# Patient Record
Sex: Male | Born: 1950
Health system: Southern US, Community
[De-identification: ages and names within clinical notes are randomized; demographics above are authoritative.]

## PROBLEM LIST (undated history)

## (undated) DIAGNOSIS — Z8619 Personal history of other infectious and parasitic diseases: Secondary | ICD-10-CM

## (undated) DIAGNOSIS — M109 Gout, unspecified: Secondary | ICD-10-CM

## (undated) DIAGNOSIS — I509 Heart failure, unspecified: Secondary | ICD-10-CM

## (undated) DIAGNOSIS — I10 Essential (primary) hypertension: Secondary | ICD-10-CM

## (undated) DIAGNOSIS — M5030 Other cervical disc degeneration, unspecified cervical region: Secondary | ICD-10-CM

## (undated) DIAGNOSIS — C61 Malignant neoplasm of prostate: Secondary | ICD-10-CM

## (undated) DIAGNOSIS — K5790 Diverticulosis of intestine, part unspecified, without perforation or abscess without bleeding: Secondary | ICD-10-CM

## (undated) DIAGNOSIS — K589 Irritable bowel syndrome without diarrhea: Secondary | ICD-10-CM

## (undated) DIAGNOSIS — J189 Pneumonia, unspecified organism: Secondary | ICD-10-CM

## (undated) DIAGNOSIS — Q159 Congenital malformation of eye, unspecified: Secondary | ICD-10-CM

## (undated) DIAGNOSIS — G4730 Sleep apnea, unspecified: Secondary | ICD-10-CM

## (undated) DIAGNOSIS — M069 Rheumatoid arthritis, unspecified: Secondary | ICD-10-CM

## (undated) DIAGNOSIS — J449 Chronic obstructive pulmonary disease, unspecified: Secondary | ICD-10-CM

## (undated) DIAGNOSIS — R569 Unspecified convulsions: Secondary | ICD-10-CM

## (undated) DIAGNOSIS — J45909 Unspecified asthma, uncomplicated: Secondary | ICD-10-CM

## (undated) DIAGNOSIS — R600 Localized edema: Secondary | ICD-10-CM

## (undated) DIAGNOSIS — M1990 Unspecified osteoarthritis, unspecified site: Secondary | ICD-10-CM

## (undated) DIAGNOSIS — D689 Coagulation defect, unspecified: Secondary | ICD-10-CM

## (undated) DIAGNOSIS — E7800 Pure hypercholesterolemia, unspecified: Secondary | ICD-10-CM

## (undated) DIAGNOSIS — I82409 Acute embolism and thrombosis of unspecified deep veins of unspecified lower extremity: Secondary | ICD-10-CM

## (undated) DIAGNOSIS — R609 Edema, unspecified: Secondary | ICD-10-CM

## (undated) DIAGNOSIS — D126 Benign neoplasm of colon, unspecified: Secondary | ICD-10-CM

## (undated) DIAGNOSIS — F419 Anxiety disorder, unspecified: Secondary | ICD-10-CM

## (undated) DIAGNOSIS — K219 Gastro-esophageal reflux disease without esophagitis: Secondary | ICD-10-CM

## (undated) DIAGNOSIS — R011 Cardiac murmur, unspecified: Secondary | ICD-10-CM

## (undated) DIAGNOSIS — IMO0001 Reserved for inherently not codable concepts without codable children: Secondary | ICD-10-CM

## (undated) HISTORY — DX: Benign neoplasm of colon, unspecified: D12.6

## (undated) HISTORY — DX: Unspecified asthma, uncomplicated: J45.909

## (undated) HISTORY — DX: Coagulation defect, unspecified: D68.9

## (undated) HISTORY — DX: Cardiac murmur, unspecified: R01.1

## (undated) HISTORY — DX: Pure hypercholesterolemia, unspecified: E78.00

## (undated) HISTORY — PX: CIRCUMCISION: SUR203

## (undated) HISTORY — DX: Anxiety disorder, unspecified: F41.9

## (undated) HISTORY — DX: Gout, unspecified: M10.9

## (undated) HISTORY — DX: Malignant neoplasm of prostate: C61

## (undated) HISTORY — DX: Sleep apnea, unspecified: G47.30

## (undated) HISTORY — DX: Rheumatoid arthritis, unspecified: M06.9

## (undated) HISTORY — PX: POLYPECTOMY: SHX149

## (undated) HISTORY — PX: PROSTATE BIOPSY: SHX241

## (undated) HISTORY — PX: COLONOSCOPY: SHX174

## (undated) HISTORY — PX: CHOLECYSTECTOMY: SHX55

---

## 1999-01-07 ENCOUNTER — Emergency Department (HOSPITAL_COMMUNITY): Admission: EM | Admit: 1999-01-07 | Discharge: 1999-01-07 | Payer: Self-pay | Admitting: Emergency Medicine

## 1999-01-18 ENCOUNTER — Encounter: Payer: Self-pay | Admitting: Surgery

## 1999-01-22 ENCOUNTER — Ambulatory Visit (HOSPITAL_COMMUNITY): Admission: RE | Admit: 1999-01-22 | Discharge: 1999-01-23 | Payer: Self-pay | Admitting: Surgery

## 1999-01-22 ENCOUNTER — Encounter: Payer: Self-pay | Admitting: Surgery

## 2000-01-14 ENCOUNTER — Emergency Department (HOSPITAL_COMMUNITY): Admission: EM | Admit: 2000-01-14 | Discharge: 2000-01-14 | Payer: Self-pay | Admitting: Emergency Medicine

## 2000-01-14 ENCOUNTER — Encounter: Payer: Self-pay | Admitting: Emergency Medicine

## 2000-01-31 ENCOUNTER — Other Ambulatory Visit: Admission: RE | Admit: 2000-01-31 | Discharge: 2000-01-31 | Payer: Self-pay | Admitting: Gastroenterology

## 2000-01-31 ENCOUNTER — Encounter (INDEPENDENT_AMBULATORY_CARE_PROVIDER_SITE_OTHER): Payer: Self-pay | Admitting: Specialist

## 2002-01-11 ENCOUNTER — Encounter: Payer: Self-pay | Admitting: Urology

## 2002-01-11 ENCOUNTER — Encounter: Admission: RE | Admit: 2002-01-11 | Discharge: 2002-01-11 | Payer: Self-pay | Admitting: Urology

## 2002-01-13 ENCOUNTER — Ambulatory Visit (HOSPITAL_BASED_OUTPATIENT_CLINIC_OR_DEPARTMENT_OTHER): Admission: RE | Admit: 2002-01-13 | Discharge: 2002-01-13 | Payer: Self-pay | Admitting: Urology

## 2004-11-08 ENCOUNTER — Emergency Department (HOSPITAL_COMMUNITY): Admission: EM | Admit: 2004-11-08 | Discharge: 2004-11-08 | Payer: Self-pay | Admitting: Family Medicine

## 2005-03-04 ENCOUNTER — Ambulatory Visit: Payer: Self-pay | Admitting: Cardiology

## 2005-03-13 ENCOUNTER — Ambulatory Visit (HOSPITAL_COMMUNITY): Admission: RE | Admit: 2005-03-13 | Discharge: 2005-03-13 | Payer: Self-pay | Admitting: Family Medicine

## 2005-03-17 ENCOUNTER — Emergency Department (HOSPITAL_COMMUNITY): Admission: EM | Admit: 2005-03-17 | Discharge: 2005-03-17 | Payer: Self-pay | Admitting: Emergency Medicine

## 2005-04-29 ENCOUNTER — Inpatient Hospital Stay (HOSPITAL_COMMUNITY): Admission: RE | Admit: 2005-04-29 | Discharge: 2005-05-03 | Payer: Self-pay | Admitting: Family Medicine

## 2006-03-20 ENCOUNTER — Emergency Department (HOSPITAL_COMMUNITY): Admission: EM | Admit: 2006-03-20 | Discharge: 2006-03-20 | Payer: Self-pay | Admitting: Emergency Medicine

## 2006-03-24 ENCOUNTER — Ambulatory Visit (HOSPITAL_COMMUNITY): Admission: RE | Admit: 2006-03-24 | Discharge: 2006-03-24 | Payer: Self-pay | Admitting: Family Medicine

## 2007-02-10 ENCOUNTER — Ambulatory Visit (HOSPITAL_COMMUNITY): Admission: RE | Admit: 2007-02-10 | Discharge: 2007-02-10 | Payer: Self-pay | Admitting: *Deleted

## 2007-09-03 ENCOUNTER — Ambulatory Visit (HOSPITAL_COMMUNITY): Admission: RE | Admit: 2007-09-03 | Discharge: 2007-09-03 | Payer: Self-pay | Admitting: Family Medicine

## 2008-03-24 ENCOUNTER — Ambulatory Visit (HOSPITAL_COMMUNITY): Admission: RE | Admit: 2008-03-24 | Discharge: 2008-03-24 | Payer: Self-pay | Admitting: Family Medicine

## 2008-04-29 DIAGNOSIS — D126 Benign neoplasm of colon, unspecified: Secondary | ICD-10-CM

## 2008-04-29 HISTORY — DX: Benign neoplasm of colon, unspecified: D12.6

## 2008-05-03 ENCOUNTER — Ambulatory Visit: Payer: Self-pay | Admitting: Gastroenterology

## 2008-05-09 ENCOUNTER — Encounter: Payer: Self-pay | Admitting: Gastroenterology

## 2008-05-09 ENCOUNTER — Ambulatory Visit: Payer: Self-pay | Admitting: Gastroenterology

## 2008-05-10 ENCOUNTER — Encounter: Payer: Self-pay | Admitting: Gastroenterology

## 2008-11-22 ENCOUNTER — Ambulatory Visit (HOSPITAL_COMMUNITY): Admission: RE | Admit: 2008-11-22 | Discharge: 2008-11-22 | Payer: Self-pay | Admitting: Family Medicine

## 2008-11-28 ENCOUNTER — Ambulatory Visit (HOSPITAL_COMMUNITY): Admission: RE | Admit: 2008-11-28 | Discharge: 2008-11-28 | Payer: Self-pay | Admitting: Internal Medicine

## 2009-04-26 ENCOUNTER — Emergency Department (HOSPITAL_COMMUNITY): Admission: EM | Admit: 2009-04-26 | Discharge: 2009-04-27 | Payer: Self-pay | Admitting: Emergency Medicine

## 2009-05-13 ENCOUNTER — Emergency Department (HOSPITAL_BASED_OUTPATIENT_CLINIC_OR_DEPARTMENT_OTHER): Admission: EM | Admit: 2009-05-13 | Discharge: 2009-05-14 | Payer: Self-pay | Admitting: Emergency Medicine

## 2010-01-09 ENCOUNTER — Ambulatory Visit (HOSPITAL_COMMUNITY): Admission: RE | Admit: 2010-01-09 | Discharge: 2010-01-09 | Payer: Self-pay | Admitting: Family Medicine

## 2010-01-18 ENCOUNTER — Ambulatory Visit (HOSPITAL_COMMUNITY): Admission: RE | Admit: 2010-01-18 | Discharge: 2010-01-18 | Payer: Self-pay | Admitting: Family Medicine

## 2010-04-23 ENCOUNTER — Ambulatory Visit (HOSPITAL_COMMUNITY): Admission: RE | Admit: 2010-04-23 | Discharge: 2010-04-23 | Payer: Self-pay | Admitting: Family Medicine

## 2010-04-23 ENCOUNTER — Encounter: Payer: Self-pay | Admitting: Nurse Practitioner

## 2010-05-03 ENCOUNTER — Encounter: Payer: Self-pay | Admitting: Nurse Practitioner

## 2010-05-04 ENCOUNTER — Encounter: Payer: Self-pay | Admitting: Nurse Practitioner

## 2010-05-05 ENCOUNTER — Emergency Department (HOSPITAL_COMMUNITY): Admission: EM | Admit: 2010-05-05 | Discharge: 2010-05-05 | Payer: Self-pay | Admitting: Emergency Medicine

## 2010-05-08 ENCOUNTER — Ambulatory Visit: Payer: Self-pay | Admitting: Gastroenterology

## 2010-05-08 DIAGNOSIS — R143 Flatulence: Secondary | ICD-10-CM

## 2010-05-08 DIAGNOSIS — R141 Gas pain: Secondary | ICD-10-CM | POA: Insufficient documentation

## 2010-05-08 DIAGNOSIS — R110 Nausea: Secondary | ICD-10-CM | POA: Insufficient documentation

## 2010-05-08 DIAGNOSIS — K219 Gastro-esophageal reflux disease without esophagitis: Secondary | ICD-10-CM | POA: Insufficient documentation

## 2010-05-08 DIAGNOSIS — R142 Eructation: Secondary | ICD-10-CM

## 2010-05-11 ENCOUNTER — Telehealth: Payer: Self-pay | Admitting: Nurse Practitioner

## 2010-05-11 ENCOUNTER — Encounter (INDEPENDENT_AMBULATORY_CARE_PROVIDER_SITE_OTHER): Payer: Self-pay | Admitting: *Deleted

## 2010-05-14 ENCOUNTER — Ambulatory Visit: Payer: Self-pay | Admitting: Gastroenterology

## 2010-05-22 ENCOUNTER — Ambulatory Visit: Payer: Self-pay | Admitting: Gastroenterology

## 2010-07-01 ENCOUNTER — Emergency Department (HOSPITAL_COMMUNITY)
Admission: EM | Admit: 2010-07-01 | Discharge: 2010-07-01 | Payer: Self-pay | Source: Home / Self Care | Admitting: Emergency Medicine

## 2011-01-17 ENCOUNTER — Ambulatory Visit (HOSPITAL_COMMUNITY)
Admission: RE | Admit: 2011-01-17 | Discharge: 2011-01-17 | Payer: Self-pay | Source: Home / Self Care | Attending: Rheumatology | Admitting: Rheumatology

## 2011-01-19 ENCOUNTER — Emergency Department (HOSPITAL_COMMUNITY)
Admission: EM | Admit: 2011-01-19 | Discharge: 2011-01-19 | Payer: Self-pay | Source: Home / Self Care | Admitting: Emergency Medicine

## 2011-01-20 ENCOUNTER — Encounter: Payer: Self-pay | Admitting: Family Medicine

## 2011-01-22 LAB — DIFFERENTIAL
Basophils Absolute: 0.1 10*3/uL (ref 0.0–0.1)
Basophils Relative: 1 % (ref 0–1)
Eosinophils Absolute: 0.1 10*3/uL (ref 0.0–0.7)
Eosinophils Relative: 1 % (ref 0–5)
Lymphocytes Relative: 17 % (ref 12–46)
Lymphs Abs: 2.1 10*3/uL (ref 0.7–4.0)
Monocytes Absolute: 1.1 10*3/uL — ABNORMAL HIGH (ref 0.1–1.0)
Monocytes Relative: 9 % (ref 3–12)
Neutro Abs: 8.9 10*3/uL — ABNORMAL HIGH (ref 1.7–7.7)
Neutrophils Relative %: 72 % (ref 43–77)

## 2011-01-22 LAB — CBC
HCT: 45.4 % (ref 39.0–52.0)
Hemoglobin: 16.1 g/dL (ref 13.0–17.0)
MCH: 31.7 pg (ref 26.0–34.0)
MCHC: 35.5 g/dL (ref 30.0–36.0)
MCV: 89.4 fL (ref 78.0–100.0)
Platelets: 265 10*3/uL (ref 150–400)
RBC: 5.08 MIL/uL (ref 4.22–5.81)
RDW: 13.1 % (ref 11.5–15.5)
WBC: 12.3 10*3/uL — ABNORMAL HIGH (ref 4.0–10.5)

## 2011-01-22 LAB — BASIC METABOLIC PANEL
BUN: 10 mg/dL (ref 6–23)
CO2: 27 mEq/L (ref 19–32)
Calcium: 9.5 mg/dL (ref 8.4–10.5)
Chloride: 102 mEq/L (ref 96–112)
Creatinine, Ser: 0.99 mg/dL (ref 0.4–1.5)
GFR calc Af Amer: >60 mL/min (ref >60)
GFR calc non Af Amer: >60 mL/min (ref >60)
Glucose, Bld: 108 mg/dL — ABNORMAL HIGH (ref 70–99)
Potassium: 4.5 mEq/L (ref 3.5–5.1)
Sodium: 138 mEq/L (ref 135–145)

## 2011-01-22 LAB — PHENYTOIN LEVEL, TOTAL: Phenytoin Lvl: 13.3 ug/mL (ref 10.0–20.0)

## 2011-01-29 NOTE — Letter (Signed)
Summary: Patient Notice- Polyp Results  [facility]  [address] [place], [place] [address]   Phone: [phone]  Fax: [phone]        May 10, 2008 MRN: [id number]    [name] [address] [place], [place]  [address]    Dear [name],  I am pleased to inform you that the colon polyp(s) removed during your recent colonoscopy was (were) found to be benign (no cancer detected) upon pathologic examination.  I recommend you have a repeat colonoscopy examination in 5 years to look for recurrent polyps, as having colon polyps increases your risk for having recurrent polyps or even colon cancer in the future.  Should you develop new or worsening symptoms of abdominal pain, bowel habit changes or bleeding from the rectum or bowels, please schedule an evaluation with either your primary care physician or with me.  Continue treatment plan as outlined the day of your exam.  Please call us if you are having persistent problems or have questions about your condition that have not been fully answered at this time.  Sincerely,  Claudette Head, MD [facility] [place] [facility]  This letter has been electronically signed by your physician.

## 2011-01-29 NOTE — Letter (Signed)
Summary: Kirk Ruths MD  Kirk Ruths MD   Imported By: Lester [place] 05/14/2010 07:50:42  _____________________________________________________________________  External Attachment:    Type:   Image     Comment:   External Document

## 2011-01-29 NOTE — Procedures (Signed)
Summary: Upper Endoscopy  Patient: [name] Note: All result statuses are Final unless otherwise noted.  Tests: (1) Upper Endoscopy (EGD)   EGD Upper Endoscopy       DONE (C)     [facility]     [address]     [place], [place]  [address]           ENDOSCOPY PROCEDURE REPORT           PATIENT:  Mark, [name]  MR#:  [id number]     BIRTHDATE:  1951/11/06, 58 yrs. old  GENDER:  male     ENDOSCOPIST:  Judie Petit T. Russella Dar, MD, [facility]           PROCEDURE DATE:  05/22/2010     PROCEDURE:  EGD, diagnostic     ASA CLASS:  Class II     INDICATIONS:  nausea, GERD     MEDICATIONS:  Fentanyl 75 mcg IV, Versed 10 mg IV     TOPICAL ANESTHETIC:  Exactacain Spray     DESCRIPTION OF PROCEDURE:   After the risks benefits and     alternatives of the procedure were thoroughly explained, informed     consent was obtained.  The LB GIF-H180 [id number] endoscope was     introduced through the mouth and advanced to the second portion of     the duodenum, without limitations.  The instrument was slowly     withdrawn as the mucosa was fully examined.     <<PROCEDUREIMAGES>>     The esophagus and gastroesophageal junction were completely normal     in appearance.  The stomach was entered and closely examined. The     pylorus, antrum, angularis, and lesser curvature were well     visualized, including a retroflexed view of the cardia and fundus.     The stomach wall was normally distensable. The scope passed easily     through the pylorus into the duodenum. The duodenal bulb was     normal in appearance, as was the postbulbar duodenum.     Retroflexed views revealed no abnormalities.  The scope was then     withdrawn from the patient and the procedure completed.     COMPLICATIONS:  None           ENDOSCOPIC IMPRESSION:     1) Normal EGD           RECOMMENDATIONS:     1) Anti-reflux regimen     2) continue PPI           CC: Elfredia Nevins, MD           Venita Lick. Russella Dar, MD, [facility]             n.     REVISED:  05/22/2010 04:26 PM     eSIGNED:   Venita Lick. Stark at 05/22/2010 04:26 PM           Crista Curb, [id number]  Note: An exclamation mark (!) indicates a result that was not dispersed into the flowsheet. Document Creation Date: 05/22/2010 4:27 PM _______________________________________________________________________  (1) Order result status: Final Collection or observation date-time: 05/22/2010 15:59 Requested date-time:  Receipt date-time:  Reported date-time:  Referring Physician:   Ordering Physician: Claudette Head [phone]) Specimen Source:  Source: Launa Grill Order Number: [id number] Lab site:

## 2011-01-29 NOTE — Letter (Signed)
Summary: EGD Instructions  [facility]  [address] [place], [place] [address]   Phone: [phone]  Fax: [phone]       [name]    [birth year]    MRN: [id number]       Procedure Day [name]:  [name]  05/22/10     Arrival Time:  2:30pm     Procedure Time:  3:30pm    Location of Procedure:                    _ X _ [facility] (4th Floor)    PREPARATION FOR ENDOSCOPY   On TUESDAY 05/24,  THE DAY OF THE PROCEDURE:  1.   No solid foods, milk or milk products are allowed after midnight the night before your procedure.  2.   Do not drink anything colored red or purple.  Avoid juices with pulp.  No orange juice.  3.  You may drink clear liquids until  1:30pm which is 2 hours before your procedure.                                                                                                CLEAR LIQUIDS INCLUDE: Water Jello Ice Popsicles Tea (sugar ok, no milk/cream) Powdered fruit flavored drinks Coffee (sugar ok, no milk/cream) Gatorade Juice: apple, white grape, white cranberry  Lemonade Clear bullion, consomm, broth Carbonated beverages (any kind) Strained chicken noodle soup Hard Candy   MEDICATION INSTRUCTIONS  Unless otherwise instructed, you should take regular prescription medications with a small sip of water as early as possible the morning of your procedure.             OTHER INSTRUCTIONS  You will need a responsible adult at least 61 years of age to accompany you and drive you home.   This person must remain in the waiting room during your procedure.  Wear loose fitting clothing that is easily removed.  Leave jewelry and other valuables at home.  However, you may wish to bring a book to read or an iPod/MP3 player to listen to music as you wait for your procedure to start.  Remove all body piercing jewelry and leave at home.  Total time from sign-in until discharge is approximately 2-3 hours.  You should  go home directly after your procedure and rest.  You can resume normal activities the day after your procedure.  The day of your procedure you should not:   Drive   Make legal decisions   Operate machinery   Drink alcohol   Return to work  You will receive specific instructions about eating, activities and medications before you leave.    The above instructions have been reviewed and explained to me by   Wyona Almas RN  May 14, 2010 8:34 AM     I fully understand and can verbalize these instructions _____________________________ Date _________

## 2011-01-29 NOTE — Procedures (Signed)
Summary: Colonoscopy   Colonoscopy  Procedure date:  05/09/2008  Findings:      Location:  [facility].    Procedures Next Due Date:    Colonoscopy: 04/2013  Patient Name: [name], [name] MRN:  Procedure Procedures: Colonoscopy CPT: 64403.    with polypectomy. CPT: A3573898.  Personnel: Endoscopist: Venita Lick. Russella Dar, MD, Clementeen Graham.  Referred By: Patrica Duel, MD.  Exam Location: Exam performed in Outpatient Clinic. Outpatient  Patient Consent: Procedure, Alternatives, Risks and Benefits discussed, consent obtained, from patient. Consent was obtained by the RN.  Indications Symptoms: Constipation Hematochezia. Abdominal pain / bloating.  History  Current Medications: Patient is not currently taking Coumadin.  Pre-Exam Physical: Performed May 09, 2008. Cardio-pulmonary exam, Rectal exam, HEENT exam , Abdominal exam, Mental status exam WNL.  Comments: Pt. history reviewed/updated, physical exam performed prior to initiation of sedation?Yes Exam Exam: Extent of exam reached: Cecum, extent intended: Cecum.  The cecum was identified by appendiceal orifice and IC valve. Time to Cecum: 00:01: 50. Time for Withdrawl: 00:10:02. Colon retroflexion performed. ASA Classification: II. Tolerance: excellent.  Monitoring: Pulse and BP monitoring, Oximetry used. Supplemental O2 given.  Colon Prep Used MoviPrep for colon prep. Prep results: excellent.  Sedation Meds: Patient assessed and found to be appropriate for moderate (conscious) sedation. Fentanyl 75 mcg. given IV. Versed 6 mg. given IV.  Findings - DIVERTICULOSIS: Descending Colon to Sigmoid Colon. Not bleeding. ICD9: Diverticulosis: 562.10.  NORMAL EXAM: Cecum to Splenic Flexure.  POLYP: Sigmoid Colon, Maximum size: 5 mm. sessile polyp. Procedure:  snare without cautery, removed, retrieved, Polyp sent to pathology. ICD9: Colon Polyps: 211.3.  HEMORRHOIDS: Internal. Size: Small. Not bleeding. Not  thrombosed. ICD9: Hemorrhoids, Internal: 455.0.   Assessment  Diagnoses: 562.10: Diverticulosis.  211.3: Colon Polyps.  455.0: Hemorrhoids, Internal.   Events  Unplanned Interventions: No intervention was required.  Unplanned Events: There were no complications. Plans  Post Exam Instructions: Post sedation instructions given.  Medication Plan: Await pathology. Antispasmodics: Dicyclomine QID prn,  Hemorrhoidal Medications: Anusol Suppositories  prn,   Patient Education: Patient given standard instructions for: Polyps. Diverticulosis. Hemorrhoids.  IBS.  Disposition: After procedure patient sent to recovery. After recovery patient sent home.  Scheduling/Referral: Colonoscopy, to [facility] T. Russella Dar, MD, [facility], if polyp adenomatous, around May 09, 2013.  Colonoscopy, to [facility] T. Russella Dar, MD, [facility], if polyp not neoplastic, around May 09, 2018.  May 10, 2008 MRN: [id number]  REPORT OF SURGICAL PATHOLOGY   Case #: [id number] Patient Name: [name]. Office Chart Number:  [id number]   MRN: [id number] Pathologist: Beulah Gandy. Luisa Hart, MD DOB/Age  [birth year] (Age: 60)    Gender: M Date Taken:  05/09/2008 Date Received: 05/09/2008   FINAL DIAGNOSIS   ***MICROSCOPIC EXAMINATION AND DIAGNOSIS***   COLON, SIGMOID POLYP:  TUBULAR ADENOMA.  NO HIGH GRADE DYSPLASIA OR MALIGNANCY IDENTIFIED.   jy Date Reported:  05/10/2008     Beulah Gandy. Luisa Hart, MD *** Electronically Signed Out By JDP ***     May 10, 2008 MRN: [id number]    [name] [address] [place], [place]  [address]    Dear [name],  I am pleased to inform you that the colon polyp(s) removed during your recent colonoscopy was (were) found to be benign (no cancer detected) upon pathologic examination.  I recommend you have a repeat colonoscopy examination in 5 years to look for recurrent polyps, as having colon polyps increases your risk for having recurrent polyps or even colon cancer in the future.  Should  you develop new or worsening symptoms of abdominal pain, bowel habit changes or bleeding from the rectum or bowels, please schedule an evaluation with either your primary care physician or with me.  Continue treatment plan as outlined the day of your exam.  Please call us if you are having persistent problems or have questions about your condition that have not been fully answered at this time.  Sincerely,  Claudette Head, MD [facility]  This letter has been electronically signed by your physician.  This report was created from the original endoscopy report, which was reviewed and signed the above listed endoscopist.

## 2011-01-29 NOTE — Miscellaneous (Signed)
Summary: Rx for Movi prep  Clinical Lists Changes  Medications: Added new medication of MOVIPREP 100 GM  SOLR (PEG-KCL-NACL-NASULF-NA ASC-C) As per prep instructions. - Signed Rx of MOVIPREP 100 GM  SOLR (PEG-KCL-NACL-NASULF-NA ASC-C) As per prep instructions.;  #1 x 0;  Signed;  Entered by: Christie Nottingham CMA;  Authorized by: Meryl Dare MD;  Method used: Electronic Allergies: Added new allergy or adverse reaction of KEFLEX Added new allergy or adverse reaction of TEGRETOL Observations: Added new observation of NKA: F (05/03/2008 9:52)    Prescriptions: MOVIPREP 100 GM  SOLR (PEG-KCL-NACL-NASULF-NA ASC-C) As per prep instructions.  #1 x 0   Entered by:   Christie Nottingham CMA   Authorized by:   Meryl Dare MD   Signed by:   Christie Nottingham CMA on 05/03/2008   Method used:   Electron[facility] sent to ...       [facility]  [address] [id number]       [address]       [place], [place]  [address]       Ph: [phone] or [phone]       Fax: [phone]   RxID:   [id number]

## 2011-01-29 NOTE — Miscellaneous (Signed)
Summary: [facility] Previsit/prep  Clinical Lists Changes  Allergies: Added new allergy or adverse reaction of LEVAQUIN Added new allergy or adverse reaction of CELEBREX Changed allergy or adverse reaction from TEGRETOL to TEGRETOL Changed allergy or adverse reaction from [facility] to [facility]

## 2011-01-29 NOTE — Assessment & Plan Note (Signed)
Summary: nausea/bloating/diarrhea/sheri   History of Present Illness Visit Type: Follow-up Visit Primary GI MD: Mark Goody MD [facility] Primary Provider: Elfredia Nevins, MD Requesting Provider: Elfredia Nevins, MD Chief Complaint: nausea, abdominal bloating, and diarrhea after taking Levaquin for pneumonia History of Present Illness:   Patient is a 60 year old male followed by [name] for history GERD, diverticulosis and IBS-C associated with left lower quadrant pain and bloating. Patient here today with brother and sister. Gives eight day history of bloating and nausea. No diarrhea but BMs have increased from once every 3-4 days to daily over last eight days. On Easter patient given a week of Levaquin for PNA. GI symptoms started after that. Saw PCP on 5/5 ( reviewed records). Stool studies obtained, I don't have results. Given Flaygl for presumed C-Difficile. Labs revealed CRP of 14.8 but Rheumatoid factor also elevated. Meloxicam and ASA stopped. No improvement after two days of Flagyl, went to [facility] ER. CBC, CMET, Lipase, U/A normal. CT Scan of abd/ pelvis (with IV) was negative for any acute processes.    GI Review of Systems    Reports bloating, loss of appetite, and  nausea.      Denies abdominal pain, acid reflux, belching, chest pain, dysphagia with liquids, dysphagia with solids, heartburn, vomiting, vomiting blood, weight loss, and  weight gain.        Denies anal fissure, black tarry stools, change in bowel habit, constipation, diarrhea, diverticulosis, fecal incontinence, heme positive stool, hemorrhoids, irritable bowel syndrome, jaundice, light color stool, liver problems, rectal bleeding, and  rectal pain. Preventive Screening-Counseling & Management  Alcohol-Tobacco     Smoking Status: quit      Drug Use:  no.      Current Medications (verified): 1)  Nexium 40 Mg Cpdr (Esomeprazole Magnesium) .[name].. 1 Capsule By Mouth Once Daily 2)  Dicyclomine Hcl 10 Mg Caps  (Dicyclomine Hcl) .[name].. 1 By Mouth Two Times A Day 3)  Dilantin 100 Mg Caps (Phenytoin Sodium Extended) .[name].. 1 By Mouth Once and 2 Tablets By Mouth At Bedtime 4)  Ondansetron 4 Mg Tbdp (Ondansetron) .[name].. 1 By Mouth As Needed Nausea 5)  Metronidazole 500 Mg Tabs (Metronidazole) .[name].. 1 By Mouth Three Times A Day  Allergies (verified): 1)  ! Keflex 2)  ! Tegretol  Past History:  Past Medical History: RA Diverticulosis Seizure Disorder Sleep Apnea Irritable Bowel Syndrome Hx of DVT lt. leg GERD  Past Surgical History: Cholecystectomy  Family History: Reviewed history and no changes required. No FH of Colon Cancer: Family History of Stomach Cancer:father Bladder Ca.-sister Throat Ca-brother Skin Ca.-Father  Social History: Reviewed history and no changes required. Occupation: Curator Patient is a former smoker.  Alcohol Use - no Daily Caffeine Use Illicit Drug Use - no Smoking Status:  quit Drug Use:  no  Review of Systems       The patient complains of arthritis/joint pain, back pain, sore throat, and swelling of feet/legs.  The patient denies allergy/sinus, anemia, anxiety-new, blood in urine, breast changes/lumps, change in vision, confusion, cough, coughing up blood, depression-new, fainting, fatigue, fever, headaches-new, hearing problems, heart murmur, heart rhythm changes, itching, muscle pains/cramps, night sweats, nosebleeds, shortness of breath, skin rash, sleeping problems, swollen lymph glands, thirst - excessive, urination - excessive, urination changes/pain, urine leakage, vision changes, and voice change.    Vital Signs:  Patient profile:   60 year old male Height:      69.5 inches Weight:      200.25  pounds BMI:     29.25 Pulse rate:   80 / minute Pulse rhythm:   regular BP sitting:   130 / 88  (left arm)  Vitals Entered By: [name] NCMA (May 08, 2010 9:19 AM)  Physical Exam  General:  Well developed, well nourished, no acute  distress. Head:  Normocephalic and atraumatic. Eyes:  Conjunctiva pink, no icterus.  Mouth:  No oral lesions. Tongue moist.  Neck:  no obvious masses  Lungs:  Clear throughout to auscultation. Heart:  Regular rate and rhythm; no murmurs, rubs,  or bruits. Abdomen:  Abdomen soft, nontender, nondistended. No obvious masses or hepatomegaly.Normal bowel sounds.  Extremities:  2+ LLE edema.  Neurologic:  Alert and  oriented x4;  grossly normal neurologically. Skin:  Intact without significant lesions or rashes. Cervical Nodes:  No significant cervical adenopathy. Psych:  Alert and cooperative. Normal mood and affect.   Impression & Recommendations:  Problem # 1:  NAUSEA (ICD-787.02) Assessment Deteriorated Eight day history of nausea and bloating after taking a week of Levaquin for PNA. No diarrhea but stool frequency increased from once every 3-4 days to once daily. On 5th day of Flagyl ( given by PCP). Will obtain results of stool studies. Recent labs and CTscan unremarkable. Never had diarrhea so will stop Flagyl as it may be causing ongoing nausea. Cannot exclude erosive gastritis / PUD from recent Meloxicam and ASA. For now, increase PPI to twice daily. [employer]. Continue to hold ASA and Meloxicam. Patient feels he is starting to improve. He will call us later in week and if not better will likely need EGD.    Problem # 2:  GERD (ICD-530.81) Assessment: Comment Only Controlled on PPI  Patient Instructions: 1)  Take Align samples 1 capsule once daily. 2)  Stop the Flagyl. 3)  We have given you a low fat diet brochure. 4)  Increase the Nexium to twice daily. 5)  Call us on Friday with a progress report. 6)  Copy sent to : Mark Nevins, MD 7)  The medication list was reviewed and reconciled.  All changed / newly prescribed medications were explained.  A complete medication list was provided to the patient / caregiver.

## 2011-01-29 NOTE — Progress Notes (Signed)
Summary: Triage  Phone Note Call from Patient Call back at Home Phone [phone]   Caller: Patient Call For: Dr. Russella Dar Reason for Call: Talk to Nurse Summary of Call: Condition update: pt said he is still nauseated espicially first thing in the morning Initial call taken by: Karna Christmas,  May 11, 2010 10:54 AM  Follow-up for Phone Call        Willette Cluster RNP please advise what you want to do from here. Follow-up by: Darcey Nora RN, CGRN,  May 11, 2010 11:19 AM  Additional Follow-up for Phone Call Additional follow up Details #1::        spoke to paula and she adivsed if the patient was doing all instructions advised to him at the office visit and he was still having nausea then he needed to have a EGD. Patient is doing all of the instuctions advised at the office visit except taking his zofran. I advised him to take zofran as needed he will need a refill. I have sent him a refill but advised him to come for his procedure.  Additional Follow-up by: Harlow Mares CMA Duncan Dull),  May 11, 2010 4:44 PM    New/Updated Medications: NEXIUM 40 MG CPDR (ESOMEPRAZOLE MAGNESIUM) 1 capsule by mouth two times a day DICYCLOMINE HCL 10 MG CAPS (DICYCLOMINE HCL) 1 by mouth two three times a day Prescriptions: ONDANSETRON 4 MG TBDP (ONDANSETRON) 1 by mouth as needed nausea  #15 x 0   Entered by:   Harlow Mares CMA (AAMA)   Authorized by:   Willette Cluster NP   Signed by:   Harlow Mares CMA (AAMA) on 05/11/2010   Method used:   [facility]  [address] [id number]       [address]       [place], [place]  [address]       Ph: [phone] or [phone]       Fax: [phone]   RxID:   [id number]

## 2011-02-18 ENCOUNTER — Inpatient Hospital Stay (HOSPITAL_COMMUNITY)
Admission: EM | Admit: 2011-02-18 | Discharge: 2011-02-19 | DRG: 131 | Disposition: A | Discharge status: Home / Self Care | Payer: BC Managed Care – PPO | Attending: Internal Medicine | Admitting: Internal Medicine

## 2011-02-18 ENCOUNTER — Ambulatory Visit (HOSPITAL_COMMUNITY)
Admission: AD | Admit: 2011-02-18 | Discharge: 2011-02-18 | Disposition: A | Discharge status: Home / Self Care | Payer: BC Managed Care – PPO | Source: Ambulatory Visit | Attending: Family Medicine | Admitting: Family Medicine

## 2011-02-18 ENCOUNTER — Emergency Department (HOSPITAL_COMMUNITY): Payer: BC Managed Care – PPO

## 2011-02-18 ENCOUNTER — Other Ambulatory Visit (HOSPITAL_COMMUNITY): Payer: Self-pay | Admitting: Family Medicine

## 2011-02-18 DIAGNOSIS — G40909 Epilepsy, unspecified, not intractable, without status epilepticus: Secondary | ICD-10-CM | POA: Diagnosis present

## 2011-02-18 DIAGNOSIS — M069 Rheumatoid arthritis, unspecified: Secondary | ICD-10-CM | POA: Diagnosis present

## 2011-02-18 DIAGNOSIS — I824Y9 Acute embolism and thrombosis of unspecified deep veins of unspecified proximal lower extremity: Principal | ICD-10-CM | POA: Diagnosis present

## 2011-02-18 DIAGNOSIS — Z86718 Personal history of other venous thrombosis and embolism: Secondary | ICD-10-CM

## 2011-02-18 DIAGNOSIS — K589 Irritable bowel syndrome without diarrhea: Secondary | ICD-10-CM | POA: Diagnosis present

## 2011-02-18 DIAGNOSIS — R609 Edema, unspecified: Secondary | ICD-10-CM

## 2011-02-18 LAB — URINALYSIS, ROUTINE W REFLEX MICROSCOPIC
Bilirubin Urine: NEGATIVE
Hgb urine dipstick: NEGATIVE
Ketones, ur: NEGATIVE mg/dL
Nitrite: NEGATIVE
Protein, ur: NEGATIVE mg/dL
Specific Gravity, Urine: 1.025 (ref 1.005–1.030)
Urine Glucose, Fasting: NEGATIVE mg/dL
Urobilinogen, UA: 0.2 mg/dL (ref 0.0–1.0)
pH: 6 (ref 5.0–8.0)

## 2011-02-18 LAB — DIFFERENTIAL
Basophils Absolute: 0.1 10*3/uL (ref 0.0–0.1)
Basophils Absolute: 0.1 10*3/uL (ref 0.0–0.1)
Basophils Relative: 1 % (ref 0–1)
Basophils Relative: 1 % (ref 0–1)
Eosinophils Absolute: 0.4 10*3/uL (ref 0.0–0.7)
Eosinophils Absolute: 0.3 10*3/uL (ref 0.0–0.7)
Eosinophils Relative: 3 % (ref 0–5)
Eosinophils Relative: 2 % (ref 0–5)
Lymphocytes Relative: 23 % (ref 12–46)
Lymphocytes Relative: 19 % (ref 12–46)
Lymphs Abs: 2.4 10*3/uL (ref 0.7–4.0)
Lymphs Abs: 2.4 10*3/uL (ref 0.7–4.0)
Monocytes Absolute: 0.8 10*3/uL (ref 0.1–1.0)
Monocytes Absolute: 0.9 10*3/uL (ref 0.1–1.0)
Monocytes Relative: 7 % (ref 3–12)
Monocytes Relative: 7 % (ref 3–12)
Neutro Abs: 6.7 10*3/uL (ref 1.7–7.7)
Neutro Abs: 8.9 10*3/uL — ABNORMAL HIGH (ref 1.7–7.7)
Neutrophils Relative %: 65 % (ref 43–77)
Neutrophils Relative %: 71 % (ref 43–77)

## 2011-02-18 LAB — CBC
HCT: 46.3 % (ref 39.0–52.0)
HCT: 46.5 % (ref 39.0–52.0)
Hemoglobin: 15 g/dL (ref 13.0–17.0)
Hemoglobin: 15.4 g/dL (ref 13.0–17.0)
MCH: 30.4 pg (ref 26.0–34.0)
MCH: 30.9 pg (ref 26.0–34.0)
MCHC: 32.4 g/dL (ref 30.0–36.0)
MCHC: 33.1 g/dL (ref 30.0–36.0)
MCV: 93.7 fL (ref 78.0–100.0)
MCV: 93.2 fL (ref 78.0–100.0)
Platelets: 299 10*3/uL (ref 150–400)
Platelets: 281 10*3/uL (ref 150–400)
RBC: 4.94 MIL/uL (ref 4.22–5.81)
RBC: 4.99 MIL/uL (ref 4.22–5.81)
RDW: 13.8 % (ref 11.5–15.5)
RDW: 13.6 % (ref 11.5–15.5)
WBC: 10.2 10*3/uL (ref 4.0–10.5)
WBC: 12.5 10*3/uL — ABNORMAL HIGH (ref 4.0–10.5)

## 2011-02-18 LAB — PROTIME-INR
INR: 0.92 (ref 0.00–1.49)
Prothrombin Time: 12.6 seconds (ref 11.6–15.2)

## 2011-02-18 LAB — APTT: aPTT: 29 seconds (ref 24–37)

## 2011-02-19 LAB — DIFFERENTIAL
Basophils Absolute: 0.1 10*3/uL (ref 0.0–0.1)
Basophils Relative: 1 % (ref 0–1)
Eosinophils Absolute: 0.5 10*3/uL (ref 0.0–0.7)
Eosinophils Relative: 5 % (ref 0–5)
Lymphocytes Relative: 31 % (ref 12–46)
Lymphs Abs: 3 10*3/uL (ref 0.7–4.0)
Monocytes Absolute: 0.8 10*3/uL (ref 0.1–1.0)
Monocytes Relative: 8 % (ref 3–12)
Neutro Abs: 5.4 10*3/uL (ref 1.7–7.7)
Neutrophils Relative %: 55 % (ref 43–77)

## 2011-02-19 LAB — CBC
HCT: 45.6 % (ref 39.0–52.0)
Hemoglobin: 14.8 g/dL (ref 13.0–17.0)
MCH: 30.1 pg (ref 26.0–34.0)
MCHC: 32.5 g/dL (ref 30.0–36.0)
MCV: 92.7 fL (ref 78.0–100.0)
Platelets: 288 10*3/uL (ref 150–400)
RBC: 4.92 MIL/uL (ref 4.22–5.81)
RDW: 13.5 % (ref 11.5–15.5)
WBC: 9.7 10*3/uL (ref 4.0–10.5)

## 2011-02-19 LAB — PROTIME-INR
INR: 1.19 (ref 0.00–1.49)
Prothrombin Time: 15.3 seconds — ABNORMAL HIGH (ref 11.6–15.2)

## 2011-02-24 NOTE — H&P (Addendum)
NAMECOURTEZ, TWADDLE              ACCOUNT NO.:  [id number]  MEDICAL RECORD NO.:  [id number]           PATIENT TYPE:  O  LOCATION:  XRAY                          FACILITY:  [facility]  PHYSICIAN:  Lameisha Schuenemann L. Lendell Caprice, MDDATE OF BIRTH:  01-14-1951  DATE OF ADMISSION:  02/18/2011 DATE OF DISCHARGE:  LH                             HISTORY & PHYSICAL   CHIEF COMPLAINT:  Right leg swelling and pain.  HISTORY OF PRESENT ILLNESS:  Mr. Bihm is a pleasant 60 year old white male with a history of previous DVT who presents with a 3-week history of right leg swelling, pain, and redness.  He has a history of rheumatoid arthritis and thought it might be a flare of his arthritis. He went to see Dr. Corliss Skains who changed his methotrexate to Enbrel. Subsequently, he went to see his primary care physician, Dr. Renette Butters.  An outpatient Doppler was ordered which showed a DVT.  He was sent to the emergency room and is being admitted to the hospitalist service.  He has no shortness of breath, no chest pain, no palpitations.  He was on Coumadin for 6 months in 2006 for a left leg DVT.  He has no history of bleeding ulcers.  PAST MEDICAL HISTORY: 1. Seizure disorder which has been stable for many years. 2. Irritable bowel syndrome. 3. Previous DVT. 4. Rheumatoid arthritis, diagnosed about 6 months ago.  MEDICATIONS: 1. Dilantin 100 mg p.o. q.a.m., 200 mg p.o. nightly. 2. Nexium 20 mg nightly. 3. Dicyclomine 10 mg p.o. b.i.d. 4. Enbrel weekly.  He is due for his injection today. 5. Tylenol as needed.  He is allergic to TEGRETOL, CEPHALEXIN, and CLINDAMYCIN.  SOCIAL HISTORY:  He does not drink, smoke, or use drugs.  Mother had Alzheimer disease.  Father had stomach and skin cancer.  REVIEW OF SYSTEMS:  Systems reviewed and as above, otherwise negative.  PHYSICAL EXAMINATION:  VITAL SIGNS:  Temperature is 98.3, blood pressure 140/94, pulse 79, respiratory rate 20, oxygen saturation 95% on  room air. GENERAL:  The patient is well nourished, well developed, in no acute distress. HEENT:  Pupils equal, round, reactive to light.  Sclerae nonicteric. Moist mucous membranes. NECK:  Supple.  No lymphadenopathy.  No JVD. LUNGS:  Clear to auscultation bilaterally without wheezes, rhonchi, or rales. CARDIOVASCULAR:  Regular rate and rhythm without murmurs, gallops, or rubs. ABDOMEN:  Normal bowel sounds, soft, nontender, nondistended. GENITOURINARY AND RECTAL:  Deferred. EXTREMITIES:  He has right leg swelling with erythema, warmth, and tenderness.  NEUROLOGIC:  Alert and oriented.  Cranial nerves and sensorimotor exam are intact. PSYCHIATRIC:  Normal affect. SKIN:  As above.  No other significant findings.  His INR is 0.92, PTT is 29.  White blood cell count is 10,000 and the rest of his CBC is unremarkable.  Ultrasound of the right leg shows acute thrombus extending from the right common femoral vein into the right profunda femoral, superficial femoral, popliteal, and proximal tibial veins. EKG shows normal sinus rhythm.  ASSESSMENT AND PLAN: 1. Acute deep venous thrombosis.  This is the patient's second event,     and he will need indefinite Coumadin  therapy.  I believe he is due     for his next screening colonoscopy, and we will need to rule out     occult malignancy which would need to be done as an outpatient.     The patient wishes to go home after home Lovenox and Coumadin is     set up.  His chest x-ray done in the ER shows no mass.  It would be     reasonable to do a PSA, but he may have that in the office.  I will     defer to his primary care physician for any further workup. 2. Seizure disorder. 3. Irritable bowel syndrome. 4. Rheumatoid arthritis.  We will proceed with Enbrel injection today.     Darcie Mellone L. Lendell Caprice, MD     CLS/MEDQ  D:  02/19/2011  T:  02/20/2011  Job:  [id number]  cc:   Corrie Mckusick, M.D. Fax: [phone]  Electronically Signed by  Crista Curb MD on 02/24/2011 09:28:44 PM

## 2011-02-24 NOTE — Discharge Summary (Signed)
  NAMEHUGH, [name]              ACCOUNT NO.:  [id number]  MEDICAL RECORD NO.:  [id number]           PATIENT TYPE:  I  LOCATION:  A328                          FACILITY:  [facility]  PHYSICIAN:  [name], MDDATE OF BIRTH:  [birth year]  DATE OF ADMISSION:  02/18/2011 DATE OF DISCHARGE:  02/21/2012LH                              DISCHARGE SUMMARY   DISCHARGE DIAGNOSES: 1. Acute right leg deep vein thrombosis. 2. Rheumatoid arthritis. 3. Previous history of left leg deep vein thrombosis in 2006. 4. History of seizures. 5. Irritable bowel syndrome.  DISCHARGE MEDICATIONS: 1. Lovenox 150 mg subcutaneously daily for 3 more days or until INR is     greater than 2.0. 2. Warfarin 5 mg nightly or as directed by Dr. Phillips [name] to keep INR     between 2 and 3. 3. Dicyclomine 10 mg 2 tablets twice a day. 4. Dilantin 100 mg in the morning, 200 mg in the evening. 5. Enbrel 50 mg subcutaneously weekly. 6. Nexium 40 mg a day. 7. Tylenol 500 mg p.o. q.4 hours p.r.n. pain.  CONDITION:  Stable.  ACTIVITY:  Ad lib.  Keep leg elevated when resting or sleeping.  Return to work on Monday.  DIET:  Should be Coumadin compatible.  Follow up in Dr. Lamar [name] office this Friday to get an INR check before 11 a.m.  Follow up with Dr. Phillips [name] next week.  CONSULTATIONS:  None.  PROCEDURES:  None.  LABS:  CBC unremarkable on admission.  PTT normal.  INR 0.92.  At discharge, INR is 1.19.  Urinalysis negative.  DIAGNOSTICS:  Ultrasound of the right leg shows a DVT from the right common femoral vein inferiorly through the right profunda femoral, superficial femoral, popliteal and proximal tibial veins.  Chest x-ray two views showed nothing acute.  EKG showed normal sinus rhythm.  HISTORY AND HOSPITAL COURSE:  Please see H and P for details.  [name] is a pleasant 60 year old white male with previous history of DVT who had an outpatient Doppler which showed an acute DVT.  He was sent to the  emergency room and subsequently admitted to the Hospitalist Service. He had a colonoscopy 10 years ago and is due for another one.  I will recommend that he remain on Coumadin for 3 months before even considering coming off Coumadin for colonoscopy.  Also, consider prostate screening, defer to his primary care physician.  He got Lovenox teaching.  His leg was indurated, erythematous, tender.  I have spoken with Dr. Lamar [name] office to coordinate hand off and Coumadin management.  The patient is requesting discharge and so he will have a 5- day total Coumadin and Lovenox bridge or until his Coumadin is therapeutic.     Mark Maxcy L. Lendell Caprice, MD     CLS/MEDQ  D:  02/19/2011  T:  02/20/2011  Job:  [id number]  cc:   [name], M.D. Fax: [phone]  Electronically Signed by Mark Curb MD on 02/24/2011 09:28:34 PM

## 2011-03-04 ENCOUNTER — Other Ambulatory Visit (HOSPITAL_COMMUNITY): Payer: Self-pay | Admitting: Cardiovascular Disease

## 2011-03-04 DIAGNOSIS — Z09 Encounter for follow-up examination after completed treatment for conditions other than malignant neoplasm: Secondary | ICD-10-CM

## 2011-03-04 DIAGNOSIS — R609 Edema, unspecified: Secondary | ICD-10-CM

## 2011-03-07 ENCOUNTER — Ambulatory Visit (HOSPITAL_COMMUNITY)
Admission: RE | Admit: 2011-03-07 | Discharge: 2011-03-07 | Disposition: A | Discharge status: Home / Self Care | Payer: BC Managed Care – PPO | Source: Ambulatory Visit | Attending: Cardiovascular Disease | Admitting: Cardiovascular Disease

## 2011-03-07 DIAGNOSIS — Z09 Encounter for follow-up examination after completed treatment for conditions other than malignant neoplasm: Secondary | ICD-10-CM

## 2011-03-07 DIAGNOSIS — M7989 Other specified soft tissue disorders: Secondary | ICD-10-CM | POA: Insufficient documentation

## 2011-03-07 DIAGNOSIS — I82819 Embolism and thrombosis of superficial veins of unspecified lower extremities: Secondary | ICD-10-CM | POA: Insufficient documentation

## 2011-03-07 DIAGNOSIS — R609 Edema, unspecified: Secondary | ICD-10-CM

## 2011-03-07 HISTORY — PX: OTHER SURGICAL HISTORY: SHX169

## 2011-03-15 ENCOUNTER — Emergency Department (HOSPITAL_COMMUNITY): Payer: BC Managed Care – PPO

## 2011-03-15 ENCOUNTER — Inpatient Hospital Stay (HOSPITAL_COMMUNITY)
Admission: EM | Admit: 2011-03-15 | Discharge: 2011-03-18 | DRG: 321 | Disposition: A | Discharge status: Home / Self Care | Payer: BC Managed Care – PPO | Attending: Internal Medicine | Admitting: Internal Medicine

## 2011-03-15 DIAGNOSIS — Z86718 Personal history of other venous thrombosis and embolism: Secondary | ICD-10-CM

## 2011-03-15 DIAGNOSIS — T45515A Adverse effect of anticoagulants, initial encounter: Secondary | ICD-10-CM | POA: Diagnosis present

## 2011-03-15 DIAGNOSIS — R310 Gross hematuria: Secondary | ICD-10-CM | POA: Diagnosis present

## 2011-03-15 DIAGNOSIS — K589 Irritable bowel syndrome without diarrhea: Secondary | ICD-10-CM | POA: Diagnosis present

## 2011-03-15 DIAGNOSIS — G40909 Epilepsy, unspecified, not intractable, without status epilepticus: Secondary | ICD-10-CM | POA: Diagnosis present

## 2011-03-15 DIAGNOSIS — N390 Urinary tract infection, site not specified: Principal | ICD-10-CM | POA: Diagnosis present

## 2011-03-15 DIAGNOSIS — M069 Rheumatoid arthritis, unspecified: Secondary | ICD-10-CM | POA: Diagnosis present

## 2011-03-15 DIAGNOSIS — Z7901 Long term (current) use of anticoagulants: Secondary | ICD-10-CM

## 2011-03-15 LAB — DIFFERENTIAL
Basophils Absolute: 0 10*3/uL (ref 0.0–0.1)
Basophils Absolute: 0 10*3/uL (ref 0.0–0.1)
Basophils Relative: 0 % (ref 0–1)
Basophils Relative: 0 % (ref 0–1)
Eosinophils Absolute: 0.2 10*3/uL (ref 0.0–0.7)
Eosinophils Absolute: 0.1 10*3/uL (ref 0.0–0.7)
Eosinophils Relative: 2 % (ref 0–5)
Eosinophils Relative: 1 % (ref 0–5)
Lymphocytes Relative: 15 % (ref 12–46)
Lymphocytes Relative: 19 % (ref 12–46)
Lymphs Abs: 1.7 10*3/uL (ref 0.7–4.0)
Lymphs Abs: 1.7 10*3/uL (ref 0.7–4.0)
Monocytes Absolute: 1.3 10*3/uL — ABNORMAL HIGH (ref 0.1–1.0)
Monocytes Absolute: 1.5 10*3/uL — ABNORMAL HIGH (ref 0.1–1.0)
Monocytes Relative: 12 % (ref 3–12)
Monocytes Relative: 16 % — ABNORMAL HIGH (ref 3–12)
Neutro Abs: 8.2 10*3/uL — ABNORMAL HIGH (ref 1.7–7.7)
Neutro Abs: 6.1 10*3/uL (ref 1.7–7.7)
Neutrophils Relative %: 72 % (ref 43–77)
Neutrophils Relative %: 64 % (ref 43–77)

## 2011-03-15 LAB — CBC
HCT: 47 % (ref 39.0–52.0)
HCT: 45 % (ref 39.0–52.0)
Hemoglobin: 16.3 g/dL (ref 13.0–17.0)
Hemoglobin: 15.1 g/dL (ref 13.0–17.0)
MCH: 30 pg (ref 26.0–34.0)
MCH: 30.3 pg (ref 26.0–34.0)
MCHC: 34.7 g/dL (ref 30.0–36.0)
MCHC: 33.6 g/dL (ref 30.0–36.0)
MCV: 86.4 fL (ref 78.0–100.0)
MCV: 90.4 fL (ref 78.0–100.0)
Platelets: 242 10*3/uL (ref 150–400)
Platelets: 208 10*3/uL (ref 150–400)
RBC: 5.44 MIL/uL (ref 4.22–5.81)
RBC: 4.98 MIL/uL (ref 4.22–5.81)
RDW: 13.3 % (ref 11.5–15.5)
RDW: 13 % (ref 11.5–15.5)
WBC: 11.4 10*3/uL — ABNORMAL HIGH (ref 4.0–10.5)
WBC: 9.4 10*3/uL (ref 4.0–10.5)

## 2011-03-15 LAB — URINALYSIS, ROUTINE W REFLEX MICROSCOPIC
Glucose, UA: NEGATIVE mg/dL
Ketones, ur: NEGATIVE mg/dL
Nitrite: POSITIVE — AB
Protein, ur: 100 mg/dL — AB
Specific Gravity, Urine: >1.03 — ABNORMAL HIGH (ref 1.005–1.030)
Urobilinogen, UA: 0.2 mg/dL (ref 0.0–1.0)
pH: 6.5 (ref 5.0–8.0)

## 2011-03-15 LAB — BASIC METABOLIC PANEL
BUN: 13 mg/dL (ref 6–23)
CO2: 28 mEq/L (ref 19–32)
Calcium: 9.7 mg/dL (ref 8.4–10.5)
Chloride: 98 mEq/L (ref 96–112)
Creatinine, Ser: 1.12 mg/dL (ref 0.4–1.5)
GFR calc Af Amer: >60 mL/min (ref >60)
GFR calc non Af Amer: >60 mL/min (ref >60)
Glucose, Bld: 129 mg/dL — ABNORMAL HIGH (ref 70–99)
Potassium: 4 mEq/L (ref 3.5–5.1)
Sodium: 137 mEq/L (ref 135–145)

## 2011-03-15 LAB — URINE MICROSCOPIC-ADD ON

## 2011-03-15 LAB — APTT: aPTT: 82 seconds — ABNORMAL HIGH (ref 24–37)

## 2011-03-15 LAB — PROTIME-INR
INR: 9.53 (ref 0.00–1.49)
Prothrombin Time: 75.9 seconds — ABNORMAL HIGH (ref 11.6–15.2)

## 2011-03-15 MED ORDER — IOHEXOL 300 MG/ML  SOLN
100.0000 mL | Freq: Once | INTRAMUSCULAR | Status: AC | PRN
  Administered 2011-03-15: 100 mL via INTRAVENOUS

## 2011-03-16 LAB — DIFFERENTIAL
Basophils Absolute: 0.1 10*3/uL (ref 0.0–0.1)
Basophils Absolute: 0 10*3/uL (ref 0.0–0.1)
Basophils Absolute: 0 10*3/uL (ref 0.0–0.1)
Basophils Relative: 1 % (ref 0–1)
Basophils Relative: 0 % (ref 0–1)
Basophils Relative: 0 % (ref 0–1)
Eosinophils Absolute: 0.2 10*3/uL (ref 0.0–0.7)
Eosinophils Absolute: 0.2 10*3/uL (ref 0.0–0.7)
Eosinophils Absolute: 0.4 10*3/uL (ref 0.0–0.7)
Eosinophils Relative: 2 % (ref 0–5)
Eosinophils Relative: 2 % (ref 0–5)
Eosinophils Relative: 4 % (ref 0–5)
Lymphocytes Relative: 21 % (ref 12–46)
Lymphocytes Relative: 18 % (ref 12–46)
Lymphocytes Relative: 23 % (ref 12–46)
Lymphs Abs: 2.2 10*3/uL (ref 0.7–4.0)
Lymphs Abs: 1.8 10*3/uL (ref 0.7–4.0)
Lymphs Abs: 2.2 10*3/uL (ref 0.7–4.0)
Monocytes Absolute: 1.3 10*3/uL — ABNORMAL HIGH (ref 0.1–1.0)
Monocytes Absolute: 1 10*3/uL (ref 0.1–1.0)
Monocytes Absolute: 1 10*3/uL (ref 0.1–1.0)
Monocytes Relative: 13 % — ABNORMAL HIGH (ref 3–12)
Monocytes Relative: 11 % (ref 3–12)
Monocytes Relative: 11 % (ref 3–12)
Neutro Abs: 6.5 10*3/uL (ref 1.7–7.7)
Neutro Abs: 6.7 10*3/uL (ref 1.7–7.7)
Neutro Abs: 6 10*3/uL (ref 1.7–7.7)
Neutrophils Relative %: 63 % (ref 43–77)
Neutrophils Relative %: 69 % (ref 43–77)
Neutrophils Relative %: 63 % (ref 43–77)

## 2011-03-16 LAB — BASIC METABOLIC PANEL
BUN: 13 mg/dL (ref 6–23)
CO2: 29 mEq/L (ref 19–32)
Calcium: 9 mg/dL (ref 8.4–10.5)
Chloride: 101 mEq/L (ref 96–112)
Creatinine, Ser: 0.96 mg/dL (ref 0.4–1.5)
GFR calc Af Amer: >60 mL/min (ref >60)
GFR calc non Af Amer: >60 mL/min (ref >60)
Glucose, Bld: 120 mg/dL — ABNORMAL HIGH (ref 70–99)
Potassium: 3.6 mEq/L (ref 3.5–5.1)
Sodium: 137 mEq/L (ref 135–145)

## 2011-03-16 LAB — CBC
HCT: 42.9 % (ref 39.0–52.0)
HCT: 43.4 % (ref 39.0–52.0)
HCT: 42.1 % (ref 39.0–52.0)
Hemoglobin: 14.3 g/dL (ref 13.0–17.0)
Hemoglobin: 14.7 g/dL (ref 13.0–17.0)
Hemoglobin: 14.5 g/dL (ref 13.0–17.0)
MCH: 30.2 pg (ref 26.0–34.0)
MCH: 30.8 pg (ref 26.0–34.0)
MCH: 30.9 pg (ref 26.0–34.0)
MCHC: 33.3 g/dL (ref 30.0–36.0)
MCHC: 33.9 g/dL (ref 30.0–36.0)
MCHC: 34.4 g/dL (ref 30.0–36.0)
MCV: 90.7 fL (ref 78.0–100.0)
MCV: 91 fL (ref 78.0–100.0)
MCV: 89.8 fL (ref 78.0–100.0)
Platelets: 207 10*3/uL (ref 150–400)
Platelets: 207 10*3/uL (ref 150–400)
Platelets: 198 10*3/uL (ref 150–400)
RBC: 4.73 MIL/uL (ref 4.22–5.81)
RBC: 4.77 MIL/uL (ref 4.22–5.81)
RBC: 4.69 MIL/uL (ref 4.22–5.81)
RDW: 13 % (ref 11.5–15.5)
RDW: 12.9 % (ref 11.5–15.5)
RDW: 12.8 % (ref 11.5–15.5)
WBC: 10.4 10*3/uL (ref 4.0–10.5)
WBC: 9.7 10*3/uL (ref 4.0–10.5)
WBC: 9.7 10*3/uL (ref 4.0–10.5)

## 2011-03-16 LAB — PROTIME-INR
INR: 1.49 (ref 0.00–1.49)
Prothrombin Time: 18.2 seconds — ABNORMAL HIGH (ref 11.6–15.2)

## 2011-03-16 LAB — APTT: aPTT: 34 seconds (ref 24–37)

## 2011-03-17 LAB — CBC
HCT: 40.7 % (ref 39.0–52.0)
HCT: 50.3 % (ref 39.0–52.0)
Hemoglobin: 13.5 g/dL (ref 13.0–17.0)
Hemoglobin: 16.9 g/dL (ref 13.0–17.0)
MCH: 30.1 pg (ref 26.0–34.0)
MCH: 31.2 pg (ref 26.0–34.0)
MCHC: 33.2 g/dL (ref 30.0–36.0)
MCHC: 33.6 g/dL (ref 30.0–36.0)
MCV: 90.8 fL (ref 78.0–100.0)
MCV: 92.7 fL (ref 78.0–100.0)
Platelets: 188 10*3/uL (ref 150–400)
Platelets: 225 10*3/uL (ref 150–400)
RBC: 4.48 MIL/uL (ref 4.22–5.81)
RBC: 5.44 MIL/uL (ref 4.22–5.81)
RDW: 12.9 % (ref 11.5–15.5)
RDW: 14.1 % (ref 11.5–15.5)
WBC: 7.6 10*3/uL (ref 4.0–10.5)
WBC: 10.8 10*3/uL — ABNORMAL HIGH (ref 4.0–10.5)

## 2011-03-17 LAB — DIFFERENTIAL
Basophils Absolute: 0.1 10*3/uL (ref 0.0–0.1)
Basophils Absolute: 0 10*3/uL (ref 0.0–0.1)
Basophils Relative: 1 % (ref 0–1)
Basophils Relative: 0 % (ref 0–1)
Eosinophils Absolute: 0.4 10*3/uL (ref 0.0–0.7)
Eosinophils Absolute: 0.3 10*3/uL (ref 0.0–0.7)
Eosinophils Relative: 5 % (ref 0–5)
Eosinophils Relative: 3 % (ref 0–5)
Lymphocytes Relative: 23 % (ref 12–46)
Lymphocytes Relative: 13 % (ref 12–46)
Lymphs Abs: 1.7 10*3/uL (ref 0.7–4.0)
Lymphs Abs: 1.4 10*3/uL (ref 0.7–4.0)
Monocytes Absolute: 1.1 10*3/uL — ABNORMAL HIGH (ref 0.1–1.0)
Monocytes Absolute: 1 10*3/uL (ref 0.1–1.0)
Monocytes Relative: 14 % — ABNORMAL HIGH (ref 3–12)
Monocytes Relative: 9 % (ref 3–12)
Neutro Abs: 4.4 10*3/uL (ref 1.7–7.7)
Neutro Abs: 8.1 10*3/uL — ABNORMAL HIGH (ref 1.7–7.7)
Neutrophils Relative %: 58 % (ref 43–77)
Neutrophils Relative %: 75 % (ref 43–77)

## 2011-03-17 LAB — POCT I-STAT, CHEM 8
BUN: 10 mg/dL (ref 6–23)
Calcium, Ion: 1.1 mmol/L — ABNORMAL LOW (ref 1.12–1.32)
Chloride: 107 mEq/L (ref 96–112)
Creatinine, Ser: 1.2 mg/dL (ref 0.4–1.5)
Glucose, Bld: 119 mg/dL — ABNORMAL HIGH (ref 70–99)
HCT: 53 % — ABNORMAL HIGH (ref 39.0–52.0)
Hemoglobin: 18 g/dL — ABNORMAL HIGH (ref 13.0–17.0)
Potassium: 4.3 mEq/L (ref 3.5–5.1)
Sodium: 139 mEq/L (ref 135–145)
TCO2: 27 mmol/L (ref 0–100)

## 2011-03-17 LAB — URINALYSIS, ROUTINE W REFLEX MICROSCOPIC
Glucose, UA: NEGATIVE mg/dL
Hgb urine dipstick: NEGATIVE
Ketones, ur: NEGATIVE mg/dL
Nitrite: NEGATIVE
Protein, ur: NEGATIVE mg/dL
Specific Gravity, Urine: 1.024 (ref 1.005–1.030)
Urobilinogen, UA: 0.2 mg/dL (ref 0.0–1.0)
pH: 5.5 (ref 5.0–8.0)

## 2011-03-17 LAB — URINE CULTURE
Colony Count: NO GROWTH
Culture  Setup Time: 201203170247
Culture: NO GROWTH

## 2011-03-17 LAB — BASIC METABOLIC PANEL
BUN: 7 mg/dL (ref 6–23)
CO2: 29 mEq/L (ref 19–32)
Calcium: 8.4 mg/dL (ref 8.4–10.5)
Chloride: 102 mEq/L (ref 96–112)
Creatinine, Ser: 0.95 mg/dL (ref 0.4–1.5)
GFR calc Af Amer: >60 mL/min (ref >60)
GFR calc non Af Amer: >60 mL/min (ref >60)
Glucose, Bld: 143 mg/dL — ABNORMAL HIGH (ref 70–99)
Potassium: 4 mEq/L (ref 3.5–5.1)
Sodium: 137 mEq/L (ref 135–145)

## 2011-03-17 LAB — POCT CARDIAC MARKERS
CKMB, poc: <1 ng/mL — ABNORMAL LOW (ref 1.0–8.0)
Myoglobin, poc: 61.6 ng/mL (ref 12–200)
Troponin i, poc: <0.05 ng/mL (ref 0.00–0.09)

## 2011-03-17 LAB — MAGNESIUM: Magnesium: 1.9 mg/dL (ref 1.5–2.5)

## 2011-03-17 LAB — PROTIME-INR
INR: 1.27 (ref 0.00–1.49)
Prothrombin Time: 16.1 seconds — ABNORMAL HIGH (ref 11.6–15.2)

## 2011-03-18 LAB — DIFFERENTIAL
Basophils Absolute: 0.1 10*3/uL (ref 0.0–0.1)
Basophils Relative: 1 % (ref 0–1)
Eosinophils Absolute: 0.5 10*3/uL (ref 0.0–0.7)
Eosinophils Relative: 6 % — ABNORMAL HIGH (ref 0–5)
Lymphocytes Relative: 23 % (ref 12–46)
Lymphs Abs: 1.8 10*3/uL (ref 0.7–4.0)
Monocytes Absolute: 1 10*3/uL (ref 0.1–1.0)
Monocytes Relative: 13 % — ABNORMAL HIGH (ref 3–12)
Neutro Abs: 4.6 10*3/uL (ref 1.7–7.7)
Neutrophils Relative %: 58 % (ref 43–77)

## 2011-03-18 LAB — PROTIME-INR
INR: 1.61 — ABNORMAL HIGH (ref 0.00–1.49)
Prothrombin Time: 19.3 seconds — ABNORMAL HIGH (ref 11.6–15.2)

## 2011-03-18 LAB — CBC
HCT: 42.1 % (ref 39.0–52.0)
Hemoglobin: 14 g/dL (ref 13.0–17.0)
MCH: 30.2 pg (ref 26.0–34.0)
MCHC: 33.3 g/dL (ref 30.0–36.0)
MCV: 90.7 fL (ref 78.0–100.0)
Platelets: 203 10*3/uL (ref 150–400)
RBC: 4.64 MIL/uL (ref 4.22–5.81)
RDW: 12.8 % (ref 11.5–15.5)
WBC: 7.9 10*3/uL (ref 4.0–10.5)

## 2011-03-19 LAB — DIFFERENTIAL
Basophils Absolute: 0.1 10*3/uL (ref 0.0–0.1)
Basophils Relative: 1 % (ref 0–1)
Eosinophils Absolute: 0.1 10*3/uL (ref 0.0–0.7)
Eosinophils Relative: 2 % (ref 0–5)
Lymphocytes Relative: 19 % (ref 12–46)
Lymphs Abs: 1.8 10*3/uL (ref 0.7–4.0)
Monocytes Absolute: 0.7 10*3/uL (ref 0.1–1.0)
Monocytes Relative: 7 % (ref 3–12)
Neutro Abs: 7 10*3/uL (ref 1.7–7.7)
Neutrophils Relative %: 72 % (ref 43–77)

## 2011-03-19 LAB — COMPREHENSIVE METABOLIC PANEL
ALT: 29 U/L (ref 0–53)
AST: 31 U/L (ref 0–37)
Albumin: 3.7 g/dL (ref 3.5–5.2)
Alkaline Phosphatase: 87 U/L (ref 39–117)
BUN: 9 mg/dL (ref 6–23)
CO2: 27 mEq/L (ref 19–32)
Calcium: 9.1 mg/dL (ref 8.4–10.5)
Chloride: 106 mEq/L (ref 96–112)
Creatinine, Ser: 0.98 mg/dL (ref 0.4–1.5)
GFR calc Af Amer: >60 mL/min (ref >60)
GFR calc non Af Amer: >60 mL/min (ref >60)
Glucose, Bld: 115 mg/dL — ABNORMAL HIGH (ref 70–99)
Potassium: 3.8 mEq/L (ref 3.5–5.1)
Sodium: 139 mEq/L (ref 135–145)
Total Bilirubin: 0.6 mg/dL (ref 0.3–1.2)
Total Protein: 6.9 g/dL (ref 6.0–8.3)

## 2011-03-19 LAB — CBC
HCT: 41.5 % (ref 39.0–52.0)
Hemoglobin: 14.5 g/dL (ref 13.0–17.0)
MCHC: 34.9 g/dL (ref 30.0–36.0)
MCV: 88.6 fL (ref 78.0–100.0)
Platelets: 272 10*3/uL (ref 150–400)
RBC: 4.68 MIL/uL (ref 4.22–5.81)
RDW: 13.2 % (ref 11.5–15.5)
WBC: 9.7 10*3/uL (ref 4.0–10.5)

## 2011-03-19 LAB — POCT CARDIAC MARKERS
CKMB, poc: 6.7 ng/mL (ref 1.0–8.0)
Myoglobin, poc: 192 ng/mL (ref 12–200)
Troponin i, poc: <0.05 ng/mL (ref 0.00–0.09)

## 2011-03-19 LAB — URINALYSIS, ROUTINE W REFLEX MICROSCOPIC
Bilirubin Urine: NEGATIVE
Glucose, UA: NEGATIVE mg/dL
Hgb urine dipstick: NEGATIVE
Ketones, ur: NEGATIVE mg/dL
Nitrite: NEGATIVE
Protein, ur: NEGATIVE mg/dL
Specific Gravity, Urine: 1.025 (ref 1.005–1.030)
Urobilinogen, UA: 0.2 mg/dL (ref 0.0–1.0)
pH: 6 (ref 5.0–8.0)

## 2011-03-19 LAB — LIPASE, BLOOD: Lipase: 33 U/L (ref 11–59)

## 2011-04-11 NOTE — H&P (Signed)
NAMELUISMARIO, [name]              ACCOUNT NO.:  [id number]  MEDICAL RECORD NO.:  [id number]           PATIENT TYPE:  I  LOCATION:  A339                          FACILITY:  [facility]  PHYSICIAN:  Osvaldo Shipper, MD     DATE OF BIRTH:  January 17, 1951  DATE OF ADMISSION:  03/15/2011 DATE OF DISCHARGE:  LH                             HISTORY & PHYSICAL   PRIMARY CARE PHYSICIAN:  Corrie Mckusick, MD  The patient was recently admitted back in February for DVT and was discharged on Coumadin.  ADMISSION DIAGNOSES: 1. Hematuria. 2. Supratherapeutic INR. 3. History of rheumatoid arthritis. 4. History of acute deep venous thrombosis recently. 5. History of seizure disorder. 6. History of irritable bowel syndrome.  CHIEF COMPLAINT:  Blood in the urine.  HISTORY OF PRESENT ILLNESS:  The patient is a 60 year old Caucasian male who was diagnosed with an acute DVT in the right lower extremity in February and was discharged on Lovenox and warfarin.  The patient tells me that he was in his usual state of health until this past Sunday when he was handling some heavy weight and then he felt like it may have twisted his back.  He started experiencing lower back pain, which seemed to radiate anteriorly to his abdomen.  He was having difficulty with movement especially with turning over in the bed, etc..  Then, this morning he started noticing some blood in the urine and this progressively got worse, so he went to see Dr. Phillips Odor, who is his primary care physician.  Dr. Phillips Odor did an UA and found that he had significant amount of blood in the urine.  This prompted Dr. Phillips Odor to send the patient to the emergency department for further workup and evaluation.  The patient mentioned some lower abdominal discomfort.  He denies any history of kidney stones.  Denies any history of hematuria in the past. Denies nausea, vomiting or diarrhea.  No fever, chills.  No sick contacts.  No weight loss.  He says he  has been taking the dose of Coumadin as recommended by the discharging physician.  He tells me interestingly that when he went to see Dr. Phillips Odor today and had his INR checked it was 3.3.  Denies any syncopal episodes.  MEDICATIONS AT HOME:  He is on 1. Warfarin 5 mg daily. 2. Dicyclomine 10 mg 2 tablets b.i.d. 3. Dilantin 100 mg in the morning, 200 mg in the evening. 4. Enbrel 50 mg subcutaneously every week. 5. Nexium 40 mg daily.  ALLERGIES:  Include TEGRETOL, CLINDAMYCIN, SULFA, CELEBREX and the reported allergy to [facility].  PAST MEDICAL HISTORY: 1. Positive for DVT recently and then DVT about 5 years ago. 2. History of seizure disorder, which is stable. 3. Irritable bowel syndrome. 4. Rheumatoid arthritis.  SOCIAL HISTORY:  Lives in [place] with his family.  He denies smoking, alcohol, or illicit drug use.  Independent with his daily activities. He is a Curator.  FAMILY HISTORY:  Positive for stomach cancer and skin cancer in his father.  REVIEW OF SYSTEMS:  GENERAL:  Positive for weakness, malaise.  HEENT: Unremarkable.  CARDIOVASCULAR:  Unremarkable.  RESPIRATORY: Unremarkable.  GI:  Unremarkable.  GU:  As in HPI.  NEUROLOGICAL: Unremarkable.  PSYCHIATRIC:  Unremarkable.  Other systems reviewed and found to be negative.  PHYSICAL EXAMINATION:  VITAL SIGNS:  Temperature 98.9, blood pressure 142/95, heart rate 85, respiratory rate 18, saturation 96% on room air. GENERAL:  This is a thin white male in no distress. HEENT:  Head is normocephalic, atraumatic.  Pupils are equal reacting. No pallor, no icterus.  Oral, mucous membranes moist.  No oral lesions are noted. NECK:  Soft and supple.  No thyromegaly is appreciated.  No cervical, supraclavicular, or inguinal lymphadenopathy is present. LUNGS:  Clear to auscultation bilaterally with no wheezing, rales or rhonchi. CARDIOVASCULAR SYSTEM:  S1, S2 is normal, regular.  No S3-S4.  No rubs, murmurs or bruits. ABDOMEN:  Soft,  vague tenderness in the lower abdomen without any rebound, rigidity or guarding.  Bowel sounds are present.  No masses or organomegaly is appreciated. GU:  No obvious deformity externally. BACK:  Examination the back revealed some minimal vague tenderness in the lower back. EXTREMITIES:  Straight leg raising test was negative. NEUROLOGIC:  Otherwise, he is alert, oriented x3.  No focal neurological deficits are present.  LABORATORY DATA:  His white cell count is 11.4, hemoglobin is 16.3, platelet count is 242,000.  His INR is 9.53.  Sodium is 137, potassium 4.0.  Renal function is normal.  Glucose 129.  UA was brown, hazy colored, greater than 1.030 specific gravity, small bilirubin, large blood, some protein, positive nitrite, trace leukocytes, few squamous epithelial cells, 11-20 wbc's, numerous rbc's, many bacteria.  Urine culture is pending.  IMAGING STUDIES:  He had a CT of the abdomen and pelvis, which did not show any acute abnormality.  Mild lumbar and lower thoracic spine degenerative changes were noted.  ASSESSMENT:  This is a 60 year old Caucasian male with recent history of deep venous thrombosis, who presents to the hospital with hematuria.  He is found to have supratherapeutic INR as well.  PLAN: 1. Hematuria is probably from supratherapeutic INR.  He has been given     vitamin K.  We will monitor his urine output closely.  If his     hematuria does not improve he may need an urological workup as an     inpatient, otherwise, this can be done as an outpatient, so we will     hold off on urological evaluation for now. 2. Supratherapeutic INR.  He has been given vitamin K.  We will check     his INR tomorrow morning and we will ask Pharmacy to doses     warfarin. 3. History of rheumatoid arthritis.  He is on Enbrel.  I checked     interaction between Enbrel and Coumadin and actually the     combination can decrease the effect of the warfarin. 4. History of seizure  disorder.  Continue with Dilantin. 5. We will check CBCs q.6 h x4.  We will put him on ceftriaxone for     his abnormal UA.  Even though, he has an reported allergy to     Keflex, he did tolerate ceftriaxone in the ED without any     difficulties.  Urine cultures will be followed up on.  DVT prophylaxis, he is already on warfarin.  Further management decisions will depend on results of further testing and patient's response to treatment.  Osvaldo Shipper, MD     GK/MEDQ  D:  03/15/2011  T:  03/15/2011  Job:  [id number]  cc:   Corrie Mckusick, M.D. Fax: [phone]  Electronically Signed by Osvaldo Shipper MD on 04/11/2011 11:00:08 PM

## 2011-04-17 NOTE — Discharge Summary (Signed)
[name], [name]              ACCOUNT NO.:  [id number]  MEDICAL RECORD NO.:  [id number]           PATIENT TYPE:  I  LOCATION:  A339                          FACILITY:  [facility]  PHYSICIAN:  [name] L. Lendell Caprice, MDDATE OF BIRTH:  July 31, 1951  DATE OF ADMISSION:  03/15/2011 DATE OF DISCHARGE:  03/19/2012LH                              DISCHARGE SUMMARY   DISCHARGE DIAGNOSES: 1. Hematuria secondary to supratherapeutic Coumadin and urinary tract     infection, resolved, needs outpatient followup after resolution of     his infection. 2. Recent right lower extremity deep vein thrombosis. 3. Irritable bowel syndrome. 4. History of seizures. 5. Rheumatoid arthritis.  DISCHARGE MEDICATIONS: 1. Alternate warfarin 5 mg with 2-1/2 mg a day or per Dr. Phillips Odor to     keep INR between 2 and 3. 2. Nitrofurantoin 1 p.o. b.i.d. to complete a total of 5 days of     antibiotic. 3. Dilantin b.i.d. as previous. 4. Nexium 40 mg a day. 5. Enbrel 50 mg subcutaneously weekly. 6. Dicyclomine 10 mg 2 p.o. b.i.d. 7. Tylenol 500 mg 1 p.o. q.4 hours p.r.n. pain.  CONDITION:  Stable.  ACTIVITY:  Ad lib. but when in bed or in a chair, keep his right leg elevated and wear his compression stocking on the right.  CONSULTATIONS:  None.  PROCEDURES:  None.  FOLLOWUP:  He will have an INR check later this week at Dr. Lamar Blinks office.  Please adjust Coumadin to keep INR between 2 and 3.  Also, I have asked the secretary to schedule a followup with Dr. Phillips Odor in 2-4 weeks.  Please check a repeat urinalysis.  If hematuria remains despite resolution of infection, consider referral to Urology.  DIET:  Should be heart-healthy, Coumadin friendly.  LABS ON ADMISSION:  White blood cell count was 11,400.  At discharge, his white count is normal.  CBC is otherwise unremarkable.  INR on admission was 9.5, PTT 82.  At discharge, his INR is 1.61 and he has received 1.5 mg/kg of Lovenox today in addition to 5 mg  of Coumadin. Basic metabolic panel unremarkable.  Urinalysis was brown and hazy, specific gravity greater than 1.030, small bilirubin, negative ketones, large blood, 100 protein, positive nitrite, trace leukocyte esterase, few squamous epithelial cells, 11-20 white cells, too numerous to count red cells, many bacteria.  Urine culture was no growth.  CT of the abdomen and pelvis with contrast showed linear atelectasis or scarring again demonstrated at the right lung base, descending and sigmoid colon diverticula, mild lumbar and lower thoracic spine degenerative changes, nothing acute.  HISTORY AND HOSPITAL COURSE:  Please see H and P for complete admission details.  [name] is a pleasant 60 year old white male who recently was hospitalized and treated for right leg DVT.  He presented to his primary care physician with hematuria and back pain and abdominal pain. His INR rose reportedly elevated in the office and in the emergency room was 9.  He did have nitrites positive in his urine and was treated with IV antibiotics initially, subsequently changed to Macrobid.  I avoided fluoroquinolone to reduce interaction with Coumadin.  He has had no fevers.  He has had no dysuria but his symptoms of pain may have been related to urine infection.  CT scan was negative for anything acute. His gross hematuria cleared and may be simply a result of his coagulopathy and infection but if he still continues to have hematuria, either gross or microscopic, consider referral to Urology especially in light of his recent DVT.  He was given vitamin K in the emergency room which brought his INR down very quickly.  He was started back on Coumadin and bridged with Lovenox.  He still has some mild swelling of the right leg which would not be uncommon with a recent DVT.  He had no sign of infection in that leg.  His other medical problems remained stable during the hospitalization.  Total time on the day of  discharge is greater than 30 minutes.     Ronny Ruddell L. Lendell Caprice, MD     CLS/MEDQ  D:  03/18/2011  T:  03/18/2011  Job:  [id number]  cc:   Dr. Phillips Odor  Electronically Signed by Crista Curb MD on 04/17/2011 09:27:15 PM

## 2011-05-14 NOTE — Assessment & Plan Note (Signed)
[facility]                         GASTROENTEROLOGY OFFICE NOTE   TEAL, BONTRAGER                     MRN:          [id number]  DATE:05/03/2008                            DOB:          April 07, 1951    REFERRING PHYSICIAN:  Patrica Duel, M.D.   REASON FOR CONSULTATION:  Lower abdominal pain and hematochezia.   HISTORY OF PRESENT ILLNESS:  [name] is a 60 year old white male who  I previously evaluated for diffuse abdominal pain, reflux symptoms and  diarrhea.  Colonoscopy in March of 2001 showed sigmoid colon  diverticulosis, internal hemorrhoids and a polypoid area of colonic  mucosa that was not adenomatous.  Upper endoscopy in November of 2003  was normal.  He was presumed to have GERD and irritable bowel syndrome.  He has been taking dicyclomine twice daily with improvement in symptoms.  He has stable chronic constipation with a bowel movement about every  three days.  He has noted rare episodes of small volume hematochezia.  He notes lower abdominal pain, left lower quadrant greater than right  lower quadrant, that has worsened recently.  He was evaluated by Dr.  Nobie Putnam.  A CBC and CMET were unremarkable except for a minimally  elevated alkaline phosphatase at 118 and a minimally elevated glucose at  101.  A CT scan of the abdomen and pelvis dated March 24, 2008 showed  minimal subsegmental atelectasis in the left lower lobe and sigmoid  colon diverticulosis, and no other abnormalities.  His appetite is good  and his weight is stable.  He has intermittent acid reflux symptoms, but  the symptoms are generally well controlled on daily Nexium.  He notes  frequent bloating.  He notes no change in stool caliber.   FAMILY HISTORY:  Negative for colon cancer, colon polyps and  inflammatory bowel disease.   PAST MEDICAL HISTORY:  1. GERD.  2. Seizures.  3. Sleep apnea.  4. Diverticulosis.  5. Irritable bowel syndrome.  6. Left lower  extremity deep venous thrombosis in 2004.   PAST SURGICAL HISTORY:  Status post cholecystectomy 1998.   CURRENT MEDICATIONS:  Listed on the chart, updated and reviewed.   MEDICATION ALLERGIES:  KEFLEX AND TEGRETOL.   SOCIAL HISTORY/REVIEW OF SYSTEMS:  Per the handwritten form.   PHYSICAL EXAMINATION:  Well developed, overweight white male in no acute  distress.  Height 5 feet 9 inches.  Weight 203.6 pounds.  Blood pressure  is 120/80, pulse 72 and regular.  HEENT:  Anicteric sclerae.  Oral pharynx clear.  CHEST:  Clear to auscultation bilaterally.  CARDIAC:  Regular rate and rhythm without murmurs.  ABDOMEN:  Soft, nontender, nondistended.  Normoactive bowel sounds.  No  palpable organomegaly, masses or hernias.  RECTAL:  Exam deferred to colonoscopy.  EXTREMITIES:  2+ ankle and pretibial edema bilaterally, slightly worse  on the he left.  NEUROLOGIC:  Alert and oriented x3.  Grossly nonfocal.   ASSESSMENT/PLAN:  Lower abdominal pain, left lower quadrant greater than  right lower quadrant, associated with bloating, chronic constipation and  a history of irritable bowel syndrome.  I suspect the majority of  symptoms are related to irritable bowel syndrome and consiptation.  He  is to be on a high fiber diet with increased water intake.  Begin a low  gas diet.  Gas-Ex q.i.d. p.r.n.  Increase dicyclomine to q.i.d.  Given  his worsening pain and small volume hematochezia, we proceed with  colonoscopy to complete the evaluation.  The risks, benefits and  alternatives to colonoscopy of possible biopsy and possible polypectomy  were discussed with the patient and he consents to proceed and this will  be scheduled electively.     Venita Lick. Russella Dar, MD, [facility]  Electronically Signed    MTS/MedQ  DD: 05/03/2008  DT: 05/03/2008  Job #: [id number]   cc:   Patrica Duel, M.D.

## 2011-05-17 NOTE — H&P (Signed)
NAMEZACHERIAH, STUMPE NO.:  [id number]   MEDICAL RECORD NO.:  [id number]          PATIENT TYPE:  INP   LOCATION:  A305                          FACILITY:  [facility]   PHYSICIAN:  Patrica Duel, M.D.    DATE OF BIRTH:  [birth year]   DATE OF ADMISSION:  04/29/2005  DATE OF DISCHARGE:  LH                                HISTORY & PHYSICAL   CHIEF COMPLAINT:  Pain and swelling in leg.   HISTORY OF PRESENT ILLNESS:  This is a 60 year old male with a history of a  seizure disorder, irritable bowel syndrome, gastroesophageal reflux disease.  He is status post cholecystectomy in 2000 and vasectomy in 1980.  He is  allergic to TEGRETOL and KEFLEX.  He also has occasional episodes of  asthmatic bronchitis and is a former smoker but has quit some years ago.   The patient presented to the office approximately a week ago with apparent  folliculitis of the left lower extremity with mild edema.  He was treated  empirically with Cleocin and betamethasone.  Apparently the patient has had  an allergic reaction with mild urticaria and presented back to the office  today with diffuse pruritus as well as continued edema of the left lower  extremity.  He had no other systemic signs of allergic reaction or other  complaints.   He was found to have 1+ edema with a negative Homans sign.  He did have mild  erythema and a 1 sq. cm area of induration of the pretibial region.  He also  had mild urticaria.   Given the fact that the patient had persistent unilateral edema, a stat  venous Doppler of the left lower extremity was obtained, which revealed deep  venous thrombosis to be present (somewhat surprisingly).   There has been no history of fever, chills, nausea or vomiting, hemoptysis,  cough, shortness of breath, syncope, palpitation, melena, hematochezia,  genitourinary symptoms.   The patient is admitted with acute deep venous thrombosis of the left lower  extremity without evidence of  pulmonary embolism.  He also has an active  allergic reaction, which is somewhat mild.   CURRENT MEDICATIONS:  1.  Dilantin 100 mg two q.h.s., one q.a.m.  2.  Bentyl 20 mg t.i.d. p.r.n.  3.  Nexium 40 mg daily.   ALLERGIES:  As noted, also add CLINDAMYCIN.   PAST HISTORY:  As noted.   REVIEW OF SYSTEMS:  Negative except as mentioned.   FAMILY HISTORY:  Significant for father who is 41 years old and has stomach  and skin cancer.  Mother died of Alzheimer's disease.  Eleven siblings,  unknown health.   PHYSICAL EXAMINATION:  GENERAL:  A very pleasant male who is alert and  oriented, in no acute distress.  VITAL SIGNS:  Temperature 97.8, heart rate is 78 and regular, blood pressure  120/72, respirations approximately 16-18, unlabored.  HEENT:  Normocephalic, atraumatic.  Pupils equal.  Ears, nose and throat are  benign.  NECK:  Supple without bruits, thyromegaly or lymphadenopathy or masses.  CHEST:  Lungs are clear.  CARDIAC:  Heart sounds  are normal.  ABDOMEN:  Nontender, nondistended.  There are no masses or organomegaly.  EXTREMITIES:  No clubbing or cyanosis.  1+ edema is present with very mild  erythema as well as mild pretibial tenderness.  Denna Haggard is negative.  There  is no inguinal adenopathy noted.  NEUROLOGIC:  Nonfocal.  SKIN:  Mild urticarial rash, particularly of the lower back.   ASSESSMENT:  Deep venous thrombosis, probably subacute, possibly related to  cellulitis.  Unassociated hypersensitivity reaction to clindamycin likely.   PLAN:  Admit for Lovenox and Coumadin and further evaluation and therapy as  indicated.  He has already received an injection of Depo-Medrol and will be  treated with antihistamines for his allergic reaction.  Levaquin 500 mg for  ongoing cellulitis.  Will follow and treat expectantly.      MC/MEDQ  D:  04/29/2005  T:  04/29/2005  Job:  [id number]

## 2011-05-17 NOTE — Op Note (Signed)
[facility]  Patient:    QUANTEZ, SCHNYDER Visit Number: [id number] MRN: [id number]          Service Type: NES Location: NESC Attending Physician:  Katherine Roan Dictated by:   Rozanna Boer., M.D. Proc. Date: 01/13/02 Admit Date:  01/13/2002                             Operative Report  PREOPERATIVE DIAGNOSIS:  Phimosis, hematuria.  POSTOPERATIVE DIAGNOSIS:  Phimosis, hematuria.  OPERATION:  Flexible cystoscopy and circumcision.  ANESTHESIA:  General.  SURGEON:  Rozanna Boer., M.D.  BRIEF HISTORY:  This is a 60 year old smoker admitted with two to three month history of foreskin that is now nonretractable, and causing irrigation and some hematuria.  He had had previous vasectomy and gallbladder.  He is on Dilantin as the only medication.  Enters now for flexible cystoscopy while he is asleep and a circumcision for his phimosis.  DESCRIPTION OF PROCEDURE:  The patient was placed on the operating room in the supine position.  After satisfactory induction of general anesthesia, was prepped and draped with Betadine.  The flexible panendoscope was then used to inspect the anterior urethra.  No anterior strictures seen.  The posterior urethra was nonobstructing.  The prostate was about 2.5 cm in length.  Bilobar in configuration.  The bladder was entered and had 1+ trabeculation, but no bladder mucosal lesions seen.  Very minimal projection of the prostate into the lumen.  The trigonal orifices looked normal with clear efflux bilaterally. The scope was removed and the patient then reprepped and draped.  A circumferential incision was made around the redundant foreskin, and a dorsal slit was then accomplished, and the prep was then completed.  Another circumferential incision was made around the coronal sulcus, and the redundant skin was then removed with the Bovie with the fine-needle electrode.  This effected  good hemostasis.  The edges of the skin were then reapproximated with 4-0 chromic catgut suture after half the sutures were placed, about 8 cc of 0.25% plain Marcaine was injected at the base of the penis for postoperative pain relief, and the rest of the sutures then placed.  A dressing of collodion, Vaseline gauze, sterile dry bandage, and collodion was then applied, and the patient was allowed to react from his anesthetic and returned to the recovery room in good condition, and will later be discharged as an outpatient with detailed written instructions. Dictated by:   Rozanna Boer., M.D. Attending Physician:  Katherine Roan DD:  01/13/02 TD:  01/13/02 Job: [id number]

## 2011-05-17 NOTE — Discharge Summary (Signed)
NAMECYNTHIA, STAINBACK NO.:  [id number]   MEDICAL RECORD NO.:  [id number]          PATIENT TYPE:  INP   LOCATION:  A305                          FACILITY:  [facility]   PHYSICIAN:  Patrica Duel, M.D.    DATE OF BIRTH:  February 19, 1951   DATE OF ADMISSION:  04/29/2005  DATE OF DISCHARGE:  05/05/2006LH                                 DISCHARGE SUMMARY   DISCHARGE DIAGNOSES:  1.  Acute deep venous thrombosis of the left lower extremity.  No      complications.  Adequate Coumadinization after four days Lovenox      therapy.  2.  History of irritable bowel syndrome.  3.  History of gastroesophageal reflux disease, controlled.  4.  Seizure disorder, controlled.   ALLERGIES:  TEGRETOL.  KEFLEX.  CLINDAMYCIN   PAST SURGICAL HISTORY:  Status post cholecystectomy and vasectomy.   HISTORY OF PRESENT ILLNESS:  For details regarding the admission please  refer to the admitting note.  Briefly, this 60 year old male with a history  as noted above presented to the office one week prior to this admission with  apparent folliculitis of the left lower extremity with mild edema.  He was  treated empirically with Cleocin and betamethasone.  He had mild urticaria  probably secondary to the Clindamycin and presented back to the office with  diffuse pruritus as well as  continued edema of the left lower extremities.  He had no other systemic signs of allergic reaction or shortness of breath,  cough, hemoptysis, etc.   There was 1+ edema of the left lower extremity, mild erythema and one square  centimeter region of induration of the pretibial region.  He also had mild  urticaria.  The Homan sign was negative.   The patient underwent STAT venous Doppler evaluation due to unilateral edema  and was found to have deep venous thrombosis involving the left popliteal  and posterior tibial veins.  He was admitted for anticoagulation and further  evaluation and therapy as indicated.   FAMILY  HISTORY:  Negative for DVT.   HOSPITAL COURSE:  The patient did very well in the hospital.  He was treated  empirically with Levaquin for mild cellulitis.  His edema, erythema as well  as allergic symptoms have resolved.  He is currently stable without any  complaints whatsoever.  Due to the possibility of underlying malignancy a CT  of the chest, abdomen and pelvis was obtained as well as a CEA and PSA.  All  were benign except for a 0.7 cm right upper lobe nodule which was  nonspecific.  A three month follow up CT was recommended.  CT pelvis benign  except for atherosclerosis.  Currently INR is 2.1.   DISCHARGE MEDICATIONS:  1.  Coumadin 5 mg daily.  2.  Dilantin two q.h.s. and one q. a.m.  3.  Bentyl 20 t.i.d. p.r.n.  4.  Nexium 40 daily.   DISPOSITION:  He will be followed in three days for repeat pro time and  treated expectantly as an outpatient.      MC/MEDQ  D:  05/03/2005  T:  05/03/2005  Job:  [id number]

## 2011-06-19 ENCOUNTER — Other Ambulatory Visit (HOSPITAL_COMMUNITY): Payer: Self-pay | Admitting: Internal Medicine

## 2011-06-19 ENCOUNTER — Ambulatory Visit (HOSPITAL_COMMUNITY)
Admission: RE | Admit: 2011-06-19 | Discharge: 2011-06-19 | Disposition: A | Discharge status: Home / Self Care | Payer: BC Managed Care – PPO | Source: Ambulatory Visit | Attending: Internal Medicine | Admitting: Internal Medicine

## 2011-06-19 DIAGNOSIS — R079 Chest pain, unspecified: Secondary | ICD-10-CM | POA: Insufficient documentation

## 2011-10-02 LAB — GLUCOSE, CAPILLARY: Glucose-Capillary: 101 — ABNORMAL HIGH

## 2011-12-16 ENCOUNTER — Other Ambulatory Visit (HOSPITAL_COMMUNITY): Payer: Self-pay | Admitting: Family Medicine

## 2011-12-16 ENCOUNTER — Ambulatory Visit (HOSPITAL_COMMUNITY)
Admission: RE | Admit: 2011-12-16 | Discharge: 2011-12-16 | Disposition: A | Discharge status: Home / Self Care | Payer: BC Managed Care – PPO | Source: Ambulatory Visit | Attending: Family Medicine | Admitting: Family Medicine

## 2011-12-16 DIAGNOSIS — R0601 Orthopnea: Secondary | ICD-10-CM

## 2011-12-16 DIAGNOSIS — R0602 Shortness of breath: Secondary | ICD-10-CM | POA: Insufficient documentation

## 2011-12-16 DIAGNOSIS — Z87891 Personal history of nicotine dependence: Secondary | ICD-10-CM | POA: Insufficient documentation

## 2012-01-21 HISTORY — PX: DOPPLER ECHOCARDIOGRAPHY: SHX263

## 2012-01-21 HISTORY — PX: OTHER SURGICAL HISTORY: SHX169

## 2012-02-26 ENCOUNTER — Other Ambulatory Visit: Payer: Self-pay

## 2012-02-26 DIAGNOSIS — R0602 Shortness of breath: Secondary | ICD-10-CM

## 2012-03-04 ENCOUNTER — Encounter (HOSPITAL_COMMUNITY): Payer: BC Managed Care – PPO

## 2012-03-05 ENCOUNTER — Ambulatory Visit (HOSPITAL_COMMUNITY): Payer: BC Managed Care – PPO | Attending: Internal Medicine

## 2012-03-21 ENCOUNTER — Other Ambulatory Visit (HOSPITAL_COMMUNITY): Payer: Self-pay | Admitting: Family Medicine

## 2012-03-21 ENCOUNTER — Ambulatory Visit (HOSPITAL_COMMUNITY)
Admission: RE | Admit: 2012-03-21 | Discharge: 2012-03-21 | Disposition: A | Discharge status: Home / Self Care | Payer: BC Managed Care – PPO | Source: Ambulatory Visit | Attending: Family Medicine | Admitting: Family Medicine

## 2012-03-21 DIAGNOSIS — R059 Cough, unspecified: Secondary | ICD-10-CM | POA: Insufficient documentation

## 2012-03-21 DIAGNOSIS — R05 Cough: Secondary | ICD-10-CM

## 2012-03-21 DIAGNOSIS — J986 Disorders of diaphragm: Secondary | ICD-10-CM | POA: Insufficient documentation

## 2012-03-21 DIAGNOSIS — J9819 Other pulmonary collapse: Secondary | ICD-10-CM | POA: Insufficient documentation

## 2012-03-21 DIAGNOSIS — R0989 Other specified symptoms and signs involving the circulatory and respiratory systems: Secondary | ICD-10-CM | POA: Insufficient documentation

## 2012-09-24 ENCOUNTER — Encounter (HOSPITAL_COMMUNITY): Payer: Self-pay | Admitting: *Deleted

## 2012-09-24 ENCOUNTER — Emergency Department (HOSPITAL_COMMUNITY): Payer: BC Managed Care – PPO

## 2012-09-24 ENCOUNTER — Emergency Department (HOSPITAL_COMMUNITY)
Admission: EM | Admit: 2012-09-24 | Discharge: 2012-09-24 | Disposition: A | Discharge status: Home / Self Care | Payer: BC Managed Care – PPO | Attending: Emergency Medicine | Admitting: Emergency Medicine

## 2012-09-24 DIAGNOSIS — R51 Headache: Secondary | ICD-10-CM | POA: Insufficient documentation

## 2012-09-24 DIAGNOSIS — J40 Bronchitis, not specified as acute or chronic: Secondary | ICD-10-CM | POA: Insufficient documentation

## 2012-09-24 DIAGNOSIS — Z86718 Personal history of other venous thrombosis and embolism: Secondary | ICD-10-CM | POA: Insufficient documentation

## 2012-09-24 DIAGNOSIS — R079 Chest pain, unspecified: Secondary | ICD-10-CM | POA: Insufficient documentation

## 2012-09-24 DIAGNOSIS — R0602 Shortness of breath: Secondary | ICD-10-CM | POA: Insufficient documentation

## 2012-09-24 DIAGNOSIS — Z9089 Acquired absence of other organs: Secondary | ICD-10-CM | POA: Insufficient documentation

## 2012-09-24 HISTORY — DX: Unspecified osteoarthritis, unspecified site: M19.90

## 2012-09-24 HISTORY — DX: Unspecified convulsions: R56.9

## 2012-09-24 HISTORY — DX: Localized edema: R60.0

## 2012-09-24 HISTORY — DX: Diverticulosis of intestine, part unspecified, without perforation or abscess without bleeding: K57.90

## 2012-09-24 HISTORY — DX: Gastro-esophageal reflux disease without esophagitis: K21.9

## 2012-09-24 HISTORY — DX: Other cervical disc degeneration, unspecified cervical region: M50.30

## 2012-09-24 HISTORY — DX: Acute embolism and thrombosis of unspecified deep veins of unspecified lower extremity: I82.409

## 2012-09-24 HISTORY — DX: Irritable bowel syndrome, unspecified: K58.9

## 2012-09-24 HISTORY — DX: Edema, unspecified: R60.9

## 2012-09-24 LAB — URINALYSIS, ROUTINE W REFLEX MICROSCOPIC
Bilirubin Urine: NEGATIVE
Glucose, UA: NEGATIVE mg/dL
Hgb urine dipstick: NEGATIVE
Ketones, ur: NEGATIVE mg/dL
Leukocytes, UA: NEGATIVE
Nitrite: NEGATIVE
Protein, ur: NEGATIVE mg/dL
Specific Gravity, Urine: >1.03 — ABNORMAL HIGH (ref 1.005–1.030)
Urobilinogen, UA: 0.2 mg/dL (ref 0.0–1.0)
pH: 5.5 (ref 5.0–8.0)

## 2012-09-24 LAB — PROTIME-INR
INR: 2.51 — ABNORMAL HIGH (ref 0.00–1.49)
Prothrombin Time: 25.9 seconds — ABNORMAL HIGH (ref 11.6–15.2)

## 2012-09-24 LAB — TROPONIN I: Troponin I: <0.3 ng/mL (ref <0.30)

## 2012-09-24 LAB — BASIC METABOLIC PANEL
BUN: 11 mg/dL (ref 6–23)
CO2: 29 mEq/L (ref 19–32)
Calcium: 9.4 mg/dL (ref 8.4–10.5)
Chloride: 98 mEq/L (ref 96–112)
Creatinine, Ser: 1.01 mg/dL (ref 0.50–1.35)
GFR calc Af Amer: >90 mL/min (ref >90)
GFR calc non Af Amer: 78 mL/min — ABNORMAL LOW (ref >90)
Glucose, Bld: 101 mg/dL — ABNORMAL HIGH (ref 70–99)
Potassium: 3.9 mEq/L (ref 3.5–5.1)
Sodium: 136 mEq/L (ref 135–145)

## 2012-09-24 LAB — CBC WITH DIFFERENTIAL/PLATELET
Basophils Absolute: 0 10*3/uL (ref 0.0–0.1)
Basophils Relative: 0 % (ref 0–1)
Eosinophils Absolute: 0.1 10*3/uL (ref 0.0–0.7)
Eosinophils Relative: 1 % (ref 0–5)
HCT: 45.9 % (ref 39.0–52.0)
Hemoglobin: 15.8 g/dL (ref 13.0–17.0)
Lymphocytes Relative: 20 % (ref 12–46)
Lymphs Abs: 1.6 10*3/uL (ref 0.7–4.0)
MCH: 30 pg (ref 26.0–34.0)
MCHC: 34.4 g/dL (ref 30.0–36.0)
MCV: 87.3 fL (ref 78.0–100.0)
Monocytes Absolute: 0.7 10*3/uL (ref 0.1–1.0)
Monocytes Relative: 9 % (ref 3–12)
Neutro Abs: 5.8 10*3/uL (ref 1.7–7.7)
Neutrophils Relative %: 71 % (ref 43–77)
Platelets: 204 10*3/uL (ref 150–400)
RBC: 5.26 MIL/uL (ref 4.22–5.81)
RDW: 13.8 % (ref 11.5–15.5)
WBC: 8.2 10*3/uL (ref 4.0–10.5)

## 2012-09-24 LAB — PRO B NATRIURETIC PEPTIDE: Pro B Natriuretic peptide (BNP): 30.2 pg/mL (ref 0–125)

## 2012-09-24 LAB — PHENYTOIN LEVEL, TOTAL: Phenytoin Lvl: 9.3 ug/mL — ABNORMAL LOW (ref 10.0–20.0)

## 2012-09-24 MED ORDER — PREDNISONE 20 MG PO TABS: 40.0000 mg | ORAL_TABLET | Freq: Every day | ORAL | Status: DC

## 2012-09-24 MED ORDER — IPRATROPIUM BROMIDE 0.02 % IN SOLN
0.5000 mg | Freq: Once | RESPIRATORY_TRACT | Status: AC
Start: 2012-09-24 — End: 2012-09-24
  Administered 2012-09-24: 0.5 mg via RESPIRATORY_TRACT
  Filled 2012-09-24: qty 2.5

## 2012-09-24 MED ORDER — ALBUTEROL SULFATE (5 MG/ML) 0.5% IN NEBU
5.0000 mg | INHALATION_SOLUTION | Freq: Once | RESPIRATORY_TRACT | Status: AC
Start: 2012-09-24 — End: 2012-09-24
  Administered 2012-09-24: 5 mg via RESPIRATORY_TRACT
  Filled 2012-09-24: qty 1

## 2012-09-24 MED ORDER — IOHEXOL 350 MG/ML SOLN
100.0000 mL | Freq: Once | INTRAVENOUS | Status: AC | PRN
  Administered 2012-09-24: 100 mL via INTRAVENOUS

## 2012-09-24 MED ORDER — SODIUM CHLORIDE 0.9 % IV SOLN
INTRAVENOUS | Status: DC
  Administered 2012-09-24: 15:00:00 via INTRAVENOUS

## 2012-09-24 MED ORDER — METHYLPREDNISOLONE SODIUM SUCC 125 MG IJ SOLR
125.0000 mg | Freq: Once | INTRAMUSCULAR | Status: AC
  Administered 2012-09-24: 125 mg via INTRAVENOUS
  Filled 2012-09-24: qty 2

## 2012-09-24 NOTE — ED Notes (Signed)
Discharge instructions reviewed with pt, questions answered. Pt verbalized understanding.  

## 2012-09-24 NOTE — ED Provider Notes (Signed)
History     CSN: [id number]  Arrival date & time 09/24/12  1343   First MD Initiated Contact with Patient 09/24/12 1415      Chief Complaint  Patient presents with  . Chest Pain  . Headache  . Shortness of Breath  . Wheezing     HPI Pt was seen at 1435.  Per pt, c/o gradual onset and persistence of constant chest "pain," SOB and "wheezing" for the past 3 to 4 days.  Pt describes his symptoms as constant and began when he came home from "going to the [facility]" over the weekend (4 hour car ride x2). States the SOB worsens when lays down and walks.  Has been associated with "lightheadedness," and a vague dull "tight" headache that "wraps around my head."  Pt was eval by his PMD yesterday for these symptoms, rx inhaler and antibiotic with partial relief.  Denies palpitations, no cough, no abd pain, no N/V/D, no visual changes, no focal motor weakness, no tingling/numbness in extremities, no injury.  Denies headache was sudden or maximal at onset or at any time.     Cards: [facility] PMD: [facility]bie [facility] Past Medical History  Diagnosis Date  . Seizures   . GERD (gastroesophageal reflux disease)   . IBS (irritable bowel syndrome)   . DVT, lower extremity     right  . Arthritis   . DDD (degenerative disc disease), cervical     with UE's paresthesias  . Diverticulosis   . Peripheral edema     Past Surgical History  Procedure Date  . Cholecystectomy     History  Substance Use Topics  . Smoking status: Former Games developer  . Smokeless tobacco: Not on file  . Alcohol Use: No      Review of Systems ROS: Statement: All systems negative except as marked or noted in the HPI; Constitutional: Negative for fever and chills. ; ; Eyes: Negative for eye pain, redness and discharge. ; ; ENMT: Negative for ear pain, hoarseness, nasal congestion, sinus pressure and sore throat. ; ; Cardiovascular: +SOB, CP, orthopnea, chronic RLE edema. Negative for  palpitations, diaphoresis. ; ; Respiratory: +wheezing.  Negative for cough and stridor. ; ; Gastrointestinal: Negative for nausea, vomiting, diarrhea, abdominal pain, blood in stool, hematemesis, jaundice and rectal bleeding. . ; ; Genitourinary: Negative for dysuria, flank pain and hematuria. ; ; Musculoskeletal: Negative for back pain and neck pain. Negative for swelling and trauma.; ; Skin: Negative for pruritus, rash, abrasions, blisters, bruising and skin lesion.; ; Neuro: +headache, lightheadedness. Negative for neck stiffness. Negative for weakness, altered level of consciousness , altered mental status, extremity weakness, paresthesias, involuntary movement, seizure and syncope.       Allergies  Carbamazepine; Celecoxib; Cephalexin; and Levofloxacin  Home Medications  No current outpatient prescriptions on file.  BP 135/84  Pulse 76  Temp 98.3 F (36.8 C)  Resp 18  Ht 5\' 9"  (1.753 m)  Wt 215 lb (97.523 kg)  BMI 31.75 kg/m2  SpO2 95%  Physical Exam 1440: Physical examination:  Nursing notes reviewed; Vital signs and O2 SAT reviewed;  Constitutional: Well developed, Well nourished, Well hydrated, In no acute distress; Head:  Normocephalic, atraumatic; Eyes: EOMI, PERRL, No scleral icterus; ENMT: Mouth and pharynx normal, Mucous membranes moist; Neck: Supple, Full range of motion, No lymphadenopathy; Cardiovascular: Regular rate and rhythm, No gallop; Respiratory: Breath sounds coarse & diminished bilaterally, No wheezes.  Speaking full sentences with ease, Normal respiratory effort/excursion; Chest: Nontender, Movement normal; Abdomen: Soft, Nontender,  Nondistended, Normal bowel sounds;; Extremities: Pulses normal, No tenderness, 1+ pedal edema R>L with calf asymmetry.; Neuro: AA&Ox3, Major CN grossly intact.  No facial droop. Speech clear. No drift x4 ext. No gross focal motor or sensory deficits in extremities.; Skin: Color normal, Warm, Dry, no rash.   ED Course  Procedures    MDM  MDM Reviewed: previous chart, nursing note and  vitals Reviewed previous: labs and ECG Interpretation: labs, ECG, x-ray and CT scan    Date: 09/24/2012  Rate: 76  Rhythm: normal sinus rhythm  QRS Axis: normal  Intervals: normal  ST/T Wave abnormalities: normal  Conduction Disutrbances:none  Narrative Interpretation: Tall R-wave in V2  Old EKG Reviewed: unchanged; no significant changes from EKG dated 07/01/2010 and 05/05/2010.  Results for orders placed during the hospital encounter of 09/24/12  BASIC METABOLIC PANEL      Component Value Range   Sodium 136  135 - 145 mEq/L   Potassium 3.9  3.5 - 5.1 mEq/L   Chloride 98  96 - 112 mEq/L   CO2 29  19 - 32 mEq/L   Glucose, Bld 101 (*) 70 - 99 mg/dL   BUN 11  6 - 23 mg/dL   Creatinine, Ser 7.84  0.50 - 1.35 mg/dL   Calcium 9.4  8.4 - 69.6 mg/dL   GFR calc non Af Amer 78 (*) >90 mL/min   GFR calc Af Amer >90  >90 mL/min  CBC WITH DIFFERENTIAL      Component Value Range   WBC 8.2  4.0 - 10.5 K/uL   RBC 5.26  4.22 - 5.81 MIL/uL   Hemoglobin 15.8  13.0 - 17.0 g/dL   HCT 29.5  28.4 - 13.2 %   MCV 87.3  78.0 - 100.0 fL   MCH 30.0  26.0 - 34.0 pg   MCHC 34.4  30.0 - 36.0 g/dL   RDW 44.0  10.2 - 72.5 %   Platelets 204  150 - 400 K/uL   Neutrophils Relative 71  43 - 77 %   Neutro Abs 5.8  1.7 - 7.7 K/uL   Lymphocytes Relative 20  12 - 46 %   Lymphs Abs 1.6  0.7 - 4.0 K/uL   Monocytes Relative 9  3 - 12 %   Monocytes Absolute 0.7  0.1 - 1.0 K/uL   Eosinophils Relative 1  0 - 5 %   Eosinophils Absolute 0.1  0.0 - 0.7 K/uL   Basophils Relative 0  0 - 1 %   Basophils Absolute 0.0  0.0 - 0.1 K/uL  PRO B NATRIURETIC PEPTIDE      Component Value Range   Pro B Natriuretic peptide (BNP) 30.2  0 - 125 pg/mL  TROPONIN I      Component Value Range   Troponin I <0.30  <0.30 ng/mL  URINALYSIS, ROUTINE W REFLEX MICROSCOPIC      Component Value Range   Color, Urine YELLOW  YELLOW   APPearance CLEAR  CLEAR   Specific Gravity, Urine >1.030 (*) 1.005 - 1.030   pH 5.5  5.0 - 8.0   Glucose,  UA NEGATIVE  NEGATIVE mg/dL   Hgb urine dipstick NEGATIVE  NEGATIVE   Bilirubin Urine NEGATIVE  NEGATIVE   Ketones, ur NEGATIVE  NEGATIVE mg/dL   Protein, ur NEGATIVE  NEGATIVE mg/dL   Urobilinogen, UA 0.2  0.0 - 1.0 mg/dL   Nitrite NEGATIVE  NEGATIVE   Leukocytes, UA NEGATIVE  NEGATIVE  PROTIME-INR  Component Value Range   Prothrombin Time 25.9 (*) 11.6 - 15.2 seconds   INR 2.51 (*) 0.00 - 1.49  PHENYTOIN LEVEL, TOTAL      Component Value Range   Phenytoin Lvl 9.3 (*) 10.0 - 20.0 ug/mL   Dg Chest 2 View 09/24/2012  *RADIOLOGY REPORT*  Clinical Data: Cough.  Evaluate for infiltrate or CHF.  Shortness of breath.  CHEST - 2 VIEW  Comparison: Chest x-ray 03/21/2012.  Findings: Chronic elevation of the left hemidiaphragm is similar to prior.  No definite acute consolidative airspace disease.  Trace left pleural effusion.  Pulmonary vasculature is normal.  Heart size appears borderline enlarged.  Mediastinal contours are unremarkable.  Atherosclerosis in the thoracic aorta.  IMPRESSION: 1.  New trace left pleural effusion. 2.  No evidence to suggest congestive heart failure. 3.  Atherosclerosis. 4.  Chronic elevation of the left hemidiaphragm is unchanged.   Original Report Authenticated By: Florencia Reasons, M.D.    Ct Head Wo Contrast 09/24/2012  *RADIOLOGY REPORT*  Clinical Data: Headache.  CT HEAD WITHOUT CONTRAST  Technique:  Contiguous axial images were obtained from the base of the skull through the vertex without contrast.  Comparison: The none.  Findings: No acute intracranial abnormality.  Specifically, no hemorrhage, hydrocephalus, mass lesion, acute infarction, or significant intracranial injury.  No acute calvarial abnormality. Visualized paranasal sinuses and mastoids clear.  Orbital soft tissues unremarkable.  IMPRESSION: No acute intracranial abnormality.   Original Report Authenticated By: Cyndie Chime, M.D.    Ct Angio Chest Pe W/cm &/or Wo Cm 09/24/2012  *RADIOLOGY REPORT*   Clinical Data: Shortness of breath, chest pain.  CT ANGIOGRAPHY CHEST  Technique:  Multidetector CT imaging of the chest using the standard protocol during bolus administration of intravenous contrast. Multiplanar reconstructed images including MIPs were obtained and reviewed to evaluate the vascular anatomy.  Contrast: OMNIPAQUE IOHEXOL 350 MG/ML SOLN  Comparison: 11/22/2008.  Findings: No filling defects in the pulmonary arteries to suggest pulmonary emboli. Heart is normal size. Aorta is normal caliber. No mediastinal, hilar, or axillary adenopathy.  Dependent and bibasilar atelectasis within the lungs.  No pleural or pericardial effusion.  Visualized thyroid and chest wall soft tissues unremarkable. Imaging into the upper abdomen shows no acute findings.  IMPRESSION: No evidence of pulmonary embolus.  Areas of atelectasis bilaterally, most pronounced bibasilar.   Original Report Authenticated By: Cyndie Chime, M.D.      1800:  Feels improved after neb and steroid and wants to go home now.  Due for his dilantin dose, will take after he goes home.  Pt has ambulated in the ED with steady gait, easy resps, Sats remaining 94-97% R/A.  CT-A chest negative for PE.  Doubt ACS with 3 to 4 days of constant chest pain, normal troponin and unchanged EKG x2 previous.  Pt has hx of chronic RLE edema from previous hx DVT; denies increasing RLE edema from his baseline or new LLE edema.  INR is therapeutic.  Pt already has MDI and abx, will add steroid.  Dx testing d/w pt and family.  Questions answered.  Verb understanding, agreeable to d/c home with outpt f/u.          Laray Anger, DO 09/26/12 1302

## 2012-09-24 NOTE — ED Notes (Signed)
Ambulated pt around nurses station; O2 level stayed between 94 and 97 %.FG,NT

## 2012-09-24 NOTE — ED Notes (Signed)
Pt c/o mid sternal chest pain that started this past Monday, pain is non radiating, pain is associate with sob, diaphoresis during the night,  denies any n/v, pt states that the pain comes and goes, was on vacation when the pain started, states that he came home early was seen by pcp yesterday, given antibiotics and inhaler. Pt states that the inhaler may help "some" but pain does not go away, pt states that he became dizzy yesterday and today and that is a new symptom, pt also has been having headaches that pt describes as a tightening around his head. Sob is worse with exertion.

## 2012-09-25 LAB — URINE CULTURE
Colony Count: NO GROWTH
Culture: NO GROWTH

## 2013-05-03 ENCOUNTER — Encounter: Payer: Self-pay | Admitting: Gastroenterology

## 2013-05-18 ENCOUNTER — Ambulatory Visit (INDEPENDENT_AMBULATORY_CARE_PROVIDER_SITE_OTHER): Payer: BC Managed Care – PPO | Admitting: Physician Assistant

## 2013-05-18 ENCOUNTER — Encounter: Payer: Self-pay | Admitting: Physician Assistant

## 2013-05-18 VITALS — BP 100/60 | HR 72 | Ht 68.5 in | Wt 208.4 lb

## 2013-05-18 DIAGNOSIS — M069 Rheumatoid arthritis, unspecified: Secondary | ICD-10-CM | POA: Insufficient documentation

## 2013-05-18 DIAGNOSIS — Z7901 Long term (current) use of anticoagulants: Secondary | ICD-10-CM | POA: Insufficient documentation

## 2013-05-18 DIAGNOSIS — Z86010 Personal history of colonic polyps: Secondary | ICD-10-CM | POA: Insufficient documentation

## 2013-05-18 DIAGNOSIS — G40909 Epilepsy, unspecified, not intractable, without status epilepticus: Secondary | ICD-10-CM | POA: Insufficient documentation

## 2013-05-18 DIAGNOSIS — K5730 Diverticulosis of large intestine without perforation or abscess without bleeding: Secondary | ICD-10-CM | POA: Insufficient documentation

## 2013-05-18 DIAGNOSIS — Z86718 Personal history of other venous thrombosis and embolism: Secondary | ICD-10-CM

## 2013-05-18 DIAGNOSIS — R110 Nausea: Secondary | ICD-10-CM

## 2013-05-18 MED ORDER — ONDANSETRON HCL 4 MG PO TABS: ORAL_TABLET | ORAL | Status: DC

## 2013-05-18 MED ORDER — MOVIPREP 100 G PO SOLR: 1.0000 | Freq: Once | ORAL | Status: DC

## 2013-05-18 NOTE — Patient Instructions (Addendum)
We have given you samples of the Nexium. Take 1 cap twice daily for 14 days then go to once daily. We have given you printed prescriptions for the colonoscopy prep ( Moviprep)  and the Zofran ( Ondansetron )  for nausea. You have been scheduled for a colonoscopy with propofol. Please follow written instructions given to you at your visit today.  We will call you once we hear from Dr. Phillips Odor regarding the coumadin medication.   Please pick up your prep kit at the pharmacy within the next 1-3 days. If you use inhalers (even only as needed), please bring them with you on the day of your procedure. Your physician has requested that you go to www.[email or web address].com and enter the access code given to you at your visit today. This web site gives a general overview about your procedure. However, you should still follow specific instructions given to you by our office regarding your preparation for the procedure.

## 2013-05-18 NOTE — Progress Notes (Signed)
Subjective:    Patient ID: [name], male    DOB: September 25, 1951, 62 y.o.   MRN: [id number]  HPI  [name] is a cousin 87 year old white male known to Dr. Russella Dar from previous procedures. He had EGD in 2011 which was normal and colonoscopy in May of 2009 showing left colon diverticulosis and a 5 mm sigmoid colon polyp which was removed. Path from the polyp was consistent with a tubular adenoma and he is scheduled for 5 year interval followup. He comes in today to discuss followup colonoscopy. He is now on chronic Coumadin after having 2 separate incidence of lower extremity DVT. He says he is not feeling very well currently and has been sick over the past 2-3 weeks with an upper respiratory infection. He says he feels fatigued and though respiratory symptoms are improved he is now quite nauseated and has no appetite. He had been given an injection of steroid and then took a Z-Pak followed by a course of Augmentin. He says once he started the Augmentin he started feeling nauseated and had an upset stomach with upper of abdominal  discomfort and was not able to complete a course. He initially had some diarrhea which has since resolved and his bowels are back to normal. He says he still has no appetite and feels queasy all the time. His energy level has been poor and he has been  forcing himself to eat. He is on chronic Nexium 40 mg by mouth daily for GERD symptoms. He has not been on any recent NSAIDs.    Review of Systems  Constitutional: Positive for appetite change and fatigue.  HENT: Negative.   Eyes: Negative.   Respiratory: Positive for cough.   Cardiovascular: Negative.   Gastrointestinal: Positive for nausea.  Endocrine: Negative.   Genitourinary: Negative.   Musculoskeletal: Negative.   Skin: Negative.   Allergic/Immunologic: Negative.   Neurological: Negative.   Hematological: Negative.   Psychiatric/Behavioral: Negative.    Outpatient Prescriptions Prior to Visit  Medication Sig  Dispense Refill  . Abatacept (ORENCIA IV) Inject into the vein once a week.      [name] albuterol (PROAIR HFA) 108 (90 BASE) MCG/ACT inhaler Inhale 2 puffs into the lungs every 6 (six) hours as needed. For shortness of breath      . doxycycline (VIBRA-TABS) 100 MG tablet Take 100 mg by mouth 2 (two) times daily.      . furosemide (LASIX) 40 MG tablet Take 80 mg by mouth at bedtime.      . phenytoin (DILANTIN) 100 MG ER capsule Take 100-200 mg by mouth 2 (two) times daily. Take one capsule in the morning and 2 capsules at bedtime      . warfarin (COUMADIN) 3 MG tablet Take 3-7.5 mg by mouth every evening. Take one tablet every evening on Mondays through Fridays. Take two and one-half tablets (7.5mg ) on Saturdays and Sundays      . amoxicillin-clavulanate (AUGMENTIN) 875-125 MG per tablet Take 1 tablet by mouth 2 (two) times daily. For 7days      . dicyclomine (BENTYL) 10 MG capsule Take 20 mg by mouth 2 (two) times daily.      . predniSONE (DELTASONE) 20 MG tablet Take 2 tablets (40 mg total) by mouth daily. Start 09/25/2012  10 tablet  0  . Probiotic Product (FLORAJEN3 PO) Take 1 capsule by mouth daily.       No facility-administered medications prior to visit.   Allergies  Allergen Reactions  . Levofloxacin Other (  See Comments)    REACTION: GI Intolerance BRAND NAME IS LEVAQUIN  . Carbamazepine Rash    REACTION: makes drowsy BRAND NAME IS TEGRETOL  . Celecoxib Itching and Rash    BRAND NAME IS CELEBREX  . Cephalexin Rash    BRAND NAME IS KEFLEX  . Enbrel (Etanercept) Rash  . Humira (Adalimumab) Rash  . Sulfa Antibiotics Rash   Patient Active Problem List   Diagnosis Date Noted  . Personal history of DVT (deep vein thrombosis) 05/18/2013  . Chronic anticoagulation 05/18/2013  . Diverticulosis of colon without hemorrhage 05/18/2013  . Rheumatoid arthritis 05/18/2013  . Seizure disorder 05/18/2013  . Hx of adenomatous colonic polyps 05/18/2013  . GERD 05/08/2010  . NAUSEA 05/08/2010   . FLATULENCE-GAS-BLOATING 05/08/2010   History  Substance Use Topics  . Smoking status: Former Games developer  . Smokeless tobacco: Never Used  . Alcohol Use: No   family history includes Alzheimer's disease in his mother; Breast cancer in his sister; Esophageal cancer in his brother; Skin cancer in his father; Stomach cancer in his father; and Throat cancer in his brother.     Objective:   Physical Exam Well-developed older white male in no acute distress, pleasant blood pressure 100/60 pulse 72 height 5 foot 8 weight 208. HEENT ;nontraumatic normocephalic EOMI PERRLA sclera anicteric, Neck;Supple no JVD, Cardiovascular; regular rate and rhythm with S1-S2 no murmur or gallop, Pulmonary; clear bilaterally, Abdomen; soft mildly tender in the epigastrium there is no guarding or rebound no palpable mass or hepatosplenomegaly bowel sounds are active, Rectal; exam not done, Extremities; no clubbing cyanosis or edema skin warm and dry, Psych; mood and affect normal and appropriate       Assessment & Plan:  #75 62 year old male due for followup colonoscopy with history of adenomatous colon polyp on prior colon May 2009. #2 patient now chronically anticoagulated with Coumadin #3 history of recurrent DVTs #4 rheumatoid arthritis #5 chronic GERD #6 history of seizure disorder #7 recent upper respiratory infection with residual fatigue, Anorexia, and nausea-which is likely secondary to antibiotics.  Plan; increase Nexium to 40 mg by mouth twice daily over the next 2 weeks Zofran 4 mg every 6 hours as needed for nausea Patient's is advised that if his symptoms are not improved in the next one week that he should call for further advice and may need to be reassessed Schedule for colonoscopy with Dr. Russella Dar in a few weeks to allow him time to recover from his upper respiratory infection. Procedure discussed in detail with the patient and he is agreeable to proceed. We will obtain consent from his internist  Dr. Renette Butters Sidney Ace for patient to stop Coumadin 5 days prior to his procedure-relative risk and benefit of holding anticoagulation also discussed with patient.

## 2013-05-18 NOTE — Progress Notes (Signed)
Reviewed and agree with management plan.  Jeremiyah Cullens T. Nysir Fergusson, MD FACG 

## 2013-05-21 ENCOUNTER — Telehealth: Payer: Self-pay | Admitting: *Deleted

## 2013-05-21 NOTE — Telephone Encounter (Signed)
I called the patient and advised him that Dr. Phillips Odor said he could be off the Coumadin 3 days prior to the procedure.  I told him to stop the Coumadin on 06-19-2013 and he can resume it on 06-23-2013.  He said he understood the instructions.

## 2013-05-25 ENCOUNTER — Telehealth: Payer: Self-pay | Admitting: Physician Assistant

## 2013-05-25 NOTE — Telephone Encounter (Signed)
Patient asking for something for his nerves.  "My stomach is jittery".  I have advised him this is something he should discuss with his primary care, but he says they are not in the office today.  Mike Gip PA please advise

## 2013-05-26 MED ORDER — GLYCOPYRROLATE 2 MG PO TABS: ORAL_TABLET | ORAL | Status: DC

## 2013-05-26 NOTE — Telephone Encounter (Signed)
Patient advised.

## 2013-05-26 NOTE — Telephone Encounter (Signed)
Would let him try robinul forte 2 mg  Once or twice daily  Short term- #30/0 refills

## 2013-05-27 ENCOUNTER — Ambulatory Visit (HOSPITAL_COMMUNITY)
Admission: RE | Admit: 2013-05-27 | Discharge: 2013-05-27 | Disposition: A | Discharge status: Home / Self Care | Payer: BC Managed Care – PPO | Source: Ambulatory Visit | Attending: Family Medicine | Admitting: Family Medicine

## 2013-05-27 ENCOUNTER — Other Ambulatory Visit (HOSPITAL_COMMUNITY): Payer: Self-pay | Admitting: Family Medicine

## 2013-05-27 DIAGNOSIS — R109 Unspecified abdominal pain: Secondary | ICD-10-CM

## 2013-05-27 DIAGNOSIS — K5909 Other constipation: Secondary | ICD-10-CM | POA: Insufficient documentation

## 2013-05-27 DIAGNOSIS — R110 Nausea: Secondary | ICD-10-CM | POA: Insufficient documentation

## 2013-06-21 ENCOUNTER — Telehealth: Payer: Self-pay | Admitting: Gastroenterology

## 2013-06-21 NOTE — Telephone Encounter (Signed)
Pt. States he stopped his Coumadin on Saturday in preparation for Colonoscopy.  He is schduled for tomorrow.  He won't ble to do his prep while at work tonight.  He thought his colonoscopy was Scheduled for Thursday,6/26.    He has planned to be off work Wednesday and Thursday to prep for colonoscopy and have procedure.  Will this be OK with you.  Please advise.

## 2013-06-21 NOTE — Telephone Encounter (Signed)
What is the question?  Please charge is he is cancelling for tomorrow.

## 2013-06-21 NOTE — Telephone Encounter (Signed)
[name] has already returned the call to the patietn

## 2013-06-21 NOTE — Telephone Encounter (Signed)
1555 Spoke with [name].  States that it is OK to cancel procedure for 6/24 and reschedule for 6/26.  Dr. Russella [name] advised pt. To be charged for cancellation tomorrow.  Pt. Was contacted with  Instructions regarding reschedule for 6/26.  He was advised to have clear liquids day before procedure and start prep at 1700 day before.  He was advised to continue second part of prep Day of procedure.  He was also advised to be here at 12:30 for a 1:30 app and to stop drinking at 10:30AM.  He verbalized understanding and indicated he was taking notes.  Encouraged To call if he has additional questions.  Pt. Advised that he would be charged a fee for cancelling procedure tomorrow.

## 2013-06-22 ENCOUNTER — Encounter: Payer: BC Managed Care – PPO | Admitting: Gastroenterology

## 2013-06-24 ENCOUNTER — Ambulatory Visit (AMBULATORY_SURGERY_CENTER): Payer: BC Managed Care – PPO | Admitting: Gastroenterology

## 2013-06-24 ENCOUNTER — Encounter: Payer: Self-pay | Admitting: Gastroenterology

## 2013-06-24 VITALS — BP 136/78 | HR 71 | Temp 97.9°F | Resp 18 | Ht 68.5 in | Wt 208.0 lb

## 2013-06-24 DIAGNOSIS — D126 Benign neoplasm of colon, unspecified: Secondary | ICD-10-CM

## 2013-06-24 DIAGNOSIS — Z860100 Personal history of colon polyps, unspecified: Secondary | ICD-10-CM

## 2013-06-24 MED ORDER — SODIUM CHLORIDE 0.9 % IV SOLN: 500.0000 mL | INTRAVENOUS | Status: DC

## 2013-06-24 NOTE — Progress Notes (Signed)
Lidocaine-40mg IV prior to Propofol InductionPropofol given over incremental dosages 

## 2013-06-24 NOTE — Progress Notes (Signed)
FMLA papers given to Marchelle Folks, Dr Anselm Jungling CMA and she is to get papers to appropriate person for completion. ewm

## 2013-06-24 NOTE — Op Note (Signed)
[facility] [address] [place], [address]   COLONOSCOPY PROCEDURE REPORT PATIENT: [name], [name]  MR#: [id number] BIRTHDATE: September 21, 1951 , 61  yrs. old GENDER: Male ENDOSCOPIST: Meryl Dare, MD, [facility] PROCEDURE DATE:  06/24/2013 PROCEDURE:   Colonoscopy with snare polypectomy ASA CLASS:   Class III INDICATIONS:Patient's personal history of adenomatous colon polyps and Last colonoscopy performed 5 years ago. MEDICATIONS: MAC sedation, administered by CRNA and propofol (Diprivan) 200mg  IV DESCRIPTION OF PROCEDURE:   After the risks benefits and alternatives of the procedure were thoroughly explained, informed consent was obtained.  A digital rectal exam revealed no abnormalities of the rectum.   The LB [id number]  endoscope was introduced through the anus and advanced to the cecum, which was identified by both the appendix and ileocecal valve. No adverse events experienced.   The quality of the prep was adequate, using MoviPrep  The instrument was then slowly withdrawn as the colon was fully examined.  COLON FINDINGS: A semi-pedunculated polyp measuring 7 mm in size was found at the cecum.  A polypectomy was performed with a cold snare. The resection was complete and the polyp tissue was completely retrieved.   Two sessile polyps measuring 5 mm in size were found in the ascending colon.  A polypectomy was performed with a cold snare.  The resection was complete and the polyp tissue was completely retrieved.   A sessile polyp measuring 5 mm in size was found in the descending colon.  A polypectomy was performed with a cold snare.  The resection was complete and the polyp tissue was completely retrieved.   Mild diverticulosis was noted in the sigmoid colon.   The colon was otherwise normal.  There was no diverticulosis, inflammation, polyps or cancers unless previously stated.  Retroflexed views revealed small internal hemorrhoids. The time to  cecum=2 minutes 04 seconds.  Withdrawal time=10 minutes 11 seconds.  The scope was withdrawn and the procedure completed. COMPLICATIONS: There were no complications. ENDOSCOPIC IMPRESSION: 1.   Semi-pedunculated polyp measuring 7 mm at the cecum; polypectomy performed with a cold snare 2.   Two sessile polyps measuring 5 mm in the ascending colon; polypectomy performed with a cold snare 3.   Sessile polyp measuring 5 mm in the descending colon; polypectomy performed with a cold snare 4.   Mild diverticulosis was noted in the sigmoid colon 5.   Small internal hemorrhoids RECOMMENDATIONS: 1.  Hold aspirin, aspirin products, and anti-inflammatory medication for 2 weeks. 2.  High fiber diet with liberal fluid intake. 3.  Repeat Colonoscopy in 3 years. 4.  Await pathology results 5.  Resume Coumadin tomorrow  eSigned:  Meryl Dare, MD, [facility] 06/24/2013 1:35 PM  cc: Assunta Found, MD

## 2013-06-24 NOTE — Patient Instructions (Addendum)
HOLD ASPIRIN, ASPIRIN PRODUCTS AND ANTIINFLAMATORIES X 2 WEEKS, July 10,2014.  RESUME COUMADIN TOMORROW, 6/27,2014.   YOU HAD AN ENDOSCOPIC PROCEDURE TODAY AT THE [facility]: Refer to the procedure report that was given to you for any specific questions about what was found during the examination.  If the procedure report does not answer your questions, please call your gastroenterologist to clarify.  If you requested that your care partner not be given the details of your procedure findings, then the procedure report has been included in a sealed envelope for you to review at your convenience later.  YOU SHOULD EXPECT: Some feelings of bloating in the abdomen. Passage of more gas than usual.  Walking can help get rid of the air that was put into your GI tract during the procedure and reduce the bloating. If you had a lower endoscopy (such as a colonoscopy or flexible sigmoidoscopy) you may notice spotting of blood in your stool or on the toilet paper. If you underwent a bowel prep for your procedure, then you may not have a normal bowel movement for a few days.  DIET: Your first meal following the procedure should be a light meal and then it is ok to progress to your normal diet.  A half-sandwich or bowl of soup is an example of a good first meal.  Heavy or fried foods are harder to digest and may make you feel nauseous or bloated.  Likewise meals heavy in dairy and vegetables can cause extra gas to form and this can also increase the bloating.  Drink plenty of fluids but you should avoid alcoholic beverages for 24 hours.  ACTIVITY: Your care partner should take you home directly after the procedure.  You should plan to take it easy, moving slowly for the rest of the day.  You can resume normal activity the day after the procedure however you should NOT DRIVE or use heavy machinery for 24 hours (because of the sedation medicines used during the test).    SYMPTOMS TO REPORT IMMEDIATELY: A  gastroenterologist can be reached at any hour.  During normal business hours, 8:30 AM to 5:00 PM Monday through Friday, call [phone].  After hours and on weekends, please call the GI answering service at [phone] who will take a message and have the physician on call contact you.   Following lower endoscopy (colonoscopy or flexible sigmoidoscopy):  Excessive amounts of blood in the stool  Significant tenderness or worsening of abdominal pains  Swelling of the abdomen that is new, acute  Fever of 100F or higher  FOLLOW UP: If any biopsies were taken you will be contacted by phone or by letter within the next 1-3 weeks.  Call your gastroenterologist if you have not heard about the biopsies in 3 weeks.  Our staff will call the home number listed on your records the next business day following your procedure to check on you and address any questions or concerns that you may have at that time regarding the information given to you following your procedure. This is a courtesy call and so if there is no answer at the home number and we have not heard from you through the emergency physician on call, we will assume that you have returned to your regular daily activities without incident.  SIGNATURES/CONFIDENTIALITY: You and/or your care partner have signed paperwork which will be entered into your electronic medical record.  These signatures attest to the fact that that the information above on  your After Visit Summary has been reviewed and is understood.  Full responsibility of the confidentiality of this discharge information lies with you and/or your care-partner. 

## 2013-06-24 NOTE — Progress Notes (Signed)
Called to room to assist during endoscopic procedure.  Patient ID and intended procedure confirmed with present staff. Received instructions for my participation in the procedure from the performing physician. ewm 

## 2013-06-24 NOTE — Progress Notes (Signed)
Patient did not experience any of the following events: a burn prior to discharge; a fall within the facility; wrong site/side/patient/procedure/implant event; or a hospital transfer or hospital admission upon discharge from the facility. (G8907) Patient did not have preoperative order for IV antibiotic SSI prophylaxis. (G8918)  

## 2013-06-25 ENCOUNTER — Telehealth: Payer: Self-pay

## 2013-06-25 NOTE — Telephone Encounter (Signed)
  Follow up Call-  Call back number 06/24/2013  Post procedure Call Back phone  # [phone]    [phone]  Permission to leave phone message Yes     Patient questions:  Do you have a fever, pain , or abdominal swelling? no Pain Score  0 *  Have you tolerated food without any problems? yes  Have you been able to return to your normal activities? yes  Do you have any questions about your discharge instructions: Diet   no Medications  no Follow up visit  no  Do you have questions or concerns about your Care? no  Actions: * If pain score is 4 or above: No action needed, pain <4.

## 2013-07-05 ENCOUNTER — Encounter: Payer: Self-pay | Admitting: Gastroenterology

## 2013-10-25 ENCOUNTER — Other Ambulatory Visit: Payer: Self-pay | Admitting: Cardiovascular Disease

## 2013-10-25 NOTE — Telephone Encounter (Signed)
Rx was sent to pharmacy electronically. 

## 2013-10-27 ENCOUNTER — Other Ambulatory Visit: Payer: Self-pay | Admitting: Cardiovascular Disease

## 2013-11-16 ENCOUNTER — Encounter: Payer: Self-pay | Admitting: Cardiovascular Disease

## 2013-11-16 ENCOUNTER — Ambulatory Visit (INDEPENDENT_AMBULATORY_CARE_PROVIDER_SITE_OTHER): Payer: BC Managed Care – PPO | Admitting: Cardiovascular Disease

## 2013-11-16 VITALS — BP 120/80 | HR 75 | Ht 69.0 in | Wt 221.0 lb

## 2013-11-16 DIAGNOSIS — I87099 Postthrombotic syndrome with other complications of unspecified lower extremity: Secondary | ICD-10-CM | POA: Insufficient documentation

## 2013-11-16 DIAGNOSIS — I87091 Postthrombotic syndrome with other complications of right lower extremity: Secondary | ICD-10-CM

## 2013-11-16 DIAGNOSIS — R9431 Abnormal electrocardiogram [ECG] [EKG]: Secondary | ICD-10-CM

## 2013-11-16 NOTE — Patient Instructions (Signed)
You should wear compression stockings whenever you are standing. When you are sitting down please always keep her legs elevated as much as possible. If you develop any sores in the skin that will not heal, report as soon as possible to your physician.  Your physician recommends that you schedule a follow-up appointment in: 1 year

## 2013-11-19 ENCOUNTER — Other Ambulatory Visit: Payer: Self-pay | Admitting: Cardiovascular Disease

## 2013-11-19 NOTE — Telephone Encounter (Signed)
Rx was sent to pharmacy electronically. 

## 2013-11-21 NOTE — Assessment & Plan Note (Signed)
[name] has severe postphlebitic syndrome of the right lower extremity. The superficial saphenous vein is patent and drains into a patent common femoral vein but otherwise both the superficial and profunda femoris are completely occluded. He has some changes of chronic venous insufficiency the skin. We discussed risk of venous stasis ulcers and the need to very promptly take care of any wounds. He should keep his legs elevated whenever possible and I have still encouraged him to wear support stockings. He should remain on chronic anticoagulation barring any bleeding complications.

## 2013-11-21 NOTE — Progress Notes (Signed)
Patient ID: [name], male   DOB: 06-22-1951, 62 y.o.   MRN: [id number]      Reason for office visit Chronic venous insufficiency, bilateral postphlebitic syndrome of the lower extremities  [name] returns for routine followup and has not had any serious problems since his last appointment. He continues to have intermittent swelling in his extremities (always a little worse in the right leg). He has bilateral varicose veins and chronic deep venous thrombosis especially extensive in the right side where it occupies the superficial femoral, profunda femoris and popliteal vein He has trouble with discomfort when he wears support stockings, and doesn't like to wear them. The edema never completely resolves. He has not had any new clotting problems and denies any bleeding issues while on treatment with warfarin. This is followed in [place].   He had benefit from initiation of bronchodilators which continued to help his breathing. He has chronic shortness of breath which appears to be secondary to combination of reactive airway disease/COPD, chronic left hemidiaphragm elevation of uncertain cause and questionable diastolic heart failure. Diuretic therapy did not help either his edema or shortness of breath. He has not had any chest pain, cough, hemoptysis. He is now on Orencia for rheumatoid arthritis.   Allergies  Allergen Reactions  . Levofloxacin Other (See Comments)    REACTION: GI Intolerance BRAND NAME IS LEVAQUIN  . Carbamazepine Rash    REACTION: makes drowsy BRAND NAME IS TEGRETOL  . Celecoxib Itching and Rash    BRAND NAME IS CELEBREX  . Cephalexin Rash    BRAND NAME IS KEFLEX  . Enbrel [Etanercept] Rash  . Humira [Adalimumab] Rash  . Sulfa Antibiotics Rash    Current Outpatient Prescriptions  Medication Sig Dispense Refill  . Abatacept (ORENCIA IV) Inject into the vein once a week.      [name] albuterol (PROAIR HFA) 108 (90 BASE) MCG/ACT inhaler Inhale 2 puffs into the  lungs every 6 (six) hours as needed. For shortness of breath      . allopurinol (ZYLOPRIM) 100 MG tablet Take 100 mg by mouth 2 (two) times daily.      . colchicine 0.6 MG tablet Take 0.6 mg by mouth daily.      [name] dicyclomine (BENTYL) 20 MG tablet Take 40 mg by mouth 2 (two) times daily.      [name] docusate sodium (COLACE) 100 MG capsule Take 100 mg by mouth 1 day or 1 dose.      . esomeprazole (NEXIUM) 40 MG capsule Take 40 mg by mouth daily before supper.      . phenytoin (DILANTIN) 100 MG ER capsule Take 100-200 mg by mouth 2 (two) times daily. Take one capsule in the morning and 2 capsules at bedtime      . warfarin (COUMADIN) 3 MG tablet Take 3-7.5 mg by mouth every evening. Take one tablet every evening on Mondays through Fridays. Take two and one-half tablets (7.5mg ) on Saturdays and Sundays      . furosemide (LASIX) 40 MG tablet TAKE 1 TABLET TWICE A DAY  60 tablet  5   No current facility-administered medications for this visit.    Past Medical History  Diagnosis Date  . Seizures   . GERD (gastroesophageal reflux disease)   . IBS (irritable bowel syndrome)   . DVT, lower extremity     right  . Arthritis   . DDD (degenerative disc disease), cervical     with UE's paresthesias  . Diverticulosis   .  Peripheral edema   . Sleep apnea     not on cpap    Past Surgical History  Procedure Laterality Date  . Cholecystectomy      Family History  Problem Relation Age of Onset  . Skin cancer Father   . Stomach cancer Father   . Alzheimer's disease Mother   . Throat cancer Brother   . Esophageal cancer Brother   . Breast cancer Sister     History   Social History  . Marital Status: Married    Spouse Name: N/A    Number of Children: 3  . Years of Education: N/A   Occupational History  . Armed forces logistics/support/administrative officer    Social History Main Topics  . Smoking status: Former Games developer  . Smokeless tobacco: Never Used  . Alcohol Use: No  . Drug Use: No  . Sexual Activity: Not on file    Other Topics Concern  . Not on file   Social History Narrative  . No narrative on file    Review of systems: The patient specifically denies any chest pain at rest or with exertion, orthopnea, paroxysmal nocturnal dyspnea, syncope, palpitations, focal neurological deficits, intermittent claudication, unexplained weight gain, cough, hemoptysis.  The patient also denies abdominal pain, nausea, vomiting, dysphagia, diarrhea, constipation, polyuria, polydipsia, dysuria, hematuria, frequency, urgency, abnormal bleeding or bruising, fever, chills, unexpected weight changes, mood swings, change in skin or hair texture, change in voice quality, auditory or visual problems, allergic reactions or rashes, new musculoskeletal complaints other than usual "aches and pains".   PHYSICAL EXAM BP 120/80  Pulse 75  Ht 5\' 9"  (1.753 m)  Wt 221 lb (100.245 kg)  BMI 32.62 kg/m2  General: Alert, oriented x3, no distress Head: no evidence of trauma, PERRL, EOMI, no exophtalmos or lid lag, no myxedema, no xanthelasma; normal ears, nose and oropharynx Neck: normal jugular venous pulsations and no hepatojugular reflux; brisk carotid pulses without delay and no carotid bruits Chest: clear to auscultation, no signs of consolidation by percussion or palpation, normal fremitus, symmetrical and full respiratory excursions Cardiovascular: normal position and quality of the apical impulse, regular rhythm, normal first and second heart sounds, no murmurs, rubs or gallops Abdomen: no tenderness or distention, no masses by palpation, no abnormal pulsatility or arterial bruits, normal bowel sounds, no hepatosplenomegaly Extremities: Bilateral edema of the counts, 2-3+ on the right, 1+ on the left; bilateral medium size varicose veins, complete loss of hair over his calves mild changes of stasis dermatitis a couple of scars of possible old ulcers are seen.; 2+ radial, ulnar and brachial pulses bilaterally; 2+ right femoral,  posterior tibial and dorsalis pedis pulses; 2+ left femoral, posterior tibial and dorsalis pedis pulses; no subclavian or femoral bruits Neurological: grossly nonfocal   EKG: NSR, tall R waves in leads V1 and V2, unchanged from previous tracings  Lipid Panel  No results found for this basename: chol, trig, hdl, cholhdl, vldl, ldlcalc    BMET    Component Value Date/Time   NA 136 09/24/2012 1511   K 3.9 09/24/2012 1511   CL 98 09/24/2012 1511   CO2 29 09/24/2012 1511   GLUCOSE 101* 09/24/2012 1511   BUN 11 09/24/2012 1511   CREATININE 1.01 09/24/2012 1511   CALCIUM 9.4 09/24/2012 1511   GFRNONAA 78* 09/24/2012 1511   GFRAA >90 09/24/2012 1511     ASSESSMENT AND PLAN No problem-specific assessment & plan notes found for this encounter.  Orders Placed This Encounter  Procedures  . EKG  12-Lead   Meds ordered this encounter  Medications  . dicyclomine (BENTYL) 20 MG tablet    Sig: Take 40 mg by mouth 2 (two) times daily.  [name] DISCONTD: furosemide (LASIX) 40 MG tablet    Sig: take 2 tablets once a day    Ibraheem Voris  Thurmon Fair, MD, [facility] [phone] office [phone] pager

## 2013-11-23 ENCOUNTER — Encounter: Payer: Self-pay | Admitting: Cardiovascular Disease

## 2014-02-12 ENCOUNTER — Ambulatory Visit (HOSPITAL_COMMUNITY)
Admission: RE | Admit: 2014-02-12 | Discharge: 2014-02-12 | Disposition: A | Discharge status: Home / Self Care | Payer: BC Managed Care – PPO | Source: Ambulatory Visit | Attending: Family Medicine | Admitting: Family Medicine

## 2014-02-12 ENCOUNTER — Other Ambulatory Visit (HOSPITAL_COMMUNITY): Payer: Self-pay | Admitting: Family Medicine

## 2014-02-12 DIAGNOSIS — R0602 Shortness of breath: Secondary | ICD-10-CM | POA: Insufficient documentation

## 2014-04-13 ENCOUNTER — Emergency Department (HOSPITAL_COMMUNITY)
Admission: EM | Admit: 2014-04-13 | Discharge: 2014-04-13 | Disposition: A | Discharge status: Home / Self Care | Payer: BC Managed Care – PPO | Attending: Emergency Medicine | Admitting: Emergency Medicine

## 2014-04-13 ENCOUNTER — Encounter (HOSPITAL_COMMUNITY): Payer: Self-pay | Admitting: Emergency Medicine

## 2014-04-13 ENCOUNTER — Emergency Department (HOSPITAL_COMMUNITY): Payer: BC Managed Care – PPO

## 2014-04-13 DIAGNOSIS — G40909 Epilepsy, unspecified, not intractable, without status epilepticus: Secondary | ICD-10-CM | POA: Insufficient documentation

## 2014-04-13 DIAGNOSIS — J209 Acute bronchitis, unspecified: Secondary | ICD-10-CM | POA: Insufficient documentation

## 2014-04-13 DIAGNOSIS — M129 Arthropathy, unspecified: Secondary | ICD-10-CM | POA: Insufficient documentation

## 2014-04-13 DIAGNOSIS — K219 Gastro-esophageal reflux disease without esophagitis: Secondary | ICD-10-CM | POA: Insufficient documentation

## 2014-04-13 DIAGNOSIS — K589 Irritable bowel syndrome without diarrhea: Secondary | ICD-10-CM | POA: Insufficient documentation

## 2014-04-13 DIAGNOSIS — Z79899 Other long term (current) drug therapy: Secondary | ICD-10-CM | POA: Insufficient documentation

## 2014-04-13 DIAGNOSIS — Z87891 Personal history of nicotine dependence: Secondary | ICD-10-CM | POA: Insufficient documentation

## 2014-04-13 DIAGNOSIS — R231 Pallor: Secondary | ICD-10-CM | POA: Insufficient documentation

## 2014-04-13 DIAGNOSIS — Z7901 Long term (current) use of anticoagulants: Secondary | ICD-10-CM | POA: Insufficient documentation

## 2014-04-13 DIAGNOSIS — Z86718 Personal history of other venous thrombosis and embolism: Secondary | ICD-10-CM | POA: Insufficient documentation

## 2014-04-13 DIAGNOSIS — J40 Bronchitis, not specified as acute or chronic: Secondary | ICD-10-CM

## 2014-04-13 LAB — CBC WITH DIFFERENTIAL/PLATELET
Basophils Absolute: 0 10*3/uL (ref 0.0–0.1)
Basophils Relative: 0 % (ref 0–1)
Eosinophils Absolute: 0 10*3/uL (ref 0.0–0.7)
Eosinophils Relative: 0 % (ref 0–5)
HCT: 45.9 % (ref 39.0–52.0)
Hemoglobin: 15.7 g/dL (ref 13.0–17.0)
Lymphocytes Relative: 9 % — ABNORMAL LOW (ref 12–46)
Lymphs Abs: 1.2 10*3/uL (ref 0.7–4.0)
MCH: 30.3 pg (ref 26.0–34.0)
MCHC: 34.2 g/dL (ref 30.0–36.0)
MCV: 88.6 fL (ref 78.0–100.0)
Monocytes Absolute: 1 10*3/uL (ref 0.1–1.0)
Monocytes Relative: 8 % (ref 3–12)
Neutro Abs: 10.5 10*3/uL — ABNORMAL HIGH (ref 1.7–7.7)
Neutrophils Relative %: 82 % — ABNORMAL HIGH (ref 43–77)
Platelets: 232 10*3/uL (ref 150–400)
RBC: 5.18 MIL/uL (ref 4.22–5.81)
RDW: 13.4 % (ref 11.5–15.5)
WBC: 12.7 10*3/uL — ABNORMAL HIGH (ref 4.0–10.5)

## 2014-04-13 LAB — BASIC METABOLIC PANEL
BUN: 10 mg/dL (ref 6–23)
CO2: 26 mEq/L (ref 19–32)
Calcium: 9.9 mg/dL (ref 8.4–10.5)
Chloride: 98 mEq/L (ref 96–112)
Creatinine, Ser: 1.09 mg/dL (ref 0.50–1.35)
GFR calc Af Amer: 82 mL/min — ABNORMAL LOW (ref >90)
GFR calc non Af Amer: 70 mL/min — ABNORMAL LOW (ref >90)
Glucose, Bld: 111 mg/dL — ABNORMAL HIGH (ref 70–99)
Potassium: 3.9 mEq/L (ref 3.7–5.3)
Sodium: 140 mEq/L (ref 137–147)

## 2014-04-13 LAB — TROPONIN I: Troponin I: <0.3 ng/mL (ref <0.30)

## 2014-04-13 MED ORDER — HYDROCOD POLST-CHLORPHEN POLST 10-8 MG/5ML PO LQCR
5.0000 mL | Freq: Once | ORAL | Status: AC
  Administered 2014-04-13: 5 mL via ORAL
  Filled 2014-04-13: qty 5

## 2014-04-13 MED ORDER — IPRATROPIUM-ALBUTEROL 0.5-2.5 (3) MG/3ML IN SOLN
3.0000 mL | Freq: Once | RESPIRATORY_TRACT | Status: AC
  Administered 2014-04-13: 3 mL via RESPIRATORY_TRACT
  Filled 2014-04-13: qty 3

## 2014-04-13 MED ORDER — HYDROCOD POLST-CHLORPHEN POLST 10-8 MG/5ML PO LQCR: 5.0000 mL | Freq: Two times a day (BID) | ORAL | Status: DC | PRN

## 2014-04-13 MED ORDER — ALBUTEROL SULFATE (2.5 MG/3ML) 0.083% IN NEBU
2.5000 mg | INHALATION_SOLUTION | Freq: Once | RESPIRATORY_TRACT | Status: AC
  Administered 2014-04-13: 2.5 mg via RESPIRATORY_TRACT
  Filled 2014-04-13: qty 3

## 2014-04-13 MED ORDER — SODIUM CHLORIDE 0.9 % IV BOLUS (SEPSIS)
1000.0000 mL | Freq: Once | INTRAVENOUS | Status: AC
  Administered 2014-04-13: 1000 mL via INTRAVENOUS

## 2014-04-13 MED ORDER — DOXYCYCLINE HYCLATE 100 MG PO CAPS: 100.0000 mg | ORAL_CAPSULE | Freq: Two times a day (BID) | ORAL | Status: DC

## 2014-04-13 MED ORDER — AZITHROMYCIN 250 MG PO TABS: ORAL_TABLET | ORAL | Status: DC

## 2014-04-13 NOTE — ED Notes (Signed)
Advised awaiting edp for re eval. Nad. Pt states he feels a little better.

## 2014-04-13 NOTE — ED Notes (Signed)
RT aware of breathing tx.

## 2014-04-13 NOTE — ED Notes (Signed)
Pt reported to PCP yesterday with complaints of flu-like symptoms; cough, diaphoreses at night, and shortness of breath. Pt denies hx of fever/pain. Pt continues to experience labored breathing and shortness of breath. Pt A&O and in NAD.

## 2014-04-13 NOTE — ED Notes (Signed)
Pt seen at PCP yesterday, DX with pneumonia and maybe flu, pt states he has been weak and running fevers for past few days and feels increasingly SOB.

## 2014-04-13 NOTE — ED Notes (Signed)
Dr Cook at bedside

## 2014-04-13 NOTE — Discharge Instructions (Signed)
Chest x-ray shows no obvious pneumonia. Increase fluids. Tylenol for fever. New prescription for antibiotic. Also prescription for cough syrup. Followup your Dr.

## 2014-04-13 NOTE — ED Provider Notes (Signed)
CSN: [id number]     Arrival date & time 04/13/14  1008 History  This chart was scribed for Nat Christen, MD by Ludger Nutting, ED Scribe. This patient was seen in room APA05/APA05 and the patient's care was started 10:52 AM.    Chief Complaint  Patient presents with  . Shortness of Breath      The history is provided by the patient. No language interpreter was used.    HPI Comments: [name] is a 63 y.o. male with past medical history of DVT who presents to the Emergency Department complaining of 6 days of gradual onset cough, wheezing, fever with associated generalized weakness. He states the SOB is worse with exertion. He was seen by PCP yesterday who was concerned he may have pneumonia. He was started on levofloxacin and prednisone at that time. He reports taking Lasix at home.   PCP [facility]   Past Medical History  Diagnosis Date  . Seizures   . GERD (gastroesophageal reflux disease)   . IBS (irritable bowel syndrome)   . DVT, lower extremity     right  . Arthritis   . DDD (degenerative disc disease), cervical     with UE's paresthesias  . Diverticulosis   . Peripheral edema   . Sleep apnea     not on cpap   Past Surgical History  Procedure Laterality Date  . Cholecystectomy     Family History  Problem Relation Age of Onset  . Skin cancer Father   . Stomach cancer Father   . Alzheimer's disease Mother   . Throat cancer Brother   . Esophageal cancer Brother   . Breast cancer Sister    History  Substance Use Topics  . Smoking status: Former Research scientist (life sciences)  . Smokeless tobacco: Never Used  . Alcohol Use: No    Review of Systems  A complete 10 system review of systems was obtained and all systems are negative except as noted in the HPI and PMH.    Allergies  Levofloxacin; Carbamazepine; Celecoxib; Cephalexin; Enbrel; Humira; and Sulfa antibiotics  Home Medications   Prior to Admission medications   Medication Sig Start Date End Date Taking? Authorizing  Provider  Abatacept (ORENCIA IV) Inject into the vein once a week.    Historical Provider, MD  albuterol (PROAIR HFA) 108 (90 BASE) MCG/ACT inhaler Inhale 2 puffs into the lungs every 6 (six) hours as needed. For shortness of breath    Historical Provider, MD  allopurinol (ZYLOPRIM) 100 MG tablet Take 100 mg by mouth 2 (two) times daily.    Historical Provider, MD  colchicine 0.6 MG tablet Take 0.6 mg by mouth daily.    Historical Provider, MD  dicyclomine (BENTYL) 20 MG tablet Take 40 mg by mouth 2 (two) times daily.    Historical Provider, MD  docusate sodium (COLACE) 100 MG capsule Take 100 mg by mouth 1 day or 1 dose. 06/02/13   Historical Provider, MD  esomeprazole (NEXIUM) 40 MG capsule Take 40 mg by mouth daily before supper.    Historical Provider, MD  furosemide (LASIX) 40 MG tablet TAKE 1 TABLET TWICE A DAY 11/19/13   Mihai Croitoru, MD  phenytoin (DILANTIN) 100 MG ER capsule Take 100-200 mg by mouth 2 (two) times daily. Take one capsule in the morning and 2 capsules at bedtime    Historical Provider, MD  warfarin (COUMADIN) 3 MG tablet Take 3-7.5 mg by mouth every evening. Take one tablet every evening on Mondays through Fridays.  Take two and one-half tablets (7.5mg ) on Saturdays and Sundays    Historical Provider, MD   BP 125/76  Pulse 82  Temp(Src) 99 F (37.2 C) (Oral)  Resp 18  Ht 5\' 9"  (1.753 m)  Wt 212 lb (96.163 kg)  BMI 31.29 kg/m2  SpO2 97% Physical Exam  Nursing note and vitals reviewed. Constitutional: He is oriented to person, place, and time. He appears well-developed and well-nourished.  HENT:  Head: Normocephalic and atraumatic.  Eyes: Conjunctivae and EOM are normal. Pupils are equal, round, and reactive to light.  Neck: Normal range of motion. Neck supple.  Cardiovascular: Normal rate, regular rhythm and normal heart sounds.   Pulmonary/Chest: Effort normal and breath sounds normal.  Abdominal: Soft. Bowel sounds are normal.  Musculoskeletal: Normal range of  motion.  Neurological: He is alert and oriented to person, place, and time.  Skin: Skin is warm and dry. There is pallor.  Psychiatric: He has a normal mood and affect. His behavior is normal.    ED Course  Procedures (including critical care time)  DIAGNOSTIC STUDIES: Oxygen Saturation is 98% on RA, normal by my interpretation.    COORDINATION OF CARE: 10:55 AM Will order CXR and lab work. Discussed treatment plan with pt at bedside and pt agreed to plan.   Labs Review Labs Reviewed  BASIC METABOLIC PANEL - Abnormal; Notable for the following:    Glucose, Bld 111 (*)    GFR calc non Af Amer 70 (*)    GFR calc Af Amer 82 (*)    All other components within normal limits  CBC WITH DIFFERENTIAL - Abnormal; Notable for the following:    WBC 12.7 (*)    Neutrophils Relative % 82 (*)    Neutro Abs 10.5 (*)    Lymphocytes Relative 9 (*)    All other components within normal limits  TROPONIN I    Imaging Review Dg Chest 2 View  04/13/2014   CLINICAL DATA:  SHORTNESS OF BREATH SHORTNESS OF BREATH  EXAM: CHEST  2 VIEW  COMPARISON:  DG CHEST 2 VIEW dated 02/12/2014  FINDINGS: Low lung volumes. Chronic elevation of the left hemidiaphragm. Linear increased density is appreciated in the left lung base. Lungs otherwise clear. Osseous structures unremarkable.  IMPRESSION: Atelectasis left lung base. Otherwise no acute cardiopulmonary disease.   Electronically Signed   By: Margaree Mackintosh M.D.   On: 04/13/2014 12:16     EKG Interpretation   Date/Time:  Wednesday April 13 2014 11:37:59 EDT Ventricular Rate:  81 PR Interval:  132 QRS Duration: 84 QT Interval:  378 QTC Calculation: 439 R Axis:   76 Text Interpretation:  Normal sinus rhythm Normal ECG When compared with  ECG of 24-Sep-2012 14:01, No significant change was found Confirmed by  Lacinda Axon  MD, Talesha Ellithorpe ([id number]) on 04/13/2014 12:29:04 PM      MDM   Final diagnoses:  Bronchitis    Patient feels better after albuterol Atrovent  breathing treatment. He claims to be allergic to Levaquin. Will change antibiotics as if it makes. Add Tussionex.   He is already on prednisone.  I personally performed the services described in this documentation, which was scribed in my presence. The recorded information has been reviewed and is accurate.   Nat Christen, MD 04/13/14 [phone]

## 2014-05-16 ENCOUNTER — Other Ambulatory Visit: Payer: Self-pay

## 2014-05-16 MED ORDER — FUROSEMIDE 40 MG PO TABS: 80.0000 mg | ORAL_TABLET | Freq: Every day | ORAL | Status: DC

## 2014-05-16 NOTE — Telephone Encounter (Signed)
Rx was sent to pharmacy electronically. 

## 2014-06-04 ENCOUNTER — Ambulatory Visit (HOSPITAL_COMMUNITY)
Admission: RE | Admit: 2014-06-04 | Discharge: 2014-06-04 | Disposition: A | Discharge status: Home / Self Care | Payer: BC Managed Care – PPO | Source: Ambulatory Visit | Attending: Family Medicine | Admitting: Family Medicine

## 2014-06-04 ENCOUNTER — Other Ambulatory Visit (HOSPITAL_COMMUNITY): Payer: Self-pay | Admitting: Family Medicine

## 2014-06-04 DIAGNOSIS — K219 Gastro-esophageal reflux disease without esophagitis: Secondary | ICD-10-CM | POA: Insufficient documentation

## 2014-06-04 DIAGNOSIS — J209 Acute bronchitis, unspecified: Secondary | ICD-10-CM

## 2014-06-04 DIAGNOSIS — R05 Cough: Secondary | ICD-10-CM | POA: Insufficient documentation

## 2014-06-04 DIAGNOSIS — J069 Acute upper respiratory infection, unspecified: Secondary | ICD-10-CM

## 2014-06-04 DIAGNOSIS — Z6831 Body mass index (BMI) 31.0-31.9, adult: Secondary | ICD-10-CM

## 2014-06-04 DIAGNOSIS — R059 Cough, unspecified: Secondary | ICD-10-CM | POA: Insufficient documentation

## 2014-07-05 ENCOUNTER — Ambulatory Visit (INDEPENDENT_AMBULATORY_CARE_PROVIDER_SITE_OTHER): Payer: BC Managed Care – PPO | Admitting: Cardiology

## 2014-07-05 ENCOUNTER — Ambulatory Visit: Payer: BC Managed Care – PPO | Admitting: Cardiology

## 2014-07-05 ENCOUNTER — Encounter: Payer: Self-pay | Admitting: *Deleted

## 2014-07-05 ENCOUNTER — Encounter (HOSPITAL_COMMUNITY): Payer: Self-pay | Admitting: *Deleted

## 2014-07-05 ENCOUNTER — Encounter: Payer: Self-pay | Admitting: Cardiology

## 2014-07-05 VITALS — BP 110/82 | HR 77 | Ht 69.0 in | Wt 206.4 lb

## 2014-07-05 DIAGNOSIS — R5381 Other malaise: Secondary | ICD-10-CM

## 2014-07-05 DIAGNOSIS — Z86718 Personal history of other venous thrombosis and embolism: Secondary | ICD-10-CM

## 2014-07-05 DIAGNOSIS — Z7901 Long term (current) use of anticoagulants: Secondary | ICD-10-CM

## 2014-07-05 DIAGNOSIS — R5383 Other fatigue: Secondary | ICD-10-CM

## 2014-07-05 DIAGNOSIS — R531 Weakness: Secondary | ICD-10-CM | POA: Insufficient documentation

## 2014-07-05 DIAGNOSIS — R079 Chest pain, unspecified: Secondary | ICD-10-CM

## 2014-07-05 DIAGNOSIS — R002 Palpitations: Secondary | ICD-10-CM

## 2014-07-05 NOTE — Patient Instructions (Signed)
Your physician has requested that you have en exercise stress myoview. For further information please visit [email or web address]. Please follow instruction sheet, as given.  Your physician has recommended that you wear an event monitor for 30 days. Event monitors are medical devices that record the heart's electrical activity. Doctors most often [place] these monitors to diagnose arrhythmias. Arrhythmias are problems with the speed or rhythm of the heartbeat. The monitor is a small, portable device. You can wear one while you do your normal daily activities. This is usually used to diagnose what is causing palpitations/syncope (passing out).  Your physician recommends that you schedule a follow-up appointment in September with Dr Sallyanne Kuster.

## 2014-07-05 NOTE — Assessment & Plan Note (Signed)
Will do treadmill Myoview, rule out ischemic disease for chest pain

## 2014-07-05 NOTE — Progress Notes (Signed)
07/05/2014   PCP: Purvis Kilts, MD   Chief Complaint  Patient presents with  . Palpitations    Patient states he could have anxiety or panic attacks. Patient reports chest tightness, weakness, and nervousness. C/o edema.    Primary Cardiologist:Dr. Bertrum Sol   HPI:  63 year old gentleman with a history of DVT bilaterally with low chronic venous insufficiency and lower ext edema is here today secondary to chest discomfort and episodes of an arrhythmia versus panic attack.    These episodes have been occurring for one and a half months. It did start when his brother was dying with cancer. Patient has since died.  He develops right chest tightness is just an uncomfortable feeling it can be there for long periods of time this seemed to increase with exertion.  I do not find any recent treadmill test but he does have strong family history of cardiac disease sees that they have died from cancer his father had an MI, Sister had an MI and 2 brothers with MIs.   He has no associated symptoms with the chest tightness. His other complaint was feeling. Strange and having to pull over when he is driving not aware of any significant heart arrhythmias. He saw his PCP who was concerned it could be an arrhythmia versus panic attack. Patient was placed on Lexapro though he is not taking on regular basis. He did have an similar episode last week.  PCP follows his Coumadin.   Allergies  Allergen Reactions  . Levofloxacin Other (See Comments)    REACTION: GI Intolerance BRAND NAME IS LEVAQUIN  . Carbamazepine Rash    REACTION: makes drowsy BRAND NAME IS TEGRETOL  . Celecoxib Itching and Rash    BRAND NAME IS CELEBREX  . Cephalexin Rash    BRAND NAME IS KEFLEX  . Enbrel [Etanercept] Rash  . Humira [Adalimumab] Rash  . Sulfa Antibiotics Rash    Current Outpatient Prescriptions  Medication Sig Dispense Refill  . Abatacept (ORENCIA IV) Inject into the vein once a week.      [name]  acetaminophen (TYLENOL) 325 MG tablet Take 650 mg by mouth once.      [name] albuterol (PROAIR HFA) 108 (90 BASE) MCG/ACT inhaler Inhale 2 puffs into the lungs every 6 (six) hours as needed. For shortness of breath      . allopurinol (ZYLOPRIM) 100 MG tablet Take 150 mg by mouth daily.       . colchicine 0.6 MG tablet Take 0.6 mg by mouth daily.      [name] dicyclomine (BENTYL) 10 MG capsule Take 40 mg by mouth 2 (two) times daily.      [name] docusate sodium (COLACE) 100 MG capsule Take 100 mg by mouth 1 day or 1 dose.      . escitalopram (LEXAPRO) 10 MG tablet Take 10 mg by mouth daily as needed.      [name] esomeprazole (NEXIUM) 40 MG capsule Take 40 mg by mouth daily before supper.      . furosemide (LASIX) 40 MG tablet Take 2 tablets (80 mg total) by mouth daily.  60 tablet  6  . phenytoin (DILANTIN) 100 MG ER capsule Take 100-200 mg by mouth 2 (two) times daily. Take one capsule in the morning and 2 capsules at bedtime      . warfarin (COUMADIN) 3 MG tablet Take 1.5-3 mg by mouth every evening.        No current facility-administered  medications for this visit.    Past Medical History  Diagnosis Date  . Seizures   . GERD (gastroesophageal reflux disease)   . IBS (irritable bowel syndrome)   . DVT, lower extremity     right  . Arthritis   . DDD (degenerative disc disease), cervical     with UE's paresthesias  . Diverticulosis   . Peripheral edema   . Sleep apnea     not on cpap    Past Surgical History  Procedure Laterality Date  . Cholecystectomy    . Doppler echocardiography N/A 01-21-2012    TECHNICALLY DIFFICULT. MILD CONCENTRIC LV HYPERTROPHY. LV CAVITY IS SMALL.. EF=> 55%. TRANSMITRAL SPECTRAL FLOW PATTREN IS SUGGESTIVE OF IMPAIRED LV RELAXATION. RV SYSTOLIC PRESSURE IS 22LNLG. LEFT ATRIAL SIZE IS NORMAL. AV APPEARS MILDLY SCLEROTIC. NO SIGN VALVE DISEASE NOTED.  . Nuclear stress test N/A 01-21-2012    NORMAL PATTERN OF PERFUSION IN ALL REGIONS. POST STRESS LV SIZE IS NORMAL. NO EVIDENCE OF  INDUCIBLE ISCHEMIA. EF 54%.  . US venous lower ext Right 03/07/11    PERSISTENT DVT IN RIGHT LOWER EXT.WITH PERSISTANT VISUALIZATION OF HYPOECHOIC THROMBUS WITHIN THE FEMORAL, PROFUNDA FEMORAL AND POPLITEAL VEINS. WHEN COMPARED TO PREVIOUS, CLOT IS NO LONGER IDENTIFIED WITH IN THE RIGHT CFV.    XQJ:JHERDEY:CX colds or fevers, + weight loss of 14 lbs, has cut back on food intake Skin:no rashes or ulcers HEENT:no blurred vision, no congestion CV:see HPI PUL:see HPI GI:no diarrhea constipation or melena, no indigestion GU:no hematuria, no dysuria MS:no joint pain, no claudication Neuro:no syncope, no lightheadedness, no seizures Endo:no diabetes, no thyroid disease  Wt Readings from Last 3 Encounters:  07/05/14 206 lb 6.4 oz (93.622 kg)  04/13/14 212 lb (96.163 kg)  11/16/13 221 lb (100.245 kg)    PHYSICAL EXAM BP 110/82  Pulse 77  Ht 5\' 9"  (1.753 m)  Wt 206 lb 6.4 oz (93.622 kg)  BMI 30.47 kg/m2 General:Pleasant affect, NAD Skin:Warm and dry, brisk capillary refill HEENT:normocephalic, sclera clear, mucus membranes moist Neck:supple, no JVD, no bruits  Heart:S1S2 RRR with premature beats without murmur, gallup, rub or click Lungs:clear without rales, rhonchi, or wheezes KGY:JEHU, non tender, + BS, do not palpate liver spleen or masses Ext:no lower ext edema, 2+ pedal pulses, 2+ radial pulses Neuro:alert and oriented, MAE, follows commands, + facial symmetry  EKG:SR early transition, no acute changes from 10/2013  ASSESSMENT AND PLAN Chest pain at rest Will do treadmill Myoview, rule out ischemic disease for chest pain  Weakness Episodes of feeling weak and has to pull over if driving. Rule out arrhythmic cause versus this could be panic attacks. Patient has never been diagnosed with panic attacks.   30 day event monitor to rule out arrhythmias. On exam he is having premature beats several times a minute.  Personal history of DVT (deep vein thrombosis) Stable  Chronic  anticoagulation  Followed by PCP.

## 2014-07-05 NOTE — Assessment & Plan Note (Addendum)
Episodes of feeling weak and has to pull over if driving. Rule out arrhythmic cause versus this could be panic attacks. Patient has never been diagnosed with panic attacks.   30 day event monitor to rule out arrhythmias. On exam he is having premature beats several times a minute.

## 2014-07-05 NOTE — Assessment & Plan Note (Signed)
Followed by PCP

## 2014-07-05 NOTE — Assessment & Plan Note (Signed)
Stable

## 2014-07-15 ENCOUNTER — Telehealth (HOSPITAL_COMMUNITY): Payer: Self-pay

## 2014-07-15 NOTE — Telephone Encounter (Signed)
Encounter complete. 

## 2014-07-19 ENCOUNTER — Telehealth (HOSPITAL_COMMUNITY): Payer: Self-pay

## 2014-07-19 NOTE — Telephone Encounter (Signed)
Encounter complete. 

## 2014-07-20 ENCOUNTER — Ambulatory Visit (HOSPITAL_COMMUNITY)
Admission: RE | Admit: 2014-07-20 | Discharge: 2014-07-20 | Disposition: A | Discharge status: Home / Self Care | Payer: BC Managed Care – PPO | Source: Ambulatory Visit | Attending: Internal Medicine | Admitting: Internal Medicine

## 2014-07-20 DIAGNOSIS — R079 Chest pain, unspecified: Secondary | ICD-10-CM

## 2014-07-20 DIAGNOSIS — R002 Palpitations: Secondary | ICD-10-CM | POA: Insufficient documentation

## 2014-07-20 MED ORDER — TECHNETIUM TC 99M SESTAMIBI GENERIC - CARDIOLITE
10.7000 | Freq: Once | INTRAVENOUS | Status: AC | PRN
  Administered 2014-07-20: 11 via INTRAVENOUS

## 2014-07-20 MED ORDER — TECHNETIUM TC 99M SESTAMIBI GENERIC - CARDIOLITE
30.5000 | Freq: Once | INTRAVENOUS | Status: AC | PRN
  Administered 2014-07-20: 31 via INTRAVENOUS

## 2014-07-20 NOTE — Procedures (Addendum)
[facility] [address] [place] [address] [phone]  Cardiology Nuclear Med Study  [name] is a 63 y.o. male     MRN : [id number]     DOB: March 20, 1951  Procedure Date: 07/20/2014  Nuclear Med Background Indication for Stress Test:  Evaluation for Ischemia History:  COPD and No prior cardiac history reported;seizures;Last NUC MPI on 01/21/2012-EF=54% Cardiac Risk Factors: Family History - CAD and Overweight  Symptoms:  Chest Pain, Dizziness, DOE, Fatigue, Light-Headedness, Nausea, Palpitations and SOB   Nuclear Pre-Procedure Caffeine/Decaff Intake:  9:00pm NPO After: 7:00am   IV Site: R Forearm  IV 0.9% NS with Angio Cath:  22g  Chest Size (in):  44"  IV Started by: Rolene Course, RN  Height: 5\' 9"  (1.753 m)  Cup Size: n/a  BMI:  Body mass index is 30.41 kg/(m^2). Weight:  206 lb (93.441 kg)   Tech Comments:  n/a    Nuclear Med Study 1 or 2 day study: 1 day  Stress Test Type:  Stress  Order Authorizing Provider:  [name], MD   Resting Radionuclide: Technetium 4m Sestamibi  Resting Radionuclide Dose: 10.7 mCi   Stress Radionuclide:  Technetium 60m Sestamibi  Stress Radionuclide Dose: 30.5 mCi           Stress Protocol Rest HR: 71 Stress HR: 142  Rest BP: 130/98 Stress BP: 201/93  Exercise Time (min): 8:16 METS: 8.70   Predicted Max HR: 157 bpm % Max HR: 90.45 bpm Rate Pressure Product: 17915  Dose of Adenosine (mg):  n/a Dose of Lexiscan: n/a mg  Dose of Atropine (mg): n/a Dose of Dobutamine: n/a mcg/kg/min (at max HR)  Stress Test Technologist: Mellody Memos, CCT Nuclear Technologist: Imagene Riches, CNMT   Rest Procedure:  Myocardial perfusion imaging was performed at rest 45 minutes following the intravenous administration of Technetium 49m Sestamibi. Stress Procedure:  The patient performed treadmill exercise using a Bruce  Protocol for 8 minutes 16 seconds. The patient stopped due to  extreme shortness of breath and fatigue. Patient denied any chest pain.  There were no significant ST-T wave changes.  Technetium 50m Sestamibi was injected at peak exercise and myocardial perfusion imaging was performed after a brief delay.  Transient Ischemic Dilatation (Normal <1.22):  0.82  QGS EDV:  90 ml QGS ESV:  38 ml LV Ejection Fraction: 58%  Rest ECG: NSR - Normal EKG  Stress ECG: No significant ST segment change suggestive of ischemia.  QPS Raw Data Images:  Normal; no motion artifact; normal heart/lung ratio. Mild increased uptake of tracer in the left chest. Stress Images:  Small apical perfusion defect Rest Images:  Apical and lateral defect with overlying bowel Subtraction (SDS):  No evidence of ischemia.  Impression Exercise Capacity:  Good exercise capacity. BP Response:  Hypertensive blood pressure response. Clinical Symptoms:  No significant symptoms noted. ECG Impression:  No significant ST segment change suggestive of ischemia. Comparison with Prior Nuclear Study: No significant change from previous study  Overall Impression:  Low risk stress nuclear study with bowel attenuation artifact. No reversible ischemia. Extracardiac tracer uptake noted in the left chest - patient noted to have elevated L hemidiaphragm. CXR in 05/2014 does not show mass in this area. Clinical correlation advised.  LV Wall Motion:  NL LV Function; NL Wall Motion; EF 58%.  Pixie Casino, MD, [facility] Board Certified in Nuclear Cardiology Attending Cardiologist Dewey [place], MD  07/20/2014 1:06  PM       

## 2014-08-23 ENCOUNTER — Encounter: Payer: Self-pay | Admitting: Gastroenterology

## 2014-09-12 ENCOUNTER — Encounter: Payer: Self-pay | Admitting: Cardiovascular Disease

## 2014-09-12 ENCOUNTER — Ambulatory Visit (INDEPENDENT_AMBULATORY_CARE_PROVIDER_SITE_OTHER): Payer: BC Managed Care – PPO | Admitting: Cardiovascular Disease

## 2014-09-12 VITALS — BP 133/88 | HR 75 | Resp 16 | Ht 69.0 in | Wt 219.8 lb

## 2014-09-12 DIAGNOSIS — Z86718 Personal history of other venous thrombosis and embolism: Secondary | ICD-10-CM

## 2014-09-12 DIAGNOSIS — I87093 Postthrombotic syndrome with other complications of bilateral lower extremity: Secondary | ICD-10-CM

## 2014-09-12 DIAGNOSIS — R079 Chest pain, unspecified: Secondary | ICD-10-CM

## 2014-09-12 DIAGNOSIS — I87009 Postthrombotic syndrome without complications of unspecified extremity: Secondary | ICD-10-CM

## 2014-09-12 DIAGNOSIS — Z7901 Long term (current) use of anticoagulants: Secondary | ICD-10-CM

## 2014-09-12 NOTE — Patient Instructions (Addendum)
Dr. Sallyanne Kuster recommends that you schedule a follow-up appointment in: One year.  Compression Stockings Compression stockings are elastic stockings that "compress" your legs. This helps to increase blood flow, decrease swelling, and reduces the chance of getting blood clots in your lower legs. Compression stockings are used:  After surgery.  If you have a history of poor circulation.  If you are prone to blood clots.  If you have varicose veins.  If you sit or are bedridden for long periods of time. WEARING COMPRESSION STOCKINGS  Your compression stockings should be worn as instructed by your caregiver.  Wearing the correct stocking size is important. Your caregiver can help measure and fit you to the correct size.  When wearing your stockings, do not allow the stockings to bunch up. This is especially important around your toes or behind your knees. Keep the stockings as smooth as possible.  Do not roll the stockings downward and leave them rolled down. This can form a restrictive band around your legs and can decrease blood flow.  The stockings should be removed once a day for 1 hour or as instructed by your caregiver. When the stockings are taken off, inspect your legs and feet. Look for:  Open sores.  Red spots.  Puffy areas (swelling).  Anything that does not seem normal. IMPORTANT INFORMATION ABOUT COMPRESSION STOCKINGS  The compression stockings should be clean, dry, and in good condition before you put them on.  Do not put lotion on your legs or feet. This makes it harder to put the stockings on.  Change your stockings immediately if they become wet or soiled.  Do not wear stockings that are ripped or torn.  You may hand-wash or put your stockings in the washing machine. Use cold or warm water with mild detergent. Do not bleach your stockings. They may be air-dried or dried in the dryer on low heat.  If you have pain or have a feeling of "pins and needles" in your  feet or legs, you may be wearing stockings that are too tight. Call your caregiver right away. SEEK IMMEDIATE MEDICAL CARE IF:   You have numbness or tingling in your lower legs that does not get better quickly after the stockings are removed.  Your toes or feet become cold and blue.  You develop open sores or have red spots on your legs that do not go away. MAKE SURE YOU:   Understand these instructions.  Will watch your condition.  Will get help right away if you are not doing well or get worse. Document Released: 10/13/2009 Document Revised: 03/09/2012 Document Reviewed: 10/13/2009 [facility] Patient Information 2015 [place], [place]. This information is not intended to replace advice given to you by your health care provider. Make sure you discuss any questions you have with your health care provider.

## 2014-09-12 NOTE — Progress Notes (Signed)
Patient ID: [name], male   DOB: August 25, 1951, 63 y.o.   MRN: [id number]      Reason for office visit Postphlebitic syndrome, stress test and event monitor follow up  [name] is a 63 year old man with severe bilateral lower extremity edema related to postphlebitic syndrome (chronic organized DVT, right worse than left), COPD, left hemidiaphragm paralysis of uncertain etiology, questionable diastolic heart failure, rheumatoid arthritis who returns for followup after seeing [name] on July 7. At that time he described atypical chest discomfort and dizziness/presyncope. He underwent a nuclear perfusion study that shows normal myocardial perfusion. He wore 30 day event monitor but had no symptoms while wearing it. The only strip submitted for review showed a single isolated PVC. There was no evidence of meaningful bradycardia or tachycardia.  His only complaint today is lower extremity edema and indeed both legs are more swollen than in the past. The skin is cyanotic. The swelling is 3+ on the right and 2+ on the left. On the right side he has a small scar from what seems to be a healed venous stasis ulcer. He believes the swelling is worse since he has been working longer hours. After overnight recumbent position the swelling resolves. Even during the day, if he can keep his legs elevated, the edema is not so bad.   Allergies  Allergen Reactions  . Levofloxacin Other (See Comments)    REACTION: GI Intolerance BRAND NAME IS LEVAQUIN  . Carbamazepine Rash    REACTION: makes drowsy BRAND NAME IS TEGRETOL  . Celecoxib Itching and Rash    BRAND NAME IS CELEBREX  . Cephalexin Rash    BRAND NAME IS KEFLEX  . Enbrel [Etanercept] Rash  . Humira [Adalimumab] Rash  . Sulfa Antibiotics Rash    Current Outpatient Prescriptions  Medication Sig Dispense Refill  . Abatacept (ORENCIA IV) Inject into the vein once a week.      [name] acetaminophen (TYLENOL) 325 MG tablet Take 650 mg by mouth  once.      [name] albuterol (PROAIR HFA) 108 (90 BASE) MCG/ACT inhaler Inhale 2 puffs into the lungs every 6 (six) hours as needed. For shortness of breath      . allopurinol (ZYLOPRIM) 100 MG tablet Take 150 mg by mouth daily.       . colchicine 0.6 MG tablet Take 0.6 mg by mouth daily.      [name] dicyclomine (BENTYL) 10 MG capsule Take 40 mg by mouth 2 (two) times daily.      [name] docusate sodium (COLACE) 100 MG capsule Take 100 mg by mouth 1 day or 1 dose.      . escitalopram (LEXAPRO) 10 MG tablet Take 10 mg by mouth daily as needed.      [name] esomeprazole (NEXIUM) 40 MG capsule Take 40 mg by mouth daily before supper.      . furosemide (LASIX) 40 MG tablet Take 2 tablets (80 mg total) by mouth daily.  60 tablet  6  . phenytoin (DILANTIN) 100 MG ER capsule Take 100-200 mg by mouth 2 (two) times daily. Take one capsule in the morning and 2 capsules at bedtime      . warfarin (COUMADIN) 3 MG tablet Take 1.5-3 mg by mouth every evening.        No current facility-administered medications for this visit.    Past Medical History  Diagnosis Date  . Seizures   . GERD (gastroesophageal reflux disease)   . IBS (irritable bowel syndrome)   .  DVT, lower extremity     right  . Arthritis   . DDD (degenerative disc disease), cervical     with UE's paresthesias  . Diverticulosis   . Peripheral edema   . Sleep apnea     not on cpap    Past Surgical History  Procedure Laterality Date  . Cholecystectomy    . Doppler echocardiography N/A 01-21-2012    TECHNICALLY DIFFICULT. MILD CONCENTRIC LV HYPERTROPHY. LV CAVITY IS SMALL.. EF=> 55%. TRANSMITRAL SPECTRAL FLOW PATTREN IS SUGGESTIVE OF IMPAIRED LV RELAXATION. RV SYSTOLIC PRESSURE IS 01VCBS. LEFT ATRIAL SIZE IS NORMAL. AV APPEARS MILDLY SCLEROTIC. NO SIGN VALVE DISEASE NOTED.  . Nuclear stress test N/A 01-21-2012    NORMAL PATTERN OF PERFUSION IN ALL REGIONS. POST STRESS LV SIZE IS NORMAL. NO EVIDENCE OF INDUCIBLE ISCHEMIA. EF 54%.  . US venous lower ext Right  03/07/11    PERSISTENT DVT IN RIGHT LOWER EXT.WITH PERSISTANT VISUALIZATION OF HYPOECHOIC THROMBUS WITHIN THE FEMORAL, PROFUNDA FEMORAL AND POPLITEAL VEINS. WHEN COMPARED TO PREVIOUS, CLOT IS NO LONGER IDENTIFIED WITH IN THE RIGHT CFV.    Family History  Problem Relation Age of Onset  . Skin cancer Father   . Stomach cancer Father   . Heart attack Father   . Alzheimer's disease Mother   . Throat cancer Brother   . Heart attack Brother   . Esophageal cancer Brother   . Heart attack Brother   . Breast cancer Sister   . Heart attack Sister   . Throat cancer Brother     History   Social History  . Marital Status: Married    Spouse Name: N/A    Number of Children: 3  . Years of Education: N/A   Occupational History  . Biomedical engineer    Social History Main Topics  . Smoking status: Former Research scientist (life sciences)  . Smokeless tobacco: Never Used  . Alcohol Use: No  . Drug Use: No  . Sexual Activity: Not on file   Other Topics Concern  . Not on file   Social History Narrative  . No narrative on file    Review of systems: The patient specifically denies any chest pain at rest or with exertion, dyspnea at rest or with exertion, orthopnea, paroxysmal nocturnal dyspnea, syncope, palpitations, focal neurological deficits, intermittent claudication, unexplained weight gain, cough, hemoptysis or wheezing.  The patient also denies abdominal pain, nausea, vomiting, dysphagia, diarrhea, constipation, polyuria, polydipsia, dysuria, hematuria, frequency, urgency, abnormal bleeding or bruising, fever, chills, unexpected weight changes, mood swings, change in skin or hair texture, change in voice quality, auditory or visual problems, allergic reactions or rashes, new musculoskeletal complaints other than usual "aches and pains".   PHYSICAL EXAM BP 133/88  Pulse 75  Resp 16  Ht 5\' 9"  (1.753 m)  Wt 219 lb 12.8 oz (99.701 kg)  BMI 32.44 kg/m2 General: Alert, oriented x3, no distress  Head: no  evidence of trauma, PERRL, EOMI, no exophtalmos or lid lag, no myxedema, no xanthelasma; normal ears, nose and oropharynx  Neck: normal jugular venous pulsations and no hepatojugular reflux; brisk carotid pulses without delay and no carotid bruits  Chest: clear to auscultation, no signs of consolidation by percussion or palpation, normal fremitus, symmetrical and full respiratory excursions  Cardiovascular: normal position and quality of the apical impulse, regular rhythm, normal first and second heart sounds, no murmurs, rubs or gallops  Abdomen: no tenderness or distention, no masses by palpation, no abnormal pulsatility or arterial bruits, normal bowel sounds, no hepatosplenomegaly  Extremities: Bilateral edema of the counts, 2-3+ on the right, 1+ on the left; bilateral medium size varicose veins, complete loss of hair over his calves mild changes of stasis dermatitis a couple of scars of possible old ulcers are seen.; 2+ radial, ulnar and brachial pulses bilaterally; 2+ right femoral, posterior tibial and dorsalis pedis pulses; 2+ left femoral, posterior tibial and dorsalis pedis pulses; no subclavian or femoral bruits  Neurological: grossly nonfocal   BMET    Component Value Date/Time   NA 140 04/13/2014 1117   K 3.9 04/13/2014 1117   CL 98 04/13/2014 1117   CO2 26 04/13/2014 1117   GLUCOSE 111* 04/13/2014 1117   BUN 10 04/13/2014 1117   CREATININE 1.09 04/13/2014 1117   CALCIUM 9.9 04/13/2014 1117   GFRNONAA 70* 04/13/2014 1117   GFRAA 82* 04/13/2014 1117     ASSESSMENT AND PLAN  His problems with chest discomfort and dizziness seemed to resolve and he had a normal nuclear stress test and an unremarkable 30 day event monitor.  His biggest problem remains bilateral postphlebitic syndrome worse on the right than the left. I think he needs to wear compression stockings consistently, especially when he is at work.  He does not have other findings to suggest right heart failure today. I suspect  that most of the edema is due to local drainage problems. .patin Holli Humbles, MD, [place] [phone] office [phone] pager

## 2014-12-24 ENCOUNTER — Other Ambulatory Visit: Payer: Self-pay | Admitting: Cardiovascular Disease

## 2014-12-26 NOTE — Telephone Encounter (Signed)
Rx(s) sent to pharmacy electronically.  

## 2015-03-08 ENCOUNTER — Ambulatory Visit (HOSPITAL_COMMUNITY)
Admission: RE | Admit: 2015-03-08 | Discharge: 2015-03-08 | Disposition: A | Discharge status: Home / Self Care | Payer: BLUE CROSS/BLUE SHIELD | Source: Ambulatory Visit | Attending: Physician Assistant | Admitting: Physician Assistant

## 2015-03-08 ENCOUNTER — Other Ambulatory Visit (HOSPITAL_COMMUNITY): Payer: Self-pay | Admitting: Physician Assistant

## 2015-03-08 DIAGNOSIS — R079 Chest pain, unspecified: Secondary | ICD-10-CM | POA: Diagnosis not present

## 2015-03-08 DIAGNOSIS — M549 Dorsalgia, unspecified: Secondary | ICD-10-CM

## 2015-03-08 DIAGNOSIS — M7989 Other specified soft tissue disorders: Secondary | ICD-10-CM | POA: Insufficient documentation

## 2015-03-08 DIAGNOSIS — R600 Localized edema: Secondary | ICD-10-CM

## 2015-03-08 DIAGNOSIS — R0602 Shortness of breath: Secondary | ICD-10-CM | POA: Insufficient documentation

## 2015-03-08 DIAGNOSIS — R05 Cough: Secondary | ICD-10-CM | POA: Insufficient documentation

## 2015-07-04 ENCOUNTER — Encounter: Payer: Self-pay | Admitting: Radiation Oncology

## 2015-07-04 DIAGNOSIS — C61 Malignant neoplasm of prostate: Secondary | ICD-10-CM | POA: Insufficient documentation

## 2015-07-04 NOTE — Progress Notes (Signed)
GU Location of Tumor / Histology: prostatic adenocarcinoma  If Prostate Cancer, Gleason Score is (3 + 4) and PSA is (6)  [name] presented 05/18/2015 to Dr. Hilma Favors with a PSA of 6.0 and no former hx of PSA draws or biopsy.  Biopsies of prostate (if applicable) revealed:   Past/Anticipated interventions by urology, if any: prostate biopsy, prostate volume 24 cc, and referral to Dr. Tammi Klippel   Past/Anticipated interventions by medical oncology, if any: no  Weight changes, if any: no  Bowel/Bladder complaints, if any: nocturia predominantly due to diuretics. No issues with erections. Sister had bladder ca.   Nausea/Vomiting, if any: no  Pain issues, if any:  no  SAFETY ISSUES:  Prior radiation? no  Pacemaker/ICD? no  Possible current pregnancy? no  Is the patient on methotrexate? no  Current Complaints / other details:  64 year old male. Married with 1 son and 2 daughter. Works as a Dealer.

## 2015-07-04 NOTE — Progress Notes (Signed)
Radiation Oncology         [phone] ________________________________  Initial Outpatient Consultation  Name: [name] MRN: [id number]  Date: 07/05/2015  DOB: 1951/05/04  YQ:MVHQION, Mark Coder, MD  McKenzie, Candee Furbish, MD   REFERRING PHYSICIAN: [name], MD  DIAGNOSIS: 64 y.o. gentleman with stage T1c adenocarcinoma of the prostate with a Gleason's score of 3+4 and a PSA of 6.0    ICD-9-CM ICD-10-CM   1. Malignant neoplasm of prostate [name] is a 64 y.o. gentleman.  He was noted to have an elevated PSA of 6.0 by his primary care physician, Dr. Hilma Favors.  Accordingly, he was referred for evaluation in urology by Dr. Alyson Ingles on 05/18/15,  digital rectal examination was performed at that time revealing a 20 gm prostate with no nodule.  The patient proceeded to transrectal ultrasound with 12 biopsies of the prostate on 06/15/15.  The prostate volume measured 24 cc.  Out of 12 core biopsies,6 were positive.  The maximum Gleason score was 3+4, and this was seen in the left gland as displayed in the image below.  The patient reviewed the biopsy results with his urologist and he has kindly been referred today for discussion of potential radiation treatment options.  PREVIOUS RADIATION THERAPY: No  PAST MEDICAL HISTORY:  has a past medical history of Seizures; GERD (gastroesophageal reflux disease); IBS (irritable bowel syndrome); DVT, lower extremity; Arthritis; DDD (degenerative disc disease), cervical; Diverticulosis; Peripheral edema; Sleep apnea; Prostate cancer; Anxiety; Asthma; Gout; Hypercholesterolemia; and IBS (irritable bowel syndrome).    PAST SURGICAL HISTORY: Past Surgical History  Procedure Laterality Date  . Cholecystectomy    . Doppler echocardiography N/A 01-21-2012    TECHNICALLY DIFFICULT. MILD CONCENTRIC LV HYPERTROPHY. LV CAVITY IS SMALL.. EF=> 55%. TRANSMITRAL SPECTRAL FLOW PATTREN IS SUGGESTIVE OF  IMPAIRED LV RELAXATION. RV SYSTOLIC PRESSURE IS 62XBMW. LEFT ATRIAL SIZE IS NORMAL. AV APPEARS MILDLY SCLEROTIC. NO SIGN VALVE DISEASE NOTED.  . Nuclear stress test N/A 01-21-2012    NORMAL PATTERN OF PERFUSION IN ALL REGIONS. POST STRESS LV SIZE IS NORMAL. NO EVIDENCE OF INDUCIBLE ISCHEMIA. EF 54%.  . US venous lower ext Right 03/07/11    PERSISTENT DVT IN RIGHT LOWER EXT.WITH PERSISTANT VISUALIZATION OF HYPOECHOIC THROMBUS WITHIN THE FEMORAL, PROFUNDA FEMORAL AND POPLITEAL VEINS. WHEN COMPARED TO PREVIOUS, CLOT IS NO LONGER IDENTIFIED WITH IN THE RIGHT CFV.  . Prostate biopsy      FAMILY HISTORY: family history includes Alzheimer's disease in his mother; Breast cancer in his sister; Esophageal cancer in his brother; Heart attack in his brother, brother, father, and sister; Skin cancer in his father; Stomach cancer in his father; Throat cancer in his brother and brother.  SOCIAL HISTORY:  reports that he quit smoking about 19 years ago. His smoking use included Cigarettes. He has a 75 pack-year smoking history. He has never used smokeless tobacco. He reports that he does not drink alcohol or use illicit drugs.  ALLERGIES: Levofloxacin; Carbamazepine; Celecoxib; Cephalexin; Enbrel; Humira; and Sulfa antibiotics  MEDICATIONS:  Current Outpatient Prescriptions  Medication Sig Dispense Refill  . Abatacept (ORENCIA IV) Inject into the vein once a week.    [name] albuterol (PROAIR HFA) 108 (90 BASE) MCG/ACT inhaler Inhale 2 puffs into the lungs every 6 (six) hours as needed. For shortness of breath    . allopurinol (ZYLOPRIM) 100 MG tablet Take 150 mg by mouth daily.     . colchicine 0.6 MG  tablet Take 0.6 mg by mouth daily.    [name] dicyclomine (BENTYL) 10 MG capsule Take 40 mg by mouth 2 (two) times daily.    [name] docusate sodium (COLACE) 100 MG capsule Take 100 mg by mouth 1 day or 1 dose.    . furosemide (LASIX) 40 MG tablet TAKE 2 TABLETS (80 MG TOTAL) BY MOUTH DAILY. 60 tablet 8  . omeprazole (PRILOSEC) 10  MG capsule Take 10 mg by mouth daily.    . phenytoin (DILANTIN) 100 MG ER capsule Take 100-200 mg by mouth 2 (two) times daily. Take one capsule in the morning and 2 capsules at bedtime    . warfarin (COUMADIN) 3 MG tablet Take 1.5-3 mg by mouth every evening.      No current facility-administered medications for this encounter.   REVIEW OF SYSTEMS:  A 15 point review of systems is documented in the electronic medical record. This was obtained by the nursing staff. However, I reviewed this with the patient to discuss relevant findings and make appropriate changes.  A comprehensive review of systems was negative..  The patient completed an IPSS and IIEF questionnaire.  His IPSS score was 5 indicating mild urinary outflow obstructive symptoms.  He indicated that his erectile function is unable to complete sexual activity.   PHYSICAL EXAM: This patient is in no acute distress.  He is alert and oriented.   height is 5\' 9"  (1.753 m) and weight is 219 lb 14.4 oz (99.746 kg). His blood pressure is 146/84 and his pulse is 72. His respiration is 16 and oxygen saturation is 100%.  He exhibits no respiratory distress or labored breathing.  He appears neurologically intact.  His mood is pleasant.  His affect is appropriate.  Please note the digital rectal exam findings described above.  KPS = 100   LABORATORY DATA:  Lab Results  Component Value Date   WBC 12.7* 04/13/2014   HGB 15.7 04/13/2014   HCT 45.9 04/13/2014   MCV 88.6 04/13/2014   PLT 232 04/13/2014   Lab Results  Component Value Date   NA 140 04/13/2014   K 3.9 04/13/2014   CL 98 04/13/2014   CO2 26 04/13/2014   Lab Results  Component Value Date   ALT 29 05/05/2010   AST 31 05/05/2010   ALKPHOS 87 05/05/2010   BILITOT 0.6 05/05/2010    RADIOGRAPHY: No results found.    IMPRESSION: This gentleman is a 64 y.o. gentleman with stage T1c adenocarcinoma of the prostate with a Gleason's score of 3+4 and a PSA of 6.0.  His T-Stage,  Gleason's Score, and PSA put him into the intermediate risk group.  Accordingly he is eligible for a variety of potential treatment options including prostatectomy or external radiotherapy with or without seed boost.  PLAN:Today I reviewed the findings and workup thus far.  We discussed the natural history of prostate cancer.  We reviewed the the implications of T-stage, Gleason's Score, and PSA on decision-making and outcomes related to prostate cancer.  We discussed radiation treatment in the management of prostate cancer with regard to the logistics and delivery of external beam radiation treatment as well as the logistics and delivery of prostate brachytherapy.  We compared and contrasted each of these approaches and also compared these against prostatectomy.    The patient had questions and interest in robotic-assisted laparoscopic radical prostatectomy.  We discussed some of the potential advantages of surgery including surgical staging, the availability of salvage radiotherapy to the prostatic fossa,  and the confidence associated with immediate biochemical response.  We discussed some of the potential proven indications for postoperative radiotherapy including positive margins, extracapsular extension, and seminal vesicle involvement. We also talked about some of the other potential findings leading to a recommendation for radiotherapy including a non-zero postoperative PSA and positive lymph nodes.   The patient is leaning toward prostatectomy and plans to follow-up with Dr. Alyson Ingles. I enjoyed meeting with him today, and will look forward to participating in the care of this very nice gentleman.  I will look forward to following his progress   I spent 60 minutes minutes face to face with the patient and more than 50% of that time was spent in counseling and/or coordination of care.     ------------------------------------------------  Sheral Apley. Tammi Klippel, M.D.

## 2015-07-05 ENCOUNTER — Encounter: Payer: Self-pay | Admitting: Radiation Oncology

## 2015-07-05 ENCOUNTER — Ambulatory Visit
Admission: RE | Admit: 2015-07-05 | Discharge: 2015-07-05 | Disposition: A | Discharge status: Home / Self Care | Payer: BLUE CROSS/BLUE SHIELD | Source: Ambulatory Visit | Attending: Radiation Oncology | Admitting: Radiation Oncology

## 2015-07-05 VITALS — BP 146/84 | HR 72 | Resp 16 | Ht 69.0 in | Wt 219.9 lb

## 2015-07-05 DIAGNOSIS — Z510 Encounter for antineoplastic radiation therapy: Secondary | ICD-10-CM | POA: Diagnosis present

## 2015-07-05 DIAGNOSIS — C61 Malignant neoplasm of prostate: Secondary | ICD-10-CM | POA: Diagnosis present

## 2015-07-05 NOTE — Progress Notes (Signed)
See progress note under physician encounter. 

## 2015-07-05 NOTE — Progress Notes (Signed)
Vitals stable. Reports joint pain related to RA. Reports he hasn't had a seizure in 20 years but, when he did they were grand mal and while sleeping. Reports nocturia x 4 but, relates this frequency to his diuretic. Reports occasional he has to strain to void. Denies dysuria or hematuria. Reports loose stools but, no diarrhea. Reports on average he battle constipation. Denies pain with bowel movement or blood in stool. Denies night sweats. Denies unintentional weight loss. Reports dizziness rarely.

## 2015-07-14 ENCOUNTER — Other Ambulatory Visit: Payer: Self-pay | Admitting: Urology

## 2015-08-18 ENCOUNTER — Encounter (HOSPITAL_COMMUNITY): Payer: Self-pay

## 2015-08-18 ENCOUNTER — Encounter (HOSPITAL_COMMUNITY)
Admission: RE | Admit: 2015-08-18 | Discharge: 2015-08-18 | Disposition: A | Discharge status: Home / Self Care | Payer: BLUE CROSS/BLUE SHIELD | Source: Ambulatory Visit | Attending: Urology | Admitting: Urology

## 2015-08-18 DIAGNOSIS — R0600 Dyspnea, unspecified: Secondary | ICD-10-CM | POA: Diagnosis not present

## 2015-08-18 DIAGNOSIS — Z0183 Encounter for blood typing: Secondary | ICD-10-CM | POA: Diagnosis not present

## 2015-08-18 DIAGNOSIS — C61 Malignant neoplasm of prostate: Secondary | ICD-10-CM | POA: Diagnosis not present

## 2015-08-18 DIAGNOSIS — Z01812 Encounter for preprocedural laboratory examination: Secondary | ICD-10-CM | POA: Insufficient documentation

## 2015-08-18 HISTORY — DX: Reserved for inherently not codable concepts without codable children: IMO0001

## 2015-08-18 HISTORY — DX: Congenital malformation of eye, unspecified: Q15.9

## 2015-08-18 HISTORY — DX: Personal history of other infectious and parasitic diseases: Z86.19

## 2015-08-18 HISTORY — DX: Chronic obstructive pulmonary disease, unspecified: J44.9

## 2015-08-18 HISTORY — DX: Pneumonia, unspecified organism: J18.9

## 2015-08-18 LAB — COMPREHENSIVE METABOLIC PANEL
ALT: 20 U/L (ref 17–63)
AST: 26 U/L (ref 15–41)
Albumin: 3.8 g/dL (ref 3.5–5.0)
Alkaline Phosphatase: 102 U/L (ref 38–126)
Anion gap: 8 (ref 5–15)
BUN: 14 mg/dL (ref 6–20)
CO2: 28 mmol/L (ref 22–32)
Calcium: 9.1 mg/dL (ref 8.9–10.3)
Chloride: 104 mmol/L (ref 101–111)
Creatinine, Ser: 0.87 mg/dL (ref 0.61–1.24)
GFR calc Af Amer: >60 mL/min (ref >60)
GFR calc non Af Amer: >60 mL/min (ref >60)
Glucose, Bld: 104 mg/dL — ABNORMAL HIGH (ref 65–99)
Potassium: 4 mmol/L (ref 3.5–5.1)
Sodium: 140 mmol/L (ref 135–145)
Total Bilirubin: 0.5 mg/dL (ref 0.3–1.2)
Total Protein: 7.2 g/dL (ref 6.5–8.1)

## 2015-08-18 LAB — PROTIME-INR
INR: 2.58 — ABNORMAL HIGH (ref 0.00–1.49)
Prothrombin Time: 27.3 seconds — ABNORMAL HIGH (ref 11.6–15.2)

## 2015-08-18 LAB — CBC
HCT: 46.4 % (ref 39.0–52.0)
Hemoglobin: 15.7 g/dL (ref 13.0–17.0)
MCH: 30.8 pg (ref 26.0–34.0)
MCHC: 33.8 g/dL (ref 30.0–36.0)
MCV: 91 fL (ref 78.0–100.0)
Platelets: 214 10*3/uL (ref 150–400)
RBC: 5.1 MIL/uL (ref 4.22–5.81)
RDW: 13.4 % (ref 11.5–15.5)
WBC: 8.3 10*3/uL (ref 4.0–10.5)

## 2015-08-18 LAB — ABO/RH: ABO/RH(D): B POS

## 2015-08-18 LAB — TYPE AND SCREEN
ABO/RH(D): B POS
Antibody Screen: NEGATIVE

## 2015-08-18 LAB — APTT: aPTT: 44 seconds — ABNORMAL HIGH (ref 24–37)

## 2015-08-18 NOTE — Patient Instructions (Addendum)
[name]  08/18/2015   Your procedure is scheduled on: Wednesday August 30, 2015   Report to [facility]  Entrance take [place]  elevators to 3rd floor to  [place] at 10:15 AM.  Call this number if you have problems the morning of surgery [phone]   Remember: ONLY 1 PERSON MAY GO WITH YOU TO SHORT STAY TO GET  READY MORNING OF [place].  Do not eat food After Midnight but may take clear liquids till 6:15 am day of surgery then nothing by mouth.      Take these medicines the morning of surgery with A SIP OF WATER:  Phenytoin (Dilantin); Can use Qvar Inhaler (Bring with you day of surgery)                               You may not have any metal on your body including hair pins and              piercings  Do not wear jewelry, lotions, powders or colognes, deodorant                           Men may shave face and neck.   Do not bring valuables to the hospital. [place].  Contacts, dentures or bridgework may not be worn into surgery.  Leave suitcase in the car. After surgery it may be brought to your room.    _____________________________________________________________________             [facility] - Preparing for Surgery Before surgery, you can play an important role.  Because skin is not sterile, your skin needs to be as free of germs as possible.  You can reduce the number of germs on your skin by washing with CHG (chlorahexidine gluconate) soap before surgery.  CHG is an antiseptic cleaner which kills germs and bonds with the skin to continue killing germs even after washing. Please DO NOT use if you have an allergy to CHG or antibacterial soaps.  If your skin becomes reddened/irritated stop using the CHG and inform your nurse when you arrive at Short Stay. Do not shave (including legs and underarms) for at least 48 hours prior to the first CHG shower.  You may shave your  face/neck. Please follow these instructions carefully:  1.  Shower with CHG Soap the night before surgery and the  morning of Surgery.  2.  If you choose to wash your hair, wash your hair first as usual with your  normal  shampoo.  3.  After you shampoo, rinse your hair and body thoroughly to remove the  shampoo.                           4.  Use CHG as you would any other liquid soap.  You can apply chg directly  to the skin and wash                       Gently with a scrungie or clean washcloth.  5.  Apply the CHG Soap to your body ONLY FROM THE NECK DOWN.   Do not use  on face/ open                           Wound or open sores. Avoid contact with eyes, ears mouth and genitals (private parts).                       Wash face,  Genitals (private parts) with your normal soap.             6.  Wash thoroughly, paying special attention to the area where your surgery  will be performed.  7.  Thoroughly rinse your body with warm water from the neck down.  8.  DO NOT shower/wash with your normal soap after using and rinsing off  the CHG Soap.                9.  Pat yourself dry with a clean towel.            10.  Wear clean pajamas.            11.  Place clean sheets on your bed the night of your first shower and do not  sleep with pets. Day of Surgery : Do not apply any lotions/deodorants the morning of surgery.  Please wear clean clothes to the hospital/surgery center.  FAILURE TO FOLLOW THESE INSTRUCTIONS MAY RESULT IN THE CANCELLATION OF YOUR SURGERY PATIENT SIGNATURE_________________________________  NURSE SIGNATURE__________________________________  ________________________________________________________________________    CLEAR LIQUID DIET   Foods Allowed                                                                     Foods Excluded  Coffee and tea, regular and decaf                             liquids that you cannot  Plain Jell-O in any flavor                                              see through such as: Fruit ices (not with fruit pulp)                                     milk, soups, orange juice  Iced Popsicles                                    All solid food Carbonated beverages, regular and diet                                    Cranberry, grape and apple juices Sports drinks like Gatorade Lightly seasoned clear broth or consume(fat free) Sugar, honey syrup  Sample Menu Breakfast  Lunch                                     Supper Cranberry juice                    Beef broth                            Chicken broth Jell-O                                     Grape juice                           Apple juice Coffee or tea                        Jell-O                                      Popsicle                                                Coffee or tea                        Coffee or tea  _____________________________________________________________________

## 2015-08-18 NOTE — Progress Notes (Signed)
CXR / epic 03/08/2015 Stress Test / epic 07/20/2014 ECHO/epic 01/21/12 LOV note per Dr Croitoru/cardiology / epic 09/12/2014

## 2015-08-30 ENCOUNTER — Inpatient Hospital Stay (HOSPITAL_COMMUNITY): Payer: BLUE CROSS/BLUE SHIELD | Admitting: Certified Registered Nurse Anesthetist

## 2015-08-30 ENCOUNTER — Encounter (HOSPITAL_COMMUNITY): Payer: Self-pay | Admitting: Certified Registered Nurse Anesthetist

## 2015-08-30 ENCOUNTER — Inpatient Hospital Stay (HOSPITAL_COMMUNITY)
Admission: RE | Admit: 2015-08-30 | Discharge: 2015-09-01 | DRG: 708 | Disposition: A | Discharge status: Home / Self Care | Payer: BLUE CROSS/BLUE SHIELD | Source: Ambulatory Visit | Attending: Urology | Admitting: Urology

## 2015-08-30 ENCOUNTER — Encounter (HOSPITAL_COMMUNITY)
Admission: RE | Disposition: A | Discharge status: Home / Self Care | Payer: Self-pay | Source: Ambulatory Visit | Attending: Urology

## 2015-08-30 DIAGNOSIS — I739 Peripheral vascular disease, unspecified: Secondary | ICD-10-CM | POA: Diagnosis present

## 2015-08-30 DIAGNOSIS — K219 Gastro-esophageal reflux disease without esophagitis: Secondary | ICD-10-CM | POA: Diagnosis present

## 2015-08-30 DIAGNOSIS — F419 Anxiety disorder, unspecified: Secondary | ICD-10-CM | POA: Diagnosis present

## 2015-08-30 DIAGNOSIS — Z87891 Personal history of nicotine dependence: Secondary | ICD-10-CM | POA: Diagnosis not present

## 2015-08-30 DIAGNOSIS — K589 Irritable bowel syndrome without diarrhea: Secondary | ICD-10-CM | POA: Diagnosis present

## 2015-08-30 DIAGNOSIS — G4730 Sleep apnea, unspecified: Secondary | ICD-10-CM | POA: Diagnosis present

## 2015-08-30 DIAGNOSIS — Z803 Family history of malignant neoplasm of breast: Secondary | ICD-10-CM

## 2015-08-30 DIAGNOSIS — E780 Pure hypercholesterolemia: Secondary | ICD-10-CM | POA: Diagnosis present

## 2015-08-30 DIAGNOSIS — M1990 Unspecified osteoarthritis, unspecified site: Secondary | ICD-10-CM | POA: Diagnosis present

## 2015-08-30 DIAGNOSIS — J45909 Unspecified asthma, uncomplicated: Secondary | ICD-10-CM | POA: Diagnosis present

## 2015-08-30 DIAGNOSIS — C61 Malignant neoplasm of prostate: Secondary | ICD-10-CM | POA: Diagnosis present

## 2015-08-30 DIAGNOSIS — M109 Gout, unspecified: Secondary | ICD-10-CM | POA: Diagnosis present

## 2015-08-30 DIAGNOSIS — M5030 Other cervical disc degeneration, unspecified cervical region: Secondary | ICD-10-CM | POA: Diagnosis present

## 2015-08-30 DIAGNOSIS — Z79899 Other long term (current) drug therapy: Secondary | ICD-10-CM | POA: Diagnosis not present

## 2015-08-30 DIAGNOSIS — Z800 Family history of malignant neoplasm of digestive organs: Secondary | ICD-10-CM

## 2015-08-30 DIAGNOSIS — J449 Chronic obstructive pulmonary disease, unspecified: Secondary | ICD-10-CM | POA: Diagnosis present

## 2015-08-30 DIAGNOSIS — Z8619 Personal history of other infectious and parasitic diseases: Secondary | ICD-10-CM | POA: Diagnosis not present

## 2015-08-30 HISTORY — PX: ROBOT ASSISTED LAPAROSCOPIC RADICAL PROSTATECTOMY: SHX5141

## 2015-08-30 HISTORY — PX: LYMPHADENECTOMY: SHX5960

## 2015-08-30 LAB — CBC
HCT: 42.4 % (ref 39.0–52.0)
Hemoglobin: 14.2 g/dL (ref 13.0–17.0)
MCH: 30.9 pg (ref 26.0–34.0)
MCHC: 33.5 g/dL (ref 30.0–36.0)
MCV: 92.2 fL (ref 78.0–100.0)
Platelets: 170 10*3/uL (ref 150–400)
RBC: 4.6 MIL/uL (ref 4.22–5.81)
RDW: 13.2 % (ref 11.5–15.5)
WBC: 14 10*3/uL — ABNORMAL HIGH (ref 4.0–10.5)

## 2015-08-30 LAB — BASIC METABOLIC PANEL
Anion gap: 8 (ref 5–15)
BUN: 15 mg/dL (ref 6–20)
CO2: 29 mmol/L (ref 22–32)
Calcium: 8.3 mg/dL — ABNORMAL LOW (ref 8.9–10.3)
Chloride: 103 mmol/L (ref 101–111)
Creatinine, Ser: 1.11 mg/dL (ref 0.61–1.24)
GFR calc Af Amer: >60 mL/min (ref >60)
GFR calc non Af Amer: >60 mL/min (ref >60)
Glucose, Bld: 124 mg/dL — ABNORMAL HIGH (ref 65–99)
Potassium: 3.6 mmol/L (ref 3.5–5.1)
Sodium: 140 mmol/L (ref 135–145)

## 2015-08-30 LAB — PROTIME-INR
INR: 1.27 (ref 0.00–1.49)
Prothrombin Time: 16 seconds — ABNORMAL HIGH (ref 11.6–15.2)

## 2015-08-30 SURGERY — ROBOTIC ASSISTED LAPAROSCOPIC RADICAL PROSTATECTOMY
Anesthesia: General

## 2015-08-30 MED ORDER — STERILE WATER FOR IRRIGATION IR SOLN
Status: DC | PRN
  Administered 2015-08-30: 1000 mL

## 2015-08-30 MED ORDER — LACTATED RINGERS IV SOLN
INTRAVENOUS | Status: DC
  Administered 2015-08-30: 1000 mL via INTRAVENOUS

## 2015-08-30 MED ORDER — MIDAZOLAM HCL 5 MG/5ML IJ SOLN
INTRAMUSCULAR | Status: DC | PRN
  Administered 2015-08-30 (×2): 1 mg via INTRAVENOUS

## 2015-08-30 MED ORDER — DICYCLOMINE HCL 10 MG PO CAPS
40.0000 mg | ORAL_CAPSULE | Freq: Two times a day (BID) | ORAL | Status: DC
  Administered 2015-08-30 – 2015-09-01 (×4): 40 mg via ORAL
  Filled 2015-08-30 (×4): qty 4

## 2015-08-30 MED ORDER — PROMETHAZINE HCL 25 MG/ML IJ SOLN: 6.2500 mg | INTRAMUSCULAR | Status: DC | PRN

## 2015-08-30 MED ORDER — MIDAZOLAM HCL 2 MG/2ML IJ SOLN
INTRAMUSCULAR | Status: AC
  Filled 2015-08-30: qty 4

## 2015-08-30 MED ORDER — PROPOFOL 10 MG/ML IV BOLUS
INTRAVENOUS | Status: DC | PRN
  Administered 2015-08-30: 175 mg via INTRAVENOUS

## 2015-08-30 MED ORDER — DIPHENHYDRAMINE HCL 12.5 MG/5ML PO ELIX: 12.5000 mg | ORAL_SOLUTION | Freq: Four times a day (QID) | ORAL | Status: DC | PRN

## 2015-08-30 MED ORDER — LACTATED RINGERS IV SOLN
INTRAVENOUS | Status: DC | PRN
  Administered 2015-08-30: 11:00:00 via INTRAVENOUS

## 2015-08-30 MED ORDER — GENTAMICIN SULFATE 40 MG/ML IJ SOLN
400.0000 mg | INTRAMUSCULAR | Status: AC
  Administered 2015-08-30: 400 mg via INTRAVENOUS
  Filled 2015-08-30 (×2): qty 10

## 2015-08-30 MED ORDER — ONDANSETRON HCL 4 MG/2ML IJ SOLN
4.0000 mg | INTRAMUSCULAR | Status: DC | PRN
  Administered 2015-08-31: 4 mg via INTRAVENOUS
  Filled 2015-08-30: qty 2

## 2015-08-30 MED ORDER — FENTANYL CITRATE (PF) 100 MCG/2ML IJ SOLN
INTRAMUSCULAR | Status: DC | PRN
  Administered 2015-08-30: 100 ug via INTRAVENOUS
  Administered 2015-08-30 (×5): 50 ug via INTRAVENOUS

## 2015-08-30 MED ORDER — BUDESONIDE 0.25 MG/2ML IN SUSP
0.2500 mg | Freq: Two times a day (BID) | RESPIRATORY_TRACT | Status: DC
  Administered 2015-08-30 – 2015-09-01 (×4): 0.25 mg via RESPIRATORY_TRACT
  Filled 2015-08-30 (×4): qty 2

## 2015-08-30 MED ORDER — PROPOFOL 10 MG/ML IV BOLUS
INTRAVENOUS | Status: AC
  Filled 2015-08-30: qty 20

## 2015-08-30 MED ORDER — BUPIVACAINE HCL (PF) 0.25 % IJ SOLN
INTRAMUSCULAR | Status: AC
  Filled 2015-08-30: qty 30

## 2015-08-30 MED ORDER — FENTANYL CITRATE (PF) 250 MCG/5ML IJ SOLN
INTRAMUSCULAR | Status: AC
  Filled 2015-08-30: qty 25

## 2015-08-30 MED ORDER — CEFAZOLIN SODIUM-DEXTROSE 2-3 GM-% IV SOLR
INTRAVENOUS | Status: AC
  Filled 2015-08-30: qty 50

## 2015-08-30 MED ORDER — ONDANSETRON HCL 4 MG/2ML IJ SOLN
INTRAMUSCULAR | Status: AC
  Filled 2015-08-30: qty 2

## 2015-08-30 MED ORDER — FUROSEMIDE 40 MG PO TABS
80.0000 mg | ORAL_TABLET | Freq: Every day | ORAL | Status: DC
  Administered 2015-08-30 – 2015-09-01 (×3): 80 mg via ORAL
  Filled 2015-08-30 (×3): qty 2

## 2015-08-30 MED ORDER — PANTOPRAZOLE SODIUM 40 MG PO TBEC
40.0000 mg | DELAYED_RELEASE_TABLET | Freq: Every day | ORAL | Status: DC
  Administered 2015-08-30 – 2015-09-01 (×3): 40 mg via ORAL
  Filled 2015-08-30 (×3): qty 1

## 2015-08-30 MED ORDER — MAGNESIUM CITRATE PO SOLN: 1.0000 | Freq: Once | ORAL | Status: DC | PRN

## 2015-08-30 MED ORDER — LIDOCAINE HCL (CARDIAC) 20 MG/ML IV SOLN
INTRAVENOUS | Status: AC
  Filled 2015-08-30: qty 5

## 2015-08-30 MED ORDER — SODIUM CHLORIDE 0.9 % IR SOLN
Status: DC | PRN
  Administered 2015-08-30: 1000 mL via INTRAVESICAL

## 2015-08-30 MED ORDER — ENOXAPARIN SODIUM 30 MG/0.3ML ~~LOC~~ SOLN
30.0000 mg | Freq: Two times a day (BID) | SUBCUTANEOUS | Status: DC
  Administered 2015-08-31 – 2015-09-01 (×3): 30 mg via SUBCUTANEOUS
  Filled 2015-08-30 (×4): qty 0.3

## 2015-08-30 MED ORDER — BELLADONNA ALKALOIDS-OPIUM 16.2-60 MG RE SUPP: 1.0000 | Freq: Four times a day (QID) | RECTAL | Status: DC | PRN

## 2015-08-30 MED ORDER — SODIUM CHLORIDE 0.9 % IV BOLUS (SEPSIS)
1000.0000 mL | Freq: Once | INTRAVENOUS | Status: AC
  Administered 2015-08-30: 1000 mL via INTRAVENOUS

## 2015-08-30 MED ORDER — NEOSTIGMINE METHYLSULFATE 10 MG/10ML IV SOLN
INTRAVENOUS | Status: DC | PRN
Start: 2015-08-30 — End: 2015-08-30
  Administered 2015-08-30: 4 mg via INTRAVENOUS

## 2015-08-30 MED ORDER — HYDROCODONE-ACETAMINOPHEN 5-325 MG PO TABS: 1.0000 | ORAL_TABLET | Freq: Four times a day (QID) | ORAL | Status: DC | PRN

## 2015-08-30 MED ORDER — KETOROLAC TROMETHAMINE 15 MG/ML IJ SOLN
15.0000 mg | Freq: Four times a day (QID) | INTRAMUSCULAR | Status: AC
  Administered 2015-08-30 – 2015-08-31 (×6): 15 mg via INTRAVENOUS
  Filled 2015-08-30 (×6): qty 1

## 2015-08-30 MED ORDER — FENTANYL CITRATE (PF) 100 MCG/2ML IJ SOLN
INTRAMUSCULAR | Status: AC
  Filled 2015-08-30: qty 4

## 2015-08-30 MED ORDER — ROCURONIUM BROMIDE 100 MG/10ML IV SOLN
INTRAVENOUS | Status: DC | PRN
  Administered 2015-08-30 (×2): 10 mg via INTRAVENOUS
  Administered 2015-08-30: 20 mg via INTRAVENOUS
  Administered 2015-08-30: 10 mg via INTRAVENOUS
  Administered 2015-08-30: 20 mg via INTRAVENOUS
  Administered 2015-08-30: 10 mg via INTRAVENOUS
  Administered 2015-08-30: 50 mg via INTRAVENOUS
  Administered 2015-08-30: 10 mg via INTRAVENOUS
  Administered 2015-08-30: 20 mg via INTRAVENOUS

## 2015-08-30 MED ORDER — ROCURONIUM BROMIDE 100 MG/10ML IV SOLN
INTRAVENOUS | Status: AC
  Filled 2015-08-30: qty 1

## 2015-08-30 MED ORDER — GLYCOPYRROLATE 0.2 MG/ML IJ SOLN
INTRAMUSCULAR | Status: DC | PRN
  Administered 2015-08-30: 0.6 mg via INTRAVENOUS

## 2015-08-30 MED ORDER — PHENYTOIN SODIUM EXTENDED 100 MG PO CAPS
100.0000 mg | ORAL_CAPSULE | Freq: Every morning | ORAL | Status: DC
  Administered 2015-08-31 – 2015-09-01 (×2): 100 mg via ORAL
  Filled 2015-08-30 (×2): qty 1

## 2015-08-30 MED ORDER — INFLUENZA VAC SPLIT QUAD 0.5 ML IM SUSY
0.5000 mL | PREFILLED_SYRINGE | INTRAMUSCULAR | Status: AC
  Administered 2015-08-31: 0.5 mL via INTRAMUSCULAR
  Filled 2015-08-30 (×2): qty 0.5

## 2015-08-30 MED ORDER — CEFAZOLIN SODIUM 1-5 GM-% IV SOLN: 1.0000 g | INTRAVENOUS | Status: DC

## 2015-08-30 MED ORDER — ALLOPURINOL 100 MG PO TABS
200.0000 mg | ORAL_TABLET | Freq: Every day | ORAL | Status: DC
  Administered 2015-08-30 – 2015-08-31 (×2): 200 mg via ORAL
  Filled 2015-08-30 (×2): qty 2

## 2015-08-30 MED ORDER — GLYCOPYRROLATE 0.2 MG/ML IJ SOLN
INTRAMUSCULAR | Status: AC
  Filled 2015-08-30: qty 3

## 2015-08-30 MED ORDER — ACETAMINOPHEN 325 MG PO TABS: 650.0000 mg | ORAL_TABLET | ORAL | Status: DC | PRN

## 2015-08-30 MED ORDER — COLCHICINE 0.6 MG PO TABS
0.6000 mg | ORAL_TABLET | Freq: Every day | ORAL | Status: DC
  Administered 2015-08-30 – 2015-08-31 (×2): 0.6 mg via ORAL
  Filled 2015-08-30 (×2): qty 1

## 2015-08-30 MED ORDER — SUCCINYLCHOLINE CHLORIDE 20 MG/ML IJ SOLN
INTRAMUSCULAR | Status: DC | PRN
  Administered 2015-08-30: 100 mg via INTRAVENOUS

## 2015-08-30 MED ORDER — LIDOCAINE HCL (CARDIAC) 20 MG/ML IV SOLN
INTRAVENOUS | Status: DC | PRN
  Administered 2015-08-30: 75 mg via INTRAVENOUS

## 2015-08-30 MED ORDER — ONDANSETRON HCL 4 MG/2ML IJ SOLN
INTRAMUSCULAR | Status: DC | PRN
  Administered 2015-08-30: 4 mg via INTRAVENOUS

## 2015-08-30 MED ORDER — ZOLPIDEM TARTRATE 5 MG PO TABS: 5.0000 mg | ORAL_TABLET | Freq: Every evening | ORAL | Status: DC | PRN

## 2015-08-30 MED ORDER — HYDROMORPHONE HCL 1 MG/ML IJ SOLN
0.2500 mg | INTRAMUSCULAR | Status: DC | PRN
  Administered 2015-08-30 (×2): 0.25 mg via INTRAVENOUS
  Administered 2015-08-30: 0.5 mg via INTRAVENOUS
  Administered 2015-08-30: 0.25 mg via INTRAVENOUS
  Administered 2015-08-30: 0.5 mg via INTRAVENOUS
  Administered 2015-08-30: 0.25 mg via INTRAVENOUS

## 2015-08-30 MED ORDER — BUPIVACAINE HCL (PF) 0.25 % IJ SOLN
INTRAMUSCULAR | Status: DC | PRN
  Administered 2015-08-30: 15 mL

## 2015-08-30 MED ORDER — SODIUM CHLORIDE 0.9 % IV SOLN
INTRAVENOUS | Status: DC
Start: 2015-08-30 — End: 2015-09-01
  Administered 2015-08-30 – 2015-09-01 (×5): via INTRAVENOUS

## 2015-08-30 MED ORDER — PHENYTOIN SODIUM EXTENDED 100 MG PO CAPS
200.0000 mg | ORAL_CAPSULE | Freq: Every day | ORAL | Status: DC
  Administered 2015-08-30 – 2015-08-31 (×2): 200 mg via ORAL
  Filled 2015-08-30 (×4): qty 2

## 2015-08-30 MED ORDER — LACTATED RINGERS IV SOLN
INTRAVENOUS | Status: DC | PRN
  Administered 2015-08-30 (×2): via INTRAVENOUS

## 2015-08-30 MED ORDER — BISACODYL 10 MG RE SUPP
10.0000 mg | Freq: Every day | RECTAL | Status: DC | PRN
  Administered 2015-08-31: 10 mg via RECTAL
  Filled 2015-08-30: qty 1

## 2015-08-30 MED ORDER — OXYCODONE-ACETAMINOPHEN 5-325 MG PO TABS: 1.0000 | ORAL_TABLET | ORAL | Status: DC | PRN

## 2015-08-30 MED ORDER — POLYETHYLENE GLYCOL 3350 17 G PO PACK: 17.0000 g | PACK | Freq: Every day | ORAL | Status: DC | PRN

## 2015-08-30 MED ORDER — SENNOSIDES-DOCUSATE SODIUM 8.6-50 MG PO TABS
2.0000 | ORAL_TABLET | Freq: Every day | ORAL | Status: DC
  Administered 2015-08-30 – 2015-08-31 (×2): 2 via ORAL
  Filled 2015-08-30 (×2): qty 2

## 2015-08-30 MED ORDER — DOCUSATE SODIUM 100 MG PO CAPS
100.0000 mg | ORAL_CAPSULE | Freq: Every day | ORAL | Status: DC
  Administered 2015-08-30 – 2015-08-31 (×2): 100 mg via ORAL
  Filled 2015-08-30 (×2): qty 1

## 2015-08-30 MED ORDER — LACTATED RINGERS IR SOLN
Status: DC | PRN
  Administered 2015-08-30: 1000 mL

## 2015-08-30 MED ORDER — HYDROMORPHONE HCL 1 MG/ML IJ SOLN
INTRAMUSCULAR | Status: AC
  Filled 2015-08-30: qty 1

## 2015-08-30 MED ORDER — NEOSTIGMINE METHYLSULFATE 10 MG/10ML IV SOLN
INTRAVENOUS | Status: AC
  Filled 2015-08-30: qty 1

## 2015-08-30 MED ORDER — CIPROFLOXACIN HCL 500 MG PO TABS: 500.0000 mg | ORAL_TABLET | Freq: Two times a day (BID) | ORAL | Status: DC

## 2015-08-30 MED ORDER — DIPHENHYDRAMINE HCL 50 MG/ML IJ SOLN: 12.5000 mg | Freq: Four times a day (QID) | INTRAMUSCULAR | Status: DC | PRN

## 2015-08-30 MED ORDER — HYDROMORPHONE HCL 1 MG/ML IJ SOLN: 0.5000 mg | INTRAMUSCULAR | Status: DC | PRN

## 2015-08-30 MED ORDER — PHENYLEPHRINE HCL 10 MG/ML IJ SOLN
INTRAMUSCULAR | Status: DC | PRN
  Administered 2015-08-30: 40 ug via INTRAVENOUS

## 2015-08-30 SURGICAL SUPPLY — 59 items
CABLE HIGH FREQUENCY MONO STRZ (ELECTRODE) ×3 IMPLANT
CATH FOLEY 2WAY SLVR 18FR 30CC (CATHETERS) ×3 IMPLANT
CATH TIEMANN FOLEY 18FR 5CC (CATHETERS) ×3 IMPLANT
CHLORAPREP W/TINT 26ML (MISCELLANEOUS) ×3 IMPLANT
CLIP LIGATING HEM O LOK PURPLE (MISCELLANEOUS) ×8 IMPLANT
CLOTH BEACON ORANGE TIMEOUT ST (SAFETY) ×3 IMPLANT
CONT SPEC 4OZ CLIKSEAL STRL BL (MISCELLANEOUS) ×3 IMPLANT
CORDS BIPOLAR (ELECTRODE) ×1 IMPLANT
COVER SURGICAL LIGHT HANDLE (MISCELLANEOUS) ×3 IMPLANT
COVER TIP SHEARS 8 DVNC (MISCELLANEOUS) ×2 IMPLANT
COVER TIP SHEARS 8MM DA VINCI (MISCELLANEOUS) ×1
CUTTER ECHEON FLEX ENDO 45 340 (ENDOMECHANICALS) ×3 IMPLANT
DECANTER SPIKE VIAL GLASS SM (MISCELLANEOUS) ×2 IMPLANT
DRAPE SHEET LG 3/4 BI-LAMINATE (DRAPES) ×1 IMPLANT
DRSG TEGADERM 4X4.75 (GAUZE/BANDAGES/DRESSINGS) ×5 IMPLANT
DRSG TEGADERM 6X8 (GAUZE/BANDAGES/DRESSINGS) ×5 IMPLANT
ELECT REM PT RETURN 9FT ADLT (ELECTROSURGICAL) ×3
ELECTRODE REM PT RTRN 9FT ADLT (ELECTROSURGICAL) ×2 IMPLANT
GAUZE SPONGE 2X2 8PLY STRL LF (GAUZE/BANDAGES/DRESSINGS) ×2 IMPLANT
GLOVE BIO SURGEON STRL SZ 6.5 (GLOVE) ×3 IMPLANT
GLOVE BIO SURGEON STRL SZ7 (GLOVE) ×1 IMPLANT
GLOVE BIOGEL M STRL SZ7.5 (GLOVE) ×7 IMPLANT
GLOVE BIOGEL PI IND STRL 7.5 (GLOVE) ×2 IMPLANT
GLOVE BIOGEL PI INDICATOR 7.5 (GLOVE) ×1
GOWN STRL REUS W/TWL LRG LVL3 (GOWN DISPOSABLE) ×6 IMPLANT
GOWN STRL REUS W/TWL LRG LVL4 (GOWN DISPOSABLE) ×9 IMPLANT
HOLDER FOLEY CATH W/STRAP (MISCELLANEOUS) ×3 IMPLANT
IV LACTATED RINGERS 1000ML (IV SOLUTION) ×3 IMPLANT
KIT ACCESSORY DA VINCI DISP (KITS) ×1
KIT ACCESSORY DVNC DISP (KITS) ×2 IMPLANT
KIT PROCEDURE DA VINCI SI (MISCELLANEOUS) ×1
KIT PROCEDURE DVNC SI (MISCELLANEOUS) ×2 IMPLANT
LIQUID BAND (GAUZE/BANDAGES/DRESSINGS) ×3 IMPLANT
NDL INSUFFLATION 14GA 120MM (NEEDLE) ×2 IMPLANT
NDL SPNL 22GX7 QUINCKE BK (NEEDLE) ×2 IMPLANT
NEEDLE INSUFFLATION 14GA 120MM (NEEDLE) ×3 IMPLANT
NEEDLE SPNL 22GX7 QUINCKE BK (NEEDLE) ×3 IMPLANT
PACK ROBOT UROLOGY CUSTOM (CUSTOM PROCEDURE TRAY) ×3 IMPLANT
PAD POSITIONING PINK XL (MISCELLANEOUS) IMPLANT
PEN SKIN MARKING BROAD (MISCELLANEOUS) ×1 IMPLANT
RELOAD GREEN ECHELON 45 (STAPLE) ×4 IMPLANT
SET TUBE IRRIG SUCTION NO TIP (IRRIGATION / IRRIGATOR) ×3 IMPLANT
SHEET LAVH (DRAPES) IMPLANT
SOLUTION ELECTROLUBE (MISCELLANEOUS) ×3 IMPLANT
SPONGE GAUZE 2X2 STER 10/PKG (GAUZE/BANDAGES/DRESSINGS) ×1
SPONGE LAP 4X18 X RAY DECT (DISPOSABLE) ×4 IMPLANT
SUT ETHILON 3 0 PS 1 (SUTURE) ×3 IMPLANT
SUT MNCRL AB 4-0 PS2 18 (SUTURE) ×7 IMPLANT
SUT PDS AB 1 CT1 27 (SUTURE) ×7 IMPLANT
SUT VIC AB 2-0 SH 27 (SUTURE) ×3
SUT VIC AB 2-0 SH 27X BRD (SUTURE) ×2 IMPLANT
SUT VICRYL 0 UR6 27IN ABS (SUTURE) ×3 IMPLANT
SUT VLOC BARB 180 ABS3/0GR12 (SUTURE) ×9
SUTURE VLOC BRB 180 ABS3/0GR12 (SUTURE) ×6 IMPLANT
SYR 27GX1/2 1ML LL SAFETY (SYRINGE) ×3 IMPLANT
TOWEL OR 17X26 10 PK STRL BLUE (TOWEL DISPOSABLE) ×3 IMPLANT
TOWEL OR NON WOVEN STRL DISP B (DISPOSABLE) ×3 IMPLANT
TROCAR 12M 150ML BLUNT (TROCAR) ×4 IMPLANT
WATER STERILE IRR 1500ML POUR (IV SOLUTION) ×4 IMPLANT

## 2015-08-30 NOTE — Progress Notes (Signed)
Pharmacy Note- Gentamicin for surgical prophylaxis  Patient is s/p laparoscopic radical prostatectomy.  Received gentamicin 400mg  IV pre-op. No indication for prolonged antibiotic coverage post-op noted.  Pre-op gentamicin dose will provide 24hr coverage post-op. No further antibiotics at this time.   Pharmacy to sign off.  Please re-consult if antibiotic dosing required for anticipated or documented infection.   Netta Cedars, PharmD, BCPS Pager: [phone] 08/30/2015@6 :57 PM

## 2015-08-30 NOTE — Anesthesia Procedure Notes (Signed)
Procedure Name: Intubation Date/Time: 08/30/2015 11:54 AM Performed by: Ofilia Neas Pre-anesthesia Checklist: Patient identified, Emergency Drugs available, Suction available, Patient being monitored and Timeout performed Patient Re-evaluated:Patient Re-evaluated prior to inductionOxygen Delivery Method: Circle system utilized Preoxygenation: Pre-oxygenation with 100% oxygen Intubation Type: IV induction and Cricoid Pressure applied Ventilation: Two handed mask ventilation required Laryngoscope Size: Mac and 4 Grade View: Grade II Tube type: Oral Tube size: 8.0 mm Number of attempts: 1 Airway Equipment and Method: Stylet Placement Confirmation: ETT inserted through vocal cords under direct vision,  positive ETCO2 and breath sounds checked- equal and bilateral Secured at: 22 cm Tube secured with: Tape Dental Injury: Teeth and Oropharynx as per pre-operative assessment

## 2015-08-30 NOTE — Anesthesia Preprocedure Evaluation (Addendum)
Anesthesia Evaluation  Patient identified by MRN, date of birth, ID band Patient awake    Airway Mallampati: II  TM Distance: >3 FB Neck ROM: Full    Dental no notable dental hx. (+) Dental Advisory Given, Teeth Intact   Pulmonary shortness of breath, asthma , sleep apnea , pneumonia -, COPDformer smoker,  breath sounds clear to auscultation  Pulmonary exam normal       Cardiovascular + Peripheral Vascular Disease Normal cardiovascular examRate:Normal     Neuro/Psych    GI/Hepatic Neg liver ROS, GERD-  ,  Endo/Other  negative endocrine ROS  Renal/GU negative Renal ROS     Musculoskeletal  (+) Arthritis -,   Abdominal   Peds  Hematology   Anesthesia Other Findings   Reproductive/Obstetrics                          Anesthesia Physical Anesthesia Plan  ASA: III  Anesthesia Plan: General   Post-op Pain Management:    Induction: Intravenous  Airway Management Planned: Oral ETT  Additional Equipment:   Intra-op Plan:   Post-operative Plan:   Informed Consent: I have reviewed the patients History and Physical, chart, labs and discussed the procedure including the risks, benefits and alternatives for the proposed anesthesia with the patient or authorized representative who has indicated his/her understanding and acceptance.   Dental advisory given  Plan Discussed with: CRNA and Anesthesiologist  Anesthesia Plan Comments:         Anesthesia Quick Evaluation

## 2015-08-30 NOTE — H&P (Signed)
Urology Admission H&P  Chief Complaint: prostate cancer  History of Present Illness: [name] is a 64yo with prostate cancer here for robotic radical prostatectomy. He was found to have high volume Gleason 3+4=7 prostate cancer on workup for elevated PSA.  He denies any fevers/chills/sweats. He denies any LUTS.   Past Medical History  Diagnosis Date  . Seizures   . GERD (gastroesophageal reflux disease)   . IBS (irritable bowel syndrome)   . Arthritis   . DDD (degenerative disc disease), cervical     with UE's paresthesias  . Diverticulosis   . Peripheral edema   . Sleep apnea     not on cpap  . Prostate cancer   . Asthma   . Gout   . Hypercholesterolemia   . IBS (irritable bowel syndrome)   . COPD (chronic obstructive pulmonary disease)   . DVT, lower extremity     bilat  . Shortness of breath dyspnea     exertion   . Pneumonia     hx of   . Anxiety     hx of   . History of shingles   . History of measles   . 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 Cholecystectomy    . Doppler echocardiography N/A 01-21-2012    TECHNICALLY DIFFICULT. MILD CONCENTRIC LV HYPERTROPHY. LV CAVITY IS SMALL.. EF=> 55%. TRANSMITRAL SPECTRAL FLOW PATTREN IS SUGGESTIVE OF IMPAIRED LV RELAXATION. RV SYSTOLIC PRESSURE IS 63ZCHY. LEFT ATRIAL SIZE IS NORMAL. AV APPEARS MILDLY SCLEROTIC. NO SIGN VALVE DISEASE NOTED.  . Nuclear stress test N/A 01-21-2012    NORMAL PATTERN OF PERFUSION IN ALL REGIONS. POST STRESS LV SIZE IS NORMAL. NO EVIDENCE OF INDUCIBLE ISCHEMIA. EF 54%.  . US venous lower ext Right 03/07/11    PERSISTENT DVT IN RIGHT LOWER EXT.WITH PERSISTANT VISUALIZATION OF HYPOECHOIC THROMBUS WITHIN THE FEMORAL, PROFUNDA FEMORAL AND POPLITEAL VEINS. WHEN COMPARED TO PREVIOUS, CLOT IS NO LONGER IDENTIFIED WITH IN THE RIGHT CFV.  . Prostate biopsy      Home Medications:  Prescriptions  prior to admission  Medication Sig Dispense Refill Last Dose  . allopurinol (ZYLOPRIM) 100 MG tablet Take 200 mg by mouth at bedtime.    08/29/2015 at 2430  . beclomethasone (QVAR) 80 MCG/ACT inhaler Inhale 2 puffs into the lungs 2 (two) times daily. As needed   08/30/2015 at 0730  . colchicine 0.6 MG tablet Take 0.6 mg by mouth at bedtime.    08/29/2015 at 2430  . dicyclomine (BENTYL) 10 MG capsule Take 40 mg by mouth 2 (two) times daily.   08/29/2015 at 2430  . docusate sodium (COLACE) 100 MG capsule Take 100 mg by mouth at bedtime.    08/29/2015 at 2430  . enoxaparin (LOVENOX) 30 MG/0.3ML injection Inject 30 mg into the skin every 12 (twelve) hours. To begin 08/25/2015   08/29/2015 at 2430  . furosemide (LASIX) 40 MG tablet TAKE 2 TABLETS (80 MG TOTAL) BY MOUTH DAILY. (Patient taking differently: TAKE 2 TABLETS (80 MG TOTAL) BY MOUTH NIGHTLY.) 60 tablet 8 08/29/2015 at 2430  . omeprazole (PRILOSEC) 10 MG capsule Take 10 mg by mouth at bedtime.    08/29/2015 at 2430  . phenytoin (DILANTIN) 100 MG ER capsule Take 100-200 mg by mouth 2 (two) times daily. Take one capsule in the morning and 2 capsules at bedtime   08/30/2015  at 0730  . warfarin (COUMADIN) 3 MG tablet Take 3 mg by mouth at bedtime.    08/24/2015 at hs   Allergies:  Allergies  Allergen Reactions  . Levofloxacin Other (See Comments)    REACTION: GI Intolerance BRAND NAME IS LEVAQUIN  . Carbamazepine Rash    REACTION: makes drowsy BRAND NAME IS TEGRETOL  . Celecoxib Itching and Rash    BRAND NAME IS CELEBREX  . Cephalexin Rash    "Severe Rash".  BRAND NAME IS KEFLEX.   . Enbrel [Etanercept] Rash  . Humira [Adalimumab] Rash  . Sulfa Antibiotics Rash    Family History  Problem Relation Age of Onset  . Skin cancer Father   . Stomach cancer Father   . Heart attack Father   . Alzheimer's disease Mother   . Throat cancer Brother   . Heart attack Brother   . Esophageal cancer Brother   . Heart attack Brother   . Breast cancer  Sister   . Heart attack Sister   . Throat cancer Brother    Social History:  reports that he quit smoking about 25 years ago. His smoking use included Cigarettes. He has a 60 pack-year smoking history. He has never used smokeless tobacco. He reports that he does not drink alcohol or use illicit drugs.  ROS  Physical Exam:  Vital signs in last 24 hours: Temp:  [97.6 F (36.4 C)-98.5 F (36.9 C)] 98.5 F (36.9 C) (08/31 1821) Pulse Rate:  [84-91] 88 (08/31 1821) Resp:  [14-28] 20 (08/31 1821) BP: (135-167)/(72-107) 138/77 mmHg (08/31 1821) SpO2:  [93 %-100 %] 95 % (08/31 1821) Weight:  [99.973 kg (220 lb 6.4 oz)-101.152 kg (223 lb)] 99.973 kg (220 lb 6.4 oz) (08/31 1821) Physical Exam  Laboratory Data:  Results for orders placed or performed during the hospital encounter of 08/30/15 (from the past 24 hour(s))  Protime-INR     Status: Abnormal   Collection Time: 08/30/15 10:25 AM  Result Value Ref Range   Prothrombin Time 16.0 (H) 11.6 - 15.2 seconds   INR 1.27 0.00 - 1.49  CBC     Status: Abnormal   Collection Time: 08/30/15  5:55 PM  Result Value Ref Range   WBC 14.0 (H) 4.0 - 10.5 K/uL   RBC 4.60 4.22 - 5.81 MIL/uL   Hemoglobin 14.2 13.0 - 17.0 g/dL   HCT 42.4 39.0 - 52.0 %   MCV 92.2 78.0 - 100.0 fL   MCH 30.9 26.0 - 34.0 pg   MCHC 33.5 30.0 - 36.0 g/dL   RDW 13.2 11.5 - 15.5 %   Platelets 170 150 - 400 K/uL   No results found for this or any previous visit (from the past 240 hour(s)). Creatinine: No results for input(s): CREATININE in the last 168 hours.   Impression/Assessment:  Prostate cancer  Plan:  The risks/benefits/alternatives to robot assisted laparoscopic radical prostatectomy was explained to the patient and he understands and wishes to proceed with surgery  [name] L 08/30/2015, 6:42 PM

## 2015-08-30 NOTE — Brief Op Note (Signed)
08/30/2015  5:04 PM  PATIENT:  Corlis Hove  64 y.o. male  PRE-OPERATIVE DIAGNOSIS:  PROSTATE CANCER  POST-OPERATIVE DIAGNOSIS:  PROSTATE CANCER  PROCEDURE:  Procedure(s): ROBOTIC ASSISTED LAPAROSCOPIC RADICAL PROSTATECTOMY (N/A) PELVIC LYMPHADENECTOMY (Bilateral)  SURGEON:  Surgeon(s) and Role:    * [name], MD - Primary  PHYSICIAN ASSISTANT:   ASSISTANTS: none   ANESTHESIA:   general  EBL:  Total I/O In: 2000 [I.V.:2000] Out: 200 [Blood:200]  BLOOD ADMINISTERED:none  DRAINS: (1) Jackson-Pratt drain(s) with closed bulb suction in the LLQ and Urinary Catheter (Foley)   LOCAL MEDICATIONS USED:  MARCAINE     SPECIMEN:  Source of Specimen:  Prostate, seminal vesicles, pelvic lymph nodes  DISPOSITION OF SPECIMEN:  PATHOLOGY  COUNTS:  YES  TOURNIQUET:  * No tourniquets in log *  DICTATION: .Note written in EPIC  PLAN OF CARE: Admit to inpatient   PATIENT DISPOSITION:  PACU - hemodynamically stable.   Delay start of Pharmacological VTE agent (>24hrs) due to surgical blood loss or risk of bleeding: yes

## 2015-08-30 NOTE — Transfer of Care (Signed)
Immediate Anesthesia Transfer of Care Note  Patient: [name]  Procedure(s) Performed: Procedure(s): ROBOTIC ASSISTED LAPAROSCOPIC RADICAL PROSTATECTOMY (N/A) PELVIC LYMPHADENECTOMY (Bilateral)  Patient Location: PACU  Anesthesia Type:General  Level of Consciousness: awake, alert , oriented and patient cooperative  Airway & Oxygen Therapy: Patient Spontanous Breathing and Patient connected to face mask oxygen  Post-op Assessment: Report given to RN and Post -op Vital signs reviewed and stable  Post vital signs: Reviewed and stable  Last Vitals:  Filed Vitals:   08/30/15 0958  BP: 143/84  Pulse: 84  Temp: 36.4 C  Resp: 18    Complications: No apparent anesthesia complications

## 2015-08-30 NOTE — Anesthesia Postprocedure Evaluation (Signed)
  Anesthesia Post-op Note  Patient: [name]  Procedure(s) Performed: Procedure(s) (LRB): ROBOTIC ASSISTED LAPAROSCOPIC RADICAL PROSTATECTOMY (N/A) PELVIC LYMPHADENECTOMY (Bilateral)  Patient Location: PACU  Anesthesia Type: General  Level of Consciousness: awake and alert   Airway and Oxygen Therapy: Patient Spontanous Breathing  Post-op Pain: mild  Post-op Assessment: Post-op Vital signs reviewed, Patient's Cardiovascular Status Stable, Respiratory Function Stable, Patent Airway and No signs of Nausea or vomiting  Last Vitals:  Filed Vitals:   08/30/15 1730  BP: 145/80  Pulse: 89  Temp:   Resp: 20    Post-op Vital Signs: stable   Complications: No apparent anesthesia complications

## 2015-08-31 ENCOUNTER — Encounter (HOSPITAL_COMMUNITY): Payer: Self-pay | Admitting: Urology

## 2015-08-31 LAB — BASIC METABOLIC PANEL
Anion gap: 5 (ref 5–15)
BUN: 17 mg/dL (ref 6–20)
CO2: 31 mmol/L (ref 22–32)
Calcium: 8.2 mg/dL — ABNORMAL LOW (ref 8.9–10.3)
Chloride: 104 mmol/L (ref 101–111)
Creatinine, Ser: 1.17 mg/dL (ref 0.61–1.24)
GFR calc Af Amer: >60 mL/min (ref >60)
GFR calc non Af Amer: >60 mL/min (ref >60)
Glucose, Bld: 96 mg/dL (ref 65–99)
Potassium: 4 mmol/L (ref 3.5–5.1)
Sodium: 140 mmol/L (ref 135–145)

## 2015-08-31 LAB — CBC
HCT: 38.3 % — ABNORMAL LOW (ref 39.0–52.0)
Hemoglobin: 12.6 g/dL — ABNORMAL LOW (ref 13.0–17.0)
MCH: 30.8 pg (ref 26.0–34.0)
MCHC: 32.9 g/dL (ref 30.0–36.0)
MCV: 93.6 fL (ref 78.0–100.0)
Platelets: 162 10*3/uL (ref 150–400)
RBC: 4.09 MIL/uL — ABNORMAL LOW (ref 4.22–5.81)
RDW: 13.6 % (ref 11.5–15.5)
WBC: 12 10*3/uL — ABNORMAL HIGH (ref 4.0–10.5)

## 2015-08-31 MED ORDER — SODIUM CHLORIDE 0.9 % IV BOLUS (SEPSIS)
500.0000 mL | Freq: Once | INTRAVENOUS | Status: AC
  Administered 2015-08-31: 500 mL via INTRAVENOUS

## 2015-08-31 NOTE — Progress Notes (Signed)
Pt's urine output has been 319mL since 19:00 8/31. On call NP, D. Rosalyn Gess, made aware. Will continue to monitor pt closely. Carnella Gu[place] I

## 2015-08-31 NOTE — Progress Notes (Signed)
1 Day Post-Op Subjective: The patient is doing well.  No nausea or vomiting. Pain is adequately controlled. JP output slightly elevated.  Objective: Vital signs in last 24 hours: Temp:  [97.8 F (36.6 C)-98.7 F (37.1 C)] 98.2 F (36.8 C) (09/01 1329) Pulse Rate:  [80-91] 81 (09/01 1329) Resp:  [14-28] 16 (09/01 1329) BP: (88-167)/(44-107) 107/60 mmHg (09/01 1329) SpO2:  [92 %-100 %] 95 % (09/01 1329) Weight:  [99.973 kg (220 lb 6.4 oz)] 99.973 kg (220 lb 6.4 oz) (08/31 1821)  Intake/Output from previous day: 08/31 0701 - 09/01 0700 In: 5415 [P.O.:1000; I.V.:3415; IV Piggyback:1000] Out: 710 [Urine:350; Drains:160; Blood:200] Intake/Output this shift: Total I/O In: 1400 [P.O.:600; I.V.:800] Out: 845 [Urine:750; Drains:95]  Physical Exam:  General: Alert and oriented. CV: RRR Lungs: Clear bilaterally. GI: Soft, Nondistended. Incisions: Clean, dry, and intact, JP with serous drainage  Urine: Clear Extremities: Nontender, no erythema, no edema.  Lab Results:  Recent Labs  08/30/15 1755 08/31/15 0512  HGB 14.2 12.6*  HCT 42.4 38.3*      Assessment/Plan: POD# 1 s/p robotic prostatectomy.  1) SL IVF 2) Ambulate, Incentive spirometry 3) Transition to oral pain medication 4) Dulcolax suppository 5) Follow pelvic drain 6) Plan for discharge on POD#2 if doing well    LOS: 1 day   [name] 08/31/2015, 4:21 PM

## 2015-08-31 NOTE — Discharge Instructions (Signed)
1. Activity:  You are encouraged to ambulate frequently (about every hour during waking hours) to help prevent blood clots from forming in your legs or lungs.  However, you should not engage in any heavy lifting (> 10-15 lbs), strenuous activity, or straining. 2. Diet: You should continue a clear liquid diet until passing gas from below.  Once this occurs, you may advance your diet to a soft diet that would be easy to digest (i.e soups, scrambled eggs, mashed potatoes, etc.) for 24 hours just as you would if getting over a bad stomach flu.  If tolerating this diet well for 24 hours, you may then begin eating regular food.  It will be normal to have some amount of bloating, nausea, and abdominal discomfort intermittently. 3. Prescriptions:  You will be provided a prescription for pain medication to take as needed.  If your pain is not severe enough to require the prescription pain medication, you may take Tylenol instead.  You should also take an over the counter stool softener (Colace 100 mg twice daily) to avoid straining with bowel movements as the pain medication may constipate you. Finally, you will also be provided a prescription for an antibiotic to begin the day prior to your return visit in the office for catheter removal. 4. Catheter care: You will be taught how to take care of the catheter by the nursing staff prior to discharge from the hospital.  You may use both a leg bag and the larger bedside bag but it is recommended to at least use the bigger bedside bag at nighttime as the leg bag is small and will fill up overnight and also does not drain as well when lying flat. You may periodically feel a strong urge to void with the catheter in place.  This is a bladder spasm and most often can occur when having a bowel movement or when you are moving around. It is typically self-limited and usually will stop after a few minutes.  You may use some Vaseline or Neosporin around the tip of the catheter to  reduce friction at the tip of the penis. 5. Incisions: You may remove your dressing bandages the 2nd day after surgery.  You most likely will have a few small staples in each of the incisions and once the bandages are removed, the incisions may stay open to air.  You may start showering (not soaking or bathing in water) 48 hours after surgery and the incisions simply need to be patted dry after the shower.  No additional care is needed. 6. What to call us about: You should call the office [phone]) if you develop fever > 101, persistent vomiting, or the catheter stops draining. Also, feel free to call with any other questions you may have and remember the handout that was provided to you as a reference preoperatively which answers many of the common questions that arise after surgery. 7. You may resume aspirin, advil, aleve, vitamins, and supplements 7 days after surgery.  You may resume Coumadin on the day after discharge home.

## 2015-09-01 LAB — BASIC METABOLIC PANEL
Anion gap: 7 (ref 5–15)
BUN: 12 mg/dL (ref 6–20)
CO2: 28 mmol/L (ref 22–32)
Calcium: 8.1 mg/dL — ABNORMAL LOW (ref 8.9–10.3)
Chloride: 105 mmol/L (ref 101–111)
Creatinine, Ser: 1.11 mg/dL (ref 0.61–1.24)
GFR calc Af Amer: >60 mL/min (ref >60)
GFR calc non Af Amer: >60 mL/min (ref >60)
Glucose, Bld: 104 mg/dL — ABNORMAL HIGH (ref 65–99)
Potassium: 3.5 mmol/L (ref 3.5–5.1)
Sodium: 140 mmol/L (ref 135–145)

## 2015-09-01 LAB — CREATININE, FLUID (PLEURAL, PERITONEAL, JP DRAINAGE): Creat, Fluid: 1.2 mg/dL

## 2015-09-01 LAB — CBC
HCT: 36 % — ABNORMAL LOW (ref 39.0–52.0)
Hemoglobin: 11.9 g/dL — ABNORMAL LOW (ref 13.0–17.0)
MCH: 30.8 pg (ref 26.0–34.0)
MCHC: 33.1 g/dL (ref 30.0–36.0)
MCV: 93.3 fL (ref 78.0–100.0)
Platelets: 140 10*3/uL — ABNORMAL LOW (ref 150–400)
RBC: 3.86 MIL/uL — ABNORMAL LOW (ref 4.22–5.81)
RDW: 13.6 % (ref 11.5–15.5)
WBC: 11.5 10*3/uL — ABNORMAL HIGH (ref 4.0–10.5)

## 2015-09-01 MED ORDER — OXYCODONE-ACETAMINOPHEN 5-325 MG PO TABS: 1.0000 | ORAL_TABLET | ORAL | Status: DC | PRN

## 2015-09-01 NOTE — Progress Notes (Signed)
2 Days Post-Op Subjective: The patient is doing well.  No nausea or vomiting. Pain is adequately controlled. JP output elevated.  Objective: Vital signs in last 24 hours: Temp:  [98.1 F (36.7 C)-98.6 F (37 C)] 98.3 F (36.8 C) (09/02 1450) Pulse Rate:  [81-90] 81 (09/02 1450) Resp:  [16-18] 18 (09/02 1450) BP: (106-119)/(52-70) 116/70 mmHg (09/02 1450) SpO2:  [92 %-97 %] 95 % (09/02 1450)  Intake/Output from previous day: 09/01 0701 - 09/02 0700 In: 3960 [P.O.:1560; I.V.:2400] Out: 2275 [Urine:1800; Drains:475] Intake/Output this shift: Total I/O In: 1320 [P.O.:520; I.V.:800] Out: 1050 [Urine:800; Drains:250]  Physical Exam:  General: Alert and oriented. CV: RRR Lungs: Clear bilaterally. GI: Soft, Nondistended. Incisions: Clean, dry, and intact, JP with serous drainage  Urine: Clear Extremities: Nontender, no erythema, no edema.  Lab Results:  Recent Labs  08/30/15 1755 08/31/15 0512 09/01/15 0715  HGB 14.2 12.6* 11.9*  HCT 42.4 38.3* 36.0*      Assessment/Plan: s/p robotic prostatectomy on 08/30/15  1) SL IVF 2) Ambulate, Incentive spirometry 3) Transition to oral pain medication 4) Dulcolax suppository 5) Follow pelvic drain 6) light traction on Foley    LOS: 2 days   Christell Faith 09/01/2015, 3:54 PM

## 2015-09-01 NOTE — Progress Notes (Signed)
Completed D/C teaching with patient. Answered all questions. Demonstrated foley bag and JP drain care. Pt will be D/C home with family in stable condition.  Cathie Hoops, RN

## 2015-09-05 NOTE — Op Note (Signed)
PREOPERATIVE DIAGNOSIS: Prostate Cancer  POSTOPERATIVE DIAGNOSIS: Prostate Cancer  PROCEDURES: 1. Robotic-assisted laparoscopic radical prostatectomy. 2. Bilateral pelvic lymphadenectomy.  ANESTHESIA: General  ASSISTANT: Clemetine Marker, PA  ESTIMATED BLOOD LOSS: 200 mL.  COMPLICATIONS: None.  SPECIMEN: 1.Radical prostatectomy for permanent. 2. Bilateral external iliac, common iliac and obturator lymph nodes  ANTIBIOTICS: ancef  FINDINGS: 1. Small prostate with narrow pelvis. Water tight anastomosis. Multiple enlarged bilateral pelvic lymph nodes  DRAINS: 1. Jackson-Pratt drain to bulb suction. 2. Foley catheter to straight drain.  INDICATION: [name] is a very pleasant 64 year old gentleman, who was found on workup with elevated PSA to have intermediate risk prostate cancer. Options were discussed with the patient in detail for primary manage including continued surveillance protocols versus surgical extirpation with and without minimally invasive assistance versus primary radiotherapy and he wished to proceed with robotic prostatectomy. Informed consent was obtained and placed in the medical record.  PROCEDURE IN DETAIL: The patient was brought to the operating room and a breif timeout was down to ensure correct patient, correct procedure, and correct site. Intravenous antibiotics were administered. General endotracheal anesthesia was introduced. The patient was placed into a low lithotomy position after tucking his arms with foam padding, placing on a pink and non-slide foam pad. A test of steep Trendelenburg positioning was performed and he was found to be suitably positioned. Sterile field was created by prepping and draping the patient's penis, perineum and proximal thighs using iodine and his infra-xiphoid abdomen using chlorhexidine gluconate. Next, a high-flow, low-pressure pneumoperitoneum was obtained using Veress technique in the infraumbilical  midline having passed the aspiration and drop test. Next, a 12-mm robotic camera port was placed in the same location. Laparoscopic examination of the peritoneal cavity revealed no significant adhesions and no visceral injury. Additional ports were placed as follows: Right paramedian 8-mm robotic port, right far lateral 12-mm assistant port, left paramedian 8-mm robotic port, left far lateral 8-mm robotic port, and right paramedian 5-mm suction port. Robot was docked and passed through the electronic checks. Next, attention was directed for the development of space of Retzius. Incision was made lateral to the left medial umbilical ligament from the midline towards the area of the internal ring and coursing along the iliac vessels towards the area of the ureter, which was positively identified. During this dissection, the vas deferens was purposely visualized, ligated and uses above the handle for medial traction. The left bladder was dissected away from the pelvic sidewall towards the area of the endopelvic fascia. A mirror-imaged dissection was performed on the right side positively identifying the right ureter and purposely severing the right vas deferens. Additional anterior attachments were taken down using cautery scissors. This exposed the anterior base of the prostate, which was defatted with fibrofatty tissue set aside, labeled periprostatic fat to expose the area of the bladder neck and prostate junction more clearly. Next, the endopelvic fascia was identified and swept away from the lateral aspect of the prostate in a base-to-apex orientation to expose the dorsal venous complex, which was carefully controlled using vascular load stapler taking great care to avoid membranous urethral injury, which did not occur.  Next, the bladder neck was identified moving the Foley catheter back and forth and dissection was performed in the anterior-posterior direction separating the  bladder neck away from the base of the prostate taking great care to avoid excessive caliber bladder neck, which did not occur. Posterior dissection was performed by incising approximately 7 mm inferior- posterior to posterior lip of the  prostate entering the plane of Denonvilliers.. Plane of Denonvilliers was carefully developed. Bilateral vas deferenses were encountered, dissected for distance of 4 cm, ligated, and placed on gentle superior traction. Bilateral seminal vesicles were dissected towards their tips and also placed on gentle superior traction and the posterior plane was further developed towards the apex of the prostate. This exposed the vascular pedicles bilaterally. First, on the left side, the left vascular pedicle was controlled using a sequential clipping technique performing minimal nerve-sparing given the patient's high-risk disease. Similarly, the right pedicle was controlled taking a very purposely wide dissection again performing purposely non-nerve-sparing on the right as well. Apical dissection was performed in the anterior plane by identifying the membranous urethra placed into prostate on gentle superior traction and transecting coldly, this completely freed up the prostatectomy specimen, which was placed into an EndoCatch bag for later retrieval. We then turned our attention to the lymph node dissection. As such, standard template lymphandenectomy was performed on the right side first at the right external iliac group to the confines being right external iliac artery, vein, pelvic side wall, iliac bifurcation. Lymphostasis was achieved with cold clips. This was set aside and labeled the right external iliac lymph nodes. The right obturator group was also removed with confines being right obturator nerve, pelvic side wall, external iliac vein. Lymphostasis was again achieved with hem-o-lock clips.The obturator nerve was inspected following these  maneuvers and found to be uninjured. A mirror-imaged lymphadenectomy was performed on the left side as left external iliac and left obturator groups respectively in the more proximal aspect of the obturator. As such, the left obturator nerve was inspected and found to be intact following these maneuvers.   We then turned our attention to the bladder urethra anastomosis. Posterior reconstruction was performed using a single 3-0 V-Loc seizure reapproximating the posterior urethral plate to the posterior bladder neck bringing these structures into tension-free apposition. Next, mucosa-to-mucosa anastomosis was performed using double-armed V-Loc suture from the 6 o'clock to 12 o'clock position with anchoring of this stitch to the puboprostatic ligaments at the 12 o'clock position, thus performing anterior reconstruction. A Foley catheter was then placed per urethra easily, which irrigated quantitatively.  Hemostasis appeared excellent. All sponge and needle counts were correct. A closed suction drain was brought to the previous left lateral robotic port site into the area of the peritoneal cavity. The previous right 12-mm assistant port was closed at the level of the fascia using a Carter-Thomason suture passer under laparoscopic vision. Robot was undocked. Specimen was retrieved by extending the previous camera port site inferiorly for distance approximately 3 cm and removing the prostatectomy specimens and setting aside for permanent pathology. The site was closed at the level of fascia using figure-of-eight PDS x3 followed by reapproximation of the Scarpa's using running Vicryl. All incision sites were infiltrated with dilute Lyophilized Marcaine and closed at the level of the skin using subcuticular Monocryl followed by Dermabond. Procedure was then terminated. The patient tolerated the procedure well. There were no immediate periprocedural complications and the patient was  taken to the postanesthesia care unit in stable Condition.  COMPLICATIONS: None  CONDITION: Stable, extubated, transferred to PACU  PLAN: The patient will be admitted for 1-2 for hydration, post operative monitoring and pain control. He will be discharged home with foley in place and foley will be removed in 7 days

## 2015-09-05 NOTE — Discharge Summary (Signed)
Physician Discharge Summary  Patient ID: [name] MRN: [id number] DOB/AGE: 1951/06/10 64 y.o.  Admit date: 08/30/2015 Discharge date: 09/01/2015  Admission Diagnoses: prostate cancer  Discharge Diagnoses:  Active Problems:   Prostate cancer   Discharged Condition: good  Hospital Course: The patient tolerated the procedure well and was transferred to the floor on IV pain meds, IV fluid. On POD#1 pt was started on clear liquid diet and they ambulated in the halls. On POD#2 the patient was transitioned to a regular diet, IVFs were discontinued, and the patient passed flatus. The JP drain continued to have high output with 450cc on POD#1. The JP was sent for creatinine and the value was equivalent to serum.Prior to discharge the pt was tolerating a regular diet, pain was controlled on PO pain meds, they were ambulating without difficulty, and they had normal bowel function. He was discharged home with JP drain and foley catheter   Consults: None  Significant Diagnostic Studies: JP creatinine: 1.2  Treatments: surgery: robotic assisted laparoscopic radical prostatectomy and pelvic lymph node dissection  Discharge Exam: Blood pressure 116/70, pulse 81, temperature 98.3 F (36.8 C), temperature source Oral, resp. rate 18, height 5\' 9"  (1.753 m), weight 99.973 kg (220 lb 6.4 oz), SpO2 95 %. General appearance: alert, cooperative and appears stated age Head: Normocephalic, without obvious abnormality, atraumatic Eyes: conjunctivae/corneas clear. PERRL, EOM's intact. Fundi benign. Nose: Nares normal. Septum midline. Mucosa normal. No drainage or sinus tenderness. Neck: no adenopathy, no carotid bruit, no JVD, supple, symmetrical, trachea midline and thyroid not enlarged, symmetric, no tenderness/mass/nodules Resp: clear to auscultation bilaterally Cardio: regular rate and rhythm, S1, S2 normal, no murmur, click, rub or gallop GI: soft, non-tender; bowel sounds normal; no masses,  no  organomegaly Male genitalia: normal, penis: no lesions or discharge. testes: no masses or tenderness. no hernias Extremities: extremities normal, atraumatic, no cyanosis or edema Pulses: 2+ and symmetric Neurologic: Alert and oriented X 3, normal strength and tone. Normal symmetric reflexes. Normal coordination and gait Incision/Wound: clean/dry/intact  Disposition: 01-Home or Self Care     Medication List    TAKE these medications        allopurinol 100 MG tablet  Commonly known as:  ZYLOPRIM  Take 200 mg by mouth at bedtime.     beclomethasone 80 MCG/ACT inhaler  Commonly known as:  QVAR  Inhale 2 puffs into the lungs 2 (two) times daily. As needed     ciprofloxacin 500 MG tablet  Commonly known as:  CIPRO  Take 1 tablet (500 mg total) by mouth 2 (two) times daily. Start day prior to office visit for foley removal     colchicine 0.6 MG tablet  Take 0.6 mg by mouth at bedtime.     dicyclomine 10 MG capsule  Commonly known as:  BENTYL  Take 40 mg by mouth 2 (two) times daily.     DILANTIN 100 MG ER capsule  Generic drug:  phenytoin  Take 100-200 mg by mouth 2 (two) times daily. Take one capsule in the morning and 2 capsules at bedtime     docusate sodium 100 MG capsule  Commonly known as:  COLACE  Take 100 mg by mouth at bedtime.     enoxaparin 30 MG/0.3ML injection  Commonly known as:  LOVENOX  Inject 30 mg into the skin every 12 (twelve) hours. To begin 08/25/2015     furosemide 40 MG tablet  Commonly known as:  LASIX  TAKE 2 TABLETS (80 MG TOTAL) BY  MOUTH DAILY.     HYDROcodone-acetaminophen 5-325 MG per tablet  Commonly known as:  NORCO  Take 1-2 tablets by mouth every 6 (six) hours as needed.     omeprazole 10 MG capsule  Commonly known as:  PRILOSEC  Take 10 mg by mouth at bedtime.     oxyCODONE-acetaminophen 5-325 MG per tablet  Commonly known as:  PERCOCET/ROXICET  Take 1-2 tablets by mouth every 4 (four) hours as needed for moderate pain.      warfarin 3 MG tablet  Commonly known as:  COUMADIN  Take 3 mg by mouth at bedtime.           Follow-up Information    Follow up with [name], MD On 09/07/2015.   Specialty:  Urology   Why:  at 9:00   Contact information:   [place] [address] [phone]       Follow up with [name], MD. Call on 09/07/2015.   Specialty:  Urology   Why:  voiding trial and JP drain removal   Contact information:   [place] [address] [phone]       Signed: [name] 09/05/2015, 10:37 PM

## 2015-09-12 ENCOUNTER — Other Ambulatory Visit: Payer: Self-pay | Admitting: Cardiovascular Disease

## 2015-09-20 ENCOUNTER — Ambulatory Visit (INDEPENDENT_AMBULATORY_CARE_PROVIDER_SITE_OTHER): Payer: Self-pay | Admitting: Urology

## 2015-09-20 DIAGNOSIS — L03311 Cellulitis of abdominal wall: Secondary | ICD-10-CM

## 2015-09-20 DIAGNOSIS — C61 Malignant neoplasm of prostate: Secondary | ICD-10-CM

## 2015-09-27 ENCOUNTER — Ambulatory Visit (INDEPENDENT_AMBULATORY_CARE_PROVIDER_SITE_OTHER): Payer: Self-pay | Admitting: Urology

## 2015-09-27 DIAGNOSIS — L03311 Cellulitis of abdominal wall: Secondary | ICD-10-CM

## 2015-10-04 ENCOUNTER — Ambulatory Visit (INDEPENDENT_AMBULATORY_CARE_PROVIDER_SITE_OTHER): Payer: Self-pay | Admitting: Urology

## 2015-10-04 DIAGNOSIS — L03311 Cellulitis of abdominal wall: Secondary | ICD-10-CM

## 2015-10-11 ENCOUNTER — Ambulatory Visit (INDEPENDENT_AMBULATORY_CARE_PROVIDER_SITE_OTHER): Payer: Self-pay | Admitting: Urology

## 2015-10-11 DIAGNOSIS — C61 Malignant neoplasm of prostate: Secondary | ICD-10-CM

## 2015-10-11 DIAGNOSIS — R972 Elevated prostate specific antigen [PSA]: Secondary | ICD-10-CM

## 2015-10-14 ENCOUNTER — Other Ambulatory Visit: Payer: Self-pay | Admitting: Cardiovascular Disease

## 2015-10-19 ENCOUNTER — Ambulatory Visit: Payer: BLUE CROSS/BLUE SHIELD | Admitting: Cardiovascular Disease

## 2015-11-07 ENCOUNTER — Encounter: Payer: Self-pay | Admitting: Cardiovascular Disease

## 2015-11-08 ENCOUNTER — Ambulatory Visit (INDEPENDENT_AMBULATORY_CARE_PROVIDER_SITE_OTHER): Payer: Self-pay | Admitting: Urology

## 2015-11-08 DIAGNOSIS — R972 Elevated prostate specific antigen [PSA]: Secondary | ICD-10-CM

## 2015-11-08 DIAGNOSIS — N393 Stress incontinence (female) (male): Secondary | ICD-10-CM

## 2015-11-15 ENCOUNTER — Ambulatory Visit: Payer: BLUE CROSS/BLUE SHIELD | Admitting: Cardiovascular Disease

## 2015-12-15 ENCOUNTER — Other Ambulatory Visit: Payer: Self-pay | Admitting: Cardiovascular Disease

## 2015-12-15 NOTE — Telephone Encounter (Signed)
Rx(s) sent to pharmacy electronically.  

## 2015-12-18 ENCOUNTER — Ambulatory Visit (INDEPENDENT_AMBULATORY_CARE_PROVIDER_SITE_OTHER): Payer: BLUE CROSS/BLUE SHIELD | Admitting: Cardiovascular Disease

## 2015-12-18 ENCOUNTER — Encounter: Payer: Self-pay | Admitting: Cardiovascular Disease

## 2015-12-18 VITALS — BP 128/80 | HR 68 | Resp 16 | Ht 69.0 in | Wt 223.0 lb

## 2015-12-18 DIAGNOSIS — Z7901 Long term (current) use of anticoagulants: Secondary | ICD-10-CM

## 2015-12-18 DIAGNOSIS — I87093 Postthrombotic syndrome with other complications of bilateral lower extremity: Secondary | ICD-10-CM | POA: Diagnosis not present

## 2015-12-18 NOTE — Progress Notes (Signed)
Patient ID: [name], male   DOB: [birth year], 64 y.o.   MRN: [id number]    Cardiology Office Note    Date:  12/18/2015   ID:  DARRIAN WESTLAND, DOB Aug 20, 1951, MRN [id number]  PCP:  Purvis Kilts, MD  Cardiologist:   [name], MD   Chief Complaint  Patient presents with  . Annual Exam    no chest pain, no shortness of breath, has edema, no pain or cramping in legs, no lightheaded or dizziness    History of Present Illness:  [name] is a 64 y.o. male  History of recurrent right lower extremity deep venous thrombosis and postphlebitic syndrome. Since his last appointment he had a small posttraumatic ulcer in his right pretibial area that thankfully healed with conservative management. He has also been diagnosed with prostate cancer and underwent prostatectomy about 4 months ago. He has mild residual problems with urine leakage, but overall is doing well. After the diagnosis of cancer his long-term rheumatoid arthritis biological agent Orencia was discontinued and his joint symptoms are substantially worse. His knuckles are quite swollen and stiff. He is unable to pull on compression stockings. He asked me today for a prescription for prednisone , but I recommended that he consult with his rheumatologist first.  Past Medical History  Diagnosis Date  . Seizures ([place])   . GERD (gastroesophageal reflux disease)   . IBS (irritable bowel syndrome)   . Arthritis   . DDD (degenerative disc disease), cervical     with UE's paresthesias  . Diverticulosis   . Peripheral edema   . Sleep apnea     not on cpap  . Prostate cancer ([place])   . Asthma   . Gout   . Hypercholesterolemia   . IBS (irritable bowel syndrome)   . COPD (chronic obstructive pulmonary disease) ([place])   . DVT, lower extremity ([place])     bilat  . Shortness of breath dyspnea     exertion   . Pneumonia     hx of   . Anxiety     hx of   . History of shingles   . History of measles   . 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 Cholecystectomy    . Doppler echocardiography N/A 01-21-2012    TECHNICALLY DIFFICULT. MILD CONCENTRIC LV HYPERTROPHY. LV CAVITY IS SMALL.. EF=> 55%. TRANSMITRAL SPECTRAL FLOW PATTREN IS SUGGESTIVE OF IMPAIRED LV RELAXATION. RV SYSTOLIC PRESSURE IS [id number]. LEFT ATRIAL SIZE IS NORMAL. AV APPEARS MILDLY SCLEROTIC. NO SIGN VALVE DISEASE NOTED.  . Nuclear stress test N/A 01-21-2012    NORMAL PATTERN OF PERFUSION IN ALL REGIONS. POST STRESS LV SIZE IS NORMAL. NO EVIDENCE OF INDUCIBLE ISCHEMIA. EF 54%.  . US venous lower ext Right 03/07/11    PERSISTENT DVT IN RIGHT LOWER EXT.WITH PERSISTANT VISUALIZATION OF HYPOECHOIC THROMBUS WITHIN THE FEMORAL, PROFUNDA FEMORAL AND POPLITEAL VEINS. WHEN COMPARED TO PREVIOUS, CLOT IS NO LONGER IDENTIFIED WITH IN THE RIGHT CFV.  . Prostate biopsy    . Robot assisted laparoscopic radical prostatectomy N/A 08/30/2015    Procedure: ROBOTIC ASSISTED LAPAROSCOPIC RADICAL PROSTATECTOMY;  Surgeon: [name], MD;  Location: [facility];  Service: Urology;  Laterality: N/A;  . Lymphadenectomy Bilateral 08/30/2015    Procedure: PELVIC LYMPHADENECTOMY;  Surgeon: [name], MD;  Location: [facility];  Service: Urology;  Laterality: Bilateral;    Current Outpatient Prescriptions  Medication Sig Dispense Refill  . allopurinol (ZYLOPRIM) 100 MG tablet Take 200 mg by mouth at bedtime.     . colchicine 0.6 MG tablet Take 0.6 mg by mouth at bedtime.     . dicyclomine (BENTYL) 10 MG capsule Take 40 mg by mouth 2 (two) times daily.    . furosemide (LASIX) 40 MG tablet Take 1 tablet (40 mg total) by mouth 2 (two) times daily. 60 tablet 2  . HYDROcodone-acetaminophen (NORCO) 5-325 MG per tablet Take 1-2 tablets by mouth every 6 (six) hours as needed. 30 tablet 0  . omeprazole (PRILOSEC) 10 MG capsule Take 10 mg by mouth at bedtime.      . phenytoin (DILANTIN) 100 MG ER capsule Take 100-200 mg by mouth 2 (two) times daily. Take one capsule in the morning and 2 capsules at bedtime    . warfarin (COUMADIN) 3 MG tablet Take 3 mg by mouth at bedtime.      No current facility-administered medications for this visit.    Allergies:   Levofloxacin; Orencia; Carbamazepine; Celecoxib; Cephalexin; Enbrel; Humira; and Sulfa antibiotics   Social History   Social History  . Marital Status: Married    Spouse Name: N/A  . Number of Children: 3  . Years of Education: N/A   Occupational History  . Biomedical engineer    Social History Main Topics  . Smoking status: Former Smoker -- 3.00 packs/day for 20 years    Types: Cigarettes    Quit date: 12/30/1989  . Smokeless tobacco: Never Used  . Alcohol Use: No  . Drug Use: No  . Sexual Activity: Yes   Other Topics Concern  . None   Social History Narrative     Family History:  The patient's family history includes Alzheimer's disease in his mother; Breast cancer in his sister; Esophageal cancer in his brother; Heart attack in his brother, brother, father, and sister; Skin cancer in his father; Stomach cancer in his father; Throat cancer in his brother and brother.   ROS:   Please see the history of present illness.    Review of Systems  Musculoskeletal: Positive for arthritis, joint swelling and stiffness.   All other systems reviewed and are negative.   PHYSICAL EXAM:   VS:  BP 128/80 mmHg  Pulse 68  Resp 16  Ht 5\' 9"  (1.753 m)  Wt 223 lb (101.152 kg)  BMI 32.92 kg/m2   GEN: Well nourished, well developed, in no acute distress HEENT: normal Neck: no JVD, carotid bruits, or masses Cardiac: RRR; no murmurs, rubs, or gallops.  3+ pitting edema of the right lower extremity almost to the knee, 1+ edema of the left ankle  Respiratory:  clear to auscultation bilaterally, normal work of breathing GI: soft, nontender, nondistended, + BS MS: no deformity or atrophy Skin:  warm and dry, no rash Neuro:  Alert and Oriented x 3, Strength and sensation are intact Psych: euthymic mood, full affect  Wt Readings from Last 3 Encounters:  12/18/15 223 lb (101.152 kg)  08/30/15 220 lb 6.4 oz (99.973 kg)  08/18/15 223 lb (101.152 kg)      Studies/Labs Reviewed:   EKG:  EKG is not ordered today.   Recent Labs: 08/18/2015: ALT 20 09/01/2015: BUN 12; Creatinine, Ser 1.11; Hemoglobin 11.9*; Platelets 140*; Potassium 3.5; Sodium 140    ASSESSMENT:    1. Complicated postphlebitic syndrome, bilateral   2. Chronic anticoagulation  PLAN:  In order of problems listed above:  1.  he really needs compression stockings. He works standing up all day long and is not planning to retire for another year. We discussed the risks of chronic skin swelling including the risk of venous stasis ulcers. Unfortunately, he cannot use conventional compression stockings due to his rheumatoid arthritis. Hopefully he'll be able to get some zip up compression stockings whenever he is not standing he should have his legs elevated. 2.  No bleeding complications on chronic warfarin anticoagulation. 3.  rheumatoid arthritis ; there are no great pharmacological options that I can offer. He should avoid nonsteroidal anti-inflammatory drugs due to warfarin anticoagulation. Prednisone is a short-term solution with potentially serious long-term consequences. I'm not sure whether methotrexate or a different biological agent is the best option at this time. Asked him to call his rheumatologist, Dr. Estanislado Pandy.   Medication Adjustments/Labs and Tests Ordered: Current medicines are reviewed at length with the patient today.  Concerns regarding medicines are outlined above.  Medication changes, Labs and Tests ordered today are listed below. Patient Instructions  Dr. Sallyanne Kuster recommends that you schedule a follow-up appointment in: one year        Signed, [name], MD  12/18/2015 3:01 PM      [facility] [address], [place], [place]  [address] Phone: [phone]; Fax: [phone]

## 2015-12-18 NOTE — Patient Instructions (Addendum)
Dr. Croitoru recommends that you schedule a follow-up appointment in: one year   

## 2016-02-14 ENCOUNTER — Ambulatory Visit (INDEPENDENT_AMBULATORY_CARE_PROVIDER_SITE_OTHER): Payer: BLUE CROSS/BLUE SHIELD | Admitting: Urology

## 2016-02-14 DIAGNOSIS — C61 Malignant neoplasm of prostate: Secondary | ICD-10-CM

## 2016-02-14 DIAGNOSIS — N393 Stress incontinence (female) (male): Secondary | ICD-10-CM

## 2016-02-14 DIAGNOSIS — N5201 Erectile dysfunction due to arterial insufficiency: Secondary | ICD-10-CM

## 2016-03-15 ENCOUNTER — Ambulatory Visit (HOSPITAL_COMMUNITY)
Admission: RE | Admit: 2016-03-15 | Discharge: 2016-03-15 | Disposition: A | Discharge status: Home / Self Care | Payer: BLUE CROSS/BLUE SHIELD | Source: Ambulatory Visit | Attending: Physician Assistant | Admitting: Physician Assistant

## 2016-03-15 ENCOUNTER — Other Ambulatory Visit (HOSPITAL_COMMUNITY): Payer: Self-pay | Admitting: Physician Assistant

## 2016-03-15 DIAGNOSIS — R609 Edema, unspecified: Secondary | ICD-10-CM

## 2016-03-15 DIAGNOSIS — I82409 Acute embolism and thrombosis of unspecified deep veins of unspecified lower extremity: Secondary | ICD-10-CM | POA: Insufficient documentation

## 2016-03-15 DIAGNOSIS — T801XXD Vascular complications following infusion, transfusion and therapeutic injection, subsequent encounter: Secondary | ICD-10-CM | POA: Insufficient documentation

## 2016-03-15 DIAGNOSIS — M7989 Other specified soft tissue disorders: Secondary | ICD-10-CM | POA: Insufficient documentation

## 2016-03-15 DIAGNOSIS — R600 Localized edema: Secondary | ICD-10-CM | POA: Diagnosis not present

## 2016-03-15 DIAGNOSIS — Z1389 Encounter for screening for other disorder: Secondary | ICD-10-CM | POA: Diagnosis not present

## 2016-04-09 ENCOUNTER — Encounter: Payer: Self-pay | Admitting: Gastroenterology

## 2016-04-24 DIAGNOSIS — Z7901 Long term (current) use of anticoagulants: Secondary | ICD-10-CM | POA: Diagnosis not present

## 2016-05-08 DIAGNOSIS — C61 Malignant neoplasm of prostate: Secondary | ICD-10-CM | POA: Diagnosis not present

## 2016-05-15 ENCOUNTER — Ambulatory Visit (INDEPENDENT_AMBULATORY_CARE_PROVIDER_SITE_OTHER): Payer: Medicare Other | Admitting: Urology

## 2016-05-15 DIAGNOSIS — N5201 Erectile dysfunction due to arterial insufficiency: Secondary | ICD-10-CM

## 2016-05-15 DIAGNOSIS — N393 Stress incontinence (female) (male): Secondary | ICD-10-CM | POA: Diagnosis not present

## 2016-05-15 DIAGNOSIS — C61 Malignant neoplasm of prostate: Secondary | ICD-10-CM | POA: Diagnosis not present

## 2016-05-29 ENCOUNTER — Other Ambulatory Visit: Payer: Self-pay | Admitting: *Deleted

## 2016-05-29 MED ORDER — FUROSEMIDE 40 MG PO TABS: 40.0000 mg | ORAL_TABLET | Freq: Two times a day (BID) | ORAL | Status: DC

## 2016-06-04 DIAGNOSIS — J343 Hypertrophy of nasal turbinates: Secondary | ICD-10-CM | POA: Diagnosis not present

## 2016-06-04 DIAGNOSIS — Z6833 Body mass index (BMI) 33.0-33.9, adult: Secondary | ICD-10-CM | POA: Diagnosis not present

## 2016-06-04 DIAGNOSIS — R0609 Other forms of dyspnea: Secondary | ICD-10-CM | POA: Diagnosis not present

## 2016-06-04 DIAGNOSIS — Z1389 Encounter for screening for other disorder: Secondary | ICD-10-CM | POA: Diagnosis not present

## 2016-06-04 DIAGNOSIS — J069 Acute upper respiratory infection, unspecified: Secondary | ICD-10-CM | POA: Diagnosis not present

## 2016-06-04 DIAGNOSIS — R070 Pain in throat: Secondary | ICD-10-CM | POA: Diagnosis not present

## 2016-06-04 DIAGNOSIS — R062 Wheezing: Secondary | ICD-10-CM | POA: Diagnosis not present

## 2016-06-05 ENCOUNTER — Telehealth: Payer: Self-pay

## 2016-06-05 ENCOUNTER — Encounter: Payer: Self-pay | Admitting: Gastroenterology

## 2016-06-05 ENCOUNTER — Ambulatory Visit (INDEPENDENT_AMBULATORY_CARE_PROVIDER_SITE_OTHER): Payer: Medicare Other | Admitting: Gastroenterology

## 2016-06-05 VITALS — BP 108/60 | HR 88 | Ht 69.0 in | Wt 226.0 lb

## 2016-06-05 DIAGNOSIS — Z7901 Long term (current) use of anticoagulants: Secondary | ICD-10-CM

## 2016-06-05 DIAGNOSIS — K5901 Slow transit constipation: Secondary | ICD-10-CM | POA: Diagnosis not present

## 2016-06-05 DIAGNOSIS — Z86010 Personal history of colonic polyps: Secondary | ICD-10-CM | POA: Diagnosis not present

## 2016-06-05 MED ORDER — NA SULFATE-K SULFATE-MG SULF 17.5-3.13-1.6 GM/177ML PO SOLN: 1.0000 | Freq: Once | ORAL | Status: DC

## 2016-06-05 MED ORDER — DICYCLOMINE HCL 10 MG PO CAPS: 40.0000 mg | ORAL_CAPSULE | Freq: Three times a day (TID) | ORAL | Status: DC

## 2016-06-05 NOTE — Telephone Encounter (Signed)
06/05/2016   RE: DEDERICK LIPSON DOB: 10/19/1951 MRN: [id number]   Dear Dr. Hilma Favors,    We have scheduled the above patient for an endoscopic procedure. Our records show that he is on anticoagulation therapy.   Please advise as to how long the patient may come off his therapy of coumadin prior to the procedure, which is scheduled for 07/15/16.  Please fax back/ or route the completed form to Marlon Pel, [place] at [phone].   Sincerely,    Marlon Pel

## 2016-06-05 NOTE — Patient Instructions (Addendum)
We have sent the following medications to your pharmacy for you to pick up at your convenience:Bentyl.  Increase your colace to four times a day and if you do not see improvement in your symptoms then start Miralax mixinf 17 grams in 8 oz of water/juice daily.  You have been scheduled for a colonoscopy. Please follow written instructions given to you at your visit today.  Please pick up your prep supplies at the pharmacy within the next 1-3 days. If you use inhalers (even only as needed), please bring them with you on the day of your procedure. Your physician has requested that you go to www.startemmi.com and enter the access code given to you at your visit today. This web site gives a general overview about your procedure. However, you should still follow specific instructions given to you by our office regarding your preparation for the procedure.  Normal BMI (Body Mass Index- based on height and weight) is between 19 and 25. Your BMI today is Body mass index is 33.36 kg/(m^2). [name] Please consider follow up  regarding your BMI with your Primary Care Provider.   Thank you for choosing me and [facility].  Pricilla Riffle. Dagoberto Ligas., MD., Marval Regal

## 2016-06-05 NOTE — Progress Notes (Signed)
    History of Present Illness: This is a 65 year old male returning for follow-up of adenomatous colon polyps and constipation. He is accompanied by his wife. He states he has constipation once or twice per week. She takes 2 Colace per day which improves his symptoms but does not resolve. He uses dicyclomine frequently for crampy abdominal pain and bloating this has been very effective in treating those symptoms. Last colonoscopy was performed in June 2014 and 4 adenomatous colon polyps were found. He was recommended to have a 3 year interval colonoscopy. He is obtained on Coumadin anticoagulation for history of recurrent DVTs. Denies weight loss, abdominal pain, diarrhea, change in stool caliber, melena, hematochezia, nausea, vomiting, dysphagia, reflux symptoms, chest pain.   Current Medications, Allergies, Past Medical History, Past Surgical History, Family History and Social History were reviewed in [employer] record.  Physical Exam: General: Well developed, well nourished, no acute distress Head: Normocephalic and atraumatic Eyes:  sclerae anicteric, EOMI Ears: Normal auditory acuity Mouth: No deformity or lesions Lungs: Clear throughout to auscultation Heart: Regular rate and rhythm; no murmurs, rubs or bruits Abdomen: Soft, non tender and non distended. No masses, hepatosplenomegaly or hernias noted. Normal Bowel sounds Rectal: deferred to colonoscopy Musculoskeletal: Symmetrical with no gross deformities  Pulses:  Normal pulses noted Extremities: No clubbing, cyanosis, edema or deformities noted Neurological: Alert oriented x 4, grossly nonfocal Psychological:  Alert and cooperative. Normal mood and affect  Assessment and Recommendations:  1. Constipation. Probable IBS-C. Increase Colace to 3-4 per day. Constipation remains problem begin MiraLAX once or twice daily titrated for adequate bowel movements. Follow-up high fiber diet with adequate daily water  intake. Continue dicyclomine 10 mg 4 times a day as needed.  2. Personal history of adenomatous colon polyps. Due for 3 year interval surveillance colonoscopy. The risks (including bleeding, perforation, infection, missed lesions, medication reactions and possible hospitalization or surgery if complications occur), benefits, and alternatives to colonoscopy with possible biopsy and possible polypectomy were discussed with the patient and they consent to proceed.   3. Chronic anticoagulation with Coumadin for recurrent DVTs. Hold Coumadin 5 days before procedure - will instruct when and how to resume after procedure. Low but real risk of cardiovascular event such as heart attack, stroke, embolism, thrombosis or ischemia/infarct of other organs off Coumadin explained and need to seek urgent help if this occurs. The patient consents to proceed. Will communicate by phone or EMR with patient's prescribing provider to confirm that holding Coumadin is reasonable in this case.

## 2016-06-12 ENCOUNTER — Other Ambulatory Visit (HOSPITAL_COMMUNITY): Payer: Self-pay | Admitting: Registered Nurse

## 2016-06-12 ENCOUNTER — Ambulatory Visit (HOSPITAL_COMMUNITY)
Admission: RE | Admit: 2016-06-12 | Discharge: 2016-06-12 | Disposition: A | Discharge status: Home / Self Care | Payer: Medicare Other | Source: Ambulatory Visit | Attending: Registered Nurse | Admitting: Registered Nurse

## 2016-06-12 DIAGNOSIS — R05 Cough: Secondary | ICD-10-CM | POA: Diagnosis not present

## 2016-06-12 DIAGNOSIS — J209 Acute bronchitis, unspecified: Secondary | ICD-10-CM | POA: Diagnosis not present

## 2016-06-12 DIAGNOSIS — J069 Acute upper respiratory infection, unspecified: Secondary | ICD-10-CM | POA: Diagnosis not present

## 2016-06-12 DIAGNOSIS — Z1389 Encounter for screening for other disorder: Secondary | ICD-10-CM | POA: Diagnosis not present

## 2016-06-12 DIAGNOSIS — R059 Cough, unspecified: Secondary | ICD-10-CM

## 2016-06-12 DIAGNOSIS — Z6832 Body mass index (BMI) 32.0-32.9, adult: Secondary | ICD-10-CM | POA: Diagnosis not present

## 2016-06-12 DIAGNOSIS — R079 Chest pain, unspecified: Secondary | ICD-10-CM | POA: Diagnosis not present

## 2016-06-12 DIAGNOSIS — R070 Pain in throat: Secondary | ICD-10-CM | POA: Diagnosis not present

## 2016-06-12 DIAGNOSIS — R0602 Shortness of breath: Secondary | ICD-10-CM | POA: Diagnosis not present

## 2016-06-24 NOTE — Telephone Encounter (Signed)
Per Dr. Delanna Ahmadi recommendations patient can hold coumadin 5 days prior to his procedure. Patient notified and he verbalized understanding. Letter to be scanned into Epic.

## 2016-07-15 ENCOUNTER — Encounter: Payer: Medicare Other | Admitting: Gastroenterology

## 2016-07-17 ENCOUNTER — Encounter: Payer: Self-pay | Admitting: Gastroenterology

## 2016-07-17 ENCOUNTER — Ambulatory Visit (AMBULATORY_SURGERY_CENTER): Payer: Medicare Other | Admitting: Gastroenterology

## 2016-07-17 VITALS — BP 114/71 | HR 71 | Temp 99.1°F | Resp 16 | Ht 69.0 in | Wt 226.0 lb

## 2016-07-17 DIAGNOSIS — G40909 Epilepsy, unspecified, not intractable, without status epilepticus: Secondary | ICD-10-CM | POA: Diagnosis not present

## 2016-07-17 DIAGNOSIS — D123 Benign neoplasm of transverse colon: Secondary | ICD-10-CM | POA: Diagnosis not present

## 2016-07-17 DIAGNOSIS — F419 Anxiety disorder, unspecified: Secondary | ICD-10-CM | POA: Diagnosis not present

## 2016-07-17 DIAGNOSIS — J449 Chronic obstructive pulmonary disease, unspecified: Secondary | ICD-10-CM | POA: Diagnosis not present

## 2016-07-17 DIAGNOSIS — Z86010 Personal history of colonic polyps: Secondary | ICD-10-CM

## 2016-07-17 DIAGNOSIS — J45909 Unspecified asthma, uncomplicated: Secondary | ICD-10-CM | POA: Diagnosis not present

## 2016-07-17 DIAGNOSIS — G4733 Obstructive sleep apnea (adult) (pediatric): Secondary | ICD-10-CM | POA: Diagnosis not present

## 2016-07-17 DIAGNOSIS — R0602 Shortness of breath: Secondary | ICD-10-CM | POA: Diagnosis not present

## 2016-07-17 DIAGNOSIS — D125 Benign neoplasm of sigmoid colon: Secondary | ICD-10-CM | POA: Diagnosis not present

## 2016-07-17 MED ORDER — SODIUM CHLORIDE 0.9 % IV SOLN: 500.0000 mL | INTRAVENOUS | Status: DC

## 2016-07-17 NOTE — Progress Notes (Signed)
To PACU  Awake and alert.  Report to RN 

## 2016-07-17 NOTE — Patient Instructions (Signed)
YOU HAD AN ENDOSCOPIC PROCEDURE TODAY AT [facility]:   Refer to the procedure report that was given to you for any specific questions about what was found during the examination.  If the procedure report does not answer your questions, please call your gastroenterologist to clarify.  If you requested that your care partner not be given the details of your procedure findings, then the procedure report has been included in a sealed envelope for you to review at your convenience later.  YOU SHOULD EXPECT: Some feelings of bloating in the abdomen. Passage of more gas than usual.  Walking can help get rid of the air that was put into your GI tract during the procedure and reduce the bloating. If you had a lower endoscopy (such as a colonoscopy or flexible sigmoidoscopy) you may notice spotting of blood in your stool or on the toilet paper. If you underwent a bowel prep for your procedure, you may not have a normal bowel movement for a few days.  Please Note:  You might notice some irritation and congestion in your nose or some drainage.  This is from the oxygen used during your procedure.  There is no need for concern and it should clear up in a day or so.  SYMPTOMS TO REPORT IMMEDIATELY:   Following lower endoscopy (colonoscopy or flexible sigmoidoscopy):  Excessive amounts of blood in the stool  Significant tenderness or worsening of abdominal pains  Swelling of the abdomen that is new, acute  Fever of 100F or higher    For urgent or emergent issues, a gastroenterologist can be reached at any hour by calling [phone].   DIET: Your first meal following the procedure should be a small meal and then it is ok to progress to your normal diet. Heavy or fried foods are harder to digest and may make you feel nauseous or bloated.  Likewise, meals heavy in dairy and vegetables can increase bloating.  Drink plenty of fluids but you should avoid alcoholic beverages for 24  hours.  ACTIVITY:  You should plan to take it easy for the rest of today and you should NOT DRIVE or use heavy machinery until tomorrow (because of the sedation medicines used during the test).    FOLLOW UP: Our staff will call the number listed on your records the next business day following your procedure to check on you and address any questions or concerns that you may have regarding the information given to you following your procedure. If we do not reach you, we will leave a message.  However, if you are feeling well and you are not experiencing any problems, there is no need to return our call.  We will assume that you have returned to your regular daily activities without incident.  If any biopsies were taken you will be contacted by phone or by letter within the next 1-3 weeks.  Please call us at [phone] if you have not heard about the biopsies in 3 weeks.    SIGNATURES/CONFIDENTIALITY: You and/or your care partner have signed paperwork which will be entered into your electronic medical record.  These signatures attest to the fact that that the information above on your After Visit Summary has been reviewed and is understood.  Full responsibility of the confidentiality of this discharge information lies with you and/or your care-partner.  Resume Coumadin tomorrow,resume remainder of medications. Information givn on polyps,diverticulosis and high fiber diet.

## 2016-07-17 NOTE — Op Note (Signed)
[facility] Patient Name: [name] Procedure Date: 07/17/2016 1:59 PM MRN: [id number] Endoscopist: Ladene Artist , MD Age: 65 Referring MD:  Date of Birth: 05/15/1951 Gender: Male Account #: [id number] Procedure:                Colonoscopy Indications:              Surveillance: Personal history of adenomatous                            polyps on last colonoscopy > 5 years ago Medicines:                Monitored Anesthesia Care Procedure:                Pre-Anesthesia Assessment:                           - Prior to the procedure, a History and Physical                            was performed, and patient medications and                            allergies were reviewed. The patient's tolerance of                            previous anesthesia was also reviewed. The risks                            and benefits of the procedure and the sedation                            options and risks were discussed with the patient.                            All questions were answered, and informed consent                            was obtained. Prior Anticoagulants: The patient has                            taken Coumadin (warfarin), last dose was 5 days                            prior to procedure. ASA Grade Assessment: III - A                            patient with severe systemic disease. After                            reviewing the risks and benefits, the patient was                            deemed in satisfactory condition to undergo the  procedure.                           After obtaining informed consent, the colonoscope                            was passed under direct vision. Throughout the                            procedure, the patient's blood pressure, pulse, and                            oxygen saturations were monitored continuously. The                            Model [id number]-[id number] scope was introduced                        through the anus and advanced to the the cecum,                            identified by appendiceal orifice and ileocecal                            valve. The ileocecal valve, appendiceal orifice,                            and rectum were photographed. The quality of the                            bowel preparation was good. The colonoscopy was                            performed without difficulty. The patient tolerated                            the procedure well. Scope In: 2:13:24 PM Scope Out: 2:27:26 PM Scope Withdrawal Time: 0 hours 12 minutes 6 seconds  Total Procedure Duration: 0 hours 14 minutes 2 seconds  Findings:                 A 7 mm polyp was found in the sigmoid colon. The                            polyp was sessile. The polyp was removed with a                            cold snare. Resection and retrieval were complete.                           Internal hemorrhoids were found during                            retroflexion. The hemorrhoids were small and Grade  I (internal hemorrhoids that do not prolapse).                           A 4 mm polyp was found in the transverse colon. The                            polyp was sessile. The polyp was removed with a                            cold biopsy forceps. Resection and retrieval were                            complete.                           A few small-mouthed diverticula were found in the                            sigmoid colon and descending colon. There was no                            evidence of diverticular bleeding.                           The exam was otherwise normal throughout the                            examined colon. Complications:            No immediate complications. Estimated blood loss:                            None. Estimated Blood Loss:     Estimated blood loss: none. Impression:               - One 7 mm polyp in the sigmoid colon,  removed with                            a cold snare. Resected and retrieved.                           - One 4 mm polyp in the transverse colon, removed                            with a cold biopsy forceps. Resected and retrieved.                           - Mild diverticulosis in the sigmoid colon and in                            the descending colon. There was no evidence of                            diverticular bleeding.                           -  Small, grade I internal hemorrhoids Recommendation:           - Repeat colonoscopy in 5 years for surveillance.                           - Resume Coumadin (warfarin) tomorrow at prior                            dose. Refer to managing physician for further                            adjustment of therapy.                           - Patient has a contact number available for                            emergencies. The signs and symptoms of potential                            delayed complications were discussed with the                            patient. Return to normal activities tomorrow.                            Written discharge instructions were provided to the                            patient.                           - High fiber diet.                           - Continue present medications.                           - Await pathology results. Ladene Artist, MD 07/17/2016 2:32:43 PM This report has been signed electronically.

## 2016-07-17 NOTE — Progress Notes (Signed)
Called to room to assist during endoscopic procedure.  Patient ID and intended procedure confirmed with present staff. Received instructions for my participation in the procedure from the performing physician.  

## 2016-07-17 NOTE — Progress Notes (Addendum)
No egg or soy allergy known to patient  No issues with past sedation with any surgeries  or procedures, no intubation problems  No diet pills per patient No home 02 use per patient   blood thinners per patient  Pt takes coumadin- last dose 7-13 Thursday  Pt denies issues with constipation   Last dose plavix was 7-13 Thursday- held since Friday 7-14 as instructed

## 2016-07-18 ENCOUNTER — Telehealth: Payer: Self-pay | Admitting: *Deleted

## 2016-07-18 NOTE — Telephone Encounter (Signed)
  Follow up Call-  Call back number 07/17/2016  Post procedure Call Back phone  # [phone]  Permission to leave phone message Yes     Patient questions:  Do you have a fever, pain , or abdominal swelling? No. Pain Score  0 *  Have you tolerated food without any problems? Yes.    Have you been able to return to your normal activities? Yes.    Do you have any questions about your discharge instructions: Diet   No. Medications  No. Follow up visit  No.  Do you have questions or concerns about your Care? No.  Actions: * If pain score is 4 or above: No action needed, pain <4.

## 2016-07-24 ENCOUNTER — Encounter: Payer: Self-pay | Admitting: Gastroenterology

## 2016-07-25 ENCOUNTER — Encounter: Payer: Self-pay | Admitting: Gastroenterology

## 2016-07-30 DIAGNOSIS — J45909 Unspecified asthma, uncomplicated: Secondary | ICD-10-CM | POA: Diagnosis not present

## 2016-07-30 DIAGNOSIS — Z7901 Long term (current) use of anticoagulants: Secondary | ICD-10-CM | POA: Diagnosis not present

## 2016-07-30 DIAGNOSIS — J069 Acute upper respiratory infection, unspecified: Secondary | ICD-10-CM | POA: Diagnosis not present

## 2016-07-30 DIAGNOSIS — Z1389 Encounter for screening for other disorder: Secondary | ICD-10-CM | POA: Diagnosis not present

## 2016-07-30 DIAGNOSIS — Z23 Encounter for immunization: Secondary | ICD-10-CM | POA: Diagnosis not present

## 2016-07-30 DIAGNOSIS — Z6830 Body mass index (BMI) 30.0-30.9, adult: Secondary | ICD-10-CM | POA: Diagnosis not present

## 2016-08-02 DIAGNOSIS — Z7901 Long term (current) use of anticoagulants: Secondary | ICD-10-CM | POA: Diagnosis not present

## 2016-08-08 DIAGNOSIS — Z7901 Long term (current) use of anticoagulants: Secondary | ICD-10-CM | POA: Diagnosis not present

## 2016-08-20 DIAGNOSIS — Z7901 Long term (current) use of anticoagulants: Secondary | ICD-10-CM | POA: Diagnosis not present

## 2016-08-22 DIAGNOSIS — Z7901 Long term (current) use of anticoagulants: Secondary | ICD-10-CM | POA: Diagnosis not present

## 2016-08-29 DIAGNOSIS — Z7901 Long term (current) use of anticoagulants: Secondary | ICD-10-CM | POA: Diagnosis not present

## 2016-09-10 DIAGNOSIS — Z6833 Body mass index (BMI) 33.0-33.9, adult: Secondary | ICD-10-CM | POA: Diagnosis not present

## 2016-09-10 DIAGNOSIS — Z7901 Long term (current) use of anticoagulants: Secondary | ICD-10-CM | POA: Diagnosis not present

## 2016-09-10 DIAGNOSIS — Z23 Encounter for immunization: Secondary | ICD-10-CM | POA: Diagnosis not present

## 2016-09-10 DIAGNOSIS — S50312A Abrasion of left elbow, initial encounter: Secondary | ICD-10-CM | POA: Diagnosis not present

## 2016-09-10 DIAGNOSIS — S80212A Abrasion, left knee, initial encounter: Secondary | ICD-10-CM | POA: Diagnosis not present

## 2016-09-10 DIAGNOSIS — S2341XA Sprain of ribs, initial encounter: Secondary | ICD-10-CM | POA: Diagnosis not present

## 2016-09-11 DIAGNOSIS — E559 Vitamin D deficiency, unspecified: Secondary | ICD-10-CM | POA: Diagnosis not present

## 2016-09-16 ENCOUNTER — Ambulatory Visit (HOSPITAL_COMMUNITY)
Admission: RE | Admit: 2016-09-16 | Discharge: 2016-09-16 | Disposition: A | Discharge status: Home / Self Care | Payer: Medicare Other | Source: Ambulatory Visit | Attending: Rheumatology | Admitting: Rheumatology

## 2016-09-16 ENCOUNTER — Other Ambulatory Visit (HOSPITAL_COMMUNITY): Payer: Self-pay | Admitting: Rheumatology

## 2016-09-16 DIAGNOSIS — Z9181 History of falling: Secondary | ICD-10-CM | POA: Insufficient documentation

## 2016-09-16 DIAGNOSIS — M79672 Pain in left foot: Secondary | ICD-10-CM | POA: Diagnosis not present

## 2016-09-16 DIAGNOSIS — M79671 Pain in right foot: Secondary | ICD-10-CM | POA: Diagnosis not present

## 2016-09-16 DIAGNOSIS — M79641 Pain in right hand: Secondary | ICD-10-CM | POA: Diagnosis not present

## 2016-09-16 DIAGNOSIS — R0781 Pleurodynia: Secondary | ICD-10-CM | POA: Insufficient documentation

## 2016-09-16 DIAGNOSIS — M79642 Pain in left hand: Secondary | ICD-10-CM | POA: Diagnosis not present

## 2016-09-16 DIAGNOSIS — Z79899 Other long term (current) drug therapy: Secondary | ICD-10-CM | POA: Diagnosis not present

## 2016-09-16 DIAGNOSIS — R52 Pain, unspecified: Secondary | ICD-10-CM

## 2016-09-16 DIAGNOSIS — R079 Chest pain, unspecified: Secondary | ICD-10-CM | POA: Diagnosis not present

## 2016-09-25 DIAGNOSIS — Z7901 Long term (current) use of anticoagulants: Secondary | ICD-10-CM | POA: Diagnosis not present

## 2016-09-25 DIAGNOSIS — C61 Malignant neoplasm of prostate: Secondary | ICD-10-CM | POA: Diagnosis not present

## 2016-09-26 DIAGNOSIS — Z7901 Long term (current) use of anticoagulants: Secondary | ICD-10-CM | POA: Diagnosis not present

## 2016-09-27 DIAGNOSIS — Z7901 Long term (current) use of anticoagulants: Secondary | ICD-10-CM | POA: Diagnosis not present

## 2016-09-30 DIAGNOSIS — Z7901 Long term (current) use of anticoagulants: Secondary | ICD-10-CM | POA: Diagnosis not present

## 2016-10-02 ENCOUNTER — Ambulatory Visit (INDEPENDENT_AMBULATORY_CARE_PROVIDER_SITE_OTHER): Payer: Medicare Other | Admitting: Urology

## 2016-10-02 DIAGNOSIS — R9721 Rising PSA following treatment for malignant neoplasm of prostate: Secondary | ICD-10-CM | POA: Diagnosis not present

## 2016-10-02 DIAGNOSIS — C61 Malignant neoplasm of prostate: Secondary | ICD-10-CM

## 2016-10-05 ENCOUNTER — Other Ambulatory Visit: Payer: Self-pay | Admitting: Radiology

## 2016-10-05 DIAGNOSIS — Z79899 Other long term (current) drug therapy: Secondary | ICD-10-CM

## 2016-10-09 ENCOUNTER — Ambulatory Visit
Admission: RE | Admit: 2016-10-09 | Discharge: 2016-10-09 | Disposition: A | Discharge status: Home / Self Care | Payer: Medicare Other | Source: Ambulatory Visit | Attending: Radiation Oncology | Admitting: Radiation Oncology

## 2016-10-09 ENCOUNTER — Encounter: Payer: Self-pay | Admitting: Radiation Oncology

## 2016-10-09 VITALS — BP 142/73 | HR 77 | Resp 18 | Ht 69.0 in | Wt 231.0 lb

## 2016-10-09 DIAGNOSIS — R972 Elevated prostate specific antigen [PSA]: Secondary | ICD-10-CM | POA: Diagnosis not present

## 2016-10-09 DIAGNOSIS — C61 Malignant neoplasm of prostate: Secondary | ICD-10-CM | POA: Insufficient documentation

## 2016-10-09 DIAGNOSIS — Z888 Allergy status to other drugs, medicaments and biological substances status: Secondary | ICD-10-CM | POA: Diagnosis not present

## 2016-10-09 DIAGNOSIS — Z881 Allergy status to other antibiotic agents status: Secondary | ICD-10-CM | POA: Diagnosis not present

## 2016-10-09 DIAGNOSIS — Z882 Allergy status to sulfonamides status: Secondary | ICD-10-CM | POA: Diagnosis not present

## 2016-10-09 DIAGNOSIS — Z510 Encounter for antineoplastic radiation therapy: Secondary | ICD-10-CM | POA: Diagnosis not present

## 2016-10-09 DIAGNOSIS — Z7902 Long term (current) use of antithrombotics/antiplatelets: Secondary | ICD-10-CM | POA: Diagnosis not present

## 2016-10-09 DIAGNOSIS — Z9889 Other specified postprocedural states: Secondary | ICD-10-CM | POA: Diagnosis not present

## 2016-10-09 DIAGNOSIS — Z9079 Acquired absence of other genital organ(s): Secondary | ICD-10-CM | POA: Diagnosis not present

## 2016-10-09 NOTE — Progress Notes (Addendum)
GU Location of Tumor / Histology: biochemical reoccurrence of prostatic adenocarcinoma  If Prostate Cancer, Gleason Score is (3 + 4) and PSA at time of diagnosis was 6.0, s/p surgery PSA was 0.03 then, 1 year later PSA was 0.1.  [name] was diagnosed with prostate cancer 08/31/2015    Past/Anticipated interventions by urology, if any: prostate biopsy, RRP, referral to radiation oncology for salvage xrt  Past/Anticipated interventions by medical oncology, if any: no  Weight changes, if any: no  Bowel/Bladder complaints, if any: IPSS 3 with nocturia. ED confirmed with SHIM questionnaire. Denies dysuria, hematuria, leakage or incontinence. Reports a long history of constipation.    Nausea/Vomiting, if any: no  Pain issues, if any:  no  SAFETY ISSUES:  Prior radiation? no   Pacemaker/ICD? no  Possible current pregnancy? no  Is the patient on methotrexate? 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 Originally consulted by Dr. Tammi Klippel on 07/05/15. Reports a persistent dry cough and chest heaviness when laying flat. Reports persistent nausea.

## 2016-10-09 NOTE — Progress Notes (Signed)
Radiation Oncology         (336) [phone] ________________________________  Name: [name] MRN: [id number]  Date: 10/09/2016  DOB: 08/27/1951  Follow-Up Visit Note  CC: Purvis Kilts, MD  [name], MD  Diagnosis:   65 yo man with detectable rising PSA of 0.1 s/p radical prostatectomy with no adverse pathology features.    ICD-9-CM ICD-10-CM   1. Prostate cancer ([place]) 185 C61   2. Malignant neoplasm of prostate ([facility]) 185 C61     Interval Since Last Radiation:  N/A  Narrative:  The patient returns today for re-evaluation.  He had prostatectomy 08/31/2015 with no nodal involvement or other adverse pathology                                 Post-op PSA has been detectable recently rising to 0.1.  s/p surgery PSA it was 0.03 then, 1 year later PSA was 0.1.    ALLERGIES:  is allergic to levofloxacin; orencia [abatacept]; carbamazepine; celecoxib; cephalexin; enbrel [etanercept]; humira [adalimumab]; and sulfa antibiotics.  Meds: Current Outpatient Prescriptions  Medication Sig Dispense Refill  . albuterol (PROVENTIL) 2 MG tablet TAKE 1 TABLET BY MOUTH TWO TIMES DAILY AS NEEDED  1  . allopurinol (ZYLOPRIM) 100 MG tablet Take 200 mg by mouth at bedtime.     . dicyclomine (BENTYL) 10 MG capsule Take 4 capsules (40 mg total) by mouth 4 (four) times daily -  before meals and at bedtime. 120 capsule 5  . docusate sodium (COLACE) 100 MG capsule Take 100 mg by mouth 2 (two) times daily.    . folic acid (FOLVITE) 1 MG tablet Take 2 mg by mouth every morning.  4  . furosemide (LASIX) 40 MG tablet Take 1 tablet (40 mg total) by mouth 2 (two) times daily. 60 tablet 11  . Methotrexate Sodium (METHOTREXATE, PF,) 50 MG/2ML injection INJECT 0.6 ML WEEKLY FOR TWO WEEKS THEN 0.8 ML THEREAFTER  0  . mupirocin ointment (BACTROBAN) 2 % APPLY AS DIRECTED TO AFFECTED AREA(S) THREE TIMES A DAY  0  . omeprazole (PRILOSEC) 40 MG capsule Take 40 mg by mouth daily.  2  . phenytoin  (DILANTIN) 100 MG ER capsule Take 100-200 mg by mouth 2 (two) times daily. Take one capsule in the morning and 2 capsules at bedtime    . warfarin (COUMADIN) 3 MG tablet Take 3 mg by mouth at bedtime.     . ALPRAZolam (XANAX) 0.25 MG tablet Take 0.25 mg by mouth 3 (three) times daily as needed.  0   No current facility-administered medications for this encounter.     Physical Findings: The patient is in no acute distress. Patient is alert and oriented.  height is 5\' 9"  (1.753 m) and weight is 231 lb (104.8 kg). His blood pressure is 142/73 (abnormal) and his pulse is 77. His respiration is 18 and oxygen saturation is 98%. .  No significant changes.  Lab Findings: Lab Results  Component Value Date   WBC 11.5 (H) 09/01/2015   HGB 11.9 (L) 09/01/2015   HCT 36.0 (L) 09/01/2015   PLT 140 (L) 09/01/2015    Lab Results  Component Value Date   NA 140 09/01/2015   K 3.5 09/01/2015   CO2 28 09/01/2015   GLUCOSE 104 (H) 09/01/2015   BUN 12 09/01/2015   CREATININE 1.11 09/01/2015   BILITOT 0.5 08/18/2015   ALKPHOS 102 08/18/2015  AST 26 08/18/2015   ALT 20 08/18/2015   PROT 7.2 08/18/2015   ALBUMIN 3.8 08/18/2015   CALCIUM 8.1 (L) 09/01/2015   ANIONGAP 7 09/01/2015    Radiographic Findings: Dg Chest 2 View  Result Date: 09/16/2016 CLINICAL DATA:  Left chest pain.  History of fall. EXAM: CHEST  2 VIEW COMPARISON:  PA and lateral chest 08/12/2016 and 03/08/2015. FINDINGS: Chronic elevation of the left hemidiaphragm is unchanged. There is mild left basilar atelectasis. Lungs are otherwise clear. No pneumothorax or pleural effusion. Heart size is normal. Aortic atherosclerosis noted. IMPRESSION: No acute disease. Electronically Signed   By: Inge Rise M.D.   On: 09/16/2016 13:01    Impression:  The patient has evidence of biochemical recurrence and may benefit from prostatic fossa radiotherapy.  Plan:  Today, I talked to the patient and family about the findings and work-up thus  far.  We discussed the natural history of biochemically recurrent and general treatment, highlighting the role of radiotherapy in the management.  We discussed the available radiation techniques, and focused on the details of logistics and delivery.  We reviewed the anticipated acute and late sequelae associated with radiation in this setting.  The patient was encouraged to ask questions that I answered to the best of my ability.   The patient would like to proceed with radiation and will be scheduled for CT simulation later this week on 10/13 at 11 am.  I spent time face to face with the patient and more than 50% of that time was spent in counseling and/or coordination of care.   _____________________________________  Sheral Apley. Tammi Klippel, M.D.

## 2016-10-09 NOTE — Progress Notes (Signed)
See progress note under physician encounter. 

## 2016-10-11 ENCOUNTER — Ambulatory Visit
Admission: RE | Admit: 2016-10-11 | Discharge: 2016-10-11 | Disposition: A | Discharge status: Home / Self Care | Payer: Medicare Other | Source: Ambulatory Visit | Attending: Radiation Oncology | Admitting: Radiation Oncology

## 2016-10-11 DIAGNOSIS — C61 Malignant neoplasm of prostate: Secondary | ICD-10-CM | POA: Diagnosis not present

## 2016-10-11 DIAGNOSIS — Z888 Allergy status to other drugs, medicaments and biological substances status: Secondary | ICD-10-CM | POA: Diagnosis not present

## 2016-10-11 DIAGNOSIS — Z882 Allergy status to sulfonamides status: Secondary | ICD-10-CM | POA: Diagnosis not present

## 2016-10-11 DIAGNOSIS — Z510 Encounter for antineoplastic radiation therapy: Secondary | ICD-10-CM | POA: Diagnosis not present

## 2016-10-11 DIAGNOSIS — Z7902 Long term (current) use of antithrombotics/antiplatelets: Secondary | ICD-10-CM | POA: Diagnosis not present

## 2016-10-11 DIAGNOSIS — Z881 Allergy status to other antibiotic agents status: Secondary | ICD-10-CM | POA: Diagnosis not present

## 2016-10-11 NOTE — Progress Notes (Signed)
  Radiation Oncology         (336) [phone] ________________________________  Name: BOLTON HOUSEHOLDER MRN: [id number]  Date: 10/11/2016  DOB: March 04, 1951  SIMULATION AND TREATMENT PLANNING NOTE    ICD-9-CM ICD-10-CM   1. Malignant neoplasm of prostate ([place]) 185 C61     DIAGNOSIS:  65 yo man with detectable rising PSA of 0.1 s/p radical prostatectomy with no adverse pathology features  NARRATIVE:  The patient was brought to the [facility].  Identity was confirmed.  All relevant records and images related to the planned course of therapy were reviewed.  The patient freely provided informed written consent to proceed with treatment after reviewing the details related to the planned course of therapy. The consent form was witnessed and verified by the simulation staff.  Then, the patient was set-up in a stable reproducible supine position for radiation therapy.  A vacuum lock pillow device was custom fabricated to position his legs in a reproducible immobilized position.  Then, I performed a urethrogram under sterile conditions to identify the prostatic apex.  CT images were obtained.  Surface markings were placed.  The CT images were loaded into the planning software.  Then the prostate target and avoidance structures including the rectum, bladder, bowel and hips were contoured.  Treatment planning then occurred.  The radiation prescription was entered and confirmed.  A total of one complex treatment devices were fabricated. I have requested : Intensity Modulated Radiotherapy (IMRT) is medically necessary for this case for the following reason:  Rectal sparing.[name]  PLAN:  The patient will receive 68.4 Gy in 38 fractions.  ________________________________  Sheral Apley Tammi Klippel, M.D.

## 2016-10-17 DIAGNOSIS — C61 Malignant neoplasm of prostate: Secondary | ICD-10-CM | POA: Diagnosis not present

## 2016-10-17 DIAGNOSIS — Z7902 Long term (current) use of antithrombotics/antiplatelets: Secondary | ICD-10-CM | POA: Diagnosis not present

## 2016-10-17 DIAGNOSIS — Z888 Allergy status to other drugs, medicaments and biological substances status: Secondary | ICD-10-CM | POA: Diagnosis not present

## 2016-10-17 DIAGNOSIS — Z882 Allergy status to sulfonamides status: Secondary | ICD-10-CM | POA: Diagnosis not present

## 2016-10-17 DIAGNOSIS — Z510 Encounter for antineoplastic radiation therapy: Secondary | ICD-10-CM | POA: Diagnosis not present

## 2016-10-17 DIAGNOSIS — Z881 Allergy status to other antibiotic agents status: Secondary | ICD-10-CM | POA: Diagnosis not present

## 2016-10-21 DIAGNOSIS — Z510 Encounter for antineoplastic radiation therapy: Secondary | ICD-10-CM | POA: Diagnosis not present

## 2016-10-21 DIAGNOSIS — Z882 Allergy status to sulfonamides status: Secondary | ICD-10-CM | POA: Diagnosis not present

## 2016-10-21 DIAGNOSIS — Z7902 Long term (current) use of antithrombotics/antiplatelets: Secondary | ICD-10-CM | POA: Diagnosis not present

## 2016-10-21 DIAGNOSIS — Z888 Allergy status to other drugs, medicaments and biological substances status: Secondary | ICD-10-CM | POA: Diagnosis not present

## 2016-10-21 DIAGNOSIS — C61 Malignant neoplasm of prostate: Secondary | ICD-10-CM | POA: Diagnosis not present

## 2016-10-21 DIAGNOSIS — Z881 Allergy status to other antibiotic agents status: Secondary | ICD-10-CM | POA: Diagnosis not present

## 2016-10-22 ENCOUNTER — Ambulatory Visit
Admission: RE | Admit: 2016-10-22 | Discharge: 2016-10-22 | Disposition: A | Discharge status: Home / Self Care | Payer: Medicare Other | Source: Ambulatory Visit | Attending: Radiation Oncology | Admitting: Radiation Oncology

## 2016-10-22 DIAGNOSIS — Z881 Allergy status to other antibiotic agents status: Secondary | ICD-10-CM | POA: Diagnosis not present

## 2016-10-22 DIAGNOSIS — Z882 Allergy status to sulfonamides status: Secondary | ICD-10-CM | POA: Diagnosis not present

## 2016-10-22 DIAGNOSIS — Z510 Encounter for antineoplastic radiation therapy: Secondary | ICD-10-CM | POA: Diagnosis not present

## 2016-10-22 DIAGNOSIS — Z888 Allergy status to other drugs, medicaments and biological substances status: Secondary | ICD-10-CM | POA: Diagnosis not present

## 2016-10-22 DIAGNOSIS — C61 Malignant neoplasm of prostate: Secondary | ICD-10-CM | POA: Diagnosis not present

## 2016-10-22 DIAGNOSIS — Z7902 Long term (current) use of antithrombotics/antiplatelets: Secondary | ICD-10-CM | POA: Diagnosis not present

## 2016-10-23 ENCOUNTER — Ambulatory Visit
Admission: RE | Admit: 2016-10-23 | Discharge: 2016-10-23 | Disposition: A | Discharge status: Home / Self Care | Payer: Medicare Other | Source: Ambulatory Visit | Attending: Radiation Oncology | Admitting: Radiation Oncology

## 2016-10-23 ENCOUNTER — Telehealth: Payer: Self-pay | Admitting: Rheumatology

## 2016-10-23 DIAGNOSIS — C61 Malignant neoplasm of prostate: Secondary | ICD-10-CM | POA: Diagnosis not present

## 2016-10-23 DIAGNOSIS — Z510 Encounter for antineoplastic radiation therapy: Secondary | ICD-10-CM | POA: Diagnosis not present

## 2016-10-23 DIAGNOSIS — Z7902 Long term (current) use of antithrombotics/antiplatelets: Secondary | ICD-10-CM | POA: Diagnosis not present

## 2016-10-23 DIAGNOSIS — Z882 Allergy status to sulfonamides status: Secondary | ICD-10-CM | POA: Diagnosis not present

## 2016-10-23 DIAGNOSIS — Z881 Allergy status to other antibiotic agents status: Secondary | ICD-10-CM | POA: Diagnosis not present

## 2016-10-23 DIAGNOSIS — Z888 Allergy status to other drugs, medicaments and biological substances status: Secondary | ICD-10-CM | POA: Diagnosis not present

## 2016-10-23 NOTE — Telephone Encounter (Signed)
I have called patient to advise, and have encouraged him to discuss with the Urologist. He will discuss, he will also try taking prednisone one in am and one in the pm and see if this is more helpful

## 2016-10-23 NOTE — Telephone Encounter (Signed)
Pt should discuss with urologist if he can take any DMARD's or increase the dose of prednisone.

## 2016-10-23 NOTE — Telephone Encounter (Signed)
Patient on prednisone 10mg  daily. His urologist does not want him to use Orencia, MTX, or Arava, he is having pain with his RA, what should I advise him ?

## 2016-10-23 NOTE — Telephone Encounter (Signed)
Patient states the Prednisone is not controlling the pain/swelling, cb# [phone]

## 2016-10-24 ENCOUNTER — Ambulatory Visit
Admission: RE | Admit: 2016-10-24 | Discharge: 2016-10-24 | Disposition: A | Discharge status: Home / Self Care | Payer: Medicare Other | Source: Ambulatory Visit | Attending: Radiation Oncology | Admitting: Radiation Oncology

## 2016-10-24 DIAGNOSIS — Z882 Allergy status to sulfonamides status: Secondary | ICD-10-CM | POA: Diagnosis not present

## 2016-10-24 DIAGNOSIS — Z888 Allergy status to other drugs, medicaments and biological substances status: Secondary | ICD-10-CM | POA: Diagnosis not present

## 2016-10-24 DIAGNOSIS — Z7902 Long term (current) use of antithrombotics/antiplatelets: Secondary | ICD-10-CM | POA: Diagnosis not present

## 2016-10-24 DIAGNOSIS — Z881 Allergy status to other antibiotic agents status: Secondary | ICD-10-CM | POA: Diagnosis not present

## 2016-10-24 DIAGNOSIS — Z510 Encounter for antineoplastic radiation therapy: Secondary | ICD-10-CM | POA: Diagnosis not present

## 2016-10-24 DIAGNOSIS — C61 Malignant neoplasm of prostate: Secondary | ICD-10-CM | POA: Diagnosis not present

## 2016-10-25 ENCOUNTER — Ambulatory Visit
Admission: RE | Admit: 2016-10-25 | Discharge: 2016-10-25 | Disposition: A | Discharge status: Home / Self Care | Payer: Medicare Other | Source: Ambulatory Visit | Attending: Radiation Oncology | Admitting: Radiation Oncology

## 2016-10-25 ENCOUNTER — Encounter: Payer: Self-pay | Admitting: Radiation Oncology

## 2016-10-25 VITALS — BP 135/100 | HR 79 | Temp 98.3°F | Resp 20 | Ht 69.0 in | Wt 226.6 lb

## 2016-10-25 DIAGNOSIS — Z881 Allergy status to other antibiotic agents status: Secondary | ICD-10-CM | POA: Diagnosis not present

## 2016-10-25 DIAGNOSIS — C61 Malignant neoplasm of prostate: Secondary | ICD-10-CM | POA: Diagnosis not present

## 2016-10-25 DIAGNOSIS — Z888 Allergy status to other drugs, medicaments and biological substances status: Secondary | ICD-10-CM | POA: Diagnosis not present

## 2016-10-25 DIAGNOSIS — Z510 Encounter for antineoplastic radiation therapy: Secondary | ICD-10-CM | POA: Diagnosis not present

## 2016-10-25 DIAGNOSIS — Z7902 Long term (current) use of antithrombotics/antiplatelets: Secondary | ICD-10-CM | POA: Diagnosis not present

## 2016-10-25 DIAGNOSIS — Z882 Allergy status to sulfonamides status: Secondary | ICD-10-CM | POA: Diagnosis not present

## 2016-10-25 NOTE — Progress Notes (Signed)
Weight and vital signs stable.  Pt here for patient teaching.  Reviewed areas of pertinence such as diarrhea, fatigue, nausea and vomiting, skin changes and urinary and bladder changes . skin, use unscented/gentle soap, use baby wipes, have Imodium on hand, drink plenty of water and sitz bath.  Complains of nausea,had loose stool "stated he had increased his stool softner"  asked not to take stool softner per instructions.  Noctuira x 4 takes Lasix at night.  No dysuria or hematuria. Feels that he is emptying his bladder and has a strong urinary stream.  See education area for documentation. Wt Readings from Last 3 Encounters:  10/25/16 226 lb 9.6 oz (102.8 kg)  10/09/16 231 lb (104.8 kg)  07/17/16 226 lb (102.5 kg)  BP (!) 135/100   Pulse 79   Temp 98.3 F (36.8 C) (Oral)   Resp 20   Ht 5\' 9"  (1.753 m)   Wt 226 lb 9.6 oz (102.8 kg)   SpO2 98%   BMI 33.46 kg/m

## 2016-10-25 NOTE — Progress Notes (Signed)
  Radiation Oncology         [phone]   Name: [name] MRN: [id number]   Date: 10/25/2016  DOB: September 29, 1951   Weekly Radiation Therapy Management    ICD-9-CM ICD-10-CM   1. Malignant neoplasm of prostate (HCC) 185 C61     Current Dose: 7.2 Gy  Planned Dose:  68.46 Gy  Narrative The patient presents for routine under treatment assessment.  Weight and vitals stable. Patient has completed 4/38 treatments. The patient complains of nausea and a recent loose stool, reporting he had "increased his stool softener." He reports nocturia x 4, and takes Lasix at night. He denies dysuria and hematuria. The patient reports that he feels he is emptying his bladder and has a strong urine stream. Patient complains of pain from arthritis in "feet, both knees, shoulders and hands," and questions what his options are for managing discomfort.  The patient is without complaint. Set-up films were reviewed. The chart was checked.  Physical Findings  height is 5\' 9"  (1.753 m) and weight is 226 lb 9.6 oz (102.8 kg). His oral temperature is 98.3 F (36.8 C). His blood pressure is 135/100 (abnormal) and his pulse is 79. His respiration is 20 and oxygen saturation is 98%.  Weight essentially stable.  No significant changes.  Impression The patient is tolerating radiation.  Plan Continue treatment as planned. I recommend OTC Tylenol or Advil for arthritic pain; it is also okay to take his Xanax prescription if needed.        Sheral Apley Tammi Klippel, M.D. This document serves as a record of services personally performed by Tyler Pita, MD. It was created on his behalf by Maryla Morrow, a trained medical scribe. The creation of this record is based on the scribe's personal observations and the provider's statements to them. This document has been checked and approved by the attending provider.

## 2016-10-28 ENCOUNTER — Ambulatory Visit
Admission: RE | Admit: 2016-10-28 | Discharge: 2016-10-28 | Disposition: A | Discharge status: Home / Self Care | Payer: Medicare Other | Source: Ambulatory Visit | Attending: Radiation Oncology | Admitting: Radiation Oncology

## 2016-10-28 ENCOUNTER — Telehealth: Payer: Self-pay | Admitting: Radiology

## 2016-10-28 ENCOUNTER — Telehealth: Payer: Self-pay | Admitting: Rheumatology

## 2016-10-28 DIAGNOSIS — Z7902 Long term (current) use of antithrombotics/antiplatelets: Secondary | ICD-10-CM | POA: Diagnosis not present

## 2016-10-28 DIAGNOSIS — C61 Malignant neoplasm of prostate: Secondary | ICD-10-CM | POA: Diagnosis not present

## 2016-10-28 DIAGNOSIS — Z882 Allergy status to sulfonamides status: Secondary | ICD-10-CM | POA: Diagnosis not present

## 2016-10-28 DIAGNOSIS — Z881 Allergy status to other antibiotic agents status: Secondary | ICD-10-CM | POA: Diagnosis not present

## 2016-10-28 DIAGNOSIS — Z7901 Long term (current) use of anticoagulants: Secondary | ICD-10-CM | POA: Diagnosis not present

## 2016-10-28 DIAGNOSIS — Z888 Allergy status to other drugs, medicaments and biological substances status: Secondary | ICD-10-CM | POA: Diagnosis not present

## 2016-10-28 DIAGNOSIS — Z510 Encounter for antineoplastic radiation therapy: Secondary | ICD-10-CM | POA: Diagnosis not present

## 2016-10-28 NOTE — Telephone Encounter (Signed)
Called patient to advise / see previous message

## 2016-10-28 NOTE — Telephone Encounter (Signed)
Patient called stating he was returning Amy's phone call about his arthritis.

## 2016-10-28 NOTE — Telephone Encounter (Signed)
Patient called, he is having Jaw pain, states he would like an injection, I have told him likely his Dentist would need to do this, he does not have a dentist. Do you have any suggestions ? He is on prednisone 10mg  currently

## 2016-10-28 NOTE — Telephone Encounter (Signed)
I would recommend dentist visit. I do not have a specific dentist to recommend to.

## 2016-10-28 NOTE — Telephone Encounter (Signed)
Left message for him to advise

## 2016-10-29 ENCOUNTER — Encounter: Payer: Self-pay | Admitting: Radiation Oncology

## 2016-10-29 ENCOUNTER — Telehealth: Payer: Self-pay | Admitting: Rheumatology

## 2016-10-29 ENCOUNTER — Ambulatory Visit
Admission: RE | Admit: 2016-10-29 | Discharge: 2016-10-29 | Disposition: A | Discharge status: Home / Self Care | Payer: Medicare Other | Source: Ambulatory Visit | Attending: Radiation Oncology | Admitting: Radiation Oncology

## 2016-10-29 VITALS — BP 140/98 | HR 80 | Temp 98.3°F | Resp 18 | Ht 69.0 in | Wt 229.2 lb

## 2016-10-29 DIAGNOSIS — C61 Malignant neoplasm of prostate: Secondary | ICD-10-CM | POA: Diagnosis not present

## 2016-10-29 DIAGNOSIS — Z510 Encounter for antineoplastic radiation therapy: Secondary | ICD-10-CM | POA: Diagnosis not present

## 2016-10-29 DIAGNOSIS — Z7902 Long term (current) use of antithrombotics/antiplatelets: Secondary | ICD-10-CM | POA: Diagnosis not present

## 2016-10-29 DIAGNOSIS — Z881 Allergy status to other antibiotic agents status: Secondary | ICD-10-CM | POA: Diagnosis not present

## 2016-10-29 DIAGNOSIS — Z888 Allergy status to other drugs, medicaments and biological substances status: Secondary | ICD-10-CM | POA: Diagnosis not present

## 2016-10-29 DIAGNOSIS — R110 Nausea: Secondary | ICD-10-CM

## 2016-10-29 DIAGNOSIS — Z882 Allergy status to sulfonamides status: Secondary | ICD-10-CM | POA: Diagnosis not present

## 2016-10-29 MED ORDER — HYDROCODONE-ACETAMINOPHEN 5-325 MG PO TABS: 1.0000 | ORAL_TABLET | Freq: Four times a day (QID) | ORAL | 0 refills | Status: DC | PRN

## 2016-10-29 MED ORDER — ONDANSETRON HCL 4 MG PO TABS: 4.0000 mg | ORAL_TABLET | Freq: Three times a day (TID) | ORAL | 0 refills | Status: DC | PRN

## 2016-10-29 NOTE — Progress Notes (Addendum)
Weight and vital signs stable.  Denies diarrhea c/o rectum feeling sensitive from sitting, fatigue, skin changes and urinary and bladder changes  or dysuria or hematuria.  Feels that he is emptying his bladder and has a strong urinary stream.   Nausea since last week.  Takes Last at night up to bathroom 3-4 times.  Pain in low back and right jaw 5/10. Does not have any pain medication.  Appetite is fair. Wt Readings from Last 3 Encounters:  10/29/16 229 lb 3.2 oz (104 kg)  10/25/16 226 lb 9.6 oz (102.8 kg)  10/09/16 231 lb (104.8 kg)  BP (!) 140/98   Pulse 80   Temp 98.3 F (36.8 C) (Oral)   Resp 18   Ht 5\' 9"  (1.753 m)   Wt 229 lb 3.2 oz (104 kg)   SpO2 98%   BMI 33.85 kg/m

## 2016-10-29 NOTE — Progress Notes (Signed)
  Radiation Oncology         [phone]   Name: [name] MRN: [id number]   Date: 10/29/2016  DOB: 1951/08/05   Weekly Radiation Therapy Management    ICD-9-CM ICD-10-CM   1. Malignant neoplasm of prostate (HCC) 185 C61     Current Dose: 10.8 Gy  Planned Dose:  68.4 Gy  Narrative The patient presents for routine under treatment assessment. Weight and vital signs stable.  Denies diarrhea c/o rectum feeling sensitive from sitting, fatigue, skin changes and urinary and bladder changes  or dysuria or hematuria.  Feels that he is emptying his bladder and has a strong urinary stream.   Nausea since last week.  Takes Last at night up to bathroom 3-4 times.  Pain in low back and right jaw 5/10. Does not have any pain medication.  Appetite is fair.  The patient is without complaint. Set-up films were reviewed. The chart was checked.  Physical Findings  height is 5\' 9"  (1.753 m) and weight is 229 lb 3.2 oz (104 kg). His oral temperature is 98.3 F (36.8 C). His blood pressure is 140/98 (abnormal) and his pulse is 80. His respiration is 18 and oxygen saturation is 98%. . Weight essentially stable.  No significant changes.  Impression The patient is tolerating radiation.  Plan Continue treatment as planned.         Sheral Apley Tammi Klippel, M.D.

## 2016-10-30 ENCOUNTER — Ambulatory Visit
Admission: RE | Admit: 2016-10-30 | Discharge: 2016-10-30 | Disposition: A | Discharge status: Home / Self Care | Payer: Medicare Other | Source: Ambulatory Visit | Attending: Radiation Oncology | Admitting: Radiation Oncology

## 2016-10-30 ENCOUNTER — Telehealth: Payer: Self-pay | Admitting: Rheumatology

## 2016-10-30 DIAGNOSIS — C61 Malignant neoplasm of prostate: Secondary | ICD-10-CM | POA: Diagnosis not present

## 2016-10-30 DIAGNOSIS — G40309 Generalized idiopathic epilepsy and epileptic syndromes, not intractable, without status epilepticus: Secondary | ICD-10-CM | POA: Diagnosis not present

## 2016-10-30 DIAGNOSIS — M069 Rheumatoid arthritis, unspecified: Secondary | ICD-10-CM | POA: Diagnosis not present

## 2016-10-30 DIAGNOSIS — Z510 Encounter for antineoplastic radiation therapy: Secondary | ICD-10-CM | POA: Diagnosis not present

## 2016-10-30 DIAGNOSIS — Z6833 Body mass index (BMI) 33.0-33.9, adult: Secondary | ICD-10-CM | POA: Diagnosis not present

## 2016-10-30 DIAGNOSIS — Z1389 Encounter for screening for other disorder: Secondary | ICD-10-CM | POA: Diagnosis not present

## 2016-10-31 ENCOUNTER — Ambulatory Visit (INDEPENDENT_AMBULATORY_CARE_PROVIDER_SITE_OTHER): Payer: Medicare Other | Admitting: Rheumatology

## 2016-10-31 ENCOUNTER — Telehealth: Payer: Self-pay | Admitting: Radiation Oncology

## 2016-10-31 ENCOUNTER — Telehealth: Payer: Self-pay | Admitting: Radiology

## 2016-10-31 ENCOUNTER — Encounter: Payer: Self-pay | Admitting: Radiation Oncology

## 2016-10-31 ENCOUNTER — Ambulatory Visit
Admission: RE | Admit: 2016-10-31 | Discharge: 2016-10-31 | Disposition: A | Discharge status: Home / Self Care | Payer: Medicare Other | Source: Ambulatory Visit | Attending: Radiation Oncology | Admitting: Radiation Oncology

## 2016-10-31 ENCOUNTER — Encounter: Payer: Self-pay | Admitting: Rheumatology

## 2016-10-31 VITALS — BP 124/67 | HR 78 | Resp 16 | Ht 69.0 in | Wt 227.0 lb

## 2016-10-31 DIAGNOSIS — M0579 Rheumatoid arthritis with rheumatoid factor of multiple sites without organ or systems involvement: Secondary | ICD-10-CM | POA: Diagnosis not present

## 2016-10-31 DIAGNOSIS — J449 Chronic obstructive pulmonary disease, unspecified: Secondary | ICD-10-CM | POA: Diagnosis not present

## 2016-10-31 DIAGNOSIS — L409 Psoriasis, unspecified: Secondary | ICD-10-CM | POA: Diagnosis not present

## 2016-10-31 DIAGNOSIS — M19042 Primary osteoarthritis, left hand: Secondary | ICD-10-CM | POA: Diagnosis not present

## 2016-10-31 DIAGNOSIS — I509 Heart failure, unspecified: Secondary | ICD-10-CM | POA: Diagnosis not present

## 2016-10-31 DIAGNOSIS — M19041 Primary osteoarthritis, right hand: Secondary | ICD-10-CM

## 2016-10-31 DIAGNOSIS — M170 Bilateral primary osteoarthritis of knee: Secondary | ICD-10-CM | POA: Diagnosis not present

## 2016-10-31 DIAGNOSIS — M1A9XX0 Chronic gout, unspecified, without tophus (tophi): Secondary | ICD-10-CM

## 2016-10-31 DIAGNOSIS — Z79899 Other long term (current) drug therapy: Secondary | ICD-10-CM

## 2016-10-31 DIAGNOSIS — I82409 Acute embolism and thrombosis of unspecified deep veins of unspecified lower extremity: Secondary | ICD-10-CM | POA: Insufficient documentation

## 2016-10-31 DIAGNOSIS — M19071 Primary osteoarthritis, right ankle and foot: Secondary | ICD-10-CM

## 2016-10-31 DIAGNOSIS — I824Y9 Acute embolism and thrombosis of unspecified deep veins of unspecified proximal lower extremity: Secondary | ICD-10-CM | POA: Diagnosis not present

## 2016-10-31 DIAGNOSIS — J441 Chronic obstructive pulmonary disease with (acute) exacerbation: Secondary | ICD-10-CM | POA: Insufficient documentation

## 2016-10-31 DIAGNOSIS — M19072 Primary osteoarthritis, left ankle and foot: Secondary | ICD-10-CM

## 2016-10-31 DIAGNOSIS — C61 Malignant neoplasm of prostate: Secondary | ICD-10-CM | POA: Diagnosis not present

## 2016-10-31 DIAGNOSIS — Z510 Encounter for antineoplastic radiation therapy: Secondary | ICD-10-CM | POA: Diagnosis not present

## 2016-10-31 MED ORDER — LEFLUNOMIDE 20 MG PO TABS: 20.0000 mg | ORAL_TABLET | Freq: Every day | ORAL | 2 refills | Status: DC

## 2016-10-31 MED ORDER — PREDNISONE 10 MG PO TABS: ORAL_TABLET | ORAL | 0 refills | Status: DC

## 2016-10-31 NOTE — Progress Notes (Addendum)
*IMAGE* Office Visit Note  Patient: [name]             Date of Birth: 05/05/1951           MRN: [id number]             PCP: Purvis Kilts, MD Referring: [name], MD Visit Date: 10/31/2016 Occupation: Retired Dealer    Subjective: Pain and swelling in multiple joints   Present illness : ISSAIH FLIPPEN is a 65 y.o. male with history of sero positive rheumatoid arthritis. He's been off methotrexate for 1-1/2 months now. He started radiation therapy for prostate cancer. He is on prednisone 10 mg a day which is not controlling his symptoms. he is having pain and swelling in multiple joints.  Activities of Daily Living:  Patient reports morning stiffness for all day hours.   Patient Reports nocturnal pain.  Difficulty dressing/grooming: Reports Difficulty climbing stairs: Reports Difficulty getting out of chair: Reports Difficulty using hands for taps, buttons, cutlery, and/or writing: Reports   Review of Systems  Constitutional: Positive for fatigue and weakness.  Musculoskeletal: Positive for arthralgias, gait problem, joint pain, joint swelling, morning stiffness and muscle tenderness.  Psychiatric/Behavioral: Positive for depressed mood and sleep disturbance. The patient is nervous/anxious.   All other systems reviewed and are negative.   PMFS History:  Patient Active Problem List   Diagnosis Date Noted  . DVT (deep venous thrombosis) ([place]) 10/31/2016  . COPD (chronic obstructive pulmonary disease) ([place]) 10/31/2016  . CHF (congestive heart failure) ([place]) 10/31/2016  . Prostate cancer ([place]) 08/30/2015  . Malignant neoplasm of prostate ([place]) 07/04/2015  . Chest pain at rest 07/05/2014  . Weakness 07/05/2014  . Complicated postphlebitic syndrome 11/16/2013  . Personal history of DVT (deep vein thrombosis) 05/18/2013  . Chronic anticoagulation 05/18/2013  . Diverticulosis of colon without hemorrhage 05/18/2013  . Rheumatoid arthritis(714.0) 05/18/2013   . Seizure disorder ([place]) 05/18/2013  . Hx of adenomatous colonic polyps 05/18/2013  . GERD 05/08/2010  . NAUSEA 05/08/2010  . FLATULENCE-GAS-BLOATING 05/08/2010    Past Medical History:  Diagnosis Date  . Anxiety    hx of   . Arthritis   . Asthma   . Clotting disorder (HCC)    DVT both legs   . COPD (chronic obstructive pulmonary disease) ([place])   . DDD (degenerative disc disease), cervical    with UE's paresthesias  . Diverticulosis   . DVT, lower extremity ([place])    bilat  . 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 GERD (gastroesophageal reflux disease)   . Gout   . Heart murmur   . History of measles   . History of shingles   . Hypercholesterolemia   . IBS (irritable bowel syndrome)   . IBS (irritable bowel syndrome)   . Peripheral edema   . Pneumonia    hx of   . Prostate cancer ([place])   . Seizures ([place])    last seizure 20 years ago   . Shortness of breath dyspnea    exertion   . Sleep apnea    not on cpap  . Tubular adenoma of colon 04/2008    Family History  Problem Relation Age of Onset  . Skin cancer Father   . Stomach cancer Father   . Heart attack Father   . Alzheimer's disease Mother   . Throat cancer Brother   . Heart attack Brother   .  Esophageal cancer Brother   . Heart attack Brother   . Throat cancer Brother   . Breast cancer Sister   . Heart attack Sister   . Colon cancer Neg Hx   . Colon polyps Neg Hx    Past Surgical History:  Procedure Laterality Date  . CHOLECYSTECTOMY    . COLONOSCOPY    . DOPPLER ECHOCARDIOGRAPHY N/A 01-21-2012   TECHNICALLY DIFFICULT. MILD CONCENTRIC LV HYPERTROPHY. LV CAVITY IS SMALL.. EF=> 55%. TRANSMITRAL SPECTRAL FLOW PATTREN IS SUGGESTIVE OF IMPAIRED LV RELAXATION. RV SYSTOLIC PRESSURE IS [id number]. LEFT ATRIAL SIZE IS NORMAL. AV APPEARS MILDLY SCLEROTIC. NO SIGN VALVE DISEASE NOTED.  [name] LYMPHADENECTOMY Bilateral 08/30/2015   Procedure: PELVIC  LYMPHADENECTOMY;  Surgeon: [name], MD;  Location: [facility];  Service: Urology;  Laterality: Bilateral;  . NUCLEAR STRESS TEST N/A 01-21-2012   NORMAL PATTERN OF PERFUSION IN ALL REGIONS. POST STRESS LV SIZE IS NORMAL. NO EVIDENCE OF INDUCIBLE ISCHEMIA. EF 54%.  [name] POLYPECTOMY    . PROSTATE BIOPSY    . ROBOT ASSISTED LAPAROSCOPIC RADICAL PROSTATECTOMY N/A 08/30/2015   Procedure: ROBOTIC ASSISTED LAPAROSCOPIC RADICAL PROSTATECTOMY;  Surgeon: [name], MD;  Location: [facility];  Service: Urology;  Laterality: N/A;  . US VENOUS LOWER EXT Right 03/07/11   PERSISTENT DVT IN RIGHT LOWER EXT.WITH PERSISTANT VISUALIZATION OF HYPOECHOIC THROMBUS WITHIN THE FEMORAL, PROFUNDA FEMORAL AND POPLITEAL VEINS. WHEN COMPARED TO PREVIOUS, CLOT IS NO LONGER IDENTIFIED WITH IN THE RIGHT CFV.   Social History   Social History Narrative  . No narrative on file     Objective: Vital Signs: BP 124/67 (BP Location: Left Arm, Patient Position: Sitting, Cuff Size: Large)   Pulse 78   Resp 16   Ht 5\' 9"  (1.753 m)   Wt 227 lb (103 kg)   BMI 33.52 kg/m    Physical Exam  Constitutional: He is oriented to person, place, and time. He appears well-developed and well-nourished.  HENT:  Head: Normocephalic and atraumatic.  Eyes: Conjunctivae and EOM are normal. Pupils are equal, round, and reactive to light.  Neck: Normal range of motion. Neck supple.  Cardiovascular: Normal rate, regular rhythm and normal heart sounds.   Pulmonary/Chest: Effort normal and breath sounds normal.  Abdominal: Soft. Bowel sounds are normal.  Neurological: He is alert and oriented to person, place, and time.  Skin: Skin is warm and dry.  Psychiatric: He has a normal mood and affect. His behavior is normal.  Nursing note and vitals reviewed.    Musculoskeletal Exam:    CDAI Exam: CDAI Homunculus Exam:   Tenderness:  RUE: glenohumeral, ulnohumeral and radiohumeral and wrist LUE: glenohumeral, ulnohumeral and  radiohumeral and wrist Right hand: 1st MCP, 2nd MCP, 3rd MCP, 4th MCP, 5th MCP, 2nd PIP, 3rd PIP, 4th PIP and 5th PIP Left hand: 1st MCP, 2nd MCP, 3rd MCP, 4th MCP, 5th MCP, 2nd PIP, 3rd PIP, 4th PIP and 5th PIP RLE: acetabulofemoral, tibiofemoral and tibiotalar LLE: acetabulofemoral, tibiofemoral and tibiotalar Right foot: 1st MTP, 2nd MTP, 3rd MTP, 4th MTP and 5th MTP Left foot: 1st MTP, 2nd MTP, 3rd MTP, 4th MTP and 5th MTP  Swelling:  RUE: wrist LUE: wrist Right hand: 1st MCP, 2nd MCP, 3rd MCP, 4th MCP, 5th MCP, 2nd PIP, 3rd PIP, 4th PIP and 5th PIP Left hand: 1st MCP, 2nd MCP, 3rd MCP, 4th MCP, 5th MCP, 2nd PIP, 3rd PIP, 4th PIP and 5th PIP RLE: tibiofemoral and tibiotalar LLE: tibiofemoral and tibiotalar Right foot: 1st MTP and  2nd MTP Left foot: 3rd MTP and 4th MTP  Joint Counts:  CDAI Tender Joint count: 26 CDAI Swollen Joint count: 22  Global Assessments:  Patient Global Assessment: 10 Provider Global Assessment: 10  CDAI Calculated Score: 68    Investigation: Findings:  September 2017 CBC, CMP were normal,TB 7/16    Imaging: No results found.  Speciality Comments: No specialty comments available.    Procedures:  No procedures performed Allergies: Levofloxacin; Orencia [abatacept]; Carbamazepine; Celecoxib; Cephalexin; Enbrel [etanercept]; Humira [adalimumab]; and Sulfa antibiotics   Assessment / Plan: Visit Diagnoses:  Rheumatoid arthritis with rheumatoid factor of multiple sites without organ or systems involvement ([place]) - +RF+CCP He is having severe flare of his rheumatoid arthritis says he's been off methotrexate for 1-1/2 month. Despite taking prednisone 10 mg a day he is having swelling in multiple joints. We had to take him off all the biologic DMARD's due to history of prostate cancer. He is receiving radiation therapy currently. I would like to start him on [place] if approved by his oncologist. We had detailed discussion regarding the medication.  Indications side effects contraindications were discussed at length. He'll be starting on 20 mg per day and nasal check his labs in 2 weeks, 4 weeks and then every 2 months. His blood pressure is normal today I will give him a prednisone taper to control his symptoms as he is having a severe flare.  High risk medication use: This methotrexate is on hold due to radiation therapy  Prostate cancer [facility]) - 7/16 he is under care of Dr. Tammi Klippel.  Chronic gout without tophus, unspecified cause, unspecified site no recent flare.  Primary osteoarthritis of both knees: Chronic pain  Primary osteoarthritis of both hands: Chronic pain  Primary osteoarthritis of both feet: Chronic pain  Psoriasis - Plantar: Few scattered lesions He has following chronic medical problems:  Deep vein thrombosis (DVT) of proximal lower extremity, unspecified chronicity, unspecified laterality (HCC)  Chronic obstructive pulmonary disease, unspecified COPD type (HCC)  Congestive heart failure, unspecified congestive heart failure chronicity, unspecified congestive heart failure type ([place])    Orders: No orders of the defined types were placed in this encounter.  Meds ordered this encounter  Medications  . predniSONE (DELTASONE) 5 MG tablet    Sig: Take 10 mg by mouth daily.    Refill:  2  . SYMBICORT 160-4.5 MCG/ACT inhaler    Sig: Inhale 2 puffs into the lungs 2 (two) times daily.    Refill:  2  . Turmeric 500 MG CAPS    Sig: Take 1 capsule by mouth daily.    Face-to-face time spent with patient was 30 minutes. 50% of time was spent in counseling and coordination of care.  Follow-Up Instructions: No Follow-up on file.   Bo Merino, MD

## 2016-10-31 NOTE — Patient Instructions (Addendum)
Prednisone Taper using 10 mg tablets:  Take 3 tablets (30 mg) daily for 4 days, then 2 and 1/2 tablets (25 mg) daily for 4 days, then 2 tablets (20 mg) daily for 4 days, then 1 and 1/2 tablets (15 mg) daily for 4 days, then continue 10 mg daily.    We have contacted your Oncologist to discuss starting [place].  We will let you know when we hear from them.  If we start [place], you will need standing labs 2 weeks after starting the medication.   Standing Labs We placed an order today for your standing lab work.    Please come back and get your standing labs two weeks after starting [place], four weeks after starting the medication, then every 2 months.    We have open lab Monday through Friday from 8:30-11:30 AM and 1-4 PM at the office of Dr. Tresa Moore, PA.   The office is located at [address] [place], [place], [place] [address] No appointment is necessary.   Labs are drawn by [facility].  You may receive a bill from [facility] for your lab work.     Leflunomide tablets What is this medicine? LEFLUNOMIDE (le FLOO na mide) is for rheumatoid arthritis. This medicine may be used for other purposes; ask your health care provider or pharmacist if you have questions. What should I tell my health care provider before I take this medicine? They need to know if you have any of these conditions: -alcoholism -bone marrow problems -fever or infection -immune system problems -kidney disease -liver disease -an unusual or allergic reaction to leflunomide, teriflunomide, other medicines, lactose, foods, dyes, or preservatives -pregnant or trying to get pregnant -breast-feeding How should I use this medicine? Take this medicine by mouth with a full glass of water. Follow the directions on the prescription label. Take your medicine at regular intervals. Do not take your medicine more often than directed. Do not stop taking except on your doctor's advice. Talk to your pediatrician  regarding the use of this medicine in children. Special care may be needed. Overdosage: If you think you have taken too much of this medicine contact a poison control center or emergency room at once. NOTE: This medicine is only for you. Do not share this medicine with others. What if I miss a dose? If you miss a dose, take it as soon as you can. If it is almost time for your next dose, take only that dose. Do not take double or extra doses. What may interact with this medicine? Do not take this medicine with any of the following medications: -teriflunomide This medicine may also interact with the following medications: -charcoal -cholestyramine -methotrexate -NSAIDs, medicines for pain and inflammation, like ibuprofen or naproxen -phenytoin -rifampin -tolbutamide -vaccines -warfarin This list may not describe all possible interactions. Give your health care provider a list of all the medicines, herbs, non-prescription drugs, or dietary supplements you use. Also tell them if you smoke, drink alcohol, or use illegal drugs. Some items may interact with your medicine. What should I watch for while using this medicine? Visit your doctor or health care professional for regular checks on your progress. You will need frequent blood checks while you are receiving the medicine. If you get a cold or other infection while receiving this medicine, call your doctor or health care professional. Do not treat yourself. The medicine may increase your risk of getting an infection. If you are a woman who has the potential to  become pregnant, discuss birth control options with your doctor or health care professional. Dennis Bast must not be pregnant, and you must be using a reliable form of birth control. The medicine may harm an unborn baby. Immediately call your doctor if you think you might be pregnant. Alcoholic drinks may increase possible damage to your liver. Do not drink alcohol while taking this medicine. What  side effects may I notice from receiving this medicine? Side effects that you should report to your doctor or health care professional as soon as possible: -allergic reactions like skin rash, itching or hives, swelling of the face, lips, or tongue -cough -difficulty breathing or shortness of breath -fever, chills or any other sign of infection -redness, blistering, peeling or loosening of the skin, including inside the mouth -unusual bleeding or bruising -unusually weak or tired -vomiting -yellowing of eyes or skin Side effects that usually do not require medical attention (report to your doctor or health care professional if they continue or are bothersome): -diarrhea -hair loss -headache -nausea This list may not describe all possible side effects. Call your doctor for medical advice about side effects. You may report side effects to FDA at 1-800-FDA-1088. Where should I keep my medicine? Keep out of the reach of children. Store at room temperature between 15 and 30 degrees C (59 and 86 degrees F). Protect from moisture and light. Throw away any unused medicine after the expiration date. NOTE: This sheet is a summary. It may not cover all possible information. If you have questions about this medicine, talk to your doctor, pharmacist, or health care provider.    2016, Elsevier/Gold Standard. (2013-12-14 10:53:11)

## 2016-10-31 NOTE — Telephone Encounter (Signed)
I have left message for Dr Tammi Klippel to call back on Dr Estanislado Pandy cell, he has tried calling our office today and has gotten voice mail. I have not been at my desk, have been in clinic. I left message on the triage nurse line

## 2016-10-31 NOTE — Telephone Encounter (Signed)
Phoned patient at home. Explained that per Shona Simpson, PA-C he should NOT take [place] for his arthritis while receiving radiation therapy. Explained that this medication is an immunomodulating medication and should be avoided while receiving xrt. Also, encouraged patient to follow up with PCP about management of sore throat and since drainage. Patient verbalized understanding. Faxed contraindication form for Arava back to Dr. Arlean Hopping office at [phone]. Confirmation fax of delivery obtained.   Side note: patient reports nocturia x 3. Denies dysuria or hematuria. Patient understands he will be seen for PUT tomorrow by Dr. Lisbeth Renshaw.

## 2016-10-31 NOTE — Progress Notes (Signed)
I spoke with Dr. Estanislado Pandy of rheumatology about this patient's increasing pain and swelling from arthritis.  We discussed treatment options, which include Arava.  After a discussion of the pros and cons of the following options : Arava,  Methotrexate, high dose steroids, or halting radiation therapy, I expressed support to proceed with [place].  There is concern surrounding infection risk in patients who have chemo with Arava.  However, I do not see a documented risk of increased toxicity in patients receiving Arava and radiation therapy.

## 2016-10-31 NOTE — Progress Notes (Signed)
Pharmacy Note  Subjective: Patient presents today to the [facility] to see Dr. Estanislado Pandy.  Patient seen by the pharmacist for counseling on leflunomide Jolee Ewing).    Objective: CMP (09/16/16) SCr: 0.88 mg/dL AST: 18 U/L ALT: 16 U/L Albumin: 3.9 g/dL  CBC (09/16/16) WBC: 10.7 K/uL Hgb: 15.4 g/dL Hct: 45.1 % PLT: 303 K/uL  TB Gold: negative (01/10/15)  Vitals:   10/31/16 0822  BP: 124/67  Pulse: 78  Resp: 16    Assessment/Plan: Patient was counseled on the purpose, proper use, and adverse effects of leflunomide including risk of infection, nausea/diarrhea/weight loss, increase in blood pressure, rash, hair loss, tingling in the hands and feet, and signs and symptoms of interstitial lung disease.  Discussed the importance of frequent monitoring of liver function and blood counts, and patient was provided with instructions for standing labs if leflunomide is initiated.  Provided patient with educational materials on leflunomide and answered all questions.  Patient consented to [place] use, and consent will be uploaded into the media tab.  Patient's oncologist has been contacted regarding initiation of leflunomide.  Patient is aware that leflunomide will not be initiated until he is cleared by his oncologist.    Elisabeth Most, Pharm.D., BCPS Clinical Pharmacist Pager: [phone] Phone: [phone] 10/31/2016 9:36 AM

## 2016-10-31 NOTE — Telephone Encounter (Signed)
Dr Tammi Klippel has returned call to Dr Estanislado Pandy. They have discussed patient, since patient is having such a difficult time with his RA Dr Tammi Klippel has agreed to let him start [place]. Normally he would recommend against a DMARD, but will make exception since patient is in such pain with his joints.   I have called patient to advise.

## 2016-10-31 NOTE — Telephone Encounter (Signed)
Phoned patient back and explained that Dr. Tammi Klippel DOES support him taking Arava after speaking with Dr. Estanislado Pandy. Patient verbalized understanding.

## 2016-11-01 ENCOUNTER — Telehealth: Payer: Self-pay | Admitting: Radiology

## 2016-11-01 ENCOUNTER — Ambulatory Visit
Admission: RE | Admit: 2016-11-01 | Discharge: 2016-11-01 | Disposition: A | Discharge status: Home / Self Care | Payer: Medicare Other | Source: Ambulatory Visit | Attending: Radiation Oncology | Admitting: Radiation Oncology

## 2016-11-01 DIAGNOSIS — Z510 Encounter for antineoplastic radiation therapy: Secondary | ICD-10-CM | POA: Diagnosis not present

## 2016-11-01 DIAGNOSIS — C61 Malignant neoplasm of prostate: Secondary | ICD-10-CM | POA: Diagnosis not present

## 2016-11-01 NOTE — Telephone Encounter (Signed)
Patient called back this morning states he appreciates the time you spent with him yesterday and he states he feels better after taking the [place] yesterday, he still has pain, but already feels better. He is very Patent attorney.

## 2016-11-01 NOTE — Telephone Encounter (Signed)
[name] has called [place] back today to let us know he appreciates the time we spent with him yesterday. Dr Estanislado Pandy wants to let you know she appreciates your call and your help caring for this patient.

## 2016-11-04 ENCOUNTER — Ambulatory Visit
Admission: RE | Admit: 2016-11-04 | Discharge: 2016-11-04 | Disposition: A | Discharge status: Home / Self Care | Payer: Medicare Other | Source: Ambulatory Visit | Attending: Radiation Oncology | Admitting: Radiation Oncology

## 2016-11-04 DIAGNOSIS — Z510 Encounter for antineoplastic radiation therapy: Secondary | ICD-10-CM | POA: Diagnosis not present

## 2016-11-04 DIAGNOSIS — C61 Malignant neoplasm of prostate: Secondary | ICD-10-CM | POA: Diagnosis not present

## 2016-11-05 ENCOUNTER — Telehealth: Payer: Self-pay | Admitting: Radiology

## 2016-11-05 ENCOUNTER — Ambulatory Visit
Admission: RE | Admit: 2016-11-05 | Discharge: 2016-11-05 | Disposition: A | Discharge status: Home / Self Care | Payer: Medicare Other | Source: Ambulatory Visit | Attending: Radiation Oncology | Admitting: Radiation Oncology

## 2016-11-05 DIAGNOSIS — Z510 Encounter for antineoplastic radiation therapy: Secondary | ICD-10-CM | POA: Diagnosis not present

## 2016-11-05 DIAGNOSIS — C61 Malignant neoplasm of prostate: Secondary | ICD-10-CM | POA: Diagnosis not present

## 2016-11-05 NOTE — Telephone Encounter (Signed)
Pt. should contact his INR closely monitored by PCP

## 2016-11-05 NOTE — Telephone Encounter (Signed)
I have gotten notice from pharmacy regarding Arava/Coumadin can we discuss ?

## 2016-11-05 NOTE — Telephone Encounter (Signed)
Have discussed with Dr Estanislado Pandy / called patient to see when he is due for INR check.

## 2016-11-05 NOTE — Telephone Encounter (Signed)
Left message for him to call me back to discus s

## 2016-11-06 ENCOUNTER — Ambulatory Visit
Admission: RE | Admit: 2016-11-06 | Discharge: 2016-11-06 | Disposition: A | Discharge status: Home / Self Care | Payer: Medicare Other | Source: Ambulatory Visit | Attending: Radiation Oncology | Admitting: Radiation Oncology

## 2016-11-06 DIAGNOSIS — Z510 Encounter for antineoplastic radiation therapy: Secondary | ICD-10-CM | POA: Diagnosis not present

## 2016-11-06 DIAGNOSIS — C61 Malignant neoplasm of prostate: Secondary | ICD-10-CM | POA: Diagnosis not present

## 2016-11-07 ENCOUNTER — Encounter: Payer: Self-pay | Admitting: Medical Oncology

## 2016-11-07 ENCOUNTER — Ambulatory Visit
Admission: RE | Admit: 2016-11-07 | Discharge: 2016-11-07 | Disposition: A | Discharge status: Home / Self Care | Payer: Medicare Other | Source: Ambulatory Visit | Attending: Radiation Oncology | Admitting: Radiation Oncology

## 2016-11-07 VITALS — BP 148/78 | HR 75 | Resp 18 | Wt 229.4 lb

## 2016-11-07 DIAGNOSIS — C61 Malignant neoplasm of prostate: Secondary | ICD-10-CM | POA: Diagnosis not present

## 2016-11-07 DIAGNOSIS — Z510 Encounter for antineoplastic radiation therapy: Secondary | ICD-10-CM | POA: Diagnosis not present

## 2016-11-07 DIAGNOSIS — Z7901 Long term (current) use of anticoagulants: Secondary | ICD-10-CM | POA: Diagnosis not present

## 2016-11-07 NOTE — Progress Notes (Signed)
Weight and vital signs stable. Reports jaw and low back pain have resolved. Reports mild pain in his left knee and fingers related to effects of arthritis. [place] seems to be helping ease pain associated with arthritis Denies dysuria or hematuria. Reports nocturia x 3-4. Reports his appetite is poor. Frequency of nausea is less.Denies diarrhea.Reports regularity with bowels due to increased fluid intake. Reports bowels are formed. Reports he takes three Colace tablets per day. Reports ulcer in the roof of his mouth are healing after he stopped taking folic acid. Reports an occasional dry cough and sore throat. Reports mild fatigue.   BP (!) 148/78 (BP Location: Left Arm, Patient Position: Sitting, Cuff Size: Normal)   Pulse 75   Resp 18   Wt 229 lb 6.4 oz (104.1 kg)   SpO2 100%   BMI 33.88 kg/m  Wt Readings from Last 3 Encounters:  11/07/16 229 lb 6.4 oz (104.1 kg)  10/31/16 227 lb (103 kg)  10/29/16 229 lb 3.2 oz (104 kg)

## 2016-11-07 NOTE — Telephone Encounter (Signed)
I have discussed with him and Dr Estanislado Pandy, he states he can have his INR checked any time and will do this soon. He is improving. He does complain of sweats at night, advised him likely prednisone and to try using in the am, instead of pm.

## 2016-11-07 NOTE — Progress Notes (Signed)
  Radiation Oncology         [phone]   Name: [name] MRN: [id number]   Date: 11/07/2016  DOB: [birth year]     Weekly Radiation Therapy Management    ICD-9-CM ICD-10-CM   1. Malignant neoplasm of prostate (HCC) 185 C61     Current Dose: 23.4 Gy  Planned Dose:  68.4 Gy  Narrative The patient presents for routine under treatment assessment.  Weight and vital signs stable. Reports jaw and low back pain have resolved. Reports mild pain in his left knee and fingers related to effects of arthritis. [name] seems to be helping ease pain associated with arthritis Denies dysuria or hematuria. Reports nocturia x 3-4. Reports his appetite is poor. Frequency of nausea is less.Denies diarrhea. Reports regularity with bowels due to increased fluid intake. Reports bowels are formed. Reports he takes three Colace tablets per day. Reports ulcer in the roof of his mouth are healing after he stopped taking folic acid. Reports an occasional dry cough and sore throat. Reports mild fatigue. He has been having sinus drainage and acid reflux. He will speak with Dr. Estanislado Pandy.   Set-up films were reviewed. The chart was checked.  Physical Findings  weight is 229 lb 6.4 oz (104.1 kg). His blood pressure is 148/78 (abnormal) and his pulse is 75. His respiration is 18 and oxygen saturation is 100%. . Weight essentially stable.  Ampltous ulcer on roof of mouth about 4 mm in size, midline at the hard palate. Left neck hyperpigmentation consistent with immunomodulating therapy.  Impression The patient is tolerating radiation.  Plan Continue treatment as planned.         [name] Tammi Klippel, M.D.  This document serves as a record of services personally performed by Tyler Pita, MD and Shona Simpson, PA-C. It was created on his behalf by Arlyce Harman, a trained medical scribe. The creation of this record is based on the scribe's personal observations and the provider's statements to them.  This document has been checked and approved by the attending provider.

## 2016-11-08 ENCOUNTER — Telehealth: Payer: Self-pay | Admitting: Rheumatology

## 2016-11-08 ENCOUNTER — Ambulatory Visit
Admission: RE | Admit: 2016-11-08 | Discharge: 2016-11-08 | Disposition: A | Discharge status: Home / Self Care | Payer: Medicare Other | Source: Ambulatory Visit | Attending: Radiation Oncology | Admitting: Radiation Oncology

## 2016-11-08 DIAGNOSIS — Z510 Encounter for antineoplastic radiation therapy: Secondary | ICD-10-CM | POA: Diagnosis not present

## 2016-11-08 DIAGNOSIS — C61 Malignant neoplasm of prostate: Secondary | ICD-10-CM | POA: Diagnosis not present

## 2016-11-08 NOTE — Telephone Encounter (Signed)
Patient has questions about his arthritis. Please call patient.

## 2016-11-08 NOTE — Progress Notes (Addendum)
Patient came to nursing  C/o arthritic pain and sore throat, stated feels like something in his throat, cold keeps coming back, wearing  a mask , takes mucinex and  prilosec, we are treating his prostate, was seen by our MD yesterday, patient called his Primary MD and couldn't be seen tillnext Friday, vital wnl no fever, no coughing up phelgm, tongue is pink and moist,  Has rx for hydrocodne from Dr. Tammi Klippel 10/29/16, hasn't taken any, he stated he didn't want to get addicted ", sugessted he take 1 hydrocodone when he gets home today with food to see if that helps his arthritic pain, to drink plenty water, nedi pot suggested for sinus congestion, patient also told if fevers come up go to Urgent care, patient thanked this RN for listening 10:37 AM

## 2016-11-09 ENCOUNTER — Encounter: Payer: Self-pay | Admitting: Rheumatology

## 2016-11-09 NOTE — Progress Notes (Signed)
Pt called oncall service at approx 8:20am saturday morning; November 09, 2016  [phone] C/O arthritis pain; Having flare  Cc: Pain waking pt up at night.   Hpi: ===> Taking prednisone taper rx'd by dr d at recent visit Started arava 20mg  approx early nov 2017 Has prostate cancer and currently getting radiation tx Stopped MTX about few months ago.   Currently, tapered down prednisone from 30mg  to 20mg .   At 20mg , pts arthritis not well controlled and is flaring.   I advised pt to go back up to  Prednisone 30mg  q AM x 4 days.  Call me at office on tuesday so i can discuss new taper schedule  For current pain....ok  To use tylenol prn 1gram tid for next 3-4 days.   Also...has hydrocodone. .. can use 1 tablet at bedtime prn due to night time pain causing pt to wake up   Continue arava....will take time to get in system and start having affect on arthritis.   Call me if any problems.   Note: pt using prednisone at 3 or 4 am.  I advised pt the proper way to use prednisone.   Pt agreeable.   ==========  I had access to pts chart after the call and reviewed patient's chart after i returned his phone call.

## 2016-11-11 ENCOUNTER — Ambulatory Visit
Admission: RE | Admit: 2016-11-11 | Discharge: 2016-11-11 | Disposition: A | Discharge status: Home / Self Care | Payer: Medicare Other | Source: Ambulatory Visit | Attending: Radiation Oncology | Admitting: Radiation Oncology

## 2016-11-11 DIAGNOSIS — Z510 Encounter for antineoplastic radiation therapy: Secondary | ICD-10-CM | POA: Diagnosis not present

## 2016-11-11 DIAGNOSIS — C61 Malignant neoplasm of prostate: Secondary | ICD-10-CM | POA: Diagnosis not present

## 2016-11-11 NOTE — Telephone Encounter (Signed)
I have left message for him to call me back.  Have looked at [name] message from Saturday [name] increased his Prednisone to 30mg  on Saturday, he was having a difficult time with the taper Jolee Ewing has not had time to be fully effective yet.   Will you discuss taper of prednisone with me ? And [name] if needed

## 2016-11-11 NOTE — Telephone Encounter (Signed)
I have called him, his mailbox is full

## 2016-11-12 ENCOUNTER — Ambulatory Visit
Admission: RE | Admit: 2016-11-12 | Discharge: 2016-11-12 | Disposition: A | Discharge status: Home / Self Care | Payer: Medicare Other | Source: Ambulatory Visit | Attending: Radiation Oncology | Admitting: Radiation Oncology

## 2016-11-12 ENCOUNTER — Other Ambulatory Visit: Payer: Self-pay | Admitting: Radiology

## 2016-11-12 DIAGNOSIS — Z510 Encounter for antineoplastic radiation therapy: Secondary | ICD-10-CM | POA: Diagnosis not present

## 2016-11-12 DIAGNOSIS — C61 Malignant neoplasm of prostate: Secondary | ICD-10-CM | POA: Diagnosis not present

## 2016-11-12 MED ORDER — PREDNISONE 10 MG PO TABS: ORAL_TABLET | ORAL | 0 refills | Status: DC

## 2016-11-12 NOTE — Telephone Encounter (Signed)
Sent, but can we discuss ? I have a question

## 2016-11-12 NOTE — Telephone Encounter (Signed)
New prednisone taper for patient which will need to be called and is as follows ===> Prednisone 10 mg 3 pills for 7 days  === 21 pills 2  1/2 pills for 7 days  === 18 pills 2 pills for 7 days  == 14 pills 1 1/2 pills for 7 days == 10 pills 1 pill for 7 days == 7 pills 1/2 pill for 14 days. == 7 pills Disp 77 pills   Advise pt of above taper and to take till all gone. Please call in this Rx to his drug store.

## 2016-11-12 NOTE — Telephone Encounter (Signed)
Prednisone taper as discussed

## 2016-11-12 NOTE — Telephone Encounter (Signed)
New prednisone taper for patient which will need to be called and is as follows ===> Prednisone 10 mg 3 pills for 7 days  === 21 pills 2  1/2 pills for 7 days  === 18 pills 2 pills for 7 days  == 14 pills 1 1/2 pills for 7 days == 10 pills 1 pill for 7 days == 7 pills 1/2 pill for 14 days. == 7 pills Disp 77 pills

## 2016-11-12 NOTE — Telephone Encounter (Signed)
ok 

## 2016-11-13 ENCOUNTER — Ambulatory Visit
Admission: RE | Admit: 2016-11-13 | Discharge: 2016-11-13 | Disposition: A | Discharge status: Home / Self Care | Payer: Medicare Other | Source: Ambulatory Visit | Attending: Radiation Oncology | Admitting: Radiation Oncology

## 2016-11-13 ENCOUNTER — Other Ambulatory Visit: Payer: Self-pay | Admitting: Rheumatology

## 2016-11-13 DIAGNOSIS — Z79899 Other long term (current) drug therapy: Secondary | ICD-10-CM | POA: Diagnosis not present

## 2016-11-13 DIAGNOSIS — Z7901 Long term (current) use of anticoagulants: Secondary | ICD-10-CM | POA: Diagnosis not present

## 2016-11-13 DIAGNOSIS — C61 Malignant neoplasm of prostate: Secondary | ICD-10-CM | POA: Diagnosis not present

## 2016-11-13 DIAGNOSIS — Z510 Encounter for antineoplastic radiation therapy: Secondary | ICD-10-CM | POA: Diagnosis not present

## 2016-11-14 ENCOUNTER — Ambulatory Visit
Admission: RE | Admit: 2016-11-14 | Discharge: 2016-11-14 | Disposition: A | Discharge status: Home / Self Care | Payer: Medicare Other | Source: Ambulatory Visit | Attending: Radiation Oncology | Admitting: Radiation Oncology

## 2016-11-14 VITALS — BP 139/80 | HR 79 | Resp 18 | Wt 225.4 lb

## 2016-11-14 DIAGNOSIS — Z510 Encounter for antineoplastic radiation therapy: Secondary | ICD-10-CM | POA: Diagnosis not present

## 2016-11-14 DIAGNOSIS — C61 Malignant neoplasm of prostate: Secondary | ICD-10-CM | POA: Diagnosis not present

## 2016-11-14 LAB — CBC WITH DIFFERENTIAL/PLATELET
Basophils Absolute: 0 10*3/uL (ref 0.0–0.2)
Basos: 0 %
EOS (ABSOLUTE): 0 10*3/uL (ref 0.0–0.4)
Eos: 0 %
Hematocrit: 41.3 % (ref 37.5–51.0)
Hemoglobin: 14 g/dL (ref 12.6–17.7)
Immature Grans (Abs): 0 10*3/uL (ref 0.0–0.1)
Immature Granulocytes: 0 %
Lymphocytes Absolute: 0.4 10*3/uL — ABNORMAL LOW (ref 0.7–3.1)
Lymphs: 4 %
MCH: 29.5 pg (ref 26.6–33.0)
MCHC: 33.9 g/dL (ref 31.5–35.7)
MCV: 87 fL (ref 79–97)
Monocytes Absolute: 0.4 10*3/uL (ref 0.1–0.9)
Monocytes: 4 %
Neutrophils Absolute: 9.7 10*3/uL — ABNORMAL HIGH (ref 1.4–7.0)
Neutrophils: 92 %
Platelets: 234 10*3/uL (ref 150–379)
RBC: 4.75 x10E6/uL (ref 4.14–5.80)
RDW: 15.8 % — ABNORMAL HIGH (ref 12.3–15.4)
WBC: 10.5 10*3/uL (ref 3.4–10.8)

## 2016-11-14 LAB — COMPREHENSIVE METABOLIC PANEL
ALT: 48 IU/L — ABNORMAL HIGH (ref 0–44)
AST: 34 IU/L (ref 0–40)
Albumin/Globulin Ratio: 1.1 — ABNORMAL LOW (ref 1.2–2.2)
Albumin: 3.6 g/dL (ref 3.6–4.8)
Alkaline Phosphatase: 99 IU/L (ref 39–117)
BUN/Creatinine Ratio: 15 (ref 10–24)
BUN: 14 mg/dL (ref 8–27)
Bilirubin Total: 0.4 mg/dL (ref 0.0–1.2)
CO2: 28 mmol/L (ref 18–29)
Calcium: 9 mg/dL (ref 8.6–10.2)
Chloride: 95 mmol/L — ABNORMAL LOW (ref 96–106)
Creatinine, Ser: 0.93 mg/dL (ref 0.76–1.27)
GFR calc Af Amer: 99 mL/min/{1.73_m2} (ref >59)
GFR calc non Af Amer: 85 mL/min/{1.73_m2} (ref >59)
Globulin, Total: 3.2 g/dL (ref 1.5–4.5)
Glucose: 184 mg/dL — ABNORMAL HIGH (ref 65–99)
Potassium: 4.6 mmol/L (ref 3.5–5.2)
Sodium: 138 mmol/L (ref 134–144)
Total Protein: 6.8 g/dL (ref 6.0–8.5)

## 2016-11-14 NOTE — Progress Notes (Signed)
Vitals stable. 4 lb weight loss noted. Reports arthritis is well managed with Arava. Denies dysuria or hematuria. Reports nocturia x 3-4. Denies diarrhea. Reports occasional nausea continues. Reports sore throat and nasal congestion have resolved. Reports he had a piece of food lodged in his throat that dislodged recently thus, he is swallowing better.   BP 139/80 (BP Location: Left Arm, Patient Position: Sitting, Cuff Size: Normal)   Pulse 79   Resp 18   Wt 225 lb 6.4 oz (102.2 kg)   SpO2 98%   BMI 33.29 kg/m ' Wt Readings from Last 3 Encounters:  11/14/16 225 lb 6.4 oz (102.2 kg)  11/07/16 229 lb 6.4 oz (104.1 kg)  10/31/16 227 lb (103 kg)

## 2016-11-14 NOTE — Progress Notes (Signed)
He should see PCP for elevated glucose. His LFTs are elevated we will have to monitor them closely. After restarting Arava he should get his labs in 2 weeks, 4 weeks then every 2 months. Please fax labs to PCP as well.

## 2016-11-14 NOTE — Progress Notes (Signed)
  Radiation Oncology         [phone]   Name: [name] MRN: [id number]   Date: 11/14/2016  DOB: Oct 03, 1951     Weekly Radiation Therapy Management    ICD-9-CM ICD-10-CM   1. Malignant neoplasm of prostate (HCC) 185 C61     Current Dose: 32.4 Gy  Planned Dose:  68.4 Gy  Narrative The patient presents for routine under treatment assessment.  Reports arthritis is well managed with Arava and back pain. Denies dysuria, hematuria, or diarrhea. Reports nocturia x 3-4. Denies diarrhea. Reports occasional nausea continues, his sore throat and nasal congestion has resolved, and also reports he had a piece of food lodged in his throat that was recently dislodged therefore he is swallowing better. The patient presents to the clinic wearing a mask and gloves.  Set-up films were reviewed. The chart was checked.  Physical Findings  weight is 225 lb 6.4 oz (102.2 kg). His blood pressure is 139/80 and his pulse is 79. His respiration is 18 and oxygen saturation is 98%.   4 lb weight loss noted since 11/07/16.  Impression The patient is tolerating radiation.  Plan Continue treatment as planned. I advised the patient to take Tylenol or Aleve before getting his treatments since his back pain could be caused by the hard treatment table.         Sheral Apley Tammi Klippel, M.D.  This document serves as a record of services personally performed by Tyler Pita, MD. It was created on his behalf by Darcus Austin, a trained medical scribe. The creation of this record is based on the scribe's personal observations and the provider's statements to them. This document has been checked and approved by the attending provider.

## 2016-11-15 ENCOUNTER — Ambulatory Visit
Admission: RE | Admit: 2016-11-15 | Discharge: 2016-11-15 | Disposition: A | Discharge status: Home / Self Care | Payer: Medicare Other | Source: Ambulatory Visit | Attending: Radiation Oncology | Admitting: Radiation Oncology

## 2016-11-15 DIAGNOSIS — G40309 Generalized idiopathic epilepsy and epileptic syndromes, not intractable, without status epilepticus: Secondary | ICD-10-CM | POA: Diagnosis not present

## 2016-11-15 DIAGNOSIS — Z510 Encounter for antineoplastic radiation therapy: Secondary | ICD-10-CM | POA: Diagnosis not present

## 2016-11-15 DIAGNOSIS — C61 Malignant neoplasm of prostate: Secondary | ICD-10-CM | POA: Diagnosis not present

## 2016-11-15 DIAGNOSIS — Z6833 Body mass index (BMI) 33.0-33.9, adult: Secondary | ICD-10-CM | POA: Diagnosis not present

## 2016-11-15 DIAGNOSIS — I82409 Acute embolism and thrombosis of unspecified deep veins of unspecified lower extremity: Secondary | ICD-10-CM | POA: Diagnosis not present

## 2016-11-17 ENCOUNTER — Ambulatory Visit
Admission: RE | Admit: 2016-11-17 | Discharge: 2016-11-17 | Disposition: A | Discharge status: Home / Self Care | Payer: Medicare Other | Source: Ambulatory Visit | Attending: Radiation Oncology | Admitting: Radiation Oncology

## 2016-11-17 DIAGNOSIS — Z510 Encounter for antineoplastic radiation therapy: Secondary | ICD-10-CM | POA: Diagnosis not present

## 2016-11-17 DIAGNOSIS — C61 Malignant neoplasm of prostate: Secondary | ICD-10-CM | POA: Diagnosis not present

## 2016-11-18 ENCOUNTER — Telehealth: Payer: Self-pay | Admitting: Radiology

## 2016-11-18 ENCOUNTER — Ambulatory Visit
Admission: RE | Admit: 2016-11-18 | Discharge: 2016-11-18 | Disposition: A | Discharge status: Home / Self Care | Payer: Medicare Other | Source: Ambulatory Visit | Attending: Radiation Oncology | Admitting: Radiation Oncology

## 2016-11-18 DIAGNOSIS — C61 Malignant neoplasm of prostate: Secondary | ICD-10-CM | POA: Diagnosis not present

## 2016-11-18 DIAGNOSIS — Z510 Encounter for antineoplastic radiation therapy: Secondary | ICD-10-CM | POA: Diagnosis not present

## 2016-11-18 NOTE — Telephone Encounter (Signed)
Patient called he is on 30mg  of prednisone currently, c/o pain wrists/all of joint, please advise if there is anything we can recommend for him, he is using Arava 20mg  as well, but c/o severe joint pains.

## 2016-11-19 ENCOUNTER — Ambulatory Visit
Admission: RE | Admit: 2016-11-19 | Discharge: 2016-11-19 | Disposition: A | Discharge status: Home / Self Care | Payer: Medicare Other | Source: Ambulatory Visit | Attending: Radiation Oncology | Admitting: Radiation Oncology

## 2016-11-19 ENCOUNTER — Telehealth: Payer: Self-pay | Admitting: Radiology

## 2016-11-19 DIAGNOSIS — C61 Malignant neoplasm of prostate: Secondary | ICD-10-CM | POA: Diagnosis not present

## 2016-11-19 DIAGNOSIS — Z510 Encounter for antineoplastic radiation therapy: Secondary | ICD-10-CM | POA: Diagnosis not present

## 2016-11-19 NOTE — Telephone Encounter (Signed)
I think Orencia should be OK.

## 2016-11-19 NOTE — Addendum Note (Signed)
Addended byCandice Camp on: 11/19/2016 02:22 PM   Modules accepted: Orders

## 2016-11-19 NOTE — Telephone Encounter (Signed)
Mr. Giangrasso continues to have a lot of pain and swelling. Despite taking 30 mg of prednisone he's not controlled. He is on Arava 20 mg a day now. I understand that she would not be able to start methotrexate until he finishes his radiation therapy. Methotrexate and Arava combination could be a choice in future. As he is allergic to sulfa he cannot use sulfasalazine for Plaquenil. He was doing very well on Orencia in the past. I wanted to know you opinion if he can start him on SQOrencia again.

## 2016-11-19 NOTE — Telephone Encounter (Addendum)
Called patient to advise his glucose was elevated with his last set of labs/ and we are trying to figure out the next step for his arthritis pain with Dr Tammi Klippel. [name] states he only has a couple more weeks of radiation left ,he thinks he will be fine with current treatment until then, but appreciates [place] reaching out to Dr Tammi Klippel to discuss  When he goes for his next set of labs, he will fast so we can get an accurate glucose, if elevated he will discuss with PCP

## 2016-11-19 NOTE — Telephone Encounter (Signed)
Thank you. He'll be very pleased to know.

## 2016-11-20 ENCOUNTER — Ambulatory Visit
Admission: RE | Admit: 2016-11-20 | Discharge: 2016-11-20 | Disposition: A | Discharge status: Home / Self Care | Payer: Medicare Other | Source: Ambulatory Visit | Attending: Radiation Oncology | Admitting: Radiation Oncology

## 2016-11-20 ENCOUNTER — Encounter: Payer: Self-pay | Admitting: Radiation Oncology

## 2016-11-20 VITALS — BP 140/89 | HR 79 | Resp 18 | Wt 220.8 lb

## 2016-11-20 DIAGNOSIS — Z510 Encounter for antineoplastic radiation therapy: Secondary | ICD-10-CM | POA: Diagnosis not present

## 2016-11-20 DIAGNOSIS — C61 Malignant neoplasm of prostate: Secondary | ICD-10-CM | POA: Diagnosis not present

## 2016-11-20 NOTE — Progress Notes (Signed)
Vitals stable. 5 lb weight loss noted. Reports arthritis is well managed with Arava. Denies dysuria or hematuria. Reports nocturia x 4. Reports occasional nausea continues. Reports urine stream is steady without difficulty emptying his bladder. Reports occasional leakage. Denies urgency. Denies diarrhea. Reports mild fatigue.  BP 140/89 (BP Location: Left Arm, Patient Position: Sitting, Cuff Size: Normal)   Pulse 79   Resp 18   Wt 220 lb 12.8 oz (100.2 kg)   SpO2 100%   BMI 32.61 kg/m  Wt Readings from Last 3 Encounters:  11/20/16 220 lb 12.8 oz (100.2 kg)  11/14/16 225 lb 6.4 oz (102.2 kg)  11/07/16 229 lb 6.4 oz (104.1 kg)

## 2016-11-20 NOTE — Progress Notes (Signed)
  Radiation Oncology         [phone]   Name: [name] MRN: [id number]   Date: 11/20/2016  DOB: [birth year]     Weekly Radiation Therapy Management    ICD-9-CM ICD-10-CM   1. Malignant neoplasm of prostate (HCC) 185 C61     Current Dose: 41.4 Gy  Planned Dose:  68.4 Gy  Narrative The patient presents for routine under treatment assessment.  Vitals stable. 5 lb weight loss noted. Reports arthritis is well managed with Arava. He takes a pain pill in the evenings if his hands are painful. Denies dysuria or hematuria. Reports nocturia x 4. Reports occasional nausea continues. Reports urine stream is steady without difficulty emptying his bladder. Reports occasional leakage. Denies urgency. Denies diarrhea. Reports mild fatigue.  Set-up films were reviewed. The chart was checked.  Physical Findings  weight is 220 lb 12.8 oz (100.2 kg). His blood pressure is 140/89 and his pulse is 79. His respiration is 18 and oxygen saturation is 100%.   Alert and in no acute distress. Wearing face mask and nitrile gloves. 5 lb weight loss noted since 11/14/16.  Impression The patient is tolerating radiation.  Plan Continue treatment as planned.          Sheral Apley Tammi Klippel, M.D.  This document serves as a record of services personally performed by Tyler Pita, MD. It was created on his behalf by Arlyce Harman, a trained medical scribe. The creation of this record is based on the scribe's personal observations and the provider's statements to them. This document has been checked and approved by the attending provider.

## 2016-11-22 ENCOUNTER — Ambulatory Visit: Payer: Medicare Other

## 2016-11-25 ENCOUNTER — Ambulatory Visit
Admission: RE | Admit: 2016-11-25 | Discharge: 2016-11-25 | Disposition: A | Discharge status: Home / Self Care | Payer: Medicare Other | Source: Ambulatory Visit | Attending: Radiation Oncology | Admitting: Radiation Oncology

## 2016-11-25 ENCOUNTER — Other Ambulatory Visit: Payer: Self-pay | Admitting: Rheumatology

## 2016-11-25 DIAGNOSIS — Z7901 Long term (current) use of anticoagulants: Secondary | ICD-10-CM | POA: Diagnosis not present

## 2016-11-25 DIAGNOSIS — Z79899 Other long term (current) drug therapy: Secondary | ICD-10-CM | POA: Diagnosis not present

## 2016-11-25 DIAGNOSIS — C61 Malignant neoplasm of prostate: Secondary | ICD-10-CM | POA: Diagnosis not present

## 2016-11-25 DIAGNOSIS — Z510 Encounter for antineoplastic radiation therapy: Secondary | ICD-10-CM | POA: Diagnosis not present

## 2016-11-26 ENCOUNTER — Ambulatory Visit
Admission: RE | Admit: 2016-11-26 | Discharge: 2016-11-26 | Disposition: A | Discharge status: Home / Self Care | Payer: Medicare Other | Source: Ambulatory Visit | Attending: Radiation Oncology | Admitting: Radiation Oncology

## 2016-11-26 DIAGNOSIS — Z510 Encounter for antineoplastic radiation therapy: Secondary | ICD-10-CM | POA: Diagnosis not present

## 2016-11-26 DIAGNOSIS — C61 Malignant neoplasm of prostate: Secondary | ICD-10-CM | POA: Diagnosis not present

## 2016-11-26 LAB — CBC WITH DIFFERENTIAL/PLATELET
Basophils Absolute: 0.1 10*3/uL (ref 0.0–0.2)
Basos: 1 %
EOS (ABSOLUTE): 0.4 10*3/uL (ref 0.0–0.4)
Eos: 4 %
Hematocrit: 43.4 % (ref 37.5–51.0)
Hemoglobin: 14.7 g/dL (ref 12.6–17.7)
Immature Grans (Abs): 0 10*3/uL (ref 0.0–0.1)
Immature Granulocytes: 1 %
Lymphocytes Absolute: 0.9 10*3/uL (ref 0.7–3.1)
Lymphs: 11 %
MCH: 29.5 pg (ref 26.6–33.0)
MCHC: 33.9 g/dL (ref 31.5–35.7)
MCV: 87 fL (ref 79–97)
Monocytes Absolute: 1.1 10*3/uL — ABNORMAL HIGH (ref 0.1–0.9)
Monocytes: 12 %
Neutrophils Absolute: 6.1 10*3/uL (ref 1.4–7.0)
Neutrophils: 71 %
Platelets: 213 10*3/uL (ref 150–379)
RBC: 4.98 x10E6/uL (ref 4.14–5.80)
RDW: 16 % — ABNORMAL HIGH (ref 12.3–15.4)
WBC: 8.5 10*3/uL (ref 3.4–10.8)

## 2016-11-26 LAB — COMPREHENSIVE METABOLIC PANEL
ALT: 32 IU/L (ref 0–44)
AST: 22 IU/L (ref 0–40)
Albumin/Globulin Ratio: 1.4 (ref 1.2–2.2)
Albumin: 3.8 g/dL (ref 3.6–4.8)
Alkaline Phosphatase: 95 IU/L (ref 39–117)
BUN/Creatinine Ratio: 14 (ref 10–24)
BUN: 13 mg/dL (ref 8–27)
Bilirubin Total: 0.4 mg/dL (ref 0.0–1.2)
CO2: 31 mmol/L — ABNORMAL HIGH (ref 18–29)
Calcium: 9.3 mg/dL (ref 8.6–10.2)
Chloride: 97 mmol/L (ref 96–106)
Creatinine, Ser: 0.91 mg/dL (ref 0.76–1.27)
GFR calc Af Amer: 101 mL/min/{1.73_m2} (ref >59)
GFR calc non Af Amer: 88 mL/min/{1.73_m2} (ref >59)
Globulin, Total: 2.8 g/dL (ref 1.5–4.5)
Glucose: 103 mg/dL — ABNORMAL HIGH (ref 65–99)
Potassium: 4 mmol/L (ref 3.5–5.2)
Sodium: 144 mmol/L (ref 134–144)
Total Protein: 6.6 g/dL (ref 6.0–8.5)

## 2016-11-27 ENCOUNTER — Ambulatory Visit
Admission: RE | Admit: 2016-11-27 | Discharge: 2016-11-27 | Disposition: A | Discharge status: Home / Self Care | Payer: Medicare Other | Source: Ambulatory Visit | Attending: Radiation Oncology | Admitting: Radiation Oncology

## 2016-11-27 DIAGNOSIS — C61 Malignant neoplasm of prostate: Secondary | ICD-10-CM | POA: Diagnosis not present

## 2016-11-27 DIAGNOSIS — Z510 Encounter for antineoplastic radiation therapy: Secondary | ICD-10-CM | POA: Diagnosis not present

## 2016-11-28 ENCOUNTER — Telehealth: Payer: Self-pay | Admitting: Radiology

## 2016-11-28 ENCOUNTER — Ambulatory Visit
Admission: RE | Admit: 2016-11-28 | Discharge: 2016-11-28 | Disposition: A | Discharge status: Home / Self Care | Payer: Medicare Other | Source: Ambulatory Visit | Attending: Radiation Oncology | Admitting: Radiation Oncology

## 2016-11-28 DIAGNOSIS — Z510 Encounter for antineoplastic radiation therapy: Secondary | ICD-10-CM | POA: Diagnosis not present

## 2016-11-28 DIAGNOSIS — C61 Malignant neoplasm of prostate: Secondary | ICD-10-CM | POA: Diagnosis not present

## 2016-11-28 NOTE — Telephone Encounter (Signed)
I called to check on him and see how he is feeling, previously when we spoke he indicated he could tolerate things okay and did not seem like he wanted to restart the [place]. If he is still having pain and would like, we can restart. Left message for him to let me know.

## 2016-11-28 NOTE — Progress Notes (Signed)
Labs normal.

## 2016-11-29 ENCOUNTER — Ambulatory Visit
Admission: RE | Admit: 2016-11-29 | Discharge: 2016-11-29 | Disposition: A | Discharge status: Home / Self Care | Payer: Medicare Other | Source: Ambulatory Visit | Attending: Radiation Oncology | Admitting: Radiation Oncology

## 2016-11-29 VITALS — BP 135/85 | HR 82 | Resp 16 | Wt 224.2 lb

## 2016-11-29 DIAGNOSIS — Z510 Encounter for antineoplastic radiation therapy: Secondary | ICD-10-CM | POA: Diagnosis not present

## 2016-11-29 DIAGNOSIS — C61 Malignant neoplasm of prostate: Secondary | ICD-10-CM

## 2016-11-29 NOTE — Telephone Encounter (Signed)
I have reviewed patient's insurance information.  It appears patient has medicare and a [place].  In order to complete a benefits verification I will need to have patient sign a form.  I have called patient to inform him of this.  There was no answer and voicemail was full.

## 2016-11-29 NOTE — Telephone Encounter (Signed)
I spoke to patient and he states he has 2 more weeks of radiation. He indicates he has previously used Sub q [name] but would be interested in the infusions if they are better covered.   Dr [name], can we discuss if it is okay for him to infuse with Orencia instead of sub q Orencia?   [name] is performing a benefits investigation to see which is better covered.

## 2016-11-29 NOTE — Progress Notes (Signed)
  Radiation Oncology         [phone]   Name: [name] MRN: [id number]   Date: 11/29/2016  DOB: 1951-08-23     Weekly Radiation Therapy Management    ICD-9-CM ICD-10-CM   1. Malignant neoplasm of prostate (HCC) 185 C61     Current Dose: 50.4 Gy  Planned Dose:  68.4 Gy  Narrative The patient presents for routine under treatment assessment.  Weight and vitals stable. Reports increased pain associated with arthritis, mild dysuria, nocturia x5-6, and urinary urgency. Continues to take Lasix. Describes a strong steady urine stream without difficulty emptying. Denies hesitancy, hematuria, diarrhea, or fatigue.   Set-up films were reviewed. The chart was checked.  Physical Findings  weight is 224 lb 3.2 oz (101.7 kg). His blood pressure is 135/85 and his pulse is 82. His respiration is 16 and oxygen saturation is 100%.   Alert and in no acute distress. The patient is wearing a mask and gloves.  Impression The patient is tolerating radiation.  Plan Continue treatment as planned.          Sheral Apley Tammi Klippel, M.D.  This document serves as a record of services personally performed by Tyler Pita, MD. It was created on his behalf by Darcus Austin, a trained medical scribe. The creation of this record is based on the scribe's personal observations and the provider's statements to them. This document has been checked and approved by the attending provider.

## 2016-11-29 NOTE — Progress Notes (Signed)
Weight and vitals stable. Reports increased pain associated with arthritis. Reports mild dysuria. Denies hematuria. Reports nocturia x 5-6. Continues to take Lasix. Describes a strong steady urine stream without difficulty emptying. Reports urinary urgency. Denies hesitancy. Denies diarrhea. Denies fatigues.   BP 135/85 (BP Location: Left Arm, Patient Position: Sitting, Cuff Size: Normal)   Pulse 82   Resp 16   Wt 224 lb 3.2 oz (101.7 kg)   SpO2 100%   BMI 33.11 kg/m  Wt Readings from Last 3 Encounters:  11/29/16 224 lb 3.2 oz (101.7 kg)  11/20/16 220 lb 12.8 oz (100.2 kg)  11/14/16 225 lb 6.4 oz (102.2 kg)

## 2016-11-29 NOTE — Telephone Encounter (Signed)
Ok to proceed with IV Orencia if approved.

## 2016-12-02 ENCOUNTER — Ambulatory Visit
Admission: RE | Admit: 2016-12-02 | Discharge: 2016-12-02 | Disposition: A | Discharge status: Home / Self Care | Payer: Medicare Other | Source: Ambulatory Visit | Attending: Radiation Oncology | Admitting: Radiation Oncology

## 2016-12-02 ENCOUNTER — Encounter: Payer: Self-pay | Admitting: Medical Oncology

## 2016-12-02 DIAGNOSIS — C61 Malignant neoplasm of prostate: Secondary | ICD-10-CM | POA: Diagnosis not present

## 2016-12-02 DIAGNOSIS — Z510 Encounter for antineoplastic radiation therapy: Secondary | ICD-10-CM | POA: Diagnosis not present

## 2016-12-02 NOTE — Telephone Encounter (Signed)
Patient stopped by the office and signed form.  I will run benefits verification for IV Orencia and advised patient I will contact him once I know the results.  Patient voiced understanding.

## 2016-12-02 NOTE — Telephone Encounter (Signed)
I called patient.  Advised him that I will need him to sign a form in order to fun an insurance verification.  Asked patient if he prefers to have form mailed to him or if he wants to come by the office to sign.  He reports he is in [place] today and will come by our office to sign the form.  Once I have the form I will run an insurance benefits verification.

## 2016-12-03 ENCOUNTER — Ambulatory Visit
Admission: RE | Admit: 2016-12-03 | Discharge: 2016-12-03 | Disposition: A | Discharge status: Home / Self Care | Payer: Medicare Other | Source: Ambulatory Visit | Attending: Radiation Oncology | Admitting: Radiation Oncology

## 2016-12-03 DIAGNOSIS — C61 Malignant neoplasm of prostate: Secondary | ICD-10-CM | POA: Diagnosis not present

## 2016-12-03 DIAGNOSIS — Z510 Encounter for antineoplastic radiation therapy: Secondary | ICD-10-CM | POA: Diagnosis not present

## 2016-12-03 NOTE — Telephone Encounter (Signed)
OPENED IN ERROR

## 2016-12-04 ENCOUNTER — Ambulatory Visit
Admission: RE | Admit: 2016-12-04 | Discharge: 2016-12-04 | Disposition: A | Discharge status: Home / Self Care | Payer: Medicare Other | Source: Ambulatory Visit | Attending: Radiation Oncology | Admitting: Radiation Oncology

## 2016-12-04 DIAGNOSIS — C61 Malignant neoplasm of prostate: Secondary | ICD-10-CM | POA: Diagnosis not present

## 2016-12-04 DIAGNOSIS — Z510 Encounter for antineoplastic radiation therapy: Secondary | ICD-10-CM | POA: Diagnosis not present

## 2016-12-05 ENCOUNTER — Ambulatory Visit
Admission: RE | Admit: 2016-12-05 | Discharge: 2016-12-05 | Disposition: A | Discharge status: Home / Self Care | Payer: Medicare Other | Source: Ambulatory Visit | Attending: Radiation Oncology | Admitting: Radiation Oncology

## 2016-12-05 VITALS — BP 134/85 | HR 81 | Resp 18 | Wt 230.6 lb

## 2016-12-05 DIAGNOSIS — Z510 Encounter for antineoplastic radiation therapy: Secondary | ICD-10-CM | POA: Diagnosis not present

## 2016-12-05 DIAGNOSIS — C61 Malignant neoplasm of prostate: Secondary | ICD-10-CM

## 2016-12-05 NOTE — Progress Notes (Signed)
  Radiation Oncology         [phone]   Name: ARYEN KISE MRN: [id number]   Date: 12/05/2016  DOB: [birth year]     Weekly Radiation Therapy Management    ICD-9-CM ICD-10-CM   1. Malignant neoplasm of prostate (HCC) 185 C61     Current Dose: 57.6 Gy  Planned Dose:  68.4 Gy  Narrative The patient presents for routine under treatment assessment.  Weight and vitals stable. Reports pain 7 on a scale of 0-10 associated with arthritis. He takes steroids and the arthritis pain improves through the day. Reports mild dysuria. Denies hematuria. Reports nocturia x 5-6. Continues to take Lasix. Describes a strong steady urine stream without difficulty emptying. Reports urinary urgency. Denies hesitancy. Denies diarrhea. Denies fatigue.   Set-up films were reviewed. The chart was checked.  Physical Findings  weight is 230 lb 9.6 oz (104.6 kg). His blood pressure is 134/85 and his pulse is 81. His respiration is 18 and oxygen saturation is 100%.   Alert and in no acute distress. The patient is wearing a mask and gloves.   Impression The patient is tolerating radiation.  Plan Continue treatment as planned.        Sheral Apley Tammi Klippel, M.D.  This document serves as a record of services personally performed by Tyler Pita, MD and Shona Simpson, PA-C. It was created on his behalf by Arlyce Harman, a trained medical scribe. The creation of this record is based on the scribe's personal observations and the provider's statements to them. This document has been checked and approved by the attending provider.

## 2016-12-05 NOTE — Progress Notes (Signed)
Weight and vitals stable. Reports pain 7 on a scale of 0-10 associated with arthritis. Reports mild dysuria. Denies hematuria. Reports nocturia x 5-6. Continues to take Lasix. Describes a strong steady urine stream without difficulty emptying. Reports urinary urgency. Denies hesitancy. Denies diarrhea. Denies fatigues.    BP 134/85 (BP Location: Left Arm, Patient Position: Sitting, Cuff Size: Normal)   Pulse 81   Resp 18   Wt 230 lb 9.6 oz (104.6 kg)   SpO2 100%   BMI 34.05 kg/m  Wt Readings from Last 3 Encounters:  12/05/16 230 lb 9.6 oz (104.6 kg)  11/29/16 224 lb 3.2 oz (101.7 kg)  11/20/16 220 lb 12.8 oz (100.2 kg)

## 2016-12-06 ENCOUNTER — Ambulatory Visit
Admission: RE | Admit: 2016-12-06 | Discharge: 2016-12-06 | Disposition: A | Discharge status: Home / Self Care | Payer: Medicare Other | Source: Ambulatory Visit | Attending: Radiation Oncology | Admitting: Radiation Oncology

## 2016-12-06 DIAGNOSIS — Z510 Encounter for antineoplastic radiation therapy: Secondary | ICD-10-CM | POA: Diagnosis not present

## 2016-12-06 DIAGNOSIS — C61 Malignant neoplasm of prostate: Secondary | ICD-10-CM | POA: Diagnosis not present

## 2016-12-09 ENCOUNTER — Telehealth: Payer: Self-pay | Admitting: Radiology

## 2016-12-09 ENCOUNTER — Encounter: Payer: Self-pay | Admitting: Medical Oncology

## 2016-12-09 ENCOUNTER — Other Ambulatory Visit: Payer: Self-pay | Admitting: Radiology

## 2016-12-09 ENCOUNTER — Ambulatory Visit
Admission: RE | Admit: 2016-12-09 | Discharge: 2016-12-09 | Disposition: A | Discharge status: Home / Self Care | Payer: Medicare Other | Source: Ambulatory Visit | Attending: Radiation Oncology | Admitting: Radiation Oncology

## 2016-12-09 DIAGNOSIS — C61 Malignant neoplasm of prostate: Secondary | ICD-10-CM | POA: Diagnosis not present

## 2016-12-09 DIAGNOSIS — Z510 Encounter for antineoplastic radiation therapy: Secondary | ICD-10-CM | POA: Diagnosis not present

## 2016-12-09 MED ORDER — PREDNISONE 10 MG PO TABS: ORAL_TABLET | ORAL | 0 refills | Status: DC

## 2016-12-09 NOTE — Telephone Encounter (Signed)
Called [employer] to verify patients application for Orencia IV. Representative Lattie Haw) informed me that the documents had been received but had not been verified. She will send the information to be verified by the committee.  [name], [place]

## 2016-12-10 ENCOUNTER — Ambulatory Visit
Admission: RE | Admit: 2016-12-10 | Discharge: 2016-12-10 | Disposition: A | Discharge status: Home / Self Care | Payer: Medicare Other | Source: Ambulatory Visit | Attending: Radiation Oncology | Admitting: Radiation Oncology

## 2016-12-10 DIAGNOSIS — C61 Malignant neoplasm of prostate: Secondary | ICD-10-CM | POA: Diagnosis not present

## 2016-12-10 DIAGNOSIS — Z510 Encounter for antineoplastic radiation therapy: Secondary | ICD-10-CM | POA: Diagnosis not present

## 2016-12-11 ENCOUNTER — Telehealth: Payer: Self-pay | Admitting: Pharmacist

## 2016-12-11 ENCOUNTER — Encounter: Payer: Self-pay | Admitting: Radiation Oncology

## 2016-12-11 ENCOUNTER — Ambulatory Visit
Admission: RE | Admit: 2016-12-11 | Discharge: 2016-12-11 | Disposition: A | Discharge status: Home / Self Care | Payer: Medicare Other | Source: Ambulatory Visit | Attending: Radiation Oncology | Admitting: Radiation Oncology

## 2016-12-11 ENCOUNTER — Other Ambulatory Visit: Payer: Self-pay | Admitting: Radiology

## 2016-12-11 VITALS — BP 156/87 | HR 88 | Temp 98.0°F | Resp 16 | Wt 230.4 lb

## 2016-12-11 DIAGNOSIS — C61 Malignant neoplasm of prostate: Secondary | ICD-10-CM | POA: Diagnosis not present

## 2016-12-11 DIAGNOSIS — M0579 Rheumatoid arthritis with rheumatoid factor of multiple sites without organ or systems involvement: Secondary | ICD-10-CM

## 2016-12-11 DIAGNOSIS — Z510 Encounter for antineoplastic radiation therapy: Secondary | ICD-10-CM | POA: Diagnosis not present

## 2016-12-11 DIAGNOSIS — Z9225 Personal history of immunosupression therapy: Secondary | ICD-10-CM

## 2016-12-11 MED ORDER — ACETAMINOPHEN 325 MG PO TABS: 650.0000 mg | ORAL_TABLET | Freq: Once | ORAL | Status: DC

## 2016-12-11 MED ORDER — DIPHENHYDRAMINE HCL 25 MG PO CAPS: 25.0000 mg | ORAL_CAPSULE | Freq: Once | ORAL | Status: DC

## 2016-12-11 MED ORDER — SODIUM CHLORIDE 0.9 % IV SOLN: 1000.0000 mg | INTRAVENOUS | Status: DC

## 2016-12-11 NOTE — Telephone Encounter (Signed)
Called patient to advise infusion orders sent, but he needs TB gold first, he has a sore throat will see PCP first before starting the infusions. He knows not to schedule infusion, until this is resolved. He states his joints are painful and the 15 mg prednisone is not helping with this, he wants to know if you will increase it.

## 2016-12-11 NOTE — Progress Notes (Signed)
Weight and vitals stable. Reports pain 7 on a scale of 0-10 associated with arthritis. Reports mild dysuria. Denies hematuria. Reports nocturia x 5-6. Continues to take Lasix. Describes a strong steady urine stream without difficulty emptying. Reports urinary urgency. Denies hesitancy. Denies diarrhea. Denies fatigues. Reports new onset of left side sore throat x 2 day. Requesting antibiotic to treat sore throat. One month follow up appointment card given.  BP (!) 156/87 (BP Location: Left Arm, Patient Position: Sitting, Cuff Size: Large)   Pulse 88   Resp 16   Wt 230 lb 6.4 oz (104.5 kg)   SpO2 100%   BMI 34.02 kg/m  Wt Readings from Last 3 Encounters:  12/11/16 230 lb 6.4 oz (104.5 kg)  12/05/16 230 lb 9.6 oz (104.6 kg)  11/29/16 224 lb 3.2 oz (101.7 kg)

## 2016-12-11 NOTE — Progress Notes (Signed)
  Radiation Oncology         [phone]   Name: [name] MRN: [id number]   Date: 12/11/2016  DOB: Oct 18, 1951     Weekly Radiation Therapy Management    ICD-9-CM ICD-10-CM   1. Malignant neoplasm of prostate (HCC) 185 C61     Current Dose: 64.8 Gy  Planned Dose:  68.4 Gy  Narrative The patient presents for routine under treatment assessment.  Weight and vitals stable. The patient denies fatigue. The patient reports pain 7/10 in severity associated with arthritis. He reports mild dysuria. Denies hematuria. He reports nocturia x 5-6. Continues to take Lasix. He describes a strong, steady urine stream without difficulty emptying. He reports urinary urgency. Denies hesitancy. Denies diarrhea. The patient reports new onset of left sided sore throat x 2 day. He is requesting an antibiotic to treat his sore throat.  Set-up films were reviewed. The chart was checked.  Physical Findings  weight is 230 lb 6.4 oz (104.5 kg). His oral temperature is 98 F (36.7 C). His blood pressure is 156/87 (abnormal) and his pulse is 88. His respiration is 16 and oxygen saturation is 100%.   Alert and in no acute distress.  Impression The patient is tolerating radiation. The patient is wearing mask and gloves today.  Plan Continue treatment as planned. The patient will follow up in one month after completing radiation therapy later this week.       Sheral Apley Tammi Klippel, M.D.  This document serves as a record of services personally performed by Tyler Pita, MD. It was created on his behalf by Maryla Morrow, a trained medical scribe. The creation of this record is based on the scribe's personal observations and the provider's statements to them. This document has been checked and approved by the attending provider.

## 2016-12-11 NOTE — Telephone Encounter (Signed)
He can increase to 20 mg by mouth daily. His blood sugar should be monitored either with PCP or at our office

## 2016-12-11 NOTE — Telephone Encounter (Signed)
Received Benefits Review for IV Orencia.  Patient has medicare and [place].  With his medicare, benefits subject to a $183 deductible which has been met for 2017 and 20% co-insurance for the administration and cost of Orencia.  The benefits review states that for [place] deductible, co-insurance and 100% of the excess charges.  Will upload the benefits review into patient's chart.

## 2016-12-12 ENCOUNTER — Ambulatory Visit
Admission: RE | Admit: 2016-12-12 | Discharge: 2016-12-12 | Disposition: A | Discharge status: Home / Self Care | Payer: Medicare Other | Source: Ambulatory Visit | Attending: Radiation Oncology | Admitting: Radiation Oncology

## 2016-12-12 ENCOUNTER — Other Ambulatory Visit: Payer: Self-pay | Admitting: *Deleted

## 2016-12-12 DIAGNOSIS — Z510 Encounter for antineoplastic radiation therapy: Secondary | ICD-10-CM | POA: Diagnosis not present

## 2016-12-12 DIAGNOSIS — Z9225 Personal history of immunosupression therapy: Secondary | ICD-10-CM | POA: Diagnosis not present

## 2016-12-12 DIAGNOSIS — C61 Malignant neoplasm of prostate: Secondary | ICD-10-CM | POA: Diagnosis not present

## 2016-12-12 MED ORDER — PREDNISONE 10 MG PO TABS
ORAL_TABLET | ORAL | 0 refills | Status: DC
Start: 2016-12-12 — End: 2016-12-31

## 2016-12-12 NOTE — Telephone Encounter (Signed)
Advised patient of increased dose of prednisone, sent in to pharmacy told him we will ck his glucose when he is here on 12/31/16

## 2016-12-13 ENCOUNTER — Encounter: Payer: Self-pay | Admitting: Radiation Oncology

## 2016-12-13 ENCOUNTER — Ambulatory Visit
Admission: RE | Admit: 2016-12-13 | Discharge: 2016-12-13 | Disposition: A | Discharge status: Home / Self Care | Payer: Medicare Other | Source: Ambulatory Visit | Attending: Radiation Oncology | Admitting: Radiation Oncology

## 2016-12-13 ENCOUNTER — Encounter: Payer: Self-pay | Admitting: Medical Oncology

## 2016-12-13 DIAGNOSIS — Z510 Encounter for antineoplastic radiation therapy: Secondary | ICD-10-CM | POA: Diagnosis not present

## 2016-12-13 DIAGNOSIS — C61 Malignant neoplasm of prostate: Secondary | ICD-10-CM | POA: Diagnosis not present

## 2016-12-13 NOTE — Progress Notes (Signed)
[name] celebrated by ringing the bell after completion of radiation. He has follow up appointment 2/06/018.

## 2016-12-15 NOTE — Progress Notes (Signed)
  Radiation Oncology         [phone] ________________________________  Name: [name] MRN: [id number]  Date: 12/13/2016  DOB: [birth year]  End of Treatment Note  Diagnosis:   65 yo man with detectable rising PSA of 0.1 s/p radical prostatectomy with no adverse pathology features     Indication for treatment:  Curative, Salvage Prostatic Fossa Radiotherapy       Radiation treatment dates:   10/22/16-12/13/16  Site/dose:   The prostatic fossa was treated to 68.4 Gy in 38 fractions of 1.8 Gy  Beams/energy:   The prostatic fossa was treated using helical intensity modulated radiotherapy delivering 6 megavolt photons. Image guidance was performed with megavoltage CT studies prior to each fraction. He was immobilized with a body fix lower extremity mold.  Narrative: The patient tolerated radiation treatment relatively well.   Reports pain 7 on a scale of 0-10 associated with arthritis. Reports mild dysuria. Denies hematuria. Reports nocturia x 5-6. Continues to take Lasix. Describes a strong steady urine stream without difficulty emptying. Reports urinary urgency. Denies hesitancy. Denies diarrhea.  Plan: The patient has completed radiation treatment. He will return to radiation oncology clinic for routine followup in one month. I advised him to call or return sooner if he has any questions or concerns related to his recovery or treatment. ________________________________  Sheral Apley. Tammi Klippel, M.D.

## 2016-12-16 LAB — QUANTIFERON TB GOLD ASSAY (BLOOD)
Mitogen-Nil: 0.21 IU/mL
Quantiferon Nil Value: 0.91 IU/mL
Quantiferon Tb Ag Minus Nil Value: <0 IU/mL

## 2016-12-17 NOTE — Progress Notes (Signed)
Repeat TB gold

## 2016-12-19 ENCOUNTER — Other Ambulatory Visit: Payer: Self-pay | Admitting: *Deleted

## 2016-12-19 ENCOUNTER — Telehealth: Payer: Self-pay | Admitting: Rheumatology

## 2016-12-19 DIAGNOSIS — Z9225 Personal history of immunosupression therapy: Secondary | ICD-10-CM

## 2016-12-19 NOTE — Telephone Encounter (Signed)
Attempted to contact the patient and left message for patient to call the office.  

## 2016-12-19 NOTE — Telephone Encounter (Signed)
Patient came to office for TB Gold. Patient had no additional questions when in office.

## 2016-12-19 NOTE — Telephone Encounter (Signed)
Patient left voicemail requesting that Amy please call him back.

## 2016-12-20 ENCOUNTER — Telehealth: Payer: Self-pay | Admitting: Rheumatology

## 2016-12-20 NOTE — Telephone Encounter (Signed)
I called patient but his answering machine was full. If he calls back about increase his prednisone to 5 mg by mouth daily to give some relief. Please schedule a follow-up appointment for him.

## 2016-12-20 NOTE — Telephone Encounter (Signed)
Patient is requesting [name] or [name] to please call him.

## 2016-12-20 NOTE — Telephone Encounter (Signed)
Patient state he is having pain in his hands, wrist and neck. Patient states the left hand is hurting more than the right. Patient has noticed some swelling.

## 2016-12-24 LAB — QUANTIFERON TB GOLD ASSAY (BLOOD)
Mitogen-Nil: 0.11 IU/mL
Quantiferon Nil Value: 0.63 IU/mL
Quantiferon Tb Ag Minus Nil Value: 0.03 IU/mL

## 2016-12-25 DIAGNOSIS — Z7901 Long term (current) use of anticoagulants: Secondary | ICD-10-CM | POA: Diagnosis not present

## 2016-12-25 NOTE — Progress Notes (Signed)
Office Visit Note  Patient: [name]             Date of Birth: 1951-11-01           MRN: [id number]             PCP: Purvis Kilts, MD Referring: [name], MD Visit Date: 12/31/2016 Occupation: @GUAROCC @    Subjective:  Pain and swelling in hands   History of Present Illness: [name] is a 65 y.o. male with history of rheumatoid arthritis. He continues to have multiple flares of rheumatoid arthritis with pain and swelling in his bilateral hands, bilateral knees, bilateral ankles and bilateral feet. He has lot of the stiffness in his neck. He had been on prednisone anywhere from 20-30 mg. His most recent dose was 25 mg after Christmas and then he has cut down it to 20 mg a day. He is still continues to have a lot of pain and swelling. His last radiation therapy was on December 15. He is awaiting PPD test as his true TB test were indeterminate. We will restart his Orencia IV infusions right after his PPD results. He has done well on IV Orencia in the past.  Activities of Daily Living:  Patient reports morning stiffness forall day  hours.   Patient Reports nocturnal pain.  Difficulty dressing/grooming: Reports Difficulty climbing stairs: Denies Difficulty getting out of chair: Denies Difficulty using hands for taps, buttons, cutlery, and/or writing: Reports   Review of Systems  Constitutional: Positive for fatigue and weight gain. Negative for night sweats and weakness ( ).  HENT: Negative for mouth sores, mouth dryness and nose dryness.   Eyes: Negative for redness and dryness.  Respiratory: Negative for shortness of breath and difficulty breathing.   Cardiovascular: Negative for chest pain, palpitations, hypertension, irregular heartbeat and swelling in legs/feet.  Gastrointestinal: Negative for constipation and diarrhea.  Endocrine: Negative for increased urination.  Musculoskeletal: Positive for arthralgias, joint pain, joint swelling and morning  stiffness. Negative for myalgias, muscle weakness, muscle tenderness and myalgias.  Skin: Negative for color change, rash, hair loss, nodules/bumps, skin tightness, ulcers and sensitivity to sunlight.  Allergic/Immunologic: Negative for susceptible to infections.  Neurological: Negative for dizziness, fainting, memory loss and night sweats.  Hematological: Negative for swollen glands.  Psychiatric/Behavioral: Positive for sleep disturbance. Negative for depressed mood. The patient is not nervous/anxious.     PMFS History:  Patient Active Problem List   Diagnosis Date Noted  . High risk medication use 12/29/2016  . History of prostate cancer 12/29/2016  . Primary osteoarthritis of both knees 12/29/2016  . Chronic idiopathic gout involving toe without tophus 12/29/2016  . History of CHF (congestive heart failure) 12/29/2016  . History of COPD 12/29/2016  . Plantar pustular psoriasis 12/29/2016  . DVT (deep venous thrombosis) ([place]) 10/31/2016  . COPD (chronic obstructive pulmonary disease) ([place]) 10/31/2016  . CHF (congestive heart failure) ([place]) 10/31/2016  . Prostate cancer ([place]) 08/30/2015  . Malignant neoplasm of prostate ([place]) 07/04/2015  . Chest pain at rest 07/05/2014  . Weakness 07/05/2014  . Complicated postphlebitic syndrome 11/16/2013  . Personal history of DVT (deep vein thrombosis) 05/18/2013  . Chronic anticoagulation 05/18/2013  . Diverticulosis of colon without hemorrhage 05/18/2013  . Rheumatoid arthritis ([place]) 05/18/2013  . Seizure disorder ([place]) 05/18/2013  . Hx of adenomatous colonic polyps 05/18/2013  . GERD 05/08/2010  . NAUSEA 05/08/2010  . FLATULENCE-GAS-BLOATING 05/08/2010    Past Medical History:  Diagnosis Date  .  Anxiety    hx of   . Arthritis   . Asthma   . Clotting disorder (HCC)    DVT both legs   . COPD (chronic obstructive pulmonary disease) ([place])   . DDD (degenerative disc disease), cervical    with UE's paresthesias  . Diverticulosis   .  DVT, lower extremity ([place])    bilat  . 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 GERD (gastroesophageal reflux disease)   . Gout   . Heart murmur   . History of measles   . History of shingles   . Hypercholesterolemia   . IBS (irritable bowel syndrome)   . IBS (irritable bowel syndrome)   . Peripheral edema   . Pneumonia    hx of   . Prostate cancer ([place])   . Seizures ([place])    last seizure 20 years ago   . Shortness of breath dyspnea    exertion   . Sleep apnea    not on cpap  . Tubular adenoma of colon 04/2008    Family History  Problem Relation Age of Onset  . Skin cancer Father   . Stomach cancer Father   . Heart attack Father   . Alzheimer's disease Mother   . Throat cancer Brother   . Stomach cancer Brother   . Lung cancer Brother   . Heart attack Brother   . Heart attack Brother   . Breast cancer Sister   . Heart attack Sister   . Stroke Sister   . Bladder Cancer Sister   . Heart murmur Sister   . Colon cancer Neg Hx   . Colon polyps Neg Hx    Past Surgical History:  Procedure Laterality Date  . CHOLECYSTECTOMY    . COLONOSCOPY    . DOPPLER ECHOCARDIOGRAPHY N/A 01-21-2012   TECHNICALLY DIFFICULT. MILD CONCENTRIC LV HYPERTROPHY. LV CAVITY IS SMALL.. EF=> 55%. TRANSMITRAL SPECTRAL FLOW PATTREN IS SUGGESTIVE OF IMPAIRED LV RELAXATION. RV SYSTOLIC PRESSURE IS [id number]. LEFT ATRIAL SIZE IS NORMAL. AV APPEARS MILDLY SCLEROTIC. NO SIGN VALVE DISEASE NOTED.  [name] LYMPHADENECTOMY Bilateral 08/30/2015   Procedure: PELVIC LYMPHADENECTOMY;  Surgeon: [name], MD;  Location: [facility];  Service: Urology;  Laterality: Bilateral;  . NUCLEAR STRESS TEST N/A 01-21-2012   NORMAL PATTERN OF PERFUSION IN ALL REGIONS. POST STRESS LV SIZE IS NORMAL. NO EVIDENCE OF INDUCIBLE ISCHEMIA. EF 54%.  [name] POLYPECTOMY    . PROSTATE BIOPSY    . ROBOT ASSISTED LAPAROSCOPIC RADICAL PROSTATECTOMY N/A 08/30/2015   Procedure: ROBOTIC  ASSISTED LAPAROSCOPIC RADICAL PROSTATECTOMY;  Surgeon: [name], MD;  Location: [facility];  Service: Urology;  Laterality: N/A;  . US VENOUS LOWER EXT Right 03/07/11   PERSISTENT DVT IN RIGHT LOWER EXT.WITH PERSISTANT VISUALIZATION OF HYPOECHOIC THROMBUS WITHIN THE FEMORAL, PROFUNDA FEMORAL AND POPLITEAL VEINS. WHEN COMPARED TO PREVIOUS, CLOT IS NO LONGER IDENTIFIED WITH IN THE RIGHT CFV.   Social History   Social History Narrative  . No narrative on file     Objective: Vital Signs: BP 139/76 (BP Location: Right Arm, Patient Position: Sitting, Cuff Size: Normal)   Pulse 82   Resp 16   Ht 5\' 9"  (1.753 m)   Wt 231 lb (104.8 kg)   BMI 34.11 kg/m    Physical Exam  Constitutional: He is oriented to person, place, and time. He appears well-developed and well-nourished.  HENT:  Head: Normocephalic and atraumatic.  Eyes: Conjunctivae and EOM are normal. Pupils are equal, round, and reactive to light.  Neck: Normal range of motion. Neck supple.  Cardiovascular: Normal rate, regular rhythm and normal heart sounds.   Pulmonary/Chest: Effort normal and breath sounds normal.  Abdominal: Soft. Bowel sounds are normal.  Neurological: He is alert and oriented to person, place, and time.  Skin: Skin is warm and dry. Capillary refill takes less than 2 seconds.  Psychiatric: He has a normal mood and affect. His behavior is normal.  Nursing note and vitals reviewed.    Musculoskeletal Exam: Limited painful range of motion of his C-spine, thoracic and lumbar spine good range of motion. Shoulder joints elbow joints painful range of motion. He has synovitis on examination of bilateral wrists joint MCPs and PIP joints with tenderness. He had painful range of motion of his left hip joints bilateral knee joints and ankles. He had some warmth on palpation over bilateral knee joints and ankle joints and tenderness across his MTP joints.  CDAI Exam: CDAI Homunculus Exam:   Tenderness:  RUE:  glenohumeral and wrist LUE: glenohumeral and wrist Right hand: 2nd MCP, 4th MCP, 5th MCP, 3rd PIP, 4th PIP and 5th PIP Left hand: 2nd MCP, 3rd MCP, 2nd PIP, 3rd PIP and 4th PIP RLE: tibiofemoral and tibiotalar LLE: acetabulofemoral, tibiofemoral and tibiotalar Right foot: 1st MTP, 2nd MTP, 3rd MTP, 4th MTP and 5th MTP Left foot: 1st MTP, 2nd MTP, 3rd MTP, 4th MTP and 5th MTP  Swelling:  Right hand: 4th MCP, 5th MCP, 3rd PIP and 4th PIP Left hand: 2nd MCP, 3rd MCP, 2nd PIP and 4th PIP RLE: tibiofemoral and tibiotalar LLE: tibiofemoral and tibiotalar  Joint Counts:  CDAI Tender Joint count: 17 CDAI Swollen Joint count: 10  Global Assessments:  Patient Global Assessment: 7 Provider Global Assessment: 7  CDAI Calculated Score: 41    Investigation: Findings:  January 2015: TB negative, June 2011 hepatitis panel negative, chest x-ray normal  Orders Only on 12/19/2016  Component Date Value Ref Range Status  . Interferon Gamma Release Assay 12/24/2016 INDETERM* NEGATIVE Final   Comment: Results are indeterminate for response to ESAT-6,TB7.7 and/or CFP-10 test antigens.   . Quantiferon Nil Value 12/24/2016 0.63  IU/mL Final  . Mitogen-Nil 12/24/2016 0.11  IU/mL Final  . Quantiferon Tb Ag Minus Nil Value 12/24/2016 0.03  IU/mL Final   Comment:   The Nil tube value is used to determine if the patient has a preexisting immune response which could cause a false-positive reading on the test. In order for a test to be valid, the Nil tube must have a value of less than or equal to 8.0 IU/mL.   The mitogen control tube is used to assure the patient has a healthy immune status and also serves as a control for correct blood handling and incubation. It is used to detect false-negative readings. The mitogen tube must have a gamma interferon value of greater than or equal to 0.5 IU/mL higher than the value of the Nil tube.   The TB antigen tube is coated with the M. tuberculosis  specific antigens. For a test to be considered positive, the TB antigen tube value minus the Nil tube value must be greater than or equal to 0.35 IU/mL.   For additional information, please refer to http://education.questdiagn[facility].com/faq/QFT (This link is being provided for informational/educational purposes only.)   Orders Only on 12/12/2016  Component Date Value Ref Range Status  . Interferon Gamma Release Assay 12/16/2016 INDETERM* NEGATIVE Final  Comment: Results are indeterminate for response to ESAT-6,TB7.7 and/or CFP-10 test antigens. Result repeated and verified.   . Quantiferon Nil Value 12/16/2016 0.91  IU/mL Final  . Mitogen-Nil 12/16/2016 0.21  IU/mL Final  . Quantiferon Tb Ag Minus Nil Value 12/16/2016 <0.00  IU/mL Final   Comment:   The Nil tube value is used to determine if the patient has a preexisting immune response which could cause a false-positive reading on the test. In order for a test to be valid, the Nil tube must have a value of less than or equal to 8.0 IU/mL.   The mitogen control tube is used to assure the patient has a healthy immune status and also serves as a control for correct blood handling and incubation. It is used to detect false-negative readings. The mitogen tube must have a gamma interferon value of greater than or equal to 0.5 IU/mL higher than the value of the Nil tube.   The TB antigen tube is coated with the M. tuberculosis specific antigens. For a test to be considered positive, the TB antigen tube value minus the Nil tube value must be greater than or equal to 0.35 IU/mL.   For additional information, please refer to http://education.questdiagn[facility].com/faq/QFT (This link is being provided for informational/educational purposes only.)   Orders Only on 11/25/2016  Component Date Value Ref Range Status  . WBC 11/26/2016 8.5  3.4 - 10.8 x10E3/uL Final  . RBC 11/26/2016 4.98  4.14 - 5.80 x10E6/uL Final  . Hemoglobin  11/26/2016 14.7  12.6 - 17.7 g/dL Final   Comment: **Effective December 02, 2016 the reference interval**   for Hemoglobin MALES only will be changing to:                         Males 13-15 years: 12.6 - 17.7                         Males   >15 years: 13.0 - 17.7   . Hematocrit 11/26/2016 43.4  37.5 - 51.0 % Final  . MCV 11/26/2016 87  79 - 97 fL Final  . MCH 11/26/2016 29.5  26.6 - 33.0 pg Final  . MCHC 11/26/2016 33.9  31.5 - 35.7 g/dL Final  . RDW 11/26/2016 16.0* 12.3 - 15.4 % Final  . Platelets 11/26/2016 213  150 - 379 x10E3/uL Final  . Neutrophils 11/26/2016 71  Not Estab. % Final  . Lymphs 11/26/2016 11  Not Estab. % Final  . Monocytes 11/26/2016 12  Not Estab. % Final  . Eos 11/26/2016 4  Not Estab. % Final  . Basos 11/26/2016 1  Not Estab. % Final  . Neutrophils Absolute 11/26/2016 6.1  1.4 - 7.0 x10E3/uL Final  . Lymphocytes Absolute 11/26/2016 0.9  0.7 - 3.1 x10E3/uL Final  . Monocytes Absolute 11/26/2016 1.1* 0.1 - 0.9 x10E3/uL Final  . EOS (ABSOLUTE) 11/26/2016 0.4  0.0 - 0.4 x10E3/uL Final  . Basophils Absolute 11/26/2016 0.1  0.0 - 0.2 x10E3/uL Final  . Immature Granulocytes 11/26/2016 1  Not Estab. % Final  . Immature Grans (Abs) 11/26/2016 0.0  0.0 - 0.1 x10E3/uL Final  . Glucose 11/26/2016 103* 65 - 99 mg/dL Final  . BUN 11/26/2016 13  8 - 27 mg/dL Final  . Creatinine, Ser 11/26/2016 0.91  0.76 - 1.27 mg/dL Final  . GFR calc non Af Amer 11/26/2016 88  >59 mL/min/1.73 Final  . GFR calc Af Wyvonnia Lora  11/26/2016 101  >59 mL/min/1.73 Final  . BUN/Creatinine Ratio 11/26/2016 14  10 - 24 Final  . Sodium 11/26/2016 144  134 - 144 mmol/L Final  . Potassium 11/26/2016 4.0  3.5 - 5.2 mmol/L Final  . Chloride 11/26/2016 97  96 - 106 mmol/L Final  . CO2 11/26/2016 31* 18 - 29 mmol/L Final  . Calcium 11/26/2016 9.3  8.6 - 10.2 mg/dL Final  . Total Protein 11/26/2016 6.6  6.0 - 8.5 g/dL Final  . Albumin 11/26/2016 3.8  3.6 - 4.8 g/dL Final  . Globulin, Total 11/26/2016 2.8  1.5 -  4.5 g/dL Final  . Albumin/Globulin Ratio 11/26/2016 1.4  1.2 - 2.2 Final  . Bilirubin Total 11/26/2016 0.4  0.0 - 1.2 mg/dL Final  . Alkaline Phosphatase 11/26/2016 95  39 - 117 IU/L Final  . AST 11/26/2016 22  0 - 40 IU/L Final  . ALT 11/26/2016 32  0 - 44 IU/L Final  Orders Only on 11/13/2016  Component Date Value Ref Range Status  . WBC 11/14/2016 10.5  3.4 - 10.8 x10E3/uL Final  . RBC 11/14/2016 4.75  4.14 - 5.80 x10E6/uL Final  . Hemoglobin 11/14/2016 14.0  12.6 - 17.7 g/dL Final  . Hematocrit 11/14/2016 41.3  37.5 - 51.0 % Final  . MCV 11/14/2016 87  79 - 97 fL Final  . MCH 11/14/2016 29.5  26.6 - 33.0 pg Final  . MCHC 11/14/2016 33.9  31.5 - 35.7 g/dL Final  . RDW 11/14/2016 15.8* 12.3 - 15.4 % Final  . Platelets 11/14/2016 234  150 - 379 x10E3/uL Final  . Neutrophils 11/14/2016 92  Not Estab. % Final  . Lymphs 11/14/2016 4  Not Estab. % Final  . Monocytes 11/14/2016 4  Not Estab. % Final  . Eos 11/14/2016 0  Not Estab. % Final  . Basos 11/14/2016 0  Not Estab. % Final  . Neutrophils Absolute 11/14/2016 9.7* 1.4 - 7.0 x10E3/uL Final  . Lymphocytes Absolute 11/14/2016 0.4* 0.7 - 3.1 x10E3/uL Final  . Monocytes Absolute 11/14/2016 0.4  0.1 - 0.9 x10E3/uL Final  . EOS (ABSOLUTE) 11/14/2016 0.0  0.0 - 0.4 x10E3/uL Final  . Basophils Absolute 11/14/2016 0.0  0.0 - 0.2 x10E3/uL Final  . Immature Granulocytes 11/14/2016 0  Not Estab. % Final  . Immature Grans (Abs) 11/14/2016 0.0  0.0 - 0.1 x10E3/uL Final  . Glucose 11/14/2016 184* 65 - 99 mg/dL Final  . BUN 11/14/2016 14  8 - 27 mg/dL Final  . Creatinine, Ser 11/14/2016 0.93  0.76 - 1.27 mg/dL Final  . GFR calc non Af Amer 11/14/2016 85  >59 mL/min/1.73 Final  . GFR calc Af Amer 11/14/2016 99  >59 mL/min/1.73 Final  . BUN/Creatinine Ratio 11/14/2016 15  10 - 24 Final  . Sodium 11/14/2016 138  134 - 144 mmol/L Final  . Potassium 11/14/2016 4.6  3.5 - 5.2 mmol/L Final  . Chloride 11/14/2016 95* 96 - 106 mmol/L Final  . CO2  11/14/2016 28  18 - 29 mmol/L Final  . Calcium 11/14/2016 9.0  8.6 - 10.2 mg/dL Final  . Total Protein 11/14/2016 6.8  6.0 - 8.5 g/dL Final  . Albumin 11/14/2016 3.6  3.6 - 4.8 g/dL Final  . Globulin, Total 11/14/2016 3.2  1.5 - 4.5 g/dL Final  . Albumin/Globulin Ratio 11/14/2016 1.1* 1.2 - 2.2 Final  . Bilirubin Total 11/14/2016 0.4  0.0 - 1.2 mg/dL Final  . Alkaline Phosphatase 11/14/2016 99  39 - 117 IU/L Final  .  AST 11/14/2016 34  0 - 40 IU/L Final  . ALT 11/14/2016 48* 0 - 44 IU/L Final     Imaging: No results found.  Speciality Comments: No specialty comments available.    Procedures:  No procedures performed Allergies: Levofloxacin; Orencia [abatacept]; Carbamazepine; Celecoxib; Cephalexin; Enbrel [etanercept]; Humira [adalimumab]; and Sulfa antibiotics   Assessment / Plan:     Visit Diagnoses: Rheumatoid arthritis involving multiple sites with positive rheumatoid factor (HCC) - Positive RF, positive anti-CCP, severe disease. He is still having synovitis in multiple joints despite being on prednisone 20 mg a day. He states he does better on 25 mg by mouth daily.  High risk medication use - Arava 20 mg poqd, Prednisone 20 mg qd , plan Orencia infusions after PPD results. He had to indeterminate TB gold test. His labs will be checked with the Orencia infusions which will be in a month then every 3 months.  Long-term use of prednisone. I would like to taper his prednisone as soon as possible after he starts Orencia infusions. We also discussed getting bone density to evaluate bone mass due to prednisone use.  History of prostate cancer - Status post prostatectomy and lymph node resection, radiation therapy. His last radiation therapy was 3 weeks ago per patient.  Primary osteoarthritis of both knees: Causes chronic pain  Gout - Allopurinol 200 mg po qd. He has not had any flares.  Personal history of DVT (deep vein thrombosis) - on warfarin  Diverticulosis of colon without  hemorrhage  History of CHF (congestive heart failure): Stable per patient  History of COPD mild, he has had no exacerbations on Orencia in the past.  Plantar pustular psoriasis  Seizure disorder ([place])  Gastroesophageal reflux disease without esophagitis    Orders: No orders of the defined types were placed in this encounter.  No orders of the defined types were placed in this encounter.   Face-to-face time spent with patient was 35 minutes. 50% of time was spent in counseling and coordination of care.  Follow-Up Instructions: Return in about 3 months (around 03/31/2017) for Rheumatoid arthritis.   Bo Merino, MD

## 2016-12-27 NOTE — Telephone Encounter (Signed)
Patient has been advised of recommendations. Patient states he is already on Prednisone 20 mg. He has a follow up appointment 12/31/16.

## 2016-12-29 DIAGNOSIS — Z8546 Personal history of malignant neoplasm of prostate: Secondary | ICD-10-CM | POA: Insufficient documentation

## 2016-12-29 DIAGNOSIS — L403 Pustulosis palmaris et plantaris: Secondary | ICD-10-CM | POA: Insufficient documentation

## 2016-12-29 DIAGNOSIS — Z8679 Personal history of other diseases of the circulatory system: Secondary | ICD-10-CM | POA: Insufficient documentation

## 2016-12-29 DIAGNOSIS — Z8709 Personal history of other diseases of the respiratory system: Secondary | ICD-10-CM | POA: Insufficient documentation

## 2016-12-29 DIAGNOSIS — M170 Bilateral primary osteoarthritis of knee: Secondary | ICD-10-CM | POA: Insufficient documentation

## 2016-12-29 DIAGNOSIS — Z79899 Other long term (current) drug therapy: Secondary | ICD-10-CM | POA: Insufficient documentation

## 2016-12-29 DIAGNOSIS — M1A0790 Idiopathic chronic gout, unspecified ankle and foot, without tophus (tophi): Secondary | ICD-10-CM | POA: Insufficient documentation

## 2016-12-29 NOTE — Progress Notes (Signed)
He he will need PPD as he has two indeterminate TB: Results

## 2016-12-31 ENCOUNTER — Ambulatory Visit (INDEPENDENT_AMBULATORY_CARE_PROVIDER_SITE_OTHER): Payer: Medicare Other | Admitting: Rheumatology

## 2016-12-31 ENCOUNTER — Encounter: Payer: Self-pay | Admitting: Rheumatology

## 2016-12-31 VITALS — BP 139/76 | HR 82 | Resp 16 | Ht 69.0 in | Wt 231.0 lb

## 2016-12-31 DIAGNOSIS — Z79899 Other long term (current) drug therapy: Secondary | ICD-10-CM

## 2016-12-31 DIAGNOSIS — M170 Bilateral primary osteoarthritis of knee: Secondary | ICD-10-CM

## 2016-12-31 DIAGNOSIS — L408 Other psoriasis: Secondary | ICD-10-CM | POA: Diagnosis not present

## 2016-12-31 DIAGNOSIS — K5730 Diverticulosis of large intestine without perforation or abscess without bleeding: Secondary | ICD-10-CM

## 2016-12-31 DIAGNOSIS — Z8709 Personal history of other diseases of the respiratory system: Secondary | ICD-10-CM

## 2016-12-31 DIAGNOSIS — Z86718 Personal history of other venous thrombosis and embolism: Secondary | ICD-10-CM | POA: Diagnosis not present

## 2016-12-31 DIAGNOSIS — K219 Gastro-esophageal reflux disease without esophagitis: Secondary | ICD-10-CM

## 2016-12-31 DIAGNOSIS — G40909 Epilepsy, unspecified, not intractable, without status epilepticus: Secondary | ICD-10-CM | POA: Diagnosis not present

## 2016-12-31 DIAGNOSIS — M0579 Rheumatoid arthritis with rheumatoid factor of multiple sites without organ or systems involvement: Secondary | ICD-10-CM

## 2016-12-31 DIAGNOSIS — Z8546 Personal history of malignant neoplasm of prostate: Secondary | ICD-10-CM

## 2016-12-31 DIAGNOSIS — Z8679 Personal history of other diseases of the circulatory system: Secondary | ICD-10-CM

## 2016-12-31 DIAGNOSIS — L403 Pustulosis palmaris et plantaris: Secondary | ICD-10-CM

## 2016-12-31 DIAGNOSIS — Z111 Encounter for screening for respiratory tuberculosis: Secondary | ICD-10-CM | POA: Diagnosis not present

## 2016-12-31 DIAGNOSIS — M1A0790 Idiopathic chronic gout, unspecified ankle and foot, without tophus (tophi): Secondary | ICD-10-CM

## 2016-12-31 MED ORDER — PREDNISONE 10 MG PO TABS: ORAL_TABLET | ORAL | 0 refills | Status: DC

## 2016-12-31 NOTE — Progress Notes (Signed)
Pharmacy Note  Subjective: Patient presents today to the [facility] to see Dr. Estanislado Pandy.  Patient seen by the pharmacist for counseling on intravenous Orencia.    Objective: CBC    Component Value Date/Time   WBC 8.5 11/25/2016 0824   WBC 11.5 (H) 09/01/2015 0715   RBC 4.98 11/25/2016 0824   RBC 3.86 (L) 09/01/2015 0715   HGB 11.9 (L) 09/01/2015 0715   HCT 43.4 11/25/2016 0824   PLT 213 11/25/2016 0824   MCV 87 11/25/2016 0824   MCH 29.5 11/25/2016 0824   MCH 30.8 09/01/2015 0715   MCHC 33.9 11/25/2016 0824   MCHC 33.1 09/01/2015 0715   RDW 16.0 (H) 11/25/2016 0824   LYMPHSABS 0.9 11/25/2016 0824   MONOABS 1.0 04/13/2014 1117   EOSABS 0.4 11/25/2016 0824   BASOSABS 0.1 11/25/2016 0824    CMP     Component Value Date/Time   NA 144 11/25/2016 0824   K 4.0 11/25/2016 0824   CL 97 11/25/2016 0824   CO2 31 (H) 11/25/2016 0824   GLUCOSE 103 (H) 11/25/2016 0824   GLUCOSE 104 (H) 09/01/2015 0715   BUN 13 11/25/2016 0824   CREATININE 0.91 11/25/2016 0824   CALCIUM 9.3 11/25/2016 0824   PROT 6.6 11/25/2016 0824   ALBUMIN 3.8 11/25/2016 0824   AST 22 11/25/2016 0824   ALT 32 11/25/2016 0824   ALKPHOS 95 11/25/2016 0824   BILITOT 0.4 11/25/2016 0824   GFRNONAA 88 11/25/2016 0824   GFRAA 101 11/25/2016 0824    TB Gold: indeterminate twice, gave patient order for TB Skin test today Hepatitis panel: negative (06/07/2010)   Assessment/Plan:  Counseled patient that Maureen Chatters is a selective T-cell costimulation blocker indicated for rheumatoid arthritis.  Counseled patient on purpose, proper use, and adverse effects of Orencia. The most common adverse effects are increased risk of infections, headache, and infusion reactions.  There is the possibility of an increased risk of malignancy but it is not well understood if this increased risk is due to the medication or the disease state.  Provided patient with medication education material and answered all questions.   Patient signed Orencia consent on 12/16/16.  Patient is aware that he needs to have TB Skin test performed and read prior to Orencia infusions.  Patient voiced understanding.     Elisabeth Most, Pharm.D., BCPS Clinical Pharmacist Pager: [phone] Phone: [phone] 12/31/2016 12:33 PM

## 2017-01-02 ENCOUNTER — Other Ambulatory Visit: Payer: Self-pay | Admitting: *Deleted

## 2017-01-02 ENCOUNTER — Telehealth: Payer: Self-pay | Admitting: Rheumatology

## 2017-01-02 DIAGNOSIS — M0579 Rheumatoid arthritis with rheumatoid factor of multiple sites without organ or systems involvement: Secondary | ICD-10-CM

## 2017-01-02 LAB — TB SKIN TEST: TB Skin Test: NEGATIVE

## 2017-01-02 NOTE — Telephone Encounter (Signed)
Patient called to see if the results from his TB test were received and if he is to schedule infusions? Patient is requesting a call back today.

## 2017-01-02 NOTE — Telephone Encounter (Signed)
Received PPD and results are negative. Patient would like to know if he can schedule his infusion?

## 2017-01-02 NOTE — Telephone Encounter (Signed)
Yes he may schedule infusion

## 2017-01-02 NOTE — Telephone Encounter (Signed)
Patient advised we  Have not received his test results for the TB skin test as of yet. Patient provided with the new fax number and he states he will have the doctor's office refax the results.

## 2017-01-03 NOTE — Telephone Encounter (Signed)
Patient advised he may schedule his infusion and orders placed.

## 2017-01-04 ENCOUNTER — Other Ambulatory Visit: Payer: Self-pay | Admitting: Rheumatology

## 2017-01-06 ENCOUNTER — Encounter: Payer: Self-pay | Admitting: Cardiovascular Disease

## 2017-01-06 ENCOUNTER — Ambulatory Visit (INDEPENDENT_AMBULATORY_CARE_PROVIDER_SITE_OTHER): Payer: Medicare Other | Admitting: Cardiovascular Disease

## 2017-01-06 VITALS — BP 138/70 | HR 73 | Ht 69.0 in | Wt 228.6 lb

## 2017-01-06 DIAGNOSIS — M0579 Rheumatoid arthritis with rheumatoid factor of multiple sites without organ or systems involvement: Secondary | ICD-10-CM

## 2017-01-06 DIAGNOSIS — J449 Chronic obstructive pulmonary disease, unspecified: Secondary | ICD-10-CM | POA: Diagnosis not present

## 2017-01-06 DIAGNOSIS — Z7901 Long term (current) use of anticoagulants: Secondary | ICD-10-CM

## 2017-01-06 DIAGNOSIS — I87099 Postthrombotic syndrome with other complications of unspecified lower extremity: Secondary | ICD-10-CM | POA: Diagnosis not present

## 2017-01-06 NOTE — Progress Notes (Signed)
Patient ID: [name], male   DOB: 15-Nov-1951, 66 y.o.   MRN: [id number]    Cardiology Office Note    Date:  01/07/2017   ID:  [name], DOB 29-Sep-1951, MRN [id number]  PCP:  Purvis Kilts, MD  Cardiologist:   [name], MD   Chief Complaint  Patient presents with  . Follow-up    pt co doe    History of Present Illness:  [name] is a 66 y.o. male  History of recurrent right lower extremity deep venous thrombosis and postphlebitic syndrome. Since his last appointment he had a small posttraumatic ulcer in his right pretibial area that thankfully healed with conservative management. He has also been diagnosed with prostate cancer and underwent prostatectomy about 16 months ago.  Almost one year after surgery his PSA began to increase and he had to undergo radiation therapy for 3 months, which he has just completed. After the diagnosis of cancer his long-term rheumatoid arthritis biological agent Orencia was discontinued and his joint symptoms became markedly worse so he has been taking a much higher dose of prednisone for the last 3-1/2 months.   He has been less active and has gained some weight. He has mild exertional dyspnea. His leg edema is generally well controlled, although he is still unable to put on compression stockings. He has not had any new skin ulcers.   Past Medical History:  Diagnosis Date  . Anxiety    hx of   . Arthritis   . Asthma   . Clotting disorder (HCC)    DVT both legs   . COPD (chronic obstructive pulmonary disease) ([place])   . DDD (degenerative disc disease), cervical    with UE's paresthesias  . Diverticulosis   . DVT, lower extremity ([place])    bilat  . 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 GERD (gastroesophageal reflux disease)   . Gout   . Heart murmur   . History of measles   . History of shingles   . Hypercholesterolemia   . IBS (irritable bowel syndrome)    . IBS (irritable bowel syndrome)   . Peripheral edema   . Pneumonia    hx of   . Prostate cancer ([place])   . Seizures ([place])    last seizure 20 years ago   . Shortness of breath dyspnea    exertion   . Sleep apnea    not on cpap  . Tubular adenoma of colon 04/2008    Past Surgical History:  Procedure Laterality Date  . CHOLECYSTECTOMY    . COLONOSCOPY    . DOPPLER ECHOCARDIOGRAPHY N/A 01-21-2012   TECHNICALLY DIFFICULT. MILD CONCENTRIC LV HYPERTROPHY. LV CAVITY IS SMALL.. EF=> 55%. TRANSMITRAL SPECTRAL FLOW PATTREN IS SUGGESTIVE OF IMPAIRED LV RELAXATION. RV SYSTOLIC PRESSURE IS [id number]. LEFT ATRIAL SIZE IS NORMAL. AV APPEARS MILDLY SCLEROTIC. NO SIGN VALVE DISEASE NOTED.  [name] LYMPHADENECTOMY Bilateral 08/30/2015   Procedure: PELVIC LYMPHADENECTOMY;  Surgeon: [name], MD;  Location: [facility];  Service: Urology;  Laterality: Bilateral;  . NUCLEAR STRESS TEST N/A 01-21-2012   NORMAL PATTERN OF PERFUSION IN ALL REGIONS. POST STRESS LV SIZE IS NORMAL. NO EVIDENCE OF INDUCIBLE ISCHEMIA. EF 54%.  [name] POLYPECTOMY    . PROSTATE BIOPSY    . ROBOT ASSISTED LAPAROSCOPIC RADICAL PROSTATECTOMY N/A 08/30/2015   Procedure: ROBOTIC ASSISTED LAPAROSCOPIC RADICAL PROSTATECTOMY;  Surgeon: [name], MD;  Location: [facility];  Service: Urology;  Laterality: N/A;  . US VENOUS LOWER EXT Right 03/07/11   PERSISTENT DVT IN RIGHT LOWER EXT.WITH PERSISTANT VISUALIZATION OF HYPOECHOIC THROMBUS WITHIN THE FEMORAL, PROFUNDA FEMORAL AND POPLITEAL VEINS. WHEN COMPARED TO PREVIOUS, CLOT IS NO LONGER IDENTIFIED WITH IN THE RIGHT CFV.    Current Outpatient Prescriptions  Medication Sig Dispense Refill  . albuterol (PROVENTIL) 2 MG tablet TAKE 1 TABLET BY MOUTH TWO TIMES DAILY AS NEEDED  1  . dicyclomine (BENTYL) 10 MG capsule Take 4 capsules (40 mg total) by mouth 4 (four) times daily -  before meals and at bedtime. 120 capsule 5  . docusate sodium (COLACE) 100 MG capsule Take 100 mg by mouth 2 (two) times  daily.    . folic acid (FOLVITE) 1 MG tablet Take 2 mg by mouth every morning.  4  . furosemide (LASIX) 40 MG tablet Take 1 tablet (40 mg total) by mouth 2 (two) times daily. 60 tablet 11  . leflunomide (ARAVA) 20 MG tablet Take 1 tablet (20 mg total) by mouth daily. 30 tablet 2  . mupirocin ointment (BACTROBAN) 2 % APPLY AS DIRECTED TO AFFECTED AREA(S) THREE TIMES A DAY  0  . omeprazole (PRILOSEC) 40 MG capsule Take 40 mg by mouth daily.  2  . phenytoin (DILANTIN) 100 MG ER capsule Take 100-200 mg by mouth 2 (two) times daily. Take one capsule in the morning and 2 capsules at bedtime    . predniSONE (DELTASONE) 10 MG tablet Take 25 mg daily (2 and one-half tablets) for two weeks then reduce dose to 20 mg daily in the am 187 tablet 0  . SYMBICORT 160-4.5 MCG/ACT inhaler Inhale 2 puffs into the lungs 2 (two) times daily.  2  . warfarin (COUMADIN) 3 MG tablet Take 3 mg by mouth at bedtime.     [name] allopurinol (ZYLOPRIM) 100 MG tablet TAKE 1 TABLET BY MOUTH TWICE A DAY 180 tablet 1   No current facility-administered medications for this visit.     Allergies:   Carbamazepine; Celecoxib; Cephalexin; Enbrel [etanercept]; Humira [adalimumab]; Levofloxacin; and Sulfa antibiotics   Social History   Social History  . Marital status: Married    Spouse name: N/A  . Number of children: 3  . Years of education: N/A   Occupational History  . retired Banker   Social History Main Topics  . Smoking status: Former Smoker    Packs/day: 3.00    Years: 20.00    Types: Cigarettes    Quit date: 12/30/1989  . Smokeless tobacco: Never Used  . Alcohol use No  . Drug use: No  . Sexual activity: Yes   Other Topics Concern  . None   Social History Narrative  . None     Family History:  The patient's family history includes Alzheimer's disease in his mother; Bladder Cancer in his sister; Breast cancer in his sister; Heart attack in his brother, brother, father, and sister; Heart murmur in his sister;  Lung cancer in his brother; Skin cancer in his father; Stomach cancer in his brother and father; Stroke in his sister; Throat cancer in his brother.   ROS:   Please see the history of present illness.    Review of Systems  Musculoskeletal: Positive for arthritis, joint swelling and stiffness.   All other systems reviewed and are negative.   PHYSICAL EXAM:   VS:  BP 138/70   Pulse 73   Ht 5\' 9"  (1.753 m)  Wt 228 lb 9.6 oz (103.7 kg)   SpO2 93%   BMI 33.76 kg/m    GEN: Well nourished, well developed, in no acute distress HEENT: normal Neck: no JVD, carotid bruits, or masses Cardiac: RRR; no murmurs, rubs, or gallops.  3+ pitting edema of the right lower extremity almost to the knee, 1+ edema of the left ankle  Respiratory:  clear to auscultation bilaterally, normal work of breathing GI: soft, nontender, nondistended, + BS MS: no deformity or atrophy Skin: warm and dry, no rash Neuro:  Alert and Oriented x 3, Strength and sensation are intact Psych: euthymic mood, full affect  Wt Readings from Last 3 Encounters:  01/06/17 228 lb 9.6 oz (103.7 kg)  12/31/16 231 lb (104.8 kg)  12/11/16 230 lb 6.4 oz (104.5 kg)      Studies/Labs Reviewed:   EKG:  EKG is ordered today. Normal sinus rhythm with early RS transition in V1-V2, otherwise normal. QTC 431 ms no change from previous tracings   Recent Labs: 11/25/2016: ALT 32; BUN 13; Creatinine, Ser 0.91; Platelets 213; Potassium 4.0; Sodium 144    ASSESSMENT:    1. Complicated postphlebitic syndrome   2. Chronic anticoagulation   3. Chronic obstructive pulmonary disease, unspecified COPD type ([place])   4. Rheumatoid arthritis involving multiple sites with positive rheumatoid factor (HCC)      PLAN:  In order of problems listed above:  1. Venous insufficiency: has been a little easier to manage after he retired. We discussed the risks of chronic skin swelling including the risk of venous stasis ulcers.  whenever he is standing  he needs to walk, whenever he is  setting , he should have his legs elevated. 2. Warfarin: No bleeding complications on chronic warfarin anticoagulation. 3. COPD: Since taking systemic steroids he has not had much in the way of wheezing problems. He takes the Symbicort "as needed". Make sure he understands that this is not a rescue inhaler should never be take more than twice a day.  4. RA:  Discussed the potential for iatrogenic adrenal insufficiency and the need for stress doses of steroids if he has a major illness in the next several months   Medication Adjustments/Labs and Tests Ordered: Current medicines are reviewed at length with the patient today.  Concerns regarding medicines are outlined above.  Medication changes, Labs and Tests ordered today are listed below. Patient Instructions  Dr Sallyanne Kuster recommends that you schedule a follow-up appointment in 12 months. You will receive a reminder letter in the mail two months in advance. If you don't receive a letter, please call our office to schedule the follow-up appointment.  If you need a refill on your cardiac medications before your next appointment, please call your pharmacy.     Signed, [name], MD  01/07/2017 11:40 AM    [facility] [place], [place], [place]  [address] Phone: [phone]; Fax: [phone]

## 2017-01-06 NOTE — Telephone Encounter (Signed)
Last Visit: 01/01/16 Next Visit: 03/31/17 Labs: 11/25/16 WNL  Okay to refill Allopurinol?

## 2017-01-06 NOTE — Patient Instructions (Signed)
Dr Croitoru recommends that you schedule a follow-up appointment in 12 months. You will receive a reminder letter in the mail two months in advance. If you don't receive a letter, please call our office to schedule the follow-up appointment.  If you need a refill on your cardiac medications before your next appointment, please call your pharmacy. 

## 2017-01-06 NOTE — Telephone Encounter (Signed)
ok 

## 2017-01-08 DIAGNOSIS — R9721 Rising PSA following treatment for malignant neoplasm of prostate: Secondary | ICD-10-CM | POA: Diagnosis not present

## 2017-01-09 ENCOUNTER — Telehealth: Payer: Self-pay | Admitting: Rheumatology

## 2017-01-09 NOTE — Telephone Encounter (Signed)
Patient states he is supposed to start infusions on Monday. Patient wants to know if he should stop the [place]. Patient advised that according to the last office note he should continue it along with the Prednisone and the infusion.

## 2017-01-09 NOTE — Telephone Encounter (Signed)
Patient left a voicemail requesting a call back about his arthritis. Please call patient, [phone]

## 2017-01-10 ENCOUNTER — Other Ambulatory Visit (HOSPITAL_COMMUNITY): Payer: Self-pay | Admitting: *Deleted

## 2017-01-11 DIAGNOSIS — J020 Streptococcal pharyngitis: Secondary | ICD-10-CM | POA: Diagnosis not present

## 2017-01-11 DIAGNOSIS — R05 Cough: Secondary | ICD-10-CM | POA: Diagnosis not present

## 2017-01-13 ENCOUNTER — Ambulatory Visit (HOSPITAL_COMMUNITY): Admission: RE | Admit: 2017-01-13 | Payer: Medicare Other | Source: Ambulatory Visit

## 2017-01-15 ENCOUNTER — Ambulatory Visit: Payer: Medicare Other | Admitting: Urology

## 2017-01-18 ENCOUNTER — Emergency Department (HOSPITAL_COMMUNITY)
Admission: EM | Admit: 2017-01-18 | Discharge: 2017-01-18 | Disposition: A | Discharge status: Home / Self Care | Payer: Medicare Other | Attending: Emergency Medicine | Admitting: Emergency Medicine

## 2017-01-18 ENCOUNTER — Emergency Department (HOSPITAL_COMMUNITY): Payer: Medicare Other

## 2017-01-18 ENCOUNTER — Encounter (HOSPITAL_COMMUNITY): Payer: Self-pay | Admitting: Emergency Medicine

## 2017-01-18 DIAGNOSIS — E876 Hypokalemia: Secondary | ICD-10-CM | POA: Insufficient documentation

## 2017-01-18 DIAGNOSIS — Z79899 Other long term (current) drug therapy: Secondary | ICD-10-CM | POA: Diagnosis not present

## 2017-01-18 DIAGNOSIS — I509 Heart failure, unspecified: Secondary | ICD-10-CM | POA: Insufficient documentation

## 2017-01-18 DIAGNOSIS — Z87891 Personal history of nicotine dependence: Secondary | ICD-10-CM | POA: Diagnosis not present

## 2017-01-18 DIAGNOSIS — R197 Diarrhea, unspecified: Secondary | ICD-10-CM

## 2017-01-18 DIAGNOSIS — Z7901 Long term (current) use of anticoagulants: Secondary | ICD-10-CM | POA: Diagnosis not present

## 2017-01-18 DIAGNOSIS — R5383 Other fatigue: Secondary | ICD-10-CM | POA: Diagnosis not present

## 2017-01-18 DIAGNOSIS — J45909 Unspecified asthma, uncomplicated: Secondary | ICD-10-CM | POA: Insufficient documentation

## 2017-01-18 DIAGNOSIS — R531 Weakness: Secondary | ICD-10-CM | POA: Diagnosis present

## 2017-01-18 DIAGNOSIS — R05 Cough: Secondary | ICD-10-CM | POA: Diagnosis not present

## 2017-01-18 LAB — CBC
HCT: 48.7 % (ref 39.0–52.0)
Hemoglobin: 16.2 g/dL (ref 13.0–17.0)
MCH: 29 pg (ref 26.0–34.0)
MCHC: 33.3 g/dL (ref 30.0–36.0)
MCV: 87.3 fL (ref 78.0–100.0)
Platelets: 184 10*3/uL (ref 150–400)
RBC: 5.58 MIL/uL (ref 4.22–5.81)
RDW: 15 % (ref 11.5–15.5)
WBC: 6.8 10*3/uL (ref 4.0–10.5)

## 2017-01-18 LAB — BASIC METABOLIC PANEL
Anion gap: 13 (ref 5–15)
BUN: 13 mg/dL (ref 6–20)
CO2: 30 mmol/L (ref 22–32)
Calcium: 8.7 mg/dL — ABNORMAL LOW (ref 8.9–10.3)
Chloride: 94 mmol/L — ABNORMAL LOW (ref 101–111)
Creatinine, Ser: 1.14 mg/dL (ref 0.61–1.24)
GFR calc Af Amer: >60 mL/min (ref >60)
GFR calc non Af Amer: >60 mL/min (ref >60)
Glucose, Bld: 151 mg/dL — ABNORMAL HIGH (ref 65–99)
Potassium: 2.6 mmol/L — CL (ref 3.5–5.1)
Sodium: 137 mmol/L (ref 135–145)

## 2017-01-18 LAB — URINALYSIS, ROUTINE W REFLEX MICROSCOPIC
Bilirubin Urine: NEGATIVE
Glucose, UA: NEGATIVE mg/dL
Hgb urine dipstick: NEGATIVE
Ketones, ur: 5 mg/dL — AB
Nitrite: NEGATIVE
Protein, ur: 30 mg/dL — AB
Specific Gravity, Urine: 1.025 (ref 1.005–1.030)
pH: 5 (ref 5.0–8.0)

## 2017-01-18 LAB — BRAIN NATRIURETIC PEPTIDE: B Natriuretic Peptide: 25 pg/mL (ref 0.0–100.0)

## 2017-01-18 LAB — PROTIME-INR
INR: 2.21
Prothrombin Time: 24.9 seconds — ABNORMAL HIGH (ref 11.4–15.2)

## 2017-01-18 MED ORDER — POTASSIUM CHLORIDE CRYS ER 20 MEQ PO TBCR
40.0000 meq | EXTENDED_RELEASE_TABLET | Freq: Once | ORAL | Status: AC
  Administered 2017-01-18: 40 meq via ORAL
  Filled 2017-01-18: qty 2

## 2017-01-18 MED ORDER — POTASSIUM CHLORIDE CRYS ER 20 MEQ PO TBCR: 40.0000 meq | EXTENDED_RELEASE_TABLET | Freq: Every day | ORAL | 0 refills | Status: DC

## 2017-01-18 MED ORDER — POTASSIUM CHLORIDE 10 MEQ/100ML IV SOLN
10.0000 meq | Freq: Once | INTRAVENOUS | Status: AC
  Administered 2017-01-18: 10 meq via INTRAVENOUS
  Filled 2017-01-18: qty 100

## 2017-01-18 MED ORDER — SODIUM CHLORIDE 0.9 % IV SOLN
Freq: Once | INTRAVENOUS | Status: AC
  Administered 2017-01-18: 15:00:00 via INTRAVENOUS

## 2017-01-18 MED ORDER — IPRATROPIUM-ALBUTEROL 0.5-2.5 (3) MG/3ML IN SOLN
3.0000 mL | RESPIRATORY_TRACT | Status: AC
  Administered 2017-01-18 (×3): 3 mL via RESPIRATORY_TRACT
  Filled 2017-01-18: qty 9

## 2017-01-18 MED ORDER — METHYLPREDNISOLONE SODIUM SUCC 125 MG IJ SOLR
125.0000 mg | Freq: Once | INTRAMUSCULAR | Status: AC
  Administered 2017-01-18: 125 mg via INTRAVENOUS
  Filled 2017-01-18: qty 2

## 2017-01-18 NOTE — ED Triage Notes (Signed)
PT states currently on antibotics for strep throat since 01/11/17 and since then has developed diarrhea, cough, generalized weakness.

## 2017-01-18 NOTE — ED Notes (Signed)
Call from lab critical K 2.6

## 2017-01-18 NOTE — ED Provider Notes (Signed)
[facility] DEPT Provider Note   CSN: [id number] Arrival date & time: 01/18/17  1151     History   Chief Complaint Chief Complaint  Patient presents with  . Weakness    HPI [name] is a 66 y.o. male.  The history is provided by the patient.   CC: fatigue  Onset/Duration: 1 week Timing: constant, worsening Location: generalized Severity: moderate Modifying Factors:  Improved by: nothing  Worsened by: activity Associated Signs/Symptoms:  Pertinent (+): cough, SOB (worse that baseline), rhinorrhea, nasal congestion, diarrhea (since taking Augmentin).   Pertinent (-): fevers, chills, myalgia, nausea, vomiting,  Context: was diagnosed with strep throat last week and placed on Augmentin. Since he has had worsening fatigue and diarrhea.  H/o prostate cancer s/p resection and radiation. Also h/o RA on prednisone and immunomodulator's.  H/o DVT on coumadin. BLE edema at baseline.   Past Medical History:  Diagnosis Date  . Anxiety    hx of   . Arthritis   . Asthma   . Clotting disorder (HCC)    DVT both legs   . COPD (chronic obstructive pulmonary disease) ([place])   . DDD (degenerative disc disease), cervical    with UE's paresthesias  . Diverticulosis   . DVT, lower extremity ([place])    bilat  . 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 GERD (gastroesophageal reflux disease)   . Gout   . Heart murmur   . History of measles   . History of shingles   . Hypercholesterolemia   . IBS (irritable bowel syndrome)   . IBS (irritable bowel syndrome)   . Peripheral edema   . Pneumonia    hx of   . Prostate cancer ([place])   . Seizures ([place])    last seizure 20 years ago   . Shortness of breath dyspnea    exertion   . Sleep apnea    not on cpap  . Tubular adenoma of colon 04/2008    Patient Active Problem List   Diagnosis Date Noted  . High risk medication use 12/29/2016  . History of prostate cancer  12/29/2016  . Primary osteoarthritis of both knees 12/29/2016  . Chronic idiopathic gout involving toe without tophus 12/29/2016  . History of CHF (congestive heart failure) 12/29/2016  . History of COPD 12/29/2016  . Plantar pustular psoriasis 12/29/2016  . DVT (deep venous thrombosis) ([place]) 10/31/2016  . COPD (chronic obstructive pulmonary disease) ([place]) 10/31/2016  . CHF (congestive heart failure) ([place]) 10/31/2016  . Prostate cancer ([place]) 08/30/2015  . Malignant neoplasm of prostate ([place]) 07/04/2015  . Chest pain at rest 07/05/2014  . Weakness 07/05/2014  . Complicated postphlebitic syndrome 11/16/2013  . Personal history of DVT (deep vein thrombosis) 05/18/2013  . Chronic anticoagulation 05/18/2013  . Diverticulosis of colon without hemorrhage 05/18/2013  . Rheumatoid arthritis ([place]) 05/18/2013  . Seizure disorder ([place]) 05/18/2013  . Hx of adenomatous colonic polyps 05/18/2013  . GERD 05/08/2010  . NAUSEA 05/08/2010  . FLATULENCE-GAS-BLOATING 05/08/2010    Past Surgical History:  Procedure Laterality Date  . CHOLECYSTECTOMY    . COLONOSCOPY    . DOPPLER ECHOCARDIOGRAPHY N/A 01-21-2012   TECHNICALLY DIFFICULT. MILD CONCENTRIC LV HYPERTROPHY. LV CAVITY IS SMALL.. EF=> 55%. TRANSMITRAL SPECTRAL FLOW PATTREN IS SUGGESTIVE OF IMPAIRED LV RELAXATION. RV SYSTOLIC PRESSURE IS [id number]. LEFT ATRIAL SIZE IS NORMAL. AV APPEARS MILDLY SCLEROTIC. NO SIGN VALVE DISEASE NOTED.  [name] LYMPHADENECTOMY Bilateral  08/30/2015   Procedure: PELVIC LYMPHADENECTOMY;  Surgeon: [name], MD;  Location: [facility];  Service: Urology;  Laterality: Bilateral;  . NUCLEAR STRESS TEST N/A 01-21-2012   NORMAL PATTERN OF PERFUSION IN ALL REGIONS. POST STRESS LV SIZE IS NORMAL. NO EVIDENCE OF INDUCIBLE ISCHEMIA. EF 54%.  [name] POLYPECTOMY    . PROSTATE BIOPSY    . ROBOT ASSISTED LAPAROSCOPIC RADICAL PROSTATECTOMY N/A 08/30/2015   Procedure: ROBOTIC ASSISTED LAPAROSCOPIC RADICAL PROSTATECTOMY;  Surgeon: [name], MD;  Location: [facility];  Service: Urology;  Laterality: N/A;  . US VENOUS LOWER EXT Right 03/07/11   PERSISTENT DVT IN RIGHT LOWER EXT.WITH PERSISTANT VISUALIZATION OF HYPOECHOIC THROMBUS WITHIN THE FEMORAL, PROFUNDA FEMORAL AND POPLITEAL VEINS. WHEN COMPARED TO PREVIOUS, CLOT IS NO LONGER IDENTIFIED WITH IN THE RIGHT CFV.       Home Medications    Prior to Admission medications   Medication Sig Start Date End Date Taking? Authorizing Provider  albuterol (PROVENTIL) 2 MG tablet TAKE 1 TABLET BY MOUTH TWO TIMES DAILY AS NEEDED 06/07/16  Yes Historical Provider, MD  allopurinol (ZYLOPRIM) 100 MG tablet TAKE 1 TABLET BY MOUTH TWICE A DAY 01/06/17  Yes Bo Merino, MD  amoxicillin-clavulanate (AUGMENTIN) 875-125 MG tablet Take 1 tablet by mouth 2 (two) times daily.  01/11/17  Yes Historical Provider, MD  dicyclomine (BENTYL) 10 MG capsule Take 4 capsules (40 mg total) by mouth 4 (four) times daily -  before meals and at bedtime. 06/05/16  Yes Ladene Artist, MD  docusate sodium (COLACE) 100 MG capsule Take 100 mg by mouth 2 (two) times daily.   Yes Historical Provider, MD  folic acid (FOLVITE) 1 MG tablet Take 2 mg by mouth every morning. 09/16/16  Yes Historical Provider, MD  furosemide (LASIX) 40 MG tablet Take 1 tablet (40 mg total) by mouth 2 (two) times daily. 05/29/16  Yes Mihai Croitoru, MD  leflunomide (ARAVA) 20 MG tablet Take 1 tablet (20 mg total) by mouth daily. 10/31/16  Yes Bo Merino, MD  mupirocin ointment (BACTROBAN) 2 % APPLY AS DIRECTED TO AFFECTED AREA(S) THREE TIMES A DAY 09/10/16  Yes Historical Provider, MD  omeprazole (PRILOSEC) 40 MG capsule Take 40 mg by mouth daily. 09/23/16  Yes Historical Provider, MD  phenytoin (DILANTIN) 100 MG ER capsule Take 100-200 mg by mouth 2 (two) times daily. Take one capsule in the morning and 2 capsules at bedtime   Yes Historical Provider, MD  predniSONE (DELTASONE) 10 MG tablet Take 25 mg daily (2 and one-half tablets) for two  weeks then reduce dose to 20 mg daily in the am 12/31/16  Yes Bo Merino, MD  promethazine-dextromethorphan (PROMETHAZINE-DM) 6.25-15 MG/5ML syrup TAKE 5 MILLILITERS BY MOUTH EVERY 6 HOURS AS NEEDED FOR COUGH 01/11/17  Yes Historical Provider, MD  SYMBICORT 160-4.5 MCG/ACT inhaler Inhale 2 puffs into the lungs 2 (two) times daily. 10/07/16  Yes Historical Provider, MD  warfarin (COUMADIN) 3 MG tablet Take 3 mg by mouth at bedtime.    Yes Historical Provider, MD  potassium chloride SA (K-DUR,KLOR-CON) 20 MEQ tablet Take 2 tablets (40 mEq total) by mouth daily. 01/18/17 01/25/17  Fatima Blank, MD    Family History Family History  Problem Relation Age of Onset  . Skin cancer Father   . Stomach cancer Father   . Heart attack Father   . Alzheimer's disease Mother   . Throat cancer Brother   . Stomach cancer Brother   . Lung cancer Brother   .  Heart attack Brother   . Heart attack Brother   . Breast cancer Sister   . Heart attack Sister   . Stroke Sister   . Bladder Cancer Sister   . Heart murmur Sister   . Colon cancer Neg Hx   . Colon polyps Neg Hx     Social History Social History  Substance Use Topics  . Smoking status: Former Smoker    Packs/day: 3.00    Years: 20.00    Types: Cigarettes    Quit date: 12/30/1989  . Smokeless tobacco: Never Used  . Alcohol use No     Allergies   Carbamazepine; Celecoxib; Cephalexin; Enbrel [etanercept]; Humira [adalimumab]; Levofloxacin; and Sulfa antibiotics   Review of Systems Review of Systems Ten systems are reviewed and are negative for acute change except as noted in the HPI   Physical Exam Updated Vital Signs BP 164/94 (BP Location: Left Arm)   Pulse 78   Temp 97.4 F (36.3 C) (Oral)   Resp 18   Ht 5\' 9"  (1.753 m)   Wt 224 lb (101.6 kg)   SpO2 93%   BMI 33.08 kg/m   Physical Exam  Constitutional: He is oriented to person, place, and time. He appears well-developed and well-nourished. No distress.  HENT:    Head: Normocephalic and atraumatic.  Nose: Nose normal.  Eyes: Conjunctivae and EOM are normal. Pupils are equal, round, and reactive to light. Right eye exhibits no discharge. Left eye exhibits no discharge. No scleral icterus.  Neck: Normal range of motion. Neck supple.  Cardiovascular: Normal rate and regular rhythm.  Exam reveals no gallop and no friction rub.   No murmur heard. Pulmonary/Chest: Effort normal. No stridor. Tachypnea noted. No respiratory distress. He has no decreased breath sounds. He has wheezes.  Abdominal: Soft. He exhibits no distension. There is no tenderness.  Musculoskeletal: He exhibits no edema or tenderness.  BLE edema R>L (baseline per pt)  Neurological: He is alert and oriented to person, place, and time.  Skin: Skin is warm and dry. No rash noted. He is not diaphoretic. No erythema.  Psychiatric: He has a normal mood and affect.  Vitals reviewed.    ED Treatments / Results  Labs (all labs ordered are listed, but only abnormal results are displayed) Labs Reviewed  BASIC METABOLIC PANEL - Abnormal; Notable for the following:       Result Value   Potassium 2.6 (*)    Chloride 94 (*)    Glucose, Bld 151 (*)    Calcium 8.7 (*)    All other components within normal limits  URINALYSIS, ROUTINE W REFLEX MICROSCOPIC - Abnormal; Notable for the following:    Color, Urine AMBER (*)    APPearance CLOUDY (*)    Ketones, ur 5 (*)    Protein, ur 30 (*)    Leukocytes, UA SMALL (*)    Bacteria, UA FEW (*)    All other components within normal limits  PROTIME-INR - Abnormal; Notable for the following:    Prothrombin Time 24.9 (*)    All other components within normal limits  CBC  BRAIN NATRIURETIC PEPTIDE    EKG  EKG Interpretation  Date/Time:  Saturday January 18 2017 14:14:07 EST Ventricular Rate:  74 PR Interval:    QRS Duration: 87 QT Interval:  400 QTC Calculation: 444 R Axis:   68 Text Interpretation:  Sinus rhythm Consider left atrial  enlargement Posterior infarct, old No significant change since last tracing Confirmed by [facility] MD,  Taji Barretto [phone]) on 01/18/2017 4:52:53 PM       Radiology Dg Chest 2 View  Result Date: 01/18/2017 CLINICAL DATA:  Cough EXAM: CHEST  2 VIEW COMPARISON:  09/16/2016 FINDINGS: Chronic elevation of the left hemidiaphragm. Normal heart size. Lungs clear. No pneumothorax or pleural effusion. IMPRESSION: No active cardiopulmonary disease. Electronically Signed   By: Marybelle Killings M.D.   On: 01/18/2017 14:47    Procedures Procedures (including critical care time)  Medications Ordered in ED Medications  ipratropium-albuterol (DUONEB) 0.5-2.5 (3) MG/3ML nebulizer solution 3 mL (3 mLs Nebulization Given 01/18/17 1501)  methylPREDNISolone sodium succinate (SOLU-MEDROL) 125 mg/2 mL injection 125 mg (125 mg Intravenous Given 01/18/17 1415)  potassium chloride SA (K-DUR,KLOR-CON) CR tablet 40 mEq (40 mEq Oral Given 01/18/17 1524)  potassium chloride 10 mEq in 100 mL IVPB (0 mEq Intravenous Stopped 01/18/17 1624)  0.9 %  sodium chloride infusion ( Intravenous Stopped 01/18/17 1726)     Initial Impression / Assessment and Plan / ED Course  I have reviewed the triage vital signs and the nursing notes.  Pertinent labs & imaging results that were available during my care of the patient were reviewed by me and considered in my medical decision making (see chart for details).     Presentation is most consistent with likely dehydration from diarrhea which is likely secondary to side effect from Augmentin. Patient without leukocytosis, thus doubt C. difficile infection. Patient provided with IV fluids with significant improvement in his generalized fatigue.  Patient does have lower extremity edema; given the patient's increased shortness of breath. Obtain a BNP and chest x-ray were that were within normal limits and not suggestive of heart failure.  Workup was notable for hypokalemia which is likely secondary to  diuretic use in conjunction with diarrhea. Repleted orally and to IV. Instructed to follow-up with PCP for recheck of this upcoming week.  Patient also noted to have COPD exacerbation which is significantly improved after breathing treatments and Solu-Medrol. Chest x-ray without evidence of pneumonia.  The patient is safe for discharge with strict return precautions.   Final Clinical Impressions(s) / ED Diagnoses   Final diagnoses:  Other fatigue  Hypokalemia  Diarrhea, unspecified type   Disposition: Discharge  Condition: Good  I have discussed the results, Dx and Tx plan with the patient who expressed understanding and agree(s) with the plan. Discharge instructions discussed at great length. The patient was given strict return precautions who verbalized understanding of the instructions. No further questions at time of discharge.    Discharge Medication List as of 01/18/2017  4:56 PM    START taking these medications   Details  potassium chloride SA (K-DUR,KLOR-CON) 20 MEQ tablet Take 2 tablets (40 mEq total) by mouth daily., Starting Sat 01/18/2017, Until Sat 01/25/2017, Print        Follow Up: [name], MD [address] [place] [id number] [phone]  Schedule an appointment as soon as possible for a visit  in 3-5 days, If symptoms do not improve or  worsen and to follow up potassium level      Fatima Blank, MD 01/18/17 1729

## 2017-01-19 ENCOUNTER — Other Ambulatory Visit: Payer: Self-pay | Admitting: Rheumatology

## 2017-01-19 ENCOUNTER — Other Ambulatory Visit: Payer: Self-pay | Admitting: Gastroenterology

## 2017-01-22 DIAGNOSIS — E6609 Other obesity due to excess calories: Secondary | ICD-10-CM | POA: Diagnosis not present

## 2017-01-22 DIAGNOSIS — J209 Acute bronchitis, unspecified: Secondary | ICD-10-CM | POA: Diagnosis not present

## 2017-01-22 DIAGNOSIS — E876 Hypokalemia: Secondary | ICD-10-CM | POA: Diagnosis not present

## 2017-01-22 DIAGNOSIS — Z6833 Body mass index (BMI) 33.0-33.9, adult: Secondary | ICD-10-CM | POA: Diagnosis not present

## 2017-01-22 DIAGNOSIS — Z1389 Encounter for screening for other disorder: Secondary | ICD-10-CM | POA: Diagnosis not present

## 2017-01-30 NOTE — Progress Notes (Signed)
Mr. Manik Westgate. Haberl 66 y.o. Man with prostate cancer completed radiation 12-13-16 one month FU.  Pain: He is currently having pain right lower rib 5/10 feels it is from coughing.    URINARY: He denies reports urinary frequency,urinary urgency,having incontinence and dribbling wearing a pad.  Reports he is emptying  his bladder. Pt states he gets up to urinate x4  times per night. BOWEL: He reports a  bowel movement everyday more often two to three times a day.  Having soft stools not diarrhea. Fatigue:Having fatigue feels he is getting stronger. Appetite:Fair eating two meals a day. Weight: Wt Readings from Last 3 Encounters:  02/04/17 223 lb 12.8 oz (101.5 kg)  01/18/17 224 lb (101.6 kg)  01/06/17 228 lb 9.6 oz (103.7 kg)  Had a recent staph throat afebrile 98.2 stated his throat is still sore at times but better overall. Urologist:Dr. Alyson Ingles will see next week. Lupron injection: None                                                            [id number] than .01 ng/ml Lupron injection every  months last dose N/A BP (!) 135/95   Pulse 82   Temp 98.2 F (36.8 C) (Oral)   Resp 20   Ht 5\' 9"  (1.753 m)   Wt 223 lb 12.8 oz (101.5 kg)   SpO2 97%   BMI 33.05 kg/m

## 2017-02-04 ENCOUNTER — Ambulatory Visit
Admission: RE | Admit: 2017-02-04 | Discharge: 2017-02-04 | Disposition: A | Discharge status: Home / Self Care | Payer: Medicare Other | Source: Ambulatory Visit | Attending: Radiation Oncology | Admitting: Radiation Oncology

## 2017-02-04 ENCOUNTER — Encounter: Payer: Self-pay | Admitting: Radiation Oncology

## 2017-02-04 VITALS — BP 135/95 | HR 82 | Temp 98.2°F | Resp 20 | Ht 69.0 in | Wt 223.8 lb

## 2017-02-04 DIAGNOSIS — C61 Malignant neoplasm of prostate: Secondary | ICD-10-CM

## 2017-02-04 DIAGNOSIS — Z882 Allergy status to sulfonamides status: Secondary | ICD-10-CM | POA: Diagnosis not present

## 2017-02-04 DIAGNOSIS — Z881 Allergy status to other antibiotic agents status: Secondary | ICD-10-CM | POA: Diagnosis not present

## 2017-02-04 DIAGNOSIS — Z888 Allergy status to other drugs, medicaments and biological substances status: Secondary | ICD-10-CM | POA: Insufficient documentation

## 2017-02-04 DIAGNOSIS — J040 Acute laryngitis: Secondary | ICD-10-CM

## 2017-02-04 DIAGNOSIS — Z7901 Long term (current) use of anticoagulants: Secondary | ICD-10-CM | POA: Insufficient documentation

## 2017-02-04 DIAGNOSIS — Z923 Personal history of irradiation: Secondary | ICD-10-CM | POA: Diagnosis not present

## 2017-02-04 NOTE — Progress Notes (Signed)
Radiation Oncology         [phone] ________________________________  Name: [name] MRN: [id number]  Date: 02/04/2017  DOB: 1951-02-06  Post Treatment Note  CC: Purvis Kilts, MD  [name], MD  Diagnosis:    66 yo man with detectable rising PSA of 0.1 s/p radical prostatectomy with no adverse pathology features     Interval Since Last Radiation:  7 weeks   10/22/16-12/13/16: The prostatic fossa was treated to 68.4 Gy in 38 fractions of 1.8 Gy  Narrative:  The patient returns today for routine follow-up. In the interim he developed strep throat and has been on two courses of antibiotics. He developed what sounds to have been an allergic reaction to his antibiotics initially and was seen in the ED, receiving a new antibiotic and steroid injection. He has been hoarse for the last 3 weeks, and completed all antibiotic therapy about 1 week ago. He's currently afebrile, and reports his voice is mildly improving. He denies shortness of breath currently. He does have chest pain in the right chest wall due to coughing. No other complaints are verbalized.                             ALLERGIES:  is allergic to carbamazepine; celecoxib; cephalexin; enbrel [etanercept]; humira [adalimumab]; levofloxacin; and sulfa antibiotics.  Meds: Current Outpatient Prescriptions  Medication Sig Dispense Refill  . albuterol (PROVENTIL) 2 MG tablet TAKE 1 TABLET BY MOUTH TWO TIMES DAILY AS NEEDED  1  . allopurinol (ZYLOPRIM) 100 MG tablet TAKE 1 TABLET BY MOUTH TWICE A DAY 180 tablet 1  . dicyclomine (BENTYL) 10 MG capsule TAKE 4 CAPSULES (40 MG TOTAL) BY MOUTH 4 (FOUR) TIMES DAILY - BEFORE MEALS AND AT BEDTIME. [id number] capsule 3  . folic acid (FOLVITE) 1 MG tablet Take 2 mg by mouth every morning.  4  . furosemide (LASIX) 40 MG tablet Take 1 tablet (40 mg total) by mouth 2 (two) times daily. 60 tablet 11  . mupirocin ointment (BACTROBAN) 2 % APPLY AS DIRECTED TO AFFECTED AREA(S) THREE  TIMES A DAY  0  . omeprazole (PRILOSEC) 40 MG capsule Take 40 mg by mouth daily.  2  . phenytoin (DILANTIN) 100 MG ER capsule Take 100-200 mg by mouth 2 (two) times daily. Take one capsule in the morning and 2 capsules at bedtime    . SYMBICORT 160-4.5 MCG/ACT inhaler Inhale 2 puffs into the lungs 2 (two) times daily.  2  . warfarin (COUMADIN) 3 MG tablet Take 3 mg by mouth at bedtime.     . docusate sodium (COLACE) 100 MG capsule Take 100 mg by mouth 2 (two) times daily.    [name] leflunomide (ARAVA) 20 MG tablet TAKE 1 TABLET (20 MG TOTAL) BY MOUTH DAILY. (Patient not taking: Reported on 02/04/2017) 30 tablet 2  . potassium chloride SA (K-DUR,KLOR-CON) 20 MEQ tablet Take 2 tablets (40 mEq total) by mouth daily. 14 tablet 0  . predniSONE (DELTASONE) 10 MG tablet Take 25 mg daily (2 and one-half tablets) for two weeks then reduce dose to 20 mg daily in the am (Patient not taking: Reported on 02/04/2017) 187 tablet 0  . promethazine-dextromethorphan (PROMETHAZINE-DM) 6.25-15 MG/5ML syrup TAKE 5 MILLILITERS BY MOUTH EVERY 6 HOURS AS NEEDED FOR COUGH  0   No current facility-administered medications for this encounter.     Physical Findings:  height is 5\' 9"  (1.753 m) and weight  is 223 lb 12.8 oz (101.5 kg). His oral temperature is 98.2 F (36.8 C). His blood pressure is 135/95 (abnormal) and his pulse is 82. His respiration is 20 and oxygen saturation is 97%.  Pain Assessment Pain Score: 5  (Right lower rib)/10  In general this is a well appearing caucasian in no acute distress. He's alert and oriented x4 and appropriate throughout the examination. Cardiopulmonary assessment is negative for acute distress and he exhibits normal effort.   Lab Findings: Lab Results  Component Value Date   WBC 6.8 01/18/2017   HGB 16.2 01/18/2017   HCT 48.7 01/18/2017   MCV 87.3 01/18/2017   PLT 184 01/18/2017     Radiographic Findings: Dg Chest 2 View  Result Date: 01/18/2017 CLINICAL DATA:  Cough EXAM: CHEST   2 VIEW COMPARISON:  09/16/2016 FINDINGS: Chronic elevation of the left hemidiaphragm. Normal heart size. Lungs clear. No pneumothorax or pleural effusion. IMPRESSION: No active cardiopulmonary disease. Electronically Signed   By: Marybelle Killings M.D.   On: 01/18/2017 14:47    Impression/Plan: 56.  66 yo man with detectable rising PSA of 0.1 s/p radical prostatectomy with no adverse pathology features.  The patient will follow up with Dr. Alyson Ingles next week. His most recent PSA last week was <0.01. He will follow up with [place] as needed moving forward. 2. Laryngitis. His voice is quite hoarse and has multiple episodes of his voice cracking during our encounter. We will refer him to ENT for formal evaluation to make sure he doesn't have any long term concerns about regaining his voice.      Carola Rhine, PAC

## 2017-02-04 NOTE — Addendum Note (Signed)
Encounter addended by: Malena Edman, RN on: 02/04/2017  2:11 PM<BR>    Actions taken: Charge Capture section accepted

## 2017-02-06 ENCOUNTER — Telehealth: Payer: Self-pay | Admitting: *Deleted

## 2017-02-06 NOTE — Telephone Encounter (Signed)
CALLED PATIENT TO INFORM OF APPT. WITH DR. Orpah Greek BATES ON 02-19-17- ARRIVAL TIME - 10:10 AM , ADDRESS [address] [place]. NO. [phone], SPOKE WITH PATIENT AND HE IS AWARE OF THIS APPT.

## 2017-02-12 ENCOUNTER — Ambulatory Visit (INDEPENDENT_AMBULATORY_CARE_PROVIDER_SITE_OTHER): Payer: Medicare Other | Admitting: Urology

## 2017-02-12 DIAGNOSIS — C61 Malignant neoplasm of prostate: Secondary | ICD-10-CM | POA: Diagnosis not present

## 2017-02-12 DIAGNOSIS — N5231 Erectile dysfunction following radical prostatectomy: Secondary | ICD-10-CM

## 2017-02-19 DIAGNOSIS — J343 Hypertrophy of nasal turbinates: Secondary | ICD-10-CM | POA: Diagnosis not present

## 2017-02-19 DIAGNOSIS — R490 Dysphonia: Secondary | ICD-10-CM | POA: Diagnosis not present

## 2017-02-19 DIAGNOSIS — Z87891 Personal history of nicotine dependence: Secondary | ICD-10-CM | POA: Diagnosis not present

## 2017-02-19 DIAGNOSIS — K219 Gastro-esophageal reflux disease without esophagitis: Secondary | ICD-10-CM | POA: Diagnosis not present

## 2017-02-24 ENCOUNTER — Emergency Department (HOSPITAL_COMMUNITY)
Admission: EM | Admit: 2017-02-24 | Discharge: 2017-02-24 | Disposition: A | Discharge status: Home / Self Care | Payer: Medicare Other | Attending: Emergency Medicine | Admitting: Emergency Medicine

## 2017-02-24 ENCOUNTER — Ambulatory Visit (HOSPITAL_COMMUNITY)
Admission: RE | Admit: 2017-02-24 | Discharge: 2017-02-24 | Disposition: A | Discharge status: Home / Self Care | Payer: Medicare Other | Source: Ambulatory Visit | Attending: Rheumatology | Admitting: Rheumatology

## 2017-02-24 ENCOUNTER — Telehealth: Payer: Self-pay | Admitting: Pharmacist

## 2017-02-24 ENCOUNTER — Encounter (HOSPITAL_COMMUNITY): Payer: Self-pay | Admitting: Emergency Medicine

## 2017-02-24 DIAGNOSIS — Z7901 Long term (current) use of anticoagulants: Secondary | ICD-10-CM | POA: Insufficient documentation

## 2017-02-24 DIAGNOSIS — Z87891 Personal history of nicotine dependence: Secondary | ICD-10-CM | POA: Diagnosis not present

## 2017-02-24 DIAGNOSIS — M0579 Rheumatoid arthritis with rheumatoid factor of multiple sites without organ or systems involvement: Secondary | ICD-10-CM | POA: Insufficient documentation

## 2017-02-24 DIAGNOSIS — Z8546 Personal history of malignant neoplasm of prostate: Secondary | ICD-10-CM | POA: Diagnosis not present

## 2017-02-24 DIAGNOSIS — J449 Chronic obstructive pulmonary disease, unspecified: Secondary | ICD-10-CM | POA: Diagnosis not present

## 2017-02-24 DIAGNOSIS — I509 Heart failure, unspecified: Secondary | ICD-10-CM | POA: Diagnosis not present

## 2017-02-24 DIAGNOSIS — T7840XA Allergy, unspecified, initial encounter: Secondary | ICD-10-CM | POA: Diagnosis not present

## 2017-02-24 LAB — COMPREHENSIVE METABOLIC PANEL
ALT: 37 U/L (ref 17–63)
AST: 33 U/L (ref 15–41)
Albumin: 3.3 g/dL — ABNORMAL LOW (ref 3.5–5.0)
Alkaline Phosphatase: 94 U/L (ref 38–126)
Anion gap: 11 (ref 5–15)
BUN: 15 mg/dL (ref 6–20)
CO2: 28 mmol/L (ref 22–32)
Calcium: 8.8 mg/dL — ABNORMAL LOW (ref 8.9–10.3)
Chloride: 99 mmol/L — ABNORMAL LOW (ref 101–111)
Creatinine, Ser: 0.92 mg/dL (ref 0.61–1.24)
GFR calc Af Amer: >60 mL/min (ref >60)
GFR calc non Af Amer: >60 mL/min (ref >60)
Glucose, Bld: 105 mg/dL — ABNORMAL HIGH (ref 65–99)
Potassium: 3.1 mmol/L — ABNORMAL LOW (ref 3.5–5.1)
Sodium: 138 mmol/L (ref 135–145)
Total Bilirubin: 0.7 mg/dL (ref 0.3–1.2)
Total Protein: 6.6 g/dL (ref 6.5–8.1)

## 2017-02-24 LAB — CBC
HCT: 45.8 % (ref 39.0–52.0)
Hemoglobin: 15.3 g/dL (ref 13.0–17.0)
MCH: 29.4 pg (ref 26.0–34.0)
MCHC: 33.4 g/dL (ref 30.0–36.0)
MCV: 87.9 fL (ref 78.0–100.0)
Platelets: 238 10*3/uL (ref 150–400)
RBC: 5.21 MIL/uL (ref 4.22–5.81)
RDW: 16 % — ABNORMAL HIGH (ref 11.5–15.5)
WBC: 12.8 10*3/uL — ABNORMAL HIGH (ref 4.0–10.5)

## 2017-02-24 MED ORDER — DIPHENHYDRAMINE HCL 25 MG PO CAPS
ORAL_CAPSULE | ORAL | Status: AC
  Administered 2017-02-24: 25 mg
  Filled 2017-02-24: qty 1

## 2017-02-24 MED ORDER — SODIUM CHLORIDE 0.9 % IV SOLN
250.0000 mg | Freq: Once | INTRAVENOUS | Status: AC
  Administered 2017-02-24: 250 mg via INTRAVENOUS
  Filled 2017-02-24: qty 2

## 2017-02-24 MED ORDER — FAMOTIDINE IN NACL 20-0.9 MG/50ML-% IV SOLN
20.0000 mg | Freq: Once | INTRAVENOUS | Status: AC
  Administered 2017-02-24: 20 mg via INTRAVENOUS
  Filled 2017-02-24: qty 50

## 2017-02-24 MED ORDER — ACETAMINOPHEN 325 MG PO TABS
ORAL_TABLET | ORAL | Status: AC
  Administered 2017-02-24: 650 mg
  Filled 2017-02-24: qty 2

## 2017-02-24 MED ORDER — DIPHENHYDRAMINE HCL 50 MG/ML IJ SOLN
25.0000 mg | Freq: Once | INTRAMUSCULAR | Status: AC
  Administered 2017-02-24: 25 mg via INTRAVENOUS

## 2017-02-24 MED ORDER — ACETAMINOPHEN 325 MG PO TABS: 650.0000 mg | ORAL_TABLET | ORAL | Status: DC

## 2017-02-24 MED ORDER — PREDNISONE 20 MG PO TABS: 30.0000 mg | ORAL_TABLET | Freq: Every day | ORAL | Status: DC

## 2017-02-24 MED ORDER — DIPHENHYDRAMINE HCL 25 MG PO CAPS: 25.0000 mg | ORAL_CAPSULE | ORAL | Status: DC

## 2017-02-24 MED ORDER — SODIUM CHLORIDE 0.9 % IV SOLN
1000.0000 mg | INTRAVENOUS | Status: DC
  Administered 2017-02-24: 1000 mg via INTRAVENOUS
  Filled 2017-02-24: qty 40

## 2017-02-24 NOTE — Progress Notes (Signed)
After 105 ml of Orencia, pt c/o itching on arms and left side of abdomen. Scratching. Arms and abdomen slightly red. Orencia stopped. Dr. Estanislado Pandy notified. Orders received. Patient developed whelps on arms and abdomen, began coughing. Whelps spread across chest, abdomen, arms, patient began to wheeze. 2 L of O2 applied. Dr. Estanislado Pandy notified. ED called. Patient taken to ED via w/c, on 2 L O2 via [place] by Anette Guarneri, RN, accompanied by Jackquline Denmark, CNA and patient's wife.

## 2017-02-24 NOTE — Discharge Instructions (Signed)
Pepcid 20 mg every 12 hours.  Benadryl 25 mg every 4 hours.

## 2017-02-24 NOTE — Telephone Encounter (Signed)
I called Mr. Renicker to follow up after his infusion reaction earlier today.  Patient reports his symptoms have resolved.  Patient was advised to schedule a follow up appointment in approximately two weeks to discuss treatment options.  Patient reports he is currently taking prednisone 25 mg daily.  Patient's prednisone was discussed with Dr. Estanislado Pandy while patient was on the phone.  Patient was advised to try to cut prednisone dose to 20 mg daily if he is able.  Discussed purpose, proper use, and adverse effects of prednisone.  Advised patient to ensure his primary care provider is closely monitoring his blood pressure and blood sugar while on prednisone.  Patient voiced understanding.    Patient will call tomorrow to schedule follow up visit in two weeks.     Elisabeth Most, Pharm.D., BCPS, CPP Clinical Pharmacist Pager: [phone] Phone: [phone] 02/24/2017 5:40 PM

## 2017-02-24 NOTE — ED Triage Notes (Signed)
Pt in from [facility] with allergic rxn after IV dose of medication. Pt states he has taken medication before, but never in IV form until today. Pt has generalized red rash with small hives, worse on R arm. Pt denies throat swelling or sob, sats 99% on RA. Pt was given 25mg  Benadryl, 125mg  Solumedrol IV PTA. Alert, NAD

## 2017-02-24 NOTE — ED Provider Notes (Signed)
[facility] DEPT Provider Note   CSN: [id number] Arrival date & time: 02/24/17  [id number]     History   Chief Complaint Chief Complaint  Patient presents with  . Allergic Reaction    HPI [name] is a 66 y.o. male.  The history is provided by the patient. No language interpreter was used.  Allergic Reaction  Presenting symptoms: itching and rash   Severity:  Moderate Duration:  1 hour Prior allergic episodes:  No prior episodes Context: medications   Relieved by:  Nothing Worsened by:  Nothing Ineffective treatments:  None tried Pt reports he received arixtra today.  Pt reports he developed a rash after.  Pt was given benadryl and sent here  Past Medical History:  Diagnosis Date  . Anxiety    hx of   . Arthritis   . Asthma   . Clotting disorder (HCC)    DVT both legs   . COPD (chronic obstructive pulmonary disease) ([place])   . DDD (degenerative disc disease), cervical    with UE's paresthesias  . Diverticulosis   . DVT, lower extremity ([place])    bilat  . 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 GERD (gastroesophageal reflux disease)   . Gout   . Heart murmur   . History of measles   . History of shingles   . Hypercholesterolemia   . IBS (irritable bowel syndrome)   . IBS (irritable bowel syndrome)   . Peripheral edema   . Pneumonia    hx of   . Prostate cancer ([place])   . Seizures ([place])    last seizure 20 years ago   . Shortness of breath dyspnea    exertion   . Sleep apnea    not on cpap  . Tubular adenoma of colon 04/2008    Patient Active Problem List   Diagnosis Date Noted  . High risk medication use 12/29/2016  . History of prostate cancer 12/29/2016  . Primary osteoarthritis of both knees 12/29/2016  . Chronic idiopathic gout involving toe without tophus 12/29/2016  . History of CHF (congestive heart failure) 12/29/2016  . History of COPD 12/29/2016  . Plantar pustular psoriasis  12/29/2016  . DVT (deep venous thrombosis) ([place]) 10/31/2016  . COPD (chronic obstructive pulmonary disease) ([place]) 10/31/2016  . CHF (congestive heart failure) ([place]) 10/31/2016  . Prostate cancer ([place]) 08/30/2015  . Malignant neoplasm of prostate ([place]) 07/04/2015  . Chest pain at rest 07/05/2014  . Weakness 07/05/2014  . Complicated postphlebitic syndrome 11/16/2013  . Personal history of DVT (deep vein thrombosis) 05/18/2013  . Chronic anticoagulation 05/18/2013  . Diverticulosis of colon without hemorrhage 05/18/2013  . Rheumatoid arthritis ([place]) 05/18/2013  . Seizure disorder ([place]) 05/18/2013  . Hx of adenomatous colonic polyps 05/18/2013  . GERD 05/08/2010  . NAUSEA 05/08/2010  . FLATULENCE-GAS-BLOATING 05/08/2010    Past Surgical History:  Procedure Laterality Date  . CHOLECYSTECTOMY    . COLONOSCOPY    . DOPPLER ECHOCARDIOGRAPHY N/A 01-21-2012   TECHNICALLY DIFFICULT. MILD CONCENTRIC LV HYPERTROPHY. LV CAVITY IS SMALL.. EF=> 55%. TRANSMITRAL SPECTRAL FLOW PATTREN IS SUGGESTIVE OF IMPAIRED LV RELAXATION. RV SYSTOLIC PRESSURE IS [id number]. LEFT ATRIAL SIZE IS NORMAL. AV APPEARS MILDLY SCLEROTIC. NO SIGN VALVE DISEASE NOTED.  [name] LYMPHADENECTOMY Bilateral 08/30/2015   Procedure: PELVIC LYMPHADENECTOMY;  Surgeon: [name], MD;  Location: [facility];  Service: Urology;  Laterality: Bilateral;  . NUCLEAR STRESS TEST  N/A 01-21-2012   NORMAL PATTERN OF PERFUSION IN ALL REGIONS. POST STRESS LV SIZE IS NORMAL. NO EVIDENCE OF INDUCIBLE ISCHEMIA. EF 54%.  [name] POLYPECTOMY    . PROSTATE BIOPSY    . ROBOT ASSISTED LAPAROSCOPIC RADICAL PROSTATECTOMY N/A 08/30/2015   Procedure: ROBOTIC ASSISTED LAPAROSCOPIC RADICAL PROSTATECTOMY;  Surgeon: [name], MD;  Location: [facility];  Service: Urology;  Laterality: N/A;  . US VENOUS LOWER EXT Right 03/07/11   PERSISTENT DVT IN RIGHT LOWER EXT.WITH PERSISTANT VISUALIZATION OF HYPOECHOIC THROMBUS WITHIN THE FEMORAL, PROFUNDA FEMORAL AND POPLITEAL VEINS.  WHEN COMPARED TO PREVIOUS, CLOT IS NO LONGER IDENTIFIED WITH IN THE RIGHT CFV.       Home Medications    Prior to Admission medications   Medication Sig Start Date End Date Taking? Authorizing Provider  albuterol (PROVENTIL) 2 MG tablet TAKE 1 TABLET BY MOUTH TWO TIMES DAILY AS NEEDED shortness of breath 06/07/16  Yes Historical Provider, MD  allopurinol (ZYLOPRIM) 100 MG tablet TAKE 1 TABLET BY MOUTH TWICE A DAY 01/06/17  Yes Bo Merino, MD  dicyclomine (BENTYL) 10 MG capsule TAKE 4 CAPSULES (40 MG TOTAL) BY MOUTH 4 (FOUR) TIMES DAILY - BEFORE MEALS AND AT BEDTIME. 01/20/17  Yes Ladene Artist, MD  docusate sodium (COLACE) 100 MG capsule Take 100 mg by mouth 2 (two) times daily as needed for mild constipation.    Yes Historical Provider, MD  folic acid (FOLVITE) 1 MG tablet Take 2 mg by mouth every morning. 09/16/16  Yes Historical Provider, MD  furosemide (LASIX) 40 MG tablet Take 1 tablet (40 mg total) by mouth 2 (two) times daily. 05/29/16  Yes Mihai Croitoru, MD  omeprazole (PRILOSEC) 40 MG capsule Take 40 mg by mouth 2 (two) times daily.  09/23/16  Yes Historical Provider, MD  Oxymetazoline HCl (NASAL SPRAY NA) Place 2 sprays into the nose daily as needed (allergies).   Yes Historical Provider, MD  phenytoin (DILANTIN) 100 MG ER capsule Take 100-200 mg by mouth 2 (two) times daily. Take one capsule in the morning and 2 capsules at bedtime   Yes Historical Provider, MD  SYMBICORT 160-4.5 MCG/ACT inhaler Inhale 2 puffs into the lungs 2 (two) times daily. 10/07/16  Yes Historical Provider, MD  warfarin (COUMADIN) 3 MG tablet Take 3 mg by mouth at bedtime.    Yes Historical Provider, MD  leflunomide (ARAVA) 20 MG tablet TAKE 1 TABLET (20 MG TOTAL) BY MOUTH DAILY. Patient not taking: Reported on 02/04/2017 01/20/17   Bo Merino, MD  potassium chloride SA (K-DUR,KLOR-CON) 20 MEQ tablet Take 2 tablets (40 mEq total) by mouth daily. 01/18/17 01/25/17  Fatima Blank, MD    Family  History Family History  Problem Relation Age of Onset  . Skin cancer Father   . Stomach cancer Father   . Heart attack Father   . Alzheimer's disease Mother   . Throat cancer Brother   . Stomach cancer Brother   . Lung cancer Brother   . Heart attack Brother   . Heart attack Brother   . Breast cancer Sister   . Heart attack Sister   . Stroke Sister   . Bladder Cancer Sister   . Heart murmur Sister   . Colon cancer Neg Hx   . Colon polyps Neg Hx     Social History Social History  Substance Use Topics  . Smoking status: Former Smoker    Packs/day: 3.00    Years: 20.00    Types: Cigarettes  Quit date: 12/30/1989  . Smokeless tobacco: Never Used  . Alcohol use No     Allergies   Orencia [abatacept]; Carbamazepine; Celecoxib; Cephalexin; Enbrel [etanercept]; Humira [adalimumab]; Levofloxacin; and Sulfa antibiotics   Review of Systems Review of Systems  Skin: Positive for itching and rash.  All other systems reviewed and are negative.    Physical Exam Updated Vital Signs BP 137/83 (BP Location: Right Arm)   Pulse 83   Resp 16   Ht 5\' 9"  (1.753 m)   Wt 101.6 kg   SpO2 93%   BMI 33.08 kg/m   Physical Exam  Constitutional: He appears well-developed and well-nourished.  HENT:  Head: Normocephalic and atraumatic.  Eyes: Conjunctivae are normal.  Neck: Neck supple.  Cardiovascular: Normal rate and regular rhythm.   No murmur heard. Pulmonary/Chest: Effort normal and breath sounds normal. No respiratory distress.  Abdominal: Soft. There is no tenderness.  Musculoskeletal: He exhibits no edema.  Neurological: He is alert.  Skin: Skin is warm and dry.  Hives full body,  Itching    Psychiatric: He has a normal mood and affect.  Nursing note and vitals reviewed.    ED Treatments / Results  Labs (all labs ordered are listed, but only abnormal results are displayed) Labs Reviewed - No data to display  EKG  EKG Interpretation None       Radiology No  results found.  Procedures Procedures (including critical care time)  Medications Ordered in ED Medications  predniSONE (DELTASONE) tablet 30 mg (not administered)  famotidine (PEPCID) IVPB 20 mg premix (0 mg Intravenous Stopped 02/24/17 1136)     Initial Impression / Assessment and Plan / ED Course  I have reviewed the triage vital signs and the nursing notes.  Pertinent labs & imaging results that were available during my care of the patient were reviewed by me and considered in my medical decision making (see chart for details).     Pt given pepcid IV,  Pt observed x2.5 hours.  Rash resolved.  Pt feels much better and wants to go home.  Pt advised to continue benadryl and pepcid.    Final Clinical Impressions(s) / ED Diagnoses   Final diagnoses:  Allergic reaction, initial encounter    New Prescriptions Discharge Medication List as of 02/24/2017 12:26 PM    An After Visit Summary was printed and given to the patient.    Hollace Kinnier [place], PA-C 02/24/17 [place], MD 02/24/17 [phone]

## 2017-02-25 ENCOUNTER — Telehealth: Payer: Self-pay | Admitting: Rheumatology

## 2017-02-25 NOTE — Telephone Encounter (Signed)
Offer him Monday morning 03/03/17 @ 8:30 am

## 2017-02-25 NOTE — Telephone Encounter (Signed)
Done. Patient is scheduled

## 2017-02-25 NOTE — Telephone Encounter (Signed)
Patient called stating that Dr. Estanislado Pandy wanted him to be seen in the next two weeks due to his allergic reaction to the infusion yesterday.  He would need to be a work in since her schedule is pretty full.  [id number].  Thank you.

## 2017-02-25 NOTE — Progress Notes (Signed)
Please notify PCP and forward the lab results

## 2017-02-27 DIAGNOSIS — M19072 Primary osteoarthritis, left ankle and foot: Secondary | ICD-10-CM | POA: Insufficient documentation

## 2017-02-27 DIAGNOSIS — Z8669 Personal history of other diseases of the nervous system and sense organs: Secondary | ICD-10-CM | POA: Insufficient documentation

## 2017-02-27 DIAGNOSIS — M19071 Primary osteoarthritis, right ankle and foot: Secondary | ICD-10-CM | POA: Insufficient documentation

## 2017-02-27 DIAGNOSIS — M19042 Primary osteoarthritis, left hand: Secondary | ICD-10-CM

## 2017-02-27 DIAGNOSIS — M1009 Idiopathic gout, multiple sites: Secondary | ICD-10-CM | POA: Insufficient documentation

## 2017-02-27 DIAGNOSIS — M19041 Primary osteoarthritis, right hand: Secondary | ICD-10-CM | POA: Insufficient documentation

## 2017-02-27 NOTE — Progress Notes (Signed)
Office Visit Note  Patient: [name]             Date of Birth: 1951/11/19           MRN: [id number]             PCP: Purvis Kilts, MD Referring: [name], MD Visit Date: 03/03/2017 Occupation: '@GUAROCC' @    Subjective:  Joint pain and swelling   History of Present Illness: [name] is a 66 y.o. male with history of rheumatoid arthritis. He states that some he is having increased pain and swelling in almost all of his joints. He is on prednisone 25 mg a day. He was on [place] combination. [name]  was was discontinued in January as he developed sore throat and he did not resume. He was on IV Orencia but developed a infusion reaction and anaphylaxis. Orencia was discontinued. Despite of him prednisone 25 mg he continues to have pain and stiffness in multiple joints. He also reports pedal edema and intermittent sore on his right lower extremity.  Activities of Daily Living:  Patient reports morning stiffness for 2 hours.   Patient Denies nocturnal pain.  Difficulty dressing/grooming: Denies Difficulty climbing stairs: Denies Difficulty getting out of chair: Denies Difficulty using hands for taps, buttons, cutlery, and/or writing: Reports   Review of Systems  Constitutional: Positive for fatigue and weight gain. Negative for night sweats and weakness ( ).  HENT: Negative for mouth sores, mouth dryness and nose dryness.   Eyes: Negative for redness and dryness.  Respiratory: Positive for shortness of breath. Negative for difficulty breathing.        History of COPD  Cardiovascular: Negative for chest pain, palpitations, hypertension, irregular heartbeat and swelling in legs/feet.  Gastrointestinal: Negative for constipation and diarrhea.  Endocrine: Negative for increased urination.  Musculoskeletal: Positive for arthralgias, joint pain, joint swelling and morning stiffness. Negative for myalgias, muscle weakness, muscle tenderness and myalgias.    Skin: Negative for color change, rash, hair loss, nodules/bumps, skin tightness, ulcers and sensitivity to sunlight.  Allergic/Immunologic: Negative for susceptible to infections.  Neurological: Negative for dizziness, fainting, memory loss and night sweats.  Hematological: Negative for swollen glands.  Psychiatric/Behavioral: Positive for sleep disturbance. Negative for depressed mood. The patient is not nervous/anxious.     PMFS History:  Patient Active Problem List   Diagnosis Date Noted  . History of seizure disorder 02/27/2017  . Primary osteoarthritis of both hands 02/27/2017  . Primary osteoarthritis of both feet 02/27/2017  . Idiopathic chronic gout of multiple sites without tophus 02/27/2017  . High risk medication use 12/29/2016  . History of prostate cancer 12/29/2016  . Primary osteoarthritis of both knees 12/29/2016  . Chronic idiopathic gout involving toe without tophus 12/29/2016  . History of CHF (congestive heart failure) 12/29/2016  . History of COPD 12/29/2016  . Plantar pustular psoriasis 12/29/2016  . DVT (deep venous thrombosis) ([place]) 10/31/2016  . COPD (chronic obstructive pulmonary disease) ([place]) 10/31/2016  . CHF (congestive heart failure) ([place]) 10/31/2016  . Prostate cancer ([place]) 08/30/2015  . Malignant neoplasm of prostate ([place]) 07/04/2015  . Chest pain at rest 07/05/2014  . Weakness 07/05/2014  . Complicated postphlebitic syndrome 11/16/2013  . Personal history of DVT (deep vein thrombosis) 05/18/2013  . Chronic anticoagulation 05/18/2013  . Diverticulosis of colon without hemorrhage 05/18/2013  . Rheumatoid arthritis ([place]) 05/18/2013  . Seizure disorder ([place]) 05/18/2013  . Hx of adenomatous colonic polyps 05/18/2013  . GERD 05/08/2010  .  NAUSEA 05/08/2010  . FLATULENCE-GAS-BLOATING 05/08/2010    Past Medical History:  Diagnosis Date  . Anxiety    hx of   . Arthritis   . Asthma   . Clotting disorder (HCC)    DVT both legs   . COPD (chronic  obstructive pulmonary disease) ([place])   . DDD (degenerative disc disease), cervical    with UE's paresthesias  . Diverticulosis   . DVT, lower extremity ([place])    bilat  . 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 GERD (gastroesophageal reflux disease)   . Gout   . Heart murmur   . History of measles   . History of shingles   . Hypercholesterolemia   . IBS (irritable bowel syndrome)   . IBS (irritable bowel syndrome)   . Peripheral edema   . Pneumonia    hx of   . Prostate cancer ([place])   . Seizures ([place])    last seizure 20 years ago   . Shortness of breath dyspnea    exertion   . Sleep apnea    not on cpap  . Tubular adenoma of colon 04/2008    Family History  Problem Relation Age of Onset  . Skin cancer Father   . Stomach cancer Father   . Heart attack Father   . Alzheimer's disease Mother   . Throat cancer Brother   . Stomach cancer Brother   . Lung cancer Brother   . Heart attack Brother   . Heart attack Brother   . Breast cancer Sister   . Heart attack Sister   . Stroke Sister   . Bladder Cancer Sister   . Heart murmur Sister   . Colon cancer Neg Hx   . Colon polyps Neg Hx    Past Surgical History:  Procedure Laterality Date  . CHOLECYSTECTOMY    . COLONOSCOPY    . DOPPLER ECHOCARDIOGRAPHY N/A 01-21-2012   TECHNICALLY DIFFICULT. MILD CONCENTRIC LV HYPERTROPHY. LV CAVITY IS SMALL.. EF=> 55%. TRANSMITRAL SPECTRAL FLOW PATTREN IS SUGGESTIVE OF IMPAIRED LV RELAXATION. RV SYSTOLIC PRESSURE IS 48GQBV. LEFT ATRIAL SIZE IS NORMAL. AV APPEARS MILDLY SCLEROTIC. NO SIGN VALVE DISEASE NOTED.  [name] LYMPHADENECTOMY Bilateral 08/30/2015   Procedure: PELVIC LYMPHADENECTOMY;  Surgeon: [name], MD;  Location: [facility];  Service: Urology;  Laterality: Bilateral;  . NUCLEAR STRESS TEST N/A 01-21-2012   NORMAL PATTERN OF PERFUSION IN ALL REGIONS. POST STRESS LV SIZE IS NORMAL. NO EVIDENCE OF INDUCIBLE ISCHEMIA. EF 54%.    [name] POLYPECTOMY    . PROSTATE BIOPSY    . ROBOT ASSISTED LAPAROSCOPIC RADICAL PROSTATECTOMY N/A 08/30/2015   Procedure: ROBOTIC ASSISTED LAPAROSCOPIC RADICAL PROSTATECTOMY;  Surgeon: [name], MD;  Location: [facility];  Service: Urology;  Laterality: N/A;  . US VENOUS LOWER EXT Right 03/07/11   PERSISTENT DVT IN RIGHT LOWER EXT.WITH PERSISTANT VISUALIZATION OF HYPOECHOIC THROMBUS WITHIN THE FEMORAL, PROFUNDA FEMORAL AND POPLITEAL VEINS. WHEN COMPARED TO PREVIOUS, CLOT IS NO LONGER IDENTIFIED WITH IN THE RIGHT CFV.   Social History   Social History Narrative  . No narrative on file     Objective: Vital Signs: BP (!) 142/72   Pulse 80   Resp 18   Wt 234 lb (106.1 kg)   BMI 34.56 kg/m    Physical Exam  Constitutional: He is oriented to person, place, and time. He appears well-developed and well-nourished.  HENT:  Head: Normocephalic and  atraumatic.  Eyes: Conjunctivae and EOM are normal. Pupils are equal, round, and reactive to light.  Neck: Normal range of motion. Neck supple.  Cardiovascular: Normal rate, regular rhythm and normal heart sounds.   Pulmonary/Chest: Effort normal and breath sounds normal.  Abdominal: Soft. Bowel sounds are normal.  Neurological: He is alert and oriented to person, place, and time.  Skin: Skin is warm and dry. Capillary refill takes less than 2 seconds. There is erythema.  Pedal edema bilateral lower extremities with small area on right lower extremity which appears to be ulcerated.  Psychiatric: He has a normal mood and affect. His behavior is normal.  Nursing note and vitals reviewed.    Musculoskeletal Exam: C-spine and thoracic lumbar spine good range of motion. Shoulder joints elbow joints good range of motion. Wrists joints Limited range of motion but without synovitis. There was positive synovitis over MCPs and PIP joints as described below. He has discomfort with range of motion of knee joints and ankle joints but no synovitis was  noted.  CDAI Exam: CDAI Homunculus Exam:   Tenderness:  Right hand: 1st MCP, 2nd PIP, 3rd PIP, 4th PIP and 5th PIP Left hand: 1st MCP, 2nd MCP, 2nd PIP, 3rd PIP, 4th PIP and 5th PIP  Swelling:  Right hand: 1st MCP, 2nd PIP, 3rd PIP, 4th PIP and 5th PIP Left hand: 1st MCP, 2nd MCP, 2nd PIP, 3rd PIP, 4th PIP and 5th PIP  Joint Counts:  CDAI Tender Joint count: 11 CDAI Swollen Joint count: 11  Global Assessments:  Patient Global Assessment: 7 Provider Global Assessment: 7  CDAI Calculated Score: 36    Investigation: No additional findings.  Hospital Outpatient Visit on 02/24/2017  Component Date Value Ref Range Status  . WBC 02/24/2017 12.8* 4.0 - 10.5 K/uL Final  . RBC 02/24/2017 5.21  4.22 - 5.81 MIL/uL Final  . Hemoglobin 02/24/2017 15.3  13.0 - 17.0 g/dL Final  . HCT 02/24/2017 45.8  39.0 - 52.0 % Final  . MCV 02/24/2017 87.9  78.0 - 100.0 fL Final  . MCH 02/24/2017 29.4  26.0 - 34.0 pg Final  . MCHC 02/24/2017 33.4  30.0 - 36.0 g/dL Final  . RDW 02/24/2017 16.0* 11.5 - 15.5 % Final  . Platelets 02/24/2017 238  150 - 400 K/uL Final  . Sodium 02/24/2017 138  135 - 145 mmol/L Final  . Potassium 02/24/2017 3.1* 3.5 - 5.1 mmol/L Final  . Chloride 02/24/2017 99* 101 - 111 mmol/L Final  . CO2 02/24/2017 28  22 - 32 mmol/L Final  . Glucose, Bld 02/24/2017 105* 65 - 99 mg/dL Final  . BUN 02/24/2017 15  6 - 20 mg/dL Final  . Creatinine, Ser 02/24/2017 0.92  0.61 - 1.24 mg/dL Final  . Calcium 02/24/2017 8.8* 8.9 - 10.3 mg/dL Final  . Total Protein 02/24/2017 6.6  6.5 - 8.1 g/dL Final  . Albumin 02/24/2017 3.3* 3.5 - 5.0 g/dL Final  . AST 02/24/2017 33  15 - 41 U/L Final  . ALT 02/24/2017 37  17 - 63 U/L Final  . Alkaline Phosphatase 02/24/2017 94  38 - 126 U/L Final  . Total Bilirubin 02/24/2017 0.7  0.3 - 1.2 mg/dL Final  . GFR calc non Af Amer 02/24/2017 >60  >60 mL/min Final  . GFR calc Af Amer 02/24/2017 >60  >60 mL/min Final   Comment: (NOTE) The eGFR has been  calculated using the CKD EPI equation. This calculation has not been validated in all clinical situations. eGFR's  persistently <60 mL/min signify possible Chronic Kidney Disease.   . Anion gap 02/24/2017 11  5 - 15 Final  Admission on 01/18/2017, Discharged on 01/18/2017  Component Date Value Ref Range Status  . Sodium 01/18/2017 137  135 - 145 mmol/L Final  . Potassium 01/18/2017 2.6* 3.5 - 5.1 mmol/L Final   Comment: CRITICAL RESULT CALLED TO, READ BACK BY AND VERIFIED WITH: TUTTLE AT 1332 ON 01/18/17 BY MOSLEY,J   . Chloride 01/18/2017 94* 101 - 111 mmol/L Final  . CO2 01/18/2017 30  22 - 32 mmol/L Final  . Glucose, Bld 01/18/2017 151* 65 - 99 mg/dL Final  . BUN 01/18/2017 13  6 - 20 mg/dL Final  . Creatinine, Ser 01/18/2017 1.14  0.61 - 1.24 mg/dL Final  . Calcium 01/18/2017 8.7* 8.9 - 10.3 mg/dL Final  . GFR calc non Af Amer 01/18/2017 >60  >60 mL/min Final  . GFR calc Af Amer 01/18/2017 >60  >60 mL/min Final   Comment: (NOTE) The eGFR has been calculated using the CKD EPI equation. This calculation has not been validated in all clinical situations. eGFR's persistently <60 mL/min signify possible Chronic Kidney Disease.   . Anion gap 01/18/2017 13  5 - 15 Final  . WBC 01/18/2017 6.8  4.0 - 10.5 K/uL Final  . RBC 01/18/2017 5.58  4.22 - 5.81 MIL/uL Final  . Hemoglobin 01/18/2017 16.2  13.0 - 17.0 g/dL Final  . HCT 01/18/2017 48.7  39.0 - 52.0 % Final  . MCV 01/18/2017 87.3  78.0 - 100.0 fL Final  . MCH 01/18/2017 29.0  26.0 - 34.0 pg Final  . MCHC 01/18/2017 33.3  30.0 - 36.0 g/dL Final  . RDW 01/18/2017 15.0  11.5 - 15.5 % Final  . Platelets 01/18/2017 184  150 - 400 K/uL Final  . Color, Urine 01/18/2017 AMBER* YELLOW Final  . APPearance 01/18/2017 CLOUDY* CLEAR Final  . Specific Gravity, Urine 01/18/2017 1.025  1.005 - 1.030 Final  . pH 01/18/2017 5.0  5.0 - 8.0 Final  . Glucose, UA 01/18/2017 NEGATIVE  NEGATIVE mg/dL Final  . Hgb urine dipstick 01/18/2017 NEGATIVE   NEGATIVE Final  . Bilirubin Urine 01/18/2017 NEGATIVE  NEGATIVE Final  . Ketones, ur 01/18/2017 5* NEGATIVE mg/dL Final  . Protein, ur 01/18/2017 30* NEGATIVE mg/dL Final  . Nitrite 01/18/2017 NEGATIVE  NEGATIVE Final  . Leukocytes, UA 01/18/2017 SMALL* NEGATIVE Final  . RBC / HPF 01/18/2017 6-30  0 - 5 RBC/hpf Final  . WBC, UA 01/18/2017 6-30  0 - 5 WBC/hpf Final  . Bacteria, UA 01/18/2017 FEW* NONE SEEN Final  . Mucous 01/18/2017 PRESENT   Final  . Budding Yeast 01/18/2017 PRESENT   Final  . Hyaline Casts, UA 01/18/2017 PRESENT   Final  . Ca Oxalate Crys, UA 01/18/2017 PRESENT   Final  . B Natriuretic Peptide 01/18/2017 25.0  0.0 - 100.0 pg/mL Final  . Prothrombin Time 01/18/2017 24.9* 11.4 - 15.2 seconds Final  . INR 01/18/2017 2.21   Final    Imaging: No results found.  Speciality Comments: No specialty comments available.    Procedures:  No procedures performed Allergies: Orencia [abatacept]; Carbamazepine; Celecoxib; Cephalexin; Doxycycline; Enbrel [etanercept]; Humira [adalimumab]; Levofloxacin; Sulfa antibiotics; and Sulfasalazine   Assessment / Plan:     Visit Diagnoses: Rheumatoid arthritis involving multiple sites with positive rheumatoid factor ([place]): He is having a flare off medications. He is on prednisone 25 mg by mouth daily. We had detailed discussion regarding side effects of long-term use  of prednisone monitoring diet and also blood glucose levels was discussed. He will have to closely follow up with the PCP for monitoring the blood sugar. He had to come off Orencia due to anaphylactic action. The plan is to resume Arava 20 mg by mouth daily and add methotrexate about a month later. In the past when he took methotrexate he developed a cough I'm uncertain if it was related to the methotrexate reaction or an occasional bronchitis. I plan on adding subcutaneous methotrexate a month after receiving Arava. He is also aware of tapering prednisone by 2.5 mg every week if his  symptoms improve. With his history of prostate cancer the choices are very limited. I would also like to refer him to a tertiary care center for second opinion regarding his future therapy.   Pedal edema and a sore on his right lower extremity. It does not look infected at this time and his wife is applying dressing and keeping it clean currently. She will continue to monitor it and will let me know if there are any changes.  History of COPD: Stable. He does have some shortness of breath.  History of prostate cancer: Followed up by oncology.  History of CHF (congestive heart failure): That remits the choice of therapy as well  High risk medication use: He is on prednisone 25 mg by mouth daily. We will resume Arava 20 mg by mouth daily and check labs in a month with the labs are stable and his lower extremity ulcer is healing well then I will add methotrexate 0.4 mL subcutaneous every week along with folic acid.  Personal history of DVT (deep vein thrombosis)  Primary osteoarthritis of both knees  History of seizure disorder    Orders: Orders Placed This Encounter  Procedures  . CBC with Differential/Platelet  . COMPLETE METABOLIC PANEL WITH GFR   No orders of the defined types were placed in this encounter.   Face-to-face time spent with patient was 35 minutes. 50% of time was spent in counseling and coordination of care.  Follow-Up Instructions: Return in about 4 weeks (around 03/31/2017) for Rheumatoid arthritis.   Bo Merino, MD  Note - This record has been created using Editor, commissioning.  Chart creation errors have been sought, but may not always  have been located. Such creation errors do not reflect on  the standard of medical care.

## 2017-02-28 ENCOUNTER — Encounter: Payer: Self-pay | Admitting: Rheumatology

## 2017-03-03 ENCOUNTER — Encounter: Payer: Self-pay | Admitting: Rheumatology

## 2017-03-03 ENCOUNTER — Ambulatory Visit (INDEPENDENT_AMBULATORY_CARE_PROVIDER_SITE_OTHER): Payer: Medicare Other | Admitting: Rheumatology

## 2017-03-03 VITALS — BP 142/72 | HR 80 | Resp 18 | Wt 234.0 lb

## 2017-03-03 DIAGNOSIS — Z8546 Personal history of malignant neoplasm of prostate: Secondary | ICD-10-CM

## 2017-03-03 DIAGNOSIS — M1A09X0 Idiopathic chronic gout, multiple sites, without tophus (tophi): Secondary | ICD-10-CM

## 2017-03-03 DIAGNOSIS — Z8669 Personal history of other diseases of the nervous system and sense organs: Secondary | ICD-10-CM

## 2017-03-03 DIAGNOSIS — Z79899 Other long term (current) drug therapy: Secondary | ICD-10-CM | POA: Diagnosis not present

## 2017-03-03 DIAGNOSIS — L408 Other psoriasis: Secondary | ICD-10-CM

## 2017-03-03 DIAGNOSIS — M0579 Rheumatoid arthritis with rheumatoid factor of multiple sites without organ or systems involvement: Secondary | ICD-10-CM

## 2017-03-03 DIAGNOSIS — Z8679 Personal history of other diseases of the circulatory system: Secondary | ICD-10-CM

## 2017-03-03 DIAGNOSIS — Z8709 Personal history of other diseases of the respiratory system: Secondary | ICD-10-CM

## 2017-03-03 DIAGNOSIS — Z86718 Personal history of other venous thrombosis and embolism: Secondary | ICD-10-CM

## 2017-03-03 DIAGNOSIS — L403 Pustulosis palmaris et plantaris: Secondary | ICD-10-CM

## 2017-03-03 DIAGNOSIS — M19041 Primary osteoarthritis, right hand: Secondary | ICD-10-CM

## 2017-03-03 DIAGNOSIS — M170 Bilateral primary osteoarthritis of knee: Secondary | ICD-10-CM | POA: Diagnosis not present

## 2017-03-03 DIAGNOSIS — M19042 Primary osteoarthritis, left hand: Secondary | ICD-10-CM

## 2017-03-03 DIAGNOSIS — M19071 Primary osteoarthritis, right ankle and foot: Secondary | ICD-10-CM | POA: Diagnosis not present

## 2017-03-03 DIAGNOSIS — M19072 Primary osteoarthritis, left ankle and foot: Secondary | ICD-10-CM

## 2017-03-03 NOTE — Patient Instructions (Signed)
Restart leflunomide 20 mg daily.  Get repeat lab work in 1 month.     Methotrexate subcutaneous injection What is this medicine? METHOTREXATE (METH oh TREX ate) is a cytotoxic drug that also suppresses the immune system. It is used to treat psoriasis and rheumatoid arthritis. This medicine may be used for other purposes; ask your health care provider or pharmacist if you have questions. COMMON BRAND NAME(S): Otrexup, Rasuvo What should I tell my health care provider before I take this medicine? They need to know if you have any of these conditions: -fluid in the stomach area or lungs -if you often drink alcohol -infection or immune system problems -kidney disease -liver disease -low blood counts, like low white cell, platelet, or red cell counts -lung disease -radiation therapy -stomach ulcers -ulcerative colitis -an unusual or allergic reaction to methotrexate, other medicines, foods, dyes, or preservatives -pregnant or trying to get pregnant -breast-feeding How should I use this medicine? This medicine is for injection under the skin. You will be taught how to prepare and give this medicine. Refer to the Instructions for Use that come with your medication packaging. Use exactly as directed. Take your medicine at regular intervals. Do not take your medicine more often than directed. This medicine should be taken weekly, NOT daily. It is important that you put your used needles and syringes in a special sharps container. Do not put them in a trash can. If you do not have a sharps container, call your pharmacist of healthcare provider to get one. Talk to your pediatrician regarding the use of this medicine in children. While this drug may be prescribed for children as young as 2 years for selected conditions, precautions do apply. Overdosage: If you think you have taken too much of this medicine contact a poison control center or emergency room at once. NOTE: This medicine is only for you.  Do not share this medicine with others. What if I miss a dose? If you are not sure if this medicine was injected or if you have a hard time giving the injection, do not inject another dose. Talk with your doctor or health care professional. What may interact with this medicine? This medicine may interact with the following medications: -acitretin -aspirin or aspirin-like medicines including salicylates -azathioprine -certain antibiotics like chloramphenicol, penicillin, tetracycline -cyclosporine -gold -hydroxychloroquine -live virus vaccines -mercaptopurine -NSAIDs, medicines for pain and inflammation, like ibuprofen or naproxen -other cytotoxic agents -penicillamine -phenylbutazone -phenytoin -probenacid -retinoids such as isotretinoin and tretinoin -steroid medicines like prednisone or cortisone -sulfonamides like sulfasalazine and trimethoprim/sulfamethoxazole -theophylline This list may not describe all possible interactions. Give your health care provider a list of all the medicines, herbs, non-prescription drugs, or dietary supplements you use. Also tell them if you smoke, drink alcohol, or use illegal drugs. Some items may interact with your medicine. What should I watch for while using this medicine? Avoid alcoholic drinks. This medicine can make you more sensitive to the sun. Keep out of the sun. If you cannot avoid being in the sun, wear protective clothing and use sunscreen. Do not use sun lamps or tanning beds/booths. You may get drowsy or dizzy. Do not drive, use machinery, or do anything that needs mental alertness until you know how this medicine affects you. Do not stand or sit up quickly, especially if you are an older patient. This reduces the risk of dizzy or fainting spells. You may need blood work done while you are taking this medicine. Call your doctor or health  care professional for advice if you get a fever, chills or sore throat, or other symptoms of a cold or  flu. Do not treat yourself. This drug decreases your body's ability to fight infections. Try to avoid being around people who are sick. This medicine may increase your risk to bruise or bleed. Call your doctor or health care professional if you notice any unusual bleeding. Check with your doctor or health care professional if you get an attack of severe diarrhea, nausea and vomiting, or if you sweat a lot. The loss of too much body fluid can make it dangerous for you to take this medicine. Talk to your doctor about your risk of cancer. You may be more at risk for certain types of cancers if you take this medicine. Both men and women must use effective birth control with this medicine. Do not become pregnant while taking this medicine or until at least 1 normal menstrual cycle has occurred after stopping it. Women should inform their doctor if they wish to become pregnant or think they might be pregnant. Men should not father a child while taking this medicine and for 3 months after stopping it. There is a potential for serious side effects to an unborn child. Talk to your health care professional or pharmacist for more information. Do not breast-feed an infant while taking this medicine. What side effects may I notice from receiving this medicine? Side effects that you should report to your doctor or health care professional as soon as possible: -allergic reactions like skin rash, itching or hives, swelling of the face, lips, or tongue -breathing problems or shortness of breath -diarrhea -dry, nonproductive cough -low blood counts - this medicine may decrease the number of white blood cells, red blood cells and platelets. You may be at increased risk of infections and bleeding -mouth sores -redness, blistering, peeling or loosening of the skin, including inside the mouth -signs of infection - fever or chills, cough, sore throat, pain or difficulty passing urine -signs and symptoms of bleeding such as  bloody or black, tarry stools; red or dark-brown urine; spitting up blood or brown material that looks like coffee grounds; red spots on the skin; unusual bruising or bleeding from the eye, gums, or nose -signs and symptoms of kidney injury like trouble passing urine or change in the amount of urine -signs and symptoms of liver injury like dark yellow or brown urine; general ill feeling or flu-like symptoms; light-colored stools; loss of appetite; nausea; right upper belly pain; unusually weak or tired; yellowing of the eyes or skin Side effects that usually do not require medical attention (report to your doctor or health care professional if they continue or are bothersome): -dizziness -hair loss -headache -stomach pain -upset stomach -vomiting This list may not describe all possible side effects. Call your doctor for medical advice about side effects. You may report side effects to FDA at 1-800-FDA-1088. Where should I keep my medicine? Keep out of the reach of children. You will be instructed on how to store this medicine. Throw away any unused medicine after the expiration date on the label. NOTE: This sheet is a summary. It may not cover all possible information. If you have questions about this medicine, talk to your doctor, pharmacist, or health care provider.  2018 [employer]vier/Gold Standard (2016-01-18 11:50:46)

## 2017-03-03 NOTE — Progress Notes (Signed)
Pharmacy Note  Subjective: Patient presents today to the [facility] to see Dr. Estanislado Pandy.  Patient is currently taking prednisone 25 mg daily.  He was taking leflunomide 20 mg daily, but he stopped it back in January due to strep throat infection.  Plan per Dr. Estanislado Pandy is for patient to resume leflunomide 20 mg daily and add methotrexate about a month later.  Patient seen by the pharmacist for counseling on methotrexate.  Noted patient was previously on methotrexate 0.8 mL weekly which was discontinued due to upper respiratory tract infection/cough.    Objective: CBC    Component Value Date/Time   WBC 12.8 (H) 02/24/2017 0904   RBC 5.21 02/24/2017 0904   HGB 15.3 02/24/2017 0904   HCT 45.8 02/24/2017 0904   HCT 43.4 11/25/2016 0824   PLT 238 02/24/2017 0904   PLT 213 11/25/2016 0824   MCV 87.9 02/24/2017 0904   MCV 87 11/25/2016 0824   MCH 29.4 02/24/2017 0904   MCHC 33.4 02/24/2017 0904   RDW 16.0 (H) 02/24/2017 0904   RDW 16.0 (H) 11/25/2016 0824   LYMPHSABS 0.9 11/25/2016 0824   MONOABS 1.0 04/13/2014 1117   EOSABS 0.4 11/25/2016 0824   BASOSABS 0.1 11/25/2016 0824   CMP     Component Value Date/Time   NA 138 02/24/2017 0904   NA 144 11/25/2016 0824   K 3.1 (L) 02/24/2017 0904   CL 99 (L) 02/24/2017 0904   CO2 28 02/24/2017 0904   GLUCOSE 105 (H) 02/24/2017 0904   BUN 15 02/24/2017 0904   BUN 13 11/25/2016 0824   CREATININE 0.92 02/24/2017 0904   CALCIUM 8.8 (L) 02/24/2017 0904   PROT 6.6 02/24/2017 0904   PROT 6.6 11/25/2016 0824   ALBUMIN 3.3 (L) 02/24/2017 0904   ALBUMIN 3.8 11/25/2016 0824   AST 33 02/24/2017 0904   ALT 37 02/24/2017 0904   ALKPHOS 94 02/24/2017 0904   BILITOT 0.7 02/24/2017 0904   BILITOT 0.4 11/25/2016 0824   GFRNONAA >60 02/24/2017 0904   GFRAA >60 02/24/2017 0904    TB: 01/02/17 negative TB skin test Hepatitis panel: negative (06/07/2010)  Chest-xray:  01/18/2017 "IMPRESSION: No active cardiopulmonary disease."    Contraception: Patient's wife is post-menopausal   Assessment/Plan:  Patient was counseled on the purpose, proper use, and adverse effects of methotrexate including nausea, infection, and signs and symptoms of pneumonitis.  Reviewed instructions with patient to take methotrexate weekly along with folic acid daily.  Discussed the importance of frequent monitoring of kidney and liver function and blood counts, especially with combination of leflunomide and methotrexate.  Counseled patient to avoid NSAIDs and alcohol while on methotrexate.  Provided patient with educational materials on methotrexate and answered all questions.  Patient voiced understanding and denies any questions.  Patient consented to methotrexate use.  Will upload into chart.  Plan is initiation of methotrexate one month after restarting leflunomide.   Elisabeth Most, Pharm.D., BCPS Clinical Pharmacist Pager: [phone] Phone: [phone] 03/03/2017 9:02 AM

## 2017-03-04 ENCOUNTER — Telehealth: Payer: Self-pay | Admitting: Rheumatology

## 2017-03-04 NOTE — Telephone Encounter (Signed)
-----   Message from Tyler Pita, MD sent at 03/03/2017  3:26 PM EST ----- Since he completed radiation on 12/13/16, I think it is OK for him to start Methotrexate.    ----- Message ----- From: Bo Merino, MD Sent: 02/28/2017   3:35 PM To: Tyler Pita, MD

## 2017-03-06 DIAGNOSIS — R569 Unspecified convulsions: Secondary | ICD-10-CM | POA: Diagnosis not present

## 2017-03-06 DIAGNOSIS — I8311 Varicose veins of right lower extremity with inflammation: Secondary | ICD-10-CM | POA: Diagnosis not present

## 2017-03-06 DIAGNOSIS — R7309 Other abnormal glucose: Secondary | ICD-10-CM | POA: Diagnosis not present

## 2017-03-06 DIAGNOSIS — I82409 Acute embolism and thrombosis of unspecified deep veins of unspecified lower extremity: Secondary | ICD-10-CM | POA: Diagnosis not present

## 2017-03-06 DIAGNOSIS — G40309 Generalized idiopathic epilepsy and epileptic syndromes, not intractable, without status epilepticus: Secondary | ICD-10-CM | POA: Diagnosis not present

## 2017-03-06 DIAGNOSIS — M069 Rheumatoid arthritis, unspecified: Secondary | ICD-10-CM | POA: Diagnosis not present

## 2017-03-06 DIAGNOSIS — E6609 Other obesity due to excess calories: Secondary | ICD-10-CM | POA: Diagnosis not present

## 2017-03-06 DIAGNOSIS — Z6834 Body mass index (BMI) 34.0-34.9, adult: Secondary | ICD-10-CM | POA: Diagnosis not present

## 2017-03-06 DIAGNOSIS — J449 Chronic obstructive pulmonary disease, unspecified: Secondary | ICD-10-CM | POA: Diagnosis not present

## 2017-03-17 DIAGNOSIS — Z7901 Long term (current) use of anticoagulants: Secondary | ICD-10-CM | POA: Diagnosis not present

## 2017-03-24 ENCOUNTER — Encounter (HOSPITAL_COMMUNITY): Payer: Self-pay | Admitting: Emergency Medicine

## 2017-03-24 ENCOUNTER — Emergency Department (HOSPITAL_COMMUNITY): Payer: Medicare Other

## 2017-03-24 ENCOUNTER — Emergency Department (HOSPITAL_COMMUNITY)
Admission: EM | Admit: 2017-03-24 | Discharge: 2017-03-25 | Disposition: A | Discharge status: Home / Self Care | Payer: Medicare Other | Attending: Emergency Medicine | Admitting: Emergency Medicine

## 2017-03-24 DIAGNOSIS — Z8546 Personal history of malignant neoplasm of prostate: Secondary | ICD-10-CM | POA: Insufficient documentation

## 2017-03-24 DIAGNOSIS — Z87891 Personal history of nicotine dependence: Secondary | ICD-10-CM | POA: Diagnosis not present

## 2017-03-24 DIAGNOSIS — I509 Heart failure, unspecified: Secondary | ICD-10-CM | POA: Diagnosis not present

## 2017-03-24 DIAGNOSIS — J45909 Unspecified asthma, uncomplicated: Secondary | ICD-10-CM | POA: Diagnosis not present

## 2017-03-24 DIAGNOSIS — R197 Diarrhea, unspecified: Secondary | ICD-10-CM | POA: Diagnosis present

## 2017-03-24 DIAGNOSIS — Z7901 Long term (current) use of anticoagulants: Secondary | ICD-10-CM | POA: Insufficient documentation

## 2017-03-24 DIAGNOSIS — Z79899 Other long term (current) drug therapy: Secondary | ICD-10-CM | POA: Insufficient documentation

## 2017-03-24 DIAGNOSIS — J449 Chronic obstructive pulmonary disease, unspecified: Secondary | ICD-10-CM | POA: Diagnosis not present

## 2017-03-24 DIAGNOSIS — K529 Noninfective gastroenteritis and colitis, unspecified: Secondary | ICD-10-CM | POA: Insufficient documentation

## 2017-03-24 DIAGNOSIS — R05 Cough: Secondary | ICD-10-CM | POA: Insufficient documentation

## 2017-03-24 LAB — COMPREHENSIVE METABOLIC PANEL
ALT: 33 U/L (ref 17–63)
AST: 34 U/L (ref 15–41)
Albumin: 3.4 g/dL — ABNORMAL LOW (ref 3.5–5.0)
Alkaline Phosphatase: 109 U/L (ref 38–126)
Anion gap: 9 (ref 5–15)
BUN: 17 mg/dL (ref 6–20)
CO2: 31 mmol/L (ref 22–32)
Calcium: 8.8 mg/dL — ABNORMAL LOW (ref 8.9–10.3)
Chloride: 98 mmol/L — ABNORMAL LOW (ref 101–111)
Creatinine, Ser: 1.14 mg/dL (ref 0.61–1.24)
GFR calc Af Amer: >60 mL/min (ref >60)
GFR calc non Af Amer: >60 mL/min (ref >60)
Glucose, Bld: 149 mg/dL — ABNORMAL HIGH (ref 65–99)
Potassium: 4.3 mmol/L (ref 3.5–5.1)
Sodium: 138 mmol/L (ref 135–145)
Total Bilirubin: 0.9 mg/dL (ref 0.3–1.2)
Total Protein: 6.7 g/dL (ref 6.5–8.1)

## 2017-03-24 LAB — CBC WITH DIFFERENTIAL/PLATELET
Basophils Absolute: 0 10*3/uL (ref 0.0–0.1)
Basophils Relative: 0 %
Eosinophils Absolute: 0 10*3/uL (ref 0.0–0.7)
Eosinophils Relative: 0 %
HCT: 47.1 % (ref 39.0–52.0)
Hemoglobin: 15.8 g/dL (ref 13.0–17.0)
Lymphocytes Relative: 3 %
Lymphs Abs: 0.5 10*3/uL — ABNORMAL LOW (ref 0.7–4.0)
MCH: 30.8 pg (ref 26.0–34.0)
MCHC: 33.5 g/dL (ref 30.0–36.0)
MCV: 91.8 fL (ref 78.0–100.0)
Monocytes Absolute: 0.5 10*3/uL (ref 0.1–1.0)
Monocytes Relative: 4 %
Neutro Abs: 13.6 10*3/uL — ABNORMAL HIGH (ref 1.7–7.7)
Neutrophils Relative %: 93 %
Platelets: 195 10*3/uL (ref 150–400)
RBC: 5.13 MIL/uL (ref 4.22–5.81)
RDW: 15.3 % (ref 11.5–15.5)
WBC: 14.7 10*3/uL — ABNORMAL HIGH (ref 4.0–10.5)

## 2017-03-24 LAB — URINALYSIS, ROUTINE W REFLEX MICROSCOPIC
Bilirubin Urine: NEGATIVE
Glucose, UA: NEGATIVE mg/dL
Hgb urine dipstick: NEGATIVE
Ketones, ur: NEGATIVE mg/dL
Leukocytes, UA: NEGATIVE
Nitrite: NEGATIVE
Protein, ur: NEGATIVE mg/dL
Specific Gravity, Urine: 1.02 (ref 1.005–1.030)
pH: 5.5 (ref 5.0–8.0)

## 2017-03-24 LAB — LIPASE, BLOOD: Lipase: 26 U/L (ref 11–51)

## 2017-03-24 MED ORDER — ONDANSETRON HCL 4 MG/2ML IJ SOLN
4.0000 mg | Freq: Once | INTRAMUSCULAR | Status: AC
  Administered 2017-03-24: 4 mg via INTRAVENOUS
  Filled 2017-03-24: qty 2

## 2017-03-24 MED ORDER — ONDANSETRON HCL 4 MG PO TABS: 4.0000 mg | ORAL_TABLET | Freq: Four times a day (QID) | ORAL | 0 refills | Status: DC

## 2017-03-24 MED ORDER — SODIUM CHLORIDE 0.9 % IV BOLUS (SEPSIS)
1000.0000 mL | Freq: Once | INTRAVENOUS | Status: AC
  Administered 2017-03-25: 1000 mL via INTRAVENOUS

## 2017-03-24 MED ORDER — SODIUM CHLORIDE 0.9 % IV BOLUS (SEPSIS)
1000.0000 mL | Freq: Once | INTRAVENOUS | Status: AC
  Administered 2017-03-24: 1000 mL via INTRAVENOUS

## 2017-03-24 NOTE — ED Triage Notes (Signed)
PT c/o body aches, non-productvie cough, fever, nasal congestion, vomiting x1 and diarrhea x3 that started this am. PT denies any tx for fever at home.

## 2017-03-24 NOTE — ED Provider Notes (Signed)
[facility] DEPT Provider Note   CSN: [id number] Arrival date & time: 03/24/17  1638   By signing my name below, I, [name], attest that this documentation has been prepared under the direction and in the presence of Nat Christen, MD. Electronically signed, [name], ED Scribe. 03/24/17. 10:58 PM.   History   Chief Complaint Chief Complaint  Patient presents with  . Fever   The history is provided by the patient and medical records. No language interpreter was used.    HPI Comments: [name] is a 66 y.o. male who presents to the Emergency Department complaining of nausea since ~3 PM today. He notes associated chills onset soon after nausea, watery diarrhea x 4, vomiting x 1 with some dry heaves, weakness and fatigue; triage notes body aches, dry cough, subjective fever and congestion. Pt's wife states he takes multiple daily medications at home though he and his wife report he has not medications taken any medications at home for his symptoms today. Pt is retired and he notes no sick contacts at home.  Past Medical History:  Diagnosis Date  . Anxiety    hx of   . Arthritis   . Asthma   . Clotting disorder (HCC)    DVT both legs   . COPD (chronic obstructive pulmonary disease) ([place])   . DDD (degenerative disc disease), cervical    with UE's paresthesias  . Diverticulosis   . DVT, lower extremity ([place])    bilat  . 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 GERD (gastroesophageal reflux disease)   . Gout   . Heart murmur   . History of measles   . History of shingles   . Hypercholesterolemia   . IBS (irritable bowel syndrome)   . IBS (irritable bowel syndrome)   . Peripheral edema   . Pneumonia    hx of   . Prostate cancer ([place])   . Seizures ([place])    last seizure 20 years ago   . Shortness of breath dyspnea    exertion   . Sleep apnea    not on cpap  . Tubular adenoma of colon 04/2008     Patient Active Problem List   Diagnosis Date Noted  . History of seizure disorder 02/27/2017  . Primary osteoarthritis of both hands 02/27/2017  . Primary osteoarthritis of both feet 02/27/2017  . Idiopathic chronic gout of multiple sites without tophus 02/27/2017  . High risk medication use 12/29/2016  . History of prostate cancer 12/29/2016  . Primary osteoarthritis of both knees 12/29/2016  . Chronic idiopathic gout involving toe without tophus 12/29/2016  . History of CHF (congestive heart failure) 12/29/2016  . History of COPD 12/29/2016  . Plantar pustular psoriasis 12/29/2016  . DVT (deep venous thrombosis) ([place]) 10/31/2016  . COPD (chronic obstructive pulmonary disease) ([place]) 10/31/2016  . CHF (congestive heart failure) ([place]) 10/31/2016  . Prostate cancer ([place]) 08/30/2015  . Malignant neoplasm of prostate ([place]) 07/04/2015  . Chest pain at rest 07/05/2014  . Weakness 07/05/2014  . Complicated postphlebitic syndrome 11/16/2013  . Personal history of DVT (deep vein thrombosis) 05/18/2013  . Chronic anticoagulation 05/18/2013  . Diverticulosis of colon without hemorrhage 05/18/2013  . Rheumatoid arthritis ([place]) 05/18/2013  . Seizure disorder ([place]) 05/18/2013  . Hx of adenomatous colonic polyps 05/18/2013  . GERD 05/08/2010  . NAUSEA 05/08/2010  . FLATULENCE-GAS-BLOATING 05/08/2010    Past Surgical History:  Procedure Laterality Date  . CHOLECYSTECTOMY    . COLONOSCOPY    . DOPPLER ECHOCARDIOGRAPHY N/A 01-21-2012   TECHNICALLY DIFFICULT. MILD CONCENTRIC LV HYPERTROPHY. LV CAVITY IS SMALL.. EF=> 55%. TRANSMITRAL SPECTRAL FLOW PATTREN IS SUGGESTIVE OF IMPAIRED LV RELAXATION. RV SYSTOLIC PRESSURE IS 07PXTG. LEFT ATRIAL SIZE IS NORMAL. AV APPEARS MILDLY SCLEROTIC. NO SIGN VALVE DISEASE NOTED.  [name] LYMPHADENECTOMY Bilateral 08/30/2015   Procedure: PELVIC LYMPHADENECTOMY;  Surgeon: [name], MD;  Location: [facility] OR[facility];  Service: Urology;  Laterality: Bilateral;  . NUCLEAR  STRESS TEST N/A 01-21-2012   NORMAL PATTERN OF PERFUSION IN ALL REGIONS. POST STRESS LV SIZE IS NORMAL. NO EVIDENCE OF INDUCIBLE ISCHEMIA. EF 54%.  [name] POLYPECTOMY    . PROSTATE BIOPSY    . ROBOT ASSISTED LAPAROSCOPIC RADICAL PROSTATECTOMY N/A 08/30/2015   Procedure: ROBOTIC ASSISTED LAPAROSCOPIC RADICAL PROSTATECTOMY;  Surgeon: [name], MD;  Location: [facility];  Service: Urology;  Laterality: N/A;  . US VENOUS LOWER EXT Right 03/07/11   PERSISTENT DVT IN RIGHT LOWER EXT.WITH PERSISTANT VISUALIZATION OF HYPOECHOIC THROMBUS WITHIN THE FEMORAL, PROFUNDA FEMORAL AND POPLITEAL VEINS. WHEN COMPARED TO PREVIOUS, CLOT IS NO LONGER IDENTIFIED WITH IN THE RIGHT CFV.       Home Medications    Prior to Admission medications   Medication Sig Start Date End Date Taking? Authorizing Provider  albuterol (PROVENTIL) 2 MG tablet Take 2 mg by mouth 2 (two) times daily.   Yes Historical Provider, MD  allopurinol (ZYLOPRIM) 100 MG tablet TAKE 1 TABLET BY MOUTH TWICE A DAY 01/06/17  Yes Bo Merino, MD  dicyclomine (BENTYL) 10 MG capsule TAKE 4 CAPSULES (40 MG TOTAL) BY MOUTH 4 (FOUR) TIMES DAILY - BEFORE MEALS AND AT BEDTIME. Patient taking differently: Take 20 mg by mouth 2 (two) times daily.  01/20/17  Yes Ladene Artist, MD  folic acid (FOLVITE) 1 MG tablet Take 2 mg by mouth every morning. 09/16/16  Yes Historical Provider, MD  furosemide (LASIX) 40 MG tablet Take 1 tablet (40 mg total) by mouth 2 (two) times daily. Patient taking differently: Take 80 mg by mouth at bedtime.  05/29/16  Yes Mihai Croitoru, MD  leflunomide (ARAVA) 20 MG tablet TAKE 1 TABLET (20 MG TOTAL) BY MOUTH DAILY. 01/20/17  Yes Bo Merino, MD  mirabegron ER (MYRBETRIQ) 25 MG TB24 tablet Take 25 mg by mouth daily.   Yes Historical Provider, MD  omeprazole (PRILOSEC) 40 MG capsule Take 40 mg by mouth every evening.  09/23/16  Yes Historical Provider, MD  Oxymetazoline HCl (NASAL SPRAY NA) Place 2 sprays into the nose daily as  needed (allergies).   Yes Historical Provider, MD  phenytoin (DILANTIN) 100 MG ER capsule Take 100-200 mg by mouth 2 (two) times daily. Take one capsule in the morning and 2 capsules at bedtime   Yes Historical Provider, MD  predniSONE (DELTASONE) 10 MG tablet Take 5-20 mg by mouth 2 (two) times daily. 20mg  during the day and 5mg  at bedtime   Yes Historical Provider, MD  PROAIR HFA 108 (90 Base) MCG/ACT inhaler Inhale 1-2 puffs into the lungs every 6 (six) hours as needed.  03/17/17  Yes Historical Provider, MD  SYMBICORT 160-4.5 MCG/ACT inhaler Inhale 2 puffs into the lungs daily as needed.  10/07/16  Yes Historical Provider, MD  warfarin (COUMADIN) 3 MG tablet Take 3 mg by mouth at bedtime.    Yes Historical Provider, MD  ondansetron (ZOFRAN) 4 MG tablet Take 1 tablet (4 mg total) by mouth every  6 (six) hours. 03/24/17   Nat Christen, MD  potassium chloride SA (K-DUR,KLOR-CON) 20 MEQ tablet Take 2 tablets (40 mEq total) by mouth daily. Patient not taking: Reported on 03/24/2017 01/18/17 01/25/17  Fatima Blank, MD    Family History Family History  Problem Relation Age of Onset  . Skin cancer Father   . Stomach cancer Father   . Heart attack Father   . Alzheimer's disease Mother   . Throat cancer Brother   . Stomach cancer Brother   . Lung cancer Brother   . Heart attack Brother   . Heart attack Brother   . Breast cancer Sister   . Heart attack Sister   . Stroke Sister   . Bladder Cancer Sister   . Heart murmur Sister   . Colon cancer Neg Hx   . Colon polyps Neg Hx     Social History Social History  Substance Use Topics  . Smoking status: Former Smoker    Packs/day: 3.00    Years: 20.00    Types: Cigarettes    Quit date: 12/30/1989  . Smokeless tobacco: Never Used  . Alcohol use No     Allergies   Orencia [abatacept]; Carbamazepine; Celecoxib; Cephalexin; Doxycycline; Enbrel [etanercept]; Humira [adalimumab]; Levofloxacin; Sulfa antibiotics; and Sulfasalazine   Review  of Systems Review of Systems  Constitutional: Positive for chills, fatigue and fever.  HENT: Positive for congestion.   Respiratory: Positive for cough.   Gastrointestinal: Positive for diarrhea, nausea and vomiting.  Musculoskeletal: Positive for arthralgias and myalgias.  Neurological: Positive for weakness.  All other systems reviewed and are negative.    Physical Exam Updated Vital Signs BP 118/64   Pulse (!) 101   Temp (!) 100.4 F (38 C) (Oral)   Resp 20   Ht 5\' 9"  (1.753 m)   Wt 234 lb (106.1 kg)   SpO2 96%   BMI 34.56 kg/m   Physical Exam  Constitutional: He is oriented to person, place, and time. He appears well-developed and well-nourished.  Pale but alert; NAD  HENT:  Head: Normocephalic and atraumatic.  Eyes: Conjunctivae are normal.  Neck: Neck supple.  Cardiovascular: Normal rate and regular rhythm.   Pulmonary/Chest: Effort normal and breath sounds normal.  Abdominal: Soft. Bowel sounds are normal.  Musculoskeletal: Normal range of motion.  Neurological: He is alert and oriented to person, place, and time.  Skin: Skin is warm and dry.  Psychiatric: He has a normal mood and affect. His behavior is normal.  Nursing note and vitals reviewed.    ED Treatments / Results  DIAGNOSTIC STUDIES: Oxygen Saturation is 96% on RA, adequate by my interpretation.    COORDINATION OF CARE: 10:33 PM Discussed treatment plan with pt at bedside and pt agreed to plan. Will order fluids and antiemetic medications.  Labs (all labs ordered are listed, but only abnormal results are displayed) Labs Reviewed  CBC WITH DIFFERENTIAL/PLATELET - Abnormal; Notable for the following:       Result Value   WBC 14.7 (*)    Neutro Abs 13.6 (*)    Lymphs Abs 0.5 (*)    All other components within normal limits  COMPREHENSIVE METABOLIC PANEL - Abnormal; Notable for the following:    Chloride 98 (*)    Glucose, Bld 149 (*)    Calcium 8.8 (*)    Albumin 3.4 (*)    All other  components within normal limits  URINALYSIS, ROUTINE W REFLEX MICROSCOPIC  LIPASE, BLOOD    EKG  EKG Interpretation None       Radiology Dg Chest 2 View  Result Date: 03/24/2017 CLINICAL DATA:  Fever, cough, vomiting, diarrhea EXAM: CHEST  2 VIEW COMPARISON:  01/18/2017 FINDINGS: There is elevation of the left diaphragm. There is no focal parenchymal opacity. There is no pleural effusion or pneumothorax. The heart and mediastinal contours are unremarkable. The osseous structures are unremarkable. IMPRESSION: No active cardiopulmonary disease. Electronically Signed   By: Kathreen Devoid   On: 03/24/2017 17:32    Procedures Procedures (including critical care time)  Medications Ordered in ED Medications  sodium chloride 0.9 % bolus 1,000 mL (not administered)  ondansetron (ZOFRAN) injection 4 mg (4 mg Intravenous Given 03/24/17 2315)  sodium chloride 0.9 % bolus 1,000 mL (1,000 mLs Intravenous New Bag/Given 03/24/17 2305)     Initial Impression / Assessment and Plan / ED Course  I have reviewed the triage vital signs and the nursing notes.  Pertinent labs & imaging results that were available during my care of the patient were reviewed by me and considered in my medical decision making (see chart for details).     Patient is feeling much better after 2 L of IV fluids and IV Zofran. Vital signs are stable. Discharge medications Zofran 4 mg  Final Clinical Impressions(s) / ED Diagnoses   Final diagnoses:  Gastroenteritis    New Prescriptions New Prescriptions   ONDANSETRON (ZOFRAN) 4 MG TABLET    Take 1 tablet (4 mg total) by mouth every 6 (six) hours.   I personally performed the services described in this documentation, which was scribed in my presence. The recorded information has been reviewed and is accurate.     Nat Christen, MD 03/24/17 [phone]

## 2017-03-24 NOTE — Discharge Instructions (Signed)
Rest. Increase fluids. Medication for nausea.

## 2017-03-25 DIAGNOSIS — K529 Noninfective gastroenteritis and colitis, unspecified: Secondary | ICD-10-CM | POA: Diagnosis not present

## 2017-03-25 NOTE — ED Notes (Signed)
Pt ambulated to waiting room. Pt verbalized understanding of discharge instructions.

## 2017-03-26 DIAGNOSIS — C61 Malignant neoplasm of prostate: Secondary | ICD-10-CM | POA: Diagnosis not present

## 2017-03-26 NOTE — Progress Notes (Signed)
Office Visit Note  Patient: [name]             Date of Birth: July 07, 1951           MRN: [id number]             PCP: Purvis Kilts, MD Referring: [name], MD Visit Date: 04/02/2017 Occupation: _0 @    Subjective:  Pain hands   History of Present Illness: [name] is a 66 y.o. male with history of rheumatoid arthritis. He states he is doing better on Arava 20 mg a day. He has decreased his prednisone from 25 to 20 mg about 3 weeks ago. He is noticing some stiffness in his hands in the morning. He had a lot of pedal edema for which she was seen by Dr. Hilma Favors and was given some topical agent. According to patient the lesions have improved. He has noticed a rash on his face. None of the other joints are painful.  Activities of Daily Living:  Patient reports morning stiffness for 0 minute.   Patient Denies nocturnal pain.  Difficulty dressing/grooming: Denies Difficulty climbing stairs: Reports due to shortness of breath Difficulty getting out of chair: Denies Difficulty using hands for taps, buttons, cutlery, and/or writing: Denies   Review of Systems  Constitutional: Positive for fatigue. Negative for night sweats and weakness ( ).  HENT: Positive for mouth dryness. Negative for mouth sores and nose dryness.   Eyes: Negative for redness and dryness.  Respiratory: Negative for shortness of breath and difficulty breathing.   Cardiovascular: Negative for chest pain, palpitations, hypertension, irregular heartbeat and swelling in legs/feet.  Gastrointestinal: Negative for constipation and diarrhea.  Endocrine: Negative for increased urination.  Musculoskeletal: Positive for arthralgias and joint pain. Negative for joint swelling, myalgias, muscle weakness, morning stiffness, muscle tenderness and myalgias.  Skin: Positive for rash. Negative for color change, hair loss, nodules/bumps, skin tightness, ulcers and sensitivity to sunlight.    Allergic/Immunologic: Negative for susceptible to infections.  Neurological: Negative for dizziness, fainting, memory loss and night sweats.  Hematological: Negative for swollen glands.  Psychiatric/Behavioral: Positive for sleep disturbance. Negative for depressed mood. The patient is not nervous/anxious.     PMFS History:  Patient Active Problem List   Diagnosis Date Noted  . History of seizure disorder 02/27/2017  . Primary osteoarthritis of both hands 02/27/2017  . Primary osteoarthritis of both feet 02/27/2017  . Idiopathic chronic gout of multiple sites without tophus 02/27/2017  . High risk medication use 12/29/2016  . History of prostate cancer 12/29/2016  . Primary osteoarthritis of both knees 12/29/2016  . Chronic idiopathic gout involving toe without tophus 12/29/2016  . History of CHF (congestive heart failure) 12/29/2016  . History of COPD 12/29/2016  . Plantar pustular psoriasis 12/29/2016  . DVT (deep venous thrombosis) ([place]) 10/31/2016  . COPD (chronic obstructive pulmonary disease) ([place]) 10/31/2016  . CHF (congestive heart failure) ([place]) 10/31/2016  . Prostate cancer ([place]) 08/30/2015  . Malignant neoplasm of prostate ([place]) 07/04/2015  . Chest pain at rest 07/05/2014  . Weakness 07/05/2014  . Complicated postphlebitic syndrome 11/16/2013  . Personal history of DVT (deep vein thrombosis) 05/18/2013  . Chronic anticoagulation 05/18/2013  . Diverticulosis of colon without hemorrhage 05/18/2013  . Rheumatoid arthritis ([place]) 05/18/2013  . Seizure disorder ([place]) 05/18/2013  . Hx of adenomatous colonic polyps 05/18/2013  . GERD 05/08/2010  . NAUSEA 05/08/2010  . FLATULENCE-GAS-BLOATING 05/08/2010    Past Medical History:  Diagnosis Date  .  Anxiety    hx of   . Arthritis   . Asthma   . Clotting disorder (HCC)    DVT both legs   . COPD (chronic obstructive pulmonary disease) ([place])   . DDD (degenerative disc disease), cervical    with UE's paresthesias  .  Diverticulosis   . DVT, lower extremity ([place])    bilat  . 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 GERD (gastroesophageal reflux disease)   . Gout   . Heart murmur   . History of measles   . History of shingles   . Hypercholesterolemia   . IBS (irritable bowel syndrome)   . IBS (irritable bowel syndrome)   . Peripheral edema   . Pneumonia    hx of   . Prostate cancer ([place])   . Seizures ([place])    last seizure 20 years ago   . Shortness of breath dyspnea    exertion   . Sleep apnea    not on cpap  . Tubular adenoma of colon 04/2008    Family History  Problem Relation Age of Onset  . Skin cancer Father   . Stomach cancer Father   . Heart attack Father   . Alzheimer's disease Mother   . Throat cancer Brother   . Stomach cancer Brother   . Lung cancer Brother   . Heart attack Brother   . Heart attack Brother   . Breast cancer Sister   . Heart attack Sister   . Stroke Sister   . Bladder Cancer Sister   . Heart murmur Sister   . Colon cancer Neg Hx   . Colon polyps Neg Hx    Past Surgical History:  Procedure Laterality Date  . CHOLECYSTECTOMY    . COLONOSCOPY    . DOPPLER ECHOCARDIOGRAPHY N/A 01-21-2012   TECHNICALLY DIFFICULT. MILD CONCENTRIC LV HYPERTROPHY. LV CAVITY IS SMALL.. EF=> 55%. TRANSMITRAL SPECTRAL FLOW PATTREN IS SUGGESTIVE OF IMPAIRED LV RELAXATION. RV SYSTOLIC PRESSURE IS 84XLKG. LEFT ATRIAL SIZE IS NORMAL. AV APPEARS MILDLY SCLEROTIC. NO SIGN VALVE DISEASE NOTED.  [name] LYMPHADENECTOMY Bilateral 08/30/2015   Procedure: PELVIC LYMPHADENECTOMY;  Surgeon: [name], MD;  Location: [facility];  Service: Urology;  Laterality: Bilateral;  . NUCLEAR STRESS TEST N/A 01-21-2012   NORMAL PATTERN OF PERFUSION IN ALL REGIONS. POST STRESS LV SIZE IS NORMAL. NO EVIDENCE OF INDUCIBLE ISCHEMIA. EF 54%.  [name] POLYPECTOMY    . PROSTATE BIOPSY    . ROBOT ASSISTED LAPAROSCOPIC RADICAL PROSTATECTOMY N/A 08/30/2015    Procedure: ROBOTIC ASSISTED LAPAROSCOPIC RADICAL PROSTATECTOMY;  Surgeon: [name], MD;  Location: [facility];  Service: Urology;  Laterality: N/A;  . US VENOUS LOWER EXT Right 03/07/11   PERSISTENT DVT IN RIGHT LOWER EXT.WITH PERSISTANT VISUALIZATION OF HYPOECHOIC THROMBUS WITHIN THE FEMORAL, PROFUNDA FEMORAL AND POPLITEAL VEINS. WHEN COMPARED TO PREVIOUS, CLOT IS NO LONGER IDENTIFIED WITH IN THE RIGHT CFV.   Social History   Social History Narrative  . No narrative on file     Objective: Vital Signs: BP (!) 144/76   Pulse 86   Resp 18   Wt 238 lb (108 kg)   BMI 35.15 kg/m    Physical Exam  Constitutional: He is oriented to person, place, and time. He appears well-developed and well-nourished.  HENT:  Head: Normocephalic and atraumatic.  Eyes: Conjunctivae and EOM are normal. Pupils are equal, round, and reactive to light.  Neck:  Normal range of motion. Neck supple.  Cardiovascular: Normal rate, regular rhythm and normal heart sounds.   Pitting pedal edema noted on bilateral lower extremity up to his knee joints.  Pulmonary/Chest: Effort normal and breath sounds normal.  Abdominal: Soft. Bowel sounds are normal.  Neurological: He is alert and oriented to person, place, and time.  Skin: Skin is warm and dry. Capillary refill takes less than 2 seconds.  Psychiatric: He has a normal mood and affect. His behavior is normal.  Nursing note and vitals reviewed.    Musculoskeletal Exam: C-spine and thoracic lumbar spine good range of motion. Shoulder joints elbow joints wrist joints are good range of motion. He had some synovitis in MCP PIP joints it's described below. Hip joints knee joints ankles were good range of motion.  CDAI Exam: CDAI Homunculus Exam:   Tenderness:  Right hand: 1st MCP, 2nd PIP and 3rd PIP  Swelling:  Right hand: 1st MCP, 2nd PIP and 3rd PIP  Joint Counts:  CDAI Tender Joint count: 3 CDAI Swollen Joint count: 3  Global Assessments:  Patient  Global Assessment: 1 Provider Global Assessment: 4  CDAI Calculated Score: 11    Investigation: No additional findings.   Admission on 03/24/2017, Discharged on 03/25/2017  Component Date Value Ref Range Status  . WBC 03/24/2017 14.7* 4.0 - 10.5 K/uL Final  . RBC 03/24/2017 5.13  4.22 - 5.81 MIL/uL Final  . Hemoglobin 03/24/2017 15.8  13.0 - 17.0 g/dL Final  . HCT 03/24/2017 47.1  39.0 - 52.0 % Final  . MCV 03/24/2017 91.8  78.0 - 100.0 fL Final  . MCH 03/24/2017 30.8  26.0 - 34.0 pg Final  . MCHC 03/24/2017 33.5  30.0 - 36.0 g/dL Final  . RDW 03/24/2017 15.3  11.5 - 15.5 % Final  . Platelets 03/24/2017 195  150 - 400 K/uL Final  . Neutrophils Relative % 03/24/2017 93  % Final  . Neutro Abs 03/24/2017 13.6* 1.7 - 7.7 K/uL Final  . Lymphocytes Relative 03/24/2017 3  % Final  . Lymphs Abs 03/24/2017 0.5* 0.7 - 4.0 K/uL Final  . Monocytes Relative 03/24/2017 4  % Final  . Monocytes Absolute 03/24/2017 0.5  0.1 - 1.0 K/uL Final  . Eosinophils Relative 03/24/2017 0  % Final  . Eosinophils Absolute 03/24/2017 0.0  0.0 - 0.7 K/uL Final  . Basophils Relative 03/24/2017 0  % Final  . Basophils Absolute 03/24/2017 0.0  0.0 - 0.1 K/uL Final  . Sodium 03/24/2017 138  135 - 145 mmol/L Final  . Potassium 03/24/2017 4.3  3.5 - 5.1 mmol/L Final  . Chloride 03/24/2017 98* 101 - 111 mmol/L Final  . CO2 03/24/2017 31  22 - 32 mmol/L Final  . Glucose, Bld 03/24/2017 149* 65 - 99 mg/dL Final  . BUN 03/24/2017 17  6 - 20 mg/dL Final  . Creatinine, Ser 03/24/2017 1.14  0.61 - 1.24 mg/dL Final  . Calcium 03/24/2017 8.8* 8.9 - 10.3 mg/dL Final  . Total Protein 03/24/2017 6.7  6.5 - 8.1 g/dL Final  . Albumin 03/24/2017 3.4* 3.5 - 5.0 g/dL Final  . AST 03/24/2017 34  15 - 41 U/L Final  . ALT 03/24/2017 33  17 - 63 U/L Final  . Alkaline Phosphatase 03/24/2017 109  38 - 126 U/L Final  . Total Bilirubin 03/24/2017 0.9  0.3 - 1.2 mg/dL Final  . GFR calc non Af Amer 03/24/2017 >60  >60 mL/min Final  .  GFR calc Af Amer 03/24/2017 >  60  >60 mL/min Final   Comment: (NOTE) The eGFR has been calculated using the CKD EPI equation. This calculation has not been validated in all clinical situations. eGFR's persistently <60 mL/min signify possible Chronic Kidney Disease.   . Anion gap 03/24/2017 9  5 - 15 Final  . Color, Urine 03/24/2017 YELLOW  YELLOW Final  . APPearance 03/24/2017 CLEAR  CLEAR Final  . Specific Gravity, Urine 03/24/2017 1.020  1.005 - 1.030 Final  . pH 03/24/2017 5.5  5.0 - 8.0 Final  . Glucose, UA 03/24/2017 NEGATIVE  NEGATIVE mg/dL Final  . Hgb urine dipstick 03/24/2017 NEGATIVE  NEGATIVE Final  . Bilirubin Urine 03/24/2017 NEGATIVE  NEGATIVE Final  . Ketones, ur 03/24/2017 NEGATIVE  NEGATIVE mg/dL Final  . Protein, ur 03/24/2017 NEGATIVE  NEGATIVE mg/dL Final  . Nitrite 03/24/2017 NEGATIVE  NEGATIVE Final  . Leukocytes, UA 03/24/2017 NEGATIVE  NEGATIVE Final  . Lipase 03/24/2017 26  11 - 51 U/L Final    Imaging: Dg Chest 2 View  Result Date: 03/24/2017 CLINICAL DATA:  Fever, cough, vomiting, diarrhea EXAM: CHEST  2 VIEW COMPARISON:  01/18/2017 FINDINGS: There is elevation of the left diaphragm. There is no focal parenchymal opacity. There is no pleural effusion or pneumothorax. The heart and mediastinal contours are unremarkable. The osseous structures are unremarkable. IMPRESSION: No active cardiopulmonary disease. Electronically Signed   By: Kathreen Devoid   On: 03/24/2017 17:32    Speciality Comments: No specialty comments available.    Procedures:  No procedures performed Allergies: Orencia [abatacept]; Carbamazepine; Celecoxib; Cephalexin; Doxycycline; Enbrel [etanercept]; Humira [adalimumab]; Levofloxacin; Sulfa antibiotics; and Sulfasalazine   Assessment / Plan:     Visit Diagnoses: Rheumatoid arthritis involving multiple sites with positive rheumatoid factor ([place]): He had minimal synovitis on examination today. He isn't quite well on [place] and is tolerating  it well. He is able to taper his prednisone down. We had detailed discussion regarding prednisone taper by 2.5 mg every two week if tolerated. We also discussed adding methotrexate to his regimen. Indications side effects contraindications were discussed. The plan is to start on 0.4 mg up to every week. If tolerated we'll increase that in future.  High risk medication use - Arava 20 mg by mouth daily, prednisone20 mg qd. After starting methotrexate I will check labs in 2 weeks and then every 2 months to monitor for drug toxicity.  Primary osteoarthritis of both knees: Chronic pain weight loss diet and exercise was discussed.  Pedal edema: He continues to have significant pedal edema. He does not appear to have any secondary infection today.  History of CHF (congestive heart failure)  History of COPD  History of prostate cancer  History of seizure disorder  Hx of adenomatous colonic polyps  Personal history of DVT (deep vein thrombosis) - On Coumadin   Patient Active Problem List   Diagnosis Date Noted  . History of seizure disorder 02/27/2017  . Primary osteoarthritis of both hands 02/27/2017  . Primary osteoarthritis of both feet 02/27/2017  . Idiopathic chronic gout of multiple sites without tophus 02/27/2017  . High risk medication use 12/29/2016  . History of prostate cancer 12/29/2016  . Primary osteoarthritis of both knees 12/29/2016  . Chronic idiopathic gout involving toe without tophus 12/29/2016  . History of CHF (congestive heart failure) 12/29/2016  . History of COPD 12/29/2016  . Plantar pustular psoriasis 12/29/2016  . DVT (deep venous thrombosis) ([place]) 10/31/2016  . COPD (chronic obstructive pulmonary disease) ([place]) 10/31/2016  . CHF (congestive  heart failure) ([place]) 10/31/2016  . Prostate cancer ([place]) 08/30/2015  . Malignant neoplasm of prostate ([place]) 07/04/2015  . Chest pain at rest 07/05/2014  . Weakness 07/05/2014  . Complicated postphlebitic syndrome  11/16/2013  . Personal history of DVT (deep vein thrombosis) 05/18/2013  . Chronic anticoagulation 05/18/2013  . Diverticulosis of colon without hemorrhage 05/18/2013  . Rheumatoid arthritis ([place]) 05/18/2013  . Seizure disorder ([place]) 05/18/2013  . Hx of adenomatous colonic polyps 05/18/2013  . GERD 05/08/2010  . NAUSEA 05/08/2010  . FLATULENCE-GAS-BLOATING 05/08/2010   Orders: No orders of the defined types were placed in this encounter.  No orders of the defined types were placed in this encounter.   Face-to-face time spent with patient was 30 minutes. 50% of time was spent in counseling and coordination of care.  Follow-Up Instructions: Return in about 3 months (around 07/02/2017) for Rheumatoid arthritis.   Bo Merino, MD  Note - This record has been created using Editor, commissioning.  Chart creation errors have been sought, but may not always  have been located. Such creation errors do not reflect on  the standard of medical care.

## 2017-03-31 ENCOUNTER — Ambulatory Visit: Payer: Medicare Other | Admitting: Rheumatology

## 2017-04-02 ENCOUNTER — Encounter: Payer: Self-pay | Admitting: Rheumatology

## 2017-04-02 ENCOUNTER — Ambulatory Visit (INDEPENDENT_AMBULATORY_CARE_PROVIDER_SITE_OTHER): Payer: Medicare Other | Admitting: Rheumatology

## 2017-04-02 VITALS — BP 144/76 | HR 86 | Resp 18 | Wt 238.0 lb

## 2017-04-02 DIAGNOSIS — M0579 Rheumatoid arthritis with rheumatoid factor of multiple sites without organ or systems involvement: Secondary | ICD-10-CM | POA: Diagnosis not present

## 2017-04-02 DIAGNOSIS — Z8709 Personal history of other diseases of the respiratory system: Secondary | ICD-10-CM | POA: Diagnosis not present

## 2017-04-02 DIAGNOSIS — Z86718 Personal history of other venous thrombosis and embolism: Secondary | ICD-10-CM | POA: Diagnosis not present

## 2017-04-02 DIAGNOSIS — Z86010 Personal history of colonic polyps: Secondary | ICD-10-CM | POA: Diagnosis not present

## 2017-04-02 DIAGNOSIS — M170 Bilateral primary osteoarthritis of knee: Secondary | ICD-10-CM | POA: Diagnosis not present

## 2017-04-02 DIAGNOSIS — Z8546 Personal history of malignant neoplasm of prostate: Secondary | ICD-10-CM | POA: Diagnosis not present

## 2017-04-02 DIAGNOSIS — Z8669 Personal history of other diseases of the nervous system and sense organs: Secondary | ICD-10-CM | POA: Diagnosis not present

## 2017-04-02 DIAGNOSIS — Z79899 Other long term (current) drug therapy: Secondary | ICD-10-CM | POA: Diagnosis not present

## 2017-04-02 DIAGNOSIS — Z8679 Personal history of other diseases of the circulatory system: Secondary | ICD-10-CM | POA: Diagnosis not present

## 2017-04-02 DIAGNOSIS — R600 Localized edema: Secondary | ICD-10-CM | POA: Diagnosis not present

## 2017-04-02 MED ORDER — PREDNISONE 5 MG PO TABS: 17.5000 mg | ORAL_TABLET | Freq: Every day | ORAL | 0 refills | Status: DC

## 2017-04-02 MED ORDER — "TUBERCULIN-ALLERGY SYRINGES 27G X 1/2"" 1 ML KIT": 1.0000 | PACK | 3 refills | Status: DC

## 2017-04-02 MED ORDER — METHOTREXATE SODIUM CHEMO INJECTION 50 MG/2ML: INTRAMUSCULAR | 0 refills | Status: DC

## 2017-04-02 NOTE — Patient Instructions (Addendum)
Reduce prednisone dose to 17.5 mg (3 and 1/2 tablets) daily for two weeks, then continue to reduce methotrexate dose by 2.5 mg every 2 weeks if tolerated  Standing Labs We placed an order today for your standing lab work.    Please come back and get your standing labs in 2 weeks then every 2 months  We have open lab Monday through Friday from 8:30-11:30 AM and 1:30-4 PM at the office of Dr. Tresa Moore, PA.   The office is located at [address] [place], [place], [place] [address] No appointment is necessary.   Labs are drawn by [facility].  You may receive a bill from [facility] for your lab work.    Methotrexate subcutaneous injection What is this medicine? METHOTREXATE (METH oh TREX ate) is a cytotoxic drug that also suppresses the immune system. It is used to treat psoriasis and rheumatoid arthritis. This medicine may be used for other purposes; ask your health care provider or pharmacist if you have questions. COMMON BRAND NAME(S): Otrexup, Rasuvo What should I tell my health care provider before I take this medicine? They need to know if you have any of these conditions: -fluid in the stomach area or lungs -if you often drink alcohol -infection or immune system problems -kidney disease -liver disease -low blood counts, like low white cell, platelet, or red cell counts -lung disease -radiation therapy -stomach ulcers -ulcerative colitis -an unusual or allergic reaction to methotrexate, other medicines, foods, dyes, or preservatives -pregnant or trying to get pregnant -breast-feeding How should I use this medicine? This medicine is for injection under the skin. You will be taught how to prepare and give this medicine. Refer to the Instructions for Use that come with your medication packaging. Use exactly as directed. Take your medicine at regular intervals. Do not take your medicine more often than directed. This medicine should be taken weekly, NOT daily. It  is important that you put your used needles and syringes in a special sharps container. Do not put them in a trash can. If you do not have a sharps container, call your pharmacist of healthcare provider to get one. Talk to your pediatrician regarding the use of this medicine in children. While this drug may be prescribed for children as young as 2 years for selected conditions, precautions do apply. Overdosage: If you think you have taken too much of this medicine contact a poison control center or emergency room at once. NOTE: This medicine is only for you. Do not share this medicine with others. What if I miss a dose? If you are not sure if this medicine was injected or if you have a hard time giving the injection, do not inject another dose. Talk with your doctor or health care professional. What may interact with this medicine? This medicine may interact with the following medications: -acitretin -aspirin or aspirin-like medicines including salicylates -azathioprine -certain antibiotics like chloramphenicol, penicillin, tetracycline -cyclosporine -gold -hydroxychloroquine -live virus vaccines -mercaptopurine -NSAIDs, medicines for pain and inflammation, like ibuprofen or naproxen -other cytotoxic agents -penicillamine -phenylbutazone -phenytoin -probenacid -retinoids such as isotretinoin and tretinoin -steroid medicines like prednisone or cortisone -sulfonamides like sulfasalazine and trimethoprim/sulfamethoxazole -theophylline This list may not describe all possible interactions. Give your health care provider a list of all the medicines, herbs, non-prescription drugs, or dietary supplements you use. Also tell them if you smoke, drink alcohol, or use illegal drugs. Some items may interact with your medicine. What should I watch for while using this medicine?  Avoid alcoholic drinks. This medicine can make you more sensitive to the sun. Keep out of the sun. If you cannot avoid being  in the sun, wear protective clothing and use sunscreen. Do not use sun lamps or tanning beds/booths. You may get drowsy or dizzy. Do not drive, use machinery, or do anything that needs mental alertness until you know how this medicine affects you. Do not stand or sit up quickly, especially if you are an older patient. This reduces the risk of dizzy or fainting spells. You may need blood work done while you are taking this medicine. Call your doctor or health care professional for advice if you get a fever, chills or sore throat, or other symptoms of a cold or flu. Do not treat yourself. This drug decreases your body's ability to fight infections. Try to avoid being around people who are sick. This medicine may increase your risk to bruise or bleed. Call your doctor or health care professional if you notice any unusual bleeding. Check with your doctor or health care professional if you get an attack of severe diarrhea, nausea and vomiting, or if you sweat a lot. The loss of too much body fluid can make it dangerous for you to take this medicine. Talk to your doctor about your risk of cancer. You may be more at risk for certain types of cancers if you take this medicine. Both men and women must use effective birth control with this medicine. Do not become pregnant while taking this medicine or until at least 1 normal menstrual cycle has occurred after stopping it. Women should inform their doctor if they wish to become pregnant or think they might be pregnant. Men should not father a child while taking this medicine and for 3 months after stopping it. There is a potential for serious side effects to an unborn child. Talk to your health care professional or pharmacist for more information. Do not breast-feed an infant while taking this medicine. What side effects may I notice from receiving this medicine? Side effects that you should report to your doctor or health care professional as soon as  possible: -allergic reactions like skin rash, itching or hives, swelling of the face, lips, or tongue -breathing problems or shortness of breath -diarrhea -dry, nonproductive cough -low blood counts - this medicine may decrease the number of white blood cells, red blood cells and platelets. You may be at increased risk of infections and bleeding -mouth sores -redness, blistering, peeling or loosening of the skin, including inside the mouth -signs of infection - fever or chills, cough, sore throat, pain or difficulty passing urine -signs and symptoms of bleeding such as bloody or black, tarry stools; red or dark-brown urine; spitting up blood or brown material that looks like coffee grounds; red spots on the skin; unusual bruising or bleeding from the eye, gums, or nose -signs and symptoms of kidney injury like trouble passing urine or change in the amount of urine -signs and symptoms of liver injury like dark yellow or brown urine; general ill feeling or flu-like symptoms; light-colored stools; loss of appetite; nausea; right upper belly pain; unusually weak or tired; yellowing of the eyes or skin Side effects that usually do not require medical attention (report to your doctor or health care professional if they continue or are bothersome): -dizziness -hair loss -headache -stomach pain -upset stomach -vomiting This list may not describe all possible side effects. Call your doctor for medical advice about side effects. You may report  side effects to FDA at 1-800-FDA-1088. Where should I keep my medicine? Keep out of the reach of children. You will be instructed on how to store this medicine. Throw away any unused medicine after the expiration date on the label. NOTE: This sheet is a summary. It may not cover all possible information. If you have questions about this medicine, talk to your doctor, pharmacist, or health care provider.  2018 Elsevier/Gold Standard (2016-01-18 11:50:46)

## 2017-04-02 NOTE — Progress Notes (Signed)
Pharmacy Note  Subjective: Patient presents today to the [facility] to see Dr. Estanislado Pandy who presents for follow up of his rheumatoid arthritis.    He is currently taking leflunomide 20 mg daily and prednisone 20 mg daily.  He reduced prednisone dose to 20 mg approximately 3 weeks ago.    Does the patient feel that his/her medications are working for him/her?  Yes - he reports some improvement with leflunomide.  He was able to reduce prednisone dose from 25 mg daily to 20 mg daily.   Has the patient been experiencing any side effects to the medications prescribed?  No Does the patient have any problems obtaining medications?  No  Patient seen by the pharmacist for counseling on injectable methotrexate.    Objective: CBC    Component Value Date/Time   WBC 14.7 (H) 03/24/2017 2046   RBC 5.13 03/24/2017 2046   HGB 15.8 03/24/2017 2046   HCT 47.1 03/24/2017 2046   HCT 43.4 11/25/2016 0824   PLT 195 03/24/2017 2046   PLT 213 11/25/2016 0824   MCV 91.8 03/24/2017 2046   MCV 87 11/25/2016 0824   MCH 30.8 03/24/2017 2046   MCHC 33.5 03/24/2017 2046   RDW 15.3 03/24/2017 2046   RDW 16.0 (H) 11/25/2016 0824   LYMPHSABS 0.5 (L) 03/24/2017 2046   LYMPHSABS 0.9 11/25/2016 0824   MONOABS 0.5 03/24/2017 2046   EOSABS 0.0 03/24/2017 2046   EOSABS 0.4 11/25/2016 0824   BASOSABS 0.0 03/24/2017 2046   BASOSABS 0.1 11/25/2016 0824   CMP     Component Value Date/Time   NA 138 03/24/2017 2046   NA 144 11/25/2016 0824   K 4.3 03/24/2017 2046   CL 98 (L) 03/24/2017 2046   CO2 31 03/24/2017 2046   GLUCOSE 149 (H) 03/24/2017 2046   BUN 17 03/24/2017 2046   BUN 13 11/25/2016 0824   CREATININE 1.14 03/24/2017 2046   CALCIUM 8.8 (L) 03/24/2017 2046   PROT 6.7 03/24/2017 2046   PROT 6.6 11/25/2016 0824   ALBUMIN 3.4 (L) 03/24/2017 2046   ALBUMIN 3.8 11/25/2016 0824   AST 34 03/24/2017 2046   ALT 33 03/24/2017 2046   ALKPHOS 109 03/24/2017 2046   BILITOT 0.9 03/24/2017 2046    BILITOT 0.4 11/25/2016 0824   GFRNONAA >60 03/24/2017 2046   GFRAA >60 03/24/2017 2046   TB: 01/02/17 negative TB skin test Hepatitis panel: negative (06/07/2010)  Chest-xray:  01/18/2017 "IMPRESSION: No active cardiopulmonary disease."   Contraception: Patient's wife is post-menopausal   Assessment/Plan:  Patient was initiated on methotrexate 0.4 mL weekly.  Patient was counseled extensively on the purpose, proper use, and adverse effects of methotrexate on 03/03/17.  Again reviewed the risk adverse effects including pneumonitis.  Counseled patient on signs and symptoms of pneumonitis.  Reviewed instructions with patient to take methotrexate weekly and continues to take folic acid daily (patient is currently taking folic acid 2 mg daily).  Discussed the importance of frequent monitoring of kidney and liver function and blood counts while on methotrexate and leflunomide,  and provided patient with standing lab instructions.  Counseled patient to avoid NSAIDs and alcohol while on methotrexate.  Provided patient with educational materials on methotrexate and answered all questions.  Patient consented to methotrexate use on 03/03/17.  I advised patient that methotrexate may increase the effect of warfarin.  Patient confirms Dr. Hilma Favors monitors his INR.  I advised patient to inform Dr. Delanna Ahmadi office that he is starting methotrexate so they can  monitor INR closely.  Patient voiced understanding.    Educated patient on how to use a vial and syringe and reviewed injection technique with patient.  Patient was able to demonstrate proper technique for injections using vial and syringe.  Provided patient on educational material regarding injection technique and storage of methotrexate.    Patient was given instructions for prednisone taper.  Patient was advised to continue to monitor blood sugar and blood pressure closely while on prednisone.     Elisabeth Most, Pharm.D., BCPS Clinical Pharmacist Pager:  [phone] Phone: [phone] 04/02/2017 10:39 AM

## 2017-04-02 NOTE — Addendum Note (Signed)
Addended by: Cyndia Skeeters on: 04/02/2017 12:02 PM   Modules accepted: Orders

## 2017-05-02 ENCOUNTER — Other Ambulatory Visit: Payer: Self-pay | Admitting: Rheumatology

## 2017-05-02 DIAGNOSIS — E6609 Other obesity due to excess calories: Secondary | ICD-10-CM | POA: Diagnosis not present

## 2017-05-02 DIAGNOSIS — J329 Chronic sinusitis, unspecified: Secondary | ICD-10-CM | POA: Diagnosis not present

## 2017-05-02 DIAGNOSIS — M0689 Other specified rheumatoid arthritis, multiple sites: Secondary | ICD-10-CM | POA: Diagnosis not present

## 2017-05-02 DIAGNOSIS — Z6834 Body mass index (BMI) 34.0-34.9, adult: Secondary | ICD-10-CM | POA: Diagnosis not present

## 2017-05-02 DIAGNOSIS — E669 Obesity, unspecified: Secondary | ICD-10-CM | POA: Diagnosis not present

## 2017-05-02 DIAGNOSIS — M1991 Primary osteoarthritis, unspecified site: Secondary | ICD-10-CM | POA: Diagnosis not present

## 2017-05-05 DIAGNOSIS — H6192 Disorder of left external ear, unspecified: Secondary | ICD-10-CM | POA: Diagnosis not present

## 2017-05-05 DIAGNOSIS — K219 Gastro-esophageal reflux disease without esophagitis: Secondary | ICD-10-CM | POA: Diagnosis not present

## 2017-05-05 DIAGNOSIS — R490 Dysphonia: Secondary | ICD-10-CM | POA: Diagnosis not present

## 2017-05-06 ENCOUNTER — Other Ambulatory Visit (HOSPITAL_COMMUNITY): Payer: Self-pay | Admitting: Internal Medicine

## 2017-05-06 ENCOUNTER — Ambulatory Visit (HOSPITAL_COMMUNITY)
Admission: RE | Admit: 2017-05-06 | Discharge: 2017-05-06 | Disposition: A | Discharge status: Home / Self Care | Payer: Medicare Other | Source: Ambulatory Visit | Attending: Internal Medicine | Admitting: Internal Medicine

## 2017-05-06 DIAGNOSIS — Q791 Other congenital malformations of diaphragm: Secondary | ICD-10-CM | POA: Insufficient documentation

## 2017-05-06 DIAGNOSIS — R059 Cough, unspecified: Secondary | ICD-10-CM

## 2017-05-06 DIAGNOSIS — R05 Cough: Secondary | ICD-10-CM | POA: Insufficient documentation

## 2017-05-06 DIAGNOSIS — J329 Chronic sinusitis, unspecified: Secondary | ICD-10-CM | POA: Diagnosis not present

## 2017-05-06 DIAGNOSIS — J9801 Acute bronchospasm: Secondary | ICD-10-CM | POA: Diagnosis not present

## 2017-05-06 DIAGNOSIS — E669 Obesity, unspecified: Secondary | ICD-10-CM | POA: Diagnosis not present

## 2017-05-06 DIAGNOSIS — Z681 Body mass index (BMI) 19 or less, adult: Secondary | ICD-10-CM | POA: Diagnosis not present

## 2017-05-06 DIAGNOSIS — I700 Atherosclerosis of aorta: Secondary | ICD-10-CM | POA: Insufficient documentation

## 2017-05-06 DIAGNOSIS — Z7901 Long term (current) use of anticoagulants: Secondary | ICD-10-CM | POA: Diagnosis not present

## 2017-05-06 DIAGNOSIS — M1991 Primary osteoarthritis, unspecified site: Secondary | ICD-10-CM | POA: Diagnosis not present

## 2017-05-06 DIAGNOSIS — J449 Chronic obstructive pulmonary disease, unspecified: Secondary | ICD-10-CM | POA: Diagnosis not present

## 2017-05-06 NOTE — Telephone Encounter (Signed)
He should not stop prednisone suddenly. He should resume prednisone. Okay to refill his medication

## 2017-05-06 NOTE — Telephone Encounter (Signed)
Ok to refill Pred

## 2017-05-06 NOTE — Telephone Encounter (Signed)
Spoke with patient and he has tapered down to 15 mg of Prednisone. Patient is not currently taking Prednisone due to a cold. Patient states he also got a call stating that his PSA level was at a 0.1 and he is schedule to follow up with Dr. Alyson Ingles at the end of May. Patient states referral appointment to [facility] has been rescheduled to 06/30/17.  Last Visit: 04/02/17 Next Visit: 06/30/17  Okay to refill Prednisone?

## 2017-05-06 NOTE — Telephone Encounter (Signed)
Patient has not stopped his prednisone. He has stopped his MTX due to his cold. Prednisone was typed in error.

## 2017-05-11 ENCOUNTER — Other Ambulatory Visit: Payer: Self-pay | Admitting: Gastroenterology

## 2017-05-12 ENCOUNTER — Telehealth: Payer: Self-pay | Admitting: Gastroenterology

## 2017-05-12 MED ORDER — DICYCLOMINE HCL 10 MG PO CAPS: 10.0000 mg | ORAL_CAPSULE | Freq: Three times a day (TID) | ORAL | 0 refills | Status: DC

## 2017-05-12 NOTE — Telephone Encounter (Signed)
Called pharmacy and informed them the automatic rx request came across with 40mg  of Bentyl and not the usual dose as 10 mg and to please correct the dose to 10mg . Corine corrected dose for patient.

## 2017-05-21 DIAGNOSIS — C61 Malignant neoplasm of prostate: Secondary | ICD-10-CM | POA: Diagnosis not present

## 2017-05-28 ENCOUNTER — Ambulatory Visit (INDEPENDENT_AMBULATORY_CARE_PROVIDER_SITE_OTHER): Payer: Medicare Other | Admitting: Urology

## 2017-05-28 DIAGNOSIS — N5231 Erectile dysfunction following radical prostatectomy: Secondary | ICD-10-CM

## 2017-05-28 DIAGNOSIS — C61 Malignant neoplasm of prostate: Secondary | ICD-10-CM

## 2017-06-04 ENCOUNTER — Other Ambulatory Visit: Payer: Self-pay | Admitting: Rheumatology

## 2017-06-05 DIAGNOSIS — Z7901 Long term (current) use of anticoagulants: Secondary | ICD-10-CM | POA: Diagnosis not present

## 2017-06-05 NOTE — Telephone Encounter (Signed)
Last Visit: 04/02/17 Next Visit: 06/30/17  Okay to refill Prednisone?

## 2017-06-05 NOTE — Telephone Encounter (Signed)
On April 2018 visit with Dr. Estanislado Pandy, patient was advised to do the following:We had detailed discussion regarding prednisone taper by 2.5 mg every two week if tolerated. Please tell pt.  Also, what dose is he on now?

## 2017-06-07 ENCOUNTER — Other Ambulatory Visit: Payer: Self-pay | Admitting: Gastroenterology

## 2017-06-08 ENCOUNTER — Other Ambulatory Visit: Payer: Self-pay | Admitting: Cardiovascular Disease

## 2017-06-12 ENCOUNTER — Other Ambulatory Visit: Payer: Self-pay | Admitting: Rheumatology

## 2017-06-12 ENCOUNTER — Other Ambulatory Visit: Payer: Self-pay | Admitting: Cardiovascular Disease

## 2017-06-12 ENCOUNTER — Other Ambulatory Visit: Payer: Self-pay | Admitting: Gastroenterology

## 2017-06-12 NOTE — Telephone Encounter (Signed)
Last Visit: 04/02/17 Next Visit: 06/30/17 Labs: 03/24/17 WBC 14.6 previously 12.8 elevated glucose  Okay to refill Arava?

## 2017-06-16 ENCOUNTER — Telehealth: Payer: Self-pay | Admitting: Radiology

## 2017-06-16 NOTE — Telephone Encounter (Signed)
Shoulder neck and  hands are painful / he states he did miss a couple doses of his arthritis meds, and he did recently help his sister paint her carport / with increased pain.   He has been on 15 mg of prednisone, increased to 30 with some relief, please advise   July 2nd is [facility] appt

## 2017-06-16 NOTE — Telephone Encounter (Signed)
I spoke with patient and discussed that high-dose prednisone will increase his risk of having elevated blood glucose, elevated blood pressure, weight gain and osteoporosis. If he responds to the prednisone he should taper it by 5 mg every 2 days and go back to his baseline dose. I also discussed that he should be able to use a biologic medication with the history of prostate cancer. He had a reaction to [place]. His other options could be interleukin-6 inhibitor. He has appointment coming up at [facility] and he will discuss treatment plan with those physicians there. He states he was doing quite well on the current combination therapy except that he missed some doses and had to do extra exertion.

## 2017-06-17 DIAGNOSIS — H2513 Age-related nuclear cataract, bilateral: Secondary | ICD-10-CM | POA: Diagnosis not present

## 2017-06-17 DIAGNOSIS — H5203 Hypermetropia, bilateral: Secondary | ICD-10-CM | POA: Diagnosis not present

## 2017-06-17 DIAGNOSIS — H53021 Refractive amblyopia, right eye: Secondary | ICD-10-CM | POA: Diagnosis not present

## 2017-06-17 DIAGNOSIS — H52223 Regular astigmatism, bilateral: Secondary | ICD-10-CM | POA: Diagnosis not present

## 2017-06-20 DIAGNOSIS — S43422A Sprain of left rotator cuff capsule, initial encounter: Secondary | ICD-10-CM | POA: Diagnosis not present

## 2017-06-20 DIAGNOSIS — M25512 Pain in left shoulder: Secondary | ICD-10-CM | POA: Diagnosis not present

## 2017-06-20 DIAGNOSIS — Z6834 Body mass index (BMI) 34.0-34.9, adult: Secondary | ICD-10-CM | POA: Diagnosis not present

## 2017-06-24 NOTE — Progress Notes (Signed)
Office Visit Note  Patient: [name]             Date of Birth: [birth year]           MRN: [id number]             PCP: [name], MD Referring: [name], MD Visit Date: 06/25/2017 Occupation: @GUAROCC @    Subjective:  Medication Management   History of Present Illness: [name] is a 66 y.o. male with history of rheumatoid arthritis. He developed an  upper respiratory tract infection about a month ago and had to stop his methotrexate and Arava. He had a severe flare and started taking prednisone 30 mg a day. He's been tapering his prednisone and has come down to 25 mg a day. He also increased his methotrexate to 0.8 ML subcutaneously last week. He's been taking Arava 20 mg a day. He continues to have some discomfort in his wrist joints in his hands but not much joint swelling. He has some neck stiffness. The plantar psoriasis has improved. He has not had any gout flare . He states his bumped his right lower extremity against some object and scraped it. He was seen by his PCP who wrapped his lower extremity and started him on doxycycline. Is a still taking the medication. Prior to starting prednisone he had severe flare with increased pain and swelling in all of his joints. He was having difficulty getting dressed and even making a  fist. He forgot to take prednisone and Arava 3 weeks ago for 2 days  and had a flare.    Activities of Daily Living:  Patient reports morning stiffness for 0 minute.   Patient Denies nocturnal pain.  Difficulty dressing/grooming: Denies Difficulty climbing stairs: Reports Difficulty getting out of chair: Reports Difficulty using hands for taps, buttons, cutlery, and/or writing: Reports   Review of Systems  Constitutional: Positive for fatigue. Negative for night sweats and weakness ( ).  HENT: Negative for mouth sores, mouth dryness and nose dryness.   Eyes: Negative for redness and dryness.  Respiratory: Positive for shortness of  breath. Negative for difficulty breathing.        History of COPD  Cardiovascular: Negative for chest pain, palpitations, hypertension, irregular heartbeat and swelling in legs/feet.  Gastrointestinal: Negative for constipation and diarrhea.  Endocrine: Negative for increased urination.  Musculoskeletal: Positive for arthralgias, joint pain, joint swelling and morning stiffness. Negative for myalgias, muscle weakness, muscle tenderness and myalgias.  Skin: Negative for color change, rash, hair loss, nodules/bumps, skin tightness, ulcers and sensitivity to sunlight.  Allergic/Immunologic: Negative for susceptible to infections.  Neurological: Negative for dizziness, fainting, memory loss and night sweats.  Hematological: Negative for swollen glands.  Psychiatric/Behavioral: Negative for depressed mood and sleep disturbance. The patient is not nervous/anxious.     PMFS History:  Patient Active Problem List   Diagnosis Date Noted  . History of seizure disorder 02/27/2017  . Primary osteoarthritis of both hands 02/27/2017  . Primary osteoarthritis of both feet 02/27/2017  . Idiopathic chronic gout of multiple sites without tophus 02/27/2017  . High risk medication use 12/29/2016  . History of prostate cancer 12/29/2016  . Primary osteoarthritis of both knees 12/29/2016  . Chronic idiopathic gout involving toe without tophus 12/29/2016  . History of CHF (congestive heart failure) 12/29/2016  . History of COPD 12/29/2016  . Plantar pustular psoriasis 12/29/2016  . DVT (deep venous thrombosis) ([place]) 10/31/2016  . COPD (chronic obstructive pulmonary disease) ([place])  10/31/2016  . CHF (congestive heart failure) ([place]) 10/31/2016  . Prostate cancer ([place]) 08/30/2015  . Malignant neoplasm of prostate ([place]) 07/04/2015  . Chest pain at rest 07/05/2014  . Weakness 07/05/2014  . Complicated postphlebitic syndrome 11/16/2013  . Personal history of DVT (deep vein thrombosis) 05/18/2013  . Chronic  anticoagulation 05/18/2013  . Diverticulosis of colon without hemorrhage 05/18/2013  . Rheumatoid arthritis ([place]) 05/18/2013  . Seizure disorder ([place]) 05/18/2013  . Hx of adenomatous colonic polyps 05/18/2013  . GERD 05/08/2010  . NAUSEA 05/08/2010  . FLATULENCE-GAS-BLOATING 05/08/2010    Past Medical History:  Diagnosis Date  . Anxiety    hx of   . Arthritis   . Asthma   . Clotting disorder (HCC)    DVT both legs   . COPD (chronic obstructive pulmonary disease) ([place])   . DDD (degenerative disc disease), cervical    with UE's paresthesias  . Diverticulosis   . DVT, lower extremity ([place])    bilat  . 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 GERD (gastroesophageal reflux disease)   . Gout   . Heart murmur   . History of measles   . History of shingles   . Hypercholesterolemia   . IBS (irritable bowel syndrome)   . IBS (irritable bowel syndrome)   . Peripheral edema   . Pneumonia    hx of   . Prostate cancer ([place])   . Seizures ([place])    last seizure 20 years ago   . Shortness of breath dyspnea    exertion   . Sleep apnea    not on cpap  . Tubular adenoma of colon 04/2008    Family History  Problem Relation Age of Onset  . Skin cancer Father   . Stomach cancer Father   . Heart attack Father   . Alzheimer's disease Mother   . Throat cancer Brother   . Stomach cancer Brother   . Lung cancer Brother   . Heart attack Brother   . Heart attack Brother   . Breast cancer Sister   . Heart attack Sister   . Stroke Sister   . Bladder Cancer Sister   . Heart murmur Sister   . Colon cancer Neg Hx   . Colon polyps Neg Hx    Past Surgical History:  Procedure Laterality Date  . CHOLECYSTECTOMY    . COLONOSCOPY    . DOPPLER ECHOCARDIOGRAPHY N/A 01-21-2012   TECHNICALLY DIFFICULT. MILD CONCENTRIC LV HYPERTROPHY. LV CAVITY IS SMALL.. EF=> 55%. TRANSMITRAL SPECTRAL FLOW PATTREN IS SUGGESTIVE OF IMPAIRED LV RELAXATION.  RV SYSTOLIC PRESSURE IS 32GMWN. LEFT ATRIAL SIZE IS NORMAL. AV APPEARS MILDLY SCLEROTIC. NO SIGN VALVE DISEASE NOTED.  [name] LYMPHADENECTOMY Bilateral 08/30/2015   Procedure: PELVIC LYMPHADENECTOMY;  Surgeon: [name], MD;  Location: [facility];  Service: Urology;  Laterality: Bilateral;  . NUCLEAR STRESS TEST N/A 01-21-2012   NORMAL PATTERN OF PERFUSION IN ALL REGIONS. POST STRESS LV SIZE IS NORMAL. NO EVIDENCE OF INDUCIBLE ISCHEMIA. EF 54%.  [name] POLYPECTOMY    . PROSTATE BIOPSY    . ROBOT ASSISTED LAPAROSCOPIC RADICAL PROSTATECTOMY N/A 08/30/2015   Procedure: ROBOTIC ASSISTED LAPAROSCOPIC RADICAL PROSTATECTOMY;  Surgeon: [name], MD;  Location: [facility];  Service: Urology;  Laterality: N/A;  . US VENOUS LOWER EXT Right 03/07/11   PERSISTENT DVT IN RIGHT LOWER EXT.WITH PERSISTANT VISUALIZATION OF HYPOECHOIC THROMBUS WITHIN THE FEMORAL, PROFUNDA FEMORAL  AND POPLITEAL VEINS. WHEN COMPARED TO PREVIOUS, CLOT IS NO LONGER IDENTIFIED WITH IN THE RIGHT CFV.   Social History   Social History Narrative  . No narrative on file     Objective: Vital Signs: BP 138/80   Pulse 82   Resp 16   Ht 5\' 9"  (1.753 m)   Wt 232 lb (105.2 kg)   BMI 34.26 kg/m    Physical Exam  Constitutional: He is oriented to person, place, and time. He appears well-developed and well-nourished.  HENT:  Head: Normocephalic and atraumatic.  Eyes: Conjunctivae and EOM are normal. Pupils are equal, round, and reactive to light.  Neck: Normal range of motion. Neck supple.  Cardiovascular: Normal rate, regular rhythm and normal heart sounds.   Pulmonary/Chest: Effort normal and breath sounds normal.  Abdominal: Soft. Bowel sounds are normal.  Neurological: He is alert and oriented to person, place, and time.  Skin: Skin is warm and dry. Capillary refill takes less than 2 seconds.  Abrasion on the right lower extremity  Psychiatric: He has a normal mood and affect. His behavior is normal.  Nursing note and vitals  reviewed.    Musculoskeletal Exam: Discomfort range of motion of his C-spine. Shoulder joints elbow joints wrist joints MCPs PIPs with good range of motion. He has some tenderness across wrist joint and MCP joints but no synovitis was noted. Hip joints knee joints ankles MTPs PIPs with good range of motion.  CDAI Exam: CDAI Homunculus Exam:   Tenderness:  LUE: wrist Right hand: 2nd MCP, 3rd MCP and 4th MCP Left hand: 2nd MCP, 3rd MCP and 4th MCP  Joint Counts:  CDAI Tender Joint count: 7 CDAI Swollen Joint count: 0  Global Assessments:  Patient Global Assessment: 5 Provider Global Assessment: 5  CDAI Calculated Score: 17    Investigation: No additional findings. CBC Latest Ref Rng & Units 03/24/2017 02/24/2017 01/18/2017  WBC 4.0 - 10.5 K/uL 14.7(H) 12.8(H) 6.8  Hemoglobin 13.0 - 17.0 g/dL 15.8 15.3 16.2  Hematocrit 39.0 - 52.0 % 47.1 45.8 48.7  Platelets 150 - 400 K/uL 195 238 184    CMP Latest Ref Rng & Units 03/24/2017 02/24/2017 01/18/2017  Glucose 65 - 99 mg/dL 149(H) 105(H) 151(H)  BUN 6 - 20 mg/dL 17 15 13   Creatinine 0.61 - 1.24 mg/dL 1.14 0.92 1.14  Sodium 135 - 145 mmol/L 138 138 137  Potassium 3.5 - 5.1 mmol/L 4.3 3.1(L) 2.6(LL)  Chloride 101 - 111 mmol/L 98(L) 99(L) 94(L)  CO2 22 - 32 mmol/L 31 28 30   Calcium 8.9 - 10.3 mg/dL 8.8(L) 8.8(L) 8.7(L)  Total Protein 6.5 - 8.1 g/dL 6.7 6.6 -  Total Bilirubin 0.3 - 1.2 mg/dL 0.9 0.7 -  Alkaline Phos 38 - 126 U/L 109 94 -  AST 15 - 41 U/L 34 33 -  ALT 17 - 63 U/L 33 37 -    Imaging: No results found.  Speciality Comments: No specialty comments available.    Procedures:  No procedures performed Allergies: Orencia [abatacept]; Carbamazepine; Celecoxib; Cephalexin; Doxycycline; Enbrel [etanercept]; Humira [adalimumab]; Levofloxacin; Sulfa antibiotics; and Sulfasalazine   Assessment / Plan:     Visit Diagnoses: Rheumatoid arthritis involving multiple sites with positive rheumatoid factor ([place]) - patient had  recent flare with severe pain and discomfort to the point he could not even get dressed. He states prior to that he had upper respiratory tract infection and he had to missed 2 doses of his DMARD's. He is to increase his prednisone  to 35 mg himself and now has tapered down to 25 mg himself. He also increased his methotrexate from 0.4 mL to 0 .6 and now on 0.8 ML subcutaneous every week. I would like to taper his prednisone and having a difficult time with that. We had detailed discussion regarding monitoring his medications and following the directions. Prednisone taper was discussed. I believe he will do better on a biologic agent. Indications side effects contraindications were discussed. We will try to apply for Actemra IV as discovered by his insurance plan better. We will apply for Actemra IV. Within the start him on IV if MRI will discontinue his methotrexate. Patient also has an appointment at [facility] in September and would like their opinion.  High risk medication use - Methotrexate 0.8 mL subcutaneous every week, folic acid 2 mg by mouth daily, Arava 20 mg by mouth daily, prednisone25mg qd(Orencia IV-anaphylaxis) - Plan: CBC with Differential/Platelet, COMPLETE METABOLIC PANEL WITH GFR. We will check labs today. Increased methotrexate himself and is currently taking doxycycline as well. I'm concerned about his LFTs.  Plantar pustular psoriasis: Doing better  Right lower extremity abrasion. He has some his lower extremity wrapped and is taking doxycycline currently.  Idiopathic chronic gout of multiple sites without tophus - Allopurinol 200 mg po qd. He has not had a gout flare.  Primary osteoarthritis of both hands: He is some stiffness in his hands  Primary osteoarthritis of both knees: Chronic pain  Primary osteoarthritis of both feet: Some discomfort  Personal history of DVT (deep vein thrombosis)  History of seizure disorder  History of prostate cancer  History of  COPD  History of CHF (congestive heart failure)  History of diverticulosis    Orders: Orders Placed This Encounter  Procedures  . CBC with Differential/Platelet  . COMPLETE METABOLIC PANEL WITH GFR   No orders of the defined types were placed in this encounter.   Face-to-face time spent with patient was 30 minutes. 50% of time was spent in counseling and coordination of care.  Follow-Up Instructions: Return in about 3 months (around 09/25/2017) for Rheumatoid arthritis.   Bo Merino, MD  Note - This record has been created using Editor, commissioning.  Chart creation errors have been sought, but may not always  have been located. Such creation errors do not reflect on  the standard of medical care.

## 2017-06-25 ENCOUNTER — Encounter (INDEPENDENT_AMBULATORY_CARE_PROVIDER_SITE_OTHER): Payer: Self-pay

## 2017-06-25 ENCOUNTER — Telehealth: Payer: Self-pay

## 2017-06-25 ENCOUNTER — Ambulatory Visit (INDEPENDENT_AMBULATORY_CARE_PROVIDER_SITE_OTHER): Payer: Medicare Other | Admitting: Rheumatology

## 2017-06-25 ENCOUNTER — Encounter: Payer: Self-pay | Admitting: Rheumatology

## 2017-06-25 VITALS — BP 138/80 | HR 82 | Resp 16 | Ht 69.0 in | Wt 232.0 lb

## 2017-06-25 DIAGNOSIS — Z8709 Personal history of other diseases of the respiratory system: Secondary | ICD-10-CM

## 2017-06-25 DIAGNOSIS — M0579 Rheumatoid arthritis with rheumatoid factor of multiple sites without organ or systems involvement: Secondary | ICD-10-CM

## 2017-06-25 DIAGNOSIS — Z8669 Personal history of other diseases of the nervous system and sense organs: Secondary | ICD-10-CM | POA: Diagnosis not present

## 2017-06-25 DIAGNOSIS — Z8546 Personal history of malignant neoplasm of prostate: Secondary | ICD-10-CM

## 2017-06-25 DIAGNOSIS — M19072 Primary osteoarthritis, left ankle and foot: Secondary | ICD-10-CM

## 2017-06-25 DIAGNOSIS — Z8719 Personal history of other diseases of the digestive system: Secondary | ICD-10-CM

## 2017-06-25 DIAGNOSIS — M19042 Primary osteoarthritis, left hand: Secondary | ICD-10-CM

## 2017-06-25 DIAGNOSIS — L408 Other psoriasis: Secondary | ICD-10-CM

## 2017-06-25 DIAGNOSIS — M19071 Primary osteoarthritis, right ankle and foot: Secondary | ICD-10-CM

## 2017-06-25 DIAGNOSIS — Z86718 Personal history of other venous thrombosis and embolism: Secondary | ICD-10-CM | POA: Diagnosis not present

## 2017-06-25 DIAGNOSIS — Z79899 Other long term (current) drug therapy: Secondary | ICD-10-CM

## 2017-06-25 DIAGNOSIS — L403 Pustulosis palmaris et plantaris: Secondary | ICD-10-CM

## 2017-06-25 DIAGNOSIS — M170 Bilateral primary osteoarthritis of knee: Secondary | ICD-10-CM

## 2017-06-25 DIAGNOSIS — M19041 Primary osteoarthritis, right hand: Secondary | ICD-10-CM

## 2017-06-25 DIAGNOSIS — M1A09X0 Idiopathic chronic gout, multiple sites, without tophus (tophi): Secondary | ICD-10-CM | POA: Diagnosis not present

## 2017-06-25 DIAGNOSIS — Z8679 Personal history of other diseases of the circulatory system: Secondary | ICD-10-CM | POA: Diagnosis not present

## 2017-06-25 LAB — CBC WITH DIFFERENTIAL/PLATELET
Basophils Absolute: 124 cells/uL (ref 0–200)
Basophils Relative: 1 %
Eosinophils Absolute: 372 cells/uL (ref 15–500)
Eosinophils Relative: 3 %
HCT: 45.6 % (ref 38.5–50.0)
Hemoglobin: 15.5 g/dL (ref 13.2–17.1)
Lymphocytes Relative: 15 %
Lymphs Abs: 1860 cells/uL (ref 850–3900)
MCH: 30.1 pg (ref 27.0–33.0)
MCHC: 34 g/dL (ref 32.0–36.0)
MCV: 88.5 fL (ref 80.0–100.0)
MPV: 9.3 fL (ref 7.5–12.5)
Monocytes Absolute: 372 cells/uL (ref 200–950)
Monocytes Relative: 3 %
Neutro Abs: 9672 cells/uL — ABNORMAL HIGH (ref 1500–7800)
Neutrophils Relative %: 78 %
Platelets: 236 10*3/uL (ref 140–400)
RBC: 5.15 MIL/uL (ref 4.20–5.80)
RDW: 15 % (ref 11.0–15.0)
WBC: 12.4 10*3/uL — ABNORMAL HIGH (ref 3.8–10.8)

## 2017-06-25 NOTE — Telephone Encounter (Signed)
Submitted a benefits investigation for Actemra through [employer]. Will update once we receive a response.   LYHTM:[phone] Fax: [phone]  Demetrios Loll, CPhT 3:36 PM

## 2017-06-25 NOTE — Patient Instructions (Addendum)
Please come get a lipid panel in the morning when you are able.    Tocilizumab injection What is this medicine? TOCILIZUMAB (TOE si LIZ ue mab) is a monoclonal antibody. It is used to treat rheumatoid arthritis, juvenile idiopathic arthritis, giant cell arteritis, and cytokine release syndrome due to chimeric antigen receptor (CAR) T-cell treatment. This medicine may be used for other purposes; ask your health care provider or pharmacist if you have questions. COMMON BRAND NAME(S): Actemra What should I tell my health care provider before I take this medicine? They need to know if you have any of these conditions: -cancer -diabetes -diverticulitis -heart disease -hepatitis B or history of hepatitis B infection -high blood pressure -high cholesterol -history of pancreatitis -immune system problems -infection (especially a virus infection such as chickenpox, cold sores, or herpes) -liver disease -low blood counts, like low white cell, platelet, or red cell counts -multiple sclerosis -recently received or scheduled to receive a vaccine -scheduled to have surgery -stomach ulcer -stroke -tuberculosis, a positive skin test for tuberculosis or have recently been in close contact with someone who has tuberculosis -an unusual or allergic reaction to tocilizumab, other medicines, foods, dyes, or preservatives -pregnant or trying to get pregnant -breast-feeding How should I use this medicine? This medicine is for infusion into a vein or for injection under the skin. It is usually given by a health care professional in a hospital or clinic setting. If you get this medicine at home, you will be taught how to prepare and give this medicine. Use exactly as directed. Take your medicine at regular intervals. Do not take your medicine more often than directed. It is important that you put your used needles and syringes in a special sharps container. Do not put them in a trash can. If you do not have a  sharps container, call your pharmacist or healthcare provider to get one. A special MedGuide will be given to you by the pharmacist with each prescription and refill. Be sure to read this information carefully each time. Talk to your pediatrician regarding the use of this medicine in children. While the drug may be prescribed for children as young as 2 years for selected conditions, precautions do apply. Overdosage: If you think you have taken too much of this medicine contact a poison control center or emergency room at once. NOTE: This medicine is only for you. Do not share this medicine with others. What if I miss a dose? It is important not to miss your dose. Call your doctor or health care professional if you are unable to keep an appointment. If you give yourself the medicine and you miss a dose, take it as soon as you can. If it is almost time for your next dose, take only that dose. Do not take double or extra doses. What may interact with this medicine? Do not take this medicine with any of the following medications: -live virus vaccines This medicine may also interact with the following medications: -abatacept -adalimumab -anakinra -atorvastatin -certolizumab -cyclosporine -dextromethorphan -etanercept -golimumab -infliximab -lovastatin -medicines that lower the immune system -ofatumumab -omeprazole -oral contraceptives -rituximab -simvastatin -steroid medicines like prednisone or cortisone -theophylline -tositumomab -vaccines -warfarin This list may not describe all possible interactions. Give your health care provider a list of all the medicines, herbs, non-prescription drugs, or dietary supplements you use. Also tell them if you smoke, drink alcohol, or use illegal drugs. Some items may interact with your medicine. What should I watch for while using this medicine?  Tell your doctor or healthcare professional if your symptoms do not start to get better or if they get  worse. Your condition will be monitored carefully while you are receiving this medicine. You will be tested for tuberculosis (TB) before you start this medicine. If your doctor prescribes any medicine for TB, you should start taking the TB medicine before starting this medicine. Make sure to finish the full course of TB medicine. Talk to your doctor about your risk of cancer. You may be more at risk for certain types of cancers if you take this medicine. Call your doctor or health care professional for advice if you get a fever, chills or sore throat, or other symptoms of a cold or flu. Do not treat yourself. This drug decreases your body's ability to fight infections. Try to avoid being around people who are sick. You may need blood work done while you are taking this medicine. What side effects may I notice from receiving this medicine? Side effects that you should report to your doctor or health care professional as soon as possible: -allergic reactions like skin rash, itching or hives, swelling of the face, lips, or tongue -breathing problems -feeling faint or lightheaded, falls -fever or chills, sore throat -stomach pain -unusual bleeding or bruising -yellowing of the eyes or skin Side effects that usually do not require medical attention (report to your doctor or health care professional if they continue or are bothersome): -dizziness -headache -pain, redness, or irritation at site where injected This list may not describe all possible side effects. Call your doctor for medical advice about side effects. You may report side effects to FDA at 1-800-FDA-1088. Where should I keep my medicine? Keep out of the reach of children. If you are using this medicine at home, you will be instructed on how to store this medicine. Throw away any unused medicine after the expiration date on the label. NOTE: This sheet is a summary. It may not cover all possible information. If you have questions about  this medicine, talk to your doctor, pharmacist, or health care provider.  2018 Elsevier/Gold Standard (2016-09-03 12:55:30)

## 2017-06-25 NOTE — Progress Notes (Signed)
WBC high due to Prednisone use.

## 2017-06-25 NOTE — Progress Notes (Signed)
Pharmacy Note Subjective: Patient presents today to the [facility] to see Dr. Estanislado Pandy.  Patient is currently taking leflunomide 20 mg by mouth daily.  Patient reports he has been on the methotrexate for three weeks.  Prescription was written for methotrexate 0.4 mL weekly.  Patient reports he started methotrexate dose of 0.6 mL and took that dose for two weeks.  Last week, he increased dose to 0.8 mL.  Patient confirms he is taking folic acid 2 mg by mouth daily.    Decision was made to start patient on IL-6 inhibitor.  Patient's medicare insurance has better coverage of infusion medications.  Will apply for Actemra infusions.  Patient seen by the pharmacist for counseling on Actemra.    Objective: TB Test: 01/02/17 PPD negative Hepatitis panel: negative (06/07/2010) Lipid panel: ordered today  CBC    Component Value Date/Time   WBC 14.7 (H) 03/24/2017 2046   RBC 5.13 03/24/2017 2046   HGB 15.8 03/24/2017 2046   HGB 14.7 11/25/2016 0824   HCT 47.1 03/24/2017 2046   HCT 43.4 11/25/2016 0824   PLT 195 03/24/2017 2046   PLT 213 11/25/2016 0824   MCV 91.8 03/24/2017 2046   MCV 87 11/25/2016 0824   MCH 30.8 03/24/2017 2046   MCHC 33.5 03/24/2017 2046   RDW 15.3 03/24/2017 2046   RDW 16.0 (H) 11/25/2016 0824   LYMPHSABS 0.5 (L) 03/24/2017 2046   LYMPHSABS 0.9 11/25/2016 0824   MONOABS 0.5 03/24/2017 2046   EOSABS 0.0 03/24/2017 2046   EOSABS 0.4 11/25/2016 0824   BASOSABS 0.0 03/24/2017 2046   BASOSABS 0.1 11/25/2016 0824    Assessment/Plan:  Counseled patient that Actemra is an IL-6 blocking agent.  Reviewed Actemra dose of 4 mg/kg every 4 weeks.  Counseled patient on purpose, proper use, and adverse effects of Actemra.  Reviewed the most common adverse effects including infections, infusion reaction, bowel injury, and rarely cancer and conditions of the nervous system.  Reviewed that the medication should be held during infections.  Discussed that there is the  possibility of an increased risk of malignancy but it is not well understood if this increased risk is due to the medication or the disease state.  Reviewed the importance of regular labs while on Actemra therapy including the need for routine lipid panel.  Counseled patient that Actemra should be held prior to scheduled surgery.  Counseled patient to avoid live vaccines while on Actemra.  Advised patient to get annual influenza vaccine and the pneumococcal vaccine.  Provided patient with medication education material and answered all questions.  Patient voiced understanding.  Patient consented to Actemra.  Will upload consent into the media tab.  Will submit benefits investigation for Actemra through patient's insurance.    Patient has not had labs since starting methotrexate.  Labs were drawn today.  Counseled patient on importance of labs while on leflunomide and methotrexate.  I reviewed syringes with patient.  He thinks he was provided with 3 mL syringes for his methotrexate injections.  I reviewed that he should be using 1 mL 27G 1/2 inch tuberculin syringes for methotrexate administration.  Provided patient with four tuberculin syringes.  Elisabeth Most, Pharm.D., BCPS, CPP Clinical Pharmacist Pager: [phone] Phone: [phone] 06/25/2017 10:17 AM

## 2017-06-26 LAB — COMPLETE METABOLIC PANEL WITH GFR
ALT: 38 U/L (ref 9–46)
AST: 33 U/L (ref 10–35)
Albumin: 3.9 g/dL (ref 3.6–5.1)
Alkaline Phosphatase: 114 U/L (ref 40–115)
BUN: 16 mg/dL (ref 7–25)
CO2: 29 mmol/L (ref 20–31)
Calcium: 9.3 mg/dL (ref 8.6–10.3)
Chloride: 97 mmol/L — ABNORMAL LOW (ref 98–110)
Creat: 1 mg/dL (ref 0.70–1.25)
GFR, Est African American: >89 mL/min (ref >=60)
GFR, Est Non African American: 78 mL/min (ref >=60)
Glucose, Bld: 96 mg/dL (ref 65–99)
Potassium: 3.7 mmol/L (ref 3.5–5.3)
Sodium: 141 mmol/L (ref 135–146)
Total Bilirubin: 0.6 mg/dL (ref 0.2–1.2)
Total Protein: 6.8 g/dL (ref 6.1–8.1)

## 2017-06-26 NOTE — Progress Notes (Signed)
Labs are normal . Repeat in 2 weks then every 2 months

## 2017-06-30 ENCOUNTER — Ambulatory Visit: Payer: Medicare Other | Admitting: Rheumatology

## 2017-07-04 NOTE — Telephone Encounter (Signed)
Received a fax from [employer] stating the benefits for Actemra with patients insurance.   Patients primary insurance Medicare Part B- no prior authorization required  Secondary insurance [employer]: [employer] Plan F -This plan follows medicare guidelines. This plan covers both the part b deductible and 20% co-insurance leftover from Medicare.  Will send documents to scan center  Demetrios Loll, CPhT 3:44 PM

## 2017-07-05 ENCOUNTER — Emergency Department (HOSPITAL_COMMUNITY)
Admission: EM | Admit: 2017-07-05 | Discharge: 2017-07-05 | Disposition: A | Discharge status: Home / Self Care | Payer: Medicare Other | Attending: Emergency Medicine | Admitting: Emergency Medicine

## 2017-07-05 ENCOUNTER — Encounter (HOSPITAL_COMMUNITY): Payer: Self-pay | Admitting: *Deleted

## 2017-07-05 ENCOUNTER — Emergency Department (HOSPITAL_COMMUNITY): Payer: Medicare Other

## 2017-07-05 DIAGNOSIS — J441 Chronic obstructive pulmonary disease with (acute) exacerbation: Secondary | ICD-10-CM | POA: Insufficient documentation

## 2017-07-05 DIAGNOSIS — Z87891 Personal history of nicotine dependence: Secondary | ICD-10-CM | POA: Insufficient documentation

## 2017-07-05 DIAGNOSIS — Z86718 Personal history of other venous thrombosis and embolism: Secondary | ICD-10-CM | POA: Insufficient documentation

## 2017-07-05 DIAGNOSIS — Z8546 Personal history of malignant neoplasm of prostate: Secondary | ICD-10-CM | POA: Insufficient documentation

## 2017-07-05 DIAGNOSIS — Z7901 Long term (current) use of anticoagulants: Secondary | ICD-10-CM | POA: Insufficient documentation

## 2017-07-05 DIAGNOSIS — Z79899 Other long term (current) drug therapy: Secondary | ICD-10-CM | POA: Insufficient documentation

## 2017-07-05 DIAGNOSIS — I509 Heart failure, unspecified: Secondary | ICD-10-CM | POA: Diagnosis not present

## 2017-07-05 DIAGNOSIS — J45909 Unspecified asthma, uncomplicated: Secondary | ICD-10-CM | POA: Diagnosis not present

## 2017-07-05 DIAGNOSIS — R011 Cardiac murmur, unspecified: Secondary | ICD-10-CM | POA: Insufficient documentation

## 2017-07-05 DIAGNOSIS — R0602 Shortness of breath: Secondary | ICD-10-CM | POA: Diagnosis not present

## 2017-07-05 LAB — COMPREHENSIVE METABOLIC PANEL
ALT: 34 U/L (ref 17–63)
AST: 32 U/L (ref 15–41)
Albumin: 3.3 g/dL — ABNORMAL LOW (ref 3.5–5.0)
Alkaline Phosphatase: 108 U/L (ref 38–126)
Anion gap: 11 (ref 5–15)
BUN: 16 mg/dL (ref 6–20)
CO2: 32 mmol/L (ref 22–32)
Calcium: 8.8 mg/dL — ABNORMAL LOW (ref 8.9–10.3)
Chloride: 100 mmol/L — ABNORMAL LOW (ref 101–111)
Creatinine, Ser: 1.27 mg/dL — ABNORMAL HIGH (ref 0.61–1.24)
GFR calc Af Amer: >60 mL/min (ref >60)
GFR calc non Af Amer: 57 mL/min — ABNORMAL LOW (ref >60)
Glucose, Bld: 120 mg/dL — ABNORMAL HIGH (ref 65–99)
Potassium: 3.4 mmol/L — ABNORMAL LOW (ref 3.5–5.1)
Sodium: 143 mmol/L (ref 135–145)
Total Bilirubin: 0.6 mg/dL (ref 0.3–1.2)
Total Protein: 6.3 g/dL — ABNORMAL LOW (ref 6.5–8.1)

## 2017-07-05 LAB — CBC WITH DIFFERENTIAL/PLATELET
Basophils Absolute: 0 10*3/uL (ref 0.0–0.1)
Basophils Relative: 0 %
Eosinophils Absolute: 0.1 10*3/uL (ref 0.0–0.7)
Eosinophils Relative: 1 %
HCT: 43.4 % (ref 39.0–52.0)
Hemoglobin: 14.6 g/dL (ref 13.0–17.0)
Lymphocytes Relative: 8 %
Lymphs Abs: 0.8 10*3/uL (ref 0.7–4.0)
MCH: 30.1 pg (ref 26.0–34.0)
MCHC: 33.6 g/dL (ref 30.0–36.0)
MCV: 89.5 fL (ref 78.0–100.0)
Monocytes Absolute: 0.9 10*3/uL (ref 0.1–1.0)
Monocytes Relative: 9 %
Neutro Abs: 8.6 10*3/uL — ABNORMAL HIGH (ref 1.7–7.7)
Neutrophils Relative %: 82 %
Platelets: 197 10*3/uL (ref 150–400)
RBC: 4.85 MIL/uL (ref 4.22–5.81)
RDW: 15.2 % (ref 11.5–15.5)
WBC: 10.5 10*3/uL (ref 4.0–10.5)

## 2017-07-05 LAB — TROPONIN I: Troponin I: <0.03 ng/mL (ref <0.03)

## 2017-07-05 LAB — PROTIME-INR
INR: 3.91
Prothrombin Time: 39.3 seconds — ABNORMAL HIGH (ref 11.4–15.2)

## 2017-07-05 LAB — BRAIN NATRIURETIC PEPTIDE: B Natriuretic Peptide: 32 pg/mL (ref 0.0–100.0)

## 2017-07-05 MED ORDER — ALBUTEROL SULFATE (2.5 MG/3ML) 0.083% IN NEBU: 2.5000 mg | INHALATION_SOLUTION | Freq: Four times a day (QID) | RESPIRATORY_TRACT | 12 refills | Status: DC | PRN

## 2017-07-05 MED ORDER — ALBUTEROL SULFATE (2.5 MG/3ML) 0.083% IN NEBU
5.0000 mg | INHALATION_SOLUTION | Freq: Once | RESPIRATORY_TRACT | Status: AC
  Administered 2017-07-05: 5 mg via RESPIRATORY_TRACT
  Filled 2017-07-05: qty 6

## 2017-07-05 MED ORDER — PREDNISONE 20 MG PO TABS: 40.0000 mg | ORAL_TABLET | Freq: Every day | ORAL | 0 refills | Status: DC

## 2017-07-05 NOTE — ED Notes (Signed)
Pt ambulated well, steady and face paced. O2 between 94-96.

## 2017-07-05 NOTE — ED Provider Notes (Signed)
[facility] DEPT Provider Note   CSN: [id number] Arrival date & time: 07/05/17  1616     History   Chief Complaint Chief Complaint  Patient presents with  . Shortness of Breath    HPI [name] is a 66 y.o. male.  HPI  The patient is a 66 year old male with a known history of COPD, congestive heart failure according to his wife, history of bilateral DVTs and is currently on Coumadin and a history of prostate cancer and rheumatoid arthritis. Currently the patient is on prednisone which his wife states he has been on for 6 months. He is also taking oral albuterol and was recently increased from 2 mg to 4 mg approximately last week when he had an upper respiratory infection preceding this increasing shortness of breath. He reports that the shortness of breath is more constant, it is worse with any ambulation, it is worse with laying down, he is not having any significant increased swelling and states that though he does cough a little bit it is not productive of phlegm. He also has no fevers. His legs are persistently severely swollen and this has not changed. He takes 80 mg of Lasix at night and 3 mg of Coumadin during the day. He did take 2 albuterol metered-dose inhaler treatments today which she feels did not really do much for him. He does report having a tightness across his chest.  Past Medical History:  Diagnosis Date  . Anxiety    hx of   . Arthritis   . Asthma   . Clotting disorder (HCC)    DVT both legs   . COPD (chronic obstructive pulmonary disease) ([place])   . DDD (degenerative disc disease), cervical    with UE's paresthesias  . Diverticulosis   . DVT, lower extremity ([place])    bilat  . 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 GERD (gastroesophageal reflux disease)   . Gout   . Heart murmur   . History of measles   . History of shingles   . Hypercholesterolemia   . IBS (irritable bowel syndrome)     . IBS (irritable bowel syndrome)   . Peripheral edema   . Pneumonia    hx of   . Prostate cancer ([place])   . Seizures ([place])    last seizure 20 years ago   . Shortness of breath dyspnea    exertion   . Sleep apnea    not on cpap  . Tubular adenoma of colon 04/2008    Patient Active Problem List   Diagnosis Date Noted  . History of seizure disorder 02/27/2017  . Primary osteoarthritis of both hands 02/27/2017  . Primary osteoarthritis of both feet 02/27/2017  . Idiopathic chronic gout of multiple sites without tophus 02/27/2017  . High risk medication use 12/29/2016  . History of prostate cancer 12/29/2016  . Primary osteoarthritis of both knees 12/29/2016  . Chronic idiopathic gout involving toe without tophus 12/29/2016  . History of CHF (congestive heart failure) 12/29/2016  . History of COPD 12/29/2016  . Plantar pustular psoriasis 12/29/2016  . DVT (deep venous thrombosis) ([place]) 10/31/2016  . COPD (chronic obstructive pulmonary disease) ([place]) 10/31/2016  . CHF (congestive heart failure) ([place]) 10/31/2016  . Prostate cancer ([place]) 08/30/2015  . Malignant neoplasm of prostate ([place]) 07/04/2015  . Chest pain at rest 07/05/2014  . Weakness 07/05/2014  . Complicated postphlebitic syndrome 11/16/2013  .  Personal history of DVT (deep vein thrombosis) 05/18/2013  . Chronic anticoagulation 05/18/2013  . Diverticulosis of colon without hemorrhage 05/18/2013  . Rheumatoid arthritis ([place]) 05/18/2013  . Seizure disorder ([place]) 05/18/2013  . Hx of adenomatous colonic polyps 05/18/2013  . GERD 05/08/2010  . NAUSEA 05/08/2010  . FLATULENCE-GAS-BLOATING 05/08/2010    Past Surgical History:  Procedure Laterality Date  . CHOLECYSTECTOMY    . COLONOSCOPY    . DOPPLER ECHOCARDIOGRAPHY N/A 01-21-2012   TECHNICALLY DIFFICULT. MILD CONCENTRIC LV HYPERTROPHY. LV CAVITY IS SMALL.. EF=> 55%. TRANSMITRAL SPECTRAL FLOW PATTREN IS SUGGESTIVE OF IMPAIRED LV RELAXATION. RV SYSTOLIC PRESSURE IS  86WYBR. LEFT ATRIAL SIZE IS NORMAL. AV APPEARS MILDLY SCLEROTIC. NO SIGN VALVE DISEASE NOTED.  [name] LYMPHADENECTOMY Bilateral 08/30/2015   Procedure: PELVIC LYMPHADENECTOMY;  Surgeon: [name], MD;  Location: [facility];  Service: Urology;  Laterality: Bilateral;  . NUCLEAR STRESS TEST N/A 01-21-2012   NORMAL PATTERN OF PERFUSION IN ALL REGIONS. POST STRESS LV SIZE IS NORMAL. NO EVIDENCE OF INDUCIBLE ISCHEMIA. EF 54%.  [name] POLYPECTOMY    . PROSTATE BIOPSY    . ROBOT ASSISTED LAPAROSCOPIC RADICAL PROSTATECTOMY N/A 08/30/2015   Procedure: ROBOTIC ASSISTED LAPAROSCOPIC RADICAL PROSTATECTOMY;  Surgeon: [name], MD;  Location: [facility];  Service: Urology;  Laterality: N/A;  . US VENOUS LOWER EXT Right 03/07/11   PERSISTENT DVT IN RIGHT LOWER EXT.WITH PERSISTANT VISUALIZATION OF HYPOECHOIC THROMBUS WITHIN THE FEMORAL, PROFUNDA FEMORAL AND POPLITEAL VEINS. WHEN COMPARED TO PREVIOUS, CLOT IS NO LONGER IDENTIFIED WITH IN THE RIGHT CFV.       Home Medications    Prior to Admission medications   Medication Sig Start Date End Date Taking? Authorizing Provider  albuterol (PROVENTIL) (2.5 MG/3ML) 0.083% nebulizer solution Take 3 mLs (2.5 mg total) by nebulization every 6 (six) hours as needed for wheezing or shortness of breath. 07/05/17   Noemi Chapel, MD  albuterol (PROVENTIL) 2 MG tablet Take 2 mg by mouth 2 (two) times daily.    [provider]  allopurinol (ZYLOPRIM) 100 MG tablet TAKE 1 TABLET BY MOUTH TWICE A DAY 01/06/17   Bo Merino, MD  dicyclomine (BENTYL) 10 MG capsule TAKE 1 CAPSULE BY MOUTH 4 TIMES DAILY BEFORE MEALS AND AT BEDTIME 06/09/17   Ladene Artist, MD  folic acid (FOLVITE) 1 MG tablet Take 2 mg by mouth every morning. 09/16/16   [provider]  furosemide (LASIX) 40 MG tablet TAKE 1 TABLET BY MOUTH TWO TIMES DAILY 06/09/17   Croitoru, Mihai, MD  leflunomide (ARAVA) 20 MG tablet TAKE 1 TABLET (20 MG TOTAL) BY MOUTH DAILY. 06/12/17   Panwala, Naitik, PA-C   methotrexate 50 MG/2ML injection Inject 0.4 mL subcutaneous weekly 04/02/17   Bo Merino, MD  mirabegron ER (MYRBETRIQ) 25 MG TB24 tablet Take 25 mg by mouth daily.    [provider]  omeprazole (PRILOSEC) 40 MG capsule Take 40 mg by mouth every evening.  09/23/16   [provider]  ondansetron (ZOFRAN) 4 MG tablet Take 1 tablet (4 mg total) by mouth every 6 (six) hours. 03/24/17   Nat Christen, MD  Oxymetazoline HCl (NASAL SPRAY NA) Place 2 sprays into the nose daily as needed (allergies).    [provider]  phenytoin (DILANTIN) 100 MG ER capsule Take 100-200 mg by mouth 2 (two) times daily. Take one capsule in the morning and 2 capsules at bedtime    [provider]  potassium chloride SA (K-DUR,KLOR-CON) 20 MEQ tablet Take 2 tablets (  40 mEq total) by mouth daily. Patient not taking: Reported on 03/24/2017 01/18/17 01/25/17  Fatima Blank, MD  predniSONE (DELTASONE) 20 MG tablet Take 2 tablets (40 mg total) by mouth daily. 07/05/17   Noemi Chapel, MD  predniSONE (DELTASONE) 5 MG tablet TAKE 15 MG DAILY AND TAPER AS DIRECTED. Patient taking differently: taking 25 mg 06/05/17   Panwala, Naitik, PA-C  PROAIR HFA 108 (90 Base) MCG/ACT inhaler Inhale 1-2 puffs into the lungs every 6 (six) hours as needed.  03/17/17   [provider]  SYMBICORT 160-4.5 MCG/ACT inhaler Inhale 2 puffs into the lungs daily as needed.  10/07/16   [provider]  Tuberculin-Allergy Syringes 27G X 1/2" 1 ML KIT Inject 1 Syringe into the skin once a week. To be used with weekly methotrexate injections. 04/02/17   Bo Merino, MD  warfarin (COUMADIN) 3 MG tablet Take 3 mg by mouth at bedtime.     [provider]    Family History Family History  Problem Relation Age of Onset  . Skin cancer Father   . Stomach cancer Father   . Heart attack Father   . Alzheimer's disease Mother   . Throat cancer Brother   . Stomach cancer Brother   . Lung cancer  Brother   . Heart attack Brother   . Heart attack Brother   . Breast cancer Sister   . Heart attack Sister   . Stroke Sister   . Bladder Cancer Sister   . Heart murmur Sister   . Colon cancer Neg Hx   . Colon polyps Neg Hx     Social History Social History  Substance Use Topics  . Smoking status: Former Smoker    Packs/day: 3.00    Years: 20.00    Types: Cigarettes    Quit date: 12/30/1989  . Smokeless tobacco: Never Used  . Alcohol use No     Allergies   Orencia [abatacept]; Carbamazepine; Celecoxib; Cephalexin; Doxycycline; Enbrel [etanercept]; Humira [adalimumab]; Levofloxacin; Sulfa antibiotics; and Sulfasalazine   Review of Systems Review of Systems  All other systems reviewed and are negative.    Physical Exam Updated Vital Signs BP 128/83   Pulse 91   Temp 97.7 F (36.5 C) (Oral)   Resp 18   Ht '5\' 9"'  (1.753 m)   Wt 105.2 kg (232 lb)   SpO2 92%   BMI 34.26 kg/m   Physical Exam  Constitutional: He appears well-developed and well-nourished. No distress.  HENT:  Head: Normocephalic and atraumatic.  Mouth/Throat: Oropharynx is clear and moist. No oropharyngeal exudate.  Eyes: Conjunctivae and EOM are normal. Pupils are equal, round, and reactive to light. Right eye exhibits no discharge. Left eye exhibits no discharge. No scleral icterus.  Neck: Normal range of motion. Neck supple. No JVD present. No thyromegaly present.  Cardiovascular: Normal rate, regular rhythm, normal heart sounds and intact distal pulses.  Exam reveals no gallop and no friction rub.   No murmur heard. Pulmonary/Chest: Effort normal. No respiratory distress. He has no wheezes. He has no rales.  The patient has a normal work of breathing, he has no wheezing rhonchi or rales, he speaks in full sentences. He has no JVD.  Abdominal: Soft. Bowel sounds are normal. He exhibits no distension and no mass. There is no tenderness.  Musculoskeletal: Normal range of motion. He exhibits edema ( 2+  bilateral symmetrical pitting edema. Legs are tight). He exhibits no tenderness.  Lymphadenopathy:    He has no cervical  adenopathy.  Neurological: He is alert. Coordination normal.  Skin: Skin is warm and dry. No rash noted. No erythema.  Psychiatric: He has a normal mood and affect. His behavior is normal.  Nursing note and vitals reviewed.    ED Treatments / Results  Labs (all labs ordered are listed, but only abnormal results are displayed) Labs Reviewed  CBC WITH DIFFERENTIAL/PLATELET - Abnormal; Notable for the following:       Result Value   Neutro Abs 8.6 (*)    All other components within normal limits  COMPREHENSIVE METABOLIC PANEL - Abnormal; Notable for the following:    Potassium 3.4 (*)    Chloride 100 (*)    Glucose, Bld 120 (*)    Creatinine, Ser 1.27 (*)    Calcium 8.8 (*)    Total Protein 6.3 (*)    Albumin 3.3 (*)    GFR calc non Af Amer 57 (*)    All other components within normal limits  PROTIME-INR - Abnormal; Notable for the following:    Prothrombin Time 39.3 (*)    All other components within normal limits  TROPONIN I  BRAIN NATRIURETIC PEPTIDE    EKG  EKG Interpretation  Date/Time:  Saturday July 05 2017 16:32:02 EDT Ventricular Rate:  89 PR Interval:    QRS Duration: 89 QT Interval:  358 QTC Calculation: 436 R Axis:   54 Text Interpretation:  Sinus rhythm Abnormal R-wave progression, early transition Baseline wander in lead(s) V2 since last tracing no significant change Confirmed by Noemi Chapel [phone]) on 07/05/2017 5:43:00 PM       Radiology Dg Chest 2 View  Result Date: 07/05/2017 CLINICAL DATA:  Shortness of Breath EXAM: CHEST  2 VIEW COMPARISON:  May 06, 2017 and September 16, 2016 FINDINGS: There is chronic elevation of the left hemidiaphragm. There is slight atelectasis in the left base. Lungs elsewhere are clear. Heart size and pulmonary vascularity are normal. No adenopathy. There is aortic atherosclerosis. No evident bone lesions.  IMPRESSION: Chronic elevation left hemidiaphragm with slight left base atelectasis. No edema or consolidation. Stable cardiac silhouette. There is aortic atherosclerosis. Aortic Atherosclerosis (ICD10-I70.0). Electronically Signed   By: Lowella Grip III M.D.   On: 07/05/2017 18:10    Procedures Procedures (including critical care time)  Medications Ordered in ED Medications  albuterol (PROVENTIL) (2.5 MG/3ML) 0.083% nebulizer solution 5 mg (5 mg Nebulization Given 07/05/17 1736)     Initial Impression / Assessment and Plan / ED Course  I have reviewed the triage vital signs and the nursing notes.  Pertinent labs & imaging results that were available during my care of the patient were reviewed by me and considered in my medical decision making (see chart for details).  Clinical Course as of Jul 06 2003  Sat Jul 05, 2017  1827 Inform the patient of all of his results including his elevated INR, he states that he started having more general weakness and shortness of breath since starting methotrexate. At this time with a normal x-ray, normal lab work there is no clear etiology as to the patient's symptoms unless it is just worsening COPD. He reports that he did improve with an albuterol nebulizer. He will need to ambulate to see how he does with oxygenation. Overall the patient appears stable, normal troponin, normal BNP, unremarkable x-ray with chronic stable elevation of the left hemidiaphragm. The patient was aware of that finding as well.  [BM]    Clinical Course User Index [BM] Noemi Chapel, MD  The patient has what appears to be increasing dyspnea, he has difficulty laying flat and when we sit him up he breathes better but his lungs are overall clear. I will obtain an EKG, chest x-ray and labs to rule out other sources such as infection, acute coronary syndrome or congestive heart failure. In the meantime he will get albuterol treatments.  On repeat exam the patient did extremely  well, he ambulated around the department a couple of times and had an oxygen of between 9496%. States that he feels better after the albuterol was given. He will be given a prescription for a nebulizer machine, nebulized treatments and an increased dose of prednisone for the next 5 days. He expressed his understanding and agreement.  Final Clinical Impressions(s) / ED Diagnoses   Final diagnoses:  COPD exacerbation (HCC)    New Prescriptions New Prescriptions   ALBUTEROL (PROVENTIL) (2.5 MG/3ML) 0.083% NEBULIZER SOLUTION    Take 3 mLs (2.5 mg total) by nebulization every 6 (six) hours as needed for wheezing or shortness of breath.   PREDNISONE (DELTASONE) 20 MG TABLET    Take 2 tablets (40 mg total) by mouth daily.     Noemi Chapel, MD 07/05/17 2005

## 2017-07-05 NOTE — Discharge Instructions (Signed)
Your testing today has not shown any specific abnormal findings to give [place] a more specific diagnosis. It appears that you have had a worsening of your breathing from your lung disease. You have improved with the breathing treatments.  Please take one breathing treatment of albuterol every 4 hours for the next 24 hours, then every 4 hours as needed.  Please take prednisone 40 mg by mouth once a day for the next 5 days, then return to your normal  daily dose of 15 mg.  Please follow-up with her family doctor within the next several days for further evaluation.  Return to the emergency Department if you should develop severe or worsening symptoms.

## 2017-07-05 NOTE — ED Triage Notes (Signed)
Pt comes in for worsening sob over the last 4-5 days. Pt has hx of COPD and states it feels like that but worse. He feels weak and has had an unsteady gait over the last week. Upon talking to this patient, I noticed that pt had a dilated left eye pupil. No obvious  facial droop of unilateral weakness noted.

## 2017-07-07 ENCOUNTER — Other Ambulatory Visit: Payer: Self-pay | Admitting: Radiology

## 2017-07-07 DIAGNOSIS — M0579 Rheumatoid arthritis with rheumatoid factor of multiple sites without organ or systems involvement: Secondary | ICD-10-CM

## 2017-07-07 DIAGNOSIS — Z79899 Other long term (current) drug therapy: Secondary | ICD-10-CM

## 2017-07-07 DIAGNOSIS — C61 Malignant neoplasm of prostate: Secondary | ICD-10-CM

## 2017-07-07 NOTE — Telephone Encounter (Signed)
Ok to start Acterma IV

## 2017-07-07 NOTE — Telephone Encounter (Signed)
TB Test: 01/02/17 PPD negative Infusion orders placed today including CBC CMP Tylenol and Benadryl appointments are up to date and follow up appointment is scheduled TB gold is due on Jan 2019

## 2017-07-07 NOTE — Telephone Encounter (Signed)
Actemra is covered by patient's insurance.  Okay to start patient on Actemra infusions?  He is currently taking leflunomide and methotrexate injections.

## 2017-07-07 NOTE — Telephone Encounter (Signed)
Informed Mr. Mendibles that Actemra infusions are covered by his insurance.  Patient wants to proceed with Actemra.  Reviewed plan to stop methotrexate.  Patient reports he was scheduled for a dose today but will stop the medication.    Amy, can you send in Actemra infusion orders?

## 2017-07-08 NOTE — Telephone Encounter (Signed)
Called pt to advise orders sent he may call for appt  Left message to give him the phone number to call *he is familiar with process, as he previously has used Orencia infusions

## 2017-07-09 DIAGNOSIS — K219 Gastro-esophageal reflux disease without esophagitis: Secondary | ICD-10-CM | POA: Diagnosis not present

## 2017-07-09 DIAGNOSIS — H6192 Disorder of left external ear, unspecified: Secondary | ICD-10-CM | POA: Diagnosis not present

## 2017-07-12 ENCOUNTER — Other Ambulatory Visit: Payer: Self-pay | Admitting: Gastroenterology

## 2017-07-16 ENCOUNTER — Other Ambulatory Visit: Payer: Self-pay

## 2017-07-16 ENCOUNTER — Other Ambulatory Visit: Payer: Self-pay | Admitting: *Deleted

## 2017-07-16 DIAGNOSIS — Z79899 Other long term (current) drug therapy: Secondary | ICD-10-CM

## 2017-07-16 LAB — CBC WITH DIFFERENTIAL/PLATELET
Basophils Absolute: 79 cells/uL (ref 0–200)
Basophils Relative: 1 %
Eosinophils Absolute: 316 cells/uL (ref 15–500)
Eosinophils Relative: 4 %
HCT: 41.7 % (ref 38.5–50.0)
Hemoglobin: 14.2 g/dL (ref 13.2–17.1)
Lymphocytes Relative: 22 %
Lymphs Abs: 1738 cells/uL (ref 850–3900)
MCH: 30.1 pg (ref 27.0–33.0)
MCHC: 34.1 g/dL (ref 32.0–36.0)
MCV: 88.3 fL (ref 80.0–100.0)
MPV: 9.7 fL (ref 7.5–12.5)
Monocytes Absolute: 474 cells/uL (ref 200–950)
Monocytes Relative: 6 %
Neutro Abs: 5293 cells/uL (ref 1500–7800)
Neutrophils Relative %: 67 %
Platelets: 219 10*3/uL (ref 140–400)
RBC: 4.72 MIL/uL (ref 4.20–5.80)
RDW: 15.1 % — ABNORMAL HIGH (ref 11.0–15.0)
WBC: 7.9 10*3/uL (ref 3.8–10.8)

## 2017-07-17 LAB — COMPLETE METABOLIC PANEL WITH GFR
ALT: 20 U/L (ref 9–46)
AST: 20 U/L (ref 10–35)
Albumin: 3.5 g/dL — ABNORMAL LOW (ref 3.6–5.1)
Alkaline Phosphatase: 106 U/L (ref 40–115)
BUN: 12 mg/dL (ref 7–25)
CO2: 30 mmol/L (ref 20–31)
Calcium: 8.8 mg/dL (ref 8.6–10.3)
Chloride: 101 mmol/L (ref 98–110)
Creat: 0.99 mg/dL (ref 0.70–1.25)
GFR, Est African American: >89 mL/min (ref >=60)
GFR, Est Non African American: 79 mL/min (ref >=60)
Glucose, Bld: 83 mg/dL (ref 65–99)
Potassium: 3.9 mmol/L (ref 3.5–5.3)
Sodium: 145 mmol/L (ref 135–146)
Total Bilirubin: 0.4 mg/dL (ref 0.2–1.2)
Total Protein: 5.9 g/dL — ABNORMAL LOW (ref 6.1–8.1)

## 2017-07-17 NOTE — Progress Notes (Signed)
stable °

## 2017-07-21 ENCOUNTER — Ambulatory Visit (HOSPITAL_COMMUNITY)
Admission: RE | Admit: 2017-07-21 | Discharge: 2017-07-21 | Disposition: A | Discharge status: Home / Self Care | Payer: Medicare Other | Source: Ambulatory Visit | Attending: Rheumatology | Admitting: Rheumatology

## 2017-07-21 DIAGNOSIS — Z79899 Other long term (current) drug therapy: Secondary | ICD-10-CM | POA: Diagnosis not present

## 2017-07-21 DIAGNOSIS — M0579 Rheumatoid arthritis with rheumatoid factor of multiple sites without organ or systems involvement: Secondary | ICD-10-CM

## 2017-07-21 LAB — CBC
HCT: 42.6 % (ref 39.0–52.0)
Hemoglobin: 14.1 g/dL (ref 13.0–17.0)
MCH: 29.6 pg (ref 26.0–34.0)
MCHC: 33.1 g/dL (ref 30.0–36.0)
MCV: 89.5 fL (ref 78.0–100.0)
Platelets: 224 10*3/uL (ref 150–400)
RBC: 4.76 MIL/uL (ref 4.22–5.81)
RDW: 15.5 % (ref 11.5–15.5)
WBC: 9.1 10*3/uL (ref 4.0–10.5)

## 2017-07-21 LAB — COMPREHENSIVE METABOLIC PANEL
ALT: 26 U/L (ref 17–63)
AST: 42 U/L — ABNORMAL HIGH (ref 15–41)
Albumin: 3.3 g/dL — ABNORMAL LOW (ref 3.5–5.0)
Alkaline Phosphatase: 99 U/L (ref 38–126)
Anion gap: 12 (ref 5–15)
BUN: 13 mg/dL (ref 6–20)
CO2: 29 mmol/L (ref 22–32)
Calcium: 8.5 mg/dL — ABNORMAL LOW (ref 8.9–10.3)
Chloride: 100 mmol/L — ABNORMAL LOW (ref 101–111)
Creatinine, Ser: 1.01 mg/dL (ref 0.61–1.24)
GFR calc Af Amer: >60 mL/min (ref >60)
GFR calc non Af Amer: >60 mL/min (ref >60)
Glucose, Bld: 106 mg/dL — ABNORMAL HIGH (ref 65–99)
Potassium: 4.4 mmol/L (ref 3.5–5.1)
Sodium: 141 mmol/L (ref 135–145)
Total Bilirubin: 1.7 mg/dL — ABNORMAL HIGH (ref 0.3–1.2)
Total Protein: 5.9 g/dL — ABNORMAL LOW (ref 6.5–8.1)

## 2017-07-21 MED ORDER — TOCILIZUMAB 400 MG/20ML IV SOLN
4.0000 mg/kg | INTRAVENOUS | Status: DC
  Administered 2017-07-21: 420 mg via INTRAVENOUS
  Filled 2017-07-21: qty 21

## 2017-07-21 MED ORDER — DIPHENHYDRAMINE HCL 25 MG PO CAPS
25.0000 mg | ORAL_CAPSULE | ORAL | Status: DC
  Administered 2017-07-21: 25 mg via ORAL

## 2017-07-21 MED ORDER — ACETAMINOPHEN 325 MG PO TABS
ORAL_TABLET | ORAL | Status: AC
  Filled 2017-07-21: qty 2

## 2017-07-21 MED ORDER — ACETAMINOPHEN 325 MG PO TABS
650.0000 mg | ORAL_TABLET | ORAL | Status: DC
Start: 2017-07-21 — End: 2017-07-22
  Administered 2017-07-21: 650 mg via ORAL

## 2017-07-21 MED ORDER — DIPHENHYDRAMINE HCL 25 MG PO CAPS
ORAL_CAPSULE | ORAL | Status: AC
  Filled 2017-07-21: qty 1

## 2017-07-21 NOTE — Progress Notes (Signed)
Mild elevation of LFTs . Will monitor.

## 2017-07-21 NOTE — Discharge Instructions (Signed)
Tocilizumab injection What is this medicine? TOCILIZUMAB (TOE si LIZ ue mab) is a monoclonal antibody. It is used to treat rheumatoid arthritis, juvenile idiopathic arthritis, giant cell arteritis, and cytokine release syndrome due to chimeric antigen receptor (CAR) T-cell treatment. This medicine may be used for other purposes; ask your health care provider or pharmacist if you have questions. COMMON BRAND NAME(S): Actemra What should I tell my health care provider before I take this medicine? They need to know if you have any of these conditions: -cancer -diabetes -diverticulitis -heart disease -hepatitis B or history of hepatitis B infection -high blood pressure -high cholesterol -history of pancreatitis -immune system problems -infection (especially a virus infection such as chickenpox, cold sores, or herpes) -liver disease -low blood counts, like low white cell, platelet, or red cell counts -multiple sclerosis -recently received or scheduled to receive a vaccine -scheduled to have surgery -stomach ulcer -stroke -tuberculosis, a positive skin test for tuberculosis or have recently been in close contact with someone who has tuberculosis -an unusual or allergic reaction to tocilizumab, other medicines, foods, dyes, or preservatives -pregnant or trying to get pregnant -breast-feeding How should I use this medicine? This medicine is for infusion into a vein or for injection under the skin. It is usually given by a health care professional in a hospital or clinic setting. If you get this medicine at home, you will be taught how to prepare and give this medicine. Use exactly as directed. Take your medicine at regular intervals. Do not take your medicine more often than directed. It is important that you put your used needles and syringes in a special sharps container. Do not put them in a trash can. If you do not have a sharps container, call your pharmacist or healthcare provider to get  one. A special MedGuide will be given to you by the pharmacist with each prescription and refill. Be sure to read this information carefully each time. Talk to your pediatrician regarding the use of this medicine in children. While the drug may be prescribed for children as young as 2 years for selected conditions, precautions do apply. Overdosage: If you think you have taken too much of this medicine contact a poison control center or emergency room at once. NOTE: This medicine is only for you. Do not share this medicine with others. What if I miss a dose? It is important not to miss your dose. Call your doctor or health care professional if you are unable to keep an appointment. If you give yourself the medicine and you miss a dose, take it as soon as you can. If it is almost time for your next dose, take only that dose. Do not take double or extra doses. What may interact with this medicine? Do not take this medicine with any of the following medications: -live virus vaccines This medicine may also interact with the following medications: -abatacept -adalimumab -anakinra -atorvastatin -certolizumab -cyclosporine -dextromethorphan -etanercept -golimumab -infliximab -lovastatin -medicines that lower the immune system -ofatumumab -omeprazole -oral contraceptives -rituximab -simvastatin -steroid medicines like prednisone or cortisone -theophylline -tositumomab -vaccines -warfarin This list may not describe all possible interactions. Give your health care provider a list of all the medicines, herbs, non-prescription drugs, or dietary supplements you use. Also tell them if you smoke, drink alcohol, or use illegal drugs. Some items may interact with your medicine. What should I watch for while using this medicine? Tell your doctor or healthcare professional if your symptoms do not start to get better or   if they get worse. Your condition will be monitored carefully while you are  receiving this medicine. You will be tested for tuberculosis (TB) before you start this medicine. If your doctor prescribes any medicine for TB, you should start taking the TB medicine before starting this medicine. Make sure to finish the full course of TB medicine. Talk to your doctor about your risk of cancer. You may be more at risk for certain types of cancers if you take this medicine. Call your doctor or health care professional for advice if you get a fever, chills or sore throat, or other symptoms of a cold or flu. Do not treat yourself. This drug decreases your body's ability to fight infections. Try to avoid being around people who are sick. You may need blood work done while you are taking this medicine. What side effects may I notice from receiving this medicine? Side effects that you should report to your doctor or health care professional as soon as possible: -allergic reactions like skin rash, itching or hives, swelling of the face, lips, or tongue -breathing problems -feeling faint or lightheaded, falls -fever or chills, sore throat -stomach pain -unusual bleeding or bruising -yellowing of the eyes or skin Side effects that usually do not require medical attention (report to your doctor or health care professional if they continue or are bothersome): -dizziness -headache -pain, redness, or irritation at site where injected This list may not describe all possible side effects. Call your doctor for medical advice about side effects. You may report side effects to FDA at 1-800-FDA-1088. Where should I keep my medicine? Keep out of the reach of children. If you are using this medicine at home, you will be instructed on how to store this medicine. Throw away any unused medicine after the expiration date on the label. NOTE: This sheet is a summary. It may not cover all possible information. If you have questions about this medicine, talk to your doctor, pharmacist, or health care  provider.  2018 Elsevier/Gold Standard (2016-09-03 12:55:30)  

## 2017-08-05 ENCOUNTER — Other Ambulatory Visit: Payer: Self-pay | Admitting: Radiology

## 2017-08-10 ENCOUNTER — Other Ambulatory Visit: Payer: Self-pay | Admitting: Gastroenterology

## 2017-08-11 DIAGNOSIS — Z1159 Encounter for screening for other viral diseases: Secondary | ICD-10-CM | POA: Diagnosis not present

## 2017-08-11 DIAGNOSIS — Z79899 Other long term (current) drug therapy: Secondary | ICD-10-CM | POA: Diagnosis not present

## 2017-08-11 DIAGNOSIS — Z1389 Encounter for screening for other disorder: Secondary | ICD-10-CM | POA: Diagnosis not present

## 2017-08-11 DIAGNOSIS — M0579 Rheumatoid arthritis with rheumatoid factor of multiple sites without organ or systems involvement: Secondary | ICD-10-CM | POA: Diagnosis not present

## 2017-08-11 DIAGNOSIS — M150 Primary generalized (osteo)arthritis: Secondary | ICD-10-CM | POA: Diagnosis not present

## 2017-08-11 DIAGNOSIS — M05749 Rheumatoid arthritis with rheumatoid factor of unspecified hand without organ or systems involvement: Secondary | ICD-10-CM | POA: Diagnosis not present

## 2017-08-11 DIAGNOSIS — M19042 Primary osteoarthritis, left hand: Secondary | ICD-10-CM | POA: Diagnosis not present

## 2017-08-11 DIAGNOSIS — M19041 Primary osteoarthritis, right hand: Secondary | ICD-10-CM | POA: Diagnosis not present

## 2017-08-13 DIAGNOSIS — M2550 Pain in unspecified joint: Secondary | ICD-10-CM | POA: Diagnosis not present

## 2017-08-13 DIAGNOSIS — M0579 Rheumatoid arthritis with rheumatoid factor of multiple sites without organ or systems involvement: Secondary | ICD-10-CM | POA: Diagnosis not present

## 2017-08-13 DIAGNOSIS — M1A0790 Idiopathic chronic gout, unspecified ankle and foot, without tophus (tophi): Secondary | ICD-10-CM | POA: Diagnosis not present

## 2017-08-15 ENCOUNTER — Other Ambulatory Visit (HOSPITAL_COMMUNITY): Payer: Self-pay | Admitting: *Deleted

## 2017-08-18 ENCOUNTER — Encounter (HOSPITAL_COMMUNITY)
Admission: RE | Admit: 2017-08-18 | Discharge: 2017-08-18 | Disposition: A | Discharge status: Home / Self Care | Payer: Medicare Other | Source: Ambulatory Visit | Attending: Rheumatology | Admitting: Rheumatology

## 2017-08-18 DIAGNOSIS — M0579 Rheumatoid arthritis with rheumatoid factor of multiple sites without organ or systems involvement: Secondary | ICD-10-CM

## 2017-08-18 DIAGNOSIS — Z79899 Other long term (current) drug therapy: Secondary | ICD-10-CM

## 2017-08-18 MED ORDER — ACETAMINOPHEN 325 MG PO TABS: 650.0000 mg | ORAL_TABLET | ORAL | Status: DC

## 2017-08-18 MED ORDER — TOCILIZUMAB 400 MG/20ML IV SOLN
4.0000 mg/kg | INTRAVENOUS | Status: DC
  Administered 2017-08-18: 418 mg via INTRAVENOUS
  Filled 2017-08-18: qty 20.9

## 2017-08-18 MED ORDER — DIPHENHYDRAMINE HCL 25 MG PO CAPS: 25.0000 mg | ORAL_CAPSULE | ORAL | Status: DC

## 2017-08-27 DIAGNOSIS — Z7901 Long term (current) use of anticoagulants: Secondary | ICD-10-CM | POA: Diagnosis not present

## 2017-08-27 DIAGNOSIS — C61 Malignant neoplasm of prostate: Secondary | ICD-10-CM | POA: Diagnosis not present

## 2017-08-29 ENCOUNTER — Other Ambulatory Visit: Payer: Self-pay | Admitting: Rheumatology

## 2017-08-29 NOTE — Telephone Encounter (Signed)
Last Visit: 06/25/17 Next Visit: 09/29/17 Labs: 07/21/17 Mild elevation of LFTs . Will monitor.  Okay to refill per Dr. Estanislado Pandy

## 2017-09-03 ENCOUNTER — Ambulatory Visit (INDEPENDENT_AMBULATORY_CARE_PROVIDER_SITE_OTHER): Payer: Medicare Other | Admitting: Urology

## 2017-09-03 DIAGNOSIS — R3915 Urgency of urination: Secondary | ICD-10-CM | POA: Diagnosis not present

## 2017-09-03 DIAGNOSIS — Z7901 Long term (current) use of anticoagulants: Secondary | ICD-10-CM | POA: Diagnosis not present

## 2017-09-03 DIAGNOSIS — C61 Malignant neoplasm of prostate: Secondary | ICD-10-CM

## 2017-09-03 DIAGNOSIS — N5231 Erectile dysfunction following radical prostatectomy: Secondary | ICD-10-CM | POA: Diagnosis not present

## 2017-09-08 DIAGNOSIS — Z0000 Encounter for general adult medical examination without abnormal findings: Secondary | ICD-10-CM | POA: Diagnosis not present

## 2017-09-08 DIAGNOSIS — Z6835 Body mass index (BMI) 35.0-35.9, adult: Secondary | ICD-10-CM | POA: Diagnosis not present

## 2017-09-08 DIAGNOSIS — Z23 Encounter for immunization: Secondary | ICD-10-CM | POA: Diagnosis not present

## 2017-09-08 DIAGNOSIS — E6609 Other obesity due to excess calories: Secondary | ICD-10-CM | POA: Diagnosis not present

## 2017-09-09 ENCOUNTER — Other Ambulatory Visit: Payer: Self-pay | Admitting: Rheumatology

## 2017-09-09 NOTE — Telephone Encounter (Signed)
Last Visit: 06/25/17 Next Visit: 09/29/17 Labs: 07/21/17 Mild elevation of LFTs . Will monitor.  Okay to refill per Dr. Estanislado Pandy

## 2017-09-10 ENCOUNTER — Other Ambulatory Visit: Payer: Self-pay | Admitting: Gastroenterology

## 2017-09-10 ENCOUNTER — Telehealth: Payer: Self-pay | Admitting: Rheumatology

## 2017-09-10 NOTE — Telephone Encounter (Signed)
His back pain would not be from the RA, this would be a separate problem, he can come in for xray, will find a spot to work him in.  Left message for him to call me back.   Can we work him in on Friday ? Can we discuss?

## 2017-09-10 NOTE — Telephone Encounter (Signed)
Patient has been experiencing pain in upper back that comes around right side now for about 1 month. Patient has had 2 injections at this point. Patient is not sure when injections are suppose to help. He feels like he has gotten some relief. Please call to advise.

## 2017-09-10 NOTE — Telephone Encounter (Signed)
Disregard message, I worked him in for tomorrow, will get xrays if appropriate, advised him this is not from RA we will evaluate

## 2017-09-11 ENCOUNTER — Ambulatory Visit (INDEPENDENT_AMBULATORY_CARE_PROVIDER_SITE_OTHER): Payer: Medicare Other | Admitting: Rheumatology

## 2017-09-11 ENCOUNTER — Ambulatory Visit (INDEPENDENT_AMBULATORY_CARE_PROVIDER_SITE_OTHER): Payer: Medicare Other

## 2017-09-11 ENCOUNTER — Encounter: Payer: Self-pay | Admitting: Rheumatology

## 2017-09-11 VITALS — BP 158/82 | HR 86 | Resp 16 | Ht 69.0 in | Wt 234.0 lb

## 2017-09-11 DIAGNOSIS — Z8709 Personal history of other diseases of the respiratory system: Secondary | ICD-10-CM

## 2017-09-11 DIAGNOSIS — Z8669 Personal history of other diseases of the nervous system and sense organs: Secondary | ICD-10-CM | POA: Diagnosis not present

## 2017-09-11 DIAGNOSIS — L408 Other psoriasis: Secondary | ICD-10-CM | POA: Diagnosis not present

## 2017-09-11 DIAGNOSIS — M1A09X0 Idiopathic chronic gout, multiple sites, without tophus (tophi): Secondary | ICD-10-CM | POA: Diagnosis not present

## 2017-09-11 DIAGNOSIS — M0579 Rheumatoid arthritis with rheumatoid factor of multiple sites without organ or systems involvement: Secondary | ICD-10-CM | POA: Diagnosis not present

## 2017-09-11 DIAGNOSIS — Z86010 Personal history of colonic polyps: Secondary | ICD-10-CM

## 2017-09-11 DIAGNOSIS — Z8546 Personal history of malignant neoplasm of prostate: Secondary | ICD-10-CM | POA: Diagnosis not present

## 2017-09-11 DIAGNOSIS — Z8719 Personal history of other diseases of the digestive system: Secondary | ICD-10-CM | POA: Diagnosis not present

## 2017-09-11 DIAGNOSIS — Z8679 Personal history of other diseases of the circulatory system: Secondary | ICD-10-CM | POA: Diagnosis not present

## 2017-09-11 DIAGNOSIS — S22000A Wedge compression fracture of unspecified thoracic vertebra, initial encounter for closed fracture: Secondary | ICD-10-CM

## 2017-09-11 DIAGNOSIS — Z86718 Personal history of other venous thrombosis and embolism: Secondary | ICD-10-CM

## 2017-09-11 DIAGNOSIS — M546 Pain in thoracic spine: Secondary | ICD-10-CM

## 2017-09-11 DIAGNOSIS — Z79899 Other long term (current) drug therapy: Secondary | ICD-10-CM | POA: Diagnosis not present

## 2017-09-11 DIAGNOSIS — L403 Pustulosis palmaris et plantaris: Secondary | ICD-10-CM

## 2017-09-11 DIAGNOSIS — Z7952 Long term (current) use of systemic steroids: Secondary | ICD-10-CM

## 2017-09-11 DIAGNOSIS — Z7901 Long term (current) use of anticoagulants: Secondary | ICD-10-CM | POA: Diagnosis not present

## 2017-09-11 MED ORDER — PREDNISONE 10 MG PO TABS: ORAL_TABLET | ORAL | 0 refills | Status: DC

## 2017-09-11 MED ORDER — PREDNISONE 5 MG PO TABS: ORAL_TABLET | ORAL | 1 refills | Status: DC

## 2017-09-11 NOTE — Progress Notes (Signed)
Office Visit Note  Patient: [name]             Date of Birth: 03/27/1951           MRN: [id number]             PCP: [name], MD Referring: [name], MD Visit Date: 09/11/2017 Occupation: @GUAROCC @    Subjective:  Pain and swelling in hands.   History of Present Illness: ZYMIERE TROSTLE is a 66 y.o. male with history of sero positive rheumatoid arthritis. He started on Actemra infusions in June 2018. His been also tapering schedule of prednisone. He was on 30 mg of prednisone about 2 months ago. And now he is on 20 mg a day. He still complains of some pain and stiffness in his hands. He continues to have some neck pain. He also complains of thoracic pain. Especially well he lifts his arms.   Activities of Daily Living: 15 Patient reports morning stiffness for  minutes.   Patient Denies nocturnal pain.  Difficulty dressing/grooming: Denies Difficulty climbing stairs: Denies Difficulty getting out of chair: Denies Difficulty using hands for taps, buttons, cutlery, and/or writing: Denies   Review of Systems  Constitutional: Positive for fatigue. Negative for night sweats and weakness ( ).  HENT: Positive for mouth dryness. Negative for mouth sores and nose dryness.   Eyes: Negative for redness and dryness.  Respiratory: Negative for shortness of breath and difficulty breathing.   Cardiovascular: Negative for chest pain, palpitations, hypertension, irregular heartbeat and swelling in legs/feet.  Gastrointestinal: Negative for constipation and diarrhea.  Endocrine: Negative for increased urination.  Musculoskeletal: Positive for arthralgias, joint pain and morning stiffness. Negative for joint swelling, myalgias, muscle weakness, muscle tenderness and myalgias.  Skin: Negative for color change, rash, hair loss, nodules/bumps, skin tightness, ulcers and sensitivity to sunlight.  Allergic/Immunologic: Negative for susceptible to infections.  Neurological:  Negative for dizziness, fainting, memory loss and night sweats.  Hematological: Negative for swollen glands.  Psychiatric/Behavioral: Positive for depressed mood and sleep disturbance. The patient is nervous/anxious.     PMFS History:  Patient Active Problem List   Diagnosis Date Noted  . History of seizure disorder 02/27/2017  . Primary osteoarthritis of both hands 02/27/2017  . Primary osteoarthritis of both feet 02/27/2017  . Idiopathic gout of multiple sites 02/27/2017  . High risk medication use 12/29/2016  . History of prostate cancer 12/29/2016  . Primary osteoarthritis of both knees 12/29/2016  . Chronic idiopathic gout involving toe without tophus 12/29/2016  . History of CHF (congestive heart failure) 12/29/2016  . History of COPD 12/29/2016  . Plantar pustular psoriasis 12/29/2016  . DVT (deep venous thrombosis) ([place]) 10/31/2016  . COPD (chronic obstructive pulmonary disease) ([place]) 10/31/2016  . CHF (congestive heart failure) ([place]) 10/31/2016  . Prostate cancer ([place]) 08/30/2015  . Malignant neoplasm of prostate ([place]) 07/04/2015  . Chest pain at rest 07/05/2014  . Weakness 07/05/2014  . Complicated postphlebitic syndrome 11/16/2013  . Personal history of DVT (deep vein thrombosis) 05/18/2013  . Chronic anticoagulation 05/18/2013  . Diverticulosis of colon without hemorrhage 05/18/2013  . Rheumatoid arthritis ([place]) 05/18/2013  . Seizure disorder ([place]) 05/18/2013  . Hx of adenomatous colonic polyps 05/18/2013  . GERD 05/08/2010  . NAUSEA 05/08/2010  . FLATULENCE-GAS-BLOATING 05/08/2010    Past Medical History:  Diagnosis Date  . Anxiety    hx of   . Arthritis   . Asthma   . Clotting disorder ([place])  DVT both legs   . COPD (chronic obstructive pulmonary disease) ([place])   . DDD (degenerative disc disease), cervical    with UE's paresthesias  . Diverticulosis   . DVT, lower extremity ([place])    bilat  . 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 GERD (gastroesophageal reflux disease)   . Gout   . Heart murmur   . History of measles   . History of shingles   . Hypercholesterolemia   . IBS (irritable bowel syndrome)   . IBS (irritable bowel syndrome)   . Peripheral edema   . Pneumonia    hx of   . Prostate cancer ([place])   . Seizures ([place])    last seizure 20 years ago   . Shortness of breath dyspnea    exertion   . Sleep apnea    not on cpap  . Tubular adenoma of colon 04/2008    Family History  Problem Relation Age of Onset  . Skin cancer Father   . Stomach cancer Father   . Heart attack Father   . Alzheimer's disease Mother   . Throat cancer Brother   . Stomach cancer Brother   . Lung cancer Brother   . Heart attack Brother   . Heart attack Brother   . Breast cancer Sister   . Heart attack Sister   . Stroke Sister   . Bladder Cancer Sister   . Heart murmur Sister   . Colon cancer Neg Hx   . Colon polyps Neg Hx    Past Surgical History:  Procedure Laterality Date  . CHOLECYSTECTOMY    . COLONOSCOPY    . DOPPLER ECHOCARDIOGRAPHY N/A 01-21-2012   TECHNICALLY DIFFICULT. MILD CONCENTRIC LV HYPERTROPHY. LV CAVITY IS SMALL.. EF=> 55%. TRANSMITRAL SPECTRAL FLOW PATTREN IS SUGGESTIVE OF IMPAIRED LV RELAXATION. RV SYSTOLIC PRESSURE IS 16XWRU. LEFT ATRIAL SIZE IS NORMAL. AV APPEARS MILDLY SCLEROTIC. NO SIGN VALVE DISEASE NOTED.  [name] LYMPHADENECTOMY Bilateral 08/30/2015   Procedure: PELVIC LYMPHADENECTOMY;  Surgeon: [name], MD;  Location: [facility];  Service: Urology;  Laterality: Bilateral;  . NUCLEAR STRESS TEST N/A 01-21-2012   NORMAL PATTERN OF PERFUSION IN ALL REGIONS. POST STRESS LV SIZE IS NORMAL. NO EVIDENCE OF INDUCIBLE ISCHEMIA. EF 54%.  [name] POLYPECTOMY    . PROSTATE BIOPSY    . ROBOT ASSISTED LAPAROSCOPIC RADICAL PROSTATECTOMY N/A 08/30/2015   Procedure: ROBOTIC ASSISTED LAPAROSCOPIC RADICAL PROSTATECTOMY;  Surgeon: [name], MD;  Location: [facility];  Service:  Urology;  Laterality: N/A;  . US VENOUS LOWER EXT Right 03/07/11   PERSISTENT DVT IN RIGHT LOWER EXT.WITH PERSISTANT VISUALIZATION OF HYPOECHOIC THROMBUS WITHIN THE FEMORAL, PROFUNDA FEMORAL AND POPLITEAL VEINS. WHEN COMPARED TO PREVIOUS, CLOT IS NO LONGER IDENTIFIED WITH IN THE RIGHT CFV.   Social History   Social History Narrative  . No narrative on file     Objective: Vital Signs: BP (!) 158/82   Pulse 86   Resp 16   Ht 5\' 9"  (1.753 m)   Wt 234 lb (106.1 kg)   BMI 34.56 kg/m    Physical Exam  Constitutional: He is oriented to person, place, and time. He appears well-developed and well-nourished.  HENT:  Head: Normocephalic and atraumatic.  Eyes: Pupils are equal, round, and reactive to light. Conjunctivae and EOM are normal.  Neck: Normal range of motion. Neck supple.  Cardiovascular: Normal rate, regular rhythm and normal heart sounds.  Bilateral pedal edema  Pulmonary/Chest: Effort normal and breath sounds normal.  Abdominal: Soft. Bowel sounds are normal.  Neurological: He is alert and oriented to person, place, and time.  Skin: Skin is warm and dry. Capillary refill takes less than 2 seconds.  Psychiatric: He has a normal mood and affect. His behavior is normal.  Nursing note and vitals reviewed.    Musculoskeletal Exam: C-spine limited range of motion. He has discomfort with range of motion of his thoracic spine with tenderness across thoracic spine. No point tenderness was noted. Shoulder joints elbow joints were good range of motion. He is some synovial thickening over her wrist joints and MCP joints but no synovitis was noted. He also has thickening of PIP/DIP joints in his bilateral hands. Hip joints knee joints ankles MTPs PIPs DIPs are good range of motion. He had pedal edema over bilateral lower extremities.  CDAI Exam: CDAI Homunculus Exam:   Joint Counts:  CDAI Tender Joint count: 0 CDAI Swollen Joint count: 0  Global Assessments:  Patient Global  Assessment: 2 Provider Global Assessment: 2  CDAI Calculated Score: 4    Investigation: Findings:  01/02/17 negative PPD   CBC Latest Ref Rng & Units 07/21/2017 07/16/2017 07/05/2017  WBC 4.0 - 10.5 K/uL 9.1 7.9 10.5  Hemoglobin 13.0 - 17.0 g/dL 14.1 14.2 14.6  Hematocrit 39.0 - 52.0 % 42.6 41.7 43.4  Platelets 150 - 400 K/uL 224 219 197    CMP Latest Ref Rng & Units 07/21/2017 07/16/2017 07/05/2017  Glucose 65 - 99 mg/dL 106(H) 83 120(H)  BUN 6 - 20 mg/dL 13 12 16   Creatinine 0.61 - 1.24 mg/dL 1.01 0.99 1.27(H)  Sodium 135 - 145 mmol/L 141 145 143  Potassium 3.5 - 5.1 mmol/L 4.4 3.9 3.4(L)  Chloride 101 - 111 mmol/L 100(L) 101 100(L)  CO2 22 - 32 mmol/L 29 30 32  Calcium 8.9 - 10.3 mg/dL 8.5(L) 8.8 8.8(L)  Total Protein 6.5 - 8.1 g/dL 5.9(L) 5.9(L) 6.3(L)  Total Bilirubin 0.3 - 1.2 mg/dL 1.7(H) 0.4 0.6  Alkaline Phos 38 - 126 U/L 99 106 108  AST 15 - 41 U/L 42(H) 20 32  ALT 17 - 63 U/L 26 20 34    Imaging: Xr Thoracic Spine 2 View  Result Date: 09/11/2017 Multilevel spondylosis and mild kyphosis was noted. He also has some oral compression fractures from T5, T6, T7 ,T8, and T9 vertebrae. Impression: Mild is spondylosis with multiple stretch compression fractures.   Speciality Comments: No specialty comments available.    Procedures:  No procedures performed Allergies: Orencia [abatacept]; Carbamazepine; Celecoxib; Cephalexin; Doxycycline; Enbrel [etanercept]; Humira [adalimumab]; Levofloxacin; Sulfa antibiotics; and Sulfasalazine   Assessment / Plan:     Visit Diagnoses: Rheumatoid arthritis involving multiple sites with positive rheumatoid factor ([place]): He is doing better after starting Actemra infusion. Although he has not tapered prednisone much. He still on prednisone 20 mg a day. We had detailed discussion regarding the side effects of long-term use of prednisone. The prednisone taper is a schedule was discussed. The plan is to decrease prednisone by 2.5 mg every 2 weeks.  If she develops a flare is supposed to notify us. He will continue on combination of Arava and Actemra infusions.  High risk medication use - Actemra infusion (05/2017) , Arava 20 mg po qd and prednisone 20 mg by mouth daily. His LFTs are slightly elevated we will continue to monitor them.   Idiopathic chronic gout of multiple sites without tophus - Allopurinol 200 mg  by mouth daily. He has not had any gout flare.   On prednisone therapy - Plan: DG DXA FRACTURE ASSESSMENT  Pain in thoracic spine - Plan: XR Thoracic Spine 2 View. The x-ray of thoracic is spine today revealed this disease and old compression fractures.   Plantar pustular psoriasis  Closed wedge fracture of thoracic vertebra, unspecified thoracic vertebral level, initial encounter ([place]) - Plan: DG DXA FRACTURE ASSESSMENT  His other medical problems are listed as follows:   History of prostate cancer  History of CHF (congestive heart failure) : He does have some pedal edema.  Personal history of DVT (deep vein thrombosis)  Chronic anticoagulation - On warfarin  History of COPD  History of seizure disorder  Hx of adenomatous colonic polyps  History of diverticulosis  History of gastroesophageal reflux (GERD)    Orders: Orders Placed This Encounter  Procedures  . XR Thoracic Spine 2 View  . DG DXA FRACTURE ASSESSMENT   Meds ordered this encounter  Medications  . predniSONE (DELTASONE) 10 MG tablet    Sig: Taper as instructed Decrease prednisone as follows 17.5mg  , 15 mg ,12.5 mg 10 mg , 5 mg , then 2.5mg     Dispense:  90 tablet    Refill:  0  . predniSONE (DELTASONE) 5 MG tablet    Sig: Taper as instructed Decrease prednisone as follows 17.5mg  , 15 mg ,12.5 mg 10 mg , 5 mg , then 2.5mg  (every 2 weeks)    Dispense:  100 tablet    Refill:  1    Face-to-face time spent with patient was 30 minutes. Greater than 50% of time was spent in counseling and coordination of care.  Follow-Up Instructions: Return  in about 3 months (around 12/11/2017) for Rheumatoid arthritis, Gout.   Bo Merino, MD  Note - This record has been created using Editor, commissioning.  Chart creation errors have been sought, but may not always  have been located. Such creation errors do not reflect on  the standard of medical care.

## 2017-09-11 NOTE — Patient Instructions (Addendum)
Decrease prednisone as follows 17.5 mg for 2 weeks (one 10 mg one and one half 5mg  tablets) 15 mg for 2 weeks (one 10 mg and one 5 mg tablet) 12.5mg  for 2 weeks (one 10mg  and one half 5 mg tablet) 10 mg for 2 weeks  7.5mg  for 2 weeks (one and one half 5mg  tablet) 5 mg for 2 weeks  2.5 mg for 2 weeks (one half of a 5 mg tablet)  Let us know if you have problems decreasing dose or if you have a flare   Use Vitamin D 1000-2000 units per day and make sure you are getting calcium 1200 mg per day, either in your diet or as a supplement

## 2017-09-12 ENCOUNTER — Other Ambulatory Visit (HOSPITAL_COMMUNITY): Payer: Self-pay | Admitting: *Deleted

## 2017-09-15 ENCOUNTER — Encounter (HOSPITAL_COMMUNITY)
Admission: RE | Admit: 2017-09-15 | Discharge: 2017-09-15 | Disposition: A | Discharge status: Home / Self Care | Payer: Medicare Other | Source: Ambulatory Visit | Attending: Rheumatology | Admitting: Rheumatology

## 2017-09-15 DIAGNOSIS — Z79899 Other long term (current) drug therapy: Secondary | ICD-10-CM

## 2017-09-15 DIAGNOSIS — M0579 Rheumatoid arthritis with rheumatoid factor of multiple sites without organ or systems involvement: Secondary | ICD-10-CM | POA: Diagnosis not present

## 2017-09-15 LAB — COMPREHENSIVE METABOLIC PANEL
ALT: 32 U/L (ref 17–63)
AST: 30 U/L (ref 15–41)
Albumin: 3.5 g/dL (ref 3.5–5.0)
Alkaline Phosphatase: 71 U/L (ref 38–126)
Anion gap: 8 (ref 5–15)
BUN: 13 mg/dL (ref 6–20)
CO2: 31 mmol/L (ref 22–32)
Calcium: 8.8 mg/dL — ABNORMAL LOW (ref 8.9–10.3)
Chloride: 102 mmol/L (ref 101–111)
Creatinine, Ser: 1.17 mg/dL (ref 0.61–1.24)
GFR calc Af Amer: >60 mL/min (ref >60)
GFR calc non Af Amer: >60 mL/min (ref >60)
Glucose, Bld: 111 mg/dL — ABNORMAL HIGH (ref 65–99)
Potassium: 3.2 mmol/L — ABNORMAL LOW (ref 3.5–5.1)
Sodium: 141 mmol/L (ref 135–145)
Total Bilirubin: 0.7 mg/dL (ref 0.3–1.2)
Total Protein: 5.9 g/dL — ABNORMAL LOW (ref 6.5–8.1)

## 2017-09-15 LAB — CBC
HCT: 45.7 % (ref 39.0–52.0)
Hemoglobin: 14.9 g/dL (ref 13.0–17.0)
MCH: 29.6 pg (ref 26.0–34.0)
MCHC: 32.6 g/dL (ref 30.0–36.0)
MCV: 90.9 fL (ref 78.0–100.0)
Platelets: 167 10*3/uL (ref 150–400)
RBC: 5.03 MIL/uL (ref 4.22–5.81)
RDW: 14.5 % (ref 11.5–15.5)
WBC: 10.7 10*3/uL — ABNORMAL HIGH (ref 4.0–10.5)

## 2017-09-15 MED ORDER — DIPHENHYDRAMINE HCL 25 MG PO CAPS
25.0000 mg | ORAL_CAPSULE | ORAL | Status: DC
  Administered 2017-09-15: 25 mg via ORAL

## 2017-09-15 MED ORDER — ACETAMINOPHEN 325 MG PO TABS
ORAL_TABLET | ORAL | Status: AC
  Filled 2017-09-15: qty 2

## 2017-09-15 MED ORDER — TOCILIZUMAB 400 MG/20ML IV SOLN
4.0000 mg/kg | INTRAVENOUS | Status: DC
  Administered 2017-09-15: 424 mg via INTRAVENOUS
  Filled 2017-09-15: qty 21.2

## 2017-09-15 MED ORDER — DIPHENHYDRAMINE HCL 25 MG PO CAPS
ORAL_CAPSULE | ORAL | Status: AC
  Filled 2017-09-15: qty 1

## 2017-09-15 MED ORDER — ACETAMINOPHEN 325 MG PO TABS
650.0000 mg | ORAL_TABLET | ORAL | Status: DC
  Administered 2017-09-15: 650 mg via ORAL

## 2017-09-15 NOTE — Progress Notes (Signed)
Notify Pt and PCP of low K

## 2017-09-16 NOTE — Progress Notes (Signed)
Office Visit Note  Patient: [name]             Date of Birth: 12-Jul-1951           MRN: [id number]             PCP: [name], MD Referring: [name], MD Visit Date: 09/29/2017 Occupation: @GUAROCC @    Subjective:  Pain in hands , elbows, feet, neck and lower back.   History of Present Illness: DAINE GUNTHER is a 66 y.o. male with sero positive rheumatoid arthritis. He states he has not noticed much improvement from Actemra so far. He has had 3 infusions of Actemra. He is also taking Arava 20 mg a day with prednisone 10 mg a day. He reports pain and discomfort in his bilateral hands, bilateral elbows, bilateral feet, neck and lower back. He denies any joint swelling. He was seen in the emergency room lately which shortness of breath. He states at the ER he was diagnosed with possible COPD exacerbation. He was given breathing treatment, 40 mg of prednisone 1. Patient believes that he had a panic attack. He continues to have some shortness of breath.  Activities of Daily Living:  Patient reports morning stiffness for all day .   Patient Reports nocturnal pain.  Difficulty dressing/grooming: Denies Difficulty climbing stairs: Reports Difficulty getting out of chair: Reports Difficulty using hands for taps, buttons, cutlery, and/or writing: Denies   Review of Systems  Constitutional: Positive for fatigue. Negative for night sweats and weakness ( ).  HENT: Negative for mouth sores, mouth dryness, nose dryness and sore tongue.   Eyes: Negative.  Negative for redness and dryness.  Respiratory: Positive for shortness of breath and difficulty breathing. Negative for cough.   Cardiovascular: Negative for chest pain, palpitations, hypertension, irregular heartbeat and swelling in legs/feet.       Has felt chest tightness went to the Emergency Room  Gastrointestinal: Negative for abdominal pain, constipation and diarrhea.  Endocrine: Negative.  Negative for increased  urination.  Genitourinary: Negative.   Musculoskeletal: Positive for arthralgias, joint pain, joint swelling and morning stiffness. Negative for gait problem, myalgias, muscle weakness, muscle tenderness and myalgias.  Skin: Negative for color change, rash, hair loss, nodules/bumps, skin tightness, ulcers and sensitivity to sunlight.       Face and scalp  Allergic/Immunologic: Negative for susceptible to infections.  Neurological: Negative for dizziness, fainting, memory loss and night sweats.       Negative  Hematological: Negative.  Negative for swollen glands.  Psychiatric/Behavioral: Positive for depressed mood. Negative for sleep disturbance. The patient is nervous/anxious.     PMFS History:  Patient Active Problem List   Diagnosis Date Noted  . History of seizure disorder 02/27/2017  . Primary osteoarthritis of both hands 02/27/2017  . Primary osteoarthritis of both feet 02/27/2017  . Idiopathic gout of multiple sites 02/27/2017  . High risk medication use 12/29/2016  . History of prostate cancer 12/29/2016  . Primary osteoarthritis of both knees 12/29/2016  . Chronic idiopathic gout involving toe without tophus 12/29/2016  . History of CHF (congestive heart failure) 12/29/2016  . History of COPD 12/29/2016  . Plantar pustular psoriasis 12/29/2016  . DVT (deep venous thrombosis) ([place]) 10/31/2016  . COPD (chronic obstructive pulmonary disease) ([place]) 10/31/2016  . CHF (congestive heart failure) ([place]) 10/31/2016  . Prostate cancer ([place]) 08/30/2015  . Malignant neoplasm of prostate ([place]) 07/04/2015  . Chest pain at rest 07/05/2014  . Weakness 07/05/2014  .  Complicated postphlebitic syndrome 11/16/2013  . Personal history of DVT (deep vein thrombosis) 05/18/2013  . Chronic anticoagulation 05/18/2013  . Diverticulosis of colon without hemorrhage 05/18/2013  . Rheumatoid arthritis ([place]) 05/18/2013  . Seizure disorder ([place]) 05/18/2013  . Hx of adenomatous colonic polyps  05/18/2013  . GERD 05/08/2010  . NAUSEA 05/08/2010  . FLATULENCE-GAS-BLOATING 05/08/2010    Past Medical History:  Diagnosis Date  . Anxiety    hx of   . Arthritis   . Asthma   . Clotting disorder (H[facility])    DVT both legs   . COPD (chronic obstructive pulmonary disease) ([place])   . DDD (degenerative disc disease), cervical    with UE's paresthesias  . Diverticulosis   . DVT, lower extremity ([place])    bilat  . 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 GERD (gastroesophageal reflux disease)   . Gout   . Heart murmur   . History of measles   . History of shingles   . Hypercholesterolemia   . IBS (irritable bowel syndrome)   . IBS (irritable bowel syndrome)   . Peripheral edema   . Pneumonia    hx of   . Prostate cancer ([place])   . Seizures ([place])    last seizure 20 years ago   . Shortness of breath dyspnea    exertion   . Sleep apnea    not on cpap  . Tubular adenoma of colon 04/2008    Family History  Problem Relation Age of Onset  . Skin cancer Father   . Stomach cancer Father   . Heart attack Father   . Alzheimer's disease Mother   . Throat cancer Brother   . Stomach cancer Brother   . Lung cancer Brother   . Heart attack Brother   . Heart attack Brother   . Breast cancer Sister   . Heart attack Sister   . Stroke Sister   . Bladder Cancer Sister   . Heart murmur Sister   . Colon cancer Neg Hx   . Colon polyps Neg Hx    Past Surgical History:  Procedure Laterality Date  . CHOLECYSTECTOMY    . COLONOSCOPY    . DOPPLER ECHOCARDIOGRAPHY N/A 01-21-2012   TECHNICALLY DIFFICULT. MILD CONCENTRIC LV HYPERTROPHY. LV CAVITY IS SMALL.. EF=> 55%. TRANSMITRAL SPECTRAL FLOW PATTREN IS SUGGESTIVE OF IMPAIRED LV RELAXATION. RV SYSTOLIC PRESSURE IS 09FGHW. LEFT ATRIAL SIZE IS NORMAL. AV APPEARS MILDLY SCLEROTIC. NO SIGN VALVE DISEASE NOTED.  [name] LYMPHADENECTOMY Bilateral 08/30/2015   Procedure: PELVIC LYMPHADENECTOMY;   Surgeon: [name], MD;  Location: [facility];  Service: Urology;  Laterality: Bilateral;  . NUCLEAR STRESS TEST N/A 01-21-2012   NORMAL PATTERN OF PERFUSION IN ALL REGIONS. POST STRESS LV SIZE IS NORMAL. NO EVIDENCE OF INDUCIBLE ISCHEMIA. EF 54%.  [name] POLYPECTOMY    . PROSTATE BIOPSY    . ROBOT ASSISTED LAPAROSCOPIC RADICAL PROSTATECTOMY N/A 08/30/2015   Procedure: ROBOTIC ASSISTED LAPAROSCOPIC RADICAL PROSTATECTOMY;  Surgeon: [name], MD;  Location: [facility];  Service: Urology;  Laterality: N/A;  . US VENOUS LOWER EXT Right 03/07/11   PERSISTENT DVT IN RIGHT LOWER EXT.WITH PERSISTANT VISUALIZATION OF HYPOECHOIC THROMBUS WITHIN THE FEMORAL, PROFUNDA FEMORAL AND POPLITEAL VEINS. WHEN COMPARED TO PREVIOUS, CLOT IS NO LONGER IDENTIFIED WITH IN THE RIGHT CFV.   Social History   Social History Narrative  . No narrative on file  Objective: Vital Signs: BP 138/70   Pulse 78   Resp 16   Ht 5\' 9"  (1.753 m)   Wt 235 lb (106.6 kg)   BMI 34.70 kg/m    Physical Exam  Constitutional: He is oriented to person, place, and time. He appears well-developed and well-nourished.  HENT:  Head: Normocephalic and atraumatic.  Nasal congestion  Eyes: Pupils are equal, round, and reactive to light. Conjunctivae and EOM are normal.  Neck: Normal range of motion. Neck supple.  Cardiovascular: Normal rate, regular rhythm and normal heart sounds.   Pulmonary/Chest: Effort normal.  Decreased breath sounds in the left lung base.  Abdominal: Soft. Bowel sounds are normal.  Neurological: He is alert and oriented to person, place, and time.  Skin: Skin is warm and dry. Capillary refill takes less than 2 seconds.  Psychiatric: He has a normal mood and affect. His behavior is normal.  Nursing note and vitals reviewed.    Musculoskeletal Exam: C-spine good range of motion. He has thoracic kyphosis with tenderness over the lower thoracic region. Lumbar spine limited range of motion. His discomfort  range of motion of his left shoulder joint. Elbow joints wrist joints MCPs PIPs DIPs with good range of motion with no synovitis. He has PIP and DIP thickening. Hip joints, knee joints with good range of motion. He has bilateral lower extremity wrapped due to pedal edema.  CDAI Exam: CDAI Homunculus Exam:   Tenderness:  LUE: glenohumeral  Joint Counts:  CDAI Tender Joint count: 1 CDAI Swollen Joint count: 0  Global Assessments:  Patient Global Assessment: 8 Provider Global Assessment: 5  CDAI Calculated Score: 14    Investigation: Findings:  DEXA report shows TScore -2.6 Right Femoral neck BMD 0.571  Osteoporosis  CBC Latest Ref Rng & Units 09/25/2017 09/15/2017 07/21/2017  WBC 4.0 - 10.5 K/uL 6.4 10.7(H) 9.1  Hemoglobin 13.0 - 17.0 g/dL 14.5 14.9 14.1  Hematocrit 39.0 - 52.0 % 43.1 45.7 42.6  Platelets 150 - 400 K/uL 186 167 224   CMP Latest Ref Rng & Units 09/25/2017 09/15/2017 07/21/2017  Glucose 65 - 99 mg/dL 120(H) 111(H) 106(H)  BUN 6 - 20 mg/dL 16 13 13   Creatinine 0.61 - 1.24 mg/dL 1.32(H) 1.17 1.01  Sodium 135 - 145 mmol/L 140 141 141  Potassium 3.5 - 5.1 mmol/L 3.3(L) 3.2(L) 4.4  Chloride 101 - 111 mmol/L 98(L) 102 100(L)  CO2 22 - 32 mmol/L 32 31 29  Calcium 8.9 - 10.3 mg/dL 9.1 8.8(L) 8.5(L)  Total Protein 6.5 - 8.1 g/dL - 5.9(L) 5.9(L)  Total Bilirubin 0.3 - 1.2 mg/dL - 0.7 1.7(H)  Alkaline Phos 38 - 126 U/L - 71 99  AST 15 - 41 U/L - 30 42(H)  ALT 17 - 63 U/L - 32 26    Imaging: Ct Angio Chest Pe W And/or Wo Contrast  Result Date: 09/25/2017 CLINICAL DATA:  Chest tightness and shortness of breath. PE suspected, high pretest prob EXAM: CT ANGIOGRAPHY CHEST WITH CONTRAST TECHNIQUE: Multidetector CT imaging of the chest was performed using the standard protocol during bolus administration of intravenous contrast. Multiplanar CT image reconstructions and MIPs were obtained to evaluate the vascular anatomy. CONTRAST:  100 cc Isovue 370 IV COMPARISON:  Radiographs  07/05/2017, no interval exams. Chest CT 09/24/2012 FINDINGS: Cardiovascular: There are no filling defects within the pulmonary arteries to suggest pulmonary embolus. Subsegmental branches are suboptimally assessed due to contrast bolus timing. Tortuous thoracic aorta without dissection or aneurysm. Mild aortic atherosclerosis. There are  coronary artery calcifications. The heart is normal in size. Mediastinum/Nodes: No enlarged mediastinal, hilar, or axillary nodes. The esophagus is decompressed. Visualized thyroid gland is normal. Lungs/Pleura: Chronic elevation of left hemidiaphragm. Adjacent atelectasis in the lingula and left lower lobe. Mild dependent atelectasis in the right lung. No confluent consolidation. No pleural fluid. No evidence of pulmonary edema. The trachea and mainstem bronchi are patent. Upper Abdomen: Diverticulosis incidentally noted at the splenic flexure of the colon. No acute abnormality. Musculoskeletal: There are no acute or suspicious osseous abnormalities. Degenerative change in the spine. Review of the MIP images confirms the above findings. IMPRESSION: 1. No pulmonary embolus or acute intrathoracic abnormality. 2. Coronary artery calcifications. Mild aortic atherosclerosis without dissection. Aortic Atherosclerosis (ICD10-I70.0). 3. Incidental note of colonic diverticulosis in the included upper abdomen. Electronically Signed   By: Jeb Levering M.D.   On: 09/25/2017 21:42   Xr Thoracic Spine 2 View  Result Date: 09/11/2017 Multilevel spondylosis and mild kyphosis was noted. He also has some oral compression fractures from T5, T6, T7 ,T8, and T9 vertebrae. Impression: Mild is spondylosis with multiple stretch compression fractures.   Speciality Comments: No specialty comments available.    Procedures:  No procedures performed Allergies: Orencia [abatacept]; Carbamazepine; Celecoxib; Cephalexin; Doxycycline; Enbrel [etanercept]; Humira [adalimumab]; Levofloxacin; Sulfa  antibiotics; and Sulfasalazine   Assessment / Plan:     Visit Diagnoses: Rheumatoid arthritis involving multiple sites with positive rheumatoid factor [facility]): Patient complains of multiple arthralgias but he has no synovitis on examination today. He is a still on prednisone 10 mg a day. He is also on Actemra infusions and Arava. I would like to taper his prednisone by 1 mg a month. We had detailed discussion regarding that.  High risk medication use - Arava 20 mg po qd and Actemra IV (05/2017) Allergy to SULFASALAZINE, METHOTREXATE, and ORENCIA   inadequate response to ENBREL and HUMIRA, his labs have been stable. We will continue to monitor his labs every 3 months with infusions.  On prednisone therapy - Prednisone 10 mg po qd. I'm really concerned about his long-term prednisone. She's also developed vertebral fractures and osteoporosis. We had detailed discussion regarding that. I'll taper his prednisone by 1 mg every month. I'll give him 5 mg and 1 mg tablets to do that.  Primary osteoarthritis of both hands: Chronic discomfort  Primary osteoarthritis of both knees: Chronic pain  Primary osteoarthritis of both feet: Chronic pain  Idiopathic chronic gout of multiple sites without tophus - Allopurinol 200 mg by mouth daily. He has not had any gout flare  History of vertebral fracture: We discussed multiple thoracic fractures with him. I believe this due to chronic prednisone usage. I'll schedule MRI of his thoracic spine to look for any acute fracture as he is having significant thoracic pain.  Other osteoporosis with current pathological fracture with routine healing, subsequent encounter - 09/24/2017 DXA T score -2.60, BMD 0.571 right femoral neck. Different treatment options and their side effects were discussed at length. I will call him Fosamax 70 mg by mouth every week. Use of calcium and vitamin D was discussed as well.   Obesity: Weight loss diet and exercise was discussed at  length.   Personal history of DVT (deep vein thrombosis)  Hx of adenomatous colonic polyps  History of seizure disorder  History of prostate cancer  History of COPD: He continues to have shortness of breath and frequent ER visits. He believes his shortness of breath could be related to nasal congestion. He  has decreased breath sounds in his left lung base. His x-rays revealed that he had chronic elevation of left hemidiaphragm. I demonstrated chest PT to his wife. I will also refer him to pulmonary.  History of CHF (congestive heart failure)    Orders: Orders Placed This Encounter  Procedures  . MR THORACIC SPINE WO CONTRAST  . Ambulatory referral to Pulmonology   Meds ordered this encounter  Medications  . alendronate (FOSAMAX) 70 MG tablet    Sig: Take 1 tablet (70 mg total) by mouth once a week. Take with a full glass of water on an empty stomach.    Dispense:  12 tablet    Refill:  1  . diazepam (VALIUM) 5 MG tablet    Sig: take one / 1 hr prior to MRI repeat if needed    Dispense:  2 tablet    Refill:  0  . predniSONE (DELTASONE) 5 MG tablet    Sig: Take 1 tablet (5 mg total) by mouth daily with breakfast.    Dispense:  90 tablet    Refill:  0  . predniSONE (DELTASONE) 1 MG tablet    Sig: Take 4 tablets (4 mg total) by mouth daily with breakfast. Take with 5 mg tablet / 9 mg in Nov, 8mg  in Dec, 7mg  in Jan,taper by 1 mg per month    Dispense:  270 tablet    Refill:  1    Face-to-face time spent with patient was 35 minutes. 50% of time was spent in counseling and coordination of care.  Follow-Up Instructions: Return in about 3 months (around 12/30/2017) for Rheumatoid arthritis.   Bo Merino, MD  Note - This record has been created using Editor, commissioning.  Chart creation errors have been sought, but may not always  have been located. Such creation errors do not reflect on  the standard of medical care.

## 2017-09-24 ENCOUNTER — Encounter: Payer: Self-pay | Admitting: Rheumatology

## 2017-09-24 DIAGNOSIS — Z13820 Encounter for screening for osteoporosis: Secondary | ICD-10-CM | POA: Diagnosis not present

## 2017-09-25 ENCOUNTER — Emergency Department (HOSPITAL_COMMUNITY)
Admission: EM | Admit: 2017-09-25 | Discharge: 2017-09-26 | Disposition: A | Discharge status: Home / Self Care | Payer: Medicare Other | Attending: Emergency Medicine | Admitting: Emergency Medicine

## 2017-09-25 ENCOUNTER — Encounter (HOSPITAL_COMMUNITY): Payer: Self-pay | Admitting: *Deleted

## 2017-09-25 ENCOUNTER — Emergency Department (HOSPITAL_COMMUNITY): Payer: Medicare Other

## 2017-09-25 DIAGNOSIS — E876 Hypokalemia: Secondary | ICD-10-CM | POA: Diagnosis not present

## 2017-09-25 DIAGNOSIS — N2889 Other specified disorders of kidney and ureter: Secondary | ICD-10-CM | POA: Diagnosis not present

## 2017-09-25 DIAGNOSIS — Z87891 Personal history of nicotine dependence: Secondary | ICD-10-CM | POA: Diagnosis not present

## 2017-09-25 DIAGNOSIS — R0789 Other chest pain: Secondary | ICD-10-CM | POA: Diagnosis not present

## 2017-09-25 DIAGNOSIS — J441 Chronic obstructive pulmonary disease with (acute) exacerbation: Secondary | ICD-10-CM

## 2017-09-25 DIAGNOSIS — I509 Heart failure, unspecified: Secondary | ICD-10-CM | POA: Diagnosis not present

## 2017-09-25 DIAGNOSIS — Z7901 Long term (current) use of anticoagulants: Secondary | ICD-10-CM | POA: Diagnosis not present

## 2017-09-25 DIAGNOSIS — Z79899 Other long term (current) drug therapy: Secondary | ICD-10-CM | POA: Insufficient documentation

## 2017-09-25 DIAGNOSIS — J45909 Unspecified asthma, uncomplicated: Secondary | ICD-10-CM | POA: Insufficient documentation

## 2017-09-25 DIAGNOSIS — R079 Chest pain, unspecified: Secondary | ICD-10-CM | POA: Diagnosis present

## 2017-09-25 DIAGNOSIS — R0602 Shortness of breath: Secondary | ICD-10-CM | POA: Diagnosis not present

## 2017-09-25 DIAGNOSIS — Z8546 Personal history of malignant neoplasm of prostate: Secondary | ICD-10-CM | POA: Insufficient documentation

## 2017-09-25 LAB — CBC
HCT: 43.1 % (ref 39.0–52.0)
Hemoglobin: 14.5 g/dL (ref 13.0–17.0)
MCH: 30.3 pg (ref 26.0–34.0)
MCHC: 33.6 g/dL (ref 30.0–36.0)
MCV: 90.2 fL (ref 78.0–100.0)
Platelets: 186 10*3/uL (ref 150–400)
RBC: 4.78 MIL/uL (ref 4.22–5.81)
RDW: 14.2 % (ref 11.5–15.5)
WBC: 6.4 10*3/uL (ref 4.0–10.5)

## 2017-09-25 LAB — PROTIME-INR
INR: 2.91
Prothrombin Time: 30.2 seconds — ABNORMAL HIGH (ref 11.4–15.2)

## 2017-09-25 LAB — BASIC METABOLIC PANEL
Anion gap: 10 (ref 5–15)
BUN: 16 mg/dL (ref 6–20)
CO2: 32 mmol/L (ref 22–32)
Calcium: 9.1 mg/dL (ref 8.9–10.3)
Chloride: 98 mmol/L — ABNORMAL LOW (ref 101–111)
Creatinine, Ser: 1.32 mg/dL — ABNORMAL HIGH (ref 0.61–1.24)
GFR calc Af Amer: >60 mL/min (ref >60)
GFR calc non Af Amer: 55 mL/min — ABNORMAL LOW (ref >60)
Glucose, Bld: 120 mg/dL — ABNORMAL HIGH (ref 65–99)
Potassium: 3.3 mmol/L — ABNORMAL LOW (ref 3.5–5.1)
Sodium: 140 mmol/L (ref 135–145)

## 2017-09-25 LAB — TROPONIN I
Troponin I: <0.03 ng/mL (ref <0.03)
Troponin I: <0.03 ng/mL (ref <0.03)

## 2017-09-25 MED ORDER — LORAZEPAM 0.5 MG PO TABS
0.5000 mg | ORAL_TABLET | Freq: Once | ORAL | Status: AC
  Administered 2017-09-25: 0.5 mg via ORAL
  Filled 2017-09-25: qty 1

## 2017-09-25 MED ORDER — IOPAMIDOL (ISOVUE-370) INJECTION 76%
100.0000 mL | Freq: Once | INTRAVENOUS | Status: AC | PRN
  Administered 2017-09-25: 100 mL via INTRAVENOUS

## 2017-09-25 MED ORDER — POTASSIUM CHLORIDE CRYS ER 20 MEQ PO TBCR
40.0000 meq | EXTENDED_RELEASE_TABLET | Freq: Once | ORAL | Status: AC
  Administered 2017-09-25: 40 meq via ORAL
  Filled 2017-09-25: qty 2

## 2017-09-25 MED ORDER — ALBUTEROL SULFATE (2.5 MG/3ML) 0.083% IN NEBU
5.0000 mg | INHALATION_SOLUTION | Freq: Once | RESPIRATORY_TRACT | Status: AC
  Administered 2017-09-25: 5 mg via RESPIRATORY_TRACT
  Filled 2017-09-25: qty 6

## 2017-09-25 MED ORDER — PREDNISONE 20 MG PO TABS
40.0000 mg | ORAL_TABLET | Freq: Once | ORAL | Status: AC
  Administered 2017-09-25: 40 mg via ORAL
  Filled 2017-09-25: qty 2

## 2017-09-25 NOTE — ED Triage Notes (Addendum)
Pt reports sob and chest tightness x 2-3 days. Pt reports using his albuterol today with minimal relief.

## 2017-09-25 NOTE — ED Notes (Signed)
Patient transported to CT 

## 2017-09-25 NOTE — ED Notes (Signed)
ED Provider at bedside. 

## 2017-09-25 NOTE — ED Provider Notes (Signed)
[facility] DEPT Provider Note   CSN: [id number] Arrival date & time: 09/25/17  67     History   Chief Complaint Chief Complaint  Patient presents with  . Shortness of Breath  . Chest Pain    HPI [name] is a 66 y.o. male.complaint of shortness of breath upon awakening 8 30 a.m. Today. Dyspnea is worse with walking improved with remaining still. He also complained of anterior chest tightness this morning lasting approximately an hour. Chest tightness returned one or 2 minutes before I walked into the room to examine him. Nothing makes chest tightness better or worse. He denies cough denies fever no other associated symptoms.treated himself with albuterol nebulizer this morning with partial relief.he denies noncompliance with warfarin. Stating his last INR was 3.4 approximately 3 weeks ago. No other associated symptoms  HPI  Past Medical History:  Diagnosis Date  . Anxiety    hx of   . Arthritis   . Asthma   . Clotting disorder (HCC)    DVT both legs   . COPD (chronic obstructive pulmonary disease) ([place])   . DDD (degenerative disc disease), cervical    with UE's paresthesias  . Diverticulosis   . DVT, lower extremity ([place])    bilat  . 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 GERD (gastroesophageal reflux disease)   . Gout   . Heart murmur   . History of measles   . History of shingles   . Hypercholesterolemia   . IBS (irritable bowel syndrome)   . IBS (irritable bowel syndrome)   . Peripheral edema   . Pneumonia    hx of   . Prostate cancer ([place])   . Seizures ([place])    last seizure 20 years ago   . Shortness of breath dyspnea    exertion   . Sleep apnea    not on cpap  . Tubular adenoma of colon 04/2008    Patient Active Problem List   Diagnosis Date Noted  . History of seizure disorder 02/27/2017  . Primary osteoarthritis of both hands 02/27/2017  . Primary osteoarthritis of both feet  02/27/2017  . Idiopathic gout of multiple sites 02/27/2017  . High risk medication use 12/29/2016  . History of prostate cancer 12/29/2016  . Primary osteoarthritis of both knees 12/29/2016  . Chronic idiopathic gout involving toe without tophus 12/29/2016  . History of CHF (congestive heart failure) 12/29/2016  . History of COPD 12/29/2016  . Plantar pustular psoriasis 12/29/2016  . DVT (deep venous thrombosis) ([place]) 10/31/2016  . COPD (chronic obstructive pulmonary disease) ([place]) 10/31/2016  . CHF (congestive heart failure) ([place]) 10/31/2016  . Prostate cancer ([place]) 08/30/2015  . Malignant neoplasm of prostate ([place]) 07/04/2015  . Chest pain at rest 07/05/2014  . Weakness 07/05/2014  . Complicated postphlebitic syndrome 11/16/2013  . Personal history of DVT (deep vein thrombosis) 05/18/2013  . Chronic anticoagulation 05/18/2013  . Diverticulosis of colon without hemorrhage 05/18/2013  . Rheumatoid arthritis ([place]) 05/18/2013  . Seizure disorder ([place]) 05/18/2013  . Hx of adenomatous colonic polyps 05/18/2013  . GERD 05/08/2010  . NAUSEA 05/08/2010  . FLATULENCE-GAS-BLOATING 05/08/2010    Past Surgical History:  Procedure Laterality Date  . CHOLECYSTECTOMY    . COLONOSCOPY    . DOPPLER ECHOCARDIOGRAPHY N/A 01-21-2012   TECHNICALLY DIFFICULT. MILD CONCENTRIC LV HYPERTROPHY. LV CAVITY IS SMALL.. EF=> 55%. TRANSMITRAL SPECTRAL FLOW PATTREN IS SUGGESTIVE OF IMPAIRED  LV RELAXATION. RV SYSTOLIC PRESSURE IS 70YOVZ. LEFT ATRIAL SIZE IS NORMAL. AV APPEARS MILDLY SCLEROTIC. NO SIGN VALVE DISEASE NOTED.  [name] LYMPHADENECTOMY Bilateral 08/30/2015   Procedure: PELVIC LYMPHADENECTOMY;  Surgeon: [name], MD;  Location: [facility];  Service: Urology;  Laterality: Bilateral;  . NUCLEAR STRESS TEST N/A 01-21-2012   NORMAL PATTERN OF PERFUSION IN ALL REGIONS. POST STRESS LV SIZE IS NORMAL. NO EVIDENCE OF INDUCIBLE ISCHEMIA. EF 54%.  [name] POLYPECTOMY    . PROSTATE BIOPSY    . ROBOT ASSISTED  LAPAROSCOPIC RADICAL PROSTATECTOMY N/A 08/30/2015   Procedure: ROBOTIC ASSISTED LAPAROSCOPIC RADICAL PROSTATECTOMY;  Surgeon: [name], MD;  Location: [facility];  Service: Urology;  Laterality: N/A;  . US VENOUS LOWER EXT Right 03/07/11   PERSISTENT DVT IN RIGHT LOWER EXT.WITH PERSISTANT VISUALIZATION OF HYPOECHOIC THROMBUS WITHIN THE FEMORAL, PROFUNDA FEMORAL AND POPLITEAL VEINS. WHEN COMPARED TO PREVIOUS, CLOT IS NO LONGER IDENTIFIED WITH IN THE RIGHT CFV.       Home Medications    Prior to Admission medications   Medication Sig Start Date End Date Taking? Authorizing Provider  albuterol (PROVENTIL) (2.5 MG/3ML) 0.083% nebulizer solution Take 3 mLs (2.5 mg total) by nebulization every 6 (six) hours as needed for wheezing or shortness of breath. 07/05/17   Noemi Chapel, MD  albuterol (PROVENTIL) 2 MG tablet Take 2 mg by mouth 2 (two) times daily.    [provider]  allopurinol (ZYLOPRIM) 100 MG tablet TAKE 1 TABLET BY MOUTH TWICE A DAY 08/29/17   Deveshwar, Abel Presto, MD  dicyclomine (BENTYL) 10 MG capsule TAKE 1 CAPSULE BY MOUTH 4 TIMES DAILY BEFORE MEALS AND AT BEDTIME 08/11/17   Ladene Artist, MD  folic acid (FOLVITE) 1 MG tablet Take 2 mg by mouth every morning. 09/16/16   [provider]  furosemide (LASIX) 40 MG tablet TAKE 1 TABLET BY MOUTH TWO TIMES DAILY 06/09/17   Croitoru, Mihai, MD  leflunomide (ARAVA) 20 MG tablet TAKE 1 TABLET BY MOUTH EVERY DAY 09/09/17   Bo Merino, MD  mirabegron ER (MYRBETRIQ) 25 MG TB24 tablet Take 25 mg by mouth daily.    [provider]  omeprazole (PRILOSEC) 40 MG capsule Take 40 mg by mouth every evening.  09/23/16   [provider]  ondansetron (ZOFRAN) 4 MG tablet Take 1 tablet (4 mg total) by mouth every 6 (six) hours. Patient not taking: Reported on 09/11/2017 03/24/17   Nat Christen, MD  Oxymetazoline HCl (NASAL SPRAY NA) Place 2 sprays into the nose daily as needed (allergies).    [provider]    phenytoin (DILANTIN) 100 MG ER capsule Take 100-200 mg by mouth 2 (two) times daily. Take one capsule in the morning and 2 capsules at bedtime    [provider]  potassium chloride SA (K-DUR,KLOR-CON) 20 MEQ tablet Take 2 tablets (40 mEq total) by mouth daily. Patient not taking: Reported on 03/24/2017 01/18/17 01/25/17  Fatima Blank, MD  predniSONE (DELTASONE) 10 MG tablet Taper as instructed Decrease prednisone as follows 17.5mg  , 15 mg ,12.5 mg 10 mg , 5 mg , then 2.5mg  09/11/17   Bo Merino, MD  predniSONE (DELTASONE) 5 MG tablet Taper as instructed Decrease prednisone as follows 17.5mg  , 15 mg ,12.5 mg 10 mg , 5 mg , then 2.5mg  (every 2 weeks) 09/11/17   Bo Merino, MD  PROAIR HFA 108 (90 Base) MCG/ACT inhaler Inhale 1-2 puffs into the lungs every 6 (six) hours as needed.  03/17/17  [provider]  SYMBICORT 160-4.5 MCG/ACT inhaler Inhale 2 puffs into the lungs daily as needed.  10/07/16   [provider]  warfarin (COUMADIN) 3 MG tablet Take 3 mg by mouth at bedtime.     [provider]    Family History Family History  Problem Relation Age of Onset  . Skin cancer Father   . Stomach cancer Father   . Heart attack Father   . Alzheimer's disease Mother   . Throat cancer Brother   . Stomach cancer Brother   . Lung cancer Brother   . Heart attack Brother   . Heart attack Brother   . Breast cancer Sister   . Heart attack Sister   . Stroke Sister   . Bladder Cancer Sister   . Heart murmur Sister   . Colon cancer Neg Hx   . Colon polyps Neg Hx     Social History Social History  Substance Use Topics  . Smoking status: Former Smoker    Packs/day: 3.00    Years: 20.00    Types: Cigarettes    Quit date: 12/30/1989  . Smokeless tobacco: Never Used  . Alcohol use No     Allergies   Orencia [abatacept]; Carbamazepine; Celecoxib; Cephalexin; Doxycycline; Enbrel [etanercept]; Humira [adalimumab]; Levofloxacin; Sulfa  antibiotics; and Sulfasalazine   Review of Systems Review of Systems  Respiratory: Positive for chest tightness and shortness of breath.   Cardiovascular: Positive for leg swelling.       Chronic leg edema for the past several years  All other systems reviewed and are negative.    Physical Exam Updated Vital Signs BP (!) 179/92 (BP Location: Right Arm)   Pulse 87   Temp 97.8 F (36.6 C) (Oral)   Resp (!) 26   Ht 5\' 9"  (1.753 m)   Wt 106.1 kg (234 lb)   SpO2 95%   BMI 34.56 kg/m   Physical Exam  Constitutional:  Chronically ill-appearing  HENT:  Head: Normocephalic and atraumatic.  Eyes: Pupils are equal, round, and reactive to light. Conjunctivae are normal.  Neck: Neck supple. No tracheal deviation present. No thyromegaly present.  Cardiovascular: Normal rate and regular rhythm.   No murmur heard. Pulmonary/Chest:  Expiratory wheezes  Abdominal: Soft. Bowel sounds are normal. He exhibits no distension. There is no tenderness.  obese  Musculoskeletal: Normal range of motion. He exhibits edema. He exhibits no tenderness.  2+ pretibial pitting edema bilaterally  Neurological: He is alert. Coordination normal.  Skin: Skin is warm and dry. No rash noted.  Psychiatric: He has a normal mood and affect.  Nursing note and vitals reviewed.    ED Treatments / Results  Labs (all labs ordered are listed, but only abnormal results are displayed) Labs Reviewed  BASIC METABOLIC PANEL  CBC    EKG  EKG Interpretation  Date/Time:  Thursday September 25 2017 20:27:17 EDT Ventricular Rate:  87 PR Interval:    QRS Duration: 93 QT Interval:  369 QTC Calculation: 444 R Axis:   68 Text Interpretation:  Sinus rhythm Abnormal R-wave progression, early transition No significant change since last tracing Confirmed by Orlie Dakin [phone]) on 09/25/2017 9:35:18 PM       Radiology No results found.  Procedures Procedures (including critical care time)  Medications  Ordered in ED Medications  albuterol (PROVENTIL) (2.5 MG/3ML) 0.083% nebulizer solution 5 mg (not administered)    Results for orders placed or performed during the hospital encounter of 17/40/81  Basic metabolic panel  Result Value Ref Range   Sodium 140 135 - 145 mmol/L   Potassium 3.3 (L) 3.5 - 5.1 mmol/L   Chloride 98 (L) 101 - 111 mmol/L   CO2 32 22 - 32 mmol/L   Glucose, Bld 120 (H) 65 - 99 mg/dL   BUN 16 6 - 20 mg/dL   Creatinine, Ser 1.32 (H) 0.61 - 1.24 mg/dL   Calcium 9.1 8.9 - 10.3 mg/dL   GFR calc non Af Amer 55 (L) >60 mL/min   GFR calc Af Amer >60 >60 mL/min   Anion gap 10 5 - 15  CBC  Result Value Ref Range   WBC 6.4 4.0 - 10.5 K/uL   RBC 4.78 4.22 - 5.81 MIL/uL   Hemoglobin 14.5 13.0 - 17.0 g/dL   HCT 43.1 39.0 - 52.0 %   MCV 90.2 78.0 - 100.0 fL   MCH 30.3 26.0 - 34.0 pg   MCHC 33.6 30.0 - 36.0 g/dL   RDW 14.2 11.5 - 15.5 %   Platelets 186 150 - 400 K/uL  Protime-INR  Result Value Ref Range   Prothrombin Time 30.2 (H) 11.4 - 15.2 seconds   INR 2.91    Ct Angio Chest Pe W And/or Wo Contrast  Result Date: 09/25/2017 CLINICAL DATA:  Chest tightness and shortness of breath. PE suspected, high pretest prob EXAM: CT ANGIOGRAPHY CHEST WITH CONTRAST TECHNIQUE: Multidetector CT imaging of the chest was performed using the standard protocol during bolus administration of intravenous contrast. Multiplanar CT image reconstructions and MIPs were obtained to evaluate the vascular anatomy. CONTRAST:  100 cc Isovue 370 IV COMPARISON:  Radiographs 07/05/2017, no interval exams. Chest CT 09/24/2012 FINDINGS: Cardiovascular: There are no filling defects within the pulmonary arteries to suggest pulmonary embolus. Subsegmental branches are suboptimally assessed due to contrast bolus timing. Tortuous thoracic aorta without dissection or aneurysm. Mild aortic atherosclerosis. There are coronary artery calcifications. The heart is normal in size. Mediastinum/Nodes: No enlarged  mediastinal, hilar, or axillary nodes. The esophagus is decompressed. Visualized thyroid gland is normal. Lungs/Pleura: Chronic elevation of left hemidiaphragm. Adjacent atelectasis in the lingula and left lower lobe. Mild dependent atelectasis in the right lung. No confluent consolidation. No pleural fluid. No evidence of pulmonary edema. The trachea and mainstem bronchi are patent. Upper Abdomen: Diverticulosis incidentally noted at the splenic flexure of the colon. No acute abnormality. Musculoskeletal: There are no acute or suspicious osseous abnormalities. Degenerative change in the spine. Review of the MIP images confirms the above findings. IMPRESSION: 1. No pulmonary embolus or acute intrathoracic abnormality. 2. Coronary artery calcifications. Mild aortic atherosclerosis without dissection. Aortic Atherosclerosis (ICD10-I70.0). 3. Incidental note of colonic diverticulosis in the included upper abdomen. Electronically Signed   By: Jeb Levering M.D.   On: 09/25/2017 21:42   Xr Thoracic Spine 2 View  Result Date: 09/11/2017 Multilevel spondylosis and mild kyphosis was noted. He also has some oral compression fractures from T5, T6, T7 ,T8, and T9 vertebrae. Impression: Mild is spondylosis with multiple stretch compression fractures.  Initial Impression / Assessment and Plan / ED Course  I have reviewed the triage vital signs and the nursing notes.  Pertinent labs & imaging results that were available during my care of the patient were reviewed by me and considered in my medical decision making (see chart for details).     10:25 PM patient is breathing normally after treatment with albuterol nebulized treatment and oral prednisone INR is therapeutic.I ordered potassium chloride 40 mEq orally. Patient is signed out  toDr. Kohut10:30 PM Chest pain felt to be atypical.heart score equals 3 pulmonary embolism ruled out by CT angiogram Final Clinical Impressions(s) / ED Diagnoses  Diagnosis #1  exacerbation of COPD #2 atypical chest pain #3 hypokalemia #16mild renal insufficiency Final diagnoses:  None    New Prescriptions New Prescriptions   No medications on file     Orlie Dakin, MD 09/25/17 [id number]

## 2017-09-29 ENCOUNTER — Encounter: Payer: Self-pay | Admitting: Rheumatology

## 2017-09-29 ENCOUNTER — Ambulatory Visit (INDEPENDENT_AMBULATORY_CARE_PROVIDER_SITE_OTHER): Payer: Medicare Other | Admitting: Rheumatology

## 2017-09-29 VITALS — BP 138/70 | HR 78 | Resp 16 | Ht 69.0 in | Wt 235.0 lb

## 2017-09-29 DIAGNOSIS — Z86718 Personal history of other venous thrombosis and embolism: Secondary | ICD-10-CM | POA: Diagnosis not present

## 2017-09-29 DIAGNOSIS — M0579 Rheumatoid arthritis with rheumatoid factor of multiple sites without organ or systems involvement: Secondary | ICD-10-CM

## 2017-09-29 DIAGNOSIS — M19042 Primary osteoarthritis, left hand: Secondary | ICD-10-CM

## 2017-09-29 DIAGNOSIS — M19071 Primary osteoarthritis, right ankle and foot: Secondary | ICD-10-CM

## 2017-09-29 DIAGNOSIS — Z86010 Personal history of colonic polyps: Secondary | ICD-10-CM

## 2017-09-29 DIAGNOSIS — Z8781 Personal history of (healed) traumatic fracture: Secondary | ICD-10-CM

## 2017-09-29 DIAGNOSIS — M19072 Primary osteoarthritis, left ankle and foot: Secondary | ICD-10-CM

## 2017-09-29 DIAGNOSIS — M8080XD Other osteoporosis with current pathological fracture, unspecified site, subsequent encounter for fracture with routine healing: Secondary | ICD-10-CM | POA: Diagnosis not present

## 2017-09-29 DIAGNOSIS — M19041 Primary osteoarthritis, right hand: Secondary | ICD-10-CM | POA: Diagnosis not present

## 2017-09-29 DIAGNOSIS — Z8679 Personal history of other diseases of the circulatory system: Secondary | ICD-10-CM

## 2017-09-29 DIAGNOSIS — Z79899 Other long term (current) drug therapy: Secondary | ICD-10-CM

## 2017-09-29 DIAGNOSIS — Z8546 Personal history of malignant neoplasm of prostate: Secondary | ICD-10-CM

## 2017-09-29 DIAGNOSIS — Z8669 Personal history of other diseases of the nervous system and sense organs: Secondary | ICD-10-CM

## 2017-09-29 DIAGNOSIS — M170 Bilateral primary osteoarthritis of knee: Secondary | ICD-10-CM

## 2017-09-29 DIAGNOSIS — Z7952 Long term (current) use of systemic steroids: Secondary | ICD-10-CM

## 2017-09-29 DIAGNOSIS — M1A09X0 Idiopathic chronic gout, multiple sites, without tophus (tophi): Secondary | ICD-10-CM | POA: Diagnosis not present

## 2017-09-29 DIAGNOSIS — Z8709 Personal history of other diseases of the respiratory system: Secondary | ICD-10-CM

## 2017-09-29 MED ORDER — PREDNISONE 1 MG PO TABS: 4.0000 mg | ORAL_TABLET | Freq: Every day | ORAL | 1 refills | Status: DC

## 2017-09-29 MED ORDER — PREDNISONE 5 MG PO TABS: 5.0000 mg | ORAL_TABLET | Freq: Every day | ORAL | 0 refills | Status: DC

## 2017-09-29 MED ORDER — ALENDRONATE SODIUM 70 MG PO TABS: 70.0000 mg | ORAL_TABLET | ORAL | 1 refills | Status: DC

## 2017-09-29 MED ORDER — DIAZEPAM 5 MG PO TABS: ORAL_TABLET | ORAL | 0 refills | Status: DC

## 2017-09-29 NOTE — Patient Instructions (Signed)
Take 10 mg prednisone for October, reduce to 9mg  in November, 8 mg in December, continue to reduce by 1 mg per month   Alendronate tablets What is this medicine? ALENDRONATE (a LEN droe nate) slows calcium loss from bones. It helps to make normal healthy bone and to slow bone loss in people with Paget's disease and osteoporosis. It may be used in others at risk for bone loss. This medicine may be used for other purposes; ask your health care provider or pharmacist if you have questions. COMMON BRAND NAME(S): Fosamax What should I tell my health care provider before I take this medicine? They need to know if you have any of these conditions: -dental disease -esophagus, stomach, or intestine problems, like acid reflux or GERD -kidney disease -low blood calcium -low vitamin D -problems sitting or standing 30 minutes -trouble swallowing -an unusual or allergic reaction to alendronate, other medicines, foods, dyes, or preservatives -pregnant or trying to get pregnant -breast-feeding How should I use this medicine? You must take this medicine exactly as directed or you will lower the amount of the medicine you absorb into your body or you may cause yourself harm. Take this medicine by mouth first thing in the morning, after you are up for the day. Do not eat or drink anything before you take your medicine. Swallow the tablet with a full glass (6 to 8 fluid ounces) of plain water. Do not take this medicine with any other drink. Do not chew or crush the tablet. After taking this medicine, do not eat breakfast, drink, or take any medicines or vitamins for at least 30 minutes. Sit or stand up for at least 30 minutes after you take this medicine; do not lie down. Do not take your medicine more often than directed. Talk to your pediatrician regarding the use of this medicine in children. Special care may be needed. Overdosage: If you think you have taken too much of this medicine contact a poison control  center or emergency room at once. NOTE: This medicine is only for you. Do not share this medicine with others. What if I miss a dose? If you miss a dose, do not take it later in the day. Continue your normal schedule starting the next morning. Do not take double or extra doses. What may interact with this medicine? -aluminum hydroxide -antacids -aspirin -calcium supplements -drugs for inflammation like ibuprofen, naproxen, and others -iron supplements -magnesium supplements -vitamins with minerals This list may not describe all possible interactions. Give your health care provider a list of all the medicines, herbs, non-prescription drugs, or dietary supplements you use. Also tell them if you smoke, drink alcohol, or use illegal drugs. Some items may interact with your medicine. What should I watch for while using this medicine? Visit your doctor or health care professional for regular checks ups. It may be some time before you see benefit from this medicine. Do not stop taking your medicine except on your doctor's advice. Your doctor or health care professional may order blood tests and other tests to see how you are doing. You should make sure you get enough calcium and vitamin D while you are taking this medicine, unless your doctor tells you not to. Discuss the foods you eat and the vitamins you take with your health care professional. Some people who take this medicine have severe bone, joint, and/or muscle pain. This medicine may also increase your risk for a broken thigh bone. Tell your doctor right away if you  have pain in your upper leg or groin. Tell your doctor if you have any pain that does not go away or that gets worse. This medicine can make you more sensitive to the sun. If you get a rash while taking this medicine, sunlight may cause the rash to get worse. Keep out of the sun. If you cannot avoid being in the sun, wear protective clothing and use sunscreen. Do not use sun lamps or  tanning beds/booths. What side effects may I notice from receiving this medicine? Side effects that you should report to your doctor or health care professional as soon as possible: -allergic reactions like skin rash, itching or hives, swelling of the face, lips, or tongue -black or tarry stools -bone, muscle or joint pain -changes in vision -chest pain -heartburn or stomach pain -jaw pain, especially after dental work -pain or trouble when swallowing -redness, blistering, peeling or loosening of the skin, including inside the mouth Side effects that usually do not require medical attention (report to your doctor or health care professional if they continue or are bothersome): -changes in taste -diarrhea or constipation -eye pain or itching -headache -nausea or vomiting -stomach gas or fullness This list may not describe all possible side effects. Call your doctor for medical advice about side effects. You may report side effects to FDA at 1-800-FDA-1088. Where should I keep my medicine? Keep out of the reach of children. Store at room temperature of 15 and 30 degrees C (59 and 86 degrees F). Throw away any unused medicine after the expiration date. NOTE: This sheet is a summary. It may not cover all possible information. If you have questions about this medicine, talk to your doctor, pharmacist, or health care provider.  2018 Elsevier/Gold Standard (2011-06-14 08:56:09)

## 2017-10-03 ENCOUNTER — Ambulatory Visit (HOSPITAL_COMMUNITY)
Admission: RE | Admit: 2017-10-03 | Discharge: 2017-10-03 | Disposition: A | Discharge status: Home / Self Care | Payer: Medicare Other | Source: Ambulatory Visit | Attending: Rheumatology | Admitting: Rheumatology

## 2017-10-03 DIAGNOSIS — Z8781 Personal history of (healed) traumatic fracture: Secondary | ICD-10-CM | POA: Diagnosis not present

## 2017-10-03 DIAGNOSIS — Z7952 Long term (current) use of systemic steroids: Secondary | ICD-10-CM | POA: Diagnosis not present

## 2017-10-03 DIAGNOSIS — M549 Dorsalgia, unspecified: Secondary | ICD-10-CM | POA: Diagnosis not present

## 2017-10-04 NOTE — Progress Notes (Signed)
MRI thoracic spine - unremarkable. We can refer to PT if he is still symptomatic.

## 2017-10-07 ENCOUNTER — Other Ambulatory Visit: Payer: Self-pay | Admitting: *Deleted

## 2017-10-07 ENCOUNTER — Telehealth: Payer: Self-pay | Admitting: Rheumatology

## 2017-10-07 DIAGNOSIS — M546 Pain in thoracic spine: Secondary | ICD-10-CM

## 2017-10-07 NOTE — Telephone Encounter (Signed)
Patient in experiencing pain in his lower back, shoulder and hands and would like to know what he can do for relief.  He states he had an MRI and would like the results of that and he also would like to know about getting a back brace.

## 2017-10-07 NOTE — Telephone Encounter (Signed)
Patient advised we received results and the MRI is unremarkable. Patient advised the recommendation is PT. Patient verbalized understanding and the referral has been placed.

## 2017-10-07 NOTE — Progress Notes (Unsigned)
Infusion orders are current for patient CBC CMP Tylenol Benadryl appointments are up to date and follow up appointment  is scheduled TB gold not due yet, due on January, 2019.

## 2017-10-08 ENCOUNTER — Telehealth: Payer: Self-pay | Admitting: Rheumatology

## 2017-10-08 NOTE — Telephone Encounter (Signed)
Per patient [facility] in [place] does not have an [place] office. Patient wants to be referred to a facility in [place] if possible. Please cal lpatient to advise. Patient also wondering what is actually causing pain if not a fx. Please call to advise.

## 2017-10-09 ENCOUNTER — Telehealth: Payer: Self-pay | Admitting: Radiology

## 2017-10-09 NOTE — Telephone Encounter (Signed)
Left message for patient to call me back he has appt with Dr Chase Caller on October 24th at 1:30pm.

## 2017-10-10 ENCOUNTER — Telehealth: Payer: Self-pay

## 2017-10-10 NOTE — Telephone Encounter (Signed)
Patient was returning Amy L.call.  CB# is [phone].  Please advise.  Thank You

## 2017-10-10 NOTE — Telephone Encounter (Signed)
Patient advised of appointment date and time with Dr. Chase Caller. Patient also given address. Patient verbalized understanding.

## 2017-10-13 ENCOUNTER — Ambulatory Visit (HOSPITAL_COMMUNITY)
Admission: RE | Admit: 2017-10-13 | Discharge: 2017-10-13 | Disposition: A | Discharge status: Home / Self Care | Payer: Medicare Other | Source: Ambulatory Visit | Attending: Rheumatology | Admitting: Rheumatology

## 2017-10-13 DIAGNOSIS — Z79899 Other long term (current) drug therapy: Secondary | ICD-10-CM | POA: Insufficient documentation

## 2017-10-13 DIAGNOSIS — M0579 Rheumatoid arthritis with rheumatoid factor of multiple sites without organ or systems involvement: Secondary | ICD-10-CM

## 2017-10-13 MED ORDER — ACETAMINOPHEN 325 MG PO TABS: 650.0000 mg | ORAL_TABLET | ORAL | Status: DC

## 2017-10-13 MED ORDER — SODIUM CHLORIDE 0.9 % IV SOLN
4.0000 mg/kg | INTRAVENOUS | Status: DC
  Administered 2017-10-13: 418 mg via INTRAVENOUS
  Filled 2017-10-13: qty 20.9

## 2017-10-13 MED ORDER — DIPHENHYDRAMINE HCL 25 MG PO CAPS: 25.0000 mg | ORAL_CAPSULE | ORAL | Status: DC

## 2017-10-16 DIAGNOSIS — M549 Dorsalgia, unspecified: Secondary | ICD-10-CM | POA: Diagnosis not present

## 2017-10-16 DIAGNOSIS — M6281 Muscle weakness (generalized): Secondary | ICD-10-CM | POA: Diagnosis not present

## 2017-10-16 DIAGNOSIS — M799 Soft tissue disorder, unspecified: Secondary | ICD-10-CM | POA: Diagnosis not present

## 2017-10-16 DIAGNOSIS — M546 Pain in thoracic spine: Secondary | ICD-10-CM | POA: Diagnosis not present

## 2017-10-17 DIAGNOSIS — M6281 Muscle weakness (generalized): Secondary | ICD-10-CM | POA: Diagnosis not present

## 2017-10-17 DIAGNOSIS — M546 Pain in thoracic spine: Secondary | ICD-10-CM | POA: Diagnosis not present

## 2017-10-17 DIAGNOSIS — M549 Dorsalgia, unspecified: Secondary | ICD-10-CM | POA: Diagnosis not present

## 2017-10-17 DIAGNOSIS — M799 Soft tissue disorder, unspecified: Secondary | ICD-10-CM | POA: Diagnosis not present

## 2017-10-20 ENCOUNTER — Telehealth: Payer: Self-pay | Admitting: Rheumatology

## 2017-10-20 DIAGNOSIS — M6281 Muscle weakness (generalized): Secondary | ICD-10-CM | POA: Diagnosis not present

## 2017-10-20 DIAGNOSIS — M799 Soft tissue disorder, unspecified: Secondary | ICD-10-CM | POA: Diagnosis not present

## 2017-10-20 DIAGNOSIS — M069 Rheumatoid arthritis, unspecified: Secondary | ICD-10-CM | POA: Diagnosis not present

## 2017-10-20 DIAGNOSIS — Z6835 Body mass index (BMI) 35.0-35.9, adult: Secondary | ICD-10-CM | POA: Diagnosis not present

## 2017-10-20 DIAGNOSIS — Z7901 Long term (current) use of anticoagulants: Secondary | ICD-10-CM | POA: Diagnosis not present

## 2017-10-20 DIAGNOSIS — R7309 Other abnormal glucose: Secondary | ICD-10-CM | POA: Diagnosis not present

## 2017-10-20 DIAGNOSIS — J069 Acute upper respiratory infection, unspecified: Secondary | ICD-10-CM | POA: Diagnosis not present

## 2017-10-20 DIAGNOSIS — M546 Pain in thoracic spine: Secondary | ICD-10-CM | POA: Diagnosis not present

## 2017-10-20 DIAGNOSIS — J209 Acute bronchitis, unspecified: Secondary | ICD-10-CM | POA: Diagnosis not present

## 2017-10-20 DIAGNOSIS — J449 Chronic obstructive pulmonary disease, unspecified: Secondary | ICD-10-CM | POA: Diagnosis not present

## 2017-10-20 DIAGNOSIS — M549 Dorsalgia, unspecified: Secondary | ICD-10-CM | POA: Diagnosis not present

## 2017-10-20 NOTE — Telephone Encounter (Signed)
Physical therapy calling stating patient is having increased interior rib pain on same side he has been referred for. Intially got better, but now worsening. PT request a follow up if needed. Please call to advise patient.

## 2017-10-21 ENCOUNTER — Other Ambulatory Visit: Payer: Self-pay | Admitting: Gastroenterology

## 2017-10-21 NOTE — Telephone Encounter (Signed)
Left message to advise patient okay to wait to resume PT under after he is feeling better.

## 2017-10-21 NOTE — Telephone Encounter (Signed)
Ok to wait

## 2017-10-21 NOTE — Telephone Encounter (Signed)
Contacted patient and he states that he went to see the physical therapist and when she did her examination she push on his back and then there was "pop pop" and then the pain began. Patient states that his back is feeling better but the muscles between his ribs are what are causing the discomfort. Patient states it is getting better. Patient would like to know if he should continue therapy or give this time to heal before going back therapy. Please advise.

## 2017-10-22 ENCOUNTER — Ambulatory Visit (INDEPENDENT_AMBULATORY_CARE_PROVIDER_SITE_OTHER): Payer: Medicare Other | Admitting: Internal Medicine

## 2017-10-22 ENCOUNTER — Encounter: Payer: Self-pay | Admitting: Internal Medicine

## 2017-10-22 VITALS — BP 152/82 | HR 82 | Ht 69.0 in | Wt 231.6 lb

## 2017-10-22 DIAGNOSIS — Z8709 Personal history of other diseases of the respiratory system: Secondary | ICD-10-CM | POA: Diagnosis not present

## 2017-10-22 DIAGNOSIS — R0609 Other forms of dyspnea: Secondary | ICD-10-CM | POA: Insufficient documentation

## 2017-10-22 DIAGNOSIS — R0600 Dyspnea, unspecified: Secondary | ICD-10-CM | POA: Insufficient documentation

## 2017-10-22 MED ORDER — TIOTROPIUM BROMIDE MONOHYDRATE 18 MCG IN CAPS: 18.0000 ug | ORAL_CAPSULE | Freq: Every day | RESPIRATORY_TRACT | 0 refills | Status: DC

## 2017-10-22 NOTE — Addendum Note (Signed)
Addended by: Georjean Mode on: 10/22/2017 03:24 PM   Modules accepted: Orders

## 2017-10-22 NOTE — Progress Notes (Signed)
Subjective:    Patient ID: [name], male    DOB: [birth year], 66 y.o.   MRN: [id number]  PCP Mark Sites, MD   HPI  IOV 10/22/2017  Chief Complaint  Patient presents with  . Advice Only    Referred by Dr. Estanislado [name] with a history of COPD. Pt had a PFT done 02/26/2012 and states that that was when he was dx with moderate COPD. C/o SOB mainly on exertion but states that he has also been SOB at other times when not exerting, occ. dry cough. Denies any CP.   66 year old male. Prestns with wife.  Referred for dyspnea  With ? Of copd. REports quitting smoking many many years ago. In 2013, pparently had a PFT and was diagnosed with COPD. He and his wife [name] that he has a maintenance inhaler that he does not use.He uses an albuterol rescue inhaler occasionally. He is bothered by shortness of breath and mild amount of cough as documented below. Also 2013 he had rheumatoid arthritis and he's been on some immunosuppressive therapy. His workup does show impaired left diaphragm function and diastolic dysfunction on echo. He ruled out for pulmonary embolism recently. I personally visualized theCT chest. Walking desaturation test today he did not desaturate. He does admit to some wheezing as well.    CAT COPD Symptom & Quality of Life Score ([name] trademark) 0 is no burden. 5 is highest burden 10/22/2017   Never Cough -> Cough all the time 1  No phlegm in chest -> Chest is full of phlegm 0  No chest tightness -> Chest feels very tight 0  No dyspnea for 1 flight stairs/hill -> Very dyspneic for 1 flight of stairs 5  No limitations for ADL at home -> Very limited with ADL at home 3  Confident leaving home -> Not at all confident leaving home 5  Sleep soundly -> Do not sleep soundly because of lung condition 3  Lots of Energy -> No energy at all 3  TOTAL Score (max 40)  20    ECHO JAn 2013 LVH with impaired relaxation. EF 55% Nuclear medicine cardiac stress test July 2015: Normal CT  angiogram chest 09/25/2017: Normal lung fields no pulmonary embolism. Chronic elevation of the left hemidiaphragm personally visualized the CT chest Troponin 09/25/2017: Normal less than 0.03 Creatinine September 21 2017 1.3 mg percent Hemoglobin 14.5 g percent 09/25/2018 Walking desaturation test 185 feet 3 laps on room air: ppulse ox 81 HR/min -> 100/min. Pulse ox 99% -> 96%    has a past medical history of Anxiety; Arthritis; Asthma; Clotting disorder ([place]); COPD (chronic obstructive pulmonary disease) ([name]); DDD (degenerative disc disease), cervical; Diverticulosis; DVT, lower extremity ([name]); Eye abnormality; GERD (gastroesophageal reflux disease); Gout; Heart murmur; History of measles; History of shingles; Hypercholesterolemia; IBS (irritable bowel syndrome); IBS (irritable bowel syndrome); Peripheral edema; Pneumonia; Prostate cancer ([name]); Seizures ([name]); Shortness of breath dyspnea; Sleep apnea; and Tubular adenoma of colon (04/2008).   reports that he quit smoking about 27 years ago. His smoking use included Cigarettes. He has a 60.00 pack-year smoking history. He has never used smokeless tobacco.  Past Surgical History:  Procedure Laterality Date  . CHOLECYSTECTOMY    . COLONOSCOPY    . DOPPLER ECHOCARDIOGRAPHY N/A 01-21-2012   TECHNICALLY DIFFICULT. MILD CONCENTRIC LV HYPERTROPHY. LV CAVITY IS SMALL.. EF=> 55%. TRANSMITRAL SPECTRAL FLOW PATTREN IS SUGGESTIVE OF IMPAIRED LV RELAXATION. RV SYSTOLIC PRESSURE IS 09BDZH. LEFT ATRIAL SIZE IS NORMAL. AV APPEARS MILDLY SCLEROTIC.  NO SIGN VALVE DISEASE NOTED.  [name] LYMPHADENECTOMY Bilateral 08/30/2015   Procedure: PELVIC LYMPHADENECTOMY;  Surgeon: Mark Gustin, MD;  Location: [name];  Service: Urology;  Laterality: Bilateral;  . NUCLEAR STRESS TEST N/A 01-21-2012   NORMAL PATTERN OF PERFUSION IN ALL REGIONS. POST STRESS LV SIZE IS NORMAL. NO EVIDENCE OF INDUCIBLE ISCHEMIA. EF 54%.  [name] POLYPECTOMY    . PROSTATE BIOPSY    . ROBOT ASSISTED  LAPAROSCOPIC RADICAL PROSTATECTOMY N/A 08/30/2015   Procedure: ROBOTIC ASSISTED LAPAROSCOPIC RADICAL PROSTATECTOMY;  Surgeon: Mark Gustin, MD;  Location: [name];  Service: Urology;  Laterality: N/A;  . US VENOUS LOWER EXT Right 03/07/11   PERSISTENT DVT IN RIGHT LOWER EXT.WITH PERSISTANT VISUALIZATION OF HYPOECHOIC THROMBUS WITHIN THE FEMORAL, PROFUNDA FEMORAL AND POPLITEAL VEINS. WHEN COMPARED TO PREVIOUS, CLOT IS NO LONGER IDENTIFIED WITH IN THE RIGHT CFV.    Allergies  Allergen Reactions  . Orencia [Abatacept] Anaphylaxis    Had prostate cancer  . Carbamazepine Rash    REACTION: makes drowsy BRAND NAME IS TEGRETOL  . Celecoxib Itching and Rash    BRAND NAME IS CELEBREX  . Cephalexin Rash    "Severe Rash".  BRAND NAME IS KEFLEX.   [name] Doxycycline Other (See Comments)    GI upset  . Enbrel [Etanercept] Rash  . Humira [Adalimumab] Rash  . Levofloxacin Other (See Comments)    REACTION: GI Intolerance BRAND NAME IS LEVAQUIN  . Sulfa Antibiotics Rash  . Sulfasalazine Rash    Immunization History  Administered Date(s) Administered  . Influenza,inj,Quad PF,6+ Mos 08/31/2015  . Influenza-Unspecified 08/22/2017    Family History  Problem Relation Age of Onset  . Skin cancer Father   . Stomach cancer Father   . Heart attack Father   . Alzheimer's disease Mother   . Throat cancer Brother   . Stomach cancer Brother   . Lung cancer Brother   . Heart attack Brother   . Heart attack Brother   . Breast cancer Sister   . Heart attack Sister   . Stroke Sister   . Bladder Cancer Sister   . Heart murmur Sister   . Colon cancer Neg Hx   . Colon polyps Neg Hx      Current Outpatient Prescriptions:  .  albuterol (PROVENTIL) (2.5 MG/3ML) 0.083% nebulizer solution, Take 3 mLs (2.5 mg total) by nebulization every 6 (six) hours as needed for wheezing or shortness of breath., Disp: 75 mL, Rfl: 12 .  albuterol (PROVENTIL) 2 MG tablet, Take 2 mg by mouth 2 (two) times daily., Disp: ,  Rfl:  .  alendronate (FOSAMAX) 70 MG tablet, Take 1 tablet (70 mg total) by mouth once a week. Take with a full glass of water on an empty stomach., Disp: 12 tablet, Rfl: 1 .  allopurinol (ZYLOPRIM) 100 MG tablet, TAKE 1 TABLET BY MOUTH TWICE A DAY, Disp: 180 tablet, Rfl: 1 .  azithromycin (ZITHROMAX) 250 MG tablet, , Disp: , Rfl:  .  Calcium Carbonate (CALCIUM 600 PO), Take 2,000 mg by mouth daily., Disp: , Rfl:  .  Cholecalciferol 1000 units tablet, Take 1,000 Units by mouth daily., Disp: , Rfl:  .  dicyclomine (BENTYL) 10 MG capsule, TAKE 1 CAPSULE BY MOUTH 4 TIMES DAILY BEFORE MEALS AND AT BEDTIME (Patient taking differently: TAKE 2 CAPSULE BY MOUTH TWO TIMES DAILY), Disp: 120 capsule, Rfl: 0 .  folic acid (FOLVITE) 1 MG tablet, Take 2 mg by mouth every morning., Disp: , Rfl: 4 .  furosemide (LASIX) 40 MG tablet, TAKE 1 TABLET BY MOUTH TWO TIMES DAILY (Patient taking differently: TAKE 2 TABLETS BY MOUTH  DAILY), Disp: 60 tablet, Rfl: 8 .  leflunomide (ARAVA) 20 MG tablet, TAKE 1 TABLET BY MOUTH EVERY DAY, Disp: 30 tablet, Rfl: 2 .  omeprazole (PRILOSEC) 40 MG capsule, Take 40 mg by mouth 2 (two) times daily. , Disp: , Rfl: 2 .  Oxymetazoline HCl (NASAL SPRAY NA), Place 2 sprays into the nose daily as needed (allergies)., Disp: , Rfl:  .  phenytoin (DILANTIN) 100 MG ER capsule, Take 100-200 mg by mouth 2 (two) times daily. Take one capsule in the morning and 2 capsules at bedtime, Disp: , Rfl:  .  predniSONE (DELTASONE) 1 MG tablet, Take 4 tablets (4 mg total) by mouth daily with breakfast. Take with 5 mg tablet / 9 mg in Nov, 8mg  in Dec, 7mg  in [place] by 1 mg per month, Disp: 270 tablet, Rfl: 1 .  predniSONE (DELTASONE) 5 MG tablet, Take 1 tablet (5 mg total) by mouth daily with breakfast., Disp: 90 tablet, Rfl: 0 .  PROAIR HFA 108 (90 Base) MCG/ACT inhaler, Inhale 1-2 puffs into the lungs every 6 (six) hours as needed. , Disp: , Rfl:  .  warfarin (COUMADIN) 3 MG tablet, Take 3 mg by mouth at  bedtime. , Disp: , Rfl:  .  potassium chloride SA (K-DUR,KLOR-CON) 20 MEQ tablet, Take 2 tablets (40 mEq total) by mouth daily. (Patient not taking: Reported on 03/24/2017), Disp: 14 tablet, Rfl: 0    Review of Systems  Constitutional: Negative for fever and unexpected weight change.  HENT: Negative for congestion, dental problem, ear pain, nosebleeds, postnasal drip, rhinorrhea, sinus pressure, sneezing, sore throat and trouble swallowing.   Eyes: Negative for redness and itching.  Respiratory: Positive for cough and shortness of breath. Negative for chest tightness and wheezing.   Cardiovascular: Positive for leg swelling. Negative for palpitations.  Gastrointestinal: Negative for nausea and vomiting.  Genitourinary: Negative for dysuria.  Musculoskeletal: Positive for joint swelling.  Skin: Positive for rash.  Allergic/Immunologic: Negative.  Negative for environmental allergies, food allergies and immunocompromised state.  Neurological: Negative for headaches.  Hematological: Bruises/bleeds easily.  Psychiatric/Behavioral: Negative for dysphoric mood. The patient is not nervous/anxious.        Objective:   Physical Exam  Constitutional: He is oriented to person, place, and time. He appears well-developed and well-nourished. No distress.  HENT:  Head: Normocephalic and atraumatic.  Right Ear: External ear normal.  Left Ear: External ear normal.  Mouth/Throat: Oropharynx is clear and moist. No oropharyngeal exudate.  Eyes: Pupils are equal, round, and reactive to light. Conjunctivae and EOM are normal. Right eye exhibits no discharge. Left eye exhibits no discharge. No scleral icterus.  Neck: Normal range of motion. Neck supple. No JVD present. No tracheal deviation present. No thyromegaly present.  Cardiovascular: Normal rate, regular rhythm and intact distal pulses.  Exam reveals no gallop and no friction rub.   No murmur heard. Pulmonary/Chest: Effort normal and breath sounds  normal. No respiratory distress. He has no wheezes. He has no rales. He exhibits no tenderness.  Abdominal: Soft. Bowel sounds are normal. He exhibits no distension and no mass. There is no tenderness. There is no rebound and no guarding.  Visceral obesity +  Musculoskeletal: Normal range of motion. He exhibits no edema or tenderness.  swiollen hand joints and warm  Lymphadenopathy:    He has no cervical adenopathy.  Neurological: He is  alert and oriented to person, place, and time. He has normal reflexes. No cranial nerve deficit. Coordination normal.  Skin: Skin is warm and dry. No rash noted. He is not diaphoretic. No erythema. No pallor.  Psychiatric: He has a normal mood and affect. His behavior is normal. Judgment and thought content normal.  Nursing note and vitals reviewed.   Vitals:   10/22/17 1336  BP: (!) 152/82  Pulse: 82  SpO2: 95%  Weight: 231 lb 9.6 oz (105.1 kg)  Height: 5\' 9"  (1.753 m)    Body mass index is 34.2 kg/m.       Assessment & Plan:     ICD-10-CM   1. Dyspnea on exertion R06.09   2. History of COPD Z87.09      Shortness of breath is because of weight, frozen left diaphragm, diastolic dysfunction and history of COPD. Out of these can hopefully make frequent back by giving U an inhaler for COPD  PLAN - Please start spiriva respimat 1 puff daily - take samples , script  - do full pulmonary function test  Follow-up -= Return to see myself or my nurse practitioner in a few weeks review the above   Dr. Brand Males, M.D., [facility].C.P Pulmonary and Critical Care Medicine Staff Physician [facility] Pager: [phone], If no answer or between  15:00h - 7:00h: call [phone]  10/22/2017 2:07 PM

## 2017-10-22 NOTE — Patient Instructions (Signed)
ICD-10-CM   1. Dyspnea on exertion R06.09   2. History of COPD Z87.09    Shortness of breath is because of weight, frozen left diaphragm, diastolic dysfunction and history of COPD. Out of these can hopefully make frequent back by giving U an inhaler for COPD  PLAN - Please start spiriva respimat 1 puff daily - take samples , script  - do full pulmonary function test  Follow-up -= Return to see myself or my nurse practitioner in a few weeks review the above

## 2017-10-24 DIAGNOSIS — M799 Soft tissue disorder, unspecified: Secondary | ICD-10-CM | POA: Diagnosis not present

## 2017-10-24 DIAGNOSIS — M6281 Muscle weakness (generalized): Secondary | ICD-10-CM | POA: Diagnosis not present

## 2017-10-24 DIAGNOSIS — M549 Dorsalgia, unspecified: Secondary | ICD-10-CM | POA: Diagnosis not present

## 2017-10-24 DIAGNOSIS — M546 Pain in thoracic spine: Secondary | ICD-10-CM | POA: Diagnosis not present

## 2017-10-27 DIAGNOSIS — M546 Pain in thoracic spine: Secondary | ICD-10-CM | POA: Diagnosis not present

## 2017-10-27 DIAGNOSIS — M549 Dorsalgia, unspecified: Secondary | ICD-10-CM | POA: Diagnosis not present

## 2017-10-27 DIAGNOSIS — M6281 Muscle weakness (generalized): Secondary | ICD-10-CM | POA: Diagnosis not present

## 2017-10-27 DIAGNOSIS — M799 Soft tissue disorder, unspecified: Secondary | ICD-10-CM | POA: Diagnosis not present

## 2017-11-04 ENCOUNTER — Telehealth: Payer: Self-pay | Admitting: Rheumatology

## 2017-11-04 NOTE — Telephone Encounter (Signed)
Patient calling stating he is hurting all over, and it has been going on for a month. Increased pain today. Patient had infusion 3 1/2 weeks ago, and due for the next Monday. Please call to discuss.

## 2017-11-04 NOTE — Telephone Encounter (Signed)
Patient states he is having pain in both hands and wrists. Patient states he is also having pain in his hips which hurts when he lays on it. Patient states he is having pain in his knees, neck and mid back. Patient states he has taper to Prednisone 7 mg daily. Patient is currently on Arava and Actemra Infusion. Patient is due for his next infusion on Monday. Patient is also using heating pads to help with little relief. Patient states he has been hurting for a couple of weeks but it has gotten unbearable. Patient would like to know what he should do.

## 2017-11-04 NOTE — Telephone Encounter (Signed)
It seems that patient is having symptoms from trochanteric bursitis and osteoarthritis. These are not related to rheumatoid arthritis. If chronic pain comes and issue then we may have to refer him to pain clinic for prescription of tramadol. Currently Tylenol as an option.

## 2017-11-07 ENCOUNTER — Other Ambulatory Visit: Payer: Self-pay | Admitting: *Deleted

## 2017-11-07 NOTE — Progress Notes (Signed)
Infusion orders are current for patient CBC CMP Tylenol Benadryl appointments are up to date and follow up appointment  is scheduled TB skin test not due yet.

## 2017-11-10 ENCOUNTER — Ambulatory Visit (HOSPITAL_COMMUNITY)
Admission: RE | Admit: 2017-11-10 | Discharge: 2017-11-10 | Disposition: A | Discharge status: Home / Self Care | Payer: Medicare Other | Source: Ambulatory Visit | Attending: Rheumatology | Admitting: Rheumatology

## 2017-11-10 ENCOUNTER — Telehealth: Payer: Self-pay | Admitting: Rheumatology

## 2017-11-10 DIAGNOSIS — M0579 Rheumatoid arthritis with rheumatoid factor of multiple sites without organ or systems involvement: Secondary | ICD-10-CM

## 2017-11-10 DIAGNOSIS — Z79899 Other long term (current) drug therapy: Secondary | ICD-10-CM

## 2017-11-10 MED ORDER — TOCILIZUMAB 400 MG/20ML IV SOLN: 4.0000 mg/kg | INTRAVENOUS | Status: DC

## 2017-11-10 MED ORDER — DIPHENHYDRAMINE HCL 25 MG PO CAPS: 25.0000 mg | ORAL_CAPSULE | ORAL | Status: DC

## 2017-11-10 MED ORDER — ACETAMINOPHEN 325 MG PO TABS: 650.0000 mg | ORAL_TABLET | ORAL | Status: DC

## 2017-11-10 NOTE — Progress Notes (Signed)
Spoke with Seth Bake from Dr Arlean Hopping office and orders given to hold actemra today and reschedule when he is finished with antibiotic and well.  Pt verbalized understanding.

## 2017-11-10 NOTE — Telephone Encounter (Signed)
[facility] called this morning wanting to make sure it is okay to go ahead with his Actemra.  Patient is currently on antibiotics.  CB#[phone].  Thank you.

## 2017-11-10 NOTE — Telephone Encounter (Signed)
Lindaann Pascal that we should hold Acetmra until patient finishes antibiotics and is well.

## 2017-11-10 NOTE — Progress Notes (Signed)
Asked pt MCA questions and he denied antibiotic use. Then prior to starting IV pt stated he is on a Z pack that he started yesterday, 11/09/2017.  Pt denies fever and says he has a "nonproductive cough but has COPD anyway so  always has a cough".  Waiting on MD office to call me back.

## 2017-11-14 ENCOUNTER — Telehealth: Payer: Self-pay | Admitting: Rheumatology

## 2017-11-14 NOTE — Telephone Encounter (Signed)
Patient advised Dr. Estanislado Pandy is not in the office and will not return until 11-24-17. Patient advised to make an appointment with PCP or go to urgent care. Patient verbalized understanding.

## 2017-11-14 NOTE — Telephone Encounter (Signed)
Pain with hands, wrists, shoulders, knees, and lower back has increased over the last few days. Please call patient to advise.

## 2017-11-19 ENCOUNTER — Other Ambulatory Visit: Payer: Self-pay | Admitting: Gastroenterology

## 2017-11-26 ENCOUNTER — Ambulatory Visit (INDEPENDENT_AMBULATORY_CARE_PROVIDER_SITE_OTHER): Payer: Medicare Other | Admitting: Internal Medicine

## 2017-11-26 ENCOUNTER — Encounter: Payer: Self-pay | Admitting: Internal Medicine

## 2017-11-26 VITALS — BP 140/82 | HR 80 | Ht 69.0 in | Wt 232.0 lb

## 2017-11-26 DIAGNOSIS — J986 Disorders of diaphragm: Secondary | ICD-10-CM

## 2017-11-26 DIAGNOSIS — Z8709 Personal history of other diseases of the respiratory system: Secondary | ICD-10-CM

## 2017-11-26 DIAGNOSIS — E669 Obesity, unspecified: Secondary | ICD-10-CM

## 2017-11-26 DIAGNOSIS — R0609 Other forms of dyspnea: Secondary | ICD-10-CM

## 2017-11-26 DIAGNOSIS — R0600 Dyspnea, unspecified: Secondary | ICD-10-CM

## 2017-11-26 DIAGNOSIS — J439 Emphysema, unspecified: Secondary | ICD-10-CM | POA: Diagnosis not present

## 2017-11-26 DIAGNOSIS — I5189 Other ill-defined heart diseases: Secondary | ICD-10-CM

## 2017-11-26 DIAGNOSIS — I519 Heart disease, unspecified: Secondary | ICD-10-CM | POA: Diagnosis not present

## 2017-11-26 LAB — PULMONARY FUNCTION TEST
DL/VA % pred: 119 %
DL/VA: 5.42 ml/min/mmHg/L
DLCO cor % pred: 63 %
DLCO cor: 19.64 ml/min/mmHg
DLCO unc % pred: 63 %
DLCO unc: 19.58 ml/min/mmHg
FEF 25-75 Post: 1.87 L/sec
FEF 25-75 Pre: 1.99 L/sec
FEF2575-%Change-Post: -6 %
FEF2575-%Pred-Post: 73 %
FEF2575-%Pred-Pre: 78 %
FEV1-%Change-Post: 0 %
FEV1-%Pred-Post: 54 %
FEV1-%Pred-Pre: 54 %
FEV1-Post: 1.77 L
FEV1-Pre: 1.77 L
FEV1FVC-%Change-Post: 2 %
FEV1FVC-%Pred-Pre: 110 %
FEV6-%Change-Post: -1 %
FEV6-%Pred-Post: 50 %
FEV6-%Pred-Pre: 51 %
FEV6-Post: 2.11 L
FEV6-Pre: 2.13 L
FEV6FVC-%Pred-Post: 105 %
FEV6FVC-%Pred-Pre: 105 %
FVC-%Change-Post: -2 %
FVC-%Pred-Post: 48 %
FVC-%Pred-Pre: 49 %
FVC-Post: 2.11 L
FVC-Pre: 2.16 L
Post FEV1/FVC ratio: 84 %
Post FEV6/FVC ratio: 100 %
Pre FEV1/FVC ratio: 82 %
Pre FEV6/FVC Ratio: 100 %

## 2017-11-26 MED ORDER — FLUTICASONE FUROATE-VILANTEROL 100-25 MCG/INH IN AEPB
1.0000 | INHALATION_SPRAY | Freq: Every day | RESPIRATORY_TRACT | 0 refills | Status: DC
Start: 2017-11-26 — End: 2017-12-04

## 2017-11-26 NOTE — Progress Notes (Signed)
Patient seen in the office today and instructed on use of breo 100.  Patient expressed understanding and demonstrated technique.  

## 2017-11-26 NOTE — Patient Instructions (Addendum)
ICD-10-CM   1. Dyspnea on exertion R06.09   2. Pulmonary emphysema, unspecified emphysema type ([place]) J43.9   3. Obesity (BMI 30.0-34.9) E66.9   4. Diastolic dysfunction B55.9   5. Diaphragm paralysis J98.6    Shortness of breath is because of reasons #2, #3 and #4 and #5  Plan -Continue Spiriva daily -Add Brio and take the scheduled daily low-dose take samples -if this is helping you please call and we will send a prescription -start albuterol as needed -Referral to pulmonary rehabilitation at [place] weight -ideal body weight for for you is  170 pounds but even if you get to 185 pounds is pretty good  Follow-up 4-5 months or sooner if needed

## 2017-11-26 NOTE — Progress Notes (Signed)
Subjective:     Patient ID: [name], male   DOB: April 22, 1951, 66 y.o.   MRN: [id number]  HPI   PCP [name], MD   HPI  IOV 10/22/2017  Chief Complaint  Patient presents with  . Advice Only    Referred by Dr. Estanislado Pandy with a history of COPD. Pt had a PFT done 02/26/2012 and states that that was when he was dx with moderate COPD. C/o SOB mainly on exertion but states that he has also been SOB at other times when not exerting, occ. dry cough. Denies any CP.   66 year old male. Prestns with wife.  Referred for dyspnea  With ? Of copd. REports quitting smoking many many years ago. In 2013, pparently had a PFT and was diagnosed with COPD. He and his wife say that he has a maintenance inhaler that he does not use.He uses an albuterol rescue inhaler occasionally. He is bothered by shortness of breath and mild amount of cough as documented below. Also 2013 he had rheumatoid arthritis and he's been on some immunosuppressive therapy. His workup does show impaired left diaphragm function and diastolic dysfunction on echo. He ruled out for pulmonary embolism recently. I personally visualized theCT chest. Walking desaturation test today he did not desaturate. He does admit to some wheezing as well.       ECHO JAn 2013 LVH with impaired relaxation. EF 55% Nuclear medicine cardiac stress test July 2015: Normal CT angiogram chest 09/25/2017: Normal lung fields no pulmonary embolism. Chronic elevation of the left hemidiaphragm personally visualized the CT chest Troponin 09/25/2017: Normal less than 0.03 Creatinine September 21 2017 1.3 mg percent Hemoglobin 14.5 g percent 09/25/2018 Walking desaturation test 185 feet 3 laps on room air: ppulse ox 81 HR/min -> 100/min. Pulse ox 99% -> 96%   OV 11/26/2017  Chief Complaint  Patient presents with  . Follow-up    follow up from PFT, wheezing at times, sob    Followup : Shortness of breath is because of weight, frozen left diaphragm,  diastolic dysfunction and history of COPD.   Follow-up multifactorial dyspnea.  Dyspnea is due to the above reasons.  Last visit because of clinical history of COPD we will put him on Spiriva he says this is helped but only somewhat he still has residual dyspnea and some orthopnea particularly when he lies down which he thinks is because of his visceral obesity and diaphragmatic paralysis.  It is associated with wheezing.  Overall his pulmonary function test is a mixed obstructive restrictive pattern.  Although more towards restrictive.  He had pulmonary function test done today November 18, 2017: His FEV1 is 1.77 L / 54%, FVC 2.16 L or 49%, ratio of 82/110%.  Suggestive of restriction.  DLCO is 19.58/63% and reduced consistent with alveolar perfusion defect.  We do not have lung volumes.  There is no bronchodilator response.  He is asking about different options to address his dyspnea.      CAT COPD Symptom & Quality of Life Score (GSK trademark) 0 is no burden. 5 is highest burden 10/22/2017   Never Cough -> Cough all the time 1  No phlegm in chest -> Chest is full of phlegm 0  No chest tightness -> Chest feels very tight 0  No dyspnea for 1 flight stairs/hill -> Very dyspneic for 1 flight of stairs 5  No limitations for ADL at home -> Very limited with ADL at home 3  Confident leaving home -> Not  at all confident leaving home 5  Sleep soundly -> Do not sleep soundly because of lung condition 3  Lots of Energy -> No energy at all 3  TOTAL Score (max 40)  20     has a past medical history of Anxiety, Arthritis, Asthma, Clotting disorder ([place]), COPD (chronic obstructive pulmonary disease) ([place]), DDD (degenerative disc disease), cervical, Diverticulosis, DVT, lower extremity ([place]), Eye abnormality, GERD (gastroesophageal reflux disease), Gout, Heart murmur, History of measles, History of shingles, Hypercholesterolemia, IBS (irritable bowel syndrome), IBS (irritable bowel syndrome), Peripheral  edema, Pneumonia, Prostate cancer ([place]), Seizures ([place]), Shortness of breath dyspnea, Sleep apnea, and Tubular adenoma of colon (04/2008).   reports that he quit smoking about 27 years ago. His smoking use included cigarettes. He has a 60.00 pack-year smoking history. he has never used smokeless tobacco.  Past Surgical History:  Procedure Laterality Date  . CHOLECYSTECTOMY    . COLONOSCOPY    . DOPPLER ECHOCARDIOGRAPHY N/A 01-21-2012   TECHNICALLY DIFFICULT. MILD CONCENTRIC LV HYPERTROPHY. LV CAVITY IS SMALL.. EF=> 55%. TRANSMITRAL SPECTRAL FLOW PATTREN IS SUGGESTIVE OF IMPAIRED LV RELAXATION. RV SYSTOLIC PRESSURE IS 02VOZD. LEFT ATRIAL SIZE IS NORMAL. AV APPEARS MILDLY SCLEROTIC. NO SIGN VALVE DISEASE NOTED.  [name] LYMPHADENECTOMY Bilateral 08/30/2015   Procedure: PELVIC LYMPHADENECTOMY;  Surgeon: [name], MD;  Location: [facility];  Service: Urology;  Laterality: Bilateral;  . NUCLEAR STRESS TEST N/A 01-21-2012   NORMAL PATTERN OF PERFUSION IN ALL REGIONS. POST STRESS LV SIZE IS NORMAL. NO EVIDENCE OF INDUCIBLE ISCHEMIA. EF 54%.  [name] POLYPECTOMY    . PROSTATE BIOPSY    . ROBOT ASSISTED LAPAROSCOPIC RADICAL PROSTATECTOMY N/A 08/30/2015   Procedure: ROBOTIC ASSISTED LAPAROSCOPIC RADICAL PROSTATECTOMY;  Surgeon: [name], MD;  Location: [facility];  Service: Urology;  Laterality: N/A;  . US VENOUS LOWER EXT Right 03/07/11   PERSISTENT DVT IN RIGHT LOWER EXT.WITH PERSISTANT VISUALIZATION OF HYPOECHOIC THROMBUS WITHIN THE FEMORAL, PROFUNDA FEMORAL AND POPLITEAL VEINS. WHEN COMPARED TO PREVIOUS, CLOT IS NO LONGER IDENTIFIED WITH IN THE RIGHT CFV.    Allergies  Allergen Reactions  . Orencia [Abatacept] Anaphylaxis    Had prostate cancer  . Carbamazepine Rash    REACTION: makes drowsy BRAND NAME IS TEGRETOL  . Celecoxib Itching and Rash    BRAND NAME IS CELEBREX  . Cephalexin Rash    "Severe Rash".  BRAND NAME IS KEFLEX.   [name] Doxycycline Other (See Comments)    GI upset  . Enbrel  [Etanercept] Rash  . Humira [Adalimumab] Rash  . Levofloxacin Other (See Comments)    REACTION: GI Intolerance BRAND NAME IS LEVAQUIN  . Sulfa Antibiotics Rash  . Sulfasalazine Rash    Immunization History  Administered Date(s) Administered  . Influenza,inj,Quad PF,6+ Mos 08/31/2015  . Influenza-Unspecified 08/22/2017    Family History  Problem Relation Age of Onset  . Skin cancer Father   . Stomach cancer Father   . Heart attack Father   . Alzheimer's disease Mother   . Throat cancer Brother   . Stomach cancer Brother   . Lung cancer Brother   . Heart attack Brother   . Heart attack Brother   . Breast cancer Sister   . Heart attack Sister   . Stroke Sister   . Bladder Cancer Sister   . Heart murmur Sister   . Colon cancer Neg Hx   . Colon polyps Neg Hx      Current Outpatient Medications:  .  albuterol (PROVENTIL) (2.5 MG/3ML) 0.083%  nebulizer solution, Take 3 mLs (2.5 mg total) by nebulization every 6 (six) hours as needed for wheezing or shortness of breath., Disp: 75 mL, Rfl: 12 .  albuterol (PROVENTIL) 2 MG tablet, Take 2 mg by mouth 2 (two) times daily., Disp: , Rfl:  .  alendronate (FOSAMAX) 70 MG tablet, Take 1 tablet (70 mg total) by mouth once a week. Take with a full glass of water on an empty stomach., Disp: 12 tablet, Rfl: 1 .  allopurinol (ZYLOPRIM) 100 MG tablet, TAKE 1 TABLET BY MOUTH TWICE A DAY, Disp: 180 tablet, Rfl: 1 .  azithromycin (ZITHROMAX) 250 MG tablet, , Disp: , Rfl:  .  Calcium Carbonate (CALCIUM 600 PO), Take 2,000 mg by mouth daily., Disp: , Rfl:  .  Cholecalciferol 1000 units tablet, Take 1,000 Units by mouth daily., Disp: , Rfl:  .  dicyclomine (BENTYL) 10 MG capsule, TAKE 1 CAPSULE BY MOUTH 4 TIMES DAILY BEFORE MEALS AND AT BEDTIME (Patient taking differently: TAKE 2 CAPSULE BY MOUTH TWO TIMES DAILY), Disp: 120 capsule, Rfl: 0 .  folic acid (FOLVITE) 1 MG tablet, Take 2 mg by mouth every morning., Disp: , Rfl: 4 .  furosemide (LASIX) 40  MG tablet, TAKE 1 TABLET BY MOUTH TWO TIMES DAILY (Patient taking differently: TAKE 2 TABLETS BY MOUTH  DAILY), Disp: 60 tablet, Rfl: 8 .  leflunomide (ARAVA) 20 MG tablet, TAKE 1 TABLET BY MOUTH EVERY DAY, Disp: 30 tablet, Rfl: 2 .  omeprazole (PRILOSEC) 40 MG capsule, Take 40 mg by mouth 2 (two) times daily. , Disp: , Rfl: 2 .  Oxymetazoline HCl (NASAL SPRAY NA), Place 2 sprays into the nose daily as needed (allergies)., Disp: , Rfl:  .  phenytoin (DILANTIN) 100 MG ER capsule, Take 100-200 mg by mouth 2 (two) times daily. Take one capsule in the morning and 2 capsules at bedtime, Disp: , Rfl:  .  predniSONE (DELTASONE) 5 MG tablet, Take 1 tablet (5 mg total) by mouth daily with breakfast., Disp: 90 tablet, Rfl: 0 .  PROAIR HFA 108 (90 Base) MCG/ACT inhaler, Inhale 1-2 puffs into the lungs every 6 (six) hours as needed. , Disp: , Rfl:  .  tiotropium (SPIRIVA) 18 MCG inhalation capsule, Place 1 capsule (18 mcg total) into inhaler and inhale daily., Disp: 30 capsule, Rfl: 0 .  warfarin (COUMADIN) 3 MG tablet, Take 3 mg by mouth at bedtime. , Disp: , Rfl:  .  predniSONE (DELTASONE) 1 MG tablet, Take 4 tablets (4 mg total) by mouth daily with breakfast. Take with 5 mg tablet / 9 mg in Nov, 8mg  in Dec, 7mg  in [place] by 1 mg per month (Patient not taking: Reported on 11/26/2017), Disp: 270 tablet, Rfl: 1   Review of Systems     Objective:   Physical Exam  Constitutional: He is oriented to person, place, and time. He appears well-developed and well-nourished. No distress.  HENT:  Head: Normocephalic and atraumatic.  Right Ear: External ear normal.  Left Ear: External ear normal.  Mouth/Throat: Oropharynx is clear and moist. No oropharyngeal exudate.  Eyes: Conjunctivae and EOM are normal. Pupils are equal, round, and reactive to light. Right eye exhibits no discharge. Left eye exhibits no discharge. No scleral icterus.  Neck: Normal range of motion. Neck supple. No JVD present. No tracheal  deviation present. No thyromegaly present.  Cardiovascular: Normal rate, regular rhythm and intact distal pulses. Exam reveals no gallop and no friction rub.  No murmur heard. Pulmonary/Chest: Effort normal and  breath sounds normal. No respiratory distress. He has no wheezes. He has no rales. He exhibits no tenderness.  Abdominal: Soft. Bowel sounds are normal. He exhibits no distension and no mass. There is no tenderness. There is no rebound and no guarding.  Visible obesity present  Musculoskeletal: Normal range of motion. He exhibits no edema or tenderness.  Lymphadenopathy:    He has no cervical adenopathy.  Neurological: He is alert and oriented to person, place, and time. He has normal reflexes. No cranial nerve deficit. Coordination normal.  Skin: Skin is warm and dry. No rash noted. He is not diaphoretic. No erythema. No pallor.  Psychiatric: He has a normal mood and affect. His behavior is normal. Judgment and thought content normal.  Nursing note and vitals reviewed.   Vitals:   11/26/17 0953  BP: 140/82  Pulse: 80  SpO2: 92%  Weight: 232 lb (105.2 kg)  Height: 5\' 9"  (1.753 m)    Estimated body mass index is 34.26 kg/m as calculated from the following:   Height as of this encounter: 5\' 9"  (1.753 m).   Weight as of this encounter: 232 lb (105.2 kg).     Assessment:       ICD-10-CM   1. Dyspnea on exertion R06.09   2. Pulmonary emphysema, unspecified emphysema type ([place]) J43.9   3. Obesity (BMI 30.0-34.9) E66.9   4. Diastolic dysfunction W38.8   5. 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 Brand Males, M.D., [facility]C.P Pulmonary and Critical Care Medicine Staff Physician, [place] Director - Interstitial Lung Disease  Program  Pulmonary [facility] at [place], [place], [address]  Pager: [phone], If no answer or between  15:00h - 7:00h: call [phone] Telephone: [phone]

## 2017-11-26 NOTE — Progress Notes (Signed)
PFT done today. 

## 2017-11-27 ENCOUNTER — Telehealth: Payer: Self-pay | Admitting: Rheumatology

## 2017-11-27 DIAGNOSIS — C61 Malignant neoplasm of prostate: Secondary | ICD-10-CM | POA: Diagnosis not present

## 2017-11-27 NOTE — Telephone Encounter (Signed)
Patient was instructed to take Calcium, and Vit d. He is unsure of the doses. Please call to inform.

## 2017-12-01 ENCOUNTER — Telehealth: Payer: Self-pay | Admitting: Rheumatology

## 2017-12-01 ENCOUNTER — Other Ambulatory Visit: Payer: Self-pay | Admitting: *Deleted

## 2017-12-01 ENCOUNTER — Ambulatory Visit (HOSPITAL_COMMUNITY)
Admission: RE | Admit: 2017-12-01 | Discharge: 2017-12-01 | Disposition: A | Discharge status: Home / Self Care | Payer: Medicare Other | Source: Ambulatory Visit | Attending: Rheumatology | Admitting: Rheumatology

## 2017-12-01 DIAGNOSIS — M0579 Rheumatoid arthritis with rheumatoid factor of multiple sites without organ or systems involvement: Secondary | ICD-10-CM | POA: Diagnosis not present

## 2017-12-01 LAB — COMPREHENSIVE METABOLIC PANEL
ALT: 26 U/L (ref 17–63)
AST: 32 U/L (ref 15–41)
Albumin: 3.3 g/dL — ABNORMAL LOW (ref 3.5–5.0)
Alkaline Phosphatase: 94 U/L (ref 38–126)
Anion gap: 10 (ref 5–15)
BUN: 16 mg/dL (ref 6–20)
CO2: 31 mmol/L (ref 22–32)
Calcium: 8.7 mg/dL — ABNORMAL LOW (ref 8.9–10.3)
Chloride: 101 mmol/L (ref 101–111)
Creatinine, Ser: 1.04 mg/dL (ref 0.61–1.24)
GFR calc Af Amer: >60 mL/min (ref >60)
GFR calc non Af Amer: >60 mL/min (ref >60)
Glucose, Bld: 113 mg/dL — ABNORMAL HIGH (ref 65–99)
Potassium: 3.4 mmol/L — ABNORMAL LOW (ref 3.5–5.1)
Sodium: 142 mmol/L (ref 135–145)
Total Bilirubin: 0.6 mg/dL (ref 0.3–1.2)
Total Protein: 6.2 g/dL — ABNORMAL LOW (ref 6.5–8.1)

## 2017-12-01 LAB — CBC
HCT: 43.7 % (ref 39.0–52.0)
Hemoglobin: 14.3 g/dL (ref 13.0–17.0)
MCH: 29.7 pg (ref 26.0–34.0)
MCHC: 32.7 g/dL (ref 30.0–36.0)
MCV: 90.9 fL (ref 78.0–100.0)
Platelets: 259 10*3/uL (ref 150–400)
RBC: 4.81 MIL/uL (ref 4.22–5.81)
RDW: 14.1 % (ref 11.5–15.5)
WBC: 10.3 10*3/uL (ref 4.0–10.5)

## 2017-12-01 MED ORDER — ACETAMINOPHEN 325 MG PO TABS: 650.0000 mg | ORAL_TABLET | ORAL | Status: DC

## 2017-12-01 MED ORDER — TOCILIZUMAB 400 MG/20ML IV SOLN
4.0000 mg/kg | INTRAVENOUS | Status: DC
  Administered 2017-12-01: 420 mg via INTRAVENOUS
  Filled 2017-12-01: qty 21

## 2017-12-01 MED ORDER — DIPHENHYDRAMINE HCL 25 MG PO CAPS: 25.0000 mg | ORAL_CAPSULE | ORAL | Status: DC

## 2017-12-01 NOTE — Telephone Encounter (Signed)
Erasmo Downer from [facility] called this morning stating that the patient is coming in at 10a.m. But there are no orders in Epic.  He is coming in the for Actmera.  CB#[phone].  Thank you.

## 2017-12-01 NOTE — Progress Notes (Signed)
Please advise for him to take Citracal D

## 2017-12-01 NOTE — Telephone Encounter (Signed)
Patient advised of lab results and recommendations. Patient verbalized understanding. Copy faxed to PCP. Patient will start with Citracal D.

## 2017-12-01 NOTE — Telephone Encounter (Signed)
Infusion orders have been placed.  

## 2017-12-01 NOTE — Progress Notes (Signed)
Potassium is low. Please notify patient and fax result to his PCP. Rest the labs are stable.

## 2017-12-03 ENCOUNTER — Ambulatory Visit (INDEPENDENT_AMBULATORY_CARE_PROVIDER_SITE_OTHER): Payer: Medicare Other | Admitting: Urology

## 2017-12-03 ENCOUNTER — Other Ambulatory Visit: Payer: Self-pay | Admitting: Rheumatology

## 2017-12-03 DIAGNOSIS — C61 Malignant neoplasm of prostate: Secondary | ICD-10-CM

## 2017-12-03 DIAGNOSIS — R3915 Urgency of urination: Secondary | ICD-10-CM | POA: Diagnosis not present

## 2017-12-03 DIAGNOSIS — N5231 Erectile dysfunction following radical prostatectomy: Secondary | ICD-10-CM | POA: Diagnosis not present

## 2017-12-03 NOTE — Telephone Encounter (Signed)
Last Visit: 09/29/17 Next Visit: 02/25/18 Labs: 12/01/17 Potassium is low. Rest the labs are stable  Okay to refill per Dr. Estanislado Pandy.

## 2017-12-04 ENCOUNTER — Telehealth: Payer: Self-pay | Admitting: Internal Medicine

## 2017-12-04 MED ORDER — FLUTICASONE FUROATE-VILANTEROL 100-25 MCG/INH IN AEPB: 1.0000 | INHALATION_SPRAY | Freq: Every day | RESPIRATORY_TRACT | 4 refills | Status: DC

## 2017-12-04 MED ORDER — TIOTROPIUM BROMIDE MONOHYDRATE 18 MCG IN CAPS: 18.0000 ug | ORAL_CAPSULE | Freq: Every day | RESPIRATORY_TRACT | 4 refills | Status: DC

## 2017-12-04 NOTE — Telephone Encounter (Signed)
Rx for Spiriva and Memory Dance has been sent to preferred pharmacy. Lm to make pt aware.  Nothing further needed.

## 2017-12-05 ENCOUNTER — Telehealth: Payer: Self-pay

## 2017-12-05 ENCOUNTER — Other Ambulatory Visit: Payer: Self-pay | Admitting: Rheumatology

## 2017-12-05 NOTE — Telephone Encounter (Signed)
Spoke with patient. He states that he has been using Actemra infusions for about 6 months. He can tell a difference but he is still having flares in his hands and wrists. He is currently using 20mg  of prednisone. He is wondering how long it will take for the Actemra to work in his system and what he can do in the mean time for his hands and wrist. Please Advise. Thanks!  Seneca Gadbois, [place], CPhT 9:51 AM

## 2017-12-05 NOTE — Telephone Encounter (Signed)
Please reschedule a follow-up appointment

## 2017-12-05 NOTE — Telephone Encounter (Signed)
Last Visit: 09/29/17 Next Visit: 02/25/18  Okay to refill per Dr. Estanislado Pandy

## 2017-12-05 NOTE — Telephone Encounter (Signed)
Called the patient to verify benefits for 2019. Pt currently receives infusions that may require a pre-certification. Patient states that his insurance will remain the same for 2019. (medicare and [employer]).  Veryl Winemiller, [place], CPhT 9:55 AM

## 2017-12-05 NOTE — Telephone Encounter (Signed)
Attempted to contact the patient and left message for patient to call the office.  

## 2017-12-10 ENCOUNTER — Telehealth: Payer: Self-pay | Admitting: Internal Medicine

## 2017-12-10 NOTE — Telephone Encounter (Signed)
Attempted to call pt. Received busy signal x2. Will try back.

## 2017-12-11 MED ORDER — FLUTICASONE FUROATE-VILANTEROL 100-25 MCG/INH IN AEPB: 1.0000 | INHALATION_SPRAY | Freq: Every day | RESPIRATORY_TRACT | 4 refills | Status: DC

## 2017-12-11 MED ORDER — TIOTROPIUM BROMIDE MONOHYDRATE 18 MCG IN CAPS
18.0000 ug | ORAL_CAPSULE | Freq: Every day | RESPIRATORY_TRACT | 4 refills | Status: DC
Start: 2017-12-11 — End: 2017-12-26

## 2017-12-11 NOTE — Telephone Encounter (Signed)
Left detailed message for patient stating that I would call in medications for him.

## 2017-12-11 NOTE — Telephone Encounter (Signed)
Called pt, no answer and no voicemail. Will try again later.

## 2017-12-11 NOTE — Telephone Encounter (Signed)
Spoke with patient and he is willing to schedule a sooner appointment to discuss medications. Patient is only available on Mondays and Wednesdays.

## 2017-12-11 NOTE — Telephone Encounter (Signed)
Patient has been scheduled for 12/15/17 at 11:45 am.

## 2017-12-11 NOTE — Telephone Encounter (Signed)
Pt calling back from yesterday about his medication not being called in.

## 2017-12-11 NOTE — Progress Notes (Signed)
Office Visit Note  Patient: [name]             Date of Birth: [birth year]           MRN: [id number]             PCP: [name], MD Referring: [name], MD Visit Date: 12/15/2017 Occupation: @GUAROCC @    Subjective:  Multiple joints painful and swollen    History of Present Illness: [name] is a 66 y.o. male with history of rheumatoid arthritis, osteoporosis, osteoarthritis and gout.  Patient states he had to delay his Actemra IV infusion about 2 weeks due to recent URI that required antibiotics.  He states his last infusion was 12/01/17.  He reports increased joint pain, swelling, and stiffness.  He states he still has good grip strength.  He states he has pain in bilateral wrists, hands, hips, knees, and feet. He is still taking Arava 20 mg, Actemra IV, and Prednisone 15 mg po qd.  He is down to Prednisone 15 mg.  He would like to discuss other medication options.    He states he has had tingling in bilateral feet similar to when he has a gout flare.  He states he still takes Allopurinol 200 mg daily.  He feels his gout symptoms have actually worsened since making dietary restrictions.       Patient reports he is followed by pulmonology for his shortness of breath.  He is going to start pulmonary rehab January 01, 2018.     Activities of Daily Living:  Patient reports morning stiffness for  all day.   Patient Reports nocturnal pain.  Difficulty dressing/grooming: Denies Difficulty climbing stairs: Denies Difficulty getting out of chair: Denies Difficulty using hands for taps, buttons, cutlery, and/or writing: Reports   Review of Systems  Constitutional: Positive for fatigue. Negative for night sweats and weakness.  HENT: Negative.  Negative for mouth sores, mouth dryness and nose dryness.   Eyes: Positive for redness and dryness.  Respiratory: Negative.  Positive for difficulty breathing (Followed by pulm). Negative for cough, hemoptysis and shortness of  breath.   Cardiovascular: Negative.  Negative for chest pain, palpitations, hypertension, irregular heartbeat and swelling in legs/feet.  Gastrointestinal: Negative.  Negative for blood in stool, constipation and diarrhea.  Endocrine: Positive for increased urination.  Genitourinary: Negative for painful urination.  Musculoskeletal: Positive for arthralgias, joint pain, joint swelling and morning stiffness. Negative for myalgias, muscle weakness, muscle tenderness and myalgias.  Skin: Positive for nodules/bumps. Negative for color change, rash, hair loss, skin tightness, ulcers and sensitivity to sunlight.  Allergic/Immunologic: Negative for susceptible to infections.  Neurological: Negative for dizziness, fainting, memory loss and night sweats.  Hematological: Negative for swollen glands.  Psychiatric/Behavioral: Negative for depressed mood and sleep disturbance. The patient is not nervous/anxious.     PMFS History:  Patient Active Problem List   Diagnosis Date Noted  . Dyspnea on exertion 10/22/2017  . History of seizure disorder 02/27/2017  . Primary osteoarthritis of both hands 02/27/2017  . Primary osteoarthritis of both feet 02/27/2017  . Idiopathic gout of multiple sites 02/27/2017  . High risk medication use 12/29/2016  . History of prostate cancer 12/29/2016  . Primary osteoarthritis of both knees 12/29/2016  . Chronic idiopathic gout involving toe without tophus 12/29/2016  . History of CHF (congestive heart failure) 12/29/2016  . History of COPD 12/29/2016  . Plantar pustular psoriasis 12/29/2016  . DVT (deep venous thrombosis) ([place]) 10/31/2016  .  COPD (chronic obstructive pulmonary disease) ([place]) 10/31/2016  . CHF (congestive heart failure) ([place]) 10/31/2016  . Prostate cancer ([place]) 08/30/2015  . Malignant neoplasm of prostate ([place]) 07/04/2015  . Chest pain at rest 07/05/2014  . Weakness 07/05/2014  . Complicated postphlebitic syndrome 11/16/2013  . Personal history of  DVT (deep vein thrombosis) 05/18/2013  . Chronic anticoagulation 05/18/2013  . Diverticulosis of colon without hemorrhage 05/18/2013  . Rheumatoid arthritis ([place]) 05/18/2013  . Seizure disorder ([place]) 05/18/2013  . Hx of adenomatous colonic polyps 05/18/2013  . GERD 05/08/2010  . NAUSEA 05/08/2010  . FLATULENCE-GAS-BLOATING 05/08/2010    Past Medical History:  Diagnosis Date  . Anxiety    hx of   . Arthritis   . Asthma   . Clotting disorder (H[facility])    DVT both legs   . COPD (chronic obstructive pulmonary disease) ([place])   . DDD (degenerative disc disease), cervical    with UE's paresthesias  . Diverticulosis   . DVT, lower extremity ([place])    bilat  . 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 GERD (gastroesophageal reflux disease)   . Gout   . Heart murmur   . History of measles   . History of shingles   . Hypercholesterolemia   . IBS (irritable bowel syndrome)   . IBS (irritable bowel syndrome)   . Peripheral edema   . Pneumonia    hx of   . Prostate cancer ([place])   . Seizures ([place])    last seizure 20 years ago   . Shortness of breath dyspnea    exertion   . Sleep apnea    not on cpap  . Tubular adenoma of colon 04/2008    Family History  Problem Relation Age of Onset  . Skin cancer Father   . Stomach cancer Father   . Heart attack Father   . Alzheimer's disease Mother   . Throat cancer Brother   . Stomach cancer Brother   . Lung cancer Brother   . Heart attack Brother   . Heart attack Brother   . Breast cancer Sister   . Heart attack Sister   . Stroke Sister   . Bladder Cancer Sister   . Heart murmur Sister   . Colon cancer Neg Hx   . Colon polyps Neg Hx    Past Surgical History:  Procedure Laterality Date  . CHOLECYSTECTOMY    . COLONOSCOPY    . DOPPLER ECHOCARDIOGRAPHY N/A 01-21-2012   TECHNICALLY DIFFICULT. MILD CONCENTRIC LV HYPERTROPHY. LV CAVITY IS SMALL.. EF=> 55%. TRANSMITRAL SPECTRAL FLOW  PATTREN IS SUGGESTIVE OF IMPAIRED LV RELAXATION. RV SYSTOLIC PRESSURE IS 16XWRU. LEFT ATRIAL SIZE IS NORMAL. AV APPEARS MILDLY SCLEROTIC. NO SIGN VALVE DISEASE NOTED.  [name] LYMPHADENECTOMY Bilateral 08/30/2015   Procedure: PELVIC LYMPHADENECTOMY;  Surgeon: [name], MD;  Location: [facility];  Service: Urology;  Laterality: Bilateral;  . NUCLEAR STRESS TEST N/A 01-21-2012   NORMAL PATTERN OF PERFUSION IN ALL REGIONS. POST STRESS LV SIZE IS NORMAL. NO EVIDENCE OF INDUCIBLE ISCHEMIA. EF 54%.  [name] POLYPECTOMY    . PROSTATE BIOPSY    . ROBOT ASSISTED LAPAROSCOPIC RADICAL PROSTATECTOMY N/A 08/30/2015   Procedure: ROBOTIC ASSISTED LAPAROSCOPIC RADICAL PROSTATECTOMY;  Surgeon: [name], MD;  Location: [facility];  Service: Urology;  Laterality: N/A;  . US VENOUS LOWER EXT Right 03/07/11   PERSISTENT DVT IN RIGHT LOWER EXT.WITH PERSISTANT VISUALIZATION OF HYPOECHOIC  THROMBUS WITHIN THE FEMORAL, PROFUNDA FEMORAL AND POPLITEAL VEINS. WHEN COMPARED TO PREVIOUS, CLOT IS NO LONGER IDENTIFIED WITH IN THE RIGHT CFV.   Social History   Social History Narrative  . Not on file     Objective: Vital Signs: BP 135/79 (BP Location: Left Arm, Patient Position: Sitting, Cuff Size: Normal)   Pulse 79   Resp (!) 21   Ht 5\' 9"  (1.753 m)   Wt 235 lb (106.6 kg)   BMI 34.70 kg/m    Physical Exam  Constitutional: He is oriented to person, place, and time. He appears well-developed and well-nourished.  HENT:  Head: Normocephalic and atraumatic.  Eyes: Conjunctivae and EOM are normal. Pupils are equal, round, and reactive to light.  Neck: Normal range of motion. Neck supple.  Cardiovascular: Normal rate, regular rhythm and normal heart sounds.  Pulmonary/Chest: Effort normal and breath sounds normal.  Abdominal: Soft. Bowel sounds are normal.  Musculoskeletal: He exhibits edema.  Neurological: He is alert and oriented to person, place, and time.  Skin: Skin is warm and dry. Capillary refill takes less than 2  seconds.  Psychiatric: He has a normal mood and affect. His behavior is normal.  Nursing note and vitals reviewed.    Musculoskeletal Exam: C-spine limited ROM with discomfort.  Thoracic and lumbar spine good ROM.  Shoulder joints full ROM with some discomfort.  Elbow joints good ROM with no synovitis.  Wrist joints slightly limited ROM.  MCPs, PIPs, and DIPs  Hip joints, knee joints, and ankle joints good ROM.  Pedal edema.  MTPs, PIPs, and DIPs. Patient had no synovitis on examination.  CDAI Exam: CDAI Homunculus Exam:   Joint Counts:  CDAI Tender Joint count: 0 CDAI Swollen Joint count: 0  Global Assessments:  Patient Global Assessment: 8 Provider Global Assessment: 3  CDAI Calculated Score: 11    Investigation: No additional findings.PPD: 01/02/2017 Negative  CBC Latest Ref Rng & Units 12/01/2017 09/25/2017 09/15/2017  WBC 4.0 - 10.5 K/uL 10.3 6.4 10.7(H)  Hemoglobin 13.0 - 17.0 g/dL 14.3 14.5 14.9  Hematocrit 39.0 - 52.0 % 43.7 43.1 45.7  Platelets 150 - 400 K/uL 259 186 167   CMP Latest Ref Rng & Units 12/01/2017 09/25/2017 09/15/2017  Glucose 65 - 99 mg/dL 113(H) 120(H) 111(H)  BUN 6 - 20 mg/dL 16 16 13   Creatinine 0.61 - 1.24 mg/dL 1.04 1.32(H) 1.17  Sodium 135 - 145 mmol/L 142 140 141  Potassium 3.5 - 5.1 mmol/L 3.4(L) 3.3(L) 3.2(L)  Chloride 101 - 111 mmol/L 101 98(L) 102  CO2 22 - 32 mmol/L 31 32 31  Calcium 8.9 - 10.3 mg/dL 8.7(L) 9.1 8.8(L)  Total Protein 6.5 - 8.1 g/dL 6.2(L) - 5.9(L)  Total Bilirubin 0.3 - 1.2 mg/dL 0.6 - 0.7  Alkaline Phos 38 - 126 U/L 94 - 71  AST 15 - 41 U/L 32 - 30  ALT 17 - 63 U/L 26 - 32    Imaging: No results found.  Speciality Comments: ACTEMRA 4mg /kg x 4 weeks PPD negative 01/02/17    Procedures:  No procedures performed Allergies: Orencia [abatacept]; Carbamazepine; Celecoxib; Cephalexin; Doxycycline; Enbrel [etanercept]; Humira [adalimumab]; Levofloxacin; Sulfa antibiotics; and Sulfasalazine   Assessment / Plan:     Visit  Diagnoses: Rheumatoid arthritis involving multiple sites with positive rheumatoid factor [facility]): Patient complains of more joint pain and swelling. He had no synovitis on examination. He has been increasing his prednisone by himself from 5-15 mg a day. He states none of the medications have  been working for him. I had detailed discussion with patient and his wife that he had no synovitis and I would urge for him to decrease his prednisone gradually to 5 mg by mouth daily. He will need serum cortisol level to taper his prednisone further. As he's been taking long-term high-dose prednisone he probably has adrenal insufficiency by now. I believe most of his discomfort is coming from underlying osteoarthritis. I've also advised him to come in for evaluation when he develops joint swelling so we can visualize and examined him. I offered a referral to pain Or a prescription for tramadol. Patient declined narcotic medications. He cannot take NSAIDs as he's on Coumadin.  High risk medication use - Arava 20 mg po qd and Actemra IV (05/2017) Allergy to SULFASALAZINE, METHOTREXATE, and ORENCIA. Inadequate response to Enbrel and Humira. His labs are stable which are drawn with this IV Actemra infusions.  On prednisone therapy - developed vertebral fractures and osteoporosis. We had detailed discussion regarding that. I'll taper his prednisone by 1 mg every month. I'll give him 5 mg and  Primary osteoarthritis of both hands: He has osteoarthritis in his hands which causes chronic pain and discomfort.  Primary osteoarthritis of both knees: Chronic pain without any synovitis  Primary osteoarthritis of both feet: Chronic pain without synovitis  Idiopathic chronic gout of multiple sites without tophus - Allopurinol 200 mg by mouth daily. His gout appears to be well controlled.  Age-related osteoporosis without current pathological fracture - 09/24/2017 DXA T score -2.60, BMD 0.571 right femoral neck. He is on Fosamax 70  mg by mouth every week.  History of vertebral fracture  Other medical problems are listed as follows:  History of CHF (congestive heart failure)  History of obesity: Weight loss diet and exercise was discussed at length.  History of COPD  History of DVT (deep vein thrombosis)  History of prostate cancer  History of seizure disorder  History of adenomatous polyp of colon    Orders: No orders of the defined types were placed in this encounter.  No orders of the defined types were placed in this encounter.   Face-to-face time spent with patient was 30 minutes. Greater than 50% of time was spent in counseling and coordination of care.  Follow-Up Instructions: Return in about 3 months (around 03/15/2018) for Rheumatoid arthritis, Gout, Osteoporosis.   Note - This record has been created using [employer].  Chart creation errors have been sought, but may not always  have been located. Such creation errors do not reflect on  the standard of medical care.

## 2017-12-15 ENCOUNTER — Encounter: Payer: Self-pay | Admitting: Rheumatology

## 2017-12-15 ENCOUNTER — Ambulatory Visit (INDEPENDENT_AMBULATORY_CARE_PROVIDER_SITE_OTHER): Payer: Medicare Other | Admitting: Rheumatology

## 2017-12-15 VITALS — BP 135/79 | HR 79 | Resp 21 | Ht 69.0 in | Wt 235.0 lb

## 2017-12-15 DIAGNOSIS — Z8679 Personal history of other diseases of the circulatory system: Secondary | ICD-10-CM

## 2017-12-15 DIAGNOSIS — Z8709 Personal history of other diseases of the respiratory system: Secondary | ICD-10-CM | POA: Diagnosis not present

## 2017-12-15 DIAGNOSIS — M1A09X0 Idiopathic chronic gout, multiple sites, without tophus (tophi): Secondary | ICD-10-CM

## 2017-12-15 DIAGNOSIS — Z86010 Personal history of colonic polyps: Secondary | ICD-10-CM

## 2017-12-15 DIAGNOSIS — Z8639 Personal history of other endocrine, nutritional and metabolic disease: Secondary | ICD-10-CM

## 2017-12-15 DIAGNOSIS — M810 Age-related osteoporosis without current pathological fracture: Secondary | ICD-10-CM

## 2017-12-15 DIAGNOSIS — M0579 Rheumatoid arthritis with rheumatoid factor of multiple sites without organ or systems involvement: Secondary | ICD-10-CM | POA: Diagnosis not present

## 2017-12-15 DIAGNOSIS — M19071 Primary osteoarthritis, right ankle and foot: Secondary | ICD-10-CM

## 2017-12-15 DIAGNOSIS — M19041 Primary osteoarthritis, right hand: Secondary | ICD-10-CM

## 2017-12-15 DIAGNOSIS — Z8669 Personal history of other diseases of the nervous system and sense organs: Secondary | ICD-10-CM

## 2017-12-15 DIAGNOSIS — Z79899 Other long term (current) drug therapy: Secondary | ICD-10-CM

## 2017-12-15 DIAGNOSIS — Z8781 Personal history of (healed) traumatic fracture: Secondary | ICD-10-CM

## 2017-12-15 DIAGNOSIS — Z7952 Long term (current) use of systemic steroids: Secondary | ICD-10-CM | POA: Diagnosis not present

## 2017-12-15 DIAGNOSIS — Z86718 Personal history of other venous thrombosis and embolism: Secondary | ICD-10-CM

## 2017-12-15 DIAGNOSIS — M19042 Primary osteoarthritis, left hand: Secondary | ICD-10-CM

## 2017-12-15 DIAGNOSIS — M19072 Primary osteoarthritis, left ankle and foot: Secondary | ICD-10-CM

## 2017-12-15 DIAGNOSIS — Z860101 Personal history of adenomatous and serrated colon polyps: Secondary | ICD-10-CM

## 2017-12-15 DIAGNOSIS — Z8546 Personal history of malignant neoplasm of prostate: Secondary | ICD-10-CM

## 2017-12-15 DIAGNOSIS — M170 Bilateral primary osteoarthritis of knee: Secondary | ICD-10-CM | POA: Diagnosis not present

## 2017-12-17 ENCOUNTER — Telehealth (INDEPENDENT_AMBULATORY_CARE_PROVIDER_SITE_OTHER): Payer: Self-pay | Admitting: Radiology

## 2017-12-17 ENCOUNTER — Encounter (HOSPITAL_COMMUNITY): Payer: Medicare Other

## 2017-12-17 NOTE — Telephone Encounter (Signed)
Patient called, said he does not have enough prednisone 5mg  to take it the way Dr Estanislado Pandy wants, so he needs more.  Uses [facility] as his pharmacy. Please call him once Rx sent in.

## 2017-12-18 ENCOUNTER — Other Ambulatory Visit: Payer: Self-pay | Admitting: Rheumatology

## 2017-12-18 NOTE — Telephone Encounter (Signed)
Patient advised prescription has been sent to the pharmacy.  

## 2017-12-18 NOTE — Telephone Encounter (Signed)
Last Visit: 12/15/17 Next Visit: 03/18/18  Okay to refill 30 day supply per Dr. Estanislado Pandy. Patient to taper 1 mg a month.

## 2017-12-24 ENCOUNTER — Telehealth: Payer: Self-pay | Admitting: Internal Medicine

## 2017-12-24 NOTE — Telephone Encounter (Signed)
Spoke with pt, states his pharmacist told him that his spiriva is no longer covered by insurance.  I advised pt to continue taking his Breo as discussed.   Called pharmacy, states that spiriva is not covered, and that incruse is preferred.  PA request is being faxed to our office.  MR please advise if you'd like a PA on spiriva or if you'd like to switch to incruse.  Thanks.

## 2017-12-26 DIAGNOSIS — Z7901 Long term (current) use of anticoagulants: Secondary | ICD-10-CM | POA: Diagnosis not present

## 2017-12-26 MED ORDER — UMECLIDINIUM BROMIDE 62.5 MCG/INH IN AEPB: 1.0000 | INHALATION_SPRAY | Freq: Every day | RESPIRATORY_TRACT | 1 refills | Status: DC

## 2017-12-26 NOTE — Telephone Encounter (Signed)
Patient states he is out of Spiriva and has been waiting to hear what Dr. Chase Caller would like him to do.  Advised Dr. Chase Caller is on vacation this week and would have nurse call him.  CB is [phone].

## 2017-12-26 NOTE — Telephone Encounter (Signed)
Called spoke with patient and discussed MR's recs to change Spiriva to Incruse d/t insurance coverage Pt okay with this and voiced his understanding Pt is aware the Incruse device is identical to Breo's device Pt is aware MR recommends to take the Incruse in the morning and Breo in the evening/night  Rx sent to verified pharmacy - pt requests 3 month supply Spiriva removed from med list  Nothing further needed; will sign off

## 2017-12-26 NOTE — Telephone Encounter (Signed)
MR please advise if you would like to proceed with the PA for the spiriva or change to the incruse?  Thanks

## 2017-12-26 NOTE — Telephone Encounter (Signed)
Change to incruse daily; taking incruse in morning and breo in evening/night

## 2017-12-29 ENCOUNTER — Ambulatory Visit (HOSPITAL_COMMUNITY)
Admission: RE | Admit: 2017-12-29 | Discharge: 2017-12-29 | Disposition: A | Discharge status: Home / Self Care | Payer: Medicare Other | Source: Ambulatory Visit | Attending: Rheumatology | Admitting: Rheumatology

## 2017-12-29 DIAGNOSIS — M0579 Rheumatoid arthritis with rheumatoid factor of multiple sites without organ or systems involvement: Secondary | ICD-10-CM | POA: Diagnosis not present

## 2017-12-29 MED ORDER — TOCILIZUMAB 400 MG/20ML IV SOLN
4.0000 mg/kg | INTRAVENOUS | Status: DC
  Administered 2017-12-29: 420 mg via INTRAVENOUS
  Filled 2017-12-29: qty 21

## 2017-12-29 MED ORDER — ACETAMINOPHEN 325 MG PO TABS: 650.0000 mg | ORAL_TABLET | ORAL | Status: DC

## 2017-12-29 MED ORDER — DIPHENHYDRAMINE HCL 25 MG PO CAPS: 25.0000 mg | ORAL_CAPSULE | ORAL | Status: DC

## 2017-12-31 DIAGNOSIS — M1991 Primary osteoarthritis, unspecified site: Secondary | ICD-10-CM | POA: Diagnosis not present

## 2017-12-31 DIAGNOSIS — R569 Unspecified convulsions: Secondary | ICD-10-CM | POA: Diagnosis not present

## 2017-12-31 DIAGNOSIS — Z1389 Encounter for screening for other disorder: Secondary | ICD-10-CM | POA: Diagnosis not present

## 2017-12-31 DIAGNOSIS — R7309 Other abnormal glucose: Secondary | ICD-10-CM | POA: Diagnosis not present

## 2017-12-31 DIAGNOSIS — C61 Malignant neoplasm of prostate: Secondary | ICD-10-CM | POA: Diagnosis not present

## 2017-12-31 DIAGNOSIS — I82409 Acute embolism and thrombosis of unspecified deep veins of unspecified lower extremity: Secondary | ICD-10-CM | POA: Diagnosis not present

## 2017-12-31 DIAGNOSIS — K589 Irritable bowel syndrome without diarrhea: Secondary | ICD-10-CM | POA: Diagnosis not present

## 2017-12-31 DIAGNOSIS — J329 Chronic sinusitis, unspecified: Secondary | ICD-10-CM | POA: Diagnosis not present

## 2017-12-31 DIAGNOSIS — M1A00X0 Idiopathic chronic gout, unspecified site, without tophus (tophi): Secondary | ICD-10-CM | POA: Diagnosis not present

## 2017-12-31 DIAGNOSIS — M069 Rheumatoid arthritis, unspecified: Secondary | ICD-10-CM | POA: Diagnosis not present

## 2017-12-31 DIAGNOSIS — Z6834 Body mass index (BMI) 34.0-34.9, adult: Secondary | ICD-10-CM | POA: Diagnosis not present

## 2018-01-01 ENCOUNTER — Encounter (HOSPITAL_COMMUNITY)
Admission: RE | Admit: 2018-01-01 | Discharge: 2018-01-01 | Disposition: A | Discharge status: Home / Self Care | Payer: Medicare Other | Source: Ambulatory Visit | Attending: Internal Medicine | Admitting: Internal Medicine

## 2018-01-01 VITALS — BP 136/68 | HR 84 | Ht 69.0 in | Wt 227.6 lb

## 2018-01-01 DIAGNOSIS — R0609 Other forms of dyspnea: Secondary | ICD-10-CM

## 2018-01-01 DIAGNOSIS — R0600 Dyspnea, unspecified: Secondary | ICD-10-CM | POA: Insufficient documentation

## 2018-01-01 NOTE — Progress Notes (Signed)
Pulmonary Individual Treatment Plan  Patient Details  Name: [name] MRN: [id number] Date of Birth: 06/01/1951 Referring Provider:     PULMONARY REHAB OTHER RESP ORIENTATION from 01/01/2018 in [place]  Referring Provider  Dr. Chase Caller      Initial Encounter Date:    [place] from 01/01/2018 in [place]  Date  01/01/18  Referring Provider  Dr. Chase Caller      Visit Diagnosis: Dyspnea on exertion  Patient's Home Medications on Admission:   Current Outpatient Medications:  .  acetaminophen (TYLENOL) 500 MG tablet, Take 1,000 mg by mouth every 6 (six) hours as needed (arthritis pain)., Disp: , Rfl:  .  albuterol (PROVENTIL) (2.5 MG/3ML) 0.083% nebulizer solution, Take 3 mLs (2.5 mg total) by nebulization every 6 (six) hours as needed for wheezing or shortness of breath., Disp: 75 mL, Rfl: 12 .  albuterol (PROVENTIL) 2 MG tablet, Take 2 mg by mouth 2 (two) times daily., Disp: , Rfl:  .  alendronate (FOSAMAX) 70 MG tablet, Take 1 tablet (70 mg total) by mouth once a week. Take with a full glass of water on an empty stomach., Disp: 12 tablet, Rfl: 1 .  allopurinol (ZYLOPRIM) 100 MG tablet, TAKE 1 TABLET BY MOUTH TWICE A DAY, Disp: 180 tablet, Rfl: 1 .  Calcium-Phosphorus-Vitamin D (CITRACAL +D3 PO), Take 2 tablets by mouth 2 (two) times daily. , Disp: , Rfl:  .  dicyclomine (BENTYL) 10 MG capsule, TAKE 1 CAPSULE BY MOUTH 4 TIMES DAILY BEFORE MEALS AND AT BEDTIME (Patient taking differently: TAKE 2 CAPSULE BY MOUTH TWO TIMES DAILY), Disp: 120 capsule, Rfl: 0 .  doxycycline (VIBRA-TABS) 100 MG tablet, Take 100 mg by mouth 2 (two) times daily. For ten days.  Started 12/31/2017., Disp: , Rfl:  .  fluticasone furoate-vilanterol (BREO ELLIPTA) 100-25 MCG/INH AEPB, Inhale 1 puff into the lungs daily., Disp: 1 each, Rfl: 4 .  folic acid (FOLVITE) 1 MG tablet, TAKE 2 TABLETS BY MOUTH EVERY MORNING, Disp: 180 tablet,  Rfl: 4 .  furosemide (LASIX) 40 MG tablet, TAKE 1 TABLET BY MOUTH TWO TIMES DAILY (Patient taking differently: TAKE 2 TABLETS BY MOUTH (Evenings)), Disp: 60 tablet, Rfl: 8 .  leflunomide (ARAVA) 20 MG tablet, TAKE 1 TABLET BY MOUTH EVERY DAY, Disp: 30 tablet, Rfl: 2 .  omeprazole (PRILOSEC) 40 MG capsule, Take 40 mg by mouth 2 (two) times daily. , Disp: , Rfl: 2 .  Oxymetazoline HCl (NASAL SPRAY NA), Place 2 sprays into the nose daily as needed (allergies)., Disp: , Rfl:  .  phenytoin (DILANTIN) 100 MG ER capsule, Take 100-200 mg by mouth 2 (two) times daily. Take one capsule in the morning and 2 capsules at bedtime, Disp: , Rfl:  .  predniSONE (DELTASONE) 1 MG tablet, Take 4 tablets (4 mg total) by mouth daily with breakfast. Take with 5 mg tablet / 9 mg in Nov, 8mg  in Dec, 7mg  in [place] by 1 mg per month, Disp: 270 tablet, Rfl: 1 .  PROAIR HFA 108 (90 Base) MCG/ACT inhaler, Inhale 1-2 puffs into the lungs every 6 (six) hours as needed. , Disp: , Rfl:  .  Tocilizumab (ACTEMRA IV), Inject into the vein every 28 (twenty-eight) days. For Rheumatoid Arthritis, Disp: , Rfl:  .  umeclidinium bromide (INCRUSE ELLIPTA) 62.5 MCG/INH AEPB, Inhale 1 puff into the lungs daily., Disp: 3 each, Rfl: 1 .  warfarin (COUMADIN) 3 MG tablet, Take 3 mg by mouth at  bedtime. , Disp: , Rfl:   Past Medical History: Past Medical History:  Diagnosis Date  . Anxiety    hx of   . Arthritis   . Asthma   . Clotting disorder (HCC)    DVT both legs   . COPD (chronic obstructive pulmonary disease) ([place])   . DDD (degenerative disc disease), cervical    with UE's paresthesias  . Diverticulosis   . DVT, lower extremity ([place])    bilat  . 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 GERD (gastroesophageal reflux disease)   . Gout   . Heart murmur   . History of measles   . History of shingles   . Hypercholesterolemia   . IBS (irritable bowel syndrome)   . IBS  (irritable bowel syndrome)   . Peripheral edema   . Pneumonia    hx of   . Prostate cancer ([place])   . Seizures ([place])    last seizure 20 years ago   . Shortness of breath dyspnea    exertion   . Sleep apnea    not on cpap  . Tubular adenoma of colon 04/2008    Tobacco Use: Social History   Tobacco Use  Smoking Status Former Smoker  . Packs/day: 3.00  . Years: 20.00  . Pack years: 60.00  . Types: Cigarettes  . Last attempt to quit: 12/30/1989  . Years since quitting: 28.0  Smokeless Tobacco Never Used    Labs: Recent Review Scientist, physiological    Labs for ITP Cardiac and Pulmonary Rehab Latest Ref Rng & Units 07/01/2010   TCO2 0 - 100 mmol/L 27      Capillary Blood Glucose: Lab Results  Component Value Date   GLUCAP 101 (H) 11/28/2008     Pulmonary Assessment Scores: Pulmonary Assessment Scores    Row Name 01/01/18 1017         ADL UCSD   ADL Phase  Entry     SOB Score total  18     Rest  0     Walk  2     Stairs  2     Bath  1     Dress  1     Shop  1       CAT Score   CAT Score  10       mMRC Score   mMRC Score  2        Pulmonary Function Assessment: Pulmonary Function Assessment - 01/01/18 1013      Pulmonary Function Tests   FVC%  49 %    FEV1%  54 %    FEV1/FVC Ratio  110    DLCO%  63 %      Initial Spirometry Results   FVC%  49 %    FEV1%  54 %    FEV1/FVC Ratio  110      Post Bronchodilator Spirometry Results   FVC%  48 %    FEV1%  54 %    FEV1/FVC Ratio  113      Breath   Bilateral Breath Sounds  Clear    Shortness of Breath  No       Exercise Target Goals: Date: 01/01/18  Exercise Program Goal: Individual exercise prescription set with THRR, safety & activity barriers. Participant demonstrates ability to understand and report RPE using BORG scale, to self-measure pulse accurately, and to acknowledge the importance of  the exercise prescription.  Exercise Prescription Goal: Starting with aerobic activity 30 plus minutes a  day, 3 days per week for initial exercise prescription. Provide home exercise prescription and guidelines that participant acknowledges understanding prior to discharge.  Activity Barriers & Risk Stratification: Activity Barriers & Cardiac Risk Stratification - 01/01/18 0936      Activity Barriers & Cardiac Risk Stratification   Activity Barriers  Back Problems;Deconditioning    Cardiac Risk Stratification  Low       6 Minute Walk: 6 Minute Walk    Row Name 01/01/18 0932         6 Minute Walk   Phase  Initial     Distance  1200 feet     Distance % Change  0 %     Distance Feet Change  0 ft     Walk Time  6 minutes     # of Rest Breaks  0     MPH  2.27     METS  2.74     RPE  9     Perceived Dyspnea   9     VO2 Peak  9.54     Symptoms  No     Resting HR  84 bpm     Resting BP  136/67     Resting Oxygen Saturation   94 %     Exercise Oxygen Saturation  during 6 min walk  91 %     Max Ex. HR  105 bpm     Max Ex. BP  146/74     2 Minute Post BP  136/66        Oxygen Initial Assessment: Oxygen Initial Assessment - 01/01/18 1023      Home Oxygen   Home Oxygen Device  None    Sleep Oxygen Prescription  None    Home Exercise Oxygen Prescription  None    Home at Rest Exercise Oxygen Prescription  None      Initial 6 min Walk   Oxygen Used  None      Program Oxygen Prescription   Program Oxygen Prescription  None      Intervention   Short Term Goals  To learn and understand importance of monitoring SPO2 with pulse oximeter and demonstrate accurate use of the pulse oximeter.;To learn and understand importance of maintaining oxygen saturations>88%;To learn and demonstrate proper pursed lip breathing techniques or other breathing techniques.    Long  Term Goals  Verbalizes importance of monitoring SPO2 with pulse oximeter and return demonstration;Maintenance of O2 saturations>88%;Exhibits proper breathing techniques, such as pursed lip breathing or other method taught  during program session       Oxygen Re-Evaluation:   Oxygen Discharge (Final Oxygen Re-Evaluation):   Initial Exercise Prescription: Initial Exercise Prescription - 01/01/18 0900      Date of Initial Exercise RX and Referring Provider   Date  01/01/18    Referring Provider  Dr. Chase Caller      Treadmill   MPH  2    Grade  0    Minutes  15    METs  2.5      NuStep   Level  2    SPM  97    Minutes  20    METs  1.9      Prescription Details   Frequency (times per week)  2    Duration  Progress to 30 minutes of continuous aerobic without signs/symptoms of physical distress  Intensity   THRR 40-80% of Max Heartrate  [phone]    Ratings of Perceived Exertion  11-13    Perceived Dyspnea  0-4      Progression   Progression  Continue progressive overload as per policy without signs/symptoms or physical distress.      Resistance Training   Training Prescription  Yes    Weight  1    Reps  10-15       Perform Capillary Blood Glucose checks as needed.  Exercise Prescription Changes:   Exercise Comments:   Exercise Goals and Review: Exercise Goals    Row Name 01/01/18 0939             Exercise Goals   Increase Physical Activity  Yes       Intervention  Provide advice, education, support and counseling about physical activity/exercise needs.;Develop an individualized exercise prescription for aerobic and resistive training based on initial evaluation findings, risk stratification, comorbidities and participant's personal goals.       Expected Outcomes  Achievement of increased cardiorespiratory fitness and enhanced flexibility, muscular endurance and strength shown through measurements of functional capacity and personal statement of participant.       Increase Strength and Stamina  Yes       Intervention  Provide advice, education, support and counseling about physical activity/exercise needs.;Develop an individualized exercise prescription for aerobic and  resistive training based on initial evaluation findings, risk stratification, comorbidities and participant's personal goals.       Expected Outcomes  Achievement of increased cardiorespiratory fitness and enhanced flexibility, muscular endurance and strength shown through measurements of functional capacity and personal statement of participant.       Able to understand and use rate of perceived exertion (RPE) scale  Yes       Intervention  Provide education and explanation on how to use RPE scale       Expected Outcomes  Short Term: Able to use RPE daily in rehab to express subjective intensity level;Long Term:  Able to use RPE to guide intensity level when exercising independently       Able to understand and use Dyspnea scale  Yes       Intervention  Provide education and explanation on how to use Dyspnea scale       Expected Outcomes  Short Term: Able to use Dyspnea scale daily in rehab to express subjective sense of shortness of breath during exertion;Long Term: Able to use Dyspnea scale to guide intensity level when exercising independently       Knowledge and understanding of Target Heart Rate Range (THRR)  Yes       Intervention  Provide education and explanation of THRR including how the numbers were predicted and where they are located for reference       Expected Outcomes  Short Term: Able to state/look up THRR;Long Term: Able to use THRR to govern intensity when exercising independently;Short Term: Able to use daily as guideline for intensity in rehab       Able to check pulse independently  Yes       Intervention  Provide education and demonstration on how to check pulse in carotid and radial arteries.;Review the importance of being able to check your own pulse for safety during independent exercise       Expected Outcomes  Short Term: Able to explain why pulse checking is important during independent exercise;Long Term: Able to check pulse independently and accurately  Understanding  of Exercise Prescription  Yes       Intervention  Provide education, explanation, and written materials on patient's individual exercise prescription       Expected Outcomes  Short Term: Able to explain program exercise prescription;Long Term: Able to explain home exercise prescription to exercise independently          Exercise Goals Re-Evaluation :   Discharge Exercise Prescription (Final Exercise Prescription Changes):   Nutrition:  Target Goals: Understanding of nutrition guidelines, daily intake of sodium 1500mg , cholesterol 200mg , calories 30% from fat and 7% or less from saturated fats, daily to have 5 or more servings of fruits and vegetables.  Biometrics: Pre Biometrics - 01/01/18 0939      Pre Biometrics   Height  5\' 9"  (1.753 m)    Weight  227 lb 9.6 oz (103.2 kg)    Waist Circumference  43.5 inches    Hip Circumference  42 inches    Waist to Hip Ratio  1.04 %    BMI (Calculated)  33.6    Triceps Skinfold  16 mm    % Body Fat  31.5 %    Grip Strength  63.67 kg    Flexibility  0 in    Single Leg Stand  7 seconds        Nutrition Therapy Plan and Nutrition Goals: Nutrition Therapy & Goals - 01/01/18 1044      Personal Nutrition Goals   Personal Goal #2  Eats a heart healthy diet       Nutrition Discharge: Rate Your Plate Scores: Nutrition Assessments - 01/01/18 1044      MEDFICTS Scores   Pre Score  53       Nutrition Goals Re-Evaluation:   Nutrition Goals Discharge (Final Nutrition Goals Re-Evaluation):   Psychosocial: Target Goals: Acknowledge presence or absence of significant depression and/or stress, maximize coping skills, provide positive support system. Participant is able to verbalize types and ability to use techniques and skills needed for reducing stress and depression.  Initial Review & Psychosocial Screening: Initial Psych Review & Screening - 01/01/18 1053      Initial Review   Current issues with  None Identified      Family  Dynamics   Good Support System?  Yes      Barriers   Psychosocial barriers to participate in program  There are no identifiable barriers or psychosocial needs.      Screening Interventions   Interventions  Encouraged to exercise       Quality of Life Scores: Quality of Life - 01/01/18 0940      Quality of Life Scores   Health/Function Pre  20.67 %    Socioeconomic Pre  28.8 %    Psych/Spiritual Pre  26.57 %    Family Pre  28.8 %    GLOBAL Pre  24.5 %       PHQ-9: Recent Review Flowsheet Data    Depression screen [facility] 2/9 01/01/2018 02/04/2017 10/09/2016   Decreased Interest 0 0 0   Down, Depressed, Hopeless 0 0 0   PHQ - 2 Score 0 0 0   Altered sleeping 1 - -   Tired, decreased energy 1 - -   Change in appetite 0 - -   Feeling bad or failure about yourself  0 - -   Trouble concentrating 0 - -   Moving slowly or fidgety/restless 0 - -   Suicidal thoughts 0 - -   PHQ-9 Score  2 - -   Difficult doing work/chores Somewhat difficult - -     Interpretation of Total Score  Total Score Depression Severity:  1-4 = Minimal depression, 5-9 = Mild depression, 10-14 = Moderate depression, 15-19 = Moderately severe depression, 20-27 = Severe depression   Psychosocial Evaluation and Intervention: Psychosocial Evaluation - 01/01/18 1054      Psychosocial Evaluation & Interventions   Interventions  Encouraged to exercise with the program and follow exercise prescription    Continue Psychosocial Services   No Follow up required       Psychosocial Re-Evaluation:   Psychosocial Discharge (Final Psychosocial Re-Evaluation):    Education: Education Goals: Education classes will be provided on a weekly basis, covering required topics. Participant will state understanding/return demonstration of topics presented.  Learning Barriers/Preferences: Learning Barriers/Preferences - 01/01/18 1000      Learning Barriers/Preferences   Learning Barriers  Hearing    Learning Preferences   Group Instruction;Skilled Demonstration       Education Topics: How Lungs Work and Diseases: - Discuss the anatomy of the lungs and diseases that can affect the lungs, such as COPD.   Exercise: -Discuss the importance of exercise, FITT principles of exercise, normal and abnormal responses to exercise, and how to exercise safely.   Environmental Irritants: -Discuss types of environmental irritants and how to limit exposure to environmental irritants.   Meds/Inhalers and oxygen: - Discuss respiratory medications, definition of an inhaler and oxygen, and the proper way to use an inhaler and oxygen.   Energy Saving Techniques: - Discuss methods to conserve energy and decrease shortness of breath when performing activities of daily living.    Bronchial Hygiene / Breathing Techniques: - Discuss breathing mechanics, pursed-lip breathing technique,  proper posture, effective ways to clear airways, and other functional breathing techniques   Cleaning Equipment: - Provides group verbal and written instruction about the health risks of elevated stress, cause of high stress, and healthy ways to reduce stress.   Nutrition I: Fats: - Discuss the types of cholesterol, what cholesterol does to the body, and how cholesterol levels can be controlled.   Nutrition II: Labels: -Discuss the different components of food labels and how to read food labels.   Respiratory Infections: - Discuss the signs and symptoms of respiratory infections, ways to prevent respiratory infections, and the importance of seeking medical treatment when having a respiratory infection.   Stress I: Signs and Symptoms: - Discuss the causes of stress, how stress may lead to anxiety and depression, and ways to limit stress.   Stress II: Relaxation: -Discuss relaxation techniques to limit stress.   Oxygen for Home/Travel: - Discuss how to prepare for travel when on oxygen and proper ways to transport and store  oxygen to ensure safety.   Knowledge Questionnaire Score: Knowledge Questionnaire Score - 01/01/18 1012      Knowledge Questionnaire Score   Pre Score  14/18       Core Components/Risk Factors/Patient Goals at Admission: Personal Goals and Risk Factors at Admission - 01/01/18 1048      Core Components/Risk Factors/Patient Goals on Admission    Weight Management  Yes    Intervention  Weight Management/Obesity: Establish reasonable short term and long term weight goals.    Admit Weight  227 lb 9.6 oz (103.2 kg)    Goal Weight: Short Term  217 lb 9.6 oz (98.7 kg)    Goal Weight: Long Term  207 lb 9.6 oz (94.2 kg)    Expected Outcomes  Short Term: Continue to assess and modify interventions until short term weight is achieved;Long Term: Adherence to nutrition and physical activity/exercise program aimed toward attainment of established weight goal    Personal Goal Other  Yes    Personal Goal  Breathe better, lose 45lbs, get back in physical shape    Intervention  Attend program 2 x week and supplement with home exercise 3 x week.     Expected Outcomes  Attain personal goals.        Core Components/Risk Factors/Patient Goals Review:    Core Components/Risk Factors/Patient Goals at Discharge (Final Review):    ITP Comments:   Comments: Patient arrived for 1st visit/orientation/education at 0800. Patient was referred to PR by Dr. Chase Caller due to Dyspnea on exertion (R06.09). During orientation advised patient on arrival and appointment times what to wear, what to do before, during and after exercise. Reviewed attendance and class policy. Talked about inclement weather and class consultation policy. Pt is scheduled to return Pulmonary Rehab on 01/06/2018 at 10:45. Pt was advised to come to class 15 minutes before class starts. Patient was also given instructions on meeting with the dietician and attending the Family Structure classes. Discussed RPE/Dpysnea scales. Discussed initial THR  and how to find their radial and/or carotid pulse. Discussed the initial exercise prescription and how this effects their progress. Pt is eager to get started. Patient participated in warm up stretches followed by light weights and resistance bands. Patient was able to complete 6 minute walk test. Patient did not c/o pain. Patient was measured for the equipment. Discussed equipment safety with patient. Took patient pre-anthropometric measurements. Patient finished visit at 10:00.

## 2018-01-01 NOTE — Progress Notes (Signed)
Cardiac/Pulmonary Rehab Medication Review by a Pharmacist  Does the patient  feel that his/her medications are working for him/her?  Yes, still have issues with RA treatment  Has the patient been experiencing any side effects to the medications prescribed?  no  Does the patient measure his/her own blood pressure or blood glucose at home?  no   Does the patient have any problems obtaining medications due to transportation or finances?   no  Understanding of regimen: good Understanding of indications: good Potential of compliance: good, occasionally misses doses if in a hurry and forgets to take his medications.  Pricilla Larsson 01/01/2018 9:49 AM

## 2018-01-01 NOTE — Progress Notes (Signed)
Daily Session Note  Patient Details  Name: MACARTHUR LORUSSO MRN: [id number] Date of Birth: 01-01-1951 Referring Provider:    Encounter Date: 01/01/2018  Check In: Session Check In - 01/01/18 0800      Check-In   Location  [facility]    Staff Present  Darnetta Kesselman Angelina Pih, MS, EP, [facility], Exercise Physiologist;Gregory Luther Parody, BS, EP, Exercise Physiologist;Debra Wynetta Emery, RN, BSN    Supervising physician immediately available to respond to emergencies  See telemetry face sheet for immediately available MD    Medication changes reported      No    Fall or balance concerns reported     No    Tobacco Cessation  No Change    Warm-up and Cool-down  Performed as group-led instruction    Resistance Training Performed  Yes    VAD Patient?  No      Pain Assessment   Currently in Pain?  No/denies    Multiple Pain Sites  No       Capillary Blood Glucose: No results found for this or any previous visit (from the past 24 hour(s)).    Social History   Tobacco Use  Smoking Status Former Smoker  . Packs/day: 3.00  . Years: 20.00  . Pack years: 60.00  . Types: Cigarettes  . Last attempt to quit: 12/30/1989  . Years since quitting: 28.0  Smokeless Tobacco Never Used    Goals Met:  Proper associated with RPD/PD & O2 Sat Improved SOB with ADL's Exercise tolerated well No report of cardiac concerns or symptoms Strength training completed today  Goals Unmet:  Not Applicable  Comments: Check out: 1030   Dr. Sinda Du is Medical Director for [facility].

## 2018-01-06 ENCOUNTER — Encounter (HOSPITAL_COMMUNITY)
Admission: RE | Admit: 2018-01-06 | Discharge: 2018-01-06 | Disposition: A | Discharge status: Home / Self Care | Payer: Medicare Other | Source: Ambulatory Visit | Attending: Internal Medicine | Admitting: Internal Medicine

## 2018-01-06 DIAGNOSIS — R0609 Other forms of dyspnea: Principal | ICD-10-CM

## 2018-01-06 DIAGNOSIS — R0600 Dyspnea, unspecified: Secondary | ICD-10-CM | POA: Diagnosis not present

## 2018-01-06 NOTE — Progress Notes (Signed)
Daily Session Note  Patient Details  Name: [name] MRN: [id number] Date of Birth: [birth year] Referring Provider:     PULMONARY REHAB OTHER RESP ORIENTATION from 01/01/2018 in [place]  Referring Provider  Dr. Chase Caller      Encounter Date: 01/06/2018  Check In: Session Check In - 01/06/18 1339      Check-In   Location  [facility]    Supervising physician immediately available to respond to emergencies  See telemetry face sheet for immediately available MD    Medication changes reported      No    Fall or balance concerns reported     No    Warm-up and Cool-down  Performed as group-led instruction    Resistance Training Performed  Yes    VAD Patient?  No      Pain Assessment   Currently in Pain?  No/denies    Pain Score  0-No pain    Multiple Pain Sites  No       Capillary Blood Glucose: No results found for this or any previous visit (from the past 24 hour(s)).    Social History   Tobacco Use  Smoking Status Former Smoker  . Packs/day: 3.00  . Years: 20.00  . Pack years: 60.00  . Types: Cigarettes  . Last attempt to quit: 12/30/1989  . Years since quitting: 28.0  Smokeless Tobacco Never Used    Goals Met:  Independence with exercise equipment Improved SOB with ADL's Using PLB without cueing & demonstrates good technique Exercise tolerated well No report of cardiac concerns or symptoms Strength training completed today  Goals Unmet:  Not Applicable  Comments: Check out 230   Dr. Sinda Du is Medical Director for [facility] Pulmonary Rehab.

## 2018-01-08 ENCOUNTER — Encounter (HOSPITAL_COMMUNITY)
Admission: RE | Admit: 2018-01-08 | Discharge: 2018-01-08 | Disposition: A | Discharge status: Home / Self Care | Payer: Medicare Other | Source: Ambulatory Visit | Attending: Internal Medicine | Admitting: Internal Medicine

## 2018-01-08 DIAGNOSIS — R0609 Other forms of dyspnea: Principal | ICD-10-CM

## 2018-01-08 DIAGNOSIS — R0600 Dyspnea, unspecified: Secondary | ICD-10-CM | POA: Diagnosis not present

## 2018-01-08 NOTE — Progress Notes (Signed)
Patient received the take home exercise plan today. THR was addressed as were safety guidelines for being active around the house. Patient addressed an understanding and was encouraged to ask any future questions as they arise.

## 2018-01-08 NOTE — Progress Notes (Signed)
Daily Session Note  Patient Details  Name: TERIK HAUGHEY MRN: [id number] Date of Birth: 01/30/1951 Referring Provider:     PULMONARY REHAB OTHER RESP ORIENTATION from 01/01/2018 in [place]  Referring Provider  Dr. Chase Caller      Encounter Date: 01/08/2018  Check In: Session Check In - 01/08/18 1047      Check-In   Location  [facility]    Staff Present  Suzanne Boron, BS, EP, Exercise Physiologist;Debra Wynetta Emery, RN, BSN    Supervising physician immediately available to respond to emergencies  See telemetry face sheet for immediately available MD    Medication changes reported      No    Fall or balance concerns reported     No    Warm-up and Cool-down  Performed as group-led instruction    Resistance Training Performed  Yes    VAD Patient?  No      Pain Assessment   Currently in Pain?  No/denies    Pain Score  0-No pain    Multiple Pain Sites  No       Capillary Blood Glucose: No results found for this or any previous visit (from the past 24 hour(s)).    Social History   Tobacco Use  Smoking Status Former Smoker  . Packs/day: 3.00  . Years: 20.00  . Pack years: 60.00  . Types: Cigarettes  . Last attempt to quit: 12/30/1989  . Years since quitting: 28.0  Smokeless Tobacco Never Used    Goals Met:  Independence with exercise equipment Improved SOB with ADL's Using PLB without cueing & demonstrates good technique Exercise tolerated well No report of cardiac concerns or symptoms Strength training completed today  Goals Unmet:  Not Applicable  Comments: Check out 1145   Dr. Sinda Du is Medical Director for [facility].

## 2018-01-12 NOTE — Progress Notes (Signed)
Pulmonary Individual Treatment Plan  Patient Details  Name: [name] MRN: [id number] Date of Birth: 01-21-1951 Referring Provider:     PULMONARY REHAB OTHER RESP ORIENTATION from 01/01/2018 in [place]  Referring Provider  Dr. Chase Caller      Initial Encounter Date:    [place] from 01/01/2018 in [place]  Date  01/01/18  Referring Provider  Dr. Chase Caller      Visit Diagnosis: Dyspnea on exertion  Patient's Home Medications on Admission:   Current Outpatient Medications:  .  acetaminophen (TYLENOL) 500 MG tablet, Take 1,000 mg by mouth every 6 (six) hours as needed (arthritis pain)., Disp: , Rfl:  .  albuterol (PROVENTIL) (2.5 MG/3ML) 0.083% nebulizer solution, Take 3 mLs (2.5 mg total) by nebulization every 6 (six) hours as needed for wheezing or shortness of breath., Disp: 75 mL, Rfl: 12 .  albuterol (PROVENTIL) 2 MG tablet, Take 2 mg by mouth 2 (two) times daily., Disp: , Rfl:  .  alendronate (FOSAMAX) 70 MG tablet, Take 1 tablet (70 mg total) by mouth once a week. Take with a full glass of water on an empty stomach., Disp: 12 tablet, Rfl: 1 .  allopurinol (ZYLOPRIM) 100 MG tablet, TAKE 1 TABLET BY MOUTH TWICE A DAY, Disp: 180 tablet, Rfl: 1 .  Calcium-Phosphorus-Vitamin D (CITRACAL +D3 PO), Take 2 tablets by mouth 2 (two) times daily. , Disp: , Rfl:  .  dicyclomine (BENTYL) 10 MG capsule, TAKE 1 CAPSULE BY MOUTH 4 TIMES DAILY BEFORE MEALS AND AT BEDTIME (Patient taking differently: TAKE 2 CAPSULE BY MOUTH TWO TIMES DAILY), Disp: 120 capsule, Rfl: 0 .  doxycycline (VIBRA-TABS) 100 MG tablet, Take 100 mg by mouth 2 (two) times daily. For ten days.  Started 12/31/2017., Disp: , Rfl:  .  fluticasone furoate-vilanterol (BREO ELLIPTA) 100-25 MCG/INH AEPB, Inhale 1 puff into the lungs daily., Disp: 1 each, Rfl: 4 .  folic acid (FOLVITE) 1 MG tablet, TAKE 2 TABLETS BY MOUTH EVERY MORNING, Disp: 180 tablet,  Rfl: 4 .  furosemide (LASIX) 40 MG tablet, TAKE 1 TABLET BY MOUTH TWO TIMES DAILY (Patient taking differently: TAKE 2 TABLETS BY MOUTH (Evenings)), Disp: 60 tablet, Rfl: 8 .  leflunomide (ARAVA) 20 MG tablet, TAKE 1 TABLET BY MOUTH EVERY DAY, Disp: 30 tablet, Rfl: 2 .  omeprazole (PRILOSEC) 40 MG capsule, Take 40 mg by mouth 2 (two) times daily. , Disp: , Rfl: 2 .  Oxymetazoline HCl (NASAL SPRAY NA), Place 2 sprays into the nose daily as needed (allergies)., Disp: , Rfl:  .  phenytoin (DILANTIN) 100 MG ER capsule, Take 100-200 mg by mouth 2 (two) times daily. Take one capsule in the morning and 2 capsules at bedtime, Disp: , Rfl:  .  predniSONE (DELTASONE) 1 MG tablet, Take 4 tablets (4 mg total) by mouth daily with breakfast. Take with 5 mg tablet / 9 mg in Nov, 8mg  in Dec, 7mg  in [place] by 1 mg per month, Disp: 270 tablet, Rfl: 1 .  PROAIR HFA 108 (90 Base) MCG/ACT inhaler, Inhale 1-2 puffs into the lungs every 6 (six) hours as needed. , Disp: , Rfl:  .  Tocilizumab (ACTEMRA IV), Inject into the vein every 28 (twenty-eight) days. For Rheumatoid Arthritis, Disp: , Rfl:  .  umeclidinium bromide (INCRUSE ELLIPTA) 62.5 MCG/INH AEPB, Inhale 1 puff into the lungs daily., Disp: 3 each, Rfl: 1 .  warfarin (COUMADIN) 3 MG tablet, Take 3 mg by mouth at  bedtime. , Disp: , Rfl:   Past Medical History: Past Medical History:  Diagnosis Date  . Anxiety    hx of   . Arthritis   . Asthma   . Clotting disorder (HCC)    DVT both legs   . COPD (chronic obstructive pulmonary disease) ([place])   . DDD (degenerative disc disease), cervical    with UE's paresthesias  . Diverticulosis   . DVT, lower extremity ([place])    bilat  . 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 GERD (gastroesophageal reflux disease)   . Gout   . Heart murmur   . History of measles   . History of shingles   . Hypercholesterolemia   . IBS (irritable bowel syndrome)   . IBS  (irritable bowel syndrome)   . Peripheral edema   . Pneumonia    hx of   . Prostate cancer ([place])   . Seizures ([place])    last seizure 20 years ago   . Shortness of breath dyspnea    exertion   . Sleep apnea    not on cpap  . Tubular adenoma of colon 04/2008    Tobacco Use: Social History   Tobacco Use  Smoking Status Former Smoker  . Packs/day: 3.00  . Years: 20.00  . Pack years: 60.00  . Types: Cigarettes  . Last attempt to quit: 12/30/1989  . Years since quitting: 28.0  Smokeless Tobacco Never Used    Labs: Recent Review Scientist, physiological    Labs for ITP Cardiac and Pulmonary Rehab Latest Ref Rng & Units 07/01/2010   TCO2 0 - 100 mmol/L 27      Capillary Blood Glucose: Lab Results  Component Value Date   GLUCAP 101 (H) 11/28/2008     Pulmonary Assessment Scores: Pulmonary Assessment Scores    Row Name 01/01/18 1017         ADL UCSD   ADL Phase  Entry     SOB Score total  18     Rest  0     Walk  2     Stairs  2     Bath  1     Dress  1     Shop  1       CAT Score   CAT Score  10       mMRC Score   mMRC Score  2        Pulmonary Function Assessment: Pulmonary Function Assessment - 01/01/18 1013      Pulmonary Function Tests   FVC%  49 %    FEV1%  54 %    FEV1/FVC Ratio  110    DLCO%  63 %      Initial Spirometry Results   FVC%  49 %    FEV1%  54 %    FEV1/FVC Ratio  110      Post Bronchodilator Spirometry Results   FVC%  48 %    FEV1%  54 %    FEV1/FVC Ratio  113      Breath   Bilateral Breath Sounds  Clear    Shortness of Breath  No       Exercise Target Goals:    Exercise Program Goal: Individual exercise prescription set with THRR, safety & activity barriers. Participant demonstrates ability to understand and report RPE using BORG scale, to self-measure pulse accurately, and to acknowledge the importance of  the exercise prescription.  Exercise Prescription Goal: Starting with aerobic activity 30 plus minutes a day, 3 days per  week for initial exercise prescription. Provide home exercise prescription and guidelines that participant acknowledges understanding prior to discharge.  Activity Barriers & Risk Stratification: Activity Barriers & Cardiac Risk Stratification - 01/01/18 0936      Activity Barriers & Cardiac Risk Stratification   Activity Barriers  Back Problems;Deconditioning    Cardiac Risk Stratification  Low       6 Minute Walk: 6 Minute Walk    Row Name 01/01/18 0932         6 Minute Walk   Phase  Initial     Distance  1200 feet     Distance % Change  0 %     Distance Feet Change  0 ft     Walk Time  6 minutes     # of Rest Breaks  0     MPH  2.27     METS  2.74     RPE  9     Perceived Dyspnea   9     VO2 Peak  9.54     Symptoms  No     Resting HR  84 bpm     Resting BP  136/67     Resting Oxygen Saturation   94 %     Exercise Oxygen Saturation  during 6 min walk  91 %     Max Ex. HR  105 bpm     Max Ex. BP  146/74     2 Minute Post BP  136/66        Oxygen Initial Assessment: Oxygen Initial Assessment - 01/01/18 1023      Home Oxygen   Home Oxygen Device  None    Sleep Oxygen Prescription  None    Home Exercise Oxygen Prescription  None    Home at Rest Exercise Oxygen Prescription  None      Initial 6 min Walk   Oxygen Used  None      Program Oxygen Prescription   Program Oxygen Prescription  None      Intervention   Short Term Goals  To learn and understand importance of monitoring SPO2 with pulse oximeter and demonstrate accurate use of the pulse oximeter.;To learn and understand importance of maintaining oxygen saturations>88%;To learn and demonstrate proper pursed lip breathing techniques or other breathing techniques.    Long  Term Goals  Verbalizes importance of monitoring SPO2 with pulse oximeter and return demonstration;Maintenance of O2 saturations>88%;Exhibits proper breathing techniques, such as pursed lip breathing or other method taught during program  session       Oxygen Re-Evaluation:   Oxygen Discharge (Final Oxygen Re-Evaluation):   Initial Exercise Prescription: Initial Exercise Prescription - 01/01/18 0900      Date of Initial Exercise RX and Referring Provider   Date  01/01/18    Referring Provider  Dr. Chase Caller      Treadmill   MPH  2    Grade  0    Minutes  15    METs  2.5      NuStep   Level  2    SPM  97    Minutes  20    METs  1.9      Prescription Details   Frequency (times per week)  2    Duration  Progress to 30 minutes of continuous aerobic without signs/symptoms of physical distress  Intensity   THRR 40-80% of Max Heartrate  [phone]    Ratings of Perceived Exertion  11-13    Perceived Dyspnea  0-4      Progression   Progression  Continue progressive overload as per policy without signs/symptoms or physical distress.      Resistance Training   Training Prescription  Yes    Weight  1    Reps  10-15       Perform Capillary Blood Glucose checks as needed.  Exercise Prescription Changes:  Exercise Prescription Changes    Row Name 01/08/18 1200 01/09/18 1400           Response to Exercise   Blood Pressure (Admit)  -  144/76      Blood Pressure (Exercise)  -  140/80      Blood Pressure (Exit)  -  138/70      Heart Rate (Admit)  -  80 bpm      Heart Rate (Exercise)  -  98 bpm      Heart Rate (Exit)  -  105 bpm      Oxygen Saturation (Admit)  -  93 %      Oxygen Saturation (Exercise)  -  92 %      Oxygen Saturation (Exit)  -  92 %      Rating of Perceived Exertion (Exercise)  -  11      Perceived Dyspnea (Exercise)  -  11      Duration  -  Progress to 30 minutes of  aerobic without signs/symptoms of physical distress      Intensity  -  THRR New [phone]        Progression   Progression  -  Continue to progress workloads to maintain intensity without signs/symptoms of physical distress.        Resistance Training   Training Prescription  Yes  Yes      Weight  1  1       Reps  10-15  10-15        Treadmill   MPH  2  2      Grade  0  0      Minutes  15  15      METs  2.5  2.5        NuStep   Level  2  2      SPM  97  111      Minutes  20  20      METs  1.9  2.7        Home Exercise Plan   Plans to continue exercise at  Home (comment)  Home (comment)      Frequency  Add 2 additional days to program exercise sessions.  Add 2 additional days to program exercise sessions.      Initial Home Exercises Provided  01/08/17  01/08/17         Exercise Comments:  Exercise Comments    Row Name 01/08/18 1257 01/09/18 1431         Exercise Comments  Patient received the take home exercise plan today. THR was addressed as were safety guidelines for being active around the house. Patient addressed an understanding and was encouraged to ask any future questions as they arise.   Patient is doing well in PR and has increased his SPMs on the Nustep. He has jsut started the program but will be progressed more in time. We will help  him reach his goals of breathing better and getting back into better shape.          Exercise Goals and Review:  Exercise Goals    Row Name 01/01/18 0939             Exercise Goals   Increase Physical Activity  Yes       Intervention  Provide advice, education, support and counseling about physical activity/exercise needs.;Develop an individualized exercise prescription for aerobic and resistive training based on initial evaluation findings, risk stratification, comorbidities and participant's personal goals.       Expected Outcomes  Achievement of increased cardiorespiratory fitness and enhanced flexibility, muscular endurance and strength shown through measurements of functional capacity and personal statement of participant.       Increase Strength and Stamina  Yes       Intervention  Provide advice, education, support and counseling about physical activity/exercise needs.;Develop an individualized exercise prescription for  aerobic and resistive training based on initial evaluation findings, risk stratification, comorbidities and participant's personal goals.       Expected Outcomes  Achievement of increased cardiorespiratory fitness and enhanced flexibility, muscular endurance and strength shown through measurements of functional capacity and personal statement of participant.       Able to understand and use rate of perceived exertion (RPE) scale  Yes       Intervention  Provide education and explanation on how to use RPE scale       Expected Outcomes  Short Term: Able to use RPE daily in rehab to express subjective intensity level;Long Term:  Able to use RPE to guide intensity level when exercising independently       Able to understand and use Dyspnea scale  Yes       Intervention  Provide education and explanation on how to use Dyspnea scale       Expected Outcomes  Short Term: Able to use Dyspnea scale daily in rehab to express subjective sense of shortness of breath during exertion;Long Term: Able to use Dyspnea scale to guide intensity level when exercising independently       Knowledge and understanding of Target Heart Rate Range (THRR)  Yes       Intervention  Provide education and explanation of THRR including how the numbers were predicted and where they are located for reference       Expected Outcomes  Short Term: Able to state/look up THRR;Long Term: Able to use THRR to govern intensity when exercising independently;Short Term: Able to use daily as guideline for intensity in rehab       Able to check pulse independently  Yes       Intervention  Provide education and demonstration on how to check pulse in carotid and radial arteries.;Review the importance of being able to check your own pulse for safety during independent exercise       Expected Outcomes  Short Term: Able to explain why pulse checking is important during independent exercise;Long Term: Able to check pulse independently and accurately        Understanding of Exercise Prescription  Yes       Intervention  Provide education, explanation, and written materials on patient's individual exercise prescription       Expected Outcomes  Short Term: Able to explain program exercise prescription;Long Term: Able to explain home exercise prescription to exercise independently          Exercise Goals Re-Evaluation : Exercise Goals Re-Evaluation  [place] Name 01/09/18 1429             Exercise Goal Re-Evaluation   Exercise Goals Review  Increase Physical Activity;Increase Strength and Stamina;Knowledge and understanding of Target Heart Rate Range (THRR)       Comments  Patient is doing well in PR and has increased his SPMs on the Nustep. He has jsut started the program but will be progressed more in time. We will help him reach his goals of breathing better and getting back into better shape.        Expected Outcomes  Patient wishes to breathe better and to get back into better shape          Discharge Exercise Prescription (Final Exercise Prescription Changes): Exercise Prescription Changes - 01/09/18 1400      Response to Exercise   Blood Pressure (Admit)  144/76    Blood Pressure (Exercise)  140/80    Blood Pressure (Exit)  138/70    Heart Rate (Admit)  80 bpm    Heart Rate (Exercise)  98 bpm    Heart Rate (Exit)  105 bpm    Oxygen Saturation (Admit)  93 %    Oxygen Saturation (Exercise)  92 %    Oxygen Saturation (Exit)  92 %    Rating of Perceived Exertion (Exercise)  11    Perceived Dyspnea (Exercise)  11    Duration  Progress to 30 minutes of  aerobic without signs/symptoms of physical distress    Intensity  THRR New [phone]      Progression   Progression  Continue to progress workloads to maintain intensity without signs/symptoms of physical distress.      Resistance Training   Training Prescription  Yes    Weight  1    Reps  10-15      Treadmill   MPH  2    Grade  0    Minutes  15    METs  2.5      NuStep    Level  2    SPM  111    Minutes  20    METs  2.7      Home Exercise Plan   Plans to continue exercise at  Home (comment)    Frequency  Add 2 additional days to program exercise sessions.    Initial Home Exercises Provided  01/08/17       Nutrition:  Target Goals: Understanding of nutrition guidelines, daily intake of sodium 1500mg , cholesterol 200mg , calories 30% from fat and 7% or less from saturated fats, daily to have 5 or more servings of fruits and vegetables.  Biometrics: Pre Biometrics - 01/01/18 0939      Pre Biometrics   Height  5\' 9"  (1.753 m)    Weight  227 lb 9.6 oz (103.2 kg)    Waist Circumference  43.5 inches    Hip Circumference  42 inches    Waist to Hip Ratio  1.04 %    BMI (Calculated)  33.6    Triceps Skinfold  16 mm    % Body Fat  31.5 %    Grip Strength  63.67 kg    Flexibility  0 in    Single Leg Stand  7 seconds        Nutrition Therapy Plan and Nutrition Goals: Nutrition Therapy & Goals - 01/01/18 1044      Personal Nutrition Goals   Personal Goal #2  Eats a heart healthy diet  Nutrition Discharge: Rate Your Plate Scores: Nutrition Assessments - 01/01/18 1044      MEDFICTS Scores   Pre Score  53       Nutrition Goals Re-Evaluation:   Nutrition Goals Discharge (Final Nutrition Goals Re-Evaluation):   Psychosocial: Target Goals: Acknowledge presence or absence of significant depression and/or stress, maximize coping skills, provide positive support system. Participant is able to verbalize types and ability to use techniques and skills needed for reducing stress and depression.  Initial Review & Psychosocial Screening: Initial Psych Review & Screening - 01/01/18 1053      Initial Review   Current issues with  None Identified      Family Dynamics   Good Support System?  Yes      Barriers   Psychosocial barriers to participate in program  There are no identifiable barriers or psychosocial needs.      Screening  Interventions   Interventions  Encouraged to exercise       Quality of Life Scores: Quality of Life - 01/01/18 0940      Quality of Life Scores   Health/Function Pre  20.67 %    Socioeconomic Pre  28.8 %    Psych/Spiritual Pre  26.57 %    Family Pre  28.8 %    GLOBAL Pre  24.5 %       PHQ-9: Recent Review Flowsheet Data    Depression screen [facility] 2/9 01/01/2018 02/04/2017 10/09/2016   Decreased Interest 0 0 0   Down, Depressed, Hopeless 0 0 0   PHQ - 2 Score 0 0 0   Altered sleeping 1 - -   Tired, decreased energy 1 - -   Change in appetite 0 - -   Feeling bad or failure about yourself  0 - -   Trouble concentrating 0 - -   Moving slowly or fidgety/restless 0 - -   Suicidal thoughts 0 - -   PHQ-9 Score 2 - -   Difficult doing work/chores Somewhat difficult - -     Interpretation of Total Score  Total Score Depression Severity:  1-4 = Minimal depression, 5-9 = Mild depression, 10-14 = Moderate depression, 15-19 = Moderately severe depression, 20-27 = Severe depression   Psychosocial Evaluation and Intervention: Psychosocial Evaluation - 01/01/18 1054      Psychosocial Evaluation & Interventions   Interventions  Encouraged to exercise with the program and follow exercise prescription    Continue Psychosocial Services   No Follow up required       Psychosocial Re-Evaluation:   Psychosocial Discharge (Final Psychosocial Re-Evaluation):    Education: Education Goals: Education classes will be provided on a weekly basis, covering required topics. Participant will state understanding/return demonstration of topics presented.  Learning Barriers/Preferences: Learning Barriers/Preferences - 01/01/18 1000      Learning Barriers/Preferences   Learning Barriers  Hearing    Learning Preferences  Group Instruction;Skilled Demonstration       Education Topics: How Lungs Work and Diseases: - Discuss the anatomy of the lungs and diseases that can affect the lungs, such as  COPD.   Exercise: -Discuss the importance of exercise, FITT principles of exercise, normal and abnormal responses to exercise, and how to exercise safely.   Environmental Irritants: -Discuss types of environmental irritants and how to limit exposure to environmental irritants.   Meds/Inhalers and oxygen: - Discuss respiratory medications, definition of an inhaler and oxygen, and the proper way to use an inhaler and oxygen.   Energy Saving Techniques: -  Discuss methods to conserve energy and decrease shortness of breath when performing activities of daily living.    Bronchial Hygiene / Breathing Techniques: - Discuss breathing mechanics, pursed-lip breathing technique,  proper posture, effective ways to clear airways, and other functional breathing techniques   Cleaning Equipment: - Provides group verbal and written instruction about the health risks of elevated stress, cause of high stress, and healthy ways to reduce stress.   Nutrition I: Fats: - Discuss the types of cholesterol, what cholesterol does to the body, and how cholesterol levels can be controlled.   Nutrition II: Labels: -Discuss the different components of food labels and how to read food labels.   Respiratory Infections: - Discuss the signs and symptoms of respiratory infections, ways to prevent respiratory infections, and the importance of seeking medical treatment when having a respiratory infection.   Stress I: Signs and Symptoms: - Discuss the causes of stress, how stress may lead to anxiety and depression, and ways to limit stress.   PULMONARY REHAB OTHER RESPIRATORY from 01/08/2018 in [place]  Date  01/08/18  Educator  Marya Amsler C.  Instruction Review Code  2- Demonstrated Understanding      Stress II: Relaxation: -Discuss relaxation techniques to limit stress.   Oxygen for Home/Travel: - Discuss how to prepare for travel when on oxygen and proper ways to transport and  store oxygen to ensure safety.   Knowledge Questionnaire Score: Knowledge Questionnaire Score - 01/01/18 1012      Knowledge Questionnaire Score   Pre Score  14/18       Core Components/Risk Factors/Patient Goals at Admission: Personal Goals and Risk Factors at Admission - 01/01/18 1048      Core Components/Risk Factors/Patient Goals on Admission    Weight Management  Yes    Intervention  Weight Management/Obesity: Establish reasonable short term and long term weight goals.    Admit Weight  227 lb 9.6 oz (103.2 kg)    Goal Weight: Short Term  217 lb 9.6 oz (98.7 kg)    Goal Weight: Long Term  207 lb 9.6 oz (94.2 kg)    Expected Outcomes  Short Term: Continue to assess and modify interventions until short term weight is achieved;Long Term: Adherence to nutrition and physical activity/exercise program aimed toward attainment of established weight goal    Personal Goal Other  Yes    Personal Goal  Breathe better, lose 45lbs, get back in physical shape    Intervention  Attend program 2 x week and supplement with home exercise 3 x week.     Expected Outcomes  Attain personal goals.        Core Components/Risk Factors/Patient Goals Review:    Core Components/Risk Factors/Patient Goals at Discharge (Final Review):    ITP Comments: ITP Comments    Row Name 01/12/18 1254           ITP Comments  Patient new to program completing 3 sessions. Will continue to monitor.           Comments: ITP 30 Day REVIEW Patient new to program completing 3 sessions. Will continue to monitor.

## 2018-01-13 ENCOUNTER — Encounter (HOSPITAL_COMMUNITY)
Admission: RE | Admit: 2018-01-13 | Discharge: 2018-01-13 | Disposition: A | Discharge status: Home / Self Care | Payer: Medicare Other | Source: Ambulatory Visit | Attending: Internal Medicine | Admitting: Internal Medicine

## 2018-01-13 DIAGNOSIS — R0600 Dyspnea, unspecified: Secondary | ICD-10-CM | POA: Diagnosis not present

## 2018-01-13 DIAGNOSIS — R0609 Other forms of dyspnea: Principal | ICD-10-CM

## 2018-01-13 NOTE — Telephone Encounter (Signed)
Received a fax from [facility] with a BIV update for pt Actemra Infusions. A prior auth/ pre-cert will not be required for pts to receive Actemra infusions at [facility] with his primary (Medicare or Medigap Supplement)  Will send document to scan center.  Joanthan Hlavacek, [place], CPhT 4:08 PM

## 2018-01-13 NOTE — Progress Notes (Signed)
Daily Session Note  Patient Details  Name: [name] MRN: [id number] Date of Birth: 08/15/1951 Referring Provider:     PULMONARY REHAB OTHER RESP ORIENTATION from 01/01/2018 in [place]  Referring Provider  Dr. Chase [name]      Encounter Date: 01/13/2018  Check In: Session Check In - 01/13/18 1103      Check-In   Location  [facility]    Staff Present  Mark Angelina Pih, MS, EP, [name], Exercise Physiologist;[name], BS, EP, Exercise Physiologist    Supervising physician immediately available to respond to emergencies  See telemetry face sheet for immediately available MD    Medication changes reported      No    Fall or balance concerns reported     No    Warm-up and Cool-down  Performed as group-led instruction    Resistance Training Performed  Yes    VAD Patient?  No      Pain Assessment   Currently in Pain?  No/denies    Pain Score  0-No pain    Multiple Pain Sites  No       Capillary Blood Glucose: No results found for this or any previous visit (from the past 24 hour(s)).    Social History   Tobacco Use  Smoking Status Former Smoker  . Packs/day: 3.00  . Years: 20.00  . Pack years: 60.00  . Types: Cigarettes  . Last attempt to quit: 12/30/1989  . Years since quitting: 28.0  Smokeless Tobacco Never Used    Goals Met:  Independence with exercise equipment Improved SOB with ADL's Using PLB without cueing & demonstrates good technique Exercise tolerated well No report of cardiac concerns or symptoms Strength training completed today  Goals Unmet:  Not Applicable  Comments: Check out 1145   Dr. Sinda [name] is Medical Director for [facility].

## 2018-01-15 ENCOUNTER — Encounter (HOSPITAL_COMMUNITY)
Admission: RE | Admit: 2018-01-15 | Discharge: 2018-01-15 | Disposition: A | Discharge status: Home / Self Care | Payer: Medicare Other | Source: Ambulatory Visit | Attending: Internal Medicine | Admitting: Internal Medicine

## 2018-01-15 DIAGNOSIS — R0600 Dyspnea, unspecified: Secondary | ICD-10-CM | POA: Diagnosis not present

## 2018-01-15 DIAGNOSIS — R0609 Other forms of dyspnea: Principal | ICD-10-CM

## 2018-01-15 NOTE — Progress Notes (Signed)
Daily Session Note  Patient Details  Name: [name] MRN: [id number] Date of Birth: 02/11/1951 Referring Provider:     PULMONARY REHAB OTHER RESP ORIENTATION from 01/01/2018 in [place] [facility]  Referring Provider  Dr. Ramaswamy      Encounter Date: 01/15/2018  Check In: Session Check In - 01/15/18 1045      Check-In   Location  [facility]    Staff Present  Diane Coad, MS, EP, [facility], Exercise Physiologist;Gregory Cowan, BS, EP, Exercise Physiologist    Supervising physician immediately available to respond to emergencies  See telemetry face sheet for immediately available MD    Medication changes reported      No    Fall or balance concerns reported     No    Warm-up and Cool-down  Performed as group-led instruction    Resistance Training Performed  Yes    VAD Patient?  No      Pain Assessment   Currently in Pain?  No/denies    Pain Score  0-No pain    Multiple Pain Sites  No       Capillary Blood Glucose: No results found for this or any previous visit (from the past 24 hour(s)).    Social History   Tobacco Use  Smoking Status Former Smoker  . Packs/day: 3.00  . Years: 20.00  . Pack years: 60.00  . Types: Cigarettes  . Last attempt to quit: 12/30/1989  . Years since quitting: 28.0  Smokeless Tobacco Never Used    Goals Met:  Proper associated with RPD/PD & O2 Sat Independence with exercise equipment Improved SOB with ADL's Using PLB without cueing & demonstrates good technique Exercise tolerated well No report of cardiac concerns or symptoms Strength training completed today  Goals Unmet:  Not Applicable  Comments: Check out 1145.   Dr. Edward Hawkins is Medical Director for [place] [facility]. 

## 2018-01-16 ENCOUNTER — Other Ambulatory Visit: Payer: Self-pay | Admitting: Rheumatology

## 2018-01-16 NOTE — Telephone Encounter (Signed)
Last Visit: 12/18/17 Next visit: 03/18/18  Okay to refill per Dr. Estanislado Pandy

## 2018-01-20 ENCOUNTER — Encounter (HOSPITAL_COMMUNITY)
Admission: RE | Admit: 2018-01-20 | Discharge: 2018-01-20 | Disposition: A | Discharge status: Home / Self Care | Payer: Medicare Other | Source: Ambulatory Visit | Attending: Internal Medicine | Admitting: Internal Medicine

## 2018-01-20 DIAGNOSIS — R0600 Dyspnea, unspecified: Secondary | ICD-10-CM | POA: Diagnosis not present

## 2018-01-20 NOTE — Progress Notes (Signed)
Daily Session Note  Patient Details  Name: [name] MRN: [id number] Date of Birth: August 26, 1951 Referring Provider:     PULMONARY REHAB OTHER RESP ORIENTATION from 01/01/2018 in [place]  Referring Provider  Dr. Chase Caller      Encounter Date: 01/20/2018  Check In: Session Check In - 01/20/18 1045      Check-In   Location  [facility]    Staff Present  Luisangel Wainright Angelina Pih, MS, EP, [facility], Exercise Physiologist;Gregory Luther Parody, BS, EP, Exercise Physiologist    Supervising physician immediately available to respond to emergencies  See telemetry face sheet for immediately available MD    Medication changes reported      No    Fall or balance concerns reported     No    Tobacco Cessation  No Change    Warm-up and Cool-down  Performed as group-led instruction    Resistance Training Performed  Yes    VAD Patient?  No      Pain Assessment   Currently in Pain?  No/denies    Pain Score  0-No pain    Multiple Pain Sites  No       Capillary Blood Glucose: No results found for this or any previous visit (from the past 24 hour(s)).    Social History   Tobacco Use  Smoking Status Former Smoker  . Packs/day: 3.00  . Years: 20.00  . Pack years: 60.00  . Types: Cigarettes  . Last attempt to quit: 12/30/1989  . Years since quitting: 28.0  Smokeless Tobacco Never Used    Goals Met:  Proper associated with RPD/PD & O2 Sat Independence with exercise equipment Improved SOB with ADL's Exercise tolerated well No report of cardiac concerns or symptoms Strength training completed today  Goals Unmet:  Not Applicable  Comments: Check out: 1145   Dr. Sinda Du is Medical Director for [facility].

## 2018-01-22 ENCOUNTER — Encounter (HOSPITAL_COMMUNITY)
Admission: RE | Admit: 2018-01-22 | Discharge: 2018-01-22 | Disposition: A | Discharge status: Home / Self Care | Payer: Medicare Other | Source: Ambulatory Visit | Attending: Internal Medicine | Admitting: Internal Medicine

## 2018-01-22 ENCOUNTER — Other Ambulatory Visit: Payer: Self-pay | Admitting: Rheumatology

## 2018-01-22 DIAGNOSIS — R0609 Other forms of dyspnea: Principal | ICD-10-CM

## 2018-01-22 DIAGNOSIS — R0600 Dyspnea, unspecified: Secondary | ICD-10-CM | POA: Diagnosis not present

## 2018-01-22 NOTE — Progress Notes (Signed)
Daily Session Note  Patient Details  Name: [name] MRN: [id number] Date of Birth: [birth year] Referring Provider:     PULMONARY REHAB OTHER RESP ORIENTATION from 01/01/2018 in [place]  Referring Provider  Dr. Chase Caller      Encounter Date: 01/22/2018  Check In: Session Check In - 01/22/18 1045      Check-In   Location  [facility]    Staff Present  [name], MS, EP, [facility], Exercise Physiologist;Gregory Luther Parody, BS, EP, Exercise Physiologist    Supervising physician immediately available to respond to emergencies  See telemetry face sheet for immediately available MD    Medication changes reported      No    Fall or balance concerns reported     No    Warm-up and Cool-down  Performed as group-led instruction    Resistance Training Performed  Yes    VAD Patient?  No      Pain Assessment   Currently in Pain?  No/denies    Pain Score  0-No pain    Multiple Pain Sites  No       Capillary Blood Glucose: No results found for this or any previous visit (from the past 24 hour(s)).    Social History   Tobacco Use  Smoking Status Former Smoker  . Packs/day: 3.00  . Years: 20.00  . Pack years: 60.00  . Types: Cigarettes  . Last attempt to quit: 12/30/1989  . Years since quitting: 28.0  Smokeless Tobacco Never Used    Goals Met:  Proper associated with RPD/PD & O2 Sat Independence with exercise equipment Improved SOB with ADL's Using PLB without cueing & demonstrates good technique Exercise tolerated well No report of cardiac concerns or symptoms Strength training completed today  Goals Unmet:  Not Applicable  Comments: Check out 1145.   Dr. Sinda Du is Medical Director for [facility].

## 2018-01-23 ENCOUNTER — Other Ambulatory Visit: Payer: Self-pay | Admitting: *Deleted

## 2018-01-23 ENCOUNTER — Other Ambulatory Visit (HOSPITAL_COMMUNITY): Payer: Self-pay | Admitting: *Deleted

## 2018-01-23 NOTE — Progress Notes (Signed)
Infusion orders are current for patient CBC CMP Tylenol Benadryl appointments are up to date and follow up appointment  Is scheduled. Patient due for TB Gold.

## 2018-01-26 ENCOUNTER — Ambulatory Visit (HOSPITAL_COMMUNITY)
Admission: RE | Admit: 2018-01-26 | Discharge: 2018-01-26 | Disposition: A | Discharge status: Home / Self Care | Payer: Medicare Other | Source: Ambulatory Visit | Attending: Rheumatology | Admitting: Rheumatology

## 2018-01-26 DIAGNOSIS — M0579 Rheumatoid arthritis with rheumatoid factor of multiple sites without organ or systems involvement: Secondary | ICD-10-CM | POA: Diagnosis not present

## 2018-01-26 MED ORDER — ACETAMINOPHEN 325 MG PO TABS: 650.0000 mg | ORAL_TABLET | ORAL | Status: DC

## 2018-01-26 MED ORDER — DIPHENHYDRAMINE HCL 25 MG PO CAPS: 25.0000 mg | ORAL_CAPSULE | ORAL | Status: DC

## 2018-01-26 MED ORDER — TOCILIZUMAB 400 MG/20ML IV SOLN
400.0000 mg | INTRAVENOUS | Status: DC
  Administered 2018-01-26: 400 mg via INTRAVENOUS
  Filled 2018-01-26: qty 20

## 2018-01-26 MED ORDER — SODIUM CHLORIDE 0.9 % IV SOLN
4.0000 mg/kg | INTRAVENOUS | Status: DC
  Filled 2018-01-26: qty 20.3

## 2018-01-27 ENCOUNTER — Encounter (HOSPITAL_COMMUNITY)
Admission: RE | Admit: 2018-01-27 | Discharge: 2018-01-27 | Disposition: A | Discharge status: Home / Self Care | Payer: Medicare Other | Source: Ambulatory Visit | Attending: Internal Medicine | Admitting: Internal Medicine

## 2018-01-27 DIAGNOSIS — R0600 Dyspnea, unspecified: Secondary | ICD-10-CM | POA: Diagnosis not present

## 2018-01-27 DIAGNOSIS — R0609 Other forms of dyspnea: Principal | ICD-10-CM

## 2018-01-27 NOTE — Progress Notes (Signed)
Daily Session Note  Patient Details  Name: [name] MRN: [id number] Date of Birth: 04/24/1951 Referring Provider:     PULMONARY REHAB OTHER RESP ORIENTATION from 01/01/2018 in [place] [facility]  Referring Provider  Dr. Ramaswamy      Encounter Date: 01/27/2018  Check In: Session Check In - 01/27/18 1036      Check-In   Location  [facility]    Staff Present  Diane Coad, MS, EP, [facility], Exercise Physiologist;Nichlos Kunzler, BS, EP, Exercise Physiologist    Supervising physician immediately available to respond to emergencies  See telemetry face sheet for immediately available MD    Medication changes reported      No    Fall or balance concerns reported     No    Warm-up and Cool-down  Performed as group-led instruction    Resistance Training Performed  Yes    VAD Patient?  No      Pain Assessment   Currently in Pain?  No/denies    Pain Score  0-No pain    Multiple Pain Sites  No       Capillary Blood Glucose: No results found for this or any previous visit (from the past 24 hour(s)).    Social History   Tobacco Use  Smoking Status Former Smoker  . Packs/day: 3.00  . Years: 20.00  . Pack years: 60.00  . Types: Cigarettes  . Last attempt to quit: 12/30/1989  . Years since quitting: 28.0  Smokeless Tobacco Never Used    Goals Met:  Independence with exercise equipment Improved SOB with ADL's Using PLB without cueing & demonstrates good technique Exercise tolerated well No report of cardiac concerns or symptoms Strength training completed today  Goals Unmet:  Not Applicable  Comments: Check out 1145   Dr. Edward Hawkins is Medical Director for [facility] [place]. 

## 2018-01-28 LAB — QUANTIFERON-TB GOLD PLUS (RQFGPL)
QuantiFERON Mitogen Value: 0.47 IU/mL
QuantiFERON Nil Value: 0.4 IU/mL
QuantiFERON TB1 Ag Value: 0.33 IU/mL
QuantiFERON TB2 Ag Value: 0.32 IU/mL

## 2018-01-28 LAB — QUANTIFERON-TB GOLD PLUS: QuantiFERON-TB Gold Plus: UNDETERMINED

## 2018-01-29 ENCOUNTER — Encounter (HOSPITAL_COMMUNITY)
Admission: RE | Admit: 2018-01-29 | Discharge: 2018-01-29 | Disposition: A | Discharge status: Home / Self Care | Payer: Medicare Other | Source: Ambulatory Visit | Attending: Internal Medicine | Admitting: Internal Medicine

## 2018-01-29 DIAGNOSIS — R0609 Other forms of dyspnea: Principal | ICD-10-CM

## 2018-01-29 DIAGNOSIS — R0600 Dyspnea, unspecified: Secondary | ICD-10-CM | POA: Diagnosis not present

## 2018-01-29 NOTE — Progress Notes (Signed)
Daily Session Note  Patient Details  Name: [name] MRN: [id number] Date of Birth: [birth year] Referring Provider:     PULMONARY REHAB OTHER RESP ORIENTATION from 01/01/2018 in [place]  Referring Provider  Dr. Chase Caller      Encounter Date: 01/29/2018  Check In: Session Check In - 01/29/18 1040      Check-In   Location  [facility]    Staff Present  Aundra Dubin, RN, BSN;Suzetta Timko Luther Parody, BS, EP, Exercise Physiologist    Supervising physician immediately available to respond to emergencies  See telemetry face sheet for immediately available MD    Medication changes reported      No    Fall or balance concerns reported     No    Warm-up and Cool-down  Performed as group-led instruction    Resistance Training Performed  Yes    VAD Patient?  No      Pain Assessment   Currently in Pain?  No/denies    Pain Score  0-No pain    Multiple Pain Sites  No       Capillary Blood Glucose: No results found for this or any previous visit (from the past 24 hour(s)).    Social History   Tobacco Use  Smoking Status Former Smoker  . Packs/day: 3.00  . Years: 20.00  . Pack years: 60.00  . Types: Cigarettes  . Last attempt to quit: 12/30/1989  . Years since quitting: 28.1  Smokeless Tobacco Never Used    Goals Met:  Independence with exercise equipment Improved SOB with ADL's Using PLB without cueing & demonstrates good technique Exercise tolerated well No report of cardiac concerns or symptoms Strength training completed today  Goals Unmet:  Not Applicable  Comments: Check out 1145   Dr. Sinda Du is Medical Director for [facility].

## 2018-02-03 ENCOUNTER — Encounter (HOSPITAL_COMMUNITY)
Admission: RE | Admit: 2018-02-03 | Discharge: 2018-02-03 | Disposition: A | Discharge status: Home / Self Care | Payer: Medicare Other | Source: Ambulatory Visit | Attending: Internal Medicine | Admitting: Internal Medicine

## 2018-02-03 DIAGNOSIS — R0600 Dyspnea, unspecified: Secondary | ICD-10-CM | POA: Diagnosis not present

## 2018-02-03 NOTE — Progress Notes (Signed)
Daily Session Note  Patient Details  Name: [name] MRN: [id number] Date of Birth: 08/19/1951 Referring Provider:     PULMONARY REHAB OTHER RESP ORIENTATION from 01/01/2018 in [place]  Referring Provider  Dr. Chase Caller      Encounter Date: 02/03/2018  Check In: Session Check In - 02/03/18 1100      Check-In   Location  [facility]    Staff Present  Laurana Magistro Angelina Pih, MS, EP, [facility], Exercise Physiologist;Gregory Luther Parody, BS, EP, Exercise Physiologist    Supervising physician immediately available to respond to emergencies  See telemetry face sheet for immediately available MD    Medication changes reported      No    Fall or balance concerns reported     No    Tobacco Cessation  No Change    Warm-up and Cool-down  Performed as group-led instruction    Resistance Training Performed  Yes    VAD Patient?  No      Pain Assessment   Currently in Pain?  No/denies    Pain Score  0-No pain    Multiple Pain Sites  No       Capillary Blood Glucose: No results found for this or any previous visit (from the past 24 hour(s)).    Social History   Tobacco Use  Smoking Status Former Smoker  . Packs/day: 3.00  . Years: 20.00  . Pack years: 60.00  . Types: Cigarettes  . Last attempt to quit: 12/30/1989  . Years since quitting: 28.1  Smokeless Tobacco Never Used    Goals Met:  Proper associated with RPD/PD & O2 Sat Improved SOB with ADL's Using PLB without cueing & demonstrates good technique Exercise tolerated well No report of cardiac concerns or symptoms Strength training completed today  Goals Unmet:  Not Applicable  Comments: Check out 1200   Dr. Sinda Du is Medical Director for [facility].

## 2018-02-05 ENCOUNTER — Encounter (HOSPITAL_COMMUNITY)
Admission: RE | Admit: 2018-02-05 | Discharge: 2018-02-05 | Disposition: A | Discharge status: Home / Self Care | Payer: Medicare Other | Source: Ambulatory Visit | Attending: Internal Medicine | Admitting: Internal Medicine

## 2018-02-05 DIAGNOSIS — R0600 Dyspnea, unspecified: Secondary | ICD-10-CM | POA: Diagnosis not present

## 2018-02-05 DIAGNOSIS — R0609 Other forms of dyspnea: Principal | ICD-10-CM

## 2018-02-05 NOTE — Progress Notes (Signed)
Daily Session Note  Patient Details  Name: DARA CAMARGO MRN: [id number] Date of Birth: 1951-02-27 Referring Provider:     PULMONARY REHAB OTHER RESP ORIENTATION from 01/01/2018 in [place]  Referring Provider  Dr. Chase Caller      Encounter Date: 02/05/2018  Check In: Session Check In - 02/05/18 1045      Check-In   [facility]    Staff Present  [name], MS, EP, [facility], Exercise Physiologist;Gregory Luther Parody, BS, EP, Exercise Physiologist    Supervising physician immediately available to respond to emergencies  See telemetry face sheet for immediately available MD    Medication changes reported      No    Fall or balance concerns reported     No    Warm-up and Cool-down  Performed as group-led instruction    Resistance Training Performed  Yes    VAD Patient?  No      Pain Assessment   Currently in Pain?  No/denies    Pain Score  0-No pain    Multiple Pain Sites  No       Capillary Blood Glucose: No results found for this or any previous visit (from the past 24 hour(s)).    Social History   Tobacco Use  Smoking Status Former Smoker  . Packs/day: 3.00  . Years: 20.00  . Pack years: 60.00  . Types: Cigarettes  . Last attempt to quit: 12/30/1989  . Years since quitting: 28.1  Smokeless Tobacco Never Used    Goals Met:  Proper associated with RPD/PD & O2 Sat Independence with exercise equipment Improved SOB with ADL's Using PLB without cueing & demonstrates good technique Exercise tolerated well No report of cardiac concerns or symptoms Strength training completed today  Goals Unmet:  Not Applicable  Comments: Check out 1145.   Dr. Sinda Du is Medical Director for [facility].

## 2018-02-06 ENCOUNTER — Other Ambulatory Visit: Payer: Self-pay | Admitting: *Deleted

## 2018-02-06 NOTE — Progress Notes (Signed)
Infusion orders are current for patient CBC CMP Tylenol Benadryl appointments are up to date and follow up appointment  is scheduled TB gold not due yet.  

## 2018-02-10 ENCOUNTER — Encounter (HOSPITAL_COMMUNITY): Payer: Medicare Other

## 2018-02-11 NOTE — Progress Notes (Signed)
Pulmonary Individual Treatment Plan  Patient Details  Name: [name] MRN: [id number] Date of Birth: 1951-04-13 Referring Provider:     PULMONARY REHAB OTHER RESP ORIENTATION from 01/01/2018 in [place]  Referring Provider  Dr. Chase Caller      Initial Encounter Date:    [place] from 01/01/2018 in [place]  Date  01/01/18  Referring Provider  Dr. Chase Caller      Visit Diagnosis: Dyspnea on exertion  Patient's Home Medications on Admission:   Current Outpatient Medications:  .  acetaminophen (TYLENOL) 500 MG tablet, Take 1,000 mg by mouth every 6 (six) hours as needed (arthritis pain)., Disp: , Rfl:  .  albuterol (PROVENTIL) (2.5 MG/3ML) 0.083% nebulizer solution, Take 3 mLs (2.5 mg total) by nebulization every 6 (six) hours as needed for wheezing or shortness of breath., Disp: 75 mL, Rfl: 12 .  albuterol (PROVENTIL) 2 MG tablet, Take 2 mg by mouth 2 (two) times daily., Disp: , Rfl:  .  alendronate (FOSAMAX) 70 MG tablet, Take 1 tablet (70 mg total) by mouth once a week. Take with a full glass of water on an empty stomach., Disp: 12 tablet, Rfl: 1 .  allopurinol (ZYLOPRIM) 100 MG tablet, TAKE 1 TABLET BY MOUTH TWICE A DAY, Disp: 180 tablet, Rfl: 1 .  Calcium-Phosphorus-Vitamin D (CITRACAL +D3 PO), Take 2 tablets by mouth 2 (two) times daily. , Disp: , Rfl:  .  dicyclomine (BENTYL) 10 MG capsule, TAKE 1 CAPSULE BY MOUTH 4 TIMES DAILY BEFORE MEALS AND AT BEDTIME (Patient taking differently: TAKE 2 CAPSULE BY MOUTH TWO TIMES DAILY), Disp: 120 capsule, Rfl: 0 .  doxycycline (VIBRA-TABS) 100 MG tablet, Take 100 mg by mouth 2 (two) times daily. For ten days.  Started 12/31/2017., Disp: , Rfl:  .  fluticasone furoate-vilanterol (BREO ELLIPTA) 100-25 MCG/INH AEPB, Inhale 1 puff into the lungs daily., Disp: 1 each, Rfl: 4 .  folic acid (FOLVITE) 1 MG tablet, TAKE 2 TABLETS BY MOUTH EVERY MORNING, Disp: 180 tablet,  Rfl: 4 .  furosemide (LASIX) 40 MG tablet, TAKE 1 TABLET BY MOUTH TWO TIMES DAILY (Patient taking differently: TAKE 2 TABLETS BY MOUTH (Evenings)), Disp: 60 tablet, Rfl: 8 .  leflunomide (ARAVA) 20 MG tablet, TAKE 1 TABLET BY MOUTH EVERY DAY, Disp: 30 tablet, Rfl: 2 .  omeprazole (PRILOSEC) 40 MG capsule, Take 40 mg by mouth 2 (two) times daily. , Disp: , Rfl: 2 .  Oxymetazoline HCl (NASAL SPRAY NA), Place 2 sprays into the nose daily as needed (allergies)., Disp: , Rfl:  .  phenytoin (DILANTIN) 100 MG ER capsule, Take 100-200 mg by mouth 2 (two) times daily. Take one capsule in the morning and 2 capsules at bedtime, Disp: , Rfl:  .  predniSONE (DELTASONE) 1 MG tablet, Take 4 tablets (4 mg total) by mouth daily with breakfast. Take with 5 mg tablet / 9 mg in Nov, 8mg  in Dec, 7mg  in [place] by 1 mg per month, Disp: 270 tablet, Rfl: 1 .  predniSONE (DELTASONE) 5 MG tablet, TAKE 1 TABLET BY MOUTH EVERY DAY WITH BREAKFAST, Disp: 30 tablet, Rfl: 0 .  PROAIR HFA 108 (90 Base) MCG/ACT inhaler, Inhale 1-2 puffs into the lungs every 6 (six) hours as needed. , Disp: , Rfl:  .  Tocilizumab (ACTEMRA IV), Inject into the vein every 28 (twenty-eight) days. For Rheumatoid Arthritis, Disp: , Rfl:  .  umeclidinium bromide (INCRUSE ELLIPTA) 62.5 MCG/INH AEPB, Inhale 1 puff into  the lungs daily., Disp: 3 each, Rfl: 1 .  warfarin (COUMADIN) 3 MG tablet, Take 3 mg by mouth at bedtime. , Disp: , Rfl:   Past Medical History: Past Medical History:  Diagnosis Date  . Anxiety    hx of   . Arthritis   . Asthma   . Clotting disorder (HCC)    DVT both legs   . COPD (chronic obstructive pulmonary disease) ([place])   . DDD (degenerative disc disease), cervical    with UE's paresthesias  . Diverticulosis   . DVT, lower extremity ([place])    bilat  . 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 GERD (gastroesophageal reflux disease)   . Gout   . Heart murmur   .  History of measles   . History of shingles   . Hypercholesterolemia   . IBS (irritable bowel syndrome)   . IBS (irritable bowel syndrome)   . Peripheral edema   . Pneumonia    hx of   . Prostate cancer ([place])   . Seizures ([place])    last seizure 20 years ago   . Shortness of breath dyspnea    exertion   . Sleep apnea    not on cpap  . Tubular adenoma of colon 04/2008    Tobacco Use: Social History   Tobacco Use  Smoking Status Former Smoker  . Packs/day: 3.00  . Years: 20.00  . Pack years: 60.00  . Types: Cigarettes  . Last attempt to quit: 12/30/1989  . Years since quitting: 28.1  Smokeless Tobacco Never Used    Labs: Recent Review Scientist, physiological    Labs for ITP Cardiac and Pulmonary Rehab Latest Ref Rng & Units 07/01/2010   TCO2 0 - 100 mmol/L 27      Capillary Blood Glucose: Lab Results  Component Value Date   GLUCAP 101 (H) 11/28/2008     Pulmonary Assessment Scores: Pulmonary Assessment Scores    Row Name 01/01/18 1017         ADL UCSD   ADL Phase  Entry     SOB Score total  18     Rest  0     Walk  2     Stairs  2     Bath  1     Dress  1     Shop  1       CAT Score   CAT Score  10       mMRC Score   mMRC Score  2        Pulmonary Function Assessment: Pulmonary Function Assessment - 01/01/18 1013      Pulmonary Function Tests   FVC%  49 %    FEV1%  54 %    FEV1/FVC Ratio  110    DLCO%  63 %      Initial Spirometry Results   FVC%  49 %    FEV1%  54 %    FEV1/FVC Ratio  110      Post Bronchodilator Spirometry Results   FVC%  48 %    FEV1%  54 %    FEV1/FVC Ratio  113      Breath   Bilateral Breath Sounds  Clear    Shortness of Breath  No       Exercise Target Goals:    Exercise Program Goal: Individual exercise prescription set using results from initial 6 min  walk test and THRR while considering  patient's activity barriers and safety.   Exercise Prescription Goal: Initial exercise prescription builds to 30-45 minutes  a day of aerobic activity, 2-3 days per week.  Home exercise guidelines will be given to patient during program as part of exercise prescription that the participant will acknowledge.  Activity Barriers & Risk Stratification: Activity Barriers & Cardiac Risk Stratification - 01/01/18 0936      Activity Barriers & Cardiac Risk Stratification   Activity Barriers  Back Problems;Deconditioning    Cardiac Risk Stratification  Low       6 Minute Walk: 6 Minute Walk    Row Name 01/01/18 0932         6 Minute Walk   Phase  Initial     Distance  1200 feet     Distance % Change  0 %     Distance Feet Change  0 ft     Walk Time  6 minutes     # of Rest Breaks  0     MPH  2.27     METS  2.74     RPE  9     Perceived Dyspnea   9     VO2 Peak  9.54     Symptoms  No     Resting HR  84 bpm     Resting BP  136/67     Resting Oxygen Saturation   94 %     Exercise Oxygen Saturation  during 6 min walk  91 %     Max Ex. HR  105 bpm     Max Ex. BP  146/74     2 Minute Post BP  136/66        Oxygen Initial Assessment: Oxygen Initial Assessment - 01/01/18 1023      Home Oxygen   Home Oxygen Device  None    Sleep Oxygen Prescription  None    Home Exercise Oxygen Prescription  None    Home at Rest Exercise Oxygen Prescription  None      Initial 6 min Walk   Oxygen Used  None      Program Oxygen Prescription   Program Oxygen Prescription  None      Intervention   Short Term Goals  To learn and understand importance of monitoring SPO2 with pulse oximeter and demonstrate accurate use of the pulse oximeter.;To learn and understand importance of maintaining oxygen saturations>88%;To learn and demonstrate proper pursed lip breathing techniques or other breathing techniques.    Long  Term Goals  Verbalizes importance of monitoring SPO2 with pulse oximeter and return demonstration;Maintenance of O2 saturations>88%;Exhibits proper breathing techniques, such as pursed lip breathing or other  method taught during program session       Oxygen Re-Evaluation: Oxygen Re-Evaluation    Row Name 02/11/18 1342             Program Oxygen Prescription   Program Oxygen Prescription  None         Home Oxygen   Home Oxygen Device  None       Sleep Oxygen Prescription  None       Home Exercise Oxygen Prescription  None       Home at Rest Exercise Oxygen Prescription  None         Goals/Expected Outcomes   Short Term Goals  To learn and understand importance of monitoring SPO2 with pulse oximeter and demonstrate accurate use of the pulse oximeter.;To  learn and understand importance of maintaining oxygen saturations>88%;To learn and demonstrate proper pursed lip breathing techniques or other breathing techniques.       Long  Term Goals  Verbalizes importance of monitoring SPO2 with pulse oximeter and return demonstration;Maintenance of O2 saturations>88%;Exhibits proper breathing techniques, such as pursed lip breathing or other method taught during program session       Comments  Patient is meeting both short and long term goals.        Goals/Expected Outcomes  Patient will continue to meet both his short and long term goals.           Oxygen Discharge (Final Oxygen Re-Evaluation): Oxygen Re-Evaluation - 02/11/18 1342      Program Oxygen Prescription   Program Oxygen Prescription  None      Home Oxygen   Home Oxygen Device  None    Sleep Oxygen Prescription  None    Home Exercise Oxygen Prescription  None    Home at Rest Exercise Oxygen Prescription  None      Goals/Expected Outcomes   Short Term Goals  To learn and understand importance of monitoring SPO2 with pulse oximeter and demonstrate accurate use of the pulse oximeter.;To learn and understand importance of maintaining oxygen saturations>88%;To learn and demonstrate proper pursed lip breathing techniques or other breathing techniques.    Long  Term Goals  Verbalizes importance of monitoring SPO2 with pulse oximeter  and return demonstration;Maintenance of O2 saturations>88%;Exhibits proper breathing techniques, such as pursed lip breathing or other method taught during program session    Comments  Patient is meeting both short and long term goals.     Goals/Expected Outcomes  Patient will continue to meet both his short and long term goals.        Initial Exercise Prescription: Initial Exercise Prescription - 01/01/18 0900      Date of Initial Exercise RX and Referring Provider   Date  01/01/18    Referring Provider  Dr. Chase Caller      Treadmill   MPH  2    Grade  0    Minutes  15    METs  2.5      NuStep   Level  2    SPM  97    Minutes  20    METs  1.9      Prescription Details   Frequency (times per week)  2    Duration  Progress to 30 minutes of continuous aerobic without signs/symptoms of physical distress      Intensity   THRR 40-80% of Max Heartrate  [phone]    Ratings of Perceived Exertion  11-13    Perceived Dyspnea  0-4      Progression   Progression  Continue progressive overload as per policy without signs/symptoms or physical distress.      Resistance Training   Training Prescription  Yes    Weight  1    Reps  10-15       Perform Capillary Blood Glucose checks as needed.  Exercise Prescription Changes:  Exercise Prescription Changes    Row Name 01/08/18 1200 01/09/18 1400 01/27/18 1500 02/10/18 0700       Response to Exercise   Blood Pressure (Admit)  -  144/76  132/64  130/68    Blood Pressure (Exercise)  -  140/80  150/82  144/78    Blood Pressure (Exit)  -  138/70  132/76  130/70    Heart Rate (Admit)  -  80 bpm  77 bpm  68 bpm    Heart Rate (Exercise)  -  98 bpm  102 bpm  93 bpm    Heart Rate (Exit)  -  105 bpm  94 bpm  85 bpm    Oxygen Saturation (Admit)  -  93 %  93 %  93 %    Oxygen Saturation (Exercise)  -  92 %  91 %  92 %    Oxygen Saturation (Exit)  -  92 %  92 %  94 %    Rating of Perceived Exertion (Exercise)  -  11  11  11     Perceived  Dyspnea (Exercise)  -  11  11  11     Duration  -  Progress to 30 minutes of  aerobic without signs/symptoms of physical distress  Progress to 30 minutes of  aerobic without signs/symptoms of physical distress  Progress to 30 minutes of  aerobic without signs/symptoms of physical distress    Intensity  -  THRR New [phone]  THRR New [phone]  THRR New [phone]      Progression   Progression  -  Continue to progress workloads to maintain intensity without signs/symptoms of physical distress.  Continue to progress workloads to maintain intensity without signs/symptoms of physical distress.  Continue to progress workloads to maintain intensity without signs/symptoms of physical distress.      Resistance Training   Training Prescription  Yes  Yes  Yes  Yes    Weight  1  1  1  2     Reps  10-15  10-15  10-15  10-15      Treadmill   MPH  2  2  2.4  2.4    Grade  0  0  0  0    Minutes  15  15  15  15     METs  2.5  2.5  2.8  2.8      NuStep   Level  2  2  2  3     SPM  97  111  112  116    Minutes  20  20  20  20     METs  1.9  2.7  3.1  3.3      Home Exercise Plan   Plans to continue exercise at  Home (comment)  Home (comment)  Home (comment)  Home (comment)    Frequency  Add 2 additional days to program exercise sessions.  Add 2 additional days to program exercise sessions.  Add 2 additional days to program exercise sessions.  Add 2 additional days to program exercise sessions.    Initial Home Exercises Provided  01/08/17  01/08/17  01/08/17  01/08/17       Exercise Comments:  Exercise Comments    Row Name 01/08/18 1257 01/09/18 1431 01/27/18 1519 02/10/18 0747     Exercise Comments  Patient received the take home exercise plan today. THR was addressed as were safety guidelines for being active around the house. Patient addressed an understanding and was encouraged to ask any future questions as they arise.   Patient is doing well in PR and has increased his SPMs on the Nustep. He  has jsut started the program but will be progressed more in time. We will help him reach his goals of breathing better and getting back into better shape.   Patient is doing well in CR. He has progressed on the treadmill with a higher speed. He  has also maintained his high SPMs on the Nustep with his level   Patient continues to do well in PR. He has increased his level on the Nustep to level three and has maintained his speed on the treadmill as well. Patient states that he feels stronger throughout the program and has stated he can do more when not in the program.        Exercise Goals and Review:  Exercise Goals    Row Name 01/01/18 0939             Exercise Goals   Increase Physical Activity  Yes       Intervention  Provide advice, education, support and counseling about physical activity/exercise needs.;Develop an individualized exercise prescription for aerobic and resistive training based on initial evaluation findings, risk stratification, comorbidities and participant's personal goals.       Expected Outcomes  Achievement of increased cardiorespiratory fitness and enhanced flexibility, muscular endurance and strength shown through measurements of functional capacity and personal statement of participant.       Increase Strength and Stamina  Yes       Intervention  Provide advice, education, support and counseling about physical activity/exercise needs.;Develop an individualized exercise prescription for aerobic and resistive training based on initial evaluation findings, risk stratification, comorbidities and participant's personal goals.       Expected Outcomes  Achievement of increased cardiorespiratory fitness and enhanced flexibility, muscular endurance and strength shown through measurements of functional capacity and personal statement of participant.       Able to understand and use rate of perceived exertion (RPE) scale  Yes       Intervention  Provide education and explanation  on how to use RPE scale       Expected Outcomes  Short Term: Able to use RPE daily in rehab to express subjective intensity level;Long Term:  Able to use RPE to guide intensity level when exercising independently       Able to understand and use Dyspnea scale  Yes       Intervention  Provide education and explanation on how to use Dyspnea scale       Expected Outcomes  Short Term: Able to use Dyspnea scale daily in rehab to express subjective sense of shortness of breath during exertion;Long Term: Able to use Dyspnea scale to guide intensity level when exercising independently       Knowledge and understanding of Target Heart Rate Range (THRR)  Yes       Intervention  Provide education and explanation of THRR including how the numbers were predicted and where they are located for reference       Expected Outcomes  Short Term: Able to state/look up THRR;Long Term: Able to use THRR to govern intensity when exercising independently;Short Term: Able to use daily as guideline for intensity in rehab       Able to check pulse independently  Yes       Intervention  Provide education and demonstration on how to check pulse in carotid and radial arteries.;Review the importance of being able to check your own pulse for safety during independent exercise       Expected Outcomes  Short Term: Able to explain why pulse checking is important during independent exercise;Long Term: Able to check pulse independently and accurately       Understanding of Exercise Prescription  Yes       Intervention  Provide education, explanation, and written materials on patient's individual exercise  prescription       Expected Outcomes  Short Term: Able to explain program exercise prescription;Long Term: Able to explain home exercise prescription to exercise independently          Exercise Goals Re-Evaluation : Exercise Goals Re-Evaluation    Row Name 01/09/18 1429 02/10/18 0745           Exercise Goal Re-Evaluation    Exercise Goals Review  Increase Physical Activity;Increase Strength and Stamina;Knowledge and understanding of Target Heart Rate Range (THRR)  Increase Physical Activity;Increase Strength and Stamina;Knowledge and understanding of Target Heart Rate Range (THRR)      Comments  Patient is doing well in PR and has increased his SPMs on the Nustep. He has jsut started the program but will be progressed more in time. We will help him reach his goals of breathing better and getting back into better shape.   Patient continues to do well in PR. He has increased his level on the Nustep to level three and has maintained his speed on the treadmill as well. Patient states that he feels stronger throughout the program and has stated he can do more when not in the program.       Expected Outcomes  Patient wishes to breathe better and to get back into better shape  Patient wishes to be able to breathe better and to get back into better shape.          Discharge Exercise Prescription (Final Exercise Prescription Changes): Exercise Prescription Changes - 02/10/18 0700      Response to Exercise   Blood Pressure (Admit)  130/68    Blood Pressure (Exercise)  144/78    Blood Pressure (Exit)  130/70    Heart Rate (Admit)  68 bpm    Heart Rate (Exercise)  93 bpm    Heart Rate (Exit)  85 bpm    Oxygen Saturation (Admit)  93 %    Oxygen Saturation (Exercise)  92 %    Oxygen Saturation (Exit)  94 %    Rating of Perceived Exertion (Exercise)  11    Perceived Dyspnea (Exercise)  11    Duration  Progress to 30 minutes of  aerobic without signs/symptoms of physical distress    Intensity  THRR New [phone]      Progression   Progression  Continue to progress workloads to maintain intensity without signs/symptoms of physical distress.      Resistance Training   Training Prescription  Yes    Weight  2    Reps  10-15      Treadmill   MPH  2.4    Grade  0    Minutes  15    METs  2.8      NuStep   Level  3     SPM  116    Minutes  20    METs  3.3      Home Exercise Plan   Plans to continue exercise at  Home (comment)    Frequency  Add 2 additional days to program exercise sessions.    Initial Home Exercises Provided  01/08/17       Nutrition:  Target Goals: Understanding of nutrition guidelines, daily intake of sodium 1500mg , cholesterol 200mg , calories 30% from fat and 7% or less from saturated fats, daily to have 5 or more servings of fruits and vegetables.  Biometrics: Pre Biometrics - 01/01/18 0939      Pre Biometrics   Height  5\' 9"  (1.753  m)    Weight  227 lb 9.6 oz (103.2 kg)    Waist Circumference  43.5 inches    Hip Circumference  42 inches    Waist to Hip Ratio  1.04 %    BMI (Calculated)  33.6    Triceps Skinfold  16 mm    % Body Fat  31.5 %    Grip Strength  63.67 kg    Flexibility  0 in    Single Leg Stand  7 seconds        Nutrition Therapy Plan and Nutrition Goals: Nutrition Therapy & Goals - 02/11/18 1342      Nutrition Therapy   RD appointment deferred  Yes      Personal Nutrition Goals   Personal Goal #2  Eats a heart healthy diet       Nutrition Assessments: Nutrition Assessments - 01/01/18 1044      MEDFICTS Scores   Pre Score  53       Nutrition Goals Re-Evaluation:   Nutrition Goals Discharge (Final Nutrition Goals Re-Evaluation):   Psychosocial: Target Goals: Acknowledge presence or absence of significant depression and/or stress, maximize coping skills, provide positive support system. Participant is able to verbalize types and ability to use techniques and skills needed for reducing stress and depression.  Initial Review & Psychosocial Screening: Initial Psych Review & Screening - 01/01/18 1053      Initial Review   Current issues with  None Identified      Family Dynamics   Good Support System?  Yes      Barriers   Psychosocial barriers to participate in program  There are no identifiable barriers or psychosocial needs.       Screening Interventions   Interventions  Encouraged to exercise       Quality of Life Scores: Quality of Life - 01/01/18 0940      Quality of Life Scores   Health/Function Pre  20.67 %    Socioeconomic Pre  28.8 %    Psych/Spiritual Pre  26.57 %    Family Pre  28.8 %    GLOBAL Pre  24.5 %      Scores of 19 and below usually indicate a poorer quality of life in these areas.  A difference of  2-3 points is a clinically meaningful difference.  A difference of 2-3 points in the total score of the Quality of Life Index has been associated with significant improvement in overall quality of life, self-image, physical symptoms, and general health in studies assessing change in quality of life.   PHQ-9: Recent Review Flowsheet Data    Depression screen [facility] 2/9 01/01/2018 02/04/2017 10/09/2016   Decreased Interest 0 0 0   Down, Depressed, Hopeless 0 0 0   PHQ - 2 Score 0 0 0   Altered sleeping 1 - -   Tired, decreased energy 1 - -   Change in appetite 0 - -   Feeling bad or failure about yourself  0 - -   Trouble concentrating 0 - -   Moving slowly or fidgety/restless 0 - -   Suicidal thoughts 0 - -   PHQ-9 Score 2 - -   Difficult doing work/chores Somewhat difficult - -     Interpretation of Total Score  Total Score Depression Severity:  1-4 = Minimal depression, 5-9 = Mild depression, 10-14 = Moderate depression, 15-19 = Moderately severe depression, 20-27 = Severe depression   Psychosocial Evaluation and Intervention: Psychosocial Evaluation -  01/01/18 1054      Psychosocial Evaluation & Interventions   Interventions  Encouraged to exercise with the program and follow exercise prescription    Continue Psychosocial Services   No Follow up required       Psychosocial Re-Evaluation: Psychosocial Re-Evaluation    [place] Name 02/11/18 1348             Psychosocial Re-Evaluation   Current issues with  None Identified       Comments  Patient's initial QOL score was 24.50 and  his PHQ-9 score was 2.        Expected Outcomes  Patient will have no psychosocial issues identified at discharge.        Interventions  Stress management education;Encouraged to attend Pulmonary Rehabilitation for the exercise;Relaxation education       Continue Psychosocial Services   No Follow up required          Psychosocial Discharge (Final Psychosocial Re-Evaluation): Psychosocial Re-Evaluation - 02/11/18 1348      Psychosocial Re-Evaluation   Current issues with  None Identified    Comments  Patient's initial QOL score was 24.50 and his PHQ-9 score was 2.     Expected Outcomes  Patient will have no psychosocial issues identified at discharge.     Interventions  Stress management education;Encouraged to attend Pulmonary Rehabilitation for the exercise;Relaxation education    Continue Psychosocial Services   No Follow up required        Education: Education Goals: Education classes will be provided on a weekly basis, covering required topics. Participant will state understanding/return demonstration of topics presented.  Learning Barriers/Preferences: Learning Barriers/Preferences - 01/01/18 1000      Learning Barriers/Preferences   Learning Barriers  Hearing    Learning Preferences  Group Instruction;Skilled Demonstration       Education Topics: How Lungs Work and Diseases: - Discuss the anatomy of the lungs and diseases that can affect the lungs, such as COPD.   PULMONARY REHAB OTHER RESPIRATORY from 02/05/2018 in [place]  Date  02/05/18  Educator  DJ  Instruction Review Code  2- Demonstrated Understanding      Exercise: -Discuss the importance of exercise, FITT principles of exercise, normal and abnormal responses to exercise, and how to exercise safely.   PULMONARY REHAB OTHER RESPIRATORY from 02/05/2018 in [place]  Date  01/29/18  Educator  [place]  Instruction Review Code  2- Demonstrated Understanding       Environmental Irritants: -Discuss types of environmental irritants and how to limit exposure to environmental irritants.   Meds/Inhalers and oxygen: - Discuss respiratory medications, definition of an inhaler and oxygen, and the proper way to use an inhaler and oxygen.   Energy Saving Techniques: - Discuss methods to conserve energy and decrease shortness of breath when performing activities of daily living.    Bronchial Hygiene / Breathing Techniques: - Discuss breathing mechanics, pursed-lip breathing technique,  proper posture, effective ways to clear airways, and other functional breathing techniques   Cleaning Equipment: - Provides group verbal and written instruction about the health risks of elevated stress, cause of high stress, and healthy ways to reduce stress.   Nutrition I: Fats: - Discuss the types of cholesterol, what cholesterol does to the body, and how cholesterol levels can be controlled.   Nutrition II: Labels: -Discuss the different components of food labels and how to read food labels.   Respiratory Infections: - Discuss the signs and symptoms of respiratory infections,  ways to prevent respiratory infections, and the importance of seeking medical treatment when having a respiratory infection.   Stress I: Signs and Symptoms: - Discuss the causes of stress, how stress may lead to anxiety and depression, and ways to limit stress.   PULMONARY REHAB OTHER RESPIRATORY from 02/05/2018 in [place]  Date  01/08/18  Educator  Marya Amsler C.  Instruction Review Code  2- Demonstrated Understanding      Stress II: Relaxation: -Discuss relaxation techniques to limit stress.   Oxygen for Home/Travel: - Discuss how to prepare for travel when on oxygen and proper ways to transport and store oxygen to ensure safety.   PULMONARY REHAB OTHER RESPIRATORY from 02/05/2018 in [place]  Date  01/22/18  Educator  DJ   Instruction Review Code  2- Demonstrated Understanding      Knowledge Questionnaire Score: Knowledge Questionnaire Score - 01/01/18 1012      Knowledge Questionnaire Score   Pre Score  14/18       Core Components/Risk Factors/Patient Goals at Admission: Personal Goals and Risk Factors at Admission - 01/01/18 1048      Core Components/Risk Factors/Patient Goals on Admission    Weight Management  Yes    Intervention  Weight Management/Obesity: Establish reasonable short term and long term weight goals.    Admit Weight  227 lb 9.6 oz (103.2 kg)    Goal Weight: Short Term  217 lb 9.6 oz (98.7 kg)    Goal Weight: Long Term  207 lb 9.6 oz (94.2 kg)    Expected Outcomes  Short Term: Continue to assess and modify interventions until short term weight is achieved;Long Term: Adherence to nutrition and physical activity/exercise program aimed toward attainment of established weight goal    Personal Goal Other  Yes    Personal Goal  Breathe better, lose 45lbs, get back in physical shape    Intervention  Attend program 2 x week and supplement with home exercise 3 x week.     Expected Outcomes  Attain personal goals.        Core Components/Risk Factors/Patient Goals Review:  Goals and Risk Factor Review    Row Name 02/11/18 1344             Core Components/Risk Factors/Patient Goals Review   Personal Goals Review  Weight Management/Obesity;Improve shortness of breath with ADL's Breathe better; be in better shape; lose 45 lbs long term; play golf and visit again.        Review  Patient has completed 11 sessions losing 4 lbs. He is doing well in the program with progression. He says he is getting less SOB with activities and says he had noted less hoarsness when he talks. He feels better overall and is stronger. He played golf last week without difficulty and is able to visit some again. Will continue to monitor.        Expected Outcomes  Patient will continue to attend sessions and  complete the program meeting his personal goals.           Core Components/Risk Factors/Patient Goals at Discharge (Final Review):  Goals and Risk Factor Review - 02/11/18 1344      Core Components/Risk Factors/Patient Goals Review   Personal Goals Review  Weight Management/Obesity;Improve shortness of breath with ADL's Breathe better; be in better shape; lose 45 lbs long term; play golf and visit again.     Review  Patient has completed 11 sessions losing 4 lbs. He  is doing well in the program with progression. He says he is getting less SOB with activities and says he had noted less hoarsness when he talks. He feels better overall and is stronger. He played golf last week without difficulty and is able to visit some again. Will continue to monitor.     Expected Outcomes  Patient will continue to attend sessions and complete the program meeting his personal goals.        ITP Comments: ITP Comments    Row Name 01/12/18 1254           ITP Comments  Patient new to program completing 3 sessions. Will continue to monitor.           Comments: ITP 30 Day REVIEW Pt is making expected progress toward pulmonary rehab goals after completing 11 sessions. Recommend continued exercise, life style modification, education, and utilization of breathing techniques to increase stamina and strength and decrease shortness of breath with exertion.

## 2018-02-12 ENCOUNTER — Encounter (HOSPITAL_COMMUNITY)
Admission: RE | Admit: 2018-02-12 | Discharge: 2018-02-12 | Disposition: A | Discharge status: Home / Self Care | Payer: Medicare Other | Source: Ambulatory Visit | Attending: Internal Medicine | Admitting: Internal Medicine

## 2018-02-12 DIAGNOSIS — R0600 Dyspnea, unspecified: Secondary | ICD-10-CM

## 2018-02-12 DIAGNOSIS — R0609 Other forms of dyspnea: Principal | ICD-10-CM

## 2018-02-12 NOTE — Progress Notes (Signed)
Daily Session Note  Patient Details  Name: [name] MRN: [id number] Date of Birth: 10/04/1951 Referring Provider:     PULMONARY REHAB OTHER RESP ORIENTATION from 01/01/2018 in [place]  Referring Provider  Dr. Chase Caller      Encounter Date: 02/12/2018  Check In: Session Check In - 02/12/18 1030      Check-In   Location  [facility]    Staff Present  [name], MS, EP, [facility], Exercise Physiologist;Odie Edmonds Luther Parody, BS, EP, Exercise Physiologist    Supervising physician immediately available to respond to emergencies  See telemetry face sheet for immediately available MD    Medication changes reported      No    Fall or balance concerns reported     No    Tobacco Cessation  No Change    Warm-up and Cool-down  Performed as group-led instruction    Resistance Training Performed  Yes    VAD Patient?  No      Pain Assessment   Currently in Pain?  No/denies    Pain Score  0-No pain    Multiple Pain Sites  No       Capillary Blood Glucose: No results found for this or any previous visit (from the past 24 hour(s)).    Social History   Tobacco Use  Smoking Status Former Smoker  . Packs/day: 3.00  . Years: 20.00  . Pack years: 60.00  . Types: Cigarettes  . Last attempt to quit: 12/30/1989  . Years since quitting: 28.1  Smokeless Tobacco Never Used    Goals Met:  Independence with exercise equipment Improved SOB with ADL's Using PLB without cueing & demonstrates good technique Exercise tolerated well No report of cardiac concerns or symptoms Strength training completed today  Goals Unmet:  Not Applicable  Comments: Check out 1145   Dr. Sinda Du is Medical Director for [facility] Pulmonary Re[facility].

## 2018-02-17 ENCOUNTER — Encounter (HOSPITAL_COMMUNITY)
Admission: RE | Admit: 2018-02-17 | Discharge: 2018-02-17 | Disposition: A | Discharge status: Home / Self Care | Payer: Medicare Other | Source: Ambulatory Visit | Attending: Internal Medicine | Admitting: Internal Medicine

## 2018-02-17 ENCOUNTER — Other Ambulatory Visit: Payer: Self-pay | Admitting: Rheumatology

## 2018-02-17 DIAGNOSIS — R0609 Other forms of dyspnea: Principal | ICD-10-CM

## 2018-02-17 DIAGNOSIS — R0600 Dyspnea, unspecified: Secondary | ICD-10-CM | POA: Diagnosis not present

## 2018-02-17 NOTE — Progress Notes (Signed)
Daily Session Note  Patient Details  Name: [name] MRN: [id number] Date of Birth: May 30, 1951 Referring Provider:     PULMONARY REHAB OTHER RESP ORIENTATION from 01/01/2018 in [place]  Referring Provider  Dr. Chase Caller      Encounter Date: 02/17/2018  Check In: Session Check In - 02/17/18 1047      Check-In   Location  [facility]    Staff Present  [name], MS, EP, [facility], Exercise Physiologist;Sheilia Reznick Luther Parody, BS, EP, Exercise Physiologist    Supervising physician immediately available to respond to emergencies  See telemetry face sheet for immediately available MD    Medication changes reported      No    Fall or balance concerns reported     No    Tobacco Cessation  No Change    Warm-up and Cool-down  Performed as group-led instruction    Resistance Training Performed  Yes    VAD Patient?  No      Pain Assessment   Currently in Pain?  No/denies    Pain Score  0-No pain    Multiple Pain Sites  No       Capillary Blood Glucose: No results found for this or any previous visit (from the past 24 hour(s)).    Social History   Tobacco Use  Smoking Status Former Smoker  . Packs/day: 3.00  . Years: 20.00  . Pack years: 60.00  . Types: Cigarettes  . Last attempt to quit: 12/30/1989  . Years since quitting: 28.1  Smokeless Tobacco Never Used    Goals Met:  Independence with exercise equipment Improved SOB with ADL's Using PLB without cueing & demonstrates good technique Exercise tolerated well No report of cardiac concerns or symptoms Strength training completed today  Goals Unmet:  Not Applicable  Comments: Check out 1145   Dr. Sinda Du is Medical Director for [facility] Pulmonary Rehab.

## 2018-02-17 NOTE — Telephone Encounter (Signed)
Last Visit: 12/15/17 Next Visit: 03/18/18  Okay to refill 30 day supply per Dr. Estanislado Pandy. Patient to taper 1 mg a month.

## 2018-02-19 ENCOUNTER — Encounter (HOSPITAL_COMMUNITY)
Admission: RE | Admit: 2018-02-19 | Discharge: 2018-02-19 | Disposition: A | Discharge status: Home / Self Care | Payer: Medicare Other | Source: Ambulatory Visit | Attending: Internal Medicine | Admitting: Internal Medicine

## 2018-02-19 DIAGNOSIS — R0600 Dyspnea, unspecified: Secondary | ICD-10-CM

## 2018-02-19 DIAGNOSIS — R0609 Other forms of dyspnea: Principal | ICD-10-CM

## 2018-02-19 NOTE — Progress Notes (Signed)
Daily Session Note  Patient Details  Name: [name] MRN: [id number] Date of Birth: 03/08/1951 Referring Provider:     PULMONARY REHAB OTHER RESP ORIENTATION from 01/01/2018 in [place] [facility]  Referring Provider  Dr. Ramaswamy      Encounter Date: 02/19/2018  Check In: Session Check In - 02/19/18 1037      Check-In   Location  [facility]    Staff Present  Diane Coad, MS, EP, [facility], Exercise Physiologist;Truxton Lax, BS, EP, Exercise Physiologist;Debra Johnson, RN, BSN    Supervising physician immediately available to respond to emergencies  See telemetry face sheet for immediately available MD    Medication changes reported      No    Fall or balance concerns reported     No    Warm-up and Cool-down  Performed as group-led instruction    Resistance Training Performed  Yes    VAD Patient?  No      Pain Assessment   Currently in Pain?  No/denies    Pain Score  0-No pain    Multiple Pain Sites  No       Capillary Blood Glucose: No results found for this or any previous visit (from the past 24 hour(s)).    Social History   Tobacco Use  Smoking Status Former Smoker  . Packs/day: 3.00  . Years: 20.00  . Pack years: 60.00  . Types: Cigarettes  . Last attempt to quit: 12/30/1989  . Years since quitting: 28.1  Smokeless Tobacco Never Used    Goals Met:  Independence with exercise equipment Improved SOB with ADL's Using PLB without cueing & demonstrates good technique Exercise tolerated well No report of cardiac concerns or symptoms Strength training completed today  Goals Unmet:  Not Applicable  Comments: Check out 1145   Dr. Edward Hawkins is Medical Director for [facility]. 

## 2018-02-21 ENCOUNTER — Other Ambulatory Visit: Payer: Self-pay | Admitting: Internal Medicine

## 2018-02-23 ENCOUNTER — Ambulatory Visit (HOSPITAL_COMMUNITY)
Admission: RE | Admit: 2018-02-23 | Discharge: 2018-02-23 | Disposition: A | Discharge status: Home / Self Care | Payer: Medicare Other | Source: Ambulatory Visit | Attending: Rheumatology | Admitting: Rheumatology

## 2018-02-23 DIAGNOSIS — M0579 Rheumatoid arthritis with rheumatoid factor of multiple sites without organ or systems involvement: Secondary | ICD-10-CM

## 2018-02-23 LAB — COMPREHENSIVE METABOLIC PANEL
ALT: 25 U/L (ref 17–63)
AST: 55 U/L — ABNORMAL HIGH (ref 15–41)
Albumin: 3.8 g/dL (ref 3.5–5.0)
Alkaline Phosphatase: 59 U/L (ref 38–126)
Anion gap: 12 (ref 5–15)
BUN: 8 mg/dL (ref 6–20)
CO2: 29 mmol/L (ref 22–32)
Calcium: 8.7 mg/dL — ABNORMAL LOW (ref 8.9–10.3)
Chloride: 99 mmol/L — ABNORMAL LOW (ref 101–111)
Creatinine, Ser: 1.17 mg/dL (ref 0.61–1.24)
GFR calc Af Amer: >60 mL/min (ref >60)
GFR calc non Af Amer: >60 mL/min (ref >60)
Glucose, Bld: 134 mg/dL — ABNORMAL HIGH (ref 65–99)
Potassium: 3.3 mmol/L — ABNORMAL LOW (ref 3.5–5.1)
Sodium: 140 mmol/L (ref 135–145)
Total Bilirubin: 0.7 mg/dL (ref 0.3–1.2)
Total Protein: 6.4 g/dL — ABNORMAL LOW (ref 6.5–8.1)

## 2018-02-23 LAB — CBC
HCT: 44.3 % (ref 39.0–52.0)
Hemoglobin: 14.8 g/dL (ref 13.0–17.0)
MCH: 30 pg (ref 26.0–34.0)
MCHC: 33.4 g/dL (ref 30.0–36.0)
MCV: 89.9 fL (ref 78.0–100.0)
Platelets: 179 10*3/uL (ref 150–400)
RBC: 4.93 MIL/uL (ref 4.22–5.81)
RDW: 14.6 % (ref 11.5–15.5)
WBC: 7.6 10*3/uL (ref 4.0–10.5)

## 2018-02-23 MED ORDER — ACETAMINOPHEN 325 MG PO TABS: 650.0000 mg | ORAL_TABLET | ORAL | Status: DC

## 2018-02-23 MED ORDER — ACETAMINOPHEN 325 MG PO TABS
ORAL_TABLET | ORAL | Status: AC
  Filled 2018-02-23: qty 2

## 2018-02-23 MED ORDER — DIPHENHYDRAMINE HCL 25 MG PO CAPS
ORAL_CAPSULE | ORAL | Status: AC
  Filled 2018-02-23: qty 1

## 2018-02-23 MED ORDER — TOCILIZUMAB 400 MG/20ML IV SOLN
4.0000 mg/kg | INTRAVENOUS | Status: DC
  Administered 2018-02-23: 400 mg via INTRAVENOUS
  Filled 2018-02-23: qty 20

## 2018-02-23 MED ORDER — DIPHENHYDRAMINE HCL 25 MG PO CAPS: 25.0000 mg | ORAL_CAPSULE | ORAL | Status: DC

## 2018-02-23 NOTE — Progress Notes (Signed)
Potassium is low.  Please notify patient and fax results to his PCP.

## 2018-02-24 ENCOUNTER — Encounter (HOSPITAL_COMMUNITY)
Admission: RE | Admit: 2018-02-24 | Discharge: 2018-02-24 | Disposition: A | Discharge status: Home / Self Care | Payer: Medicare Other | Source: Ambulatory Visit | Attending: Internal Medicine | Admitting: Internal Medicine

## 2018-02-24 DIAGNOSIS — R0600 Dyspnea, unspecified: Secondary | ICD-10-CM

## 2018-02-24 DIAGNOSIS — R0609 Other forms of dyspnea: Principal | ICD-10-CM

## 2018-02-24 NOTE — Progress Notes (Signed)
Daily Session Note  Patient Details  Name: [name] MRN: [id number] Date of Birth: February 06, 1951 Referring Provider:     PULMONARY REHAB OTHER RESP ORIENTATION from 01/01/2018 in [place]  Referring Provider  Dr. Chase Caller      Encounter Date: 02/24/2018  Check In: Session Check In - 02/24/18 1037      Check-In   Location  [facility]    Staff Present  [name], MS, EP, [facility], Exercise Physiologist;Greg Cratty Luther Parody, BS, EP, Exercise Physiologist;Debra Wynetta Emery, RN, BSN    Supervising physician immediately available to respond to emergencies  See telemetry face sheet for immediately available MD    Medication changes reported      No    Fall or balance concerns reported     No    Tobacco Cessation  No Change    Warm-up and Cool-down  Performed as group-led instruction    Resistance Training Performed  Yes    VAD Patient?  No      Pain Assessment   Currently in Pain?  No/denies    Pain Score  0-No pain    Multiple Pain Sites  No       Capillary Blood Glucose: No results found for this or any previous visit (from the past 24 hour(s)).    Social History   Tobacco Use  Smoking Status Former Smoker  . Packs/day: 3.00  . Years: 20.00  . Pack years: 60.00  . Types: Cigarettes  . Last attempt to quit: 12/30/1989  . Years since quitting: 28.1  Smokeless Tobacco Never Used    Goals Met:  Independence with exercise equipment Improved SOB with ADL's Using PLB without cueing & demonstrates good technique Exercise tolerated well No report of cardiac concerns or symptoms Strength training completed today  Goals Unmet:  Not Applicable  Comments: Check out 1145   Dr. Sinda Du is Medical Director for [facility] Pulmonary Rehab.

## 2018-02-25 ENCOUNTER — Ambulatory Visit: Payer: Medicare Other | Admitting: Rheumatology

## 2018-02-25 DIAGNOSIS — M069 Rheumatoid arthritis, unspecified: Secondary | ICD-10-CM | POA: Diagnosis not present

## 2018-02-25 DIAGNOSIS — Z6833 Body mass index (BMI) 33.0-33.9, adult: Secondary | ICD-10-CM | POA: Diagnosis not present

## 2018-02-25 DIAGNOSIS — E6609 Other obesity due to excess calories: Secondary | ICD-10-CM | POA: Diagnosis not present

## 2018-02-25 DIAGNOSIS — Z7901 Long term (current) use of anticoagulants: Secondary | ICD-10-CM | POA: Diagnosis not present

## 2018-02-26 ENCOUNTER — Encounter (HOSPITAL_COMMUNITY)
Admission: RE | Admit: 2018-02-26 | Discharge: 2018-02-26 | Disposition: A | Discharge status: Home / Self Care | Payer: Medicare Other | Source: Ambulatory Visit | Attending: Internal Medicine | Admitting: Internal Medicine

## 2018-02-26 ENCOUNTER — Other Ambulatory Visit: Payer: Self-pay | Admitting: Rheumatology

## 2018-02-26 ENCOUNTER — Telehealth: Payer: Self-pay | Admitting: Rheumatology

## 2018-02-26 DIAGNOSIS — R0600 Dyspnea, unspecified: Secondary | ICD-10-CM | POA: Diagnosis not present

## 2018-02-26 DIAGNOSIS — R0609 Other forms of dyspnea: Principal | ICD-10-CM

## 2018-02-26 NOTE — Telephone Encounter (Signed)
Last visit: 12/15/2017 Next visit: 03/18/2018 Labs: 02/23/2018 potassium is low   Okay to refill per Dr. Estanislado Pandy.

## 2018-02-26 NOTE — Progress Notes (Signed)
Daily Session Note  Patient Details  Name: [name] MRN: [id number] Date of Birth: [birth year] Referring Provider:     PULMONARY REHAB OTHER RESP ORIENTATION from 01/01/2018 in [place]  Referring Provider  Dr. Chase Caller      Encounter Date: 02/26/2018  Check In: Session Check In - 02/26/18 1051      Check-In   Location  [facility]    Staff Present  [name], MS, EP, [facility], Exercise Physiologist;Taryll Reichenberger Luther Parody, BS, EP, Exercise Physiologist    Supervising physician immediately available to respond to emergencies  See telemetry face sheet for immediately available MD    Medication changes reported      No    Fall or balance concerns reported     No    Tobacco Cessation  No Change    Warm-up and Cool-down  Performed as group-led instruction    Resistance Training Performed  Yes    VAD Patient?  No      Pain Assessment   Currently in Pain?  No/denies    Pain Score  0-No pain    Multiple Pain Sites  No       Capillary Blood Glucose: No results found for this or any previous visit (from the past 24 hour(s)).    Social History   Tobacco Use  Smoking Status Former Smoker  . Packs/day: 3.00  . Years: 20.00  . Pack years: 60.00  . Types: Cigarettes  . Last attempt to quit: 12/30/1989  . Years since quitting: 28.1  Smokeless Tobacco Never Used    Goals Met:  Independence with exercise equipment Improved SOB with ADL's Using PLB without cueing & demonstrates good technique Exercise tolerated well No report of cardiac concerns or symptoms Strength training completed today  Goals Unmet:  Not Applicable  Comments: Check out 1145   Dr. Sinda Du is Medical Director for [facility] Pulmonary Rehab.

## 2018-02-26 NOTE — Telephone Encounter (Signed)
Patient was returning Andrea's call.  Possible in reference to lab results.Please call patient to advise.

## 2018-02-27 NOTE — Telephone Encounter (Signed)
Patient advised of lab results and copy sent to PCP. Patient states his hands are still hurting but still declines the pain management referral and the RX for Tramadol. Will call back if the pain increases.

## 2018-03-03 ENCOUNTER — Encounter (HOSPITAL_COMMUNITY)
Admission: RE | Admit: 2018-03-03 | Discharge: 2018-03-03 | Disposition: A | Discharge status: Home / Self Care | Payer: Medicare Other | Source: Ambulatory Visit | Attending: Internal Medicine | Admitting: Internal Medicine

## 2018-03-03 ENCOUNTER — Other Ambulatory Visit: Payer: Self-pay | Admitting: Rheumatology

## 2018-03-03 DIAGNOSIS — R0609 Other forms of dyspnea: Secondary | ICD-10-CM

## 2018-03-03 DIAGNOSIS — R0600 Dyspnea, unspecified: Secondary | ICD-10-CM

## 2018-03-03 NOTE — Progress Notes (Signed)
Daily Session Note  Patient Details  Name: [name] MRN: [id number] Date of Birth: 1951/05/03 Referring Provider:     PULMONARY REHAB OTHER RESP ORIENTATION from 01/01/2018 in [place]  Referring Provider  Dr. Chase Caller      Encounter Date: 03/03/2018  Check In: Session Check In - 03/03/18 1052      Check-In   Location  [facility]    Staff Present  [name], MS, EP, [facility], Exercise Physiologist;Brylee Mcgreal Luther Parody, BS, EP, Exercise Physiologist    Supervising physician immediately available to respond to emergencies  See telemetry face sheet for immediately available MD    Medication changes reported      No    Fall or balance concerns reported     No    Tobacco Cessation  No Change    Warm-up and Cool-down  Performed as group-led instruction    Resistance Training Performed  Yes    VAD Patient?  No      Pain Assessment   Currently in Pain?  No/denies    Pain Score  0-No pain    Multiple Pain Sites  No       Capillary Blood Glucose: No results found for this or any previous visit (from the past 24 hour(s)).  Exercise Prescription Changes - 03/03/18 0700      Response to Exercise   Blood Pressure (Admit)  128/68    Blood Pressure (Exercise)  168/72    Blood Pressure (Exit)  134/60    Heart Rate (Admit)  70 bpm    Heart Rate (Exercise)  96 bpm    Heart Rate (Exit)  95 bpm    Oxygen Saturation (Admit)  92 %    Oxygen Saturation (Exercise)  93 %    Oxygen Saturation (Exit)  94 %    Rating of Perceived Exertion (Exercise)  11    Perceived Dyspnea (Exercise)  11    Duration  Progress to 30 minutes of  aerobic without signs/symptoms of physical distress    Intensity  THRR New [id number]      Progression   Progression  Continue to progress workloads to maintain intensity without signs/symptoms of physical distress.      Resistance Training   Training Prescription  Yes    Weight  2    Reps  10-15      Treadmill   MPH  2.6     Grade  0    Minutes  15    METs  2.9      NuStep   Level  3    SPM  117    Minutes  20    METs  3.7      Home Exercise Plan   Plans to continue exercise at  Home (comment)    Frequency  Add 2 additional days to program exercise sessions.    Initial Home Exercises Provided  01/08/17       Social History   Tobacco Use  Smoking Status Former Smoker  . Packs/day: 3.00  . Years: 20.00  . Pack years: 60.00  . Types: Cigarettes  . Last attempt to quit: 12/30/1989  . Years since quitting: 28.1  Smokeless Tobacco Never Used    Goals Met:  Independence with exercise equipment Improved SOB with ADL's Using PLB without cueing & demonstrates good technique Exercise tolerated well No report of cardiac concerns or symptoms Strength training completed today  Goals Unmet:  Not Applicable  Comments: Check  out 1145   Dr. Sinda Du is Medical Director for [facility] Pulmonary Rehab.

## 2018-03-03 NOTE — Telephone Encounter (Signed)
Last Visit: 12/15/17  Next visit: 03/18/18 Labs: 02/23/18 Potassium is low  Okay to refill per Dr. Estanislado Pandy

## 2018-03-04 ENCOUNTER — Other Ambulatory Visit: Payer: Self-pay | Admitting: *Deleted

## 2018-03-04 DIAGNOSIS — C61 Malignant neoplasm of prostate: Secondary | ICD-10-CM | POA: Diagnosis not present

## 2018-03-04 NOTE — Progress Notes (Signed)
Office Visit Note  Patient: [name]             Date of Birth: Feb 20, 1951           MRN: [id number]             PCP: [name], MD Referring: [name], MD Visit Date: 03/18/2018 Occupation: @GUAROCC @    Subjective:  Thoracic pain, hand stiffness.   History of Present Illness: Mark THERIEN is a 67 y.o. male with history of rheumatoid arthritis, osteoarthritis and osteoporosis.  He states he has been having increased hand pain for the last few weeks.  This week his hands are better and they are not hurting.  He does have some stiffness in his hands.  He also describes some thoracic pain which is in the lower thoracic region.  He denies any gout flare.  He has been off prednisone for 3 weeks now.  He has been taking Arava and Actemra IV on a regular basis.  Activities of Daily Living:  Patient reports morning stiffness for 0 minute.   Patient Reports nocturnal pain.  Difficulty dressing/grooming: Denies Difficulty climbing stairs: Denies Difficulty getting out of chair: Denies Difficulty using hands for taps, buttons, cutlery, and/or writing: Reports   Review of Systems  Constitutional: Positive for fatigue. Negative for night sweats and weakness ( ).  HENT: Negative for mouth sores, mouth dryness and nose dryness.   Eyes: Negative for redness and dryness.  Respiratory: Positive for shortness of breath. Negative for difficulty breathing.        History of COPD  Cardiovascular: Positive for hypertension. Negative for chest pain, palpitations, irregular heartbeat and swelling in legs/feet.  Gastrointestinal: Negative for constipation and diarrhea.  Endocrine: Negative for increased urination.  Musculoskeletal: Positive for arthralgias, joint pain and morning stiffness. Negative for joint swelling, myalgias, muscle weakness, muscle tenderness and myalgias.  Skin: Negative for color change, rash, hair loss, nodules/bumps, skin tightness, ulcers and sensitivity to  sunlight.  Allergic/Immunologic: Negative for susceptible to infections.  Neurological: Negative for dizziness, fainting, memory loss and night sweats.  Hematological: Negative for swollen glands.  Psychiatric/Behavioral: Positive for depressed mood and sleep disturbance. The patient is nervous/anxious.     PMFS History:  Patient Active Problem List   Diagnosis Date Noted  . Dyspnea on exertion 10/22/2017  . History of seizure disorder 02/27/2017  . Primary osteoarthritis of both hands 02/27/2017  . Primary osteoarthritis of both feet 02/27/2017  . Idiopathic gout of multiple sites 02/27/2017  . High risk medication use 12/29/2016  . History of prostate cancer 12/29/2016  . Primary osteoarthritis of both knees 12/29/2016  . Chronic idiopathic gout involving toe without tophus 12/29/2016  . History of CHF (congestive heart failure) 12/29/2016  . History of COPD 12/29/2016  . Plantar pustular psoriasis 12/29/2016  . DVT (deep venous thrombosis) ([place]) 10/31/2016  . COPD (chronic obstructive pulmonary disease) ([place]) 10/31/2016  . CHF (congestive heart failure) ([place]) 10/31/2016  . Prostate cancer ([place]) 08/30/2015  . Malignant neoplasm of prostate ([place]) 07/04/2015  . Chest pain at rest 07/05/2014  . Weakness 07/05/2014  . Complicated postphlebitic syndrome 11/16/2013  . Personal history of DVT (deep vein thrombosis) 05/18/2013  . Chronic anticoagulation 05/18/2013  . Diverticulosis of colon without hemorrhage 05/18/2013  . Rheumatoid arthritis ([place]) 05/18/2013  . Seizure disorder ([place]) 05/18/2013  . Hx of adenomatous colonic polyps 05/18/2013  . GERD 05/08/2010  . NAUSEA 05/08/2010  . FLATULENCE-GAS-BLOATING 05/08/2010    Past Medical  History:  Diagnosis Date  . Anxiety    hx of   . Arthritis   . Asthma   . Clotting disorder (HCC)    DVT both legs   . COPD (chronic obstructive pulmonary disease) ([place])   . DDD (degenerative disc disease), cervical    with UE's paresthesias    . Diverticulosis   . DVT, lower extremity ([place])    bilat  . 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 GERD (gastroesophageal reflux disease)   . Gout   . Heart murmur   . History of measles   . History of shingles   . Hypercholesterolemia   . IBS (irritable bowel syndrome)   . IBS (irritable bowel syndrome)   . Peripheral edema   . Pneumonia    hx of   . Prostate cancer ([place])   . Seizures ([place])    last seizure 20 years ago   . Shortness of breath dyspnea    exertion   . Sleep apnea    not on cpap  . Tubular adenoma of colon 04/2008    Family History  Problem Relation Age of Onset  . Skin cancer Father   . Stomach cancer Father   . Heart attack Father   . Alzheimer's disease Mother   . Throat cancer Brother   . Stomach cancer Brother   . Lung cancer Brother   . Heart attack Brother   . Heart attack Brother   . Breast cancer Sister   . Heart attack Sister   . Stroke Sister   . Bladder Cancer Sister   . Heart murmur Sister   . Colon cancer Neg Hx   . Colon polyps Neg Hx    Past Surgical History:  Procedure Laterality Date  . CHOLECYSTECTOMY    . COLONOSCOPY    . DOPPLER ECHOCARDIOGRAPHY N/A 01-21-2012   TECHNICALLY DIFFICULT. MILD CONCENTRIC LV HYPERTROPHY. LV CAVITY IS SMALL.. EF=> 55%. TRANSMITRAL SPECTRAL FLOW PATTREN IS SUGGESTIVE OF IMPAIRED LV RELAXATION. RV SYSTOLIC PRESSURE IS 63OVFI. LEFT ATRIAL SIZE IS NORMAL. AV APPEARS MILDLY SCLEROTIC. NO SIGN VALVE DISEASE NOTED.  [name] LYMPHADENECTOMY Bilateral 08/30/2015   Procedure: PELVIC LYMPHADENECTOMY;  Surgeon: [name], MD;  Location: [facility];  Service: Urology;  Laterality: Bilateral;  . NUCLEAR STRESS TEST N/A 01-21-2012   NORMAL PATTERN OF PERFUSION IN ALL REGIONS. POST STRESS LV SIZE IS NORMAL. NO EVIDENCE OF INDUCIBLE ISCHEMIA. EF 54%.  [name] POLYPECTOMY    . PROSTATE BIOPSY    . ROBOT ASSISTED LAPAROSCOPIC RADICAL PROSTATECTOMY N/A 08/30/2015    Procedure: ROBOTIC ASSISTED LAPAROSCOPIC RADICAL PROSTATECTOMY;  Surgeon: [name], MD;  Location: [facility];  Service: Urology;  Laterality: N/A;  . US VENOUS LOWER EXT Right 03/07/11   PERSISTENT DVT IN RIGHT LOWER EXT.WITH PERSISTANT VISUALIZATION OF HYPOECHOIC THROMBUS WITHIN THE FEMORAL, PROFUNDA FEMORAL AND POPLITEAL VEINS. WHEN COMPARED TO PREVIOUS, CLOT IS NO LONGER IDENTIFIED WITH IN THE RIGHT CFV.   Social History   Social History Narrative  . Not on file     Objective: Vital Signs: BP (!) 141/85 (BP Location: Left Arm, Patient Position: Sitting, Cuff Size: Normal)   Pulse 73   Ht 5\' 9"  (1.753 m)   Wt 217 lb (98.4 kg)   BMI 32.05 kg/m    Physical Exam  Constitutional: He is oriented to person, place, and time. He appears well-developed and well-nourished.  HENT:  Head: Normocephalic  and atraumatic.  Eyes: Conjunctivae and EOM are normal. Pupils are equal, round, and reactive to light.  Neck: Normal range of motion. Neck supple.  Cardiovascular: Normal rate, regular rhythm and normal heart sounds.  Pulmonary/Chest: Effort normal and breath sounds normal.  Abdominal: Soft. Bowel sounds are normal.  Neurological: He is alert and oriented to person, place, and time.  Skin: Skin is warm and dry. Capillary refill takes less than 2 seconds.  Psychiatric: He has a normal mood and affect. His behavior is normal.  Nursing note and vitals reviewed.    Musculoskeletal Exam: He has limited range of motion of his C-spine.  He has discomfort in the lower thoracic region.  Lumbar spine with good range of motion.  Shoulder joints, elbow joints, wrist joints were good range of motion.  He has some synovitis over his right fifth MCP joint.  Rest of the MCPs had synovial thickening but no synovitis.  He also has DIP PIP thickening.  Hip joints and knee joints were in good range of motion.  No synovitis was noted.  CDAI Exam: CDAI Homunculus Exam:   Tenderness:  Right hand: 5th  MCP  Swelling:  Right hand: 5th MCP  Joint Counts:  CDAI Tender Joint count: 1 CDAI Swollen Joint count: 1  Global Assessments:  Patient Global Assessment: 2 Provider Global Assessment: 2  CDAI Calculated Score: 6    Investigation: No additional findings.TB Gold: 01/26/2018 Negative  CBC Latest Ref Rng & Units 02/23/2018 12/01/2017 09/25/2017  WBC 4.0 - 10.5 K/uL 7.6 10.3 6.4  Hemoglobin 13.0 - 17.0 g/dL 14.8 14.3 14.5  Hematocrit 39.0 - 52.0 % 44.3 43.7 43.1  Platelets 150 - 400 K/uL 179 259 186   CMP Latest Ref Rng & Units 02/23/2018 12/01/2017 09/25/2017  Glucose 65 - 99 mg/dL 134(H) 113(H) 120(H)  BUN 6 - 20 mg/dL 8 16 16   Creatinine 0.61 - 1.24 mg/dL 1.17 1.04 1.32(H)  Sodium 135 - 145 mmol/L 140 142 140  Potassium 3.5 - 5.1 mmol/L 3.3(L) 3.4(L) 3.3(L)  Chloride 101 - 111 mmol/L 99(L) 101 98(L)  CO2 22 - 32 mmol/L 29 31 32  Calcium 8.9 - 10.3 mg/dL 8.7(L) 8.7(L) 9.1  Total Protein 6.5 - 8.1 g/dL 6.4(L) 6.2(L) -  Total Bilirubin 0.3 - 1.2 mg/dL 0.7 0.6 -  Alkaline Phos 38 - 126 U/L 59 94 -  AST 15 - 41 U/L 55(H) 32 -  ALT 17 - 63 U/L 25 26 -    Imaging: Xr Cervical Spine 2 Or 3 Views  Result Date: 03/18/2018 Multilevel spondylosis was noted.  C5-6 and 6 7 disc space narrowing with anterior osteophytes was noted.  Mild facet joint arthropathy was noted.  Xr Hand 2 View Left  Result Date: 03/18/2018 PIP and DIP narrowing was noted.  No significant MCP joint narrowing was noted except for the first MCP joint.  No erosive changes were noted.  No radiocarpal or intercarpal joint space narrowing was noted.  Juxta-articular osteopenia was noted. Impression: These findings were consistent with osteoarthritis and rheumatoid arthritis overlap.  Xr Hand 2 View Right  Result Date: 03/18/2018 PIP and DIP narrowing was noted.  No significant MCP joint narrowing was noted except for the first MCP joint.  No erosive changes were noted.  No radiocarpal or intercarpal joint space  narrowing was noted.  Juxta-articular osteopenia was noted. Impression: These findings were consistent with osteoarthritis and rheumatoid arthritis overlap.   Speciality Comments: ACTEMRA 4mg /kg x 4 weeks PPD negative 01/02/17  Procedures:  No procedures performed Allergies: Orencia [abatacept]; Carbamazepine; Celecoxib; Cephalexin; Enbrel [etanercept]; Humira [adalimumab]; Levofloxacin; Sulfa antibiotics; and Sulfasalazine   Assessment / Plan:     Visit Diagnoses: Rheumatoid arthritis involving multiple sites with positive rheumatoid factor ([place]): Patient is clinically doing much better on combination of leflunomide and Actemra IV.  He had mild synovitis in right fifth MCP joint only.  Is been also off prednisone for the last 3 weeks.  He gives history of intermittent swelling and flares.  High risk medication use - Arava 20 mg po qd and Actemra IV (05/2017) Allergy to SULFASALAZINE, METHOTREXATE, and ORENCIA. Inadequate response to Enbrel and Humira.  He had low potassium in February.  I will check BMP, uric acid and vitamin D level today.  Neck pain - Plan: XR Cervical Spine 2 or 3 views  Chronic midline thoracic back pain: He had MRI of his thoracic spine which was unremarkable.  Pain in both hands - Plan: XR Hand 2 View Right, XR Hand 2 View Left  Primary osteoarthritis of both hands -he continues to have a lot of discomfort due to osteoarthritis and disc disease.  His wife and him are interested in a referral to pain clinic.  We will make that referral for him.  Plan: Ambulatory referral to Pain Clinic  Primary osteoarthritis of both knees - Plan: Ambulatory referral to Pain Clinic  Primary osteoarthritis of both feet - Plan: Ambulatory referral to Pain Clinic  Idiopathic chronic gout of multiple sites without tophus - Allopurinol 200 mg by mouth daily.  He denies any gout flare.  I will check his uric acid level today.  Age-related osteoporosis without current pathological  fracture - 09/24/2017 DXA T score -2.60, BMD 0.571 right femoral neck. He is on Fosamax 70 mg by mouth every week.  He is also on calcium and vitamin D  Other medical problems are listed as follows:  History of adenomatous polyp of colon  History of CHF (congestive heart failure)  History of seizure disorder  History of COPD  History of prostate cancer  History of DVT (deep vein thrombosis)  History of obesity    Orders: Orders Placed This Encounter  Procedures  . XR Hand 2 View Right  . XR Hand 2 View Left  . XR Cervical Spine 2 or 3 views  . BASIC METABOLIC PANEL WITH GFR  . Uric acid  . VITAMIN D 25 Hydroxy (Vit-D Deficiency, Fractures)  . Ambulatory referral to Pain Clinic   Meds ordered this encounter  Medications  . alendronate (FOSAMAX) 70 MG tablet    Sig: TAKE 1 TABLET BY MOUTH ONCE A WEEK. TAKE WITH FULL GLASS OF WATER ON EMPTY STOMACH    Dispense:  12 tablet    Refill:  0    Face-to-face time spent with patient was 30 minutes.  Greater than 50% of time was spent in counseling and coordination of care.  Follow-Up Instructions: Return in about 3 months (around 06/18/2018) for Rheumatoid arthritis, Osteoarthritis, Osteoporosis.   Bo Merino, MD  Note - This record has been created using Editor, commissioning.  Chart creation errors have been sought, but may not always  have been located. Such creation errors do not reflect on  the standard of medical care.

## 2018-03-04 NOTE — Progress Notes (Signed)
Infusion orders are current for patient CBC CMP Tylenol Benadryl appointments are up to date and follow up appointment  is scheduled TB gold not due yet.  

## 2018-03-05 ENCOUNTER — Encounter (HOSPITAL_COMMUNITY)
Admission: RE | Admit: 2018-03-05 | Discharge: 2018-03-05 | Disposition: A | Discharge status: Home / Self Care | Payer: Medicare Other | Source: Ambulatory Visit | Attending: Internal Medicine | Admitting: Internal Medicine

## 2018-03-05 DIAGNOSIS — R0609 Other forms of dyspnea: Principal | ICD-10-CM

## 2018-03-05 DIAGNOSIS — R0600 Dyspnea, unspecified: Secondary | ICD-10-CM

## 2018-03-05 NOTE — Progress Notes (Signed)
Daily Session Note  Patient Details  Name: [name] MRN: [id number] Date of Birth: 12/27/1951 Referring Provider:     PULMONARY REHAB OTHER RESP ORIENTATION from 01/01/2018 in [place]  Referring Provider  Dr. Chase Caller      Encounter Date: 03/05/2018  Check In: Session Check In - 03/05/18 1040      Check-In   Location  [facility]    Staff Present  [name], MS, EP, [facility], Exercise Physiologist;Rolinda Impson Luther Parody, BS, EP, Exercise Physiologist;Debra Wynetta Emery, RN, BSN    Supervising physician immediately available to respond to emergencies  See telemetry face sheet for immediately available MD    Medication changes reported      No    Fall or balance concerns reported     No    Tobacco Cessation  No Change    Warm-up and Cool-down  Performed as group-led instruction    Resistance Training Performed  Yes    VAD Patient?  No      Pain Assessment   Currently in Pain?  No/denies    Pain Score  0-No pain    Multiple Pain Sites  No       Capillary Blood Glucose: No results found for this or any previous visit (from the past 24 hour(s)).    Social History   Tobacco Use  Smoking Status Former Smoker  . Packs/day: 3.00  . Years: 20.00  . Pack years: 60.00  . Types: Cigarettes  . Last attempt to quit: 12/30/1989  . Years since quitting: 28.1  Smokeless Tobacco Never Used    Goals Met:  Independence with exercise equipment Improved SOB with ADL's Using PLB without cueing & demonstrates good technique Exercise tolerated well No report of cardiac concerns or symptoms Strength training completed today  Goals Unmet:  Not Applicable  Comments: Check out 1145   Dr. Sinda Du is Medical Director for [facility].

## 2018-03-05 NOTE — Progress Notes (Signed)
Pulmonary Individual Treatment Plan  Patient Details  Name: [name] MRN: [id number] Date of Birth: December 11, 1951 Referring Provider:     PULMONARY REHAB OTHER RESP ORIENTATION from 01/01/2018 in [place]  Referring Provider  Dr. Chase Caller      Initial Encounter Date:    [facility] from 01/01/2018 in [place]  Date  01/01/18  Referring Provider  Dr. Chase Caller      Visit Diagnosis: Dyspnea on exertion  Patient's Home Medications on Admission:   Current Outpatient Medications:  .  acetaminophen (TYLENOL) 500 MG tablet, Take 1,000 mg by mouth every 6 (six) hours as needed (arthritis pain)., Disp: , Rfl:  .  albuterol (PROVENTIL) (2.5 MG/3ML) 0.083% nebulizer solution, Take 3 mLs (2.5 mg total) by nebulization every 6 (six) hours as needed for wheezing or shortness of breath., Disp: 75 mL, Rfl: 12 .  albuterol (PROVENTIL) 2 MG tablet, Take 2 mg by mouth 2 (two) times daily., Disp: , Rfl:  .  alendronate (FOSAMAX) 70 MG tablet, Take 1 tablet (70 mg total) by mouth once a week. Take with a full glass of water on an empty stomach., Disp: 12 tablet, Rfl: 1 .  allopurinol (ZYLOPRIM) 100 MG tablet, TAKE 1 TABLET BY MOUTH TWICE A DAY, Disp: 180 tablet, Rfl: 0 .  Calcium-Phosphorus-Vitamin D (CITRACAL +D3 PO), Take 2 tablets by mouth 2 (two) times daily. , Disp: , Rfl:  .  dicyclomine (BENTYL) 10 MG capsule, TAKE 1 CAPSULE BY MOUTH 4 TIMES DAILY BEFORE MEALS AND AT BEDTIME (Patient taking differently: TAKE 2 CAPSULE BY MOUTH TWO TIMES DAILY), Disp: 120 capsule, Rfl: 0 .  doxycycline (VIBRA-TABS) 100 MG tablet, Take 100 mg by mouth 2 (two) times daily. For ten days.  Started 12/31/2017., Disp: , Rfl:  .  fluticasone furoate-vilanterol (BREO ELLIPTA) 100-25 MCG/INH AEPB, Inhale 1 puff into the lungs daily., Disp: 1 each, Rfl: 4 .  folic acid (FOLVITE) 1 MG tablet, TAKE 2 TABLETS BY MOUTH EVERY MORNING, Disp: 180 tablet,  Rfl: 4 .  furosemide (LASIX) 40 MG tablet, TAKE 1 TABLET BY MOUTH TWO TIMES DAILY (Patient taking differently: TAKE 2 TABLETS BY MOUTH (Evenings)), Disp: 60 tablet, Rfl: 8 .  INCRUSE ELLIPTA 62.5 MCG/INH AEPB, INHALE 1 PUFF INTO LUNGS DAILY, Disp: 30 each, Rfl: 3 .  leflunomide (ARAVA) 20 MG tablet, TAKE 1 TABLET BY MOUTH EVERY DAY, Disp: 30 tablet, Rfl: 2 .  omeprazole (PRILOSEC) 40 MG capsule, Take 40 mg by mouth 2 (two) times daily. , Disp: , Rfl: 2 .  Oxymetazoline HCl (NASAL SPRAY NA), Place 2 sprays into the nose daily as needed (allergies)., Disp: , Rfl:  .  phenytoin (DILANTIN) 100 MG ER capsule, Take 100-200 mg by mouth 2 (two) times daily. Take one capsule in the morning and 2 capsules at bedtime, Disp: , Rfl:  .  predniSONE (DELTASONE) 1 MG tablet, Take 4 tablets (4 mg total) by mouth daily with breakfast. Take with 5 mg tablet / 9 mg in Nov, 8mg  in Dec, 7mg  in [place] by 1 mg per month, Disp: 270 tablet, Rfl: 1 .  predniSONE (DELTASONE) 5 MG tablet, TAKE 1 TABLET BY MOUTH EVERY DAY WITH BREAKFAST, Disp: 30 tablet, Rfl: 0 .  PROAIR HFA 108 (90 Base) MCG/ACT inhaler, Inhale 1-2 puffs into the lungs every 6 (six) hours as needed. , Disp: , Rfl:  .  Tocilizumab (ACTEMRA IV), Inject into the vein every 28 (twenty-eight) days. For Rheumatoid  Arthritis, Disp: , Rfl:  .  warfarin (COUMADIN) 3 MG tablet, Take 3 mg by mouth at bedtime. , Disp: , Rfl:   Past Medical History: Past Medical History:  Diagnosis Date  . Anxiety    hx of   . Arthritis   . Asthma   . Clotting disorder (HCC)    DVT both legs   . COPD (chronic obstructive pulmonary disease) ([place])   . DDD (degenerative disc disease), cervical    with UE's paresthesias  . Diverticulosis   . DVT, lower extremity ([place])    bilat  . 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 GERD (gastroesophageal reflux disease)   . Gout   . Heart murmur   . History of measles   . History  of shingles   . Hypercholesterolemia   . IBS (irritable bowel syndrome)   . IBS (irritable bowel syndrome)   . Peripheral edema   . Pneumonia    hx of   . Prostate cancer ([place])   . Seizures ([place])    last seizure 20 years ago   . Shortness of breath dyspnea    exertion   . Sleep apnea    not on cpap  . Tubular adenoma of colon 04/2008    Tobacco Use: Social History   Tobacco Use  Smoking Status Former Smoker  . Packs/day: 3.00  . Years: 20.00  . Pack years: 60.00  . Types: Cigarettes  . Last attempt to quit: 12/30/1989  . Years since quitting: 28.1  Smokeless Tobacco Never Used    Labs: Recent Review Scientist, physiological    Labs for ITP Cardiac and Pulmonary Rehab Latest Ref Rng & Units 07/01/2010   TCO2 0 - 100 mmol/L 27      Capillary Blood Glucose: Lab Results  Component Value Date   GLUCAP 101 (H) 11/28/2008     Pulmonary Assessment Scores: Pulmonary Assessment Scores    Row Name 01/01/18 1017         ADL UCSD   ADL Phase  Entry     SOB Score total  18     Rest  0     Walk  2     Stairs  2     Bath  1     Dress  1     Shop  1       CAT Score   CAT Score  10       mMRC Score   mMRC Score  2        Pulmonary Function Assessment: Pulmonary Function Assessment - 01/01/18 1013      Pulmonary Function Tests   FVC%  49 %    FEV1%  54 %    FEV1/FVC Ratio  110    DLCO%  63 %      Initial Spirometry Results   FVC%  49 %    FEV1%  54 %    FEV1/FVC Ratio  110      Post Bronchodilator Spirometry Results   FVC%  48 %    FEV1%  54 %    FEV1/FVC Ratio  113      Breath   Bilateral Breath Sounds  Clear    Shortness of Breath  No       Exercise Target Goals:    Exercise Program Goal: Individual exercise prescription set using results from initial 6 min walk test and  THRR while considering  patient's activity barriers and safety.   Exercise Prescription Goal: Initial exercise prescription builds to 30-45 minutes a day of aerobic activity, 2-3  days per week.  Home exercise guidelines will be given to patient during program as part of exercise prescription that the participant will acknowledge.  Activity Barriers & Risk Stratification: Activity Barriers & Cardiac Risk Stratification - 01/01/18 0936      Activity Barriers & Cardiac Risk Stratification   Activity Barriers  Back Problems;Deconditioning    Cardiac Risk Stratification  Low       6 Minute Walk: 6 Minute Walk    Row Name 01/01/18 0932         6 Minute Walk   Phase  Initial     Distance  1200 feet     Distance % Change  0 %     Distance Feet Change  0 ft     Walk Time  6 minutes     # of Rest Breaks  0     MPH  2.27     METS  2.74     RPE  9     Perceived Dyspnea   9     VO2 Peak  9.54     Symptoms  No     Resting HR  84 bpm     Resting BP  136/67     Resting Oxygen Saturation   94 %     Exercise Oxygen Saturation  during 6 min walk  91 %     Max Ex. HR  105 bpm     Max Ex. BP  146/74     2 Minute Post BP  136/66        Oxygen Initial Assessment: Oxygen Initial Assessment - 01/01/18 1023      Home Oxygen   Home Oxygen Device  None    Sleep Oxygen Prescription  None    Home Exercise Oxygen Prescription  None    Home at Rest Exercise Oxygen Prescription  None      Initial 6 min Walk   Oxygen Used  None      Program Oxygen Prescription   Program Oxygen Prescription  None      Intervention   Short Term Goals  To learn and understand importance of monitoring SPO2 with pulse oximeter and demonstrate accurate use of the pulse oximeter.;To learn and understand importance of maintaining oxygen saturations>88%;To learn and demonstrate proper pursed lip breathing techniques or other breathing techniques.    Long  Term Goals  Verbalizes importance of monitoring SPO2 with pulse oximeter and return demonstration;Maintenance of O2 saturations>88%;Exhibits proper breathing techniques, such as pursed lip breathing or other method taught during program  session       Oxygen Re-Evaluation: Oxygen Re-Evaluation    Row Name 02/11/18 1342 03/05/18 1252           Program Oxygen Prescription   Program Oxygen Prescription  None  None        Home Oxygen   Home Oxygen Device  None  None      Sleep Oxygen Prescription  None  None      Home Exercise Oxygen Prescription  None  None      Home at Rest Exercise Oxygen Prescription  None  None      Compliance with Home Oxygen Use  -  Yes        Goals/Expected Outcomes   Short Term Goals  To learn and  understand importance of monitoring SPO2 with pulse oximeter and demonstrate accurate use of the pulse oximeter.;To learn and understand importance of maintaining oxygen saturations>88%;To learn and demonstrate proper pursed lip breathing techniques or other breathing techniques.  To learn and understand importance of monitoring SPO2 with pulse oximeter and demonstrate accurate use of the pulse oximeter.;To learn and understand importance of maintaining oxygen saturations>88%;To learn and demonstrate proper pursed lip breathing techniques or other breathing techniques.      Long  Term Goals  Verbalizes importance of monitoring SPO2 with pulse oximeter and return demonstration;Maintenance of O2 saturations>88%;Exhibits proper breathing techniques, such as pursed lip breathing or other method taught during program session  Verbalizes importance of monitoring SPO2 with pulse oximeter and return demonstration;Maintenance of O2 saturations>88%;Exhibits proper breathing techniques, such as pursed lip breathing or other method taught during program session      Comments  Patient is meeting both short and long term goals.   Patient is meeting both short and long term goals.       Goals/Expected Outcomes  Patient will continue to meet both his short and long term goals.   Patient will continue to meet both his short and long term goals.          Oxygen Discharge (Final Oxygen Re-Evaluation): Oxygen Re-Evaluation  - 03/05/18 1252      Program Oxygen Prescription   Program Oxygen Prescription  None      Home Oxygen   Home Oxygen Device  None    Sleep Oxygen Prescription  None    Home Exercise Oxygen Prescription  None    Home at Rest Exercise Oxygen Prescription  None    Compliance with Home Oxygen Use  Yes      Goals/Expected Outcomes   Short Term Goals  To learn and understand importance of monitoring SPO2 with pulse oximeter and demonstrate accurate use of the pulse oximeter.;To learn and understand importance of maintaining oxygen saturations>88%;To learn and demonstrate proper pursed lip breathing techniques or other breathing techniques.    Long  Term Goals  Verbalizes importance of monitoring SPO2 with pulse oximeter and return demonstration;Maintenance of O2 saturations>88%;Exhibits proper breathing techniques, such as pursed lip breathing or other method taught during program session    Comments  Patient is meeting both short and long term goals.     Goals/Expected Outcomes  Patient will continue to meet both his short and long term goals.        Initial Exercise Prescription: Initial Exercise Prescription - 01/01/18 0900      Date of Initial Exercise RX and Referring Provider   Date  01/01/18    Referring Provider  Dr. Chase Caller      Treadmill   MPH  2    Grade  0    Minutes  15    METs  2.5      NuStep   Level  2    SPM  97    Minutes  20    METs  1.9      Prescription Details   Frequency (times per week)  2    Duration  Progress to 30 minutes of continuous aerobic without signs/symptoms of physical distress      Intensity   THRR 40-80% of Max Heartrate  [phone]    Ratings of Perceived Exertion  11-13    Perceived Dyspnea  0-4      Progression   Progression  Continue progressive overload as per policy without signs/symptoms or physical distress.  Resistance Training   Training Prescription  Yes    Weight  1    Reps  10-15       Perform Capillary  Blood Glucose checks as needed.  Exercise Prescription Changes:  Exercise Prescription Changes    Row Name 01/08/18 1200 01/09/18 1400 01/27/18 1500 02/10/18 0700 03/03/18 0700     Response to Exercise   Blood Pressure (Admit)  -  144/76  132/64  130/68  128/68   Blood Pressure (Exercise)  -  140/80  150/82  144/78  168/72   Blood Pressure (Exit)  -  138/70  132/76  130/70  134/60   Heart Rate (Admit)  -  80 bpm  77 bpm  68 bpm  70 bpm   Heart Rate (Exercise)  -  98 bpm  102 bpm  93 bpm  96 bpm   Heart Rate (Exit)  -  105 bpm  94 bpm  85 bpm  95 bpm   Oxygen Saturation (Admit)  -  93 %  93 %  93 %  92 %   Oxygen Saturation (Exercise)  -  92 %  91 %  92 %  93 %   Oxygen Saturation (Exit)  -  92 %  92 %  94 %  94 %   Rating of Perceived Exertion (Exercise)  -  11  11  11  11    Perceived Dyspnea (Exercise)  -  11  11  11  11    Duration  -  Progress to 30 minutes of  aerobic without signs/symptoms of physical distress  Progress to 30 minutes of  aerobic without signs/symptoms of physical distress  Progress to 30 minutes of  aerobic without signs/symptoms of physical distress  Progress to 30 minutes of  aerobic without signs/symptoms of physical distress   Intensity  -  THRR New [id number]  THRR New 107-123-138  THRR New 102-119-136  THRR New 103-102-136     Progression   Progression  -  Continue to progress workloads to maintain intensity without signs/symptoms of physical distress.  Continue to progress workloads to maintain intensity without signs/symptoms of physical distress.  Continue to progress workloads to maintain intensity without signs/symptoms of physical distress.  Continue to progress workloads to maintain intensity without signs/symptoms of physical distress.     Resistance Training   Training Prescription  Yes  Yes  Yes  Yes  Yes   Weight  1  1  1  2  2    Reps  10-15  10-15  10-15  10-15  10-15     Treadmill   MPH  2  2  2.4  2.4  2.6   Grade  0  0  0  0  0   Minutes  15   15  15  15  15    METs  2.5  2.5  2.8  2.8  2.9     NuStep   Level  2  2  2  3  3    SPM  97  111  112  116  117   Minutes  20  20  20  20  20    METs  1.9  2.7  3.1  3.3  3.7     Home Exercise Plan   Plans to continue exercise at  Home (comment)  Home (comment)  Home (comment)  Home (comment)  Home (comment)   Frequency  Add 2 additional days to program exercise sessions.  Add 2  additional days to program exercise sessions.  Add 2 additional days to program exercise sessions.  Add 2 additional days to program exercise sessions.  Add 2 additional days to program exercise sessions.   Initial Home Exercises Provided  01/08/17  01/08/17  01/08/17  01/08/17  01/08/17      Exercise Comments:  Exercise Comments    Row Name 01/08/18 1257 01/09/18 1431 01/27/18 1519 02/10/18 0747 03/03/18 0759   Exercise Comments  Patient received the take home exercise plan today. THR was addressed as were safety guidelines for being active around the house. Patient addressed an understanding and was encouraged to ask any future questions as they arise.   Patient is doing well in PR and has increased his SPMs on the Nustep. He has jsut started the program but will be progressed more in time. We will help him reach his goals of breathing better and getting back into better shape.   Patient is doing well in CR. He has progressed on the treadmill with a higher speed. He has also maintained his high SPMs on the Nustep with his level   Patient continues to do well in PR. He has increased his level on the Nustep to level three and has maintained his speed on the treadmill as well. Patient states that he feels stronger throughout the program and has stated he can do more when not in the program.   Patient is doing well in PR and has increased his speed on the treadmill to 2.6. Patient has maintained his level on the nustep machine and has kept his watts and SPMs the same levels. Patient states that he feels stronger from the  program and more stamina as well so that he is able to do more yard work around the house.       Exercise Goals and Review:  Exercise Goals    Row Name 01/01/18 0939             Exercise Goals   Increase Physical Activity  Yes       Intervention  Provide advice, education, support and counseling about physical activity/exercise needs.;Develop an individualized exercise prescription for aerobic and resistive training based on initial evaluation findings, risk stratification, comorbidities and participant's personal goals.       Expected Outcomes  Achievement of increased cardiorespiratory fitness and enhanced flexibility, muscular endurance and strength shown through measurements of functional capacity and personal statement of participant.       Increase Strength and Stamina  Yes       Intervention  Provide advice, education, support and counseling about physical activity/exercise needs.;Develop an individualized exercise prescription for aerobic and resistive training based on initial evaluation findings, risk stratification, comorbidities and participant's personal goals.       Expected Outcomes  Achievement of increased cardiorespiratory fitness and enhanced flexibility, muscular endurance and strength shown through measurements of functional capacity and personal statement of participant.       Able to understand and use rate of perceived exertion (RPE) scale  Yes       Intervention  Provide education and explanation on how to use RPE scale       Expected Outcomes  Short Term: Able to use RPE daily in rehab to express subjective intensity level;Long Term:  Able to use RPE to guide intensity level when exercising independently       Able to understand and use Dyspnea scale  Yes       Intervention  Provide education  and explanation on how to use Dyspnea scale       Expected Outcomes  Short Term: Able to use Dyspnea scale daily in rehab to express subjective sense of shortness of breath  during exertion;Long Term: Able to use Dyspnea scale to guide intensity level when exercising independently       Knowledge and understanding of Target Heart Rate Range (THRR)  Yes       Intervention  Provide education and explanation of THRR including how the numbers were predicted and where they are located for reference       Expected Outcomes  Short Term: Able to state/look up THRR;Long Term: Able to use THRR to govern intensity when exercising independently;Short Term: Able to use daily as guideline for intensity in rehab       Able to check pulse independently  Yes       Intervention  Provide education and demonstration on how to check pulse in carotid and radial arteries.;Review the importance of being able to check your own pulse for safety during independent exercise       Expected Outcomes  Short Term: Able to explain why pulse checking is important during independent exercise;Long Term: Able to check pulse independently and accurately       Understanding of Exercise Prescription  Yes       Intervention  Provide education, explanation, and written materials on patient's individual exercise prescription       Expected Outcomes  Short Term: Able to explain program exercise prescription;Long Term: Able to explain home exercise prescription to exercise independently          Exercise Goals Re-Evaluation : Exercise Goals Re-Evaluation    Row Name 01/09/18 1429 02/10/18 0745 03/03/18 0757         Exercise Goal Re-Evaluation   Exercise Goals Review  Increase Physical Activity;Increase Strength and Stamina;Knowledge and understanding of Target Heart Rate Range (THRR)  Increase Physical Activity;Increase Strength and Stamina;Knowledge and understanding of Target Heart Rate Range (THRR)  Increase Physical Activity;Increase Strength and Stamina;Knowledge and understanding of Target Heart Rate Range (THRR)     Comments  Patient is doing well in PR and has increased his SPMs on the Nustep. He has  jsut started the program but will be progressed more in time. We will help him reach his goals of breathing better and getting back into better shape.   Patient continues to do well in PR. He has increased his level on the Nustep to level three and has maintained his speed on the treadmill as well. Patient states that he feels stronger throughout the program and has stated he can do more when not in the program.   Patient is doing well in PR and has increased his speed on the treadmill to 2.6. Patient has maintained his level on the nustep machine and has kept his watts and SPMs the same levels. Patient states that he feels stronger from the program and more stamina as well so that he is able to do more yard work around the house.      Expected Outcomes  Patient wishes to breathe better and to get back into better shape  Patient wishes to be able to breathe better and to get back into better shape.   Patient wishes to be able to breathe better and to get back into better shape.         Discharge Exercise Prescription (Final Exercise Prescription Changes): Exercise Prescription Changes - 03/03/18 0700  Response to Exercise   Blood Pressure (Admit)  128/68    Blood Pressure (Exercise)  168/72    Blood Pressure (Exit)  134/60    Heart Rate (Admit)  70 bpm    Heart Rate (Exercise)  96 bpm    Heart Rate (Exit)  95 bpm    Oxygen Saturation (Admit)  92 %    Oxygen Saturation (Exercise)  93 %    Oxygen Saturation (Exit)  94 %    Rating of Perceived Exertion (Exercise)  11    Perceived Dyspnea (Exercise)  11    Duration  Progress to 30 minutes of  aerobic without signs/symptoms of physical distress    Intensity  THRR New 103-102-136      Progression   Progression  Continue to progress workloads to maintain intensity without signs/symptoms of physical distress.      Resistance Training   Training Prescription  Yes    Weight  2    Reps  10-15      Treadmill   MPH  2.6    Grade  0     Minutes  15    METs  2.9      NuStep   Level  3    SPM  117    Minutes  20    METs  3.7      Home Exercise Plan   Plans to continue exercise at  Home (comment)    Frequency  Add 2 additional days to program exercise sessions.    Initial Home Exercises Provided  01/08/17       Nutrition:  Target Goals: Understanding of nutrition guidelines, daily intake of sodium 1500mg , cholesterol 200mg , calories 30% from fat and 7% or less from saturated fats, daily to have 5 or more servings of fruits and vegetables.  Biometrics: Pre Biometrics - 01/01/18 0939      Pre Biometrics   Height  5\' 9"  (1.753 m)    Weight  227 lb 9.6 oz (103.2 kg)    Waist Circumference  43.5 inches    Hip Circumference  42 inches    Waist to Hip Ratio  1.04 %    BMI (Calculated)  33.6    Triceps Skinfold  16 mm    % Body Fat  31.5 %    Grip Strength  63.67 kg    Flexibility  0 in    Single Leg Stand  7 seconds        Nutrition Therapy Plan and Nutrition Goals: Nutrition Therapy & Goals - 03/05/18 1251      Nutrition Therapy   RD appointment deferred  Yes      Personal Nutrition Goals   Personal Goal #2  Eats a heart healthy diet       Nutrition Assessments: Nutrition Assessments - 01/01/18 1044      MEDFICTS Scores   Pre Score  53       Nutrition Goals Re-Evaluation:   Nutrition Goals Discharge (Final Nutrition Goals Re-Evaluation):   Psychosocial: Target Goals: Acknowledge presence or absence of significant depression and/or stress, maximize coping skills, provide positive support system. Participant is able to verbalize types and ability to use techniques and skills needed for reducing stress and depression.  Initial Review & Psychosocial Screening: Initial Psych Review & Screening - 01/01/18 1053      Initial Review   Current issues with  None Identified      Family Dynamics   Good Support System?  Yes  Barriers   Psychosocial barriers to participate in program  There  are no identifiable barriers or psychosocial needs.      Screening Interventions   Interventions  Encouraged to exercise       Quality of Life Scores: Quality of Life - 01/01/18 0940      Quality of Life Scores   Health/Function Pre  20.67 %    Socioeconomic Pre  28.8 %    Psych/Spiritual Pre  26.57 %    Family Pre  28.8 %    GLOBAL Pre  24.5 %      Scores of 19 and below usually indicate a poorer quality of life in these areas.  A difference of  2-3 points is a clinically meaningful difference.  A difference of 2-3 points in the total score of the Quality of Life Index has been associated with significant improvement in overall quality of life, self-image, physical symptoms, and general health in studies assessing change in quality of life.   PHQ-9: Recent Review Flowsheet Data    Depression screen [facility] 2/9 01/01/2018 02/04/2017 10/09/2016   Decreased Interest 0 0 0   Down, Depressed, Hopeless 0 0 0   PHQ - 2 Score 0 0 0   Altered sleeping 1 - -   Tired, decreased energy 1 - -   Change in appetite 0 - -   Feeling bad or failure about yourself  0 - -   Trouble concentrating 0 - -   Moving slowly or fidgety/restless 0 - -   Suicidal thoughts 0 - -   PHQ-9 Score 2 - -   Difficult doing work/chores Somewhat difficult - -     Interpretation of Total Score  Total Score Depression Severity:  1-4 = Minimal depression, 5-9 = Mild depression, 10-14 = Moderate depression, 15-19 = Moderately severe depression, 20-27 = Severe depression   Psychosocial Evaluation and Intervention: Psychosocial Evaluation - 01/01/18 1054      Psychosocial Evaluation & Interventions   Interventions  Encouraged to exercise with the program and follow exercise prescription    Continue Psychosocial Services   No Follow up required       Psychosocial Re-Evaluation: Psychosocial Re-Evaluation    Row Name 02/11/18 1348 03/05/18 1256           Psychosocial Re-Evaluation   Current issues with  None  Identified  None Identified      Comments  Patient's initial QOL score was 24.50 and his PHQ-9 score was 2.   Patient's initial QOL score was 24.50 and his PHQ-9 score was 2.       Expected Outcomes  Patient will have no psychosocial issues identified at discharge.   Patient will have no psychosocial issues identified at discharge.       Interventions  Stress management education;Encouraged to attend Pulmonary Rehabilitation for the exercise;Relaxation education  Stress management education;Encouraged to attend Pulmonary Rehabilitation for the exercise;Relaxation education      Continue Psychosocial Services   No Follow up required  No Follow up required         Psychosocial Discharge (Final Psychosocial Re-Evaluation): Psychosocial Re-Evaluation - 03/05/18 1256      Psychosocial Re-Evaluation   Current issues with  None Identified    Comments  Patient's initial QOL score was 24.50 and his PHQ-9 score was 2.     Expected Outcomes  Patient will have no psychosocial issues identified at discharge.     Interventions  Stress management education;Encouraged to attend Pulmonary Rehabilitation  for the exercise;Relaxation education    Continue Psychosocial Services   No Follow up required        Education: Education Goals: Education classes will be provided on a weekly basis, covering required topics. Participant will state understanding/return demonstration of topics presented.  Learning Barriers/Preferences: Learning Barriers/Preferences - 01/01/18 1000      Learning Barriers/Preferences   Learning Barriers  Hearing    Learning Preferences  Group Instruction;Skilled Demonstration       Education Topics: How Lungs Work and Diseases: - Discuss the anatomy of the lungs and diseases that can affect the lungs, such as COPD.   PULMONARY REHAB OTHER RESPIRATORY from 03/05/2018 in [place]  Date  02/05/18  Educator  DJ  Instruction Review Code  2- Demonstrated  Understanding      Exercise: -Discuss the importance of exercise, FITT principles of exercise, normal and abnormal responses to exercise, and how to exercise safely.   PULMONARY REHAB OTHER RESPIRATORY from 03/05/2018 in [place]  Date  01/29/18  Educator  [place]  Instruction Review Code  2- Demonstrated Understanding      Environmental Irritants: -Discuss types of environmental irritants and how to limit exposure to environmental irritants.   PULMONARY REHAB OTHER RESPIRATORY from 03/05/2018 in [place]  Date  02/12/18  Educator  DC  Instruction Review Code  2- Demonstrated Understanding      Meds/Inhalers and oxygen: - Discuss respiratory medications, definition of an inhaler and oxygen, and the proper way to use an inhaler and oxygen.   PULMONARY REHAB OTHER RESPIRATORY from 03/05/2018 in [place]  Date  02/19/18  Educator  DC      Energy Saving Techniques: - Discuss methods to conserve energy and decrease shortness of breath when performing activities of daily living.    PULMONARY REHAB OTHER RESPIRATORY from 03/05/2018 in [place]  Date  02/26/18  Educator  DC  Instruction Review Code  2- Demonstrated Understanding      Bronchial Hygiene / Breathing Techniques: - Discuss breathing mechanics, pursed-lip breathing technique,  proper posture, effective ways to clear airways, and other functional breathing techniques   PULMONARY REHAB OTHER RESPIRATORY from 03/05/2018 in [place]  Date  03/05/18  Educator  DJ  Instruction Review Code  2- Demonstrated Understanding      Cleaning Equipment: - Provides group verbal and written instruction about the health risks of elevated stress, cause of high stress, and healthy ways to reduce stress.   Nutrition I: Fats: - Discuss the types of cholesterol, what cholesterol does to the body, and how cholesterol levels can  be controlled.   Nutrition II: Labels: -Discuss the different components of food labels and how to read food labels.   Respiratory Infections: - Discuss the signs and symptoms of respiratory infections, ways to prevent respiratory infections, and the importance of seeking medical treatment when having a respiratory infection.   Stress I: Signs and Symptoms: - Discuss the causes of stress, how stress may lead to anxiety and depression, and ways to limit stress.   PULMONARY REHAB OTHER RESPIRATORY from 03/05/2018 in [place]  Date  01/08/18  Educator  Marya Amsler C.  Instruction Review Code  2- Demonstrated Understanding      Stress II: Relaxation: -Discuss relaxation techniques to limit stress.   Oxygen for Home/Travel: - Discuss how to prepare for travel when on oxygen and proper ways to transport and store oxygen  to ensure safety.   PULMONARY REHAB OTHER RESPIRATORY from 03/05/2018 in [place]  Date  01/22/18  Educator  DJ  Instruction Review Code  2- Demonstrated Understanding      Knowledge Questionnaire Score: Knowledge Questionnaire Score - 01/01/18 1012      Knowledge Questionnaire Score   Pre Score  14/18       Core Components/Risk Factors/Patient Goals at Admission: Personal Goals and Risk Factors at Admission - 01/01/18 1048      Core Components/Risk Factors/Patient Goals on Admission    Weight Management  Yes    Intervention  Weight Management/Obesity: Establish reasonable short term and long term weight goals.    Admit Weight  227 lb 9.6 oz (103.2 kg)    Goal Weight: Short Term  217 lb 9.6 oz (98.7 kg)    Goal Weight: Long Term  207 lb 9.6 oz (94.2 kg)    Expected Outcomes  Short Term: Continue to assess and modify interventions until short term weight is achieved;Long Term: Adherence to nutrition and physical activity/exercise program aimed toward attainment of established weight goal    Personal Goal Other  Yes     Personal Goal  Breathe better, lose 45lbs, get back in physical shape    Intervention  Attend program 2 x week and supplement with home exercise 3 x week.     Expected Outcomes  Attain personal goals.        Core Components/Risk Factors/Patient Goals Review:  Goals and Risk Factor Review    Row Name 02/11/18 1344 03/05/18 1252           Core Components/Risk Factors/Patient Goals Review   Personal Goals Review  Weight Management/Obesity;Improve shortness of breath with ADL's Breathe better; be in better shape; lose 45 lbs long term; play golf and visit again.   Weight Management/Obesity;Improve shortness of breath with ADL's Breathe better; be in better shape; lose 45 lbs long term; Play golf and visit again.      Review  Patient has completed 11 sessions losing 4 lbs. He is doing well in the program with progression. He says he is getting less SOB with activities and says he had noted less hoarsness when he talks. He feels better overall and is stronger. He played golf last week without difficulty and is able to visit some again. Will continue to monitor.   Patient has completed 18 sessions losing 4 lbs. He continues to do well in the program with continued progression. He continues to say he is getting better with less SOB. He is still hoarse but is breathing better. He has not played golf recently due to osteoarthritis but does feel strong enough to play. Will continue to monitor.      Expected Outcomes  Patient will continue to attend sessions and complete the program meeting his personal goals.   Patient will continue to attend sessions and complete the program meeting his personal goals.          Core Components/Risk Factors/Patient Goals at Discharge (Final Review):  Goals and Risk Factor Review - 03/05/18 1252      Core Components/Risk Factors/Patient Goals Review   Personal Goals Review  Weight Management/Obesity;Improve shortness of breath with ADL's Breathe better; be in better  shape; lose 45 lbs long term; Play golf and visit again.    Review  Patient has completed 18 sessions losing 4 lbs. He continues to do well in the program with continued progression. He continues to say he  is getting better with less SOB. He is still hoarse but is breathing better. He has not played golf recently due to osteoarthritis but does feel strong enough to play. Will continue to monitor.    Expected Outcomes  Patient will continue to attend sessions and complete the program meeting his personal goals.        ITP Comments: ITP Comments    Row Name 01/12/18 1254           ITP Comments  Patient new to program completing 3 sessions. Will continue to monitor.           Comments: ITP 30 Day REVIEW Pt is making expected progress toward pulmonary rehab goals after completing 18 sessions. Recommend continued exercise, life style modification, education, and utilization of breathing techniques to increase stamina and strength and decrease shortness of breath with exertion.

## 2018-03-10 ENCOUNTER — Encounter (HOSPITAL_COMMUNITY)
Admission: RE | Admit: 2018-03-10 | Discharge: 2018-03-10 | Disposition: A | Discharge status: Home / Self Care | Payer: Medicare Other | Source: Ambulatory Visit | Attending: Internal Medicine | Admitting: Internal Medicine

## 2018-03-10 ENCOUNTER — Other Ambulatory Visit: Payer: Self-pay | Admitting: Rheumatology

## 2018-03-10 DIAGNOSIS — R0609 Other forms of dyspnea: Principal | ICD-10-CM

## 2018-03-10 DIAGNOSIS — R0600 Dyspnea, unspecified: Secondary | ICD-10-CM

## 2018-03-10 NOTE — Telephone Encounter (Signed)
Last Visit: 12/15/17  Next visit: 03/18/18 Labs: 02/23/18 Potassium is low  Okay to refill per Dr. Estanislado Pandy

## 2018-03-10 NOTE — Progress Notes (Signed)
Daily Session Note  Patient Details  Name: [name] MRN: [id number] Date of Birth: 1951/05/16 Referring Provider:     PULMONARY REHAB OTHER RESP ORIENTATION from 01/01/2018 in [place]  Referring Provider  Dr. Chase Caller      Encounter Date: 03/10/2018  Check In: Session Check In - 03/10/18 1050      Check-In   Location  [facility]    Staff Present  [name], MS, EP, [facility], Exercise Physiologist;Derwood Becraft Luther Parody, BS, EP, Exercise Physiologist;Debra Wynetta Emery, RN, BSN    Supervising physician immediately available to respond to emergencies  See telemetry face sheet for immediately available MD    Medication changes reported      No    Fall or balance concerns reported     No    Tobacco Cessation  No Change    Warm-up and Cool-down  Performed as group-led instruction    Resistance Training Performed  Yes    VAD Patient?  No      Pain Assessment   Currently in Pain?  No/denies    Pain Score  0-No pain    Multiple Pain Sites  No       Capillary Blood Glucose: No results found for this or any previous visit (from the past 24 hour(s)).    Social History   Tobacco Use  Smoking Status Former Smoker  . Packs/day: 3.00  . Years: 20.00  . Pack years: 60.00  . Types: Cigarettes  . Last attempt to quit: 12/30/1989  . Years since quitting: 28.2  Smokeless Tobacco Never Used    Goals Met:  Independence with exercise equipment Improved SOB with ADL's Using PLB without cueing & demonstrates good technique Exercise tolerated well No report of cardiac concerns or symptoms Strength training completed today  Goals Unmet:  Not Applicable  Comments: Check out 1145   Dr. Sinda Du is Medical Director for [facility] Pulmonary Rehab.

## 2018-03-11 ENCOUNTER — Ambulatory Visit (INDEPENDENT_AMBULATORY_CARE_PROVIDER_SITE_OTHER): Payer: Medicare Other | Admitting: Urology

## 2018-03-11 DIAGNOSIS — C61 Malignant neoplasm of prostate: Secondary | ICD-10-CM

## 2018-03-11 DIAGNOSIS — R3915 Urgency of urination: Secondary | ICD-10-CM

## 2018-03-12 ENCOUNTER — Encounter (HOSPITAL_COMMUNITY)
Admission: RE | Admit: 2018-03-12 | Discharge: 2018-03-12 | Disposition: A | Discharge status: Home / Self Care | Payer: Medicare Other | Source: Ambulatory Visit | Attending: Internal Medicine | Admitting: Internal Medicine

## 2018-03-12 DIAGNOSIS — R0609 Other forms of dyspnea: Principal | ICD-10-CM

## 2018-03-12 DIAGNOSIS — R0600 Dyspnea, unspecified: Secondary | ICD-10-CM

## 2018-03-12 NOTE — Progress Notes (Signed)
Daily Session Note  Patient Details  Name: Kyllian D Lenart MRN: [id number] Date of Birth: 11/19/1951 Referring Provider:     PULMONARY REHAB OTHER RESP ORIENTATION from 01/01/2018 in [place] C[facility]  Referring Provider  Dr. Ramaswamy      Encounter Date: 03/12/2018  Check In: Session Check In - 03/12/18 1043      Check-In   Location  [facility]    Staff Present  Diane Coad, MS, EP, [facility], Exercise Physiologist;Jj Enyeart, BS, EP, Exercise Physiologist;Debra Johnson, RN, BSN    Supervising physician immediately available to respond to emergencies  See telemetry face sheet for immediately available MD    Medication changes reported      No    Fall or balance concerns reported     No    Tobacco Cessation  No Change    Warm-up and Cool-down  Performed as group-led instruction    Resistance Training Performed  Yes    VAD Patient?  No      Pain Assessment   Currently in Pain?  No/denies    Pain Score  0-No pain    Multiple Pain Sites  No       Capillary Blood Glucose: No results found for this or any previous visit (from the past 24 hour(s)).    Social History   Tobacco Use  Smoking Status Former Smoker  . Packs/day: 3.00  . Years: 20.00  . Pack years: 60.00  . Types: Cigarettes  . Last attempt to quit: 12/30/1989  . Years since quitting: 28.2  Smokeless Tobacco Never Used    Goals Met:  Independence with exercise equipment Improved SOB with ADL's Using PLB without cueing & demonstrates good technique Exercise tolerated well No report of cardiac concerns or symptoms Strength training completed today  Goals Unmet:  Not Applicable  Comments: Check out 1145   Dr. Edward Hawkins is Medical Director for [place] [facility]. 

## 2018-03-17 ENCOUNTER — Encounter (HOSPITAL_COMMUNITY)
Admission: RE | Admit: 2018-03-17 | Discharge: 2018-03-17 | Disposition: A | Discharge status: Home / Self Care | Payer: Medicare Other | Source: Ambulatory Visit | Attending: Internal Medicine | Admitting: Internal Medicine

## 2018-03-17 DIAGNOSIS — R0609 Other forms of dyspnea: Principal | ICD-10-CM

## 2018-03-17 DIAGNOSIS — R0600 Dyspnea, unspecified: Secondary | ICD-10-CM | POA: Diagnosis not present

## 2018-03-17 NOTE — Progress Notes (Signed)
Daily Session Note  Patient Details  Name: [name] MRN: [id number] Date of Birth: 15-Jul-1951 Referring Provider:     PULMONARY REHAB OTHER RESP ORIENTATION from 01/01/2018 in [place]  Referring Provider  Dr. Chase Caller      Encounter Date: 03/17/2018  Check In: Session Check In - 03/17/18 1054      Check-In   Location  [facility]    Staff Present  [name], MS, EP, [facility], Exercise Physiologist;Janya Eveland Luther Parody, BS, EP, Exercise Physiologist    Supervising physician immediately available to respond to emergencies  See telemetry face sheet for immediately available MD    Medication changes reported      No    Fall or balance concerns reported     No    Tobacco Cessation  No Change    Warm-up and Cool-down  Performed as group-led instruction    Resistance Training Performed  Yes    VAD Patient?  No      Pain Assessment   Currently in Pain?  No/denies    Pain Score  0-No pain    Multiple Pain Sites  No       Capillary Blood Glucose: No results found for this or any previous visit (from the past 24 hour(s)).    Social History   Tobacco Use  Smoking Status Former Smoker  . Packs/day: 3.00  . Years: 20.00  . Pack years: 60.00  . Types: Cigarettes  . Last attempt to quit: 12/30/1989  . Years since quitting: 28.2  Smokeless Tobacco Never Used    Goals Met:  Independence with exercise equipment Improved SOB with ADL's Using PLB without cueing & demonstrates good technique Exercise tolerated well No report of cardiac concerns or symptoms Strength training completed today  Goals Unmet:  Not Applicable  Comments: Check out 1145   Dr. Sinda Du is Medical Director for [facility] Pulmonary Rehab.

## 2018-03-18 ENCOUNTER — Ambulatory Visit (INDEPENDENT_AMBULATORY_CARE_PROVIDER_SITE_OTHER): Payer: Medicare Other | Admitting: Rheumatology

## 2018-03-18 ENCOUNTER — Ambulatory Visit (INDEPENDENT_AMBULATORY_CARE_PROVIDER_SITE_OTHER): Payer: Self-pay

## 2018-03-18 ENCOUNTER — Ambulatory Visit (INDEPENDENT_AMBULATORY_CARE_PROVIDER_SITE_OTHER): Payer: Medicare Other

## 2018-03-18 ENCOUNTER — Encounter: Payer: Self-pay | Admitting: Rheumatology

## 2018-03-18 VITALS — BP 141/85 | HR 73 | Ht 69.0 in | Wt 217.0 lb

## 2018-03-18 DIAGNOSIS — G8929 Other chronic pain: Secondary | ICD-10-CM

## 2018-03-18 DIAGNOSIS — Z86010 Personal history of colonic polyps: Secondary | ICD-10-CM

## 2018-03-18 DIAGNOSIS — M19072 Primary osteoarthritis, left ankle and foot: Secondary | ICD-10-CM

## 2018-03-18 DIAGNOSIS — M1A09X0 Idiopathic chronic gout, multiple sites, without tophus (tophi): Secondary | ICD-10-CM | POA: Diagnosis not present

## 2018-03-18 DIAGNOSIS — M0579 Rheumatoid arthritis with rheumatoid factor of multiple sites without organ or systems involvement: Secondary | ICD-10-CM | POA: Diagnosis not present

## 2018-03-18 DIAGNOSIS — M542 Cervicalgia: Secondary | ICD-10-CM | POA: Diagnosis not present

## 2018-03-18 DIAGNOSIS — M79641 Pain in right hand: Secondary | ICD-10-CM

## 2018-03-18 DIAGNOSIS — M170 Bilateral primary osteoarthritis of knee: Secondary | ICD-10-CM

## 2018-03-18 DIAGNOSIS — M19071 Primary osteoarthritis, right ankle and foot: Secondary | ICD-10-CM

## 2018-03-18 DIAGNOSIS — Z79899 Other long term (current) drug therapy: Secondary | ICD-10-CM

## 2018-03-18 DIAGNOSIS — M19041 Primary osteoarthritis, right hand: Secondary | ICD-10-CM | POA: Diagnosis not present

## 2018-03-18 DIAGNOSIS — Z860101 Personal history of adenomatous and serrated colon polyps: Secondary | ICD-10-CM

## 2018-03-18 DIAGNOSIS — Z8669 Personal history of other diseases of the nervous system and sense organs: Secondary | ICD-10-CM

## 2018-03-18 DIAGNOSIS — M546 Pain in thoracic spine: Secondary | ICD-10-CM

## 2018-03-18 DIAGNOSIS — Z8546 Personal history of malignant neoplasm of prostate: Secondary | ICD-10-CM

## 2018-03-18 DIAGNOSIS — Z8679 Personal history of other diseases of the circulatory system: Secondary | ICD-10-CM | POA: Diagnosis not present

## 2018-03-18 DIAGNOSIS — M79642 Pain in left hand: Secondary | ICD-10-CM

## 2018-03-18 DIAGNOSIS — Z8709 Personal history of other diseases of the respiratory system: Secondary | ICD-10-CM

## 2018-03-18 DIAGNOSIS — M810 Age-related osteoporosis without current pathological fracture: Secondary | ICD-10-CM

## 2018-03-18 DIAGNOSIS — M19042 Primary osteoarthritis, left hand: Secondary | ICD-10-CM

## 2018-03-18 DIAGNOSIS — Z8639 Personal history of other endocrine, nutritional and metabolic disease: Secondary | ICD-10-CM

## 2018-03-18 DIAGNOSIS — Z86718 Personal history of other venous thrombosis and embolism: Secondary | ICD-10-CM

## 2018-03-18 MED ORDER — ALENDRONATE SODIUM 70 MG PO TABS: ORAL_TABLET | ORAL | 0 refills | Status: DC

## 2018-03-19 ENCOUNTER — Other Ambulatory Visit: Payer: Self-pay | Admitting: Rheumatology

## 2018-03-19 ENCOUNTER — Encounter (HOSPITAL_COMMUNITY)
Admission: RE | Admit: 2018-03-19 | Discharge: 2018-03-19 | Disposition: A | Discharge status: Home / Self Care | Payer: Medicare Other | Source: Ambulatory Visit | Attending: Internal Medicine | Admitting: Internal Medicine

## 2018-03-19 DIAGNOSIS — R0600 Dyspnea, unspecified: Secondary | ICD-10-CM | POA: Diagnosis not present

## 2018-03-19 DIAGNOSIS — R0609 Other forms of dyspnea: Principal | ICD-10-CM

## 2018-03-19 LAB — BASIC METABOLIC PANEL WITH GFR
BUN: 11 mg/dL (ref 7–25)
CO2: 36 mmol/L — ABNORMAL HIGH (ref 20–32)
Calcium: 9.1 mg/dL (ref 8.6–10.3)
Chloride: 96 mmol/L — ABNORMAL LOW (ref 98–110)
Creat: 1.24 mg/dL (ref 0.70–1.25)
GFR, Est African American: 69 mL/min/{1.73_m2} (ref >=60)
GFR, Est Non African American: 60 mL/min/{1.73_m2} (ref >=60)
Glucose, Bld: 120 mg/dL — ABNORMAL HIGH (ref 65–99)
Potassium: 3.2 mmol/L — ABNORMAL LOW (ref 3.5–5.3)
Sodium: 142 mmol/L (ref 135–146)

## 2018-03-19 LAB — URIC ACID: Uric Acid, Serum: 7.3 mg/dL (ref 4.0–8.0)

## 2018-03-19 LAB — VITAMIN D 25 HYDROXY (VIT D DEFICIENCY, FRACTURES): Vit D, 25-Hydroxy: 54 ng/mL (ref 30–100)

## 2018-03-19 NOTE — Progress Notes (Signed)
K is low. Please have him contact his PCP and forward labs to his PCP.

## 2018-03-19 NOTE — Progress Notes (Signed)
Daily Session Note  Patient Details  Name: [name] MRN: [id number] Date of Birth: 11-19-1951 Referring Provider:     PULMONARY REHAB OTHER RESP ORIENTATION from 01/01/2018 in [place]  Referring Provider  Dr. Chase Caller      Encounter Date: 03/19/2018  Check In: Session Check In - 03/19/18 1038      Check-In   Location  [facility]    Staff Present  [name], MS, EP, [facility], Exercise Physiologist;Romolo Sieling Luther Parody, BS, EP, Exercise Physiologist;Debra Wynetta Emery, RN, BSN    Supervising physician immediately available to respond to emergencies  See telemetry face sheet for immediately available MD    Medication changes reported      No    Fall or balance concerns reported     No    Tobacco Cessation  No Change    Warm-up and Cool-down  Performed as group-led instruction    Resistance Training Performed  Yes    VAD Patient?  No      Pain Assessment   Currently in Pain?  No/denies    Pain Score  0-No pain    Multiple Pain Sites  No       Capillary Blood Glucose: Results for orders placed or performed in visit on 03/18/18 (from the past 24 hour(s))  BASIC METABOLIC PANEL WITH GFR     Status: Abnormal   Collection Time: 03/18/18 11:07 AM  Result Value Ref Range   Glucose, Bld 120 (H) 65 - 99 mg/dL   BUN 11 7 - 25 mg/dL   Creat 1.24 0.70 - 1.25 mg/dL   GFR, Est Non African American 60 > OR = 60 mL/min/1.68m   GFR, Est African American 69 > OR = 60 mL/min/1.735m  BUN/Creatinine Ratio NOT APPLICABLE 6 - 22 (calc)   Sodium 142 135 - 146 mmol/L   Potassium 3.2 (L) 3.5 - 5.3 mmol/L   Chloride 96 (L) 98 - 110 mmol/L   CO2 36 (H) 20 - 32 mmol/L   Calcium 9.1 8.6 - 10.3 mg/dL  Uric acid     Status: None   Collection Time: 03/18/18 11:07 AM  Result Value Ref Range   Uric Acid, Serum 7.3 4.0 - 8.0 mg/dL  VITAMIN D 25 Hydroxy (Vit-D Deficiency, Fractures)     Status: None   Collection Time: 03/18/18 11:07 AM  Result Value Ref Range   Vit  D, 25-Hydroxy 54 30 - 100 ng/mL    Exercise Prescription Changes - 03/19/18 0700      Response to Exercise   Blood Pressure (Admit)  140/80    Blood Pressure (Exercise)  140/66    Blood Pressure (Exit)  136/70    Heart Rate (Admit)  66 bpm    Heart Rate (Exercise)  97 bpm    Heart Rate (Exit)  88 bpm    Oxygen Saturation (Admit)  96 %    Oxygen Saturation (Exercise)  90 %    Oxygen Saturation (Exit)  94 %    Rating of Perceived Exertion (Exercise)  11    Perceived Dyspnea (Exercise)  11    Duration  Progress to 30 minutes of  aerobic without signs/symptoms of physical distress    Intensity  THRR [place] [phone]    Progression   Progression  Continue to progress workloads to maintain intensity without signs/symptoms of physical distress.      Resistance Training   Training Prescription  Yes    Weight  2  Reps  10-15      Treadmill   MPH  2.6    Grade  0    Minutes  15    METs  2.9      NuStep   Level  3    SPM  106    Minutes  20    METs  3.3      Home Exercise Plan   Plans to continue exercise at  Home (comment)    Frequency  Add 2 additional days to program exercise sessions.    Initial Home Exercises Provided  01/08/17       Social History   Tobacco Use  Smoking Status Former Smoker  . Packs/day: 3.00  . Years: 20.00  . Pack years: 60.00  . Types: Cigarettes  . Last attempt to quit: 12/30/1989  . Years since quitting: 28.2  Smokeless Tobacco Never Used    Goals Met:  Independence with exercise equipment Improved SOB with ADL's Using PLB without cueing & demonstrates good technique Exercise tolerated well No report of cardiac concerns or symptoms Strength training completed today  Goals Unmet:  Not Applicable  Comments: Check out 1145   Dr. Sinda Du is Medical Director for [facility] Pulmonary Rehab.

## 2018-03-23 ENCOUNTER — Ambulatory Visit (HOSPITAL_COMMUNITY)
Admission: RE | Admit: 2018-03-23 | Discharge: 2018-03-23 | Disposition: A | Discharge status: Home / Self Care | Payer: Medicare Other | Source: Ambulatory Visit | Attending: Rheumatology | Admitting: Rheumatology

## 2018-03-23 DIAGNOSIS — M0579 Rheumatoid arthritis with rheumatoid factor of multiple sites without organ or systems involvement: Secondary | ICD-10-CM | POA: Insufficient documentation

## 2018-03-23 MED ORDER — ACETAMINOPHEN 325 MG PO TABS: 650.0000 mg | ORAL_TABLET | ORAL | Status: DC

## 2018-03-23 MED ORDER — SODIUM CHLORIDE 0.9 % IV SOLN
4.0000 mg/kg | INTRAVENOUS | Status: DC
  Administered 2018-03-23: 400 mg via INTRAVENOUS
  Filled 2018-03-23: qty 20

## 2018-03-23 MED ORDER — DIPHENHYDRAMINE HCL 25 MG PO CAPS: 25.0000 mg | ORAL_CAPSULE | ORAL | Status: DC

## 2018-03-24 ENCOUNTER — Encounter (HOSPITAL_COMMUNITY): Payer: Medicare Other

## 2018-03-26 ENCOUNTER — Encounter (HOSPITAL_COMMUNITY)
Admission: RE | Admit: 2018-03-26 | Discharge: 2018-03-26 | Disposition: A | Discharge status: Home / Self Care | Payer: Medicare Other | Source: Ambulatory Visit | Attending: Internal Medicine | Admitting: Internal Medicine

## 2018-03-26 DIAGNOSIS — R0609 Other forms of dyspnea: Principal | ICD-10-CM

## 2018-03-26 DIAGNOSIS — R0600 Dyspnea, unspecified: Secondary | ICD-10-CM | POA: Diagnosis not present

## 2018-03-26 NOTE — Progress Notes (Signed)
Daily Session Note  Patient Details  Name: [name] MRN: [id number] Date of Birth: 09/19/1951 Referring Provider:     PULMONARY REHAB OTHER RESP ORIENTATION from 01/01/2018 in [place] CARDIAC REHABILITATION  Referring Provider  Dr. Ramaswamy      Encounter Date: 03/26/2018  Check In: Session Check In - 03/26/18 1045      Check-In   Location  [facility]    Staff Present  Diane Coad, MS, EP, [facility], Exercise Physiologist;Gregory Cowan, BS, EP, Exercise Physiologist; , RN, BSN    Supervising physician immediately available to respond to emergencies  See telemetry face sheet for immediately available MD    Medication changes reported      No    Fall or balance concerns reported     No    Warm-up and Cool-down  Performed as group-led instruction    Resistance Training Performed  Yes    VAD Patient?  No      Pain Assessment   Currently in Pain?  No/denies    Pain Score  0-No pain    Multiple Pain Sites  No       Capillary Blood Glucose: No results found for this or any previous visit (from the past 24 hour(s)).    Social History   Tobacco Use  Smoking Status Former Smoker  . Packs/day: 3.00  . Years: 20.00  . Pack years: 60.00  . Types: Cigarettes  . Last attempt to quit: 12/30/1989  . Years since quitting: 28.2  Smokeless Tobacco Never Used    Goals Met:  Proper associated with RPD/PD & O2 Sat Independence with exercise equipment Improved SOB with ADL's Using PLB without cueing & demonstrates good technique Exercise tolerated well No report of cardiac concerns or symptoms Strength training completed today  Goals Unmet:  Not Applicable  Comments: Check out 1145.   Dr. Edward Hawkins is Medical Director for [place] [facility] Re[facility]. 

## 2018-03-31 ENCOUNTER — Encounter (HOSPITAL_COMMUNITY)
Admission: RE | Admit: 2018-03-31 | Discharge: 2018-03-31 | Disposition: A | Discharge status: Home / Self Care | Payer: Medicare Other | Source: Ambulatory Visit | Attending: Internal Medicine | Admitting: Internal Medicine

## 2018-03-31 ENCOUNTER — Telehealth: Payer: Self-pay | Admitting: Rheumatology

## 2018-03-31 DIAGNOSIS — R0600 Dyspnea, unspecified: Secondary | ICD-10-CM | POA: Insufficient documentation

## 2018-03-31 DIAGNOSIS — E876 Hypokalemia: Secondary | ICD-10-CM | POA: Diagnosis not present

## 2018-03-31 DIAGNOSIS — Z6832 Body mass index (BMI) 32.0-32.9, adult: Secondary | ICD-10-CM | POA: Diagnosis not present

## 2018-03-31 NOTE — Telephone Encounter (Signed)
Patient at Dr. Cornelia Copa office now, and needs lab results, specifically Potassium level, faxed to them. Fax# [phone]

## 2018-03-31 NOTE — Progress Notes (Signed)
Daily Session Note  Patient Details  Name: [name] MRN: [id number] Date of Birth: 04-24-1951 Referring Provider:     PULMONARY REHAB OTHER RESP ORIENTATION from 01/01/2018 in [place]  Referring Provider  Dr. Chase Caller      Encounter Date: 03/31/2018  Check In: Session Check In - 03/31/18 1045      Check-In   Location  [facility]    Staff Present  Cortlynn Hollinsworth Angelina Pih, MS, EP, [facility], Exercise Physiologist;Gregory Luther Parody, BS, EP, Exercise Physiologist    Supervising physician immediately available to respond to emergencies  See telemetry face sheet for immediately available MD    Medication changes reported      No    Fall or balance concerns reported     No    Tobacco Cessation  No Change    Warm-up and Cool-down  Performed as group-led instruction    Resistance Training Performed  Yes    VAD Patient?  No      Pain Assessment   Currently in Pain?  No/denies    Pain Score  0-No pain    Multiple Pain Sites  No       Capillary Blood Glucose: No results found for this or any previous visit (from the past 24 hour(s)).    Social History   Tobacco Use  Smoking Status Former Smoker  . Packs/day: 3.00  . Years: 20.00  . Pack years: 60.00  . Types: Cigarettes  . Last attempt to quit: 12/30/1989  . Years since quitting: 28.2  Smokeless Tobacco Never Used    Goals Met:  Proper associated with RPD/PD & O2 Sat Independence with exercise equipment Exercise tolerated well No report of cardiac concerns or symptoms Strength training completed today  Goals Unmet:  Not Applicable  Comments: Check out: 11:45   Dr. Sinda Du is Medical Director for [facility] Pulmonary Rehab.

## 2018-03-31 NOTE — Telephone Encounter (Signed)
Copy of lab re-faxed to PCP.

## 2018-04-02 ENCOUNTER — Encounter (HOSPITAL_COMMUNITY)
Admission: RE | Admit: 2018-04-02 | Discharge: 2018-04-02 | Disposition: A | Discharge status: Home / Self Care | Payer: Medicare Other | Source: Ambulatory Visit | Attending: Internal Medicine | Admitting: Internal Medicine

## 2018-04-02 DIAGNOSIS — R0600 Dyspnea, unspecified: Secondary | ICD-10-CM | POA: Diagnosis not present

## 2018-04-02 DIAGNOSIS — R0609 Other forms of dyspnea: Principal | ICD-10-CM

## 2018-04-02 NOTE — Progress Notes (Signed)
Pulmonary Individual Treatment Plan  Patient Details  Name: [name] MRN: [id number] Date of Birth: [birth year] Referring Provider:     PULMONARY REHAB OTHER RESP ORIENTATION from 01/01/2018 in [facility]  Referring Provider  Dr. Chase Caller      Initial Encounter Date:    [place] from 01/01/2018 in [facility]  Date  01/01/18  Referring Provider  Dr. Chase Caller      Visit Diagnosis: Dyspnea on exertion  Patient's Home Medications on Admission:   Current Outpatient Medications:  .  acetaminophen (TYLENOL) 500 MG tablet, Take 1,000 mg by mouth every 6 (six) hours as needed (arthritis pain)., Disp: , Rfl:  .  albuterol (PROVENTIL) (2.5 MG/3ML) 0.083% nebulizer solution, Take 3 mLs (2.5 mg total) by nebulization every 6 (six) hours as needed for wheezing or shortness of breath., Disp: 75 mL, Rfl: 12 .  albuterol (PROVENTIL) 2 MG tablet, Take 2 mg by mouth 2 (two) times daily., Disp: , Rfl:  .  alendronate (FOSAMAX) 70 MG tablet, TAKE 1 TABLET BY MOUTH ONCE A WEEK. TAKE WITH FULL GLASS OF WATER ON EMPTY STOMACH, Disp: 12 tablet, Rfl: 0 .  allopurinol (ZYLOPRIM) 100 MG tablet, TAKE 1 TABLET BY MOUTH TWICE A DAY, Disp: 180 tablet, Rfl: 0 .  Calcium-Phosphorus-Vitamin D (CITRACAL +D3 PO), Take 2 tablets by mouth 2 (two) times daily. , Disp: , Rfl:  .  dicyclomine (BENTYL) 10 MG capsule, TAKE 1 CAPSULE BY MOUTH 4 TIMES DAILY BEFORE MEALS AND AT BEDTIME (Patient taking differently: TAKE 2 CAPSULE BY MOUTH TWO TIMES DAILY), Disp: 120 capsule, Rfl: 0 .  fluticasone furoate-vilanterol (BREO ELLIPTA) 100-25 MCG/INH AEPB, Inhale 1 puff into the lungs daily. (Patient not taking: Reported on 03/18/2018), Disp: 1 each, Rfl: 4 .  folic acid (FOLVITE) 1 MG tablet, TAKE 2 TABLETS BY MOUTH EVERY MORNING, Disp: 180 tablet, Rfl: 4 .  furosemide (LASIX) 40 MG tablet, TAKE 1 TABLET BY MOUTH TWO TIMES DAILY (Patient taking differently:  TAKE 2 TABLETS BY MOUTH (Evenings)), Disp: 60 tablet, Rfl: 8 .  INCRUSE ELLIPTA 62.5 MCG/INH AEPB, INHALE 1 PUFF INTO LUNGS DAILY, Disp: 30 each, Rfl: 3 .  leflunomide (ARAVA) 20 MG tablet, TAKE 1 TABLET BY MOUTH EVERY DAY, Disp: 30 tablet, Rfl: 2 .  omeprazole (PRILOSEC) 40 MG capsule, Take 40 mg by mouth 2 (two) times daily. , Disp: , Rfl: 2 .  Oxymetazoline HCl (NASAL SPRAY NA), Place 2 sprays into the nose daily as needed (allergies)., Disp: , Rfl:  .  phenytoin (DILANTIN) 100 MG ER capsule, Take 100-200 mg by mouth 2 (two) times daily. Take one capsule in the morning and 2 capsules at bedtime, Disp: , Rfl:  .  PROAIR HFA 108 (90 Base) MCG/ACT inhaler, Inhale 1-2 puffs into the lungs every 6 (six) hours as needed. , Disp: , Rfl:  .  Tocilizumab (ACTEMRA IV), Inject into the vein every 28 (twenty-eight) days. For Rheumatoid Arthritis, Disp: , Rfl:  .  warfarin (COUMADIN) 3 MG tablet, Take 3 mg by mouth at bedtime. , Disp: , Rfl:   Past Medical History: Past Medical History:  Diagnosis Date  . Anxiety    hx of   . Arthritis   . Asthma   . Clotting disorder (HCC)    DVT both legs   . COPD (chronic obstructive pulmonary disease) ([place])   . DDD (degenerative disc disease), cervical    with UE's paresthesias  . Diverticulosis   .  DVT, lower extremity ([place])    bilat  . 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 GERD (gastroesophageal reflux disease)   . Gout   . Heart murmur   . History of measles   . History of shingles   . Hypercholesterolemia   . IBS (irritable bowel syndrome)   . IBS (irritable bowel syndrome)   . Peripheral edema   . Pneumonia    hx of   . Prostate cancer ([place])   . Seizures ([place])    last seizure 20 years ago   . Shortness of breath dyspnea    exertion   . Sleep apnea    not on cpap  . Tubular adenoma of colon 04/2008    Tobacco Use: Social History   Tobacco Use  Smoking Status Former Smoker   . Packs/day: 3.00  . Years: 20.00  . Pack years: 60.00  . Types: Cigarettes  . Last attempt to quit: 12/30/1989  . Years since quitting: 28.2  Smokeless Tobacco Never Used    Labs: Recent Review Scientist, physiological    Labs for ITP Cardiac and Pulmonary Rehab Latest Ref Rng & Units 07/01/2010   TCO2 0 - 100 mmol/L 27      Capillary Blood Glucose: Lab Results  Component Value Date   GLUCAP 101 (H) 11/28/2008     Pulmonary Assessment Scores: Pulmonary Assessment Scores    Row Name 01/01/18 1017         ADL UCSD   ADL Phase  Entry     SOB Score total  18     Rest  0     Walk  2     Stairs  2     Bath  1     Dress  1     Shop  1       CAT Score   CAT Score  10       mMRC Score   mMRC Score  2        Pulmonary Function Assessment: Pulmonary Function Assessment - 01/01/18 1013      Pulmonary Function Tests   FVC%  49 %    FEV1%  54 %    FEV1/FVC Ratio  110    DLCO%  63 %      Initial Spirometry Results   FVC%  49 %    FEV1%  54 %    FEV1/FVC Ratio  110      Post Bronchodilator Spirometry Results   FVC%  48 %    FEV1%  54 %    FEV1/FVC Ratio  113      Breath   Bilateral Breath Sounds  Clear    Shortness of Breath  No       Exercise Target Goals:    Exercise Program Goal: Individual exercise prescription set using results from initial 6 min walk test and THRR while considering  patient's activity barriers and safety.   Exercise Prescription Goal: Initial exercise prescription builds to 30-45 minutes a day of aerobic activity, 2-3 days per week.  Home exercise guidelines will be given to patient during program as part of exercise prescription that the participant will acknowledge.  Activity Barriers & Risk Stratification: Activity Barriers & Cardiac Risk Stratification - 01/01/18 0936      Activity Barriers & Cardiac Risk Stratification   Activity Barriers  Back Problems;Deconditioning    Cardiac Risk Stratification  Low       6 Minute Walk: 6  Minute Walk    Row Name 01/01/18 0932         6 Minute Walk   Phase  Initial     Distance  1200 feet     Distance % Change  0 %     Distance Feet Change  0 ft     Walk Time  6 minutes     # of Rest Breaks  0     MPH  2.27     METS  2.74     RPE  9     Perceived Dyspnea   9     VO2 Peak  9.54     Symptoms  No     Resting HR  84 bpm     Resting BP  136/67     Resting Oxygen Saturation   94 %     Exercise Oxygen Saturation  during 6 min walk  91 %     Max Ex. HR  105 bpm     Max Ex. BP  146/74     2 Minute Post BP  136/66        Oxygen Initial Assessment: Oxygen Initial Assessment - 01/01/18 1023      Home Oxygen   Home Oxygen Device  None    Sleep Oxygen Prescription  None    Home Exercise Oxygen Prescription  None    Home at Rest Exercise Oxygen Prescription  None      Initial 6 min Walk   Oxygen Used  None      Program Oxygen Prescription   Program Oxygen Prescription  None      Intervention   Short Term Goals  To learn and understand importance of monitoring SPO2 with pulse oximeter and demonstrate accurate use of the pulse oximeter.;To learn and understand importance of maintaining oxygen saturations>88%;To learn and demonstrate proper pursed lip breathing techniques or other breathing techniques.    Long  Term Goals  Verbalizes importance of monitoring SPO2 with pulse oximeter and return demonstration;Maintenance of O2 saturations>88%;Exhibits proper breathing techniques, such as pursed lip breathing or other method taught during program session       Oxygen Re-Evaluation: Oxygen Re-Evaluation    Row Name 02/11/18 1342 03/05/18 1252 04/02/18 1021         Program Oxygen Prescription   Program Oxygen Prescription  None  None  None       Home Oxygen   Home Oxygen Device  None  None  None     Sleep Oxygen Prescription  None  None  None     Home Exercise Oxygen Prescription  None  None  None     Home at Rest Exercise Oxygen Prescription  None  None  None      Compliance with Home Oxygen Use  -  Yes  -       Goals/Expected Outcomes   Short Term Goals  To learn and understand importance of monitoring SPO2 with pulse oximeter and demonstrate accurate use of the pulse oximeter.;To learn and understand importance of maintaining oxygen saturations>88%;To learn and demonstrate proper pursed lip breathing techniques or other breathing techniques.  To learn and understand importance of monitoring SPO2 with pulse oximeter and demonstrate accurate use of the pulse oximeter.;To learn and understand importance of maintaining oxygen saturations>88%;To learn and demonstrate proper pursed lip breathing techniques or other breathing techniques.  To learn and understand importance of monitoring SPO2  with pulse oximeter and demonstrate accurate use of the pulse oximeter.;To learn and understand importance of maintaining oxygen saturations>88%;To learn and demonstrate proper pursed lip breathing techniques or other breathing techniques.     Long  Term Goals  Verbalizes importance of monitoring SPO2 with pulse oximeter and return demonstration;Maintenance of O2 saturations>88%;Exhibits proper breathing techniques, such as pursed lip breathing or other method taught during program session  Verbalizes importance of monitoring SPO2 with pulse oximeter and return demonstration;Maintenance of O2 saturations>88%;Exhibits proper breathing techniques, such as pursed lip breathing or other method taught during program session  Verbalizes importance of monitoring SPO2 with pulse oximeter and return demonstration;Maintenance of O2 saturations>88%;Exhibits proper breathing techniques, such as pursed lip breathing or other method taught during program session     Comments  Patient is meeting both short and long term goals.   Patient is meeting both short and long term goals.   Patient is meeting both short and long term goals.      Goals/Expected Outcomes  Patient will continue to meet both  his short and long term goals.   Patient will continue to meet both his short and long term goals.   Patient will continue to meet both his short and long term goals.         Oxygen Discharge (Final Oxygen Re-Evaluation): Oxygen Re-Evaluation - 04/02/18 1021      Program Oxygen Prescription   Program Oxygen Prescription  None      Home Oxygen   Home Oxygen Device  None    Sleep Oxygen Prescription  None    Home Exercise Oxygen Prescription  None    Home at Rest Exercise Oxygen Prescription  None      Goals/Expected Outcomes   Short Term Goals  To learn and understand importance of monitoring SPO2 with pulse oximeter and demonstrate accurate use of the pulse oximeter.;To learn and understand importance of maintaining oxygen saturations>88%;To learn and demonstrate proper pursed lip breathing techniques or other breathing techniques.    Long  Term Goals  Verbalizes importance of monitoring SPO2 with pulse oximeter and return demonstration;Maintenance of O2 saturations>88%;Exhibits proper breathing techniques, such as pursed lip breathing or other method taught during program session    Comments  Patient is meeting both short and long term goals.     Goals/Expected Outcomes  Patient will continue to meet both his short and long term goals.        Initial Exercise Prescription: Initial Exercise Prescription - 01/01/18 0900      Date of Initial Exercise RX and Referring Provider   Date  01/01/18    Referring Provider  Dr. Chase Caller      Treadmill   MPH  2    Grade  0    Minutes  15    METs  2.5      NuStep   Level  2    SPM  97    Minutes  20    METs  1.9      Prescription Details   Frequency (times per week)  2    Duration  Progress to 30 minutes of continuous aerobic without signs/symptoms of physical distress      Intensity   THRR 40-80% of Max Heartrate  [phone]    Ratings of Perceived Exertion  11-13    Perceived Dyspnea  0-4      Progression   Progression   Continue progressive overload as per policy without signs/symptoms or physical distress.  Resistance Training   Training Prescription  Yes    Weight  1    Reps  10-15       Perform Capillary Blood Glucose checks as needed.  Exercise Prescription Changes:  Exercise Prescription Changes    Row Name 01/08/18 1200 01/09/18 1400 01/27/18 1500 02/10/18 0700 03/03/18 0700     Response to Exercise   Blood Pressure (Admit)  -  144/76  132/64  130/68  128/68   Blood Pressure (Exercise)  -  140/80  150/82  144/78  168/72   Blood Pressure (Exit)  -  138/70  132/76  130/70  134/60   Heart Rate (Admit)  -  80 bpm  77 bpm  68 bpm  70 bpm   Heart Rate (Exercise)  -  98 bpm  102 bpm  93 bpm  96 bpm   Heart Rate (Exit)  -  105 bpm  94 bpm  85 bpm  95 bpm   Oxygen Saturation (Admit)  -  93 %  93 %  93 %  92 %   Oxygen Saturation (Exercise)  -  92 %  91 %  92 %  93 %   Oxygen Saturation (Exit)  -  92 %  92 %  94 %  94 %   Rating of Perceived Exertion (Exercise)  -  11  11  11  11    Perceived Dyspnea (Exercise)  -  11  11  11  11    Duration  -  Progress to 30 minutes of  aerobic without signs/symptoms of physical distress  Progress to 30 minutes of  aerobic without signs/symptoms of physical distress  Progress to 30 minutes of  aerobic without signs/symptoms of physical distress  Progress to 30 minutes of  aerobic without signs/symptoms of physical distress   Intensity  -  THRR New [id number]  THRR New [id number]  THRR New [id number]  THRR New 103-102-136     Progression   Progression  -  Continue to progress workloads to maintain intensity without signs/symptoms of physical distress.  Continue to progress workloads to maintain intensity without signs/symptoms of physical distress.  Continue to progress workloads to maintain intensity without signs/symptoms of physical distress.  Continue to progress workloads to maintain intensity without signs/symptoms of physical distress.     Resistance  Training   Training Prescription  Yes  Yes  Yes  Yes  Yes   Weight  1  1  1  2  2    Reps  10-15  10-15  10-15  10-15  10-15     Treadmill   MPH  2  2  2.4  2.4  2.6   Grade  0  0  0  0  0   Minutes  15  15  15  15  15    METs  2.5  2.5  2.8  2.8  2.9     NuStep   Level  2  2  2  3  3    SPM  97  111  112  116  117   Minutes  20  20  20  20  20    METs  1.9  2.7  3.1  3.3  3.7     Home Exercise Plan   Plans to continue exercise at  Home (comment)  Home (comment)  Home (comment)  Home (comment)  Home (comment)   Frequency  Add 2 additional days to program exercise sessions.  Add 2 additional  days to program exercise sessions.  Add 2 additional days to program exercise sessions.  Add 2 additional days to program exercise sessions.  Add 2 additional days to program exercise sessions.   Initial Home Exercises Provided  01/08/17  01/08/17  01/08/17  01/08/17  01/08/17   Row Name 03/19/18 0700 03/27/18 1400           Response to Exercise   Blood Pressure (Admit)  140/80  124/60      Blood Pressure (Exercise)  140/66  140/66      Blood Pressure (Exit)  136/70  120/62      Heart Rate (Admit)  66 bpm  77 bpm      Heart Rate (Exercise)  97 bpm  103 bpm      Heart Rate (Exit)  88 bpm  94 bpm      Oxygen Saturation (Admit)  96 %  92 %      Oxygen Saturation (Exercise)  90 %  93 %      Oxygen Saturation (Exit)  94 %  93 %      Rating of Perceived Exertion (Exercise)  11  9      Perceived Dyspnea (Exercise)  11  9      Duration  Progress to 30 minutes of  aerobic without signs/symptoms of physical distress  Progress to 30 minutes of  aerobic without signs/symptoms of physical distress      Intensity  THRR New [id number]  THRR New [id number]        Progression   Progression  Continue to progress workloads to maintain intensity without signs/symptoms of physical distress.  Continue to progress workloads to maintain intensity without signs/symptoms of physical distress.        Resistance  Training   Training Prescription  Yes  Yes      Weight  2  2      Reps  10-15  10-15        Treadmill   MPH  2.6  2.9      Grade  0  0      Minutes  15  15      METs  2.9  2.3        NuStep   Level  3  3      SPM  106  111      Minutes  20  20      METs  3.3  3.5        Home Exercise Plan   Plans to continue exercise at  Home (comment)  Home (comment)      Frequency  Add 2 additional days to program exercise sessions.  Add 2 additional days to program exercise sessions.      Initial Home Exercises Provided  01/08/17  01/08/17         Exercise Comments:  Exercise Comments    Row Name 01/08/18 1257 01/09/18 1431 01/27/18 1519 02/10/18 0747 03/03/18 0759   Exercise Comments  Patient received the take home exercise plan today. THR was addressed as were safety guidelines for being active around the house. Patient addressed an understanding and was encouraged to ask any future questions as they arise.   Patient is doing well in PR and has increased his SPMs on the Nustep. He has jsut started the program but will be progressed more in time. We will help him reach his goals of breathing better and getting back into better shape.   Patient  is doing well in CR. He has progressed on the treadmill with a higher speed. He has also maintained his high SPMs on the Nustep with his level   Patient continues to do well in PR. He has increased his level on the Nustep to level three and has maintained his speed on the treadmill as well. Patient states that he feels stronger throughout the program and has stated he can do more when not in the program.   Patient is doing well in PR and has increased his speed on the treadmill to 2.6. Patient has maintained his level on the nustep machine and has kept his watts and SPMs the same levels. Patient states that he feels stronger from the program and more stamina as well so that he is able to do more yard work around the house.    [place] Name 03/19/18 7824 03/27/18  1408         Exercise Comments  Patient is doing well in PR and has maintained his levels on both of his machines and has stated that he feels stronger and has gained more endurance from the program and it helps him with his work around the house.   Patient continues to do do well in PR and has increased his speed on the treadmill to 2.9. 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          Exercise Goals and Review:  Exercise Goals    Row Name 01/01/18 0939             Exercise Goals   Increase Physical Activity  Yes       Intervention  Provide advice, education, support and counseling about physical activity/exercise needs.;Develop an individualized exercise prescription for aerobic and resistive training based on initial evaluation findings, risk stratification, comorbidities and participant's personal goals.       Expected Outcomes  Achievement of increased cardiorespiratory fitness and enhanced flexibility, muscular endurance and strength shown through measurements of functional capacity and personal statement of participant.       Increase Strength and Stamina  Yes       Intervention  Provide advice, education, support and counseling about physical activity/exercise needs.;Develop an individualized exercise prescription for aerobic and resistive training based on initial evaluation findings, risk stratification, comorbidities and participant's personal goals.       Expected Outcomes  Achievement of increased cardiorespiratory fitness and enhanced flexibility, muscular endurance and strength shown through measurements of functional capacity and personal statement of participant.       Able to understand and use rate of perceived exertion (RPE) scale  Yes       Intervention  Provide education and explanation on how to use RPE scale       Expected Outcomes  Short Term: Able to  use RPE daily in rehab to express subjective intensity level;Long Term:  Able to use RPE to guide intensity level when exercising independently       Able to understand and use Dyspnea scale  Yes       Intervention  Provide education and explanation on how to use Dyspnea scale       Expected Outcomes  Short Term: Able to use Dyspnea scale daily in rehab to express subjective sense of shortness of breath during exertion;Long Term: Able to use Dyspnea scale to guide intensity level when exercising independently  Knowledge and understanding of Target Heart Rate Range (THRR)  Yes       Intervention  Provide education and explanation of THRR including how the numbers were predicted and where they are located for reference       Expected Outcomes  Short Term: Able to state/look up THRR;Long Term: Able to use THRR to govern intensity when exercising independently;Short Term: Able to use daily as guideline for intensity in rehab       Able to check pulse independently  Yes       Intervention  Provide education and demonstration on how to check pulse in carotid and radial arteries.;Review the importance of being able to check your own pulse for safety during independent exercise       Expected Outcomes  Short Term: Able to explain why pulse checking is important during independent exercise;Long Term: Able to check pulse independently and accurately       Understanding of Exercise Prescription  Yes       Intervention  Provide education, explanation, and written materials on patient's individual exercise prescription       Expected Outcomes  Short Term: Able to explain program exercise prescription;Long Term: Able to explain home exercise prescription to exercise independently          Exercise Goals Re-Evaluation : Exercise Goals Re-Evaluation    Row Name 01/09/18 1429 02/10/18 0745 03/03/18 0757 03/27/18 1407       Exercise Goal Re-Evaluation   Exercise Goals Review  Increase Physical  Activity;Increase Strength and Stamina;Knowledge and understanding of Target Heart Rate Range (THRR)  Increase Physical Activity;Increase Strength and Stamina;Knowledge and understanding of Target Heart Rate Range (THRR)  Increase Physical Activity;Increase Strength and Stamina;Knowledge and understanding of Target Heart Rate Range (THRR)  Increase Physical Activity;Increase Strength and Stamina;Knowledge and understanding of Target Heart Rate Range (THRR)    Comments  Patient is doing well in PR and has increased his SPMs on the Nustep. He has jsut started the program but will be progressed more in time. We will help him reach his goals of breathing better and getting back into better shape.   Patient continues to do well in PR. He has increased his level on the Nustep to level three and has maintained his speed on the treadmill as well. Patient states that he feels stronger throughout the program and has stated he can do more when not in the program.   Patient is doing well in PR and has increased his speed on the treadmill to 2.6. Patient has maintained his level on the nustep machine and has kept his watts and SPMs the same levels. Patient states that he feels stronger from the program and more stamina as well so that he is able to do more yard work around the house.   Patient continues to do do well in PR and has increased his speed on the treadmill to 2.9. 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     Expected Outcomes  Patient wishes to breathe better and to get back into better shape  Patient wishes to be able to breathe better and to get back into better shape.   Patient wishes to be able to breathe better and to get back into better shape.   Patient wishes to be able to breathe better and to get back into better shape.  Discharge Exercise Prescription (Final Exercise Prescription  Changes): Exercise Prescription Changes - 03/27/18 1400      Response to Exercise   Blood Pressure (Admit)  124/60    Blood Pressure (Exercise)  140/66    Blood Pressure (Exit)  120/62    Heart Rate (Admit)  77 bpm    Heart Rate (Exercise)  103 bpm    Heart Rate (Exit)  94 bpm    Oxygen Saturation (Admit)  92 %    Oxygen Saturation (Exercise)  93 %    Oxygen Saturation (Exit)  93 %    Rating of Perceived Exertion (Exercise)  9    Perceived Dyspnea (Exercise)  9    Duration  Progress to 30 minutes of  aerobic without signs/symptoms of physical distress    Intensity  THRR New [phone]      Progression   Progression  Continue to progress workloads to maintain intensity without signs/symptoms of physical distress.      Resistance Training   Training Prescription  Yes    Weight  2    Reps  10-15      Treadmill   MPH  2.9    Grade  0    Minutes  15    METs  2.3      NuStep   Level  3    SPM  111    Minutes  20    METs  3.5      Home Exercise Plan   Plans to continue exercise at  Home (comment)    Frequency  Add 2 additional days to program exercise sessions.    Initial Home Exercises Provided  01/08/17       Nutrition:  Target Goals: Understanding of nutrition guidelines, daily intake of sodium 1500mg , cholesterol 200mg , calories 30% from fat and 7% or less from saturated fats, daily to have 5 or more servings of fruits and vegetables.  Biometrics: Pre Biometrics - 01/01/18 0939      Pre Biometrics   Height  5\' 9"  (1.753 m)    Weight  227 lb 9.6 oz (103.2 kg)    Waist Circumference  43.5 inches    Hip Circumference  42 inches    Waist to Hip Ratio  1.04 %    BMI (Calculated)  33.6    Triceps Skinfold  16 mm    % Body Fat  31.5 %    Grip Strength  63.67 kg    Flexibility  0 in    Single Leg Stand  7 seconds        Nutrition Therapy Plan and Nutrition Goals: Nutrition Therapy & Goals - 04/02/18 1022      Personal Nutrition Goals   Nutrition Goal   For heart healthy choices add >50% of whole grains, make half their plate fruits and vegetables. Discuss the difference between starchy vegetables and leafy greens, and how leafy vegetables provide fiber, helps maintain healthy weight, helps control blood glucose, and lowers cholesterol.  Discuss purchasing fresh or frozen vegetable to reduce sodium and not to add grease, fat or sugar. Consume <18oz of red meat per week. Consume lean cuts of meats and very little of meats high in sodium and nitrates such as pork and lunch meats. Discussed portion control for all food groups.      Personal Goal #2  Eats a heart healthy diet      Intervention Plan   Intervention  Nutrition handout(s) given to patient.    Expected  Outcomes  Short Term Goal: Understand basic principles of dietary content, such as calories, fat, sodium, cholesterol and nutrients.       Nutrition Assessments: Nutrition Assessments - 01/01/18 1044      MEDFICTS Scores   Pre Score  53       Nutrition Goals Re-Evaluation:   Nutrition Goals Discharge (Final Nutrition Goals Re-Evaluation):   Psychosocial: Target Goals: Acknowledge presence or absence of significant depression and/or stress, maximize coping skills, provide positive support system. Participant is able to verbalize types and ability to use techniques and skills needed for reducing stress and depression.  Initial Review & Psychosocial Screening: Initial Psych Review & Screening - 01/01/18 1053      Initial Review   Current issues with  None Identified      Family Dynamics   Good Support System?  Yes      Barriers   Psychosocial barriers to participate in program  There are no identifiable barriers or psychosocial needs.      Screening Interventions   Interventions  Encouraged to exercise       Quality of Life Scores: Quality of Life - 01/01/18 0940      Quality of Life Scores   Health/Function Pre  20.67 %    Socioeconomic Pre  28.8 %     Psych/Spiritual Pre  26.57 %    Family Pre  28.8 %    GLOBAL Pre  24.5 %      Scores of 19 and below usually indicate a poorer quality of life in these areas.  A difference of  2-3 points is a clinically meaningful difference.  A difference of 2-3 points in the total score of the Quality of Life Index has been associated with significant improvement in overall quality of life, self-image, physical symptoms, and general health in studies assessing change in quality of life.   PHQ-9: Recent Review Flowsheet Data    Depression screen [facility] 2/9 01/01/2018 02/04/2017 10/09/2016   Decreased Interest 0 0 0   Down, Depressed, Hopeless 0 0 0   PHQ - 2 Score 0 0 0   Altered sleeping 1 - -   Tired, decreased energy 1 - -   Change in appetite 0 - -   Feeling bad or failure about yourself  0 - -   Trouble concentrating 0 - -   Moving slowly or fidgety/restless 0 - -   Suicidal thoughts 0 - -   PHQ-9 Score 2 - -   Difficult doing work/chores Somewhat difficult - -     Interpretation of Total Score  Total Score Depression Severity:  1-4 = Minimal depression, 5-9 = Mild depression, 10-14 = Moderate depression, 15-19 = Moderately severe depression, 20-27 = Severe depression   Psychosocial Evaluation and Intervention: Psychosocial Evaluation - 01/01/18 1054      Psychosocial Evaluation & Interventions   Interventions  Encouraged to exercise with the program and follow exercise prescription    Continue Psychosocial Services   No Follow up required       Psychosocial Re-Evaluation: Psychosocial Re-Evaluation    Row Name 02/11/18 1348 03/05/18 1256 04/02/18 1025         Psychosocial Re-Evaluation   Current issues with  None Identified  None Identified  None Identified     Comments  Patient's initial QOL score was 24.50 and his PHQ-9 score was 2.   Patient's initial QOL score was 24.50 and his PHQ-9 score was 2.   Patient's initial QOL score  was 24.50 and his PHQ-9 score was 2.      Expected  Outcomes  Patient will have no psychosocial issues identified at discharge.   Patient will have no psychosocial issues identified at discharge.   Patient will have no psychosocial issues identified at discharge.      Interventions  Stress management education;Encouraged to attend Pulmonary Rehabilitation for the exercise;Relaxation education  Stress management education;Encouraged to attend Pulmonary Rehabilitation for the exercise;Relaxation education  Stress management education;Encouraged to attend Pulmonary Rehabilitation for the exercise;Relaxation education     Continue Psychosocial Services   No Follow up required  No Follow up required  No Follow up required        Psychosocial Discharge (Final Psychosocial Re-Evaluation): Psychosocial Re-Evaluation - 04/02/18 1025      Psychosocial Re-Evaluation   Current issues with  None Identified    Comments  Patient's initial QOL score was 24.50 and his PHQ-9 score was 2.     Expected Outcomes  Patient will have no psychosocial issues identified at discharge.     Interventions  Stress management education;Encouraged to attend Pulmonary Rehabilitation for the exercise;Relaxation education    Continue Psychosocial Services   No Follow up required        Education: Education Goals: Education classes will be provided on a weekly basis, covering required topics. Participant will state understanding/return demonstration of topics presented.  Learning Barriers/Preferences: Learning Barriers/Preferences - 01/01/18 1000      Learning Barriers/Preferences   Learning Barriers  Hearing    Learning Preferences  Group Instruction;Skilled Demonstration       Education Topics: How Lungs Work and Diseases: - Discuss the anatomy of the lungs and diseases that can affect the lungs, such as COPD.   PULMONARY REHAB OTHER RESPIRATORY from 03/26/2018 in [place]  Date  02/05/18  Educator  DJ  Instruction Review Code  2-  Demonstrated Understanding      Exercise: -Discuss the importance of exercise, FITT principles of exercise, normal and abnormal responses to exercise, and how to exercise safely.   PULMONARY REHAB OTHER RESPIRATORY from 03/26/2018 in [place]  Date  01/29/18  Educator  [place]  Instruction Review Code  2- Demonstrated Understanding      Environmental Irritants: -Discuss types of environmental irritants and how to limit exposure to environmental irritants.   PULMONARY REHAB OTHER RESPIRATORY from 03/26/2018 in [place]  Date  02/12/18  Educator  DC  Instruction Review Code  2- Demonstrated Understanding      Meds/Inhalers and oxygen: - Discuss respiratory medications, definition of an inhaler and oxygen, and the proper way to use an inhaler and oxygen.   PULMONARY REHAB OTHER RESPIRATORY from 03/26/2018 in [place]  Date  02/19/18  Educator  DC      Energy Saving Techniques: - Discuss methods to conserve energy and decrease shortness of breath when performing activities of daily living.    PULMONARY REHAB OTHER RESPIRATORY from 03/26/2018 in [place]  Date  02/26/18  Educator  DC  Instruction Review Code  2- Demonstrated Understanding      Bronchial Hygiene / Breathing Techniques: - Discuss breathing mechanics, pursed-lip breathing technique,  proper posture, effective ways to clear airways, and other functional breathing techniques   PULMONARY REHAB OTHER RESPIRATORY from 03/26/2018 in [place]  Date  03/05/18  Educator  DJ  Instruction Review Code  2- Demonstrated Understanding      Cleaning  Equipment: - Provides group verbal and written instruction about the health risks of elevated stress, cause of high stress, and healthy ways to reduce stress.   PULMONARY REHAB OTHER RESPIRATORY from 03/26/2018 in [place]  Date  03/12/18   Educator  [place]  Instruction Review Code  2- Demonstrated Understanding      Nutrition I: Fats: - Discuss the types of cholesterol, what cholesterol does to the body, and how cholesterol levels can be controlled.   PULMONARY REHAB OTHER RESPIRATORY from 03/26/2018 in [place]  Date  03/19/18  Educator  DJ  Instruction Review Code  2- Demonstrated Understanding      Nutrition II: Labels: -Discuss the different components of food labels and how to read food labels.   PULMONARY REHAB OTHER RESPIRATORY from 03/26/2018 in [place]  Date  03/26/18  Educator  DJ  Instruction Review Code  2- Demonstrated Understanding      Respiratory Infections: - Discuss the signs and symptoms of respiratory infections, ways to prevent respiratory infections, and the importance of seeking medical treatment when having a respiratory infection.   Stress I: Signs and Symptoms: - Discuss the causes of stress, how stress may lead to anxiety and depression, and ways to limit stress.   PULMONARY REHAB OTHER RESPIRATORY from 03/26/2018 in [place]  Date  01/08/18  Educator  Marya Amsler C.  Instruction Review Code  2- Demonstrated Understanding      Stress II: Relaxation: -Discuss relaxation techniques to limit stress.   Oxygen for Home/Travel: - Discuss how to prepare for travel when on oxygen and proper ways to transport and store oxygen to ensure safety.   PULMONARY REHAB OTHER RESPIRATORY from 03/26/2018 in [place]  Date  01/22/18  Educator  DJ  Instruction Review Code  2- Demonstrated Understanding      Knowledge Questionnaire Score: Knowledge Questionnaire Score - 01/01/18 1012      Knowledge Questionnaire Score   Pre Score  14/18       Core Components/Risk Factors/Patient Goals at Admission: Personal Goals and Risk Factors at Admission - 01/01/18 1048      Core Components/Risk Factors/Patient  Goals on Admission    Weight Management  Yes    Intervention  Weight Management/Obesity: Establish reasonable short term and long term weight goals.    Admit Weight  227 lb 9.6 oz (103.2 kg)    Goal Weight: Short Term  217 lb 9.6 oz (98.7 kg)    Goal Weight: Long Term  207 lb 9.6 oz (94.2 kg)    Expected Outcomes  Short Term: Continue to assess and modify interventions until short term weight is achieved;Long Term: Adherence to nutrition and physical activity/exercise program aimed toward attainment of established weight goal    Personal Goal Other  Yes    Personal Goal  Breathe better, lose 45lbs, get back in physical shape    Intervention  Attend program 2 x week and supplement with home exercise 3 x week.     Expected Outcomes  Attain personal goals.        Core Components/Risk Factors/Patient Goals Review:  Goals and Risk Factor Review    Row Name 02/11/18 1344 03/05/18 1252 04/02/18 1023         Core Components/Risk Factors/Patient Goals Review   Personal Goals Review  Weight Management/Obesity;Improve shortness of breath with ADL's Breathe better; be in better shape; lose 45 lbs long term; play golf and visit  again.   Weight Management/Obesity;Improve shortness of breath with ADL's Breathe better; be in better shape; lose 45 lbs long term; Play golf and visit again.  Weight Management/Obesity;Improve shortness of breath with ADL's Breathe better; be in better shape; lose 45 lbs long term; play golf and visit again.      Review  Patient has completed 11 sessions losing 4 lbs. He is doing well in the program with progression. He says he is getting less SOB with activities and says he had noted less hoarsness when he talks. He feels better overall and is stronger. He played golf last week without difficulty and is able to visit some again. Will continue to monitor.   Patient has completed 18 sessions losing 4 lbs. He continues to do well in the program with continued progression. He  continues to say he is getting better with less SOB. He is still hoarse but is breathing better. He has not played golf recently due to osteoarthritis but does feel strong enough to play. Will continue to monitor.  Patient has completed 24 sessions losing 12.1 lbs overall but gaining 15 lbs since his last 30 day review. Patient says he is eating healthy. He is doing well in the program with progression. He continues to have less SOB and feels like he is getting in shape. Will continue to monitor for progress.      Expected Outcomes  Patient will continue to attend sessions and complete the program meeting his personal goals.   Patient will continue to attend sessions and complete the program meeting his personal goals.   Patient will continue to attend sessions and complete the program meeting his personal goals.         Core Components/Risk Factors/Patient Goals at Discharge (Final Review):  Goals and Risk Factor Review - 04/02/18 1023      Core Components/Risk Factors/Patient Goals Review   Personal Goals Review  Weight Management/Obesity;Improve shortness of breath with ADL's Breathe better; be in better shape; lose 45 lbs long term; play golf and visit again.     Review  Patient has completed 24 sessions losing 12.1 lbs overall but gaining 15 lbs since his last 30 day review. Patient says he is eating healthy. He is doing well in the program with progression. He continues to have less SOB and feels like he is getting in shape. Will continue to monitor for progress.     Expected Outcomes  Patient will continue to attend sessions and complete the program meeting his personal goals.        ITP Comments: ITP Comments    Row Name 01/12/18 1254           ITP Comments  Patient new to program completing 3 sessions. Will continue to monitor.           Comments: ITP 30 Day REVIEW Pt is making expected progress toward pulmonary rehab goals after completing 24 sessions. Recommend continued  exercise, life style modification, education, and utilization of breathing techniques to increase stamina and strength and decrease shortness of breath with exertion.

## 2018-04-02 NOTE — Progress Notes (Signed)
Daily Session Note  Patient Details  Name: [name] MRN: [id number] Date of Birth: Jan 04, 1951 Referring Provider:     PULMONARY REHAB OTHER RESP ORIENTATION from 01/01/2018 in [place]  Referring Provider  Dr. Chase Caller      Encounter Date: 04/02/2018  Check In: Session Check In - 04/02/18 1045      Check-In   Location  [facility]    Staff Present  [name], MS, EP, [facility], Exercise Physiologist;Gregory Luther Parody, BS, EP, Exercise Physiologist;Tyrisha Benninger Wynetta Emery, RN, BSN    Supervising physician immediately available to respond to emergencies  See telemetry face sheet for immediately available MD    Medication changes reported      No    Fall or balance concerns reported     No    Warm-up and Cool-down  Performed as group-led instruction    Resistance Training Performed  Yes    VAD Patient?  No      Pain Assessment   Currently in Pain?  No/denies    Pain Score  0-No pain    Multiple Pain Sites  No       Capillary Blood Glucose: No results found for this or any previous visit (from the past 24 hour(s)).    Social History   Tobacco Use  Smoking Status Former Smoker  . Packs/day: 3.00  . Years: 20.00  . Pack years: 60.00  . Types: Cigarettes  . Last attempt to quit: 12/30/1989  . Years since quitting: 28.2  Smokeless Tobacco Never Used    Goals Met:  Proper associated with RPD/PD & O2 Sat Independence with exercise equipment Improved SOB with ADL's Using PLB without cueing & demonstrates good technique Exercise tolerated well No report of cardiac concerns or symptoms Strength training completed today  Goals Unmet:  Not Applicable  Comments: Check out 1145.   Dr. Sinda Du is Medical Director for [facility].

## 2018-04-03 ENCOUNTER — Other Ambulatory Visit: Payer: Self-pay | Admitting: Rheumatology

## 2018-04-07 ENCOUNTER — Encounter (HOSPITAL_COMMUNITY)
Admission: RE | Admit: 2018-04-07 | Discharge: 2018-04-07 | Disposition: A | Discharge status: Home / Self Care | Payer: Medicare Other | Source: Ambulatory Visit | Attending: Internal Medicine | Admitting: Internal Medicine

## 2018-04-07 DIAGNOSIS — Z7901 Long term (current) use of anticoagulants: Secondary | ICD-10-CM | POA: Diagnosis not present

## 2018-04-07 DIAGNOSIS — E876 Hypokalemia: Secondary | ICD-10-CM | POA: Diagnosis not present

## 2018-04-07 DIAGNOSIS — R0600 Dyspnea, unspecified: Secondary | ICD-10-CM | POA: Diagnosis not present

## 2018-04-07 DIAGNOSIS — R0609 Other forms of dyspnea: Principal | ICD-10-CM

## 2018-04-07 NOTE — Progress Notes (Signed)
Daily Session Note  Patient Details  Name: [name] MRN: [id number] Date of Birth: April 13, 1951 Referring Provider:     PULMONARY REHAB OTHER RESP ORIENTATION from 01/01/2018 in [place]  Referring Provider  Dr. Chase Caller      Encounter Date: 04/07/2018  Check In: Session Check In - 04/07/18 1103      Check-In   Location  [facility]    Staff Present  [name], MS, EP, [facility], Exercise Physiologist;Jemya Depierro Luther Parody, BS, EP, Exercise Physiologist    Supervising physician immediately available to respond to emergencies  See telemetry face sheet for immediately available MD    Medication changes reported      No    Fall or balance concerns reported     No    Tobacco Cessation  No Change    Warm-up and Cool-down  Performed as group-led instruction    Resistance Training Performed  Yes    VAD Patient?  No      Pain Assessment   Currently in Pain?  No/denies    Pain Score  0-No pain    Multiple Pain Sites  No       Capillary Blood Glucose: No results found for this or any previous visit (from the past 24 hour(s)).    Social History   Tobacco Use  Smoking Status Former Smoker  . Packs/day: 3.00  . Years: 20.00  . Pack years: 60.00  . Types: Cigarettes  . Last attempt to quit: 12/30/1989  . Years since quitting: 28.2  Smokeless Tobacco Never Used    Goals Met:  Independence with exercise equipment Improved SOB with ADL's Using PLB without cueing & demonstrates good technique Exercise tolerated well No report of cardiac concerns or symptoms Strength training completed today  Goals Unmet:  Not Applicable  Comments: Check out 1145   Dr Sinda Du

## 2018-04-08 DIAGNOSIS — B351 Tinea unguium: Secondary | ICD-10-CM | POA: Diagnosis not present

## 2018-04-08 DIAGNOSIS — M79672 Pain in left foot: Secondary | ICD-10-CM | POA: Diagnosis not present

## 2018-04-08 DIAGNOSIS — M79674 Pain in right toe(s): Secondary | ICD-10-CM | POA: Diagnosis not present

## 2018-04-08 DIAGNOSIS — M79671 Pain in right foot: Secondary | ICD-10-CM | POA: Diagnosis not present

## 2018-04-09 ENCOUNTER — Encounter (HOSPITAL_COMMUNITY)
Admission: RE | Admit: 2018-04-09 | Discharge: 2018-04-09 | Disposition: A | Discharge status: Home / Self Care | Payer: Medicare Other | Source: Ambulatory Visit | Attending: Internal Medicine | Admitting: Internal Medicine

## 2018-04-09 ENCOUNTER — Other Ambulatory Visit: Payer: Self-pay | Admitting: Rheumatology

## 2018-04-09 DIAGNOSIS — R0600 Dyspnea, unspecified: Secondary | ICD-10-CM

## 2018-04-09 DIAGNOSIS — R0609 Other forms of dyspnea: Principal | ICD-10-CM

## 2018-04-09 IMAGING — DX DG CHEST 2V
2 series · 2 of 2 positions shown · non-contrast
Comparison: 01/18/2017

CLINICAL DATA: Fever, cough, vomiting, diarrhea

EXAM:
CHEST  2 VIEW

[chest pa]
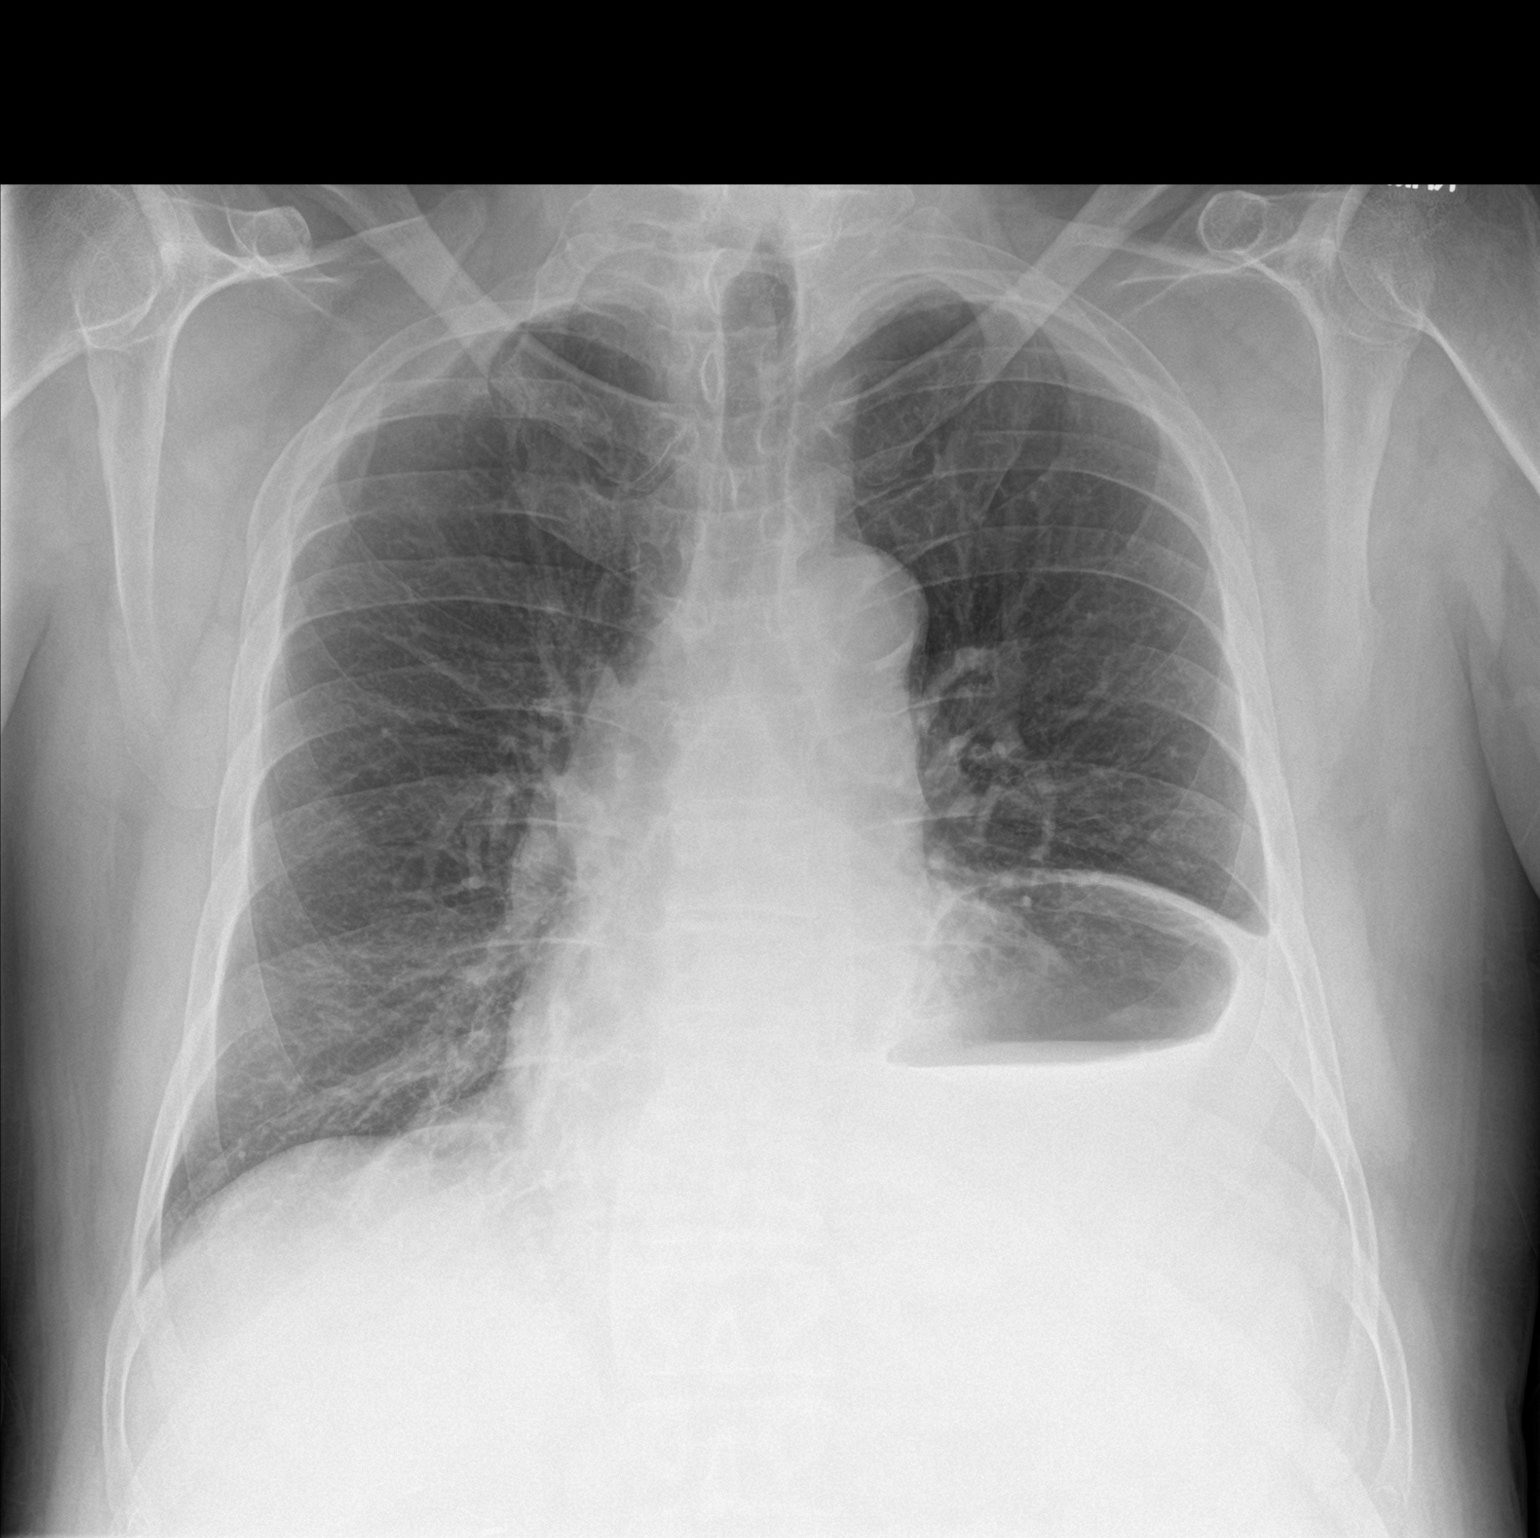

[chest lat]
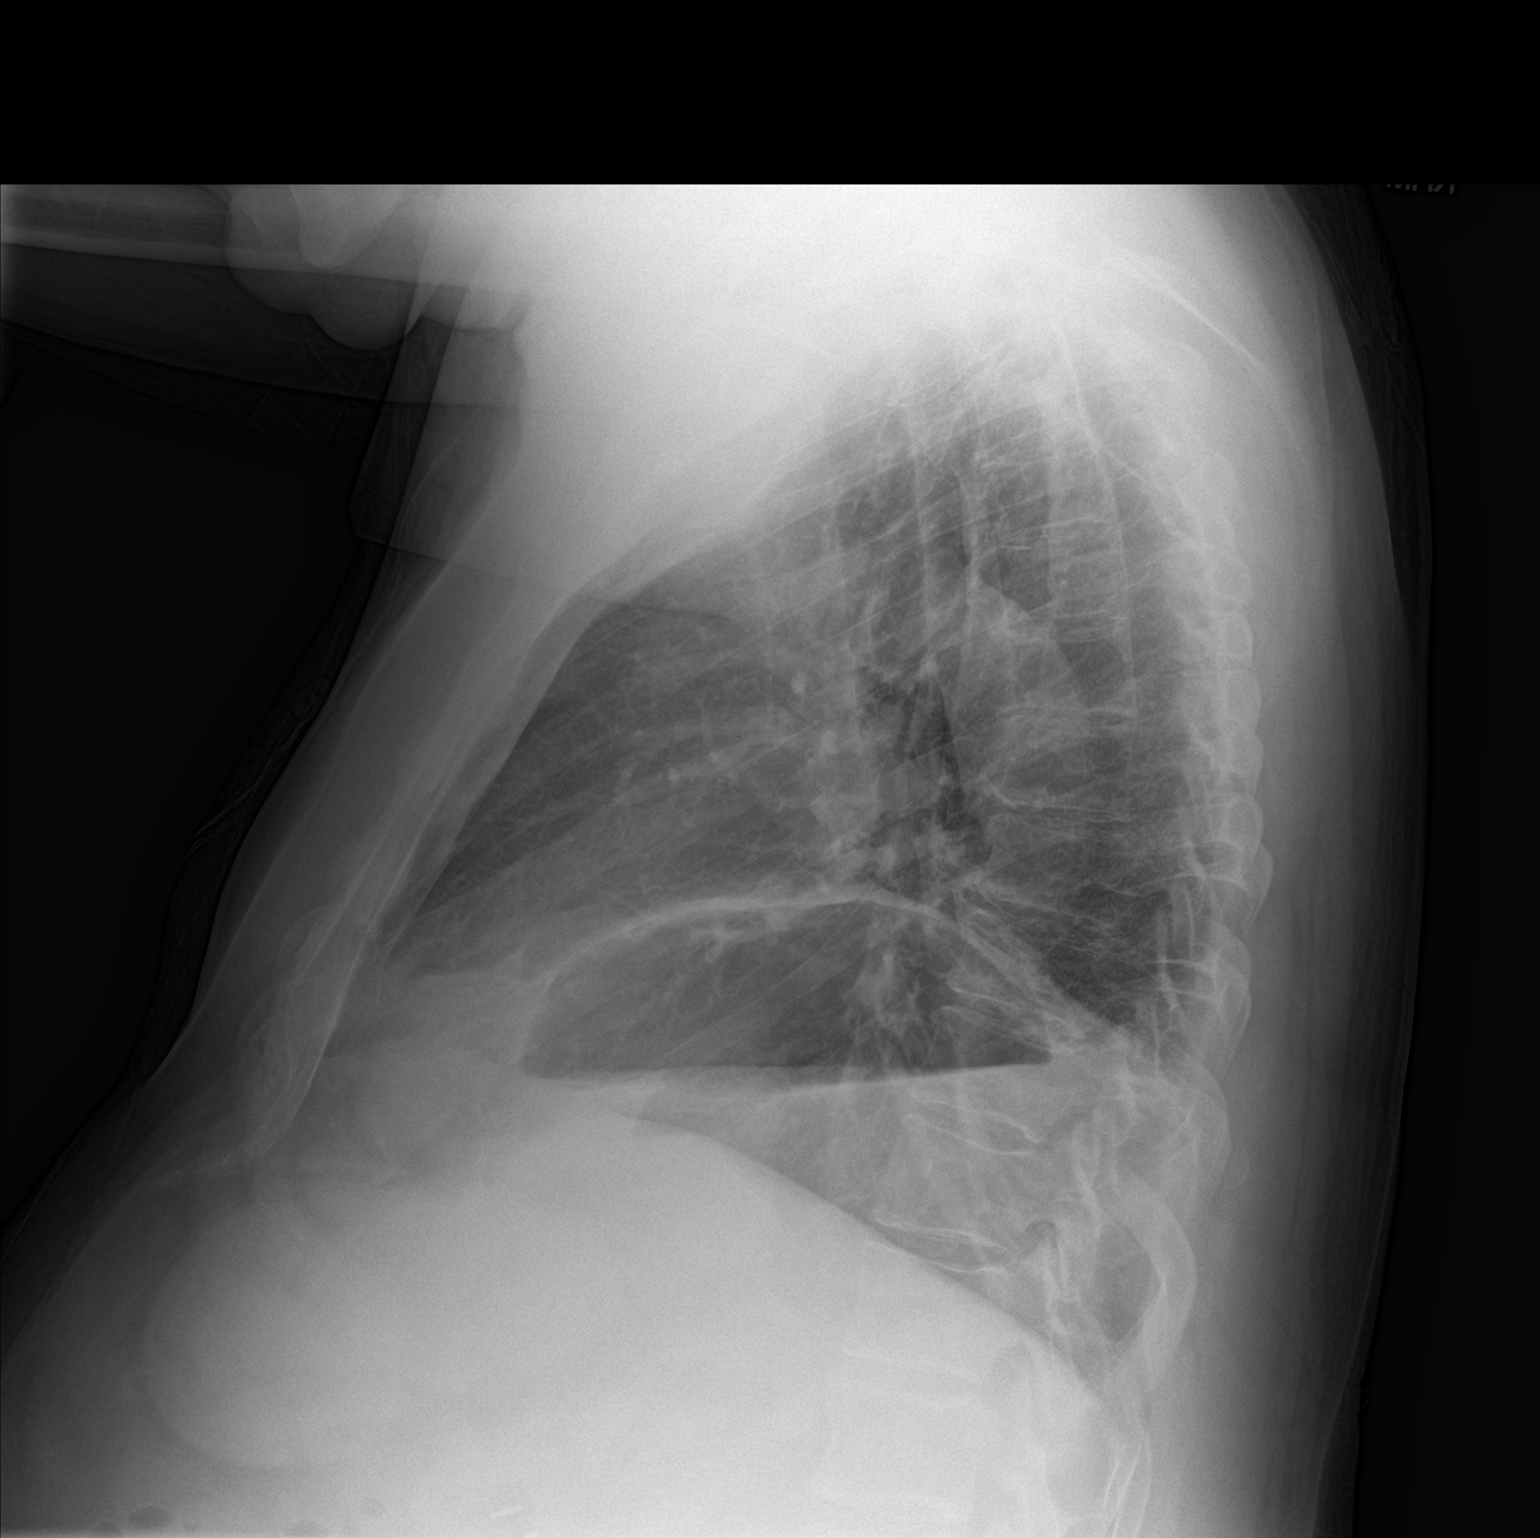

[2 of 2 positions shown; findings below may reference images not displayed]

FINDINGS: There is elevation of the left diaphragm. There is no focal
parenchymal opacity. There is no pleural effusion or pneumothorax.
The heart and mediastinal contours are unremarkable.

The osseous structures are unremarkable.
IMPRESSION: No active cardiopulmonary disease.

## 2018-04-09 NOTE — Progress Notes (Signed)
Daily Session Note  Patient Details  Name: [name] MRN: [id number] Date of Birth: [birth year] Referring Provider:     PULMONARY REHAB OTHER RESP ORIENTATION from 01/01/2018 in [place]  Referring Provider  Dr. Chase Caller      Encounter Date: 04/09/2018  Check In: Session Check In - 04/09/18 1056      Check-In   Location  [facility]    Staff Present  Suzanne Boron, BS, EP, Exercise Physiologist;Debra Wynetta Emery, RN, BSN    Supervising physician immediately available to respond to emergencies  See telemetry face sheet for immediately available MD    Medication changes reported      No    Fall or balance concerns reported     No    Tobacco Cessation  No Change    Warm-up and Cool-down  Performed as group-led instruction    Resistance Training Performed  Yes    VAD Patient?  No      Pain Assessment   Currently in Pain?  No/denies    Pain Score  0-No pain    Multiple Pain Sites  No       Capillary Blood Glucose: No results found for this or any previous visit (from the past 24 hour(s)).    Social History   Tobacco Use  Smoking Status Former Smoker  . Packs/day: 3.00  . Years: 20.00  . Pack years: 60.00  . Types: Cigarettes  . Last attempt to quit: 12/30/1989  . Years since quitting: 28.2  Smokeless Tobacco Never Used    Goals Met:  Independence with exercise equipment Improved SOB with ADL's Using PLB without cueing & demonstrates good technique Exercise tolerated well No report of cardiac concerns or symptoms Strength training completed today  Goals Unmet:  Not Applicable  Comments: Check out 1145   Dr. Sinda Du is Medical Director for [facility].

## 2018-04-14 ENCOUNTER — Encounter (HOSPITAL_COMMUNITY)
Admission: RE | Admit: 2018-04-14 | Discharge: 2018-04-14 | Disposition: A | Discharge status: Home / Self Care | Payer: Medicare Other | Source: Ambulatory Visit | Attending: Internal Medicine | Admitting: Internal Medicine

## 2018-04-14 DIAGNOSIS — R0600 Dyspnea, unspecified: Secondary | ICD-10-CM

## 2018-04-14 DIAGNOSIS — R0609 Other forms of dyspnea: Principal | ICD-10-CM

## 2018-04-14 NOTE — Progress Notes (Signed)
Daily Session Note  Patient Details  Name: [name] MRN: [id number] Date of Birth: 03-19-1951 Referring Provider:     PULMONARY REHAB OTHER RESP ORIENTATION from 01/01/2018 in [place]  Referring Provider  Dr. Chase Caller      Encounter Date: 04/14/2018  Check In: Session Check In - 04/14/18 1059      Check-In   Location  [facility]    Staff Present  Suzanne Boron, BS, EP, Exercise Physiologist;Diane Coad, MS, EP, [facility], Exercise Physiologist    Supervising physician immediately available to respond to emergencies  See telemetry face sheet for immediately available MD    Medication changes reported      No    Fall or balance concerns reported     No    Tobacco Cessation  No Change    Warm-up and Cool-down  Performed as group-led instruction    Resistance Training Performed  Yes    VAD Patient?  No      Pain Assessment   Currently in Pain?  No/denies    Pain Score  0-No pain    Multiple Pain Sites  No       Capillary Blood Glucose: No results found for this or any previous visit (from the past 24 hour(s)).    Social History   Tobacco Use  Smoking Status Former Smoker  . Packs/day: 3.00  . Years: 20.00  . Pack years: 60.00  . Types: Cigarettes  . Last attempt to quit: 12/30/1989  . Years since quitting: 28.3  Smokeless Tobacco Never Used    Goals Met:  Independence with exercise equipment Improved SOB with ADL's Using PLB without cueing & demonstrates good technique Exercise tolerated well No report of cardiac concerns or symptoms Strength training completed today  Goals Unmet:  Not Applicable  Comments: Check out 1145   Dr. Sinda Du is Medical Director for [facility].

## 2018-04-15 ENCOUNTER — Other Ambulatory Visit: Payer: Self-pay | Admitting: *Deleted

## 2018-04-15 NOTE — Progress Notes (Signed)
Infusion orders are current for patient CBC CMP Tylenol Benadryl appointments are up to date and follow up appointment  is scheduled TB gold not due yet.  

## 2018-04-16 ENCOUNTER — Encounter (HOSPITAL_COMMUNITY)
Admission: RE | Admit: 2018-04-16 | Discharge: 2018-04-16 | Disposition: A | Discharge status: Home / Self Care | Payer: Medicare Other | Source: Ambulatory Visit | Attending: Internal Medicine | Admitting: Internal Medicine

## 2018-04-16 DIAGNOSIS — R0600 Dyspnea, unspecified: Secondary | ICD-10-CM

## 2018-04-16 DIAGNOSIS — R0609 Other forms of dyspnea: Principal | ICD-10-CM

## 2018-04-16 NOTE — Progress Notes (Signed)
Daily Session Note  Patient Details  Name: ANDRA MATSUO MRN: [id number] Date of Birth: 1951-04-07 Referring Provider:     PULMONARY REHAB OTHER RESP ORIENTATION from 01/01/2018 in [place]  Referring Provider  Dr. Chase Caller      Encounter Date: 04/16/2018  Check In: Session Check In - 04/16/18 1045      Check-In   Location  [facility]    Staff Present  Aundra Dubin, RN, BSN;Gregory Luther Parody, BS, EP, Exercise Physiologist    Supervising physician immediately available to respond to emergencies  See telemetry face sheet for immediately available MD    Medication changes reported      No    Fall or balance concerns reported     No    Warm-up and Cool-down  Performed as group-led instruction    Resistance Training Performed  Yes    VAD Patient?  No      Pain Assessment   Currently in Pain?  No/denies    Pain Score  0-No pain    Multiple Pain Sites  No       Capillary Blood Glucose: No results found for this or any previous visit (from the past 24 hour(s)).    Social History   Tobacco Use  Smoking Status Former Smoker  . Packs/day: 3.00  . Years: 20.00  . Pack years: 60.00  . Types: Cigarettes  . Last attempt to quit: 12/30/1989  . Years since quitting: 28.3  Smokeless Tobacco Never Used    Goals Met:  Proper associated with RPD/PD & O2 Sat Independence with exercise equipment Improved SOB with ADL's Using PLB without cueing & demonstrates good technique Exercise tolerated well No report of cardiac concerns or symptoms Strength training completed today  Goals Unmet:  Not Applicable  Comments: Check out 1145.   Dr. Sinda Du is Medical Director for [facility].

## 2018-04-20 ENCOUNTER — Ambulatory Visit (HOSPITAL_COMMUNITY)
Admission: RE | Admit: 2018-04-20 | Discharge: 2018-04-20 | Disposition: A | Discharge status: Home / Self Care | Payer: Medicare Other | Source: Ambulatory Visit | Attending: Rheumatology | Admitting: Rheumatology

## 2018-04-20 DIAGNOSIS — M0579 Rheumatoid arthritis with rheumatoid factor of multiple sites without organ or systems involvement: Secondary | ICD-10-CM

## 2018-04-20 DIAGNOSIS — M069 Rheumatoid arthritis, unspecified: Secondary | ICD-10-CM | POA: Diagnosis not present

## 2018-04-20 LAB — COMPREHENSIVE METABOLIC PANEL
ALT: 20 U/L (ref 17–63)
AST: 39 U/L (ref 15–41)
Albumin: 3.7 g/dL (ref 3.5–5.0)
Alkaline Phosphatase: 57 U/L (ref 38–126)
Anion gap: 8 (ref 5–15)
BUN: 9 mg/dL (ref 6–20)
CO2: 29 mmol/L (ref 22–32)
Calcium: 9.2 mg/dL (ref 8.9–10.3)
Chloride: 104 mmol/L (ref 101–111)
Creatinine, Ser: 1.17 mg/dL (ref 0.61–1.24)
GFR calc Af Amer: >60 mL/min (ref >60)
GFR calc non Af Amer: >60 mL/min (ref >60)
Glucose, Bld: 135 mg/dL — ABNORMAL HIGH (ref 65–99)
Potassium: 4.5 mmol/L (ref 3.5–5.1)
Sodium: 141 mmol/L (ref 135–145)
Total Bilirubin: 1.2 mg/dL (ref 0.3–1.2)
Total Protein: 6 g/dL — ABNORMAL LOW (ref 6.5–8.1)

## 2018-04-20 LAB — CBC
HCT: 41.6 % (ref 39.0–52.0)
Hemoglobin: 14 g/dL (ref 13.0–17.0)
MCH: 30.7 pg (ref 26.0–34.0)
MCHC: 33.7 g/dL (ref 30.0–36.0)
MCV: 91.2 fL (ref 78.0–100.0)
Platelets: 174 10*3/uL (ref 150–400)
RBC: 4.56 MIL/uL (ref 4.22–5.81)
RDW: 14.9 % (ref 11.5–15.5)
WBC: 6.5 10*3/uL (ref 4.0–10.5)

## 2018-04-20 MED ORDER — TOCILIZUMAB 400 MG/20ML IV SOLN
400.0000 mg | INTRAVENOUS | Status: DC
  Administered 2018-04-20: 400 mg via INTRAVENOUS
  Filled 2018-04-20: qty 20

## 2018-04-20 MED ORDER — DIPHENHYDRAMINE HCL 25 MG PO CAPS: 25.0000 mg | ORAL_CAPSULE | ORAL | Status: DC

## 2018-04-20 MED ORDER — ACETAMINOPHEN 325 MG PO TABS: 650.0000 mg | ORAL_TABLET | ORAL | Status: DC

## 2018-04-21 ENCOUNTER — Encounter (HOSPITAL_COMMUNITY)
Admission: RE | Admit: 2018-04-21 | Discharge: 2018-04-21 | Disposition: A | Discharge status: Home / Self Care | Payer: Medicare Other | Source: Ambulatory Visit | Attending: Internal Medicine | Admitting: Internal Medicine

## 2018-04-21 DIAGNOSIS — R0600 Dyspnea, unspecified: Secondary | ICD-10-CM | POA: Diagnosis not present

## 2018-04-21 NOTE — Progress Notes (Signed)
Daily Session Note  Patient Details  Name: [name] MRN: [id number] Date of Birth: July 15, 1951 Referring Provider:     PULMONARY REHAB OTHER RESP ORIENTATION from 01/01/2018 in [place]  Referring Provider  Dr. Chase Caller      Encounter Date: 04/21/2018  Check In: Session Check In - 04/21/18 1056      Check-In   Location  [facility]    Staff Present  Russella Dar, MS, EP, [facility], Exercise Physiologist;Gregory Luther Parody, BS, EP, Exercise Physiologist    Supervising physician immediately available to respond to emergencies  See telemetry face sheet for immediately available MD    Medication changes reported      No    Fall or balance concerns reported     No    Tobacco Cessation  -- Quit 1999    Warm-up and Cool-down  Performed as group-led instruction    Resistance Training Performed  Yes    VAD Patient?  No      Pain Assessment   Currently in Pain?  No/denies    Pain Score  0-No pain    Multiple Pain Sites  No       Capillary Blood Glucose: No results found for this or any previous visit (from the past 24 hour(s)).    Social History   Tobacco Use  Smoking Status Former Smoker  . Packs/day: 3.00  . Years: 20.00  . Pack years: 60.00  . Types: Cigarettes  . Last attempt to quit: 12/30/1989  . Years since quitting: 28.3  Smokeless Tobacco Never Used    Goals Met:  Proper associated with RPD/PD & O2 Sat Improved SOB with ADL's Using PLB without cueing & demonstrates good technique Exercise tolerated well No report of cardiac concerns or symptoms Strength training completed today  Goals Unmet:  Not Applicable  Comments: Check out: 1145   Dr. Sinda Du is Medical Director for [facility] Pulmonary [facility].

## 2018-04-23 ENCOUNTER — Encounter (HOSPITAL_COMMUNITY)
Admission: RE | Admit: 2018-04-23 | Discharge: 2018-04-23 | Disposition: A | Discharge status: Home / Self Care | Payer: Medicare Other | Source: Ambulatory Visit | Attending: Internal Medicine | Admitting: Internal Medicine

## 2018-04-23 DIAGNOSIS — R0600 Dyspnea, unspecified: Secondary | ICD-10-CM | POA: Diagnosis not present

## 2018-04-23 DIAGNOSIS — R0609 Other forms of dyspnea: Principal | ICD-10-CM

## 2018-04-23 NOTE — Progress Notes (Signed)
Daily Session Note  Patient Details  Name: [name] MRN: [id number] Date of Birth: 1951-08-14 Referring Provider:     PULMONARY REHAB OTHER RESP ORIENTATION from 01/01/2018 in [place]  Referring Provider  Dr. Chase Caller      Encounter Date: 04/23/2018  Check In: Session Check In - 04/23/18 1045      Check-In   Location  [facility]    Staff Present  [name], MS, EP, [facility], Exercise Physiologist;Gregory Luther Parody, BS, EP, Exercise Physiologist;Zahlia Deshazer Wynetta Emery, RN, BSN    Supervising physician immediately available to respond to emergencies  See telemetry face sheet for immediately available MD    Medication changes reported      No    Fall or balance concerns reported     No    Warm-up and Cool-down  Performed as group-led instruction    Resistance Training Performed  Yes    VAD Patient?  No      Pain Assessment   Currently in Pain?  No/denies    Pain Score  0-No pain    Multiple Pain Sites  No       Capillary Blood Glucose: No results found for this or any previous visit (from the past 24 hour(s)).  Exercise Prescription Changes - 04/22/18 1400      Response to Exercise   Blood Pressure (Admit)  136/72    Blood Pressure (Exercise)  158/70    Blood Pressure (Exit)  136/72    Heart Rate (Admit)  64 bpm    Heart Rate (Exercise)  96 bpm    Heart Rate (Exit)  92 bpm    Oxygen Saturation (Admit)  95 %    Oxygen Saturation (Exercise)  94 %    Oxygen Saturation (Exit)  93 %    Rating of Perceived Exertion (Exercise)  11    Perceived Dyspnea (Exercise)  11    Duration  Progress to 30 minutes of  aerobic without signs/symptoms of physical distress    Intensity  THRR New [id number]      Progression   Progression  Continue to progress workloads to maintain intensity without signs/symptoms of physical distress.      Resistance Training   Training Prescription  Yes    Weight  5    Reps  10-15      Treadmill   MPH  2.9    Grade  0    Minutes  15    METs  2.3      NuStep   Level  3    SPM  124    Minutes  20    METs  5      Home Exercise Plan   Plans to continue exercise at  Home (comment)    Frequency  Add 2 additional days to program exercise sessions.    Initial Home Exercises Provided  01/08/17       Social History   Tobacco Use  Smoking Status Former Smoker  . Packs/day: 3.00  . Years: 20.00  . Pack years: 60.00  . Types: Cigarettes  . Last attempt to quit: 12/30/1989  . Years since quitting: 28.3  Smokeless Tobacco Never Used    Goals Met:  Proper associated with RPD/PD & O2 Sat Independence with exercise equipment Improved SOB with ADL's Using PLB without cueing & demonstrates good technique Exercise tolerated well No report of cardiac concerns or symptoms Strength training completed today  Goals Unmet:  Not Applicable  Comments:  Check out 1145.   Dr. Sinda Du is Medical Director for [facility] Pul[facility] Re[facility].

## 2018-04-27 ENCOUNTER — Telehealth: Payer: Self-pay | Admitting: *Deleted

## 2018-04-27 NOTE — Telephone Encounter (Signed)
Future patient left a message stating that he missed our call. Asking for a call back.  Reviewed chart and this appears to be a referral that is ready for initial scheduling

## 2018-04-28 ENCOUNTER — Encounter (HOSPITAL_COMMUNITY)
Admission: RE | Admit: 2018-04-28 | Discharge: 2018-04-28 | Disposition: A | Discharge status: Home / Self Care | Payer: Medicare Other | Source: Ambulatory Visit | Attending: Internal Medicine | Admitting: Internal Medicine

## 2018-04-28 DIAGNOSIS — R0609 Other forms of dyspnea: Principal | ICD-10-CM

## 2018-04-28 DIAGNOSIS — R0600 Dyspnea, unspecified: Secondary | ICD-10-CM | POA: Diagnosis not present

## 2018-04-28 NOTE — Progress Notes (Signed)
Daily Session Note  Patient Details  Name: [name] MRN: [id number] Date of Birth: 1951/09/04 Referring Provider:     PULMONARY REHAB OTHER RESP ORIENTATION from 01/01/2018 in [place]  Referring Provider  Dr. Chase Caller      Encounter Date: 04/28/2018  Check In: Session Check In - 04/28/18 1052      Check-In   Location  [facility]    Staff Present  [name], MS, EP, [facility], Exercise Physiologist;Kaylin Schellenberg Luther Parody, BS, EP, Exercise Physiologist;Debra Wynetta Emery, RN, BSN    Supervising physician immediately available to respond to emergencies  See telemetry face sheet for immediately available MD    Medication changes reported      No    Fall or balance concerns reported     No    Warm-up and Cool-down  Performed as group-led instruction    Resistance Training Performed  Yes    VAD Patient?  No      Pain Assessment   Currently in Pain?  No/denies    Pain Score  0-No pain    Multiple Pain Sites  No       Capillary Blood Glucose: No results found for this or any previous visit (from the past 24 hour(s)).    Social History   Tobacco Use  Smoking Status Former Smoker  . Packs/day: 3.00  . Years: 20.00  . Pack years: 60.00  . Types: Cigarettes  . Last attempt to quit: 12/30/1989  . Years since quitting: 28.3  Smokeless Tobacco Never Used    Goals Met:  Independence with exercise equipment Improved SOB with ADL's Using PLB without cueing & demonstrates good technique Exercise tolerated well Queuing for purse lip breathing No report of cardiac concerns or symptoms Strength training completed today  Goals Unmet:  Not Applicable  Comments: Check out 1145   Dr. Sinda Du is Medical Director for [facility] Pulmonary Rehab.

## 2018-04-29 NOTE — Progress Notes (Signed)
Pulmonary Individual Treatment Plan  Patient Details  Name: [name] MRN: [id number] Date of Birth: 08/06/1951 Referring Provider:     PULMONARY REHAB OTHER RESP ORIENTATION from 01/01/2018 in [place]  Referring Provider  Dr. Chase Caller      Initial Encounter Date:    [place] from 01/01/2018 in [place]  Date  01/01/18  Referring Provider  Dr. Chase Caller      Visit Diagnosis: Dyspnea on exertion  Patient's Home Medications on Admission:   Current Outpatient Medications:  .  acetaminophen (TYLENOL) 500 MG tablet, Take 1,000 mg by mouth every 6 (six) hours as needed (arthritis pain)., Disp: , Rfl:  .  albuterol (PROVENTIL) (2.5 MG/3ML) 0.083% nebulizer solution, Take 3 mLs (2.5 mg total) by nebulization every 6 (six) hours as needed for wheezing or shortness of breath., Disp: 75 mL, Rfl: 12 .  albuterol (PROVENTIL) 2 MG tablet, Take 2 mg by mouth 2 (two) times daily., Disp: , Rfl:  .  alendronate (FOSAMAX) 70 MG tablet, TAKE 1 TABLET BY MOUTH ONCE A WEEK. TAKE WITH FULL GLASS OF WATER ON EMPTY STOMACH, Disp: 12 tablet, Rfl: 0 .  allopurinol (ZYLOPRIM) 100 MG tablet, TAKE 1 TABLET BY MOUTH TWICE A DAY, Disp: 180 tablet, Rfl: 0 .  Calcium-Phosphorus-Vitamin D (CITRACAL +D3 PO), Take 2 tablets by mouth 2 (two) times daily. , Disp: , Rfl:  .  dicyclomine (BENTYL) 10 MG capsule, TAKE 1 CAPSULE BY MOUTH 4 TIMES DAILY BEFORE MEALS AND AT BEDTIME (Patient taking differently: TAKE 2 CAPSULE BY MOUTH TWO TIMES DAILY), Disp: 120 capsule, Rfl: 0 .  fluticasone furoate-vilanterol (BREO ELLIPTA) 100-25 MCG/INH AEPB, Inhale 1 puff into the lungs daily. (Patient not taking: Reported on 03/18/2018), Disp: 1 each, Rfl: 4 .  folic acid (FOLVITE) 1 MG tablet, TAKE 2 TABLETS BY MOUTH EVERY MORNING, Disp: 180 tablet, Rfl: 4 .  furosemide (LASIX) 40 MG tablet, TAKE 1 TABLET BY MOUTH TWO TIMES DAILY (Patient taking differently:  TAKE 2 TABLETS BY MOUTH (Evenings)), Disp: 60 tablet, Rfl: 8 .  INCRUSE ELLIPTA 62.5 MCG/INH AEPB, INHALE 1 PUFF INTO LUNGS DAILY, Disp: 30 each, Rfl: 3 .  leflunomide (ARAVA) 20 MG tablet, TAKE 1 TABLET BY MOUTH EVERY DAY, Disp: 30 tablet, Rfl: 2 .  omeprazole (PRILOSEC) 40 MG capsule, Take 40 mg by mouth 2 (two) times daily. , Disp: , Rfl: 2 .  Oxymetazoline HCl (NASAL SPRAY NA), Place 2 sprays into the nose daily as needed (allergies)., Disp: , Rfl:  .  phenytoin (DILANTIN) 100 MG ER capsule, Take 100-200 mg by mouth 2 (two) times daily. Take one capsule in the morning and 2 capsules at bedtime, Disp: , Rfl:  .  PROAIR HFA 108 (90 Base) MCG/ACT inhaler, Inhale 1-2 puffs into the lungs every 6 (six) hours as needed. , Disp: , Rfl:  .  Tocilizumab (ACTEMRA IV), Inject into the vein every 28 (twenty-eight) days. For Rheumatoid Arthritis, Disp: , Rfl:  .  warfarin (COUMADIN) 3 MG tablet, Take 3 mg by mouth at bedtime. , Disp: , Rfl:   Past Medical History: Past Medical History:  Diagnosis Date  . Anxiety    hx of   . Arthritis   . Asthma   . Clotting disorder (HCC)    DVT both legs   . COPD (chronic obstructive pulmonary disease) ([place])   . DDD (degenerative disc disease), cervical    with UE's paresthesias  . Diverticulosis   .  DVT, lower extremity ([place])    bilat  . 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 GERD (gastroesophageal reflux disease)   . Gout   . Heart murmur   . History of measles   . History of shingles   . Hypercholesterolemia   . IBS (irritable bowel syndrome)   . IBS (irritable bowel syndrome)   . Peripheral edema   . Pneumonia    hx of   . Prostate cancer ([place])   . Seizures ([place])    last seizure 20 years ago   . Shortness of breath dyspnea    exertion   . Sleep apnea    not on cpap  . Tubular adenoma of colon 04/2008    Tobacco Use: Social History   Tobacco Use  Smoking Status Former Smoker   . Packs/day: 3.00  . Years: 20.00  . Pack years: 60.00  . Types: Cigarettes  . Last attempt to quit: 12/30/1989  . Years since quitting: 28.3  Smokeless Tobacco Never Used    Labs: Recent Review Scientist, physiological    Labs for ITP Cardiac and Pulmonary Rehab Latest Ref Rng & Units 07/01/2010   TCO2 0 - 100 mmol/L 27      Capillary Blood Glucose: Lab Results  Component Value Date   GLUCAP 101 (H) 11/28/2008     Pulmonary Assessment Scores: Pulmonary Assessment Scores    Row Name 01/01/18 1017         ADL UCSD   ADL Phase  Entry     SOB Score total  18     Rest  0     Walk  2     Stairs  2     Bath  1     Dress  1     Shop  1       CAT Score   CAT Score  10       mMRC Score   mMRC Score  2        Pulmonary Function Assessment: Pulmonary Function Assessment - 01/01/18 1013      Pulmonary Function Tests   FVC%  49 %    FEV1%  54 %    FEV1/FVC Ratio  110    DLCO%  63 %      Initial Spirometry Results   FVC%  49 %    FEV1%  54 %    FEV1/FVC Ratio  110      Post Bronchodilator Spirometry Results   FVC%  48 %    FEV1%  54 %    FEV1/FVC Ratio  113      Breath   Bilateral Breath Sounds  Clear    Shortness of Breath  No       Exercise Target Goals:    Exercise Program Goal: Individual exercise prescription set using results from initial 6 min walk test and THRR while considering  patient's activity barriers and safety.    Exercise Prescription Goal: Initial exercise prescription builds to 30-45 minutes a day of aerobic activity, 2-3 days per week.  Home exercise guidelines will be given to patient during program as part of exercise prescription that the participant will acknowledge.  Activity Barriers & Risk Stratification: Activity Barriers & Cardiac Risk Stratification - 01/01/18 0936      Activity Barriers & Cardiac Risk Stratification   Activity Barriers  Back Problems;Deconditioning    Cardiac Risk Stratification  Low       6 Minute  Walk: 6 Minute Walk    Row Name 01/01/18 0932         6 Minute Walk   Phase  Initial     Distance  1200 feet     Distance % Change  0 %     Distance Feet Change  0 ft     Walk Time  6 minutes     # of Rest Breaks  0     MPH  2.27     METS  2.74     RPE  9     Perceived Dyspnea   9     VO2 Peak  9.54     Symptoms  No     Resting HR  84 bpm     Resting BP  136/67     Resting Oxygen Saturation   94 %     Exercise Oxygen Saturation  during 6 min walk  91 %     Max Ex. HR  105 bpm     Max Ex. BP  146/74     2 Minute Post BP  136/66        Oxygen Initial Assessment: Oxygen Initial Assessment - 01/01/18 1023      Home Oxygen   Home Oxygen Device  None    Sleep Oxygen Prescription  None    Home Exercise Oxygen Prescription  None    Home at Rest Exercise Oxygen Prescription  None      Initial 6 min Walk   Oxygen Used  None      Program Oxygen Prescription   Program Oxygen Prescription  None      Intervention   Short Term Goals  To learn and understand importance of monitoring SPO2 with pulse oximeter and demonstrate accurate use of the pulse oximeter.;To learn and understand importance of maintaining oxygen saturations>88%;To learn and demonstrate proper pursed lip breathing techniques or other breathing techniques.    Long  Term Goals  Verbalizes importance of monitoring SPO2 with pulse oximeter and return demonstration;Maintenance of O2 saturations>88%;Exhibits proper breathing techniques, such as pursed lip breathing or other method taught during program session       Oxygen Re-Evaluation: Oxygen Re-Evaluation    Row Name 02/11/18 1342 03/05/18 1252 04/02/18 1021 04/29/18 1438       Program Oxygen Prescription   Program Oxygen Prescription  None  None  None  None      Home Oxygen   Home Oxygen Device  None  None  None  None    Sleep Oxygen Prescription  None  None  None  None    Home Exercise Oxygen Prescription  None  None  None  None    Home at Rest Exercise  Oxygen Prescription  None  None  None  None    Compliance with Home Oxygen Use  -  Yes  -  Yes      Goals/Expected Outcomes   Short Term Goals  To learn and understand importance of monitoring SPO2 with pulse oximeter and demonstrate accurate use of the pulse oximeter.;To learn and understand importance of maintaining oxygen saturations>88%;To learn and demonstrate proper pursed lip breathing techniques or other breathing techniques.  To learn and understand importance of monitoring SPO2 with pulse oximeter and demonstrate accurate use of the pulse oximeter.;To learn and understand importance of maintaining oxygen saturations>88%;To learn and demonstrate proper pursed lip breathing techniques or other breathing techniques.  To learn  and understand importance of monitoring SPO2 with pulse oximeter and demonstrate accurate use of the pulse oximeter.;To learn and understand importance of maintaining oxygen saturations>88%;To learn and demonstrate proper pursed lip breathing techniques or other breathing techniques.  To learn and understand importance of monitoring SPO2 with pulse oximeter and demonstrate accurate use of the pulse oximeter.;To learn and understand importance of maintaining oxygen saturations>88%;To learn and demonstrate proper pursed lip breathing techniques or other breathing techniques.    Long  Term Goals  Verbalizes importance of monitoring SPO2 with pulse oximeter and return demonstration;Maintenance of O2 saturations>88%;Exhibits proper breathing techniques, such as pursed lip breathing or other method taught during program session  Verbalizes importance of monitoring SPO2 with pulse oximeter and return demonstration;Maintenance of O2 saturations>88%;Exhibits proper breathing techniques, such as pursed lip breathing or other method taught during program session  Verbalizes importance of monitoring SPO2 with pulse oximeter and return demonstration;Maintenance of O2 saturations>88%;Exhibits  proper breathing techniques, such as pursed lip breathing or other method taught during program session  Verbalizes importance of monitoring SPO2 with pulse oximeter and return demonstration;Maintenance of O2 saturations>88%;Exhibits proper breathing techniques, such as pursed lip breathing or other method taught during program session    Comments  Patient is meeting both short and long term goals.   Patient is meeting both short and long term goals.   Patient is meeting both short and long term goals.   Patient is meeting both short and long term goals.     Goals/Expected Outcomes  Patient will continue to meet both his short and long term goals.   Patient will continue to meet both his short and long term goals.   Patient will continue to meet both his short and long term goals.   Patient will continue to meet both his short and long term goals.        Oxygen Discharge (Final Oxygen Re-Evaluation): Oxygen Re-Evaluation - 04/29/18 1438      Program Oxygen Prescription   Program Oxygen Prescription  None      Home Oxygen   Home Oxygen Device  None    Sleep Oxygen Prescription  None    Home Exercise Oxygen Prescription  None    Home at Rest Exercise Oxygen Prescription  None    Compliance with Home Oxygen Use  Yes      Goals/Expected Outcomes   Short Term Goals  To learn and understand importance of monitoring SPO2 with pulse oximeter and demonstrate accurate use of the pulse oximeter.;To learn and understand importance of maintaining oxygen saturations>88%;To learn and demonstrate proper pursed lip breathing techniques or other breathing techniques.    Long  Term Goals  Verbalizes importance of monitoring SPO2 with pulse oximeter and return demonstration;Maintenance of O2 saturations>88%;Exhibits proper breathing techniques, such as pursed lip breathing or other method taught during program session    Comments  Patient is meeting both short and long term goals.     Goals/Expected Outcomes   Patient will continue to meet both his short and long term goals.        Initial Exercise Prescription: Initial Exercise Prescription - 01/01/18 0900      Date of Initial Exercise RX and Referring Provider   Date  01/01/18    Referring Provider  Dr. Chase Caller      Treadmill   MPH  2    Grade  0    Minutes  15    METs  2.5      NuStep   Level  2    SPM  97    Minutes  20    METs  1.9      Prescription Details   Frequency (times per week)  2    Duration  Progress to 30 minutes of continuous aerobic without signs/symptoms of physical distress      Intensity   THRR 40-80% of Max Heartrate  [id number]    Ratings of Perceived Exertion  11-13    Perceived Dyspnea  0-4      Progression   Progression  Continue progressive overload as per policy without signs/symptoms or physical distress.      Resistance Training   Training Prescription  Yes    Weight  1    Reps  10-15       Perform Capillary Blood Glucose checks as needed.  Exercise Prescription Changes:  Exercise Prescription Changes    Row Name 01/08/18 1200 01/09/18 1400 01/27/18 1500 02/10/18 0700 03/03/18 0700     Response to Exercise   Blood Pressure (Admit)  -  144/76  132/64  130/68  128/68   Blood Pressure (Exercise)  -  140/80  150/82  144/78  168/72   Blood Pressure (Exit)  -  138/70  132/76  130/70  134/60   Heart Rate (Admit)  -  80 bpm  77 bpm  68 bpm  70 bpm   Heart Rate (Exercise)  -  98 bpm  102 bpm  93 bpm  96 bpm   Heart Rate (Exit)  -  105 bpm  94 bpm  85 bpm  95 bpm   Oxygen Saturation (Admit)  -  93 %  93 %  93 %  92 %   Oxygen Saturation (Exercise)  -  92 %  91 %  92 %  93 %   Oxygen Saturation (Exit)  -  92 %  92 %  94 %  94 %   Rating of Perceived Exertion (Exercise)  -  11  11  11  11    Perceived Dyspnea (Exercise)  -  11  11  11  11    Duration  -  Progress to 30 minutes of  aerobic without signs/symptoms of physical distress  Progress to 30 minutes of  aerobic without signs/symptoms of  physical distress  Progress to 30 minutes of  aerobic without signs/symptoms of physical distress  Progress to 30 minutes of  aerobic without signs/symptoms of physical distress   Intensity  -  THRR New [id number]  THRR New 107-123-138  THRR New 102-119-136  THRR New 103-102-136     Progression   Progression  -  Continue to progress workloads to maintain intensity without signs/symptoms of physical distress.  Continue to progress workloads to maintain intensity without signs/symptoms of physical distress.  Continue to progress workloads to maintain intensity without signs/symptoms of physical distress.  Continue to progress workloads to maintain intensity without signs/symptoms of physical distress.     Resistance Training   Training Prescription  Yes  Yes  Yes  Yes  Yes   Weight  1  1  1  2  2    Reps  10-15  10-15  10-15  10-15  10-15     Treadmill   MPH  2  2  2.4  2.4  2.6   Grade  0  0  0  0  0   Minutes  15  15  15  15  15    METs  2.5  2.5  2.8  2.8  2.9     NuStep   Level  2  2  2  3  3    SPM  97  111  112  116  117   Minutes  20  20  20  20  20    METs  1.9  2.7  3.1  3.3  3.7     Home Exercise Plan   Plans to continue exercise at  Home (comment)  Home (comment)  Home (comment)  Home (comment)  Home (comment)   Frequency  Add 2 additional days to program exercise sessions.  Add 2 additional days to program exercise sessions.  Add 2 additional days to program exercise sessions.  Add 2 additional days to program exercise sessions.  Add 2 additional days to program exercise sessions.   Initial Home Exercises Provided  01/08/17  01/08/17  01/08/17  01/08/17  01/08/17   Row Name 03/19/18 0700 03/27/18 1400 04/22/18 1400         Response to Exercise   Blood Pressure (Admit)  140/80  124/60  136/72     Blood Pressure (Exercise)  140/66  140/66  158/70     Blood Pressure (Exit)  136/70  120/62  136/72     Heart Rate (Admit)  66 bpm  77 bpm  64 bpm     Heart Rate (Exercise)  97 bpm   103 bpm  96 bpm     Heart Rate (Exit)  88 bpm  94 bpm  92 bpm     Oxygen Saturation (Admit)  96 %  92 %  95 %     Oxygen Saturation (Exercise)  90 %  93 %  94 %     Oxygen Saturation (Exit)  94 %  93 %  93 %     Rating of Perceived Exertion (Exercise)  11  9  11      Perceived Dyspnea (Exercise)  11  9  11      Duration  Progress to 30 minutes of  aerobic without signs/symptoms of physical distress  Progress to 30 minutes of  aerobic without signs/symptoms of physical distress  Progress to 30 minutes of  aerobic without signs/symptoms of physical distress     Intensity  THRR New [id number]  THRR New 107-123-138  THRR New 100-117-135       Progression   Progression  Continue to progress workloads to maintain intensity without signs/symptoms of physical distress.  Continue to progress workloads to maintain intensity without signs/symptoms of physical distress.  Continue to progress workloads to maintain intensity without signs/symptoms of physical distress.       Resistance Training   Training Prescription  Yes  Yes  Yes     Weight  2  2  5      Reps  10-15  10-15  10-15       Treadmill   MPH  2.6  2.9  2.9     Grade  0  0  0     Minutes  15  15  15      METs  2.9  2.3  2.3       NuStep   Level  3  3  3      SPM  106  111  124     Minutes  20  20  20      METs  3.3  3.5  5       Home Exercise Plan   Plans to continue  exercise at  Home (comment)  Home (comment)  Home (comment)     Frequency  Add 2 additional days to program exercise sessions.  Add 2 additional days to program exercise sessions.  Add 2 additional days to program exercise sessions.     Initial Home Exercises Provided  01/08/17  01/08/17  01/08/17        Exercise Comments:  Exercise Comments    Row Name 01/08/18 1257 01/09/18 1431 01/27/18 1519 02/10/18 0747 03/03/18 0759   Exercise Comments  Patient received the take home exercise plan today. THR was addressed as were safety guidelines for being active around the  house. Patient addressed an understanding and was encouraged to ask any future questions as they arise.   Patient is doing well in PR and has increased his SPMs on the Nustep. He has jsut started the program but will be progressed more in time. We will help him reach his goals of breathing better and getting back into better shape.   Patient is doing well in CR. He has progressed on the treadmill with a higher speed. He has also maintained his high SPMs on the Nustep with his level   Patient continues to do well in PR. He has increased his level on the Nustep to level three and has maintained his speed on the treadmill as well. Patient states that he feels stronger throughout the program and has stated he can do more when not in the program.   Patient is doing well in PR and has increased his speed on the treadmill to 2.6. Patient has maintained his level on the nustep machine and has kept his watts and SPMs the same levels. Patient states that he feels stronger from the program and more stamina as well so that he is able to do more yard work around the house.    [place] Name 03/19/18 5956 03/27/18 1408 04/22/18 1416       Exercise Comments  Patient is doing well in PR and has maintained his levels on both of his machines and has stated that he feels stronger and has gained more endurance from the program and it helps him with his work around the house.   Patient continues to do do well in PR and has increased his speed on the treadmill to 2.9. 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   Patient is doing very well in PR and has maintained his level on the treadmill as well as his level on the nustep machine. Patient has increased his SPMs on the nustep machine and has stated to me that he is staying active while not in PR at least 2 other days. Patient works out at a [employer] with family members.          Exercise Goals and Review:  Exercise Goals    Row Name 01/01/18 0939             Exercise Goals   Increase Physical Activity  Yes       Intervention  Provide advice, education, support and counseling about physical activity/exercise needs.;Develop an individualized exercise prescription for aerobic and resistive training based on initial evaluation findings, risk stratification, comorbidities and participant's personal goals.       Expected Outcomes  Achievement of increased cardiorespiratory fitness and enhanced flexibility, muscular endurance and strength shown through measurements of functional capacity  and personal statement of participant.       Increase Strength and Stamina  Yes       Intervention  Provide advice, education, support and counseling about physical activity/exercise needs.;Develop an individualized exercise prescription for aerobic and resistive training based on initial evaluation findings, risk stratification, comorbidities and participant's personal goals.       Expected Outcomes  Achievement of increased cardiorespiratory fitness and enhanced flexibility, muscular endurance and strength shown through measurements of functional capacity and personal statement of participant.       Able to understand and use rate of perceived exertion (RPE) scale  Yes       Intervention  Provide education and explanation on how to use RPE scale       Expected Outcomes  Short Term: Able to use RPE daily in rehab to express subjective intensity level;Long Term:  Able to use RPE to guide intensity level when exercising independently       Able to understand and use Dyspnea scale  Yes       Intervention  Provide education and explanation on how to use Dyspnea scale       Expected Outcomes  Short Term: Able to use Dyspnea scale daily in rehab to express subjective sense of shortness of breath during exertion;Long Term: Able to use Dyspnea scale to guide intensity level when exercising  independently       Knowledge and understanding of Target Heart Rate Range (THRR)  Yes       Intervention  Provide education and explanation of THRR including how the numbers were predicted and where they are located for reference       Expected Outcomes  Short Term: Able to state/look up THRR;Long Term: Able to use THRR to govern intensity when exercising independently;Short Term: Able to use daily as guideline for intensity in rehab       Able to check pulse independently  Yes       Intervention  Provide education and demonstration on how to check pulse in carotid and radial arteries.;Review the importance of being able to check your own pulse for safety during independent exercise       Expected Outcomes  Short Term: Able to explain why pulse checking is important during independent exercise;Long Term: Able to check pulse independently and accurately       Understanding of Exercise Prescription  Yes       Intervention  Provide education, explanation, and written materials on patient's individual exercise prescription       Expected Outcomes  Short Term: Able to explain program exercise prescription;Long Term: Able to explain home exercise prescription to exercise independently          Exercise Goals Re-Evaluation : Exercise Goals Re-Evaluation    Row Name 01/09/18 1429 02/10/18 0745 03/03/18 0757 03/27/18 1407 04/27/18 1512     Exercise Goal Re-Evaluation   Exercise Goals Review  Increase Physical Activity;Increase Strength and Stamina;Knowledge and understanding of Target Heart Rate Range (THRR)  Increase Physical Activity;Increase Strength and Stamina;Knowledge and understanding of Target Heart Rate Range (THRR)  Increase Physical Activity;Increase Strength and Stamina;Knowledge and understanding of Target Heart Rate Range (THRR)  Increase Physical Activity;Increase Strength and Stamina;Knowledge and understanding of Target Heart Rate Range (THRR)  Increase Physical Activity;Increase Strength  and Stamina;Knowledge and understanding of Target Heart Rate Range (THRR)   Comments  Patient is doing well in PR and has increased his SPMs on the Nustep. He has jsut started the program  but will be progressed more in time. We will help him reach his goals of breathing better and getting back into better shape.   Patient continues to do well in PR. He has increased his level on the Nustep to level three and has maintained his speed on the treadmill as well. Patient states that he feels stronger throughout the program and has stated he can do more when not in the program.   Patient is doing well in PR and has increased his speed on the treadmill to 2.6. Patient has maintained his level on the nustep machine and has kept his watts and SPMs the same levels. Patient states that he feels stronger from the program and more stamina as well so that he is able to do more yard work around the house.   Patient continues to do do well in PR and has increased his speed on the treadmill to 2.9. 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   Patient continues to do do well in PR and has increased his speed on the treadmill to 2.9. 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 Patinet has also increased his SPMs on the nustep machine as well. 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     Expected Outcomes  Patient wishes to breathe better and to get back into better shape  Patient wishes to be able to breathe better and to get back into better shape.   Patient wishes to be able to breathe better and to get back into better shape.   Patient wishes to be able to breathe better and to get back into better shape.   Patient wishes to be able to breathe better and to get back into better shape.        Discharge Exercise Prescription (Final Exercise Prescription Changes): Exercise Prescription Changes - 04/22/18 1400      Response to Exercise   Blood Pressure (Admit)  136/72    Blood Pressure (Exercise)  158/70    Blood Pressure (Exit)  136/72    Heart Rate (Admit)  64 bpm    Heart Rate (Exercise)  96 bpm    Heart Rate (Exit)  92 bpm    Oxygen Saturation (Admit)  95 %    Oxygen Saturation (Exercise)  94 %    Oxygen Saturation (Exit)  93 %    Rating of Perceived Exertion (Exercise)  11    Perceived Dyspnea (Exercise)  11    Duration  Progress to 30 minutes of  aerobic without signs/symptoms of physical distress    Intensity  THRR New [id number]      Progression   Progression  Continue to progress workloads to maintain intensity without signs/symptoms of physical distress.      Resistance Training   Training Prescription  Yes    Weight  5    Reps  10-15      Treadmill   MPH  2.9    Grade  0    Minutes  15    METs  2.3      NuStep   Level  3    SPM  124    Minutes  20    METs  5  Home Exercise Plan   Plans to continue exercise at  Home (comment)    Frequency  Add 2 additional days to program exercise sessions.    Initial Home Exercises Provided  01/08/17       Nutrition:  Target Goals: Understanding of nutrition guidelines, daily intake of sodium 1500mg , cholesterol 200mg , calories 30% from fat and 7% or less from saturated fats, daily to have 5 or more servings of fruits and vegetables.  Biometrics: Pre Biometrics - 01/01/18 0939      Pre Biometrics   Height  5\' 9"  (1.753 m)    Weight  227 lb 9.6 oz (103.2 kg)    Waist Circumference  43.5 inches    Hip Circumference  42 inches    Waist to Hip Ratio  1.04 %    BMI (Calculated)  33.6    Triceps Skinfold  16 mm    % Body Fat  31.5 %    Grip Strength  63.67 kg    Flexibility  0 in    Single Leg Stand  7 seconds        Nutrition Therapy Plan and Nutrition Goals: Nutrition Therapy & Goals  - 04/02/18 1022      Personal Nutrition Goals   Nutrition Goal  For heart healthy choices add >50% of whole grains, make half their plate fruits and vegetables. Discuss the difference between starchy vegetables and leafy greens, and how leafy vegetables provide fiber, helps maintain healthy weight, helps control blood glucose, and lowers cholesterol.  Discuss purchasing fresh or frozen vegetable to reduce sodium and not to add grease, fat or sugar. Consume <18oz of red meat per week. Consume lean cuts of meats and very little of meats high in sodium and nitrates such as pork and lunch meats. Discussed portion control for all food groups.      Personal Goal #2  Eats a heart healthy diet      Intervention Plan   Intervention  Nutrition handout(s) given to patient.    Expected Outcomes  Short Term Goal: Understand basic principles of dietary content, such as calories, fat, sodium, cholesterol and nutrients.       Nutrition Assessments: Nutrition Assessments - 01/01/18 1044      MEDFICTS Scores   Pre Score  53       Nutrition Goals Re-Evaluation: Nutrition Goals Re-Evaluation    [place] Name 04/29/18 1439             Goals   Current Weight  215 lb (97.5 kg)       Nutrition Goal  For heart healthy choices add >50% of whole grains, make half their plate fruits and vegetables. Discuss the difference between starchy vegetables and leafy greens, and how leafy vegetables provide fiber, helps maintain healthy weight, helps control blood glucose, and lowers cholesterol.  Discuss purchasing fresh or frozen vegetable to reduce sodium and not to add grease, fat or sugar. Consume <18oz of red meat per week. Consume lean cuts of meats and very little of meats high in sodium and nitrates such as pork and lunch meats. Discussed portion control for all food groups.         Comment  Patient has lost 12.6 lbs since he started the program. He continues to say he is eating a healthy diet. Will continue to  monitor.        Expected Outcome  Patient will continue to meet his nutritionaly goals.  Personal Goal #2 Re-Evaluation   Personal Goal #2  Eats a heart healthy diet          Nutrition Goals Discharge (Final Nutrition Goals Re-Evaluation): Nutrition Goals Re-Evaluation - 04/29/18 1439      Goals   Current Weight  215 lb (97.5 kg)    Nutrition Goal  For heart healthy choices add >50% of whole grains, make half their plate fruits and vegetables. Discuss the difference between starchy vegetables and leafy greens, and how leafy vegetables provide fiber, helps maintain healthy weight, helps control blood glucose, and lowers cholesterol.  Discuss purchasing fresh or frozen vegetable to reduce sodium and not to add grease, fat or sugar. Consume <18oz of red meat per week. Consume lean cuts of meats and very little of meats high in sodium and nitrates such as pork and lunch meats. Discussed portion control for all food groups.      Comment  Patient has lost 12.6 lbs since he started the program. He continues to say he is eating a healthy diet. Will continue to monitor.     Expected Outcome  Patient will continue to meet his nutritionaly goals.       Personal Goal #2 Re-Evaluation   Personal Goal #2  Eats a heart healthy diet       Psychosocial: Target Goals: Acknowledge presence or absence of significant depression and/or stress, maximize coping skills, provide positive support system. Participant is able to verbalize types and ability to use techniques and skills needed for reducing stress and depression.  Initial Review & Psychosocial Screening: Initial Psych Review & Screening - 01/01/18 1053      Initial Review   Current issues with  None Identified      Family Dynamics   Good Support System?  Yes      Barriers   Psychosocial barriers to participate in program  There are no identifiable barriers or psychosocial needs.      Screening Interventions   Interventions  Encouraged  to exercise       Quality of Life Scores: Quality of Life - 01/01/18 0940      Quality of Life Scores   Health/Function Pre  20.67 %    Socioeconomic Pre  28.8 %    Psych/Spiritual Pre  26.57 %    Family Pre  28.8 %    GLOBAL Pre  24.5 %      Scores of 19 and below usually indicate a poorer quality of life in these areas.  A difference of  2-3 points is a clinically meaningful difference.  A difference of 2-3 points in the total score of the Quality of Life Index has been associated with significant improvement in overall quality of life, self-image, physical symptoms, and general health in studies assessing change in quality of life.   PHQ-9: Recent Review Flowsheet Data    Depression screen [facility] 2/9 01/01/2018 02/04/2017 10/09/2016   Decreased Interest 0 0 0   Down, Depressed, Hopeless 0 0 0   PHQ - 2 Score 0 0 0   Altered sleeping 1 - -   Tired, decreased energy 1 - -   Change in appetite 0 - -   Feeling bad or failure about yourself  0 - -   Trouble concentrating 0 - -   Moving slowly or fidgety/restless 0 - -   Suicidal thoughts 0 - -   PHQ-9 Score 2 - -   Difficult doing work/chores Somewhat difficult - -  Interpretation of Total Score  Total Score Depression Severity:  1-4 = Minimal depression, 5-9 = Mild depression, 10-14 = Moderate depression, 15-19 = Moderately severe depression, 20-27 = Severe depression   Psychosocial Evaluation and Intervention: Psychosocial Evaluation - 01/01/18 1054      Psychosocial Evaluation & Interventions   Interventions  Encouraged to exercise with the program and follow exercise prescription    Continue Psychosocial Services   No Follow up required       Psychosocial Re-Evaluation: Psychosocial Re-Evaluation    [place] Name 02/11/18 1348 03/05/18 1256 04/02/18 1025 04/29/18 1442       Psychosocial Re-Evaluation   Current issues with  None Identified  None Identified  None Identified  None Identified    Comments  Patient's initial  QOL score was 24.50 and his PHQ-9 score was 2.   Patient's initial QOL score was 24.50 and his PHQ-9 score was 2.   Patient's initial QOL score was 24.50 and his PHQ-9 score was 2.   Patient's initial QOL score was 24.50 and his PHQ-9 score was 2.     Expected Outcomes  Patient will have no psychosocial issues identified at discharge.   Patient will have no psychosocial issues identified at discharge.   Patient will have no psychosocial issues identified at discharge.   Patient will have no psychosocial issues identified at discharge.     Interventions  Stress management education;Encouraged to attend Pulmonary Rehabilitation for the exercise;Relaxation education  Stress management education;Encouraged to attend Pulmonary Rehabilitation for the exercise;Relaxation education  Stress management education;Encouraged to attend Pulmonary Rehabilitation for the exercise;Relaxation education  Stress management education;Encouraged to attend Pulmonary Rehabilitation for the exercise;Relaxation education    Continue Psychosocial Services   No Follow up required  No Follow up required  No Follow up required  No Follow up required       Psychosocial Discharge (Final Psychosocial Re-Evaluation): Psychosocial Re-Evaluation - 04/29/18 1442      Psychosocial Re-Evaluation   Current issues with  None Identified    Comments  Patient's initial QOL score was 24.50 and his PHQ-9 score was 2.     Expected Outcomes  Patient will have no psychosocial issues identified at discharge.     Interventions  Stress management education;Encouraged to attend Pulmonary Rehabilitation for the exercise;Relaxation education    Continue Psychosocial Services   No Follow up required       Education: Education Goals: Education classes will be provided on a weekly basis, covering required topics. Participant will state understanding/return demonstration of topics presented.  Learning Barriers/Preferences: Learning  Barriers/Preferences - 01/01/18 1000      Learning Barriers/Preferences   Learning Barriers  Hearing    Learning Preferences  Group Instruction;Skilled Demonstration       Education Topics: Risk Factor Reduction:  -Group instruction that is supported by a PowerPoint presentation. Instructor discusses the definition of a risk factor, different risk factors for pulmonary disease, and how the heart and lungs work together.     Nutrition for Pulmonary Patient:  -Group instruction provided by PowerPoint slides, verbal discussion, and written materials to support subject matter. The instructor gives an explanation and review of healthy diet recommendations, which includes a discussion on weight management, recommendations for fruit and vegetable consumption, as well as protein, fluid, caffeine, fiber, sodium, sugar, and alcohol. Tips for eating when patients are short of breath are discussed.   Pursed Lip Breathing:  -Group instruction that is supported by demonstration and informational handouts. Instructor discusses the benefits  of pursed lip and diaphragmatic breathing and detailed demonstration on how to preform both.     Oxygen Safety:  -Group instruction provided by PowerPoint, verbal discussion, and written material to support subject matter. There is an overview of "What is Oxygen" and "Why do we need it".  Instructor also reviews how to create a safe environment for oxygen use, the importance of using oxygen as prescribed, and the risks of noncompliance. There is a brief discussion on traveling with oxygen and resources the patient may utilize.   Oxygen Equipment:  -Group instruction provided by [facility] Staff utilizing handouts, written materials, and equipment demonstrations.   Signs and Symptoms:  -Group instruction provided by written material and verbal discussion to support subject matter. Warning signs and symptoms of infection, stroke, and heart attack are reviewed and  when to call the physician/911 reinforced. Tips for preventing the spread of infection discussed.   Advanced Directives:  -Group instruction provided by verbal instruction and written material to support subject matter. Instructor reviews Advanced Directive laws and proper instruction for filling out document.   Pulmonary Video:  -Group video education that reviews the importance of medication and oxygen compliance, exercise, good nutrition, pulmonary hygiene, and pursed lip and diaphragmatic breathing for the pulmonary patient.   Exercise for the Pulmonary Patient:  -Group instruction that is supported by a PowerPoint presentation. Instructor discusses benefits of exercise, core components of exercise, frequency, duration, and intensity of an exercise routine, importance of utilizing pulse oximetry during exercise, safety while exercising, and options of places to exercise outside of rehab.     Pulmonary Medications:  -Verbally interactive group education provided by instructor with focus on inhaled medications and proper administration.   Anatomy and Physiology of the Respiratory System and Intimacy:  -Group instruction provided by PowerPoint, verbal discussion, and written material to support subject matter. Instructor reviews respiratory cycle and anatomical components of the respiratory system and their functions. Instructor also reviews differences in obstructive and restrictive respiratory diseases with examples of each. Intimacy, Sex, and Sexuality differences are reviewed with a discussion on how relationships can change when diagnosed with pulmonary disease. Common sexual concerns are reviewed.   MD DAY -A group question and answer session with a medical doctor that allows participants to ask questions that relate to their pulmonary disease state.   OTHER EDUCATION -Group or individual verbal, written, or video instructions that support the educational goals of the pulmonary  rehab program.   Holiday Eating Survival Tips:  -Group instruction provided by PowerPoint slides, verbal discussion, and written materials to support subject matter. The instructor gives patients tips, tricks, and techniques to help them not only survive but enjoy the holidays despite the onslaught of food that accompanies the holidays.   Knowledge Questionnaire Score: Knowledge Questionnaire Score - 01/01/18 1012      Knowledge Questionnaire Score   Pre Score  14/18       Core Components/Risk Factors/Patient Goals at Admission: Personal Goals and Risk Factors at Admission - 01/01/18 1048      Core Components/Risk Factors/Patient Goals on Admission    Weight Management  Yes    Intervention  Weight Management/Obesity: Establish reasonable short term and long term weight goals.    Admit Weight  227 lb 9.6 oz (103.2 kg)    Goal Weight: Short Term  217 lb 9.6 oz (98.7 kg)    Goal Weight: Long Term  207 lb 9.6 oz (94.2 kg)    Expected Outcomes  Short Term: Continue  to assess and modify interventions until short term weight is achieved;Long Term: Adherence to nutrition and physical activity/exercise program aimed toward attainment of established weight goal    Personal Goal Other  Yes    Personal Goal  Breathe better, lose 45lbs, get back in physical shape    Intervention  Attend program 2 x week and supplement with home exercise 3 x week.     Expected Outcomes  Attain personal goals.        Core Components/Risk Factors/Patient Goals Review:  Goals and Risk Factor Review    Row Name 02/11/18 1344 03/05/18 1252 04/02/18 1023 04/29/18 1440       Core Components/Risk Factors/Patient Goals Review   Personal Goals Review  Weight Management/Obesity;Improve shortness of breath with ADL's Breathe better; be in better shape; lose 45 lbs long term; play golf and visit again.   Weight Management/Obesity;Improve shortness of breath with ADL's Breathe better; be in better shape; lose 45 lbs long  term; Play golf and visit again.  Weight Management/Obesity;Improve shortness of breath with ADL's Breathe better; be in better shape; lose 45 lbs long term; play golf and visit again.   Weight Management/Obesity;Improve shortness of breath with ADL's Breathe better; be in better shape; lose 45 lbs long term; play golf and visit again.     Review  Patient has completed 11 sessions losing 4 lbs. He is doing well in the program with progression. He says he is getting less SOB with activities and says he had noted less hoarsness when he talks. He feels better overall and is stronger. He played golf last week without difficulty and is able to visit some again. Will continue to monitor.   Patient has completed 18 sessions losing 4 lbs. He continues to do well in the program with continued progression. He continues to say he is getting better with less SOB. He is still hoarse but is breathing better. He has not played golf recently due to osteoarthritis but does feel strong enough to play. Will continue to monitor.  Patient has completed 24 sessions losing 12.1 lbs overall but gaining 15 lbs since his last 30 day review. Patient says he is eating healthy. He is doing well in the program with progression. He continues to have less SOB and feels like he is getting in shape. Will continue to monitor for progress.   Patient has completed 32 sessions. He continues to do well in the program with progression. He says he continues to improve feeling stronger and having more energy to do things around the house. He continues to visit friends and is playing golf without difficulty. He continues to feel less SOB. Will continue to monitor for progress.     Expected Outcomes  Patient will continue to attend sessions and complete the program meeting his personal goals.   Patient will continue to attend sessions and complete the program meeting his personal goals.   Patient will continue to attend sessions and complete the program  meeting his personal goals.   Patient will continue to attend sessions and complete the program meeting his personal goals.        Core Components/Risk Factors/Patient Goals at Discharge (Final Review):  Goals and Risk Factor Review - 04/29/18 1440      Core Components/Risk Factors/Patient Goals Review   Personal Goals Review  Weight Management/Obesity;Improve shortness of breath with ADL's Breathe better; be in better shape; lose 45 lbs long term; play golf and visit again.  Review  Patient has completed 32 sessions. He continues to do well in the program with progression. He says he continues to improve feeling stronger and having more energy to do things around the house. He continues to visit friends and is playing golf without difficulty. He continues to feel less SOB. Will continue to monitor for progress.     Expected Outcomes  Patient will continue to attend sessions and complete the program meeting his personal goals.        ITP Comments: ITP Comments    Row Name 01/12/18 1254           ITP Comments  Patient new to program completing 3 sessions. Will continue to monitor.           Comments: ITP 30 Day REVIEW Pt is making expected progress toward pulmonary rehab goals after completing 32 sessions. Recommend continued exercise, life style modification, education, and utilization of breathing techniques to increase stamina and strength and decrease shortness of breath with exertion.

## 2018-04-30 ENCOUNTER — Encounter (HOSPITAL_COMMUNITY)
Admission: RE | Admit: 2018-04-30 | Discharge: 2018-04-30 | Disposition: A | Discharge status: Home / Self Care | Payer: Medicare Other | Source: Ambulatory Visit | Attending: Internal Medicine | Admitting: Internal Medicine

## 2018-04-30 DIAGNOSIS — R0609 Other forms of dyspnea: Secondary | ICD-10-CM

## 2018-04-30 DIAGNOSIS — R0600 Dyspnea, unspecified: Secondary | ICD-10-CM | POA: Insufficient documentation

## 2018-04-30 NOTE — Progress Notes (Signed)
Daily Session Note  Patient Details  Name: [name] MRN: [id number] Date of Birth: 10/08/1951 Referring Provider:     PULMONARY REHAB OTHER RESP ORIENTATION from 01/01/2018 in [facility]  Referring Provider  Dr. Ramaswamy      Encounter Date: 04/30/2018  Check In: Session Check In - 04/30/18 1045      Check-In   Location  [facility]    Staff Present  Diane Coad, MS, EP, CHC, Exercise Physiologist;Gregory Cowan, BS, EP, Exercise Physiologist; , RN, BSN    Supervising physician immediately available to respond to emergencies  See telemetry face sheet for immediately available MD    Medication changes reported      No    Fall or balance concerns reported     No    Warm-up and Cool-down  Performed as group-led instruction    Resistance Training Performed  Yes    VAD Patient?  No      Pain Assessment   Currently in Pain?  No/denies    Pain Score  0-No pain    Multiple Pain Sites  No       Capillary Blood Glucose: No results found for this or any previous visit (from the past 24 hour(s)).    Social History   Tobacco Use  Smoking Status Former Smoker  . Packs/day: 3.00  . Years: 20.00  . Pack years: 60.00  . Types: Cigarettes  . Last attempt to quit: 12/30/1989  . Years since quitting: 28.3  Smokeless Tobacco Never Used    Goals Met:  Proper associated with RPD/PD & O2 Sat Independence with exercise equipment Improved SOB with ADL's Using PLB without cueing & demonstrates good technique Exercise tolerated well Personal goals reviewed No report of cardiac concerns or symptoms Strength training completed today  Goals Unmet:  Not Applicable  Comments: Check out 1145.   Dr. Edward Hawkins is Medical Director for [facility]. 

## 2018-05-05 ENCOUNTER — Encounter (HOSPITAL_COMMUNITY)
Admission: RE | Admit: 2018-05-05 | Discharge: 2018-05-05 | Disposition: A | Discharge status: Home / Self Care | Payer: Medicare Other | Source: Ambulatory Visit | Attending: Internal Medicine | Admitting: Internal Medicine

## 2018-05-05 DIAGNOSIS — R0609 Other forms of dyspnea: Principal | ICD-10-CM

## 2018-05-05 DIAGNOSIS — R0600 Dyspnea, unspecified: Secondary | ICD-10-CM | POA: Diagnosis not present

## 2018-05-05 DIAGNOSIS — E876 Hypokalemia: Secondary | ICD-10-CM | POA: Diagnosis not present

## 2018-05-05 NOTE — Progress Notes (Signed)
Daily Session Note  Patient Details  Name: [name] MRN: [id number] Date of Birth: 12-21-1951 Referring Provider:     PULMONARY REHAB OTHER RESP ORIENTATION from 01/01/2018 in [place]  Referring Provider  Dr. Chase Caller      Encounter Date: 05/05/2018  Check In: Session Check In - 05/05/18 1102      Check-In   Location  [facility]    Staff Present  [name], MS, EP, [facility], Exercise Physiologist;Yannick Steuber Luther Parody, BS, EP, Exercise Physiologist    Supervising physician immediately available to respond to emergencies  See telemetry face sheet for immediately available MD    Medication changes reported      No    Fall or balance concerns reported     No    Warm-up and Cool-down  Performed as group-led instruction    Resistance Training Performed  Yes    VAD Patient?  No      Pain Assessment   Currently in Pain?  No/denies    Pain Score  0-No pain    Multiple Pain Sites  No       Capillary Blood Glucose: No results found for this or any previous visit (from the past 24 hour(s)).  Exercise Prescription Changes - 05/05/18 0800      Response to Exercise   Blood Pressure (Admit)  136/64    Blood Pressure (Exercise)  146/60    Blood Pressure (Exit)  128/72    Heart Rate (Admit)  78 bpm    Heart Rate (Exercise)  105 bpm    Heart Rate (Exit)  93 bpm    Oxygen Saturation (Admit)  95 %    Oxygen Saturation (Exercise)  92 %    Oxygen Saturation (Exit)  93 %    Rating of Perceived Exertion (Exercise)  11    Perceived Dyspnea (Exercise)  11    Duration  Progress to 30 minutes of  aerobic without signs/symptoms of physical distress    Intensity  THRR New [phone]      Progression   Progression  Continue to progress workloads to maintain intensity without signs/symptoms of physical distress.      Resistance Training   Training Prescription  Yes    Weight  5    Reps  10-15      Treadmill   MPH  3    Grade  0    Minutes  15    METs  3.2      NuStep   Level  3    SPM  114    Minutes  20    METs  3.9      Home Exercise Plan   Plans to continue exercise at  Home (comment)    Frequency  Add 2 additional days to program exercise sessions.    Initial Home Exercises Provided  01/08/17       Social History   Tobacco Use  Smoking Status Former Smoker  . Packs/day: 3.00  . Years: 20.00  . Pack years: 60.00  . Types: Cigarettes  . Last attempt to quit: 12/30/1989  . Years since quitting: 28.3  Smokeless Tobacco Never Used    Goals Met:  Independence with exercise equipment Improved SOB with ADL's Using PLB without cueing & demonstrates good technique Exercise tolerated well No report of cardiac concerns or symptoms Strength training completed today  Goals Unmet:  Not Applicable  Comments: Check out 1145   Dr. Sinda Du is Medical  Director for [employer].

## 2018-05-07 ENCOUNTER — Encounter (HOSPITAL_COMMUNITY)
Admission: RE | Admit: 2018-05-07 | Discharge: 2018-05-07 | Disposition: A | Discharge status: Home / Self Care | Payer: Medicare Other | Source: Ambulatory Visit | Attending: Internal Medicine | Admitting: Internal Medicine

## 2018-05-07 DIAGNOSIS — R0600 Dyspnea, unspecified: Secondary | ICD-10-CM | POA: Diagnosis not present

## 2018-05-07 NOTE — Progress Notes (Signed)
Daily Session Note  Patient Details  Name: [name] MRN: [id number] Date of Birth: 1951-03-06 Referring Provider:     PULMONARY REHAB OTHER RESP ORIENTATION from 01/01/2018 in [place]  Referring Provider  Dr. Chase Caller      Encounter Date: 05/07/2018  Check In: Session Check In - 05/07/18 1045      Check-In   Location  [facility]    Staff Present  Esli Jernigan Angelina Pih, MS, EP, [facility], Exercise Physiologist;Debra Wynetta Emery, RN, BSN    Supervising physician immediately available to respond to emergencies  See telemetry face sheet for immediately available MD    Medication changes reported      No    Fall or balance concerns reported     No    Warm-up and Cool-down  Performed as group-led instruction    Resistance Training Performed  Yes    VAD Patient?  No      Pain Assessment   Currently in Pain?  No/denies    Pain Score  0-No pain    Multiple Pain Sites  No       Capillary Blood Glucose: No results found for this or any previous visit (from the past 24 hour(s)).    Social History   Tobacco Use  Smoking Status Former Smoker  . Packs/day: 3.00  . Years: 20.00  . Pack years: 60.00  . Types: Cigarettes  . Last attempt to quit: 12/30/1989  . Years since quitting: 28.3  Smokeless Tobacco Never Used    Goals Met:  Proper associated with RPD/PD & O2 Sat Improved SOB with ADL's Exercise tolerated well Queuing for purse lip breathing No report of cardiac concerns or symptoms Strength training completed today  Goals Unmet:  Not Applicable  Comments: Check out: 11:45   Dr. Sinda Du is Medical Director for [facility].

## 2018-05-12 ENCOUNTER — Encounter (HOSPITAL_COMMUNITY)
Admission: RE | Admit: 2018-05-12 | Discharge: 2018-05-12 | Disposition: A | Discharge status: Home / Self Care | Payer: Medicare Other | Source: Ambulatory Visit | Attending: Internal Medicine | Admitting: Internal Medicine

## 2018-05-12 ENCOUNTER — Other Ambulatory Visit: Payer: Self-pay | Admitting: Internal Medicine

## 2018-05-12 VITALS — Ht 69.0 in | Wt 214.9 lb

## 2018-05-12 DIAGNOSIS — R0600 Dyspnea, unspecified: Secondary | ICD-10-CM

## 2018-05-12 DIAGNOSIS — R0609 Other forms of dyspnea: Principal | ICD-10-CM

## 2018-05-12 NOTE — Progress Notes (Signed)
Daily Session Note  Patient Details  Name: [name] MRN: [id number] Date of Birth: 04/29/1951 Referring Provider:     PULMONARY REHAB OTHER RESP ORIENTATION from 01/01/2018 in [place] [facility]  Referring Provider  Dr. Ramaswamy      Encounter Date: 05/12/2018  Check In: Session Check In - 05/12/18 1045      Check-In   Location  [facility]    Staff Present   , MS, EP, [facility], Exercise Physiologist;Gregory Cowan, BS, EP, Exercise Physiologist    Supervising physician immediately available to respond to emergencies  See telemetry face sheet for immediately available MD    Medication changes reported      No    Fall or balance concerns reported     No    Warm-up and Cool-down  Performed as group-led instruction    Resistance Training Performed  Yes    VAD Patient?  No      Pain Assessment   Currently in Pain?  No/denies    Pain Score  0-No pain    Multiple Pain Sites  No       Capillary Blood Glucose: No results found for this or any previous visit (from the past 24 hour(s)).    Social History   Tobacco Use  Smoking Status Former Smoker  . Packs/day: 3.00  . Years: 20.00  . Pack years: 60.00  . Types: Cigarettes  . Last attempt to quit: 12/30/1989  . Years since quitting: 28.3  Smokeless Tobacco Never Used    Goals Met:  Proper associated with RPD/PD & O2 Sat Improved SOB with ADL's Exercise tolerated well No report of cardiac concerns or symptoms Strength training completed today  Goals Unmet:  Not Applicable  Comments: Check out: 11:45   Dr. Edward Hawkins is Medical Director for [facility]. 

## 2018-05-13 NOTE — Progress Notes (Signed)
Pulmonary Individual Treatment Plan  Patient Details  Name: [name] MRN: [id number] Date of Birth: 09/01/1951 Referring Provider:     PULMONARY REHAB OTHER RESP ORIENTATION from 01/01/2018 in [facility]  Referring Provider  Dr. Chase Caller      Initial Encounter Date:    [facility] from 01/01/2018 in [facility]  Date  01/01/18  Referring Provider  Dr. Chase Caller      Visit Diagnosis: Dyspnea on exertion  Patient's Home Medications on Admission:   Current Outpatient Medications:  .  acetaminophen (TYLENOL) 500 MG tablet, Take 1,000 mg by mouth every 6 (six) hours as needed (arthritis pain)., Disp: , Rfl:  .  albuterol (PROVENTIL) (2.5 MG/3ML) 0.083% nebulizer solution, Take 3 mLs (2.5 mg total) by nebulization every 6 (six) hours as needed for wheezing or shortness of breath., Disp: 75 mL, Rfl: 12 .  albuterol (PROVENTIL) 2 MG tablet, Take 2 mg by mouth 2 (two) times daily., Disp: , Rfl:  .  alendronate (FOSAMAX) 70 MG tablet, TAKE 1 TABLET BY MOUTH ONCE A WEEK. TAKE WITH FULL GLASS OF WATER ON EMPTY STOMACH, Disp: 12 tablet, Rfl: 0 .  allopurinol (ZYLOPRIM) 100 MG tablet, TAKE 1 TABLET BY MOUTH TWICE A DAY, Disp: 180 tablet, Rfl: 0 .  BREO ELLIPTA 100-25 MCG/INH AEPB, TAKE 1 PUFF BY MOUTH EVERY DAY, Disp: 60 each, Rfl: 0 .  Calcium-Phosphorus-Vitamin D (CITRACAL +D3 PO), Take 2 tablets by mouth 2 (two) times daily. , Disp: , Rfl:  .  dicyclomine (BENTYL) 10 MG capsule, TAKE 1 CAPSULE BY MOUTH 4 TIMES DAILY BEFORE MEALS AND AT BEDTIME (Patient taking differently: TAKE 2 CAPSULE BY MOUTH TWO TIMES DAILY), Disp: 120 capsule, Rfl: 0 .  folic acid (FOLVITE) 1 MG tablet, TAKE 2 TABLETS BY MOUTH EVERY MORNING, Disp: 180 tablet, Rfl: 4 .  furosemide (LASIX) 40 MG tablet, TAKE 1 TABLET BY MOUTH TWO TIMES DAILY (Patient taking differently: TAKE 2 TABLETS BY MOUTH (Evenings)), Disp: 60 tablet, Rfl: 8 .  INCRUSE ELLIPTA  62.5 MCG/INH AEPB, INHALE 1 PUFF INTO LUNGS DAILY, Disp: 30 each, Rfl: 3 .  leflunomide (ARAVA) 20 MG tablet, TAKE 1 TABLET BY MOUTH EVERY DAY, Disp: 30 tablet, Rfl: 2 .  omeprazole (PRILOSEC) 40 MG capsule, Take 40 mg by mouth 2 (two) times daily. , Disp: , Rfl: 2 .  Oxymetazoline HCl (NASAL SPRAY NA), Place 2 sprays into the nose daily as needed (allergies)., Disp: , Rfl:  .  phenytoin (DILANTIN) 100 MG ER capsule, Take 100-200 mg by mouth 2 (two) times daily. Take one capsule in the morning and 2 capsules at bedtime, Disp: , Rfl:  .  PROAIR HFA 108 (90 Base) MCG/ACT inhaler, Inhale 1-2 puffs into the lungs every 6 (six) hours as needed. , Disp: , Rfl:  .  Tocilizumab (ACTEMRA IV), Inject into the vein every 28 (twenty-eight) days. For Rheumatoid Arthritis, Disp: , Rfl:  .  warfarin (COUMADIN) 3 MG tablet, Take 3 mg by mouth at bedtime. , Disp: , Rfl:   Past Medical History: Past Medical History:  Diagnosis Date  . Anxiety    hx of   . Arthritis   . Asthma   . Clotting disorder (HCC)    DVT both legs   . COPD (chronic obstructive pulmonary disease) ([place])   . DDD (degenerative disc disease), cervical    with UE's paresthesias  . Diverticulosis   . DVT, lower extremity ([place])  bilat  . 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 GERD (gastroesophageal reflux disease)   . Gout   . Heart murmur   . History of measles   . History of shingles   . Hypercholesterolemia   . IBS (irritable bowel syndrome)   . IBS (irritable bowel syndrome)   . Peripheral edema   . Pneumonia    hx of   . Prostate cancer ([employer])   . Seizures ([place])    last seizure 20 years ago   . Shortness of breath dyspnea    exertion   . Sleep apnea    not on cpap  . Tubular adenoma of colon 04/2008    Tobacco Use: Social History   Tobacco Use  Smoking Status Former Smoker  . Packs/day: 3.00  . Years: 20.00  . Pack years: 60.00  . Types: Cigarettes   . Last attempt to quit: 12/30/1989  . Years since quitting: 28.3  Smokeless Tobacco Never Used    Labs: Recent Review Scientist, physiological    Labs for ITP Cardiac and Pulmonary Rehab Latest Ref Rng & Units 07/01/2010   TCO2 0 - 100 mmol/L 27      Capillary Blood Glucose: Lab Results  Component Value Date   GLUCAP 101 (H) 11/28/2008     Pulmonary Assessment Scores: Pulmonary Assessment Scores    Row Name 01/01/18 1017 05/13/18 1509       ADL UCSD   ADL Phase  Entry  Exit    SOB Score total  18  51    Rest  0  1    Walk  2  8    Stairs  2  4    Bath  1  2    Dress  1  2    Shop  1  2      CAT Score   CAT Score  10  12      mMRC Score   mMRC Score  2  2       Pulmonary Function Assessment: Pulmonary Function Assessment - 01/01/18 1013      Pulmonary Function Tests   FVC%  49 %    FEV1%  54 %    FEV1/FVC Ratio  110    DLCO%  63 %      Initial Spirometry Results   FVC%  49 %    FEV1%  54 %    FEV1/FVC Ratio  110      Post Bronchodilator Spirometry Results   FVC%  48 %    FEV1%  54 %    FEV1/FVC Ratio  113      Breath   Bilateral Breath Sounds  Clear    Shortness of Breath  No       Exercise Target Goals:    Exercise Program Goal: Individual exercise prescription set using results from initial 6 min walk test and THRR while considering  patient's activity barriers and safety.   Exercise Prescription Goal: Initial exercise prescription builds to 30-45 minutes a day of aerobic activity, 2-3 days per week.  Home exercise guidelines will be given to patient during program as part of exercise prescription that the participant will acknowledge.  Activity Barriers & Risk Stratification: Activity Barriers & Cardiac Risk Stratification - 01/01/18 0936      Activity Barriers & Cardiac Risk Stratification   Activity Barriers  Back Problems;Deconditioning    Cardiac  Risk Stratification  Low       6 Minute Walk: 6 Minute Walk    Row Name 01/01/18 0932  05/13/18 1415       6 Minute Walk   Phase  Initial  Discharge    Distance  1200 feet  1550 feet    Distance % Change  0 %  29.17 %    Distance Feet Change  0 ft  350 ft    Walk Time  6 minutes  6 minutes    # of Rest Breaks  0  0    MPH  2.27  2.93    METS  2.74  3.25    RPE  9  12    Perceived Dyspnea   9  12    VO2 Peak  9.54  12.45    Symptoms  No  No    Resting HR  84 bpm  80 bpm    Resting BP  136/67  120/70    Resting Oxygen Saturation   94 %  96 %    Exercise Oxygen Saturation  during 6 min walk  91 %  95 %    Max Ex. HR  105 bpm  106 bpm    Max Ex. BP  146/74  160/74    2 Minute Post BP  136/66  130/72       Oxygen Initial Assessment: Oxygen Initial Assessment - 01/01/18 1023      Home Oxygen   Home Oxygen Device  None    Sleep Oxygen Prescription  None    Home Exercise Oxygen Prescription  None    Home at Rest Exercise Oxygen Prescription  None      Initial 6 min Walk   Oxygen Used  None      Program Oxygen Prescription   Program Oxygen Prescription  None      Intervention   Short Term Goals  To learn and understand importance of monitoring SPO2 with pulse oximeter and demonstrate accurate use of the pulse oximeter.;To learn and understand importance of maintaining oxygen saturations>88%;To learn and demonstrate proper pursed lip breathing techniques or other breathing techniques.    Long  Term Goals  Verbalizes importance of monitoring SPO2 with pulse oximeter and return demonstration;Maintenance of O2 saturations>88%;Exhibits proper breathing techniques, such as pursed lip breathing or other method taught during program session       Oxygen Re-Evaluation: Oxygen Re-Evaluation    Row Name 02/11/18 1342 03/05/18 1252 04/02/18 1021 04/29/18 1438       Program Oxygen Prescription   Program Oxygen Prescription  None  None  None  None      Home Oxygen   Home Oxygen Device  None  None  None  None    Sleep Oxygen Prescription  None  None  None  None     Home Exercise Oxygen Prescription  None  None  None  None    Home at Rest Exercise Oxygen Prescription  None  None  None  None    Compliance with Home Oxygen Use  -  Yes  -  Yes      Goals/Expected Outcomes   Short Term Goals  To learn and understand importance of monitoring SPO2 with pulse oximeter and demonstrate accurate use of the pulse oximeter.;To learn and understand importance of maintaining oxygen saturations>88%;To learn and demonstrate proper pursed lip breathing techniques or other breathing techniques.  To learn and understand importance of monitoring SPO2 with pulse oximeter and  demonstrate accurate use of the pulse oximeter.;To learn and understand importance of maintaining oxygen saturations>88%;To learn and demonstrate proper pursed lip breathing techniques or other breathing techniques.  To learn and understand importance of monitoring SPO2 with pulse oximeter and demonstrate accurate use of the pulse oximeter.;To learn and understand importance of maintaining oxygen saturations>88%;To learn and demonstrate proper pursed lip breathing techniques or other breathing techniques.  To learn and understand importance of monitoring SPO2 with pulse oximeter and demonstrate accurate use of the pulse oximeter.;To learn and understand importance of maintaining oxygen saturations>88%;To learn and demonstrate proper pursed lip breathing techniques or other breathing techniques.    Long  Term Goals  Verbalizes importance of monitoring SPO2 with pulse oximeter and return demonstration;Maintenance of O2 saturations>88%;Exhibits proper breathing techniques, such as pursed lip breathing or other method taught during program session  Verbalizes importance of monitoring SPO2 with pulse oximeter and return demonstration;Maintenance of O2 saturations>88%;Exhibits proper breathing techniques, such as pursed lip breathing or other method taught during program session  Verbalizes importance of monitoring SPO2  with pulse oximeter and return demonstration;Maintenance of O2 saturations>88%;Exhibits proper breathing techniques, such as pursed lip breathing or other method taught during program session  Verbalizes importance of monitoring SPO2 with pulse oximeter and return demonstration;Maintenance of O2 saturations>88%;Exhibits proper breathing techniques, such as pursed lip breathing or other method taught during program session    Comments  Patient is meeting both short and long term goals.   Patient is meeting both short and long term goals.   Patient is meeting both short and long term goals.   Patient is meeting both short and long term goals.     Goals/Expected Outcomes  Patient will continue to meet both his short and long term goals.   Patient will continue to meet both his short and long term goals.   Patient will continue to meet both his short and long term goals.   Patient will continue to meet both his short and long term goals.        Oxygen Discharge (Final Oxygen Re-Evaluation): Oxygen Re-Evaluation - 04/29/18 1438      Program Oxygen Prescription   Program Oxygen Prescription  None      Home Oxygen   Home Oxygen Device  None    Sleep Oxygen Prescription  None    Home Exercise Oxygen Prescription  None    Home at Rest Exercise Oxygen Prescription  None    Compliance with Home Oxygen Use  Yes      Goals/Expected Outcomes   Short Term Goals  To learn and understand importance of monitoring SPO2 with pulse oximeter and demonstrate accurate use of the pulse oximeter.;To learn and understand importance of maintaining oxygen saturations>88%;To learn and demonstrate proper pursed lip breathing techniques or other breathing techniques.    Long  Term Goals  Verbalizes importance of monitoring SPO2 with pulse oximeter and return demonstration;Maintenance of O2 saturations>88%;Exhibits proper breathing techniques, such as pursed lip breathing or other method taught during program session     Comments  Patient is meeting both short and long term goals.     Goals/Expected Outcomes  Patient will continue to meet both his short and long term goals.        Initial Exercise Prescription: Initial Exercise Prescription - 01/01/18 0900      Date of Initial Exercise RX and Referring Provider   Date  01/01/18    Referring Provider  Dr. Chase Caller      Treadmill  MPH  2    Grade  0    Minutes  15    METs  2.5      NuStep   Level  2    SPM  97    Minutes  20    METs  1.9      Prescription Details   Frequency (times per week)  2    Duration  Progress to 30 minutes of continuous aerobic without signs/symptoms of physical distress      Intensity   THRR 40-80% of Max Heartrate  [phone]    Ratings of Perceived Exertion  11-13    Perceived Dyspnea  0-4      Progression   Progression  Continue progressive overload as per policy without signs/symptoms or physical distress.      Resistance Training   Training Prescription  Yes    Weight  1    Reps  10-15       Perform Capillary Blood Glucose checks as needed.  Exercise Prescription Changes:  Exercise Prescription Changes    Row Name 01/08/18 1200 01/09/18 1400 01/27/18 1500 02/10/18 0700 03/03/18 0700     Response to Exercise   Blood Pressure (Admit)  -  144/76  132/64  130/68  128/68   Blood Pressure (Exercise)  -  140/80  150/82  144/78  168/72   Blood Pressure (Exit)  -  138/70  132/76  130/70  134/60   Heart Rate (Admit)  -  80 bpm  77 bpm  68 bpm  70 bpm   Heart Rate (Exercise)  -  98 bpm  102 bpm  93 bpm  96 bpm   Heart Rate (Exit)  -  105 bpm  94 bpm  85 bpm  95 bpm   Oxygen Saturation (Admit)  -  93 %  93 %  93 %  92 %   Oxygen Saturation (Exercise)  -  92 %  91 %  92 %  93 %   Oxygen Saturation (Exit)  -  92 %  92 %  94 %  94 %   Rating of Perceived Exertion (Exercise)  -  _0 Perceived Dyspnea (Exercise)  -  _1 Duration  -  Progress to 30 minutes of  aerobic without  signs/symptoms of physical distress  Progress to 30 minutes of  aerobic without signs/symptoms of physical distress  Progress to 30 minutes of  aerobic without signs/symptoms of physical distress  Progress to 30 minutes of  aerobic without signs/symptoms of physical distress   Intensity  -  THRR New [id number]  THRR New [id number]  THRR New 102-119-136  THRR New 103-102-136     Progression   Progression  -  Continue to progress workloads to maintain intensity without signs/symptoms of physical distress.  Continue to progress workloads to maintain intensity without signs/symptoms of physical distress.  Continue to progress workloads to maintain intensity without signs/symptoms of physical distress.  Continue to progress workloads to maintain intensity without signs/symptoms of physical distress.     Resistance Training   Training Prescription  Yes  Yes  Yes  Yes  Yes   Weight  _2 Reps  10-15  10-15  10-15  10-15  10-15     Treadmill   MPH  2  2  2.4  2.4  2.6  Grade  0  0  0  0  0   Minutes  _0 METs  2.5  2.5  2.8  2.8  2.9     NuStep   Level  _1 SPM  97  111  112  116  117   Minutes  _2 METs  1.9  2.7  3.1  3.3  3.7     Home Exercise Plan   Plans to continue exercise at  Home (comment)  Home (comment)  Home (comment)  Home (comment)  Home (comment)   Frequency  Add 2 additional days to program exercise sessions.  Add 2 additional days to program exercise sessions.  Add 2 additional days to program exercise sessions.  Add 2 additional days to program exercise sessions.  Add 2 additional days to program exercise sessions.   Initial Home Exercises Provided  01/08/17  01/08/17  01/08/17  01/08/17  01/08/17   Row Name 03/19/18 0700 03/27/18 1400 04/22/18 1400 05/05/18 0800       Response to Exercise   Blood Pressure (Admit)  140/80  124/60  136/72  136/64    Blood Pressure (Exercise)  140/66  140/66  158/70  146/60    Blood  Pressure (Exit)  136/70  120/62  136/72  128/72    Heart Rate (Admit)  66 bpm  77 bpm  64 bpm  78 bpm    Heart Rate (Exercise)  97 bpm  103 bpm  96 bpm  105 bpm    Heart Rate (Exit)  88 bpm  94 bpm  92 bpm  93 bpm    Oxygen Saturation (Admit)  96 %  92 %  95 %  95 %    Oxygen Saturation (Exercise)  90 %  93 %  94 %  92 %    Oxygen Saturation (Exit)  94 %  93 %  93 %  93 %    Rating of Perceived Exertion (Exercise)  _3 Perceived Dyspnea (Exercise)  _4 Duration  Progress to 30 minutes of  aerobic without signs/symptoms of physical distress  Progress to 30 minutes of  aerobic without signs/symptoms of physical distress  Progress to 30 minutes of  aerobic without signs/symptoms of physical distress  Progress to 30 minutes of  aerobic without signs/symptoms of physical distress    Intensity  THRR New [id number]  THRR New [id number]  THRR New 100-117-135  THRR New [id number]      Progression   Progression  Continue to progress workloads to maintain intensity without signs/symptoms of physical distress.  Continue to progress workloads to maintain intensity without signs/symptoms of physical distress.  Continue to progress workloads to maintain intensity without signs/symptoms of physical distress.  Continue to progress workloads to maintain intensity without signs/symptoms of physical distress.      Resistance Training   Training Prescription  Yes  Yes  Yes  Yes    Weight  _5 Reps  10-15  10-15  10-15  10-15      Treadmill   MPH  2.6  2.9  2.9  3    Grade  0  0  0  0  Minutes  _0 METs  2.9  2.3  2.3  3.2      NuStep   Level  _1 SPM  106  111  124  114    Minutes  _2 METs  3.3  3.5  5  3.9      Home Exercise Plan   Plans to continue exercise at  Home (comment)  Home (comment)  Home (comment)  Home (comment)    Frequency  Add 2 additional days to program exercise sessions.  Add 2 additional days to program  exercise sessions.  Add 2 additional days to program exercise sessions.  Add 2 additional days to program exercise sessions.    Initial Home Exercises Provided  01/08/17  01/08/17  01/08/17  01/08/17       Exercise Comments:  Exercise Comments    Row Name 01/08/18 1257 01/09/18 1431 01/27/18 1519 02/10/18 0747 03/03/18 0759   Exercise Comments  Patient received the take home exercise plan today. THR was addressed as were safety guidelines for being active around the house. Patient addressed an understanding and was encouraged to ask any future questions as they arise.   Patient is doing well in PR and has increased his SPMs on the Nustep. He has jsut started the program but will be progressed more in time. We will help him reach his goals of breathing better and getting back into better shape.   Patient is doing well in CR. He has progressed on the treadmill with a higher speed. He has also maintained his high SPMs on the Nustep with his level   Patient continues to do well in PR. He has increased his level on the Nustep to level three and has maintained his speed on the treadmill as well. Patient states that he feels stronger throughout the program and has stated he can do more when not in the program.   Patient is doing well in PR and has increased his speed on the treadmill to 2.6. Patient has maintained his level on the nustep machine and has kept his watts and SPMs the same levels. Patient states that he feels stronger from the program and more stamina as well so that he is able to do more yard work around the house.    [place] Name 03/19/18 6789 03/27/18 1408 04/22/18 1416 05/05/18 0826     Exercise Comments  Patient is doing well in PR and has maintained his levels on both of his machines and has stated that he feels stronger and has gained more endurance from the program and it helps him with his work around the house.   Patient continues to do do well in PR and has increased his speed on the  treadmill to 2.9. 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   Patient is doing very well in PR and has maintained his level on the treadmill as well as his level on the nustep machine. Patient has increased his SPMs on the nustep machine and has stated to me that he is staying active while not in PR at least 2 other days. Patient works out at a [employer] with family members.   Patient continues to do well in PR. Patient has  increased in speed on the treadmill to 3.0 and still goes very fast on the nustep machine. Patient has stated that he is still active when not in PR and walks regularly and work out at a gym as well.        Exercise Goals and Review:  Exercise Goals    Row Name 01/01/18 0939             Exercise Goals   Increase Physical Activity  Yes       Intervention  Provide advice, education, support and counseling about physical activity/exercise needs.;Develop an individualized exercise prescription for aerobic and resistive training based on initial evaluation findings, risk stratification, comorbidities and participant's personal goals.       Expected Outcomes  Achievement of increased cardiorespiratory fitness and enhanced flexibility, muscular endurance and strength shown through measurements of functional capacity and personal statement of participant.       Increase Strength and Stamina  Yes       Intervention  Provide advice, education, support and counseling about physical activity/exercise needs.;Develop an individualized exercise prescription for aerobic and resistive training based on initial evaluation findings, risk stratification, comorbidities and participant's personal goals.       Expected Outcomes  Achievement of increased cardiorespiratory fitness and enhanced flexibility, muscular endurance and strength shown through measurements of functional capacity and  personal statement of participant.       Able to understand and use rate of perceived exertion (RPE) scale  Yes       Intervention  Provide education and explanation on how to use RPE scale       Expected Outcomes  Short Term: Able to use RPE daily in rehab to express subjective intensity level;Long Term:  Able to use RPE to guide intensity level when exercising independently       Able to understand and use Dyspnea scale  Yes       Intervention  Provide education and explanation on how to use Dyspnea scale       Expected Outcomes  Short Term: Able to use Dyspnea scale daily in rehab to express subjective sense of shortness of breath during exertion;Long Term: Able to use Dyspnea scale to guide intensity level when exercising independently       Knowledge and understanding of Target Heart Rate Range (THRR)  Yes       Intervention  Provide education and explanation of THRR including how the numbers were predicted and where they are located for reference       Expected Outcomes  Short Term: Able to state/look up THRR;Long Term: Able to use THRR to govern intensity when exercising independently;Short Term: Able to use daily as guideline for intensity in rehab       Able to check pulse independently  Yes       Intervention  Provide education and demonstration on how to check pulse in carotid and radial arteries.;Review the importance of being able to check your own pulse for safety during independent exercise       Expected Outcomes  Short Term: Able to explain why pulse checking is important during independent exercise;Long Term: Able to check pulse independently and accurately       Understanding of Exercise Prescription  Yes       Intervention  Provide education, explanation, and written materials on patient's individual exercise prescription       Expected Outcomes  Short Term: Able to explain program exercise prescription;Long Term: Able to explain home  exercise prescription to exercise independently           Exercise Goals Re-Evaluation : Exercise Goals Re-Evaluation    Row Name 01/09/18 1429 02/10/18 0745 03/03/18 0757 03/27/18 1407 04/27/18 1512     Exercise Goal Re-Evaluation   Exercise Goals Review  Increase Physical Activity;Increase Strength and Stamina;Knowledge and understanding of Target Heart Rate Range (THRR)  Increase Physical Activity;Increase Strength and Stamina;Knowledge and understanding of Target Heart Rate Range (THRR)  Increase Physical Activity;Increase Strength and Stamina;Knowledge and understanding of Target Heart Rate Range (THRR)  Increase Physical Activity;Increase Strength and Stamina;Knowledge and understanding of Target Heart Rate Range (THRR)  Increase Physical Activity;Increase Strength and Stamina;Knowledge and understanding of Target Heart Rate Range (THRR)   Comments  Patient is doing well in PR and has increased his SPMs on the Nustep. He has jsut started the program but will be progressed more in time. We will help him reach his goals of breathing better and getting back into better shape.   Patient continues to do well in PR. He has increased his level on the Nustep to level three and has maintained his speed on the treadmill as well. Patient states that he feels stronger throughout the program and has stated he can do more when not in the program.   Patient is doing well in PR and has increased his speed on the treadmill to 2.6. Patient has maintained his level on the nustep machine and has kept his watts and SPMs the same levels. Patient states that he feels stronger from the program and more stamina as well so that he is able to do more yard work around the house.   Patient continues to do do well in PR and has increased his speed on the treadmill to 2.9. 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   Patient continues to do do well in PR and has  increased his speed on the treadmill to 2.9. 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 [place] has also increased his SPMs on the nustep machine as well. 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     Expected Outcomes  Patient wishes to breathe better and to get back into better shape  Patient wishes to be able to breathe better and to get back into better shape.   Patient wishes to be able to breathe better and to get back into better shape.   Patient wishes to be able to breathe better and to get back into better shape.   Patient wishes to be able to breathe better and to get back into better shape.       Discharge Exercise Prescription (Final Exercise Prescription Changes): Exercise Prescription Changes - 05/05/18 0800      Response to Exercise   Blood Pressure (Admit)  136/64    Blood Pressure (Exercise)  146/60    Blood Pressure (Exit)  128/72    Heart Rate (Admit)  78 bpm    Heart Rate (Exercise)  105 bpm    Heart Rate (Exit)  93 bpm    Oxygen Saturation (Admit)  95 %    Oxygen Saturation (Exercise)  92 %    Oxygen Saturation (Exit)  93 %    Rating  of Perceived Exertion (Exercise)  11    Perceived Dyspnea (Exercise)  11    Duration  Progress to 30 minutes of  aerobic without signs/symptoms of physical distress    Intensity  THRR New [phone]      Progression   Progression  Continue to progress workloads to maintain intensity without signs/symptoms of physical distress.      Resistance Training   Training Prescription  Yes    Weight  5    Reps  10-15      Treadmill   MPH  3    Grade  0    Minutes  15    METs  3.2      NuStep   Level  3    SPM  114    Minutes  20    METs  3.9      Home Exercise Plan   Plans to continue exercise at  Home (comment)    Frequency  Add 2 additional days to program exercise sessions.    Initial Home  Exercises Provided  01/08/17       Nutrition:  Target Goals: Understanding of nutrition guidelines, daily intake of sodium <1561m, cholesterol <2047m calories 30% from fat and 7% or less from saturated fats, daily to have 5 or more servings of fruits and vegetables.  Biometrics: Pre Biometrics - 01/01/18 0939      Pre Biometrics   Height  5' 9" (1.753 m)    Weight  227 lb 9.6 oz (103.2 kg)    Waist Circumference  43.5 inches    Hip Circumference  42 inches    Waist to Hip Ratio  1.04 %    BMI (Calculated)  33.6    Triceps Skinfold  16 mm    % Body Fat  31.5 %    Grip Strength  63.67 kg    Flexibility  0 in    Single Leg Stand  7 seconds      Post Biometrics - 05/13/18 1415       Post  Biometrics   Height  5' 9" (1.753 m)    Weight  214 lb 15.2 oz (97.5 kg)    Waist Circumference  41 inches    Hip Circumference  38 inches    Waist to Hip Ratio  1.08 %    BMI (Calculated)  31.73    Triceps Skinfold  15 mm    % Body Fat  29.4 %    Grip Strength  61.8 kg    Flexibility  0 in    Single Leg Stand  4 seconds       Nutrition Therapy Plan and Nutrition Goals: Nutrition Therapy & Goals - 04/02/18 1022      Personal Nutrition Goals   Nutrition Goal  For heart healthy choices add >50% of whole grains, make half their plate fruits and vegetables. Discuss the difference between starchy vegetables and leafy greens, and how leafy vegetables provide fiber, helps maintain healthy weight, helps control blood glucose, and lowers cholesterol.  Discuss purchasing fresh or frozen vegetable to reduce sodium and not to add grease, fat or sugar. Consume <18oz of red meat per week. Consume lean cuts of meats and very little of meats high in sodium and nitrates such as pork and lunch meats. Discussed portion control for all food groups.      Personal Goal #2  Eats a heart healthy diet      Intervention Plan   Intervention  Nutrition  handout(s) given to patient.    Expected Outcomes  Short  Term Goal: Understand basic principles of dietary content, such as calories, fat, sodium, cholesterol and nutrients.       Nutrition Assessments: Nutrition Assessments - 05/13/18 1513      MEDFICTS Scores   Pre Score  53    Post Score  40    Score Difference  -13       Nutrition Goals Re-Evaluation: Nutrition Goals Re-Evaluation    [place] Name 04/29/18 1439             Goals   Current Weight  215 lb (97.5 kg)       Nutrition Goal  For heart healthy choices add >50% of whole grains, make half their plate fruits and vegetables. Discuss the difference between starchy vegetables and leafy greens, and how leafy vegetables provide fiber, helps maintain healthy weight, helps control blood glucose, and lowers cholesterol.  Discuss purchasing fresh or frozen vegetable to reduce sodium and not to add grease, fat or sugar. Consume <18oz of red meat per week. Consume lean cuts of meats and very little of meats high in sodium and nitrates such as pork and lunch meats. Discussed portion control for all food groups.         Comment  Patient has lost 12.6 lbs since he started the program. He continues to say he is eating a healthy diet. Will continue to monitor.        Expected Outcome  Patient will continue to meet his nutritionaly goals.          Personal Goal #2 Re-Evaluation   Personal Goal #2  Eats a heart healthy diet          Nutrition Goals Discharge (Final Nutrition Goals Re-Evaluation): Nutrition Goals Re-Evaluation - 04/29/18 1439      Goals   Current Weight  215 lb (97.5 kg)    Nutrition Goal  For heart healthy choices add >50% of whole grains, make half their plate fruits and vegetables. Discuss the difference between starchy vegetables and leafy greens, and how leafy vegetables provide fiber, helps maintain healthy weight, helps control blood glucose, and lowers cholesterol.  Discuss purchasing fresh or frozen vegetable to reduce sodium and not to add grease, fat or sugar. Consume  <18oz of red meat per week. Consume lean cuts of meats and very little of meats high in sodium and nitrates such as pork and lunch meats. Discussed portion control for all food groups.      Comment  Patient has lost 12.6 lbs since he started the program. He continues to say he is eating a healthy diet. Will continue to monitor.     Expected Outcome  Patient will continue to meet his nutritionaly goals.       Personal Goal #2 Re-Evaluation   Personal Goal #2  Eats a heart healthy diet       Psychosocial: Target Goals: Acknowledge presence or absence of significant depression and/or stress, maximize coping skills, provide positive support system. Participant is able to verbalize types and ability to use techniques and skills needed for reducing stress and depression.  Initial Review & Psychosocial Screening: Initial Psych Review & Screening - 01/01/18 1053      Initial Review   Current issues with  None Identified      Family Dynamics   Good Support System?  Yes      Barriers   Psychosocial barriers to participate in program  There are no  identifiable barriers or psychosocial needs.      Screening Interventions   Interventions  Encouraged to exercise       Quality of Life Scores: Quality of Life - 05/13/18 1417      Quality of Life Scores   Health/Function Pre  20.67 %    Health/Function Post  21 %    Health/Function % Change  1.6 %    Socioeconomic Pre  28.8 %    Socioeconomic Post  28.33 %    Socioeconomic % Change   -1.63 %    Psych/Spiritual Pre  26.57 %    Psych/Spiritual Post  25.5 %    Psych/Spiritual % Change  -4.03 %    Family Pre  28.8 %    Family Post  27.6 %    Family % Change  -4.17 %    GLOBAL Pre  24.5 %    GLOBAL Post  24.19 %    GLOBAL % Change  -1.27 %      Scores of 19 and below usually indicate a poorer quality of life in these areas.  A difference of  2-3 points is a clinically meaningful difference.  A difference of 2-3 points in the total score  of the Quality of Life Index has been associated with significant improvement in overall quality of life, self-image, physical symptoms, and general health in studies assessing change in quality of life.   PHQ-9: Recent Review Flowsheet Data    Depression screen [facility] 2/9 05/13/2018 01/01/2018 02/04/2017 10/09/2016   Decreased Interest 0 0 0 0   Down, Depressed, Hopeless 0 0 0 0   PHQ - 2 Score 0 0 0 0   Altered sleeping 3 1 - -   Tired, decreased energy 1 1 - -   Change in appetite 0 0 - -   Feeling bad or failure about yourself  0 0 - -   Trouble concentrating 0 0 - -   Moving slowly or fidgety/restless 0 0 - -   Suicidal thoughts 0 0 - -   PHQ-9 Score 4 2 - -   Difficult doing work/chores - Somewhat difficult - -     Interpretation of Total Score  Total Score Depression Severity:  1-4 = Minimal depression, 5-9 = Mild depression, 10-14 = Moderate depression, 15-19 = Moderately severe depression, 20-27 = Severe depression   Psychosocial Evaluation and Intervention: Psychosocial Evaluation - 05/13/18 1520      Discharge Psychosocial Assessment & Intervention   Comments  Patient has no psychsocial issues identified at discharge. His exit QOL score did decrease by 1.27% at 24.17% and his exit PHQ-9 score was 4 and his initial was 2.       Psychosocial Re-Evaluation: Psychosocial Re-Evaluation    [place] Name 02/11/18 1348 03/05/18 1256 04/02/18 1025 04/29/18 1442       Psychosocial Re-Evaluation   Current issues with  None Identified  None Identified  None Identified  None Identified    Comments  Patient's initial QOL score was 24.50 and his PHQ-9 score was 2.   Patient's initial QOL score was 24.50 and his PHQ-9 score was 2.   Patient's initial QOL score was 24.50 and his PHQ-9 score was 2.   Patient's initial QOL score was 24.50 and his PHQ-9 score was 2.     Expected Outcomes  Patient will have no psychosocial issues identified at discharge.   Patient will have no psychosocial issues  identified at discharge.   Patient will  have no psychosocial issues identified at discharge.   Patient will have no psychosocial issues identified at discharge.     Interventions  Stress management education;Encouraged to attend Pulmonary Rehabilitation for the exercise;Relaxation education  Stress management education;Encouraged to attend Pulmonary Rehabilitation for the exercise;Relaxation education  Stress management education;Encouraged to attend Pulmonary Rehabilitation for the exercise;Relaxation education  Stress management education;Encouraged to attend Pulmonary Rehabilitation for the exercise;Relaxation education    Continue Psychosocial Services   No Follow up required  No Follow up required  No Follow up required  No Follow up required       Psychosocial Discharge (Final Psychosocial Re-Evaluation): Psychosocial Re-Evaluation - 04/29/18 1442      Psychosocial Re-Evaluation   Current issues with  None Identified    Comments  Patient's initial QOL score was 24.50 and his PHQ-9 score was 2.     Expected Outcomes  Patient will have no psychosocial issues identified at discharge.     Interventions  Stress management education;Encouraged to attend Pulmonary Rehabilitation for the exercise;Relaxation education    Continue Psychosocial Services   No Follow up required        Education: Education Goals: Education classes will be provided on a weekly basis, covering required topics. Participant will state understanding/return demonstration of topics presented.  Learning Barriers/Preferences: Learning Barriers/Preferences - 01/01/18 1000      Learning Barriers/Preferences   Learning Barriers  Hearing    Learning Preferences  Group Instruction;Skilled Demonstration       Education Topics: How Lungs Work and Diseases: - Discuss the anatomy of the lungs and diseases that can affect the lungs, such as COPD.   PULMONARY REHAB OTHER RESPIRATORY from 04/30/2018 in [place]  Date  02/05/18  Educator  DJ  Instruction Review Code  2- Demonstrated Understanding      Exercise: -Discuss the importance of exercise, FITT principles of exercise, normal and abnormal responses to exercise, and how to exercise safely.   PULMONARY REHAB OTHER RESPIRATORY from 04/30/2018 in [place]  Date  01/29/18  Educator  [place]  Instruction Review Code  2- Demonstrated Understanding      Environmental Irritants: -Discuss types of environmental irritants and how to limit exposure to environmental irritants.   PULMONARY REHAB OTHER RESPIRATORY from 04/30/2018 in [place]  Date  02/12/18  Educator  DC  Instruction Review Code  2- Demonstrated Understanding      Meds/Inhalers and oxygen: - Discuss respiratory medications, definition of an inhaler and oxygen, and the proper way to use an inhaler and oxygen.   PULMONARY REHAB OTHER RESPIRATORY from 04/30/2018 in [place]  Date  02/19/18  Educator  DC      Energy Saving Techniques: - Discuss methods to conserve energy and decrease shortness of breath when performing activities of daily living.    PULMONARY REHAB OTHER RESPIRATORY from 04/30/2018 in [place]  Date  02/26/18  Educator  DC  Instruction Review Code  2- Demonstrated Understanding      Bronchial Hygiene / Breathing Techniques: - Discuss breathing mechanics, pursed-lip breathing technique,  proper posture, effective ways to clear airways, and other functional breathing techniques   PULMONARY REHAB OTHER RESPIRATORY from 04/30/2018 in [place]  Date  03/05/18  Educator  DJ  Instruction Review Code  2- Demonstrated Understanding      Cleaning Equipment: - Provides group verbal and written instruction about the health risks of elevated stress, cause  of high stress, and healthy ways to reduce stress.   PULMONARY REHAB OTHER RESPIRATORY  from 04/30/2018 in [place]  Date  03/12/18  Educator  [place]  Instruction Review Code  2- Demonstrated Understanding      Nutrition I: Fats: - Discuss the types of cholesterol, what cholesterol does to the body, and how cholesterol levels can be controlled.   PULMONARY REHAB OTHER RESPIRATORY from 04/30/2018 in [place]  Date  03/19/18  Educator  DJ  Instruction Review Code  2- Demonstrated Understanding      Nutrition II: Labels: -Discuss the different components of food labels and how to read food labels.   PULMONARY REHAB OTHER RESPIRATORY from 04/30/2018 in [place]  Date  03/26/18  Educator  DJ  Instruction Review Code  2- Demonstrated Understanding      Respiratory Infections: - Discuss the signs and symptoms of respiratory infections, ways to prevent respiratory infections, and the importance of seeking medical treatment when having a respiratory infection.   PULMONARY REHAB OTHER RESPIRATORY from 04/30/2018 in [place]  Date  04/02/18  Educator  DC  Instruction Review Code  2- Demonstrated Understanding      Stress I: Signs and Symptoms: - Discuss the causes of stress, how stress may lead to anxiety and depression, and ways to limit stress.   PULMONARY REHAB OTHER RESPIRATORY from 04/30/2018 in [place]  Date  01/08/18  Educator  Marya Amsler C.  Instruction Review Code  2- Demonstrated Understanding      Stress II: Relaxation: -Discuss relaxation techniques to limit stress.   PULMONARY REHAB OTHER RESPIRATORY from 04/30/2018 in [place]  Date  04/16/18  Educator  DJ  Instruction Review Code  2- Demonstrated Understanding      Oxygen for Home/Travel: - Discuss how to prepare for travel when on oxygen and proper ways to transport and store oxygen to ensure safety.   PULMONARY REHAB OTHER RESPIRATORY from 04/30/2018 in [place]  Date  01/22/18  Educator  DJ  Instruction Review Code  2- Demonstrated Understanding      Knowledge Questionnaire Score: Knowledge Questionnaire Score - 05/13/18 1508      Knowledge Questionnaire Score   Pre Score  14/18    Post Score  15/18       Core Components/Risk Factors/Patient Goals at Admission: Personal Goals and Risk Factors at Admission - 01/01/18 1048      Core Components/Risk Factors/Patient Goals on Admission    Weight Management  Yes    Intervention  Weight Management/Obesity: Establish reasonable short term and long term weight goals.    Admit Weight  227 lb 9.6 oz (103.2 kg)    Goal Weight: Short Term  217 lb 9.6 oz (98.7 kg)    Goal Weight: Long Term  207 lb 9.6 oz (94.2 kg)    Expected Outcomes  Short Term: Continue to assess and modify interventions until short term weight is achieved;Long Term: Adherence to nutrition and physical activity/exercise program aimed toward attainment of established weight goal    Personal Goal Other  Yes    Personal Goal  Breathe better, lose 45lbs, get back in physical shape    Intervention  Attend program 2 x week and supplement with home exercise 3 x week.     Expected Outcomes  Attain personal goals.        Core Components/Risk Factors/Patient Goals Review:  Goals and  Risk Factor Review    Row Name 02/11/18 1344 03/05/18 1252 04/02/18 1023 04/29/18 1440 05/13/18 1515     Core Components/Risk Factors/Patient Goals Review   Personal Goals Review  Weight Management/Obesity;Improve shortness of breath with ADL's Breathe better; be in better shape; lose 45 lbs long term; play golf and visit again.   Weight Management/Obesity;Improve shortness of breath with ADL's Breathe better; be in better shape; lose 45 lbs long term; Play golf and visit again.  Weight Management/Obesity;Improve shortness of breath with ADL's Breathe better; be in better shape; lose 45 lbs long term; play golf and visit again.   Weight  Management/Obesity;Improve shortness of breath with ADL's Breathe better; be in better shape; lose 45 lbs long term; play golf and visit again.   Weight Management/Obesity;Improve shortness of breath with ADL's Be able to get in better shape; breathe better.    Review  Patient has completed 11 sessions losing 4 lbs. He is doing well in the program with progression. He says he is getting less SOB with activities and says he had noted less hoarsness when he talks. He feels better overall and is stronger. He played golf last week without difficulty and is able to visit some again. Will continue to monitor.   Patient has completed 18 sessions losing 4 lbs. He continues to do well in the program with continued progression. He continues to say he is getting better with less SOB. He is still hoarse but is breathing better. He has not played golf recently due to osteoarthritis but does feel strong enough to play. Will continue to monitor.  Patient has completed 24 sessions losing 12.1 lbs overall but gaining 15 lbs since his last 30 day review. Patient says he is eating healthy. He is doing well in the program with progression. He continues to have less SOB and feels like he is getting in shape. Will continue to monitor for progress.   Patient has completed 32 sessions. He continues to do well in the program with progression. He says he continues to improve feeling stronger and having more energy to do things around the house. He continues to visit friends and is playing golf without difficulty. He continues to feel less SOB. Will continue to monitor for progress.   Patient graduated with 36 sessions losing 13 lbs. He did well in the program. His exit walk test improved by 29.17%. His exit measurements improved in grip strength. His exit pulmonary assessment scores did not improve but worsened inspite of patient stating he is breathing better. He says he is stronger and feels a lot better. He is able to do his ADL's with  less SOB and without difficulty. He plans to continue exercising by walking. CR will f/u for one year.    Expected Outcomes  Patient will continue to attend sessions and complete the program meeting his personal goals.   Patient will continue to attend sessions and complete the program meeting his personal goals.   Patient will continue to attend sessions and complete the program meeting his personal goals.   Patient will continue to attend sessions and complete the program meeting his personal goals.   Patient will continue exercising by walking and continue to meet his personal goals.       Core Components/Risk Factors/Patient Goals at Discharge (Final Review):  Goals and Risk Factor Review - 05/13/18 1515      Core Components/Risk Factors/Patient Goals Review   Personal Goals Review  Weight Management/Obesity;Improve shortness of breath  with ADL's Be able to get in better shape; breathe better.     Review  Patient graduated with 36 sessions losing 13 lbs. He did well in the program. His exit walk test improved by 29.17%. His exit measurements improved in grip strength. His exit pulmonary assessment scores did not improve but worsened inspite of patient stating he is breathing better. He says he is stronger and feels a lot better. He is able to do his ADL's with less SOB and without difficulty. He plans to continue exercising by walking. CR will f/u for one year.     Expected Outcomes  Patient will continue exercising by walking and continue to meet his personal goals.        ITP Comments: ITP Comments    Row Name 01/12/18 1254           ITP Comments  Patient new to program completing 3 sessions. Will continue to monitor.           Comments: Patient graduated from Pulmonary Rehabilitation today on 05/12/18 after completing 36 sessions. He achieved LTG of 30 minutes of aerobic exercise at Max Met level of 3.9 All patients vitals are WNL. Patient has met with dietician. Discharge instruction  has been reviewed in detail and patient stated an understanding of material given. Patient plans to continue exercising at home by walking. Cardiac Rehab staff will make f/u calls at 1 month, 6 months, and 1 year. Patient had no complaints of any abnormal S/S or pain on their exit visit.

## 2018-05-13 NOTE — Progress Notes (Signed)
Discharge Progress Report  Patient Details  Name: [name] MRN: [id number] Date of Birth: June 25, 1951 Referring Provider:     PULMONARY REHAB OTHER RESP ORIENTATION from 01/01/2018 in [place]  Referring Provider  Dr. Chase Caller       Number of Visits: 36  Reason for Discharge:  Patient reached a stable level of exercise. Patient independent in their exercise. Patient has met program and personal goals.  Smoking History:  Social History   Tobacco Use  Smoking Status Former Smoker  . Packs/day: 3.00  . Years: 20.00  . Pack years: 60.00  . Types: Cigarettes  . Last attempt to quit: 12/30/1989  . Years since quitting: 28.3  Smokeless Tobacco Never Used    Diagnosis:  Dyspnea on exertion  ADL [facility]: Pulmonary Assessment Scores    Row Name 01/01/18 1017 05/13/18 1509       ADL [facility]   ADL Phase  Entry  Exit    SOB Score total  18  51    Rest  0  1    Walk  2  8    Stairs  2  4    Bath  1  2    Dress  1  2    Shop  1  2      CAT Score   CAT Score  10  12      mMRC Score   mMRC Score  2  2       Initial Exercise Prescription: Initial Exercise Prescription - 01/01/18 0900      Date of Initial Exercise RX and Referring Provider   Date  01/01/18    Referring Provider  Dr. Chase Caller      Treadmill   MPH  2    Grade  0    Minutes  15    METs  2.5      NuStep   Level  2    SPM  97    Minutes  20    METs  1.9      Prescription Details   Frequency (times per week)  2    Duration  Progress to 30 minutes of continuous aerobic without signs/symptoms of physical distress      Intensity   THRR 40-80% of Max Heartrate  [phone]    Ratings of Perceived Exertion  11-13    Perceived Dyspnea  0-4      Progression   Progression  Continue progressive overload as per policy without signs/symptoms or physical distress.      Resistance Training   Training Prescription  Yes    Weight  1    Reps  10-15       Discharge  Exercise Prescription (Final Exercise Prescription Changes): Exercise Prescription Changes - 05/05/18 0800      Response to Exercise   Blood Pressure (Admit)  136/64    Blood Pressure (Exercise)  146/60    Blood Pressure (Exit)  128/72    Heart Rate (Admit)  78 bpm    Heart Rate (Exercise)  105 bpm    Heart Rate (Exit)  93 bpm    Oxygen Saturation (Admit)  95 %    Oxygen Saturation (Exercise)  92 %    Oxygen Saturation (Exit)  93 %    Rating of Perceived Exertion (Exercise)  11    Perceived Dyspnea (Exercise)  11    Duration  Progress to 30 minutes of  aerobic without signs/symptoms of physical distress  Intensity  THRR New [phone]      Progression   Progression  Continue to progress workloads to maintain intensity without signs/symptoms of physical distress.      Resistance Training   Training Prescription  Yes    Weight  5    Reps  10-15      Treadmill   MPH  3    Grade  0    Minutes  15    METs  3.2      NuStep   Level  3    SPM  114    Minutes  20    METs  3.9      Home Exercise Plan   Plans to continue exercise at  Home (comment)    Frequency  Add 2 additional days to program exercise sessions.    Initial Home Exercises Provided  01/08/17       Functional Capacity: 6 Minute Walk    Row Name 01/01/18 0932 05/13/18 1415       6 Minute Walk   Phase  Initial  Discharge    Distance  1200 feet  1550 feet    Distance % Change  0 %  29.17 %    Distance Feet Change  0 ft  350 ft    Walk Time  6 minutes  6 minutes    # of Rest Breaks  0  0    MPH  2.27  2.93    METS  2.74  3.25    RPE  9  12    Perceived Dyspnea   9  12    VO2 Peak  9.54  12.45    Symptoms  No  No    Resting HR  84 bpm  80 bpm    Resting BP  136/67  120/70    Resting Oxygen Saturation   94 %  96 %    Exercise Oxygen Saturation  during 6 min walk  91 %  95 %    Max Ex. HR  105 bpm  106 bpm    Max Ex. BP  146/74  160/74    2 Minute Post BP  136/66  130/72       Psychological,  QOL, Others - Outcomes: PHQ 2/9: Depression screen [facility] 2/9 05/13/2018 01/01/2018 02/04/2017 10/09/2016  Decreased Interest 0 0 0 0  Down, Depressed, Hopeless 0 0 0 0  PHQ - 2 Score 0 0 0 0  Altered sleeping 3 1 - -  Tired, decreased energy 1 1 - -  Change in appetite 0 0 - -  Feeling bad or failure about yourself  0 0 - -  Trouble concentrating 0 0 - -  Moving slowly or fidgety/restless 0 0 - -  Suicidal thoughts 0 0 - -  PHQ-9 Score 4 2 - -  Difficult doing work/chores - Somewhat difficult - -    Quality of Life: Quality of Life - 05/13/18 1417      Quality of Life Scores   Health/Function Pre  20.67 %    Health/Function Post  21 %    Health/Function % Change  1.6 %    Socioeconomic Pre  28.8 %    Socioeconomic Post  28.33 %    Socioeconomic % Change   -1.63 %    Psych/Spiritual Pre  26.57 %    Psych/Spiritual Post  25.5 %    Psych/Spiritual % Change  -4.03 %    Family Pre  28.8 %  Family Post  27.6 %    Family % Change  -4.17 %    GLOBAL Pre  24.5 %    GLOBAL Post  24.19 %    GLOBAL % Change  -1.27 %       Personal Goals: Goals established at orientation with interventions provided to work toward goal. Personal Goals and Risk Factors at Admission - 01/01/18 1048      Core Components/Risk Factors/Patient Goals on Admission    Weight Management  Yes    Intervention  Weight Management/Obesity: Establish reasonable short term and long term weight goals.    Admit Weight  227 lb 9.6 oz (103.2 kg)    Goal Weight: Short Term  217 lb 9.6 oz (98.7 kg)    Goal Weight: Long Term  207 lb 9.6 oz (94.2 kg)    Expected Outcomes  Short Term: Continue to assess and modify interventions until short term weight is achieved;Long Term: Adherence to nutrition and physical activity/exercise program aimed toward attainment of established weight goal    Personal Goal Other  Yes    Personal Goal  Breathe better, lose 45lbs, get back in physical shape    Intervention  Attend program 2 x week  and supplement with home exercise 3 x week.     Expected Outcomes  Attain personal goals.         Personal Goals Discharge: Goals and Risk Factor Review    Row Name 02/11/18 1344 03/05/18 1252 04/02/18 1023 04/29/18 1440 05/13/18 1515     Core Components/Risk Factors/Patient Goals Review   Personal Goals Review  Weight Management/Obesity;Improve shortness of breath with ADL's Breathe better; be in better shape; lose 45 lbs long term; play golf and visit again.   Weight Management/Obesity;Improve shortness of breath with ADL's Breathe better; be in better shape; lose 45 lbs long term; Play golf and visit again.  Weight Management/Obesity;Improve shortness of breath with ADL's Breathe better; be in better shape; lose 45 lbs long term; play golf and visit again.   Weight Management/Obesity;Improve shortness of breath with ADL's Breathe better; be in better shape; lose 45 lbs long term; play golf and visit again.   Weight Management/Obesity;Improve shortness of breath with ADL's Be able to get in better shape; breathe better.    Review  Patient has completed 11 sessions losing 4 lbs. He is doing well in the program with progression. He says he is getting less SOB with activities and says he had noted less hoarsness when he talks. He feels better overall and is stronger. He played golf last week without difficulty and is able to visit some again. Will continue to monitor.   Patient has completed 18 sessions losing 4 lbs. He continues to do well in the program with continued progression. He continues to say he is getting better with less SOB. He is still hoarse but is breathing better. He has not played golf recently due to osteoarthritis but does feel strong enough to play. Will continue to monitor.  Patient has completed 24 sessions losing 12.1 lbs overall but gaining 15 lbs since his last 30 day review. Patient says he is eating healthy. He is doing well in the program with progression. He continues to have  less SOB and feels like he is getting in shape. Will continue to monitor for progress.   Patient has completed 32 sessions. He continues to do well in the program with progression. He says he continues to improve feeling stronger and having more  energy to do things around the house. He continues to visit friends and is playing golf without difficulty. He continues to feel less SOB. Will continue to monitor for progress.   Patient graduated with 36 sessions losing 13 lbs. He did well in the program. His exit walk test improved by 29.17%. His exit measurements improved in grip strength. His exit pulmonary assessment scores did not improve but worsened inspite of patient stating he is breathing better. He says he is stronger and feels a lot better. He is able to do his ADL's with less SOB and without difficulty. He plans to continue exercising by walking. CR will f/u for one year.    Expected Outcomes  Patient will continue to attend sessions and complete the program meeting his personal goals.   Patient will continue to attend sessions and complete the program meeting his personal goals.   Patient will continue to attend sessions and complete the program meeting his personal goals.   Patient will continue to attend sessions and complete the program meeting his personal goals.   Patient will continue exercising by walking and continue to meet his personal goals.       Exercise Goals and Review: Exercise Goals    Row Name 01/01/18 0939             Exercise Goals   Increase Physical Activity  Yes       Intervention  Provide advice, education, support and counseling about physical activity/exercise needs.;Develop an individualized exercise prescription for aerobic and resistive training based on initial evaluation findings, risk stratification, comorbidities and participant's personal goals.       Expected Outcomes  Achievement of increased cardiorespiratory fitness and enhanced flexibility, muscular  endurance and strength shown through measurements of functional capacity and personal statement of participant.       Increase Strength and Stamina  Yes       Intervention  Provide advice, education, support and counseling about physical activity/exercise needs.;Develop an individualized exercise prescription for aerobic and resistive training based on initial evaluation findings, risk stratification, comorbidities and participant's personal goals.       Expected Outcomes  Achievement of increased cardiorespiratory fitness and enhanced flexibility, muscular endurance and strength shown through measurements of functional capacity and personal statement of participant.       Able to understand and use rate of perceived exertion (RPE) scale  Yes       Intervention  Provide education and explanation on how to use RPE scale       Expected Outcomes  Short Term: Able to use RPE daily in rehab to express subjective intensity level;Long Term:  Able to use RPE to guide intensity level when exercising independently       Able to understand and use Dyspnea scale  Yes       Intervention  Provide education and explanation on how to use Dyspnea scale       Expected Outcomes  Short Term: Able to use Dyspnea scale daily in rehab to express subjective sense of shortness of breath during exertion;Long Term: Able to use Dyspnea scale to guide intensity level when exercising independently       Knowledge and understanding of Target Heart Rate Range (THRR)  Yes       Intervention  Provide education and explanation of THRR including how the numbers were predicted and where they are located for reference       Expected Outcomes  Short Term: Able to state/look up THRR;Long  Term: Able to use THRR to govern intensity when exercising independently;Short Term: Able to use daily as guideline for intensity in rehab       Able to check pulse independently  Yes       Intervention  Provide education and demonstration on how to check  pulse in carotid and radial arteries.;Review the importance of being able to check your own pulse for safety during independent exercise       Expected Outcomes  Short Term: Able to explain why pulse checking is important during independent exercise;Long Term: Able to check pulse independently and accurately       Understanding of Exercise Prescription  Yes       Intervention  Provide education, explanation, and written materials on patient's individual exercise prescription       Expected Outcomes  Short Term: Able to explain program exercise prescription;Long Term: Able to explain home exercise prescription to exercise independently          Nutrition & Weight - Outcomes: Pre Biometrics - 01/01/18 7544      Pre Biometrics   Height  _0  (1.753 m)    Weight  227 lb 9.6 oz (103.2 kg)    Waist Circumference  43.5 inches    Hip Circumference  42 inches    Waist to Hip Ratio  1.04 %    BMI (Calculated)  33.6    Triceps Skinfold  16 mm    % Body Fat  31.5 %    Grip Strength  63.67 kg    Flexibility  0 in    Single Leg Stand  7 seconds      Post Biometrics - 05/13/18 1415       Post  Biometrics   Height  _1  (1.753 m)    Weight  214 lb 15.2 oz (97.5 kg)    Waist Circumference  41 inches    Hip Circumference  38 inches    Waist to Hip Ratio  1.08 %    BMI (Calculated)  31.73    Triceps Skinfold  15 mm    % Body Fat  29.4 %    Grip Strength  61.8 kg    Flexibility  0 in    Single Leg Stand  4 seconds       Nutrition: Nutrition Therapy & Goals - 04/02/18 1022      Personal Nutrition Goals   Nutrition Goal  For heart healthy choices add >50% of whole grains, make half their plate fruits and vegetables. Discuss the difference between starchy vegetables and leafy greens, and how leafy vegetables provide fiber, helps maintain healthy weight, helps control blood glucose, and lowers cholesterol.  Discuss purchasing fresh or frozen vegetable to reduce sodium and not to add grease,  fat or sugar. Consume <18oz of red meat per week. Consume lean cuts of meats and very little of meats high in sodium and nitrates such as pork and lunch meats. Discussed portion control for all food groups.      Personal Goal #2  Eats a heart healthy diet      Intervention Plan   Intervention  Nutrition handout(s) given to patient.    Expected Outcomes  Short Term Goal: Understand basic principles of dietary content, such as calories, fat, sodium, cholesterol and nutrients.       Nutrition Discharge: Nutrition Assessments - 05/13/18 1513      MEDFICTS Scores   Pre Score  53    Post Score  40    Score Difference  -13       Education Questionnaire Score: Knowledge Questionnaire Score - 05/13/18 1508      Knowledge Questionnaire Score   Pre Score  14/18    Post Score  15/18       Goals reviewed with patient; copy given to patient.

## 2018-05-14 ENCOUNTER — Encounter (HOSPITAL_COMMUNITY): Payer: Medicare Other

## 2018-05-15 ENCOUNTER — Other Ambulatory Visit: Payer: Self-pay | Admitting: *Deleted

## 2018-05-15 NOTE — Progress Notes (Signed)
Infusion orders are current for patient CBC CMP Tylenol Benadryl appointments are up to date and follow up appointment  is scheduled TB gold not due yet.  

## 2018-05-18 ENCOUNTER — Encounter (HOSPITAL_COMMUNITY)
Admission: RE | Admit: 2018-05-18 | Discharge: 2018-05-18 | Disposition: A | Discharge status: Home / Self Care | Payer: Medicare Other | Source: Ambulatory Visit | Attending: Rheumatology | Admitting: Rheumatology

## 2018-05-18 DIAGNOSIS — E6609 Other obesity due to excess calories: Secondary | ICD-10-CM | POA: Diagnosis not present

## 2018-05-18 DIAGNOSIS — R7309 Other abnormal glucose: Secondary | ICD-10-CM | POA: Diagnosis not present

## 2018-05-18 DIAGNOSIS — E669 Obesity, unspecified: Secondary | ICD-10-CM | POA: Diagnosis not present

## 2018-05-18 DIAGNOSIS — Z1389 Encounter for screening for other disorder: Secondary | ICD-10-CM | POA: Diagnosis not present

## 2018-05-18 DIAGNOSIS — I82409 Acute embolism and thrombosis of unspecified deep veins of unspecified lower extremity: Secondary | ICD-10-CM | POA: Diagnosis not present

## 2018-05-18 DIAGNOSIS — Z6833 Body mass index (BMI) 33.0-33.9, adult: Secondary | ICD-10-CM | POA: Diagnosis not present

## 2018-05-18 DIAGNOSIS — M0579 Rheumatoid arthritis with rheumatoid factor of multiple sites without organ or systems involvement: Secondary | ICD-10-CM | POA: Diagnosis not present

## 2018-05-18 DIAGNOSIS — E782 Mixed hyperlipidemia: Secondary | ICD-10-CM | POA: Diagnosis not present

## 2018-05-18 DIAGNOSIS — J449 Chronic obstructive pulmonary disease, unspecified: Secondary | ICD-10-CM | POA: Diagnosis not present

## 2018-05-18 DIAGNOSIS — I8311 Varicose veins of right lower extremity with inflammation: Secondary | ICD-10-CM | POA: Diagnosis not present

## 2018-05-18 DIAGNOSIS — M069 Rheumatoid arthritis, unspecified: Secondary | ICD-10-CM | POA: Diagnosis not present

## 2018-05-18 DIAGNOSIS — M1991 Primary osteoarthritis, unspecified site: Secondary | ICD-10-CM | POA: Diagnosis not present

## 2018-05-18 MED ORDER — ACETAMINOPHEN 325 MG PO TABS: 650.0000 mg | ORAL_TABLET | ORAL | Status: DC

## 2018-05-18 MED ORDER — SODIUM CHLORIDE 0.9 % IV SOLN
400.0000 mg | INTRAVENOUS | Status: DC
  Administered 2018-05-18: 400 mg via INTRAVENOUS
  Filled 2018-05-18: qty 20

## 2018-05-18 MED ORDER — DIPHENHYDRAMINE HCL 25 MG PO CAPS: 25.0000 mg | ORAL_CAPSULE | ORAL | Status: DC

## 2018-05-20 ENCOUNTER — Ambulatory Visit: Payer: Medicare Other | Admitting: Cardiovascular Disease

## 2018-05-26 ENCOUNTER — Other Ambulatory Visit: Payer: Self-pay | Admitting: Rheumatology

## 2018-05-26 NOTE — Telephone Encounter (Signed)
Last visit: 03/18/2018 Next visit: 06/22/2018 Labs: 04/20/2018 Glucose is elevated. Albumin stable. All other lab values are WNL. Uric acid: 03/18/2018 7.3  Okay to refill per Dr. Estanislado Pandy.

## 2018-06-08 DIAGNOSIS — E6609 Other obesity due to excess calories: Secondary | ICD-10-CM | POA: Diagnosis not present

## 2018-06-08 DIAGNOSIS — Z6832 Body mass index (BMI) 32.0-32.9, adult: Secondary | ICD-10-CM | POA: Diagnosis not present

## 2018-06-08 DIAGNOSIS — J069 Acute upper respiratory infection, unspecified: Secondary | ICD-10-CM | POA: Diagnosis not present

## 2018-06-08 NOTE — Progress Notes (Signed)
Office Visit Note  Patient: [name]             Date of Birth: 06/27/1951           MRN: [id number]             PCP: [name], MD Referring: [name], MD Visit Date: 06/22/2018 Occupation: @GUAROCC @    Subjective:  Hand pain   History of Present Illness: [name] is a 67 y.o. male with history of seropositive rheumatoid arthritis, osteoarthritis, gout, and osteoporosis.  He continues to have Actemra infusions every 28 days.  His last infusion was on 06/15/2018.  He continues take Arava 20 mg by mouth daily.  He states that he missed 2 doses of her event this week and has been having increased discomfort in his bilateral hands and left elbow.  He has chronic pain in his neck and the pain is been keeping him up at night.  He denies any knee joint pain at this time.  Denies any pain or swelling in his feet.  He states that he is having some pain at night.  He states that he starts having increased joint pain about 1.5 weeks before his next infusion of Actemra. He denies any recent gout flares.  He continues take allopurinol 200 mg by mouth daily.     Activities of Daily Living:  Patient reports morning stiffness for 20 minutes.   Patient Reports nocturnal pain.  Difficulty dressing/grooming: Denies Difficulty climbing stairs: Denies Difficulty getting out of chair: Denies Difficulty using hands for taps, buttons, cutlery, and/or writing: Reports   Review of Systems  Constitutional: Negative for fatigue, fever and night sweats.  HENT: Negative for ear pain, mouth sores, mouth dryness and nose dryness.   Eyes: Negative for pain, redness, visual disturbance and dryness.  Respiratory: Negative for cough, shortness of breath and difficulty breathing.   Cardiovascular: Positive for swelling in legs/feet. Negative for chest pain, palpitations, hypertension and irregular heartbeat.  Gastrointestinal: Negative for blood in stool, constipation and diarrhea.    Endocrine: Negative for increased urination.  Genitourinary: Negative for difficulty urinating and painful urination.  Musculoskeletal: Positive for arthralgias, joint pain, joint swelling, myalgias, morning stiffness and myalgias. Negative for muscle weakness and muscle tenderness.  Skin: Negative for color change, rash, nodules/bumps, skin tightness, ulcers and sensitivity to sunlight.  Allergic/Immunologic: Negative for susceptible to infections.  Neurological: Negative for dizziness, fainting, numbness, memory loss, night sweats and weakness.  Hematological: Negative for swollen glands.  Psychiatric/Behavioral: Positive for sleep disturbance. Negative for depressed mood. The patient is not nervous/anxious.     PMFS History:  Patient Active Problem List   Diagnosis Date Noted  . Dyspnea on exertion 10/22/2017  . History of seizure disorder 02/27/2017  . Primary osteoarthritis of both hands 02/27/2017  . Primary osteoarthritis of both feet 02/27/2017  . Idiopathic gout of multiple sites 02/27/2017  . High risk medication use 12/29/2016  . History of prostate cancer 12/29/2016  . Primary osteoarthritis of both knees 12/29/2016  . Chronic idiopathic gout involving toe without tophus 12/29/2016  . History of CHF (congestive heart failure) 12/29/2016  . History of COPD 12/29/2016  . Plantar pustular psoriasis 12/29/2016  . DVT (deep venous thrombosis) ([place]) 10/31/2016  . COPD (chronic obstructive pulmonary disease) ([place]) 10/31/2016  . CHF (congestive heart failure) ([place]) 10/31/2016  . Prostate cancer ([place]) 08/30/2015  . Malignant neoplasm of prostate ([place]) 07/04/2015  . Chest pain at rest 07/05/2014  . Weakness  07/05/2014  . Complicated postphlebitic syndrome 11/16/2013  . Personal history of DVT (deep vein thrombosis) 05/18/2013  . Chronic anticoagulation 05/18/2013  . Diverticulosis of colon without hemorrhage 05/18/2013  . Rheumatoid arthritis ([place]) 05/18/2013  . Seizure  disorder ([place]) 05/18/2013  . Hx of adenomatous colonic polyps 05/18/2013  . GERD 05/08/2010  . NAUSEA 05/08/2010  . FLATULENCE-GAS-BLOATING 05/08/2010    Past Medical History:  Diagnosis Date  . Anxiety    hx of   . Arthritis   . Asthma   . Clotting disorder (HCC)    DVT both legs   . COPD (chronic obstructive pulmonary disease) ([place])   . DDD (degenerative disc disease), cervical    with UE's paresthesias  . Diverticulosis   . DVT, lower extremity ([place])    bilat  . 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 GERD (gastroesophageal reflux disease)   . Gout   . Heart murmur   . History of measles   . History of shingles   . Hypercholesterolemia   . IBS (irritable bowel syndrome)   . IBS (irritable bowel syndrome)   . Peripheral edema   . Pneumonia    hx of   . Prostate cancer ([place])   . Seizures ([place])    last seizure 20 years ago   . Shortness of breath dyspnea    exertion   . Sleep apnea    not on cpap  . Tubular adenoma of colon 04/2008    Family History  Problem Relation Age of Onset  . Skin cancer Father   . Stomach cancer Father   . Heart attack Father   . Alzheimer's disease Mother   . Throat cancer Brother   . Stomach cancer Brother   . Lung cancer Brother   . Heart attack Brother   . Heart attack Brother   . Breast cancer Sister   . Heart attack Sister   . Stroke Sister   . Bladder Cancer Sister   . Heart murmur Sister   . Colon cancer Neg Hx   . Colon polyps Neg Hx    Past Surgical History:  Procedure Laterality Date  . CHOLECYSTECTOMY    . COLONOSCOPY    . DOPPLER ECHOCARDIOGRAPHY N/A 01-21-2012   TECHNICALLY DIFFICULT. MILD CONCENTRIC LV HYPERTROPHY. LV CAVITY IS SMALL.. EF=> 55%. TRANSMITRAL SPECTRAL FLOW PATTREN IS SUGGESTIVE OF IMPAIRED LV RELAXATION. RV SYSTOLIC PRESSURE IS 85YIFO. LEFT ATRIAL SIZE IS NORMAL. AV APPEARS MILDLY SCLEROTIC. NO SIGN VALVE DISEASE NOTED.  [name] LYMPHADENECTOMY  Bilateral 08/30/2015   Procedure: PELVIC LYMPHADENECTOMY;  Surgeon: [name], MD;  Location: [facility];  Service: Urology;  Laterality: Bilateral;  . NUCLEAR STRESS TEST N/A 01-21-2012   NORMAL PATTERN OF PERFUSION IN ALL REGIONS. POST STRESS LV SIZE IS NORMAL. NO EVIDENCE OF INDUCIBLE ISCHEMIA. EF 54%.  [name] POLYPECTOMY    . PROSTATE BIOPSY    . ROBOT ASSISTED LAPAROSCOPIC RADICAL PROSTATECTOMY N/A 08/30/2015   Procedure: ROBOTIC ASSISTED LAPAROSCOPIC RADICAL PROSTATECTOMY;  Surgeon: [name], MD;  Location: [facility];  Service: Urology;  Laterality: N/A;  . US VENOUS LOWER EXT Right 03/07/11   PERSISTENT DVT IN RIGHT LOWER EXT.WITH PERSISTANT VISUALIZATION OF HYPOECHOIC THROMBUS WITHIN THE FEMORAL, PROFUNDA FEMORAL AND POPLITEAL VEINS. WHEN COMPARED TO PREVIOUS, CLOT IS NO LONGER IDENTIFIED WITH IN THE RIGHT CFV.   Social History   Social History Narrative  . Not on file  Objective: Vital Signs: BP 139/80 (BP Location: Left Arm, Patient Position: Sitting, Cuff Size: Normal)   Pulse 70   Ht 5\' 9"  (1.753 m)   Wt 214 lb (97.1 kg)   BMI 31.60 kg/m    Physical Exam  Constitutional: He is oriented to person, place, and time. He appears well-developed and well-nourished.  HENT:  Head: Normocephalic and atraumatic.  Eyes: Pupils are equal, round, and reactive to light. Conjunctivae and EOM are normal.  Neck: Normal range of motion. Neck supple.  Cardiovascular: Normal rate, regular rhythm and normal heart sounds.  Pulmonary/Chest: Effort normal and breath sounds normal.  Abdominal: Soft. Bowel sounds are normal.  Lymphadenopathy:    He has no cervical adenopathy.  Neurological: He is alert and oriented to person, place, and time.  Skin: Skin is warm and dry. Capillary refill takes less than 2 seconds.  Psychiatric: He has a normal mood and affect. His behavior is normal.  Nursing note and vitals reviewed.    Musculoskeletal Exam: C-spine limited range of motion.  He has  discomfort in the lower thoracic region.  Lumbar spine good range of motion.  Shoulder joints, elbow joints, wrist joints good range of motion.  No synovitis in the right fifth MCP joint.  Synovial thickening of all MCP joints.  He has PIP and DIP synovial thickening consistent with osteoarthritis.  He has a mucinous cyst present on his second DIP joint.  Hip joints, knee joints, ankle joints good range of motion with no synovitis.  No warmth or effusion of bilateral knee joints.  He has tenderness of the left trochanteric bursa on exam.  CDAI Exam: CDAI Homunculus Exam:   Tenderness:  Right hand: 5th MCP  Swelling:  Right hand: 5th MCP  Joint Counts:  CDAI Tender Joint count: 1 CDAI Swollen Joint count: 1  Global Assessments:  Patient Global Assessment: 4 Provider Global Assessment: 4  CDAI Calculated Score: 10    Investigation: No additional findings.TB Gold: 01/26/2018 Negative  CBC Latest Ref Rng & Units 06/15/2018 04/20/2018 02/23/2018  WBC 4.0 - 10.5 K/uL 7.1 6.5 7.6  Hemoglobin 13.0 - 17.0 g/dL 15.2 14.0 14.8  Hematocrit 39.0 - 52.0 % 46.2 41.6 44.3  Platelets 150 - 400 K/uL 189 174 179   CMP Latest Ref Rng & Units 06/15/2018 04/20/2018 03/18/2018  Glucose 65 - 99 mg/dL 99 135(H) 120(H)  BUN 6 - 20 mg/dL 10 9 11   Creatinine 0.61 - 1.24 mg/dL 1.11 1.17 1.24  Sodium 135 - 145 mmol/L 142 141 142  Potassium 3.5 - 5.1 mmol/L 3.5 4.5 3.2(L)  Chloride 101 - 111 mmol/L 102 104 96(L)  CO2 22 - 32 mmol/L 30 29 36(H)  Calcium 8.9 - 10.3 mg/dL 8.9 9.2 9.1  Total Protein 6.5 - 8.1 g/dL 6.4(L) 6.0(L) -  Total Bilirubin 0.3 - 1.2 mg/dL 0.8 1.2 -  Alkaline Phos 38 - 126 U/L 66 57 -  AST 15 - 41 U/L 29 39 -  ALT 17 - 63 U/L 22 20 -    Imaging: No results found.  Speciality Comments: ACTEMRA 4mg /kg x 4 weeks PPD negative 01/02/17    Procedures:  No procedures performed Allergies: Orencia [abatacept]; Carbamazepine; Celecoxib; Cephalexin; Enbrel [etanercept]; Humira [adalimumab];  Levofloxacin; Sulfa antibiotics; and Sulfasalazine   Assessment / Plan:     Visit Diagnoses: Rheumatoid arthritis involving multiple sites with positive rheumatoid factor ([place]): He has synovitis of his right fifth MCP joint.  He has MCP of MCP joints.  He has  been having increased discomfort in his bilateral hands and bilateral wrists  He goes for Actemra infusions every 28 days.  His last infusion was last week.  He continues take Arava 20 mg daily.  He missed 2 doses of Arava last week.  He has been having increased discomfort since missing his doses.  He was advised to continue to stay compliant with his medications.  A refill of arrival sent to the pharmacy.  He reports that he has breakthrough pain about 1 and half weeks before his next infusion.  High risk medication use -  Arava 20 mg po qd and Actemra IV (05/2017) Allergy to SULFASALAZINE, METHOTREXATE, and ORENCIA. Inadequate response to Enbrel and Humira.  He had CBC and CMP drawn on 06/15/2018.  He will return in September and every 3 months to monitor for drug toxicity.  He has lab work performed with his infusions.  Primary osteoarthritis of both hands: He has PIP and DIP synovial thickening consistent with osteoarthritis of bilateral hands.  He has a mucin cyst present on his second DIP joint.  He is advised to keep this area clean and dry.  He tried to was advised to leave it alone.   Primary osteoarthritis of both knees: No warmth or effusion of bilateral knee joints.  He has good range of motion.  He is not experiencing any discomfort in his knees at this time.  Primary osteoarthritis of both feet: He has osteoarthritic changes in bilateral feet.  He has no discomfort in his feet at this time.  He was proper fitting shoes.  Idiopathic chronic gout of multiple sites without tophus - Allopurinol 200 mg by mouth daily  Age-related osteoporosis without current pathological fracture - 09/24/2017 DXA T score -2.60, BMD 0.571 right femoral  neck. He is on Fosamax 70 mg by mouth every week.  He is tolerating Fosamax well.  Other medical conditions are listed as follows:   History of obesity  History of adenomatous polyp of colon  History of seizure disorder  History of CHF (congestive heart failure)  History of COPD  History of DVT (deep vein thrombosis)  History of prostate cancer    Orders: No orders of the defined types were placed in this encounter.  Meds ordered this encounter  Medications  . leflunomide (ARAVA) 20 MG tablet    Sig: Take 1 tablet (20 mg total) by mouth daily.    Dispense:  90 tablet    Refill:  0    Face-to-face time spent with patient was 30 minutes. >50% of time was spent in counseling and coordination of care.  Follow-Up Instructions: Return in about 5 months (around 11/22/2018) for Rheumatoid arthritis, Osteoarthritis, Gout, Osteoporosis.   Ofilia Neas, PA-C  Note - This record has been created using Dragon software.  Chart creation errors have been sought, but may not always  have been located. Such creation errors do not reflect on  the standard of medical care.

## 2018-06-09 ENCOUNTER — Other Ambulatory Visit: Payer: Self-pay | Admitting: Internal Medicine

## 2018-06-09 ENCOUNTER — Other Ambulatory Visit: Payer: Self-pay | Admitting: Rheumatology

## 2018-06-09 NOTE — Telephone Encounter (Signed)
Last visit: 03/18/2018 Next visit: 06/22/2018 Labs: 04/20/2018 Glucose is elevated. Albumin stable. All other lab values are WNL.  Okay to refill per Dr. Estanislado Pandy.

## 2018-06-10 DIAGNOSIS — C61 Malignant neoplasm of prostate: Secondary | ICD-10-CM | POA: Diagnosis not present

## 2018-06-13 ENCOUNTER — Other Ambulatory Visit: Payer: Self-pay | Admitting: Internal Medicine

## 2018-06-15 ENCOUNTER — Ambulatory Visit (HOSPITAL_COMMUNITY)
Admission: RE | Admit: 2018-06-15 | Discharge: 2018-06-15 | Disposition: A | Discharge status: Home / Self Care | Payer: Medicare Other | Source: Ambulatory Visit | Attending: Rheumatology | Admitting: Rheumatology

## 2018-06-15 DIAGNOSIS — M0579 Rheumatoid arthritis with rheumatoid factor of multiple sites without organ or systems involvement: Secondary | ICD-10-CM | POA: Diagnosis not present

## 2018-06-15 LAB — CBC
HCT: 46.2 % (ref 39.0–52.0)
Hemoglobin: 15.2 g/dL (ref 13.0–17.0)
MCH: 30.3 pg (ref 26.0–34.0)
MCHC: 32.9 g/dL (ref 30.0–36.0)
MCV: 92 fL (ref 78.0–100.0)
Platelets: 189 10*3/uL (ref 150–400)
RBC: 5.02 MIL/uL (ref 4.22–5.81)
RDW: 13.7 % (ref 11.5–15.5)
WBC: 7.1 10*3/uL (ref 4.0–10.5)

## 2018-06-15 LAB — COMPREHENSIVE METABOLIC PANEL
ALT: 22 U/L (ref 17–63)
AST: 29 U/L (ref 15–41)
Albumin: 3.9 g/dL (ref 3.5–5.0)
Alkaline Phosphatase: 66 U/L (ref 38–126)
Anion gap: 10 (ref 5–15)
BUN: 10 mg/dL (ref 6–20)
CO2: 30 mmol/L (ref 22–32)
Calcium: 8.9 mg/dL (ref 8.9–10.3)
Chloride: 102 mmol/L (ref 101–111)
Creatinine, Ser: 1.11 mg/dL (ref 0.61–1.24)
GFR calc Af Amer: >60 mL/min (ref >60)
GFR calc non Af Amer: >60 mL/min (ref >60)
Glucose, Bld: 99 mg/dL (ref 65–99)
Potassium: 3.5 mmol/L (ref 3.5–5.1)
Sodium: 142 mmol/L (ref 135–145)
Total Bilirubin: 0.8 mg/dL (ref 0.3–1.2)
Total Protein: 6.4 g/dL — ABNORMAL LOW (ref 6.5–8.1)

## 2018-06-15 MED ORDER — ACETAMINOPHEN 325 MG PO TABS: 650.0000 mg | ORAL_TABLET | ORAL | Status: DC

## 2018-06-15 MED ORDER — DIPHENHYDRAMINE HCL 25 MG PO CAPS
ORAL_CAPSULE | ORAL | Status: AC
  Filled 2018-06-15: qty 1

## 2018-06-15 MED ORDER — SODIUM CHLORIDE 0.9 % IV SOLN
400.0000 mg | INTRAVENOUS | Status: DC
  Administered 2018-06-15: 400 mg via INTRAVENOUS
  Filled 2018-06-15: qty 20

## 2018-06-15 MED ORDER — ACETAMINOPHEN 325 MG PO TABS
ORAL_TABLET | ORAL | Status: AC
  Filled 2018-06-15: qty 2

## 2018-06-15 MED ORDER — DIPHENHYDRAMINE HCL 25 MG PO CAPS: 25.0000 mg | ORAL_CAPSULE | ORAL | Status: DC

## 2018-06-15 NOTE — Progress Notes (Signed)
CBC is normal.

## 2018-06-17 ENCOUNTER — Ambulatory Visit: Payer: Medicare Other | Admitting: Rheumatology

## 2018-06-17 ENCOUNTER — Ambulatory Visit (INDEPENDENT_AMBULATORY_CARE_PROVIDER_SITE_OTHER): Payer: Medicare Other | Admitting: Urology

## 2018-06-17 DIAGNOSIS — R3915 Urgency of urination: Secondary | ICD-10-CM | POA: Diagnosis not present

## 2018-06-17 DIAGNOSIS — C61 Malignant neoplasm of prostate: Secondary | ICD-10-CM

## 2018-06-18 ENCOUNTER — Telehealth: Payer: Self-pay | Admitting: Internal Medicine

## 2018-06-18 NOTE — Telephone Encounter (Signed)
Attempted to contact pt. I did not receive an answer. There was no option for me to leave a message. Will try back.  

## 2018-06-19 MED ORDER — FLUTICASONE FUROATE-VILANTEROL 100-25 MCG/INH IN AEPB: 1.0000 | INHALATION_SPRAY | Freq: Every day | RESPIRATORY_TRACT | 0 refills | Status: DC

## 2018-06-19 NOTE — Telephone Encounter (Signed)
Called and spoke with pt letting him know we had samples of Breo that I was going to place up front for him to come pick up.  Pt expressed understanding.  While speaking with pt, stated to him that we were needing him to schedule a f/u appt.  Pt expressed understanding.  appt scheduled for pt with SG 6/24 at 10:30.  Nothing further needed.

## 2018-06-22 ENCOUNTER — Ambulatory Visit (INDEPENDENT_AMBULATORY_CARE_PROVIDER_SITE_OTHER): Payer: Medicare Other | Admitting: Physician Assistant

## 2018-06-22 ENCOUNTER — Encounter: Payer: Self-pay | Admitting: Acute Care

## 2018-06-22 ENCOUNTER — Ambulatory Visit (INDEPENDENT_AMBULATORY_CARE_PROVIDER_SITE_OTHER): Payer: Medicare Other | Admitting: Acute Care

## 2018-06-22 ENCOUNTER — Encounter: Payer: Self-pay | Admitting: Physician Assistant

## 2018-06-22 VITALS — BP 139/80 | HR 70 | Ht 69.0 in | Wt 214.0 lb

## 2018-06-22 DIAGNOSIS — Z86718 Personal history of other venous thrombosis and embolism: Secondary | ICD-10-CM

## 2018-06-22 DIAGNOSIS — M19041 Primary osteoarthritis, right hand: Secondary | ICD-10-CM

## 2018-06-22 DIAGNOSIS — M19071 Primary osteoarthritis, right ankle and foot: Secondary | ICD-10-CM | POA: Diagnosis not present

## 2018-06-22 DIAGNOSIS — R0600 Dyspnea, unspecified: Secondary | ICD-10-CM

## 2018-06-22 DIAGNOSIS — Z8679 Personal history of other diseases of the circulatory system: Secondary | ICD-10-CM

## 2018-06-22 DIAGNOSIS — R0609 Other forms of dyspnea: Secondary | ICD-10-CM | POA: Diagnosis not present

## 2018-06-22 DIAGNOSIS — M1A09X0 Idiopathic chronic gout, multiple sites, without tophus (tophi): Secondary | ICD-10-CM | POA: Diagnosis not present

## 2018-06-22 DIAGNOSIS — M0579 Rheumatoid arthritis with rheumatoid factor of multiple sites without organ or systems involvement: Secondary | ICD-10-CM | POA: Diagnosis not present

## 2018-06-22 DIAGNOSIS — Z8669 Personal history of other diseases of the nervous system and sense organs: Secondary | ICD-10-CM | POA: Diagnosis not present

## 2018-06-22 DIAGNOSIS — M810 Age-related osteoporosis without current pathological fracture: Secondary | ICD-10-CM

## 2018-06-22 DIAGNOSIS — M170 Bilateral primary osteoarthritis of knee: Secondary | ICD-10-CM | POA: Diagnosis not present

## 2018-06-22 DIAGNOSIS — Z8709 Personal history of other diseases of the respiratory system: Secondary | ICD-10-CM

## 2018-06-22 DIAGNOSIS — Z79899 Other long term (current) drug therapy: Secondary | ICD-10-CM | POA: Diagnosis not present

## 2018-06-22 DIAGNOSIS — Z86010 Personal history of colonic polyps: Secondary | ICD-10-CM | POA: Diagnosis not present

## 2018-06-22 DIAGNOSIS — M19072 Primary osteoarthritis, left ankle and foot: Secondary | ICD-10-CM

## 2018-06-22 DIAGNOSIS — Z8639 Personal history of other endocrine, nutritional and metabolic disease: Secondary | ICD-10-CM

## 2018-06-22 DIAGNOSIS — M19042 Primary osteoarthritis, left hand: Secondary | ICD-10-CM

## 2018-06-22 DIAGNOSIS — Z8546 Personal history of malignant neoplasm of prostate: Secondary | ICD-10-CM

## 2018-06-22 MED ORDER — LEFLUNOMIDE 20 MG PO TABS: 20.0000 mg | ORAL_TABLET | Freq: Every day | ORAL | 0 refills | Status: DC

## 2018-06-22 NOTE — Progress Notes (Signed)
History of Present Illness [name] is a 67 y.o. male with dyspnea on exertion, and ? COPD. He has RA and is on immunosuppressive therapy. He is followed by Dr. Chase [name]  06/22/2018  6 month follow up. Pt.presents for follow up. He states he is doing well. He has completed his pulmonary rehab which he says has really helped with his exertional shortness of breath. He states he can walk much further and do much more around the house.He is compliant with his Incruse, his Breo and his albuterol tabs 2 mg twice daily.He states he has minimal secretions or cough. He states this is his baseline.He states he has very little wheezing.He states he only wheezes  with exertion.He states he may use his rescue inhaler once a month.He is currently being treated for his RA with Arava once daily, and infusions of Tocilzumab.We discussed that as he is immunocompromised due to his RA treatment, he needs to be proactive in coming in for early signs of infection or flare so we can aggressively treat him. He verbalized understanding. He denies fever, chest pain, orthopnea or hemoptysis.   Test Results:  CBC Latest Ref Rng & Units 06/15/2018 04/20/2018 02/23/2018  WBC 4.0 - 10.5 K/uL 7.1 6.5 7.6  Hemoglobin 13.0 - 17.0 g/dL 15.2 14.0 14.8  Hematocrit 39.0 - 52.0 % 46.2 41.6 44.3  Platelets 150 - 400 K/uL 189 174 179    BMP Latest Ref Rng & Units 06/15/2018 04/20/2018 03/18/2018  Glucose 65 - 99 mg/dL 99 135(H) 120(H)  BUN 6 - 20 mg/dL 10 9 11   Creatinine 0.61 - 1.24 mg/dL 1.11 1.17 1.24  BUN/Creat Ratio 6 - 22 (calc) - - NOT APPLICABLE  Sodium 784 - 145 mmol/L 142 141 142  Potassium 3.5 - 5.1 mmol/L 3.5 4.5 3.2(L)  Chloride 101 - 111 mmol/L 102 104 96(L)  CO2 22 - 32 mmol/L 30 29 36(H)  Calcium 8.9 - 10.3 mg/dL 8.9 9.2 9.1    BNP    Component Value Date/Time   BNP 32.0 07/05/2017 1711    ProBNP    Component Value Date/Time   PROBNP 30.2 09/24/2012 1511    PFT    Component Value Date/Time     FEV1PRE 1.77 11/26/2017 0854   FEV1POST 1.77 11/26/2017 0854   FVCPRE 2.16 11/26/2017 0854   FVCPOST 2.11 11/26/2017 0854   DLCOUNC 19.58 11/26/2017 0854   PREFEV1FVCRT 82 11/26/2017 0854   PSTFEV1FVCRT 84 11/26/2017 0854    No results found.   Past medical hx Past Medical History:  Diagnosis Date  . Anxiety    hx of   . Arthritis   . Asthma   . Clotting disorder (HCC)    DVT both legs   . COPD (chronic obstructive pulmonary disease) ([place])   . DDD (degenerative disc disease), cervical    with UE's paresthesias  . Diverticulosis   . DVT, lower extremity ([place])    bilat  . 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 GERD (gastroesophageal reflux disease)   . Gout   . Heart murmur   . History of measles   . History of shingles   . Hypercholesterolemia   . IBS (irritable bowel syndrome)   . IBS (irritable bowel syndrome)   . Peripheral edema   . Pneumonia    hx of   . Prostate cancer ([place])   . Seizures ([place])    last  seizure 20 years ago   . Shortness of breath dyspnea    exertion   . Sleep apnea    not on cpap  . Tubular adenoma of colon 04/2008     Social History   Tobacco Use  . Smoking status: Former Smoker    Packs/day: 3.00    Years: 20.00    Pack years: 60.00    Types: Cigarettes    Last attempt to quit: 12/30/1989    Years since quitting: 28.4  . Smokeless tobacco: Never Used  Substance Use Topics  . Alcohol use: No    Alcohol/week: 0.0 oz  . Drug use: No    [name] reports that he quit smoking about 28 years ago. His smoking use included cigarettes. He has a 60.00 pack-year smoking history. He has never used smokeless tobacco. He reports that he does not drink alcohol or use drugs.  Tobacco Cessation: Former smoker with a 60 pack year smoking history  Quit in 1991  Past surgical hx, Family hx, Social hx all reviewed.  Current Outpatient Medications on File Prior to Visit  Medication  Sig  . acetaminophen (TYLENOL) 500 MG tablet Take 1,000 mg by mouth every 6 (six) hours as needed (arthritis pain).  [name] albuterol (PROVENTIL) (2.5 MG/3ML) 0.083% nebulizer solution Take 3 mLs (2.5 mg total) by nebulization every 6 (six) hours as needed for wheezing or shortness of breath.  [name] albuterol (PROVENTIL) 2 MG tablet Take 2 mg by mouth daily.   [name] alendronate (FOSAMAX) 70 MG tablet TAKE 1 TABLET BY MOUTH ONCE A WEEK. TAKE WITH FULL GLASS OF WATER ON EMPTY STOMACH  . allopurinol (ZYLOPRIM) 100 MG tablet TAKE 1 TABLET BY MOUTH TWICE A DAY  . BREO ELLIPTA 100-25 MCG/INH AEPB TAKE 1 PUFF BY MOUTH EVERY DAY  . Calcium-Phosphorus-Vitamin D (CITRACAL +D3 PO) Take 2 tablets by mouth 2 (two) times daily.   [name] dicyclomine (BENTYL) 10 MG capsule TAKE 1 CAPSULE BY MOUTH 4 TIMES DAILY BEFORE MEALS AND AT BEDTIME (Patient taking differently: TAKE 2 CAPSULE BY MOUTH TWO TIMES DAILY)  . folic acid (FOLVITE) 1 MG tablet TAKE 2 TABLETS BY MOUTH EVERY MORNING  . furosemide (LASIX) 40 MG tablet TAKE 1 TABLET BY MOUTH TWO TIMES DAILY (Patient taking differently: TAKE 2 TABLETS BY MOUTH (Evenings))  . INCRUSE ELLIPTA 62.5 MCG/INH AEPB INHALE 1 PUFF INTO LUNGS DAILY  . leflunomide (ARAVA) 20 MG tablet Take 1 tablet (20 mg total) by mouth daily.  [name] omeprazole (PRILOSEC) 40 MG capsule Take 40 mg by mouth 2 (two) times daily.   . Oxymetazoline HCl (NASAL SPRAY NA) Place 2 sprays into the nose daily as needed (allergies).  . phenytoin (DILANTIN) 100 MG ER capsule Take 100-200 mg by mouth 2 (two) times daily. Take one capsule in the morning and 2 capsules at bedtime  . PROAIR HFA 108 (90 Base) MCG/ACT inhaler Inhale 1-2 puffs into the lungs every 6 (six) hours as needed.   . Tocilizumab (ACTEMRA IV) Inject into the vein every 28 (twenty-eight) days. For Rheumatoid Arthritis  . warfarin (COUMADIN) 3 MG tablet Take 3 mg by mouth at bedtime.    No current facility-administered medications on file prior to visit.       Allergies  Allergen Reactions  . Orencia [Abatacept] Anaphylaxis    Had prostate cancer  . Carbamazepine Rash    REACTION: makes drowsy BRAND NAME IS TEGRETOL  . Celecoxib Itching and Rash    BRAND NAME IS CELEBREX  .  Cephalexin Rash    "Severe Rash".  BRAND NAME IS KEFLEX.   . Enbrel [Etanercept] Rash  . Humira [Adalimumab] Rash  . Levofloxacin Other (See Comments)    REACTION: GI Intolerance BRAND NAME IS LEVAQUIN  . Sulfa Antibiotics Rash  . Sulfasalazine Rash    Review Of Systems:  Constitutional:   No  weight loss, night sweats,  Fevers, chills, + exertional fatigue, or  lassitude.  HEENT:   No headaches,  Difficulty swallowing,  Tooth/dental problems, or  Sore throat,                No sneezing, itching, ear ache, nasal congestion, post nasal drip,   CV:  No chest pain,  Orthopnea, PND, swelling in lower extremities, anasarca, dizziness, palpitations, syncope.   GI  No heartburn, indigestion, abdominal pain, nausea, vomiting, diarrhea, change in bowel habits, loss of appetite, bloody stools.   Resp: + but improved  shortness of breath with exertion or at rest.  No excess mucus, no productive cough,  No non-productive cough,  No coughing up of blood.  No change in color of mucus.  No wheezing.  No chest wall deformity  Skin: no rash or lesions.  GU: no dysuria, change in color of urine, no urgency or frequency.  No flank pain, no hematuria   MS:  No joint pain or swelling.  No decreased range of motion.  No back pain.  Psych:  No change in mood or affect. No depression or anxiety.  No memory loss.   Vital Signs BP 136/76 (BP Location: Left Arm, Cuff Size: Normal)   Pulse 65   Ht 5\' 9"  (1.753 m)   Wt 211 lb 12.8 oz (96.1 kg)   SpO2 96%   BMI 31.28 kg/m    Physical Exam:  General- No distress,  A&Ox3, pleasant ENT: No sinus tenderness, TM clear, pale nasal mucosa, no oral exudate,no post nasal drip, no LAN Cardiac: S1, S2, regular rate and rhythm, no  murmur Chest: No wheeze/ rales/ dullness; no accessory muscle use, no nasal flaring, no sternal retractions, diminished per bases Abd.: Soft Non-tender, ND, BS + Ext: No clubbing cyanosis, edema Neuro:  normal strength, MAE x 4. A&O x 3, appropriate Skin: No rashes, warm and dry Psych: normal mood and behavior   Assessment/Plan  Dyspnea on exertion Greatly improved after Pulmonary rehab Has not continued with maintenance as he plans to use his treadmill at home. Plan: Continue the Breo and Incruse daily as you have been doing. Remember to rinse mouth after use  Continue albuterol tabs as you have been doing. Rescue inhaler as needed up to every 4 hours. Follow up with Dr. Geoffery Spruce in 6 months Note your daily symptoms > remember "red flags" for COPD:  Increase in cough, increase in sputum production, increase in shortness of breath or worsening activity tolerance. If you notice these symptoms, please call to be seen.   Please contact office for sooner follow up if symptoms do not improve or worsen or seek emergency care    Make sure you continue to maintain the exercise regimen. Follow up with the Maintenance plan as needed at [facility].  Magdalen Spatz, NP 06/22/2018  4:24 PM

## 2018-06-22 NOTE — Patient Instructions (Addendum)
It is good to see you  Continue the Breo and Incruse daily as you have been doing. Remember to rinse mouth after use  Continue albuterol tabs as you have been doing. Rescue inhaler as needed up to every 4 hours. Follow up with Dr. Geoffery Spruce in 6 months Note your daily symptoms > remember "red flags" for COPD:  Increase in cough, increase in sputum production, increase in shortness of breath or worsening activity tolerance. If you notice these symptoms, please call to be seen.   Please contact office for sooner follow up if symptoms do not improve or worsen or seek emergency care

## 2018-06-22 NOTE — Assessment & Plan Note (Signed)
Greatly improved after Pulmonary rehab Has not continued with maintenance as he plans to use his treadmill at home. Plan: Continue the Breo and Incruse daily as you have been doing. Remember to rinse mouth after use  Continue albuterol tabs as you have been doing. Rescue inhaler as needed up to every 4 hours. Follow up with Dr. Geoffery Spruce in 6 months Note your daily symptoms > remember "red flags" for COPD:  Increase in cough, increase in sputum production, increase in shortness of breath or worsening activity tolerance. If you notice these symptoms, please call to be seen.   Please contact office for sooner follow up if symptoms do not improve or worsen or seek emergency care

## 2018-06-22 NOTE — Patient Instructions (Signed)
Standing Labs We placed an order today for your standing lab work.    Please come back and get your standing labs in September and every 3 months   We have open lab Monday through Friday from 8:30-11:30 AM and 1:30-4:00 PM  at the office of Dr. Shaili Deveshwar.   You may experience shorter wait times on Monday and Friday afternoons. The office is located at [address], [address], [place], [place] [address] No appointment is necessary.   Labs are drawn by [facility].  You may receive a bill from [facility] for your lab work. If you have any questions regarding directions or hours of operation,  please call [phone].    

## 2018-06-24 DIAGNOSIS — M79671 Pain in right foot: Secondary | ICD-10-CM | POA: Diagnosis not present

## 2018-06-24 DIAGNOSIS — M792 Neuralgia and neuritis, unspecified: Secondary | ICD-10-CM | POA: Diagnosis not present

## 2018-06-24 DIAGNOSIS — M79672 Pain in left foot: Secondary | ICD-10-CM | POA: Diagnosis not present

## 2018-07-09 DIAGNOSIS — Z6832 Body mass index (BMI) 32.0-32.9, adult: Secondary | ICD-10-CM | POA: Diagnosis not present

## 2018-07-09 DIAGNOSIS — Z1389 Encounter for screening for other disorder: Secondary | ICD-10-CM | POA: Diagnosis not present

## 2018-07-09 DIAGNOSIS — E669 Obesity, unspecified: Secondary | ICD-10-CM | POA: Diagnosis not present

## 2018-07-09 DIAGNOSIS — L509 Urticaria, unspecified: Secondary | ICD-10-CM | POA: Diagnosis not present

## 2018-07-09 DIAGNOSIS — H1013 Acute atopic conjunctivitis, bilateral: Secondary | ICD-10-CM | POA: Diagnosis not present

## 2018-07-10 ENCOUNTER — Other Ambulatory Visit: Payer: Self-pay | Admitting: *Deleted

## 2018-07-10 ENCOUNTER — Other Ambulatory Visit: Payer: Self-pay | Admitting: Cardiovascular Disease

## 2018-07-10 DIAGNOSIS — M0579 Rheumatoid arthritis with rheumatoid factor of multiple sites without organ or systems involvement: Secondary | ICD-10-CM

## 2018-07-10 NOTE — Telephone Encounter (Signed)
Rx(s) sent to pharmacy electronically. MD OV 07/13/18

## 2018-07-13 ENCOUNTER — Encounter: Payer: Self-pay | Admitting: Cardiovascular Disease

## 2018-07-13 ENCOUNTER — Ambulatory Visit (INDEPENDENT_AMBULATORY_CARE_PROVIDER_SITE_OTHER): Payer: Medicare Other | Admitting: Cardiovascular Disease

## 2018-07-13 ENCOUNTER — Ambulatory Visit (HOSPITAL_COMMUNITY)
Admission: RE | Admit: 2018-07-13 | Discharge: 2018-07-13 | Disposition: A | Discharge status: Home / Self Care | Payer: Medicare Other | Source: Ambulatory Visit | Attending: Rheumatology | Admitting: Rheumatology

## 2018-07-13 VITALS — BP 132/80 | HR 64 | Ht 69.0 in | Wt 217.0 lb

## 2018-07-13 DIAGNOSIS — M0579 Rheumatoid arthritis with rheumatoid factor of multiple sites without organ or systems involvement: Secondary | ICD-10-CM | POA: Diagnosis not present

## 2018-07-13 DIAGNOSIS — I50812 Chronic right heart failure: Secondary | ICD-10-CM

## 2018-07-13 DIAGNOSIS — J449 Chronic obstructive pulmonary disease, unspecified: Secondary | ICD-10-CM | POA: Diagnosis not present

## 2018-07-13 DIAGNOSIS — Z7901 Long term (current) use of anticoagulants: Secondary | ICD-10-CM

## 2018-07-13 DIAGNOSIS — I87099 Postthrombotic syndrome with other complications of unspecified lower extremity: Secondary | ICD-10-CM

## 2018-07-13 MED ORDER — TOCILIZUMAB 400 MG/20ML IV SOLN
4.0000 mg/kg | INTRAVENOUS | Status: DC
  Administered 2018-07-13: 384 mg via INTRAVENOUS
  Filled 2018-07-13: qty 19.2

## 2018-07-13 MED ORDER — FUROSEMIDE 40 MG PO TABS: 40.0000 mg | ORAL_TABLET | Freq: Two times a day (BID) | ORAL | 11 refills | Status: DC

## 2018-07-13 MED ORDER — ACETAMINOPHEN 325 MG PO TABS: 650.0000 mg | ORAL_TABLET | ORAL | Status: DC

## 2018-07-13 MED ORDER — DIPHENHYDRAMINE HCL 25 MG PO CAPS: 25.0000 mg | ORAL_CAPSULE | ORAL | Status: DC

## 2018-07-13 NOTE — Patient Instructions (Signed)
Dr Sallyanne Kuster recommends that you schedule a follow-up appointment in 12 months. You will receive a reminder letter in the mail two months in advance. If you don't receive a letter, please call our office to schedule the follow-up appointment.  If you need a refill on your cardiac medications before your next appointment, please call your pharmacy.   Please call the office, [phone], with your potassium dose!

## 2018-07-13 NOTE — Progress Notes (Signed)
Patient ID: [name], male   DOB: 04-13-1951, 67 y.o.   MRN: [id number]    Cardiology Office Note    Date:  07/13/2018   ID:  [name], DOB 23-Oct-1951, MRN [id number]  PCP:  Mark Sites, MD  Cardiologist:   Mark Klein, MD   Chief Complaint  Patient presents with  . Follow-up    12 months  . Shortness of Breath  . Edema    History of Present Illness:  ASHAUN [name] is a 68 y.o. male  History of recurrent right lower extremity deep venous thrombosis and postphlebitic syndrome.   Since his last appointment he has not had any new problems with ulcerations on his lower extremities.  He wears compression bandages on his legs most of the time.  There is always some residual edema, usually worse in the right leg where he is also more prone to develop ulcerations.  He had some issues with dyspnea that improved substantially after he was prescribed inhalers for COPD.  He also completed a series of sessions in pulmonary rehab that had beneficial effect.  Rheumatoid arthritis symptoms are well controlled with Actemra and leflunomide and he does not require steroids at this time.  He is very active.  He has not had any bleeding problems on chronic anticoagulation with warfarin monitored by Dr. Hilma [name].  He denies dyspnea on exertion, rest or with exertion, syncope, palpitations, claudication, neurological deficits.  Past Medical History:  Diagnosis Date  . Anxiety    hx of   . Arthritis   . Asthma   . Clotting disorder (HCC)    DVT both legs   . COPD (chronic obstructive pulmonary disease) ([place])   . DDD (degenerative disc disease), cervical    with UE's paresthesias  . Diverticulosis   . DVT, lower extremity ([place])    bilat  . 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 GERD (gastroesophageal reflux disease)   . Gout   . Heart murmur   . History of measles   . History of shingles   . Hypercholesterolemia    . IBS (irritable bowel syndrome)   . IBS (irritable bowel syndrome)   . Peripheral edema   . Pneumonia    hx of   . Prostate cancer ([place])   . Seizures ([place])    last seizure 20 years ago   . Shortness of breath dyspnea    exertion   . Sleep apnea    not on cpap  . Tubular adenoma of colon 04/2008    Past Surgical History:  Procedure Laterality Date  . CHOLECYSTECTOMY    . COLONOSCOPY    . DOPPLER ECHOCARDIOGRAPHY N/A 01-21-2012   TECHNICALLY DIFFICULT. MILD CONCENTRIC LV HYPERTROPHY. LV CAVITY IS SMALL.. EF=> 55%. TRANSMITRAL SPECTRAL FLOW PATTREN IS SUGGESTIVE OF IMPAIRED LV RELAXATION. RV SYSTOLIC PRESSURE IS 21JHER. LEFT ATRIAL SIZE IS NORMAL. AV APPEARS MILDLY SCLEROTIC. NO SIGN VALVE DISEASE NOTED.  [name] LYMPHADENECTOMY Bilateral 08/30/2015   Procedure: PELVIC LYMPHADENECTOMY;  Surgeon: [name], MD;  Location: [facility];  Service: Urology;  Laterality: Bilateral;  . NUCLEAR STRESS TEST N/A 01-21-2012   NORMAL PATTERN OF PERFUSION IN ALL REGIONS. POST STRESS LV SIZE IS NORMAL. NO EVIDENCE OF INDUCIBLE ISCHEMIA. EF 54%.  [name] POLYPECTOMY    . PROSTATE BIOPSY    . ROBOT ASSISTED LAPAROSCOPIC RADICAL PROSTATECTOMY N/A 08/30/2015   Procedure: ROBOTIC ASSISTED LAPAROSCOPIC RADICAL PROSTATECTOMY;  Surgeon: [name], MD;  Location: [facility];  Service: Urology;  Laterality: N/A;  . US VENOUS LOWER EXT Right 03/07/11   PERSISTENT DVT IN RIGHT LOWER EXT.WITH PERSISTANT VISUALIZATION OF HYPOECHOIC THROMBUS WITHIN THE FEMORAL, PROFUNDA FEMORAL AND POPLITEAL VEINS. WHEN COMPARED TO PREVIOUS, CLOT IS NO LONGER IDENTIFIED WITH IN THE RIGHT CFV.    Current Outpatient Medications  Medication Sig Dispense Refill  . acetaminophen (TYLENOL) 500 MG tablet Take 1,000 mg by mouth every 6 (six) hours as needed (arthritis pain).    [name] albuterol (PROVENTIL) (2.5 MG/3ML) 0.083% nebulizer solution Take 3 mLs (2.5 mg total) by nebulization every 6 (six) hours as needed for wheezing or shortness of  breath. 75 mL 12  . albuterol (PROVENTIL) 2 MG tablet Take 2 mg by mouth daily.     [name] alendronate (FOSAMAX) 70 MG tablet TAKE 1 TABLET BY MOUTH ONCE A WEEK. TAKE WITH FULL GLASS OF WATER ON EMPTY STOMACH 12 tablet 0  . allopurinol (ZYLOPRIM) 100 MG tablet TAKE 1 TABLET BY MOUTH TWICE A DAY 180 tablet 0  . BREO ELLIPTA 100-25 MCG/INH AEPB TAKE 1 PUFF BY MOUTH EVERY DAY 60 each 0  . Calcium-Phosphorus-Vitamin D (CITRACAL +D3 PO) Take 2 tablets by mouth 2 (two) times daily.     [name] dicyclomine (BENTYL) 10 MG capsule TAKE 1 CAPSULE BY MOUTH 4 TIMES DAILY BEFORE MEALS AND AT BEDTIME (Patient taking differently: TAKE 2 CAPSULE BY MOUTH TWO TIMES DAILY) 102 capsule 0  . folic acid (FOLVITE) 1 MG tablet TAKE 2 TABLETS BY MOUTH EVERY MORNING 180 tablet 4  . furosemide (LASIX) 40 MG tablet Take 1 tablet (40 mg total) by mouth 2 (two) times daily. 60 tablet 11  . INCRUSE ELLIPTA 62.5 MCG/INH AEPB INHALE 1 PUFF INTO LUNGS DAILY 30 each 0  . leflunomide (ARAVA) 20 MG tablet Take 1 tablet (20 mg total) by mouth daily. 90 tablet 0  . omeprazole (PRILOSEC) 40 MG capsule Take 40 mg by mouth 2 (two) times daily.   2  . Oxymetazoline HCl (NASAL SPRAY NA) Place 2 sprays into the nose daily as needed (allergies).    . phenytoin (DILANTIN) 100 MG ER capsule Take 100-200 mg by mouth 2 (two) times daily. Take one capsule in the morning and 2 capsules at bedtime    . Tocilizumab (ACTEMRA IV) Inject into the vein every 28 (twenty-eight) days. For Rheumatoid Arthritis    . warfarin (COUMADIN) 3 MG tablet Take 3 mg by mouth at bedtime.      No current facility-administered medications for this visit.    Facility-Administered Medications Ordered in Other Visits  Medication Dose Route Frequency Provider Last Rate Last Dose  . acetaminophen (TYLENOL) tablet 650 mg  650 mg Oral 60 min Pre-Op Bo Merino, MD      . diphenhydrAMINE (BENADRYL) capsule 25 mg  25 mg Oral 60 min Pre-Op Deveshwar, Shaili, MD      . tocilizumab  (ACTEMRA) 4 mg/kg = 384 mg in sodium chloride 0.9 % 100 mL infusion  4 mg/kg Intravenous Q28 days Bo Merino, MD        Allergies:   Orencia [abatacept]; Carbamazepine; Celecoxib; Cephalexin; Enbrel [etanercept]; Humira [adalimumab]; Levofloxacin; Sulfa antibiotics; and Sulfasalazine   Social History   Socioeconomic History  . Marital status: Married    Spouse name: Not on file  . Number of children: 3  . Years of education: Not on file  . Highest education level: Not on file  Occupational History  .  Occupation: retired    Fish farm manager: [employer]  Social Needs  . Financial resource strain: Not on file  . Food insecurity:    Worry: Not on file    Inability: Not on file  . Transportation needs:    Medical: Not on file    Non-medical: Not on file  Tobacco Use  . Smoking status: Former Smoker    Packs/day: 3.00    Years: 20.00    Pack years: 60.00    Types: Cigarettes    Last attempt to quit: 12/30/1989    Years since quitting: 28.5  . Smokeless tobacco: Never Used  Substance and Sexual Activity  . Alcohol use: No    Alcohol/week: 0.0 oz  . Drug use: No  . Sexual activity: Yes  Lifestyle  . Physical activity:    Days per week: Not on file    Minutes per session: Not on file  . Stress: Not on file  Relationships  . Social connections:    Talks on phone: Not on file    Gets together: Not on file    Attends religious service: Not on file    Active member of club or organization: Not on file    Attends meetings of clubs or organizations: Not on file    Relationship status: Not on file  Other Topics Concern  . Not on file  Social History Narrative  . Not on file     Family History:  The patient's family history includes Alzheimer's disease in his mother; Bladder Cancer in his sister; Breast cancer in his sister; Heart attack in his brother, brother, father, and sister; Heart murmur in his sister; Lung cancer in his brother; Skin cancer in his father; Stomach cancer in  his brother and father; Stroke in his sister; Throat cancer in his brother.   ROS:   Please see the history of present illness.    Review of Systems  Musculoskeletal: Positive for arthritis, joint swelling and stiffness.   All other systems are reviewed and are negative  PHYSICAL EXAM:   VS:  BP 132/80 (BP Location: Left Arm, Patient Position: Sitting, Cuff Size: Normal)   Pulse 64   Ht 5\' 9"  (1.753 m)   Wt 217 lb (98.4 kg)   BMI 32.05 kg/m     General: Alert, oriented x3, no distress, mildly obese Head: no evidence of trauma, PERRL,strabismus, no exophtalmos or lid lag, no myxedema, no xanthelasma; normal ears, nose and oropharynx Neck: normal jugular venous pulsations and no hepatojugular reflux; brisk carotid pulses without delay and no carotid bruits Chest: clear to auscultation, no signs of consolidation by percussion or palpation, normal fremitus, symmetrical and full respiratory excursions Cardiovascular: normal position and quality of the apical impulse, regular rhythm, normal first and second heart sounds, no murmurs, rubs or gallops Abdomen: no tenderness or distention, no masses by palpation, no abnormal pulsatility or arterial bruits, normal bowel sounds, no hepatosplenomegaly Extremities: no clubbing, cyanosis; 2+ pitting edema right calf, 1+ pitting edema left ankle; 2+ radial, ulnar and brachial pulses bilaterally; 2+ right femoral, posterior tibial and dorsalis pedis pulses; 2+ left femoral, posterior tibial and dorsalis pedis pulses; no subclavian or femoral bruits Neurological: grossly nonfocal Psych: Normal mood and affect  Wt Readings from Last 3 Encounters:  07/13/18 212 lb (96.2 kg)  07/13/18 217 lb (98.4 kg)  06/22/18 211 lb 12.8 oz (96.1 kg)    Studies/Labs Reviewed:   EKG:  EKG is ordered today.  It shows normal sinus rhythm with  early RS transition in lead V2, no repolarization normalities, QTC 418 ms  Recent Labs: 06/15/2018: ALT 22; BUN 10; Creatinine,  Ser 1.11; Hemoglobin 15.2; Platelets 189; Potassium 3.5; Sodium 142   ASSESSMENT:    1. Chronic right-sided congestive heart failure ([place])   2. Complicated postphlebitic syndrome   3. Long term (current) use of anticoagulants   4. Chronic obstructive pulmonary disease, unspecified COPD type ([place])   5. Rheumatoid arthritis involving multiple [name] with positive rheumatoid factor (HCC)     PLAN:  In order of problems listed above:  1. Venous insufficiency: is due to a combination of peripheral venous insufficiency and postphlebitic syndrome and has been complicated by venous stasis ulcers in the past.  Edema is currently well controlled and he has not had recent skin lesions. 2. RHF: Edema is compounded by some degree of cor pulmonale related to COPD.  Responds well to diuretics. 3. Warfarin: Lifelong anticoagulation for history of recurrent DVT complicated by postphlebitic syndrome.  INR monitored and Dr. Delanna Ahmadi clinic.  No bleeding or embolic complications. 4. COPD: He is doing well on his current inhalers.  He understands that he should not use the long-acting bronchodilator (Breo) more than once daily. 5. RA: No longer on steroids.  Medication Adjustments/Labs and Tests Ordered: Current medicines are reviewed at length with the patient today.  Concerns regarding medicines are outlined above.  Medication changes, Labs and Tests ordered today are listed below. Patient Instructions  Dr Sallyanne Kuster recommends that you schedule a follow-up appointment in 12 months. You will receive a reminder letter in the mail two months in advance. If you don't receive a letter, please call our office to schedule the follow-up appointment.  If you need a refill on your cardiac medications before your next appointment, please call your pharmacy.   Please call the office, [phone], with your potassium dose!     Signed, Mark Klein, MD  07/13/2018 10:57 AM    [facility], [place], [place]  [address] Phone: [phone]; Fax: [phone]

## 2018-07-19 ENCOUNTER — Other Ambulatory Visit: Payer: Self-pay | Admitting: Internal Medicine

## 2018-07-21 ENCOUNTER — Other Ambulatory Visit: Payer: Self-pay | Admitting: *Deleted

## 2018-07-21 DIAGNOSIS — H1045 Other chronic allergic conjunctivitis: Secondary | ICD-10-CM | POA: Diagnosis not present

## 2018-07-21 DIAGNOSIS — H53021 Refractive amblyopia, right eye: Secondary | ICD-10-CM | POA: Diagnosis not present

## 2018-07-21 DIAGNOSIS — H04123 Dry eye syndrome of bilateral lacrimal glands: Secondary | ICD-10-CM | POA: Diagnosis not present

## 2018-07-21 DIAGNOSIS — H16223 Keratoconjunctivitis sicca, not specified as Sjogren's, bilateral: Secondary | ICD-10-CM | POA: Diagnosis not present

## 2018-07-21 NOTE — Progress Notes (Signed)
Infusion orders are current for patient CBC CMP Tylenol Benadryl appointments are up to date and follow up appointment  is scheduled TB gold not due yet.  

## 2018-07-22 DIAGNOSIS — M79671 Pain in right foot: Secondary | ICD-10-CM | POA: Diagnosis not present

## 2018-07-22 DIAGNOSIS — B351 Tinea unguium: Secondary | ICD-10-CM | POA: Diagnosis not present

## 2018-07-22 DIAGNOSIS — G5790 Unspecified mononeuropathy of unspecified lower limb: Secondary | ICD-10-CM | POA: Diagnosis not present

## 2018-07-22 DIAGNOSIS — G600 Hereditary motor and sensory neuropathy: Secondary | ICD-10-CM | POA: Diagnosis not present

## 2018-08-07 ENCOUNTER — Telehealth: Payer: Self-pay | Admitting: Internal Medicine

## 2018-08-07 MED ORDER — FLUTICASONE FUROATE-VILANTEROL 100-25 MCG/INH IN AEPB: INHALATION_SPRAY | RESPIRATORY_TRACT | 0 refills | Status: DC

## 2018-08-10 ENCOUNTER — Ambulatory Visit (HOSPITAL_COMMUNITY)
Admission: RE | Admit: 2018-08-10 | Discharge: 2018-08-10 | Disposition: A | Discharge status: Home / Self Care | Payer: Medicare Other | Source: Ambulatory Visit | Attending: Rheumatology | Admitting: Rheumatology

## 2018-08-10 DIAGNOSIS — M0579 Rheumatoid arthritis with rheumatoid factor of multiple sites without organ or systems involvement: Secondary | ICD-10-CM | POA: Diagnosis not present

## 2018-08-10 LAB — COMPREHENSIVE METABOLIC PANEL
ALT: 25 U/L (ref 0–44)
AST: 44 U/L — ABNORMAL HIGH (ref 15–41)
Albumin: 3.9 g/dL (ref 3.5–5.0)
Alkaline Phosphatase: 67 U/L (ref 38–126)
Anion gap: 9 (ref 5–15)
BUN: 15 mg/dL (ref 8–23)
CO2: 33 mmol/L — ABNORMAL HIGH (ref 22–32)
Calcium: 9.3 mg/dL (ref 8.9–10.3)
Chloride: 98 mmol/L (ref 98–111)
Creatinine, Ser: 1.18 mg/dL (ref 0.61–1.24)
GFR calc Af Amer: >60 mL/min (ref >60)
GFR calc non Af Amer: >60 mL/min (ref >60)
Glucose, Bld: 86 mg/dL (ref 70–99)
Potassium: 4.5 mmol/L (ref 3.5–5.1)
Sodium: 140 mmol/L (ref 135–145)
Total Bilirubin: 1.3 mg/dL — ABNORMAL HIGH (ref 0.3–1.2)
Total Protein: 6.3 g/dL — ABNORMAL LOW (ref 6.5–8.1)

## 2018-08-10 LAB — CBC
HCT: 44.9 % (ref 39.0–52.0)
Hemoglobin: 14.7 g/dL (ref 13.0–17.0)
MCH: 30 pg (ref 26.0–34.0)
MCHC: 32.7 g/dL (ref 30.0–36.0)
MCV: 91.6 fL (ref 78.0–100.0)
Platelets: 184 10*3/uL (ref 150–400)
RBC: 4.9 MIL/uL (ref 4.22–5.81)
RDW: 13.7 % (ref 11.5–15.5)
WBC: 6.8 10*3/uL (ref 4.0–10.5)

## 2018-08-10 MED ORDER — ACETAMINOPHEN 325 MG PO TABS: 650.0000 mg | ORAL_TABLET | ORAL | Status: DC

## 2018-08-10 MED ORDER — DIPHENHYDRAMINE HCL 25 MG PO CAPS: 25.0000 mg | ORAL_CAPSULE | ORAL | Status: DC

## 2018-08-10 MED ORDER — SODIUM CHLORIDE 0.9 % IV SOLN
4.0000 mg/kg | INTRAVENOUS | Status: DC
  Administered 2018-08-10: 384 mg via INTRAVENOUS
  Filled 2018-08-10: qty 19.2

## 2018-08-10 NOTE — Progress Notes (Signed)
AST is mildly elevated.  We will continue to monitor.  Please advise patient to avoid NSAIDs and alcohol.

## 2018-08-11 NOTE — Telephone Encounter (Signed)
Attempted to call patient today regarding rx breo refills. I did not receive an answer at time of call. I have left a voicemail message for pt to return call. X1

## 2018-08-12 MED ORDER — FLUTICASONE FUROATE-VILANTEROL 100-25 MCG/INH IN AEPB: INHALATION_SPRAY | RESPIRATORY_TRACT | 0 refills | Status: DC

## 2018-08-12 MED ORDER — UMECLIDINIUM BROMIDE 62.5 MCG/INH IN AEPB: 1.0000 | INHALATION_SPRAY | Freq: Every day | RESPIRATORY_TRACT | 2 refills | Status: DC

## 2018-08-12 NOTE — Telephone Encounter (Signed)
Pt is returning call. Cb is [phone].

## 2018-08-12 NOTE — Telephone Encounter (Signed)
Spoke with pt. He is needing samples of Breo 100 and Incruse. Advised him that we have samples of Breo but not Incruse at this time. Samples of Breo have been left up front. A prescription for Incruse has been sent in per the pt's request. Nothing further was needed at this time.

## 2018-08-25 ENCOUNTER — Other Ambulatory Visit: Payer: Self-pay | Admitting: *Deleted

## 2018-08-25 NOTE — Progress Notes (Signed)
Infusion orders are current for patient CBC CMP Tylenol Benadryl appointments are up to date and follow up appointment  is scheduled TB gold not due yet.  

## 2018-09-03 ENCOUNTER — Other Ambulatory Visit: Payer: Self-pay | Admitting: Rheumatology

## 2018-09-03 NOTE — Telephone Encounter (Signed)
Last visit: 06/22/2018 Next visit: 11/16/2018  Labs: 08/10/2018 AST is mildly elevated. We will continue to monitor.   Okay to refill per Dr. Estanislado Pandy.

## 2018-09-07 ENCOUNTER — Encounter (HOSPITAL_COMMUNITY)
Admission: RE | Admit: 2018-09-07 | Discharge: 2018-09-07 | Disposition: A | Discharge status: Home / Self Care | Payer: Medicare Other | Source: Ambulatory Visit | Attending: Rheumatology | Admitting: Rheumatology

## 2018-09-07 DIAGNOSIS — M0579 Rheumatoid arthritis with rheumatoid factor of multiple sites without organ or systems involvement: Secondary | ICD-10-CM | POA: Diagnosis not present

## 2018-09-07 MED ORDER — DIPHENHYDRAMINE HCL 25 MG PO CAPS: 25.0000 mg | ORAL_CAPSULE | ORAL | Status: DC

## 2018-09-07 MED ORDER — TOCILIZUMAB 400 MG/20ML IV SOLN
4.0000 mg/kg | INTRAVENOUS | Status: DC
  Administered 2018-09-07: 384 mg via INTRAVENOUS
  Filled 2018-09-07 (×2): qty 19.2

## 2018-09-07 MED ORDER — ACETAMINOPHEN 325 MG PO TABS: 650.0000 mg | ORAL_TABLET | ORAL | Status: DC

## 2018-09-11 ENCOUNTER — Other Ambulatory Visit: Payer: Self-pay | Admitting: *Deleted

## 2018-09-11 DIAGNOSIS — M0579 Rheumatoid arthritis with rheumatoid factor of multiple sites without organ or systems involvement: Secondary | ICD-10-CM

## 2018-09-28 ENCOUNTER — Other Ambulatory Visit: Payer: Self-pay | Admitting: Physician Assistant

## 2018-09-28 MED ORDER — LEFLUNOMIDE 20 MG PO TABS: 20.0000 mg | ORAL_TABLET | Freq: Every day | ORAL | 0 refills | Status: DC

## 2018-09-28 NOTE — Telephone Encounter (Signed)
Last visit: 06/22/2018 Next visit: 11/16/2018  Labs: 08/10/2018 AST is mildly elevated. We will continue to monitor.   Okay to refill per Dr. Estanislado Pandy.

## 2018-09-28 NOTE — Addendum Note (Signed)
Addended by: Carole Binning on: 09/28/2018 12:43 PM   Modules accepted: Orders

## 2018-10-05 ENCOUNTER — Telehealth: Payer: Self-pay | Admitting: Internal Medicine

## 2018-10-05 ENCOUNTER — Encounter (HOSPITAL_COMMUNITY)
Admission: RE | Admit: 2018-10-05 | Discharge: 2018-10-05 | Disposition: A | Discharge status: Home / Self Care | Payer: Medicare Other | Source: Ambulatory Visit | Attending: Rheumatology | Admitting: Rheumatology

## 2018-10-05 DIAGNOSIS — Z23 Encounter for immunization: Secondary | ICD-10-CM | POA: Diagnosis not present

## 2018-10-05 DIAGNOSIS — M0579 Rheumatoid arthritis with rheumatoid factor of multiple sites without organ or systems involvement: Secondary | ICD-10-CM | POA: Diagnosis not present

## 2018-10-05 LAB — CBC
HCT: 44.9 % (ref 39.0–52.0)
Hemoglobin: 14.7 g/dL (ref 13.0–17.0)
MCH: 30.5 pg (ref 26.0–34.0)
MCHC: 32.7 g/dL (ref 30.0–36.0)
MCV: 93.2 fL (ref 78.0–100.0)
Platelets: 167 10*3/uL (ref 150–400)
RBC: 4.82 MIL/uL (ref 4.22–5.81)
RDW: 13.7 % (ref 11.5–15.5)
WBC: 6.3 10*3/uL (ref 4.0–10.5)

## 2018-10-05 LAB — COMPREHENSIVE METABOLIC PANEL
ALT: 24 U/L (ref 0–44)
AST: 30 U/L (ref 15–41)
Albumin: 3.8 g/dL (ref 3.5–5.0)
Alkaline Phosphatase: 52 U/L (ref 38–126)
Anion gap: 11 (ref 5–15)
BUN: 11 mg/dL (ref 8–23)
CO2: 31 mmol/L (ref 22–32)
Calcium: 9 mg/dL (ref 8.9–10.3)
Chloride: 99 mmol/L (ref 98–111)
Creatinine, Ser: 1.15 mg/dL (ref 0.61–1.24)
GFR calc Af Amer: >60 mL/min (ref >60)
GFR calc non Af Amer: >60 mL/min (ref >60)
Glucose, Bld: 129 mg/dL — ABNORMAL HIGH (ref 70–99)
Potassium: 3.6 mmol/L (ref 3.5–5.1)
Sodium: 141 mmol/L (ref 135–145)
Total Bilirubin: 0.9 mg/dL (ref 0.3–1.2)
Total Protein: 6.4 g/dL — ABNORMAL LOW (ref 6.5–8.1)

## 2018-10-05 MED ORDER — TOCILIZUMAB 400 MG/20ML IV SOLN
4.0000 mg/kg | INTRAVENOUS | Status: DC
  Administered 2018-10-05: 400 mg via INTRAVENOUS
  Filled 2018-10-05: qty 20

## 2018-10-05 MED ORDER — DIPHENHYDRAMINE HCL 25 MG PO CAPS: 25.0000 mg | ORAL_CAPSULE | ORAL | Status: DC

## 2018-10-05 MED ORDER — ACETAMINOPHEN 325 MG PO TABS: 650.0000 mg | ORAL_TABLET | ORAL | Status: DC

## 2018-10-05 MED ORDER — FLUTICASONE FUROATE-VILANTEROL 100-25 MCG/INH IN AEPB: INHALATION_SPRAY | RESPIRATORY_TRACT | 0 refills | Status: DC

## 2018-10-05 NOTE — Progress Notes (Signed)
stable °

## 2018-10-05 NOTE — Telephone Encounter (Signed)
Called and left message informing patient that the sample of breo has been placed upfront as well as a patient assistance application as well a coupon. Nothing further is needed at this time. Sample placed upfront for pick up.

## 2018-10-05 NOTE — Telephone Encounter (Signed)
Attempted to call pt. I did not receive an answer. I have left a message for pt to return our call.  

## 2018-10-07 DIAGNOSIS — Z79899 Other long term (current) drug therapy: Secondary | ICD-10-CM | POA: Diagnosis not present

## 2018-10-07 DIAGNOSIS — I82409 Acute embolism and thrombosis of unspecified deep veins of unspecified lower extremity: Secondary | ICD-10-CM | POA: Diagnosis not present

## 2018-10-08 NOTE — Telephone Encounter (Signed)
Spoke with patient, he stated that he has already received the Breo samples from earlier. Nothing further needed at time of call.

## 2018-10-14 ENCOUNTER — Telehealth: Payer: Self-pay | Admitting: Rheumatology

## 2018-10-14 MED ORDER — PREDNISONE 5 MG PO TABS: ORAL_TABLET | ORAL | 0 refills | Status: DC

## 2018-10-14 NOTE — Telephone Encounter (Signed)
Does he continue to take Arava 20 mg by mouth daily? Ok to provide prednisone taper starting at 20 mg and taper by 5 mg every 2 days.  Please advise patient to schedule an appointment next Monday or Wednesday when Dr. Estanislado Pandy is back in the office.  He had synovitis at his last visit, and we may need to discuss other treatment options if he continues to have frequent flares.

## 2018-10-14 NOTE — Telephone Encounter (Signed)
Patient states he is having pain when using bilateral hands. Patient states he is also having pain in bilateral hips. Patient states he has been using Voltaren Gel with some relief. Patient states the pain has been going on for about 3-4 weeks. Patient is only able to make appointment on Mondays and Wednesdays due to transportation. Please advise.

## 2018-10-14 NOTE — Telephone Encounter (Signed)
Patient called stating he is having pain in his hands and requesting prescription of Prednisone to be sent to [facility] in [place].

## 2018-10-14 NOTE — Telephone Encounter (Signed)
Patient states he is taking [place]. Patient has been scheduled for a follow up on 10/21/18. Prescription sent to the pharmacy.

## 2018-10-19 ENCOUNTER — Other Ambulatory Visit: Payer: Self-pay | Admitting: *Deleted

## 2018-10-19 NOTE — Progress Notes (Signed)
Office Visit Note  Patient: [name]             Date of Birth: Dec 21, 1951           MRN: [id number]             PCP: [name], MD Referring: [name], MD Visit Date: 10/21/2018 Occupation: @GUAROCC @  Subjective:  Bilateral hand pain   History of Present Illness: DANTE ROUDEBUSH is a 67 y.o. male with history of seropositive rheumatoid arthritis, gout, osteoarthritis, and osteoporosis.  Patient is on Actemra IV infusions and Arava 20 mg by mouth daily.  He is taking allopurinol 200 mg by mouth daily for management of gout.  He is also on Fosamax 70 mg by mouth once weekly for management of osteoporosis.  Patient reports he has been having a flare for about 4 weeks.  He states he tried using Voltaren gel with minimal relief.  He was started on a prednisone taper on 10/14/2018 and has 1 more dose of 5 mg to take.  He reports the joint swelling has improved but he continues to have swelling and pain in both hands.  He states he has also notices some nodules appear on his hands. He denies any recent gout flares. He has neuropathy in both feet but is apprehensive to start on gabapentin due to potential side effects. He continues to have chronic neck and lower back pain.  Activities of Daily Living:  Patient reports morning stiffness all day.   Patient Reports nocturnal pain.  Difficulty dressing/grooming: Denies Difficulty climbing stairs: Denies Difficulty getting out of chair: Denies Difficulty using hands for taps, buttons, cutlery, and/or writing: Reports  Review of Systems  Constitutional: Negative for fatigue and night sweats.  HENT: Negative for mouth sores, trouble swallowing, trouble swallowing, mouth dryness and nose dryness.   Eyes: Negative for pain, redness, itching and dryness.  Respiratory: Positive for wheezing. Negative for cough, hemoptysis, shortness of breath and difficulty breathing.        Due to COPD  Cardiovascular: Negative for chest pain,  palpitations, hypertension, irregular heartbeat and swelling in legs/feet.  Gastrointestinal: Negative for abdominal pain, blood in stool, constipation, diarrhea, nausea and vomiting.  Endocrine: Negative for increased urination.  Genitourinary: Negative for painful urination and urgency.  Musculoskeletal: Positive for arthralgias, joint pain, joint swelling and morning stiffness. Negative for myalgias, muscle weakness, muscle tenderness and myalgias.  Skin: Negative for color change, rash, hair loss, nodules/bumps, skin tightness, ulcers and sensitivity to sunlight.  Allergic/Immunologic: Negative for susceptible to infections.  Neurological: Positive for memory loss. Negative for dizziness, fainting, light-headedness, headaches, night sweats and weakness.  Hematological: Negative for swollen glands.  Psychiatric/Behavioral: Negative for depressed mood, confusion and sleep disturbance. The patient is not nervous/anxious.     PMFS History:  Patient Active Problem List   Diagnosis Date Noted  . Dyspnea on exertion 10/22/2017  . History of seizure disorder 02/27/2017  . Primary osteoarthritis of both hands 02/27/2017  . Primary osteoarthritis of both feet 02/27/2017  . Idiopathic gout of multiple sites 02/27/2017  . High risk medication use 12/29/2016  . History of prostate cancer 12/29/2016  . Primary osteoarthritis of both knees 12/29/2016  . Chronic idiopathic gout involving toe without tophus 12/29/2016  . History of CHF (congestive heart failure) 12/29/2016  . History of COPD 12/29/2016  . Plantar pustular psoriasis 12/29/2016  . DVT (deep venous thrombosis) ([place]) 10/31/2016  . Chronic obstructive pulmonary disease ([place]) 10/31/2016  . CHF (  congestive heart failure) ([place]) 10/31/2016  . Prostate cancer ([place]) 08/30/2015  . Malignant neoplasm of prostate ([place]) 07/04/2015  . Chest pain at rest 07/05/2014  . Weakness 07/05/2014  . Complicated postphlebitic syndrome 11/16/2013  .  Personal history of DVT (deep vein thrombosis) 05/18/2013  . Chronic anticoagulation 05/18/2013  . Diverticulosis of colon without hemorrhage 05/18/2013  . Rheumatoid arthritis ([place]) 05/18/2013  . Seizure disorder ([place]) 05/18/2013  . Hx of adenomatous colonic polyps 05/18/2013  . GERD 05/08/2010  . NAUSEA 05/08/2010  . FLATULENCE-GAS-BLOATING 05/08/2010    Past Medical History:  Diagnosis Date  . Anxiety    hx of   . Arthritis   . Asthma   . Clotting disorder (HCC)    DVT both legs   . COPD (chronic obstructive pulmonary disease) ([place])   . DDD (degenerative disc disease), cervical    with UE's paresthesias  . Diverticulosis   . DVT, lower extremity ([place])    bilat  . 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 GERD (gastroesophageal reflux disease)   . Gout   . Heart murmur   . History of measles   . History of shingles   . Hypercholesterolemia   . IBS (irritable bowel syndrome)   . IBS (irritable bowel syndrome)   . Peripheral edema   . Pneumonia    hx of   . Prostate cancer ([place])   . Seizures ([place])    last seizure 20 years ago   . Shortness of breath dyspnea    exertion   . Sleep apnea    not on cpap  . Tubular adenoma of colon 04/2008    Family History  Problem Relation Age of Onset  . Skin cancer Father   . Stomach cancer Father   . Heart attack Father   . Alzheimer's disease Mother   . Throat cancer Brother   . Stomach cancer Brother   . Lung cancer Brother   . Heart attack Brother   . Heart attack Brother   . Breast cancer Sister   . Heart attack Sister   . Stroke Sister   . Bladder Cancer Sister   . Heart murmur Sister   . Colon cancer Brother   . Colon polyps Neg Hx    Past Surgical History:  Procedure Laterality Date  . CHOLECYSTECTOMY    . COLONOSCOPY    . DOPPLER ECHOCARDIOGRAPHY N/A 01-21-2012   TECHNICALLY DIFFICULT. MILD CONCENTRIC LV HYPERTROPHY. LV CAVITY IS SMALL.. EF=> 55%.  TRANSMITRAL SPECTRAL FLOW PATTREN IS SUGGESTIVE OF IMPAIRED LV RELAXATION. RV SYSTOLIC PRESSURE IS 26STMH. LEFT ATRIAL SIZE IS NORMAL. AV APPEARS MILDLY SCLEROTIC. NO SIGN VALVE DISEASE NOTED.  [name] LYMPHADENECTOMY Bilateral 08/30/2015   Procedure: PELVIC LYMPHADENECTOMY;  Surgeon: [name], MD;  Location: [facility];  Service: Urology;  Laterality: Bilateral;  . NUCLEAR STRESS TEST N/A 01-21-2012   NORMAL PATTERN OF PERFUSION IN ALL REGIONS. POST STRESS LV SIZE IS NORMAL. NO EVIDENCE OF INDUCIBLE ISCHEMIA. EF 54%.  [name] POLYPECTOMY    . PROSTATE BIOPSY    . ROBOT ASSISTED LAPAROSCOPIC RADICAL PROSTATECTOMY N/A 08/30/2015   Procedure: ROBOTIC ASSISTED LAPAROSCOPIC RADICAL PROSTATECTOMY;  Surgeon: [name], MD;  Location: [facility];  Service: Urology;  Laterality: N/A;  . US VENOUS LOWER EXT Right 03/07/11   PERSISTENT DVT IN RIGHT LOWER EXT.WITH PERSISTANT VISUALIZATION OF HYPOECHOIC THROMBUS WITHIN THE FEMORAL, PROFUNDA FEMORAL AND POPLITEAL VEINS. WHEN COMPARED  TO PREVIOUS, CLOT IS NO LONGER IDENTIFIED WITH IN THE RIGHT CFV.   Social History   Social History Narrative  . Not on file   Immunization History  Administered Date(s) Administered  . Influenza, High Dose Seasonal PF 10/05/2018  . Influenza,inj,Quad PF,6+ Mos 08/31/2015  . Influenza-Unspecified 09/30/2016, 08/22/2017  . Pneumococcal Conjugate-13 10/05/2018  . Pneumococcal-Unspecified 12/30/2012   Objective: Vital Signs: BP (!) 147/87 (BP Location: Left Arm, Patient Position: Sitting, Cuff Size: Normal)   Pulse 72   Resp 15   Ht 5\' 9"  (1.753 m)   Wt 222 lb 6.4 oz (100.9 kg)   BMI 32.84 kg/m    Physical Exam  Constitutional: He is oriented to person, place, and time. He appears well-developed and well-nourished.  HENT:  Head: Normocephalic and atraumatic.  Eyes: Pupils are equal, round, and reactive to light. Conjunctivae and EOM are normal.  Neck: Normal range of motion. Neck supple.  Cardiovascular: Normal rate,  regular rhythm and normal heart sounds.  Pulmonary/Chest: Effort normal and breath sounds normal.  Abdominal: Soft. Bowel sounds are normal.  Lymphadenopathy:    He has no cervical adenopathy.  Neurological: He is alert and oriented to person, place, and time.  Skin: Skin is warm and dry. Capillary refill takes less than 2 seconds.  Psychiatric: He has a normal mood and affect. His behavior is normal.  Nursing note and vitals reviewed.    Musculoskeletal Exam: C-spine limited ROM with crepitus and discomfort.  Thoracic kyphosis.  Limited ROM of lumbar spine. No midline spinal tenderness.  No SI joint tenderness.  Shoulder joints and elbow joints good range of motion with no discomfort.  Slightly limited range of motion of right wrist.  He has synovitis of the right wrist.  Synovitis as described below.  He has PIP and DIP synovial thickening consistent with osteoarthritis of bilateral hands.  He since is present on the right second DIP.  Hip joints, knee joints, ankle joints, MTPs, PIPs, DIPs good range of motion no synovitis.  Synovial thickening of bilateral ankle joints.  He has pedal edema bilaterally.  No warmth or effusion bilateral knee joints.  He has tenderness of bilateral trochanteric bursa.  CDAI Exam: CDAI Score: 15.2  Patient Global Assessment: 6 (mm); Provider Global Assessment: 6 (mm) Swollen: 7 ; Tender: 7  Joint Exam      Right  Left  Wrist  Swollen Tender     MCP 1  Swollen Tender  Swollen Tender  MCP 2  Swollen Tender  Swollen Tender  MCP 5  Swollen Tender  Swollen Tender     Investigation: No additional findings.  Imaging: No results found.  Recent Labs: Lab Results  Component Value Date   WBC 6.3 10/05/2018   HGB 14.7 10/05/2018   PLT 167 10/05/2018   NA 141 10/05/2018   K 3.6 10/05/2018   CL 99 10/05/2018   CO2 31 10/05/2018   GLUCOSE 129 (H) 10/05/2018   BUN 11 10/05/2018   CREATININE 1.15 10/05/2018   BILITOT 0.9 10/05/2018   ALKPHOS 52  10/05/2018   AST 30 10/05/2018   ALT 24 10/05/2018   PROT 6.4 (L) 10/05/2018   ALBUMIN 3.8 10/05/2018   CALCIUM 9.0 10/05/2018   GFRAA >60 10/05/2018   QFTBGOLDPLUS Indeterminate 01/26/2018    Speciality Comments: ACTEMRA 4mg /kg x 4 weeks PPD negative 01/02/17  Procedures:  No procedures performed Allergies: Orencia [abatacept]; Carbamazepine; Celecoxib; Cephalexin; Enbrel [etanercept]; Humira [adalimumab]; Levofloxacin; Sulfa antibiotics; and Sulfasalazine   Assessment /  Plan:     Visit Diagnoses: Rheumatoid arthritis involving multiple sites with positive rheumatoid factor ([place]): He continues to have active synovitis as described above.  He has been flaring for the past 4 weeks in bilateral hands. His last Actemra IV infusion was on 10/05/2018.  He continues take Arava 20 mg by mouth daily.  He was started on a prednisone taper starting at 20 mg tapering by 5 mg every 2 days starting on 10/14/2018.  Despite still being on low-dose prednisone he has synovitis on exam today.  He is very limited in the medication options.  He is unable to take anti-TNF's due to history of CHF.  He had an anaphylactic reaction to Orencia in the past.  He cannot be started on a Jak inhibitor due to history of DVT.  He cannot be started on Plaquenil or sulfasalazine due to having a sulfa allergy.  We are apprehensive to start him on Imuran due to the immunosuppressive side effects while on Actemra and Arava.  One option in the future may be to switch from Actemra to [place]. He will continue on Actemra IV infusions and Arava 20 mg by mouth daily.  He does not need any refills at this time.  He will continue on Prednisone 5 mg by mouth daily.  He has been using voltaren gel topically several times per day.  He has been tolerating it well and finds it to provide temporary relief.  He requested a refill today. He will follow up in the office in 3 months.  He was advised to notify us if he continues to flare.   High risk  medication use - Actemra IV (last dose 10/05/18), Arava 20 mg, Tb gold negative 01/26/18.  CBC and CMP were drawn on 10/05/2018.  He has lab work is performed with his infusions.  Future order for uric acid was placed today.  Chronic idiopathic gout involving toe without tophus, unspecified laterality -he has not had any recent gout flares.  He continues take allopurinol 200 mg by mouth daily.  A future order for uric acid was placed today.  He does not need any refills of allopurinol at this time. - Plan: Uric acid  Primary osteoarthritis of both hands: He has PIP and DIP synovial thickening consistent with osteoarthritis of bilateral hands.  Joint protection and muscle strengthening were discussed.  Primary osteoarthritis of both knees: No warmth or effusion.  Good range of motion with no discomfort.  Primary osteoarthritis of both feet: He has osteoarthritic changes in bilateral feet.  He has no discomfort in his feet at this time.  Age-related osteoporosis without current pathological fracture -He is on Fosamax 70mg  weekly (started 09/29/17). 09/24/2017 DXA T score -2.60, BMD 0.571 right femoral neck.  Other medical conditions are listed as follows:  History of prostate cancer  History of COPD  History of CHF (congestive heart failure)  History of DVT (deep vein thrombosis)  History of adenomatous polyp of colon  History of obesity  History of seizure disorder     Orders: Orders Placed This Encounter  Procedures  . Uric acid   Meds ordered this encounter  Medications  . predniSONE (DELTASONE) 5 MG tablet    Sig: Take 1 tablet (5 mg total) by mouth daily with breakfast.    Dispense:  30 tablet    Refill:  2  . diclofenac sodium (VOLTAREN) 1 % GEL    Sig: Apply 3 grams to 3 large joints up to 3 times daily  Dispense:  3 Tube    Refill:  3    Face-to-face time spent with patient was 30 minutes. Greater than 50% of time was spent in counseling and coordination of  care.  Follow-Up Instructions: Return in about 3 months (around 01/21/2019) for Rheumatoid arthritis, Gout, Osteoarthritis, Osteoporosis.   Ofilia Neas, PA-C   I examined and evaluated the patient with Hazel Sams PA.  Patient initially had good response to IV Actemra and Arava combination.  Now has been having frequent flares.  He is on prednisone taper currently.  Patient is still has significant synovitis on my examination despite being on prednisone taper.  I detailed discussion with patient and his wife today.  We decided to keep him on low-dose prednisone at 5 mg p.o. daily.  Due to his multiple medical problems he does not have very many treatment options.  If he does not respond to low-dose prednisone we may consider switching him from Actemra IV to [place].  The plan of care was discussed as noted above.  Bo Merino, MD  Note - This record has been created using Editor, commissioning.  Chart creation errors have been sought, but may not always  have been located. Such creation errors do not reflect on  the standard of medical care.

## 2018-10-19 NOTE — Progress Notes (Signed)
Infusion orders are current for patient CBC CMP Tylenol Benadryl appointments are up to date and follow up appointment  is scheduled TB gold not due yet.  

## 2018-10-21 ENCOUNTER — Telehealth: Payer: Self-pay | Admitting: *Deleted

## 2018-10-21 ENCOUNTER — Encounter: Payer: Self-pay | Admitting: Physician Assistant

## 2018-10-21 ENCOUNTER — Ambulatory Visit (INDEPENDENT_AMBULATORY_CARE_PROVIDER_SITE_OTHER): Payer: Medicare Other | Admitting: Rheumatology

## 2018-10-21 VITALS — BP 147/87 | HR 72 | Resp 15 | Ht 69.0 in | Wt 222.4 lb

## 2018-10-21 DIAGNOSIS — M19041 Primary osteoarthritis, right hand: Secondary | ICD-10-CM

## 2018-10-21 DIAGNOSIS — M19071 Primary osteoarthritis, right ankle and foot: Secondary | ICD-10-CM | POA: Diagnosis not present

## 2018-10-21 DIAGNOSIS — M1A0790 Idiopathic chronic gout, unspecified ankle and foot, without tophus (tophi): Secondary | ICD-10-CM | POA: Diagnosis not present

## 2018-10-21 DIAGNOSIS — Z86718 Personal history of other venous thrombosis and embolism: Secondary | ICD-10-CM | POA: Diagnosis not present

## 2018-10-21 DIAGNOSIS — Z79899 Other long term (current) drug therapy: Secondary | ICD-10-CM

## 2018-10-21 DIAGNOSIS — M810 Age-related osteoporosis without current pathological fracture: Secondary | ICD-10-CM | POA: Diagnosis not present

## 2018-10-21 DIAGNOSIS — Z8639 Personal history of other endocrine, nutritional and metabolic disease: Secondary | ICD-10-CM

## 2018-10-21 DIAGNOSIS — M170 Bilateral primary osteoarthritis of knee: Secondary | ICD-10-CM

## 2018-10-21 DIAGNOSIS — Z8546 Personal history of malignant neoplasm of prostate: Secondary | ICD-10-CM | POA: Diagnosis not present

## 2018-10-21 DIAGNOSIS — M19072 Primary osteoarthritis, left ankle and foot: Secondary | ICD-10-CM

## 2018-10-21 DIAGNOSIS — M0579 Rheumatoid arthritis with rheumatoid factor of multiple sites without organ or systems involvement: Secondary | ICD-10-CM

## 2018-10-21 DIAGNOSIS — Z8679 Personal history of other diseases of the circulatory system: Secondary | ICD-10-CM | POA: Diagnosis not present

## 2018-10-21 DIAGNOSIS — Z86010 Personal history of colonic polyps: Secondary | ICD-10-CM

## 2018-10-21 DIAGNOSIS — Z8669 Personal history of other diseases of the nervous system and sense organs: Secondary | ICD-10-CM

## 2018-10-21 DIAGNOSIS — I82409 Acute embolism and thrombosis of unspecified deep veins of unspecified lower extremity: Secondary | ICD-10-CM | POA: Diagnosis not present

## 2018-10-21 DIAGNOSIS — Z8709 Personal history of other diseases of the respiratory system: Secondary | ICD-10-CM

## 2018-10-21 DIAGNOSIS — M19042 Primary osteoarthritis, left hand: Secondary | ICD-10-CM

## 2018-10-21 MED ORDER — DICLOFENAC SODIUM 1 % TD GEL: TRANSDERMAL | 3 refills | Status: DC

## 2018-10-21 MED ORDER — PREDNISONE 5 MG PO TABS: 5.0000 mg | ORAL_TABLET | Freq: Every day | ORAL | 2 refills | Status: DC

## 2018-10-21 NOTE — Telephone Encounter (Signed)
Prior Authorization submitted via cover my meds for Voltaren Gel. Will update once response is received.  

## 2018-10-21 NOTE — Telephone Encounter (Signed)
Voltaren Gel has been approved.

## 2018-10-28 ENCOUNTER — Telehealth: Payer: Self-pay | Admitting: Internal Medicine

## 2018-10-28 MED ORDER — FLUTICASONE FUROATE-VILANTEROL 100-25 MCG/INH IN AEPB: INHALATION_SPRAY | RESPIRATORY_TRACT | 0 refills | Status: DC

## 2018-10-28 NOTE — Telephone Encounter (Signed)
2 samples of [place] up front for pick up  Left detailed msg on machine for pt to be aware

## 2018-11-02 ENCOUNTER — Ambulatory Visit (HOSPITAL_COMMUNITY)
Admission: RE | Admit: 2018-11-02 | Discharge: 2018-11-02 | Disposition: A | Discharge status: Home / Self Care | Payer: Medicare Other | Source: Ambulatory Visit | Attending: Rheumatology | Admitting: Rheumatology

## 2018-11-02 DIAGNOSIS — M0579 Rheumatoid arthritis with rheumatoid factor of multiple sites without organ or systems involvement: Secondary | ICD-10-CM | POA: Insufficient documentation

## 2018-11-02 MED ORDER — DIPHENHYDRAMINE HCL 25 MG PO CAPS: 25.0000 mg | ORAL_CAPSULE | ORAL | Status: DC

## 2018-11-02 MED ORDER — TOCILIZUMAB 400 MG/20ML IV SOLN
4.0000 mg/kg | INTRAVENOUS | Status: DC
  Administered 2018-11-02: 400 mg via INTRAVENOUS
  Filled 2018-11-02: qty 20

## 2018-11-02 MED ORDER — ACETAMINOPHEN 325 MG PO TABS: 650.0000 mg | ORAL_TABLET | ORAL | Status: DC

## 2018-11-04 ENCOUNTER — Ambulatory Visit: Payer: Medicare Other | Admitting: Physician Assistant

## 2018-11-05 DIAGNOSIS — E6609 Other obesity due to excess calories: Secondary | ICD-10-CM | POA: Diagnosis not present

## 2018-11-05 DIAGNOSIS — Z1389 Encounter for screening for other disorder: Secondary | ICD-10-CM | POA: Diagnosis not present

## 2018-11-05 DIAGNOSIS — J069 Acute upper respiratory infection, unspecified: Secondary | ICD-10-CM | POA: Diagnosis not present

## 2018-11-05 DIAGNOSIS — M1A00X0 Idiopathic chronic gout, unspecified site, without tophus (tophi): Secondary | ICD-10-CM | POA: Diagnosis not present

## 2018-11-05 DIAGNOSIS — R7309 Other abnormal glucose: Secondary | ICD-10-CM | POA: Diagnosis not present

## 2018-11-05 DIAGNOSIS — Z0001 Encounter for general adult medical examination with abnormal findings: Secondary | ICD-10-CM | POA: Diagnosis not present

## 2018-11-05 DIAGNOSIS — E876 Hypokalemia: Secondary | ICD-10-CM | POA: Diagnosis not present

## 2018-11-05 DIAGNOSIS — Z681 Body mass index (BMI) 19 or less, adult: Secondary | ICD-10-CM | POA: Diagnosis not present

## 2018-11-05 DIAGNOSIS — I82409 Acute embolism and thrombosis of unspecified deep veins of unspecified lower extremity: Secondary | ICD-10-CM | POA: Diagnosis not present

## 2018-11-05 DIAGNOSIS — I10 Essential (primary) hypertension: Secondary | ICD-10-CM | POA: Diagnosis not present

## 2018-11-05 DIAGNOSIS — Z6832 Body mass index (BMI) 32.0-32.9, adult: Secondary | ICD-10-CM | POA: Diagnosis not present

## 2018-11-05 DIAGNOSIS — Z125 Encounter for screening for malignant neoplasm of prostate: Secondary | ICD-10-CM | POA: Diagnosis not present

## 2018-11-09 ENCOUNTER — Encounter: Payer: Self-pay | Admitting: Gastroenterology

## 2018-11-09 ENCOUNTER — Ambulatory Visit (INDEPENDENT_AMBULATORY_CARE_PROVIDER_SITE_OTHER): Payer: Medicare Other | Admitting: Gastroenterology

## 2018-11-09 VITALS — BP 144/76 | HR 84 | Ht 69.0 in | Wt 219.4 lb

## 2018-11-09 DIAGNOSIS — K921 Melena: Secondary | ICD-10-CM | POA: Diagnosis not present

## 2018-11-09 DIAGNOSIS — Z7901 Long term (current) use of anticoagulants: Secondary | ICD-10-CM | POA: Diagnosis not present

## 2018-11-09 DIAGNOSIS — Z86010 Personal history of colonic polyps: Secondary | ICD-10-CM | POA: Diagnosis not present

## 2018-11-09 DIAGNOSIS — Z860101 Personal history of adenomatous and serrated colon polyps: Secondary | ICD-10-CM

## 2018-11-09 NOTE — Progress Notes (Signed)
    History of Present Illness: This is a 67 year old male with intermittent small volume hematochezia for the past 3 months.  He is accompanied by his wife.  He has multiple comorbidities.  He is maintained on Coumadin for recurrent DVTs and venous insufficiency.  He notes small amounts of blood on the tissue paper when wiping intermittently for the past 3 months.  He occasionally notes a small streak of blood on the stool.  He notes his stools are less formed, looser recently.  He underwent prostatectomy in 2016 and then radiation therapy for prostate cancer in late 2017.   Colonoscopy 06/2016: - One 7 mm polyp in the sigmoid colon, removed with a cold snare. Resected and retrieved. - One 4 mm polyp in the transverse colon, removed with a cold biopsy forceps. Resected and retrieved. - Mild diverticulosis in the sigmoid colon and in the descending colon.  - Small, grade I internal hemorrhoids  Current Medications, Allergies, Past Medical History, Past Surgical History, Family History and Social History were reviewed in [employer] record.  Physical Exam: General: Well developed, well nourished, no acute distress Head: Normocephalic and atraumatic Eyes:  sclerae anicteric, EOMI Ears: Normal auditory acuity Mouth: No deformity or lesions Lungs: Clear throughout to auscultation Heart: Regular rate and rhythm; no murmurs, rubs or bruits Abdomen: Soft, non tender and non distended. No masses, hepatosplenomegaly or hernias noted. Normal Bowel sounds Rectal: small external tags, no lesions, no tenderness, heme negative stool Musculoskeletal: Symmetrical with no gross deformities  Pulses:  Normal pulses noted Extremities: No clubbing, cyanosis, noted. 2+ pitting edema, chronic venous stasis changes Neurological: Alert oriented x 4, grossly nonfocal Psychological:  Alert and cooperative. Normal mood and affect   Assessment and Recommendations:  1. Small volume  hematochezia.  Hemorrhoids versus radiation proctitis.  Trial of Preparation H suppositories coated with 1% hydrocortisone cream at bedtime for 2 weeks.  Repeat if symptoms persist.  If symptoms are persistent or worsen consider colonoscopy or sigmoidoscopy to further evaluate.  If significant radiation proctitis is present consider APC and if internal hemorrhoids appear to be the source of bleeding consider hemorrhoidal banding.  2. Personal history of adenomatous colon polyps.  A 5-year interval surveillance colonoscopy is recommended in July 2022.  3. Chronic right-sided heart failure.    4. Venous insufficiency and history of recurrent DVT.  Chronic use of anticoagulants, Coumadin.  5. COPD.   6. RA.   7. History of prostate cancer.  Status post prostatectomy and radiation therapy.

## 2018-11-09 NOTE — Patient Instructions (Signed)
Purchase over the counter Preparation H suppositories and coat them in 1 % hydrocortisone cream every day at bedtime x 2 weeks.  Call back if your symptoms are no better or if your bleeding increases.  Normal BMI (Body Mass Index- based on height and weight) is between 23 and 30. Your BMI today is Body mass index is 32.4 kg/m. [name] Please consider follow up  regarding your BMI with your Primary Care Provider.  Thank you for choosing me and [facility].  Pricilla Riffle. Dagoberto Ligas., MD., Marval Regal

## 2018-11-16 ENCOUNTER — Ambulatory Visit: Payer: Medicare Other | Admitting: Rheumatology

## 2018-11-19 ENCOUNTER — Other Ambulatory Visit: Payer: Self-pay | Admitting: *Deleted

## 2018-11-19 NOTE — Progress Notes (Signed)
Infusion orders are current for patient CBC CMP Tylenol Benadryl appointments are up to date and follow up appointment  is scheduled TB gold not due yet.  

## 2018-11-25 ENCOUNTER — Other Ambulatory Visit: Payer: Self-pay | Admitting: Internal Medicine

## 2018-11-25 MED ORDER — UMECLIDINIUM BROMIDE 62.5 MCG/INH IN AEPB: 1.0000 | INHALATION_SPRAY | Freq: Every day | RESPIRATORY_TRACT | 2 refills | Status: DC

## 2018-11-29 ENCOUNTER — Emergency Department (HOSPITAL_COMMUNITY)
Admission: EM | Admit: 2018-11-29 | Discharge: 2018-11-29 | Disposition: A | Discharge status: Home / Self Care | Payer: Medicare Other | Attending: Emergency Medicine | Admitting: Emergency Medicine

## 2018-11-29 ENCOUNTER — Other Ambulatory Visit: Payer: Self-pay | Admitting: Rheumatology

## 2018-11-29 ENCOUNTER — Encounter (HOSPITAL_COMMUNITY): Payer: Self-pay | Admitting: Emergency Medicine

## 2018-11-29 DIAGNOSIS — Z79899 Other long term (current) drug therapy: Secondary | ICD-10-CM | POA: Insufficient documentation

## 2018-11-29 DIAGNOSIS — Z87891 Personal history of nicotine dependence: Secondary | ICD-10-CM | POA: Insufficient documentation

## 2018-11-29 DIAGNOSIS — R310 Gross hematuria: Secondary | ICD-10-CM | POA: Diagnosis not present

## 2018-11-29 DIAGNOSIS — J45909 Unspecified asthma, uncomplicated: Secondary | ICD-10-CM | POA: Diagnosis not present

## 2018-11-29 DIAGNOSIS — Z7901 Long term (current) use of anticoagulants: Secondary | ICD-10-CM | POA: Diagnosis not present

## 2018-11-29 LAB — CBC WITH DIFFERENTIAL/PLATELET
Abs Immature Granulocytes: 0.01 10*3/uL (ref 0.00–0.07)
Basophils Absolute: 0.1 10*3/uL (ref 0.0–0.1)
Basophils Relative: 1 %
Eosinophils Absolute: 0.4 10*3/uL (ref 0.0–0.5)
Eosinophils Relative: 7 %
HCT: 42.9 % (ref 39.0–52.0)
Hemoglobin: 13.8 g/dL (ref 13.0–17.0)
Immature Granulocytes: 0 %
Lymphocytes Relative: 13 %
Lymphs Abs: 0.8 10*3/uL (ref 0.7–4.0)
MCH: 29.9 pg (ref 26.0–34.0)
MCHC: 32.2 g/dL (ref 30.0–36.0)
MCV: 93.1 fL (ref 80.0–100.0)
Monocytes Absolute: 0.8 10*3/uL (ref 0.1–1.0)
Monocytes Relative: 13 %
Neutro Abs: 3.7 10*3/uL (ref 1.7–7.7)
Neutrophils Relative %: 66 %
Platelets: 174 10*3/uL (ref 150–400)
RBC: 4.61 MIL/uL (ref 4.22–5.81)
RDW: 13.8 % (ref 11.5–15.5)
WBC: 5.7 10*3/uL (ref 4.0–10.5)
nRBC: 0 % (ref 0.0–0.2)

## 2018-11-29 LAB — URINALYSIS, ROUTINE W REFLEX MICROSCOPIC
Bacteria, UA: NONE SEEN
Bilirubin Urine: NEGATIVE
Glucose, UA: NEGATIVE mg/dL
Ketones, ur: NEGATIVE mg/dL
Leukocytes, UA: NEGATIVE
Nitrite: NEGATIVE
Protein, ur: 30 mg/dL — AB
RBC / HPF: >50 RBC/hpf — ABNORMAL HIGH (ref 0–5)
Specific Gravity, Urine: 1.025 (ref 1.005–1.030)
pH: 5 (ref 5.0–8.0)

## 2018-11-29 LAB — PROTIME-INR
INR: 1.73
Prothrombin Time: 20 seconds — ABNORMAL HIGH (ref 11.4–15.2)

## 2018-11-29 NOTE — ED Provider Notes (Signed)
[facility] EMERGENCY DEPARTMENT Provider Note   CSN: [id number] Arrival date & time: 11/29/18  1017     History   Chief Complaint Chief Complaint  Patient presents with  . Hematuria    HPI [name] is a 67 y.o. male.  HPI With history of DVT on Coumadin presents with gross hematuria upon waking this morning.  States that the urine has been clearing as the day progresses.  Has some low back pain but states this is chronic.  Denies any fever or chills.  No dysuria or frequency.  No lightheadedness or dizziness.  Has an appointment to follow-up with his urologist. Past Medical History:  Diagnosis Date  . Anxiety    hx of   . Arthritis   . Asthma   . Clotting disorder (HCC)    DVT both legs   . COPD (chronic obstructive pulmonary disease) ([place])   . DDD (degenerative disc disease), cervical    with UE's paresthesias  . Diverticulosis   . DVT, lower extremity ([place])    bilat  . 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 GERD (gastroesophageal reflux disease)   . Gout   . Heart murmur   . History of measles   . History of shingles   . Hypercholesterolemia   . IBS (irritable bowel syndrome)   . IBS (irritable bowel syndrome)   . Peripheral edema   . Pneumonia    hx of   . Prostate cancer ([place])   . Seizures ([place])    last seizure 20 years ago   . Shortness of breath dyspnea    exertion   . Sleep apnea    not on cpap  . Tubular adenoma of colon 04/2008    Patient Active Problem List   Diagnosis Date Noted  . Dyspnea on exertion 10/22/2017  . History of seizure disorder 02/27/2017  . Primary osteoarthritis of both hands 02/27/2017  . Primary osteoarthritis of both feet 02/27/2017  . Idiopathic gout of multiple sites 02/27/2017  . High risk medication use 12/29/2016  . History of prostate cancer 12/29/2016  . Primary osteoarthritis of both knees 12/29/2016  . Chronic idiopathic gout involving toe  without tophus 12/29/2016  . History of CHF (congestive heart failure) 12/29/2016  . History of COPD 12/29/2016  . Plantar pustular psoriasis 12/29/2016  . DVT (deep venous thrombosis) ([place]) 10/31/2016  . Chronic obstructive pulmonary disease ([place]) 10/31/2016  . CHF (congestive heart failure) ([place]) 10/31/2016  . Prostate cancer ([place]) 08/30/2015  . Malignant neoplasm of prostate ([place]) 07/04/2015  . Chest pain at rest 07/05/2014  . Weakness 07/05/2014  . Complicated postphlebitic syndrome 11/16/2013  . Personal history of DVT (deep vein thrombosis) 05/18/2013  . Chronic anticoagulation 05/18/2013  . Diverticulosis of colon without hemorrhage 05/18/2013  . Rheumatoid arthritis ([place]) 05/18/2013  . Seizure disorder ([place]) 05/18/2013  . Hx of adenomatous colonic polyps 05/18/2013  . GERD 05/08/2010  . NAUSEA 05/08/2010  . FLATULENCE-GAS-BLOATING 05/08/2010    Past Surgical History:  Procedure Laterality Date  . CHOLECYSTECTOMY    . COLONOSCOPY    . DOPPLER ECHOCARDIOGRAPHY N/A 01-21-2012   TECHNICALLY DIFFICULT. MILD CONCENTRIC LV HYPERTROPHY. LV CAVITY IS SMALL.. EF=> 55%. TRANSMITRAL SPECTRAL FLOW PATTREN IS SUGGESTIVE OF IMPAIRED LV RELAXATION. RV SYSTOLIC PRESSURE IS 09BDZH. LEFT ATRIAL SIZE IS NORMAL. AV APPEARS MILDLY SCLEROTIC. NO SIGN VALVE DISEASE NOTED.  [name] LYMPHADENECTOMY Bilateral 08/30/2015   Procedure:  PELVIC LYMPHADENECTOMY;  Surgeon: [name], MD;  Location: [facility];  Service: Urology;  Laterality: Bilateral;  . NUCLEAR STRESS TEST N/A 01-21-2012   NORMAL PATTERN OF PERFUSION IN ALL REGIONS. POST STRESS LV SIZE IS NORMAL. NO EVIDENCE OF INDUCIBLE ISCHEMIA. EF 54%.  [name] POLYPECTOMY    . PROSTATE BIOPSY    . ROBOT ASSISTED LAPAROSCOPIC RADICAL PROSTATECTOMY N/A 08/30/2015   Procedure: ROBOTIC ASSISTED LAPAROSCOPIC RADICAL PROSTATECTOMY;  Surgeon: [name], MD;  Location: [facility];  Service: Urology;  Laterality: N/A;  . US VENOUS LOWER EXT Right 03/07/11    PERSISTENT DVT IN RIGHT LOWER EXT.WITH PERSISTANT VISUALIZATION OF HYPOECHOIC THROMBUS WITHIN THE FEMORAL, PROFUNDA FEMORAL AND POPLITEAL VEINS. WHEN COMPARED TO PREVIOUS, CLOT IS NO LONGER IDENTIFIED WITH IN THE RIGHT CFV.        Home Medications    Prior to Admission medications   Medication Sig Start Date End Date Taking? Authorizing Provider  acetaminophen (TYLENOL) 500 MG tablet Take 1,000 mg by mouth every 6 (six) hours as needed (arthritis pain).    [provider]  albuterol (PROVENTIL) (2.5 MG/3ML) 0.083% nebulizer solution Take 3 mLs (2.5 mg total) by nebulization every 6 (six) hours as needed for wheezing or shortness of breath. 07/05/17   Noemi Chapel, MD  albuterol (PROVENTIL) 2 MG tablet Take 2 mg by mouth daily.     [provider]  alendronate (FOSAMAX) 70 MG tablet TAKE 1 TABLET BY MOUTH ONCE A WEEK. TAKE WITH FULL GLASS OF WATER ON EMPTY STOMACH 03/18/18   Bo Merino, MD  allopurinol (ZYLOPRIM) 100 MG tablet TAKE 1 TABLET BY MOUTH TWICE A DAY 09/03/18   Deveshwar, Abel Presto, MD  Calcium-Phosphorus-Vitamin D (CITRACAL +D3 PO) Take 2 tablets by mouth 2 (two) times daily.     [provider]  diclofenac sodium (VOLTAREN) 1 % GEL Apply 3 grams to 3 large joints up to 3 times daily 10/21/18   Ofilia Neas, PA-C  dicyclomine (BENTYL) 10 MG capsule TAKE 1 CAPSULE BY MOUTH 4 TIMES DAILY BEFORE MEALS AND AT BEDTIME Patient taking differently: TAKE 2 CAPSULE BY MOUTH TWO TIMES DAILY 08/11/17   Ladene Artist, MD  fluticasone furoate-vilanterol (BREO ELLIPTA) 100-25 MCG/INH AEPB TAKE 1 PUFF BY MOUTH EVERY DAY 10/28/18   Brand Males, MD  folic acid (FOLVITE) 1 MG tablet TAKE 2 TABLETS BY MOUTH EVERY MORNING 12/05/17   Bo Merino, MD  furosemide (LASIX) 40 MG tablet Take 1 tablet (40 mg total) by mouth 2 (two) times daily. Patient taking differently: Take 80 mg by mouth daily.  07/13/18   Croitoru, Mihai, MD  leflunomide (ARAVA) 20 MG tablet Take 1  tablet (20 mg total) by mouth daily. 09/28/18   Bo Merino, MD  omeprazole (PRILOSEC) 40 MG capsule Take 40 mg by mouth 2 (two) times daily.  09/23/16   [provider]  phenytoin (DILANTIN) 100 MG ER capsule Take 100-200 mg by mouth 2 (two) times daily. Take one capsule in the morning and 2 capsules at bedtime    [provider]  Tocilizumab (ACTEMRA IV) Inject into the vein every 28 (twenty-eight) days. For Rheumatoid Arthritis    [provider]  umeclidinium bromide (INCRUSE ELLIPTA) 62.5 MCG/INH AEPB Inhale 1 puff into the lungs daily. 11/25/18   Brand Males, MD  warfarin (COUMADIN) 3 MG tablet Take 3 mg by mouth at bedtime.     [provider]    Family History Family History  Problem Relation Age of  Onset  . Skin cancer Father   . Stomach cancer Father   . Heart attack Father   . Alzheimer's disease Mother   . Throat cancer Brother   . Stomach cancer Brother   . Lung cancer Brother   . Heart attack Brother   . Heart attack Brother   . Breast cancer Sister   . Heart attack Sister   . Stroke Sister   . Bladder Cancer Sister   . Heart murmur Sister   . Colon cancer Brother   . Colon polyps Neg Hx     Social History Social History   Tobacco Use  . Smoking status: Former Smoker    Packs/day: 3.00    Years: 20.00    Pack years: 60.00    Types: Cigarettes    Last attempt to quit: 12/30/1989    Years since quitting: 28.9  . Smokeless tobacco: Never Used  Substance Use Topics  . Alcohol use: No    Alcohol/week: 0.0 standard drinks  . Drug use: No     Allergies   Orencia [abatacept]; Carbamazepine; Celecoxib; Cephalexin; Enbrel [etanercept]; Humira [adalimumab]; Levofloxacin; Sulfa antibiotics; and Sulfasalazine   Review of Systems Review of Systems  Constitutional: Negative for chills and fever.  Respiratory: Negative for shortness of breath.   Cardiovascular: Negative for chest pain.  Gastrointestinal: Negative for  abdominal pain, nausea and vomiting.  Genitourinary: Positive for hematuria. Negative for dysuria, flank pain, frequency, penile pain, penile swelling and scrotal swelling.  Musculoskeletal: Positive for back pain and myalgias. Negative for neck pain and neck stiffness.  Skin: Negative for rash and wound.  Neurological: Negative for dizziness, weakness, light-headedness and numbness.  All other systems reviewed and are negative.    Physical Exam Updated Vital Signs BP (!) 166/83 (BP Location: Right Arm)   Pulse 80   Temp 98 F (36.7 C) (Oral)   Resp 18   Ht 5\' 9"  (1.753 m)   Wt 99.8 kg   SpO2 96%   BMI 32.49 kg/m   Physical Exam  Constitutional: He is oriented to person, place, and time. He appears well-developed and well-nourished. No distress.  HENT:  Head: Normocephalic and atraumatic.  Mouth/Throat: Oropharynx is clear and moist.  Eyes: Pupils are equal, round, and reactive to light. EOM are normal.  Neck: Normal range of motion. Neck supple. No JVD present.  Cardiovascular: Normal rate and regular rhythm. Exam reveals no gallop and no friction rub.  No murmur heard. Pulmonary/Chest: Effort normal and breath sounds normal. No stridor. No respiratory distress. He has no wheezes. He has no rales. He exhibits no tenderness.  Abdominal: Soft. Bowel sounds are normal. He exhibits no distension and no mass. There is no tenderness. There is no rebound and no guarding.  Musculoskeletal: Normal range of motion. He exhibits no edema or tenderness.  No midline T/L ttp. No CVA tenderness.  1+ bilateral lower extremity pitting edema.  Distal pulses intact.  Lymphadenopathy:    He has no cervical adenopathy.  Neurological: He is alert and oriented to person, place, and time.  Skin: Skin is warm and dry. No rash noted. He is not diaphoretic. No erythema.  Psychiatric: He has a normal mood and affect. His behavior is normal.  Nursing note and vitals reviewed.    ED Treatments /  Results  Labs (all labs ordered are listed, but only abnormal results are displayed) Labs Reviewed  URINALYSIS, ROUTINE W REFLEX MICROSCOPIC - Abnormal; Notable for the following components:  Result Value   APPearance HAZY (*)    Hgb urine dipstick MODERATE (*)    Protein, ur 30 (*)    RBC / HPF >50 (*)    All other components within normal limits  PROTIME-INR - Abnormal; Notable for the following components:   Prothrombin Time 20.0 (*)    All other components within normal limits  CBC WITH DIFFERENTIAL/PLATELET    EKG None  Radiology No results found.  Procedures Procedures (including critical care time)  Medications Ordered in ED Medications - No data to display   Initial Impression / Assessment and Plan / ED Course  I have reviewed the triage vital signs and the nursing notes.  Pertinent labs & imaging results that were available during my care of the patient were reviewed by me and considered in my medical decision making (see chart for details).     Patient states that gross hematuria has completely cleared.  No evidence of infectious process.  Has appointment to follow-up with his urologist.  Strict return precautions have been given.  Final Clinical Impressions(s) / ED Diagnoses   Final diagnoses:  Gross hematuria    ED Discharge Orders    None       Julianne Rice, MD 11/29/18 1440

## 2018-11-29 NOTE — ED Triage Notes (Signed)
Pt states he began having blood in his urine this morning.  States he has been having a little back pain but denies dysuria.  Is on coumadin with last INR 3 weeks ago.

## 2018-11-30 ENCOUNTER — Ambulatory Visit (HOSPITAL_COMMUNITY)
Admission: RE | Admit: 2018-11-30 | Discharge: 2018-11-30 | Disposition: A | Discharge status: Home / Self Care | Payer: Medicare Other | Source: Ambulatory Visit | Attending: Rheumatology | Admitting: Rheumatology

## 2018-11-30 DIAGNOSIS — M0579 Rheumatoid arthritis with rheumatoid factor of multiple sites without organ or systems involvement: Secondary | ICD-10-CM

## 2018-11-30 LAB — COMPREHENSIVE METABOLIC PANEL
ALT: 30 U/L (ref 0–44)
AST: 31 U/L (ref 15–41)
Albumin: 3.7 g/dL (ref 3.5–5.0)
Alkaline Phosphatase: 51 U/L (ref 38–126)
Anion gap: 9 (ref 5–15)
BUN: 9 mg/dL (ref 8–23)
CO2: 31 mmol/L (ref 22–32)
Calcium: 9.2 mg/dL (ref 8.9–10.3)
Chloride: 100 mmol/L (ref 98–111)
Creatinine, Ser: 1.25 mg/dL — ABNORMAL HIGH (ref 0.61–1.24)
GFR calc Af Amer: >60 mL/min (ref >60)
GFR calc non Af Amer: 59 mL/min — ABNORMAL LOW (ref >60)
Glucose, Bld: 114 mg/dL — ABNORMAL HIGH (ref 70–99)
Potassium: 3.9 mmol/L (ref 3.5–5.1)
Sodium: 140 mmol/L (ref 135–145)
Total Bilirubin: 0.8 mg/dL (ref 0.3–1.2)
Total Protein: 6.2 g/dL — ABNORMAL LOW (ref 6.5–8.1)

## 2018-11-30 MED ORDER — TOCILIZUMAB 400 MG/20ML IV SOLN
4.0000 mg/kg | INTRAVENOUS | Status: DC
  Administered 2018-11-30: 400 mg via INTRAVENOUS
  Filled 2018-11-30: qty 20

## 2018-11-30 MED ORDER — DIPHENHYDRAMINE HCL 25 MG PO CAPS: 25.0000 mg | ORAL_CAPSULE | ORAL | Status: DC

## 2018-11-30 MED ORDER — ACETAMINOPHEN 325 MG PO TABS: 650.0000 mg | ORAL_TABLET | ORAL | Status: DC

## 2018-11-30 NOTE — Progress Notes (Signed)
GFR is lower. We will observe.

## 2018-11-30 NOTE — Telephone Encounter (Signed)
Last visit: 10/21/18 Next visit: 02/03/19 Labs: 10/05/18 stable  Okay to refill per Dr. Estanislado Pandy

## 2018-12-01 ENCOUNTER — Telehealth: Payer: Self-pay | Admitting: Rheumatology

## 2018-12-01 NOTE — Telephone Encounter (Signed)
Patient called stating he was returning your call regarding his labwork. °

## 2018-12-01 NOTE — Telephone Encounter (Signed)
Patient advised GFR is lower. We will observe. Patient verbalized understanding.

## 2018-12-02 ENCOUNTER — Encounter: Payer: Self-pay | Admitting: Internal Medicine

## 2018-12-02 ENCOUNTER — Ambulatory Visit (INDEPENDENT_AMBULATORY_CARE_PROVIDER_SITE_OTHER): Payer: Medicare Other | Admitting: Internal Medicine

## 2018-12-02 VITALS — BP 136/80 | HR 77 | Ht 69.0 in | Wt 225.6 lb

## 2018-12-02 DIAGNOSIS — J449 Chronic obstructive pulmonary disease, unspecified: Secondary | ICD-10-CM | POA: Diagnosis not present

## 2018-12-02 DIAGNOSIS — Z87891 Personal history of nicotine dependence: Secondary | ICD-10-CM

## 2018-12-02 DIAGNOSIS — R0609 Other forms of dyspnea: Secondary | ICD-10-CM

## 2018-12-02 DIAGNOSIS — Z129 Encounter for screening for malignant neoplasm, site unspecified: Secondary | ICD-10-CM

## 2018-12-02 DIAGNOSIS — Z720 Tobacco use: Secondary | ICD-10-CM | POA: Diagnosis not present

## 2018-12-02 DIAGNOSIS — R0600 Dyspnea, unspecified: Secondary | ICD-10-CM

## 2018-12-02 MED ORDER — FLUTICASONE FUROATE-VILANTEROL 100-25 MCG/INH IN AEPB: 1.0000 | INHALATION_SPRAY | Freq: Every day | RESPIRATORY_TRACT | 0 refills | Status: DC

## 2018-12-02 NOTE — Patient Instructions (Addendum)
Moderate COPD (chronic obstructive pulmonary disease) (HCC)  - stable - continue breo and incruse daily; glad is helping - albuterol as needed - Please talk to PCP [name], MD -  and ensure you get  shingrix ([place]) inactivated vaccine against shingles   Dyspnea on exertion - due to several reasons from copd and weight and fitness - glad rehab helped - when possible rejoin [facility] program [facility]  Cancer screening Smoking history > 60 pack  - do followup low dose CT chest (last one was sept 2018) - next few wweeks  Followup - 6 months or sooner if needed

## 2018-12-02 NOTE — Progress Notes (Signed)
PCP Mark Sites, MD   HPI  IOV 10/22/2017  Chief Complaint  Patient presents with  . Advice Only    Referred by Dr. Estanislado [name] with a history of COPD. Pt had a PFT done 02/26/2012 and states that that was when he was dx with moderate COPD. C/o SOB mainly on exertion but states that he has also been SOB at other times when not exerting, occ. dry cough. Denies any CP.   67 year old male. Prestns with wife.  Referred for dyspnea  With ? Of copd. REports quitting smoking many many years ago. In 2013, pparently had a PFT and was diagnosed with COPD. He and his wife say that he has a maintenance inhaler that he does not use.He uses an albuterol rescue inhaler occasionally. He is bothered by shortness of breath and mild amount of cough as documented below. Also 2013 he had rheumatoid arthritis and he's been on some immunosuppressive therapy. His workup does show impaired left diaphragm function and diastolic dysfunction on echo. He ruled out for pulmonary embolism recently. I personally visualized theCT chest. Walking desaturation test today he did not desaturate. He does admit to some wheezing as well.       ECHO JAn 2013 LVH with impaired relaxation. EF 55% Nuclear medicine cardiac stress test July 2015: Normal CT angiogram chest 09/25/2017: Normal lung fields no pulmonary embolism. Chronic elevation of the left hemidiaphragm personally visualized the CT chest Troponin 09/25/2017: Normal less than 0.03 Creatinine September 21 2017 1.3 mg percent Hemoglobin 14.5 g percent 09/25/2018 Walking desaturation test 185 feet 3 laps on room air: ppulse ox 81 HR/min -> 100/min. Pulse ox 99% -> 96%   OV 11/26/2017  Chief Complaint  Patient presents with  . Follow-up    follow up from PFT, wheezing at times, sob    Followup : Shortness of breath is because of weight, frozen left diaphragm, diastolic dysfunction and history of COPD.   Follow-up multifactorial dyspnea.  Dyspnea is due to  the above reasons.  Last visit because of clinical history of COPD we will put him on Spiriva he says this is helped but only somewhat he still has residual dyspnea and some orthopnea particularly when he lies down which he thinks is because of his visceral obesity and diaphragmatic paralysis.  It is associated with wheezing.  Overall his pulmonary function test is a mixed obstructive restrictive pattern.  Although more towards restrictive.  He had pulmonary function test done today November 18, 2017: His FEV1 is 1.77 L / 54%, FVC 2.16 L or 49%, ratio of 82/110%.  Suggestive of restriction.  DLCO is 19.58/63% and reduced consistent with alveolar perfusion defect.  We do not have lung volumes.  There is no bronchodilator response.  He is asking about different options to address his dyspnea.        OV 12/02/2018  Subjective:  Patient ID: [name], male , DOB: [birth year] , age 67 y.o. , MRN: [id number] , ADDRESS: [place] [address]   12/02/2018 -   Chief Complaint  Patient presents with  . Follow-up    follows for DOE. breathing doing well. uses inhalers daily.    Gold stage II COPD with multifactorial dyspnea [dyspnea due to COPD, visceral obesity, left diaphragmatic paralysis and diastolic dysfunction] in setting of RA. Normal stress test 2015, last echo 2013  HPI [name] [id number] 67 y.o. -follow-up for gold stage II COPD multifactorial dyspnea.  It is 1  year since his last saw him.  In the interim he saw a Designer, jewellery.  He is now needing triple inhaler therapy because of his multifactorial dyspnea.  This is helping him.  He says his dyspnea is improved although on the COPD CAT score it is the same.  Overall though the level of CAT score has improved.  He stable.  He did pulmonary rehabilitation early part of 2019 and this helped him significantly.  In the latter part he joined [employer] locally but could not follow through because his friend fell ill.  At this  point in time he reports no issues.  He is up-to-date with his flu shot and pneumonia shot.   He is immunosuppressed with rheumatoid arthritis    CAT COPD Symptom & Quality of Life Score ([place]) 0 is no burden. 5 is highest burden 10/22/2017  12/02/2018   Never Cough -> Cough all the time 1 1  No phlegm in chest -> Chest is full of phlegm 0 2  No chest tightness -> Chest feels very tight 0 1  No dyspnea for 1 flight stairs/hill -> Very dyspneic for 1 flight of stairs 5 5  No limitations for ADL at home -> Very limited with ADL at home 3 0  Confident leaving home -> Not at all confident leaving home 5 0  Sleep soundly -> Do not sleep soundly because of lung condition 3 1  Lots of Energy -> No energy at all 3 3  TOTAL Score (max 40)  20 13    ROS - per HPI     has a past medical history of Anxiety, Arthritis, Asthma, Clotting disorder ([place]), COPD (chronic obstructive pulmonary disease) ([place]), DDD (degenerative disc disease), cervical, Diverticulosis, DVT, lower extremity ([place]), Eye abnormality, GERD (gastroesophageal reflux disease), Gout, Heart murmur, History of measles, History of shingles, Hypercholesterolemia, IBS (irritable bowel syndrome), IBS (irritable bowel syndrome), Peripheral edema, Pneumonia, Prostate cancer ([place]), Seizures ([place]), Shortness of breath dyspnea, Sleep apnea, and Tubular adenoma of colon (04/2008).   reports that he quit smoking about 28 years ago. His smoking use included cigarettes. He has a 60.00 pack-year smoking history. He has never used smokeless tobacco.  Past Surgical History:  Procedure Laterality Date  . CHOLECYSTECTOMY    . COLONOSCOPY    . DOPPLER ECHOCARDIOGRAPHY N/A 01-21-2012   TECHNICALLY DIFFICULT. MILD CONCENTRIC LV HYPERTROPHY. LV CAVITY IS SMALL.. EF=> 55%. TRANSMITRAL SPECTRAL FLOW PATTREN IS SUGGESTIVE OF IMPAIRED LV RELAXATION. RV SYSTOLIC PRESSURE IS 37JIRC. LEFT ATRIAL SIZE IS NORMAL. AV APPEARS MILDLY SCLEROTIC. NO SIGN  VALVE DISEASE NOTED.  [name] LYMPHADENECTOMY Bilateral 08/30/2015   Procedure: PELVIC LYMPHADENECTOMY;  Surgeon: Mark Gustin, MD;  Location: [facility];  Service: Urology;  Laterality: Bilateral;  . NUCLEAR STRESS TEST N/A 01-21-2012   NORMAL PATTERN OF PERFUSION IN ALL REGIONS. POST STRESS LV SIZE IS NORMAL. NO EVIDENCE OF INDUCIBLE ISCHEMIA. EF 54%.  [name] POLYPECTOMY    . PROSTATE BIOPSY    . ROBOT ASSISTED LAPAROSCOPIC RADICAL PROSTATECTOMY N/A 08/30/2015   Procedure: ROBOTIC ASSISTED LAPAROSCOPIC RADICAL PROSTATECTOMY;  Surgeon: Mark Gustin, MD;  Location: [facility];  Service: Urology;  Laterality: N/A;  . US VENOUS LOWER EXT Right 03/07/11   PERSISTENT DVT IN RIGHT LOWER EXT.WITH PERSISTANT VISUALIZATION OF HYPOECHOIC THROMBUS WITHIN THE FEMORAL, PROFUNDA FEMORAL AND POPLITEAL VEINS. WHEN COMPARED TO PREVIOUS, CLOT IS NO LONGER IDENTIFIED WITH IN THE RIGHT CFV.    Allergies  Allergen Reactions  . Orencia [Abatacept] Anaphylaxis  Had prostate cancer  . Carbamazepine Rash    REACTION: makes drowsy BRAND NAME IS TEGRETOL  . Celecoxib Itching and Rash    BRAND NAME IS CELEBREX  . Cephalexin Rash    "Severe Rash".  BRAND NAME IS KEFLEX.   . Enbrel [Etanercept] Rash  . Humira [Adalimumab] Rash  . Levofloxacin Other (See Comments)    REACTION: GI Intolerance BRAND NAME IS LEVAQUIN  . Sulfa Antibiotics Rash  . Sulfasalazine Rash    Immunization History  Administered Date(s) Administered  . Influenza, High Dose Seasonal PF 10/05/2018  . Influenza,inj,Quad PF,6+ Mos 08/31/2015  . Influenza-Unspecified 09/30/2016, 08/22/2017  . Pneumococcal Conjugate-13 10/05/2018  . Pneumococcal-Unspecified 12/30/2012    Family History  Problem Relation Age of Onset  . Skin cancer Father   . Stomach cancer Father   . Heart attack Father   . Alzheimer's disease Mother   . Throat cancer Brother   . Stomach cancer Brother   . Lung cancer Brother   . Heart attack Brother   . Heart attack  Brother   . Breast cancer Sister   . Heart attack Sister   . Stroke Sister   . Bladder Cancer Sister   . Heart murmur Sister   . Colon cancer Brother   . Colon polyps Neg Hx      Current Outpatient Medications:  .  acetaminophen (TYLENOL) 500 MG tablet, Take 1,000 mg by mouth every 6 (six) hours as needed (arthritis pain)., Disp: , Rfl:  .  albuterol (PROVENTIL) (2.5 MG/3ML) 0.083% nebulizer solution, Take 3 mLs (2.5 mg total) by nebulization every 6 (six) hours as needed for wheezing or shortness of breath., Disp: 75 mL, Rfl: 12 .  albuterol (PROVENTIL) 2 MG tablet, Take 2 mg by mouth daily. , Disp: , Rfl:  .  alendronate (FOSAMAX) 70 MG tablet, TAKE 1 TABLET BY MOUTH ONCE A WEEK. TAKE WITH FULL GLASS OF WATER ON EMPTY STOMACH, Disp: 12 tablet, Rfl: 0 .  allopurinol (ZYLOPRIM) 100 MG tablet, TAKE 1 TABLET BY MOUTH TWICE A DAY, Disp: 180 tablet, Rfl: 0 .  Calcium-Phosphorus-Vitamin D (CITRACAL +D3 PO), Take 2 tablets by mouth 2 (two) times daily. , Disp: , Rfl:  .  diclofenac sodium (VOLTAREN) 1 % GEL, Apply 3 grams to 3 large joints up to 3 times daily, Disp: 3 Tube, Rfl: 3 .  dicyclomine (BENTYL) 10 MG capsule, TAKE 1 CAPSULE BY MOUTH 4 TIMES DAILY BEFORE MEALS AND AT BEDTIME (Patient taking differently: TAKE 2 CAPSULE BY MOUTH TWO TIMES DAILY), Disp: 120 capsule, Rfl: 0 .  fluticasone furoate-vilanterol (BREO ELLIPTA) 100-25 MCG/INH AEPB, TAKE 1 PUFF BY MOUTH EVERY DAY, Disp: 28 each, Rfl: 0 .  folic acid (FOLVITE) 1 MG tablet, TAKE 2 TABLETS BY MOUTH EVERY MORNING, Disp: 180 tablet, Rfl: 4 .  furosemide (LASIX) 40 MG tablet, Take 1 tablet (40 mg total) by mouth 2 (two) times daily. (Patient taking differently: Take 80 mg by mouth daily. ), Disp: 60 tablet, Rfl: 11 .  leflunomide (ARAVA) 20 MG tablet, Take 1 tablet (20 mg total) by mouth daily., Disp: 90 tablet, Rfl: 0 .  Omega-3 Fatty Acids (FISH OIL) 1200 MG CPDR, Take 1,200 mg by mouth 2 (two) times daily., Disp: , Rfl:  .  omeprazole  (PRILOSEC) 40 MG capsule, Take 40 mg by mouth 2 (two) times daily. , Disp: , Rfl: 2 .  phenytoin (DILANTIN) 100 MG ER capsule, Take 100-200 mg by mouth 2 (two) times daily. Take one  capsule in the morning and 2 capsules at bedtime, Disp: , Rfl:  .  Tocilizumab (ACTEMRA IV), Inject into the vein every 28 (twenty-eight) days. For Rheumatoid Arthritis, Disp: , Rfl:  .  umeclidinium bromide (INCRUSE ELLIPTA) 62.5 MCG/INH AEPB, Inhale 1 puff into the lungs daily., Disp: 30 each, Rfl: 2 .  warfarin (COUMADIN) 3 MG tablet, Take 3 mg by mouth at bedtime. , Disp: , Rfl:  .  fluticasone furoate-vilanterol (BREO ELLIPTA) 100-25 MCG/INH AEPB, Inhale 1 puff into the lungs daily., Disp: 1 each, Rfl: 0      Objective:   Vitals:   12/02/18 0858  BP: 136/80  Pulse: 77  SpO2: 94%  Weight: 225 lb 9.6 oz (102.3 kg)  Height: 5\' 9"  (1.753 m)    Estimated body mass index is 33.32 kg/m as calculated from the following:   Height as of this encounter: 5\' 9"  (1.753 m).   Weight as of this encounter: 225 lb 9.6 oz (102.3 kg).  @WEIGHTCHANGE @  [employer]   12/02/18 0858  Weight: 225 lb 9.6 oz (102.3 kg)     Physical Exam  General Appearance:    Alert, cooperative, no distress, appears stated age - yes , Deconditioned looking - n , OBESE  - visceralb, Sitting on Wheelchair -  no  Head:    Normocephalic, without obvious abnormality, atraumatic  Eyes:    PERRL, conjunctiva/corneas clear,  Ears:    Normal TM's and external ear canals, both ears  Nose:   Nares normal, septum midline, mucosa normal, no drainage    or sinus tenderness. OXYGEN ON  - no . Patient is @ ra   Throat:   Lips, mucosa, and tongue normal; teeth and gums normal. Cyanosis on lips - no  Neck:   Supple, symmetrical, trachea midline, no adenopathy;    thyroid:  no enlargement/tenderness/nodules; no carotid   bruit or JVD  Back:     Symmetric, no curvature, ROM normal, no CVA tenderness  Lungs:     Distress - no , Wheeze nCTo, Barrell  Chest - no, Purse lip breathing - no, Crackles - no   Chest Wall:    No tenderness or deformity.    Heart:    Regular rate and rhythm, S1 and S2 normal, no rub   or gallop, Murmur - no  Breast Exam:    NOT DONE  Abdomen:     Soft, non-tender, bowel sounds active all four quadrants,    no masses, no organomegaly. Visceral obesity - YES  Genitalia:   NOT DONE  Rectal:   NOT DONE  Extremities:   Extremities - normal, Has Cane - no, Clubbing - no, Edema - no  Pulses:   2+ and symmetric all extremities  Skin:   Stigmata of Connective Tissue Disease - Mild stiff fingers  Lymph nodes:   Cervical, supraclavicular, and axillary nodes normal  Psychiatric:  Neurologic:   Pleasant - yes, Anxious - no, Flat affect - no  CAm-ICU - neg, Alert and Oriented x 3 - yes, Moves all 4s - yes, Speech - normal, Cognition - intact           Assessment:       ICD-10-CM   1. Moderate COPD (chronic obstructive pulmonary disease) (HCC) J44.9   2. Dyspnea on exertion R06.09   3. Cancer screening Z12.9   4. Stopped smoking with greater than 40 pack year history Z87.891   5. Tobacco use Z72.0 CT Chest Wo Contrast  Plan:     Patient Instructions  Moderate COPD (chronic obstructive pulmonary disease) ([place])  - stable - continue breo and incruse daily; glad is helping - albuterol as needed - Please talk to PCP Mark Sites, MD -  and ensure you get  shingrix ([place]) inactivated vaccine against shingles   Dyspnea on exertion - due to several reasons from copd and weight and fitness - glad rehab helped - when possible [facility]  Cancer screening Smoking history > 60 pack  - do followup low dose CT chest (last one was sept 2018) - next few wweeks  Followup - 6 months or sooner if needed      SIGNATURE    Dr. Brand Males, M.D., F.C.C.P,  Pulmonary and Critical Care Medicine Staff Physician, [place] Director - Interstitial  Lung Disease  Program  P[facility] [place] at [place], [place], [address]  Pager: [phone], If no answer or between  15:00h - 7:00h: call [phone] Telephone: [phone]  9:34 AM 12/02/2018

## 2018-12-04 ENCOUNTER — Ambulatory Visit (HOSPITAL_COMMUNITY)
Admission: RE | Admit: 2018-12-04 | Discharge: 2018-12-04 | Disposition: A | Discharge status: Home / Self Care | Payer: Medicare Other | Source: Ambulatory Visit | Attending: Internal Medicine | Admitting: Internal Medicine

## 2018-12-04 DIAGNOSIS — I2510 Atherosclerotic heart disease of native coronary artery without angina pectoris: Secondary | ICD-10-CM | POA: Diagnosis not present

## 2018-12-04 DIAGNOSIS — J479 Bronchiectasis, uncomplicated: Secondary | ICD-10-CM | POA: Insufficient documentation

## 2018-12-04 DIAGNOSIS — J439 Emphysema, unspecified: Secondary | ICD-10-CM | POA: Insufficient documentation

## 2018-12-04 DIAGNOSIS — J9809 Other diseases of bronchus, not elsewhere classified: Secondary | ICD-10-CM | POA: Diagnosis not present

## 2018-12-04 DIAGNOSIS — Z87891 Personal history of nicotine dependence: Secondary | ICD-10-CM | POA: Insufficient documentation

## 2018-12-04 DIAGNOSIS — Z720 Tobacco use: Secondary | ICD-10-CM

## 2018-12-09 ENCOUNTER — Other Ambulatory Visit: Payer: Self-pay | Admitting: Rheumatology

## 2018-12-09 DIAGNOSIS — C61 Malignant neoplasm of prostate: Secondary | ICD-10-CM | POA: Diagnosis not present

## 2018-12-10 NOTE — Telephone Encounter (Signed)
Last Visit: 10/21/18 Next visit: 02/03/19  Okay to refill per Dr. Estanislado Pandy

## 2018-12-11 ENCOUNTER — Other Ambulatory Visit: Payer: Self-pay | Admitting: Rheumatology

## 2018-12-11 NOTE — Telephone Encounter (Signed)
Last Visit: 10/21/18 Next visit: 02/03/19  Okay to refill per Dr. Estanislado Pandy

## 2018-12-13 ENCOUNTER — Other Ambulatory Visit: Payer: Self-pay | Admitting: Rheumatology

## 2018-12-14 ENCOUNTER — Telehealth: Payer: Self-pay | Admitting: Pharmacy Technician

## 2018-12-14 NOTE — Telephone Encounter (Signed)
Last Visit:10/21/18 Next visit: 02/03/19 Labs: 11/30/18 Creat. 1.25 GFR 59 Glucose 114  Okay to refill per Dr. Estanislado Pandy

## 2018-12-14 NOTE — Telephone Encounter (Signed)
Called the patient to verify benefits for 2020. Pt currently received infusions that may require a pre-certification. Patient states that his insurance plans will not change for 2020. Medicare covers 80% of the infusion and no authorization is required, and the [employer] supplement would cover the 20% of the cost that was not paid for by Medicare as long as Medicare covered the medication.    11:26 AM Beatriz Chancellor, CPhT

## 2018-12-16 ENCOUNTER — Ambulatory Visit (INDEPENDENT_AMBULATORY_CARE_PROVIDER_SITE_OTHER): Payer: Medicare Other | Admitting: Urology

## 2018-12-16 DIAGNOSIS — R310 Gross hematuria: Secondary | ICD-10-CM | POA: Diagnosis not present

## 2018-12-16 DIAGNOSIS — C61 Malignant neoplasm of prostate: Secondary | ICD-10-CM

## 2018-12-16 DIAGNOSIS — R3915 Urgency of urination: Secondary | ICD-10-CM | POA: Diagnosis not present

## 2018-12-24 ENCOUNTER — Other Ambulatory Visit: Payer: Self-pay | Admitting: *Deleted

## 2018-12-24 NOTE — Progress Notes (Signed)
Infusion orders are current for patient CBC CMP Tylenol Benadryl appointments are up to date and follow up appointment  is scheduled TB gold not due yet.  

## 2018-12-25 ENCOUNTER — Telehealth: Payer: Self-pay | Admitting: Internal Medicine

## 2018-12-25 NOTE — Telephone Encounter (Signed)
Called and spoke with pt regarding samples of Breo and Incruse Inhalers He is wanting samples instead of picking them up to pay for them at the pharmacy Advised pt that samples for these inhalers are for new patient starts, That if a refill from the pharmacy is needed to please let us know He stated that he has the refills at the pharmacy, he will p/u later next week  Advised pt that at this time MR has not review his CT chest scan from 12/07/18 Once it is review, nurse or MR will call regarding the results. Pt verbalized understanding, and had no further questions Nothing further needed at this time.

## 2018-12-27 ENCOUNTER — Other Ambulatory Visit: Payer: Self-pay | Admitting: Rheumatology

## 2018-12-28 ENCOUNTER — Telehealth: Payer: Self-pay | Admitting: Internal Medicine

## 2018-12-28 ENCOUNTER — Ambulatory Visit (HOSPITAL_COMMUNITY)
Admission: RE | Admit: 2018-12-28 | Discharge: 2018-12-28 | Disposition: A | Discharge status: Home / Self Care | Payer: Medicare Other | Source: Ambulatory Visit | Attending: Rheumatology | Admitting: Rheumatology

## 2018-12-28 DIAGNOSIS — M0579 Rheumatoid arthritis with rheumatoid factor of multiple sites without organ or systems involvement: Secondary | ICD-10-CM | POA: Diagnosis not present

## 2018-12-28 MED ORDER — DIPHENHYDRAMINE HCL 25 MG PO CAPS: 25.0000 mg | ORAL_CAPSULE | ORAL | Status: DC

## 2018-12-28 MED ORDER — TOCILIZUMAB 400 MG/20ML IV SOLN
4.0000 mg/kg | INTRAVENOUS | Status: DC
  Administered 2018-12-28: 400 mg via INTRAVENOUS
  Filled 2018-12-28: qty 20

## 2018-12-28 MED ORDER — ACETAMINOPHEN 325 MG PO TABS: 650.0000 mg | ORAL_TABLET | ORAL | Status: DC

## 2018-12-28 NOTE — Telephone Encounter (Signed)
Called and spoke with patient, he is requesting samples of Breo. Advised patient that we do not have any samples at this time but he could call back at the end of the week and we may have some. Patient verbalized understanding. Nothing further needed.

## 2018-12-28 NOTE — Telephone Encounter (Signed)
Last Visit:10/21/18 Next visit: 02/03/19 Labs: 11/30/18 Creat. 1.25 GFR 59 Glucose 114  Okay to refill per Dr. Estanislado Pandy

## 2018-12-29 ENCOUNTER — Other Ambulatory Visit (HOSPITAL_COMMUNITY): Payer: Self-pay | Admitting: Urology

## 2018-12-29 ENCOUNTER — Other Ambulatory Visit: Payer: Self-pay | Admitting: Urology

## 2018-12-29 DIAGNOSIS — R310 Gross hematuria: Secondary | ICD-10-CM

## 2019-01-06 ENCOUNTER — Telehealth: Payer: Self-pay | Admitting: Internal Medicine

## 2019-01-06 NOTE — Telephone Encounter (Signed)
lmtcb

## 2019-01-06 NOTE — Telephone Encounter (Signed)
Spoke with the pt  I advised we do not have any samples of Breo at this time  He states he can not afford the 45$ copay  Wants to know if he can try a sample of another inhaler that would be comparable  Please advise thanks

## 2019-01-06 NOTE — Telephone Encounter (Signed)
I am not sure that cheaper than $45/mo exists unless he is medicaid. OR he should call his insurance (maybe triage can help) and see if 90 day supply  Helps lower cost. He can also see if there is anything else his insurance offers that is cheaper - breo equivalents are air duo, advair, symbicort, dulera

## 2019-01-08 NOTE — Telephone Encounter (Signed)
Attempted to call pt but unable to reach him. Left  Message for pt to return call.

## 2019-01-08 NOTE — Telephone Encounter (Signed)
Pt is calling back [phone]

## 2019-01-08 NOTE — Telephone Encounter (Signed)
Left message for him to call back.  

## 2019-01-11 NOTE — Telephone Encounter (Signed)
Called and spoke with Patient's Wife, Bethena Roys (on [place]). Advised her that we still do not have Breo 100 samples at this time, but they can call his insurance company, and see if there is a preferred inhaler, that may be cheaper for him then th Breo. She stated that she would call once they have called insurance.

## 2019-01-13 ENCOUNTER — Ambulatory Visit (HOSPITAL_COMMUNITY)
Admission: RE | Admit: 2019-01-13 | Discharge: 2019-01-13 | Disposition: A | Discharge status: Home / Self Care | Payer: Medicare Other | Source: Ambulatory Visit | Attending: Urology | Admitting: Urology

## 2019-01-13 DIAGNOSIS — R310 Gross hematuria: Secondary | ICD-10-CM | POA: Diagnosis not present

## 2019-01-13 DIAGNOSIS — K5730 Diverticulosis of large intestine without perforation or abscess without bleeding: Secondary | ICD-10-CM | POA: Diagnosis not present

## 2019-01-13 LAB — POCT I-STAT CREATININE: Creatinine, Ser: 1.4 mg/dL — ABNORMAL HIGH (ref 0.61–1.24)

## 2019-01-13 MED ORDER — IOPAMIDOL (ISOVUE-300) INJECTION 61%
150.0000 mL | Freq: Once | INTRAVENOUS | Status: AC | PRN
  Administered 2019-01-13: 150 mL via INTRAVENOUS

## 2019-01-13 NOTE — Telephone Encounter (Signed)
Attempted to contact pt. Call went straight to voicemail. I have left a message for pt to return our call.  

## 2019-01-14 ENCOUNTER — Telehealth: Payer: Self-pay | Admitting: Internal Medicine

## 2019-01-14 MED ORDER — FLUTICASONE FUROATE-VILANTEROL 100-25 MCG/INH IN AEPB: 1.0000 | INHALATION_SPRAY | Freq: Every day | RESPIRATORY_TRACT | 1 refills | Status: DC

## 2019-01-14 MED ORDER — FLUTICASONE FUROATE-VILANTEROL 100-25 MCG/INH IN AEPB: 1.0000 | INHALATION_SPRAY | Freq: Every day | RESPIRATORY_TRACT | 0 refills | Status: DC

## 2019-01-14 MED ORDER — UMECLIDINIUM BROMIDE 62.5 MCG/INH IN AEPB: 1.0000 | INHALATION_SPRAY | Freq: Every day | RESPIRATORY_TRACT | 1 refills | Status: DC

## 2019-01-14 NOTE — Telephone Encounter (Signed)
Spoke with pt, he wanted to give [place] the number to [place] but I advised him that Magda Paganini called and figured out which [facility] to use. Pt understood and nothing further is needed.

## 2019-01-14 NOTE — Telephone Encounter (Signed)
Called and spoke with Patient.  He was requesting a sample of Breo 100. Patient stated that he was going to call his insurance company and give [place] the inhaler list of preferred inhalers. Explained to call our office once he knows preferred inhalers.  Breo 100 sample placed up front for pick up.  Nothing further at this time.

## 2019-01-14 NOTE — Telephone Encounter (Signed)
Spoke with the pt to verify the msg  I have sent rxs for 90 day supply  Nothing further needed

## 2019-01-14 NOTE — Telephone Encounter (Signed)
Attempted to contact pt. Call went straight to voicemail. I have left a message for pt to return our call.  

## 2019-01-14 NOTE — Telephone Encounter (Signed)
Patient is returning phone call.  Phone number is [phone].  Cell phone is [phone].

## 2019-01-18 ENCOUNTER — Telehealth: Payer: Self-pay | Admitting: Internal Medicine

## 2019-01-18 NOTE — Telephone Encounter (Signed)
CT ches dec 2019 shows  1. Emphysema  2. Possible scar tissue right lung base and more prominent then prior -> THERFORE PLEASE HAVE HIM DO SpIRO pre and post bd and dlco before visit 01/25/2019   2.  does have coronary artery calcification- will address at 01/25/2001 visit   .................................................. IMPRESSION: 1. No acute cardiopulmonary abnormalities. 2. Diffuse bronchial wall thickening with emphysema, as above; imaging findings suggestive of underlying COPD. 3. There is peripheral subpleural reticulation identified within the right lung which appears slightly progressive compared with previous study. There is mild bronchiectasis. 4. Multi vessel coronary artery atherosclerotic calcifications.   Electronically Signed   By: Kerby Moors M.D.   On: 12/07/2018 10:23

## 2019-01-20 ENCOUNTER — Telehealth: Payer: Self-pay | Admitting: Internal Medicine

## 2019-01-20 MED ORDER — FLUTICASONE FUROATE-VILANTEROL 100-25 MCG/INH IN AEPB: 1.0000 | INHALATION_SPRAY | Freq: Every day | RESPIRATORY_TRACT | 1 refills | Status: DC

## 2019-01-20 MED ORDER — UMECLIDINIUM BROMIDE 62.5 MCG/INH IN AEPB: 1.0000 | INHALATION_SPRAY | Freq: Every day | RESPIRATORY_TRACT | 1 refills | Status: DC

## 2019-01-20 NOTE — Progress Notes (Signed)
Office Visit Note  Patient: [name]             Date of Birth: July 19, 1951           MRN: [id number]             PCP: [name], MD Referring: [name], MD Visit Date: 02/03/2019 Occupation: @GUAROCC @  Subjective:  Joint stiffness.   History of Present Illness: [name] is a 68 y.o. male history of seropositive rheumatoid arthritis, gout and osteoarthritis.  He has been on Actemra IV and Arava combination which has been working quite well for him.  He denies any joint swelling.  He continues to have some stiffness in his joints.  He states he had some stiffness in his cervical spine about 1-1/2 weeks ago and some stiffness in his right hand today.  He has been also taking fish oil which helps some as well.  Activities of Daily Living:  Patient reports morning stiffness for 10 minutes.   Patient Denies nocturnal pain.  Difficulty dressing/grooming: Denies Difficulty climbing stairs: Denies Difficulty getting out of chair: Denies Difficulty using hands for taps, buttons, cutlery, and/or writing: Denies  Review of Systems  Constitutional: Positive for fatigue. Negative for night sweats.  HENT: Negative for mouth sores, mouth dryness and nose dryness.   Eyes: Negative for redness, itching and dryness.  Respiratory: Positive for difficulty breathing. Negative for shortness of breath.        Due to COPD   Cardiovascular: Negative for chest pain, palpitations, hypertension, irregular heartbeat and swelling in legs/feet.  Gastrointestinal: Negative for abdominal pain, constipation and diarrhea.  Endocrine: Negative for increased urination.  Genitourinary: Negative for painful urination, pelvic pain and urgency.  Musculoskeletal: Positive for arthralgias, joint pain and morning stiffness. Negative for joint swelling, myalgias, muscle weakness, muscle tenderness and myalgias.  Skin: Negative for color change, rash, hair loss, nodules/bumps, skin tightness, ulcers  and sensitivity to sunlight.  Allergic/Immunologic: Negative for susceptible to infections.  Neurological: Positive for weakness. Negative for dizziness, fainting, light-headedness, headaches, memory loss and night sweats.  Hematological: Positive for bruising/bleeding tendency. Negative for swollen glands.  Psychiatric/Behavioral: Negative for depressed mood, confusion and sleep disturbance. The patient is not nervous/anxious.     PMFS History:  Patient Active Problem List   Diagnosis Date Noted  . Dyspnea on exertion 10/22/2017  . History of seizure disorder 02/27/2017  . Primary osteoarthritis of both hands 02/27/2017  . Primary osteoarthritis of both feet 02/27/2017  . Idiopathic gout of multiple sites 02/27/2017  . High risk medication use 12/29/2016  . History of prostate cancer 12/29/2016  . Primary osteoarthritis of both knees 12/29/2016  . Chronic idiopathic gout involving toe without tophus 12/29/2016  . History of CHF (congestive heart failure) 12/29/2016  . History of COPD 12/29/2016  . Plantar pustular psoriasis 12/29/2016  . DVT (deep venous thrombosis) ([place]) 10/31/2016  . Chronic obstructive pulmonary disease ([place]) 10/31/2016  . CHF (congestive heart failure) ([place]) 10/31/2016  . Prostate cancer ([place]) 08/30/2015  . Malignant neoplasm of prostate ([place]) 07/04/2015  . Chest pain at rest 07/05/2014  . Weakness 07/05/2014  . Complicated postphlebitic syndrome 11/16/2013  . Personal history of DVT (deep vein thrombosis) 05/18/2013  . Chronic anticoagulation 05/18/2013  . Diverticulosis of colon without hemorrhage 05/18/2013  . Rheumatoid arthritis ([place]) 05/18/2013  . Seizure disorder ([place]) 05/18/2013  . Hx of adenomatous colonic polyps 05/18/2013  . GERD 05/08/2010  . NAUSEA 05/08/2010  . FLATULENCE-GAS-BLOATING 05/08/2010  Past Medical History:  Diagnosis Date  . Anxiety    hx of   . Arthritis   . Asthma   . Clotting disorder (HCC)    DVT both legs   . COPD  (chronic obstructive pulmonary disease) ([place])   . DDD (degenerative disc disease), cervical    with UE's paresthesias  . Diverticulosis   . DVT, lower extremity ([place])    bilat  . 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 GERD (gastroesophageal reflux disease)   . Gout   . Heart murmur   . History of measles   . History of shingles   . Hypercholesterolemia   . IBS (irritable bowel syndrome)   . IBS (irritable bowel syndrome)   . Peripheral edema   . Pneumonia    hx of   . Prostate cancer ([place])   . Seizures ([place])    last seizure 20 years ago   . Shortness of breath dyspnea    exertion   . Sleep apnea    not on cpap  . Tubular adenoma of colon 04/2008    Family History  Problem Relation Age of Onset  . Skin cancer Father   . Stomach cancer Father   . Heart attack Father   . Alzheimer's disease Mother   . Throat cancer Brother   . Stomach cancer Brother   . Lung cancer Brother   . Heart attack Brother   . Heart attack Brother   . Breast cancer Sister   . Heart attack Sister   . Stroke Sister   . Bladder Cancer Sister   . Heart murmur Sister   . Colon cancer Brother   . Colon polyps Neg Hx    Past Surgical History:  Procedure Laterality Date  . CHOLECYSTECTOMY    . COLONOSCOPY    . DOPPLER ECHOCARDIOGRAPHY N/A 01-21-2012   TECHNICALLY DIFFICULT. MILD CONCENTRIC LV HYPERTROPHY. LV CAVITY IS SMALL.. EF=> 55%. TRANSMITRAL SPECTRAL FLOW PATTREN IS SUGGESTIVE OF IMPAIRED LV RELAXATION. RV SYSTOLIC PRESSURE IS 86PYPP. LEFT ATRIAL SIZE IS NORMAL. AV APPEARS MILDLY SCLEROTIC. NO SIGN VALVE DISEASE NOTED.  [name] LYMPHADENECTOMY Bilateral 08/30/2015   Procedure: PELVIC LYMPHADENECTOMY;  Surgeon: [name], MD;  Location: [facility];  Service: Urology;  Laterality: Bilateral;  . NUCLEAR STRESS TEST N/A 01-21-2012   NORMAL PATTERN OF PERFUSION IN ALL REGIONS. POST STRESS LV SIZE IS NORMAL. NO EVIDENCE OF INDUCIBLE ISCHEMIA.  EF 54%.  [name] POLYPECTOMY    . PROSTATE BIOPSY    . ROBOT ASSISTED LAPAROSCOPIC RADICAL PROSTATECTOMY N/A 08/30/2015   Procedure: ROBOTIC ASSISTED LAPAROSCOPIC RADICAL PROSTATECTOMY;  Surgeon: [name], MD;  Location: [facility];  Service: Urology;  Laterality: N/A;  . US VENOUS LOWER EXT Right 03/07/11   PERSISTENT DVT IN RIGHT LOWER EXT.WITH PERSISTANT VISUALIZATION OF HYPOECHOIC THROMBUS WITHIN THE FEMORAL, PROFUNDA FEMORAL AND POPLITEAL VEINS. WHEN COMPARED TO PREVIOUS, CLOT IS NO LONGER IDENTIFIED WITH IN THE RIGHT CFV.   Social History   Social History Narrative  . Not on file   Immunization History  Administered Date(s) Administered  . Influenza, High Dose Seasonal PF 10/05/2018  . Influenza,inj,Quad PF,6+ Mos 08/31/2015  . Influenza-Unspecified 09/30/2016, 08/22/2017  . Pneumococcal Conjugate-13 10/05/2018  . Pneumococcal-Unspecified 12/30/2012     Objective: Vital Signs: BP 132/77 (BP Location: Left Arm, Patient Position: Sitting, Cuff Size: Normal)   Pulse 73   Resp 15   Ht 5\' 8"  (  1.727 m)   Wt 221 lb (100.2 kg)   BMI 33.60 kg/m    Physical Exam Vitals signs and nursing note reviewed.  Constitutional:      Appearance: He is well-developed.  HENT:     Head: Normocephalic and atraumatic.  Eyes:     Conjunctiva/sclera: Conjunctivae normal.     Pupils: Pupils are equal, round, and reactive to light.  Neck:     Musculoskeletal: Normal range of motion and neck supple.  Cardiovascular:     Rate and Rhythm: Normal rate and regular rhythm.     Heart sounds: Normal heart sounds.     Comments: Bilateral pedal edema was noted. Pulmonary:     Effort: Pulmonary effort is normal.     Breath sounds: Normal breath sounds.  Abdominal:     General: Bowel sounds are normal.     Palpations: Abdomen is soft.  Skin:    General: Skin is warm and dry.     Capillary Refill: Capillary refill takes less than 2 seconds.  Neurological:     Mental Status: He is alert and oriented  to person, place, and time.  Psychiatric:        Behavior: Behavior normal.      Musculoskeletal Exam: C-spine thoracic lumbar spine good range of motion.  Shoulder joints elbow joints were in good range of motion.  He has limitation of range of motion of his wrist joints.  Synovial thickening noted over MCP joints.  PIP and DIP joint thickening was noted.  No synovitis was noted.  Hip joints, knee joints, ankles were in good range of motion.  CDAI Exam: CDAI Score: 0.2  Patient Global Assessment: 1 (mm); Provider Global Assessment: 1 (mm) Swollen: 0 ; Tender: 0  Joint Exam   Not documented   There is currently no information documented on the homunculus. Go to the Rheumatology activity and complete the homunculus joint exam.  Investigation: No additional findings.  Imaging: Ct Abdomen Pelvis W Wo Contrast  Result Date: 01/13/2019 CLINICAL DATA:  Gross hematuria. History of colon cancer, prostate cancer status post surgery and radiation therapy EXAM: CT ABDOMEN AND PELVIS WITHOUT AND WITH CONTRAST TECHNIQUE: Multidetector CT imaging of the abdomen and pelvis was performed following the standard protocol before and following the bolus administration of intravenous contrast. CONTRAST:  166mL ISOVUE-300 IOPAMIDOL (ISOVUE-300) INJECTION 61% COMPARISON:  PET-CT dated 11/28/2008 FINDINGS: Lower chest: Eventration of the left hemidiaphragm with associated left basilar scarring/atelectasis. Mild subpleural scarring/atelectasis in the right lower lobe. Hepatobiliary: Liver is within normal limits. Status post cholecystectomy. No intrahepatic or extrahepatic ductal dilatation. Pancreas: Within normal limits. Spleen: Within normal limits. Adrenals/Urinary Tract: Adrenal glands are within normal limits. Kidneys are within normal limits. No renal, ureteral, or bladder calculi. No hydronephrosis. No enhancing renal lesions. Thick-walled bladder, although underdistended. Stomach/Bowel: Stomach is within  normal limits. No evidence of bowel obstruction. Normal appendix (series 3/image 63). Left colonic diverticulosis, without evidence of diverticulitis. Vascular/Lymphatic: No evidence of abdominal aortic aneurysm. Atherosclerotic calcifications of the abdominal aorta and branch vessels. No suspicious abdominopelvic lymphadenopathy. Reproductive: Status post prostatectomy. Other: No abdominopelvic ascites. Musculoskeletal: Visualized osseous structures are within normal limits. No focal osseous lesions. IMPRESSION: No renal, ureteral, or bladder calculi.  No hydronephrosis. No enhancing renal lesions. Thick-walled bladder, possibly reflecting radiation changes. Status post prostatectomy. No evidence of recurrent or metastatic disease. Electronically Signed   By: Julian Hy M.D.   On: 01/13/2019 19:48   Xr Foot 2 Views Left  Result Date:  02/03/2019 First MTP, all PIP and DIP narrowing was noted.  No intertarsal, tibiotalar subtalar joint space narrowing was noted.  Small calcaneal spur was noted.  Juxta-articular osteopenia was noted.  No erosive changes were noted. Impression: These findings are consistent with rheumatoid arthritis and osteoarthritis overlap.  Xr Foot 2 Views Right  Result Date: 02/03/2019 First MTP, all PIP and DIP narrowing was noted.  No intertarsal, tibiotalar subtalar joint space narrowing was noted.  Small calcaneal spur was noted.  Juxta-articular osteopenia was noted.  No erosive changes were noted. Impression: These findings are consistent with rheumatoid arthritis and osteoarthritis overlap.   Recent Labs: Lab Results  Component Value Date   WBC 5.7 11/29/2018   HGB 13.8 11/29/2018   PLT 174 11/29/2018   NA 140 11/30/2018   K 3.9 11/30/2018   CL 100 11/30/2018   CO2 31 11/30/2018   GLUCOSE 114 (H) 11/30/2018   BUN 9 11/30/2018   CREATININE 1.40 (H) 01/13/2019   BILITOT 0.8 11/30/2018   ALKPHOS 51 11/30/2018   AST 31 11/30/2018   ALT 30 11/30/2018   PROT 6.2  (L) 11/30/2018   ALBUMIN 3.7 11/30/2018   CALCIUM 9.2 11/30/2018   GFRAA >60 11/30/2018   QFTBGOLDPLUS Indeterminate 01/26/2018    Speciality Comments: ACTEMRA 4mg /kg x 4 weeks PPD negative 01/02/17  Procedures:  No procedures performed Allergies: Orencia [abatacept]; Carbamazepine; Celecoxib; Cephalexin; Enbrel [etanercept]; Humira [adalimumab]; Levofloxacin; Sulfa antibiotics; and Sulfasalazine   Assessment / Plan:     Visit Diagnoses: Rheumatoid arthritis involving multiple sites with positive rheumatoid factor ([place]) -patient is doing clinically very well on Actemra and Arava combination.  He had no synovitis on examination.  He has been off prednisone for over 6 months now.  He experiences some joint stiffness.  The x-ray of bilateral feet obtained today were consistent with osteoarthritis and rheumatoid arthritis overlap.  No radiographic progression was noted. - Plan: XR Foot 2 Views Right, XR Foot 2 Views Left  High risk medication use - Actemra IV (last dose 10/05/18), Arava 20 mg.  He has mild elevation of creatinine which could be due to use of diuretics.  We will check TB Gold with his next infusion.  Chronic idiopathic gout involving toe without tophus, unspecified laterality - allopurinol 200 mg by mouth daily.uric acid: 03/18/2018 7.3.  We will check uric acid with his next infusion.  Primary osteoarthritis of both hands-joint protection muscle strengthening was discussed.  Primary osteoarthritis of both knees-he is currently not having much discomfort.  Weight loss diet and exercise was discussed.  Primary osteoarthritis of both feet - Plan: XR Foot 2 Views Right, XR Foot 2 Views Left  Age-related osteoporosis without current pathological fracture - He is on Fosamax 70mg  weekly (started 09/29/17). 09/24/2017 DXA T score -2.60, BMD 0.571 right femoral neck.  History of obesity-weight loss and dietary modification discussed.  History of DVT (deep vein thrombosis)  History of  CHF (congestive heart failure)-he has bilateral pedal edema.  He will follow-up with his PCP and cardiologist.  History of adenomatous polyp of colon  History of prostate cancer  History of COPD  History of seizure disorder   Orders: Orders Placed This Encounter  Procedures  . XR Foot 2 Views Right  . XR Foot 2 Views Left   No orders of the defined types were placed in this encounter.   Association of heart disease with rheumatoid arthritis was discussed. Need to monitor blood pressure, cholesterol, and to exercise 30-60 minutes on daily  basis was discussed. Poor dental hygiene can be a predisposing factor for rheumatoid arthritis. Good dental hygiene was discussed.  Follow-Up Instructions: Return in about 5 months (around 07/04/2019) for Rheumatoid arthritis.   Bo Merino, MD  Note - This record has been created using Editor, commissioning.  Chart creation errors have been sought, but may not always  have been located. Such creation errors do not reflect on  the standard of medical care.

## 2019-01-20 NOTE — Telephone Encounter (Signed)
Called and spoke with patient he stated that he needed a 90 day supply of both the breo and incruse sent to his pharmacy. I advised patient that this was sent in on 01/14/2019. Patient asked to which pharmacy. Advised it was sent to [facility] in [place]. Patient stated that is not his pharmacy and he needed it sent to [facility]. Apologized to patient and have sent these in. Nothing further needed.

## 2019-01-21 ENCOUNTER — Other Ambulatory Visit: Payer: Self-pay | Admitting: Physician Assistant

## 2019-01-21 ENCOUNTER — Other Ambulatory Visit: Payer: Self-pay | Admitting: Emergency Medicine

## 2019-01-21 NOTE — Telephone Encounter (Signed)
Called and spoke with pt letting him know the results of the CT and stated to him based on the results, MR wants him to have a PFT with OV prior. Pt expressed understanding. appts have been scheduled for pt. Nothing further needed.

## 2019-01-21 NOTE — Telephone Encounter (Signed)
Last Visit:10/21/18 Next visit: 02/03/19  Okay to refill per Dr. Estanislado Pandy

## 2019-01-22 ENCOUNTER — Other Ambulatory Visit: Payer: Self-pay | Admitting: *Deleted

## 2019-01-22 NOTE — Progress Notes (Signed)
Infusion orders are current for patient CBC CMP Tylenol Benadryl appointments are up to date and follow up appointment  is scheduled TB gold due and order placed.  

## 2019-01-25 ENCOUNTER — Ambulatory Visit (HOSPITAL_COMMUNITY)
Admission: RE | Admit: 2019-01-25 | Discharge: 2019-01-25 | Disposition: A | Discharge status: Home / Self Care | Payer: Medicare Other | Source: Ambulatory Visit | Attending: Interventional Radiology | Admitting: Interventional Radiology

## 2019-01-25 DIAGNOSIS — M0579 Rheumatoid arthritis with rheumatoid factor of multiple sites without organ or systems involvement: Secondary | ICD-10-CM | POA: Diagnosis not present

## 2019-01-25 MED ORDER — TOCILIZUMAB 400 MG/20ML IV SOLN
4.0000 mg/kg | INTRAVENOUS | Status: DC
  Administered 2019-01-25: 400 mg via INTRAVENOUS
  Filled 2019-01-25: qty 20

## 2019-01-25 MED ORDER — DIPHENHYDRAMINE HCL 25 MG PO CAPS: 25.0000 mg | ORAL_CAPSULE | ORAL | Status: DC

## 2019-01-25 MED ORDER — ACETAMINOPHEN 325 MG PO TABS: 650.0000 mg | ORAL_TABLET | ORAL | Status: DC

## 2019-02-03 ENCOUNTER — Encounter: Payer: Self-pay | Admitting: Rheumatology

## 2019-02-03 ENCOUNTER — Ambulatory Visit (INDEPENDENT_AMBULATORY_CARE_PROVIDER_SITE_OTHER): Payer: Medicare Other | Admitting: Rheumatology

## 2019-02-03 ENCOUNTER — Ambulatory Visit (INDEPENDENT_AMBULATORY_CARE_PROVIDER_SITE_OTHER): Payer: Medicare Other

## 2019-02-03 VITALS — BP 132/77 | HR 73 | Resp 15 | Ht 68.0 in | Wt 221.0 lb

## 2019-02-03 DIAGNOSIS — I82409 Acute embolism and thrombosis of unspecified deep veins of unspecified lower extremity: Secondary | ICD-10-CM | POA: Diagnosis not present

## 2019-02-03 DIAGNOSIS — M19042 Primary osteoarthritis, left hand: Secondary | ICD-10-CM

## 2019-02-03 DIAGNOSIS — M19071 Primary osteoarthritis, right ankle and foot: Secondary | ICD-10-CM

## 2019-02-03 DIAGNOSIS — Z8639 Personal history of other endocrine, nutritional and metabolic disease: Secondary | ICD-10-CM | POA: Diagnosis not present

## 2019-02-03 DIAGNOSIS — Z79899 Other long term (current) drug therapy: Secondary | ICD-10-CM

## 2019-02-03 DIAGNOSIS — M0579 Rheumatoid arthritis with rheumatoid factor of multiple sites without organ or systems involvement: Secondary | ICD-10-CM

## 2019-02-03 DIAGNOSIS — M19072 Primary osteoarthritis, left ankle and foot: Secondary | ICD-10-CM

## 2019-02-03 DIAGNOSIS — M19041 Primary osteoarthritis, right hand: Secondary | ICD-10-CM

## 2019-02-03 DIAGNOSIS — M170 Bilateral primary osteoarthritis of knee: Secondary | ICD-10-CM | POA: Diagnosis not present

## 2019-02-03 DIAGNOSIS — M810 Age-related osteoporosis without current pathological fracture: Secondary | ICD-10-CM

## 2019-02-03 DIAGNOSIS — M1A0790 Idiopathic chronic gout, unspecified ankle and foot, without tophus (tophi): Secondary | ICD-10-CM

## 2019-02-03 DIAGNOSIS — Z86010 Personal history of colonic polyps: Secondary | ICD-10-CM | POA: Diagnosis not present

## 2019-02-03 DIAGNOSIS — Z8546 Personal history of malignant neoplasm of prostate: Secondary | ICD-10-CM | POA: Diagnosis not present

## 2019-02-03 DIAGNOSIS — Z8709 Personal history of other diseases of the respiratory system: Secondary | ICD-10-CM

## 2019-02-03 DIAGNOSIS — Z86718 Personal history of other venous thrombosis and embolism: Secondary | ICD-10-CM

## 2019-02-03 DIAGNOSIS — Z8679 Personal history of other diseases of the circulatory system: Secondary | ICD-10-CM | POA: Diagnosis not present

## 2019-02-03 DIAGNOSIS — Z8669 Personal history of other diseases of the nervous system and sense organs: Secondary | ICD-10-CM

## 2019-02-04 ENCOUNTER — Other Ambulatory Visit: Payer: Self-pay | Admitting: Internal Medicine

## 2019-02-05 ENCOUNTER — Other Ambulatory Visit: Payer: Self-pay | Admitting: Internal Medicine

## 2019-02-05 DIAGNOSIS — J449 Chronic obstructive pulmonary disease, unspecified: Secondary | ICD-10-CM

## 2019-02-08 ENCOUNTER — Encounter: Payer: Self-pay | Admitting: Primary Care

## 2019-02-08 ENCOUNTER — Ambulatory Visit (INDEPENDENT_AMBULATORY_CARE_PROVIDER_SITE_OTHER): Payer: Medicare Other | Admitting: Internal Medicine

## 2019-02-08 ENCOUNTER — Ambulatory Visit (INDEPENDENT_AMBULATORY_CARE_PROVIDER_SITE_OTHER): Payer: Medicare Other | Admitting: Primary Care

## 2019-02-08 VITALS — BP 132/70 | HR 76 | Ht 69.0 in | Wt 226.0 lb

## 2019-02-08 DIAGNOSIS — R9389 Abnormal findings on diagnostic imaging of other specified body structures: Secondary | ICD-10-CM | POA: Diagnosis not present

## 2019-02-08 DIAGNOSIS — J449 Chronic obstructive pulmonary disease, unspecified: Secondary | ICD-10-CM | POA: Diagnosis not present

## 2019-02-08 DIAGNOSIS — R0609 Other forms of dyspnea: Secondary | ICD-10-CM

## 2019-02-08 DIAGNOSIS — R0600 Dyspnea, unspecified: Secondary | ICD-10-CM

## 2019-02-08 LAB — PULMONARY FUNCTION TEST
DL/VA % pred: 115 %
DL/VA: 4.75 ml/min/mmHg/L
DLCO unc % pred: 71 %
DLCO unc: 18.1 ml/min/mmHg
FEF 25-75 Post: 2.91 L/sec
FEF 25-75 Pre: 1.39 L/sec
FEF2575-%Change-Post: 109 %
FEF2575-%Pred-Post: 117 %
FEF2575-%Pred-Pre: 55 %
FEV1-%Change-Post: 15 %
FEV1-%Pred-Post: 59 %
FEV1-%Pred-Pre: 51 %
FEV1-Post: 1.9 L
FEV1-Pre: 1.65 L
FEV1FVC-%Change-Post: 8 %
FEV1FVC-%Pred-Pre: 105 %
FEV6-%Change-Post: 6 %
FEV6-%Pred-Post: 54 %
FEV6-%Pred-Pre: 51 %
FEV6-Post: 2.24 L
FEV6-Pre: 2.11 L
FEV6FVC-%Pred-Post: 105 %
FEV6FVC-%Pred-Pre: 105 %
FVC-%Change-Post: 6 %
FVC-%Pred-Post: 51 %
FVC-%Pred-Pre: 48 %
FVC-Post: 2.24 L
FVC-Pre: 2.11 L
Post FEV1/FVC ratio: 85 %
Post FEV6/FVC ratio: 100 %
Pre FEV1/FVC ratio: 78 %
Pre FEV6/FVC Ratio: 100 %

## 2019-02-08 LAB — BASIC METABOLIC PANEL
BUN: 11 mg/dL (ref 6–23)
CO2: 32 mEq/L (ref 19–32)
Calcium: 9.2 mg/dL (ref 8.4–10.5)
Chloride: 100 mEq/L (ref 96–112)
Creatinine, Ser: 1.13 mg/dL (ref 0.40–1.50)
GFR: 64.49 mL/min (ref >60.00)
Glucose, Bld: 100 mg/dL — ABNORMAL HIGH (ref 70–99)
Potassium: 3.9 mEq/L (ref 3.5–5.1)
Sodium: 140 mEq/L (ref 135–145)

## 2019-02-08 LAB — BRAIN NATRIURETIC PEPTIDE: Pro B Natriuretic peptide (BNP): 26 pg/mL (ref 0.0–100.0)

## 2019-02-08 NOTE — Progress Notes (Signed)
Sprirometry pre and post, Dlco done today.

## 2019-02-08 NOTE — Patient Instructions (Addendum)
Testing: - Pulmonary function test today shows stable lung volumes compared to 2018  - CT chest showed emphysema, mild scaring in right lung base and coronary artery atherosclerotic calcifications - Labs today   Recommendations: - Continue Breo and Incruse daily as prescribed - Encourage light cardiovascular exercise (walking on inclines, elliptical and bike) Stop if significantly short of breath. Bring rescue inhaler with you to gym  - Continue immunotherapy with Arava for rheumatoid arthritis per rheumatology  Follow-up: - Dr. Chase Caller in 3-4 months or sooner if needed  - Please follow up with cardiology regarding coronary artery atherosclerotic calcifications - Refer to Dr. Ander Slade for sleep consult re: snoring, hx osa

## 2019-02-08 NOTE — Progress Notes (Signed)
@Patient  ID: [name], male    DOB: 06-Aug-1951, 68 y.o.   MRN: [id number]  Chief Complaint  Patient presents with  . Follow-up    PFT performed today and CT performed 12/04/18.  Pt states he has been doing good since last visit except 1 week ago he began having some worsening SOB and tightness in chest. Pt also states he has an occ cough.    Referring provider: [name], MD  HPI: 68 year old male, former smoker quit in [place] (60 pack year hx). PMH significant for moderate COPD, GERD, CHF, DVT, seizure disorder, RA, prostate cancer. Patient of Dr. Chase Caller, last seen on 12/02/18. CT chest in December showed emphysema, mild bronchiectasis and peripheral subpleural reticulation within right lung base which appears slightly progressive. Ordered for PFTs with DLCO. Maintained on Breo and Incruse. Completed pulmonary rehab in early 2019. On Actemra for RA.  02/09/2019 Patient presents today for 2 month follow-up with PFTs. Some days he feels short of breath on exertion and when lying down.  No significant URI symptoms accompanying DOE. Slight cough with associated wheezing and chest tightness. Taking Breo 100 and Incruse as prescribed. Takes 80mg  lasix daily at night. Hx untreated sleep apnea. Unable to get a good seal on mask. Patient is willing to re-visit. Asking if he can exercise, joined [employer] recently with his wife. Has rescue inhaler available prn.   PFTs 02/08/2019 - FVC 2.24 (51%), FEV1 1.90 (59%), ratio 85, DLCOunc 71  PFTs 11/26/2017 - FVC 2.11 (48%), FEV1 1.77 (54%), ratio 84, DLCOcor 63    Allergies  Allergen Reactions  . Orencia [Abatacept] Anaphylaxis    Had prostate cancer  . Carbamazepine Rash    REACTION: makes drowsy BRAND NAME IS TEGRETOL  . Celecoxib Itching and Rash    BRAND NAME IS CELEBREX  . Cephalexin Rash    "Severe Rash".  BRAND NAME IS KEFLEX.   . Enbrel [Etanercept] Rash  . Humira [Adalimumab] Rash  . Levofloxacin Other (See Comments)    REACTION: GI Intolerance BRAND NAME IS LEVAQUIN  . Sulfa Antibiotics Rash  . Sulfasalazine Rash    Immunization History  Administered Date(s) Administered  . Influenza, High Dose Seasonal PF 10/05/2018  . Influenza,inj,Quad PF,6+ Mos 08/31/2015  . Influenza-Unspecified 09/30/2016, 08/22/2017  . Pneumococcal Conjugate-13 10/05/2018  . Pneumococcal-Unspecified 12/30/2012    Past Medical History:  Diagnosis Date  . Anxiety    hx of   . Arthritis   . Asthma   . Clotting disorder (HCC)    DVT both legs   . COPD (chronic obstructive pulmonary disease) ([place])   . DDD (degenerative disc disease), cervical    with UE's paresthesias  . Diverticulosis   . DVT, lower extremity ([place])    bilat  . 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 GERD (gastroesophageal reflux disease)   . Gout   . Heart murmur   . History of measles   . History of shingles   . Hypercholesterolemia   . IBS (irritable bowel syndrome)   . IBS (irritable bowel syndrome)   . Peripheral edema   . Pneumonia    hx of   . Prostate cancer ([place])   . Seizures ([place])    last seizure 20 years ago   . Shortness of breath dyspnea    exertion   . Sleep apnea    not on cpap  . Tubular  adenoma of colon 04/2008    Tobacco History: Social History   Tobacco Use  Smoking Status Former Smoker  . Packs/day: 3.00  . Years: 20.00  . Pack years: 60.00  . Types: Cigarettes  . Last attempt to quit: 12/30/1996  . Years since quitting: 22.1  Smokeless Tobacco Never Used   Counseling given: Not Answered   Outpatient Medications Prior to Visit  Medication Sig Dispense Refill  . acetaminophen (TYLENOL) 500 MG tablet Take 1,000 mg by mouth every 6 (six) hours as needed (arthritis pain).    [name] albuterol (PROVENTIL) (2.5 MG/3ML) 0.083% nebulizer solution Take 3 mLs (2.5 mg total) by nebulization every 6 (six) hours as needed for wheezing or shortness of breath. 75 mL 12    . albuterol (PROVENTIL) 2 MG tablet Take 2 mg by mouth daily.     [name] alendronate (FOSAMAX) 70 MG tablet TAKE 1 TABLET BY MOUTH ONCE A WEEK. TAKE WITH FULL GLASS OF WATER ON EMPTY STOMACH 12 tablet 0  . allopurinol (ZYLOPRIM) 100 MG tablet TAKE 1 TABLET BY MOUTH TWICE A DAY 180 tablet 0  . BREO ELLIPTA 100-25 MCG/INH AEPB INHALE 1 PUFF BY MOUTH EVERY DAY 60 each 0  . Calcium-Phosphorus-Vitamin D (CITRACAL +D3 PO) Take 2 tablets by mouth 2 (two) times daily.     . diclofenac sodium (VOLTAREN) 1 % GEL Apply 3 grams to 3 large joints up to 3 times daily 3 Tube 3  . dicyclomine (BENTYL) 10 MG capsule TAKE 1 CAPSULE BY MOUTH 4 TIMES DAILY BEFORE MEALS AND AT BEDTIME (Patient taking differently: TAKE 2 CAPSULE BY MOUTH TWO TIMES DAILY) 270 capsule 0  . folic acid (FOLVITE) 1 MG tablet TAKE 2 TABLETS BY MOUTH EVERY DAY IN THE MORNING 180 tablet 4  . furosemide (LASIX) 40 MG tablet Take 1 tablet (40 mg total) by mouth 2 (two) times daily. (Patient taking differently: Take 80 mg by mouth daily. ) 60 tablet 11  . leflunomide (ARAVA) 20 MG tablet TAKE 1 TABLET BY MOUTH EVERY DAY 90 tablet 0  . Omega-3 Fatty Acids (FISH OIL) 1200 MG CPDR Take 1,200 mg by mouth 2 (two) times daily.    [name] omeprazole (PRILOSEC) 40 MG capsule Take 40 mg by mouth 2 (two) times daily.   2  . phenytoin (DILANTIN) 100 MG ER capsule Take 100-200 mg by mouth 2 (two) times daily. Take one capsule in the morning and 2 capsules at bedtime    . Tocilizumab (ACTEMRA IV) Inject into the vein every 28 (twenty-eight) days. For Rheumatoid Arthritis    . umeclidinium bromide (INCRUSE ELLIPTA) 62.5 MCG/INH AEPB Inhale 1 puff into the lungs daily. 90 each 1  . warfarin (COUMADIN) 3 MG tablet Take 3 mg by mouth at bedtime.      No facility-administered medications prior to visit.     Review of Systems  Review of Systems  Constitutional: Negative.   HENT: Negative.   Respiratory: Positive for chest tightness, shortness of breath and wheezing.    Cardiovascular: Positive for leg swelling.    Physical Exam  BP 132/70 (BP Location: Left Arm, Cuff Size: Normal)   Pulse 76   Ht 5\' 9"  (1.753 m)   Wt 226 lb (102.5 kg)   SpO2 95%   BMI 33.37 kg/m  Physical Exam Constitutional:      Appearance: Normal appearance. He is obese. He is not ill-appearing.  HENT:     Right Ear: Tympanic membrane normal.     Left  Ear: Tympanic membrane normal.     Nose: Nose normal.     Mouth/Throat:     Mouth: Mucous membranes are moist.     Pharynx: Oropharynx is clear.  Neck:     Musculoskeletal: Normal range of motion and neck supple.  Cardiovascular:     Rate and Rhythm: Normal rate and regular rhythm.     Heart sounds: Normal heart sounds.     Comments: RRR, +2 BLE R>L Pulmonary:     Effort: Pulmonary effort is normal.     Breath sounds: No wheezing or rhonchi.     Comments: CTA t/o Musculoskeletal: Normal range of motion.  Skin:    General: Skin is warm and dry.  Neurological:     General: No focal deficit present.     Mental Status: He is alert and oriented to person, place, and time. Mental status is at baseline.  Psychiatric:        Mood and Affect: Mood normal.        Behavior: Behavior normal.        Thought Content: Thought content normal.        Judgment: Judgment normal.      Lab Results:  CBC    Component Value Date/Time   WBC 5.7 11/29/2018 1102   RBC 4.61 11/29/2018 1102   HGB 13.8 11/29/2018 1102   HGB 14.7 11/25/2016 0824   HCT 42.9 11/29/2018 1102   HCT 43.4 11/25/2016 0824   PLT 174 11/29/2018 1102   PLT 213 11/25/2016 0824   MCV 93.1 11/29/2018 1102   MCV 87 11/25/2016 0824   MCH 29.9 11/29/2018 1102   MCHC 32.2 11/29/2018 1102   RDW 13.8 11/29/2018 1102   RDW 16.0 (H) 11/25/2016 0824   LYMPHSABS 0.8 11/29/2018 1102   LYMPHSABS 0.9 11/25/2016 0824   MONOABS 0.8 11/29/2018 1102   EOSABS 0.4 11/29/2018 1102   EOSABS 0.4 11/25/2016 0824   BASOSABS 0.1 11/29/2018 1102   BASOSABS 0.1 11/25/2016 0824     BMET    Component Value Date/Time   NA 140 02/08/2019 1437   NA 144 11/25/2016 0824   K 3.9 02/08/2019 1437   CL 100 02/08/2019 1437   CO2 32 02/08/2019 1437   GLUCOSE 100 (H) 02/08/2019 1437   BUN 11 02/08/2019 1437   BUN 13 11/25/2016 0824   CREATININE 1.13 02/08/2019 1437   CREATININE 1.24 03/18/2018 1107   CALCIUM 9.2 02/08/2019 1437   GFRNONAA 59 (L) 11/30/2018 1108   GFRNONAA 60 03/18/2018 1107   GFRAA >60 11/30/2018 1108   GFRAA 69 03/18/2018 1107    BNP    Component Value Date/Time   BNP 32.0 07/05/2017 1711    ProBNP    Component Value Date/Time   PROBNP 26.0 02/08/2019 1437    Imaging: Ct Abdomen Pelvis W Wo Contrast  Result Date: 01/13/2019 CLINICAL DATA:  Gross hematuria. History of colon cancer, prostate cancer status post surgery and radiation therapy EXAM: CT ABDOMEN AND PELVIS WITHOUT AND WITH CONTRAST TECHNIQUE: Multidetector CT imaging of the abdomen and pelvis was performed following the standard protocol before and following the bolus administration of intravenous contrast. CONTRAST:  119mL ISOVUE-300 IOPAMIDOL (ISOVUE-300) INJECTION 61% COMPARISON:  PET-CT dated 11/28/2008 FINDINGS: Lower chest: Eventration of the left hemidiaphragm with associated left basilar scarring/atelectasis. Mild subpleural scarring/atelectasis in the right lower lobe. Hepatobiliary: Liver is within normal limits. Status post cholecystectomy. No intrahepatic or extrahepatic ductal dilatation. Pancreas: Within normal limits. Spleen: Within normal limits. Adrenals/Urinary Tract:  Adrenal glands are within normal limits. Kidneys are within normal limits. No renal, ureteral, or bladder calculi. No hydronephrosis. No enhancing renal lesions. Thick-walled bladder, although underdistended. Stomach/Bowel: Stomach is within normal limits. No evidence of bowel obstruction. Normal appendix (series 3/image 63). Left colonic diverticulosis, without evidence of diverticulitis.  Vascular/Lymphatic: No evidence of abdominal aortic aneurysm. Atherosclerotic calcifications of the abdominal aorta and branch vessels. No suspicious abdominopelvic lymphadenopathy. Reproductive: Status post prostatectomy. Other: No abdominopelvic ascites. Musculoskeletal: Visualized osseous structures are within normal limits. No focal osseous lesions. IMPRESSION: No renal, ureteral, or bladder calculi.  No hydronephrosis. No enhancing renal lesions. Thick-walled bladder, possibly reflecting radiation changes. Status post prostatectomy. No evidence of recurrent or metastatic disease. Electronically Signed   By: Julian Hy M.D.   On: 01/13/2019 19:48   Xr Foot 2 Views Left  Result Date: 02/03/2019 First MTP, all PIP and DIP narrowing was noted.  No intertarsal, tibiotalar subtalar joint space narrowing was noted.  Small calcaneal spur was noted.  Juxta-articular osteopenia was noted.  No erosive changes were noted. Impression: These findings are consistent with rheumatoid arthritis and osteoarthritis overlap.  Xr Foot 2 Views Right  Result Date: 02/03/2019 First MTP, all PIP and DIP narrowing was noted.  No intertarsal, tibiotalar subtalar joint space narrowing was noted.  Small calcaneal spur was noted.  Juxta-articular osteopenia was noted.  No erosive changes were noted. Impression: These findings are consistent with rheumatoid arthritis and osteoarthritis overlap.    Assessment & Plan:   Chronic obstructive pulmonary disease ([place]) - PFTs stable compared to 2018 - Continue Breo and Incruse. PRN albuterol - Encourage exercise and weight loss   Abnormal CT of the chest - CT chest 11/2018- mild bronchiectasis and peripheral subpleural reticulation within right lung base which appears slightly progressive - DLCOunc 18.10 (71%), DL/VA 115% (stable/improved compared to 2018) - Ambulatory O2 saturation normal   Rheumatoid arthritis (HCC) - On Actemra for RA    CHF (congestive heart  failure) ([place]) - Stable; Continues Lasix 40mg  BID - BNP 26   Martyn Ehrich, NP 02/09/2019

## 2019-02-09 ENCOUNTER — Encounter: Payer: Self-pay | Admitting: Primary Care

## 2019-02-09 DIAGNOSIS — R9389 Abnormal findings on diagnostic imaging of other specified body structures: Secondary | ICD-10-CM | POA: Insufficient documentation

## 2019-02-09 NOTE — Assessment & Plan Note (Addendum)
-   Stable; Continues Lasix 40mg  BID - BNP 26

## 2019-02-09 NOTE — Assessment & Plan Note (Addendum)
-   PFTs stable compared to 2018 - Continue Breo and Incruse. PRN albuterol - Encourage exercise and weight loss

## 2019-02-09 NOTE — Assessment & Plan Note (Signed)
-   On Actemra for RA

## 2019-02-09 NOTE — Assessment & Plan Note (Addendum)
-   CT chest 11/2018- mild bronchiectasis and peripheral subpleural reticulation within right lung base which appears slightly progressive - DLCOunc 18.10 (71%), DL/VA 115% (stable/improved compared to 2018) - Ambulatory O2 saturation normal

## 2019-02-10 ENCOUNTER — Ambulatory Visit (INDEPENDENT_AMBULATORY_CARE_PROVIDER_SITE_OTHER): Payer: Medicare Other | Admitting: Urology

## 2019-02-10 DIAGNOSIS — R310 Gross hematuria: Secondary | ICD-10-CM

## 2019-02-16 ENCOUNTER — Other Ambulatory Visit: Payer: Self-pay | Admitting: Internal Medicine

## 2019-02-16 ENCOUNTER — Other Ambulatory Visit: Payer: Self-pay | Admitting: Urology

## 2019-02-19 ENCOUNTER — Other Ambulatory Visit: Payer: Self-pay | Admitting: *Deleted

## 2019-02-19 NOTE — Progress Notes (Signed)
Infusion orders are current for patient CBC CMP Tylenol Benadryl appointments are up to date and follow up appointment  is scheduled TB gold due and order placed.  

## 2019-02-22 ENCOUNTER — Institutional Professional Consult (permissible substitution): Payer: Medicare Other | Admitting: Pulmonary Disease

## 2019-02-22 ENCOUNTER — Ambulatory Visit (HOSPITAL_COMMUNITY)
Admission: RE | Admit: 2019-02-22 | Discharge: 2019-02-22 | Disposition: A | Discharge status: Home / Self Care | Payer: Medicare Other | Source: Ambulatory Visit | Attending: Rheumatology | Admitting: Rheumatology

## 2019-02-22 DIAGNOSIS — M0579 Rheumatoid arthritis with rheumatoid factor of multiple sites without organ or systems involvement: Secondary | ICD-10-CM | POA: Insufficient documentation

## 2019-02-22 LAB — COMPREHENSIVE METABOLIC PANEL
ALT: 29 U/L (ref 0–44)
AST: 34 U/L (ref 15–41)
Albumin: 3.9 g/dL (ref 3.5–5.0)
Alkaline Phosphatase: 48 U/L (ref 38–126)
Anion gap: 9 (ref 5–15)
BUN: 10 mg/dL (ref 8–23)
CO2: 30 mmol/L (ref 22–32)
Calcium: 9 mg/dL (ref 8.9–10.3)
Chloride: 101 mmol/L (ref 98–111)
Creatinine, Ser: 1.16 mg/dL (ref 0.61–1.24)
GFR calc Af Amer: >60 mL/min (ref >60)
GFR calc non Af Amer: >60 mL/min (ref >60)
Glucose, Bld: 112 mg/dL — ABNORMAL HIGH (ref 70–99)
Potassium: 3.8 mmol/L (ref 3.5–5.1)
Sodium: 140 mmol/L (ref 135–145)
Total Bilirubin: 1.1 mg/dL (ref 0.3–1.2)
Total Protein: 6.6 g/dL (ref 6.5–8.1)

## 2019-02-22 LAB — CBC
HCT: 46.8 % (ref 39.0–52.0)
Hemoglobin: 14.8 g/dL (ref 13.0–17.0)
MCH: 29.2 pg (ref 26.0–34.0)
MCHC: 31.6 g/dL (ref 30.0–36.0)
MCV: 92.5 fL (ref 80.0–100.0)
Platelets: 156 10*3/uL (ref 150–400)
RBC: 5.06 MIL/uL (ref 4.22–5.81)
RDW: 13.5 % (ref 11.5–15.5)
WBC: 6 10*3/uL (ref 4.0–10.5)
nRBC: 0 % (ref 0.0–0.2)

## 2019-02-22 MED ORDER — ACETAMINOPHEN 325 MG PO TABS: 650.0000 mg | ORAL_TABLET | ORAL | Status: DC

## 2019-02-22 MED ORDER — TOCILIZUMAB 400 MG/20ML IV SOLN
400.0000 mg | INTRAVENOUS | Status: AC
  Administered 2019-02-22: 400 mg via INTRAVENOUS
  Filled 2019-02-22: qty 20

## 2019-02-22 MED ORDER — DIPHENHYDRAMINE HCL 25 MG PO CAPS: 25.0000 mg | ORAL_CAPSULE | ORAL | Status: DC

## 2019-02-24 LAB — QUANTIFERON-TB GOLD PLUS (RQFGPL)
QuantiFERON Mitogen Value: 5.05 IU/mL
QuantiFERON Nil Value: 0.24 IU/mL
QuantiFERON TB1 Ag Value: 0.21 IU/mL
QuantiFERON TB2 Ag Value: 0.18 IU/mL

## 2019-02-24 LAB — QUANTIFERON-TB GOLD PLUS: QuantiFERON-TB Gold Plus: NEGATIVE

## 2019-03-01 ENCOUNTER — Telehealth: Payer: Self-pay

## 2019-03-01 ENCOUNTER — Ambulatory Visit (INDEPENDENT_AMBULATORY_CARE_PROVIDER_SITE_OTHER): Payer: Medicare Other | Admitting: Physician Assistant

## 2019-03-01 ENCOUNTER — Encounter: Payer: Self-pay | Admitting: Physician Assistant

## 2019-03-01 VITALS — BP 152/98 | HR 65 | Ht 68.0 in | Wt 227.2 lb

## 2019-03-01 DIAGNOSIS — R600 Localized edema: Secondary | ICD-10-CM | POA: Diagnosis not present

## 2019-03-01 DIAGNOSIS — R002 Palpitations: Secondary | ICD-10-CM | POA: Diagnosis not present

## 2019-03-01 DIAGNOSIS — R079 Chest pain, unspecified: Secondary | ICD-10-CM | POA: Diagnosis not present

## 2019-03-01 DIAGNOSIS — I824Y9 Acute embolism and thrombosis of unspecified deep veins of unspecified proximal lower extremity: Secondary | ICD-10-CM | POA: Diagnosis not present

## 2019-03-01 DIAGNOSIS — J449 Chronic obstructive pulmonary disease, unspecified: Secondary | ICD-10-CM | POA: Diagnosis not present

## 2019-03-01 MED ORDER — FUROSEMIDE 40 MG PO TABS: 40.0000 mg | ORAL_TABLET | Freq: Two times a day (BID) | ORAL | 11 refills | Status: DC

## 2019-03-01 NOTE — Progress Notes (Signed)
Agree. Was that 152/98 BP accurate? Does he check at home? MCr

## 2019-03-01 NOTE — Telephone Encounter (Signed)
[id number] #[id number] 

## 2019-03-01 NOTE — Progress Notes (Signed)
Cardiology Office Note    Date:  03/01/2019   ID:  [name], DOB March 17, 1951, MRN [id number]  PCP:  [name], MD  Cardiologist:  Dr. Sallyanne Kuster   Chief Complaint  Patient presents with  . Follow-up    History of Present Illness:  [name] is a 68 y.o. male with PMH of recurrent lower extremity DVT on lifelong Coumadin, postphlebitic syndrome, chronic right-sided heart failure, former tobacco abuse, COPD, hyperlipidemia, and a remote history of seizure.  Last echocardiogram obtained on 01/21/2012 showed EF greater than 55%, mild LVH, impaired LV relaxation, RVSP elevated at 30 to 40 mmHg.  Myoview obtained on 07/20/2014 showed EF 58%, no ischemia.  Recent CT of the chest in December 2019 showed emphysema and mild bronchiectasis.  It does reveal multivessel coronary atherosclerotic calcifications.  PFT repeated on 02/08/2019 continue to show severe obstructive airway disease.  Talking with the patient today, he does complain of worsening dyspnea with exertion.  He also complains of occasional sharp stabbing pain in his chest.  This only lasted a few seconds at a time before resolving and it does not have clear correlation with exertion.  Symptom is fairly atypical for angina.  We discussed possibility of a plain old treadmill test, nuclear stress test versus coronary CT.  I do not think he can walk on the treadmill.  I recommend a Lexiscan Myoview.  He also complained of occasional fluttering sensation in the chest.  He had 2 episodes of fluttering sensation in the past month.  I will order a 14-day Zio Patch.  Otherwise, he appears to be euvolemic on physical exam.  Although his blood pressure is borderline high today, however his blood pressure is usually very well controlled.  On physical exam, he has at least 1-2+ pitting edema, he says the current degree of edema is slightly more than usual.  I asked him to take higher dose of Lasix 80 mg twice daily for 2 days before going back  to the previous dose.   Past Medical History:  Diagnosis Date  . Anxiety    hx of   . Arthritis   . Asthma   . Clotting disorder (HCC)    DVT both legs   . COPD (chronic obstructive pulmonary disease) ([place])   . DDD (degenerative disc disease), cervical    with UE's paresthesias  . Diverticulosis   . DVT, lower extremity ([place])    bilat  . 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 GERD (gastroesophageal reflux disease)   . Gout   . Heart murmur   . History of measles   . History of shingles   . Hypercholesterolemia   . IBS (irritable bowel syndrome)   . IBS (irritable bowel syndrome)   . Peripheral edema   . Pneumonia    hx of   . Prostate cancer ([place])   . Seizures ([place])    last seizure 20 years ago   . Shortness of breath dyspnea    exertion   . Sleep apnea    not on cpap  . Tubular adenoma of colon 04/2008    Past Surgical History:  Procedure Laterality Date  . CHOLECYSTECTOMY    . COLONOSCOPY    . DOPPLER ECHOCARDIOGRAPHY N/A 01-21-2012   TECHNICALLY DIFFICULT. MILD CONCENTRIC LV HYPERTROPHY. LV CAVITY IS SMALL.. EF=> 55%. TRANSMITRAL SPECTRAL FLOW PATTREN IS SUGGESTIVE OF IMPAIRED LV RELAXATION. RV SYSTOLIC  PRESSURE IS 31MMHG. LEFT ATRIAL SIZE IS NORMAL. AV APPEARS MILDLY SCLEROTIC. NO SIGN VALVE DISEASE NOTED.  [name] LYMPHADENECTOMY Bilateral 08/30/2015   Procedure: PELVIC LYMPHADENECTOMY;  Surgeon: [name], MD;  Location: [facility];  Service: Urology;  Laterality: Bilateral;  . NUCLEAR STRESS TEST N/A 01-21-2012   NORMAL PATTERN OF PERFUSION IN ALL REGIONS. POST STRESS LV SIZE IS NORMAL. NO EVIDENCE OF INDUCIBLE ISCHEMIA. EF 54%.  [name] POLYPECTOMY    . PROSTATE BIOPSY    . ROBOT ASSISTED LAPAROSCOPIC RADICAL PROSTATECTOMY N/A 08/30/2015   Procedure: ROBOTIC ASSISTED LAPAROSCOPIC RADICAL PROSTATECTOMY;  Surgeon: [name], MD;  Location: [facility];  Service: Urology;  Laterality: N/A;  . US VENOUS  LOWER EXT Right 03/07/11   PERSISTENT DVT IN RIGHT LOWER EXT.WITH PERSISTANT VISUALIZATION OF HYPOECHOIC THROMBUS WITHIN THE FEMORAL, PROFUNDA FEMORAL AND POPLITEAL VEINS. WHEN COMPARED TO PREVIOUS, CLOT IS NO LONGER IDENTIFIED WITH IN THE RIGHT CFV.    Current Medications: Outpatient Medications Prior to Visit  Medication Sig Dispense Refill  . acetaminophen (TYLENOL) 500 MG tablet Take 1,000 mg by mouth every 6 (six) hours as needed (arthritis pain).    [name] albuterol (PROVENTIL) (2.5 MG/3ML) 0.083% nebulizer solution Take 3 mLs (2.5 mg total) by nebulization every 6 (six) hours as needed for wheezing or shortness of breath. 75 mL 12  . albuterol (PROVENTIL) 2 MG tablet Take 2 mg by mouth daily.     [name] alendronate (FOSAMAX) 70 MG tablet TAKE 1 TABLET BY MOUTH ONCE A WEEK. TAKE WITH FULL GLASS OF WATER ON EMPTY STOMACH 12 tablet 0  . allopurinol (ZYLOPRIM) 100 MG tablet TAKE 1 TABLET BY MOUTH TWICE A DAY 180 tablet 0  . BREO ELLIPTA 100-25 MCG/INH AEPB INHALE 1 PUFF BY MOUTH EVERY DAY 60 each 0  . Calcium-Phosphorus-Vitamin D (CITRACAL +D3 PO) Take 2 tablets by mouth 2 (two) times daily.     . diclofenac sodium (VOLTAREN) 1 % GEL Apply 3 grams to 3 large joints up to 3 times daily 3 Tube 3  . dicyclomine (BENTYL) 10 MG capsule TAKE 1 CAPSULE BY MOUTH 4 TIMES DAILY BEFORE MEALS AND AT BEDTIME (Patient taking differently: TAKE 2 CAPSULE BY MOUTH TWO TIMES DAILY) 102 capsule 0  . folic acid (FOLVITE) 1 MG tablet TAKE 2 TABLETS BY MOUTH EVERY DAY IN THE MORNING 180 tablet 4  . INCRUSE ELLIPTA 62.5 MCG/INH AEPB TAKE 1 PUFF BY MOUTH EVERY DAY 30 each 2  . leflunomide (ARAVA) 20 MG tablet TAKE 1 TABLET BY MOUTH EVERY DAY 90 tablet 0  . Omega-3 Fatty Acids (FISH OIL) 1200 MG CPDR Take 1,200 mg by mouth 2 (two) times daily.    [name] omeprazole (PRILOSEC) 40 MG capsule Take 40 mg by mouth 2 (two) times daily.   2  . phenytoin (DILANTIN) 100 MG ER capsule Take 100-200 mg by mouth 2 (two) times daily. Take one  capsule in the morning and 2 capsules at bedtime    . Potassium Chloride ER 20 MEQ TBCR Take 1 tablet by mouth daily.    . Tocilizumab (ACTEMRA IV) Inject into the vein every 28 (twenty-eight) days. For Rheumatoid Arthritis    . warfarin (COUMADIN) 3 MG tablet Take 3 mg by mouth at bedtime.     . furosemide (LASIX) 40 MG tablet Take 1 tablet (40 mg total) by mouth 2 (two) times daily. (Patient taking differently: Take 80 mg by mouth daily. ) 60 tablet 11   No facility-administered medications prior to visit.  Allergies:   Orencia [abatacept]; Carbamazepine; Celecoxib; Cephalexin; Enbrel [etanercept]; Humira [adalimumab]; Levofloxacin; Sulfa antibiotics; and Sulfasalazine   Social History   Socioeconomic History  . Marital status: Married    Spouse name: Not on file  . Number of children: 3  . Years of education: Not on file  . Highest education level: Not on file  Occupational History  . Occupation: retired    Fish farm manager: [employer]  Social Needs  . Financial resource strain: Not on file  . Food insecurity:    Worry: Not on file    Inability: Not on file  . Transportation needs:    Medical: Not on file    Non-medical: Not on file  Tobacco Use  . Smoking status: Former Smoker    Packs/day: 3.00    Years: 20.00    Pack years: 60.00    Types: Cigarettes    Last attempt to quit: 12/30/1996    Years since quitting: 22.1  . Smokeless tobacco: Never Used  Substance and Sexual Activity  . Alcohol use: No    Alcohol/week: 0.0 standard drinks  . Drug use: No  . Sexual activity: Yes  Lifestyle  . Physical activity:    Days per week: Not on file    Minutes per session: Not on file  . Stress: Not on file  Relationships  . Social connections:    Talks on phone: Not on file    Gets together: Not on file    Attends religious service: Not on file    Active member of club or organization: Not on file    Attends meetings of clubs or organizations: Not on file    Relationship  status: Not on file  Other Topics Concern  . Not on file  Social History Narrative  . Not on file     Family History:  The patient's family history includes Alzheimer's disease in his mother; Bladder Cancer in his sister; Breast cancer in his sister; Colon cancer in his brother; Heart attack in his brother, brother, father, and sister; Heart murmur in his sister; Lung cancer in his brother; Skin cancer in his father; Stomach cancer in his brother and father; Stroke in his sister; Throat cancer in his brother.   ROS:   Please see the history of present illness.    ROS All other systems reviewed and are negative.   PHYSICAL EXAM:   VS:  BP (!) 152/98   Pulse 65   Ht 5\' 8"  (1.727 m)   Wt 227 lb 3.2 oz (103.1 kg)   BMI 34.55 kg/m    GEN: Well nourished, well developed, in no acute distress  HEENT: normal  Neck: no JVD, carotid bruits, or masses Cardiac: RRR; no murmurs, rubs, or gallops. 1-2+ edema  Respiratory:  clear to auscultation bilaterally, normal work of breathing GI: soft, nontender, nondistended, + BS MS: no deformity or atrophy  Skin: warm and dry, no rash Neuro:  Alert and Oriented x 3, Strength and sensation are intact Psych: euthymic mood, full affect  Wt Readings from Last 3 Encounters:  03/01/19 227 lb 3.2 oz (103.1 kg)  02/22/19 222 lb (100.7 kg)  02/08/19 226 lb (102.5 kg)      Studies/Labs Reviewed:   EKG:  EKG is ordered today.  The ekg ordered today demonstrates normal sinus rhythm without significant ST-T wave changes  Recent Labs: 02/08/2019: Pro B Natriuretic peptide (BNP) 26.0 02/22/2019: ALT 29; BUN 10; Creatinine, Ser 1.16; Hemoglobin 14.8; Platelets 156; Potassium 3.8; Sodium  140   Lipid Panel No results found for: CHOL, TRIG, HDL, CHOLHDL, VLDL, LDLCALC, LDLDIRECT  Additional studies/ records that were reviewed today include:   Myoview 07/20/2014 Overall Impression:  Low risk stress nuclear study with bowel attenuation artifact. No reversible  ischemia. Extracardiac tracer uptake noted in the left chest - patient noted to have elevated L hemidiaphragm. CXR in 05/2014 does not show mass in this area. Clinical correlation advised.  LV Wall Motion:  NL LV Function; NL Wall Motion; EF 58%.    ASSESSMENT:    1. Chest pain, unspecified type   2. Palpitations   3. Leg edema   4. Deep vein thrombosis (DVT) of proximal lower extremity, unspecified chronicity, unspecified laterality ([place])   5. Chronic obstructive pulmonary disease, unspecified COPD type ([place])      PLAN:  In order of problems listed above:  1. Chest pain: His chest pain is fairly atypical and only lasted seconds at this time.  He this has been going on for the past month and it does not correlate with exertion.  We discussed various options, we also discussed the benefit and risk of the stress test, I recommend a Lexiscan Myoview in this case.  2. Palpitation: He had a 2 episode of tachycardia palpitation in the past month, this does not appears to correlate with the recent chest discomfort.  I recommended a Zio patch monitor  3. Leg edema: He has history of chronic right heart failure.  He is on 40 mg twice daily of Lasix.  On physical exam, he has 1-2+ pitting edema, he says this is slightly more swelling than usual.  Compared to the previous weight, his weight today is 6 pounds higher.  I recommended increase Lasix to 80 mg twice daily for 2 days before going back to 40 mg twice daily thereafter.  4. History of DVT: On lifelong anticoagulation due to recurrence.  Continue Coumadin  5. COPD: Followed by pulmonology service.    Medication Adjustments/Labs and Tests Ordered: Current medicines are reviewed at length with the patient today.  Concerns regarding medicines are outlined above.  Medication changes, Labs and Tests ordered today are listed in the Patient Instructions below. Patient Instructions  Medication Instructions:  NO CHANGES- Your physician  recommends that you continue on your current medications as directed. Please refer to the Current Medication list given to you today.  If you need a refill on your cardiac medications before your next appointment, please call your pharmacy.  Testing/Procedures: Your physician has requested that you have a lexiscan myoview. A cardiac stress test is a cardiological test that measures the heart's ability to respond to external stress in a controlled clinical environment. The stress response is induced by intravenous pharmacological stimulation.  NO CAFFEINE 12 HOURS PRIOR TO TESTING  Your physician has recommended that you wear a 14 DAY ZIO-PATCH monitor. The Zio patch cardiac monitor continuously records heart rhythm data for up to 14 days, this is for patients being evaluated for multiple types heart rhythms. For the first 24 hours post application, please avoid getting the Zio monitor wet in the shower or by excessive sweating during exercise. After that, feel free to carry on with regular activities. Keep soaps and lotions away from the ZIO XT Patch.  This will be placed at our [facility] location - [address].      Follow-Up: You will need a follow up appointment in 2-3 months.  Please call our office 2 months  in advance to schedule this appointment.  You may see [name], MD or one of the following Advanced Practice Providers on your designated Care Team: Almyra Deforest, [place] Fabian Sharp, PA-C   At [facility], you and your health needs are our priority.  As part of our continuing mission to provide you with exceptional heart care, we have created designated Provider Care Teams.  These Care Teams include your primary Cardiologist (physician) and Advanced Practice Providers (APPs -  Physician Assistants and Nurse Practitioners) who all work together to provide you with the care you need, when you need it.  Thank you for choosing [facility] at [facility]!!    ---> AFTER APPT  INCREASE LASIX 80MG  DAILY X2DAYS THEN BACK TO     Hilbert Corrigan, [place]  03/01/2019 11:31 AM    [facility] [place], [place], [place]  [address] Phone: [phone]; Fax: [phone]

## 2019-03-01 NOTE — Patient Instructions (Addendum)
Medication Instructions:  NO CHANGES- Your physician recommends that you continue on your current medications as directed. Please refer to the Current Medication list given to you today.  If you need a refill on your cardiac medications before your next appointment, please call your pharmacy.  Testing/Procedures: Your physician has requested that you have a lexiscan myoview. A cardiac stress test is a cardiological test that measures the heart's ability to respond to external stress in a controlled clinical environment. The stress response is induced by intravenous pharmacological stimulation.  NO CAFFEINE 12 HOURS PRIOR TO TESTING  Your physician has recommended that you wear a 14 DAY ZIO-PATCH monitor. The Zio patch cardiac monitor continuously records heart rhythm data for up to 14 days, this is for patients being evaluated for multiple types heart rhythms. For the first 24 hours post application, please avoid getting the Zio monitor wet in the shower or by excessive sweating during exercise. After that, feel free to carry on with regular activities. Keep soaps and lotions away from the ZIO XT Patch.  This will be placed at our [facility] location - [address].      Follow-Up: You will need a follow up appointment in 2-3 months.  Please call our office 2 months in advance to schedule this appointment.  You may see [name], MD or one of the following Advanced Practice Providers on your designated Care Team: Almyra Deforest, [place] Fabian Sharp, PA-C   At [facility], you and your health needs are our priority.  As part of our continuing mission to provide you with exceptional heart care, we have created designated Provider Care Teams.  These Care Teams include your primary Cardiologist (physician) and Advanced Practice Providers (APPs -  Physician Assistants and Nurse Practitioners) who all work together to provide you with the care you need, when you need it.  Thank you for  choosing [facility] at [facility]!!    ---> AFTER APPT INCREASE LASIX 80MG  DAILY X2DAYS THEN BACK TO

## 2019-03-01 NOTE — Telephone Encounter (Signed)
MAKE SURE TO TAKE LASIX 80MG  BID X2 DAYS THEN BACK TO 40MG  BID-THURSDAY

## 2019-03-01 NOTE — Telephone Encounter (Signed)
D/W PT WIFE (DPR) VERBALIZES UNDERSTANDING SHE STATES THAT WILL MAKE SURE THE HE INCREASE LAS X2 DAYS ONLY, THEN BACK TO 40MG  BID REGULAR DOSING.

## 2019-03-02 ENCOUNTER — Telehealth: Payer: Self-pay | Admitting: Internal Medicine

## 2019-03-02 NOTE — Telephone Encounter (Signed)
Spoke with pt, advised him that I had no documentation about a call for him. I did remind him that he had an appt on 03/08/2019 with RA. Pt understood and nothing further is needed.

## 2019-03-03 ENCOUNTER — Telehealth (HOSPITAL_COMMUNITY): Payer: Self-pay

## 2019-03-03 DIAGNOSIS — I82409 Acute embolism and thrombosis of unspecified deep veins of unspecified lower extremity: Secondary | ICD-10-CM | POA: Diagnosis not present

## 2019-03-03 DIAGNOSIS — Z79899 Other long term (current) drug therapy: Secondary | ICD-10-CM | POA: Diagnosis not present

## 2019-03-03 NOTE — Telephone Encounter (Signed)
Encounter complete. 

## 2019-03-05 ENCOUNTER — Other Ambulatory Visit: Payer: Self-pay | Admitting: *Deleted

## 2019-03-05 ENCOUNTER — Ambulatory Visit (HOSPITAL_COMMUNITY)
Admission: RE | Admit: 2019-03-05 | Discharge: 2019-03-05 | Disposition: A | Discharge status: Home / Self Care | Payer: Medicare Other | Source: Ambulatory Visit | Attending: Cardiovascular Disease | Admitting: Cardiovascular Disease

## 2019-03-05 DIAGNOSIS — M0579 Rheumatoid arthritis with rheumatoid factor of multiple sites without organ or systems involvement: Secondary | ICD-10-CM

## 2019-03-05 DIAGNOSIS — R079 Chest pain, unspecified: Secondary | ICD-10-CM | POA: Diagnosis not present

## 2019-03-05 LAB — MYOCARDIAL PERFUSION IMAGING
LV dias vol: 101 mL (ref 62–150)
LV sys vol: 52 mL
Peak HR: 94 {beats}/min
Rest HR: 74 {beats}/min
SDS: 3
SRS: 1
SSS: 4
TID: 1.1

## 2019-03-05 MED ORDER — TECHNETIUM TC 99M TETROFOSMIN IV KIT
30.5000 | PACK | Freq: Once | INTRAVENOUS | Status: AC | PRN
  Administered 2019-03-05: 30.5 via INTRAVENOUS
  Filled 2019-03-05: qty 31

## 2019-03-05 MED ORDER — TECHNETIUM TC 99M TETROFOSMIN IV KIT
10.3000 | PACK | Freq: Once | INTRAVENOUS | Status: AC | PRN
  Administered 2019-03-05: 10.3 via INTRAVENOUS
  Filled 2019-03-05: qty 11

## 2019-03-05 MED ORDER — ADENOSINE (DIAGNOSTIC) 3 MG/ML IV SOLN
0.5600 mg/kg | Freq: Once | INTRAVENOUS | Status: AC
  Administered 2019-03-05: 57.6 mg via INTRAVENOUS

## 2019-03-08 ENCOUNTER — Institutional Professional Consult (permissible substitution): Payer: Medicare Other | Admitting: Pulmonary Disease

## 2019-03-09 ENCOUNTER — Telehealth: Payer: Self-pay | Admitting: Physician Assistant

## 2019-03-09 NOTE — Telephone Encounter (Signed)
Pre-cert Verification for the following procedure    14 day long term monitor scheduled for 03-10-2019 at [facility]

## 2019-03-10 ENCOUNTER — Other Ambulatory Visit: Payer: Self-pay

## 2019-03-10 ENCOUNTER — Ambulatory Visit (INDEPENDENT_AMBULATORY_CARE_PROVIDER_SITE_OTHER): Payer: Medicare Other | Admitting: Physician Assistant

## 2019-03-10 ENCOUNTER — Ambulatory Visit (INDEPENDENT_AMBULATORY_CARE_PROVIDER_SITE_OTHER): Payer: Medicare Other | Admitting: Pulmonary Disease

## 2019-03-10 ENCOUNTER — Encounter: Payer: Self-pay | Admitting: Pulmonary Disease

## 2019-03-10 ENCOUNTER — Ambulatory Visit: Payer: Medicare Other

## 2019-03-10 VITALS — BP 136/80 | HR 71 | Ht 68.0 in | Wt 225.0 lb

## 2019-03-10 DIAGNOSIS — G4733 Obstructive sleep apnea (adult) (pediatric): Secondary | ICD-10-CM | POA: Diagnosis not present

## 2019-03-10 DIAGNOSIS — R002 Palpitations: Secondary | ICD-10-CM

## 2019-03-10 NOTE — Patient Instructions (Signed)
Home sleep test 

## 2019-03-10 NOTE — Progress Notes (Signed)
Patient in office for Zio monitor placement - 14 day.

## 2019-03-10 NOTE — Patient Instructions (Signed)
Your procedure is scheduled on: 03/17/2019  Report to  [facility] at  10:30   AM.  Call this number if you have problems the morning of surgery: [phone]   Remember:   Do not Eat or Drink after midnight   :  Take these medicines the morning of surgery with A SIP OF WATER: Dilantin and Albuterol Neb if needed   Do not wear jewelry, make-up or nail polish.  Do not wear lotions, powders, or perfumes. You may wear deodorant.  Do not shave 48 hours prior to surgery. Men may shave face and neck.  Do not bring valuables to the hospital.  Contacts, dentures or bridgework may not be worn into surgery.  Leave suitcase in the car. After surgery it may be brought to your room.  For patients admitted to the hospital, checkout time is 11:00 AM the day of discharge.   Patients discharged the day of surgery will not be allowed to drive home.     Cystoscopy  Cystoscopy is a procedure that is used to help diagnose and sometimes treat conditions that affect that lower urinary tract. The lower urinary tract includes the bladder and the tube that drains urine from the bladder out of the body (urethra). Cystoscopy is performed with a thin, tube-shaped instrument with a light and camera at the end (cystoscope). The cystoscope may be hard (rigid) or flexible, depending on the goal of the procedure.The cystoscope is inserted through the urethra, into the bladder. Cystoscopy may be recommended if you have:  Urinary tractinfections that keep coming back (recurring).  Blood in the urine (hematuria).  Loss of bladder control (urinary incontinence) or an overactive bladder.  Unusual cells found in a urine sample.  A blockage in the urethra.  Painful urination.  An abnormality in the bladder found during an intravenous pyelogram (IVP) or CT scan. Cystoscopy may also be done to remove a sample of tissue to be examined under a microscope (biopsy). Tell a health care provider about:  Any allergies you  have.  All medicines you are taking, including vitamins, herbs, eye drops, creams, and over-the-counter medicines.  Any problems you or family members have had with anesthetic medicines.  Any blood disorders you have.  Any surgeries you have had.  Any medical conditions you have.  Whether you are pregnant or may be pregnant. What are the risks? Generally, this is a safe procedure. However, problems may occur, including:  Infection.  Bleeding.  Allergic reactions to medicines.  Damage to other structures or organs. What happens before the procedure?  Ask your health care provider about: ? Changing or stopping your regular medicines. This is especially important if you are taking diabetes medicines or blood thinners. ? Taking medicines such as aspirin and ibuprofen. These medicines can thin your blood. Do not take these medicines before your procedure if your health care provider instructs you not to.  Follow instructions from your health care provider about eating or drinking restrictions.  You may be given antibiotic medicine to help prevent infection.  You may have an exam or testing, such as X-rays of the bladder, urethra, or kidneys.  You may have urine tests to check for signs of infection.  Plan to have someone take you home after the procedure. What happens during the procedure?  To reduce your risk of infection,your health care team will wash or sanitize their hands.  You will be given one or more of the following: ? A medicine to help you relax (  sedative). ? A medicine to numb the area (local anesthetic).  The area around the opening of your urethra will be cleaned.  The cystoscope will be passed through your urethra into your bladder.  Germ-free (sterile)fluid will flow through the cystoscope to fill your bladder. The fluid will stretch your bladder so that your surgeon can clearly examine your bladder walls.  The cystoscope will be removed and your  bladder will be emptied. The procedure may vary among health care providers and hospitals. What happens after the procedure?  You may have some soreness or pain in your abdomen and urethra. Medicines will be available to help you.  You may have some blood in your urine.  Do not drive for 24 hours if you received a sedative. This information is not intended to replace advice given to you by your health care provider. Make sure you discuss any questions you have with your health care provider. Document Released: 12/13/2000 Document Revised: 09/26/2017 Document Reviewed: 11/02/2015 Elsevier Interactive Patient Education  2019 [place] Anesthesia, Adult, Care After This sheet gives you information about how to care for yourself after your procedure. Your health care provider may also give you more specific instructions. If you have problems or questions, contact your health care provider. What can I expect after the procedure? After the procedure, the following side effects are common:  Pain or discomfort at the IV site.  Nausea.  Vomiting.  Sore throat.  Trouble concentrating.  Feeling cold or chills.  Weak or tired.  Sleepiness and fatigue.  Soreness and body aches. These side effects can affect parts of the body that were not involved in surgery. Follow these instructions at home:  For at least 24 hours after the procedure:  Have a responsible adult stay with you. It is important to have someone help care for you until you are awake and alert.  Rest as needed.  Do not: ? Participate in activities in which you could fall or become injured. ? Drive. ? Use heavy machinery. ? Drink alcohol. ? Take sleeping pills or medicines that cause drowsiness. ? Make important decisions or sign legal documents. ? Take care of children on your own. Eating and drinking  Follow any instructions from your health care provider about eating or drinking restrictions.   When you feel hungry, start by eating small amounts of foods that are soft and easy to digest (bland), such as toast. Gradually return to your regular diet.  Drink enough fluid to keep your urine pale yellow.  If you vomit, rehydrate by drinking water, juice, or clear broth. General instructions  If you have sleep apnea, surgery and certain medicines can increase your risk for breathing problems. Follow instructions from your health care provider about wearing your sleep device: ? Anytime you are sleeping, including during daytime naps. ? While taking prescription pain medicines, sleeping medicines, or medicines that make you drowsy.  Return to your normal activities as told by your health care provider. Ask your health care provider what activities are safe for you.  Take over-the-counter and prescription medicines only as told by your health care provider.  If you smoke, do not smoke without supervision.  Keep all follow-up visits as told by your health care provider. This is important. Contact a health care provider if:  You have nausea or vomiting that does not get better with medicine.  You cannot eat or drink without vomiting.  You have pain that does not get better with  medicine.  You are unable to pass urine.  You develop a skin rash.  You have a fever.  You have redness around your IV site that gets worse. Get help right away if:  You have difficulty breathing.  You have chest pain.  You have blood in your urine or stool, or you vomit blood. Summary  After the procedure, it is common to have a sore throat or nausea. It is also common to feel tired.  Have a responsible adult stay with you for the first 24 hours after general anesthesia. It is important to have someone help care for you until you are awake and alert.  When you feel hungry, start by eating small amounts of foods that are soft and easy to digest (bland), such as toast. Gradually return to your  regular diet.  Drink enough fluid to keep your urine pale yellow.  Return to your normal activities as told by your health care provider. Ask your health care provider what activities are safe for you. This information is not intended to replace advice given to you by your health care provider. Make sure you discuss any questions you have with your health care provider. Document Released: 03/24/2001 Document Revised: 08/01/2017 Document Reviewed: 08/01/2017 Elsevier Interactive Patient Education  2019 [employer].

## 2019-03-10 NOTE — Assessment & Plan Note (Signed)
He was diagnosed with OSA in the past but was intolerant of CPAP.  He is willing to try again.  It does seem like he had severe OSA although I do not have prior sleep studies available to me at this time. We will proceed with home sleep study even though he has underlying pulmonary disease since he does not seem to require oxygen.  Once diagnosis is established, he will likely need an attended CPAP titration both for mask desensitization as well as due to his underlying cardiopulmonary disease   The pathophysiology of obstructive sleep apnea , it's cardiovascular consequences & modes of treatment including CPAP were discused with the patient in detail & they evidenced understanding. Does seem to be a mouth breather and will need a full facemask

## 2019-03-10 NOTE — Progress Notes (Signed)
Subjective:    Patient ID: [name], male    DOB: Apr 23, 1951, 68 y.o.   MRN: [id number]  HPI  Chief Complaint  Patient presents with  . Sleep Consult    Referred by MR for possible OSA. States he was told that he has severe OSA. Has tried the CPAP before but the mask did not fit both times.    68 year old neck smoker with moderate COPD and suspected ILD presents for evaluation of sleep disordered breathing. He was diagnosed with OSA and was placed on CPAP more than 7 years ago he was told that he had 58 events through the night.  Unfortunately he could not tolerate full facemask due to mask leak and returned the machine.  He is willing to try again.  Wife reports loud snoring and excessive daytime somnolence and fatigue. Epworth sleepiness score is 9 and he reports sleepiness while sitting and reading, watching TV.  Bedtime is between midnight and 3 AM, he often falls asleep in the living room while watching TV in his recliner.  He then gets into bed and generally sleeps on his side with one pillow.  Reports 3-4 nocturnal awakenings including nocturia and is out of bed anywhere between 8 AM to 11 AM feeling tired with dryness of mouth but denies headaches. He has gained 5 pounds over the last 2 years. There is no history suggestive of cataplexy, sleep paralysis or parasomnias   He has chronic bilateral lower extremity edema due to blood clots    PMH significant for moderate COPD, GERD, CHF, DVT, seizure disorder, RA, prostate cancer.      Significant tests/ events reviewed  CT chest in December showed emphysema, mild bronchiectasis and peripheral subpleural reticulation within right lung base which appears slightly progressive.   PFTs 02/08/2019 - FVC 2.24 (51%), FEV1 1.90 (59%), ratio 85, DLCOunc 71  PFTs 11/26/2017 - FVC 2.11 (48%), FEV1 1.77 (54%), ratio 84, DLCOcor 63   Past Medical History:  Diagnosis Date  . Anxiety    hx of   . Arthritis   . Asthma   .  Clotting disorder (HCC)    DVT both legs   . COPD (chronic obstructive pulmonary disease) ([place])   . DDD (degenerative disc disease), cervical    with UE's paresthesias  . Diverticulosis   . DVT, lower extremity ([place])    bilat  . 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 GERD (gastroesophageal reflux disease)   . Gout   . Heart murmur   . History of measles   . History of shingles   . Hypercholesterolemia   . IBS (irritable bowel syndrome)   . IBS (irritable bowel syndrome)   . Peripheral edema   . Pneumonia    hx of   . Prostate cancer ([place])   . Seizures ([place])    last seizure 20 years ago   . Shortness of breath dyspnea    exertion   . Sleep apnea    not on cpap  . Tubular adenoma of colon 04/2008    Past Surgical History:  Procedure Laterality Date  . CHOLECYSTECTOMY    . COLONOSCOPY    . DOPPLER ECHOCARDIOGRAPHY N/A 01-21-2012   TECHNICALLY DIFFICULT. MILD CONCENTRIC LV HYPERTROPHY. LV CAVITY IS SMALL.. EF=> 55%. TRANSMITRAL SPECTRAL FLOW PATTREN IS SUGGESTIVE OF IMPAIRED LV RELAXATION. RV SYSTOLIC PRESSURE IS 53IRWE. LEFT ATRIAL SIZE IS NORMAL. AV APPEARS MILDLY SCLEROTIC.  NO SIGN VALVE DISEASE NOTED.  [name] LYMPHADENECTOMY Bilateral 08/30/2015   Procedure: PELVIC LYMPHADENECTOMY;  Surgeon: [name], MD;  Location: [facility];  Service: Urology;  Laterality: Bilateral;  . NUCLEAR STRESS TEST N/A 01-21-2012   NORMAL PATTERN OF PERFUSION IN ALL REGIONS. POST STRESS LV SIZE IS NORMAL. NO EVIDENCE OF INDUCIBLE ISCHEMIA. EF 54%.  [name] POLYPECTOMY    . PROSTATE BIOPSY    . ROBOT ASSISTED LAPAROSCOPIC RADICAL PROSTATECTOMY N/A 08/30/2015   Procedure: ROBOTIC ASSISTED LAPAROSCOPIC RADICAL PROSTATECTOMY;  Surgeon: [name], MD;  Location: [facility];  Service: Urology;  Laterality: N/A;  . US VENOUS LOWER EXT Right 03/07/11   PERSISTENT DVT IN RIGHT LOWER EXT.WITH PERSISTANT VISUALIZATION OF HYPOECHOIC THROMBUS WITHIN THE  FEMORAL, PROFUNDA FEMORAL AND POPLITEAL VEINS. WHEN COMPARED TO PREVIOUS, CLOT IS NO LONGER IDENTIFIED WITH IN THE RIGHT CFV.    Allergies  Allergen Reactions  . Orencia [Abatacept] Anaphylaxis    Had prostate cancer  . Carbamazepine Rash    REACTION: makes drowsy BRAND NAME IS TEGRETOL  . Celecoxib Itching and Rash    BRAND NAME IS CELEBREX  . Cephalexin Rash    "Severe Rash".  BRAND NAME IS KEFLEX.   . Enbrel [Etanercept] Rash  . Humira [Adalimumab] Rash  . Levofloxacin Other (See Comments)    REACTION: GI Intolerance BRAND NAME IS LEVAQUIN  . Sulfa Antibiotics Rash  . Sulfasalazine Rash    Social History   Socioeconomic History  . Marital status: Married    Spouse name: Not on file  . Number of children: 3  . Years of education: Not on file  . Highest education level: Not on file  Occupational History  . Occupation: retired    Fish farm manager: [employer]  Social Needs  . Financial resource strain: Not on file  . Food insecurity:    Worry: Not on file    Inability: Not on file  . Transportation needs:    Medical: Not on file    Non-medical: Not on file  Tobacco Use  . Smoking status: Former Smoker    Packs/day: 3.00    Years: 20.00    Pack years: 60.00    Types: Cigarettes    Last attempt to quit: 12/30/1996    Years since quitting: 22.2  . Smokeless tobacco: Never Used  Substance and Sexual Activity  . Alcohol use: No    Alcohol/week: 0.0 standard drinks  . Drug use: No  . Sexual activity: Yes  Lifestyle  . Physical activity:    Days per week: Not on file    Minutes per session: Not on file  . Stress: Not on file  Relationships  . Social connections:    Talks on phone: Not on file    Gets together: Not on file    Attends religious service: Not on file    Active member of club or organization: Not on file    Attends meetings of clubs or organizations: Not on file    Relationship status: Not on file  . Intimate partner violence:    Fear of current or ex  partner: Not on file    Emotionally abused: Not on file    Physically abused: Not on file    Forced sexual activity: Not on file  Other Topics Concern  . Not on file  Social History Narrative  . Not on file     Family History  Problem Relation Age of Onset  . Skin cancer Father   . Stomach  cancer Father   . Heart attack Father   . Alzheimer's disease Mother   . Throat cancer Brother   . Stomach cancer Brother   . Lung cancer Brother   . Heart attack Brother   . Heart attack Brother   . Breast cancer Sister   . Heart attack Sister   . Stroke Sister   . Bladder Cancer Sister   . Heart murmur Sister   . Colon cancer Brother   . Colon polyps Neg Hx     Review of Systems  Positive for dyspnea on exertion, dry cough and pedal edema  Constitutional: negative for anorexia, fevers and sweats  Eyes: negative for irritation, redness and visual disturbance  Ears, nose, mouth, throat, and face: negative for earaches, epistaxis, nasal congestion and sore throat  Respiratory: negative for , sputum and wheezing  Cardiovascular: negative for chest pain,  orthopnea, palpitations and syncope  Gastrointestinal: negative for abdominal pain, constipation, diarrhea, melena, nausea and vomiting  Genitourinary:negative for dysuria, frequency and hematuria  Hematologic/lymphatic: negative for bleeding, easy bruising and lymphadenopathy  Musculoskeletal:negative for arthralgias, muscle weakness and stiff joints  Neurological: negative for coordination problems, gait problems, headaches and weakness  Endocrine: negative for diabetic symptoms including polydipsia, polyuria and weight loss     Objective:   Physical Exam  Gen. Pleasant, obese, in no distress, normal affect ENT - no pallor,icterus, no post nasal drip, class 2-3 airway Neck: No JVD, no thyromegaly, no carotid bruits Lungs: no use of accessory muscles, no dullness to percussion, decreased without rales or rhonchi  Cardiovascular:  Rhythm regular, heart sounds  normal, no murmurs or gallops, no peripheral edema Abdomen: soft and non-tender, no hepatosplenomegaly, BS normal. Musculoskeletal: No deformities, no cyanosis or clubbing Neuro:  alert, non focal, no tremors       Assessment & Plan:

## 2019-03-11 NOTE — Progress Notes (Signed)
The patient has been notified of the result and verbalized understanding.  All questions (if any) were answered. Jacqulynn Cadet, [place] 03/11/2019 3:26 PM

## 2019-03-12 ENCOUNTER — Other Ambulatory Visit: Payer: Self-pay | Admitting: Rheumatology

## 2019-03-12 NOTE — Telephone Encounter (Signed)
Last Visit: 02/03/19 Next visit: 07/05/19 Labs: 02/22/19 CBC WNL. Glucose is 112. Rest of CMP WNL.  Okay to refill per Dr. Estanislado Pandy

## 2019-03-15 ENCOUNTER — Encounter (HOSPITAL_COMMUNITY)
Admission: RE | Admit: 2019-03-15 | Discharge: 2019-03-15 | Disposition: A | Discharge status: Home / Self Care | Payer: Medicare Other | Source: Ambulatory Visit | Attending: Urology | Admitting: Urology

## 2019-03-15 ENCOUNTER — Encounter: Payer: Self-pay | Admitting: Cardiovascular Disease

## 2019-03-15 ENCOUNTER — Other Ambulatory Visit: Payer: Self-pay

## 2019-03-15 ENCOUNTER — Encounter (HOSPITAL_COMMUNITY): Payer: Self-pay

## 2019-03-15 DIAGNOSIS — J449 Chronic obstructive pulmonary disease, unspecified: Secondary | ICD-10-CM | POA: Diagnosis not present

## 2019-03-15 DIAGNOSIS — Z79899 Other long term (current) drug therapy: Secondary | ICD-10-CM | POA: Diagnosis not present

## 2019-03-15 DIAGNOSIS — M069 Rheumatoid arthritis, unspecified: Secondary | ICD-10-CM | POA: Diagnosis not present

## 2019-03-15 DIAGNOSIS — T190XXA Foreign body in urethra, initial encounter: Secondary | ICD-10-CM | POA: Diagnosis not present

## 2019-03-15 DIAGNOSIS — X58XXXA Exposure to other specified factors, initial encounter: Secondary | ICD-10-CM | POA: Diagnosis not present

## 2019-03-15 DIAGNOSIS — Z8546 Personal history of malignant neoplasm of prostate: Secondary | ICD-10-CM | POA: Diagnosis not present

## 2019-03-15 DIAGNOSIS — Z01812 Encounter for preprocedural laboratory examination: Secondary | ICD-10-CM

## 2019-03-15 DIAGNOSIS — K219 Gastro-esophageal reflux disease without esophagitis: Secondary | ICD-10-CM | POA: Diagnosis not present

## 2019-03-15 DIAGNOSIS — I509 Heart failure, unspecified: Secondary | ICD-10-CM | POA: Diagnosis not present

## 2019-03-15 DIAGNOSIS — Z86718 Personal history of other venous thrombosis and embolism: Secondary | ICD-10-CM | POA: Diagnosis not present

## 2019-03-15 DIAGNOSIS — G4730 Sleep apnea, unspecified: Secondary | ICD-10-CM | POA: Diagnosis not present

## 2019-03-15 DIAGNOSIS — R569 Unspecified convulsions: Secondary | ICD-10-CM | POA: Diagnosis not present

## 2019-03-15 DIAGNOSIS — N211 Calculus in urethra: Secondary | ICD-10-CM | POA: Diagnosis not present

## 2019-03-15 DIAGNOSIS — Z87891 Personal history of nicotine dependence: Secondary | ICD-10-CM | POA: Diagnosis not present

## 2019-03-15 LAB — BASIC METABOLIC PANEL
Anion gap: 9 (ref 5–15)
BUN: 16 mg/dL (ref 8–23)
CO2: 30 mmol/L (ref 22–32)
Calcium: 8.8 mg/dL — ABNORMAL LOW (ref 8.9–10.3)
Chloride: 103 mmol/L (ref 98–111)
Creatinine, Ser: 1.17 mg/dL (ref 0.61–1.24)
GFR calc Af Amer: >60 mL/min (ref >60)
GFR calc non Af Amer: >60 mL/min (ref >60)
Glucose, Bld: 137 mg/dL — ABNORMAL HIGH (ref 70–99)
Potassium: 4 mmol/L (ref 3.5–5.1)
Sodium: 142 mmol/L (ref 135–145)

## 2019-03-15 NOTE — Pre-Procedure Instructions (Signed)
In for PAT wearing Heart monitor for 14 days, which will end next week. Reviewed history with Dr Hilaria Ota who requests that  Cardiology be notified of this low risk procedure and make sure with them that it is ok to proceed with surgery on 03/17/2019. Note sent to cardiology about this.

## 2019-03-17 ENCOUNTER — Ambulatory Visit (HOSPITAL_COMMUNITY): Payer: Medicare Other | Admitting: Anesthesiology

## 2019-03-17 ENCOUNTER — Other Ambulatory Visit: Payer: Self-pay

## 2019-03-17 ENCOUNTER — Ambulatory Visit (HOSPITAL_COMMUNITY)
Admission: RE | Admit: 2019-03-17 | Discharge: 2019-03-17 | Disposition: A | Discharge status: Home / Self Care | Payer: Medicare Other | Attending: Urology | Admitting: Urology

## 2019-03-17 ENCOUNTER — Encounter (HOSPITAL_COMMUNITY)
Admission: RE | Disposition: A | Discharge status: Home / Self Care | Payer: Self-pay | Source: Home / Self Care | Attending: Urology

## 2019-03-17 ENCOUNTER — Encounter (HOSPITAL_COMMUNITY): Payer: Self-pay | Admitting: *Deleted

## 2019-03-17 DIAGNOSIS — T191XXA Foreign body in bladder, initial encounter: Secondary | ICD-10-CM | POA: Diagnosis not present

## 2019-03-17 DIAGNOSIS — Z86718 Personal history of other venous thrombosis and embolism: Secondary | ICD-10-CM | POA: Insufficient documentation

## 2019-03-17 DIAGNOSIS — I509 Heart failure, unspecified: Secondary | ICD-10-CM | POA: Diagnosis not present

## 2019-03-17 DIAGNOSIS — T190XXA Foreign body in urethra, initial encounter: Secondary | ICD-10-CM | POA: Insufficient documentation

## 2019-03-17 DIAGNOSIS — G4730 Sleep apnea, unspecified: Secondary | ICD-10-CM | POA: Insufficient documentation

## 2019-03-17 DIAGNOSIS — Z79899 Other long term (current) drug therapy: Secondary | ICD-10-CM | POA: Insufficient documentation

## 2019-03-17 DIAGNOSIS — N211 Calculus in urethra: Secondary | ICD-10-CM | POA: Diagnosis not present

## 2019-03-17 DIAGNOSIS — K219 Gastro-esophageal reflux disease without esophagitis: Secondary | ICD-10-CM | POA: Diagnosis not present

## 2019-03-17 DIAGNOSIS — N210 Calculus in bladder: Secondary | ICD-10-CM | POA: Diagnosis not present

## 2019-03-17 DIAGNOSIS — Z87891 Personal history of nicotine dependence: Secondary | ICD-10-CM | POA: Insufficient documentation

## 2019-03-17 DIAGNOSIS — M069 Rheumatoid arthritis, unspecified: Secondary | ICD-10-CM | POA: Diagnosis not present

## 2019-03-17 DIAGNOSIS — R569 Unspecified convulsions: Secondary | ICD-10-CM | POA: Diagnosis not present

## 2019-03-17 DIAGNOSIS — Z8546 Personal history of malignant neoplasm of prostate: Secondary | ICD-10-CM | POA: Insufficient documentation

## 2019-03-17 DIAGNOSIS — J449 Chronic obstructive pulmonary disease, unspecified: Secondary | ICD-10-CM | POA: Diagnosis not present

## 2019-03-17 DIAGNOSIS — X58XXXA Exposure to other specified factors, initial encounter: Secondary | ICD-10-CM | POA: Insufficient documentation

## 2019-03-17 HISTORY — PX: CYSTOSCOPY WITH LITHOLAPAXY: SHX1425

## 2019-03-17 LAB — PROTIME-INR
INR: 1.6 — ABNORMAL HIGH (ref 0.8–1.2)
Prothrombin Time: 18.7 seconds — ABNORMAL HIGH (ref 11.4–15.2)

## 2019-03-17 SURGERY — CYSTOSCOPY, WITH BLADDER CALCULUS LITHOLAPAXY
Anesthesia: General

## 2019-03-17 MED ORDER — PROPOFOL 10 MG/ML IV BOLUS
INTRAVENOUS | Status: AC
  Filled 2019-03-17: qty 40

## 2019-03-17 MED ORDER — HYDROCODONE-ACETAMINOPHEN 7.5-325 MG PO TABS: 1.0000 | ORAL_TABLET | Freq: Once | ORAL | Status: DC | PRN

## 2019-03-17 MED ORDER — MIDAZOLAM HCL 2 MG/2ML IJ SOLN: 0.5000 mg | Freq: Once | INTRAMUSCULAR | Status: DC | PRN

## 2019-03-17 MED ORDER — FENTANYL CITRATE (PF) 250 MCG/5ML IJ SOLN
INTRAMUSCULAR | Status: AC
  Filled 2019-03-17: qty 5

## 2019-03-17 MED ORDER — LACTATED RINGERS IV SOLN
INTRAVENOUS | Status: DC
  Administered 2019-03-17: 1000 mL via INTRAVENOUS

## 2019-03-17 MED ORDER — TRAMADOL HCL 50 MG PO TABS: 50.0000 mg | ORAL_TABLET | Freq: Four times a day (QID) | ORAL | 0 refills | Status: DC | PRN

## 2019-03-17 MED ORDER — PROPOFOL 10 MG/ML IV BOLUS
INTRAVENOUS | Status: DC | PRN
  Administered 2019-03-17: 30 mg via INTRAVENOUS
  Administered 2019-03-17: 200 mg via INTRAVENOUS

## 2019-03-17 MED ORDER — CEFAZOLIN SODIUM-DEXTROSE 2-4 GM/100ML-% IV SOLN
2.0000 g | INTRAVENOUS | Status: AC
  Administered 2019-03-17: 2 g via INTRAVENOUS
  Filled 2019-03-17: qty 100

## 2019-03-17 MED ORDER — SODIUM CHLORIDE 0.9 % IR SOLN
Status: DC | PRN
  Administered 2019-03-17 (×2): 3000 mL via INTRAVESICAL

## 2019-03-17 MED ORDER — FENTANYL CITRATE (PF) 100 MCG/2ML IJ SOLN
INTRAMUSCULAR | Status: DC | PRN
  Administered 2019-03-17: 50 ug via INTRAVENOUS

## 2019-03-17 MED ORDER — WATER FOR IRRIGATION, STERILE IR SOLN
Status: DC | PRN
  Administered 2019-03-17: 1000 mL

## 2019-03-17 MED ORDER — HYDROMORPHONE HCL 1 MG/ML IJ SOLN: 0.2500 mg | INTRAMUSCULAR | Status: DC | PRN

## 2019-03-17 MED ORDER — PROMETHAZINE HCL 25 MG/ML IJ SOLN: 6.2500 mg | INTRAMUSCULAR | Status: DC | PRN

## 2019-03-17 SURGICAL SUPPLY — 16 items
BAG DRAIN URO TABLE W/ADPT NS (BAG) ×2 IMPLANT
BAG DRN 8 ADPR NS SKTRN CSTL (BAG) ×1
BAG URINE DRAINAGE (UROLOGICAL SUPPLIES) ×1 IMPLANT
CATH FOLEY 2WAY SLVR  5CC 18FR (CATHETERS) ×1
CATH FOLEY 2WAY SLVR 5CC 18FR (CATHETERS) IMPLANT
GLOVE BIO SURGEON STRL SZ8 (GLOVE) ×2 IMPLANT
GLOVE BIOGEL M 7.0 STRL (GLOVE) ×1 IMPLANT
GLOVE BIOGEL PI IND STRL 7.0 (GLOVE) ×1 IMPLANT
GLOVE BIOGEL PI INDICATOR 7.0 (GLOVE) ×2
GOWN STRL REUS W/TWL LRG LVL3 (GOWN DISPOSABLE) ×2 IMPLANT
GOWN STRL REUS W/TWL XL LVL3 (GOWN DISPOSABLE) ×2 IMPLANT
IV NS IRRIG 3000ML ARTHROMATIC (IV SOLUTION) ×2 IMPLANT
KIT TURNOVER CYSTO (KITS) ×2 IMPLANT
PACK CYSTO (CUSTOM PROCEDURE TRAY) ×2 IMPLANT
PAD ARMBOARD 7.5X6 YLW CONV (MISCELLANEOUS) ×2 IMPLANT
WATER STERILE IRR 1000ML POUR (IV SOLUTION) ×2 IMPLANT

## 2019-03-17 NOTE — Op Note (Signed)
Preoperative diagnosis: urethral calculus  Postoperative diagnosis: staple eroded into urethra, urethral calculus  Procedure: 1 cystoscopy 2. cystolithalopaxy for a stone less than 2cm 3. Foreign body removal from urethra  Attending: Nicolette Bang  Anesthesia: General  Estimated blood loss: Minimal  Drains: 18 french foley  Specimens: 1. Bladder calculus  Antibiotics: ancef  Findings:  Ureteral orifices in normal anatomic location.  1cm urethral calculus. 4 staple fragments removed from anterior urethra  Indications: Patient is a 68 year old male with a history of gross hematuria who was found to have a urethral calculus on cystoscopy.  After discussing treatment options, they decided proceed with cystolithalopaxy.  Procedure her in detail: The patient was brought to the operating room and a brief timeout was done to ensure correct patient, correct procedure, correct site.  General anesthesia was administered patient was placed in dorsal lithotomy position.  Their genitalia was then prepped and draped in usual sterile fashion.  A rigid 32 French cystoscope was passed in the urethra and the bladder.  Bladder was inspected and we noted no masses or lesions.  the ureteral orifices were in the normal orthotopic locations. Using the rigid grasper the urethral calculus was fragmented and the fragments were then irrigated from the bladder. We then removed 4 pieces of eroded staple from the anterior urethra. The stone fragments were the sent for analysis. The bladder was then drained and this concluded the procedure which was well tolerated by patient.  Complications: None  Condition: Stable, extubated, transferred to PACU  Plan: Patient is to be discharge home. He is to followup in 2 days for a voiding trial. He will followup in 2 weeks for stone analysis discussion

## 2019-03-17 NOTE — Transfer of Care (Signed)
Immediate Anesthesia Transfer of Care Note  Patient: [name]  Procedure(s) Performed: CYSTOSCOPY WITH LITHOLAPAXY AND REMOVAL OF FOREIGN BODY (N/A )  Patient Location: PACU  Anesthesia Type:General  Level of Consciousness: awake, alert  and oriented  Airway & Oxygen Therapy: Patient Spontanous Breathing  Post-op Assessment: Report given to RN  Post vital signs: Reviewed  Last Vitals:  Vitals Value Taken Time  BP 164/98 03/17/2019  1:28 PM  Temp    Pulse 81 03/17/2019  1:31 PM  Resp 25 03/17/2019  1:31 PM  SpO2 91 % 03/17/2019  1:31 PM  Vitals shown include unvalidated device data.  Last Pain:  Vitals:   03/17/19 1056  TempSrc: Oral  PainSc: 0-No pain      Patients Stated Pain Goal: 9 (22/77/37 [id number])  Complications: No apparent anesthesia complications

## 2019-03-17 NOTE — Anesthesia Procedure Notes (Signed)
Procedure Name: LMA Insertion Date/Time: 03/17/2019 12:41 PM Performed by: Ollen Bowl, CRNA Pre-anesthesia Checklist: Patient identified, Patient being monitored, Emergency Drugs available, Timeout performed and Suction available Patient Re-evaluated:Patient Re-evaluated prior to induction Oxygen Delivery Method: Circle System Utilized Preoxygenation: Pre-oxygenation with 100% oxygen Induction Type: IV induction Ventilation: Mask ventilation without difficulty LMA: LMA inserted LMA Size: 4.0 Number of attempts: 1 Placement Confirmation: positive ETCO2 and breath sounds checked- equal and bilateral

## 2019-03-17 NOTE — Anesthesia Postprocedure Evaluation (Signed)
Anesthesia Post Note  Patient: [name]  Procedure(s) Performed: CYSTOSCOPY WITH LITHOLAPAXY AND REMOVAL OF FOREIGN BODY (N/A )  Patient location during evaluation: Short Stay Anesthesia Type: General Level of consciousness: awake and alert and patient cooperative Pain management: satisfactory to patient Vital Signs Assessment: post-procedure vital signs reviewed and stable Respiratory status: spontaneous breathing Cardiovascular status: stable Postop Assessment: no apparent nausea or vomiting Anesthetic complications: no     Last Vitals:  Vitals:   03/17/19 1400 03/17/19 1408  BP: (!) 169/101 (!) 164/98  Pulse: 72 74  Resp: 18 20  Temp:  36.6 C  SpO2: 91% 94%    Last Pain:  Vitals:   03/17/19 1408  TempSrc: Oral  PainSc: 0-No pain                 [name]

## 2019-03-17 NOTE — Anesthesia Preprocedure Evaluation (Addendum)
Anesthesia Evaluation  Patient identified by MRN, date of birth, ID band Patient awake    Reviewed: Allergy & Precautions, NPO status , Patient's Chart, lab work & pertinent test results  Airway Mallampati: II  TM Distance: >3 FB Neck ROM: Full    Dental no notable dental hx. (+) Edentulous Upper   Pulmonary shortness of breath and with exertion, asthma , sleep apnea and Continuous Positive Airway Pressure Ventilation , pneumonia, resolved, COPD,  COPD inhaler, former smoker,    Pulmonary exam normal breath sounds clear to auscultation       Cardiovascular Exercise Tolerance: Good +CHF  Normal cardiovascular examI Rhythm:Regular Rate:Normal     Neuro/Psych Seizures -, Well Controlled,  Anxiety negative psych ROS   GI/Hepatic Neg liver ROS, GERD  Medicated and Controlled,  Endo/Other  negative endocrine ROS  Renal/GU negative Renal ROS  negative genitourinary   Musculoskeletal  (+) Arthritis , Osteoarthritis,    Abdominal   Peds negative pediatric ROS (+)  Hematology negative hematology ROS (+)   Anesthesia Other Findings   Reproductive/Obstetrics negative OB ROS                            Anesthesia Physical Anesthesia Plan  ASA: IV  Anesthesia Plan: General   Post-op Pain Management:    Induction: Intravenous  PONV Risk Score and Plan:   Airway Management Planned: Oral ETT and LMA  Additional Equipment:   Intra-op Plan:   Post-operative Plan: Extubation in OR  Informed Consent: I have reviewed the patients History and Physical, chart, labs and discussed the procedure including the risks, benefits and alternatives for the proposed anesthesia with the patient or authorized representative who has indicated his/her understanding and acceptance.     Dental advisory given  Plan Discussed with: CRNA  Anesthesia Plan Comments: (GA planned -LMA vs ETT as needed)         Anesthesia Quick Evaluation

## 2019-03-17 NOTE — H&P (Signed)
Urology Admission H&P  Chief Complaint: hematuria  History of Present Illness: [name] is a 68yo with a hx of gross hematuria who was found to have a urethral stone. He has severe LUTS and continuous urinary incontinence.   Past Medical History:  Diagnosis Date  . Anxiety    hx of   . Arthritis    RA  . Asthma   . Clotting disorder (HCC)    DVT both legs   . COPD (chronic obstructive pulmonary disease) ([place])   . DDD (degenerative disc disease), cervical    with UE's paresthesias  . Diverticulosis   . DVT, lower extremity ([place])    bilat  . 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 GERD (gastroesophageal reflux disease)   . Gout   . Heart murmur   . History of measles   . History of shingles   . Hypercholesterolemia   . IBS (irritable bowel syndrome)   . IBS (irritable bowel syndrome)   . Peripheral edema   . Pneumonia    hx of   . Prostate cancer ([place])   . Seizures ([place])    last seizure 20 years ago; on dilantin. Unknown origin.  [name] Shortness of breath dyspnea    exertion   . Sleep apnea    not on cpap  . Tubular adenoma of colon 04/2008   Past Surgical History:  Procedure Laterality Date  . CHOLECYSTECTOMY    . COLONOSCOPY    . DOPPLER ECHOCARDIOGRAPHY N/A 01-21-2012   TECHNICALLY DIFFICULT. MILD CONCENTRIC LV HYPERTROPHY. LV CAVITY IS SMALL.. EF=> 55%. TRANSMITRAL SPECTRAL FLOW PATTREN IS SUGGESTIVE OF IMPAIRED LV RELAXATION. RV SYSTOLIC PRESSURE IS 70YFVC. LEFT ATRIAL SIZE IS NORMAL. AV APPEARS MILDLY SCLEROTIC. NO SIGN VALVE DISEASE NOTED.  [name] LYMPHADENECTOMY Bilateral 08/30/2015   Procedure: PELVIC LYMPHADENECTOMY;  Surgeon: [name], MD;  Location: [facility];  Service: Urology;  Laterality: Bilateral;  . NUCLEAR STRESS TEST N/A 01-21-2012   NORMAL PATTERN OF PERFUSION IN ALL REGIONS. POST STRESS LV SIZE IS NORMAL. NO EVIDENCE OF INDUCIBLE ISCHEMIA. EF 54%.  [name] POLYPECTOMY    . PROSTATE BIOPSY    .  ROBOT ASSISTED LAPAROSCOPIC RADICAL PROSTATECTOMY N/A 08/30/2015   Procedure: ROBOTIC ASSISTED LAPAROSCOPIC RADICAL PROSTATECTOMY;  Surgeon: [name], MD;  Location: [facility];  Service: Urology;  Laterality: N/A;  . US VENOUS LOWER EXT Right 03/07/11   PERSISTENT DVT IN RIGHT LOWER EXT.WITH PERSISTANT VISUALIZATION OF HYPOECHOIC THROMBUS WITHIN THE FEMORAL, PROFUNDA FEMORAL AND POPLITEAL VEINS. WHEN COMPARED TO PREVIOUS, CLOT IS NO LONGER IDENTIFIED WITH IN THE RIGHT CFV.    Home Medications:  Current Facility-Administered Medications  Medication Dose Route Frequency Provider Last Rate Last Dose  . ceFAZolin (ANCEF) IVPB 2g/100 mL premix  2 g Intravenous 30 min Pre-Op Damoney Julia, Candee Furbish, MD      . lactated ringers infusion   Intravenous Continuous Lenice Llamas, MD 50 mL/hr at 03/17/19 1105 1,000 mL at 03/17/19 1105  . water for irrigation, sterile for irrigation SOLN    PRN [name], MD   1,000 mL at 03/17/19 1210   Allergies:  Allergies  Allergen Reactions  . Orencia [Abatacept] Anaphylaxis    Had prostate cancer  . Carbamazepine Rash    REACTION: makes drowsy BRAND NAME IS TEGRETOL  . Celecoxib Itching and Rash    BRAND NAME IS CELEBREX  . Cephalexin Rash    "Severe  Rash".  BRAND NAME IS KEFLEX.   . Enbrel [Etanercept] Rash  . Humira [Adalimumab] Rash  . Levofloxacin Other (See Comments)    REACTION: GI Intolerance BRAND NAME IS LEVAQUIN  . Sulfa Antibiotics Rash  . Sulfasalazine Rash    Family History  Problem Relation Age of Onset  . Skin cancer Father   . Stomach cancer Father   . Heart attack Father   . Alzheimer's disease Mother   . Throat cancer Brother   . Stomach cancer Brother   . Lung cancer Brother   . Heart attack Brother   . Heart attack Brother   . Breast cancer Sister   . Heart attack Sister   . Stroke Sister   . Bladder Cancer Sister   . Heart murmur Sister   . Colon cancer Brother   . Colon polyps Neg Hx    Social History:   reports that he quit smoking about 22 years ago. His smoking use included cigarettes. He has a 60.00 pack-year smoking history. He has never used smokeless tobacco. He reports that he does not drink alcohol or use drugs.  Review of Systems  Genitourinary: Positive for dysuria, hematuria and urgency.  All other systems reviewed and are negative.   Physical Exam:  Vital signs in last 24 hours: Temp:  [98.1 F (36.7 C)] 98.1 F (36.7 C) (03/18 1056) Pulse Rate:  [72] 72 (03/18 1056) Resp:  [19] 19 (03/18 1056) BP: (171)/(86) 171/86 (03/18 1056) SpO2:  [96 %] 96 % (03/18 1056) Physical Exam  Constitutional: He is oriented to person, place, and time. He appears well-developed and well-nourished.  HENT:  Head: Normocephalic and atraumatic.  Eyes: Pupils are equal, round, and reactive to light. EOM are normal.  Neck: Normal range of motion. No thyromegaly present.  Cardiovascular: Normal rate and regular rhythm.  Respiratory: Effort normal. No respiratory distress.  GI: Soft. He exhibits no distension.  Musculoskeletal: Normal range of motion.        General: No edema.  Neurological: He is alert and oriented to person, place, and time.  Skin: Skin is warm and dry.  Psychiatric: He has a normal mood and affect. His behavior is normal. Judgment and thought content normal.    Laboratory Data:  Results for orders placed or performed during the hospital encounter of 03/17/19 (from the past 24 hour(s))  Protime-INR     Status: Abnormal   Collection Time: 03/17/19 11:19 AM  Result Value Ref Range   Prothrombin Time 18.7 (H) 11.4 - 15.2 seconds   INR 1.6 (H) 0.8 - 1.2   No results found for this or any previous visit (from the past 240 hour(s)). Creatinine: Recent Labs    03/15/19 1400  CREATININE 1.17   Baseline Creatinine: 1.1  Impression/Assessment:  67yo with a urethral calculus  Plan:  The risks/benefits/alternatives to urethral calculus removal was explained to the patient  and he understands and wishes to proceed with surgery  Nicolette Bang 03/17/2019, 12:19 PM

## 2019-03-17 NOTE — Discharge Instructions (Signed)
Indwelling Urinary Catheter Care, Adult An indwelling urinary catheter is a thin tube that is put into your bladder. The tube helps to drain pee (urine) out of your body. The tube goes in through your urethra. Your urethra is where pee comes out of your body. Your pee will come out through the catheter, then it will go into a bag (drainage bag). Take good care of your catheter so it will work well. How to wear your catheter and bag Supplies needed  Sticky tape (adhesive tape) or a leg strap.  Alcohol wipe or soap and water (if you use tape).  A clean towel (if you use tape).  Large overnight bag.  Smaller bag (leg bag). Wearing your catheter Attach your catheter to your leg with tape or a leg strap.  Make sure the catheter is not pulled tight.  If a leg strap gets wet, take it off and put on a dry strap.  If you use tape to hold the bag on your leg: 1. Use an alcohol wipe or soap and water to wash your skin where the tape made it sticky before. 2. Use a clean towel to pat-dry that skin. 3. Use new tape to make the bag stay on your leg. Wearing your bags You should have been given a large overnight bag.  You may wear the overnight bag in the day or night.  Always have the overnight bag lower than your bladder.  Do not let the bag touch the floor.  Before you go to sleep, put a clean plastic bag in a wastebasket. Then hang the overnight bag inside the wastebasket. You should also have a smaller leg bag that fits under your clothes.  Always wear the leg bag below your knee.  Do not wear your leg bag at night. How to care for your skin and catheter Supplies needed  A clean washcloth.  Water and mild soap.  A clean towel. Caring for your skin and catheter      Clean the skin around your catheter every day: ? Wash your hands with soap and water. ? Wet a clean washcloth in warm water and mild soap. ? Clean the skin around your urethra. ? If you are male: ? Gently  spread the folds of skin around your vagina (labia). ? With the washcloth in your other hand, wipe the inner side of your labia on each side. Wipe from front to back. ? If you are male: ? Pull back any skin that covers the end of your penis (foreskin). ? With the washcloth in your other hand, wipe your penis in small circles. Start wiping at the tip of your penis, then move away from the catheter. ? With your free hand, hold the catheter close to where it goes into your body. ? Keep holding the catheter during cleaning so it does not get pulled out. ? With the washcloth in your other hand, clean the catheter. ? Only wipe downward on the catheter. ? Do not wipe upward toward your body. Doing this may push germs into your urethra and cause infection. ? Use a clean towel to pat-dry the catheter and the skin around it. Make sure to wipe off all soap. ? Wash your hands with soap and water.  Shower every day. Do not take baths.  Do not use cream, ointment, or lotion on the area where the catheter goes into your body, unless your doctor tells you to.  Do not use powders, sprays, or lotions  on your genital area.  Check your skin around the catheter every day for signs of infection. Check for: ? Redness, swelling, or pain. ? Fluid or blood. ? Warmth. ? Pus or a bad smell. How to empty the bag Supplies needed  Rubbing alcohol.  Gauze pad or cotton ball.  Tape or a leg strap. Emptying the bag Pour the pee out of your bag when it is ?- full, or at least 2-3 times a day. Do this for your overnight bag and your leg bag. 1. Wash your hands with soap and water. 2. Separate (detach) the bag from your leg. 3. Hold the bag over the toilet or a clean pail. Keep the bag lower than your hips and bladder. This is so the pee (urine) does not go back into the tube. 4. Open the pour spout. It is at the bottom of the bag. 5. Empty the pee into the toilet or pail. Do not let the pour spout touch any  surface. 6. Put rubbing alcohol on a gauze pad or cotton ball. 7. Use the gauze pad or cotton ball to clean the pour spout. 8. Close the pour spout. 9. Attach the bag to your leg with tape or a leg strap. 10. Wash your hands with soap and water. Follow instructions for cleaning the drainage bag:  From the product maker.  As told by your doctor. How to change the bag Supplies needed  Alcohol wipes.  A clean bag.  Tape or a leg strap. Changing the bag Replace your bag with a clean bag once a month. If it starts to leak, smell bad, or look dirty, change it sooner. 1. Wash your hands with soap and water. 2. Separate the dirty bag from your leg. 3. Pinch the catheter with your fingers so that pee does not spill out. 4. Separate the catheter tube from the bag tube where these tubes connect (at the connection valve). Do not let the tubes touch any surface. 5. Clean the end of the catheter tube with an alcohol wipe. Use a different alcohol wipe to clean the end of the bag tube. 6. Connect the catheter tube to the tube of the clean bag. 7. Attach the clean bag to your leg with tape or a leg strap. Do not make the bag tight on your leg. 8. Wash your hands with soap and water. General rules   Never pull on your catheter. Never try to take it out. Doing that can hurt you.  Always wash your hands before and after you touch your catheter or bag. Use a mild, fragrance-free soap. If you do not have soap and water, use hand sanitizer.  Always make sure there are no twists or bends (kinks) in the catheter tube.  Always make sure there are no leaks in the catheter or bag.  Drink enough fluid to keep your pee pale yellow.  Do not take baths, swim, or use a hot tub.  If you are male, wipe from front to back after you poop (have a bowel movement). Contact a doctor if:  Your pee is cloudy.  Your pee smells worse than usual.  Your catheter gets clogged.  Your catheter leaks.  Your  bladder feels full. Get help right away if:  You have redness, swelling, or pain where the catheter goes into your body.  You have fluid, blood, pus, or a bad smell coming from the area where the catheter goes into your body.  Your skin feels warm  where the catheter goes into your body. °· You have a fever. °· You have pain in your: °? Belly (abdomen). °? Legs. °? Lower back. °? Bladder. °· You see blood in the catheter. °· Your pee is pink or red. °· You feel sick to your stomach (nauseous). °· You throw up (vomit). °· You have chills. °· Your pee is not draining into the bag. °· Your catheter gets pulled out. °Summary °· An indwelling urinary catheter is a thin tube that is placed into the bladder to help drain pee (urine) out of the body. °· The catheter is placed into the part of the body that drains pee from the bladder (urethra). °· Taking good care of your catheter will keep it working properly and help prevent problems. °· Always wash your hands before and after touching your catheter or bag. °· Never pull on your catheter or try to take it out. °This information is not intended to replace advice given to you by your health care provider. Make sure you discuss any questions you have with your health care provider. °Document Released: 04/12/2013 Document Revised: 06/08/2018 Document Reviewed: 08/01/2017 °Elsevier Interactive Patient Education © 2019 [employer] ° ° °General Anesthesia, Adult, Care After °This sheet gives you information about how to care for yourself after your procedure. Your health care provider may also give you more specific instructions. If you have problems or questions, contact your health care provider. °What can I expect after the procedure? °After the procedure, the following side effects are common: °· Pain or discomfort at the IV site. °· Nausea. °· Vomiting. °· Sore throat. °· Trouble concentrating. °· Feeling cold or chills. °· Weak or tired. °· Sleepiness and  fatigue. °· Soreness and body aches. These side effects can affect parts of the body that were not involved in surgery. °Follow these instructions at home: ° °For at least 24 hours after the procedure: °· Have a responsible adult stay with you. It is important to have someone help care for you until you are awake and alert. °· Rest as needed. °· Do not: °? Participate in activities in which you could fall or become injured. °? Drive. °? Use heavy machinery. °? Drink alcohol. °? Take sleeping pills or medicines that cause drowsiness. °? Make important decisions or sign legal documents. °? Take care of children on your own. °Eating and drinking °· Follow any instructions from your health care provider about eating or drinking restrictions. °· When you feel hungry, start by eating small amounts of foods that are soft and easy to digest (bland), such as toast. Gradually return to your regular diet. °· Drink enough fluid to keep your urine pale yellow. °· If you vomit, rehydrate by drinking water, juice, or clear broth. °General instructions °· If you have sleep apnea, surgery and certain medicines can increase your risk for breathing problems. Follow instructions from your health care provider about wearing your sleep device: °? Anytime you are sleeping, including during daytime naps. °? While taking prescription pain medicines, sleeping medicines, or medicines that make you drowsy. °· Return to your normal activities as told by your health care provider. Ask your health care provider what activities are safe for you. °· Take over-the-counter and prescription medicines only as told by your health care provider. °· If you smoke, do not smoke without supervision. °· Keep all follow-up visits as told by your health care provider. This is important. °Contact a health care provider if: °· You have nausea   or vomiting that does not get better with medicine. °· You cannot eat or drink without vomiting. °· You have pain that  does not get better with medicine. °· You are unable to pass urine. °· You develop a skin rash. °· You have a fever. °· You have redness around your IV site that gets worse. °Get help right away if: °· You have difficulty breathing. °· You have chest pain. °· You have blood in your urine or stool, or you vomit blood. °Summary °· After the procedure, it is common to have a sore throat or nausea. It is also common to feel tired. °· Have a responsible adult stay with you for the first 24 hours after general anesthesia. It is important to have someone help care for you until you are awake and alert. °· When you feel hungry, start by eating small amounts of foods that are soft and easy to digest (bland), such as toast. Gradually return to your regular diet. °· Drink enough fluid to keep your urine pale yellow. °· Return to your normal activities as told by your health care provider. Ask your health care provider what activities are safe for you. °This information is not intended to replace advice given to you by your health care provider. Make sure you discuss any questions you have with your health care provider. °Document Released: 03/24/2001 Document Revised: 08/01/2017 Document Reviewed: 08/01/2017 °Elsevier Interactive Patient Education © 2019 [employer] ° ° °

## 2019-03-18 ENCOUNTER — Encounter (HOSPITAL_COMMUNITY): Payer: Self-pay | Admitting: Urology

## 2019-03-19 ENCOUNTER — Other Ambulatory Visit: Payer: Self-pay

## 2019-03-19 ENCOUNTER — Ambulatory Visit (INDEPENDENT_AMBULATORY_CARE_PROVIDER_SITE_OTHER): Payer: Medicare Other | Admitting: Urology

## 2019-03-19 DIAGNOSIS — N201 Calculus of ureter: Secondary | ICD-10-CM

## 2019-03-21 ENCOUNTER — Other Ambulatory Visit: Payer: Self-pay | Admitting: Rheumatology

## 2019-03-22 ENCOUNTER — Other Ambulatory Visit: Payer: Self-pay

## 2019-03-22 ENCOUNTER — Ambulatory Visit (HOSPITAL_COMMUNITY)
Admission: RE | Admit: 2019-03-22 | Discharge: 2019-03-22 | Disposition: A | Discharge status: Home / Self Care | Payer: Medicare Other | Source: Ambulatory Visit | Attending: Rheumatology | Admitting: Rheumatology

## 2019-03-22 ENCOUNTER — Telehealth: Payer: Self-pay | Admitting: Rheumatology

## 2019-03-22 DIAGNOSIS — M0579 Rheumatoid arthritis with rheumatoid factor of multiple sites without organ or systems involvement: Secondary | ICD-10-CM | POA: Insufficient documentation

## 2019-03-22 MED ORDER — TOCILIZUMAB 400 MG/20ML IV SOLN
4.0000 mg/kg | INTRAVENOUS | Status: DC
  Administered 2019-03-22: 400 mg via INTRAVENOUS
  Filled 2019-03-22: qty 20

## 2019-03-22 MED ORDER — ACETAMINOPHEN 325 MG PO TABS: 650.0000 mg | ORAL_TABLET | ORAL | Status: DC

## 2019-03-22 MED ORDER — DIPHENHYDRAMINE HCL 25 MG PO CAPS: 25.0000 mg | ORAL_CAPSULE | ORAL | Status: DC

## 2019-03-22 NOTE — Telephone Encounter (Signed)
Called Dr. Noland Fordyce office to verify clearance for infusion, the nurse verified with Dr. Alyson Ingles that the infusion is okay to receive. Dr. Estanislado Pandy is okay to proceed with infusion as well. Otila Kluver (Dr. Noland Fordyce nurse) will be faxing a letter to file in patient's chart.  I called [facility] and notified [facility] that patient can receive infusion. Patient is aware and verbalized understanding.

## 2019-03-22 NOTE — Telephone Encounter (Signed)
Last Visit: 02/03/2019 Next Visit: 07/05/2019 Labs: 02/22/2019 CBC WNL. Glucose is 112. Rest of CMP WNL.  Okay to refill per Dr. Estanislado Pandy.

## 2019-03-22 NOTE — Telephone Encounter (Signed)
Patient called stating he is scheduled to have his infusion today at 11:00 am.  Patient states he had a bladder stone removed by Dr. Alyson Ingles who told him it should be fine.  Patient states [facility] infusion needs a call from Dr. Estanislado Pandy to let them know it is okay in order to keep his appointment today.  Patient states he is sorry for the short notice, but just received a call this morning.

## 2019-03-25 NOTE — Progress Notes (Signed)
Patient was present only to fit a Zio monitor and not for a true office visit. This was created under my name however I did not see the patient. The Zio monitor was ordered by me during 3/2 visit.

## 2019-04-19 DIAGNOSIS — N3941 Urge incontinence: Secondary | ICD-10-CM | POA: Diagnosis not present

## 2019-04-21 ENCOUNTER — Ambulatory Visit (INDEPENDENT_AMBULATORY_CARE_PROVIDER_SITE_OTHER): Payer: Medicare Other | Admitting: Urology

## 2019-04-21 DIAGNOSIS — N3941 Urge incontinence: Secondary | ICD-10-CM | POA: Diagnosis not present

## 2019-04-26 ENCOUNTER — Encounter: Payer: Self-pay | Admitting: Primary Care

## 2019-04-26 ENCOUNTER — Other Ambulatory Visit: Payer: Self-pay

## 2019-04-26 ENCOUNTER — Ambulatory Visit (INDEPENDENT_AMBULATORY_CARE_PROVIDER_SITE_OTHER): Payer: Medicare Other | Admitting: Primary Care

## 2019-04-26 ENCOUNTER — Ambulatory Visit (HOSPITAL_COMMUNITY)
Admission: RE | Admit: 2019-04-26 | Discharge: 2019-04-26 | Disposition: A | Discharge status: Home / Self Care | Payer: Medicare Other | Source: Ambulatory Visit | Attending: Rheumatology | Admitting: Rheumatology

## 2019-04-26 ENCOUNTER — Ambulatory Visit: Payer: Medicare Other

## 2019-04-26 DIAGNOSIS — R490 Dysphonia: Secondary | ICD-10-CM | POA: Insufficient documentation

## 2019-04-26 DIAGNOSIS — M0579 Rheumatoid arthritis with rheumatoid factor of multiple sites without organ or systems involvement: Secondary | ICD-10-CM | POA: Diagnosis not present

## 2019-04-26 DIAGNOSIS — G4733 Obstructive sleep apnea (adult) (pediatric): Secondary | ICD-10-CM

## 2019-04-26 LAB — CBC
HCT: 44.2 % (ref 39.0–52.0)
Hemoglobin: 14.7 g/dL (ref 13.0–17.0)
MCH: 30.4 pg (ref 26.0–34.0)
MCHC: 33.3 g/dL (ref 30.0–36.0)
MCV: 91.5 fL (ref 80.0–100.0)
Platelets: 173 10*3/uL (ref 150–400)
RBC: 4.83 MIL/uL (ref 4.22–5.81)
RDW: 13.4 % (ref 11.5–15.5)
WBC: 8.1 10*3/uL (ref 4.0–10.5)
nRBC: 0 % (ref 0.0–0.2)

## 2019-04-26 LAB — COMPREHENSIVE METABOLIC PANEL
ALT: 22 U/L (ref 0–44)
AST: 27 U/L (ref 15–41)
Albumin: 3.6 g/dL (ref 3.5–5.0)
Alkaline Phosphatase: 62 U/L (ref 38–126)
Anion gap: 10 (ref 5–15)
BUN: 10 mg/dL (ref 8–23)
CO2: 29 mmol/L (ref 22–32)
Calcium: 8.9 mg/dL (ref 8.9–10.3)
Chloride: 100 mmol/L (ref 98–111)
Creatinine, Ser: 1.21 mg/dL (ref 0.61–1.24)
GFR calc Af Amer: >60 mL/min (ref >60)
GFR calc non Af Amer: >60 mL/min (ref >60)
Glucose, Bld: 114 mg/dL — ABNORMAL HIGH (ref 70–99)
Potassium: 3.8 mmol/L (ref 3.5–5.1)
Sodium: 139 mmol/L (ref 135–145)
Total Bilirubin: 1.2 mg/dL (ref 0.3–1.2)
Total Protein: 6.7 g/dL (ref 6.5–8.1)

## 2019-04-26 MED ORDER — ACETAMINOPHEN 325 MG PO TABS: 650.0000 mg | ORAL_TABLET | ORAL | Status: DC

## 2019-04-26 MED ORDER — DIPHENHYDRAMINE HCL 25 MG PO CAPS: 25.0000 mg | ORAL_CAPSULE | ORAL | Status: DC

## 2019-04-26 MED ORDER — TOCILIZUMAB 400 MG/20ML IV SOLN
4.0000 mg/kg | INTRAVENOUS | Status: DC
  Administered 2019-04-26: 400 mg via INTRAVENOUS
  Filled 2019-04-26: qty 20

## 2019-04-26 NOTE — Assessment & Plan Note (Signed)
-   Picked up HST today - Will call with results once completed  - He has had issues with mask fit in the past, may benefit from mask desensitization study in the future if needed

## 2019-04-26 NOTE — Assessment & Plan Note (Addendum)
-   Wheezing on exam - Recommend prednisone 20mg  x 5 days; 10mg  x 5 days; stop - Trial daily antihistamine  - Continue Breo 100 and Incruse daily as prescribed (consider Breo 200 if wheezing persists)

## 2019-04-26 NOTE — Assessment & Plan Note (Addendum)
-   Appears chronic  - Recommend follow up with ENT - Not able to adequately evaluate the flow volume loop due to excessive variablity such as might be produced by coughing  - Other thing to consider, DPI could be contributing to voice hoarseness/dry cough. May need to change if ENT eval normal.

## 2019-04-26 NOTE — Progress Notes (Signed)
@Patient  ID: Corlis Hove, male    DOB: 05/08/1951, 68 y.o.   MRN: [id number]  Chief Complaint  Patient presents with   Follow-up    feels like throat restricted, little SOB with occasional wheezing with exertion    Referring provider: [name], MD  HPI: 68 year old male, former smoker quit in [place] (60 pack year hx). PMH significant for moderate COPD, OSA (no on cpap), GERD, CHF, DVT, seizure disorder, RA, prostate cancer. Patient of Dr. Chase Caller, last seen on 12/02/18. CT chest in December showed emphysema, mild bronchiectasis and peripheral subpleural reticulation within right lung base which appears slightly progressive. Ordered for PFTs with [facility]. Maintained on Breo and Incruse. Completed pulmonary rehab in early 2019. On Actemra for RA.  02/09/2019 Patient presents today for 2 month follow-up with PFTs. Some days he feels short of breath on exertion and when lying down.  No significant URI symptoms accompanying DOE. Slight cough with associated wheezing and chest tightness. Taking Breo 100 and Incruse as prescribed. Takes 80mg  lasix daily at night. Hx untreated sleep apnea. Unable to get a good seal on mask. Patient is willing to re-visit. Asking if he can exercise, joined [employer] recently with his wife. Has rescue inhaler available prn.   04/26/2019 Patient presents today for follow-up visit. Saw Dr. Elsworth Soho in March and needs home sleep test. He is doing well, received Actemra infusion today. Pre-medicated with benadryl. Here today to pick up HST. He has tried cpap in the past and had issues with mask leaking. Reports occasional dry cough and wheezing when lying down. Reports some chronic voice hoarseness and throat can feel restricted some, thinks it could be from allergies. He has seen Dr. Redmond Baseman with ENT in the past and plans to follow-up with their office. Continues Breo and Incruse. Pulmonary function test in February showed stable lung volumes compared to 2018. Not able  to adequately evaluate the flow volume loop due to excessive variablity such as might be produced by coughing.    Testing: 02/08/2019 PFTs - FVC 2.24 (51%), FEV1 1.90 (59%), ratio 85, DLCOunc 71 11/26/2017 PFTs - FVC 2.11 (48%), FEV1 1.77 (54%), ratio 84, DLCOcor 63  12/04/18 CT Chest w/o contrast- Diffuse bronchial wall thickening with emphysema, mild bronchiectasis and peripheral subpleural reticulation within right lung base which appears slightly progressive  Allergies  Allergen Reactions   Orencia [Abatacept] Anaphylaxis    Had prostate cancer   Carbamazepine Rash    REACTION: makes drowsy BRAND NAME IS TEGRETOL   Celecoxib Itching and Rash    BRAND NAME IS CELEBREX   Cephalexin Rash    "Severe Rash".  BRAND NAME IS KEFLEX.    Enbrel [Etanercept] Rash   Humira [Adalimumab] Rash   Levofloxacin Other (See Comments)    REACTION: GI Intolerance BRAND NAME IS LEVAQUIN   Sulfa Antibiotics Rash   Sulfasalazine Rash    Immunization History  Administered Date(s) Administered   Influenza, High Dose Seasonal PF 10/05/2018   Influenza,inj,Quad PF,6+ Mos 08/31/2015   Influenza-Unspecified 09/30/2016, 08/22/2017   Pneumococcal Conjugate-13 10/05/2018   Pneumococcal-Unspecified 12/30/2012    Past Medical History:  Diagnosis Date   Anxiety    hx of    Arthritis    RA   Asthma    Clotting disorder (HCC)    DVT both legs    COPD (chronic obstructive pulmonary disease) (HCC)    DDD (degenerative disc disease), cervical    with UE's paresthesias   Diverticulosis  DVT, lower extremity ([place])    bilat   Eye abnormality    right eye drifts has difficulty focusing 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 Unknown origin.   Shortness of breath dyspnea    exertion    Sleep apnea    not on cpap   Tubular adenoma of colon 04/2008    Tobacco History: Social History   Tobacco Use  Smoking Status Former Smoker   Packs/day: 3.00   Years: 20.00   Pack years: 60.00   Types: Cigarettes   Last attempt to quit: 12/30/1996   Years since quitting: 22.3  Smokeless Tobacco Never Used   Counseling given: Not Answered   Outpatient Medications Prior to Visit  Medication Sig Dispense Refill   acetaminophen (TYLENOL) 500 MG tablet Take 1,000 mg by mouth every 6 (six) hours as needed (arthritis pain).     albuterol (PROVENTIL) (2.5 MG/3ML) 0.083% nebulizer solution Take 3 mLs (2.5 mg total) by nebulization every 6 (six) hours as needed for wheezing or shortness of breath. 75 mL 12   albuterol (PROVENTIL) 2 MG tablet Take 2 mg by mouth daily.      alendronate (FOSAMAX) 70 MG tablet TAKE 1 TABLET BY MOUTH ONCE A WEEK. TAKE WITH FULL GLASS OF WATER ON EMPTY STOMACH 12 tablet 0   allopurinol (ZYLOPRIM) 100 MG tablet TAKE 1 TABLET BY MOUTH TWICE A DAY 180 tablet 0   BREO ELLIPTA 100-25 MCG/INH AEPB INHALE 1 PUFF BY MOUTH EVERY DAY (Patient taking differently: Inhale 1 puff into the lungs at bedtime. ) 60 each 0   Calcium-Phosphorus-Vitamin D (CITRACAL +D3 PO) Take 2 tablets by mouth 2 (two) times daily.      diclofenac sodium (VOLTAREN) 1 % GEL Apply 3 grams to 3 large joints up to 3 times daily (Patient taking differently: Apply 3 g topically 3 (three) times daily as needed (pain). Apply 3 grams to 3 large joints up to 3 times daily) 3 Tube 3   dicyclomine (BENTYL) 10 MG capsule TAKE 1 CAPSULE BY MOUTH 4 TIMES DAILY BEFORE MEALS AND AT BEDTIME (Patient taking differently: Take 20 mg by mouth 2 (two) times daily. ) 527 capsule 0   folic acid (FOLVITE) 1 MG tablet TAKE 2 TABLETS BY MOUTH EVERY DAY IN THE  MORNING (Patient taking differently: Take 2 mg by mouth daily. ) 180 tablet 4   furosemide (LASIX) 40 MG tablet Take 1 tablet (40 mg total) by mouth 2 (two) times daily. (Patient taking differently: Take 80 mg by mouth at bedtime. ) 60 tablet 11   INCRUSE ELLIPTA 62.5 MCG/INH AEPB TAKE 1 PUFF BY MOUTH EVERY DAY (Patient taking differently: Inhale 1 puff into the lungs every morning. ) 30 each 2   leflunomide (ARAVA) 20 MG tablet TAKE 1 TABLET BY MOUTH EVERY DAY 90 tablet 0   Omega-3 Fatty Acids (FISH OIL) 1000 MG CPDR Take 1,000 mg by mouth daily.      omeprazole (PRILOSEC) 40 MG capsule Take 40 mg by mouth  daily.   2   phenytoin (DILANTIN) 100 MG ER capsule Take 100-200 mg by mouth See admin instructions. Take one capsule in the morning and 2 capsules at bedtime     Potassium Chloride ER 20 MEQ TBCR Take 20 mEq by mouth daily.      Tocilizumab (ACTEMRA IV) Inject into the vein every 28 (twenty-eight) days. For Rheumatoid Arthritis     traMADol (ULTRAM) 50 MG tablet Take 1 tablet (50 mg total) by mouth every 6 (six) hours as needed. 15 tablet 0   warfarin (COUMADIN) 3 MG tablet Take 3 mg by mouth at bedtime.      Facility-Administered Medications Prior to Visit  Medication Dose Route Frequency Provider Last Rate Last Dose   acetaminophen (TYLENOL) tablet 650 mg  650 mg Oral 60 min Pre-Op Deveshwar, Abel Presto, MD       diphenhydrAMINE (BENADRYL) capsule 25 mg  25 mg Oral 60 min Pre-Op Deveshwar, Abel Presto, MD       tocilizumab (ACTEMRA) 4 mg/kg = 400 mg in sodium chloride 0.9 % 100 mL infusion  4 mg/kg Intravenous Q28 days Bo Merino, MD   Stopped at 04/26/19 1239   Review of Systems  Review of Systems  HENT: Positive for sneezing. Negative for congestion and postnasal drip.   Eyes: Negative.   Respiratory: Positive for cough and wheezing. Negative for choking, chest tightness, shortness of breath and stridor.   Cardiovascular: Negative.    Physical Exam  BP 136/80 (BP  Location: Right Arm, Cuff Size: Normal)    Pulse 76    Temp 97.7 F (36.5 C)    Ht 5\' 8"  (1.727 m)    Wt 229 lb (103.9 kg)    SpO2 95%    BMI 34.82 kg/m  Physical Exam Constitutional:      Appearance: Normal appearance.  HENT:     Right Ear: Tympanic membrane normal.     Left Ear: Tympanic membrane normal.     Mouth/Throat:     Mouth: Mucous membranes are moist.     Pharynx: Oropharynx is clear.  Eyes:     Extraocular Movements: Extraocular movements intact.     Pupils: Pupils are equal, round, and reactive to light.  Neck:     Musculoskeletal: Normal range of motion and neck supple.  Cardiovascular:     Rate and Rhythm: Normal rate and regular rhythm.  Pulmonary:     Effort: No respiratory distress.     Breath sounds: No stridor. Wheezing present. No rhonchi.     Comments: Exp wheeze left upper lobe Musculoskeletal: Normal range of motion.  Skin:    General: Skin is warm and dry.  Neurological:     General: No focal deficit present.     Mental Status: He is alert. Mental status is at baseline.  Psychiatric:        Mood and Affect: Mood normal.        Behavior: Behavior normal.        Thought Content: Thought content normal.        Judgment: Judgment normal.      Lab Results:  CBC    Component Value Date/Time   WBC 8.1 04/26/2019 1104   RBC 4.83 04/26/2019 1104   HGB 14.7 04/26/2019 1104   HGB 14.7 11/25/2016 0824   HCT 44.2 04/26/2019 1104   HCT 43.4 11/25/2016 0824   PLT 173 04/26/2019 1104   PLT 213 11/25/2016 0824   MCV 91.5 04/26/2019 1104   MCV 87  11/25/2016 0824   MCH 30.4 04/26/2019 1104   MCHC 33.3 04/26/2019 1104   RDW 13.4 04/26/2019 1104   RDW 16.0 (H) 11/25/2016 0824   LYMPHSABS 0.8 11/29/2018 1102   LYMPHSABS 0.9 11/25/2016 0824   MONOABS 0.8 11/29/2018 1102   EOSABS 0.4 11/29/2018 1102   EOSABS 0.4 11/25/2016 0824   BASOSABS 0.1 11/29/2018 1102   BASOSABS 0.1 11/25/2016 0824    BMET    Component Value Date/Time   NA 139 04/26/2019  1104   NA 144 11/25/2016 0824   K 3.8 04/26/2019 1104   CL 100 04/26/2019 1104   CO2 29 04/26/2019 1104   GLUCOSE 114 (H) 04/26/2019 1104   BUN 10 04/26/2019 1104   BUN 13 11/25/2016 0824   CREATININE 1.21 04/26/2019 1104   CREATININE 1.24 03/18/2018 1107   CALCIUM 8.9 04/26/2019 1104   GFRNONAA >60 04/26/2019 1104   GFRNONAA 60 03/18/2018 1107   GFRAA >60 04/26/2019 1104   GFRAA 69 03/18/2018 1107    BNP    Component Value Date/Time   BNP 32.0 07/05/2017 1711    ProBNP    Component Value Date/Time   PROBNP 26.0 02/08/2019 1437    Imaging: No results found.   Assessment & Plan:   OSA (obstructive sleep apnea) - Picked up HST today - Will call with results once completed  - He has had issues with mask fit in the past, may benefit from mask desensitization study in the future if needed   Chronic obstructive pulmonary disease (HCC) - Wheezing on exam - Recommend prednisone 20mg  x 5 days; 10mg  x 5 days; stop - Trial daily antihistamine  - Continue Breo 100 and Incruse daily as prescribed (consider Breo 200 if wheezing persists)  Voice hoarseness - Appears chronic  - Recommend follow up with ENT - Not able to adequately evaluate the flow volume loop due to excessive variablity such as might be produced by coughing  - Other thing to consider, DPI could be contributing to voice hoarseness/dry cough. May need to change if ENT eval normal.   Martyn Ehrich, NP 04/26/2019

## 2019-04-26 NOTE — Patient Instructions (Addendum)
Try over the counter claritin or Zyrtec daily   Take 20mg  prednisone x 5 days; and if needed then taper to 10mg  x 5 days; then stop   Home sleep test today  Follow up with ENT   Follow up with Dr. Chase Caller in 4 months

## 2019-04-27 ENCOUNTER — Telehealth: Payer: Self-pay | Admitting: Pulmonary Disease

## 2019-04-27 DIAGNOSIS — G4733 Obstructive sleep apnea (adult) (pediatric): Secondary | ICD-10-CM

## 2019-04-27 NOTE — Telephone Encounter (Signed)
Severe OSA 38/h with severe O2 drops Ideally , he needs CPAP titration - pl schedule when able Since this is likely to be delayed, proceed with autoCPAP 5-15 cm, full face mask of choice OV in 4-6 wks (televisit ok)

## 2019-04-27 NOTE — Progress Notes (Signed)
WNLs. Glu - most likely non-fasting.

## 2019-04-27 NOTE — Telephone Encounter (Signed)
Call made to patient, made aware of results per RA. Made aware due to COVID-19 we are not able to do the Cpap titration study however we will go ahead and set him up with a cpap and do the cpap titration once available. Voiced understanding. Orders placed. Nothing further is needed at this time.

## 2019-04-30 ENCOUNTER — Other Ambulatory Visit: Payer: Self-pay | Admitting: Physician Assistant

## 2019-05-12 ENCOUNTER — Ambulatory Visit: Payer: Medicare Other | Admitting: Internal Medicine

## 2019-05-21 ENCOUNTER — Other Ambulatory Visit: Payer: Self-pay | Admitting: *Deleted

## 2019-05-21 NOTE — Progress Notes (Signed)
Infusion orders are current for patient CBC CMP Tylenol Benadryl appointments are up to date and follow up appointment  is scheduled TB gold not due yet.  

## 2019-05-25 ENCOUNTER — Other Ambulatory Visit: Payer: Self-pay

## 2019-05-25 ENCOUNTER — Ambulatory Visit (HOSPITAL_COMMUNITY)
Admission: RE | Admit: 2019-05-25 | Discharge: 2019-05-25 | Disposition: A | Discharge status: Home / Self Care | Payer: Medicare Other | Source: Ambulatory Visit | Attending: Rheumatology | Admitting: Rheumatology

## 2019-05-25 DIAGNOSIS — N3941 Urge incontinence: Secondary | ICD-10-CM | POA: Diagnosis not present

## 2019-05-25 DIAGNOSIS — M0579 Rheumatoid arthritis with rheumatoid factor of multiple sites without organ or systems involvement: Secondary | ICD-10-CM | POA: Insufficient documentation

## 2019-05-25 MED ORDER — DIPHENHYDRAMINE HCL 25 MG PO CAPS: 25.0000 mg | ORAL_CAPSULE | ORAL | Status: DC

## 2019-05-25 MED ORDER — ACETAMINOPHEN 325 MG PO TABS: 650.0000 mg | ORAL_TABLET | ORAL | Status: DC

## 2019-05-25 MED ORDER — TOCILIZUMAB 400 MG/20ML IV SOLN
4.0000 mg/kg | INTRAVENOUS | Status: DC
  Administered 2019-05-25: 402 mg via INTRAVENOUS
  Filled 2019-05-25: qty 20.1

## 2019-05-29 ENCOUNTER — Other Ambulatory Visit: Payer: Self-pay | Admitting: Rheumatology

## 2019-05-31 NOTE — Telephone Encounter (Signed)
Last Visit: 02/03/2019 Next Visit: 07/05/2019 Labs: 04/26/19 WNLs. Glu - most likely non-fasting.  Okay to refill per Dr. Estanislado Pandy.

## 2019-06-02 ENCOUNTER — Other Ambulatory Visit (HOSPITAL_COMMUNITY)
Admission: RE | Admit: 2019-06-02 | Discharge: 2019-06-02 | Disposition: A | Discharge status: Home / Self Care | Payer: Medicare Other | Source: Ambulatory Visit | Attending: Pulmonary Disease | Admitting: Pulmonary Disease

## 2019-06-02 ENCOUNTER — Other Ambulatory Visit: Payer: Self-pay

## 2019-06-02 DIAGNOSIS — Z1159 Encounter for screening for other viral diseases: Secondary | ICD-10-CM | POA: Diagnosis not present

## 2019-06-02 LAB — SARS CORONAVIRUS 2 BY RT PCR (HOSPITAL ORDER, PERFORMED IN ~~LOC~~ HOSPITAL LAB): SARS Coronavirus 2: NEGATIVE

## 2019-06-03 ENCOUNTER — Ambulatory Visit: Payer: Medicare Other | Attending: Pulmonary Disease | Admitting: Pulmonary Disease

## 2019-06-03 ENCOUNTER — Other Ambulatory Visit: Payer: Self-pay

## 2019-06-03 DIAGNOSIS — G4731 Primary central sleep apnea: Secondary | ICD-10-CM | POA: Diagnosis not present

## 2019-06-03 DIAGNOSIS — G4733 Obstructive sleep apnea (adult) (pediatric): Secondary | ICD-10-CM | POA: Insufficient documentation

## 2019-06-07 ENCOUNTER — Other Ambulatory Visit: Payer: Self-pay

## 2019-06-07 ENCOUNTER — Emergency Department (HOSPITAL_COMMUNITY): Payer: Medicare Other

## 2019-06-07 ENCOUNTER — Inpatient Hospital Stay (HOSPITAL_COMMUNITY)
Admission: EM | Admit: 2019-06-07 | Discharge: 2019-06-09 | DRG: 872 | Disposition: A | Discharge status: Home / Self Care | Payer: Medicare Other | Attending: Internal Medicine | Admitting: Internal Medicine

## 2019-06-07 ENCOUNTER — Encounter (HOSPITAL_COMMUNITY): Payer: Self-pay | Admitting: Emergency Medicine

## 2019-06-07 DIAGNOSIS — R7881 Bacteremia: Secondary | ICD-10-CM | POA: Diagnosis not present

## 2019-06-07 DIAGNOSIS — G4733 Obstructive sleep apnea (adult) (pediatric): Secondary | ICD-10-CM | POA: Diagnosis present

## 2019-06-07 DIAGNOSIS — R000 Tachycardia, unspecified: Secondary | ICD-10-CM | POA: Diagnosis not present

## 2019-06-07 DIAGNOSIS — A419 Sepsis, unspecified organism: Secondary | ICD-10-CM | POA: Diagnosis present

## 2019-06-07 DIAGNOSIS — Z79899 Other long term (current) drug therapy: Secondary | ICD-10-CM | POA: Diagnosis not present

## 2019-06-07 DIAGNOSIS — Z6835 Body mass index (BMI) 35.0-35.9, adult: Secondary | ICD-10-CM

## 2019-06-07 DIAGNOSIS — R0602 Shortness of breath: Secondary | ICD-10-CM | POA: Diagnosis not present

## 2019-06-07 DIAGNOSIS — Z86718 Personal history of other venous thrombosis and embolism: Secondary | ICD-10-CM | POA: Diagnosis not present

## 2019-06-07 DIAGNOSIS — E785 Hyperlipidemia, unspecified: Secondary | ICD-10-CM | POA: Diagnosis present

## 2019-06-07 DIAGNOSIS — G40909 Epilepsy, unspecified, not intractable, without status epilepticus: Secondary | ICD-10-CM | POA: Diagnosis present

## 2019-06-07 DIAGNOSIS — Z1159 Encounter for screening for other viral diseases: Secondary | ICD-10-CM

## 2019-06-07 DIAGNOSIS — J441 Chronic obstructive pulmonary disease with (acute) exacerbation: Secondary | ICD-10-CM | POA: Diagnosis present

## 2019-06-07 DIAGNOSIS — M1A09X0 Idiopathic chronic gout, multiple sites, without tophus (tophi): Secondary | ICD-10-CM | POA: Diagnosis not present

## 2019-06-07 DIAGNOSIS — E7800 Pure hypercholesterolemia, unspecified: Secondary | ICD-10-CM | POA: Diagnosis present

## 2019-06-07 DIAGNOSIS — R5383 Other fatigue: Secondary | ICD-10-CM | POA: Diagnosis not present

## 2019-06-07 DIAGNOSIS — N182 Chronic kidney disease, stage 2 (mild): Secondary | ICD-10-CM | POA: Diagnosis present

## 2019-06-07 DIAGNOSIS — Z87891 Personal history of nicotine dependence: Secondary | ICD-10-CM | POA: Diagnosis not present

## 2019-06-07 DIAGNOSIS — Z8546 Personal history of malignant neoplasm of prostate: Secondary | ICD-10-CM | POA: Diagnosis not present

## 2019-06-07 DIAGNOSIS — A4153 Sepsis due to Serratia: Principal | ICD-10-CM

## 2019-06-07 DIAGNOSIS — L03115 Cellulitis of right lower limb: Secondary | ICD-10-CM | POA: Diagnosis present

## 2019-06-07 DIAGNOSIS — J449 Chronic obstructive pulmonary disease, unspecified: Secondary | ICD-10-CM | POA: Diagnosis present

## 2019-06-07 DIAGNOSIS — L0390 Cellulitis, unspecified: Secondary | ICD-10-CM

## 2019-06-07 DIAGNOSIS — R609 Edema, unspecified: Secondary | ICD-10-CM | POA: Diagnosis not present

## 2019-06-07 DIAGNOSIS — Z7901 Long term (current) use of anticoagulants: Secondary | ICD-10-CM

## 2019-06-07 DIAGNOSIS — K219 Gastro-esophageal reflux disease without esophagitis: Secondary | ICD-10-CM | POA: Diagnosis present

## 2019-06-07 DIAGNOSIS — I5032 Chronic diastolic (congestive) heart failure: Secondary | ICD-10-CM | POA: Diagnosis present

## 2019-06-07 DIAGNOSIS — M79604 Pain in right leg: Secondary | ICD-10-CM | POA: Diagnosis not present

## 2019-06-07 DIAGNOSIS — Z8679 Personal history of other diseases of the circulatory system: Secondary | ICD-10-CM

## 2019-06-07 DIAGNOSIS — M1009 Idiopathic gout, multiple sites: Secondary | ICD-10-CM | POA: Diagnosis present

## 2019-06-07 DIAGNOSIS — M069 Rheumatoid arthritis, unspecified: Secondary | ICD-10-CM | POA: Diagnosis present

## 2019-06-07 DIAGNOSIS — Z791 Long term (current) use of non-steroidal anti-inflammatories (NSAID): Secondary | ICD-10-CM | POA: Diagnosis not present

## 2019-06-07 DIAGNOSIS — R509 Fever, unspecified: Secondary | ICD-10-CM | POA: Diagnosis not present

## 2019-06-07 LAB — COMPREHENSIVE METABOLIC PANEL
ALT: 24 U/L (ref 0–44)
AST: 30 U/L (ref 15–41)
Albumin: 4.2 g/dL (ref 3.5–5.0)
Alkaline Phosphatase: 64 U/L (ref 38–126)
Anion gap: 15 (ref 5–15)
BUN: 12 mg/dL (ref 8–23)
CO2: 25 mmol/L (ref 22–32)
Calcium: 9.5 mg/dL (ref 8.9–10.3)
Chloride: 102 mmol/L (ref 98–111)
Creatinine, Ser: 1.24 mg/dL (ref 0.61–1.24)
GFR calc Af Amer: >60 mL/min (ref >60)
GFR calc non Af Amer: 59 mL/min — ABNORMAL LOW (ref >60)
Glucose, Bld: 120 mg/dL — ABNORMAL HIGH (ref 70–99)
Potassium: 3.9 mmol/L (ref 3.5–5.1)
Sodium: 142 mmol/L (ref 135–145)
Total Bilirubin: 1 mg/dL (ref 0.3–1.2)
Total Protein: 6.6 g/dL (ref 6.5–8.1)

## 2019-06-07 LAB — MAGNESIUM: Magnesium: 1.7 mg/dL (ref 1.7–2.4)

## 2019-06-07 LAB — CBC WITH DIFFERENTIAL/PLATELET
Abs Immature Granulocytes: 0.06 10*3/uL (ref 0.00–0.07)
Basophils Absolute: 0.1 10*3/uL (ref 0.0–0.1)
Basophils Relative: 0 %
Eosinophils Absolute: 1.2 10*3/uL — ABNORMAL HIGH (ref 0.0–0.5)
Eosinophils Relative: 6 %
HCT: 47.4 % (ref 39.0–52.0)
Hemoglobin: 15.2 g/dL (ref 13.0–17.0)
Immature Granulocytes: 0 %
Lymphocytes Relative: 3 %
Lymphs Abs: 0.6 10*3/uL — ABNORMAL LOW (ref 0.7–4.0)
MCH: 30.1 pg (ref 26.0–34.0)
MCHC: 32.1 g/dL (ref 30.0–36.0)
MCV: 93.9 fL (ref 80.0–100.0)
Monocytes Absolute: 0.4 10*3/uL (ref 0.1–1.0)
Monocytes Relative: 2 %
Neutro Abs: 16.3 10*3/uL — ABNORMAL HIGH (ref 1.7–7.7)
Neutrophils Relative %: 89 %
Platelets: 160 10*3/uL (ref 150–400)
RBC: 5.05 MIL/uL (ref 4.22–5.81)
RDW: 14 % (ref 11.5–15.5)
WBC: 18.6 10*3/uL — ABNORMAL HIGH (ref 4.0–10.5)
nRBC: 0 % (ref 0.0–0.2)

## 2019-06-07 LAB — SARS CORONAVIRUS 2 BY RT PCR (HOSPITAL ORDER, PERFORMED IN ~~LOC~~ HOSPITAL LAB): SARS Coronavirus 2: NEGATIVE

## 2019-06-07 LAB — LACTIC ACID, PLASMA
Lactic Acid, Venous: 3 mmol/L (ref 0.5–1.9)
Lactic Acid, Venous: 3.1 mmol/L (ref 0.5–1.9)
Lactic Acid, Venous: 2.2 mmol/L (ref 0.5–1.9)
Lactic Acid, Venous: 2.5 mmol/L (ref 0.5–1.9)

## 2019-06-07 LAB — PROTIME-INR
INR: 2.4 — ABNORMAL HIGH (ref 0.8–1.2)
Prothrombin Time: 25.8 seconds — ABNORMAL HIGH (ref 11.4–15.2)

## 2019-06-07 LAB — BRAIN NATRIURETIC PEPTIDE: B Natriuretic Peptide: 38 pg/mL (ref 0.0–100.0)

## 2019-06-07 LAB — PHOSPHORUS: Phosphorus: 2.4 mg/dL — ABNORMAL LOW (ref 2.5–4.6)

## 2019-06-07 MED ORDER — VANCOMYCIN HCL 10 G IV SOLR
1250.0000 mg | INTRAVENOUS | Status: DC
  Filled 2019-06-07: qty 1250

## 2019-06-07 MED ORDER — FLUTICASONE FUROATE-VILANTEROL 100-25 MCG/INH IN AEPB
1.0000 | INHALATION_SPRAY | Freq: Every day | RESPIRATORY_TRACT | Status: DC
  Administered 2019-06-08: 1 via RESPIRATORY_TRACT
  Filled 2019-06-07: qty 28

## 2019-06-07 MED ORDER — SODIUM CHLORIDE 0.9 % IV BOLUS
1000.0000 mL | Freq: Once | INTRAVENOUS | Status: AC
  Administered 2019-06-07: 1000 mL via INTRAVENOUS

## 2019-06-07 MED ORDER — DICYCLOMINE HCL 10 MG PO CAPS
20.0000 mg | ORAL_CAPSULE | Freq: Two times a day (BID) | ORAL | Status: DC
  Administered 2019-06-07 – 2019-06-09 (×4): 20 mg via ORAL
  Filled 2019-06-07 (×4): qty 2

## 2019-06-07 MED ORDER — MAGNESIUM SULFATE 2 GM/50ML IV SOLN
2.0000 g | Freq: Once | INTRAVENOUS | Status: AC
  Administered 2019-06-07: 2 g via INTRAVENOUS
  Filled 2019-06-07: qty 50

## 2019-06-07 MED ORDER — PHENYTOIN SODIUM EXTENDED 100 MG PO CAPS
100.0000 mg | ORAL_CAPSULE | Freq: Every day | ORAL | Status: DC
  Administered 2019-06-08 – 2019-06-09 (×2): 100 mg via ORAL
  Filled 2019-06-07 (×2): qty 1

## 2019-06-07 MED ORDER — VANCOMYCIN HCL IN DEXTROSE 1-5 GM/200ML-% IV SOLN
1000.0000 mg | INTRAVENOUS | Status: AC
  Administered 2019-06-07 (×2): 1000 mg via INTRAVENOUS
  Filled 2019-06-07 (×2): qty 200

## 2019-06-07 MED ORDER — CIPROFLOXACIN IN D5W 400 MG/200ML IV SOLN
400.0000 mg | Freq: Two times a day (BID) | INTRAVENOUS | Status: DC
  Administered 2019-06-07: 400 mg via INTRAVENOUS
  Filled 2019-06-07: qty 200

## 2019-06-07 MED ORDER — ALLOPURINOL 100 MG PO TABS
100.0000 mg | ORAL_TABLET | Freq: Two times a day (BID) | ORAL | Status: DC
  Administered 2019-06-07 – 2019-06-09 (×4): 100 mg via ORAL
  Filled 2019-06-07 (×4): qty 1

## 2019-06-07 MED ORDER — FESOTERODINE FUMARATE ER 4 MG PO TB24
8.0000 mg | ORAL_TABLET | Freq: Every day | ORAL | Status: DC
  Administered 2019-06-08: 8 mg via ORAL
  Filled 2019-06-07: qty 1
  Filled 2019-06-07 (×2): qty 2
  Filled 2019-06-07: qty 1

## 2019-06-07 MED ORDER — ACETAMINOPHEN 650 MG RE SUPP: 650.0000 mg | Freq: Four times a day (QID) | RECTAL | Status: DC | PRN

## 2019-06-07 MED ORDER — WARFARIN SODIUM 2 MG PO TABS
3.0000 mg | ORAL_TABLET | Freq: Once | ORAL | Status: AC
  Administered 2019-06-07: 3 mg via ORAL
  Filled 2019-06-07: qty 2
  Filled 2019-06-07: qty 1.5

## 2019-06-07 MED ORDER — UMECLIDINIUM BROMIDE 62.5 MCG/INH IN AEPB
1.0000 | INHALATION_SPRAY | Freq: Every morning | RESPIRATORY_TRACT | Status: DC
  Administered 2019-06-08: 1 via RESPIRATORY_TRACT
  Filled 2019-06-07: qty 7

## 2019-06-07 MED ORDER — WARFARIN SODIUM 3 MG PO TABS
3.0000 mg | ORAL_TABLET | Freq: Once | ORAL | Status: DC
  Filled 2019-06-07: qty 1

## 2019-06-07 MED ORDER — ONDANSETRON HCL 4 MG PO TABS: 4.0000 mg | ORAL_TABLET | Freq: Four times a day (QID) | ORAL | Status: DC | PRN

## 2019-06-07 MED ORDER — ACETAMINOPHEN 325 MG PO TABS
650.0000 mg | ORAL_TABLET | Freq: Four times a day (QID) | ORAL | Status: DC | PRN
  Administered 2019-06-08: 650 mg via ORAL
  Filled 2019-06-07: qty 2

## 2019-06-07 MED ORDER — PHENYTOIN SODIUM EXTENDED 100 MG PO CAPS
200.0000 mg | ORAL_CAPSULE | Freq: Every day | ORAL | Status: DC
  Administered 2019-06-07 – 2019-06-08 (×2): 200 mg via ORAL
  Filled 2019-06-07 (×2): qty 2

## 2019-06-07 MED ORDER — SODIUM CHLORIDE 0.9 % IV SOLN
2.0000 g | Freq: Once | INTRAVENOUS | Status: AC
  Administered 2019-06-07: 2 g via INTRAVENOUS
  Filled 2019-06-07: qty 2

## 2019-06-07 MED ORDER — ALBUTEROL SULFATE 2 MG PO TABS
2.0000 mg | ORAL_TABLET | Freq: Every day | ORAL | Status: DC
  Administered 2019-06-08 – 2019-06-09 (×2): 2 mg via ORAL
  Filled 2019-06-07 (×4): qty 1

## 2019-06-07 MED ORDER — METRONIDAZOLE IN NACL 5-0.79 MG/ML-% IV SOLN: 500.0000 mg | Freq: Once | INTRAVENOUS | Status: DC

## 2019-06-07 MED ORDER — POTASSIUM CHLORIDE CRYS ER 20 MEQ PO TBCR: 20.0000 meq | EXTENDED_RELEASE_TABLET | Freq: Every day | ORAL | Status: DC

## 2019-06-07 MED ORDER — POTASSIUM CHLORIDE IN NACL 20-0.9 MEQ/L-% IV SOLN
INTRAVENOUS | Status: AC
  Administered 2019-06-07: 20:00:00 via INTRAVENOUS
  Filled 2019-06-07: qty 1000

## 2019-06-07 MED ORDER — VANCOMYCIN HCL IN DEXTROSE 1-5 GM/200ML-% IV SOLN: 1000.0000 mg | Freq: Once | INTRAVENOUS | Status: DC

## 2019-06-07 MED ORDER — ALBUTEROL SULFATE HFA 108 (90 BASE) MCG/ACT IN AERS
2.0000 | INHALATION_SPRAY | Freq: Once | RESPIRATORY_TRACT | Status: AC
  Administered 2019-06-07: 2 via RESPIRATORY_TRACT
  Filled 2019-06-07: qty 6.7

## 2019-06-07 MED ORDER — PANTOPRAZOLE SODIUM 40 MG PO TBEC
40.0000 mg | DELAYED_RELEASE_TABLET | Freq: Every day | ORAL | Status: DC
  Administered 2019-06-07 – 2019-06-09 (×3): 40 mg via ORAL
  Filled 2019-06-07 (×3): qty 1

## 2019-06-07 MED ORDER — ACETAMINOPHEN 500 MG PO TABS
1000.0000 mg | ORAL_TABLET | Freq: Once | ORAL | Status: AC
  Administered 2019-06-07: 1000 mg via ORAL
  Filled 2019-06-07: qty 2

## 2019-06-07 MED ORDER — WARFARIN - PHARMACIST DOSING INPATIENT
Freq: Every day | Status: DC
  Administered 2019-06-07: 22:00:00

## 2019-06-07 MED ORDER — PHENYTOIN SODIUM EXTENDED 100 MG PO CAPS: 100.0000 mg | ORAL_CAPSULE | ORAL | Status: DC

## 2019-06-07 MED ORDER — K PHOS MONO-SOD PHOS DI & MONO 155-852-130 MG PO TABS
500.0000 mg | ORAL_TABLET | Freq: Two times a day (BID) | ORAL | Status: AC
  Administered 2019-06-07 – 2019-06-08 (×3): 500 mg via ORAL
  Filled 2019-06-07 (×3): qty 2

## 2019-06-07 MED ORDER — ONDANSETRON HCL 4 MG/2ML IJ SOLN: 4.0000 mg | Freq: Four times a day (QID) | INTRAMUSCULAR | Status: DC | PRN

## 2019-06-07 MED ORDER — SODIUM CHLORIDE 0.9% FLUSH: 3.0000 mL | Freq: Once | INTRAVENOUS | Status: DC

## 2019-06-07 NOTE — Progress Notes (Signed)
ANTICOAGULATION CONSULT NOTE - Initial Consult  Pharmacy Consult for Coumadin Indication: DVT  Allergies  Allergen Reactions  . Orencia [Abatacept] Anaphylaxis    Had prostate cancer  . Carbamazepine Rash    REACTION: makes drowsy BRAND NAME IS TEGRETOL  . Celecoxib Itching and Rash    BRAND NAME IS CELEBREX  . Cephalexin Rash    "Severe Rash".  BRAND NAME IS KEFLEX.   . Enbrel [Etanercept] Rash  . Humira [Adalimumab] Rash  . Levofloxacin Other (See Comments)    REACTION: GI Intolerance BRAND NAME IS LEVAQUIN  . Sulfa Antibiotics Rash  . Sulfasalazine Rash    Patient Measurements: Height: 5\' 8"  (172.7 cm) Weight: 242 lb 8.1 oz (110 kg) IBW/kg (Calculated) : 68.4  Vital Signs: Temp: 101.3 F (38.5 C) (06/08 1152) Temp Source: Oral (06/08 1152) BP: 109/69 (06/08 1430) Pulse Rate: 100 (06/08 1430)  Labs: Recent Labs    06/07/19 1216  HGB 15.2  HCT 47.4  PLT 160  LABPROT 25.8*  INR 2.4*  CREATININE 1.24    Estimated Creatinine Clearance: 68.5 mL/min (by C-G formula based on SCr of 1.24 mg/dL).   Medical History: Past Medical History:  Diagnosis Date  . Anxiety    hx of   . Arthritis    RA  . Asthma   . Clotting disorder (HCC)    DVT both legs   . COPD (chronic obstructive pulmonary disease) ([place])   . DDD (degenerative disc disease), cervical    with UE's paresthesias  . Diverticulosis   . DVT, lower extremity ([place])    bilat  . 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 GERD (gastroesophageal reflux disease)   . Gout   . Heart murmur   . History of measles   . History of shingles   . Hypercholesterolemia   . IBS (irritable bowel syndrome)   . IBS (irritable bowel syndrome)   . Peripheral edema   . Pneumonia    hx of   . Prostate cancer ([place])   . Seizures ([place])    last seizure 20 years ago; on dilantin. Unknown origin.  [name] Shortness of breath dyspnea    exertion   . Sleep apnea    not on cpap  . Tubular adenoma of colon 04/2008    Medications:  See med rec  Assessment: 68 yo male presented to ED with fever and swollen Right leg. He is on chronic anticoagulation with Coumadin for previous DVT. INR is therapeutic at 2.4.  Home dose is 3mg  HS  Goal of Therapy:  INR 2-3 Monitor platelets by anticoagulation protocol: Yes   Plan:  Coumadin 3mg  po x 1 today Daily PT-INR Monitor for S/S of bleeding  Isac Sarna, BS Vena [place], BCPS Clinical Pharmacist Pager [phone] 06/07/2019,3:43 PM

## 2019-06-07 NOTE — ED Provider Notes (Signed)
[facility] EMERGENCY DEPARTMENT Provider Note   CSN: [id number] Arrival date & time: 06/07/19  1144    History   Chief Complaint Chief Complaint  Patient presents with   Fever    HPI [name] is a 68 y.o. male asthma, DVT (currently on Coumadin), COPD, GERD who presents for evaluation of fevers and right lower extremity pain, redness, swelling.  Patient states that he woke up this morning and felt some subjective fever and chills.  He states he did not measure his temperature at home but took some Tylenol because he felt like he had a fever.  He then started noticing he had some worsening pain in his right lower extremity.  He states he always has some bilateral lower extremity pain/swelling secondary to CHF but felt like the right lower extremity was worse than baseline.  He noted some worsening redness, warmth and states he had some areas that were draining.  He wrapped the leg to try and help the swelling to go down.  He denies any known injury.  Patient states that he has been taking his Coumadin and states he has not missed any doses.  Patient reports some mild shortness of breath but states with his COPD, this is not not abnormal for him.  Reports feeling fatigued and having some nausea.  He has not had any cough or chest pain.  Patient denies any recent sick contacts or known COVID-19 exposure.  He denies any abdominal pain, vomiting or diarrhea, nasal congestion, rhinorrhea, numbness/weakness of his extremities.      The history is provided by the patient.    Past Medical History:  Diagnosis Date   Anxiety    hx of    Arthritis    RA   Asthma    Clotting disorder (HCC)    DVT both legs    COPD (chronic obstructive pulmonary disease) (HCC)    DDD (degenerative disc disease), cervical    with UE's paresthesias   Diverticulosis    DVT, lower extremity ([place])    bilat   Eye abnormality    right eye drifts has difficulty focusing with right eye has had since  birth    GERD (gastroesophageal reflux disease)    Gout    Heart murmur    History of measles    History of shingles    Hypercholesterolemia    IBS (irritable bowel syndrome)    IBS (irritable bowel syndrome)    Peripheral edema    Pneumonia    hx of    Prostate cancer ([place])    Seizures ([place])    last seizure 20 years ago; on dilantin. Unknown origin.   Shortness of breath dyspnea    exertion    Sleep apnea    not on cpap   Tubular adenoma of colon 04/2008    Patient Active Problem List   Diagnosis Date Noted   Sepsis due to cellulitis ([place]) 06/07/2019   Voice hoarseness 04/26/2019   OSA (obstructive sleep apnea) 03/10/2019   Abnormal CT of the chest 02/09/2019   Dyspnea on exertion 10/22/2017   History of seizure disorder 02/27/2017   Primary osteoarthritis of both hands 02/27/2017   Primary osteoarthritis of both feet 02/27/2017   Idiopathic gout of multiple sites 02/27/2017   High risk medication use 12/29/2016   History of prostate cancer 12/29/2016   Primary osteoarthritis of both knees 12/29/2016   Chronic idiopathic gout involving toe without tophus 12/29/2016   History of CHF (congestive  heart failure) 12/29/2016   History of COPD 12/29/2016   Plantar pustular psoriasis 12/29/2016   DVT (deep venous thrombosis) ([place]) 10/31/2016   Chronic obstructive pulmonary disease ([place]) 10/31/2016   CHF (congestive heart failure) ([place]) 10/31/2016   Prostate cancer ([place]) 08/30/2015   Malignant neoplasm of prostate ([place]) 07/04/2015   Chest pain at rest 07/05/2014   Weakness 39/02/91   Complicated postphlebitic syndrome 11/16/2013   Personal history of DVT (deep vein thrombosis) 05/18/2013   Chronic anticoagulation 05/18/2013   Diverticulosis of colon without hemorrhage 05/18/2013   Rheumatoid arthritis ([place]) 05/18/2013   Seizure disorder ([place]) 05/18/2013   Hx of adenomatous colonic polyps 05/18/2013   GERD 05/08/2010    NAUSEA 05/08/2010   FLATULENCE-GAS-BLOATING 05/08/2010    Past Surgical History:  Procedure Laterality Date   CHOLECYSTECTOMY     COLONOSCOPY     CYSTOSCOPY WITH LITHOLAPAXY N/A 03/17/2019   Procedure: CYSTOSCOPY WITH LITHOLAPAXY AND REMOVAL OF FOREIGN BODY;  Surgeon: [name], MD;  Location: [facility];  Service: Urology;  Laterality: N/A;   DOPPLER ECHOCARDIOGRAPHY N/A 01-21-2012   TECHNICALLY DIFFICULT. MILD CONCENTRIC LV HYPERTROPHY. LV CAVITY IS SMALL.. EF=> 55%. TRANSMITRAL SPECTRAL FLOW PATTREN IS SUGGESTIVE OF IMPAIRED LV RELAXATION. RV SYSTOLIC PRESSURE IS 33AQTM. LEFT ATRIAL SIZE IS NORMAL. AV APPEARS MILDLY SCLEROTIC. NO SIGN VALVE DISEASE NOTED.   LYMPHADENECTOMY Bilateral 08/30/2015   Procedure: PELVIC LYMPHADENECTOMY;  Surgeon: [name], MD;  Location: [facility];  Service: Urology;  Laterality: Bilateral;   NUCLEAR STRESS TEST N/A 01-21-2012   NORMAL PATTERN OF PERFUSION IN ALL REGIONS. POST STRESS LV SIZE IS NORMAL. NO EVIDENCE OF INDUCIBLE ISCHEMIA. EF 54%.   POLYPECTOMY     PROSTATE BIOPSY     ROBOT ASSISTED LAPAROSCOPIC RADICAL PROSTATECTOMY N/A 08/30/2015   Procedure: ROBOTIC ASSISTED LAPAROSCOPIC RADICAL PROSTATECTOMY;  Surgeon: [name], MD;  Location: [facility];  Service: Urology;  Laterality: N/A;   US VENOUS LOWER EXT Right 03/07/11   PERSISTENT DVT IN RIGHT LOWER EXT.WITH PERSISTANT VISUALIZATION OF HYPOECHOIC THROMBUS WITHIN THE FEMORAL, PROFUNDA FEMORAL AND POPLITEAL VEINS. WHEN COMPARED TO PREVIOUS, CLOT IS NO LONGER IDENTIFIED WITH IN THE RIGHT CFV.        Home Medications    Prior to Admission medications   Medication Sig Start Date End Date Taking? Authorizing Provider  acetaminophen (TYLENOL) 500 MG tablet Take 1,000 mg by mouth every 6 (six) hours as needed (arthritis pain).   Yes [provider]  albuterol (PROVENTIL) (2.5 MG/3ML) 0.083% nebulizer solution Take 3 mLs (2.5 mg total) by nebulization every 6 (six) hours  as needed for wheezing or shortness of breath. 07/05/17  Yes Noemi Chapel, MD  albuterol (PROVENTIL) 2 MG tablet Take 2 mg by mouth daily.    Yes [provider]  alendronate (FOSAMAX) 70 MG tablet TAKE 1 TABLET BY MOUTH ONCE A WEEK. TAKE WITH FULL GLASS OF WATER ON EMPTY STOMACH Patient taking differently: Take 70 mg by mouth every Wednesday. TAKE WITH FULL GLASS OF WATER ON EMPTY STOMACH 03/12/19  Yes Deveshwar, Abel Presto, MD  allopurinol (ZYLOPRIM) 100 MG tablet TAKE 1 TABLET BY MOUTH TWICE A DAY Patient taking differently: Take 100 mg by mouth 2 (two) times daily.  03/22/19  Yes Deveshwar, Abel Presto, MD  BREO ELLIPTA 100-25 MCG/INH AEPB INHALE 1 PUFF BY MOUTH EVERY DAY Patient taking differently: Inhale 1 puff into the lungs at bedtime.  02/04/19  Yes Brand Males, MD  Calcium-Phosphorus-Vitamin D (CITRACAL +D3 PO) Take 2 tablets by mouth  2 (two) times daily.    Yes [provider]  diclofenac sodium (VOLTAREN) 1 % GEL Apply 3 grams to 3 large joints up to 3 times daily Patient taking differently: Apply 3 g topically 3 (three) times daily as needed (pain). Apply 3 grams to 3 large joints up to 3 times daily 10/21/18  Yes Ofilia Neas, PA-C  dicyclomine (BENTYL) 10 MG capsule TAKE 1 CAPSULE BY MOUTH 4 TIMES DAILY BEFORE MEALS AND AT BEDTIME Patient taking differently: Take 20 mg by mouth 2 (two) times daily.  08/11/17  Yes Ladene Artist, MD  folic acid (FOLVITE) 1 MG tablet TAKE 2 TABLETS BY MOUTH EVERY DAY IN THE MORNING Patient taking differently: Take 2 mg by mouth daily.  12/11/18  Yes Deveshwar, Abel Presto, MD  furosemide (LASIX) 40 MG tablet Take 1 tablet (40 mg total) by mouth 2 (two) times daily. 03/01/19  Yes Meng, Hao, PA  INCRUSE ELLIPTA 62.5 MCG/INH AEPB TAKE 1 PUFF BY MOUTH EVERY DAY Patient taking differently: Inhale 1 puff into the lungs every morning.  02/16/19  Yes Brand Males, MD  leflunomide (ARAVA) 20 MG tablet TAKE 1 TABLET BY MOUTH EVERY DAY Patient taking  differently: Take 20 mg by mouth at bedtime.  05/31/19  Yes Deveshwar, Abel Presto, MD  omeprazole (PRILOSEC) 40 MG capsule Take 40 mg by mouth at bedtime.  09/23/16  Yes [provider]  phenytoin (DILANTIN) 100 MG ER capsule Take 100-200 mg by mouth See admin instructions. Take one capsule in the morning and 2 capsules at bedtime   Yes [provider]  Potassium Chloride ER 20 MEQ TBCR Take 20 mEq by mouth daily.  02/24/19  Yes [provider]  Tocilizumab (ACTEMRA IV) Inject into the vein every 28 (twenty-eight) days. For Rheumatoid Arthritis   Yes [provider]  TOVIAZ 8 MG TB24 tablet Take 8 mg by mouth daily. 05/25/19  Yes [provider]  warfarin (COUMADIN) 3 MG tablet Take 3 mg by mouth at bedtime.    Yes [provider]    Family History Family History  Problem Relation Age of Onset   Skin cancer Father    Stomach cancer Father    Heart attack Father    Alzheimer's disease Mother    Throat cancer Brother    Stomach cancer Brother    Lung cancer Brother    Heart attack Brother    Heart attack Brother    Breast cancer Sister    Heart attack Sister    Stroke Sister    Bladder Cancer Sister    Heart murmur Sister    Colon cancer Brother    Colon polyps Neg Hx     Social History Social History   Tobacco Use   Smoking status: Former Smoker    Packs/day: 3.00    Years: 20.00    Pack years: 60.00    Types: Cigarettes    Last attempt to quit: 12/30/1996    Years since quitting: 22.4   Smokeless tobacco: Never Used  Substance Use Topics   Alcohol use: No    Alcohol/week: 0.0 standard drinks   Drug use: No     Allergies   Orencia [abatacept]; Carbamazepine; Celecoxib; Cephalexin; Enbrel [etanercept]; Humira [adalimumab]; Levofloxacin; Sulfa antibiotics; and Sulfasalazine   Review of Systems Review of Systems  Constitutional: Positive for chills, fatigue and fever.  Respiratory: Positive for  shortness of breath. Negative for cough.   Cardiovascular: Negative for chest pain.  Gastrointestinal: Positive for  nausea. Negative for abdominal pain and vomiting.  Genitourinary: Negative for dysuria and hematuria.  Musculoskeletal:       RLE pain  Skin: Positive for color change.  Neurological: Negative for weakness, numbness and headaches.  All other systems reviewed and are negative.    Physical Exam Updated Vital Signs BP 109/69    Pulse 100    Temp (!) 101.3 F (38.5 C) (Oral)    Resp (!) 21    Ht 5\' 8"  (1.727 m)    Wt 110 kg    SpO2 98%    BMI 36.87 kg/m   Physical Exam Vitals signs and nursing note reviewed.  Constitutional:      Appearance: Normal appearance. He is well-developed.  HENT:     Head: Normocephalic and atraumatic.  Eyes:     General: Lids are normal.     Conjunctiva/sclera: Conjunctivae normal.     Pupils: Pupils are equal, round, and reactive to light.  Neck:     Musculoskeletal: Full passive range of motion without pain.  Cardiovascular:     Rate and Rhythm: Normal rate and regular rhythm.     Pulses: Normal pulses.          Radial pulses are 2+ on the right side and 2+ on the left side.       Dorsalis pedis pulses are 2+ on the right side and 2+ on the left side.     Heart sounds: Normal heart sounds. No murmur. No friction rub. No gallop.   Pulmonary:     Effort: Pulmonary effort is normal. Tachypnea present.     Breath sounds: Wheezing present.     Comments: Mild expiratory wheezing noted.  Tachypnea with mild increased work of breathing noted but he is able to talk in medium-long sentences.  No rales noted. Abdominal:     Palpations: Abdomen is soft. Abdomen is not rigid.     Tenderness: There is no abdominal tenderness. There is no guarding.     Comments: Abdomen is soft, non-distended, non-tender. No rigidity, No guarding. No peritoneal signs.  Musculoskeletal: Normal range of motion.     Comments: Diffuse tenderness noted to right lower  extremity.  He has 2 small wounds noted to the anterior aspect of the right lower extremity.  No active drainage.  1+ pitting edema noted bilateral lower extremities, right leg worse on right lower extremity.  Skin:    General: Skin is warm and dry.     Capillary Refill: Capillary refill takes less than 2 seconds.     Comments: Circumferential erythema/warmth/edema/induration noted to the proximal tib-fib that extends distally towards the ankle. Good distal cap refill. RUE is not dusky in appearance or cool to touch.  Neurological:     Mental Status: He is alert and oriented to person, place, and time.  Psychiatric:        Speech: Speech normal.      ED Treatments / Results  Labs (all labs ordered are listed, but only abnormal results are displayed) Labs Reviewed  COMPREHENSIVE METABOLIC PANEL - Abnormal; Notable for the following components:      Result Value   Glucose, Bld 120 (*)    GFR calc non Af Amer 59 (*)    All other components within normal limits  LACTIC ACID, PLASMA - Abnormal; Notable for the following components:   Lactic Acid, Venous 3.0 (*)    All other components within normal limits  LACTIC ACID, PLASMA - Abnormal; Notable for the  following components:   Lactic Acid, Venous 3.1 (*)    All other components within normal limits  CBC WITH DIFFERENTIAL/PLATELET - Abnormal; Notable for the following components:   WBC 18.6 (*)    Neutro Abs 16.3 (*)    Lymphs Abs 0.6 (*)    Eosinophils Absolute 1.2 (*)    All other components within normal limits  PROTIME-INR - Abnormal; Notable for the following components:   Prothrombin Time 25.8 (*)    INR 2.4 (*)    All other components within normal limits  CULTURE, BLOOD (ROUTINE X 2)  CULTURE, BLOOD (ROUTINE X 2)  SARS CORONAVIRUS 2 (HOSPITAL ORDER, [place])  BRAIN NATRIURETIC PEPTIDE  MAGNESIUM  PHOSPHORUS    EKG EKG Interpretation  Date/Time:  Monday June 07 2019 13:48:19  EDT Ventricular Rate:  101 PR Interval:    QRS Duration: 96 QT Interval:  338 QTC Calculation: 439 R Axis:   75 Text Interpretation:  Sinus tachycardia Abnormal R-wave progression, early transition Minimal ST depression, diffuse leads When compared with ECG of 09/25/2017 Nonspecific ST and T wave abnormality is now Present Confirmed by Francine Graven [phone]) on 06/07/2019 2:02:10 PM   Radiology Dg Chest 2 View  Result Date: 06/07/2019 CLINICAL DATA:  Shortness of breath and fever EXAM: CHEST - 2 VIEW COMPARISON:  07/05/2017 FINDINGS: There is elevation of the left diaphragm unchanged from the prior exams. There is no focal consolidation. There is no pleural effusion or pneumothorax. The heart and mediastinal contours are unremarkable. There is thoracic aortic atherosclerosis. The osseous structures are unremarkable. IMPRESSION: No active cardiopulmonary disease. Electronically Signed   By: Kathreen Devoid   On: 06/07/2019 13:17   US Venous Img Lower Right (dvt Study)  Result Date: 06/07/2019 CLINICAL DATA:  Pain and edema EXAM: RIGHT LOWER EXTREMITY VENOUS DOPPLER ULTRASOUND TECHNIQUE: Gray-scale sonography with compression, as well as color and duplex ultrasound, were performed to evaluate the deep venous system from the level of the common femoral vein through the popliteal and proximal calf veins. COMPARISON:  03/15/2016 FINDINGS: Normal compressibility of the right common femoral vein, visualized profunda femoral vein, and saphenofemoral junction. There is wall thickening in relatively diminutive femoral and popliteal veins, incompletely compressible, with persistent flow signal identified on color Doppler. Monophasic waveforms. Small changes in the visualized calf veins, with flow signal on color Doppler. No definite acute DVT. Survey views of the contralateral left common femoral vein are unremarkable. IMPRESSION: A 1. Extensive post thrombotic change in the RIGHT femoral and popliteal veins and  visualized calf veins. No definite acute DVT. For Electronically Signed   By: Lucrezia Europe M.D.   On: 06/07/2019 15:02    Procedures .Critical Care Performed by: Volanda Napoleon, PA-C Authorized by: Volanda Napoleon, PA-C   Critical care provider statement:    Critical care time (minutes):  45   Critical care was necessary to treat or prevent imminent or life-threatening deterioration of the following conditions:  Sepsis   Critical care was time spent personally by me on the following activities:  Discussions with consultants, evaluation of patient's response to treatment, examination of patient, ordering and performing treatments and interventions, ordering and review of laboratory studies, ordering and review of radiographic studies, pulse oximetry, re-evaluation of patient's condition, obtaining history from patient or surrogate and review of old charts   (including critical care time)  Medications Ordered in ED Medications  sodium chloride flush (NS) 0.9 % injection 3 mL (3 mLs  Intravenous Not Given 06/07/19 1315)  vancomycin (VANCOCIN) IVPB 1000 mg/200 mL premix (0 mg Intravenous Stopped 06/07/19 1640)  ondansetron (ZOFRAN) tablet 4 mg (has no administration in time range)    Or  ondansetron (ZOFRAN) injection 4 mg (has no administration in time range)  acetaminophen (TYLENOL) tablet 650 mg (has no administration in time range)    Or  acetaminophen (TYLENOL) suppository 650 mg (has no administration in time range)  Warfarin - Pharmacist Dosing Inpatient (has no administration in time range)  warfarin (COUMADIN) tablet 3 mg (has no administration in time range)  acetaminophen (TYLENOL) tablet 1,000 mg (1,000 mg Oral Given 06/07/19 1349)  ceFEPIme (MAXIPIME) 2 g in sodium chloride 0.9 % 100 mL IVPB (0 g Intravenous Stopped 06/07/19 1428)  albuterol (VENTOLIN HFA) 108 (90 Base) MCG/ACT inhaler 2 puff (2 puffs Inhalation Given 06/07/19 1349)  sodium chloride 0.9 % bolus 1,000 mL (0 mLs  Intravenous Stopped 06/07/19 1640)     Initial Impression / Assessment and Plan / ED Course  I have reviewed the triage vital signs and the nursing notes.  Pertinent labs & imaging results that were available during my care of the patient were reviewed by me and considered in my medical decision making (see chart for details).        68 year old male who presents for evaluation of fever and right lower extremity pain that began today.  Also endorses some shortness of breath but does report a history of COPD so he states at baseline, he always has some shortness of breath.  On initial ED arrival, he is febrile, tachypneic, tachycardic and hypertensive.  O2 sat stable.  On exam, he does have some mild expiratory wheezing but is able to talk in medium-long sentences.  No rales noted.  Right lower extremity is diffusely, circumferentially warm, erythematous, edematous.  He does have 2 small wounds on his anterior aspect with no active drainage.  Concern for sepsis possibly secondary to cellulitis.  He does have a history of DVTs secondary to clotting disorder.  He is on Coumadin and states he has been compliant with medications.  Patient initially was assigned back to the room but was unable to be found.  I did not see the patient until about 1:15 PM so we were able to locate him. At that time, code sepsis was initiated.  There was no delay in calling code sepsis once I evaluated the patient.  Lactic acid 3.0.  PT/INR is 25.8/2.4.  CBC shows leukocytosis of 18.6.  CMP shows glucose of 120.  Otherwise unremarkable.  COVID negative.  Chest x-ray shows no evidence of acute infectious etiology.  No evidence of cardiomegaly, findings of pulmonary edema. DVT study negative for acute DVT.   Patient does have history of CHF.  Given that his lactate is less than 4 and he is not hypotensive, will start off with 1 L fluid bolus.  Additionally, patient placed on broad-spectrum antibiotics.  He does report a history  of allergies to Keflex.  Discussed with pharmacy.  He has tolerated Ancef most recently in March 2020.  Will do Vanco and cefepime.  Given concerns for sepsis, will plan for admission.  Repeat lactic acid is 3.1.  This was drawn before he got fluids.   Discussed patient with Dr. Olevia Bowens (hospitalist). Plan for admission.   Portions of this note were generated with Lobby[employer]. Dictation errors may occur despite best attempts at proofreading.    Final Clinical Impressions(s) / ED Diagnoses  Final diagnoses:  Sepsis, due to unspecified organism, unspecified whether acute organ dysfunction present [facility])    ED Discharge Orders    None       Volanda Napoleon, PA-C 06/07/19 [place], [place], DO 06/11/19 1653

## 2019-06-07 NOTE — ED Triage Notes (Signed)
Ok with went to bed last night   This am fever, sore swollen R leg with difficulty ambulating   Shaking   Short of breath

## 2019-06-07 NOTE — Progress Notes (Signed)
Pharmacy Antibiotic Note  [name] is a 68 y.o. male admitted on 06/07/2019 with sepsis.  Pharmacy has been consulted for Vancomycin and cipro dosing.  Plan: Vancomycin 2000mg  loading dose, then 1250mg  IV every 24 hours.  Goal trough 15-20 mcg/mL.  Cipro 400mg   IV q12h F/U cxs and clinical progress Monitor V/S, labs and levels as indicated  Height: 5\' 8"  (172.7 cm) Weight: 242 lb 8.1 oz (110 kg) IBW/kg (Calculated) : 68.4  Temp (24hrs), Avg:101.3 F (38.5 C), Min:101.3 F (38.5 C), Max:101.3 F (38.5 C)  Recent Labs  Lab 06/07/19 1216 06/07/19 1224  WBC 18.6*  --   CREATININE 1.24  --   LATICACIDVEN  --  3.0*    Normalized CrCL is 74mls/min Estimated Creatinine Clearance: 68.5 mL/min (by C-G formula based on SCr of 1.24 mg/dL).    Allergies  Allergen Reactions  . Orencia [Abatacept] Anaphylaxis    Had prostate cancer  . Carbamazepine Rash    REACTION: makes drowsy BRAND NAME IS TEGRETOL  . Celecoxib Itching and Rash    BRAND NAME IS CELEBREX  . Cephalexin Rash    "Severe Rash".  BRAND NAME IS KEFLEX.   . Enbrel [Etanercept] Rash  . Humira [Adalimumab] Rash  . Levofloxacin Other (See Comments)    REACTION: GI Intolerance BRAND NAME IS LEVAQUIN  . Sulfa Antibiotics Rash  . Sulfasalazine Rash    Antimicrobials this admission: Cipro 6/8>> Vancomycin 6/8 >>  Cefepime x 1 dose in ED 6/8 (pt allergy to keflex)  Dose adjustments this admission: n/a  Microbiology results: 6/8 BCx: pending 6/8 SARS CV is negative  MRSA PCR:  Thank you for allowing pharmacy to be a part of this patient's care.  Isac Sarna, BS Pharm D, BCPS Clinical Pharmacist Pager [phone] 06/07/2019 1:30 PM

## 2019-06-07 NOTE — H&P (Signed)
History and Physical    [name] DOB: Jan 18, 1951 DOA: 06/07/2019  PCP: [name], MD   Patient coming from: Home.  I have personally briefly reviewed patient's old medical records in [place]  Chief Complaint: Fever, pain and swelling on right leg.  HPI: [name] is a 68 y.o. male with medical history significant of history of anxiety, osteoarthritis, history of rheumatoid Tritus, asthma/COPD, history of DVT of both lower extremities, diverticulosis, GERD, gout, history of heart murmur, measles, shingles, hyperlipidemia, IBS, history of peripheral edema, history of pneumonia, history of prostate cancer, seizure disorder with last episode over 20 years ago sleep apnea on home CPAP who is coming to the emergency department with complaints of waking up with decreased appetite, nausea, malaise, developed pain fever, pain, erythema, calor and edema over his right lower extremities.  He notices that he has some chronic edema and some dermatitis changes.  He denies trauma to the area, but mentions he has chronic inflammation of the area.  No previous episodes of cellulitis per patient.  He denies dyspnea, wheezing, hemoptysis, chest pain, palpitations, diaphoresis, abdominal pain, diarrhea, constipation, melena or hematochezia.  No dysuria, frequency or hematuria.  Denies polyuria, polydipsia, polyphagia or blurred vision.  ED Course: Initial vital signs were temperature 101.3 F, pulse 107, respirations 24, blood pressure 179/76 mmHg and O2 sat 94% on room air.  The patient received a 1000 mL NS bolus, cefepime and vancomycin in the emergency department.  Patient has a history of allergy to Keflex, but did not have a reaction to cefepime so far.  Antibiotic coverage was switched to ciprofloxacin per pharmacy suggestion.  White count was 18.6 with 89% neutrophils, hemoglobin 15.2 g/dL and platelets 160.  PT was 25.8 seconds and INR 2.4.  His CMP shows a glucose of  120 mg/dL but is otherwise within normal limits.  Lactic acid has been 3.0 and 3.1 mmol/L.  BNP was 38.0 pg milliliter.  Magnesium relatively low at 1.7 and phosphorus 2.4 mg/dL. His chest radiograph does not show any active cardiopulmonary disease.  Lower extremity venous Doppler showed extensive post thrombotic change in the right femoral and popliteal veins and visualized calf veins, but there was no definite acute DVT.  Please see images and full radiology report for further detail.  Review of Systems: As per HPI otherwise 10 point review of systems negative.   Past Medical History:  Diagnosis Date  . Anxiety    hx of   . Arthritis    RA  . Asthma   . Clotting disorder (HCC)    DVT both legs   . COPD (chronic obstructive pulmonary disease) ([place])   . DDD (degenerative disc disease), cervical    with UE's paresthesias  . Diverticulosis   . DVT, lower extremity ([place])    bilat  . 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 GERD (gastroesophageal reflux disease)   . Gout   . Heart murmur   . History of measles   . History of shingles   . Hypercholesterolemia   . IBS (irritable bowel syndrome)   . IBS (irritable bowel syndrome)   . Peripheral edema   . Pneumonia    hx of   . Prostate cancer ([place])   . Seizures ([place])    last seizure 20 years ago; on dilantin. Unknown origin.  [name] Shortness of breath dyspnea    exertion   .  Sleep apnea    not on cpap  . Tubular adenoma of colon 04/2008    Past Surgical History:  Procedure Laterality Date  . CHOLECYSTECTOMY    . COLONOSCOPY    . CYSTOSCOPY WITH LITHOLAPAXY N/A 03/17/2019   Procedure: CYSTOSCOPY WITH LITHOLAPAXY AND REMOVAL OF FOREIGN BODY;  Surgeon: [name], MD;  Location: [facility];  Service: Urology;  Laterality: N/A;  . DOPPLER ECHOCARDIOGRAPHY N/A 01-21-2012   TECHNICALLY DIFFICULT. MILD CONCENTRIC LV HYPERTROPHY. LV CAVITY IS SMALL.. EF=> 55%. TRANSMITRAL  SPECTRAL FLOW PATTREN IS SUGGESTIVE OF IMPAIRED LV RELAXATION. RV SYSTOLIC PRESSURE IS 57QION. LEFT ATRIAL SIZE IS NORMAL. AV APPEARS MILDLY SCLEROTIC. NO SIGN VALVE DISEASE NOTED.  [name] LYMPHADENECTOMY Bilateral 08/30/2015   Procedure: PELVIC LYMPHADENECTOMY;  Surgeon: [name], MD;  Location: [facility];  Service: Urology;  Laterality: Bilateral;  . NUCLEAR STRESS TEST N/A 01-21-2012   NORMAL PATTERN OF PERFUSION IN ALL REGIONS. POST STRESS LV SIZE IS NORMAL. NO EVIDENCE OF INDUCIBLE ISCHEMIA. EF 54%.  [name] POLYPECTOMY    . PROSTATE BIOPSY    . ROBOT ASSISTED LAPAROSCOPIC RADICAL PROSTATECTOMY N/A 08/30/2015   Procedure: ROBOTIC ASSISTED LAPAROSCOPIC RADICAL PROSTATECTOMY;  Surgeon: [name], MD;  Location: [facility];  Service: Urology;  Laterality: N/A;  . US VENOUS LOWER EXT Right 03/07/11   PERSISTENT DVT IN RIGHT LOWER EXT.WITH PERSISTANT VISUALIZATION OF HYPOECHOIC THROMBUS WITHIN THE FEMORAL, PROFUNDA FEMORAL AND POPLITEAL VEINS. WHEN COMPARED TO PREVIOUS, CLOT IS NO LONGER IDENTIFIED WITH IN THE RIGHT CFV.     reports that he quit smoking about 22 years ago. His smoking use included cigarettes. He has a 60.00 pack-year smoking history. He has never used smokeless tobacco. He reports that he does not drink alcohol or use drugs.  Allergies  Allergen Reactions  . Orencia [Abatacept] Anaphylaxis    Had prostate cancer  . Carbamazepine Rash    REACTION: makes drowsy BRAND NAME IS TEGRETOL  . Celecoxib Itching and Rash    BRAND NAME IS CELEBREX  . Cephalexin Rash    "Severe Rash".  BRAND NAME IS KEFLEX.   . Enbrel [Etanercept] Rash  . Humira [Adalimumab] Rash  . Levofloxacin Other (See Comments)    REACTION: GI Intolerance BRAND NAME IS LEVAQUIN  . Sulfa Antibiotics Rash  . Sulfasalazine Rash    Family History  Problem Relation Age of Onset  . Skin cancer Father   . Stomach cancer Father   . Heart attack Father   . Alzheimer's disease Mother   . Throat cancer Brother    . Stomach cancer Brother   . Lung cancer Brother   . Heart attack Brother   . Heart attack Brother   . Breast cancer Sister   . Heart attack Sister   . Stroke Sister   . Bladder Cancer Sister   . Heart murmur Sister   . Colon cancer Brother   . Colon polyps Neg Hx    Prior to Admission medications   Medication Sig Start Date End Date Taking? Authorizing Provider  acetaminophen (TYLENOL) 500 MG tablet Take 1,000 mg by mouth every 6 (six) hours as needed (arthritis pain).   Yes [provider]  albuterol (PROVENTIL) (2.5 MG/3ML) 0.083% nebulizer solution Take 3 mLs (2.5 mg total) by nebulization every 6 (six) hours as needed for wheezing or shortness of breath. 07/05/17  Yes Noemi Chapel, MD  albuterol (PROVENTIL) 2 MG tablet Take 2 mg by mouth daily.    Yes [provider]  alendronate (FOSAMAX) 70 MG tablet TAKE 1 TABLET BY MOUTH ONCE A WEEK. TAKE WITH FULL GLASS OF WATER ON EMPTY STOMACH Patient taking differently: Take 70 mg by mouth every Wednesday. TAKE WITH FULL GLASS OF WATER ON EMPTY STOMACH 03/12/19  Yes Deveshwar, Abel Presto, MD  allopurinol (ZYLOPRIM) 100 MG tablet TAKE 1 TABLET BY MOUTH TWICE A DAY Patient taking differently: Take 100 mg by mouth 2 (two) times daily.  03/22/19  Yes Deveshwar, Abel Presto, MD  BREO ELLIPTA 100-25 MCG/INH AEPB INHALE 1 PUFF BY MOUTH EVERY DAY Patient taking differently: Inhale 1 puff into the lungs at bedtime.  02/04/19  Yes Brand Males, MD  Calcium-Phosphorus-Vitamin D (CITRACAL +D3 PO) Take 2 tablets by mouth 2 (two) times daily.    Yes [provider]  diclofenac sodium (VOLTAREN) 1 % GEL Apply 3 grams to 3 large joints up to 3 times daily Patient taking differently: Apply 3 g topically 3 (three) times daily as needed (pain). Apply 3 grams to 3 large joints up to 3 times daily 10/21/18  Yes Ofilia Neas, PA-C  dicyclomine (BENTYL) 10 MG capsule TAKE 1 CAPSULE BY MOUTH 4 TIMES DAILY BEFORE MEALS AND AT BEDTIME Patient  taking differently: Take 20 mg by mouth 2 (two) times daily.  08/11/17  Yes Ladene Artist, MD  folic acid (FOLVITE) 1 MG tablet TAKE 2 TABLETS BY MOUTH EVERY DAY IN THE MORNING Patient taking differently: Take 2 mg by mouth daily.  12/11/18  Yes Deveshwar, Abel Presto, MD  furosemide (LASIX) 40 MG tablet Take 1 tablet (40 mg total) by mouth 2 (two) times daily. 03/01/19  Yes Meng, Hao, PA  INCRUSE ELLIPTA 62.5 MCG/INH AEPB TAKE 1 PUFF BY MOUTH EVERY DAY Patient taking differently: Inhale 1 puff into the lungs every morning.  02/16/19  Yes Brand Males, MD  leflunomide (ARAVA) 20 MG tablet TAKE 1 TABLET BY MOUTH EVERY DAY Patient taking differently: Take 20 mg by mouth at bedtime.  05/31/19  Yes Deveshwar, Abel Presto, MD  omeprazole (PRILOSEC) 40 MG capsule Take 40 mg by mouth at bedtime.  09/23/16  Yes [provider]  phenytoin (DILANTIN) 100 MG ER capsule Take 100-200 mg by mouth See admin instructions. Take one capsule in the morning and 2 capsules at bedtime   Yes [provider]  Potassium Chloride ER 20 MEQ TBCR Take 20 mEq by mouth daily.  02/24/19  Yes [provider]  Tocilizumab (ACTEMRA IV) Inject into the vein every 28 (twenty-eight) days. For Rheumatoid Arthritis   Yes [provider]  TOVIAZ 8 MG TB24 tablet Take 8 mg by mouth daily. 05/25/19  Yes [provider]  warfarin (COUMADIN) 3 MG tablet Take 3 mg by mouth at bedtime.    Yes [provider]    Physical Exam: Vitals:   06/07/19 1152 06/07/19 1153 06/07/19 1400 06/07/19 1430  BP: (!) 179/76  122/69 109/69  Pulse: (!) 107  (!) 101 100  Resp: (!) 24  (!) 23 (!) 21  Temp: (!) 101.3 F (38.5 C)     TempSrc: Oral     SpO2: 94%  97% 98%  Weight:  110 kg    Height:  5\' 8"  (1.727 m)      Constitutional: Mildly febrile, but otherwise NAD, calm, comfortable Eyes: PERRL, lids and conjunctivae normal ENMT: Mucous membranes are mildly dry.  Posterior pharynx clear of any exudate or  lesions. Neck: normal, supple, no masses, no thyromegaly Respiratory: Decreased breath sounds on bases, otherwise clear  to auscultation bilaterally, no wheezing, no crackles. Normal respiratory effort. No accessory muscle use.  Cardiovascular: Regular rate and rhythm, no murmurs / rubs / gallops. No extremity edema. 2+ pedal pulses. No carotid bruits.  Abdomen: Obese, soft, no tenderness, no masses palpated. No hepatosplenomegaly. Bowel sounds positive.  Musculoskeletal: no clubbing / cyanosis. Good ROM, no contractures. Normal muscle tone.  Skin: Significant edema, erythema, calor and mild tenderness to palpation of right pretibial area.  Please see pictures below.  Please compare with noninfected lower extremity. Neurologic: CN 2-12 grossly intact. Sensation intact, DTR normal. Strength 5/5 in all 4.  Psychiatric: Normal judgment and insight. Alert and oriented x 3. Normal mood.         Labs on Admission: I have personally reviewed following labs and imaging studies  CBC: Recent Labs  Lab 06/07/19 1216  WBC 18.6*  NEUTROABS 16.3*  HGB 15.2  HCT 47.4  MCV 93.9  PLT 213   Basic Metabolic Panel: Recent Labs  Lab 06/07/19 1216  NA 142  K 3.9  CL 102  CO2 25  GLUCOSE 120*  BUN 12  CREATININE 1.24  CALCIUM 9.5  MG 1.7  PHOS 2.4*   GFR: Estimated Creatinine Clearance: 68.5 mL/min (by C-G formula based on SCr of 1.24 mg/dL). Liver Function Tests: Recent Labs  Lab 06/07/19 1216  AST 30  ALT 24  ALKPHOS 64  BILITOT 1.0  PROT 6.6  ALBUMIN 4.2   No results for input(s): LIPASE, AMYLASE in the last 168 hours. No results for input(s): AMMONIA in the last 168 hours. Coagulation Profile: Recent Labs  Lab 06/07/19 1216  INR 2.4*   Cardiac Enzymes: No results for input(s): CKTOTAL, CKMB, CKMBINDEX, TROPONINI in the last 168 hours. BNP (last 3 results) Recent Labs    02/08/19 1437  PROBNP 26.0   HbA1C: No results for input(s): HGBA1C in the last 72 hours.  CBG: No results for input(s): GLUCAP in the last 168 hours. Lipid Profile: No results for input(s): CHOL, HDL, LDLCALC, TRIG, CHOLHDL, LDLDIRECT in the last 72 hours. Thyroid Function Tests: No results for input(s): TSH, T4TOTAL, FREET4, T3FREE, THYROIDAB in the last 72 hours. Anemia Panel: No results for input(s): VITAMINB12, FOLATE, FERRITIN, TIBC, IRON, RETICCTPCT in the last 72 hours. Urine analysis:    Component Value Date/Time   COLORURINE YELLOW 11/29/2018 1033   APPEARANCEUR HAZY (A) 11/29/2018 1033   LABSPEC 1.025 11/29/2018 1033   PHURINE 5.0 11/29/2018 1033   GLUCOSEU NEGATIVE 11/29/2018 1033   HGBUR MODERATE (A) 11/29/2018 1033   BILIRUBINUR NEGATIVE 11/29/2018 1033   KETONESUR NEGATIVE 11/29/2018 1033   PROTEINUR 30 (A) 11/29/2018 1033   UROBILINOGEN 0.2 09/24/2012 1640   NITRITE NEGATIVE 11/29/2018 1033   LEUKOCYTESUR NEGATIVE 11/29/2018 1033    Radiological Exams on Admission: Dg Chest 2 View  Result Date: 06/07/2019 CLINICAL DATA:  Shortness of breath and fever EXAM: CHEST - 2 VIEW COMPARISON:  07/05/2017 FINDINGS: There is elevation of the left diaphragm unchanged from the prior exams. There is no focal consolidation. There is no pleural effusion or pneumothorax. The heart and mediastinal contours are unremarkable. There is thoracic aortic atherosclerosis. The osseous structures are unremarkable. IMPRESSION: No active cardiopulmonary disease. Electronically Signed   By: Kathreen Devoid   On: 06/07/2019 13:17   US Venous Img Lower Right (dvt Study)  Result Date: 06/07/2019 CLINICAL DATA:  Pain and edema EXAM: RIGHT LOWER EXTREMITY VENOUS DOPPLER ULTRASOUND TECHNIQUE: Gray-scale sonography with compression, as well as color and duplex ultrasound,  were performed to evaluate the deep venous system from the level of the common femoral vein through the popliteal and proximal calf veins. COMPARISON:  03/15/2016 FINDINGS: Normal compressibility of the right common femoral vein,  visualized profunda femoral vein, and saphenofemoral junction. There is wall thickening in relatively diminutive femoral and popliteal veins, incompletely compressible, with persistent flow signal identified on color Doppler. Monophasic waveforms. Small changes in the visualized calf veins, with flow signal on color Doppler. No definite acute DVT. Survey views of the contralateral left common femoral vein are unremarkable. IMPRESSION: A 1. Extensive post thrombotic change in the RIGHT femoral and popliteal veins and visualized calf veins. No definite acute DVT. For Electronically Signed   By: Lucrezia Europe M.D.   On: 06/07/2019 15:02    EKG: Independently reviewed. Vent. rate 101 BPM PR interval * ms QRS duration 96 ms QT/QTc 338/439 ms P-R-T axes 57 75 66 Sinus tachycardia Abnormal R-wave progression, early transition Minimal ST depression, diffuse leads  Assessment/Plan Principal Problem:   Sepsis due to cellulitis ([place]) Admit to stepdown/inpatient. Continue IV hydration. Ciprofloxacin per pharmacy. Vancomycin per pharmacy. Continue local care. Analgesics as needed. Follow-up blood cultures and sensitivity. Follow-up lactic acid, CBC with differential and CMP.  Active Problems:   Personal history of DVT (deep vein thrombosis) Warfarin per pharmacy. Daily PT/INR.    GERD Continue PPI.    Rheumatoid arthritis ([place]) Hold Arava. Analgesics as needed.    Chronic obstructive pulmonary disease (HCC) Supplemental oxygen as needed. Bronchodilators as needed.    History of prostate cancer Continue Toviaz. Follow-up with urology next week as scheduled.    History of CHF (congestive heart failure) Check echocardiogram.    Idiopathic gout of multiple sites Continue allopurinol 100 mg p.o. twice daily.    OSA (obstructive sleep apnea) Continue CPAP ventilation at bedtime.    DVT prophylaxis: On warfarin. Code Status: Full code. Family Communication: Disposition Plan: Admit  for IV antibiotic therapy for 2 to 3 days. Consults called:  Admission status: Inpatient/stepdown.   Reubin Milan MD [facility]  06/07/2019, 5:22 PM   This document was prepared using Dragon voice recognition software and may contain some unintended transcription errors.

## 2019-06-07 NOTE — ED Notes (Signed)
Pt unable to be located. Security looking for pt as he was in wheelchair in private waiting area behind triage.

## 2019-06-07 NOTE — ED Notes (Signed)
Date and time results received: 06/07/19  1502 (use smartphrase ".now" to insert current time)  Test: Lactic Critical Value:3.1  Name of Provider Notified:Dr Gilford Raid  Orders Received? Or Actions Taken?: NA

## 2019-06-07 NOTE — ED Notes (Signed)
Date and time results received: 06/07/19 1:07 PM  (use smartphrase ".now" to insert current time)  Test: Lactic Critical Value: 3.0  Name of Provider Notified: Mendel Ryder PA  Orders Received? Or Actions Taken?: Orders Received - See Orders for details

## 2019-06-07 NOTE — ED Notes (Signed)
Date and time results received: 06/07/19 2038  Test: lactic Critical Value: 2.5  Name of Provider Notified: Olevia Bowens   Orders Received? Or Actions Taken?: n/a

## 2019-06-07 NOTE — ED Notes (Signed)
Date and time results received: 06/07/19 7:50 PM (use smartphrase ".now" to insert current time)  Test: lactic acid Critical Value: 2.2  Name of Provider Notified: Dr Olevia Bowens  Orders Received? Or Actions Taken?: no new orders received.

## 2019-06-08 ENCOUNTER — Ambulatory Visit: Payer: Medicare Other | Admitting: Pulmonary Disease

## 2019-06-08 ENCOUNTER — Inpatient Hospital Stay (HOSPITAL_COMMUNITY): Payer: Medicare Other

## 2019-06-08 ENCOUNTER — Telehealth: Payer: Self-pay | Admitting: Pulmonary Disease

## 2019-06-08 DIAGNOSIS — R509 Fever, unspecified: Secondary | ICD-10-CM

## 2019-06-08 DIAGNOSIS — A4153 Sepsis due to Serratia: Principal | ICD-10-CM

## 2019-06-08 DIAGNOSIS — M069 Rheumatoid arthritis, unspecified: Secondary | ICD-10-CM

## 2019-06-08 DIAGNOSIS — G4733 Obstructive sleep apnea (adult) (pediatric): Secondary | ICD-10-CM | POA: Diagnosis not present

## 2019-06-08 DIAGNOSIS — R7881 Bacteremia: Secondary | ICD-10-CM

## 2019-06-08 LAB — CBC WITH DIFFERENTIAL/PLATELET
Abs Immature Granulocytes: 0.15 10*3/uL — ABNORMAL HIGH (ref 0.00–0.07)
Basophils Absolute: 0.1 10*3/uL (ref 0.0–0.1)
Basophils Relative: 0 %
Eosinophils Absolute: 0.2 10*3/uL (ref 0.0–0.5)
Eosinophils Relative: 1 %
HCT: 39.5 % (ref 39.0–52.0)
Hemoglobin: 12.6 g/dL — ABNORMAL LOW (ref 13.0–17.0)
Immature Granulocytes: 1 %
Lymphocytes Relative: 4 %
Lymphs Abs: 0.8 10*3/uL (ref 0.7–4.0)
MCH: 30.1 pg (ref 26.0–34.0)
MCHC: 31.9 g/dL (ref 30.0–36.0)
MCV: 94.3 fL (ref 80.0–100.0)
Monocytes Absolute: 0.6 10*3/uL (ref 0.1–1.0)
Monocytes Relative: 3 %
Neutro Abs: 16.3 10*3/uL — ABNORMAL HIGH (ref 1.7–7.7)
Neutrophils Relative %: 91 %
Platelets: 130 10*3/uL — ABNORMAL LOW (ref 150–400)
RBC: 4.19 MIL/uL — ABNORMAL LOW (ref 4.22–5.81)
RDW: 14 % (ref 11.5–15.5)
WBC: 18.1 10*3/uL — ABNORMAL HIGH (ref 4.0–10.5)
nRBC: 0 % (ref 0.0–0.2)

## 2019-06-08 LAB — COMPREHENSIVE METABOLIC PANEL
ALT: 19 U/L (ref 0–44)
AST: 29 U/L (ref 15–41)
Albumin: 3.2 g/dL — ABNORMAL LOW (ref 3.5–5.0)
Alkaline Phosphatase: 46 U/L (ref 38–126)
Anion gap: 8 (ref 5–15)
BUN: 14 mg/dL (ref 8–23)
CO2: 22 mmol/L (ref 22–32)
Calcium: 8.2 mg/dL — ABNORMAL LOW (ref 8.9–10.3)
Chloride: 109 mmol/L (ref 98–111)
Creatinine, Ser: 1.2 mg/dL (ref 0.61–1.24)
GFR calc Af Amer: >60 mL/min (ref >60)
GFR calc non Af Amer: >60 mL/min (ref >60)
Glucose, Bld: 112 mg/dL — ABNORMAL HIGH (ref 70–99)
Potassium: 3.7 mmol/L (ref 3.5–5.1)
Sodium: 139 mmol/L (ref 135–145)
Total Bilirubin: 0.6 mg/dL (ref 0.3–1.2)
Total Protein: 5.3 g/dL — ABNORMAL LOW (ref 6.5–8.1)

## 2019-06-08 LAB — BLOOD CULTURE ID PANEL (REFLEXED)

## 2019-06-08 LAB — ECHOCARDIOGRAM COMPLETE
Height: 68 in
Weight: 3714.31 oz

## 2019-06-08 LAB — PROTIME-INR
INR: 2.5 — ABNORMAL HIGH (ref 0.8–1.2)
Prothrombin Time: 26.6 seconds — ABNORMAL HIGH (ref 11.4–15.2)

## 2019-06-08 LAB — MRSA PCR SCREENING: MRSA by PCR: NEGATIVE

## 2019-06-08 MED ORDER — VANCOMYCIN HCL 10 G IV SOLR
1250.0000 mg | INTRAVENOUS | Status: DC
  Filled 2019-06-08: qty 1250

## 2019-06-08 MED ORDER — SALINE SPRAY 0.65 % NA SOLN
1.0000 | NASAL | Status: DC | PRN
  Administered 2019-06-08: 1 via NASAL
  Filled 2019-06-08: qty 44

## 2019-06-08 MED ORDER — SODIUM CHLORIDE 0.9 % IV SOLN
2.0000 g | Freq: Three times a day (TID) | INTRAVENOUS | Status: DC
  Administered 2019-06-08: 2 g via INTRAVENOUS
  Filled 2019-06-08: qty 2

## 2019-06-08 MED ORDER — BUDESONIDE 0.5 MG/2ML IN SUSP
0.5000 mg | Freq: Two times a day (BID) | RESPIRATORY_TRACT | Status: DC
  Administered 2019-06-08 – 2019-06-09 (×3): 0.5 mg via RESPIRATORY_TRACT
  Filled 2019-06-08 (×3): qty 2

## 2019-06-08 MED ORDER — IPRATROPIUM-ALBUTEROL 0.5-2.5 (3) MG/3ML IN SOLN
3.0000 mL | Freq: Four times a day (QID) | RESPIRATORY_TRACT | Status: DC
  Administered 2019-06-08 (×2): 3 mL via RESPIRATORY_TRACT
  Filled 2019-06-08 (×2): qty 3

## 2019-06-08 MED ORDER — ALBUTEROL SULFATE (2.5 MG/3ML) 0.083% IN NEBU: 2.5000 mg | INHALATION_SOLUTION | Freq: Four times a day (QID) | RESPIRATORY_TRACT | Status: DC | PRN

## 2019-06-08 MED ORDER — TRAMADOL HCL 50 MG PO TABS
50.0000 mg | ORAL_TABLET | Freq: Once | ORAL | Status: AC
  Administered 2019-06-08: 50 mg via ORAL
  Filled 2019-06-08: qty 1

## 2019-06-08 MED ORDER — ZOLPIDEM TARTRATE 5 MG PO TABS
5.0000 mg | ORAL_TABLET | Freq: Every evening | ORAL | Status: DC | PRN
  Administered 2019-06-08: 5 mg via ORAL
  Filled 2019-06-08: qty 1

## 2019-06-08 MED ORDER — VANCOMYCIN HCL 1.25 G IV SOLR
1250.0000 mg | INTRAVENOUS | Status: DC
  Filled 2019-06-08: qty 1250

## 2019-06-08 MED ORDER — SODIUM CHLORIDE 0.9 % IV SOLN
2.0000 g | INTRAVENOUS | Status: DC
  Administered 2019-06-08: 2 g via INTRAVENOUS
  Filled 2019-06-08: qty 20

## 2019-06-08 MED ORDER — WARFARIN SODIUM 2 MG PO TABS
3.0000 mg | ORAL_TABLET | Freq: Once | ORAL | Status: AC
  Administered 2019-06-08: 3 mg via ORAL
  Filled 2019-06-08: qty 1

## 2019-06-08 MED ORDER — IPRATROPIUM-ALBUTEROL 0.5-2.5 (3) MG/3ML IN SOLN
3.0000 mL | Freq: Two times a day (BID) | RESPIRATORY_TRACT | Status: DC
  Administered 2019-06-09: 3 mL via RESPIRATORY_TRACT
  Filled 2019-06-08: qty 3

## 2019-06-08 NOTE — Telephone Encounter (Signed)
RA Reviewed the download.  Per RA:  -OV in 4 weeks to assess and repeat download. -If centrals persist (current central from compliance report from 5/10-6/8 was 7.6), then go to BIPAP.  Pt is currently admitted at [facility]. When pt is discharged, will call pt to get him scheduled for a follow up appt.

## 2019-06-08 NOTE — Progress Notes (Signed)
*  PRELIMINARY RESULTS* Echocardiogram 2D Echocardiogram has been performed.  [name] 06/08/2019, 11:04 AM

## 2019-06-08 NOTE — Telephone Encounter (Signed)
From pt's last OV with Derl Barrow, 04/26/2019: Assessment & Plan:   OSA (obstructive sleep apnea) - Picked up HST today - Will call with results once completed  - He has had issues with mask fit in the past, may benefit from mask desensitization study in the future if needed   Pt completed HST 04/26/2019 and then had CPAP titration 06/03/2019.  Went to [employer] and was able to find compliance report from pt's CPAP which I have printed.  Will give Dr. Elsworth Soho the download for him to review. Routing back to Dr. Elsworth Soho.

## 2019-06-08 NOTE — Progress Notes (Signed)
PHARMACY - PHYSICIAN COMMUNICATION CRITICAL VALUE ALERT - BLOOD CULTURE IDENTIFICATION (BCID)  [name] is an 68 y.o. male who presented to [facility] on 06/07/2019 with a chief complaint of cellulitis.  Assessment:  6/8  BCx2:  GRAM NEGATIVE RODS  IN BOTH AEROBIC AND ANAEROBIC BOTTLES  Name of physician (or Provider) Contacted: Tat  Current antibiotics: cefepime 2g q8h  Changes to prescribed antibiotics recommended:  Patient is on recommended antibiotics - No changes needed    Despina Pole, Pharm. D. Clinical Pharmacist 06/08/2019 10:28 AM

## 2019-06-08 NOTE — Telephone Encounter (Signed)
I note that he is hospitalized currently at [facility] I reviewed his titration study He had central apneas emerged on CPAP and hence required BiPAP, eventually 16/12 cm. Has he been set up with CPAP already?  If so I would like to review his download before I suggest any changes.

## 2019-06-08 NOTE — Progress Notes (Signed)
Pharmacy Antibiotic Note  [name] is a 68 y.o. male admitted on 06/07/2019 with bacteremia / GNR sepsis.  Pharmacy has been consulted for cefepime dosing.  Plan: Cefepime 2 gm IV q8hr.  Pt reports allergy to cephalexin (rash) but tolerated cefepime yesterday without problems. Please monitor closely.  Height: 5\' 8"  (172.7 cm) Weight: 232 lb 2.3 oz (105.3 kg) IBW/kg (Calculated) : 68.4  Temp (24hrs), Avg:99.7 F (37.6 C), Min:98.4 F (36.9 C), Max:101.3 F (38.5 C)  Recent Labs  Lab 06/07/19 1216 06/07/19 1224 06/07/19 1358 06/07/19 1728 06/07/19 2000  WBC 18.6*  --   --   --   --   CREATININE 1.24  --   --   --   --   LATICACIDVEN  --  3.0* 3.1* 2.2* 2.5*    Estimated Creatinine Clearance: 67.1 mL/min (by C-G formula based on SCr of 1.24 mg/dL).    Allergies  Allergen Reactions  . Orencia [Abatacept] Anaphylaxis    Had prostate cancer  . Carbamazepine Rash    REACTION: makes drowsy BRAND NAME IS TEGRETOL  . Celecoxib Itching and Rash    BRAND NAME IS CELEBREX  . Cephalexin Rash    "Severe Rash".  BRAND NAME IS KEFLEX.   . Enbrel [Etanercept] Rash  . Humira [Adalimumab] Rash  . Levofloxacin Other (See Comments)    REACTION: GI Intolerance BRAND NAME IS LEVAQUIN  . Sulfa Antibiotics Rash  . Sulfasalazine Rash    Antimicrobials this admission: Cefepime 2gm once 6/8  Ciprofloxacin 400mg  IV q12h  6/8 >> 6/9    Microbiology results: 6/8 BCx: GNR--  pending   Thank you for allowing pharmacy to be a part of this patient's care.  Wyline Mood 06/08/2019 5:34 AM

## 2019-06-08 NOTE — Progress Notes (Addendum)
PROGRESS NOTE  ULMER DEGEN CBJ:[id number] DOB: 14-Nov-1951 DOA: 06/07/2019 PCP: [name], MD  Brief History:  68 year old male with a history rheumatoid arthritis, COPD, lower extremity DVT, hyperlipidemia, irritable bowel syndrome, seizure disorder, prostate cancer, OSA on CPAP presenting with 1 day history of generalized weakness, rigors with associated right lower extremity pain and worsening edema.  The patient has been in his usual state of health until the morning of 06/07/2019 when he developed the above symptoms.  He denies any recent injuries or trauma.  He denies any worsening shortness of breath, coughing, hemoptysis, nausea, vomiting, diarrhea, abdominal pain, dysuria.  Upon presentation, the patient was noted to have a temperature of 1 1.3 F with soft blood pressures.  WBC was 18.6.  The patient was started on vancomycin and cefepime for cellulitis.  Blood cultures have become positive with Serratia species.  Antibiotics were changed to ceftriaxone.  Assessment/Plan: Sepsis -Present at the time of admission -Secondary cellulitis and bacteremia -Lactic acid peaked 3.1 -Continue IV fluids -Personally reviewed chest x-ray--no infiltrates or edema  Serratia bacteremia -Discontinue vancomycin and cefepime -Start ceftriaxone 2 g daily pending final culture data -Source of infection is likely lower extremity cellulitis  COPD -Start duo nebs -Start Pulmicort  Rheumatoid arthritis -Holding Arava -Patient receives Actemra every 28 days -Patient follows Dr. Homero Fellers  Cellulitis right lower extremity -Right lower extremity duplex negative for DVT -Continue ceftriaxone -seems to be improving when comparing pictures from ED -see pictures below for today  History of lower extremity DVTs -Continue warfarin  Seizure disorder -continue phenytoin  CKD stage II -Baseline creatinine 1.1-1.2 -A.m. BMP  Hypophosphatemia -replete   Disposition Plan:   Home  in 1-2 days  Family Communication:  No Family at bedside  Consultants:  none  Code Status:  FULL   DVT Prophylaxis: warfarin   Procedures: As Listed in Progress Note Above  Antibiotics: Vanco/cefepime 6/8>>>6/9 Ceftriaxone 6/9>>>      Subjective: Patient denies fevers, chills, headache, chest pain, dyspnea, nausea, vomiting, diarrhea, abdominal pain, dysuria, hematuria, hematochezia, and melena. Pt states his right leg pain is a little better  Objective: Vitals:   06/08/19 0800 06/08/19 0900 06/08/19 1000 06/08/19 1100  BP:      Pulse: 85 81 84 85  Resp:    18  Temp:      TempSrc:      SpO2: (!) 89% 94% 96% 95%  Weight:      Height:        Intake/Output Summary (Last 24 hours) at 06/08/2019 1120 Last data filed at 06/08/2019 1016 Gross per 24 hour  Intake 2647.3 ml  Output -  Net 2647.3 ml   Weight change:  Exam:   General:  Pt is alert, follows commands appropriately, not in acute distress  HEENT: No icterus, No thrush, No neck mass, [place]/AT  Cardiovascular: RRR, S1/S2, no rubs, no gallops  Respiratory: CTA bilaterally, no wheezing, no crackles, no rhonchi  Abdomen: Soft/+BS, non tender, non distended, no guarding  Extremities: 2 + LE edema R>L  Right Leg     Data Reviewed: I have personally reviewed following labs and imaging studies Basic Metabolic Panel: Recent Labs  Lab 06/07/19 1216 06/08/19 0543  NA 142 139  K 3.9 3.7  CL 102 109  CO2 25 22  GLUCOSE 120* 112*  BUN 12 14  CREATININE 1.24 1.20  CALCIUM 9.5 8.2*  MG 1.7  --   PHOS 2.4*  --  Liver Function Tests: Recent Labs  Lab 06/07/19 1216 06/08/19 0543  AST 30 29  ALT 24 19  ALKPHOS 64 46  BILITOT 1.0 0.6  PROT 6.6 5.3*  ALBUMIN 4.2 3.2*   No results for input(s): LIPASE, AMYLASE in the last 168 hours. No results for input(s): AMMONIA in the last 168 hours. Coagulation Profile: Recent Labs  Lab 06/07/19 1216 06/08/19 0543  INR 2.4* 2.5*   CBC: Recent Labs   Lab 06/07/19 1216 06/08/19 0543  WBC 18.6* 18.1*  NEUTROABS 16.3* 16.3*  HGB 15.2 12.6*  HCT 47.4 39.5  MCV 93.9 94.3  PLT 160 130*   Cardiac Enzymes: No results for input(s): CKTOTAL, CKMB, CKMBINDEX, TROPONINI in the last 168 hours. BNP: Invalid input(s): POCBNP CBG: No results for input(s): GLUCAP in the last 168 hours. HbA1C: No results for input(s): HGBA1C in the last 72 hours. Urine analysis:    Component Value Date/Time   COLORURINE YELLOW 11/29/2018 1033   APPEARANCEUR HAZY (A) 11/29/2018 1033   LABSPEC 1.025 11/29/2018 1033   PHURINE 5.0 11/29/2018 1033   GLUCOSEU NEGATIVE 11/29/2018 1033   HGBUR MODERATE (A) 11/29/2018 1033   BILIRUBINUR NEGATIVE 11/29/2018 1033   KETONESUR NEGATIVE 11/29/2018 1033   PROTEINUR 30 (A) 11/29/2018 1033   UROBILINOGEN 0.2 09/24/2012 1640   NITRITE NEGATIVE 11/29/2018 1033   LEUKOCYTESUR NEGATIVE 11/29/2018 1033   Sepsis Labs: @LABRCNTIP (procalcitonin:4,lacticidven:4) ) Recent Results (from the past 240 hour(s))  SARS Coronavirus 2 (CEPHEID - Performed in [place] hospital lab), Hosp Order     Status: None   Collection Time: 06/02/19 10:00 AM  Result Value Ref Range Status   SARS Coronavirus 2 NEGATIVE NEGATIVE Final    Comment: (NOTE) If result is NEGATIVE SARS-CoV-2 target nucleic acids are NOT DETECTED. The SARS-CoV-2 RNA is generally detectable in upper and lower  respiratory specimens during the acute phase of infection. The lowest  concentration of SARS-CoV-2 viral copies this assay can detect is 250  copies / mL. A negative result does not preclude SARS-CoV-2 infection  and should not be used as the sole basis for treatment or other  patient management decisions.  A negative result may occur with  improper specimen collection / handling, submission of specimen other  than nasopharyngeal swab, presence of viral mutation(s) within the  areas targeted by this assay, and inadequate number of viral copies  (<250 copies  / mL). A negative result must be combined with clinical  observations, patient history, and epidemiological information. If result is POSITIVE SARS-CoV-2 target nucleic acids are DETECTED. The SARS-CoV-2 RNA is generally detectable in upper and lower  respiratory specimens dur ing the acute phase of infection.  Positive  results are indicative of active infection with SARS-CoV-2.  Clinical  correlation with patient history and other diagnostic information is  necessary to determine patient infection status.  Positive results do  not rule out bacterial infection or co-infection with other viruses. If result is PRESUMPTIVE POSTIVE SARS-CoV-2 nucleic acids MAY BE PRESENT.   A presumptive positive result was obtained on the submitted specimen  and confirmed on repeat testing.  While 2019 novel coronavirus  (SARS-CoV-2) nucleic acids may be present in the submitted sample  additional confirmatory testing may be necessary for epidemiological  and / or clinical management purposes  to differentiate between  SARS-CoV-2 and other Sarbecovirus currently known to infect humans.  If clinically indicated additional testing with an alternate test  methodology [phone]) is advised. The SARS-CoV-2 RNA is generally  detectable in  upper and lower respiratory sp ecimens during the acute  phase of infection. The expected result is Negative. Fact Sheet for Patients:  StrictlyIdeas.no Fact Sheet for Healthcare Providers: BankingDealers.co.za This test is not yet approved or cleared by the [place] FDA and has been authorized for detection and/or diagnosis of SARS-CoV-2 by FDA under an Emergency Use Authorization (EUA).  This EUA will remain in effect (meaning this test can be used) for the duration of the COVID-19 declaration under Section 564(b)(1) of the Act, 21 U.S.C. section 360bbb-3(b)(1), unless the authorization is terminated or revoked sooner.  Performed at [facility], [address] [place], [place] [address]   Culture, blood (Routine x 2)     Status: None (Preliminary result)   Collection Time: 06/07/19 12:16 PM  Result Value Ref Range Status   Specimen Description   Final    BLOOD LEFT ARM Performed at [facility], [address], [place], [place] [address]    Special Requests   Final    BOTTLES DRAWN AEROBIC AND ANAEROBIC Blood Culture adequate volume Performed at [facility], [address], [place], [place] [address]    Culture  Setup Time   Final    GRAM NEGATIVE RODS IN BOTH AEROBIC AND ANAEROBIC BOTTLES Gram Stain Report Called to,Read Back By and Verified With: R Dixie Dials @0431  06/08/19 MKELLY Organism ID to follow Performed at [facility], [place] [address], [place], [place] [address]    Culture PENDING  Incomplete   Report Status PENDING  Incomplete  Blood Culture ID Panel (Reflexed)     Status: Abnormal   Collection Time: 06/07/19 12:16 PM  Result Value Ref Range Status   Enterococcus species NOT DETECTED NOT DETECTED Final   Listeria monocytogenes NOT DETECTED NOT DETECTED Final   Staphylococcus species NOT DETECTED NOT DETECTED Final   Staphylococcus aureus (BCID) NOT DETECTED NOT DETECTED Final   Streptococcus species NOT DETECTED NOT DETECTED Final   Streptococcus agalactiae NOT DETECTED NOT DETECTED Final   Streptococcus pneumoniae NOT DETECTED NOT DETECTED Final   Streptococcus pyogenes NOT DETECTED NOT DETECTED Final   Acinetobacter baumannii NOT DETECTED NOT DETECTED Final   Enterobacteriaceae species DETECTED (A) NOT DETECTED Final    Comment: Enterobacteriaceae represent a large family of gram-negative bacteria, not a single organism. CRITICAL RESULT CALLED TO, READ BACK BY AND VERIFIED WITH: G. Coffee PharmD 10:45 06/08/19 ([email or web address])    Enterobacter cloacae complex NOT DETECTED NOT DETECTED Final   Escherichia coli NOT DETECTED NOT DETECTED Final   Klebsiella oxytoca NOT DETECTED NOT  DETECTED Final   Klebsiella pneumoniae NOT DETECTED NOT DETECTED Final   Proteus species NOT DETECTED NOT DETECTED Final   Serratia marcescens DETECTED (A) NOT DETECTED Final    Comment: CRITICAL RESULT CALLED TO, READ BACK BY AND VERIFIED WITH: G. Coffee PharmD 10:45 06/08/19 ([email or web address])    Carbapenem resistance NOT DETECTED NOT DETECTED Final   Haemophilus influenzae NOT DETECTED NOT DETECTED Final   Neisseria meningitidis NOT DETECTED NOT DETECTED Final   Pseudomonas aeruginosa NOT DETECTED NOT DETECTED Final   Candida albicans NOT DETECTED NOT DETECTED Final   Candida glabrata NOT DETECTED NOT DETECTED Final   Candida krusei NOT DETECTED NOT DETECTED Final   Candida parapsilosis NOT DETECTED NOT DETECTED Final   Candida tropicalis NOT DETECTED NOT DETECTED Final    Comment: Performed at [facility], [place] [address], [place],  [address]  Culture, blood (Routine x 2)     Status: None (Preliminary result)  Collection Time: 06/07/19 12:24 PM  Result Value Ref Range Status   Specimen Description BLOOD RIGHT ARM  Final   Special Requests   Final    BOTTLES DRAWN AEROBIC AND ANAEROBIC Blood Culture adequate volume   Culture   Final    NO GROWTH < 24 HOURS Performed at [facility], [address], [place], [place] [address]    Report Status PENDING  Incomplete  SARS Coronavirus 2 (CEPHEID - Performed in [place] hospital lab), Hosp Order     Status: None   Collection Time: 06/07/19  1:16 PM  Result Value Ref Range Status   SARS Coronavirus 2 NEGATIVE NEGATIVE Final    Comment: (NOTE) If result is NEGATIVE SARS-CoV-2 target nucleic acids are NOT DETECTED. The SARS-CoV-2 RNA is generally detectable in upper and lower  respiratory specimens during the acute phase of infection. The lowest  concentration of SARS-CoV-2 viral copies this assay can detect is 250  copies / mL. A negative result does not preclude SARS-CoV-2 infection  and should not be used as the sole basis for  treatment or other  patient management decisions.  A negative result may occur with  improper specimen collection / handling, submission of specimen other  than nasopharyngeal swab, presence of viral mutation(s) within the  areas targeted by this assay, and inadequate number of viral copies  (<250 copies / mL). A negative result must be combined with clinical  observations, patient history, and epidemiological information. If result is POSITIVE SARS-CoV-2 target nucleic acids are DETECTED. The SARS-CoV-2 RNA is generally detectable in upper and lower  respiratory specimens dur ing the acute phase of infection.  Positive  results are indicative of active infection with SARS-CoV-2.  Clinical  correlation with patient history and other diagnostic information is  necessary to determine patient infection status.  Positive results do  not rule out bacterial infection or co-infection with other viruses. If result is PRESUMPTIVE POSTIVE SARS-CoV-2 nucleic acids MAY BE PRESENT.   A presumptive positive result was obtained on the submitted specimen  and confirmed on repeat testing.  While 2019 novel coronavirus  (SARS-CoV-2) nucleic acids may be present in the submitted sample  additional confirmatory testing may be necessary for epidemiological  and / or clinical management purposes  to differentiate between  SARS-CoV-2 and other Sarbecovirus currently known to infect humans.  If clinically indicated additional testing with an alternate test  methodology [phone]) is advised. The SARS-CoV-2 RNA is generally  detectable in upper and lower respiratory sp ecimens during the acute  phase of infection. The expected result is Negative. Fact Sheet for Patients:  StrictlyIdeas.no Fact Sheet for Healthcare Providers: BankingDealers.co.za This test is not yet approved or cleared by the [place] FDA and has been authorized for detection and/or  diagnosis of SARS-CoV-2 by FDA under an Emergency Use Authorization (EUA).  This EUA will remain in effect (meaning this test can be used) for the duration of the COVID-19 declaration under Section 564(b)(1) of the Act, 21 U.S.C. section 360bbb-3(b)(1), unless the authorization is terminated or revoked sooner. Performed at [facility], [address] [place], [place] [address]   MRSA PCR Screening     Status: None   Collection Time: 06/07/19 10:01 PM  Result Value Ref Range Status   MRSA by PCR NEGATIVE NEGATIVE Final    Comment:        The GeneXpert MRSA Assay (FDA approved for NASAL specimens only), is one component of a comprehensive MRSA colonization surveillance program.  It is not intended to diagnose MRSA infection nor to guide or monitor treatment for MRSA infections. Performed at [facility], [address] [place], [place] [address]      Scheduled Meds: . albuterol  2 mg Oral Daily  . allopurinol  100 mg Oral BID  . dicyclomine  20 mg Oral BID  . fesoterodine  8 mg Oral Daily  . fluticasone furoate-vilanterol  1 puff Inhalation QHS  . pantoprazole  40 mg Oral Daily  . phenytoin  100 mg Oral Daily   And  . phenytoin  200 mg Oral QHS  . phosphorus  500 mg Oral BID  . sodium chloride flush  3 mL Intravenous Once  . umeclidinium bromide  1 puff Inhalation q morning - 10a  . warfarin  3 mg Oral Once  . Warfarin - Pharmacist Dosing Inpatient   Does not apply q1800   Continuous Infusions: . cefTRIAXone (ROCEPHIN)  IV      Procedures/Studies: Dg Chest 2 View  Result Date: 06/07/2019 CLINICAL DATA:  Shortness of breath and fever EXAM: CHEST - 2 VIEW COMPARISON:  07/05/2017 FINDINGS: There is elevation of the left diaphragm unchanged from the prior exams. There is no focal consolidation. There is no pleural effusion or pneumothorax. The heart and mediastinal contours are unremarkable. There is thoracic aortic atherosclerosis. The osseous structures are unremarkable.  IMPRESSION: No active cardiopulmonary disease. Electronically Signed   By: Kathreen Devoid   On: 06/07/2019 13:17   US Venous Img Lower Right (dvt Study)  Result Date: 06/07/2019 CLINICAL DATA:  Pain and edema EXAM: RIGHT LOWER EXTREMITY VENOUS DOPPLER ULTRASOUND TECHNIQUE: Gray-scale sonography with compression, as well as color and duplex ultrasound, were performed to evaluate the deep venous system from the level of the common femoral vein through the popliteal and proximal calf veins. COMPARISON:  03/15/2016 FINDINGS: Normal compressibility of the right common femoral vein, visualized profunda femoral vein, and saphenofemoral junction. There is wall thickening in relatively diminutive femoral and popliteal veins, incompletely compressible, with persistent flow signal identified on color Doppler. Monophasic waveforms. Small changes in the visualized calf veins, with flow signal on color Doppler. No definite acute DVT. Survey views of the contralateral left common femoral vein are unremarkable. IMPRESSION: A 1. Extensive post thrombotic change in the RIGHT femoral and popliteal veins and visualized calf veins. No definite acute DVT. For Electronically Signed   By: Lucrezia Europe M.D.   On: 06/07/2019 15:02    Orson Eva, DO  [facility] Pager [phone]  If 7PM-7AM, please contact night-coverage [email or web address] Password [email or web address] 06/08/2019, 11:20 AM   LOS: 1 day

## 2019-06-08 NOTE — Procedures (Signed)
Patient Name: [name], [name] Date: 06/03/2019 Gender: Male D.O.B: August 25, 1951 Age (years): 32 Referring Provider: Kara Mead MD, ABSM Height (inches): 68 Interpreting Physician: Kara Mead MD, ABSM Weight (lbs): 229 RPSGT: Peak, Robert BMI: 35 MRN: [id number] Neck Size: 16.50 <br> <br> CLINICAL INFORMATION The patient is referred for a BiPAP titration to treat sleep apnea.    Date of  HST: 03/2019 , severe OSA with AHI 38/h  SLEEP STUDY TECHNIQUE As per the AASM Manual for the Scoring of Sleep and Associated Events v2.3 (April 2016) with a hypopnea requiring 4% desaturations.  The channels recorded and monitored were frontal, central and occipital EEG, electrooculogram (EOG), submentalis EMG (chin), nasal and oral airflow, thoracic and abdominal wall motion, anterior tibialis EMG, snore microphone, electrocardiogram, and pulse oximetry. Bilevel positive airway pressure (BPAP) was initiated at the beginning of the study and titrated to treat sleep-disordered breathing.  MEDICATIONS Medications self-administered by patient taken the night of the study : N/A  RESPIRATORY PARAMETERS Optimal IPAP Pressure (cm): 16 cm AHI at Optimal Pressure (/hr) 31 Optimal EPAP Pressure (cm): 12 cm   Overall Minimal O2 (%): 80.0 Minimal O2 at Optimal Pressure (%): 80.0 SLEEP ARCHITECTURE Start Time: 10:22:37 PM Stop Time: 5:23:43 AM Total Time (min): 421.1 Total Sleep Time (min): 281 Sleep Latency (min): 34.1 Sleep Efficiency (%): 66.7% REM Latency (min): 157.0 WASO (min): 106.0 Stage N1 (%): 13.3% Stage N2 (%): 62.8% Stage N3 (%): 6.0% Stage R (%): 17.8 Supine (%): 2.14 Arousal Index (/hr): 44.4     CARDIAC DATA The 2 lead EKG demonstrated sinus rhythm. The mean heart rate was 68.4 beats per minute. Other EKG findings include: PVCs.   LEG MOVEMENT DATA The total Periodic Limb Movements of Sleep (PLMS) were 0. The PLMS index was 0.0. A PLMS index of <15 is considered normal in  adults.  IMPRESSIONS - An optimal PAP pressure could not be selected for this patient based on the available study data. Central apneas emerged and persisted on CPAP and hence BiPAP was titrated to 16/12 - Moderate Central Sleep Apnea was noted during this titration (CAI = 25.8/h). - Severe oxygen desaturations were observed during this titration (min O2 = 80.0%). - No snoring was audible during this study. - 2-lead EKG demonstrated: PVCs - Clinically significant periodic limb movements were not noted during this study. Arousals associated with PLMs were rare.   DIAGNOSIS - Obstructive Sleep Apnea (327.23 [G47.33 ICD-10]) - Complex sleep apnea   RECOMMENDATIONS - Recommend a trial of Auto-BiPAP 6 - 18 cm H2O with medium full face mask - Avoid alcohol, sedatives and other CNS depressants that may worsen sleep apnea and disrupt normal sleep architecture. - Sleep hygiene should be reviewed to assess factors that may improve sleep quality. - Weight management and regular exercise should be initiated or continued. - Return to Sleep Center for re-evaluation after 4 weeks of therapy  Kara Mead MD Board Certified in [place]

## 2019-06-08 NOTE — Progress Notes (Signed)
ANTICOAGULATION CONSULT NOTE - Initial Consult  Pharmacy Consult for Coumadin Indication: DVT  Allergies  Allergen Reactions  . Orencia [Abatacept] Anaphylaxis    Had prostate cancer  . Carbamazepine Rash    REACTION: makes drowsy BRAND NAME IS TEGRETOL  . Celecoxib Itching and Rash    BRAND NAME IS CELEBREX  . Cephalexin Rash    "Severe Rash".  BRAND NAME IS KEFLEX.   . Enbrel [Etanercept] Rash  . Humira [Adalimumab] Rash  . Levofloxacin Other (See Comments)    REACTION: GI Intolerance BRAND NAME IS LEVAQUIN  . Sulfa Antibiotics Rash  . Sulfasalazine Rash    Patient Measurements: Height: 5\' 8"  (172.7 cm) Weight: 232 lb 2.3 oz (105.3 kg) IBW/kg (Calculated) : 68.4  Vital Signs: Temp: 97.9 F (36.6 C) (06/09 0400) Temp Source: Axillary (06/09 0400) BP: 114/69 (06/09 0500) Pulse Rate: 78 (06/09 0500)  Labs: Recent Labs    06/07/19 1216 06/08/19 0543  HGB 15.2 12.6*  HCT 47.4 39.5  PLT 160 130*  LABPROT 25.8* 26.6*  INR 2.4* 2.5*  CREATININE 1.24 1.20    Estimated Creatinine Clearance: 69.3 mL/min (by C-G formula based on SCr of 1.2 mg/dL).   Medications:  See med rec  Assessment: 68 yo male presented to ED with fever and swollen Right leg. He is on chronic anticoagulation with Coumadin for previous DVT. INR is therapeutic at 2.4.  Home dose is 3mg  HS  Goal of Therapy:  INR 2-3 Monitor platelets by anticoagulation protocol: Yes   Plan:  Repeat home dose of Coumadin 3mg  po x 1 today for INR 2.5 Daily PT-INR Monitor for S/S of bleeding   Despina Pole, Pharm. D. Clinical Pharmacist 06/08/2019 10:37 AM

## 2019-06-08 NOTE — Progress Notes (Signed)
Blood culture bottles both come back positive for gram negative rods. MD notified via text page

## 2019-06-09 ENCOUNTER — Ambulatory Visit: Payer: Medicare Other | Admitting: Pulmonary Disease

## 2019-06-09 DIAGNOSIS — I5032 Chronic diastolic (congestive) heart failure: Secondary | ICD-10-CM

## 2019-06-09 DIAGNOSIS — G4733 Obstructive sleep apnea (adult) (pediatric): Secondary | ICD-10-CM

## 2019-06-09 DIAGNOSIS — Z86718 Personal history of other venous thrombosis and embolism: Secondary | ICD-10-CM

## 2019-06-09 DIAGNOSIS — K219 Gastro-esophageal reflux disease without esophagitis: Secondary | ICD-10-CM

## 2019-06-09 DIAGNOSIS — M1A09X0 Idiopathic chronic gout, multiple sites, without tophus (tophi): Secondary | ICD-10-CM

## 2019-06-09 DIAGNOSIS — Z6835 Body mass index (BMI) 35.0-35.9, adult: Secondary | ICD-10-CM

## 2019-06-09 DIAGNOSIS — Z8546 Personal history of malignant neoplasm of prostate: Secondary | ICD-10-CM

## 2019-06-09 DIAGNOSIS — J449 Chronic obstructive pulmonary disease, unspecified: Secondary | ICD-10-CM

## 2019-06-09 LAB — CBC WITH DIFFERENTIAL/PLATELET
Abs Immature Granulocytes: 0.06 10*3/uL (ref 0.00–0.07)
Basophils Absolute: 0.1 10*3/uL (ref 0.0–0.1)
Basophils Relative: 0 %
Eosinophils Absolute: 0.8 10*3/uL — ABNORMAL HIGH (ref 0.0–0.5)
Eosinophils Relative: 7 %
HCT: 38.7 % — ABNORMAL LOW (ref 39.0–52.0)
Hemoglobin: 12.5 g/dL — ABNORMAL LOW (ref 13.0–17.0)
Immature Granulocytes: 1 %
Lymphocytes Relative: 7 %
Lymphs Abs: 0.8 10*3/uL (ref 0.7–4.0)
MCH: 30.1 pg (ref 26.0–34.0)
MCHC: 32.3 g/dL (ref 30.0–36.0)
MCV: 93.3 fL (ref 80.0–100.0)
Monocytes Absolute: 0.8 10*3/uL (ref 0.1–1.0)
Monocytes Relative: 8 %
Neutro Abs: 8.7 10*3/uL — ABNORMAL HIGH (ref 1.7–7.7)
Neutrophils Relative %: 77 %
Platelets: 124 10*3/uL — ABNORMAL LOW (ref 150–400)
RBC: 4.15 MIL/uL — ABNORMAL LOW (ref 4.22–5.81)
RDW: 14.5 % (ref 11.5–15.5)
WBC: 11.3 10*3/uL — ABNORMAL HIGH (ref 4.0–10.5)
nRBC: 0 % (ref 0.0–0.2)

## 2019-06-09 LAB — COMPREHENSIVE METABOLIC PANEL
ALT: 20 U/L (ref 0–44)
AST: 29 U/L (ref 15–41)
Albumin: 3.3 g/dL — ABNORMAL LOW (ref 3.5–5.0)
Alkaline Phosphatase: 51 U/L (ref 38–126)
Anion gap: 8 (ref 5–15)
BUN: 13 mg/dL (ref 8–23)
CO2: 25 mmol/L (ref 22–32)
Calcium: 8.6 mg/dL — ABNORMAL LOW (ref 8.9–10.3)
Chloride: 107 mmol/L (ref 98–111)
Creatinine, Ser: 1.1 mg/dL (ref 0.61–1.24)
GFR calc Af Amer: >60 mL/min (ref >60)
GFR calc non Af Amer: >60 mL/min (ref >60)
Glucose, Bld: 108 mg/dL — ABNORMAL HIGH (ref 70–99)
Potassium: 3.5 mmol/L (ref 3.5–5.1)
Sodium: 140 mmol/L (ref 135–145)
Total Bilirubin: 0.5 mg/dL (ref 0.3–1.2)
Total Protein: 5.6 g/dL — ABNORMAL LOW (ref 6.5–8.1)

## 2019-06-09 LAB — HIV ANTIBODY (ROUTINE TESTING W REFLEX): HIV Screen 4th Generation wRfx: NONREACTIVE

## 2019-06-09 LAB — PROTIME-INR
INR: 2.3 — ABNORMAL HIGH (ref 0.8–1.2)
Prothrombin Time: 24.7 seconds — ABNORMAL HIGH (ref 11.4–15.2)

## 2019-06-09 MED ORDER — SACCHAROMYCES BOULARDII 250 MG PO CAPS: 250.0000 mg | ORAL_CAPSULE | Freq: Two times a day (BID) | ORAL | 0 refills | Status: DC

## 2019-06-09 MED ORDER — CIPROFLOXACIN HCL 500 MG PO TABS: 500.0000 mg | ORAL_TABLET | Freq: Two times a day (BID) | ORAL | 0 refills | Status: AC

## 2019-06-09 MED ORDER — LEFLUNOMIDE 20 MG PO TABS: 20.0000 mg | ORAL_TABLET | Freq: Every day | ORAL | Status: DC

## 2019-06-09 MED ORDER — WARFARIN SODIUM 2 MG PO TABS: 3.0000 mg | ORAL_TABLET | Freq: Once | ORAL | Status: DC

## 2019-06-09 NOTE — TOC Transition Note (Signed)
Transition of Care [facility]) - CM/SW Discharge Note   Patient Details  Name: [name] MRN: [id number] Date of Birth: 1951-02-10  Transition of Care [facility]) CM/SW Contact:  Gust Eugene, Chauncey Reading, RN Phone Number: 06/09/2019, 9:59 AM   Clinical Narrative:   Cellulitis. Patient discharging today. F/u appt scheduled. No other TOC needs.   Expected Discharge Plan: Home/Self Care Barriers to Discharge: No Barriers Identified  Expected Discharge Plan and Services Expected Discharge Plan: Home/Self Care         Expected Discharge Date: 06/09/19                                      Activities of Daily Living Home Assistive Devices/Equipment: Nebulizer, CPAP ADL Screening (condition at time of admission) Patient's cognitive ability adequate to safely complete daily activities?: Yes Is the patient deaf or have difficulty hearing?: No Does the patient have difficulty seeing, even when wearing glasses/contacts?: No Does the patient have difficulty concentrating, remembering, or making decisions?: No Patient able to express need for assistance with ADLs?: Yes Does the patient have difficulty dressing or bathing?: No Independently performs ADLs?: Yes (appropriate for developmental age) Does the patient have difficulty walking or climbing stairs?: No Weakness of Legs: None Weakness of Arms/Hands: None  Admission diagnosis:  Sepsis, due to unspecified organism, unspecified whether acute organ dysfunction present [facility]) [A41.9] Patient Active Problem List   Diagnosis Date Noted  . Class 2 severe obesity due to excess calories with serious comorbidity and body mass index (BMI) of 35.0 to 35.9 in adult [facility])   . Chronic diastolic HF (heart failure) ([place])   . Serratia sepsis ([place]) 06/08/2019  . Bacteremia due to Gram-negative bacteria 06/08/2019  . Sepsis due to cellulitis ([place]) 06/07/2019  . Voice hoarseness 04/26/2019  . OSA (obstructive sleep apnea) 03/10/2019  . Abnormal  CT of the chest 02/09/2019  . Dyspnea on exertion 10/22/2017  . History of seizure disorder 02/27/2017  . Primary osteoarthritis of both hands 02/27/2017  . Primary osteoarthritis of both feet 02/27/2017  . Idiopathic gout of multiple sites 02/27/2017  . High risk medication use 12/29/2016  . History of prostate cancer 12/29/2016  . Primary osteoarthritis of both knees 12/29/2016  . Chronic idiopathic gout involving toe without tophus 12/29/2016  . History of CHF (congestive heart failure) 12/29/2016  . History of COPD 12/29/2016  . Plantar pustular psoriasis 12/29/2016  . DVT (deep venous thrombosis) ([place]) 10/31/2016  . Chronic obstructive pulmonary disease ([place]) 10/31/2016  . CHF (congestive heart failure) ([place]) 10/31/2016  . Prostate cancer ([place]) 08/30/2015  . Malignant neoplasm of prostate ([place]) 07/04/2015  . Chest pain at rest 07/05/2014  . Weakness 07/05/2014  . Complicated postphlebitic syndrome 11/16/2013  . Personal history of DVT (deep vein thrombosis) 05/18/2013  . Chronic anticoagulation 05/18/2013  . Diverticulosis of colon without hemorrhage 05/18/2013  . Rheumatoid arthritis ([place]) 05/18/2013  . Seizure disorder ([place]) 05/18/2013  . Hx of adenomatous colonic polyps 05/18/2013  . GERD 05/08/2010  . NAUSEA 05/08/2010  . FLATULENCE-GAS-BLOATING 05/08/2010   PCP:  [name], MD Pharmacy:   [id number] [place], [place] [address] [place] [address] Phone: [phone] Fax: [phone]  [facility] [place] [place] [facility] [employer] [facility] [place] [address]  Phone: [phone] Fax: [phone]

## 2019-06-09 NOTE — Discharge Summary (Signed)
Physician Discharge Summary  [name] LPF:[id number] DOB: [birth year] DOA: 06/07/2019  PCP: [name], MD  Admit date: 06/07/2019 Discharge date: 06/09/2019  Time spent: 35 minutes  Recommendations for Outpatient Follow-up:  1. Repeat BMET to follow electrolytes and renal function  2. Assess complete resolution of RLE cellulitis 3. Close follow up to INR while on abx's   Discharge Diagnoses:  Principal Problem:   Sepsis due to cellulitis [facility]) Active Problems:   GERD   Personal history of DVT (deep vein thrombosis)   Rheumatoid arthritis (HCC)   Chronic obstructive pulmonary disease (HCC)   History of prostate cancer   History of CHF (congestive heart failure)   Idiopathic gout of multiple sites   OSA (obstructive sleep apnea)   Serratia sepsis (HCC)   Bacteremia due to Gram-negative bacteria   Class 2 severe obesity due to excess calories with serious comorbidity and body mass index (BMI) of 35.0 to 35.9 in adult [facility])   Chronic diastolic HF (heart failure) ([place])   Discharge Condition: stable and improved. Discharge home with instructions to follow up with PCP in 10 days.   Diet recommendation: heart healthy and low calorie diet.   Filed Weights   06/07/19 2219 06/08/19 0500 06/09/19 0500  Weight: 105.3 kg 105.3 kg 105.8 kg    History of present illness:  As per H&P written by Dr. Olevia Bowens on 06/07/19 68 y.o. male with medical history significant of history of anxiety, osteoarthritis, history of rheumatoid Tritus, asthma/COPD, history of DVT of both lower extremities, diverticulosis, GERD, gout, history of heart murmur, measles, shingles, hyperlipidemia, IBS, history of peripheral edema, history of pneumonia, history of prostate cancer, seizure disorder with last episode over 20 years ago sleep apnea on home CPAP who is coming to the emergency department with complaints of waking up with decreased appetite, nausea, malaise, developed pain fever, pain, erythema, calor and  edema over his right lower extremities.  He notices that he has some chronic edema and some dermatitis changes.  He denies trauma to the area, but mentions he has chronic inflammation of the area.  No previous episodes of cellulitis per patient.  He denies dyspnea, wheezing, hemoptysis, chest pain, palpitations, diaphoresis, abdominal pain, diarrhea, constipation, melena or hematochezia.  No dysuria, frequency or hematuria.  Denies polyuria, polydipsia, polyphagia or blurred vision.  ED Course: Initial vital signs were temperature 101.3 F, pulse 107, respirations 24, blood pressure 179/76 mmHg and O2 sat 94% on room air.  The patient received a 1000 mL NS bolus, cefepime and vancomycin in the emergency department.  Patient has a history of allergy to Keflex, but did not have a reaction to cefepime so far.  Antibiotic coverage was switched to ciprofloxacin per pharmacy suggestion.  White count was 18.6 with 89% neutrophils, hemoglobin 15.2 g/dL and platelets 160.  PT was 25.8 seconds and INR 2.4.  His CMP shows a glucose of 120 mg/dL but is otherwise within normal limits.  Lactic acid has been 3.0 and 3.1 mmol/L.  BNP was 38.0 pg milliliter.  Magnesium relatively low at 1.7 and phosphorus 2.4 mg/dL. His chest radiograph does not show any active cardiopulmonary disease.  Lower extremity venous Doppler showed extensive post thrombotic change in the right femoral and popliteal veins and visualized calf veins, but there was no definite acute DVT.  Please see images and full radiology report for further detail.  Hospital Course:  Sepsis -Present at the time of admission -Secondary to cellulitis and bacteremia -Lactic acid peaked 3.1 -advise  to maintain adequate hydration and to complete antibiotic therapy as prescribed. -follow up with PCP in 10 days.  Serratia bacteremia -Discontinue vancomycin and cefepime -treated with ceftriaxone 2 g daily for 48 hours; following IV recommendations will discharge  on ciprofloxacin for another 12 days. -follow final culture/sensitivity results.  -Source of infection is likely lower extremity cellulitis  Cellulitis right lower extremity -Right lower extremity duplex negative for DVT -treated with IV ceftriaxone and per ID rec's discharge on ciprofloxacin with anticipated treatment to complete 14 days total. 12 more days pending at discharge.  -seems to be improving appropriately and sepsis features essentially resolved.   COPD/OSA -no wheezing, no SOB and good O2 sat on RA -resume home inhaler regimen -continue outpatient follow up with Dr. Elsworth Soho -continue use of CPAP nightly   Class 2 obesity -Body mass index is 35.47 kg/m. -low calorie diet, portion control and increase physical activity discussed with patient.   Rheumatoid arthritis -Holding [place] for another 7 days at discharge. -Patient receives Actemra every 28 days -Patient follows Dr. Homero Fellers  GERD -continue PPI  Chronic diastolic HF -compensated -continue current diuretic regimen at discharge -follow low sodium diet, daily weights and outpatient follow up with cardiology service.   History of lower extremity DVTs -Continue warfarin -INR stable, will need close follow up while using antibiotics.   Seizure disorder -continue phenytoin -no seizure disorder appreciated.   CKD stage II -Baseline creatinine 1.1-1.2 -stable -repeat BMET at follow up visit to follow electrolytes and renal function stability   Hypophosphatemia -repleted  Procedures:  See below for x-ray reports   Consultations:  ID (Dr. Linus Salmons consulted by phone)  Discharge Exam: Vitals:   06/09/19 0500 06/09/19 0723  BP: (!) 130/115   Pulse: 79 86  Resp: 20 (!) 23  Temp:  99.1 F (37.3 C)  SpO2: 96% 98%    General: afebrile, no CP, no SOB, no nausea, no vomiting. Patient eager to go home and just complaining of mild pain and swelling on his RLE. Cardiovascular: S1 and S2, no rubs, no  gallops  Respiratory: good air movement bilaterally, no wheezing, no crackles  Abdomen: obese, soft, NT, ND, positive BS Extremities: no cyanosis, no clubbing. Patient RLE with 1-2+ edema and changes of chronic stasis dermatitis. No purulent discharge appreciated.    Discharge Instructions   Discharge Instructions    Diet - low sodium heart healthy   Complete by:  As directed    Discharge instructions   Complete by:  As directed    Take medications as prescribed  Keep yourself well hydrated  Follow low sodium diet and check your weight on daily basis. Please arrange follow up with PCP in 10 days Maintain legs elevated as much as possible     Allergies as of 06/09/2019      Reactions   Orencia [abatacept] Anaphylaxis   Had prostate cancer   Carbamazepine Rash   REACTION: makes drowsy BRAND NAME IS TEGRETOL   Celecoxib Itching, Rash   BRAND NAME IS CELEBREX   Cephalexin Rash   "Severe Rash".  BRAND NAME IS KEFLEX.    Enbrel [etanercept] Rash   Humira [adalimumab] Rash   Levofloxacin Other (See Comments)   REACTION: GI Intolerance BRAND NAME IS LEVAQUIN   Sulfa Antibiotics Rash   Sulfasalazine Rash      Medication List    TAKE these medications   acetaminophen 500 MG tablet Commonly known as:  TYLENOL Take 1,000 mg by mouth every 6 (six) hours as  needed (arthritis pain).   ACTEMRA IV Inject into the vein every 28 (twenty-eight) days. For Rheumatoid Arthritis   albuterol 2 MG tablet Commonly known as:  PROVENTIL Take 2 mg by mouth daily.   albuterol (2.5 MG/3ML) 0.083% nebulizer solution Commonly known as:  PROVENTIL Take 3 mLs (2.5 mg total) by nebulization every 6 (six) hours as needed for wheezing or shortness of breath.   alendronate 70 MG tablet Commonly known as:  FOSAMAX TAKE 1 TABLET BY MOUTH ONCE A WEEK. TAKE WITH FULL GLASS OF WATER ON EMPTY STOMACH What changed:  See the new instructions.   allopurinol 100 MG tablet Commonly known as:   ZYLOPRIM TAKE 1 TABLET BY MOUTH TWICE A DAY   Breo Ellipta 100-25 MCG/INH Aepb Generic drug:  fluticasone furoate-vilanterol INHALE 1 PUFF BY MOUTH EVERY DAY What changed:  See the new instructions.   ciprofloxacin 500 MG tablet Commonly known as:  Cipro Take 1 tablet (500 mg total) by mouth 2 (two) times daily for 12 days.   CITRACAL +D3 PO Take 2 tablets by mouth 2 (two) times daily.   diclofenac sodium 1 % Gel Commonly known as:  VOLTAREN Apply 3 grams to 3 large joints up to 3 times daily What changed:    how much to take  how to take this  when to take this  reasons to take this   dicyclomine 10 MG capsule Commonly known as:  BENTYL TAKE 1 CAPSULE BY MOUTH 4 TIMES DAILY BEFORE MEALS AND AT BEDTIME What changed:  See the new instructions.   Dilantin 100 MG ER capsule Generic drug:  phenytoin Take 100-200 mg by mouth See admin instructions. Take one capsule in the morning and 2 capsules at bedtime   folic acid 1 MG tablet Commonly known as:  FOLVITE TAKE 2 TABLETS BY MOUTH EVERY DAY IN THE MORNING What changed:  See the new instructions.   furosemide 40 MG tablet Commonly known as:  LASIX Take 1 tablet (40 mg total) by mouth 2 (two) times daily.   Incruse Ellipta 62.5 MCG/INH Aepb Generic drug:  umeclidinium bromide TAKE 1 PUFF BY MOUTH EVERY DAY What changed:  See the new instructions.   leflunomide 20 MG tablet Commonly known as:  ARAVA Take 1 tablet (20 mg total) by mouth daily. Hold for 7 days and then resume. What changed:  additional instructions   omeprazole 40 MG capsule Commonly known as:  PRILOSEC Take 40 mg by mouth at bedtime.   Potassium Chloride ER 20 MEQ Tbcr Take 20 mEq by mouth daily.   saccharomyces boulardii 250 MG capsule Commonly known as:  Florastor Take 1 capsule (250 mg total) by mouth 2 (two) times daily.   Toviaz 8 MG Tb24 tablet Generic drug:  fesoterodine Take 8 mg by mouth daily.   warfarin 3 MG tablet Commonly  known as:  COUMADIN Take 3 mg by mouth at bedtime.      Allergies  Allergen Reactions  . Orencia [Abatacept] Anaphylaxis    Had prostate cancer  . Carbamazepine Rash    REACTION: makes drowsy BRAND NAME IS TEGRETOL  . Celecoxib Itching and Rash    BRAND NAME IS CELEBREX  . Cephalexin Rash    "Severe Rash".  BRAND NAME IS KEFLEX.   . Enbrel [Etanercept] Rash  . Humira [Adalimumab] Rash  . Levofloxacin Other (See Comments)    REACTION: GI Intolerance BRAND NAME IS LEVAQUIN  . Sulfa Antibiotics Rash  . Sulfasalazine Rash   Follow-up  Information    [name], MD. Schedule an appointment as soon as possible for a visit in 10 day(s).   Specialty:  Family Medicine Contact information: [address] [place] [address] [phone]        [name], MD .   Specialty:  Cardiology Contact information: [address] [place] [address] [phone]           The results of significant diagnostics from this hospitalization (including imaging, microbiology, ancillary and laboratory) are listed below for reference.    Significant Diagnostic Studies: Dg Chest 2 View  Result Date: 06/07/2019 CLINICAL DATA:  Shortness of breath and fever EXAM: CHEST - 2 VIEW COMPARISON:  07/05/2017 FINDINGS: There is elevation of the left diaphragm unchanged from the prior exams. There is no focal consolidation. There is no pleural effusion or pneumothorax. The heart and mediastinal contours are unremarkable. There is thoracic aortic atherosclerosis. The osseous structures are unremarkable. IMPRESSION: No active cardiopulmonary disease. Electronically Signed   By: Kathreen Devoid   On: 06/07/2019 13:17   US Venous Img Lower Right (dvt Study)  Result Date: 06/07/2019 CLINICAL DATA:  Pain and edema EXAM: RIGHT LOWER EXTREMITY VENOUS DOPPLER ULTRASOUND TECHNIQUE: Gray-scale sonography with compression, as well as color and duplex ultrasound, were performed to evaluate  the deep venous system from the level of the common femoral vein through the popliteal and proximal calf veins. COMPARISON:  03/15/2016 FINDINGS: Normal compressibility of the right common femoral vein, visualized profunda femoral vein, and saphenofemoral junction. There is wall thickening in relatively diminutive femoral and popliteal veins, incompletely compressible, with persistent flow signal identified on color Doppler. Monophasic waveforms. Small changes in the visualized calf veins, with flow signal on color Doppler. No definite acute DVT. Survey views of the contralateral left common femoral vein are unremarkable. IMPRESSION: A 1. Extensive post thrombotic change in the RIGHT femoral and popliteal veins and visualized calf veins. No definite acute DVT. For Electronically Signed   By: Lucrezia Europe M.D.   On: 06/07/2019 15:02    Microbiology: Recent Results (from the past 240 hour(s))  SARS Coronavirus 2 (CEPHEID - Performed in [place] hospital lab), Hosp Order     Status: None   Collection Time: 06/02/19 10:00 AM  Result Value Ref Range Status   SARS Coronavirus 2 NEGATIVE NEGATIVE Final    Comment: (NOTE) If result is NEGATIVE SARS-CoV-2 target nucleic acids are NOT DETECTED. The SARS-CoV-2 RNA is generally detectable in upper and lower  respiratory specimens during the acute phase of infection. The lowest  concentration of SARS-CoV-2 viral copies this assay can detect is 250  copies / mL. A negative result does not preclude SARS-CoV-2 infection  and should not be used as the sole basis for treatment or other  patient management decisions.  A negative result may occur with  improper specimen collection / handling, submission of specimen other  than nasopharyngeal swab, presence of viral mutation(s) within the  areas targeted by this assay, and inadequate number of viral copies  (<250 copies / mL). A negative result must be combined with clinical  observations, patient history, and  epidemiological information. If result is POSITIVE SARS-CoV-2 target nucleic acids are DETECTED. The SARS-CoV-2 RNA is generally detectable in upper and lower  respiratory specimens dur ing the acute phase of infection.  Positive  results are indicative of active infection with SARS-CoV-2.  Clinical  correlation with patient history and other diagnostic information is  necessary to determine patient infection  status.  Positive results do  not rule out bacterial infection or co-infection with other viruses. If result is PRESUMPTIVE POSTIVE SARS-CoV-2 nucleic acids MAY BE PRESENT.   A presumptive positive result was obtained on the submitted specimen  and confirmed on repeat testing.  While 2019 novel coronavirus  (SARS-CoV-2) nucleic acids may be present in the submitted sample  additional confirmatory testing may be necessary for epidemiological  and / or clinical management purposes  to differentiate between  SARS-CoV-2 and other Sarbecovirus currently known to infect humans.  If clinically indicated additional testing with an alternate test  methodology [phone]) is advised. The SARS-CoV-2 RNA is generally  detectable in upper and lower respiratory sp ecimens during the acute  phase of infection. The expected result is Negative. Fact Sheet for Patients:  StrictlyIdeas.no Fact Sheet for Healthcare Providers: BankingDealers.co.za This test is not yet approved or cleared by the [place] FDA and has been authorized for detection and/or diagnosis of SARS-CoV-2 by FDA under an Emergency Use Authorization (EUA).  This EUA will remain in effect (meaning this test can be used) for the duration of the COVID-19 declaration under Section 564(b)(1) of the Act, 21 U.S.C. section 360bbb-3(b)(1), unless the authorization is terminated or revoked sooner. Performed at [facility], [address], [place], [place] [address]   Culture, blood  (Routine x 2)     Status: None (Preliminary result)   Collection Time: 06/07/19 12:16 PM  Result Value Ref Range Status   Specimen Description   Final    BLOOD LEFT ARM Performed at [facility], [address], [place], [place] [address]    Special Requests   Final    BOTTLES DRAWN AEROBIC AND ANAEROBIC Blood Culture adequate volume Performed at [facility], [address], [place], [place] [address]    Culture  Setup Time   Final    GRAM NEGATIVE RODS IN BOTH AEROBIC AND ANAEROBIC BOTTLES Gram Stain Report Called to,Read Back By and Verified With: R WAGONER,RN @0431  06/08/19 MKELLY Organism ID to follow Performed at [facility], [place] [address] [place], [place] [address]    Culture   Final    NO GROWTH 2 DAYS Performed at [facility], [address] [place], [place] [address]    Report Status PENDING  Incomplete  Blood Culture ID Panel (Reflexed)     Status: Abnormal   Collection Time: 06/07/19 12:16 PM  Result Value Ref Range Status   Enterococcus species NOT DETECTED NOT DETECTED Final   Listeria monocytogenes NOT DETECTED NOT DETECTED Final   Staphylococcus species NOT DETECTED NOT DETECTED Final   Staphylococcus aureus (BCID) NOT DETECTED NOT DETECTED Final   Streptococcus species NOT DETECTED NOT DETECTED Final   Streptococcus agalactiae NOT DETECTED NOT DETECTED Final   Streptococcus pneumoniae NOT DETECTED NOT DETECTED Final   Streptococcus pyogenes NOT DETECTED NOT DETECTED Final   Acinetobacter baumannii NOT DETECTED NOT DETECTED Final   Enterobacteriaceae species DETECTED (A) NOT DETECTED Final    Comment: Enterobacteriaceae represent a large family of gram-negative bacteria, not a single organism. CRITICAL RESULT CALLED TO, READ BACK BY AND VERIFIED WITH: G. Coffee PharmD 10:45 06/08/19 ([email or web address])    Enterobacter cloacae complex NOT DETECTED NOT DETECTED Final   Escherichia coli NOT DETECTED NOT DETECTED Final   Klebsiella oxytoca NOT DETECTED NOT DETECTED  Final   Klebsiella pneumoniae NOT DETECTED NOT DETECTED Final   Proteus species NOT DETECTED NOT DETECTED Final   Serratia marcescens DETECTED (A) NOT DETECTED Final  Comment: CRITICAL RESULT CALLED TO, READ BACK BY AND VERIFIED WITH: G. Coffee PharmD 10:45 06/08/19 ([email or web address])    Carbapenem resistance NOT DETECTED NOT DETECTED Final   Haemophilus influenzae NOT DETECTED NOT DETECTED Final   Neisseria meningitidis NOT DETECTED NOT DETECTED Final   Pseudomonas aeruginosa NOT DETECTED NOT DETECTED Final   Candida albicans NOT DETECTED NOT DETECTED Final   Candida glabrata NOT DETECTED NOT DETECTED Final   Candida krusei NOT DETECTED NOT DETECTED Final   Candida parapsilosis NOT DETECTED NOT DETECTED Final   Candida tropicalis NOT DETECTED NOT DETECTED Final    Comment: Performed at [facility], [place] [address], [place], [place] [address]  Culture, blood (Routine x 2)     Status: None (Preliminary result)   Collection Time: 06/07/19 12:24 PM  Result Value Ref Range Status   Specimen Description BLOOD RIGHT ARM  Final   Special Requests   Final    BOTTLES DRAWN AEROBIC AND ANAEROBIC Blood Culture adequate volume   Culture   Final    NO GROWTH 2 DAYS Performed at [facility], [address], [place], [place] [address]    Report Status PENDING  Incomplete  SARS Coronavirus 2 (CEPHEID - Performed in [place] hospital lab), Hosp Order     Status: None   Collection Time: 06/07/19  1:16 PM  Result Value Ref Range Status   SARS Coronavirus 2 NEGATIVE NEGATIVE Final    Comment: (NOTE) If result is NEGATIVE SARS-CoV-2 target nucleic acids are NOT DETECTED. The SARS-CoV-2 RNA is generally detectable in upper and lower  respiratory specimens during the acute phase of infection. The lowest  concentration of SARS-CoV-2 viral copies this assay can detect is 250  copies / mL. A negative result does not preclude SARS-CoV-2 infection  and should not be used as the sole basis for treatment or  other  patient management decisions.  A negative result may occur with  improper specimen collection / handling, submission of specimen other  than nasopharyngeal swab, presence of viral mutation(s) within the  areas targeted by this assay, and inadequate number of viral copies  (<250 copies / mL). A negative result must be combined with clinical  observations, patient history, and epidemiological information. If result is POSITIVE SARS-CoV-2 target nucleic acids are DETECTED. The SARS-CoV-2 RNA is generally detectable in upper and lower  respiratory specimens dur ing the acute phase of infection.  Positive  results are indicative of active infection with SARS-CoV-2.  Clinical  correlation with patient history and other diagnostic information is  necessary to determine patient infection status.  Positive results do  not rule out bacterial infection or co-infection with other viruses. If result is PRESUMPTIVE POSTIVE SARS-CoV-2 nucleic acids MAY BE PRESENT.   A presumptive positive result was obtained on the submitted specimen  and confirmed on repeat testing.  While 2019 novel coronavirus  (SARS-CoV-2) nucleic acids may be present in the submitted sample  additional confirmatory testing may be necessary for epidemiological  and / or clinical management purposes  to differentiate between  SARS-CoV-2 and other Sarbecovirus currently known to infect humans.  If clinically indicated additional testing with an alternate test  methodology [phone]) is advised. The SARS-CoV-2 RNA is generally  detectable in upper and lower respiratory sp ecimens during the acute  phase of infection. The expected result is Negative. Fact Sheet for Patients:  StrictlyIdeas.no Fact Sheet for Healthcare Providers: BankingDealers.co.za This test is not yet approved or cleared by the [place] FDA  and has been authorized for detection and/or diagnosis of  SARS-CoV-2 by FDA under an Emergency Use Authorization (EUA).  This EUA will remain in effect (meaning this test can be used) for the duration of the COVID-19 declaration under Section 564(b)(1) of the Act, 21 U.S.C. section 360bbb-3(b)(1), unless the authorization is terminated or revoked sooner. Performed at [facility], [address] [place], [place] [address]   MRSA PCR Screening     Status: None   Collection Time: 06/07/19 10:01 PM  Result Value Ref Range Status   MRSA by PCR NEGATIVE NEGATIVE Final    Comment:        The GeneXpert MRSA Assay (FDA approved for NASAL specimens only), is one component of a comprehensive MRSA colonization surveillance program. It is not intended to diagnose MRSA infection nor to guide or monitor treatment for MRSA infections. Performed at [facility], [address], [place], [place] [address]      Labs: Basic Metabolic Panel: Recent Labs  Lab 06/07/19 1216 06/08/19 0543 06/09/19 0407  NA 142 139 140  K 3.9 3.7 3.5  CL 102 109 107  CO2 25 22 25   GLUCOSE 120* 112* 108*  BUN 12 14 13   CREATININE 1.24 1.20 1.10  CALCIUM 9.5 8.2* 8.6*  MG 1.7  --   --   PHOS 2.4*  --   --    Liver Function Tests: Recent Labs  Lab 06/07/19 1216 06/08/19 0543 06/09/19 0407  AST 30 29 29   ALT 24 19 20   ALKPHOS 64 46 51  BILITOT 1.0 0.6 0.5  PROT 6.6 5.3* 5.6*  ALBUMIN 4.2 3.2* 3.3*   CBC: Recent Labs  Lab 06/07/19 1216 06/08/19 0543 06/09/19 0407  WBC 18.6* 18.1* 11.3*  NEUTROABS 16.3* 16.3* 8.7*  HGB 15.2 12.6* 12.5*  HCT 47.4 39.5 38.7*  MCV 93.9 94.3 93.3  PLT 160 130* 124*    BNP (last 3 results) Recent Labs    06/07/19 1216  BNP 38.0    ProBNP (last 3 results) Recent Labs    02/08/19 1437  PROBNP 26.0    Signed:  Barton Dubois MD.  [facility] 06/09/2019, 9:11 AM

## 2019-06-09 NOTE — Care Management Important Message (Signed)
Important Message  Patient Details  Name: [name] MRN: [id number] Date of Birth: 07-31-1951   Medicare Important Message Given:  Yes    Tommy Medal 06/09/2019, 11:10 AM

## 2019-06-09 NOTE — Progress Notes (Signed)
ANTICOAGULATION CONSULT NOTE - Initial Consult  Pharmacy Consult for Coumadin Indication: DVT   Patient Measurements: Height: 5\' 8"  (172.7 cm) Weight: 233 lb 4 oz (105.8 kg) IBW/kg (Calculated) : 68.4  Vital Signs: Temp: 99.1 F (37.3 C) (06/10 0723) Temp Source: Oral (06/10 0723) BP: 130/115 (06/10 0500) Pulse Rate: 86 (06/10 0723)  Labs: Recent Labs    06/07/19 1216 06/08/19 0543 06/09/19 0407  HGB 15.2 12.6* 12.5*  HCT 47.4 39.5 38.7*  PLT 160 130* 124*  LABPROT 25.8* 26.6* 24.7*  INR 2.4* 2.5* 2.3*  CREATININE 1.24 1.20 1.10    Estimated Creatinine Clearance: 75.8 mL/min (by C-G formula based on SCr of 1.1 mg/dL).  Assessment: 68 yo male presented to ED with fever and swollen Right leg. He is on chronic anticoagulation with Coumadin for previous DVT. INR is therapeutic at 2.4.  Home dose is 3mg  HS  Goal of Therapy:  INR 2-3 Monitor platelets by anticoagulation protocol: Yes   Plan:  Give Coumadin 3mg  po x 1 today for INR 2.3 Daily PT-INR Monitor for S/S of bleeding   Despina Pole, Pharm. D. Clinical Pharmacist 06/09/2019 9:36 AM

## 2019-06-10 DIAGNOSIS — C61 Malignant neoplasm of prostate: Secondary | ICD-10-CM | POA: Diagnosis not present

## 2019-06-10 LAB — CULTURE, BLOOD (ROUTINE X 2): Special Requests: ADEQUATE

## 2019-06-10 NOTE — Progress Notes (Signed)
Virtual Visit via Telephone Note  I connected with [name] on 06/11/19 at 11:30 AM EDT by telephone and verified that I am speaking with the correct person using two identifiers.  Location: Patient: Home Provider: [facility] - [address], [place], [place], [place] [address]   I discussed the limitations, risks, security and privacy concerns of performing an evaluation and management service by telephone and the availability of in person appointments. I also discussed with the patient that there may be a patient responsible charge related to this service. The patient expressed understanding and agreed to proceed.  Patient consented to consult via telephone: Yes People present and their role in pt care: Pt    History of Present Illness:  68 year old male former smoker followed in our office for COPD and severe obstructive sleep apnea Past medical history: GERD, nausea, chronic anticoagulation, rheumatoid arthritis (on Actemra), DVT, CHF, history of prostate cancer, Serratia sepsis (June/2020) Maintenance: Breo Ellipta 100, Incruse Ellipta Patient of: Dr. Chase Caller for pulmonary, Dr. Elsworth Soho for sleep  Chief complaint: Follow-up for severe obstructive sleep apnea, recent hospital discharge for Serratia sepsis  68 year old male former smoker completing a tele-visit with our office today to discuss CPAP compliance.  [facility]  Patient [facility] to [facility] on 06[facility]07/2019 febrile with suspected right lower extremity cellulitis.  Code sepsis was called.Patient was discharged on 06/09/2019.  Patient was Serratia bacteremia.  Patient was treated with vancomycin and cefepime.  Patient was discharged with ciprofloxacin for another 12 days.  Suspected source of infection is from lower extremity cellulitis.  Telephone note on 06/08/2019 from Dr. Elsworth Soho recommends if the patient continues to have central events as seen on current compliance  report.  Central apneas are still present.  Patient will need a 4-week follow-up to further evaluate and likely will need to be started on BiPAP therapy.  CPAP compliance report listed below:  05/11/2019-06/09/2019 20-27 out of last 30 days use, 22 of those days greater than 4 hours, average usage 5 hours and 41 minutes, APAP setting 5-15, AHI 19.3, central apneas 7.5  Patient reports that overall since hospital discharge she has been doing well.  He has been elevating his legs.  Right lower extremity still is red and slightly swollen but things are improving daily.  Patient is currently taking his ciprofloxacin which she was prescribed at discharge.  He has planned follow-ups with primary care on 06/15/2019, rheumatology on 07/05/2019, and cardiology on 07/13/2019.    Observations/Objective:  12/04/2018-CT chest without contrast-no acute cardiopulmonary abnormalities, diffuse bronchial wall thickening with emphysema, peripheral subpleural reticulation identified within the right lung which appears to be slightly progressive compared from previous study there is mild bronchiectasis  06/07/2019- venous lower Doppler -right- extensive post thrombin biotic change in the right femoral and popliteal veins and visualized calf veins, no definitive acute DVT  04/26/2019-Home sleep study- AHI 38.5 an hour, SaO2 low 68%, severe O2 drops, average O2 89%  06/03/2019-CPAP titration- optimal Pap pressure could not be selected, recommendations: Trial of auto BiPAP 6 to 18 cm with medium full facemask  02/08/2019-pulmonary function test- FVC 2.11 (48% predicted), postbronchodilator ratio 85, postbronchodilator FEV1 1.90 (59% predicted), no bronchodilator response, mid flow reversibility, DLCO 71  06/08/2019-echocardiogram-LV ejection fraction 55 to 60%  06/07/2019 - Sars COV 2 - negative   Assessment and Plan:  Chronic diastolic HF (heart failure) ([place]) Plan: Continue diuretics as prescribed Continue to weigh yourself  daily Continue  to elevate lower extremities Continue follow-up with cardiology and primary care  OSA (obstructive sleep apnea) Assessment: Severe obstructive sleep apnea CPAP compliance report today shows adequate compliance, central apneas are present 7.5, AHI is not controlled at 19.3 CPAP titration recommends a BiPAP trial of auto BiPAP  Plan: 4-week follow-up with our office with a CPAP compliance report Likely will need BiPAP start in 4 weeks after that office visit if central apneas are still present Continue to work on diet and exercise   Chronic obstructive pulmonary disease ([place]) Plan: Continue Breo Ellipta and Incruse Ellipta Follow-up in 4 weeks  Rheumatoid arthritis ([place]) Plan: Continue follow-up with rheumatology Continue Actemra infusions under the guidance of rheumatology  Serratia sepsis ([place]) Plan: Continue ciprofloxacin as prescribed after hospital discharge on 06/09/2019 Complete follow-ups with primary care next week Will need repeat blood work with primary care   Chronic anticoagulation Plan: Notify primary care today (06/11/2019) that you are currently taking your Coumadin as well as taking ciprofloxacin since hospital discharge to see if they need to make any changes regarding your Coumadin  Follow Up Instructions:  Return in about 4 weeks (around 07/09/2019), or if symptoms worsen or fail to improve, for Follow up with Wyn Quaker FNP-C, Follow up with Dr. Elsworth Soho, Follow up with Dr. Purnell Shoemaker.   I discussed the assessment and treatment plan with the patient. The patient was provided an opportunity to ask questions and all were answered. The patient agreed with the plan and demonstrated an understanding of the instructions.   The patient was advised to call back or seek an in-person evaluation if the symptoms worsen or if the condition fails to improve as anticipated.  I provided 32 minutes of non-face-to-face time during this encounter.  Multiple times  to connect to the patient via telephone were made during this call.  Patient continues to have telephone issues with both his home as well as mobile line.    Lauraine Rinne, NP

## 2019-06-11 ENCOUNTER — Ambulatory Visit (INDEPENDENT_AMBULATORY_CARE_PROVIDER_SITE_OTHER): Payer: Medicare Other | Admitting: Pulmonary Disease

## 2019-06-11 ENCOUNTER — Other Ambulatory Visit: Payer: Self-pay

## 2019-06-11 ENCOUNTER — Encounter: Payer: Self-pay | Admitting: Pulmonary Disease

## 2019-06-11 DIAGNOSIS — M069 Rheumatoid arthritis, unspecified: Secondary | ICD-10-CM | POA: Diagnosis not present

## 2019-06-11 DIAGNOSIS — Z7901 Long term (current) use of anticoagulants: Secondary | ICD-10-CM | POA: Diagnosis not present

## 2019-06-11 DIAGNOSIS — A419 Sepsis, unspecified organism: Secondary | ICD-10-CM | POA: Diagnosis not present

## 2019-06-11 DIAGNOSIS — R600 Localized edema: Secondary | ICD-10-CM | POA: Diagnosis not present

## 2019-06-11 DIAGNOSIS — I5032 Chronic diastolic (congestive) heart failure: Secondary | ICD-10-CM

## 2019-06-11 DIAGNOSIS — J449 Chronic obstructive pulmonary disease, unspecified: Secondary | ICD-10-CM | POA: Diagnosis not present

## 2019-06-11 DIAGNOSIS — L0390 Cellulitis, unspecified: Secondary | ICD-10-CM | POA: Diagnosis not present

## 2019-06-11 DIAGNOSIS — L03119 Cellulitis of unspecified part of limb: Secondary | ICD-10-CM | POA: Diagnosis not present

## 2019-06-11 DIAGNOSIS — I82409 Acute embolism and thrombosis of unspecified deep veins of unspecified lower extremity: Secondary | ICD-10-CM | POA: Diagnosis not present

## 2019-06-11 DIAGNOSIS — A4153 Sepsis due to Serratia: Secondary | ICD-10-CM | POA: Diagnosis not present

## 2019-06-11 DIAGNOSIS — Z681 Body mass index (BMI) 19 or less, adult: Secondary | ICD-10-CM | POA: Diagnosis not present

## 2019-06-11 DIAGNOSIS — G4733 Obstructive sleep apnea (adult) (pediatric): Secondary | ICD-10-CM | POA: Diagnosis not present

## 2019-06-11 NOTE — Patient Instructions (Addendum)
Contact PCP today about Cipro and Coumadin   Breo Ellipta 100 >>> Take 1 puff daily in the morning right when you wake up >>>Rinse your mouth out after use >>>This is a daily maintenance inhaler, NOT a rescue inhaler >>>Contact our office if you are having difficulties affording or obtaining this medication >>>It is important for you to be able to take this daily and not miss any doses   Incruse Ellipta  >>> Take 1 puff daily in the morning right when you wake up >>>Rinse your mouth out after use >>>This is a daily maintenance inhaler, NOT a rescue inhaler >>>Contact our office if you are having difficulties affording or obtaining this medication >>>It is important for you to be able to take this daily and not miss any doses    Only use your albuterol as a rescue medication to be used if you can't catch your breath by resting or doing a relaxed purse lip breathing pattern.  - The less you use it, the better it will work when you need it. - Ok to use up to 2 puffs  every 4 hours if you must but call for immediate appointment if use goes up over your usual need - Don't leave home without it !!  (think of it like the spare tire for your car)    We recommend that you continue using your CPAP daily >>>Keep up the hard work using your device >>> Goal should be wearing this for the entire night that you are sleeping, at least 4 to 6 hours  Remember:  . Do not drive or operate heavy machinery if tired or drowsy.  . Please notify the supply company and office if you are unable to use your device regularly due to missing supplies or machine being broken.  . Work on maintaining a healthy weight and following your recommended nutrition plan  . Maintain proper daily exercise and movement  . Maintaining proper use of your device can also help improve management of other chronic illnesses such as: Blood pressure, blood sugars, and weight management.   BiPAP/ CPAP Cleaning:  >>>Clean weekly, with  Dawn soap, and bottle brush.  Set up to air dry.  Keep scheduled follow-up with primary care on 06/15/2019 Keep scheduled follow-up with rheumatology 07/05/2019 @ 1045am Keep scheduled follow-up with cardiology 07/13/2019 @ 340pm    Return in about 4 weeks (around 07/09/2019), or if symptoms worsen or fail to improve, for Follow up with Wyn Quaker FNP-C, Follow up with Dr. Elsworth Soho, Follow up with Dr. Purnell Shoemaker.    Coronavirus (COVID-19) Are you at risk?  Are you at risk for the Coronavirus (COVID-19)?  To be considered HIGH RISK for Coronavirus (COVID-19), you have to meet the following criteria:  . Traveled to [place], [place], [place], [place] or [place]; or in the [place] to [place], [place], [place], or [place]; and have fever, cough, and shortness of breath within the last 2 weeks of travel OR . Been in close contact with a person diagnosed with COVID-19 within the last 2 weeks and have fever, cough, and shortness of breath . IF YOU DO NOT MEET THESE CRITERIA, YOU ARE CONSIDERED LOW RISK FOR COVID-19.  What to do if you are HIGH RISK for COVID-19?  [name] If you are having a medical emergency, call 911. . Seek medical care right away. Before you go to a doctor's office, urgent care or emergency department, call ahead and tell them about your recent travel,  contact with someone diagnosed with COVID-19, and your symptoms. You should receive instructions from your physician's office regarding next steps of care.  . When you arrive at healthcare provider, tell the healthcare staff immediately you have returned from visiting [place], [place], [place], [place] or [place]; or traveled in the [place] to [place], [place], [place], or [place]; in the last two weeks or you have been in close contact with a person diagnosed with COVID-19 in the last 2 weeks.   . Tell the health care staff about your symptoms: fever, cough and shortness of breath. . After you have been seen by a  medical provider, you will be either: o Tested for (COVID-19) and discharged home on quarantine except to seek medical care if symptoms worsen, and asked to  - Stay home and avoid contact with others until you get your results (4-5 days)  - Avoid travel on public transportation if possible (such as bus, train, or airplane) or o Sent to the Emergency Department by EMS for evaluation, COVID-19 testing, and possible admission depending on your condition and test results.  What to do if you are LOW RISK for COVID-19?  Reduce your risk of any infection by using the same precautions used for avoiding the common cold or flu:  [name] Wash your hands often with soap and warm water for at least 20 seconds.  If soap and water are not readily available, use an alcohol-based hand sanitizer with at least 60% alcohol.  . If coughing or sneezing, cover your mouth and nose by coughing or sneezing into the elbow areas of your shirt or coat, into a tissue or into your sleeve (not your hands). . Avoid shaking hands with others and consider head nods or verbal greetings only. . Avoid touching your eyes, nose, or mouth with unwashed hands.  . Avoid close contact with people who are sick. . Avoid places or events with large numbers of people in one location, like concerts or sporting events. . Carefully consider travel plans you have or are making. . If you are planning any travel outside or inside the [place], visit the CDC's Travelers' Health webpage for the latest health notices. . If you have some symptoms but not all symptoms, continue to monitor at home and seek medical attention if your symptoms worsen. . If you are having a medical emergency, call 911.   [place] / e-Visit: [email or web address]         [facility]: [facility]: [phone]                   [facility]  Urgent Care: [phone]           It is flu season:   >>> Best ways to protect herself from the flu: Receive the yearly flu vaccine, practice good hand hygiene washing with soap and also using hand sanitizer when available, eat a nutritious meals, get adequate rest, hydrate appropriately   Please contact the office if your symptoms worsen or you have concerns that you are not improving.   Thank you for choosing [facility] for your healthcare, and for allowing [place] to partner with you on your healthcare journey. I am thankful to be able to provide care to you today.   Wyn Quaker FNP-C,

## 2019-06-11 NOTE — Assessment & Plan Note (Signed)
Plan: Continue diuretics as prescribed Continue to weigh yourself daily Continue to elevate lower extremities Continue follow-up with cardiology and primary care

## 2019-06-11 NOTE — Assessment & Plan Note (Signed)
Assessment: Severe obstructive sleep apnea CPAP compliance report today shows adequate compliance, central apneas are present 7.5, AHI is not controlled at 19.3 CPAP titration recommends a BiPAP trial of auto BiPAP  Plan: 4-week follow-up with our office with a CPAP compliance report Likely will need BiPAP start in 4 weeks after that office visit if central apneas are still present Continue to work on diet and exercise

## 2019-06-11 NOTE — Assessment & Plan Note (Signed)
Plan: Continue Breo Ellipta and Incruse Ellipta Follow-up in 4 weeks

## 2019-06-11 NOTE — Patient Outreach (Addendum)
[place] [facility]) Care Management  06/11/2019  [name] 05-Aug-1951 [id number]   Telephone Screen  Referral Date: 6/12/20220 Referral Source: MD Office Referral Reason: "complex medical plan, recent discharge from ED, COPD" Insurance: Medicare   Outreach attempt # 1 to patient. Spoke briefly with patient as he voiced he was expecting an important call and could not talk at present. Patient inquiring about nature of call and RN CM discussed referral source and reason. Patient reports he had tele visit with NP this morning but did not get to complete visit as phone disconnected. He was unaware of referral being placed. He was skeptical about things and requested that RN CM call him back another time.      Plan: RN CM will make outreach attempt to patient within 3-4 business days.    [name], RN,BSN,CCM [place] Management Telephonic Care Management Coordinator Direct Phone: [phone] Toll Free: [phone] Fax: [phone]

## 2019-06-11 NOTE — Assessment & Plan Note (Signed)
Plan: Notify primary care today (06/11/2019) that you are currently taking your Coumadin as well as taking ciprofloxacin since hospital discharge to see if they need to make any changes regarding your Coumadin

## 2019-06-11 NOTE — Assessment & Plan Note (Signed)
Plan: Continue ciprofloxacin as prescribed after hospital discharge on 06/09/2019 Complete follow-ups with primary care next week Will need repeat blood work with primary care

## 2019-06-11 NOTE — Assessment & Plan Note (Signed)
Plan: Continue follow-up with rheumatology Continue Actemra infusions under the guidance of rheumatology

## 2019-06-12 LAB — CULTURE, BLOOD (ROUTINE X 2)
Culture: NO GROWTH
Special Requests: ADEQUATE

## 2019-06-13 ENCOUNTER — Other Ambulatory Visit: Payer: Self-pay | Admitting: Rheumatology

## 2019-06-14 ENCOUNTER — Other Ambulatory Visit: Payer: Self-pay | Admitting: *Deleted

## 2019-06-14 NOTE — Telephone Encounter (Signed)
Last Visit: 02/03/2019 Next Visit: 07/05/2019 Labs: 06/09/2019 WBC 11.3, RBC 4.15, hemoglobin 12.5, HCT 38.7, platelets 124  Okay to refill allopurinol?

## 2019-06-14 NOTE — Telephone Encounter (Signed)
Ok to refill allopurinol

## 2019-06-14 NOTE — Patient Outreach (Signed)
[place] [facility]) Care Management  06/14/2019  [name] February 21, 1951 [id number]   [name]  RED ON EMMI ALERT [name] Date: Friday 06/11/19 [name] Reason: Know who to call about changes in condition? No  Unfilled prescriptions? Yes  Able to fill today/tomorrow? No Insurance: medicare  [facility] admissions x  ED visits x in the last 6 months    Outreach attempt #1  Patient is able to verify HIPAA [facility] Care Management RN reviewed and addressed red alert with patient Concerns with being offered medications during hospital    Doing better but still with some sob with activity  Discussed with him he may receive further call from [name] for complex issues He agreed to receive future calls prn  His recent hospitalization experience was the least favorable experience he states he has experienced  CM offered [facility] Patient experience number but he refused   EMMI:  Partially correct  Know who to call about changes in condition?--- He states he did not know who to contact Cm reviewed that after d/c his primary is who to call about changes He voiced understanding and appreciation for the clarification    Unfilled prescriptions and able to fill? --He reports all medications filled He reports he has his new ordered antibiotic , Cipro and florastor and is taking arava as changed He was able to read off his after summary sheet to CM    Transition of care assessment completed  Social: [name] lives at home with his wife [name] He is independent/assist with care needs and had wife to assist with transportation to medical appointments   Conditions: sepsis due to cellulitis, COPD, CHF, DVT, GERD, seizure disorder, rheumatoid arthritis, plantar pustular psoriasis, primary osteoarthritis of both feet, both hands, flatulence, Diverticulosis of colon without hemorrhage, obesity, gout, hx of prostate cancer   DME: nebulizer CPAP  Medications: He denies  concerns with taking medications as prescribed, affording medications, side effects of medications and questions about medications  Appointments:See [name] primary care MD  on 06/15/19    Advance Directives: Denies need for assist with advance directives    Consent: [facility] RN CM reviewed [name] services with patient. Patient gave verbal consent for services [name] telephonic RN CM.   Advised patient that there will be further automated EMMI- post discharge calls to assess how the patient is doing following the recent hospitalization Advised the patient that another call may be received from a nurse if any of their responses were abnormal. Patient voiced understanding and was appreciative of f/u call.   Plan: [name] RN CM will close case at this time as patient has been assessed and no needs identified/needs resolved.   Pt encouraged to return a call to [name] prn  [name] RN CM sent a successful outreach letter as discussed with [name] brochure enclosed for review   [name], RN, BSN, [name] Coordinator Office number [phone] Mobile number [phone]  Main [facility] number [phone] Fax number [phone]

## 2019-06-15 ENCOUNTER — Other Ambulatory Visit: Payer: Self-pay

## 2019-06-15 DIAGNOSIS — E6609 Other obesity due to excess calories: Secondary | ICD-10-CM | POA: Diagnosis not present

## 2019-06-15 DIAGNOSIS — I82409 Acute embolism and thrombosis of unspecified deep veins of unspecified lower extremity: Secondary | ICD-10-CM | POA: Diagnosis not present

## 2019-06-15 DIAGNOSIS — Z6832 Body mass index (BMI) 32.0-32.9, adult: Secondary | ICD-10-CM | POA: Diagnosis not present

## 2019-06-15 DIAGNOSIS — Z1389 Encounter for screening for other disorder: Secondary | ICD-10-CM | POA: Diagnosis not present

## 2019-06-15 DIAGNOSIS — L03115 Cellulitis of right lower limb: Secondary | ICD-10-CM | POA: Diagnosis not present

## 2019-06-15 NOTE — Patient Outreach (Signed)
[place] [facility]) Care Management  06/15/2019  [name] [birth year] [id number]   Telephone Screen  Referral Date: 6/12/20220 Referral Source: MD Office Referral Reason: "complex medical plan, recent discharge from ED, COPD" Insurance: Medicare    Outreach attempt #2 to patient. No answer at present.    Plan: RN CM will make outreach attempt to patient within 3-4 business days.    [name], RN,BSN,CCM [place] Management Telephonic Care Management Coordinator Direct Phone: [phone] Toll Free: [phone] Fax: [phone]

## 2019-06-16 ENCOUNTER — Ambulatory Visit (INDEPENDENT_AMBULATORY_CARE_PROVIDER_SITE_OTHER): Payer: Medicare Other | Admitting: Urology

## 2019-06-16 DIAGNOSIS — N3941 Urge incontinence: Secondary | ICD-10-CM

## 2019-06-16 DIAGNOSIS — Z6832 Body mass index (BMI) 32.0-32.9, adult: Secondary | ICD-10-CM | POA: Diagnosis not present

## 2019-06-16 DIAGNOSIS — C61 Malignant neoplasm of prostate: Secondary | ICD-10-CM

## 2019-06-16 DIAGNOSIS — E6609 Other obesity due to excess calories: Secondary | ICD-10-CM | POA: Diagnosis not present

## 2019-06-16 DIAGNOSIS — L03115 Cellulitis of right lower limb: Secondary | ICD-10-CM | POA: Diagnosis not present

## 2019-06-17 ENCOUNTER — Other Ambulatory Visit: Payer: Self-pay

## 2019-06-17 NOTE — Patient Outreach (Signed)
[place] [facility]) Care Management  06/17/2019  AVONTAE BURKHEAD 23-May-1951 [id number]   Telephone Screen  Referral Date:6/12/20220 Referral Source:MD Office Referral Reason:"complex medical plan, recent discharge from ED, COPD" Insurance:Medicare   Outreach attempt #3 to patient. No answer at present.     Plan: RN CM will close case if no response from letter sent to patient.   [name], RN,BSN,CCM [place] Management Telephonic Care Management Coordinator Direct Phone: [phone] Toll Free: [phone] Fax: [phone]

## 2019-06-21 ENCOUNTER — Ambulatory Visit (HOSPITAL_COMMUNITY)
Admission: RE | Admit: 2019-06-21 | Discharge: 2019-06-21 | Disposition: A | Discharge status: Home / Self Care | Payer: Medicare Other | Source: Ambulatory Visit | Attending: Rheumatology | Admitting: Rheumatology

## 2019-06-21 ENCOUNTER — Other Ambulatory Visit: Payer: Self-pay

## 2019-06-21 NOTE — Progress Notes (Signed)
Pt here today for actemra. Pt on cipro 500mg  daily for sepis due to cellulitis of leg.  Orders given from dr Estanislado Pandy to hold actemra today and have pt receive clearance from PCP prior to scheduling next actemra.  Pt verbalized understanding.

## 2019-06-22 ENCOUNTER — Encounter (HOSPITAL_COMMUNITY): Payer: Medicare Other

## 2019-06-25 NOTE — Progress Notes (Signed)
Office Visit Note  Patient: [name]             Date of Birth: 10/19/1951           MRN: [id number]             PCP: [name], MD Referring: [name], MD Visit Date: 07/05/2019 Occupation: @GUAROCC @  Subjective:  Cellulitis on right lower extremity   History of Present Illness: [name] is a 68 y.o. male with history of rheumatoid arthritis, gout, and osteoarthritis. He was evaluated in the ED on 06/07/19 for sepsis due to cellulitis on the right lower extremity.  He was discharged on ciprofloxacin.  He has not followed up at the wound center yet. He is currently holding [place] and has postponed Actemra infusions.  His last infusion was about 3 weeks ago.  He denies any joint pain or joint swelling at this time.   Activities of Daily Living:  Patient reports morning stiffness for 5-10  minutes.   Patient Reports nocturnal pain.  Difficulty dressing/grooming: Denies Difficulty climbing stairs: Denies Difficulty getting out of chair: Denies Difficulty using hands for taps, buttons, cutlery, and/or writing: Denies  Review of Systems  Constitutional: Positive for fatigue. Negative for night sweats.  HENT: Negative for mouth sores, mouth dryness and nose dryness.   Eyes: Negative for redness, visual disturbance and dryness.  Respiratory: Negative for cough, hemoptysis, shortness of breath and difficulty breathing.   Cardiovascular: Negative for chest pain, palpitations, hypertension, irregular heartbeat and swelling in legs/feet.  Gastrointestinal: Negative for blood in stool, constipation and diarrhea.  Endocrine: Negative for increased urination.  Genitourinary: Negative for painful urination.  Musculoskeletal: Positive for arthralgias, joint pain and morning stiffness. Negative for joint swelling, myalgias, muscle weakness, muscle tenderness and myalgias.  Skin: Positive for redness. Negative for color change, rash, hair loss, nodules/bumps, skin tightness,  ulcers and sensitivity to sunlight.  Allergic/Immunologic: Negative for susceptible to infections.  Neurological: Negative for dizziness, fainting, memory loss, night sweats and weakness.  Hematological: Negative for swollen glands.  Psychiatric/Behavioral: Negative for depressed mood and sleep disturbance. The patient is nervous/anxious.     PMFS History:  Patient Active Problem List   Diagnosis Date Noted  . Class 2 severe obesity due to excess calories with serious comorbidity and body mass index (BMI) of 35.0 to 35.9 in adult [facility])   . Chronic diastolic HF (heart failure) ([place])   . Serratia sepsis ([place]) 06/08/2019  . Bacteremia due to Gram-negative bacteria 06/08/2019  . Sepsis due to cellulitis ([place]) 06/07/2019  . Voice hoarseness 04/26/2019  . OSA (obstructive sleep apnea) 03/10/2019  . Abnormal CT of the chest 02/09/2019  . Dyspnea on exertion 10/22/2017  . History of seizure disorder 02/27/2017  . Primary osteoarthritis of both hands 02/27/2017  . Primary osteoarthritis of both feet 02/27/2017  . Idiopathic gout of multiple sites 02/27/2017  . High risk medication use 12/29/2016  . History of prostate cancer 12/29/2016  . Primary osteoarthritis of both knees 12/29/2016  . Chronic idiopathic gout involving toe without tophus 12/29/2016  . History of CHF (congestive heart failure) 12/29/2016  . History of COPD 12/29/2016  . Plantar pustular psoriasis 12/29/2016  . DVT (deep venous thrombosis) ([place]) 10/31/2016  . Chronic obstructive pulmonary disease ([place]) 10/31/2016  . CHF (congestive heart failure) ([place]) 10/31/2016  . Prostate cancer ([place]) 08/30/2015  . Malignant neoplasm of prostate ([place]) 07/04/2015  . Chest pain at rest 07/05/2014  . Weakness 07/05/2014  .  Complicated postphlebitic syndrome 11/16/2013  . Personal history of DVT (deep vein thrombosis) 05/18/2013  . Chronic anticoagulation 05/18/2013  . Diverticulosis of colon without hemorrhage 05/18/2013  .  Rheumatoid arthritis ([place]) 05/18/2013  . Seizure disorder ([place]) 05/18/2013  . Hx of adenomatous colonic polyps 05/18/2013  . GERD 05/08/2010  . NAUSEA 05/08/2010  . FLATULENCE-GAS-BLOATING 05/08/2010    Past Medical History:  Diagnosis Date  . Anxiety    hx of   . Arthritis    RA  . Asthma   . Clotting disorder (HCC)    DVT both legs   . COPD (chronic obstructive pulmonary disease) ([place])   . DDD (degenerative disc disease), cervical    with UE's paresthesias  . Diverticulosis   . DVT, lower extremity ([place])    bilat  . 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 GERD (gastroesophageal reflux disease)   . Gout   . Heart murmur   . History of measles   . History of shingles   . Hypercholesterolemia   . IBS (irritable bowel syndrome)   . IBS (irritable bowel syndrome)   . Peripheral edema   . Pneumonia    hx of   . Prostate cancer ([place])   . Seizures ([place])    last seizure 20 years ago; on dilantin. Unknown origin.  [name] Shortness of breath dyspnea    exertion   . Sleep apnea    not on cpap  . Tubular adenoma of colon 04/2008    Family History  Problem Relation Age of Onset  . Skin cancer Father   . Stomach cancer Father   . Heart attack Father   . Alzheimer's disease Mother   . Throat cancer Brother   . Stomach cancer Brother   . Lung cancer Brother   . Heart attack Brother   . Heart attack Brother   . Breast cancer Sister   . Heart attack Sister   . Stroke Sister   . Bladder Cancer Sister   . Heart murmur Sister   . Colon cancer Brother   . Colon polyps Neg Hx    Past Surgical History:  Procedure Laterality Date  . CHOLECYSTECTOMY    . COLONOSCOPY    . CYSTOSCOPY WITH LITHOLAPAXY N/A 03/17/2019   Procedure: CYSTOSCOPY WITH LITHOLAPAXY AND REMOVAL OF FOREIGN BODY;  Surgeon: [name], MD;  Location: [facility];  Service: Urology;  Laterality: N/A;  . DOPPLER ECHOCARDIOGRAPHY N/A 01-21-2012    TECHNICALLY DIFFICULT. MILD CONCENTRIC LV HYPERTROPHY. LV CAVITY IS SMALL.. EF=> 55%. TRANSMITRAL SPECTRAL FLOW PATTREN IS SUGGESTIVE OF IMPAIRED LV RELAXATION. RV SYSTOLIC PRESSURE IS 98YMEB. LEFT ATRIAL SIZE IS NORMAL. AV APPEARS MILDLY SCLEROTIC. NO SIGN VALVE DISEASE NOTED.  [name] LYMPHADENECTOMY Bilateral 08/30/2015   Procedure: PELVIC LYMPHADENECTOMY;  Surgeon: [name], MD;  Location: [facility];  Service: Urology;  Laterality: Bilateral;  . NUCLEAR STRESS TEST N/A 01-21-2012   NORMAL PATTERN OF PERFUSION IN ALL REGIONS. POST STRESS LV SIZE IS NORMAL. NO EVIDENCE OF INDUCIBLE ISCHEMIA. EF 54%.  [name] POLYPECTOMY    . PROSTATE BIOPSY    . ROBOT ASSISTED LAPAROSCOPIC RADICAL PROSTATECTOMY N/A 08/30/2015   Procedure: ROBOTIC ASSISTED LAPAROSCOPIC RADICAL PROSTATECTOMY;  Surgeon: [name], MD;  Location: [facility];  Service: Urology;  Laterality: N/A;  . US VENOUS LOWER EXT Right 03/07/11   PERSISTENT DVT IN RIGHT LOWER EXT.WITH PERSISTANT VISUALIZATION OF HYPOECHOIC THROMBUS WITHIN THE  FEMORAL, PROFUNDA FEMORAL AND POPLITEAL VEINS. WHEN COMPARED TO PREVIOUS, CLOT IS NO LONGER IDENTIFIED WITH IN THE RIGHT CFV.   Social History   Social History Narrative  . Not on file   Immunization History  Administered Date(s) Administered  . Influenza, High Dose Seasonal PF 10/05/2018  . Influenza,inj,Quad PF,6+ Mos 08/31/2015  . Influenza-Unspecified 09/30/2016, 08/22/2017  . Pneumococcal Conjugate-13 10/05/2018  . Pneumococcal-Unspecified 12/30/2012     Objective: Vital Signs: BP 134/78 (BP Location: Left Arm, Patient Position: Sitting, Cuff Size: Normal)   Pulse 76   Resp 15   Ht 5\' 8"  (1.727 m)   Wt 215 lb 9.6 oz (97.8 kg)   BMI 32.78 kg/m    Physical Exam Vitals signs and nursing note reviewed.  Constitutional:      Appearance: He is well-developed.  HENT:     Head: Normocephalic and atraumatic.  Eyes:     Conjunctiva/sclera: Conjunctivae normal.     Pupils: Pupils are equal,  round, and reactive to light.  Neck:     Musculoskeletal: Normal range of motion and neck supple.  Cardiovascular:     Rate and Rhythm: Normal rate and regular rhythm.     Heart sounds: Normal heart sounds.  Pulmonary:     Effort: Pulmonary effort is normal.     Breath sounds: Normal breath sounds.  Abdominal:     General: Bowel sounds are normal.     Palpations: Abdomen is soft.  Skin:    General: Skin is warm and dry.     Capillary Refill: Capillary refill takes less than 2 seconds.  Neurological:     Mental Status: He is alert and oriented to person, place, and time.  Psychiatric:        Behavior: Behavior normal.      Musculoskeletal Exam: C-spine, thoracic spine, and lumbar spine good ROM.  Shoulder joints, elbow joints, wrist joints, MCPs, PIPs, DIPs good ROM with no synovitis.  Mild MCP joint synovial thickening but no synovitis.  PIP and DIP synovial thickening.  Complete fist formation bilaterally.  Hip joints, knee joints, ankle joints, MTPs, PIPs, and DIPs good ROM with no synovitis. No warmth or effusion of knee joints.  No tenderness or swelling of ankle joints.    Cellulitis on right lower extremity.   CDAI Exam: CDAI Score: - Patient Global: -; Provider Global: - Swollen: -; Tender: - Joint Exam   No joint exam has been documented for this visit   There is currently no information documented on the homunculus. Go to the Rheumatology activity and complete the homunculus joint exam.  Investigation: No additional findings.  Imaging: Dg Chest 2 View  Result Date: 06/07/2019 CLINICAL DATA:  Shortness of breath and fever EXAM: CHEST - 2 VIEW COMPARISON:  07/05/2017 FINDINGS: There is elevation of the left diaphragm unchanged from the prior exams. There is no focal consolidation. There is no pleural effusion or pneumothorax. The heart and mediastinal contours are unremarkable. There is thoracic aortic atherosclerosis. The osseous structures are unremarkable.  IMPRESSION: No active cardiopulmonary disease. Electronically Signed   By: Kathreen Devoid   On: 06/07/2019 13:17   US Venous Img Lower Right (dvt Study)  Result Date: 06/07/2019 CLINICAL DATA:  Pain and edema EXAM: RIGHT LOWER EXTREMITY VENOUS DOPPLER ULTRASOUND TECHNIQUE: Gray-scale sonography with compression, as well as color and duplex ultrasound, were performed to evaluate the deep venous system from the level of the common femoral vein through the popliteal and proximal calf veins. COMPARISON:  03/15/2016  FINDINGS: Normal compressibility of the right common femoral vein, visualized profunda femoral vein, and saphenofemoral junction. There is wall thickening in relatively diminutive femoral and popliteal veins, incompletely compressible, with persistent flow signal identified on color Doppler. Monophasic waveforms. Small changes in the visualized calf veins, with flow signal on color Doppler. No definite acute DVT. Survey views of the contralateral left common femoral vein are unremarkable. IMPRESSION: A 1. Extensive post thrombotic change in the RIGHT femoral and popliteal veins and visualized calf veins. No definite acute DVT. For Electronically Signed   By: Lucrezia Europe M.D.   On: 06/07/2019 15:02    Recent Labs: Lab Results  Component Value Date   WBC 11.3 (H) 06/09/2019   HGB 12.5 (L) 06/09/2019   PLT 124 (L) 06/09/2019   NA 140 06/09/2019   K 3.5 06/09/2019   CL 107 06/09/2019   CO2 25 06/09/2019   GLUCOSE 108 (H) 06/09/2019   BUN 13 06/09/2019   CREATININE 1.10 06/09/2019   BILITOT 0.5 06/09/2019   ALKPHOS 51 06/09/2019   AST 29 06/09/2019   ALT 20 06/09/2019   PROT 5.6 (L) 06/09/2019   ALBUMIN 3.3 (L) 06/09/2019   CALCIUM 8.6 (L) 06/09/2019   GFRAA >60 06/09/2019   QFTBGOLDPLUS Negative 02/22/2019    Speciality Comments: ACTEMRA 4mg /kg x 4 weeks PPD negative 01/02/17  Procedures:  No procedures performed Allergies: Orencia [abatacept], Carbamazepine, Celecoxib, Cephalexin,  Enbrel [etanercept], Humira [adalimumab], Levofloxacin, Sulfa antibiotics, and Sulfasalazine    Assessment / Plan:     Visit Diagnoses: Rheumatoid arthritis involving multiple sites with positive rheumatoid factor ([place]) - He has no synovitis on exam.  He has not had any recent rheumatoid arthritis flares.  He has no joint pain or joint swelling at this time.  He has mild synovial thickening of MCPs but no tenderness or synovitis noted.  He is currently holding Arava and Actemra IV infusions due to being diagnosed and treated for cellulitis of the right lower extremity.  He was evaluated in the ED on 06/07/19 and was diagnosed with sepsis due to cellulitis and was started on vancomycin and cefepime.  He was discharged on ciprofloxacin.  He has not followed up at wound care yet.  He was advised to schedule a visit ASAP for further evaluation.  He will require clearance by the wound care specialists prior restarting on Arava and Actemra infusions.  He will follow up in our office in 3 months.   High risk medication use He will require clearance by wound care prior to resuming Actemra and Arava. - Actemra IV infusion 4 mg/kg every 28 days and Arava 20 mg daily.  Last infusion on 05/25/2019.  Last TB gold negative on 02/22/2019 and will monitor yearly.  Most recent CBC showed elevated WBC count and anemia on 06/09/2019.  Most recent CMP within normal limits except for low calcium and albumin on 06/09/2019.  Due for CBC/CMP in September will monitor every 3 months.  He received a high-dose flu vaccine in October and previously Prevnar 13 and Pneumovax 23.    Chronic idiopathic gout involving toe without tophus, unspecified laterality -He has not had any recent gout flares.  He is taking allopurinol 200 mg by mouth daily.  uric acid: 03/18/2018 7.3   Primary osteoarthritis of both hands -He has PIP and DIP synovial thickening. Complete fist formation bilaterally.  No synovitis or tenderness noted.  Joint protection and  muscle strengthening were discussed.   Primary osteoarthritis of both knees - He  has good ROM with no discomfort.  No warmth or effusion of knee joints noted.   Primary osteoarthritis of both feet - He has no discomfort at this time.   Age-related osteoporosis without current pathological fracture - He is on Fosamax 70mg  weekly (started 09/29/17). 09/24/2017 DXA T score -2.60, BMD 0.571 right femoral neck.   Cellulitis of right lower extremity: He was diagnosed with sepsis due to cellulitis on 06/07/2019.  He was hospitalized and was treated with vancomycin and cefepime.  He was discharged on 06/09/19 and was prescribed ciprofloxacin.  He has not followed up with wound care.  He was advised to schedule an appointment with wound care soon as possible.  He will continue holding Arava and Actemra infusions until he has been cleared to restart following her specialist.  Other medical conditions are listed as follows:   History of seizure disorder   History of CHF (congestive heart failure)  History of DVT (deep vein thrombosis)   History of adenomatous polyp of colon   History of COPD  History of obesity   History of prostate cancer   Orders: No orders of the defined types were placed in this encounter.  No orders of the defined types were placed in this encounter.    Follow-Up Instructions: Return in about 3 months (around 10/05/2019) for Rheumatoid arthritis, Gout, Osteoarthritis, Osteoporosis.   Ofilia Neas, PA-C I examined and evaluated the patient with Hazel Sams PA.  Patient is currently not having any synovitis as he has been off medications only for 3 weeks.  He states the wound on his right lower extremity is not healing.  He states he is canceled appointment with the wound center twice.  I urged him to schedule an appointment with the wound center as soon as possible.  He will need clearance from the wound center prior to restarting the  immunosuppressive agents.  The plan of  care was discussed as noted above.  Bo Merino, MD Note - This record has been created using Editor, commissioning.  Chart creation errors have been sought, but may not always  have been located. Such creation errors do not reflect on  the standard of medical care.

## 2019-06-26 ENCOUNTER — Other Ambulatory Visit: Payer: Self-pay | Admitting: Rheumatology

## 2019-06-27 ENCOUNTER — Other Ambulatory Visit: Payer: Self-pay

## 2019-06-27 NOTE — Patient Outreach (Signed)
[name]) Care Management  06/27/2019  [name] 1951/01/18 [id number]   Telephone Screen  Referral Date:6/12/20220 Referral Source:MD Office Referral Reason:"complex medical plan, recent discharge from ED, COPD" Insurance:Medicare   Multiple attempts to establish contact with patient without success. No response from letter mailed to patient. Case is being closed at this time.      Plan: RN CM will close case at this time.   [name], RN,BSN,CCM [facility] Telephonic Care Management Coordinator Direct Phone: [phone] Toll Free: [phone] Fax: [phone]

## 2019-06-28 DIAGNOSIS — I872 Venous insufficiency (chronic) (peripheral): Secondary | ICD-10-CM | POA: Diagnosis not present

## 2019-06-28 DIAGNOSIS — Z6832 Body mass index (BMI) 32.0-32.9, adult: Secondary | ICD-10-CM | POA: Diagnosis not present

## 2019-06-28 DIAGNOSIS — L03115 Cellulitis of right lower limb: Secondary | ICD-10-CM | POA: Diagnosis not present

## 2019-06-28 DIAGNOSIS — R600 Localized edema: Secondary | ICD-10-CM | POA: Diagnosis not present

## 2019-06-28 NOTE — Telephone Encounter (Signed)
Last Visit: 02/03/2019 Next Visit: 07/05/2019 Labs: 06/09/2019 WBC 11.3, RBC 4.15, hemoglobin 12.5, HCT 38.7, platelets 124  Okay to refill per Dr. Estanislado Pandy

## 2019-07-04 NOTE — Progress Notes (Signed)
@Patient  ID: [name], male    DOB: 1951/01/16, 68 y.o.   MRN: [id number]  Chief Complaint  Patient presents with   Follow-up    May need to start BiPAP therapy    Referring provider: [name], MD  HPI:   68 year old male former smoker followed in our office for COPD and severe obstructive sleep apnea Past medical history: GERD, nausea, chronic anticoagulation, rheumatoid arthritis (on Actemra), DVT, CHF, history of prostate cancer, Serratia sepsis (June/2020) Maintenance: Breo Ellipta 100, Incruse Ellipta Patient of: Dr. Chase Caller for pulmonary, Dr. Elsworth Soho for sleep  07/05/2019  - Visit   68 year old male former smoker followed in our office for COPD and severe affective sleep apnea.  Patient completed a tele-visit with our office 4 weeks ago that was a hospital discharge.  Patient was recently treated in the hospital for Serratia sepsis in June/2020 believed to be from right lower extremity wound.  He is still managed on ciprofloxacin for the leg infection.  Patient reports that primary care has been managing this.  Primary care has routed patient to a wound care center.  Patient is waiting to hear back from them.  Patient has planned follow-up with rheumatology later on today.  Patient has planned follow-up with cardiology next week.  Patient has lost 18 pounds over the last month since following a low-sodium diet as well as watching fluid intakes.  Patient reports adherence to Brio Ellipta inhaler as well as Incruse Ellipta inhaler.  He has not had to use his rescue inhaler often.  Patient continues to be compliant with using CPAP therapy and he feels that this does help him.  CPAP compliance report listed below:  06/01/2019-06/30/2019-CPAP compliance report-27 on last 30 days use, 24 those days greater than 4 hours, average usage 6 hours and 20 minutes, APAP set pressure 5-15, AHI 17, central apneas 4.9, 95th percentile 11.3  Patient completed CPAP titration in June/2020 that  recommended a trial of auto BiPAP.   Tests:   12/04/2018-CT chest without contrast-no acute cardiopulmonary abnormalities, diffuse bronchial wall thickening with emphysema, peripheral subpleural reticulation identified within the right lung which appears to be slightly progressive compared from previous study there is mild bronchiectasis  06/07/2019- venous lower Doppler -right- extensive post thrombin biotic change in the right femoral and popliteal veins and visualized calf veins, no definitive acute DVT  04/26/2019-Home sleep study- AHI 38.5 an hour, SaO2 low 68%, severe O2 drops, average O2 89%  06/03/2019-CPAP titration- optimal Pap pressure could not be selected, recommendations: Trial of auto BiPAP 6 to 18 cm with medium full facemask  02/08/2019-pulmonary function test- FVC 2.11 (48% predicted), postbronchodilator ratio 85, postbronchodilator FEV1 1.90 (59% predicted), no bronchodilator response, mid flow reversibility, DLCO 71  06/08/2019-echocardiogram-LV ejection fraction 55 to 60%  06/07/2019 - Sars COV 2 - negative    FENO:  No results found for: NITRICOXIDE  PFT: PFT Results Latest Ref Rng & Units 02/08/2019 11/26/2017  FVC-Pre L 2.11 2.16  FVC-Predicted Pre % 48 49  FVC-Post L 2.24 2.11  FVC-Predicted Post % 51 48  Pre FEV1/FVC % % 78 82  Post FEV1/FCV % % 85 84  FEV1-Pre L 1.65 1.77  FEV1-Predicted Pre % 51 54  FEV1-Post L 1.90 1.77  DLCO UNC% % 71 63  DLCO COR %Predicted % 115 119    Imaging: Dg Chest 2 View  Result Date: 06/07/2019 CLINICAL DATA:  Shortness of breath and fever EXAM: CHEST - 2 VIEW COMPARISON:  07/05/2017 FINDINGS: There  is elevation of the left diaphragm unchanged from the prior exams. There is no focal consolidation. There is no pleural effusion or pneumothorax. The heart and mediastinal contours are unremarkable. There is thoracic aortic atherosclerosis. The osseous structures are unremarkable. IMPRESSION: No active cardiopulmonary disease.  Electronically Signed   By: Kathreen Devoid   On: 06/07/2019 13:17   US Venous Img Lower Right (dvt Study)  Result Date: 06/07/2019 CLINICAL DATA:  Pain and edema EXAM: RIGHT LOWER EXTREMITY VENOUS DOPPLER ULTRASOUND TECHNIQUE: Gray-scale sonography with compression, as well as color and duplex ultrasound, were performed to evaluate the deep venous system from the level of the common femoral vein through the popliteal and proximal calf veins. COMPARISON:  03/15/2016 FINDINGS: Normal compressibility of the right common femoral vein, visualized profunda femoral vein, and saphenofemoral junction. There is wall thickening in relatively diminutive femoral and popliteal veins, incompletely compressible, with persistent flow signal identified on color Doppler. Monophasic waveforms. Small changes in the visualized calf veins, with flow signal on color Doppler. No definite acute DVT. Survey views of the contralateral left common femoral vein are unremarkable. IMPRESSION: A 1. Extensive post thrombotic change in the RIGHT femoral and popliteal veins and visualized calf veins. No definite acute DVT. For Electronically Signed   By: Lucrezia Europe M.D.   On: 06/07/2019 15:02      Specialty Problems      Pulmonary Problems   Chronic obstructive pulmonary disease (HCC)    02/08/2019-pulmonary function test- FVC 2.11 (48% predicted), postbronchodilator ratio 85, postbronchodilator FEV1 1.90 (59% predicted), no bronchodilator response, mid flow reversibility, DLCO 71      Dyspnea on exertion   OSA (obstructive sleep apnea)    04/26/2019-Home sleep study- AHI 38.5 an hour, SaO2 low 68%, severe O2 drops, average O2 89%  06/03/2019-CPAP titration- optimal Pap pressure could not be selected, recommendations: Trial of auto BiPAP 6 to 18 cm with medium full facemask          Allergies  Allergen Reactions   Orencia [Abatacept] Anaphylaxis    Had prostate cancer   Carbamazepine Rash    REACTION: makes drowsy BRAND  NAME IS TEGRETOL   Celecoxib Itching and Rash    BRAND NAME IS CELEBREX   Cephalexin Rash    "Severe Rash".  BRAND NAME IS KEFLEX.    Enbrel [Etanercept] Rash   Humira [Adalimumab] Rash   Levofloxacin Other (See Comments)    REACTION: GI Intolerance BRAND NAME IS LEVAQUIN   Sulfa Antibiotics Rash   Sulfasalazine Rash    Immunization History  Administered Date(s) Administered   Influenza, High Dose Seasonal PF 10/05/2018   Influenza,inj,Quad PF,6+ Mos 08/31/2015   Influenza-Unspecified 09/30/2016, 08/22/2017   Pneumococcal Conjugate-13 10/05/2018   Pneumococcal-Unspecified 12/30/2012    Past Medical History:  Diagnosis Date   Anxiety    hx of    Arthritis    RA   Asthma    Clotting disorder (HCC)    DVT both legs    COPD (chronic obstructive pulmonary disease) (HCC)    DDD (degenerative disc disease), cervical    with UE's paresthesias   Diverticulosis    DVT, lower extremity ([place])    bilat   Eye abnormality    right eye drifts has difficulty focusing 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 Unknown origin.   Shortness of breath dyspnea    exertion    Sleep apnea    not on cpap   Tubular adenoma of colon 04/2008    Tobacco History: Social History   Tobacco Use  Smoking Status Former Smoker   Packs/day: 3.00   Years: 20.00   Pack years: 60.00   Types: Cigarettes   Quit date: 12/30/1996   Years since quitting: 22.5  Smokeless Tobacco Never Used   Counseling given: Yes   Continue to not smoke  Outpatient Encounter Medications as of 07/05/2019  Medication Sig   acetaminophen (TYLENOL)  500 MG tablet Take 1,000 mg by mouth every 6 (six) hours as needed (arthritis pain).   albuterol (PROVENTIL) (2.5 MG/3ML) 0.083% nebulizer solution Take 3 mLs (2.5 mg total) by nebulization every 6 (six) hours as needed for wheezing or shortness of breath.   albuterol (PROVENTIL) 2 MG tablet Take 2 mg by mouth daily.    alendronate (FOSAMAX) 70 MG tablet TAKE 1 TABLET BY MOUTH ONCE A WEEK. TAKE WITH FULL GLASS OF WATER ON EMPTY STOMACH   allopurinol (ZYLOPRIM) 100 MG tablet TAKE 1 TABLET BY MOUTH TWICE A DAY   BREO ELLIPTA 100-25 MCG/INH AEPB INHALE 1 PUFF BY MOUTH EVERY DAY (Patient taking differently: Inhale 1 puff into the lungs at bedtime. )   Calcium-Phosphorus-Vitamin D (CITRACAL +D3 PO) Take 2 tablets by mouth 2 (two) times daily.    diclofenac sodium (VOLTAREN) 1 % GEL Apply 3 grams to 3 large joints up to 3 times daily (Patient taking differently: Apply 3 g topically 3 (three) times daily as needed (pain). Apply 3 grams to 3 large joints up to 3 times daily)   dicyclomine (BENTYL) 10 MG capsule TAKE 1 CAPSULE BY MOUTH 4 TIMES DAILY BEFORE MEALS AND AT BEDTIME (Patient taking differently: Take 20 mg by mouth 2 (two) times daily. )   folic acid (FOLVITE) 1 MG tablet TAKE 2 TABLETS BY MOUTH EVERY DAY IN THE MORNING (Patient taking differently: Take 2 mg by mouth daily. )   furosemide (LASIX) 40 MG tablet Take 1 tablet (40 mg total) by mouth 2 (two) times daily.   INCRUSE ELLIPTA 62.5 MCG/INH AEPB TAKE 1 PUFF BY MOUTH EVERY DAY (Patient taking differently: Inhale 1 puff into the lungs every morning. )   leflunomide (ARAVA) 20 MG tablet Take 1 tablet (20 mg total) by mouth daily. Hold for 7 days and then resume.   omeprazole (PRILOSEC) 40 MG capsule Take 40 mg by mouth at bedtime.    phenytoin (DILANTIN) 100 MG ER capsule Take 100-200 mg by mouth See admin instructions. Take one capsule in the morning and 2 capsules at bedtime   Potassium Chloride ER 20 MEQ TBCR Take 20 mEq by mouth  daily.    saccharomyces boulardii (FLORASTOR) 250 MG capsule Take 1 capsule (250 mg total) by mouth 2 (two) times daily.   TOVIAZ 8 MG TB24 tablet Take 8 mg by mouth daily.   warfarin (COUMADIN) 3 MG tablet Take 3 mg by mouth at bedtime.    Tocilizumab (ACTEMRA IV) Inject into the vein every 28 (twenty-eight) days. For Rheumatoid Arthritis   No facility-administered encounter medications on file as of 07/05/2019.  Review of Systems  Review of Systems  Constitutional: Negative for activity change, chills, fatigue, fever and unexpected weight change.  HENT: Negative for postnasal drip, rhinorrhea, sinus pressure, sinus pain, sneezing and sore throat.   Eyes: Negative.   Respiratory: Negative for cough, shortness of breath and wheezing.   Cardiovascular: Positive for leg swelling (Improving). Negative for chest pain and palpitations.  Gastrointestinal: Negative for constipation, diarrhea, nausea and vomiting.  Endocrine: Negative.   Musculoskeletal: Negative.   Skin: Positive for wound (Right lower extremity wound).  Neurological: Negative for dizziness and headaches.  Psychiatric/Behavioral: Negative.  Negative for dysphoric mood. The patient is not nervous/anxious.   All other systems reviewed and are negative.    Physical Exam  BP 134/64 (BP Location: Left Arm, Cuff Size: Normal)    Pulse 78    Temp 98 F (36.7 C)    Ht 5\' 8"  (1.727 m)    Wt 215 lb (97.5 kg)    SpO2 95%    BMI 32.69 kg/m   Wt Readings from Last 5 Encounters:  07/05/19 215 lb (97.5 kg)  06/21/19 220 lb (99.8 kg)  06/09/19 233 lb 4 oz (105.8 kg)  05/25/19 222 lb (100.7 kg)  04/26/19 220 lb (99.8 kg)     Physical Exam  Constitutional: He is oriented to person, place, and time and well-developed, well-nourished, and in no distress. No distress.  Chronically ill adult male  HENT:  Head: Normocephalic and atraumatic.  Right Ear: Hearing, tympanic membrane, external ear and ear canal normal.  Left Ear:  Hearing, tympanic membrane, external ear and ear canal normal.  Nose: Nose normal. Right sinus exhibits no maxillary sinus tenderness and no frontal sinus tenderness. Left sinus exhibits no maxillary sinus tenderness and no frontal sinus tenderness.  Mouth/Throat: Uvula is midline and oropharynx is clear and moist. No oropharyngeal exudate.  Eyes: Pupils are equal, round, and reactive to light.  Neck: Normal range of motion. Neck supple.  Cardiovascular: Normal rate and normal heart sounds. A regularly irregular rhythm present.  Pulmonary/Chest: Effort normal and breath sounds normal. No accessory muscle usage. No respiratory distress. He has no decreased breath sounds. He has no wheezes. He has no rhonchi.  Abdominal: Soft. Bowel sounds are normal. He exhibits no distension. There is no abdominal tenderness.  Musculoskeletal: Normal range of motion.        General: Edema (1+ le edema bilaterally) present.  Lymphadenopathy:    He has no cervical adenopathy.  Neurological: He is alert and oriented to person, place, and time. Gait normal.  Skin: Skin is warm and dry. He is not diaphoretic. No erythema.     Psychiatric: Mood, memory, affect and judgment normal.  Nursing note and vitals reviewed.     Lab Results:  CBC    Component Value Date/Time   WBC 11.3 (H) 06/09/2019 0407   RBC 4.15 (L) 06/09/2019 0407   HGB 12.5 (L) 06/09/2019 0407   HGB 14.7 11/25/2016 0824   HCT 38.7 (L) 06/09/2019 0407   HCT 43.4 11/25/2016 0824   PLT 124 (L) 06/09/2019 0407   PLT 213 11/25/2016 0824   MCV 93.3 06/09/2019 0407   MCV 87 11/25/2016 0824   MCH 30.1 06/09/2019 0407   MCHC 32.3 06/09/2019 0407   RDW 14.5 06/09/2019 0407   RDW 16.0 (H) 11/25/2016 0824   LYMPHSABS 0.8 06/09/2019 0407   LYMPHSABS 0.9 11/25/2016 0824   MONOABS 0.8 06/09/2019 0407   EOSABS 0.8 (H) 06/09/2019 0407   EOSABS  0.4 11/25/2016 0824   BASOSABS 0.1 06/09/2019 0407   BASOSABS 0.1 11/25/2016 0824    BMET      Component Value Date/Time   NA 140 06/09/2019 0407   NA 144 11/25/2016 0824   K 3.5 06/09/2019 0407   CL 107 06/09/2019 0407   CO2 25 06/09/2019 0407   GLUCOSE 108 (H) 06/09/2019 0407   BUN 13 06/09/2019 0407   BUN 13 11/25/2016 0824   CREATININE 1.10 06/09/2019 0407   CREATININE 1.24 03/18/2018 1107   CALCIUM 8.6 (L) 06/09/2019 0407   GFRNONAA >60 06/09/2019 0407   GFRNONAA 60 03/18/2018 1107   GFRAA >60 06/09/2019 0407   GFRAA 69 03/18/2018 1107    BNP    Component Value Date/Time   BNP 38.0 06/07/2019 1216    ProBNP    Component Value Date/Time   PROBNP 26.0 02/08/2019 1437      Assessment & Plan:   OSA (obstructive sleep apnea) Assessment: Severe obstructive sleep CPAP compliance report today shows adequate compliance, central apneas are present at 4.9, AHI is not controlled at 17.0 CPAP titration recommends BiPAP trial of auto BiPAP  Plan: Start BiPAP therapy Continue CPAP until started on BiPAP therapy Continue to work on diet and exercise  Chronic obstructive pulmonary disease ([place]) Plan: Continue Breo Ellipta and Incruse Ellipta Follow-up with Dr. Elsworth Soho in 2 months  Rheumatoid arthritis ([place]) Plan: Continue follow-up with rheumatology Continue Actemra infusions under the guidance of rheumatology  Chronic diastolic HF (heart failure) ([place]) Plan: Continue diuretics as prescribed Continue to weigh yourself daily Continue to elevate lower extremities Continue follow-up with cardiology and primary care    Return in about 2 months (around 09/05/2019), or if symptoms worsen or fail to improve, for Follow up with Dr. Elsworth Soho.   Lauraine Rinne, NP 07/05/2019   This appointment was 29 minutes long with over 50% of the time in direct face-to-face patient care, assessment, plan of care, and follow-up.

## 2019-07-05 ENCOUNTER — Encounter: Payer: Self-pay | Admitting: Pulmonary Disease

## 2019-07-05 ENCOUNTER — Other Ambulatory Visit: Payer: Self-pay | Admitting: *Deleted

## 2019-07-05 ENCOUNTER — Ambulatory Visit: Payer: Medicare Other

## 2019-07-05 ENCOUNTER — Ambulatory Visit (INDEPENDENT_AMBULATORY_CARE_PROVIDER_SITE_OTHER): Payer: Medicare Other | Admitting: Rheumatology

## 2019-07-05 ENCOUNTER — Other Ambulatory Visit: Payer: Self-pay

## 2019-07-05 ENCOUNTER — Ambulatory Visit (INDEPENDENT_AMBULATORY_CARE_PROVIDER_SITE_OTHER): Payer: Medicare Other | Admitting: Pulmonary Disease

## 2019-07-05 ENCOUNTER — Encounter: Payer: Self-pay | Admitting: Rheumatology

## 2019-07-05 VITALS — BP 134/64 | HR 78 | Temp 98.0°F | Ht 68.0 in | Wt 215.0 lb

## 2019-07-05 VITALS — BP 134/78 | HR 76 | Resp 15 | Ht 68.0 in | Wt 215.6 lb

## 2019-07-05 DIAGNOSIS — Z86718 Personal history of other venous thrombosis and embolism: Secondary | ICD-10-CM

## 2019-07-05 DIAGNOSIS — J449 Chronic obstructive pulmonary disease, unspecified: Secondary | ICD-10-CM | POA: Diagnosis not present

## 2019-07-05 DIAGNOSIS — M19042 Primary osteoarthritis, left hand: Secondary | ICD-10-CM

## 2019-07-05 DIAGNOSIS — M0579 Rheumatoid arthritis with rheumatoid factor of multiple sites without organ or systems involvement: Secondary | ICD-10-CM | POA: Diagnosis not present

## 2019-07-05 DIAGNOSIS — Z860101 Personal history of adenomatous and serrated colon polyps: Secondary | ICD-10-CM

## 2019-07-05 DIAGNOSIS — Z8679 Personal history of other diseases of the circulatory system: Secondary | ICD-10-CM | POA: Diagnosis not present

## 2019-07-05 DIAGNOSIS — M19071 Primary osteoarthritis, right ankle and foot: Secondary | ICD-10-CM

## 2019-07-05 DIAGNOSIS — Z8709 Personal history of other diseases of the respiratory system: Secondary | ICD-10-CM

## 2019-07-05 DIAGNOSIS — Z79899 Other long term (current) drug therapy: Secondary | ICD-10-CM | POA: Diagnosis not present

## 2019-07-05 DIAGNOSIS — L03115 Cellulitis of right lower limb: Secondary | ICD-10-CM

## 2019-07-05 DIAGNOSIS — M19041 Primary osteoarthritis, right hand: Secondary | ICD-10-CM

## 2019-07-05 DIAGNOSIS — M069 Rheumatoid arthritis, unspecified: Secondary | ICD-10-CM | POA: Diagnosis not present

## 2019-07-05 DIAGNOSIS — M170 Bilateral primary osteoarthritis of knee: Secondary | ICD-10-CM | POA: Diagnosis not present

## 2019-07-05 DIAGNOSIS — Z86010 Personal history of colonic polyps: Secondary | ICD-10-CM | POA: Diagnosis not present

## 2019-07-05 DIAGNOSIS — Z8546 Personal history of malignant neoplasm of prostate: Secondary | ICD-10-CM

## 2019-07-05 DIAGNOSIS — M810 Age-related osteoporosis without current pathological fracture: Secondary | ICD-10-CM

## 2019-07-05 DIAGNOSIS — R002 Palpitations: Secondary | ICD-10-CM

## 2019-07-05 DIAGNOSIS — Z8669 Personal history of other diseases of the nervous system and sense organs: Secondary | ICD-10-CM | POA: Diagnosis not present

## 2019-07-05 DIAGNOSIS — I5032 Chronic diastolic (congestive) heart failure: Secondary | ICD-10-CM | POA: Diagnosis not present

## 2019-07-05 DIAGNOSIS — M1A0790 Idiopathic chronic gout, unspecified ankle and foot, without tophus (tophi): Secondary | ICD-10-CM

## 2019-07-05 DIAGNOSIS — G4733 Obstructive sleep apnea (adult) (pediatric): Secondary | ICD-10-CM

## 2019-07-05 DIAGNOSIS — Z8639 Personal history of other endocrine, nutritional and metabolic disease: Secondary | ICD-10-CM

## 2019-07-05 DIAGNOSIS — M19072 Primary osteoarthritis, left ankle and foot: Secondary | ICD-10-CM

## 2019-07-05 NOTE — Assessment & Plan Note (Signed)
Plan: Continue diuretics as prescribed Continue to weigh yourself daily Continue to elevate lower extremities Continue follow-up with cardiology and primary care

## 2019-07-05 NOTE — Assessment & Plan Note (Signed)
Plan: Continue follow-up with rheumatology Continue Actemra infusions under the guidance of rheumatology

## 2019-07-05 NOTE — Patient Instructions (Addendum)
We will order a Bipap for you based on your titration study   Keep using CPAP until you start Bipap therapy    Breo Ellipta 100 >>> Take 1 puff daily in the morning right when you wake up >>>Rinse your mouth out after use >>>This is a daily maintenance inhaler, NOT a rescue inhaler >>>Contact our office if you are having difficulties affording or obtaining this medication >>>It is important for you to be able to take this daily and not miss any doses  Incruse Ellipta  >>> Take 1 puff daily in the morning right when you wake up >>>Rinse your mouth out after use >>>This is a daily maintenance inhaler, NOT a rescue inhaler >>>Contact our office if you are having difficulties affording or obtaining this medication >>>It is important for you to be able to take this daily and not miss any doses   Only use your albuterol as a rescue medication to be used if you can't catch your breath by resting or doing a relaxed purse lip breathing pattern.  - The less you use it, the better it will work when you need it. - Ok to use up to 2 puffs every 4 hours if you must but call for immediate appointment if use goes up over your usual need - Don't leave home without it !! (think of it like the spare tire for your car)    Keep scheduled follow-up with rheumatology 07/05/2019 @ 1045am Keep scheduled follow-up with cardiology 07/13/2019 @ 340pm   Return in about 2 months (around 09/05/2019), or if symptoms worsen or fail to improve, for Follow up with Dr. Elsworth Soho.   Coronavirus (COVID-19) Are you at risk?  Are you at risk for the Coronavirus (COVID-19)?  To be considered HIGH RISK for Coronavirus (COVID-19), you have to meet the following criteria:  . Traveled to [place], [place], [place], [place] or [place]; or in the [place] to [place], [place], [place], or [place]; and have fever, cough, and shortness of breath within the last 2 weeks of travel OR . Been in close contact with a  person diagnosed with COVID-19 within the last 2 weeks and have fever, cough, and shortness of breath . IF YOU DO NOT MEET THESE CRITERIA, YOU ARE CONSIDERED LOW RISK FOR COVID-19.  What to do if you are HIGH RISK for COVID-19?  [name] If you are having a medical emergency, call 911. . Seek medical care right away. Before you go to a doctor's office, urgent care or emergency department, call ahead and tell them about your recent travel, contact with someone diagnosed with COVID-19, and your symptoms. You should receive instructions from your physician's office regarding next steps of care.  . When you arrive at healthcare provider, tell the healthcare staff immediately you have returned from visiting [place], [place], [place], [place] or [place]; or traveled in the [place] to [place], [place], [place], or [place]; in the last two weeks or you have been in close contact with a person diagnosed with COVID-19 in the last 2 weeks.   . Tell the health care staff about your symptoms: fever, cough and shortness of breath. . After you have been seen by a medical provider, you will be either: o Tested for (COVID-19) and discharged home on quarantine except to seek medical care if symptoms worsen, and asked to  - Stay home and avoid contact with others until you get your results (4-5 days)  - Avoid travel  on public transportation if possible (such as bus, train, or airplane) or o Sent to the Emergency Department by EMS for evaluation, COVID-19 testing, and possible admission depending on your condition and test results.  What to do if you are LOW RISK for COVID-19?  Reduce your risk of any infection by using the same precautions used for avoiding the common cold or flu:  [name] Wash your hands often with soap and warm water for at least 20 seconds.  If soap and water are not readily available, use an alcohol-based hand sanitizer with at least 60% alcohol.  . If coughing or sneezing, cover your mouth and  nose by coughing or sneezing into the elbow areas of your shirt or coat, into a tissue or into your sleeve (not your hands). . Avoid shaking hands with others and consider head nods or verbal greetings only. . Avoid touching your eyes, nose, or mouth with unwashed hands.  . Avoid close contact with people who are sick. . Avoid places or events with large numbers of people in one location, like concerts or sporting events. . Carefully consider travel plans you have or are making. . If you are planning any travel outside or inside the [place], visit the CDC's Travelers' Health webpage for the latest health notices. . If you have some symptoms but not all symptoms, continue to monitor at home and seek medical attention if your symptoms worsen. . If you are having a medical emergency, call 911.   [place] / e-Visit: [email or web address]         [facility]: [facility]: [phone]                   [facility]: [phone]           It is flu season:   >>> Best ways to protect herself from the flu: Receive the yearly flu vaccine, practice good hand hygiene washing with soap and also using hand sanitizer when available, eat a nutritious meals, get adequate rest, hydrate appropriately   Please contact the office if your symptoms worsen or you have concerns that you are not improving.   Thank you for choosing [facility] for your healthcare, and for allowing [place] to partner with you on your healthcare journey. I am thankful to be able to provide care to you today.   Wyn Quaker FNP-C

## 2019-07-05 NOTE — Assessment & Plan Note (Signed)
Assessment: Severe obstructive sleep CPAP compliance report today shows adequate compliance, central apneas are present at 4.9, AHI is not controlled at 17.0 CPAP titration recommends BiPAP trial of auto BiPAP  Plan: Start BiPAP therapy Continue CPAP until started on BiPAP therapy Continue to work on diet and exercise

## 2019-07-05 NOTE — Assessment & Plan Note (Signed)
Plan: Continue Breo Ellipta and Incruse Ellipta Follow-up with Dr. Elsworth Soho in 2 months

## 2019-07-12 ENCOUNTER — Telehealth: Payer: Self-pay | Admitting: *Deleted

## 2019-07-12 ENCOUNTER — Telehealth: Payer: Self-pay | Admitting: Pulmonary Disease

## 2019-07-12 DIAGNOSIS — G4733 Obstructive sleep apnea (adult) (pediatric): Secondary | ICD-10-CM

## 2019-07-12 NOTE — Telephone Encounter (Signed)
I called and spoke with the patient and made him aware of the change to his CPAP and what the plans are going forward. I have placed an order to [facility] for setting change. Nothing further is needed.

## 2019-07-12 NOTE — Telephone Encounter (Signed)
Left a message for the patient to call back ton confirm his appointment for tomorrow.

## 2019-07-12 NOTE — Telephone Encounter (Signed)
07/12/2019 1415  Please contact the patient let him know that I have heard back from Dr. Elsworth Soho regarding his CPAP compliance as well as last office visit.  Dr. Elsworth Soho is suggesting that we keep the patient on CPAP therapy for right now and change his pressure settings.  See message listed below:  If tolerating autoCPAP & centrals are controlled , total AHI down, then does not need bipAP  Since total AHI higher, can increase autoCPAP to 10-18 & repeat DL to target <10  If unable to bring AHI to taht range, only then use bipap  RA   Please let the patient know.  Please place a prescription to change CPAP pressure settings to auto CPAP 10-18.  We can repeat a download in 2 months to check compliance.  Hopefully we will have the AHI below 10.  If AHI persists greater than 10 despite these changes then will need to transition to BiPAP.  Wyn Quaker, FNP

## 2019-07-13 ENCOUNTER — Telehealth (INDEPENDENT_AMBULATORY_CARE_PROVIDER_SITE_OTHER): Payer: Medicare Other | Admitting: Cardiovascular Disease

## 2019-07-13 DIAGNOSIS — A4153 Sepsis due to Serratia: Secondary | ICD-10-CM

## 2019-07-13 DIAGNOSIS — M069 Rheumatoid arthritis, unspecified: Secondary | ICD-10-CM | POA: Diagnosis not present

## 2019-07-13 DIAGNOSIS — G4733 Obstructive sleep apnea (adult) (pediatric): Secondary | ICD-10-CM

## 2019-07-13 DIAGNOSIS — I50812 Chronic right heart failure: Secondary | ICD-10-CM

## 2019-07-13 DIAGNOSIS — I824Y9 Acute embolism and thrombosis of unspecified deep veins of unspecified proximal lower extremity: Secondary | ICD-10-CM

## 2019-07-13 DIAGNOSIS — J449 Chronic obstructive pulmonary disease, unspecified: Secondary | ICD-10-CM | POA: Diagnosis not present

## 2019-07-13 DIAGNOSIS — Z7901 Long term (current) use of anticoagulants: Secondary | ICD-10-CM | POA: Diagnosis not present

## 2019-07-13 DIAGNOSIS — R7881 Bacteremia: Secondary | ICD-10-CM | POA: Diagnosis not present

## 2019-07-13 DIAGNOSIS — I87099 Postthrombotic syndrome with other complications of unspecified lower extremity: Secondary | ICD-10-CM

## 2019-07-13 NOTE — Progress Notes (Signed)
Virtual Visit via Video Note   This visit type was conducted due to national recommendations for restrictions regarding the COVID-19 Pandemic (e.g. social distancing) in an effort to limit this patient's exposure and mitigate transmission in our community.  Due to his co-morbid illnesses, this patient is at least at moderate risk for complications without adequate follow up.  This format is felt to be most appropriate for this patient at this time.  All issues noted in this document were discussed and addressed.  A limited physical exam was performed with this format.  Please refer to the patient's chart for his consent to telehealth for [facility].   Date:  07/13/2019   ID:  [name], DOB 1951-01-26, MRN [id number]  Patient Location: Home Provider Location: Office  PCP:  [name], MD  Cardiologist:  [name], MD  Electrophysiologist:  None   Evaluation Performed:  Follow-Up Visit  Chief Complaint:  edema  History of Present Illness:    [name] is a 68 y.o. male with postphlebitic syndrome, recently complicated by cellulitis of the right leg.  He has had bilateral DVTs over the years and about a month ago developed burning and pain in his right calf, followed by a bright red rash and fever.  He required hospitalization for Serratia bacteremia.  The portal for infection seems to be a small open wound on the medial surface of his right calf.  His rheumatoid arthritis medications Actemra and Arava have been on hold since then.  He has not yet completed oral antibiotic therapy  He has been going to the wound center but believes he will be shortly released from them since the wound is almost completely healed.  He has chronic dyspnea related to COPD that is well controlled with bronchodilators (Breo at night, Incruse in the morning).  He is 100% compliance with CPAP.  He denies any problems with chest tightness, palpitations, syncope, dizziness, claudication.  He  does have some degree of cor pulmonale/right heart failure which contributes to the edema and responds well to diuretics.  He has mild hypercholesterolemia with mildly elevated LDL but also with a very high HDL cholesterol level.  He does not have known coronary or peripheral vascular disease.  He is on chronic warfarin anticoagulation without serious bleeding complications, monitored in [facility].  He had normal perfusion on a nuclear stress test in March 2020.  LVEF was reported at 48%, suspected to be artificially low due to diaphragmatic interference.  An echocardiogram performed in June 9 during his recent hospitalization showed a left ventricular ejection fraction of 55-60% and normal diastolic function, no significant valvular abnormalities.  The patient does not have symptoms concerning for COVID-19 infection (fever, chills, cough, or new shortness of breath).    Past Medical History:  Diagnosis Date  . Anxiety    hx of   . Arthritis    RA  . Asthma   . Clotting disorder (HCC)    DVT both legs   . COPD (chronic obstructive pulmonary disease) ([place])   . DDD (degenerative disc disease), cervical    with UE's paresthesias  . Diverticulosis   . DVT, lower extremity ([place])    bilat  . 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 GERD (gastroesophageal reflux disease)   . Gout   . Heart murmur   . History of measles   . History of shingles   .  Hypercholesterolemia   . IBS (irritable bowel syndrome)   . IBS (irritable bowel syndrome)   . Peripheral edema   . Pneumonia    hx of   . Prostate cancer ([place])   . Seizures ([place])    last seizure 20 years ago; on dilantin. Unknown origin.  [name] Shortness of breath dyspnea    exertion   . Sleep apnea    not on cpap  . Tubular adenoma of colon 04/2008   Past Surgical History:  Procedure Laterality Date  . CHOLECYSTECTOMY    . COLONOSCOPY    . CYSTOSCOPY WITH LITHOLAPAXY  N/A 03/17/2019   Procedure: CYSTOSCOPY WITH LITHOLAPAXY AND REMOVAL OF FOREIGN BODY;  Surgeon: [name], MD;  Location: [facility];  Service: Urology;  Laterality: N/A;  . DOPPLER ECHOCARDIOGRAPHY N/A 01-21-2012   TECHNICALLY DIFFICULT. MILD CONCENTRIC LV HYPERTROPHY. LV CAVITY IS SMALL.. EF=> 55%. TRANSMITRAL SPECTRAL FLOW PATTREN IS SUGGESTIVE OF IMPAIRED LV RELAXATION. RV SYSTOLIC PRESSURE IS 62IRSW. LEFT ATRIAL SIZE IS NORMAL. AV APPEARS MILDLY SCLEROTIC. NO SIGN VALVE DISEASE NOTED.  [name] LYMPHADENECTOMY Bilateral 08/30/2015   Procedure: PELVIC LYMPHADENECTOMY;  Surgeon: [name], MD;  Location: [facility];  Service: Urology;  Laterality: Bilateral;  . NUCLEAR STRESS TEST N/A 01-21-2012   NORMAL PATTERN OF PERFUSION IN ALL REGIONS. POST STRESS LV SIZE IS NORMAL. NO EVIDENCE OF INDUCIBLE ISCHEMIA. EF 54%.  [name] POLYPECTOMY    . PROSTATE BIOPSY    . ROBOT ASSISTED LAPAROSCOPIC RADICAL PROSTATECTOMY N/A 08/30/2015   Procedure: ROBOTIC ASSISTED LAPAROSCOPIC RADICAL PROSTATECTOMY;  Surgeon: [name], MD;  Location: [facility];  Service: Urology;  Laterality: N/A;  . US VENOUS LOWER EXT Right 03/07/11   PERSISTENT DVT IN RIGHT LOWER EXT.WITH PERSISTANT VISUALIZATION OF HYPOECHOIC THROMBUS WITHIN THE FEMORAL, PROFUNDA FEMORAL AND POPLITEAL VEINS. WHEN COMPARED TO PREVIOUS, CLOT IS NO LONGER IDENTIFIED WITH IN THE RIGHT CFV.     Current Meds  Medication Sig  . acetaminophen (TYLENOL) 500 MG tablet Take 1,000 mg by mouth every 6 (six) hours as needed (arthritis pain).  [name] albuterol (PROVENTIL) (2.5 MG/3ML) 0.083% nebulizer solution Take 3 mLs (2.5 mg total) by nebulization every 6 (six) hours as needed for wheezing or shortness of breath.  [name] albuterol (PROVENTIL) 2 MG tablet Take 2 mg by mouth daily.   [name] alendronate (FOSAMAX) 70 MG tablet TAKE 1 TABLET BY MOUTH ONCE A WEEK. TAKE WITH FULL GLASS OF WATER ON EMPTY STOMACH  . allopurinol (ZYLOPRIM) 100 MG tablet TAKE 1 TABLET BY MOUTH TWICE A DAY   . BREO ELLIPTA 100-25 MCG/INH AEPB INHALE 1 PUFF BY MOUTH EVERY DAY (Patient taking differently: Inhale 1 puff into the lungs at bedtime. )  . Calcium-Phosphorus-Vitamin D (CITRACAL +D3 PO) Take 2 tablets by mouth 2 (two) times daily.   . ciprofloxacin (CIPRO) 500 MG tablet Take 500 mg by mouth 2 (two) times daily.  . diclofenac sodium (VOLTAREN) 1 % GEL Apply 3 grams to 3 large joints up to 3 times daily (Patient taking differently: Apply 3 g topically 3 (three) times daily as needed (pain). Apply 3 grams to 3 large joints up to 3 times daily)  . dicyclomine (BENTYL) 10 MG capsule TAKE 1 CAPSULE BY MOUTH 4 TIMES DAILY BEFORE MEALS AND AT BEDTIME (Patient taking differently: Take 20 mg by mouth 2 (two) times daily. )  . folic acid (FOLVITE) 1 MG tablet TAKE 2 TABLETS BY MOUTH EVERY DAY IN THE MORNING (Patient taking differently: Take 2 mg by  mouth daily. )  . furosemide (LASIX) 40 MG tablet Take 1 tablet (40 mg total) by mouth 2 (two) times daily.  . INCRUSE ELLIPTA 62.5 MCG/INH AEPB TAKE 1 PUFF BY MOUTH EVERY DAY (Patient taking differently: Inhale 1 puff into the lungs every morning. )  . leflunomide (ARAVA) 20 MG tablet Take 1 tablet (20 mg total) by mouth daily. Hold for 7 days and then resume.  [name] omeprazole (PRILOSEC) 40 MG capsule Take 40 mg by mouth at bedtime.   . phenytoin (DILANTIN) 100 MG ER capsule Take 100-200 mg by mouth See admin instructions. Take one capsule in the morning and 2 capsules at bedtime  . Potassium Chloride ER 20 MEQ TBCR Take 20 mEq by mouth daily.   [name] saccharomyces boulardii (FLORASTOR) 250 MG capsule Take 1 capsule (250 mg total) by mouth 2 (two) times daily.  . Tocilizumab (ACTEMRA IV) Inject into the vein every 28 (twenty-eight) days. For Rheumatoid Arthritis  . warfarin (COUMADIN) 3 MG tablet Take 3 mg by mouth at bedtime.      Allergies:   Orencia [abatacept], Carbamazepine, Celecoxib, Cephalexin, Enbrel [etanercept], Humira [adalimumab], Levofloxacin, Sulfa  antibiotics, and Sulfasalazine   Social History   Tobacco Use  . Smoking status: Former Smoker    Packs/day: 3.00    Years: 20.00    Pack years: 60.00    Types: Cigarettes    Quit date: 12/30/1996    Years since quitting: 22.5  . Smokeless tobacco: Never Used  Substance Use Topics  . Alcohol use: No    Alcohol/week: 0.0 standard drinks  . Drug use: No     Family Hx: The patient's family history includes Alzheimer's disease in his mother; Bladder Cancer in his sister; Breast cancer in his sister; Colon cancer in his brother; Heart attack in his brother, brother, father, and sister; Heart murmur in his sister; Lung cancer in his brother; Skin cancer in his father; Stomach cancer in his brother and father; Stroke in his sister; Throat cancer in his brother. There is no history of Colon polyps.  ROS:   Please see the history of present illness.    All other systems reviewed and are negative.   Prior CV studies:   The following studies were reviewed today:  Nuclear stress test March 2020, echocardiogram June 2020  Labs/Other Tests and Data Reviewed:    EKG:  An ECG dated 06/07/2019 was personally reviewed today and demonstrated:  Sinus rhythm, early RS transition in lead I-V2 suggestive of left ventricular hypertrophy, nonspecific diffuse ST segment changes  Recent Labs: 02/08/2019: Pro B Natriuretic peptide (BNP) 26.0 06/07/2019: B Natriuretic Peptide 38.0; Magnesium 1.7 06/09/2019: ALT 20; BUN 13; Creatinine, Ser 1.10; Hemoglobin 12.5; Platelets 124; Potassium 3.5; Sodium 140   Recent Lipid Panel No results found for: CHOL, TRIG, HDL, CHOLHDL, LDLCALC, LDLDIRECT  Wt Readings from Last 3 Encounters:  07/13/19 212 lb (96.2 kg)  07/05/19 215 lb 9.6 oz (97.8 kg)  07/05/19 215 lb (97.5 kg)     Objective:    Vital Signs:  Ht 5\' 8"  (1.727 m)   Wt 212 lb (96.2 kg)   BMI 32.23 kg/m    VITAL SIGNS:  reviewed GEN:  no acute distress EYES:  sclerae anicteric, EOMI - Extraocular  Movements Intact RESPIRATORY:  normal respiratory effort, symmetric expansion CARDIOVASCULAR:  Prominent varicose veins in both legs, mild ankle swelling, small healing ulceration medial surface of right lower half of calf SKIN:  Some residual patchy erythema of the right  calf MUSCULOSKELETAL:  no obvious deformities. NEURO:  alert and oriented x 3, no obvious focal deficit PSYCH:  normal affect  ASSESSMENT & PLAN:    1. Peripheral venous insufficiency: Postphlebitic syndrome after bilateral DVTs.  Also has some degree of right heart failure.  Keep legs elevated.  Wear compression stockings.  Follow wound care center instructions.  Keeping the swelling under control is important to avoid recurrent infection. 2. Postphlebitic syndrome/history of DVT: Lifelong anticoagulation.  3. RA: Responded well to Actemra and Arava, medications currently on hold following his infection.  4. COPD: Responds well to bronchodilators.  5. OSA: Compliant with CPAP.  6. RHF/chronic cor pulmonale: As far as I can tell with the limits of a visual examination only, he appears to be euvolemic. 7. Anticoagulation: Monitored by Dr. Hilma Favors.  COVID-19 Education: The signs and symptoms of COVID-19 were discussed with the patient and how to seek care for testing (follow up with PCP or arrange E-visit).  The importance of social distancing was discussed today.  Time:   Today, I have spent 21 minutes with the patient with telehealth technology discussing the above problems.     Medication Adjustments/Labs and Tests Ordered: Current medicines are reviewed at length with the patient today.  Concerns regarding medicines are outlined above.   Tests Ordered: No orders of the defined types were placed in this encounter.   Medication Changes: No orders of the defined types were placed in this encounter.   Follow Up:  Virtual Visit or In Person 1 year  Signed, [name], MD  07/13/2019 4:15 PM    [place]

## 2019-07-13 NOTE — Patient Instructions (Signed)

## 2019-07-16 ENCOUNTER — Encounter (HOSPITAL_BASED_OUTPATIENT_CLINIC_OR_DEPARTMENT_OTHER): Payer: Medicare Other | Attending: Internal Medicine

## 2019-07-16 DIAGNOSIS — Z87891 Personal history of nicotine dependence: Secondary | ICD-10-CM | POA: Insufficient documentation

## 2019-07-16 DIAGNOSIS — J449 Chronic obstructive pulmonary disease, unspecified: Secondary | ICD-10-CM | POA: Insufficient documentation

## 2019-07-16 DIAGNOSIS — M069 Rheumatoid arthritis, unspecified: Secondary | ICD-10-CM | POA: Diagnosis not present

## 2019-07-16 DIAGNOSIS — G4733 Obstructive sleep apnea (adult) (pediatric): Secondary | ICD-10-CM | POA: Diagnosis not present

## 2019-07-16 DIAGNOSIS — I87301 Chronic venous hypertension (idiopathic) without complications of right lower extremity: Secondary | ICD-10-CM | POA: Diagnosis not present

## 2019-07-16 DIAGNOSIS — Z8546 Personal history of malignant neoplasm of prostate: Secondary | ICD-10-CM | POA: Insufficient documentation

## 2019-07-16 DIAGNOSIS — Z86718 Personal history of other venous thrombosis and embolism: Secondary | ICD-10-CM | POA: Diagnosis not present

## 2019-07-16 DIAGNOSIS — I890 Lymphedema, not elsewhere classified: Secondary | ICD-10-CM | POA: Insufficient documentation

## 2019-07-16 DIAGNOSIS — I509 Heart failure, unspecified: Secondary | ICD-10-CM | POA: Insufficient documentation

## 2019-07-16 DIAGNOSIS — I872 Venous insufficiency (chronic) (peripheral): Secondary | ICD-10-CM | POA: Diagnosis not present

## 2019-07-16 DIAGNOSIS — Z7901 Long term (current) use of anticoagulants: Secondary | ICD-10-CM | POA: Diagnosis not present

## 2019-07-16 DIAGNOSIS — R600 Localized edema: Secondary | ICD-10-CM | POA: Diagnosis not present

## 2019-07-19 ENCOUNTER — Telehealth: Payer: Self-pay | Admitting: Rheumatology

## 2019-07-19 ENCOUNTER — Encounter (HOSPITAL_COMMUNITY): Payer: Medicare Other

## 2019-07-19 NOTE — Telephone Encounter (Signed)
attempted to contact patient and left message on machine to advise patient to contact the office.

## 2019-07-19 NOTE — Telephone Encounter (Signed)
Patient stated he has completed his antibiotics and his wounds have healed and is ready to his reschedule IV infusion.  Patient is requesting a return call.

## 2019-07-21 ENCOUNTER — Telehealth: Payer: Self-pay | Admitting: Pulmonary Disease

## 2019-07-21 MED ORDER — INCRUSE ELLIPTA 62.5 MCG/INH IN AEPB: 1.0000 | INHALATION_SPRAY | Freq: Every day | RESPIRATORY_TRACT | 2 refills | Status: DC

## 2019-07-21 NOTE — Telephone Encounter (Signed)
Called and spoke with Patient.  Patient requested refill of Incruse to [facility].  Prescription sent to requested pharmacy.  Nothing further at this time.

## 2019-07-21 NOTE — Telephone Encounter (Signed)
Patient advised we will need clearance letter in order to restart infusions.

## 2019-07-23 ENCOUNTER — Telehealth: Payer: Self-pay | Admitting: *Deleted

## 2019-07-23 ENCOUNTER — Other Ambulatory Visit: Payer: Self-pay | Admitting: *Deleted

## 2019-07-23 NOTE — Telephone Encounter (Signed)
Patient advised we have received clearance letter from wound care center. Patient advised he has been cleared to restart Actemra and Arava. Patient  Advised orders are in and he may call and schedule infusion.

## 2019-07-23 NOTE — Progress Notes (Signed)
Infusion orders are current for patient CBC CMP Tylenol Benadryl appointments are up to date and follow up appointment  is scheduled TB gold not due yet.  

## 2019-07-30 ENCOUNTER — Other Ambulatory Visit: Payer: Self-pay | Admitting: *Deleted

## 2019-07-30 NOTE — Progress Notes (Signed)
Infusion orders are current for patient CBC CMP Tylenol Benadryl appointments are up to date and follow up appointment  is scheduled TB gold not due yet.  

## 2019-08-03 ENCOUNTER — Other Ambulatory Visit: Payer: Self-pay

## 2019-08-03 ENCOUNTER — Ambulatory Visit (HOSPITAL_COMMUNITY)
Admission: RE | Admit: 2019-08-03 | Discharge: 2019-08-03 | Disposition: A | Discharge status: Home / Self Care | Payer: Medicare Other | Source: Ambulatory Visit | Attending: Rheumatology | Admitting: Rheumatology

## 2019-08-03 DIAGNOSIS — M0579 Rheumatoid arthritis with rheumatoid factor of multiple sites without organ or systems involvement: Secondary | ICD-10-CM | POA: Diagnosis not present

## 2019-08-03 LAB — COMPREHENSIVE METABOLIC PANEL
ALT: 19 U/L (ref 0–44)
AST: 31 U/L (ref 15–41)
Albumin: 3.2 g/dL — ABNORMAL LOW (ref 3.5–5.0)
Alkaline Phosphatase: 115 U/L (ref 38–126)
Anion gap: 11 (ref 5–15)
BUN: 12 mg/dL (ref 8–23)
CO2: 28 mmol/L (ref 22–32)
Calcium: 8.9 mg/dL (ref 8.9–10.3)
Chloride: 99 mmol/L (ref 98–111)
Creatinine, Ser: 1.12 mg/dL (ref 0.61–1.24)
GFR calc Af Amer: >60 mL/min (ref >60)
GFR calc non Af Amer: >60 mL/min (ref >60)
Glucose, Bld: 118 mg/dL — ABNORMAL HIGH (ref 70–99)
Potassium: 5 mmol/L (ref 3.5–5.1)
Sodium: 138 mmol/L (ref 135–145)
Total Bilirubin: 1.3 mg/dL — ABNORMAL HIGH (ref 0.3–1.2)
Total Protein: 6.6 g/dL (ref 6.5–8.1)

## 2019-08-03 LAB — CBC
HCT: 44.9 % (ref 39.0–52.0)
Hemoglobin: 14.6 g/dL (ref 13.0–17.0)
MCH: 30 pg (ref 26.0–34.0)
MCHC: 32.5 g/dL (ref 30.0–36.0)
MCV: 92.2 fL (ref 80.0–100.0)
Platelets: 239 10*3/uL (ref 150–400)
RBC: 4.87 MIL/uL (ref 4.22–5.81)
RDW: 13.2 % (ref 11.5–15.5)
WBC: 8.6 10*3/uL (ref 4.0–10.5)
nRBC: 0 % (ref 0.0–0.2)

## 2019-08-03 MED ORDER — ACETAMINOPHEN 325 MG PO TABS: 650.0000 mg | ORAL_TABLET | ORAL | Status: DC

## 2019-08-03 MED ORDER — TOCILIZUMAB 400 MG/20ML IV SOLN
4.0000 mg/kg | INTRAVENOUS | Status: DC
  Administered 2019-08-03: 384 mg via INTRAVENOUS
  Filled 2019-08-03: qty 19.2

## 2019-08-03 MED ORDER — DIPHENHYDRAMINE HCL 25 MG PO CAPS: 25.0000 mg | ORAL_CAPSULE | ORAL | Status: DC

## 2019-08-03 NOTE — Progress Notes (Signed)
Labs are stable.

## 2019-08-22 ENCOUNTER — Other Ambulatory Visit: Payer: Self-pay | Admitting: Rheumatology

## 2019-08-23 NOTE — Telephone Encounter (Signed)
Last Visit: 07/05/19 Next Visit: 10/11/19 Labs: 08/03/19 Stable  Okay to refill per Dr. Estanislado Pandy

## 2019-08-31 ENCOUNTER — Other Ambulatory Visit: Payer: Self-pay

## 2019-08-31 ENCOUNTER — Ambulatory Visit (HOSPITAL_COMMUNITY)
Admission: RE | Admit: 2019-08-31 | Discharge: 2019-08-31 | Disposition: A | Discharge status: Home / Self Care | Payer: Medicare Other | Source: Ambulatory Visit | Attending: Rheumatology | Admitting: Rheumatology

## 2019-08-31 DIAGNOSIS — M0579 Rheumatoid arthritis with rheumatoid factor of multiple sites without organ or systems involvement: Secondary | ICD-10-CM | POA: Diagnosis not present

## 2019-08-31 MED ORDER — ACETAMINOPHEN 325 MG PO TABS: 650.0000 mg | ORAL_TABLET | ORAL | Status: DC

## 2019-08-31 MED ORDER — TOCILIZUMAB 400 MG/20ML IV SOLN
4.0000 mg/kg | INTRAVENOUS | Status: DC
  Administered 2019-08-31: 400 mg via INTRAVENOUS
  Filled 2019-08-31: qty 20

## 2019-08-31 MED ORDER — DIPHENHYDRAMINE HCL 25 MG PO CAPS: 25.0000 mg | ORAL_CAPSULE | ORAL | Status: DC

## 2019-09-05 ENCOUNTER — Other Ambulatory Visit: Payer: Self-pay | Admitting: Physician Assistant

## 2019-09-07 NOTE — Telephone Encounter (Signed)
Last Visit: 07/05/19 Next Visit: 10/11/19 Labs: 08/03/19 Stable  Okay to refill per Dr. Estanislado Pandy

## 2019-09-14 ENCOUNTER — Other Ambulatory Visit: Payer: Self-pay | Admitting: Rheumatology

## 2019-09-14 NOTE — Telephone Encounter (Signed)
Last Visit: 07/05/19 Next Visit: 10/11/19 Labs: 08/03/19 Stable  Okay to refill per Dr. Estanislado Pandy

## 2019-09-24 ENCOUNTER — Other Ambulatory Visit: Payer: Self-pay | Admitting: *Deleted

## 2019-09-24 NOTE — Progress Notes (Signed)
Infusion orders are current for patient CBC CMP Tylenol Benadryl appointments are up to date and follow up appointment  is scheduled TB gold not due yet.  

## 2019-09-27 ENCOUNTER — Other Ambulatory Visit: Payer: Self-pay | Admitting: *Deleted

## 2019-09-27 DIAGNOSIS — M810 Age-related osteoporosis without current pathological fracture: Secondary | ICD-10-CM

## 2019-09-27 NOTE — Progress Notes (Signed)
bon

## 2019-09-27 NOTE — Progress Notes (Signed)
Office Visit Note  Patient: [name]             Date of Birth: 17-May-1951           MRN: [id number]             PCP: Mark Sites, MD Referring: Mark Sites, MD Visit Date: 10/11/2019 Occupation: @GUAROCC @  Subjective:  Pain in both hands    History of Present Illness: [name] is a 68 y.o. male with history of seropositive rheumatoid arthritis, osteoarthritis, gout, and osteoporosis.  Patient is on Actemra IV infusions every 28 days.  The last infusion was on 09/28/2019.  He continues take Arava 20 mg by mouth daily.  He has not missed any doses of these medications and is tolerating both medications.  He denies any recent rheumatoid arthritis flares.  He is experiencing pain in both hands currently but denies any joint stiffness or joint swelling.  He is attributing the increased discomfort to recent rainy weather.  He experiences intermittent neck pain and stiffness.  He denies any symptoms of radiculopathy.  He denies any recent gout flares.  He continues to take allopurinol 100 mg BID.  He also is taking fosamax as prescribed.    Activities of Daily Living:  Patient reports morning stiffness for several hours.   Patient Denies nocturnal pain.  Difficulty dressing/grooming: Denies Difficulty climbing stairs: Denies Difficulty getting out of chair: Denies Difficulty using hands for taps, buttons, cutlery, and/or writing: Denies  Review of Systems  Constitutional: Positive for fatigue. Negative for night sweats.  HENT: Negative for mouth sores, mouth dryness and nose dryness.   Eyes: Negative for redness, itching and dryness.  Respiratory: Positive for shortness of breath. Negative for cough, hemoptysis and difficulty breathing.        Due to COPD  Cardiovascular: Negative for chest pain, palpitations, hypertension, irregular heartbeat and swelling in legs/feet.  Gastrointestinal: Positive for blood in stool and constipation. Negative for abdominal pain and  diarrhea.  Endocrine: Negative for increased urination.  Genitourinary: Negative for painful urination.  Musculoskeletal: Positive for arthralgias, joint pain and morning stiffness. Negative for joint swelling, myalgias, muscle weakness, muscle tenderness and myalgias.  Skin: Negative for color change, rash, hair loss, nodules/bumps, skin tightness, ulcers and sensitivity to sunlight.  Allergic/Immunologic: Negative for susceptible to infections.  Neurological: Negative for dizziness, fainting, light-headedness, headaches, memory loss and night sweats.  Hematological: Negative for swollen glands.  Psychiatric/Behavioral: Negative for depressed mood, confusion and sleep disturbance. The patient is not nervous/anxious.     PMFS History:  Patient Active Problem List   Diagnosis Date Noted  . Class 2 severe obesity due to excess calories with serious comorbidity and body mass index (BMI) of 35.0 to 35.9 in adult [facility])   . Chronic diastolic HF (heart failure) ([place])   . Serratia sepsis ([place]) 06/08/2019  . Bacteremia due to Gram-negative bacteria 06/08/2019  . Sepsis due to cellulitis ([place]) 06/07/2019  . Voice hoarseness 04/26/2019  . OSA (obstructive sleep apnea) 03/10/2019  . Abnormal CT of the chest 02/09/2019  . Dyspnea on exertion 10/22/2017  . History of seizure disorder 02/27/2017  . Primary osteoarthritis of both hands 02/27/2017  . Primary osteoarthritis of both feet 02/27/2017  . Idiopathic gout of multiple [name] 02/27/2017  . High risk medication use 12/29/2016  . History of prostate cancer 12/29/2016  . Primary osteoarthritis of both knees 12/29/2016  . Chronic idiopathic gout involving toe without tophus 12/29/2016  . History of  CHF (congestive heart failure) 12/29/2016  . History of COPD 12/29/2016  . Plantar pustular psoriasis 12/29/2016  . DVT (deep venous thrombosis) ([place]) 10/31/2016  . Chronic obstructive pulmonary disease ([place]) 10/31/2016  . CHF (congestive heart  failure) ([place]) 10/31/2016  . Prostate cancer ([place]) 08/30/2015  . Malignant neoplasm of prostate ([place]) 07/04/2015  . Chest pain at rest 07/05/2014  . Weakness 07/05/2014  . Complicated postphlebitic syndrome 11/16/2013  . Personal history of DVT (deep vein thrombosis) 05/18/2013  . Chronic anticoagulation 05/18/2013  . Diverticulosis of colon without hemorrhage 05/18/2013  . Rheumatoid arthritis ([place]) 05/18/2013  . Seizure disorder ([place]) 05/18/2013  . Hx of adenomatous colonic polyps 05/18/2013  . GERD 05/08/2010  . NAUSEA 05/08/2010  . FLATULENCE-GAS-BLOATING 05/08/2010    Past Medical History:  Diagnosis Date  . Anxiety    hx of   . Arthritis    RA  . Asthma   . Clotting disorder (HCC)    DVT both legs   . COPD (chronic obstructive pulmonary disease) ([place])   . DDD (degenerative disc disease), cervical    with UE's paresthesias  . Diverticulosis   . DVT, lower extremity ([place])    bilat  . 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 GERD (gastroesophageal reflux disease)   . Gout   . Heart murmur   . History of measles   . History of shingles   . Hypercholesterolemia   . IBS (irritable bowel syndrome)   . IBS (irritable bowel syndrome)   . Peripheral edema   . Pneumonia    hx of   . Prostate cancer ([place])   . Seizures ([place])    last seizure 20 years ago; on dilantin. Unknown origin.  [name] Shortness of breath dyspnea    exertion   . Sleep apnea    not on cpap  . Tubular adenoma of colon 04/2008    Family History  Problem Relation Age of Onset  . Skin cancer Father   . Stomach cancer Father   . Heart attack Father   . Alzheimer's disease Mother   . Throat cancer Brother   . Stomach cancer Brother   . Lung cancer Brother   . Heart attack Brother   . Heart attack Brother   . Breast cancer Sister   . Heart attack Sister   . Stroke Sister   . Bladder Cancer Sister   . Heart murmur Sister   . Colon cancer  Brother   . Colon polyps Neg Hx    Past Surgical History:  Procedure Laterality Date  . CHOLECYSTECTOMY    . COLONOSCOPY    . CYSTOSCOPY WITH LITHOLAPAXY N/A 03/17/2019   Procedure: CYSTOSCOPY WITH LITHOLAPAXY AND REMOVAL OF FOREIGN BODY;  Surgeon: [name], MD;  Location: [facility];  Service: Urology;  Laterality: N/A;  . DOPPLER ECHOCARDIOGRAPHY N/A 01-21-2012   TECHNICALLY DIFFICULT. MILD CONCENTRIC LV HYPERTROPHY. LV CAVITY IS SMALL.. EF=> 55%. TRANSMITRAL SPECTRAL FLOW PATTREN IS SUGGESTIVE OF IMPAIRED LV RELAXATION. RV SYSTOLIC PRESSURE IS [id number]. LEFT ATRIAL SIZE IS NORMAL. AV APPEARS MILDLY SCLEROTIC. NO SIGN VALVE DISEASE NOTED.  [name] LYMPHADENECTOMY Bilateral 08/30/2015   Procedure: PELVIC LYMPHADENECTOMY;  Surgeon: [name], MD;  Location: [facility];  Service: Urology;  Laterality: Bilateral;  . NUCLEAR STRESS TEST N/A 01-21-2012   NORMAL PATTERN OF PERFUSION IN ALL REGIONS. POST STRESS LV SIZE IS NORMAL. NO EVIDENCE OF INDUCIBLE ISCHEMIA. EF  54%.  [name] POLYPECTOMY    . PROSTATE BIOPSY    . ROBOT ASSISTED LAPAROSCOPIC RADICAL PROSTATECTOMY N/A 08/30/2015   Procedure: ROBOTIC ASSISTED LAPAROSCOPIC RADICAL PROSTATECTOMY;  Surgeon: [name], MD;  Location: [facility];  Service: Urology;  Laterality: N/A;  . US VENOUS LOWER EXT Right 03/07/11   PERSISTENT DVT IN RIGHT LOWER EXT.WITH PERSISTANT VISUALIZATION OF HYPOECHOIC THROMBUS WITHIN THE FEMORAL, PROFUNDA FEMORAL AND POPLITEAL VEINS. WHEN COMPARED TO PREVIOUS, CLOT IS NO LONGER IDENTIFIED WITH IN THE RIGHT CFV.   Social History   Social History Narrative  . Not on file   Immunization History  Administered Date(s) Administered  . Fluad Quad(high Dose 65+) 09/30/2019  . Influenza, High Dose Seasonal PF 10/05/2018  . Influenza,inj,Quad PF,6+ Mos 08/31/2015  . Influenza-Unspecified 09/30/2016, 08/22/2017  . Pneumococcal Conjugate-13 10/05/2018  . Pneumococcal-Unspecified 12/30/2012     Objective: Vital Signs: BP  (!) 165/93 (BP Location: Left Arm, Patient Position: Sitting, Cuff Size: Normal)   Pulse 66   Resp 16   Ht 5\' 8"  (1.727 m)   Wt 222 lb 3.2 oz (100.8 kg)   BMI 33.79 kg/m    Physical Exam Vitals signs and nursing note reviewed.  Constitutional:      Appearance: He is well-developed.  HENT:     Head: Normocephalic and atraumatic.  Eyes:     Conjunctiva/sclera: Conjunctivae normal.     Pupils: Pupils are equal, round, and reactive to light.  Neck:     Musculoskeletal: Normal range of motion and neck supple.  Cardiovascular:     Rate and Rhythm: Normal rate and regular rhythm.     Heart sounds: Normal heart sounds.  Pulmonary:     Effort: Pulmonary effort is normal.     Breath sounds: Normal breath sounds.  Abdominal:     General: Bowel sounds are normal.     Palpations: Abdomen is soft.  Skin:    General: Skin is warm and dry.     Capillary Refill: Capillary refill takes less than 2 seconds.  Neurological:     Mental Status: He is alert and oriented to person, place, and time.  Psychiatric:        Behavior: Behavior normal.      Musculoskeletal Exam: C-spine limited ROM with lateral rotation.  Thoracic and lumbar spine good ROM.  No midline spinal tenderness.  No SI joint tenderness.  Shoulder joints, elbow joints, wrist joints, MCPs, PIPs, and DIPs good ROM with no synovitis.  Complete fist formation bilaterally.  Hip joints, knee joints, ankle joints, MTPs, PIPs, and DIPs good ROM with no synovitis.  No warmth or effusion of knee joints.  Pedal edema bilaterally.    CDAI Exam: CDAI Score: 1.4  Patient Global: 2 mm; Provider Global: 2 mm Swollen: 0 ; Tender: 1  Joint Exam      Right  Left  PIP 3      Tender     Investigation: No additional findings.  Imaging: No results found.  Recent Labs: Lab Results  Component Value Date   WBC 8.6 08/03/2019   HGB 14.6 08/03/2019   PLT 239 08/03/2019   NA 138 08/03/2019   K 5.0 08/03/2019   CL 99 08/03/2019   CO2 28  08/03/2019   GLUCOSE 118 (H) 08/03/2019   BUN 12 08/03/2019   CREATININE 1.12 08/03/2019   BILITOT 1.3 (H) 08/03/2019   ALKPHOS 115 08/03/2019   AST 31 08/03/2019   ALT 19 08/03/2019   PROT 6.6 08/03/2019  ALBUMIN 3.2 (L) 08/03/2019   CALCIUM 8.9 08/03/2019   GFRAA >60 08/03/2019   QFTBGOLDPLUS Negative 02/22/2019    Speciality Comments: ACTEMRA 4mg /kg x 4 weeks PPD negative 01/02/17  Procedures:  No procedures performed Allergies: Orencia [abatacept], Carbamazepine, Celecoxib, Cephalexin, Enbrel [etanercept], Humira [adalimumab], Levofloxacin, Sulfa antibiotics, and Sulfasalazine     Assessment / Plan:     Visit Diagnoses: Rheumatoid arthritis involving multiple [name] with positive rheumatoid factor ([place]): He has no synovitis on exam.  He has not had any recent rheumatoid arthritis flares.  He has tenderness at the left third PIP joint.  He has been experiencing increased pain in both hands for the past 2 to 3 days which he attributes to weather changes.  He has no other joint pain or joint swelling at this time.  He is clinically doing well on Actemra 40 mg IV infusions every 28 days and Arava 20 mg 1 tablet by mouth daily.  His last infusion was on 09/28/2019.  He has not missed any doses of these medications recently.  He is tolerating Arava without any side effects.  He will continue on this current treatment regimen.  He does not need any refills at this time.  He was advised to notify us if he develops increased joint pain or joint swelling.  He will follow-up in the office in 5 months.  High risk medication use -Actemra IV 400 mg every 28 days (last infusion on 09/28/2019) and Arava 20 mg 1 tablet daily.  Last TB gold negative on 02/22/2019 and will monitor yearly.  Most recent CBC/CMP within normal limits on 08/03/2019.  Chronic idiopathic gout involving toe without tophus, unspecified laterality -He has not had any recent gout flares.  He is clinically doing well on allopurinol 200  mg by mouth daily.  He does not need any refills at this time.  He has lab work drawn with his infusions.  A future order for uric acid level is in place.   Primary osteoarthritis of both hands: He has PIP and DIP synovial thickening consistent with osteoarthritis of both hands.  He has tenderness of the left third PIP joint but no inflammation was noted.  He has complete fist formation bilaterally.  He is experiencing increased pain in both hands which he attributes to recent weather changes.  Joint protection and muscle strengthening were discussed.  Primary osteoarthritis of both knees: He has good range of motion of bilateral knee joints.  No warmth or effusion was noted.  Primary osteoarthritis of both feet: He has no discomfort in his feet at this time.  He wears proper fitting shoes.  Age-related osteoporosis without current pathological fracture: He is on Fosamax 70 mg weekly (started in October 2018).  Last DEXA on 09/28/2019 T-score -2.1 right femur neck with +12 % change in BMD.  We will repeat DEXA in 2 years.  We will consider a drug holiday at that time.  Other medical conditions are listed as follows:   History of seizure disorder  History of CHF (congestive heart failure)  History of DVT (deep vein thrombosis)  History of adenomatous polyp of colon  History of COPD  History of prostate cancer  Orders: No orders of the defined types were placed in this encounter.  No orders of the defined types were placed in this encounter.     Follow-Up Instructions: Return in about 5 months (around 03/10/2020) for Rheumatoid arthritis, Gout, Osteoarthritis.   Ofilia Neas, PA-C   I examined and  evaluated the patient with Hazel Sams PA.  Patient is clinically doing well and had no synovitis on examination.  He denies any gout flare.  We will continue to monitor his labs closely as he is on high-dose prescription.  The plan of care was discussed as noted above.  Bo Merino, MD  Note - This record has been created using Editor, commissioning.  Chart creation errors have been sought, but may not always  have been located. Such creation errors do not reflect on  the standard of medical care.

## 2019-09-28 ENCOUNTER — Encounter: Payer: Self-pay | Admitting: Rheumatology

## 2019-09-28 ENCOUNTER — Other Ambulatory Visit: Payer: Self-pay

## 2019-09-28 ENCOUNTER — Ambulatory Visit (HOSPITAL_COMMUNITY)
Admission: RE | Admit: 2019-09-28 | Discharge: 2019-09-28 | Disposition: A | Discharge status: Home / Self Care | Payer: Medicare Other | Source: Ambulatory Visit | Attending: Rheumatology | Admitting: Rheumatology

## 2019-09-28 DIAGNOSIS — R29890 Loss of height: Secondary | ICD-10-CM | POA: Diagnosis not present

## 2019-09-28 DIAGNOSIS — M069 Rheumatoid arthritis, unspecified: Secondary | ICD-10-CM | POA: Diagnosis not present

## 2019-09-28 DIAGNOSIS — M0579 Rheumatoid arthritis with rheumatoid factor of multiple sites without organ or systems involvement: Secondary | ICD-10-CM | POA: Diagnosis not present

## 2019-09-28 DIAGNOSIS — C61 Malignant neoplasm of prostate: Secondary | ICD-10-CM | POA: Diagnosis not present

## 2019-09-28 DIAGNOSIS — G40909 Epilepsy, unspecified, not intractable, without status epilepticus: Secondary | ICD-10-CM | POA: Diagnosis not present

## 2019-09-28 DIAGNOSIS — M8589 Other specified disorders of bone density and structure, multiple sites: Secondary | ICD-10-CM | POA: Diagnosis not present

## 2019-09-28 DIAGNOSIS — K589 Irritable bowel syndrome without diarrhea: Secondary | ICD-10-CM | POA: Diagnosis not present

## 2019-09-28 MED ORDER — ACETAMINOPHEN 325 MG PO TABS: 650.0000 mg | ORAL_TABLET | ORAL | Status: DC

## 2019-09-28 MED ORDER — TOCILIZUMAB 400 MG/20ML IV SOLN
400.0000 mg | INTRAVENOUS | Status: AC
  Administered 2019-09-28: 400 mg via INTRAVENOUS
  Filled 2019-09-28: qty 20

## 2019-09-28 MED ORDER — DIPHENHYDRAMINE HCL 25 MG PO CAPS: 25.0000 mg | ORAL_CAPSULE | ORAL | Status: DC

## 2019-09-30 ENCOUNTER — Ambulatory Visit (INDEPENDENT_AMBULATORY_CARE_PROVIDER_SITE_OTHER): Payer: Medicare Other | Admitting: Pulmonary Disease

## 2019-09-30 ENCOUNTER — Other Ambulatory Visit: Payer: Self-pay

## 2019-09-30 ENCOUNTER — Encounter: Payer: Self-pay | Admitting: Pulmonary Disease

## 2019-09-30 DIAGNOSIS — Z23 Encounter for immunization: Secondary | ICD-10-CM | POA: Diagnosis not present

## 2019-09-30 DIAGNOSIS — G4733 Obstructive sleep apnea (adult) (pediatric): Secondary | ICD-10-CM

## 2019-09-30 DIAGNOSIS — J432 Centrilobular emphysema: Secondary | ICD-10-CM | POA: Diagnosis not present

## 2019-09-30 NOTE — Progress Notes (Signed)
   Subjective:    Patient ID: [name], male    DOB: [birth year], 68 y.o.   MRN: [id number]  HPI  68 year old male former smoker followed in our office for COPD and severe obstructive sleep apnea Past medical history: GERD, nausea, chronic anticoagulation, rheumatoid arthritis (on Actemra), DVT, CHF, history of prostate cancer, Serratia sepsis (June/2020)    Chief Complaint  Patient presents with  . OSA (obstructive sleep apnea)   After home sleep study in April which showed severe OSA with AHI of 38/hour he was set up with auto CPAP with full facemask. On his follow-up visit 06/2019, download was reviewed which showed residual AHI of 17/hour with central apneas 4.9/hour and good compliance.  He was on auto CPAP 5-15 and we increased pressure settings to 10 to 18 cm.  On his visit today, he reports feeling better rested although some nights are better than others.  He does not have a leak he says but the straps seem to take down into his head and he wonders if he can get those changed.  Denies any dryness of mouth or problems with pressure. Download was reviewed again which shows residual AHI of 12/hour with central events only 2.3/hour and mostly hypopneas 8/hour on auto settings 10 to 18 cm with average pressure of 12.5 cm and maximum pressure of 14 cm His compliance was good at more than 6 hours per night on average  Breathing is doing good on a regimen of Breo and Incruse.  Denies wheezing or sputum production  Significant tests/ events reviewed  CT chest in December showed emphysema, mild bronchiectasis and peripheral subpleural reticulation within right lung base which appears slightly progressive.   PFTs2/09/2019- FVC 2.24 (51%), FEV1 1.90 (59%), ratio 85, DLCOunc 71  PFTs 11/26/2017 - FVC 2.11 (48%), FEV1 1.77 (54%), ratio 84, DLCOcor 63  04/26/2019-Home sleep study- AHI 38.5 an hour, SaO2 low 68%, severe O2 drops, average O2 89%  06/03/2019-CPAP titration- optimal  Pap pressure could not be selected, recommendations: Trial of auto BiPAP 6 to 18 cm with medium full facemask  02/08/2019-pulmonary function test- FVC 2.11 (48% predicted), postbronchodilator ratio 85, postbronchodilator FEV1 1.90 (59% predicted), no bronchodilator response, mid flow reversibility, DLCO 71  Review of Systems neg for any significant sore throat, dysphagia, itching, sneezing, nasal congestion or excess/ purulent secretions, fever, chills, sweats, unintended wt loss, pleuritic or exertional cp, hempoptysis, orthopnea pnd or change in chronic leg swelling. Also denies presyncope, palpitations, heartburn, abdominal pain, nausea, vomiting, diarrhea or change in bowel or urinary habits, dysuria,hematuria, rash, arthralgias, visual complaints, headache, numbness weakness or ataxia.     Objective:   Physical Exam  Gen. Pleasant, obese, in no distress ENT - no lesions, no post nasal drip Neck: No JVD, no thyromegaly, no carotid bruits Lungs: no use of accessory muscles, no dullness to percussion, decreased without rales or rhonchi  Cardiovascular: Rhythm regular, heart sounds  normal, no murmurs or gallops, no peripheral edema Musculoskeletal: No deformities, no cyanosis or clubbing , no tremors       Assessment & Plan:

## 2019-09-30 NOTE — Assessment & Plan Note (Signed)
Symptomatically improved on his current regimen of Breo and Incruse. High-dose flu shot today

## 2019-09-30 NOTE — Addendum Note (Signed)
Addended by: Amado Coe on: 09/30/2019 11:23 AM   Modules accepted: Orders

## 2019-09-30 NOTE — Assessment & Plan Note (Signed)
He still has residual mostly hypopneas on auto settings 10 to 18 cm. We will tighten his pressure settings and change it to 12 to 15 cm auto and follow this up with a download in 1 month We will also has DME to provide him with new straps  Weight loss encouraged, compliance with goal of at least 4-6 hrs every night is the expectation. Advised against medications with sedative side effects Cautioned against driving when sleepy - understanding that sleepiness will vary on a day to day basis

## 2019-09-30 NOTE — Patient Instructions (Addendum)
Change auto CPAP settings to 12 to 15 cm and repeat download in 1 month DME to provide new straps for his mask  High-dose flu shot today

## 2019-10-04 ENCOUNTER — Telehealth: Payer: Self-pay | Admitting: *Deleted

## 2019-10-04 NOTE — Telephone Encounter (Signed)
Received DEXA results from [facility].  Date of Scan: 09/28/19 Lowest T-score and site measured:Right Femoral Neck Significant changes in BMD and site measured (5% and above): 12% 0.639 Right Femoral NEck  Current Regimen: On Fosamax 70 mg weekly since 09/29/2017  Recommendation: Continue current treatment. Repeat Dexa in 2 years. Plan drug holiday after years.

## 2019-10-05 NOTE — Telephone Encounter (Signed)
Patient advised of results and recommendations.  

## 2019-10-11 ENCOUNTER — Encounter: Payer: Self-pay | Admitting: Rheumatology

## 2019-10-11 ENCOUNTER — Other Ambulatory Visit: Payer: Self-pay

## 2019-10-11 ENCOUNTER — Ambulatory Visit (INDEPENDENT_AMBULATORY_CARE_PROVIDER_SITE_OTHER): Payer: Medicare Other | Admitting: Rheumatology

## 2019-10-11 VITALS — BP 165/93 | HR 66 | Resp 16 | Ht 68.0 in | Wt 222.2 lb

## 2019-10-11 DIAGNOSIS — Z86010 Personal history of colonic polyps: Secondary | ICD-10-CM

## 2019-10-11 DIAGNOSIS — Z8669 Personal history of other diseases of the nervous system and sense organs: Secondary | ICD-10-CM | POA: Diagnosis not present

## 2019-10-11 DIAGNOSIS — Z8679 Personal history of other diseases of the circulatory system: Secondary | ICD-10-CM

## 2019-10-11 DIAGNOSIS — M19042 Primary osteoarthritis, left hand: Secondary | ICD-10-CM

## 2019-10-11 DIAGNOSIS — M1A0790 Idiopathic chronic gout, unspecified ankle and foot, without tophus (tophi): Secondary | ICD-10-CM | POA: Diagnosis not present

## 2019-10-11 DIAGNOSIS — Z8709 Personal history of other diseases of the respiratory system: Secondary | ICD-10-CM | POA: Diagnosis not present

## 2019-10-11 DIAGNOSIS — M810 Age-related osteoporosis without current pathological fracture: Secondary | ICD-10-CM

## 2019-10-11 DIAGNOSIS — Z79899 Other long term (current) drug therapy: Secondary | ICD-10-CM

## 2019-10-11 DIAGNOSIS — M19041 Primary osteoarthritis, right hand: Secondary | ICD-10-CM

## 2019-10-11 DIAGNOSIS — M19071 Primary osteoarthritis, right ankle and foot: Secondary | ICD-10-CM | POA: Diagnosis not present

## 2019-10-11 DIAGNOSIS — Z86718 Personal history of other venous thrombosis and embolism: Secondary | ICD-10-CM | POA: Diagnosis not present

## 2019-10-11 DIAGNOSIS — M19072 Primary osteoarthritis, left ankle and foot: Secondary | ICD-10-CM

## 2019-10-11 DIAGNOSIS — Z8546 Personal history of malignant neoplasm of prostate: Secondary | ICD-10-CM

## 2019-10-11 DIAGNOSIS — M0579 Rheumatoid arthritis with rheumatoid factor of multiple sites without organ or systems involvement: Secondary | ICD-10-CM

## 2019-10-11 DIAGNOSIS — M170 Bilateral primary osteoarthritis of knee: Secondary | ICD-10-CM

## 2019-10-20 ENCOUNTER — Other Ambulatory Visit: Payer: Self-pay | Admitting: Pulmonary Disease

## 2019-10-25 ENCOUNTER — Other Ambulatory Visit (HOSPITAL_COMMUNITY): Payer: Self-pay | Admitting: *Deleted

## 2019-10-26 ENCOUNTER — Ambulatory Visit (HOSPITAL_COMMUNITY)
Admission: RE | Admit: 2019-10-26 | Discharge: 2019-10-26 | Disposition: A | Discharge status: Home / Self Care | Payer: Medicare Other | Source: Ambulatory Visit | Attending: Rheumatology | Admitting: Rheumatology

## 2019-10-26 ENCOUNTER — Other Ambulatory Visit: Payer: Self-pay

## 2019-10-26 DIAGNOSIS — M0579 Rheumatoid arthritis with rheumatoid factor of multiple sites without organ or systems involvement: Secondary | ICD-10-CM | POA: Diagnosis not present

## 2019-10-26 LAB — COMPREHENSIVE METABOLIC PANEL
ALT: 25 U/L (ref 0–44)
AST: 29 U/L (ref 15–41)
Albumin: 3.6 g/dL (ref 3.5–5.0)
Alkaline Phosphatase: 60 U/L (ref 38–126)
Anion gap: 12 (ref 5–15)
BUN: 12 mg/dL (ref 8–23)
CO2: 28 mmol/L (ref 22–32)
Calcium: 9 mg/dL (ref 8.9–10.3)
Chloride: 101 mmol/L (ref 98–111)
Creatinine, Ser: 1.25 mg/dL — ABNORMAL HIGH (ref 0.61–1.24)
GFR calc Af Amer: >60 mL/min (ref >60)
GFR calc non Af Amer: 59 mL/min — ABNORMAL LOW (ref >60)
Glucose, Bld: 129 mg/dL — ABNORMAL HIGH (ref 70–99)
Potassium: 3.4 mmol/L — ABNORMAL LOW (ref 3.5–5.1)
Sodium: 141 mmol/L (ref 135–145)
Total Bilirubin: 0.7 mg/dL (ref 0.3–1.2)
Total Protein: 6.2 g/dL — ABNORMAL LOW (ref 6.5–8.1)

## 2019-10-26 LAB — CBC
HCT: 44.4 % (ref 39.0–52.0)
Hemoglobin: 14.6 g/dL (ref 13.0–17.0)
MCH: 29.8 pg (ref 26.0–34.0)
MCHC: 32.9 g/dL (ref 30.0–36.0)
MCV: 90.6 fL (ref 80.0–100.0)
Platelets: 180 10*3/uL (ref 150–400)
RBC: 4.9 MIL/uL (ref 4.22–5.81)
RDW: 14.4 % (ref 11.5–15.5)
WBC: 6.2 10*3/uL (ref 4.0–10.5)
nRBC: 0 % (ref 0.0–0.2)

## 2019-10-26 MED ORDER — TOCILIZUMAB 400 MG/20ML IV SOLN
4.0000 mg/kg | INTRAVENOUS | Status: DC
  Administered 2019-10-26: 400 mg via INTRAVENOUS
  Filled 2019-10-26: qty 20

## 2019-10-26 MED ORDER — ACETAMINOPHEN 325 MG PO TABS: 650.0000 mg | ORAL_TABLET | Freq: Once | ORAL | Status: DC

## 2019-10-26 MED ORDER — DIPHENHYDRAMINE HCL 25 MG PO CAPS: 25.0000 mg | ORAL_CAPSULE | Freq: Once | ORAL | Status: DC

## 2019-10-26 NOTE — Progress Notes (Signed)
Creatinine is mildly elevated.  Patient is on Lasix.  Please forward labs to the PCP.

## 2019-10-29 ENCOUNTER — Other Ambulatory Visit: Payer: Self-pay | Admitting: Internal Medicine

## 2019-11-08 ENCOUNTER — Other Ambulatory Visit: Payer: Self-pay | Admitting: *Deleted

## 2019-11-08 DIAGNOSIS — Z6833 Body mass index (BMI) 33.0-33.9, adult: Secondary | ICD-10-CM | POA: Diagnosis not present

## 2019-11-08 DIAGNOSIS — Z0001 Encounter for general adult medical examination with abnormal findings: Secondary | ICD-10-CM | POA: Diagnosis not present

## 2019-11-08 DIAGNOSIS — G40309 Generalized idiopathic epilepsy and epileptic syndromes, not intractable, without status epilepticus: Secondary | ICD-10-CM | POA: Diagnosis not present

## 2019-11-08 DIAGNOSIS — Z1389 Encounter for screening for other disorder: Secondary | ICD-10-CM | POA: Diagnosis not present

## 2019-11-08 DIAGNOSIS — R7309 Other abnormal glucose: Secondary | ICD-10-CM | POA: Diagnosis not present

## 2019-11-08 DIAGNOSIS — E6609 Other obesity due to excess calories: Secondary | ICD-10-CM | POA: Diagnosis not present

## 2019-11-08 DIAGNOSIS — I82409 Acute embolism and thrombosis of unspecified deep veins of unspecified lower extremity: Secondary | ICD-10-CM | POA: Diagnosis not present

## 2019-11-08 DIAGNOSIS — J449 Chronic obstructive pulmonary disease, unspecified: Secondary | ICD-10-CM | POA: Diagnosis not present

## 2019-11-08 DIAGNOSIS — Z7984 Long term (current) use of oral hypoglycemic drugs: Secondary | ICD-10-CM | POA: Diagnosis not present

## 2019-11-08 DIAGNOSIS — J302 Other seasonal allergic rhinitis: Secondary | ICD-10-CM | POA: Diagnosis not present

## 2019-11-16 ENCOUNTER — Other Ambulatory Visit: Payer: Self-pay

## 2019-11-16 ENCOUNTER — Encounter (HOSPITAL_COMMUNITY): Payer: Self-pay

## 2019-11-16 ENCOUNTER — Emergency Department (HOSPITAL_COMMUNITY): Payer: Medicare Other

## 2019-11-16 ENCOUNTER — Inpatient Hospital Stay (HOSPITAL_COMMUNITY)
Admission: EM | Admit: 2019-11-16 | Discharge: 2019-11-23 | DRG: 177 | Disposition: A | Discharge status: Home / Self Care | Payer: Medicare Other | Attending: Internal Medicine | Admitting: Internal Medicine

## 2019-11-16 DIAGNOSIS — Z9079 Acquired absence of other genital organ(s): Secondary | ICD-10-CM | POA: Diagnosis not present

## 2019-11-16 DIAGNOSIS — Z820 Family history of epilepsy and other diseases of the nervous system: Secondary | ICD-10-CM

## 2019-11-16 DIAGNOSIS — M1990 Unspecified osteoarthritis, unspecified site: Secondary | ICD-10-CM | POA: Diagnosis present

## 2019-11-16 DIAGNOSIS — Z888 Allergy status to other drugs, medicaments and biological substances status: Secondary | ICD-10-CM | POA: Diagnosis not present

## 2019-11-16 DIAGNOSIS — J1289 Other viral pneumonia: Secondary | ICD-10-CM | POA: Diagnosis present

## 2019-11-16 DIAGNOSIS — J441 Chronic obstructive pulmonary disease with (acute) exacerbation: Secondary | ICD-10-CM

## 2019-11-16 DIAGNOSIS — J9621 Acute and chronic respiratory failure with hypoxia: Secondary | ICD-10-CM | POA: Diagnosis present

## 2019-11-16 DIAGNOSIS — Z823 Family history of stroke: Secondary | ICD-10-CM

## 2019-11-16 DIAGNOSIS — I130 Hypertensive heart and chronic kidney disease with heart failure and stage 1 through stage 4 chronic kidney disease, or unspecified chronic kidney disease: Secondary | ICD-10-CM | POA: Diagnosis present

## 2019-11-16 DIAGNOSIS — Z808 Family history of malignant neoplasm of other organs or systems: Secondary | ICD-10-CM

## 2019-11-16 DIAGNOSIS — G40909 Epilepsy, unspecified, not intractable, without status epilepticus: Secondary | ICD-10-CM

## 2019-11-16 DIAGNOSIS — M5030 Other cervical disc degeneration, unspecified cervical region: Secondary | ICD-10-CM | POA: Diagnosis present

## 2019-11-16 DIAGNOSIS — F419 Anxiety disorder, unspecified: Secondary | ICD-10-CM | POA: Diagnosis present

## 2019-11-16 DIAGNOSIS — Z7901 Long term (current) use of anticoagulants: Secondary | ICD-10-CM

## 2019-11-16 DIAGNOSIS — R0902 Hypoxemia: Secondary | ICD-10-CM

## 2019-11-16 DIAGNOSIS — Z882 Allergy status to sulfonamides status: Secondary | ICD-10-CM

## 2019-11-16 DIAGNOSIS — I5032 Chronic diastolic (congestive) heart failure: Secondary | ICD-10-CM

## 2019-11-16 DIAGNOSIS — Z9981 Dependence on supplemental oxygen: Secondary | ICD-10-CM

## 2019-11-16 DIAGNOSIS — R202 Paresthesia of skin: Secondary | ICD-10-CM | POA: Diagnosis present

## 2019-11-16 DIAGNOSIS — Z87891 Personal history of nicotine dependence: Secondary | ICD-10-CM

## 2019-11-16 DIAGNOSIS — K5790 Diverticulosis of intestine, part unspecified, without perforation or abscess without bleeding: Secondary | ICD-10-CM | POA: Diagnosis present

## 2019-11-16 DIAGNOSIS — J9601 Acute respiratory failure with hypoxia: Secondary | ICD-10-CM | POA: Diagnosis not present

## 2019-11-16 DIAGNOSIS — Z8052 Family history of malignant neoplasm of bladder: Secondary | ICD-10-CM

## 2019-11-16 DIAGNOSIS — M109 Gout, unspecified: Secondary | ICD-10-CM | POA: Diagnosis present

## 2019-11-16 DIAGNOSIS — K921 Melena: Secondary | ICD-10-CM | POA: Diagnosis present

## 2019-11-16 DIAGNOSIS — Z8249 Family history of ischemic heart disease and other diseases of the circulatory system: Secondary | ICD-10-CM

## 2019-11-16 DIAGNOSIS — Z801 Family history of malignant neoplasm of trachea, bronchus and lung: Secondary | ICD-10-CM

## 2019-11-16 DIAGNOSIS — N183 Chronic kidney disease, stage 3 unspecified: Secondary | ICD-10-CM | POA: Diagnosis present

## 2019-11-16 DIAGNOSIS — R7401 Elevation of levels of liver transaminase levels: Secondary | ICD-10-CM | POA: Diagnosis present

## 2019-11-16 DIAGNOSIS — Z800 Family history of malignant neoplasm of digestive organs: Secondary | ICD-10-CM

## 2019-11-16 DIAGNOSIS — G4733 Obstructive sleep apnea (adult) (pediatric): Secondary | ICD-10-CM | POA: Diagnosis present

## 2019-11-16 DIAGNOSIS — K219 Gastro-esophageal reflux disease without esophagitis: Secondary | ICD-10-CM | POA: Diagnosis present

## 2019-11-16 DIAGNOSIS — Z803 Family history of malignant neoplasm of breast: Secondary | ICD-10-CM

## 2019-11-16 DIAGNOSIS — U071 COVID-19: Secondary | ICD-10-CM | POA: Diagnosis not present

## 2019-11-16 DIAGNOSIS — Z86718 Personal history of other venous thrombosis and embolism: Secondary | ICD-10-CM

## 2019-11-16 DIAGNOSIS — C61 Malignant neoplasm of prostate: Secondary | ICD-10-CM | POA: Diagnosis present

## 2019-11-16 DIAGNOSIS — M069 Rheumatoid arthritis, unspecified: Secondary | ICD-10-CM

## 2019-11-16 DIAGNOSIS — R079 Chest pain, unspecified: Secondary | ICD-10-CM | POA: Diagnosis not present

## 2019-11-16 DIAGNOSIS — J189 Pneumonia, unspecified organism: Secondary | ICD-10-CM | POA: Diagnosis not present

## 2019-11-16 DIAGNOSIS — Z8546 Personal history of malignant neoplasm of prostate: Secondary | ICD-10-CM | POA: Diagnosis not present

## 2019-11-16 DIAGNOSIS — J440 Chronic obstructive pulmonary disease with acute lower respiratory infection: Secondary | ICD-10-CM | POA: Diagnosis present

## 2019-11-16 DIAGNOSIS — R05 Cough: Secondary | ICD-10-CM | POA: Diagnosis not present

## 2019-11-16 DIAGNOSIS — K648 Other hemorrhoids: Secondary | ICD-10-CM | POA: Diagnosis present

## 2019-11-16 DIAGNOSIS — R0602 Shortness of breath: Secondary | ICD-10-CM | POA: Diagnosis not present

## 2019-11-16 DIAGNOSIS — Z79899 Other long term (current) drug therapy: Secondary | ICD-10-CM

## 2019-11-16 HISTORY — DX: Essential (primary) hypertension: I10

## 2019-11-16 HISTORY — DX: Heart failure, unspecified: I50.9

## 2019-11-16 LAB — COMPREHENSIVE METABOLIC PANEL
ALT: 44 U/L (ref 0–44)
AST: 64 U/L — ABNORMAL HIGH (ref 15–41)
Albumin: 4.1 g/dL (ref 3.5–5.0)
Alkaline Phosphatase: 66 U/L (ref 38–126)
Anion gap: 14 (ref 5–15)
BUN: 12 mg/dL (ref 8–23)
CO2: 28 mmol/L (ref 22–32)
Calcium: 8.7 mg/dL — ABNORMAL LOW (ref 8.9–10.3)
Chloride: 100 mmol/L (ref 98–111)
Creatinine, Ser: 1.38 mg/dL — ABNORMAL HIGH (ref 0.61–1.24)
GFR calc Af Amer: >60 mL/min (ref >60)
GFR calc non Af Amer: 52 mL/min — ABNORMAL LOW (ref >60)
Glucose, Bld: 125 mg/dL — ABNORMAL HIGH (ref 70–99)
Potassium: 3 mmol/L — ABNORMAL LOW (ref 3.5–5.1)
Sodium: 142 mmol/L (ref 135–145)
Total Bilirubin: 1 mg/dL (ref 0.3–1.2)
Total Protein: 7.1 g/dL (ref 6.5–8.1)

## 2019-11-16 LAB — LACTATE DEHYDROGENASE: LDH: 323 U/L — ABNORMAL HIGH (ref 98–192)

## 2019-11-16 LAB — CBC WITH DIFFERENTIAL/PLATELET
Abs Immature Granulocytes: 0.03 10*3/uL (ref 0.00–0.07)
Basophils Absolute: 0.1 10*3/uL (ref 0.0–0.1)
Basophils Relative: 1 %
Eosinophils Absolute: 0.1 10*3/uL (ref 0.0–0.5)
Eosinophils Relative: 1 %
HCT: 48.1 % (ref 39.0–52.0)
Hemoglobin: 15.4 g/dL (ref 13.0–17.0)
Immature Granulocytes: 1 %
Lymphocytes Relative: 20 %
Lymphs Abs: 0.9 10*3/uL (ref 0.7–4.0)
MCH: 29.2 pg (ref 26.0–34.0)
MCHC: 32 g/dL (ref 30.0–36.0)
MCV: 91.3 fL (ref 80.0–100.0)
Monocytes Absolute: 0.7 10*3/uL (ref 0.1–1.0)
Monocytes Relative: 16 %
Neutro Abs: 2.8 10*3/uL (ref 1.7–7.7)
Neutrophils Relative %: 61 %
Platelets: 108 10*3/uL — ABNORMAL LOW (ref 150–400)
RBC: 5.27 MIL/uL (ref 4.22–5.81)
RDW: 14.6 % (ref 11.5–15.5)
WBC: 4.6 10*3/uL (ref 4.0–10.5)
nRBC: 0 % (ref 0.0–0.2)

## 2019-11-16 LAB — TROPONIN I (HIGH SENSITIVITY)
Troponin I (High Sensitivity): 17 ng/L (ref <18)
Troponin I (High Sensitivity): 17 ng/L (ref <18)

## 2019-11-16 LAB — BRAIN NATRIURETIC PEPTIDE: B Natriuretic Peptide: 23 pg/mL (ref 0.0–100.0)

## 2019-11-16 LAB — D-DIMER, QUANTITATIVE: D-Dimer, Quant: 0.68 ug/mL-FEU — ABNORMAL HIGH (ref 0.00–0.50)

## 2019-11-16 LAB — PROTIME-INR
INR: 1.5 — ABNORMAL HIGH (ref 0.8–1.2)
Prothrombin Time: 17.6 seconds — ABNORMAL HIGH (ref 11.4–15.2)

## 2019-11-16 LAB — INFLUENZA PANEL BY PCR (TYPE A & B)
Influenza A By PCR: NEGATIVE
Influenza B By PCR: NEGATIVE

## 2019-11-16 LAB — SARS CORONAVIRUS 2 (TAT 6-24 HRS): SARS Coronavirus 2: POSITIVE — AB

## 2019-11-16 LAB — LACTIC ACID, PLASMA
Lactic Acid, Venous: 2 mmol/L (ref 0.5–1.9)
Lactic Acid, Venous: 1.7 mmol/L (ref 0.5–1.9)

## 2019-11-16 LAB — FERRITIN: Ferritin: 420 ng/mL — ABNORMAL HIGH (ref 24–336)

## 2019-11-16 LAB — TRIGLYCERIDES: Triglycerides: 273 mg/dL — ABNORMAL HIGH (ref <150)

## 2019-11-16 LAB — PROCALCITONIN: Procalcitonin: <0.1 ng/mL

## 2019-11-16 LAB — FIBRINOGEN: Fibrinogen: 321 mg/dL (ref 210–475)

## 2019-11-16 LAB — C-REACTIVE PROTEIN: CRP: 1 mg/dL — ABNORMAL HIGH (ref <1.0)

## 2019-11-16 MED ORDER — METHYLPREDNISOLONE SODIUM SUCC 40 MG IJ SOLR
40.0000 mg | Freq: Three times a day (TID) | INTRAMUSCULAR | Status: DC
  Administered 2019-11-16 – 2019-11-17 (×3): 40 mg via INTRAVENOUS
  Filled 2019-11-16 (×3): qty 1

## 2019-11-16 MED ORDER — LEVOFLOXACIN IN D5W 750 MG/150ML IV SOLN: 750.0000 mg | INTRAVENOUS | Status: DC

## 2019-11-16 MED ORDER — PHENYTOIN SODIUM EXTENDED 100 MG PO CAPS
100.0000 mg | ORAL_CAPSULE | Freq: Every morning | ORAL | Status: DC
  Administered 2019-11-16 – 2019-11-23 (×8): 100 mg via ORAL
  Filled 2019-11-16 (×8): qty 1

## 2019-11-16 MED ORDER — METHYLPREDNISOLONE SODIUM SUCC 125 MG IJ SOLR
125.0000 mg | Freq: Once | INTRAMUSCULAR | Status: AC
  Administered 2019-11-16: 10:00:00 125 mg via INTRAVENOUS
  Filled 2019-11-16: qty 2

## 2019-11-16 MED ORDER — HYDROCORTISONE (PERIANAL) 2.5 % EX CREA
TOPICAL_CREAM | Freq: Two times a day (BID) | CUTANEOUS | Status: AC
  Administered 2019-11-16: 18:00:00 via RECTAL
  Administered 2019-11-17: 1 via RECTAL
  Administered 2019-11-17: 10:00:00 via RECTAL
  Administered 2019-11-18: 1 via RECTAL
  Administered 2019-11-18 – 2019-11-21 (×6): via RECTAL
  Filled 2019-11-16: qty 28.35

## 2019-11-16 MED ORDER — PANTOPRAZOLE SODIUM 40 MG PO TBEC
40.0000 mg | DELAYED_RELEASE_TABLET | Freq: Every day | ORAL | Status: DC
  Administered 2019-11-16 – 2019-11-23 (×8): 40 mg via ORAL
  Filled 2019-11-16 (×8): qty 1

## 2019-11-16 MED ORDER — PHENYTOIN SODIUM EXTENDED 100 MG PO CAPS
200.0000 mg | ORAL_CAPSULE | Freq: Every day | ORAL | Status: DC
  Administered 2019-11-16 – 2019-11-22 (×7): 200 mg via ORAL
  Filled 2019-11-16 (×8): qty 2

## 2019-11-16 MED ORDER — RISAQUAD PO CAPS
1.0000 | ORAL_CAPSULE | Freq: Every day | ORAL | Status: DC
  Administered 2019-11-16 – 2019-11-23 (×8): 1 via ORAL
  Filled 2019-11-16 (×9): qty 1

## 2019-11-16 MED ORDER — POTASSIUM CHLORIDE CRYS ER 20 MEQ PO TBCR
40.0000 meq | EXTENDED_RELEASE_TABLET | Freq: Once | ORAL | Status: AC
  Administered 2019-11-16: 18:00:00 40 meq via ORAL
  Filled 2019-11-16: qty 2

## 2019-11-16 MED ORDER — WARFARIN - PHARMACIST DOSING INPATIENT
Freq: Every day | Status: DC
  Administered 2019-11-16: 18:00:00

## 2019-11-16 MED ORDER — HYDROCORT-PRAMOXINE (PERIANAL) 1-1 % EX FOAM
1.0000 | Freq: Two times a day (BID) | CUTANEOUS | Status: DC
  Filled 2019-11-16: qty 10

## 2019-11-16 MED ORDER — SODIUM CHLORIDE 0.9 % IV SOLN
200.0000 mg | Freq: Once | INTRAVENOUS | Status: AC
  Administered 2019-11-17: 01:00:00 200 mg via INTRAVENOUS
  Filled 2019-11-16: qty 40

## 2019-11-16 MED ORDER — ALLOPURINOL 100 MG PO TABS
100.0000 mg | ORAL_TABLET | Freq: Two times a day (BID) | ORAL | Status: DC
  Administered 2019-11-16 – 2019-11-23 (×14): 100 mg via ORAL
  Filled 2019-11-16 (×16): qty 1

## 2019-11-16 MED ORDER — UMECLIDINIUM BROMIDE 62.5 MCG/INH IN AEPB: 1.0000 | INHALATION_SPRAY | Freq: Every day | RESPIRATORY_TRACT | Status: DC

## 2019-11-16 MED ORDER — IPRATROPIUM-ALBUTEROL 20-100 MCG/ACT IN AERS
1.0000 | INHALATION_SPRAY | Freq: Four times a day (QID) | RESPIRATORY_TRACT | Status: DC
  Administered 2019-11-16 – 2019-11-23 (×27): 1 via RESPIRATORY_TRACT
  Filled 2019-11-16: qty 4

## 2019-11-16 MED ORDER — WARFARIN SODIUM 4 MG PO TABS: 4.5000 mg | ORAL_TABLET | Freq: Once | ORAL | Status: DC

## 2019-11-16 MED ORDER — WARFARIN SODIUM 3 MG PO TABS
4.5000 mg | ORAL_TABLET | Freq: Once | ORAL | Status: DC
  Filled 2019-11-16: qty 1.5

## 2019-11-16 MED ORDER — WARFARIN SODIUM 2 MG PO TABS
4.0000 mg | ORAL_TABLET | Freq: Once | ORAL | Status: AC
  Administered 2019-11-16: 4 mg via ORAL
  Filled 2019-11-16: qty 2

## 2019-11-16 MED ORDER — POTASSIUM CHLORIDE CRYS ER 20 MEQ PO TBCR
20.0000 meq | EXTENDED_RELEASE_TABLET | Freq: Once | ORAL | Status: AC
  Administered 2019-11-16: 11:00:00 20 meq via ORAL
  Filled 2019-11-16: qty 1

## 2019-11-16 MED ORDER — FLUTICASONE FUROATE-VILANTEROL 100-25 MCG/INH IN AEPB
1.0000 | INHALATION_SPRAY | Freq: Every day | RESPIRATORY_TRACT | Status: DC
  Administered 2019-11-17 – 2019-11-23 (×7): 1 via RESPIRATORY_TRACT
  Filled 2019-11-16 (×2): qty 28

## 2019-11-16 MED ORDER — LEVOFLOXACIN IN D5W 750 MG/150ML IV SOLN
750.0000 mg | Freq: Once | INTRAVENOUS | Status: AC
  Administered 2019-11-16: 12:00:00 750 mg via INTRAVENOUS
  Filled 2019-11-16: qty 150

## 2019-11-16 MED ORDER — SODIUM CHLORIDE 0.9 % IV SOLN
INTRAVENOUS | Status: DC | PRN
  Administered 2019-11-16: 250 mL via INTRAVENOUS

## 2019-11-16 MED ORDER — DICYCLOMINE HCL 10 MG PO CAPS
20.0000 mg | ORAL_CAPSULE | Freq: Two times a day (BID) | ORAL | Status: DC
  Administered 2019-11-16 – 2019-11-17 (×4): 20 mg via ORAL
  Filled 2019-11-16 (×5): qty 2

## 2019-11-16 MED ORDER — ONDANSETRON HCL 4 MG/2ML IJ SOLN
4.0000 mg | Freq: Once | INTRAMUSCULAR | Status: AC
  Administered 2019-11-16: 23:00:00 4 mg via INTRAVENOUS
  Filled 2019-11-16: qty 2

## 2019-11-16 NOTE — H&P (Signed)
History and Physical    [name] [id number] DOB: 1951/02/03 DOA: 11/16/2019  Referring MD/NP/PA: Dr. Rogene Houston  PCP: [name], MD  Patient coming from: Home  Chief Complaint: Shortness of breath, productive cough, general malaise  HPI: [name] is a 68 y.o. male with significant past medical history significant for rheumatoid arthritis, asthma/COPD, bilateral lower extremity DVTs, gastroesophageal disease, gout, seizure disorder, prostate cancer and internal hemorrhoids/diverticulosis; who presented to the hospital secondary to general malaise, shortness of breath, easy fatigability and productive cough.  Patient reports of hypoxia in the 88 percentage on room air at home.  He has noticed increasing wheezing.  Reports wife with similar symptoms.  No fever, no Eliquis male for loosing days; no chills, no hemoptysis, no chest pain, no palpitations, no increased swelling or orthopnea reported. In the ED work-up demonstrated abnormal inflammatory markers, right upper lobe infiltrates on x-ray and hypoxia.  Patient with increased respiratory rate.  Covid test has been ordered, but pending. Influenza panel checked and negative; cultures taken, started on IV antibiotics and the steroids given.  [facility] RN contacted to admit patient for further evaluation and management.  Past Medical/Surgical History: Past Medical History:  Diagnosis Date  . Anxiety    hx of   . Arthritis    RA  . Asthma   . Clotting disorder (HCC)    DVT both legs   . COPD (chronic obstructive pulmonary disease) ([place])   . DDD (degenerative disc disease), cervical    with UE's paresthesias  . Diverticulosis   . DVT, lower extremity ([place])    bilat  . 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 GERD (gastroesophageal reflux disease)   . Gout   . Heart murmur   . History of measles   . History of shingles   . Hypercholesterolemia   . IBS  (irritable bowel syndrome)   . IBS (irritable bowel syndrome)   . Peripheral edema   . Pneumonia    hx of   . Prostate cancer ([place])   . Seizures ([place])    last seizure 20 years ago; on dilantin. Unknown origin.  [name] Shortness of breath dyspnea    exertion   . Sleep apnea    not on cpap  . Tubular adenoma of colon 04/2008    Past Surgical History:  Procedure Laterality Date  . CHOLECYSTECTOMY    . COLONOSCOPY    . CYSTOSCOPY WITH LITHOLAPAXY N/A 03/17/2019   Procedure: CYSTOSCOPY WITH LITHOLAPAXY AND REMOVAL OF FOREIGN BODY;  Surgeon: [name], MD;  Location: [facility];  Service: Urology;  Laterality: N/A;  . DOPPLER ECHOCARDIOGRAPHY N/A 01-21-2012   TECHNICALLY DIFFICULT. MILD CONCENTRIC LV HYPERTROPHY. LV CAVITY IS SMALL.. EF=> 55%. TRANSMITRAL SPECTRAL FLOW PATTREN IS SUGGESTIVE OF IMPAIRED LV RELAXATION. RV SYSTOLIC PRESSURE IS [id number]. LEFT ATRIAL SIZE IS NORMAL. AV APPEARS MILDLY SCLEROTIC. NO SIGN VALVE DISEASE NOTED.  [name] LYMPHADENECTOMY Bilateral 08/30/2015   Procedure: PELVIC LYMPHADENECTOMY;  Surgeon: [name], MD;  Location: [facility];  Service: Urology;  Laterality: Bilateral;  . NUCLEAR STRESS TEST N/A 01-21-2012   NORMAL PATTERN OF PERFUSION IN ALL REGIONS. POST STRESS LV SIZE IS NORMAL. NO EVIDENCE OF INDUCIBLE ISCHEMIA. EF 54%.  [name] POLYPECTOMY    . PROSTATE BIOPSY    . ROBOT ASSISTED LAPAROSCOPIC RADICAL PROSTATECTOMY N/A 08/30/2015   Procedure: ROBOTIC ASSISTED LAPAROSCOPIC RADICAL PROSTATECTOMY;  Surgeon: [name], MD;  Location: [facility];  Service: Urology;  Laterality: N/A;  . US VENOUS LOWER EXT Right 03/07/11   PERSISTENT DVT IN RIGHT LOWER EXT.WITH PERSISTANT VISUALIZATION OF HYPOECHOIC THROMBUS WITHIN THE FEMORAL, PROFUNDA FEMORAL AND POPLITEAL VEINS. WHEN COMPARED TO PREVIOUS, CLOT IS NO LONGER IDENTIFIED WITH IN THE RIGHT CFV.    Social History:  reports that he quit smoking about 22 years ago. His smoking use included cigarettes. He has a 60.00  pack-year smoking history. He has never used smokeless tobacco. He reports that he does not drink alcohol or use drugs.  Allergies: Allergies  Allergen Reactions  . Orencia [Abatacept] Anaphylaxis    Had prostate cancer  . Carbamazepine Rash    REACTION: makes drowsy BRAND NAME IS TEGRETOL  . Celecoxib Itching and Rash    BRAND NAME IS CELEBREX  . Cephalexin Rash    "Severe Rash".  BRAND NAME IS KEFLEX.   . Enbrel [Etanercept] Rash  . Humira [Adalimumab] Rash  . Levofloxacin Other (See Comments)    REACTION: GI Intolerance BRAND NAME IS LEVAQUIN  . Sulfa Antibiotics Rash  . Sulfasalazine Rash    Family History:  Family History  Problem Relation Age of Onset  . Skin cancer Father   . Stomach cancer Father   . Heart attack Father   . Alzheimer's disease Mother   . Throat cancer Brother   . Stomach cancer Brother   . Lung cancer Brother   . Heart attack Brother   . Heart attack Brother   . Breast cancer Sister   . Heart attack Sister   . Stroke Sister   . Bladder Cancer Sister   . Heart murmur Sister   . Colon cancer Brother   . Colon polyps Neg Hx     Prior to Admission medications   Medication Sig Start Date End Date Taking? Authorizing Provider  acetaminophen (TYLENOL) 500 MG tablet Take 1,000 mg by mouth every 6 (six) hours as needed (arthritis pain).   Yes [provider]  albuterol (PROVENTIL) (2.5 MG/3ML) 0.083% nebulizer solution Take 3 mLs (2.5 mg total) by nebulization every 6 (six) hours as needed for wheezing or shortness of breath. 07/05/17  Yes Noemi Chapel, MD  alendronate (FOSAMAX) 70 MG tablet TAKE 1 TABLET BY MOUTH ONCE A WEEK. TAKE WITH FULL GLASS OF WATER ON EMPTY STOMACH 09/14/19  Yes Deveshwar, Abel Presto, MD  allopurinol (ZYLOPRIM) 100 MG tablet TAKE 1 TABLET BY MOUTH TWICE A DAY 09/07/19  Yes Deveshwar, Abel Presto, MD  BREO ELLIPTA 100-25 MCG/INH AEPB USE 1 INHALATION ORALLY    DAILY 10/29/19  Yes Rigoberto Noel, MD  Calcium-Phosphorus-Vitamin D  (CITRACAL +D3 PO) Take 2 tablets by mouth 2 (two) times daily.    Yes [provider]  diclofenac sodium (VOLTAREN) 1 % GEL Apply 3 grams to 3 large joints up to 3 times daily Patient taking differently: Apply 3 g topically 3 (three) times daily as needed (pain). Apply 3 grams to 3 large joints up to 3 times daily 10/21/18  Yes Ofilia Neas, PA-C  dicyclomine (BENTYL) 10 MG capsule TAKE 1 CAPSULE BY MOUTH 4 TIMES DAILY BEFORE MEALS AND AT BEDTIME Patient taking differently: Take 20 mg by mouth 2 (two) times daily.  08/11/17  Yes Ladene Artist, MD  folic acid (FOLVITE) 1 MG tablet TAKE 2 TABLETS BY MOUTH EVERY DAY IN THE MORNING Patient taking differently: Take 2 mg by mouth daily.  12/11/18  Yes Deveshwar, Abel Presto, MD  furosemide (LASIX) 40 MG  tablet Take 1 tablet (40 mg total) by mouth 2 (two) times daily. 03/01/19  Yes Meng, Hao, PA  INCRUSE ELLIPTA 62.5 MCG/INH AEPB TAKE 1 PUFF BY MOUTH EVERY DAY 10/20/19  Yes Rigoberto Noel, MD  omeprazole (PRILOSEC) 40 MG capsule Take 40 mg by mouth at bedtime.  09/23/16  Yes [provider]  phenytoin (DILANTIN) 100 MG ER capsule Take 100-200 mg by mouth See admin instructions. Take one capsule in the morning and 2 capsules at bedtime   Yes [provider]  Potassium Chloride ER 20 MEQ TBCR Take 20 mEq by mouth daily.  02/24/19  Yes [provider]  Probiotic Product (PROBIOTIC DAILY) CAPS Take 1 capsule by mouth daily.   Yes [provider]  Tocilizumab (ACTEMRA IV) Inject into the vein every 28 (twenty-eight) days. For Rheumatoid Arthritis   Yes [provider]  warfarin (COUMADIN) 3 MG tablet Take 3 mg by mouth at bedtime.    Yes [provider]  leflunomide (ARAVA) 20 MG tablet TAKE 1 TABLET BY MOUTH EVERY DAY Patient not taking: Reported on 11/16/2019 08/23/19   Bo Merino, MD    Review of Systems:  Negative except as otherwise mentioned in HPI.  Physical Exam: Vitals:   11/16/19  1500 11/16/19 1530 11/16/19 1600 11/16/19 1615  BP: 133/83  127/83   Pulse:  85  86  Resp:  (!) 24 19 15   Temp:      TempSrc:      SpO2:  95%  95%  Weight:      Height:        Constitutional: In mild distress secondary to shortness of breath; feeling weak and easily fatigued.  Using 2 L nasal cannula supplementation.  No chest pain, no nausea or vomiting.   Eyes: PERRL, lids and conjunctivae normal, no icterus. ENMT: Mucous membranes are moist. Posterior pharynx clear of any exudate or lesions. Neck: normal, supple, no masses, no thyromegaly, no JVD on exam. Respiratory: Positive expiratory wheezing, diffuse rhonchi; no frank crackles.  No using accessory muscles. Cardiovascular: Regular rate and rhythm, rubs or gallops; no lower extremity edema appreciated on exam.  No bruits. Abdomen: no tenderness, no masses palpated. No hepatosplenomegaly. Bowel sounds positive.  Musculoskeletal: no clubbing / cyanosis. No joint deformity upper and lower extremities. Good ROM, no contractures. Normal muscle tone.  Skin: no rashes, no petechiae. Neurologic: CN 2-12 grossly intact. Sensation intact, DTR normal. Strength 5/5 in all 4.  Psychiatric: Normal judgment and insight. Alert and oriented x 3. Normal mood.    Labs on Admission: I have personally reviewed the following labs and imaging studies  CBC: Recent Labs  Lab 11/16/19 0915  WBC 4.6  NEUTROABS 2.8  HGB 15.4  HCT 48.1  MCV 91.3  PLT 123XX123*   Basic Metabolic Panel: Recent Labs  Lab 11/16/19 0915  NA 142  K 3.0*  CL 100  CO2 28  GLUCOSE 125*  BUN 12  CREATININE 1.38*  CALCIUM 8.7*   GFR: Estimated Creatinine Clearance: 57.9 mL/min (A) (by C-G formula based on SCr of 1.38 mg/dL (H)).   Liver Function Tests: Recent Labs  Lab 11/16/19 0915  AST 64*  ALT 44  ALKPHOS 66  BILITOT 1.0  PROT 7.1  ALBUMIN 4.1   Coagulation Profile: Recent Labs  Lab 11/16/19 1244  INR 1.5*   BNP (last 3 results) Recent Labs     02/08/19 1437  PROBNP 26.0   Lipid Profile: Recent Labs    11/16/19  0915  TRIG 273*   Anemia Panel: Recent Labs    11/16/19 0915  FERRITIN 420*   Urine analysis:    Component Value Date/Time   COLORURINE YELLOW 11/29/2018 1033   APPEARANCEUR HAZY (A) 11/29/2018 1033   LABSPEC 1.025 11/29/2018 1033   PHURINE 5.0 11/29/2018 1033   GLUCOSEU NEGATIVE 11/29/2018 1033   HGBUR MODERATE (A) 11/29/2018 1033   BILIRUBINUR NEGATIVE 11/29/2018 1033   KETONESUR NEGATIVE 11/29/2018 1033   PROTEINUR 30 (A) 11/29/2018 1033   UROBILINOGEN 0.2 09/24/2012 1640   NITRITE NEGATIVE 11/29/2018 1033   LEUKOCYTESUR NEGATIVE 11/29/2018 1033   Sepsis Labs: @LABRCNTIP (procalcitonin:4,lacticidven:4) ) Recent Results (from the past 240 hour(s))  Blood Culture (routine x 2)     Status: None (Preliminary result)   Collection Time: 11/16/19 10:01 AM   Specimen: BLOOD RIGHT HAND  Result Value Ref Range Status   Specimen Description BLOOD RIGHT HAND  Final   Special Requests   Final    BOTTLES DRAWN AEROBIC AND ANAEROBIC Blood Culture results may not be optimal due to an inadequate volume of blood received in culture bottles Performed at [facility], [address], [place], [place] [address]    Culture PENDING  Incomplete   Report Status PENDING  Incomplete  Blood Culture (routine x 2)     Status: None (Preliminary result)   Collection Time: 11/16/19 10:02 AM   Specimen: BLOOD LEFT ARM  Result Value Ref Range Status   Specimen Description BLOOD LEFT ARM  Final   Special Requests   Final    BOTTLES DRAWN AEROBIC AND ANAEROBIC Blood Culture results may not be optimal due to an inadequate volume of blood received in culture bottles Performed at [facility], [address] [place], [place] [address]    Culture PENDING  Incomplete   Report Status PENDING  Incomplete     Radiological Exams on Admission: Dg Chest Port 1 View  Result Date: 11/16/2019 CLINICAL DATA:  Shortness of breath, cold  symptoms for a week, productive cough, fatigue, intermittent chest pain, decreased oxygen saturation, worsening shortness of breath this morning, history prostate cancer, former smoker, asthma EXAM: PORTABLE CHEST 1 VIEW COMPARISON:  Portable exam 0929 hours compared to 06/07/2019 FINDINGS: Chronic elevation LEFT diaphragm. Upper normal heart size with slight vascular congestion. Mediastinal contours normal. Atherosclerotic calcification aorta. LEFT basilar atelectasis. Mild infiltrate RIGHT upper lobe question pneumonia. No pleural effusion or pneumothorax. IMPRESSION: Suspected RIGHT upper lobe pneumonia. Chronic elevation of LEFT diaphragm with LEFT basilar atelectasis. Electronically Signed   By: Lavonia Dana M.D.   On: 11/16/2019 09:55    EKG: ordered, but pending results at time of dictation.   Assessment/Plan 1-acute respiratory failure with hypoxia (HCC) -With concerns for community-acquired pneumonia -Chest x-ray demonstrating right upper lobe infiltrate -Curb 65 score is 2 -Patient with elevated respiratory rate and hypoxia; also high risk for infections given tx for RA. -Admitted to [facility] -Started on IV Levaquin -Oxygen supplementation provided -With underlying history of COPD (with positive wheezing); will use inhalers, steroids and flutter valve. -Continue Breo Ellipta. -COVID test pending -Follow culture results (blood cultures, sputum culture, streptococcal antigen and Legionella antigen in urine has been ordered). -Negative influenza panel.  2-COPD with acute exacerbation -Patient currently wheezing -Concerns of exacerbation secondary to community-acquired pneumonia -Antibiotics as mentioned above -Continue steroids, Combivent and Breo Ellipta. -Covid test has been ordered; once negative results acquired will be able to use nebulizer management.  3-GERD -Continue PPI  4-bright red blood per rectum -Prior  colonoscopy in 2017 with positive internal hemorrhoids and also  diverticulosis -Patient will be started on Proctofoam BID -Hgb stable and otherwise in no hemodynamically distress.  5-Personal history of DVT (deep vein thrombosis) -No lower extremity swelling or pain -INR subtherapeutic -Pharmacy assisting with the dose of Coumadin.  6-Rheumatoid arthritis ([place]) -Holding Arava and Tocilizumab in the setting of acute infection -Continue outpatient follow-up with rheumatology.  7-Seizure disorder (HCC) -Continue Dilantin.  8-Malignant neoplasm of prostate ([place]) -Appears to be stable -Continue outpatient follow-up with urology/oncology service.  9-Chronic diastolic HF (heart failure) (HCC) -Compensated -Low-sodium diet, daily weights and strict I's and O's have been ordered -Holding Lasix initially in the setting of mild elevation in creatinine.  10-chronic kidney disease -Stage III a at baseline -Creatinine slightly higher than at baseline -Follow renal function trend -Holding Lasix for now.    DVT prophylaxis: Chronically on Coumadin Code Status: Full code Family Communication: No family at bedside. Disposition Plan: Home once respiratory status improved and is stabilized. Consults called: None Admission status: Inpatient, MedSurg, length of stay more than 2 midnights.   Time Spent: 70 minutes.  Barton Dubois MD [facility] Pager [phone]   11/16/2019, 5:27 PM

## 2019-11-16 NOTE — ED Provider Notes (Signed)
Medical screening examination/treatment/procedure(s) were conducted as a shared visit with non-physician practitioner(s) and myself.  I personally evaluated the patient during the encounter.     ED ECG REPORT   Date: 11/16/2019  Rate: 84  Rhythm: normal sinus rhythm  QRS Axis: normal  Intervals: normal  ST/T Wave abnormalities: ST depressions diffusely  Conduction Disutrbances:none  Narrative Interpretation:   Old EKG Reviewed: none available  I have personally reviewed the EKG tracing and agree with the computerized printout as noted.  EKG with minimal ST depression in diffuse leads.  No old EKG for comparison.  Patient seen by me along with the physician assistant.  Patient's presentation highly suggestive of COVID-19 infection.  Patient's had upper respiratory symptoms for a week.  Cough productive at times.  Intermittent chest discomfort decreased oxygen sats.  Oxygen sats were 88% this morning at home.  Patient does have a history of COPD.  Patient did take a breathing treatment at home and it did increase to 92%.  No fever here.  Currently oxygen sats 96%.  Chest x-ray pending.  Covid testing is been ordered.  Chest x-ray shows right upper lobe pneumonia.  Does not show a multifocal pneumonia.  Will start antibiotics for community-acquired pneumonia.  Patient has hypoxia and is requiring oxygen will require admission.  Covid labs still very suggestive of COVID-19 infection.  Formal Covid testing still pending.   Fredia Sorrow, MD 11/16/19 1100

## 2019-11-16 NOTE — Plan of Care (Signed)
  Problem: Clinical Measurements: Goal: Ability to maintain clinical measurements within normal limits will improve Outcome: Not Progressing   Problem: Clinical Measurements: Goal: Will remain free from infection Outcome: Not Progressing   Problem: Clinical Measurements: Goal: Cardiovascular complication will be avoided Outcome: Not Progressing

## 2019-11-16 NOTE — Progress Notes (Signed)
CRITICAL VALUE ALERT  Critical Value: COVID +  Date & Time Notied:  11/16/2019 2042  Provider Notified: Dr. Darrick Meigs

## 2019-11-16 NOTE — ED Notes (Signed)
Date and time results received: 11/16/19 10:23 AM  Test: Lactic Critical Value: 2.0  Name of Provider Notified: Apolonio Schneiders, RN and Dr. Rogene Houston

## 2019-11-16 NOTE — ED Triage Notes (Signed)
Pt reports he and his wife have both had cold symptoms x 1 week.  Reports cough, productive at times, fatigue, intermittent chest pain and decreased o2 sat.  Pt says o2 sat was 88% at home this morning.  Reports took a breathing treatment and sat increased to 92%.

## 2019-11-16 NOTE — ED Provider Notes (Signed)
[facility] EMERGENCY DEPARTMENT Provider Note   CSN: [id number] Arrival date & time: 11/16/19  [id number]     History   Chief Complaint Chief Complaint  Patient presents with  . Cough    HPI [name] is a 68 y.o. male with history of COPD, DVT currently on Coumadin, degenerative disc disease, GERD, prostate cancer, CHF presents for evaluation of acute onset, progressively worsening cough, shortness of breath, generalized weakness and fatigue for 1 week.  Reports wife has similar symptoms.  Notes intermittent "twinge" in chest which she thinks is related to persistent cough.  Cough is productive of clear and sometimes yellow or brown sputum.  Notes nausea but no abdominal pain.  Has had some intermittent diarrhea as well.  Denies urinary symptoms.  Shortness of breath worsens with ambulation and he also notes PND but denies orthopnea.  Notes chronic lower extremity edema which he states is about baseline for him.  Today noticed SPO2 saturations were 88% at home so he gave himself a breathing treatment with improvement, notes baseline O2 sats around 94%.  He is a former smoker, denies recreational drug use or excessive alcohol intake.  No known sick contacts or known COVID exposures.      The history is provided by the patient.    Past Medical History:  Diagnosis Date  . Anxiety    hx of   . Arthritis    RA  . Asthma   . Clotting disorder (HCC)    DVT both legs   . COPD (chronic obstructive pulmonary disease) ([place])   . DDD (degenerative disc disease), cervical    with UE's paresthesias  . Diverticulosis   . DVT, lower extremity ([place])    bilat  . 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 GERD (gastroesophageal reflux disease)   . Gout   . Heart murmur   . History of measles   . History of shingles   . Hypercholesterolemia   . IBS (irritable bowel syndrome)   . IBS (irritable bowel syndrome)   . Peripheral edema    . Pneumonia    hx of   . Prostate cancer ([place])   . Seizures ([place])    last seizure 20 years ago; on dilantin. Unknown origin.  [name] Shortness of breath dyspnea    exertion   . Sleep apnea    not on cpap  . Tubular adenoma of colon 04/2008    Patient Active Problem List   Diagnosis Date Noted  . CAP (community acquired pneumonia) 11/16/2019  . Class 2 severe obesity due to excess calories with serious comorbidity and body mass index (BMI) of 35.0 to 35.9 in adult [facility])   . Chronic diastolic HF (heart failure) ([place])   . Serratia sepsis ([place]) 06/08/2019  . Bacteremia due to Gram-negative bacteria 06/08/2019  . Sepsis due to cellulitis ([place]) 06/07/2019  . Voice hoarseness 04/26/2019  . OSA (obstructive sleep apnea) 03/10/2019  . Abnormal CT of the chest 02/09/2019  . Dyspnea on exertion 10/22/2017  . History of seizure disorder 02/27/2017  . Primary osteoarthritis of both hands 02/27/2017  . Primary osteoarthritis of both feet 02/27/2017  . Idiopathic gout of multiple sites 02/27/2017  . High risk medication use 12/29/2016  . History of prostate cancer 12/29/2016  . Primary osteoarthritis of both knees 12/29/2016  . Chronic idiopathic gout involving toe without tophus 12/29/2016  . History of CHF (congestive  heart failure) 12/29/2016  . History of COPD 12/29/2016  . Plantar pustular psoriasis 12/29/2016  . DVT (deep venous thrombosis) ([place]) 10/31/2016  . Chronic obstructive pulmonary disease ([place]) 10/31/2016  . CHF (congestive heart failure) ([place]) 10/31/2016  . Prostate cancer ([place]) 08/30/2015  . Malignant neoplasm of prostate ([place]) 07/04/2015  . Chest pain at rest 07/05/2014  . Weakness 07/05/2014  . Complicated postphlebitic syndrome 11/16/2013  . Personal history of DVT (deep vein thrombosis) 05/18/2013  . Chronic anticoagulation 05/18/2013  . Diverticulosis of colon without hemorrhage 05/18/2013  . Rheumatoid arthritis ([place]) 05/18/2013  . Seizure disorder ([place]) 05/18/2013   . Hx of adenomatous colonic polyps 05/18/2013  . GERD 05/08/2010  . NAUSEA 05/08/2010  . FLATULENCE-GAS-BLOATING 05/08/2010    Past Surgical History:  Procedure Laterality Date  . CHOLECYSTECTOMY    . COLONOSCOPY    . CYSTOSCOPY WITH LITHOLAPAXY N/A 03/17/2019   Procedure: CYSTOSCOPY WITH LITHOLAPAXY AND REMOVAL OF FOREIGN BODY;  Surgeon: [name], MD;  Location: [facility];  Service: Urology;  Laterality: N/A;  . DOPPLER ECHOCARDIOGRAPHY N/A 01-21-2012   TECHNICALLY DIFFICULT. MILD CONCENTRIC LV HYPERTROPHY. LV CAVITY IS SMALL.. EF=> 55%. TRANSMITRAL SPECTRAL FLOW PATTREN IS SUGGESTIVE OF IMPAIRED LV RELAXATION. RV SYSTOLIC PRESSURE IS [id number]. LEFT ATRIAL SIZE IS NORMAL. AV APPEARS MILDLY SCLEROTIC. NO SIGN VALVE DISEASE NOTED.  [name] LYMPHADENECTOMY Bilateral 08/30/2015   Procedure: PELVIC LYMPHADENECTOMY;  Surgeon: [name], MD;  Location: [facility];  Service: Urology;  Laterality: Bilateral;  . NUCLEAR STRESS TEST N/A 01-21-2012   NORMAL PATTERN OF PERFUSION IN ALL REGIONS. POST STRESS LV SIZE IS NORMAL. NO EVIDENCE OF INDUCIBLE ISCHEMIA. EF 54%.  [name] POLYPECTOMY    . PROSTATE BIOPSY    . ROBOT ASSISTED LAPAROSCOPIC RADICAL PROSTATECTOMY N/A 08/30/2015   Procedure: ROBOTIC ASSISTED LAPAROSCOPIC RADICAL PROSTATECTOMY;  Surgeon: [name], MD;  Location: [facility];  Service: Urology;  Laterality: N/A;  . US VENOUS LOWER EXT Right 03/07/11   PERSISTENT DVT IN RIGHT LOWER EXT.WITH PERSISTANT VISUALIZATION OF HYPOECHOIC THROMBUS WITHIN THE FEMORAL, PROFUNDA FEMORAL AND POPLITEAL VEINS. WHEN COMPARED TO PREVIOUS, CLOT IS NO LONGER IDENTIFIED WITH IN THE RIGHT CFV.        Home Medications    Prior to Admission medications   Medication Sig Start Date End Date Taking? Authorizing Provider  acetaminophen (TYLENOL) 500 MG tablet Take 1,000 mg by mouth every 6 (six) hours as needed (arthritis pain).   Yes [provider]  albuterol (PROVENTIL) (2.5 MG/3ML) 0.083% nebulizer  solution Take 3 mLs (2.5 mg total) by nebulization every 6 (six) hours as needed for wheezing or shortness of breath. 07/05/17  Yes Noemi Chapel, MD  alendronate (FOSAMAX) 70 MG tablet TAKE 1 TABLET BY MOUTH ONCE A WEEK. TAKE WITH FULL GLASS OF WATER ON EMPTY STOMACH 09/14/19  Yes Deveshwar, Abel Presto, MD  allopurinol (ZYLOPRIM) 100 MG tablet TAKE 1 TABLET BY MOUTH TWICE A DAY 09/07/19  Yes Deveshwar, Abel Presto, MD  BREO ELLIPTA 100-25 MCG/INH AEPB USE 1 INHALATION ORALLY    DAILY 10/29/19  Yes Rigoberto Noel, MD  Calcium-Phosphorus-Vitamin D (CITRACAL +D3 PO) Take 2 tablets by mouth 2 (two) times daily.    Yes [provider]  diclofenac sodium (VOLTAREN) 1 % GEL Apply 3 grams to 3 large joints up to 3 times daily Patient taking differently: Apply 3 g topically 3 (three) times daily as needed (pain). Apply 3 grams to 3 large joints up to 3 times daily 10/21/18  Yes Hazel Sams  M, PA-C  dicyclomine (BENTYL) 10 MG capsule TAKE 1 CAPSULE BY MOUTH 4 TIMES DAILY BEFORE MEALS AND AT BEDTIME Patient taking differently: Take 20 mg by mouth 2 (two) times daily.  08/11/17  Yes Ladene Artist, MD  folic acid (FOLVITE) 1 MG tablet TAKE 2 TABLETS BY MOUTH EVERY DAY IN THE MORNING Patient taking differently: Take 2 mg by mouth daily.  12/11/18  Yes Deveshwar, Abel Presto, MD  furosemide (LASIX) 40 MG tablet Take 1 tablet (40 mg total) by mouth 2 (two) times daily. 03/01/19  Yes Meng, Hao, PA  INCRUSE ELLIPTA 62.5 MCG/INH AEPB TAKE 1 PUFF BY MOUTH EVERY DAY 10/20/19  Yes Rigoberto Noel, MD  omeprazole (PRILOSEC) 40 MG capsule Take 40 mg by mouth at bedtime.  09/23/16  Yes [provider]  phenytoin (DILANTIN) 100 MG ER capsule Take 100-200 mg by mouth See admin instructions. Take one capsule in the morning and 2 capsules at bedtime   Yes [provider]  Potassium Chloride ER 20 MEQ TBCR Take 20 mEq by mouth daily.  02/24/19  Yes [provider]  Probiotic Product (PROBIOTIC DAILY) CAPS Take 1  capsule by mouth daily.   Yes [provider]  Tocilizumab (ACTEMRA IV) Inject into the vein every 28 (twenty-eight) days. For Rheumatoid Arthritis   Yes [provider]  warfarin (COUMADIN) 3 MG tablet Take 3 mg by mouth at bedtime.    Yes [provider]  leflunomide (ARAVA) 20 MG tablet TAKE 1 TABLET BY MOUTH EVERY DAY Patient not taking: Reported on 11/16/2019 08/23/19   Bo Merino, MD    Family History Family History  Problem Relation Age of Onset  . Skin cancer Father   . Stomach cancer Father   . Heart attack Father   . Alzheimer's disease Mother   . Throat cancer Brother   . Stomach cancer Brother   . Lung cancer Brother   . Heart attack Brother   . Heart attack Brother   . Breast cancer Sister   . Heart attack Sister   . Stroke Sister   . Bladder Cancer Sister   . Heart murmur Sister   . Colon cancer Brother   . Colon polyps Neg Hx     Social History Social History   Tobacco Use  . Smoking status: Former Smoker    Packs/day: 3.00    Years: 20.00    Pack years: 60.00    Types: Cigarettes    Quit date: 12/30/1996    Years since quitting: 22.8  . Smokeless tobacco: Never Used  Substance Use Topics  . Alcohol use: No    Alcohol/week: 0.0 standard drinks  . Drug use: No     Allergies   Orencia [abatacept], Carbamazepine, Celecoxib, Cephalexin, Enbrel [etanercept], Humira [adalimumab], Levofloxacin, Sulfa antibiotics, and Sulfasalazine   Review of Systems Review of Systems  Constitutional: Positive for fatigue. Negative for chills and fever.  Respiratory: Positive for cough and shortness of breath.   Cardiovascular: Positive for chest pain and leg swelling (chronic).  Gastrointestinal: Positive for diarrhea and nausea. Negative for abdominal pain and vomiting.  Neurological: Positive for weakness (generalized).  All other systems reviewed and are negative.    Physical Exam Updated Vital Signs BP (!) 143/82   Pulse 79    Temp 98.8 F (37.1 C) (Oral)   Resp 17   Ht 5\' 8"  (1.727 m)   Wt 99.8 kg   SpO2 98%   BMI 33.45 kg/m   Physical  Exam Vitals signs and nursing note reviewed.  Constitutional:      General: He is not in acute distress.    Appearance: He is well-developed.  HENT:     Head: Normocephalic and atraumatic.  Eyes:     General:        Right eye: No discharge.        Left eye: No discharge.     Conjunctiva/sclera: Conjunctivae normal.  Neck:     Musculoskeletal: Neck supple.     Vascular: No JVD.     Trachea: No tracheal deviation.  Cardiovascular:     Rate and Rhythm: Normal rate and regular rhythm.     Pulses: Normal pulses.     Comments: 2+ radial and DP/PT pulses bilaterally, 2+ pitting edema of the bilateral lower extremities.  Bevelyn Buckles' sign absent bilaterally Pulmonary:     Comments: Tachypneic, speaking in short phrases, scattered expiratory wheezes and rales noted Abdominal:     General: Abdomen is protuberant. Bowel sounds are normal. There is no distension.     Palpations: Abdomen is soft.     Tenderness: There is no abdominal tenderness. There is no guarding or rebound.  Skin:    General: Skin is warm and dry.     Findings: No erythema.  Neurological:     Mental Status: He is alert.  Psychiatric:        Behavior: Behavior normal.      ED Treatments / Results  Labs (all labs ordered are listed, but only abnormal results are displayed) Labs Reviewed  LACTIC ACID, PLASMA - Abnormal; Notable for the following components:      Result Value   Lactic Acid, Venous 2.0 (*)    All other components within normal limits  CBC WITH DIFFERENTIAL/PLATELET - Abnormal; Notable for the following components:   Platelets 108 (*)    All other components within normal limits  COMPREHENSIVE METABOLIC PANEL - Abnormal; Notable for the following components:   Potassium 3.0 (*)    Glucose, Bld 125 (*)    Creatinine, Ser 1.38 (*)    Calcium 8.7 (*)    AST 64 (*)    GFR calc non  Af Amer 52 (*)    All other components within normal limits  LACTATE DEHYDROGENASE - Abnormal; Notable for the following components:   LDH 323 (*)    All other components within normal limits  FERRITIN - Abnormal; Notable for the following components:   Ferritin 420 (*)    All other components within normal limits  TRIGLYCERIDES - Abnormal; Notable for the following components:   Triglycerides 273 (*)    All other components within normal limits  C-REACTIVE PROTEIN - Abnormal; Notable for the following components:   CRP 1.0 (*)    All other components within normal limits  D-DIMER, QUANTITATIVE (NOT AT [facility]) - Abnormal; Notable for the following components:   D-Dimer, Quant 0.68 (*)    All other components within normal limits  CULTURE, BLOOD (ROUTINE X 2)  CULTURE, BLOOD (ROUTINE X 2)  SARS CORONAVIRUS 2 (TAT 6-24 HRS)  PROCALCITONIN  BRAIN NATRIURETIC PEPTIDE  FIBRINOGEN  LACTIC ACID, PLASMA  INFLUENZA PANEL BY PCR (TYPE A & B)  TROPONIN I (HIGH SENSITIVITY)  TROPONIN I (HIGH SENSITIVITY)    EKG None  Radiology Dg Chest Port 1 View  Result Date: 11/16/2019 CLINICAL DATA:  Shortness of breath, cold symptoms for a week, productive cough, fatigue, intermittent chest pain, decreased oxygen saturation, worsening shortness of breath this  morning, history prostate cancer, former smoker, asthma EXAM: PORTABLE CHEST 1 VIEW COMPARISON:  Portable exam 0929 hours compared to 06/07/2019 FINDINGS: Chronic elevation LEFT diaphragm. Upper normal heart size with slight vascular congestion. Mediastinal contours normal. Atherosclerotic calcification aorta. LEFT basilar atelectasis. Mild infiltrate RIGHT upper lobe question pneumonia. No pleural effusion or pneumothorax. IMPRESSION: Suspected RIGHT upper lobe pneumonia. Chronic elevation of LEFT diaphragm with LEFT basilar atelectasis. Electronically Signed   By: Lavonia Dana M.D.   On: 11/16/2019 09:55    Procedures .Critical Care Performed  by: Renita Papa, PA-C Authorized by: Renita Papa, PA-C   Critical care provider statement:    Critical care time (minutes):  40   Critical care was necessary to treat or prevent imminent or life-threatening deterioration of the following conditions:  Respiratory failure   Critical care was time spent personally by me on the following activities:  Discussions with consultants, evaluation of patient's response to treatment, examination of patient, ordering and performing treatments and interventions, ordering and review of laboratory studies, ordering and review of radiographic studies, pulse oximetry, re-evaluation of patient's condition, obtaining history from patient or surrogate and review of old charts   (including critical care time)  Medications Ordered in ED Medications  levofloxacin (LEVAQUIN) IVPB 750 mg (750 mg Intravenous New Bag/Given 11/16/19 1134)  0.9 %  sodium chloride infusion (250 mLs Intravenous New Bag/Given 11/16/19 1133)  levofloxacin (LEVAQUIN) IVPB 750 mg (has no administration in time range)  methylPREDNISolone sodium succinate (SOLU-MEDROL) 125 mg/2 mL injection 125 mg (125 mg Intravenous Given 11/16/19 0955)  potassium chloride SA (KLOR-CON) CR tablet 20 mEq (20 mEq Oral Given 11/16/19 1040)     Initial Impression / Assessment and Plan / ED Course  I have reviewed the triage vital signs and the nursing notes.  Pertinent labs & imaging results that were available during my care of the patient were reviewed by me and considered in my medical decision making (see chart for details).        ART MCGLOIN was evaluated in Emergency Department on 11/16/2019 for the symptoms described in the history of present illness. He was evaluated in the context of the global COVID-19 pandemic, which necessitated consideration that the patient might be at risk for infection with the SARS-CoV-2 virus that causes COVID-19. Institutional protocols and algorithms that pertain  to the evaluation of patients at risk for COVID-19 are in a state of rapid change based on information released by regulatory bodies including the CDC and federal and state organizations. These policies and algorithms were followed during the patient's care in the ED.  Patient presenting for evaluation of 1 week of progressively worsening dyspnea on exertion, cough, fatigue and generalized myalgias as well as nausea and diarrhea.  He is afebrile in the ED.  At 1 point hypoxic to 88% on room air so was placed on 2 L supplemental oxygen with improvement in SPO2 saturations.  He had improvement at home with a breathing treatment as well.  Differential considerations include community-acquired pneumonia, COVID-19 infection, COPD, CHF exacerbation.  Lab work today reviewed by me shows no leukocytosis, no anemia, mild elevation in creatinine compared to baseline but BUN is within normal limits.  His potassium is low today at 3.0, will replenish orally.  Covid markers are elevated.  Very mildly elevated lactic acidosis however he does not appear to be overtly septic at this time and he is hemodynamically stable.  Will avoid 30 cc/kg bolus at this time as a  result.  D-dimer is mildly elevated but able to age adjust.  Symptoms are a little atypical for PE in the absence of pleuritic chest pain.  Chest x-ray shows right upper lobe pneumonia.  Will start on Levaquin in the ED but if he is Covid positive would consider discontinuing.  Due to hypoxia will require admission.  Spoke with Dr. Dyann Kief who recommends obtaining influenza test as well but agrees to assume care of patient and bring him into the hospital for further evaluation and management.   Final Clinical Impressions(s) / ED Diagnoses   Final diagnoses:  Acute on chronic respiratory failure with hypoxia ([place])  Pneumonia of right upper lobe due to infectious organism    ED Discharge Orders    None       Renita Papa, PA-C 11/16/19 1206     Fredia Sorrow, MD 11/21/19 [phone]

## 2019-11-16 NOTE — Progress Notes (Addendum)
ANTICOAGULATION CONSULT NOTE - Initial Consult  Pharmacy Consult for Warfarin Indication: history of DVT  Allergies  Allergen Reactions  . Orencia [Abatacept] Anaphylaxis    Had prostate cancer  . Carbamazepine Rash    REACTION: makes drowsy BRAND NAME IS TEGRETOL  . Celecoxib Itching and Rash    BRAND NAME IS CELEBREX  . Cephalexin Rash    "Severe Rash".  BRAND NAME IS KEFLEX.   . Enbrel [Etanercept] Rash  . Humira [Adalimumab] Rash  . Levofloxacin Other (See Comments)    REACTION: GI Intolerance BRAND NAME IS LEVAQUIN  . Sulfa Antibiotics Rash  . Sulfasalazine Rash    Patient Measurements: Height: 5\' 8"  (172.7 cm) Weight: 220 lb (99.8 kg) IBW/kg (Calculated) : 68.4   Vital Signs: Temp: 98.8 F (37.1 C) (11/17 0854) Temp Source: Oral (11/17 0854) BP: 143/82 (11/17 1000) Pulse Rate: 79 (11/17 1015)  Labs: Recent Labs    11/16/19 0915 11/16/19 1244  HGB 15.4  --   HCT 48.1  --   PLT 108*  --   LABPROT  --  17.6*  INR  --  1.5*  CREATININE 1.38*  --   TROPONINIHS 17 17    Estimated Creatinine Clearance: 57.9 mL/min (A) (by C-G formula based on SCr of 1.38 mg/dL (H)).   Medical History: Past Medical History:  Diagnosis Date  . Anxiety    hx of   . Arthritis    RA  . Asthma   . Clotting disorder (HCC)    DVT both legs   . COPD (chronic obstructive pulmonary disease) ([place])   . DDD (degenerative disc disease), cervical    with UE's paresthesias  . Diverticulosis   . DVT, lower extremity ([place])    bilat  . 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 GERD (gastroesophageal reflux disease)   . Gout   . Heart murmur   . History of measles   . History of shingles   . Hypercholesterolemia   . IBS (irritable bowel syndrome)   . IBS (irritable bowel syndrome)   . Peripheral edema   . Pneumonia    hx of   . Prostate cancer ([place])   . Seizures ([place])    last seizure 20 years ago; on dilantin.  Unknown origin.  [name] Shortness of breath dyspnea    exertion   . Sleep apnea    not on cpap  . Tubular adenoma of colon 04/2008    Medications:  (Not in a hospital admission)   Assessment: Pharmacy consulted to dose warfarin in patient with history of DVT.  INR on admission is subtherapeutic at 1.5.  Home dose of warfarin listed as 3 mg daily with last dose given 11/16  Patient is on levofloxacin which can elevate INR.  Goal of Therapy:  INR 2-3 Monitor platelets by anticoagulation protocol: Yes   Plan:  Warfarin 4 mg x 1 dose. Monitor daily INR and s/s of bleeding.   Margot Ables, PharmD Clinical Pharmacist 11/16/2019 2:52 PM

## 2019-11-17 DIAGNOSIS — J9601 Acute respiratory failure with hypoxia: Secondary | ICD-10-CM

## 2019-11-17 DIAGNOSIS — U071 COVID-19: Principal | ICD-10-CM

## 2019-11-17 LAB — CBC
HCT: 46.1 % (ref 39.0–52.0)
Hemoglobin: 15 g/dL (ref 13.0–17.0)
MCH: 29.7 pg (ref 26.0–34.0)
MCHC: 32.5 g/dL (ref 30.0–36.0)
MCV: 91.3 fL (ref 80.0–100.0)
Platelets: 120 10*3/uL — ABNORMAL LOW (ref 150–400)
RBC: 5.05 MIL/uL (ref 4.22–5.81)
RDW: 14.5 % (ref 11.5–15.5)
WBC: 3.8 10*3/uL — ABNORMAL LOW (ref 4.0–10.5)
nRBC: 0 % (ref 0.0–0.2)

## 2019-11-17 LAB — BASIC METABOLIC PANEL
Anion gap: 13 (ref 5–15)
BUN: 20 mg/dL (ref 8–23)
CO2: 27 mmol/L (ref 22–32)
Calcium: 8.6 mg/dL — ABNORMAL LOW (ref 8.9–10.3)
Chloride: 101 mmol/L (ref 98–111)
Creatinine, Ser: 1.43 mg/dL — ABNORMAL HIGH (ref 0.61–1.24)
GFR calc Af Amer: 58 mL/min — ABNORMAL LOW (ref >60)
GFR calc non Af Amer: 50 mL/min — ABNORMAL LOW (ref >60)
Glucose, Bld: 146 mg/dL — ABNORMAL HIGH (ref 70–99)
Potassium: 3.6 mmol/L (ref 3.5–5.1)
Sodium: 141 mmol/L (ref 135–145)

## 2019-11-17 LAB — PROTIME-INR
INR: 1.6 — ABNORMAL HIGH (ref 0.8–1.2)
Prothrombin Time: 19.2 seconds — ABNORMAL HIGH (ref 11.4–15.2)

## 2019-11-17 LAB — C-REACTIVE PROTEIN: CRP: 1 mg/dL — ABNORMAL HIGH (ref <1.0)

## 2019-11-17 LAB — HIV ANTIBODY (ROUTINE TESTING W REFLEX): HIV Screen 4th Generation wRfx: NONREACTIVE — AB

## 2019-11-17 LAB — STREP PNEUMONIAE URINARY ANTIGEN: Strep Pneumo Urinary Antigen: NEGATIVE

## 2019-11-17 LAB — FERRITIN: Ferritin: 396 ng/mL — ABNORMAL HIGH (ref 24–336)

## 2019-11-17 LAB — LACTATE DEHYDROGENASE: LDH: 310 U/L — ABNORMAL HIGH (ref 98–192)

## 2019-11-17 MED ORDER — ONDANSETRON HCL 4 MG/2ML IJ SOLN
4.0000 mg | Freq: Four times a day (QID) | INTRAMUSCULAR | Status: DC | PRN
  Administered 2019-11-17 – 2019-11-19 (×2): 4 mg via INTRAVENOUS
  Filled 2019-11-17 (×2): qty 2

## 2019-11-17 MED ORDER — WARFARIN SODIUM 2 MG PO TABS
4.0000 mg | ORAL_TABLET | Freq: Once | ORAL | Status: AC
  Administered 2019-11-17: 4 mg via ORAL
  Filled 2019-11-17: qty 2

## 2019-11-17 MED ORDER — ALPRAZOLAM 0.25 MG PO TABS
0.2500 mg | ORAL_TABLET | Freq: Once | ORAL | Status: AC
  Administered 2019-11-17: 0.25 mg via ORAL
  Filled 2019-11-17: qty 1

## 2019-11-17 MED ORDER — SODIUM CHLORIDE 0.9 % IV SOLN
100.0000 mg | Freq: Every day | INTRAVENOUS | Status: AC
  Administered 2019-11-18 – 2019-11-20 (×3): 100 mg via INTRAVENOUS
  Filled 2019-11-17 (×3): qty 100

## 2019-11-17 MED ORDER — ACETAMINOPHEN 325 MG PO TABS
650.0000 mg | ORAL_TABLET | Freq: Four times a day (QID) | ORAL | Status: DC | PRN
  Administered 2019-11-17: 650 mg via ORAL
  Filled 2019-11-17: qty 2

## 2019-11-17 MED ORDER — SODIUM CHLORIDE 0.9 % IV SOLN
100.0000 mg | INTRAVENOUS | Status: AC
  Administered 2019-11-17: 100 mg via INTRAVENOUS
  Filled 2019-11-17: qty 100

## 2019-11-17 MED ORDER — GUAIFENESIN-DM 100-10 MG/5ML PO SYRP
5.0000 mL | ORAL_SOLUTION | ORAL | Status: DC | PRN
  Administered 2019-11-17 – 2019-11-21 (×8): 5 mL via ORAL
  Filled 2019-11-17 (×7): qty 10

## 2019-11-17 MED ORDER — METHYLPREDNISOLONE SODIUM SUCC 125 MG IJ SOLR
60.0000 mg | Freq: Two times a day (BID) | INTRAMUSCULAR | Status: DC
  Administered 2019-11-17 – 2019-11-20 (×7): 60 mg via INTRAVENOUS
  Filled 2019-11-17 (×7): qty 2

## 2019-11-17 NOTE — Progress Notes (Addendum)
ANTICOAGULATION CONSULT NOTE -   Pharmacy Consult for Warfarin Indication: history of DVT  Allergies  Allergen Reactions  . Orencia [Abatacept] Anaphylaxis    Had prostate cancer  . Carbamazepine Rash    REACTION: makes drowsy BRAND NAME IS TEGRETOL  . Celecoxib Itching and Rash    BRAND NAME IS CELEBREX  . Cephalexin Rash    "Severe Rash".  BRAND NAME IS KEFLEX.   . Enbrel [Etanercept] Rash  . Humira [Adalimumab] Rash  . Levofloxacin Other (See Comments)    REACTION: GI Intolerance BRAND NAME IS LEVAQUIN  . Sulfa Antibiotics Rash  . Sulfasalazine Rash    Patient Measurements: Height: 5\' 8"  (172.7 cm) Weight: 218 lb 0.6 oz (98.9 kg) IBW/kg (Calculated) : 68.4   Vital Signs: Temp: 98 F (36.7 C) (11/18 0051) Temp Source: Oral (11/18 0051)  Labs: Recent Labs    11/16/19 0915 11/16/19 1244 11/17/19 0535  HGB 15.4  --  15.0  HCT 48.1  --  46.1  PLT 108*  --  120*  LABPROT  --  17.6* 19.2*  INR  --  1.5* 1.6*  CREATININE 1.38*  --  1.43*  TROPONINIHS 17 17  --     Estimated Creatinine Clearance: 55.6 mL/min (A) (by C-G formula based on SCr of 1.43 mg/dL (H)).   Medical History: Past Medical History:  Diagnosis Date  . Anxiety    hx of   . Arthritis    RA  . Asthma   . CHF (congestive heart failure) ([place])   . Clotting disorder (HCC)    DVT both legs   . COPD (chronic obstructive pulmonary disease) ([place])   . DDD (degenerative disc disease), cervical    with UE's paresthesias  . Diverticulosis   . DVT, lower extremity ([place])    bilat  . 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 GERD (gastroesophageal reflux disease)   . Gout   . Heart murmur   . History of measles   . History of shingles   . Hypercholesterolemia   . Hypertension   . IBS (irritable bowel syndrome)   . IBS (irritable bowel syndrome)   . Peripheral edema   . Pneumonia    hx of   . Prostate cancer ([place])   . Seizures ([place])     last seizure 20 years ago; on dilantin. Unknown origin.  [name] Shortness of breath dyspnea    exertion   . Sleep apnea    not on cpap  . Tubular adenoma of colon 04/2008    Medications:  Medications Prior to Admission  Medication Sig Dispense Refill Last Dose  . acetaminophen (TYLENOL) 500 MG tablet Take 1,000 mg by mouth every 6 (six) hours as needed (arthritis pain).     [name] albuterol (PROVENTIL) (2.5 MG/3ML) 0.083% nebulizer solution Take 3 mLs (2.5 mg total) by nebulization every 6 (six) hours as needed for wheezing or shortness of breath. 75 mL 12 11/16/2019 at Unknown time  . alendronate (FOSAMAX) 70 MG tablet TAKE 1 TABLET BY MOUTH ONCE A WEEK. TAKE WITH FULL GLASS OF WATER ON EMPTY STOMACH 12 tablet 0 Past Week at Unknown time  . allopurinol (ZYLOPRIM) 100 MG tablet TAKE 1 TABLET BY MOUTH TWICE A DAY 180 tablet 0 11/15/2019 at Unknown time  . BREO ELLIPTA 100-25 MCG/INH AEPB USE 1 INHALATION ORALLY    DAILY 180 each 1 11/15/2019 at Unknown time  .  Calcium-Phosphorus-Vitamin D (CITRACAL +D3 PO) Take 2 tablets by mouth 2 (two) times daily.    11/15/2019 at Unknown time  . diclofenac sodium (VOLTAREN) 1 % GEL Apply 3 grams to 3 large joints up to 3 times daily (Patient taking differently: Apply 3 g topically 3 (three) times daily as needed (pain). Apply 3 grams to 3 large joints up to 3 times daily) 3 Tube 3   . dicyclomine (BENTYL) 10 MG capsule TAKE 1 CAPSULE BY MOUTH 4 TIMES DAILY BEFORE MEALS AND AT BEDTIME (Patient taking differently: Take 20 mg by mouth 2 (two) times daily. ) 120 capsule 0 11/15/2019 at Unknown time  . folic acid (FOLVITE) 1 MG tablet TAKE 2 TABLETS BY MOUTH EVERY DAY IN THE MORNING (Patient taking differently: Take 2 mg by mouth daily. ) 180 tablet 4 11/15/2019 at Unknown time  . furosemide (LASIX) 40 MG tablet Take 1 tablet (40 mg total) by mouth 2 (two) times daily. 60 tablet 11 11/15/2019 at Unknown time  . INCRUSE ELLIPTA 62.5 MCG/INH AEPB TAKE 1 PUFF BY MOUTH EVERY  DAY 30 each 5 11/15/2019 at Unknown time  . omeprazole (PRILOSEC) 40 MG capsule Take 40 mg by mouth at bedtime.   2 11/15/2019 at Unknown time  . phenytoin (DILANTIN) 100 MG ER capsule Take 100-200 mg by mouth See admin instructions. Take one capsule in the morning and 2 capsules at bedtime   11/15/2019 at Unknown time  . Potassium Chloride ER 20 MEQ TBCR Take 20 mEq by mouth daily.    11/15/2019 at Unknown time  . Probiotic Product (PROBIOTIC DAILY) CAPS Take 1 capsule by mouth daily.   11/15/2019 at Unknown time  . Tocilizumab (ACTEMRA IV) Inject into the vein every 28 (twenty-eight) days. For Rheumatoid Arthritis   Past Month at Unknown time  . warfarin (COUMADIN) 3 MG tablet Take 3 mg by mouth at bedtime.    11/15/2019 at 0200  . leflunomide (ARAVA) 20 MG tablet TAKE 1 TABLET BY MOUTH EVERY DAY (Patient not taking: Reported on 11/16/2019) 90 tablet 0 Not Taking at Unknown time    Assessment: Pharmacy consulted to dose warfarin in patient with history of DVT.  INR on admission is subtherapeutic at 1.5. INR remains subtherapeutic at 1.6.   Home dose of warfarin listed as 3 mg daily with last dose given 11/16  Goal of Therapy:  INR 2-3 Monitor platelets by anticoagulation protocol: Yes   Plan:  Warfarin 4 mg x 1 dose. Monitor daily INR and s/s of bleeding.  Isac Sarna, BS [place], [place] Clinical Pharmacist Pager [phone] 11/17/2019 12:14 PM

## 2019-11-17 NOTE — Progress Notes (Signed)
PROGRESS NOTE  [name] [id number] DOB: [birth year] DOA: 11/16/2019 PCP: Mark Sites, MD  Brief History:  68 year old male with a history of remote arthritis, COPD, lower extremity DVTs, seizure disorder, prostate cancer presenting with 1 day history of shortness of breath, nonproductive cough, and generalized weakness.  The patient noted some wheezing that began on on 11/16/2019.  He states that he has chronic lower extremity edema which is about the same as usual.  In the emergency department, the patient was noted to have oxygen saturation of 88% room air.  Chest x-ray showed increased interstitial markings and right upper lobe opacity.  He was placed on 2 L nasal cannula with improvement of her oxygen saturation and 94-95%.  The patient was initially started on levofloxacin for presumptive community-acquired pneumonia prior to his Covid test coming back positive.  In addition, the patient states that he has had hematochezia for 1 day.  He has spoken with his gastroenterologist, [name] in the past.  He states that this is typical of his hemorrhoidal bleeding in the past that has been waxing and waning every 3 to 4 weeks.  He states that he was told to use Anusol suppositories in the past when this occurs.  Assessment/[name]: Acute respiratory failure secondary to Covid 19 pneumonia -Presently stable on 2 L nasal cannula -Continue remdesivir -Continue Solu-Medrol -Repeat inflammatory markers including CRP, ferritin, LDH -Initial CRP 1.0, PCT <0.10 -discontinue levoflox  COPD exacerbation -Continue IV Solu-Medrol -Continue Combivent -Continue Breo  Hematochezia -Likely secondary to hemorrhoidal bleed -Start Anusol suppositories -07/17/2016 colonoscopy--mild diverticulosis, grade 1 internal hemorrhoids -Baseline hemoglobin 14-15 -Presenting Hgb 15.4-->15.0  CKD stage 3 -baseline creatinine 1.1-1.4  Lower extremity DVT history -Patient endorses compliance with  warfarin -Continue warfarin for now monitoring his hematochezia closely  Rheumatoid arthritis -holding arava  -pt did not recall dated of last Actemra infusion  Seizure disorder -Continue Dilantin  GERD -Continue PPI       Disposition [name]:   Transfer to [facility] Family Communication:   No Family at bedside  Consultants:  none  Code Status:  FULL  DVT Prophylaxis:  warfarin   Procedures: As Listed in Progress Note Above  Antibiotics: None      Subjective: Patient states that his breathing is a little better than yesterday.  He denies any headache, chest pain, hemoptysis, vomiting, diarrhea, abdominal pain, dysuria, hematuria.  He has not had any further hematochezia.  Objective: Vitals:   11/16/19 1837 11/16/19 2100 11/17/19 0051 11/17/19 0147  BP:  (!) 145/87    Pulse: 79 90    Resp: 19 20    Temp:  (!) 96.8 F (36 C) 98 F (36.7 C)   TempSrc:  Oral Oral   SpO2: 98% 96%  95%  Weight:  98.9 kg    Height:  5\' 8"  (1.727 m)      Intake/Output Summary (Last 24 hours) at 11/17/2019 0751 Last data filed at 11/17/2019 0300 Gross per 24 hour  Intake 358.49 ml  Output 50 ml  Net 308.49 ml   Weight change:  Exam:   General:  Pt is alert, follows commands appropriately, not in acute distress  HEENT: No icterus, No thrush, No neck mass, /AT  Cardiovascular: RRR, S1/S2, no rubs, no gallops  Respiratory: Bibasilar rales.  Bibasilar acutely wheeze.  Good air movement  Abdomen: Soft/+BS, non tender, non distended, no guarding  Extremities: 1 + LE edema, No lymphangitis, No petechiae, No  rashes, no synovitis   Data Reviewed: I have personally reviewed following labs and imaging studies Basic Metabolic Panel: Recent Labs  Lab 11/16/19 0915 11/17/19 0535  NA 142 141  K 3.0* 3.6  CL 100 101  CO2 28 27  GLUCOSE 125* 146*  BUN 12 20  CREATININE 1.38* 1.43*  CALCIUM 8.7* 8.6*   Liver Function Tests: Recent Labs  Lab 11/16/19 0915  AST 64*   ALT 44  ALKPHOS 66  BILITOT 1.0  PROT 7.1  ALBUMIN 4.1   No results for input(s): LIPASE, AMYLASE in the last 168 hours. No results for input(s): AMMONIA in the last 168 hours. Coagulation Profile: Recent Labs  Lab 11/16/19 1244 11/17/19 0535  INR 1.5* 1.6*   CBC: Recent Labs  Lab 11/16/19 0915 11/17/19 0535  WBC 4.6 3.8*  NEUTROABS 2.8  --   HGB 15.4 15.0  HCT 48.1 46.1  MCV 91.3 91.3  PLT 108* 120*   Cardiac Enzymes: No results for input(s): CKTOTAL, CKMB, CKMBINDEX, TROPONINI in the last 168 hours. BNP: Invalid input(s): POCBNP CBG: No results for input(s): GLUCAP in the last 168 hours. HbA1C: No results for input(s): HGBA1C in the last 72 hours. Urine analysis:    Component Value Date/Time   COLORURINE YELLOW 11/29/2018 1033   APPEARANCEUR HAZY (A) 11/29/2018 1033   LABSPEC 1.025 11/29/2018 1033   PHURINE 5.0 11/29/2018 1033   GLUCOSEU NEGATIVE 11/29/2018 1033   HGBUR MODERATE (A) 11/29/2018 1033   BILIRUBINUR NEGATIVE 11/29/2018 1033   KETONESUR NEGATIVE 11/29/2018 1033   PROTEINUR 30 (A) 11/29/2018 1033   UROBILINOGEN 0.2 09/24/2012 1640   NITRITE NEGATIVE 11/29/2018 1033   LEUKOCYTESUR NEGATIVE 11/29/2018 1033   Sepsis Labs: @LABRCNTIP (procalcitonin:4,lacticidven:4) ) Recent Results (from the past 240 hour(s))  SARS CORONAVIRUS 2 ([name] 6-24 HRS) Nasopharyngeal Nasopharyngeal Swab     Status: Abnormal   Collection Time: 11/16/19  9:09 AM   Specimen: Nasopharyngeal Swab  Result Value Ref Range Status   SARS Coronavirus 2 POSITIVE (A) NEGATIVE Final    Comment: RESULT CALLED TO, READ BACK BY AND VERIFIED WITH: B JOHNSON,RN 2019 11/16/2019 D BRADLEY (NOTE) SARS-CoV-2 target nucleic acids are DETECTED. The SARS-CoV-2 RNA is generally detectable in upper and lower respiratory specimens during the acute phase of infection. Positive results are indicative of active infection with SARS-CoV-2. Clinical  correlation with patient history and other  diagnostic information is necessary to determine patient infection status. Positive results do  not rule out bacterial infection or co-infection with other viruses. The expected result is Negative. Fact Sheet for Patients: SugarRoll.be Fact Sheet for Healthcare Providers: https://www.woods-mathews.com/ This test is not yet approved or cleared by the [place] FDA and  has been authorized for detection and/or diagnosis of SARS-CoV-2 by FDA under an Emergency Use Authorization (EUA). This EUA will remain  in effect (meaning this test can be used) fo r the duration of the COVID-19 declaration under Section 564(b)(1) of the Act, 21 U.S.C. section 360bbb-3(b)(1), unless the authorization is terminated or revoked sooner. Performed at [facility], [place] [address], [place], [place] [address]   Blood Culture (routine x 2)     Status: None (Preliminary result)   Collection Time: 11/16/19 10:01 AM   Specimen: BLOOD RIGHT HAND  Result Value Ref Range Status   Specimen Description BLOOD RIGHT HAND  Final   Special Requests   Final    BOTTLES DRAWN AEROBIC AND ANAEROBIC Blood Culture results may not be optimal due to an inadequate  volume of blood received in culture bottles   Culture   Final    NO GROWTH < 24 HOURS Performed at [facility], [address] [place], [place] [address]    Report Status PENDING  Incomplete  Blood Culture (routine x 2)     Status: None (Preliminary result)   Collection Time: 11/16/19 10:02 AM   Specimen: BLOOD LEFT ARM  Result Value Ref Range Status   Specimen Description BLOOD LEFT ARM  Final   Special Requests   Final    BOTTLES DRAWN AEROBIC AND ANAEROBIC Blood Culture results may not be optimal due to an inadequate volume of blood received in culture bottles   Culture   Final    NO GROWTH < 24 HOURS Performed at [facility], [address] [place], [place] [address]    Report Status PENDING  Incomplete      Scheduled Meds: . acidophilus  1 capsule Oral Daily  . allopurinol  100 mg Oral BID  . dicyclomine  20 mg Oral BID  . fluticasone furoate-vilanterol  1 puff Inhalation Daily  . hydrocortisone   Rectal BID  . Ipratropium-Albuterol  1 puff Inhalation Q6H  . methylPREDNISolone (SOLU-MEDROL) injection  40 mg Intravenous Q8H  . pantoprazole  40 mg Oral Daily  . phenytoin  100 mg Oral q morning - 10a  . phenytoin  200 mg Oral QHS  . Warfarin - Pharmacist Dosing Inpatient   Does not apply q1800   Continuous Infusions: . sodium chloride Stopped (11/16/19 1416)  . levofloxacin (LEVAQUIN) IV      Procedures/Studies: Dg Chest Port 1 View  Result Date: 11/16/2019 CLINICAL DATA:  Shortness of breath, cold symptoms for a week, productive cough, fatigue, intermittent chest pain, decreased oxygen saturation, worsening shortness of breath this morning, history prostate cancer, former smoker, asthma EXAM: PORTABLE CHEST 1 VIEW COMPARISON:  Portable exam 0929 hours compared to 06/07/2019 FINDINGS: Chronic elevation LEFT diaphragm. Upper normal heart size with slight vascular congestion. Mediastinal contours normal. Atherosclerotic calcification aorta. LEFT basilar atelectasis. Mild infiltrate RIGHT upper lobe question pneumonia. No pleural effusion or pneumothorax. IMPRESSION: Suspected RIGHT upper lobe pneumonia. Chronic elevation of LEFT diaphragm with LEFT basilar atelectasis. Electronically Signed   By: Lavonia Dana M.D.   On: 11/16/2019 09:55    Orson Eva, DO  [facility] Pager [phone]  If 7PM-7AM, please contact night-coverage [email or web address] Password [email or web address] 11/17/2019, 7:51 AM   LOS: 1 day

## 2019-11-17 NOTE — Progress Notes (Signed)
Pt report given to Donato Heinz of [place]. States will be here to get patient in approx 30 minutes. Pt report also called to [facility], nurse Iver Nestle, RN.

## 2019-11-17 NOTE — Plan of Care (Signed)
Pt arrived at [facility] via [place] no signs of distress will continue to monitor patient

## 2019-11-18 LAB — COMPREHENSIVE METABOLIC PANEL
ALT: 41 U/L (ref 0–44)
AST: 69 U/L — ABNORMAL HIGH (ref 15–41)
Albumin: 3.6 g/dL (ref 3.5–5.0)
Alkaline Phosphatase: 52 U/L (ref 38–126)
Anion gap: 12 (ref 5–15)
BUN: 25 mg/dL — ABNORMAL HIGH (ref 8–23)
CO2: 26 mmol/L (ref 22–32)
Calcium: 8.5 mg/dL — ABNORMAL LOW (ref 8.9–10.3)
Chloride: 101 mmol/L (ref 98–111)
Creatinine, Ser: 1.28 mg/dL — ABNORMAL HIGH (ref 0.61–1.24)
GFR calc Af Amer: >60 mL/min (ref >60)
GFR calc non Af Amer: 57 mL/min — ABNORMAL LOW (ref >60)
Glucose, Bld: 144 mg/dL — ABNORMAL HIGH (ref 70–99)
Potassium: 3.9 mmol/L (ref 3.5–5.1)
Sodium: 139 mmol/L (ref 135–145)
Total Bilirubin: 0.8 mg/dL (ref 0.3–1.2)
Total Protein: 6.2 g/dL — ABNORMAL LOW (ref 6.5–8.1)

## 2019-11-18 LAB — CBC
HCT: 43.7 % (ref 39.0–52.0)
Hemoglobin: 14.4 g/dL (ref 13.0–17.0)
MCH: 30 pg (ref 26.0–34.0)
MCHC: 33 g/dL (ref 30.0–36.0)
MCV: 91 fL (ref 80.0–100.0)
Platelets: 122 10*3/uL — ABNORMAL LOW (ref 150–400)
RBC: 4.8 MIL/uL (ref 4.22–5.81)
RDW: 14.6 % (ref 11.5–15.5)
WBC: 5.7 10*3/uL (ref 4.0–10.5)
nRBC: 0 % (ref 0.0–0.2)

## 2019-11-18 LAB — PROTIME-INR
INR: 2 — ABNORMAL HIGH (ref 0.8–1.2)
Prothrombin Time: 22.7 seconds — ABNORMAL HIGH (ref 11.4–15.2)

## 2019-11-18 LAB — C-REACTIVE PROTEIN: CRP: <0.8 mg/dL (ref <1.0)

## 2019-11-18 LAB — MAGNESIUM: Magnesium: 2 mg/dL (ref 1.7–2.4)

## 2019-11-18 LAB — D-DIMER, QUANTITATIVE: D-Dimer, Quant: 0.44 ug/mL-FEU (ref 0.00–0.50)

## 2019-11-18 MED ORDER — FUROSEMIDE 20 MG PO TABS
40.0000 mg | ORAL_TABLET | Freq: Every day | ORAL | Status: DC
  Administered 2019-11-18 – 2019-11-23 (×6): 40 mg via ORAL
  Filled 2019-11-18 (×6): qty 2

## 2019-11-18 MED ORDER — CALCIUM CARBONATE-VITAMIN D 500-200 MG-UNIT PO TABS
2.0000 | ORAL_TABLET | Freq: Every day | ORAL | Status: DC
  Administered 2019-11-18 – 2019-11-22 (×5): 2 via ORAL
  Filled 2019-11-18 (×6): qty 2

## 2019-11-18 MED ORDER — WARFARIN SODIUM 4 MG PO TABS
4.0000 mg | ORAL_TABLET | Freq: Once | ORAL | Status: AC
  Administered 2019-11-18: 4 mg via ORAL
  Filled 2019-11-18: qty 1

## 2019-11-18 MED ORDER — DICYCLOMINE HCL 20 MG PO TABS
20.0000 mg | ORAL_TABLET | Freq: Two times a day (BID) | ORAL | Status: DC
  Administered 2019-11-18 – 2019-11-23 (×11): 20 mg via ORAL
  Filled 2019-11-18 (×13): qty 1

## 2019-11-18 MED ORDER — POTASSIUM CHLORIDE CRYS ER 20 MEQ PO TBCR
20.0000 meq | EXTENDED_RELEASE_TABLET | Freq: Every day | ORAL | Status: DC
  Administered 2019-11-18 – 2019-11-23 (×6): 20 meq via ORAL
  Filled 2019-11-18 (×6): qty 1

## 2019-11-18 MED ORDER — CALCIUM CARBONATE-VITAMIN D 500-200 MG-UNIT PO TABS
2.0000 | ORAL_TABLET | Freq: Two times a day (BID) | ORAL | Status: DC
  Filled 2019-11-18 (×2): qty 2

## 2019-11-18 MED ORDER — FOLIC ACID 1 MG PO TABS
2.0000 mg | ORAL_TABLET | Freq: Every day | ORAL | Status: DC
  Administered 2019-11-18 – 2019-11-23 (×6): 2 mg via ORAL
  Filled 2019-11-18 (×6): qty 2

## 2019-11-18 NOTE — Progress Notes (Signed)
Physician notified of 5 beat run VT. Pt asymptomatic

## 2019-11-18 NOTE — Progress Notes (Signed)
PROGRESS NOTE  [name] [id number] DOB: [birth year] DOA: 11/16/2019 PCP: [name], MD  Brief History:   68 year old male with a history of remote arthritis, COPD, lower extremity DVTs, seizure disorder, prostate cancer presenting with 1 day history of shortness of breath, nonproductive cough, and generalized weakness.  The patient noted some wheezing that began on on 11/16/2019.  He states that he has chronic lower extremity edema which is about the same as usual.  In the emergency department, the patient was noted to have oxygen saturation of 88% room air.  Chest x-ray showed increased interstitial markings and right upper lobe opacity.  He was placed on 2 L nasal cannula with improvement of her oxygen saturation and 94-95%.  The patient was initially started on levofloxacin for presumptive community-acquired pneumonia prior to his Covid test coming back positive.  In addition, the patient states that he has had hematochezia for 1 day.  He has spoken with his gastroenterologist, Dr. Fuller Plan in the past.  He states that this is typical of his hemorrhoidal bleeding in the past that has been waxing and waning every 3 to 4 weeks.  He states that he was told to use Anusol suppositories in the past when this occurs.  Subjective: Patient states dyspnea has improved at rest, still significant on exertion, he denies any hemoptysis, chest pain, diarrhea, no further blood in stools .  Assessment/Plan: Acute respiratory failure secondary to Covid 19 pneumonia -Patient remains on 2 L nasal cannula this morning, but he appears to be more comfortable, no apparent distress, was able to room on a chair with no significant hypoxia. -Continue with IV remdesivir -Continue with IV Solu-Medrol -Continue to trend inflammatory markers, they are on the lower side, but this might be deceiving given he is on Actemra for rheumatoid arthritis with most recent dose 10/24. -Procalcitonin within normal  limit, levofloxacin has been stopped  COVID-19 Labs  Recent Labs    11/16/19 0915 11/16/19 1001 11/17/19 0535 11/17/19 0636 11/18/19 0322  DDIMER  --  0.68*  --   --  0.44  FERRITIN 420*  --   --  396*  --   LDH 323*  --  310*  --   --   CRP 1.0*  --   --  1.0* <0.8    Lab Results  Component Value Date   SARSCOV2NAA POSITIVE (A) 11/16/2019   [place] NEGATIVE 06/07/2019   [place] NEGATIVE 06/02/2019     COPD exacerbation -appears  to be improving, less dyspneic with less wheezing today -Continue IV Solu-Medrol -Continue Combivent -Continue Breo  Hematochezia -Likely secondary to hemorrhoidal bleed -Start Anusol suppositories -07/17/2016 colonoscopy--mild diverticulosis, grade 1 internal hemorrhoids -Baseline hemoglobin 14-15 -Presenting Hgb 15.4-->15.0  CKD stage 3 -baseline creatinine 1.1-1.4  Lower extremity DVT history -Patient endorses compliance with warfarin -Continue warfarin for now monitoring his hematochezia closely  Rheumatoid arthritis -holding arava  -pt did not recall dated of last Actemra infusion  Seizure disorder -Continue Dilantin  GERD -Continue PPI       Disposition Plan:   Home once stable  Family Communication:   Cussed with patient  Consultants:  none  Code Status:  FULL  DVT Prophylaxis:  warfarin   Procedures: As Listed in Progress Note Above  Antibiotics: None       Objective: Vitals:   11/18/19 0405 11/18/19 0500 11/18/19 0744 11/18/19 1600  BP: (!) 152/98  134/70 138/88  Pulse: 82  71  Resp: 18  17   Temp: 97.6 F (36.4 C)  98.4 F (36.9 C) 97.9 F (36.6 C)  TempSrc: Oral  Oral Oral  SpO2: 97%  97%   Weight:  98.7 kg    Height:        Intake/Output Summary (Last 24 hours) at 11/18/2019 1707 Last data filed at 11/18/2019 1300 Gross per 24 hour  Intake 470 ml  Output 550 ml  Net -80 ml   Weight change: -1.091 kg Exam:  Awake Alert, Oriented X 3, No new F.N deficits, Normal  affect Symmetrical Chest wall movement, Good air movement bilaterally, mild wheezing bilaterally RRR,No Gallops,Rubs or new Murmurs, No Parasternal Heave +ve B.Sounds, Abd Soft, No tenderness, No rebound - guarding or rigidity. No Cyanosis, Clubbing , trace edema, No new Rash or bruise     Data Reviewed: I have personally reviewed following labs and imaging studies Basic Metabolic Panel: Recent Labs  Lab 11/16/19 0915 11/17/19 0535 11/18/19 0322  NA 142 141 139  K 3.0* 3.6 3.9  CL 100 101 101  CO2 28 27 26   GLUCOSE 125* 146* 144*  BUN 12 20 25*  CREATININE 1.38* 1.43* 1.28*  CALCIUM 8.7* 8.6* 8.5*   Liver Function Tests: Recent Labs  Lab 11/16/19 0915 11/18/19 0322  AST 64* 69*  ALT 44 41  ALKPHOS 66 52  BILITOT 1.0 0.8  PROT 7.1 6.2*  ALBUMIN 4.1 3.6   No results for input(s): LIPASE, AMYLASE in the last 168 hours. No results for input(s): AMMONIA in the last 168 hours. Coagulation Profile: Recent Labs  Lab 11/16/19 1244 11/17/19 0535 11/18/19 0322  INR 1.5* 1.6* 2.0*   CBC: Recent Labs  Lab 11/16/19 0915 11/17/19 0535 11/18/19 0322  WBC 4.6 3.8* 5.7  NEUTROABS 2.8  --   --   HGB 15.4 15.0 14.4  HCT 48.1 46.1 43.7  MCV 91.3 91.3 91.0  PLT 108* 120* 122*   Cardiac Enzymes: No results for input(s): CKTOTAL, CKMB, CKMBINDEX, TROPONINI in the last 168 hours. BNP: Invalid input(s): POCBNP CBG: No results for input(s): GLUCAP in the last 168 hours. HbA1C: No results for input(s): HGBA1C in the last 72 hours. Urine analysis:    Component Value Date/Time   COLORURINE YELLOW 11/29/2018 1033   APPEARANCEUR HAZY (A) 11/29/2018 1033   LABSPEC 1.025 11/29/2018 1033   PHURINE 5.0 11/29/2018 1033   GLUCOSEU NEGATIVE 11/29/2018 1033   HGBUR MODERATE (A) 11/29/2018 1033   BILIRUBINUR NEGATIVE 11/29/2018 1033   KETONESUR NEGATIVE 11/29/2018 1033   PROTEINUR 30 (A) 11/29/2018 1033   UROBILINOGEN 0.2 09/24/2012 1640   NITRITE NEGATIVE 11/29/2018 1033    LEUKOCYTESUR NEGATIVE 11/29/2018 1033   Sepsis Labs: @LABRCNTIP (procalcitonin:4,lacticidven:4) ) Recent Results (from the past 240 hour(s))  SARS CORONAVIRUS 2 (TAT 6-24 HRS) Nasopharyngeal Nasopharyngeal Swab     Status: Abnormal   Collection Time: 11/16/19  9:09 AM   Specimen: Nasopharyngeal Swab  Result Value Ref Range Status   SARS Coronavirus 2 POSITIVE (A) NEGATIVE Final    Comment: RESULT CALLED TO, READ BACK BY AND VERIFIED WITH: B JOHNSON,RN 2019 11/16/2019 D BRADLEY (NOTE) SARS-CoV-2 target nucleic acids are DETECTED. The SARS-CoV-2 RNA is generally detectable in upper and lower respiratory specimens during the acute phase of infection. Positive results are indicative of active infection with SARS-CoV-2. Clinical  correlation with patient history and other diagnostic information is necessary to determine patient infection status. Positive results do  not rule out bacterial infection or co-infection with other viruses.  The expected result is Negative. Fact Sheet for Patients: SugarRoll.be Fact Sheet for Healthcare Providers: https://www.woods-mathews.com/ This test is not yet approved or cleared by the [place] FDA and  has been authorized for detection and/or diagnosis of SARS-CoV-2 by FDA under an Emergency Use Authorization (EUA). This EUA will remain  in effect (meaning this test can be used) fo r the duration of the COVID-19 declaration under Section 564(b)(1) of the Act, 21 U.S.C. section 360bbb-3(b)(1), unless the authorization is terminated or revoked sooner. Performed at [facility], [place] [address] [place], [place] [address]   Blood Culture (routine x 2)     Status: None (Preliminary result)   Collection Time: 11/16/19 10:01 AM   Specimen: BLOOD RIGHT HAND  Result Value Ref Range Status   Specimen Description BLOOD RIGHT HAND  Final   Special Requests   Final    BOTTLES DRAWN AEROBIC AND ANAEROBIC Blood  Culture results may not be optimal due to an inadequate volume of blood received in culture bottles   Culture   Final    NO GROWTH 2 DAYS Performed at [facility], [address] [place], [place] [address]    Report Status PENDING  Incomplete  Blood Culture (routine x 2)     Status: None (Preliminary result)   Collection Time: 11/16/19 10:02 AM   Specimen: BLOOD LEFT ARM  Result Value Ref Range Status   Specimen Description BLOOD LEFT ARM  Final   Special Requests   Final    BOTTLES DRAWN AEROBIC AND ANAEROBIC Blood Culture results may not be optimal due to an inadequate volume of blood received in culture bottles   Culture   Final    NO GROWTH 2 DAYS Performed at [facility], [address], [place], [place] [address]    Report Status PENDING  Incomplete     Scheduled Meds: . acidophilus  1 capsule Oral Daily  . allopurinol  100 mg Oral BID  . calcium-vitamin D  2 tablet Oral QHS  . dicyclomine  20 mg Oral BID  . fluticasone furoate-vilanterol  1 puff Inhalation Daily  . folic acid  2 mg Oral Daily  . furosemide  40 mg Oral Daily  . hydrocortisone   Rectal BID  . Ipratropium-Albuterol  1 puff Inhalation Q6H  . methylPREDNISolone (SOLU-MEDROL) injection  60 mg Intravenous Q12H  . pantoprazole  40 mg Oral Daily  . phenytoin  100 mg Oral q morning - 10a  . phenytoin  200 mg Oral QHS  . potassium chloride SA  20 mEq Oral Daily  . Warfarin - Pharmacist Dosing Inpatient   Does not apply q1800   Continuous Infusions: . sodium chloride Stopped (11/16/19 1416)  . remdesivir 100 mg in NS 250 mL 100 mg (11/18/19 1654)    Procedures/Studies: Dg Chest Port 1 View  Result Date: 11/16/2019 CLINICAL DATA:  Shortness of breath, cold symptoms for a week, productive cough, fatigue, intermittent chest pain, decreased oxygen saturation, worsening shortness of breath this morning, history prostate cancer, former smoker, asthma EXAM: PORTABLE CHEST 1 VIEW COMPARISON:  Portable exam 0929  hours compared to 06/07/2019 FINDINGS: Chronic elevation LEFT diaphragm. Upper normal heart size with slight vascular congestion. Mediastinal contours normal. Atherosclerotic calcification aorta. LEFT basilar atelectasis. Mild infiltrate RIGHT upper lobe question pneumonia. No pleural effusion or pneumothorax. IMPRESSION: Suspected RIGHT upper lobe pneumonia. Chronic elevation of LEFT diaphragm with LEFT basilar atelectasis. Electronically Signed   By: Lavonia Dana M.D.   On: 11/16/2019  09:[place], MD  [facility]   If 7PM-7AM, please contact night-coverage [email or web address] Password [email or web address] 11/18/2019, 5:07 PM   LOS: 2 days

## 2019-11-18 NOTE — Progress Notes (Addendum)
Wife Bethena Roys called and updated on patient status and plan of care at 2030. Visitors and belongings policies explained. All questions answered.

## 2019-11-18 NOTE — Plan of Care (Signed)

## 2019-11-18 NOTE — Progress Notes (Signed)
ANTICOAGULATION CONSULT NOTE -   Pharmacy Consult for Warfarin Indication: history of DVT  Allergies  Allergen Reactions  . Orencia [Abatacept] Anaphylaxis    Had prostate cancer  . Carbamazepine Rash    REACTION: makes drowsy BRAND NAME IS TEGRETOL  . Celecoxib Itching and Rash    BRAND NAME IS CELEBREX  . Cephalexin Rash    "Severe Rash".  BRAND NAME IS KEFLEX.   . Enbrel [Etanercept] Rash  . Humira [Adalimumab] Rash  . Levofloxacin Other (See Comments)    REACTION: GI Intolerance BRAND NAME IS LEVAQUIN  . Sulfa Antibiotics Rash  . Sulfasalazine Rash    Patient Measurements: Height: 5\' 8"  (172.7 cm) Weight: 217 lb 9.5 oz (98.7 kg) IBW/kg (Calculated) : 68.4   Vital Signs: Temp: 98.4 F (36.9 C) (11/19 0744) Temp Source: Oral (11/19 0744) BP: 134/70 (11/19 0744) Pulse Rate: 71 (11/19 0744)  Labs: Recent Labs    11/16/19 0915 11/16/19 1244 11/17/19 0535 11/18/19 0322  HGB 15.4  --  15.0 14.4  HCT 48.1  --  46.1 43.7  PLT 108*  --  120* 122*  LABPROT  --  17.6* 19.2* 22.7*  INR  --  1.5* 1.6* 2.0*  CREATININE 1.38*  --  1.43* 1.28*  TROPONINIHS 17 17  --   --     Estimated Creatinine Clearance: 62 mL/min (A) (by C-G formula based on SCr of 1.28 mg/dL (H)).   Medical History: Past Medical History:  Diagnosis Date  . Anxiety    hx of   . Arthritis    RA  . Asthma   . CHF (congestive heart failure) ([place])   . Clotting disorder (HCC)    DVT both legs   . COPD (chronic obstructive pulmonary disease) ([place])   . DDD (degenerative disc disease), cervical    with UE's paresthesias  . Diverticulosis   . DVT, lower extremity ([place])    bilat  . 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 GERD (gastroesophageal reflux disease)   . Gout   . Heart murmur   . History of measles   . History of shingles   . Hypercholesterolemia   . Hypertension   . IBS (irritable bowel syndrome)   . IBS (irritable  bowel syndrome)   . Peripheral edema   . Pneumonia    hx of   . Prostate cancer ([place])   . Seizures ([place])    last seizure 20 years ago; on dilantin. Unknown origin.  [name] Shortness of breath dyspnea    exertion   . Sleep apnea    not on cpap  . Tubular adenoma of colon 04/2008    Medications:  Medications Prior to Admission  Medication Sig Dispense Refill Last Dose  . acetaminophen (TYLENOL) 500 MG tablet Take 1,000 mg by mouth every 6 (six) hours as needed (arthritis pain).     [name] albuterol (PROVENTIL) (2.5 MG/3ML) 0.083% nebulizer solution Take 3 mLs (2.5 mg total) by nebulization every 6 (six) hours as needed for wheezing or shortness of breath. 75 mL 12 11/16/2019 at Unknown time  . alendronate (FOSAMAX) 70 MG tablet TAKE 1 TABLET BY MOUTH ONCE A WEEK. TAKE WITH FULL GLASS OF WATER ON EMPTY STOMACH 12 tablet 0 Past Week at Unknown time  . allopurinol (ZYLOPRIM) 100 MG tablet TAKE 1 TABLET BY MOUTH TWICE A DAY 180 tablet 0 11/15/2019 at Unknown time  . BREO ELLIPTA  100-25 MCG/INH AEPB USE 1 INHALATION ORALLY    DAILY 180 each 1 11/15/2019 at Unknown time  . Calcium-Phosphorus-Vitamin D (CITRACAL +D3 PO) Take 2 tablets by mouth 2 (two) times daily.    11/15/2019 at Unknown time  . diclofenac sodium (VOLTAREN) 1 % GEL Apply 3 grams to 3 large joints up to 3 times daily (Patient taking differently: Apply 3 g topically 3 (three) times daily as needed (pain). Apply 3 grams to 3 large joints up to 3 times daily) 3 Tube 3   . dicyclomine (BENTYL) 10 MG capsule TAKE 1 CAPSULE BY MOUTH 4 TIMES DAILY BEFORE MEALS AND AT BEDTIME (Patient taking differently: Take 20 mg by mouth 2 (two) times daily. ) 120 capsule 0 11/15/2019 at Unknown time  . folic acid (FOLVITE) 1 MG tablet TAKE 2 TABLETS BY MOUTH EVERY DAY IN THE MORNING (Patient taking differently: Take 2 mg by mouth daily. ) 180 tablet 4 11/15/2019 at Unknown time  . furosemide (LASIX) 40 MG tablet Take 1 tablet (40 mg total) by mouth 2 (two) times  daily. 60 tablet 11 11/15/2019 at Unknown time  . INCRUSE ELLIPTA 62.5 MCG/INH AEPB TAKE 1 PUFF BY MOUTH EVERY DAY 30 each 5 11/15/2019 at Unknown time  . omeprazole (PRILOSEC) 40 MG capsule Take 40 mg by mouth at bedtime.   2 11/15/2019 at Unknown time  . phenytoin (DILANTIN) 100 MG ER capsule Take 100-200 mg by mouth See admin instructions. Take one capsule in the morning and 2 capsules at bedtime   11/15/2019 at Unknown time  . Potassium Chloride ER 20 MEQ TBCR Take 20 mEq by mouth daily.    11/15/2019 at Unknown time  . Probiotic Product (PROBIOTIC DAILY) CAPS Take 1 capsule by mouth daily.   11/15/2019 at Unknown time  . Tocilizumab (ACTEMRA IV) Inject into the vein every 28 (twenty-eight) days. For Rheumatoid Arthritis   Past Month at Unknown time  . warfarin (COUMADIN) 3 MG tablet Take 3 mg by mouth at bedtime.    11/15/2019 at 0200  . leflunomide (ARAVA) 20 MG tablet TAKE 1 TABLET BY MOUTH EVERY DAY (Patient not taking: Reported on 11/16/2019) 90 tablet 0 Not Taking at Unknown time    Assessment: Pharmacy consulted to dose warfarin in patient with history of DVT.  INR on admission is subtherapeutic at 1.5. INR remains subtherapeutic at 1.6.   Home dose of warfarin listed as 3 mg daily with last dose given 11/16  INR now therapeutic Some hematochezia reported CBC stable  Goal of Therapy:  INR 2-3 Monitor platelets by anticoagulation protocol: Yes   Plan:  Warfarin 4 mg x 1 dose. Monitor daily INR and s/s of bleeding.  Ulice Dash, PharmD, BCPS Clinical Pharmacist  11/18/2019 9:46 AM

## 2019-11-19 LAB — COMPREHENSIVE METABOLIC PANEL
ALT: 71 U/L — ABNORMAL HIGH (ref 0–44)
AST: 92 U/L — ABNORMAL HIGH (ref 15–41)
Albumin: 3.5 g/dL (ref 3.5–5.0)
Alkaline Phosphatase: 61 U/L (ref 38–126)
Anion gap: 14 (ref 5–15)
BUN: 26 mg/dL — ABNORMAL HIGH (ref 8–23)
CO2: 26 mmol/L (ref 22–32)
Calcium: 8.6 mg/dL — ABNORMAL LOW (ref 8.9–10.3)
Chloride: 100 mmol/L (ref 98–111)
Creatinine, Ser: 1.28 mg/dL — ABNORMAL HIGH (ref 0.61–1.24)
GFR calc Af Amer: >60 mL/min (ref >60)
GFR calc non Af Amer: 57 mL/min — ABNORMAL LOW (ref >60)
Glucose, Bld: 198 mg/dL — ABNORMAL HIGH (ref 70–99)
Potassium: 3.8 mmol/L (ref 3.5–5.1)
Sodium: 140 mmol/L (ref 135–145)
Total Bilirubin: 0.5 mg/dL (ref 0.3–1.2)
Total Protein: 6.3 g/dL — ABNORMAL LOW (ref 6.5–8.1)

## 2019-11-19 LAB — CBC
HCT: 44.5 % (ref 39.0–52.0)
Hemoglobin: 14.7 g/dL (ref 13.0–17.0)
MCH: 30.3 pg (ref 26.0–34.0)
MCHC: 33 g/dL (ref 30.0–36.0)
MCV: 91.8 fL (ref 80.0–100.0)
Platelets: 127 10*3/uL — ABNORMAL LOW (ref 150–400)
RBC: 4.85 MIL/uL (ref 4.22–5.81)
RDW: 14.7 % (ref 11.5–15.5)
WBC: 6 10*3/uL (ref 4.0–10.5)
nRBC: 0 % (ref 0.0–0.2)

## 2019-11-19 LAB — ABO/RH: ABO/RH(D): B POS

## 2019-11-19 LAB — LEGIONELLA PNEUMOPHILA SEROGP 1 UR AG: L. pneumophila Serogp 1 Ur Ag: NEGATIVE

## 2019-11-19 LAB — PROTIME-INR
INR: 2.3 — ABNORMAL HIGH (ref 0.8–1.2)
Prothrombin Time: 25 seconds — ABNORMAL HIGH (ref 11.4–15.2)

## 2019-11-19 LAB — C-REACTIVE PROTEIN: CRP: 0.9 mg/dL (ref <1.0)

## 2019-11-19 LAB — D-DIMER, QUANTITATIVE: D-Dimer, Quant: 0.4 ug/mL-FEU (ref 0.00–0.50)

## 2019-11-19 MED ORDER — SODIUM CHLORIDE 0.9% IV SOLUTION: Freq: Once | INTRAVENOUS | Status: DC

## 2019-11-19 MED ORDER — WARFARIN SODIUM 3 MG PO TABS
3.0000 mg | ORAL_TABLET | Freq: Once | ORAL | Status: AC
  Administered 2019-11-19: 3 mg via ORAL
  Filled 2019-11-19: qty 1

## 2019-11-19 MED ORDER — FAMOTIDINE 20 MG PO TABS
20.0000 mg | ORAL_TABLET | Freq: Two times a day (BID) | ORAL | Status: DC
  Administered 2019-11-19 – 2019-11-23 (×8): 20 mg via ORAL
  Filled 2019-11-19 (×8): qty 1

## 2019-11-19 NOTE — Progress Notes (Signed)
ANTICOAGULATION CONSULT NOTE -   Pharmacy Consult for Warfarin Indication: history of DVT  Allergies  Allergen Reactions  . Orencia [Abatacept] Anaphylaxis    Had prostate cancer  . Carbamazepine Rash    REACTION: makes drowsy BRAND NAME IS TEGRETOL  . Celecoxib Itching and Rash    BRAND NAME IS CELEBREX  . Cephalexin Rash    "Severe Rash".  BRAND NAME IS KEFLEX.   . Enbrel [Etanercept] Rash  . Humira [Adalimumab] Rash  . Levofloxacin Other (See Comments)    REACTION: GI Intolerance BRAND NAME IS LEVAQUIN  . Sulfa Antibiotics Rash  . Sulfasalazine Rash    Patient Measurements: Height: 5\' 8"  (172.7 cm) Weight: 216 lb 12.8 oz (98.3 kg) IBW/kg (Calculated) : 68.4   Vital Signs: Temp: 98.3 F (36.8 C) (11/20 0337) Temp Source: Oral (11/20 0337) BP: 152/92 (11/20 0337) Pulse Rate: 86 (11/20 0337)  Labs: Recent Labs    11/16/19 0915  11/16/19 1244 11/17/19 0535 11/18/19 0322 11/19/19 0042  HGB 15.4  --   --  15.0 14.4 14.7  HCT 48.1  --   --  46.1 43.7 44.5  PLT 108*  --   --  120* 122* 127*  LABPROT  --    < > 17.6* 19.2* 22.7* 25.0*  INR  --    < > 1.5* 1.6* 2.0* 2.3*  CREATININE 1.38*  --   --  1.43* 1.28* 1.28*  TROPONINIHS 17  --  17  --   --   --    < > = values in this interval not displayed.    Estimated Creatinine Clearance: 61.9 mL/min (A) (by C-G formula based on SCr of 1.28 mg/dL (H)).   Medical History: Past Medical History:  Diagnosis Date  . Anxiety    hx of   . Arthritis    RA  . Asthma   . CHF (congestive heart failure) ([place])   . Clotting disorder (HCC)    DVT both legs   . COPD (chronic obstructive pulmonary disease) ([place])   . DDD (degenerative disc disease), cervical    with UE's paresthesias  . Diverticulosis   . DVT, lower extremity ([place])    bilat  . 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 GERD (gastroesophageal reflux disease)   . Gout   . Heart murmur   .  History of measles   . History of shingles   . Hypercholesterolemia   . Hypertension   . IBS (irritable bowel syndrome)   . IBS (irritable bowel syndrome)   . Peripheral edema   . Pneumonia    hx of   . Prostate cancer ([place])   . Seizures ([place])    last seizure 20 years ago; on dilantin. Unknown origin.  [name] Shortness of breath dyspnea    exertion   . Sleep apnea    not on cpap  . Tubular adenoma of colon 04/2008    Medications:  Medications Prior to Admission  Medication Sig Dispense Refill Last Dose  . acetaminophen (TYLENOL) 500 MG tablet Take 1,000 mg by mouth every 6 (six) hours as needed (arthritis pain).     [name] albuterol (PROVENTIL) (2.5 MG/3ML) 0.083% nebulizer solution Take 3 mLs (2.5 mg total) by nebulization every 6 (six) hours as needed for wheezing or shortness of breath. 75 mL 12 11/16/2019 at Unknown time  . alendronate (FOSAMAX) 70 MG tablet TAKE 1 TABLET BY MOUTH  ONCE A WEEK. TAKE WITH FULL GLASS OF WATER ON EMPTY STOMACH 12 tablet 0 Past Week at Unknown time  . allopurinol (ZYLOPRIM) 100 MG tablet TAKE 1 TABLET BY MOUTH TWICE A DAY 180 tablet 0 11/15/2019 at Unknown time  . BREO ELLIPTA 100-25 MCG/INH AEPB USE 1 INHALATION ORALLY    DAILY 180 each 1 11/15/2019 at Unknown time  . Calcium-Phosphorus-Vitamin D (CITRACAL +D3 PO) Take 2 tablets by mouth 2 (two) times daily.    11/15/2019 at Unknown time  . diclofenac sodium (VOLTAREN) 1 % GEL Apply 3 grams to 3 large joints up to 3 times daily (Patient taking differently: Apply 3 g topically 3 (three) times daily as needed (pain). Apply 3 grams to 3 large joints up to 3 times daily) 3 Tube 3   . dicyclomine (BENTYL) 10 MG capsule TAKE 1 CAPSULE BY MOUTH 4 TIMES DAILY BEFORE MEALS AND AT BEDTIME (Patient taking differently: Take 20 mg by mouth 2 (two) times daily. ) 120 capsule 0 11/15/2019 at Unknown time  . folic acid (FOLVITE) 1 MG tablet TAKE 2 TABLETS BY MOUTH EVERY DAY IN THE MORNING (Patient taking differently: Take 2 mg by  mouth daily. ) 180 tablet 4 11/15/2019 at Unknown time  . furosemide (LASIX) 40 MG tablet Take 1 tablet (40 mg total) by mouth 2 (two) times daily. 60 tablet 11 11/15/2019 at Unknown time  . INCRUSE ELLIPTA 62.5 MCG/INH AEPB TAKE 1 PUFF BY MOUTH EVERY DAY 30 each 5 11/15/2019 at Unknown time  . omeprazole (PRILOSEC) 40 MG capsule Take 40 mg by mouth at bedtime.   2 11/15/2019 at Unknown time  . phenytoin (DILANTIN) 100 MG ER capsule Take 100-200 mg by mouth See admin instructions. Take one capsule in the morning and 2 capsules at bedtime   11/15/2019 at Unknown time  . Potassium Chloride ER 20 MEQ TBCR Take 20 mEq by mouth daily.    11/15/2019 at Unknown time  . Probiotic Product (PROBIOTIC DAILY) CAPS Take 1 capsule by mouth daily.   11/15/2019 at Unknown time  . Tocilizumab (ACTEMRA IV) Inject into the vein every 28 (twenty-eight) days. For Rheumatoid Arthritis   Past Month at Unknown time  . warfarin (COUMADIN) 3 MG tablet Take 3 mg by mouth at bedtime.    11/15/2019 at 0200  . leflunomide (ARAVA) 20 MG tablet TAKE 1 TABLET BY MOUTH EVERY DAY (Patient not taking: Reported on 11/16/2019) 90 tablet 0 Not Taking at Unknown time    Assessment: Pharmacy consulted to dose warfarin in patient with history of DVT.  INR on admission is subtherapeutic at 1.5.   Home dose of warfarin listed as 3 mg daily with last dose given 11/16  INR remains therapeutic at 2.3 after receiving 3 doses of 4 mg. Hgb 14.7, plt 127. Hematochezia improving. Did receive 1 dose of levofloxacin on 11/17.   Goal of Therapy:  INR 2-3 Monitor platelets by anticoagulation protocol: Yes   Plan:  Warfarin 3 mg x 1 dose as per PTA regimen Monitor daily INR and s/s of bleeding.  Antonietta Jewel, PharmD, [place] Clinical Pharmacist  Phone: [phone]  11/19/2019 8:23 AM

## 2019-11-19 NOTE — Progress Notes (Signed)
PROGRESS NOTE  [name] [id number] DOB: [birth year] DOA: 11/16/2019 PCP: [name], MD  Brief History:   68 year old male with a history of remote arthritis, COPD, lower extremity DVTs, seizure disorder, prostate cancer presenting with 1 day history of shortness of breath, nonproductive cough, and generalized weakness.  The patient noted some wheezing that began on on 11/16/2019.  He states that he has chronic lower extremity edema which is about the same as usual.  In the emergency department, the patient was noted to have oxygen saturation of 88% room air.  Chest x-ray showed increased interstitial markings and right upper lobe opacity.  He was placed on 2 L nasal cannula with improvement of her oxygen saturation and 94-95%.  The patient was initially started on levofloxacin for presumptive community-acquired pneumonia prior to his Covid test coming back positive.  In addition, the patient states that he has had hematochezia for 1 day.  He has spoken with his gastroenterologist, Dr. Fuller Plan in the past.  He states that this is typical of his hemorrhoidal bleeding in the past that has been waxing and waning every 3 to 4 weeks.  He states that he was told to use Anusol suppositories in the past when this occurs.  Subjective:  Patient reports dyspnea still present, but is improving, mainly exertional, reports cough, denies any chest pain, nausea or vomiting.  No further hematochezia since admission  Assessment/Plan:  Acute respiratory failure secondary to Covid 19 pneumonia -He remains on 2 L nasal cannula, he is getting more hypoxic while sleeping (she is more likely in the setting of OSA ). -Continue with IV remdesivir -Continue with IV Solu-Medrol. -Continue to trend inflammatory markers, even though inflammatory markers are normal, best he is chronically on Actemra for rheumatoid arthritis, most recent 10/24, which might be artificially lower in the setting of Actemra  use , when patient standing hypoxia overnight, I have discussed with him convulsant plasma, agreeable, will proceed with plasma transfusion today .. -Procalcitonin within normal limit, levofloxacin has been stopped  COVID-19 Labs  Recent Labs    11/17/19 0535 11/17/19 0636 11/18/19 0322 11/19/19 0042  DDIMER  --   --  0.44 0.40  FERRITIN  --  396*  --   --   LDH 310*  --   --   --   CRP  --  1.0* <0.8 0.9    Lab Results  Component Value Date   SARSCOV2NAA POSITIVE (A) 11/16/2019   [place] NEGATIVE 06/07/2019   [place] NEGATIVE 06/02/2019     COPD exacerbation -There is no wheezing today, continue with IV Solu-Medrol -Continue Combivent -Continue Breo  Hematochezia -Likely secondary to hemorrhoidal bleed -Start Anusol suppositories -07/17/2016 colonoscopy--mild diverticulosis, grade 1 internal hemorrhoids -Hemoglobin has been stable  CKD stage 3 -baseline creatinine 1.1-1.4  Lower extremity DVT history -Patient endorses compliance with warfarin -Continue warfarin for now monitoring his hematochezia closely  Rheumatoid arthritis -holding arava  -pt did not recall dated of last Actemra infusion  Seizure disorder -Continue Dilantin  GERD -Continue PPI       Disposition Plan:   Home once stable  Family Communication:   Discussed with patient  Consultants:  none  Code Status:  FULL  DVT Prophylaxis:  warfarin   Procedures: As Listed in Progress Note Above  Antibiotics: None       Objective: Vitals:   11/18/19 1940 11/19/19 0337 11/19/19 0400 11/19/19 0800  BP: (!) 157/88 (!) 152/92  [name])  141/81  Pulse: 86 86    Resp: 19 19  15   Temp: 98.3 F (36.8 C) 98.3 F (36.8 C)  97.8 F (36.6 C)  TempSrc: Oral Oral  Oral  SpO2: 93% 93%  96%  Weight:   98.3 kg   Height:        Intake/Output Summary (Last 24 hours) at 11/19/2019 1541 Last data filed at 11/19/2019 1300 Gross per 24 hour  Intake 660 ml  Output 675 ml  Net -15 ml    Weight change: -0.36 kg Exam:   Awake Alert, Oriented X 3, No new F.N deficits, Normal affect Symmetrical Chest wall movement, Good air movement bilaterally, CTAB RRR,No Gallops,Rubs or new Murmurs, No Parasternal Heave +ve B.Sounds, Abd Soft, No tenderness, No rebound - guarding or rigidity. No Cyanosis, Clubbing or edema, No new Rash or bruise     Data Reviewed: I have personally reviewed following labs and imaging studies Basic Metabolic Panel: Recent Labs  Lab 11/16/19 0915 11/17/19 0535 11/18/19 0322 11/18/19 2230 11/19/19 0042  NA 142 141 139  --  140  K 3.0* 3.6 3.9  --  3.8  CL 100 101 101  --  100  CO2 28 27 26   --  26  GLUCOSE 125* 146* 144*  --  198*  BUN 12 20 25*  --  26*  CREATININE 1.38* 1.43* 1.28*  --  1.28*  CALCIUM 8.7* 8.6* 8.5*  --  8.6*  MG  --   --   --  2.0  --    Liver Function Tests: Recent Labs  Lab 11/16/19 0915 11/18/19 0322 11/19/19 0042  AST 64* 69* 92*  ALT 44 41 71*  ALKPHOS 66 52 61  BILITOT 1.0 0.8 0.5  PROT 7.1 6.2* 6.3*  ALBUMIN 4.1 3.6 3.5   No results for input(s): LIPASE, AMYLASE in the last 168 hours. No results for input(s): AMMONIA in the last 168 hours. Coagulation Profile: Recent Labs  Lab 11/16/19 1244 11/17/19 0535 11/18/19 0322 11/19/19 0042  INR 1.5* 1.6* 2.0* 2.3*   CBC: Recent Labs  Lab 11/16/19 0915 11/17/19 0535 11/18/19 0322 11/19/19 0042  WBC 4.6 3.8* 5.7 6.0  NEUTROABS 2.8  --   --   --   HGB 15.4 15.0 14.4 14.7  HCT 48.1 46.1 43.7 44.5  MCV 91.3 91.3 91.0 91.8  PLT 108* 120* 122* 127*   Cardiac Enzymes: No results for input(s): CKTOTAL, CKMB, CKMBINDEX, TROPONINI in the last 168 hours. BNP: Invalid input(s): POCBNP CBG: No results for input(s): GLUCAP in the last 168 hours. HbA1C: No results for input(s): HGBA1C in the last 72 hours. Urine analysis:    Component Value Date/Time   COLORURINE YELLOW 11/29/2018 1033   APPEARANCEUR HAZY (A) 11/29/2018 1033   LABSPEC 1.025 11/29/2018  1033   PHURINE 5.0 11/29/2018 1033   GLUCOSEU NEGATIVE 11/29/2018 1033   HGBUR MODERATE (A) 11/29/2018 1033   BILIRUBINUR NEGATIVE 11/29/2018 1033   KETONESUR NEGATIVE 11/29/2018 1033   PROTEINUR 30 (A) 11/29/2018 1033   UROBILINOGEN 0.2 09/24/2012 1640   NITRITE NEGATIVE 11/29/2018 1033   LEUKOCYTESUR NEGATIVE 11/29/2018 1033   Sepsis Labs: @LABRCNTIP (procalcitonin:4,lacticidven:4) ) Recent Results (from the past 240 hour(s))  SARS CORONAVIRUS 2 (TAT 6-24 HRS) Nasopharyngeal Nasopharyngeal Swab     Status: Abnormal   Collection Time: 11/16/19  9:09 AM   Specimen: Nasopharyngeal Swab  Result Value Ref Range Status   SARS Coronavirus 2 POSITIVE (A) NEGATIVE Final    Comment: RESULT CALLED  TO, READ BACK BY AND VERIFIED WITH: B JOHNSON,RN 2019 11/16/2019 D BRADLEY (NOTE) SARS-CoV-2 target nucleic acids are DETECTED. The SARS-CoV-2 RNA is generally detectable in upper and lower respiratory specimens during the acute phase of infection. Positive results are indicative of active infection with SARS-CoV-2. Clinical  correlation with patient history and other diagnostic information is necessary to determine patient infection status. Positive results do  not rule out bacterial infection or co-infection with other viruses. The expected result is Negative. Fact Sheet for Patients: SugarRoll.be Fact Sheet for Healthcare Providers: https://www.woods-mathews.com/ This test is not yet approved or cleared by the [place] FDA and  has been authorized for detection and/or diagnosis of SARS-CoV-2 by FDA under an Emergency Use Authorization (EUA). This EUA will remain  in effect (meaning this test can be used) fo r the duration of the COVID-19 declaration under Section 564(b)(1) of the Act, 21 U.S.C. section 360bbb-3(b)(1), unless the authorization is terminated or revoked sooner. Performed at [facility], [place] [address] [place], [place]  [address]   Blood Culture (routine x 2)     Status: None (Preliminary result)   Collection Time: 11/16/19 10:01 AM   Specimen: BLOOD RIGHT HAND  Result Value Ref Range Status   Specimen Description BLOOD RIGHT HAND  Final   Special Requests   Final    BOTTLES DRAWN AEROBIC AND ANAEROBIC Blood Culture results may not be optimal due to an inadequate volume of blood received in culture bottles   Culture   Final    NO GROWTH 3 DAYS Performed at [facility], [address], [place], [place] [address]    Report Status PENDING  Incomplete  Blood Culture (routine x 2)     Status: None (Preliminary result)   Collection Time: 11/16/19 10:02 AM   Specimen: BLOOD LEFT ARM  Result Value Ref Range Status   Specimen Description BLOOD LEFT ARM  Final   Special Requests   Final    BOTTLES DRAWN AEROBIC AND ANAEROBIC Blood Culture results may not be optimal due to an inadequate volume of blood received in culture bottles   Culture   Final    NO GROWTH 3 DAYS Performed at [facility], [address] [place], [place] [address]    Report Status PENDING  Incomplete     Scheduled Meds: . sodium chloride   Intravenous Once  . acidophilus  1 capsule Oral Daily  . allopurinol  100 mg Oral BID  . calcium-vitamin D  2 tablet Oral QHS  . dicyclomine  20 mg Oral BID  . fluticasone furoate-vilanterol  1 puff Inhalation Daily  . folic acid  2 mg Oral Daily  . furosemide  40 mg Oral Daily  . hydrocortisone   Rectal BID  . Ipratropium-Albuterol  1 puff Inhalation Q6H  . methylPREDNISolone (SOLU-MEDROL) injection  60 mg Intravenous Q12H  . pantoprazole  40 mg Oral Daily  . phenytoin  100 mg Oral q morning - 10a  . phenytoin  200 mg Oral QHS  . potassium chloride SA  20 mEq Oral Daily  . warfarin  3 mg Oral ONCE-1800  . Warfarin - Pharmacist Dosing Inpatient   Does not apply q1800   Continuous Infusions: . sodium chloride Stopped (11/16/19 1416)  . remdesivir 100 mg in NS 250 mL 100 mg (11/19/19 1000)     Procedures/Studies: Dg Chest Port 1 View  Result Date: 11/16/2019 CLINICAL DATA:  Shortness of breath, cold symptoms for a week, productive cough, fatigue,  intermittent chest pain, decreased oxygen saturation, worsening shortness of breath this morning, history prostate cancer, former smoker, asthma EXAM: PORTABLE CHEST 1 VIEW COMPARISON:  Portable exam 0929 hours compared to 06/07/2019 FINDINGS: Chronic elevation LEFT diaphragm. Upper normal heart size with slight vascular congestion. Mediastinal contours normal. Atherosclerotic calcification aorta. LEFT basilar atelectasis. Mild infiltrate RIGHT upper lobe question pneumonia. No pleural effusion or pneumothorax. IMPRESSION: Suspected RIGHT upper lobe pneumonia. Chronic elevation of LEFT diaphragm with LEFT basilar atelectasis. Electronically Signed   By: Lavonia Dana M.D.   On: 11/16/2019 09:55    Phillips Climes, MD  [facility]   If 7PM-7AM, please contact night-coverage [email or web address] Password [email or web address] 11/19/2019, 3:41 PM   LOS: 3 days

## 2019-11-19 NOTE — Plan of Care (Signed)
  Problem: Clinical Measurements: Goal: Respiratory complications will improve Outcome: Progressing   Problem: Clinical Measurements: Goal: Ability to maintain clinical measurements within normal limits will improve Outcome: Progressing   

## 2019-11-20 DIAGNOSIS — J1289 Other viral pneumonia: Secondary | ICD-10-CM

## 2019-11-20 LAB — BPAM FFP
Blood Product Expiration Date: 202011211814
ISSUE DATE / TIME: 202011202024
Unit Type and Rh: 7300

## 2019-11-20 LAB — COMPREHENSIVE METABOLIC PANEL
ALT: 69 U/L — ABNORMAL HIGH (ref 0–44)
AST: 75 U/L — ABNORMAL HIGH (ref 15–41)
Albumin: 3.6 g/dL (ref 3.5–5.0)
Alkaline Phosphatase: 56 U/L (ref 38–126)
Anion gap: 10 (ref 5–15)
BUN: 28 mg/dL — ABNORMAL HIGH (ref 8–23)
CO2: 30 mmol/L (ref 22–32)
Calcium: 8.7 mg/dL — ABNORMAL LOW (ref 8.9–10.3)
Chloride: 101 mmol/L (ref 98–111)
Creatinine, Ser: 1.11 mg/dL (ref 0.61–1.24)
GFR calc Af Amer: >60 mL/min (ref >60)
GFR calc non Af Amer: >60 mL/min (ref >60)
Glucose, Bld: 138 mg/dL — ABNORMAL HIGH (ref 70–99)
Potassium: 4.2 mmol/L (ref 3.5–5.1)
Sodium: 141 mmol/L (ref 135–145)
Total Bilirubin: 0.9 mg/dL (ref 0.3–1.2)
Total Protein: 6.2 g/dL — ABNORMAL LOW (ref 6.5–8.1)

## 2019-11-20 LAB — PREPARE FRESH FROZEN PLASMA

## 2019-11-20 LAB — CBC
HCT: 43.8 % (ref 39.0–52.0)
Hemoglobin: 14.5 g/dL (ref 13.0–17.0)
MCH: 30.3 pg (ref 26.0–34.0)
MCHC: 33.1 g/dL (ref 30.0–36.0)
MCV: 91.4 fL (ref 80.0–100.0)
Platelets: 149 10*3/uL — ABNORMAL LOW (ref 150–400)
RBC: 4.79 MIL/uL (ref 4.22–5.81)
RDW: 14.5 % (ref 11.5–15.5)
WBC: 7 10*3/uL (ref 4.0–10.5)
nRBC: 0 % (ref 0.0–0.2)

## 2019-11-20 LAB — D-DIMER, QUANTITATIVE: D-Dimer, Quant: 0.33 ug/mL-FEU (ref 0.00–0.50)

## 2019-11-20 LAB — PROTIME-INR
INR: 2.2 — ABNORMAL HIGH (ref 0.8–1.2)
Prothrombin Time: 24.3 seconds — ABNORMAL HIGH (ref 11.4–15.2)

## 2019-11-20 LAB — C-REACTIVE PROTEIN: CRP: <0.8 mg/dL (ref <1.0)

## 2019-11-20 MED ORDER — METHYLPREDNISOLONE SODIUM SUCC 40 MG IJ SOLR: 40.0000 mg | Freq: Every day | INTRAMUSCULAR | Status: DC

## 2019-11-20 MED ORDER — WARFARIN SODIUM 3 MG PO TABS
3.0000 mg | ORAL_TABLET | Freq: Once | ORAL | Status: AC
  Administered 2019-11-20: 3 mg via ORAL
  Filled 2019-11-20: qty 1

## 2019-11-20 MED ORDER — METHYLPREDNISOLONE SODIUM SUCC 40 MG IJ SOLR
40.0000 mg | Freq: Three times a day (TID) | INTRAMUSCULAR | Status: DC
  Administered 2019-11-20 – 2019-11-21 (×5): 40 mg via INTRAVENOUS
  Filled 2019-11-20 (×6): qty 1

## 2019-11-20 NOTE — Plan of Care (Signed)
  Problem: Clinical Measurements: Goal: Will remain free from infection Outcome: Progressing   Problem: Clinical Measurements: Goal: Respiratory complications will improve Outcome: Progressing   Problem: Safety: Goal: Ability to remain free from injury will improve Outcome: Progressing   

## 2019-11-20 NOTE — Progress Notes (Signed)
PROGRESS NOTE  [name] [id number] DOB: 20-May-1951 DOA: 11/16/2019 PCP: [name], MD  Brief History:   68 year old male with a history of remote arthritis, COPD, lower extremity DVTs, seizure disorder, prostate cancer presenting with 1 day history of shortness of breath, nonproductive cough, and generalized weakness.  The patient noted some wheezing that began on on 11/16/2019.  He states that he has chronic lower extremity edema which is about the same as usual.  In the emergency department, the patient was noted to have oxygen saturation of 88% room air.  Chest x-ray showed increased interstitial markings and right upper lobe opacity.  He was placed on 2 L nasal cannula with improvement of her oxygen saturation and 94-95%.  The patient was initially started on levofloxacin for presumptive community-acquired pneumonia prior to his Covid test coming back positive.  In addition, the patient states that he has had hematochezia for 1 day.  He has spoken with his gastroenterologist, Dr. Fuller Plan in the past.  He states that this is typical of his hemorrhoidal bleeding in the past that has been waxing and waning every 3 to 4 weeks.  He states that he was told to use Anusol suppositories in the past when this occurs.  Subjective:  Patient reports overall he is feeling better, but still having dyspnea, reports cough, currently productive with yellow phlegm, his nausea or vomiting, no further hematochezia .  Assessment/Plan:  Acute respiratory failure secondary to Covid 19 pneumonia -He remains on 2 L nasal cannula, he is getting more hypoxic while sleeping (she is more likely in the setting of OSA ). -Continue with IV remdesivir, will finish last dose today -Continue with IV Solu-Medrol.,  Being tapered, patient with diffuse wheezing today most likely in the setting of COPD exacerbation, this has been increased. -Received convulsant plasma 11/20 -Continue to trend inflammatory  markers, even though inflammatory markers are normal, best he is chronically on Actemra for rheumatoid arthritis, most recent 10/24, which might be artificially lower in the setting of Actemra use .. -Procalcitonin within normal limit, levofloxacin has been stopped  COVID-19 Labs  Recent Labs    11/18/19 0322 11/19/19 0042 11/20/19 0050  DDIMER 0.44 0.40 0.33  CRP <0.8 0.9 <0.8    Lab Results  Component Value Date   SARSCOV2NAA POSITIVE (A) 11/16/2019   [place] NEGATIVE 06/07/2019   [place] NEGATIVE 06/02/2019     COPD exacerbation -Patient with worsening wheezing today, I will increase his IV Solu-Medrol -Continue with Combivent and Breo  Transaminitis -In the setting of COVID-19, continue to monitor  Hematochezia -Likely secondary to hemorrhoidal bleed -Start Anusol suppositories -07/17/2016 colonoscopy--mild diverticulosis, grade 1 internal hemorrhoids -Hemoglobin has been stable  CKD stage 3 -baseline creatinine 1.1-1.4  Lower extremity DVT history -Patient endorses compliance with warfarin -Continue warfarin for now monitoring his hematochezia closely  Rheumatoid arthritis -holding arava  -pt did not recall dated of last Actemra infusion  Seizure disorder -Continue Dilantin  GERD -Continue PPI       Disposition Plan:   Home once stable  Family Communication:   Discussed with patient  Consultants:  none  Code Status:  FULL  DVT Prophylaxis:  warfarin   Procedures: As Listed in Progress Note Above  Antibiotics: None       Objective: Vitals:   11/19/19 2252 11/20/19 0449 11/20/19 0450 11/20/19 0900  BP: (!) 152/93  136/66 (!) 150/95  Pulse: 76  79   Resp: (!)  21  (!) 21 (!) 21  Temp: 99.1 F (37.3 C)  98.5 F (36.9 C) 98.3 F (36.8 C)  TempSrc: Oral  Oral Oral  SpO2: 97%  97%   Weight:  98.7 kg    Height:        Intake/Output Summary (Last 24 hours) at 11/20/2019 1300 Last data filed at 11/20/2019 0449 Gross per  24 hour  Intake 1073 ml  Output 2100 ml  Net -1027 ml   Weight change: 0.408 kg Exam:   Awake Alert, Oriented X 3, No new F.N deficits, Normal affect Symmetrical Chest wall movement, entry bilaterally today with significant diffuse wheezing RRR,No Gallops,Rubs or new Murmurs, No Parasternal Heave +ve B.Sounds, Abd Soft, No tenderness, No rebound - guarding or rigidity. No Cyanosis, Clubbing or edema, No new Rash or bruise      Data Reviewed: I have personally reviewed following labs and imaging studies Basic Metabolic Panel: Recent Labs  Lab 11/16/19 0915 11/17/19 0535 11/18/19 0322 11/18/19 2230 11/19/19 0042 11/20/19 0050  NA 142 141 139  --  140 141  K 3.0* 3.6 3.9  --  3.8 4.2  CL 100 101 101  --  100 101  CO2 28 27 26   --  26 30  GLUCOSE 125* 146* 144*  --  198* 138*  BUN 12 20 25*  --  26* 28*  CREATININE 1.38* 1.43* 1.28*  --  1.28* 1.11  CALCIUM 8.7* 8.6* 8.5*  --  8.6* 8.7*  MG  --   --   --  2.0  --   --    Liver Function Tests: Recent Labs  Lab 11/16/19 0915 11/18/19 0322 11/19/19 0042 11/20/19 0050  AST 64* 69* 92* 75*  ALT 44 41 71* 69*  ALKPHOS 66 52 61 56  BILITOT 1.0 0.8 0.5 0.9  PROT 7.1 6.2* 6.3* 6.2*  ALBUMIN 4.1 3.6 3.5 3.6   No results for input(s): LIPASE, AMYLASE in the last 168 hours. No results for input(s): AMMONIA in the last 168 hours. Coagulation Profile: Recent Labs  Lab 11/16/19 1244 11/17/19 0535 11/18/19 0322 11/19/19 0042 11/20/19 0050  INR 1.5* 1.6* 2.0* 2.3* 2.2*   CBC: Recent Labs  Lab 11/16/19 0915 11/17/19 0535 11/18/19 0322 11/19/19 0042 11/20/19 0050  WBC 4.6 3.8* 5.7 6.0 7.0  NEUTROABS 2.8  --   --   --   --   HGB 15.4 15.0 14.4 14.7 14.5  HCT 48.1 46.1 43.7 44.5 43.8  MCV 91.3 91.3 91.0 91.8 91.4  PLT 108* 120* 122* 127* 149*   Cardiac Enzymes: No results for input(s): CKTOTAL, CKMB, CKMBINDEX, TROPONINI in the last 168 hours. BNP: Invalid input(s): POCBNP CBG: No results for input(s):  GLUCAP in the last 168 hours. HbA1C: No results for input(s): HGBA1C in the last 72 hours. Urine analysis:    Component Value Date/Time   COLORURINE YELLOW 11/29/2018 1033   APPEARANCEUR HAZY (A) 11/29/2018 1033   LABSPEC 1.025 11/29/2018 1033   PHURINE 5.0 11/29/2018 1033   GLUCOSEU NEGATIVE 11/29/2018 1033   HGBUR MODERATE (A) 11/29/2018 1033   BILIRUBINUR NEGATIVE 11/29/2018 1033   KETONESUR NEGATIVE 11/29/2018 1033   PROTEINUR 30 (A) 11/29/2018 1033   UROBILINOGEN 0.2 09/24/2012 1640   NITRITE NEGATIVE 11/29/2018 1033   LEUKOCYTESUR NEGATIVE 11/29/2018 1033   Sepsis Labs: @LABRCNTIP (procalcitonin:4,lacticidven:4) ) Recent Results (from the past 240 hour(s))  SARS CORONAVIRUS 2 (TAT 6-24 HRS) Nasopharyngeal Nasopharyngeal Swab     Status: Abnormal   Collection Time:  11/16/19  9:09 AM   Specimen: Nasopharyngeal Swab  Result Value Ref Range Status   SARS Coronavirus 2 POSITIVE (A) NEGATIVE Final    Comment: RESULT CALLED TO, READ BACK BY AND VERIFIED WITH: B JOHNSON,RN 2019 11/16/2019 D BRADLEY (NOTE) SARS-CoV-2 target nucleic acids are DETECTED. The SARS-CoV-2 RNA is generally detectable in upper and lower respiratory specimens during the acute phase of infection. Positive results are indicative of active infection with SARS-CoV-2. Clinical  correlation with patient history and other diagnostic information is necessary to determine patient infection status. Positive results do  not rule out bacterial infection or co-infection with other viruses. The expected result is Negative. Fact Sheet for Patients: SugarRoll.be Fact Sheet for Healthcare Providers: https://www.woods-mathews.com/ This test is not yet approved or cleared by the [place] FDA and  has been authorized for detection and/or diagnosis of SARS-CoV-2 by FDA under an Emergency Use Authorization (EUA). This EUA will remain  in effect (meaning this test can be used)  fo r the duration of the COVID-19 declaration under Section 564(b)(1) of the Act, 21 U.S.C. section 360bbb-3(b)(1), unless the authorization is terminated or revoked sooner. Performed at [facility], [place] [address], [place], [place] [address]   Blood Culture (routine x 2)     Status: None (Preliminary result)   Collection Time: 11/16/19 10:01 AM   Specimen: BLOOD RIGHT HAND  Result Value Ref Range Status   Specimen Description BLOOD RIGHT HAND  Final   Special Requests   Final    BOTTLES DRAWN AEROBIC AND ANAEROBIC Blood Culture results may not be optimal due to an inadequate volume of blood received in culture bottles   Culture   Final    NO GROWTH 4 DAYS Performed at [facility], [address] [place], [place] [address]    Report Status PENDING  Incomplete  Blood Culture (routine x 2)     Status: None (Preliminary result)   Collection Time: 11/16/19 10:02 AM   Specimen: BLOOD LEFT ARM  Result Value Ref Range Status   Specimen Description BLOOD LEFT ARM  Final   Special Requests   Final    BOTTLES DRAWN AEROBIC AND ANAEROBIC Blood Culture results may not be optimal due to an inadequate volume of blood received in culture bottles   Culture   Final    NO GROWTH 4 DAYS Performed at [facility], [address] [place], [place] [address]    Report Status PENDING  Incomplete     Scheduled Meds: . sodium chloride   Intravenous Once  . acidophilus  1 capsule Oral Daily  . allopurinol  100 mg Oral BID  . calcium-vitamin D  2 tablet Oral QHS  . dicyclomine  20 mg Oral BID  . famotidine  20 mg Oral BID  . fluticasone furoate-vilanterol  1 puff Inhalation Daily  . folic acid  2 mg Oral Daily  . furosemide  40 mg Oral Daily  . hydrocortisone   Rectal BID  . Ipratropium-Albuterol  1 puff Inhalation Q6H  . [START ON 11/21/2019] methylPREDNISolone (SOLU-MEDROL) injection  40 mg Intravenous Daily  . pantoprazole  40 mg Oral Daily  . phenytoin  100 mg Oral q morning - 10a  .  phenytoin  200 mg Oral QHS  . potassium chloride SA  20 mEq Oral Daily  . warfarin  3 mg Oral ONCE-1800  . Warfarin - Pharmacist Dosing Inpatient   Does not apply q1800   Continuous Infusions: . sodium chloride Stopped (11/16/19  1416)    Procedures/Studies: Dg Chest Port 1 View  Result Date: 11/16/2019 CLINICAL DATA:  Shortness of breath, cold symptoms for a week, productive cough, fatigue, intermittent chest pain, decreased oxygen saturation, worsening shortness of breath this morning, history prostate cancer, former smoker, asthma EXAM: PORTABLE CHEST 1 VIEW COMPARISON:  Portable exam 0929 hours compared to 06/07/2019 FINDINGS: Chronic elevation LEFT diaphragm. Upper normal heart size with slight vascular congestion. Mediastinal contours normal. Atherosclerotic calcification aorta. LEFT basilar atelectasis. Mild infiltrate RIGHT upper lobe question pneumonia. No pleural effusion or pneumothorax. IMPRESSION: Suspected RIGHT upper lobe pneumonia. Chronic elevation of LEFT diaphragm with LEFT basilar atelectasis. Electronically Signed   By: Lavonia Dana M.D.   On: 11/16/2019 09:55    Phillips Climes, MD  [facility]   If 7PM-7AM, please contact night-coverage [email or web address] Password [email or web address] 11/20/2019, 1:00 PM   LOS: 4 days

## 2019-11-20 NOTE — Progress Notes (Signed)
ANTICOAGULATION CONSULT NOTE - Follow Up Consult  Pharmacy Consult for Warfarin  Indication: history of DVT  Allergies  Allergen Reactions  . Orencia [Abatacept] Anaphylaxis    Had prostate cancer  . Carbamazepine Rash    REACTION: makes drowsy BRAND NAME IS TEGRETOL  . Celecoxib Itching and Rash    BRAND NAME IS CELEBREX  . Cephalexin Rash    "Severe Rash".  BRAND NAME IS KEFLEX.   . Enbrel [Etanercept] Rash  . Humira [Adalimumab] Rash  . Levofloxacin Other (See Comments)    REACTION: GI Intolerance BRAND NAME IS LEVAQUIN  . Sulfa Antibiotics Rash  . Sulfasalazine Rash    Patient Measurements: Height: 5\' 8"  (172.7 cm) Weight: 217 lb 11.2 oz (98.7 kg) IBW/kg (Calculated) : 68.4 Heparin Dosing Weight:   Vital Signs: Temp: 98.5 F (36.9 C) (11/21 0450) Temp Source: Oral (11/21 0450) BP: 136/66 (11/21 0450) Pulse Rate: 79 (11/21 0450)  Labs: Recent Labs    11/18/19 0322 11/19/19 0042 11/20/19 0050  HGB 14.4 14.7 14.5  HCT 43.7 44.5 43.8  PLT 122* 127* 149*  LABPROT 22.7* 25.0* 24.3*  INR 2.0* 2.3* 2.2*  CREATININE 1.28* 1.28* 1.11    Estimated Creatinine Clearance: 71.5 mL/min (by C-G formula based on SCr of 1.11 mg/dL).   Medications:  Scheduled:  . sodium chloride   Intravenous Once  . acidophilus  1 capsule Oral Daily  . allopurinol  100 mg Oral BID  . calcium-vitamin D  2 tablet Oral QHS  . dicyclomine  20 mg Oral BID  . famotidine  20 mg Oral BID  . fluticasone furoate-vilanterol  1 puff Inhalation Daily  . folic acid  2 mg Oral Daily  . furosemide  40 mg Oral Daily  . hydrocortisone   Rectal BID  . Ipratropium-Albuterol  1 puff Inhalation Q6H  . methylPREDNISolone (SOLU-MEDROL) injection  60 mg Intravenous Q12H  . pantoprazole  40 mg Oral Daily  . phenytoin  100 mg Oral q morning - 10a  . phenytoin  200 mg Oral QHS  . potassium chloride SA  20 mEq Oral Daily  . Warfarin - Pharmacist Dosing Inpatient   Does not apply q1800   Infusions:  .  sodium chloride Stopped (11/16/19 1416)  . remdesivir 100 mg in NS 250 mL Stopped (11/19/19 1030)    Assessment: Pharmacy consulted to dose warfarin in patient with history of DVT.  INR on admission is subtherapeutic at 1.5.  Home dose of warfarin listed as 3 mg daily with last dose given 11/16  INR remains therapeutic at 2.2.   CBC stable with Hgb WNL and Plt improved to 149k.  Hematochezia improving. Did receive 1 dose of levofloxacin on 11/17.  D-dimer 0.33.  LFT improving: AST/ALT 75/69  Goal of Therapy:  INR 2-3 Monitor platelets by anticoagulation protocol: Yes   Plan:  Warfarin 3 mg x 1 dose as per PTA regimen Monitor daily INR and s/s of bleeding.   Gretta Arab PharmD, BCPS Clinical pharmacist phone 7am- 5pm: [phone] 11/20/2019 7:57 AM

## 2019-11-21 ENCOUNTER — Inpatient Hospital Stay (HOSPITAL_COMMUNITY): Payer: Medicare Other

## 2019-11-21 LAB — CBC
HCT: 42.8 % (ref 39.0–52.0)
Hemoglobin: 14.2 g/dL (ref 13.0–17.0)
MCH: 29.8 pg (ref 26.0–34.0)
MCHC: 33.2 g/dL (ref 30.0–36.0)
MCV: 89.9 fL (ref 80.0–100.0)
Platelets: 159 10*3/uL (ref 150–400)
RBC: 4.76 MIL/uL (ref 4.22–5.81)
RDW: 14.2 % (ref 11.5–15.5)
WBC: 9.5 10*3/uL (ref 4.0–10.5)
nRBC: 0 % (ref 0.0–0.2)

## 2019-11-21 LAB — COMPREHENSIVE METABOLIC PANEL
ALT: 60 U/L — ABNORMAL HIGH (ref 0–44)
AST: 46 U/L — ABNORMAL HIGH (ref 15–41)
Albumin: 3.2 g/dL — ABNORMAL LOW (ref 3.5–5.0)
Alkaline Phosphatase: 55 U/L (ref 38–126)
Anion gap: 9 (ref 5–15)
BUN: 29 mg/dL — ABNORMAL HIGH (ref 8–23)
CO2: 26 mmol/L (ref 22–32)
Calcium: 9 mg/dL (ref 8.9–10.3)
Chloride: 106 mmol/L (ref 98–111)
Creatinine, Ser: 1.09 mg/dL (ref 0.61–1.24)
GFR calc Af Amer: >60 mL/min (ref >60)
GFR calc non Af Amer: >60 mL/min (ref >60)
Glucose, Bld: 135 mg/dL — ABNORMAL HIGH (ref 70–99)
Potassium: 4.2 mmol/L (ref 3.5–5.1)
Sodium: 141 mmol/L (ref 135–145)
Total Bilirubin: 0.4 mg/dL (ref 0.3–1.2)
Total Protein: 5.8 g/dL — ABNORMAL LOW (ref 6.5–8.1)

## 2019-11-21 LAB — PROTIME-INR
INR: 2.6 — ABNORMAL HIGH (ref 0.8–1.2)
Prothrombin Time: 27.5 seconds — ABNORMAL HIGH (ref 11.4–15.2)

## 2019-11-21 LAB — C-REACTIVE PROTEIN: CRP: <0.8 mg/dL (ref <1.0)

## 2019-11-21 LAB — D-DIMER, QUANTITATIVE: D-Dimer, Quant: 0.4 ug/mL-FEU (ref 0.00–0.50)

## 2019-11-21 MED ORDER — CEPASTAT 14.5 MG MT LOZG
1.0000 | LOZENGE | OROMUCOSAL | Status: DC | PRN
  Filled 2019-11-21: qty 9

## 2019-11-21 MED ORDER — GUAIFENESIN ER 600 MG PO TB12
1200.0000 mg | ORAL_TABLET | Freq: Two times a day (BID) | ORAL | Status: DC
  Administered 2019-11-21: 22:00:00 600 mg via ORAL
  Administered 2019-11-21 – 2019-11-23 (×4): 1200 mg via ORAL
  Filled 2019-11-21 (×5): qty 2

## 2019-11-21 MED ORDER — SODIUM CHLORIDE 0.9 % IV SOLN
500.0000 mg | INTRAVENOUS | Status: DC
  Administered 2019-11-21 – 2019-11-22 (×2): 500 mg via INTRAVENOUS
  Filled 2019-11-21 (×2): qty 500

## 2019-11-21 MED ORDER — WARFARIN SODIUM 3 MG PO TABS
3.0000 mg | ORAL_TABLET | Freq: Once | ORAL | Status: AC
  Administered 2019-11-21: 3 mg via ORAL
  Filled 2019-11-21: qty 1

## 2019-11-21 NOTE — Plan of Care (Signed)
Took patient for a walk we got half way down the hall and he started coughing really bad we had to stop he stood there coughing coughing until he spit up yellow mucous, he started wheezing and then he pushed himself to walk some more he started to desat into the high 80s I put him on 1 L and then he came back up the second lap around I turned the O2 off and he did good on room air 95%. Will walk again after patient has lunch.

## 2019-11-21 NOTE — Progress Notes (Signed)
Patient ambulated in hall on CM with portable PO2 at 1L. Patient had coughing fit and had to cut walk short. PO2 SATs remained greater the 91%

## 2019-11-21 NOTE — Progress Notes (Addendum)
PROGRESS NOTE  CALLAGHAN FOERST [id number] DOB: 1951-05-02 DOA: 11/16/2019 PCP: [name], MD  Brief History:   68 year old male with a history of remote arthritis, COPD, lower extremity DVTs, seizure disorder, prostate cancer presenting with 1 day history of shortness of breath, nonproductive cough, and generalized weakness.  The patient noted some wheezing that began on on 11/16/2019.  He states that he has chronic lower extremity edema which is about the same as usual.  In the emergency department, the patient was noted to have oxygen saturation of 88% room air.  Chest x-ray showed increased interstitial markings and right upper lobe opacity.  He was placed on 2 L nasal cannula with improvement of her oxygen saturation and 94-95%.  The patient was initially started on levofloxacin for presumptive community-acquired pneumonia prior to his Covid test coming back positive.  In addition, the patient states that he has had hematochezia for 1 day.  He has spoken with his gastroenterologist, Dr. Fuller Plan in the past.  He states that this is typical of his hemorrhoidal bleeding in the past that has been waxing and waning every 3 to 4 weeks.  He states that he was told to use Anusol suppositories in the past when this occurs.  Subjective:  Patient reports he is feeling weaker today, reports poor night sleep secondary to coughing, reports feeling more dyspneic, cough much more productive today, he denies any hematochezia .  Assessment/Plan:  Acute respiratory failure secondary to Covid 19 pneumonia - he is getting more hypoxic while sleeping (she is more likely in the setting of OSA ), as well as having some hypoxia on exertion, but overall he is tolerating room air at rest. -Treated with IV remdesivir x5 dose -Continue with IV Solu-Medrol.,   -Received convulsant plasma 11/20 -Continue to trend inflammatory markers, even though inflammatory markers are normal, best he is chronically on  Actemra for rheumatoid arthritis, most recent 10/24, which might be artificially lower in the setting of Actemra use .. -Procalcitonin within normal limit, levofloxacin has been stopped  COVID-19 Labs  Recent Labs    11/19/19 0042 11/20/19 0050 11/21/19 0025  DDIMER 0.40 0.33 0.40  CRP 0.9 <0.8 <0.8    Lab Results  Component Value Date   SARSCOV2NAA POSITIVE (A) 11/16/2019   [place] NEGATIVE 06/07/2019   [place] NEGATIVE 06/02/2019     COPD exacerbation -Patient continues to have significant wheezing, continue with IV steroids, having significant productive phlegm, will start on azithromycin as well -Continue with Combivent and Breo -Will check chest x-ray given persistent wheezing despite IV steroids  Transaminitis -In the setting of COVID-19, continue to monitor  Hematochezia -Likely secondary to hemorrhoidal bleed -Start Anusol suppositories -07/17/2016 colonoscopy--mild diverticulosis, grade 1 internal hemorrhoids -Hemoglobin has been stable  CKD stage 3 -baseline creatinine 1.1-1.4  Lower extremity DVT history -Patient endorses compliance with warfarin -Continue warfarin for now monitoring his hematochezia closely  Rheumatoid arthritis -holding arava  -pt did not recall dated of last Actemra infusion  Seizure disorder -Continue Dilantin  GERD -Continue PPI       Disposition Plan:   Home once stable  Family Communication:   Discussed with patient  Consultants:  none  Code Status:  FULL  DVT Prophylaxis:  warfarin   Procedures: As Listed in Progress Note Above  Antibiotics: None       Objective: Vitals:   11/20/19 2100 11/21/19 0420 11/21/19 0424 11/21/19 0706  BP:  (!) 160/81  [name])  157/102  Pulse:  (!) 105    Resp:  (!) 21  (!) 22  Temp: 98.7 F (37.1 C) 97.8 F (36.6 C)  98.1 F (36.7 C)  TempSrc: Oral Oral  Axillary  SpO2:  94%  94%  Weight:   98.9 kg   Height:        Intake/Output Summary (Last 24 hours) at  11/21/2019 1448 Last data filed at 11/21/2019 1023 Gross per 24 hour  Intake 240 ml  Output 900 ml  Net -660 ml   Weight change: 0.152 kg Exam:   Awake Alert, Oriented X 3, No new F.N deficits, Normal affect Symmetrical Chest wall movement, diminished air entry bilaterally, he remains with significant wheezing RRR,No Gallops,Rubs or new Murmurs, No Parasternal Heave +ve B.Sounds, Abd Soft, No tenderness, No rebound - guarding or rigidity. No Cyanosis, Clubbing or edema, No new Rash or bruise       Data Reviewed: I have personally reviewed following labs and imaging studies Basic Metabolic Panel: Recent Labs  Lab 11/17/19 0535 11/18/19 0322 11/18/19 2230 11/19/19 0042 11/20/19 0050 11/21/19 0025  NA 141 139  --  140 141 141  K 3.6 3.9  --  3.8 4.2 4.2  CL 101 101  --  100 101 106  CO2 27 26  --  26 30 26   GLUCOSE 146* 144*  --  198* 138* 135*  BUN 20 25*  --  26* 28* 29*  CREATININE 1.43* 1.28*  --  1.28* 1.11 1.09  CALCIUM 8.6* 8.5*  --  8.6* 8.7* 9.0  MG  --   --  2.0  --   --   --    Liver Function Tests: Recent Labs  Lab 11/16/19 0915 11/18/19 0322 11/19/19 0042 11/20/19 0050 11/21/19 0025  AST 64* 69* 92* 75* 46*  ALT 44 41 71* 69* 60*  ALKPHOS 66 52 61 56 55  BILITOT 1.0 0.8 0.5 0.9 0.4  PROT 7.1 6.2* 6.3* 6.2* 5.8*  ALBUMIN 4.1 3.6 3.5 3.6 3.2*   No results for input(s): LIPASE, AMYLASE in the last 168 hours. No results for input(s): AMMONIA in the last 168 hours. Coagulation Profile: Recent Labs  Lab 11/17/19 0535 11/18/19 0322 11/19/19 0042 11/20/19 0050 11/21/19 0025  INR 1.6* 2.0* 2.3* 2.2* 2.6*   CBC: Recent Labs  Lab 11/16/19 0915 11/17/19 0535 11/18/19 0322 11/19/19 0042 11/20/19 0050 11/21/19 0025  WBC 4.6 3.8* 5.7 6.0 7.0 9.5  NEUTROABS 2.8  --   --   --   --   --   HGB 15.4 15.0 14.4 14.7 14.5 14.2  HCT 48.1 46.1 43.7 44.5 43.8 42.8  MCV 91.3 91.3 91.0 91.8 91.4 89.9  PLT 108* 120* 122* 127* 149* 159   Cardiac  Enzymes: No results for input(s): CKTOTAL, CKMB, CKMBINDEX, TROPONINI in the last 168 hours. BNP: Invalid input(s): POCBNP CBG: No results for input(s): GLUCAP in the last 168 hours. HbA1C: No results for input(s): HGBA1C in the last 72 hours. Urine analysis:    Component Value Date/Time   COLORURINE YELLOW 11/29/2018 1033   APPEARANCEUR HAZY (A) 11/29/2018 1033   LABSPEC 1.025 11/29/2018 1033   PHURINE 5.0 11/29/2018 1033   GLUCOSEU NEGATIVE 11/29/2018 1033   HGBUR MODERATE (A) 11/29/2018 1033   BILIRUBINUR NEGATIVE 11/29/2018 1033   KETONESUR NEGATIVE 11/29/2018 1033   PROTEINUR 30 (A) 11/29/2018 1033   UROBILINOGEN 0.2 09/24/2012 1640   NITRITE NEGATIVE 11/29/2018 1033   LEUKOCYTESUR NEGATIVE 11/29/2018 1033  Sepsis Labs: @LABRCNTIP (procalcitonin:4,lacticidven:4) ) Recent Results (from the past 240 hour(s))  SARS CORONAVIRUS 2 (TAT 6-24 HRS) Nasopharyngeal Nasopharyngeal Swab     Status: Abnormal   Collection Time: 11/16/19  9:09 AM   Specimen: Nasopharyngeal Swab  Result Value Ref Range Status   SARS Coronavirus 2 POSITIVE (A) NEGATIVE Final    Comment: RESULT CALLED TO, READ BACK BY AND VERIFIED WITH: B JOHNSON,RN 2019 11/16/2019 D BRADLEY (NOTE) SARS-CoV-2 target nucleic acids are DETECTED. The SARS-CoV-2 RNA is generally detectable in upper and lower respiratory specimens during the acute phase of infection. Positive results are indicative of active infection with SARS-CoV-2. Clinical  correlation with patient history and other diagnostic information is necessary to determine patient infection status. Positive results do  not rule out bacterial infection or co-infection with other viruses. The expected result is Negative. Fact Sheet for Patients: SugarRoll.be Fact Sheet for Healthcare Providers: https://www.woods-mathews.com/ This test is not yet approved or cleared by the [place] FDA and  has been authorized for  detection and/or diagnosis of SARS-CoV-2 by FDA under an Emergency Use Authorization (EUA). This EUA will remain  in effect (meaning this test can be used) fo r the duration of the COVID-19 declaration under Section 564(b)(1) of the Act, 21 U.S.C. section 360bbb-3(b)(1), unless the authorization is terminated or revoked sooner. Performed at [facility], [place] [address], [place], [place] [address]   Blood Culture (routine x 2)     Status: None (Preliminary result)   Collection Time: 11/16/19 10:01 AM   Specimen: BLOOD RIGHT HAND  Result Value Ref Range Status   Specimen Description BLOOD RIGHT HAND  Final   Special Requests   Final    BOTTLES DRAWN AEROBIC AND ANAEROBIC Blood Culture results may not be optimal due to an inadequate volume of blood received in culture bottles   Culture   Final    NO GROWTH 4 DAYS Performed at [facility], [address], [place], [place] [address]    Report Status PENDING  Incomplete  Blood Culture (routine x 2)     Status: None (Preliminary result)   Collection Time: 11/16/19 10:02 AM   Specimen: BLOOD LEFT ARM  Result Value Ref Range Status   Specimen Description BLOOD LEFT ARM  Final   Special Requests   Final    BOTTLES DRAWN AEROBIC AND ANAEROBIC Blood Culture results may not be optimal due to an inadequate volume of blood received in culture bottles   Culture   Final    NO GROWTH 4 DAYS Performed at [facility], [address] [place], [place] [address]    Report Status PENDING  Incomplete     Scheduled Meds: . sodium chloride   Intravenous Once  . acidophilus  1 capsule Oral Daily  . allopurinol  100 mg Oral BID  . calcium-vitamin D  2 tablet Oral QHS  . dicyclomine  20 mg Oral BID  . famotidine  20 mg Oral BID  . fluticasone furoate-vilanterol  1 puff Inhalation Daily  . folic acid  2 mg Oral Daily  . furosemide  40 mg Oral Daily  . guaiFENesin  1,200 mg Oral BID  . Ipratropium-Albuterol  1 puff Inhalation Q6H  .  methylPREDNISolone (SOLU-MEDROL) injection  40 mg Intravenous Q8H  . pantoprazole  40 mg Oral Daily  . phenytoin  100 mg Oral q morning - 10a  . phenytoin  200 mg Oral QHS  . potassium chloride SA  20 mEq Oral Daily  .  warfarin  3 mg Oral ONCE-1800  . Warfarin - Pharmacist Dosing Inpatient   Does not apply q1800   Continuous Infusions: . sodium chloride Stopped (11/16/19 1416)  . azithromycin 500 mg (11/21/19 1200)    Procedures/Studies: Dg Chest Port 1 View  Result Date: 11/16/2019 CLINICAL DATA:  Shortness of breath, cold symptoms for a week, productive cough, fatigue, intermittent chest pain, decreased oxygen saturation, worsening shortness of breath this morning, history prostate cancer, former smoker, asthma EXAM: PORTABLE CHEST 1 VIEW COMPARISON:  Portable exam 0929 hours compared to 06/07/2019 FINDINGS: Chronic elevation LEFT diaphragm. Upper normal heart size with slight vascular congestion. Mediastinal contours normal. Atherosclerotic calcification aorta. LEFT basilar atelectasis. Mild infiltrate RIGHT upper lobe question pneumonia. No pleural effusion or pneumothorax. IMPRESSION: Suspected RIGHT upper lobe pneumonia. Chronic elevation of LEFT diaphragm with LEFT basilar atelectasis. Electronically Signed   By: Lavonia Dana M.D.   On: 11/16/2019 09:55    Phillips Climes, MD  [facility]   If 7PM-7AM, please contact night-coverage [email or web address] Password [email or web address] 11/21/2019, 2:48 PM   LOS: 5 days

## 2019-11-21 NOTE — Plan of Care (Signed)
  Problem: Education: Goal: Knowledge of General Education information will improve Description: Including pain rating scale, medication(s)/side effects and non-pharmacologic comfort measures Outcome: Progressing   Problem: Clinical Measurements: Goal: Will remain free from infection Outcome: Progressing   Problem: Safety: Goal: Ability to remain free from injury will improve Outcome: Progressing   Problem: Clinical Measurements: Goal: Respiratory complications will improve Outcome: Not Progressing

## 2019-11-21 NOTE — Progress Notes (Signed)
ANTICOAGULATION CONSULT NOTE - Follow Up Consult  Pharmacy Consult for Warfarin  Indication: history of DVT  Allergies  Allergen Reactions  . Orencia [Abatacept] Anaphylaxis    Had prostate cancer  . Carbamazepine Rash    REACTION: makes drowsy BRAND NAME IS TEGRETOL  . Celecoxib Itching and Rash    BRAND NAME IS CELEBREX  . Cephalexin Rash    "Severe Rash".  BRAND NAME IS KEFLEX.   . Enbrel [Etanercept] Rash  . Humira [Adalimumab] Rash  . Levofloxacin Other (See Comments)    REACTION: GI Intolerance BRAND NAME IS LEVAQUIN  . Sulfa Antibiotics Rash  . Sulfasalazine Rash    Patient Measurements: Height: 5\' 8"  (172.7 cm) Weight: 218 lb 0.6 oz (98.9 kg) IBW/kg (Calculated) : 68.4 Heparin Dosing Weight:   Vital Signs: Temp: 98.1 F (36.7 C) (11/22 0706) Temp Source: Axillary (11/22 0706) BP: 157/102 (11/22 0706) Pulse Rate: 105 (11/22 0420)  Labs: Recent Labs    11/19/19 0042 11/20/19 0050 11/21/19 0025  HGB 14.7 14.5 14.2  HCT 44.5 43.8 42.8  PLT 127* 149* 159  LABPROT 25.0* 24.3* 27.5*  INR 2.3* 2.2* 2.6*  CREATININE 1.28* 1.11 1.09    Estimated Creatinine Clearance: 72.9 mL/min (by C-G formula based on SCr of 1.09 mg/dL).   Medications:  Scheduled:  . sodium chloride   Intravenous Once  . acidophilus  1 capsule Oral Daily  . allopurinol  100 mg Oral BID  . calcium-vitamin D  2 tablet Oral QHS  . dicyclomine  20 mg Oral BID  . famotidine  20 mg Oral BID  . fluticasone furoate-vilanterol  1 puff Inhalation Daily  . folic acid  2 mg Oral Daily  . furosemide  40 mg Oral Daily  . guaiFENesin  1,200 mg Oral BID  . Ipratropium-Albuterol  1 puff Inhalation Q6H  . methylPREDNISolone (SOLU-MEDROL) injection  40 mg Intravenous Q8H  . pantoprazole  40 mg Oral Daily  . phenytoin  100 mg Oral q morning - 10a  . phenytoin  200 mg Oral QHS  . potassium chloride SA  20 mEq Oral Daily  . Warfarin - Pharmacist Dosing Inpatient   Does not apply q1800   Infusions:   . sodium chloride Stopped (11/16/19 1416)  . azithromycin      Assessment: Pharmacy consulted to dose warfarin in patient with history of DVT.  INR on admission is subtherapeutic at 1.5.  Home dose of warfarin listed as 3 mg daily with last dose given 11/16  INR today remains therapeutic at 2.6. CBC stable and LFTs downtrending, D-dimer wnl.   Goal of Therapy:  INR 2-3 Monitor platelets by anticoagulation protocol: Yes   Plan:  Warfarin 3 mg x 1 dose as per PTA regimen Monitor daily INR and s/s of bleeding.   Arrie Senate, PharmD, BCPS Clinical Pharmacist Please check AMION for all [place] numbers 11/21/2019

## 2019-11-21 NOTE — Plan of Care (Signed)
[name] did really well just now we walked 4 laps around the floor minimally coughed, towards the end he had some tightening in the chest no wheezing like the last time he stayed on room air 92-93%.

## 2019-11-22 LAB — COMPREHENSIVE METABOLIC PANEL WITH GFR
ALT: 49 U/L — ABNORMAL HIGH (ref 0–44)
AST: 34 U/L (ref 15–41)
Albumin: 3.3 g/dL — ABNORMAL LOW (ref 3.5–5.0)
Alkaline Phosphatase: 53 U/L (ref 38–126)
Anion gap: 10 (ref 5–15)
BUN: 32 mg/dL — ABNORMAL HIGH (ref 8–23)
CO2: 26 mmol/L (ref 22–32)
Calcium: 8.4 mg/dL — ABNORMAL LOW (ref 8.9–10.3)
Chloride: 102 mmol/L (ref 98–111)
Creatinine, Ser: 1.1 mg/dL (ref 0.61–1.24)
GFR calc Af Amer: >60 mL/min
GFR calc non Af Amer: >60 mL/min
Glucose, Bld: 100 mg/dL — ABNORMAL HIGH (ref 70–99)
Potassium: 3.7 mmol/L (ref 3.5–5.1)
Sodium: 138 mmol/L (ref 135–145)
Total Bilirubin: 0.9 mg/dL (ref 0.3–1.2)
Total Protein: 5.7 g/dL — ABNORMAL LOW (ref 6.5–8.1)

## 2019-11-22 LAB — CULTURE, BLOOD (ROUTINE X 2)
Culture: NO GROWTH
Culture: NO GROWTH

## 2019-11-22 LAB — PROTIME-INR
INR: 3.2 — ABNORMAL HIGH (ref 0.8–1.2)
Prothrombin Time: 32.8 s — ABNORMAL HIGH (ref 11.4–15.2)

## 2019-11-22 LAB — CBC
HCT: 42.6 % (ref 39.0–52.0)
Hemoglobin: 14.2 g/dL (ref 13.0–17.0)
MCH: 29.9 pg (ref 26.0–34.0)
MCHC: 33.3 g/dL (ref 30.0–36.0)
MCV: 89.7 fL (ref 80.0–100.0)
Platelets: 181 K/uL (ref 150–400)
RBC: 4.75 MIL/uL (ref 4.22–5.81)
RDW: 14.2 % (ref 11.5–15.5)
WBC: 11.1 K/uL — ABNORMAL HIGH (ref 4.0–10.5)
nRBC: 0 % (ref 0.0–0.2)

## 2019-11-22 LAB — D-DIMER, QUANTITATIVE: D-Dimer, Quant: 0.35 ug{FEU}/mL (ref 0.00–0.50)

## 2019-11-22 LAB — C-REACTIVE PROTEIN: CRP: <0.8 mg/dL

## 2019-11-22 MED ORDER — METHYLPREDNISOLONE SODIUM SUCC 40 MG IJ SOLR
40.0000 mg | Freq: Two times a day (BID) | INTRAMUSCULAR | Status: DC
  Administered 2019-11-22 – 2019-11-23 (×2): 40 mg via INTRAVENOUS
  Filled 2019-11-22 (×2): qty 1

## 2019-11-22 NOTE — Progress Notes (Signed)
ANTICOAGULATION CONSULT NOTE - Follow Up Consult  Pharmacy Consult for Warfarin  Indication: history of DVT  Allergies  Allergen Reactions  . Orencia [Abatacept] Anaphylaxis    Had prostate cancer  . Carbamazepine Rash    REACTION: makes drowsy BRAND NAME IS TEGRETOL  . Celecoxib Itching and Rash    BRAND NAME IS CELEBREX  . Cephalexin Rash    "Severe Rash".  BRAND NAME IS KEFLEX.   . Enbrel [Etanercept] Rash  . Humira [Adalimumab] Rash  . Levofloxacin Other (See Comments)    REACTION: GI Intolerance BRAND NAME IS LEVAQUIN  . Sulfa Antibiotics Rash  . Sulfasalazine Rash    Patient Measurements: Height: 5\' 8"  (172.7 cm) Weight: 218 lb 0.6 oz (98.9 kg) IBW/kg (Calculated) : 68.4 Heparin Dosing Weight:   Vital Signs: Temp: 98.4 F (36.9 C) (11/23 0113) Temp Source: Oral (11/23 0113) BP: 138/85 (11/23 0113) Pulse Rate: 80 (11/23 0113)  Labs: Recent Labs    11/20/19 0050 11/21/19 0025 11/22/19 0140  HGB 14.5 14.2 14.2  HCT 43.8 42.8 42.6  PLT 149* 159 181  LABPROT 24.3* 27.5* 32.8*  INR 2.2* 2.6* 3.2*  CREATININE 1.11 1.09 1.10    Estimated Creatinine Clearance: 72.3 mL/min (by C-G formula based on SCr of 1.1 mg/dL).   Medications:  Scheduled:  . sodium chloride   Intravenous Once  . acidophilus  1 capsule Oral Daily  . allopurinol  100 mg Oral BID  . calcium-vitamin D  2 tablet Oral QHS  . dicyclomine  20 mg Oral BID  . famotidine  20 mg Oral BID  . fluticasone furoate-vilanterol  1 puff Inhalation Daily  . folic acid  2 mg Oral Daily  . furosemide  40 mg Oral Daily  . guaiFENesin  1,200 mg Oral BID  . Ipratropium-Albuterol  1 puff Inhalation Q6H  . methylPREDNISolone (SOLU-MEDROL) injection  40 mg Intravenous Q8H  . pantoprazole  40 mg Oral Daily  . phenytoin  100 mg Oral q morning - 10a  . phenytoin  200 mg Oral QHS  . potassium chloride SA  20 mEq Oral Daily  . Warfarin - Pharmacist Dosing Inpatient   Does not apply q1800   Infusions:  .  sodium chloride Stopped (11/16/19 1416)  . azithromycin 500 mg (11/21/19 1200)    Assessment: Pharmacy consulted to dose warfarin in patient with history of DVT.  INR on admission is subtherapeutic at 1.5.  Home dose of warfarin listed as 3 mg daily with last dose given 11/16  INR has trended up - > 3.2 (? Due to azithromycin) No bleeding noted   Goal of Therapy:  INR 2-3 Monitor platelets by anticoagulation protocol: Yes   Plan:  Hold Warfarin today  Monitor daily INR and s/s of bleeding.  Thank you Anette Guarneri, PharmD  11/22/2019

## 2019-11-22 NOTE — Progress Notes (Signed)
PROGRESS NOTE  [name] [id number] DOB: 15-Feb-1951 DOA: 11/16/2019 PCP: [name], MD  Brief History:   68 year old male with a history of remote arthritis, COPD, lower extremity DVTs, seizure disorder, prostate cancer presenting with 1 day history of shortness of breath, nonproductive cough, and generalized weakness.  The patient noted some wheezing that began on on 11/16/2019.  He states that he has chronic lower extremity edema which is about the same as usual.  In the emergency department, the patient was noted to have oxygen saturation of 88% room air.  Chest x-ray showed increased interstitial markings and right upper lobe opacity.  He was placed on 2 L nasal cannula with improvement of her oxygen saturation and 94-95%.  The patient was initially started on levofloxacin for presumptive community-acquired pneumonia prior to his Covid test coming back positive.  In addition, the patient states that he has had hematochezia for 1 day.  He has spoken with his gastroenterologist, Dr. Fuller Plan in the past.  He states that this is typical of his hemorrhoidal bleeding in the past that has been waxing and waning every 3 to 4 weeks.  He states that he was told to use Anusol suppositories in the past when this occurs.  Subjective:  Patient reports he is having significant cough, more productive.  Ports dyspnea is improving, but still feeling far from baseline.  Assessment/Plan:  Acute respiratory failure secondary to Covid 19 pneumonia - he is getting more hypoxic while sleeping (she is more likely in the setting of OSA ), as well as having some hypoxia on exertion, but overall he is tolerating room air at rest. -Treated with IV remdesivir x5 dose -Continue with IV Solu-Medrol.,   -Received convulsant plasma 11/20 -Continue to trend inflammatory markers, even though inflammatory markers are normal, best he is chronically on Actemra for rheumatoid arthritis, most recent 10/24,  which might be artificially lower in the setting of Actemra use .. -Procalcitonin within normal limit, levofloxacin has been stopped  COVID-19 Labs  Recent Labs    11/20/19 0050 11/21/19 0025 11/22/19 0140  DDIMER 0.33 0.40 0.35  CRP <0.8 <0.8 <0.8    Lab Results  Component Value Date   SARSCOV2NAA POSITIVE (A) 11/16/2019   [place] NEGATIVE 06/07/2019   [place] NEGATIVE 06/02/2019     COPD exacerbation -Patient continues to have significant wheezing, continue with IV steroids, having significant productive phlegm, reports she feels it loosened his phlegm, after starting  Mucinex and azithromycin. -Continue with Combivent and Breo -Chest x-ray with no significant changes -We will taper his IV Solu-Medrol today  Transaminitis -In the setting of COVID-19, continue to monitor  Hematochezia -Likely secondary to hemorrhoidal bleed -Start Anusol suppositories -07/17/2016 colonoscopy--mild diverticulosis, grade 1 internal hemorrhoids -Hemoglobin has been stable  CKD stage 3 -baseline creatinine 1.1-1.4  Lower extremity DVT history -Patient endorses compliance with warfarin -Continue warfarin for now monitoring his hematochezia closely  Rheumatoid arthritis -holding arava  -pt did not recall dated of last Actemra infusion  Seizure disorder -Continue Dilantin  GERD -Continue PPI       Disposition Plan:   Home once stable  Family Communication:   Discussed with patient  Consultants:  none  Code Status:  FULL  DVT Prophylaxis:  warfarin   Procedures: As Listed in Progress Note Above  Antibiotics: None       Objective: Vitals:   11/21/19 0706 11/21/19 1658 11/21/19 1933 11/22/19 0113  BP: (!) 157/102  [name])  155/106 138/85  Pulse:   96 80  Resp: (!) 22  20 18   Temp: 98.1 F (36.7 C) 98.3 F (36.8 C) 98.6 F (37 C) 98.4 F (36.9 C)  TempSrc: Axillary Oral Oral Oral  SpO2: 94%  92% 93%  Weight:      Height:        Intake/Output  Summary (Last 24 hours) at 11/22/2019 1115 Last data filed at 11/22/2019 0600 Gross per 24 hour  Intake 360 ml  Output 1700 ml  Net -1340 ml   Weight change:  Exam:   Awake Alert, Oriented X 3, No new F.N deficits, Normal affect Symmetrical Chest wall movement, and with significant wheezing, but air entry significantly improved RRR,No Gallops,Rubs or new Murmurs, No Parasternal Heave +ve B.Sounds, Abd Soft, No tenderness, No rebound - guarding or rigidity. No Cyanosis, Clubbing or edema, No new Rash or bruise        Data Reviewed: I have personally reviewed following labs and imaging studies Basic Metabolic Panel: Recent Labs  Lab 11/18/19 0322 11/18/19 2230 11/19/19 0042 11/20/19 0050 11/21/19 0025 11/22/19 0140  NA 139  --  140 141 141 138  K 3.9  --  3.8 4.2 4.2 3.7  CL 101  --  100 101 106 102  CO2 26  --  26 30 26 26   GLUCOSE 144*  --  198* 138* 135* 100*  BUN 25*  --  26* 28* 29* 32*  CREATININE 1.28*  --  1.28* 1.11 1.09 1.10  CALCIUM 8.5*  --  8.6* 8.7* 9.0 8.4*  MG  --  2.0  --   --   --   --    Liver Function Tests: Recent Labs  Lab 11/18/19 0322 11/19/19 0042 11/20/19 0050 11/21/19 0025 11/22/19 0140  AST 69* 92* 75* 46* 34  ALT 41 71* 69* 60* 49*  ALKPHOS 52 61 56 55 53  BILITOT 0.8 0.5 0.9 0.4 0.9  PROT 6.2* 6.3* 6.2* 5.8* 5.7*  ALBUMIN 3.6 3.5 3.6 3.2* 3.3*   No results for input(s): LIPASE, AMYLASE in the last 168 hours. No results for input(s): AMMONIA in the last 168 hours. Coagulation Profile: Recent Labs  Lab 11/18/19 0322 11/19/19 0042 11/20/19 0050 11/21/19 0025 11/22/19 0140  INR 2.0* 2.3* 2.2* 2.6* 3.2*   CBC: Recent Labs  Lab 11/16/19 0915  11/18/19 0322 11/19/19 0042 11/20/19 0050 11/21/19 0025 11/22/19 0140  WBC 4.6   < > 5.7 6.0 7.0 9.5 11.1*  NEUTROABS 2.8  --   --   --   --   --   --   HGB 15.4   < > 14.4 14.7 14.5 14.2 14.2  HCT 48.1   < > 43.7 44.5 43.8 42.8 42.6  MCV 91.3   < > 91.0 91.8 91.4 89.9 89.7   PLT 108*   < > 122* 127* 149* 159 181   < > = values in this interval not displayed.   Cardiac Enzymes: No results for input(s): CKTOTAL, CKMB, CKMBINDEX, TROPONINI in the last 168 hours. BNP: Invalid input(s): POCBNP CBG: No results for input(s): GLUCAP in the last 168 hours. HbA1C: No results for input(s): HGBA1C in the last 72 hours. Urine analysis:    Component Value Date/Time   COLORURINE YELLOW 11/29/2018 1033   APPEARANCEUR HAZY (A) 11/29/2018 1033   LABSPEC 1.025 11/29/2018 1033   PHURINE 5.0 11/29/2018 1033   GLUCOSEU NEGATIVE 11/29/2018 1033   HGBUR MODERATE (A) 11/29/2018 1033   BILIRUBINUR  NEGATIVE 11/29/2018 1033   KETONESUR NEGATIVE 11/29/2018 1033   PROTEINUR 30 (A) 11/29/2018 1033   UROBILINOGEN 0.2 09/24/2012 1640   NITRITE NEGATIVE 11/29/2018 1033   LEUKOCYTESUR NEGATIVE 11/29/2018 1033   Sepsis Labs: @LABRCNTIP (procalcitonin:4,lacticidven:4) ) Recent Results (from the past 240 hour(s))  SARS CORONAVIRUS 2 (TAT 6-24 HRS) Nasopharyngeal Nasopharyngeal Swab     Status: Abnormal   Collection Time: 11/16/19  9:09 AM   Specimen: Nasopharyngeal Swab  Result Value Ref Range Status   SARS Coronavirus 2 POSITIVE (A) NEGATIVE Final    Comment: RESULT CALLED TO, READ BACK BY AND VERIFIED WITH: B JOHNSON,RN 2019 11/16/2019 D BRADLEY (NOTE) SARS-CoV-2 target nucleic acids are DETECTED. The SARS-CoV-2 RNA is generally detectable in upper and lower respiratory specimens during the acute phase of infection. Positive results are indicative of active infection with SARS-CoV-2. Clinical  correlation with patient history and other diagnostic information is necessary to determine patient infection status. Positive results do  not rule out bacterial infection or co-infection with other viruses. The expected result is Negative. Fact Sheet for Patients: SugarRoll.be Fact Sheet for Healthcare Providers: https://www.woods-mathews.com/  This test is not yet approved or cleared by the [place] FDA and  has been authorized for detection and/or diagnosis of SARS-CoV-2 by FDA under an Emergency Use Authorization (EUA). This EUA will remain  in effect (meaning this test can be used) fo r the duration of the COVID-19 declaration under Section 564(b)(1) of the Act, 21 U.S.C. section 360bbb-3(b)(1), unless the authorization is terminated or revoked sooner. Performed at [facility], [place] [address] [place], [place] [address]   Blood Culture (routine x 2)     Status: None   Collection Time: 11/16/19 10:01 AM   Specimen: BLOOD RIGHT HAND  Result Value Ref Range Status   Specimen Description BLOOD RIGHT HAND  Final   Special Requests   Final    BOTTLES DRAWN AEROBIC AND ANAEROBIC Blood Culture results may not be optimal due to an inadequate volume of blood received in culture bottles   Culture   Final    NO GROWTH 6 DAYS Performed at [facility], [address] [place], [place] [address]    Report Status 11/22/2019 FINAL  Final  Blood Culture (routine x 2)     Status: None   Collection Time: 11/16/19 10:02 AM   Specimen: BLOOD LEFT ARM  Result Value Ref Range Status   Specimen Description BLOOD LEFT ARM  Final   Special Requests   Final    BOTTLES DRAWN AEROBIC AND ANAEROBIC Blood Culture results may not be optimal due to an inadequate volume of blood received in culture bottles   Culture   Final    NO GROWTH 6 DAYS Performed at [facility], [address] [place], [place] [address]    Report Status 11/22/2019 FINAL  Final     Scheduled Meds: . sodium chloride   Intravenous Once  . acidophilus  1 capsule Oral Daily  . allopurinol  100 mg Oral BID  . calcium-vitamin D  2 tablet Oral QHS  . dicyclomine  20 mg Oral BID  . famotidine  20 mg Oral BID  . fluticasone furoate-vilanterol  1 puff Inhalation Daily  . folic acid  2 mg Oral Daily  . furosemide  40 mg Oral Daily  . guaiFENesin  1,200 mg Oral BID   . Ipratropium-Albuterol  1 puff Inhalation Q6H  . methylPREDNISolone (SOLU-MEDROL) injection  40 mg Intravenous Q8H  . pantoprazole  40 mg Oral Daily  . phenytoin  100 mg Oral q morning - 10a  . phenytoin  200 mg Oral QHS  . potassium chloride SA  20 mEq Oral Daily  . Warfarin - Pharmacist Dosing Inpatient   Does not apply q1800   Continuous Infusions: . sodium chloride Stopped (11/16/19 1416)  . azithromycin 500 mg (11/22/19 1019)    Procedures/Studies: Dg Chest Port 1 View  Result Date: 11/21/2019 CLINICAL DATA:  COVID-19 pneumonia, wheezing EXAM: PORTABLE CHEST 1 VIEW COMPARISON:  11/16/2019 FINDINGS: Stable cardiomediastinal contours. Calcific aortic knob. Chronic elevation of the left hemidiaphragm. Hazy opacity in the periphery of the right upper lobe, not significantly changed from prior. No new focal airspace consolidation. No pleural effusion or pneumothorax. IMPRESSION: No significant change in the appearance hazy right upper lobe airspace opacity. Electronically Signed   By: Davina Poke M.D.   On: 11/21/2019 15:38   Dg Chest Port 1 View  Result Date: 11/16/2019 CLINICAL DATA:  Shortness of breath, cold symptoms for a week, productive cough, fatigue, intermittent chest pain, decreased oxygen saturation, worsening shortness of breath this morning, history prostate cancer, former smoker, asthma EXAM: PORTABLE CHEST 1 VIEW COMPARISON:  Portable exam 0929 hours compared to 06/07/2019 FINDINGS: Chronic elevation LEFT diaphragm. Upper normal heart size with slight vascular congestion. Mediastinal contours normal. Atherosclerotic calcification aorta. LEFT basilar atelectasis. Mild infiltrate RIGHT upper lobe question pneumonia. No pleural effusion or pneumothorax. IMPRESSION: Suspected RIGHT upper lobe pneumonia. Chronic elevation of LEFT diaphragm with LEFT basilar atelectasis. Electronically Signed   By: Lavonia Dana M.D.   On: 11/16/2019 09:55    Phillips Climes, MD  [facility]   If 7PM-7AM, please contact night-coverage [email or web address] Password [email or web address] 11/22/2019, 11:15 AM   LOS: 6 days

## 2019-11-23 ENCOUNTER — Encounter (HOSPITAL_COMMUNITY): Payer: Medicare Other

## 2019-11-23 LAB — PROTIME-INR
INR: 2.8 — ABNORMAL HIGH (ref 0.8–1.2)
Prothrombin Time: 29.1 seconds — ABNORMAL HIGH (ref 11.4–15.2)

## 2019-11-23 MED ORDER — ALBUTEROL SULFATE (2.5 MG/3ML) 0.083% IN NEBU: 2.5000 mg | INHALATION_SOLUTION | Freq: Four times a day (QID) | RESPIRATORY_TRACT | 3 refills | Status: DC | PRN

## 2019-11-23 MED ORDER — AZITHROMYCIN 250 MG PO TABS: 500.0000 mg | ORAL_TABLET | Freq: Every day | ORAL | 0 refills | Status: DC

## 2019-11-23 MED ORDER — PREDNISONE 10 MG (21) PO TBPK: ORAL_TABLET | ORAL | 0 refills | Status: DC

## 2019-11-23 MED ORDER — ACETAMINOPHEN 325 MG PO TABS: 650.0000 mg | ORAL_TABLET | Freq: Four times a day (QID) | ORAL | Status: DC | PRN

## 2019-11-23 MED ORDER — AZITHROMYCIN 250 MG PO TABS
500.0000 mg | ORAL_TABLET | Freq: Every day | ORAL | Status: DC
  Administered 2019-11-23: 500 mg via ORAL
  Filled 2019-11-23: qty 2

## 2019-11-23 MED ORDER — WARFARIN SODIUM 3 MG PO TABS
3.0000 mg | ORAL_TABLET | Freq: Once | ORAL | Status: DC
  Filled 2019-11-23: qty 1

## 2019-11-23 NOTE — Progress Notes (Addendum)
0745: Assumed care of Pt from night RN. Pt already sitting EOB, assessed, denies pain, VS WNL, SpO2 98% on room air. Pt happy to be possibly discharged today. Assisted to chair, reminded to call if have to get up again, denies needs at this time, will monitor  1230: Discharge education and paperwork given. Pt denied assistance getting dressed. PIV removed, awaiting on spouse arrival.

## 2019-11-23 NOTE — Progress Notes (Signed)
ANTICOAGULATION CONSULT NOTE - Follow Up Consult  Pharmacy Consult for Warfarin  Indication: history of DVT  Allergies  Allergen Reactions  . Orencia [Abatacept] Anaphylaxis    Had prostate cancer  . Carbamazepine Rash    REACTION: makes drowsy BRAND NAME IS TEGRETOL  . Celecoxib Itching and Rash    BRAND NAME IS CELEBREX  . Cephalexin Rash    "Severe Rash".  BRAND NAME IS KEFLEX.   . Enbrel [Etanercept] Rash  . Humira [Adalimumab] Rash  . Levofloxacin Other (See Comments)    REACTION: GI Intolerance BRAND NAME IS LEVAQUIN  . Sulfa Antibiotics Rash  . Sulfasalazine Rash    Patient Measurements: Height: 5\' 8"  (172.7 cm) Weight: 215 lb 6.4 oz (97.7 kg) IBW/kg (Calculated) : 68.4 Heparin Dosing Weight:   Vital Signs: Temp: 98 F (36.7 C) (11/24 0441) Temp Source: Oral (11/24 0441) BP: 126/73 (11/24 0441) Pulse Rate: 76 (11/24 0441)  Labs: Recent Labs    11/21/19 0025 11/22/19 0140 11/23/19 0434  HGB 14.2 14.2  --   HCT 42.8 42.6  --   PLT 159 181  --   LABPROT 27.5* 32.8* 29.1*  INR 2.6* 3.2* 2.8*  CREATININE 1.09 1.10  --     Estimated Creatinine Clearance: 71.8 mL/min (by C-G formula based on SCr of 1.1 mg/dL).   Medications:  Scheduled:  . sodium chloride   Intravenous Once  . acidophilus  1 capsule Oral Daily  . allopurinol  100 mg Oral BID  . azithromycin  500 mg Oral Daily  . calcium-vitamin D  2 tablet Oral QHS  . dicyclomine  20 mg Oral BID  . famotidine  20 mg Oral BID  . fluticasone furoate-vilanterol  1 puff Inhalation Daily  . folic acid  2 mg Oral Daily  . furosemide  40 mg Oral Daily  . guaiFENesin  1,200 mg Oral BID  . Ipratropium-Albuterol  1 puff Inhalation Q6H  . methylPREDNISolone (SOLU-MEDROL) injection  40 mg Intravenous Q12H  . pantoprazole  40 mg Oral Daily  . phenytoin  100 mg Oral q morning - 10a  . phenytoin  200 mg Oral QHS  . potassium chloride SA  20 mEq Oral Daily  . Warfarin - Pharmacist Dosing Inpatient   Does not  apply q1800   Infusions:  . sodium chloride Stopped (11/16/19 1416)    Assessment: Pharmacy consulted to dose warfarin in patient with history of DVT.  INR on admission is subtherapeutic at 1.5.  Home dose of warfarin listed as 3 mg daily with last dose given 11/16  INR 2.8 No bleeding noted   Goal of Therapy:  INR 2-3 Monitor platelets by anticoagulation protocol: Yes   Plan:  Warfarin 3 mg po x 1 today at 1800 pm Monitor daily INR and s/s of bleeding.  Thank you Anette Guarneri, PharmD  11/23/2019

## 2019-11-23 NOTE — Plan of Care (Signed)
  Problem: Clinical Measurements: Goal: Ability to maintain clinical measurements within normal limits will improve Outcome: Progressing   Problem: Clinical Measurements: Goal: Respiratory complications will improve Outcome: Progressing   Problem: Clinical Measurements: Goal: Cardiovascular complication will be avoided Outcome: Progressing   

## 2019-11-23 NOTE — Discharge Instructions (Signed)
Person Under Monitoring Name: [name]  Location: [place] [address]   Infection Prevention Recommendations for Individuals Confirmed to have, or Being Evaluated for, 2019 Novel Coronavirus (COVID-19) Infection Who Receive Care at Home  Individuals who are confirmed to have, or are being evaluated for, COVID-19 should follow the prevention steps below until a healthcare provider or local or state health department says they can return to normal activities.  Stay home except to get medical care You should restrict activities outside your home, except for getting medical care. Do not go to work, school, or public areas, and do not use public transportation or taxis.  Call ahead before visiting your doctor Before your medical appointment, call the healthcare provider and tell them that you have, or are being evaluated for, COVID-19 infection. This will help the healthcare providers office take steps to keep other people from getting infected. Ask your healthcare provider to call the local or state health department.  Monitor your symptoms Seek prompt medical attention if your illness is worsening (e.g., difficulty breathing). Before going to your medical appointment, call the healthcare provider and tell them that you have, or are being evaluated for, COVID-19 infection. Ask your healthcare provider to call the local or state health department.  Wear a facemask You should wear a facemask that covers your nose and mouth when you are in the same room with other people and when you visit a healthcare provider. People who live with or visit you should also wear a facemask while they are in the same room with you.  Separate yourself from other people in your home As much as possible, you should stay in a different room from other people in your home. Also, you should use a separate bathroom, if available.  Avoid sharing household items You should not share  dishes, drinking glasses, cups, eating utensils, towels, bedding, or other items with other people in your home. After using these items, you should wash them thoroughly with soap and water.  Cover your coughs and sneezes Cover your mouth and nose with a tissue when you cough or sneeze, or you can cough or sneeze into your sleeve. Throw used tissues in a lined trash can, and immediately wash your hands with soap and water for at least 20 seconds or use an alcohol-based hand rub.  Wash your [facility] your hands often and thoroughly with soap and water for at least 20 seconds. You can use an alcohol-based hand sanitizer if soap and water are not available and if your hands are not visibly dirty. Avoid touching your eyes, nose, and mouth with unwashed hands.   Prevention Steps for Caregivers and Household Members of Individuals Confirmed to have, or Being Evaluated for, COVID-19 Infection Being Cared for in the Home  If you live with, or provide care at home for, a person confirmed to have, or being evaluated for, COVID-19 infection please follow these guidelines to prevent infection:  Follow healthcare providers instructions Make sure that you understand and can help the patient follow any healthcare provider instructions for all care.  Provide for the patients basic needs You should help the patient with basic needs in the home and provide support for getting groceries, prescriptions, and other personal needs.  Monitor the patients symptoms If they are getting sicker, call his or her medical provider and tell them that the patient has, or is being evaluated for, COVID-19 infection. This will help the healthcare providers office  take steps to keep other people from getting infected. Ask the healthcare provider to call the local or state health department.  Limit the number of people who have contact with the patient  If possible, have only one caregiver for the patient.  Other  household members should stay in another home or place of residence. If this is not possible, they should stay  in another room, or be separated from the patient as much as possible. Use a separate bathroom, if available.  Restrict visitors who do not have an essential need to be in the home.  Keep older adults, very young children, and other sick people away from the patient Keep older adults, very young children, and those who have compromised immune systems or chronic health conditions away from the patient. This includes people with chronic heart, lung, or kidney conditions, diabetes, and cancer.  Ensure good ventilation Make sure that shared spaces in the home have good air flow, such as from an air conditioner or an opened window, weather permitting.  Wash your hands often  Wash your hands often and thoroughly with soap and water for at least 20 seconds. You can use an alcohol based hand sanitizer if soap and water are not available and if your hands are not visibly dirty.  Avoid touching your eyes, nose, and mouth with unwashed hands.  Use disposable paper towels to dry your hands. If not available, use dedicated cloth towels and replace them when they become wet.  Wear a facemask and gloves  Wear a disposable facemask at all times in the room and gloves when you touch or have contact with the patients blood, body fluids, and/or secretions or excretions, such as sweat, saliva, sputum, nasal mucus, vomit, urine, or feces.  Ensure the mask fits over your nose and mouth tightly, and do not touch it during use.  Throw out disposable facemasks and gloves after using them. Do not reuse.  Wash your hands immediately after removing your facemask and gloves.  If your personal clothing becomes contaminated, carefully remove clothing and launder. Wash your hands after handling contaminated clothing.  Place all used disposable facemasks, gloves, and other waste in a lined container before  disposing them with other household waste.  Remove gloves and wash your hands immediately after handling these items.  Do not share dishes, glasses, or other household items with the patient  Avoid sharing household items. You should not share dishes, drinking glasses, cups, eating utensils, towels, bedding, or other items with a patient who is confirmed to have, or being evaluated for, COVID-19 infection.  After the person uses these items, you should wash them thoroughly with soap and water.  Wash laundry thoroughly  Immediately remove and wash clothes or bedding that have blood, body fluids, and/or secretions or excretions, such as sweat, saliva, sputum, nasal mucus, vomit, urine, or feces, on them.  Wear gloves when handling laundry from the patient.  Read and follow directions on labels of laundry or clothing items and detergent. In general, wash and dry with the warmest temperatures recommended on the label.  Clean all areas the individual has used often  Clean all touchable surfaces, such as counters, tabletops, doorknobs, bathroom fixtures, toilets, phones, keyboards, tablets, and bedside tables, every day. Also, clean any surfaces that may have blood, body fluids, and/or secretions or excretions on them.  Wear gloves when cleaning surfaces the patient has come in contact with.  Use a diluted bleach solution (e.g., dilute bleach with 1 part  bleach and 10 parts water) or a household disinfectant with a label that says EPA-registered for coronaviruses. To make a bleach solution at home, add 1 tablespoon of bleach to 1 quart (4 cups) of water. For a larger supply, add  cup of bleach to 1 gallon (16 cups) of water.  Read labels of cleaning products and follow recommendations provided on product labels. Labels contain instructions for safe and effective use of the cleaning product including precautions you should take when applying the product, such as wearing gloves or eye protection  and making sure you have good ventilation during use of the product.  Remove gloves and wash hands immediately after cleaning.  Monitor yourself for signs and symptoms of illness Caregivers and household members are considered close contacts, should monitor their health, and will be asked to limit movement outside of the home to the extent possible. Follow the monitoring steps for close contacts listed on the symptom monitoring form.   ? If you have additional questions, contact your local health department or call the epidemiologist on call at [phone] (available 24/7). ? This guidance is subject to change. For the most up-to-date guidance from [facility], please refer to their website: YouBlogs.pl

## 2019-11-23 NOTE — Progress Notes (Signed)
SATURATION QUALIFICATIONS:   Patient Saturations on Room Air at Rest = 98%  Patient Saturations on Room Air while Ambulating = 93%  Patient Saturations on 0 Liters of oxygen while Ambulating = 93%  Pt does not need oxygen to ambulate.

## 2019-11-23 NOTE — Discharge Summary (Signed)
[name], is a 68 y.o. male  DOB 01/16/1951  MRN [id number].  Admission date:  11/16/2019  Admitting Physician  No admitting provider for patient encounter.  Discharge Date:  11/23/2019   Primary MD  [name], MD  Recommendations for primary care physician for things to follow:  -Please check CBC, CMP during next visit   Admission Diagnosis  Acute on chronic respiratory failure with hypoxia ([place]) [J96.21] Pneumonia of right upper lobe due to infectious organism [J18.9]   Discharge Diagnosis  Acute on chronic respiratory failure with hypoxia ([place]) [J96.21] Pneumonia of right upper lobe due to infectious organism [J18.9]    Principal Problem:   Acute respiratory failure with hypoxia ([place]) Active Problems:   GERD   Personal history of DVT (deep vein thrombosis)   Chronic anticoagulation   Rheumatoid arthritis (HCC)   Seizure disorder ([place])   Malignant neoplasm of prostate ([place])   COPD with acute exacerbation ([place])   Chronic diastolic HF (heart failure) ([place])   CAP (community acquired pneumonia)   Pneumonia due to COVID-19 virus   Acute hypoxemic respiratory failure due to COVID-19 [facility])      Past Medical History:  Diagnosis Date   Anxiety    hx of    Arthritis    RA   Asthma    CHF (congestive heart failure) (HCC)    Clotting disorder (HCC)    DVT both legs    COPD (chronic obstructive pulmonary disease) ([place])    DDD (degenerative disc disease), cervical    with UE's paresthesias   Diverticulosis    DVT, lower extremity ([place])    bilat   Eye abnormality    right eye drifts has difficulty focusing 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 Unknown origin.   Shortness of breath dyspnea    exertion    Sleep apnea    not on cpap   Tubular adenoma of colon 04/2008    Past Surgical History:  Procedure Laterality Date   CHOLECYSTECTOMY     COLONOSCOPY     CYSTOSCOPY WITH LITHOLAPAXY N/A 03/17/2019   Procedure: CYSTOSCOPY WITH LITHOLAPAXY AND REMOVAL OF FOREIGN BODY;  Surgeon: [name], MD;  Location: [facility];  Service: Urology;  Laterality: N/A;   DOPPLER ECHOCARDIOGRAPHY N/A 01-21-2012   TECHNICALLY DIFFICULT. MILD CONCENTRIC LV HYPERTROPHY. LV CAVITY IS SMALL.. EF=> 55%. TRANSMITRAL SPECTRAL FLOW PATTREN IS SUGGESTIVE OF IMPAIRED LV RELAXATION. RV SYSTOLIC PRESSURE IS [id number]. LEFT ATRIAL SIZE IS NORMAL. AV APPEARS MILDLY SCLEROTIC. NO SIGN VALVE  DISEASE NOTED.   LYMPHADENECTOMY Bilateral 08/30/2015   Procedure: PELVIC LYMPHADENECTOMY;  Surgeon: [name], MD;  Location: [facility];  Service: Urology;  Laterality: Bilateral;   NUCLEAR STRESS TEST N/A 01-21-2012   NORMAL PATTERN OF PERFUSION IN ALL REGIONS. POST STRESS LV SIZE IS NORMAL. NO EVIDENCE OF INDUCIBLE ISCHEMIA. EF 54%.   POLYPECTOMY     PROSTATE BIOPSY     ROBOT ASSISTED LAPAROSCOPIC RADICAL PROSTATECTOMY N/A 08/30/2015   Procedure: ROBOTIC ASSISTED LAPAROSCOPIC RADICAL PROSTATECTOMY;  Surgeon: [name], MD;  Location: [facility];  Service: Urology;  Laterality: N/A;   US VENOUS LOWER EXT Right 03/07/11   PERSISTENT DVT IN RIGHT LOWER EXT.WITH PERSISTANT VISUALIZATION OF HYPOECHOIC THROMBUS WITHIN THE FEMORAL, PROFUNDA FEMORAL AND POPLITEAL VEINS. WHEN COMPARED TO PREVIOUS, CLOT IS NO LONGER IDENTIFIED WITH IN THE RIGHT CFV.       History of present illness and  Hospital Course:     Kindly see H&P for history of present illness and admission details, please review complete Labs, Consult  reports and Test reports for all details in brief  HPI  from the history and physical done on the day of admission 11/16/2019  HPI: [name] is a 68 y.o. male with significant past medical history significant for rheumatoid arthritis, asthma/COPD, bilateral lower extremity DVTs, gastroesophageal disease, gout, seizure disorder, prostate cancer and internal hemorrhoids/diverticulosis; who presented to the hospital secondary to general malaise, shortness of breath, easy fatigability and productive cough.  Patient reports of hypoxia in the 88 percentage on room air at home.  He has noticed increasing wheezing.  Reports wife with similar symptoms.  No fever, no Eliquis male for loosing days; no chills, no hemoptysis, no chest pain, no palpitations, no increased swelling or orthopnea reported. In the ED work-up demonstrated abnormal inflammatory markers, right upper lobe infiltrates on x-ray and hypoxia.  Patient with increased respiratory rate.  Covid test has been ordered, but pending. Influenza panel checked and negative; cultures taken, started on IV antibiotics and the steroids given.  [place] RN contacted to admit patient for further evaluation and management.   Hospital Course   Acute respiratory failure secondary to Covid 19 pneumonia -Hypoxia has resolved, he is on room air on discharge, oxygen requirement, initial hypoxia due to COVID-19 pneumonia, but was most likely related to COPD exacerbation . -Patient was treated with IV  -Treated with IV remdesivir x5 dose -Treated  with IV Solu-Medrol.,    He will be discharged on prednisone taper -Received convulsant plasma 11/20 -Telemetry markers has been normal, even though inflammatory markers are normal, given he is chronically on Actemra for rheumatoid arthritis, most recent 10/24, which might be artificially lower in the setting of Actemra use . -Procalcitonin within normal limit, levofloxacin has been stopped on admission  COVID-19  Labs  Recent Labs (last 2 labs)        Recent Labs    11/20/19 0050 11/21/19 0025 11/22/19 0140  DDIMER 0.33 0.40 0.35  CRP <0.8 <0.8 <0.8      Recent Labs       Lab Results  Component Value Date   SARSCOV2NAA POSITIVE (A) 11/16/2019   [place] NEGATIVE 06/07/2019   [place] NEGATIVE 06/02/2019       COPD exacerbation -Patient did develop significant wheezing, treated with IV steroids and azithromycin given he has significant productive's phlegm due to COPD exacerbation, significantly improved, he will be discharged on prednisone taper, and azithromycin .  Transaminitis -In the setting of COVID-19, trending  down  Hematochezia -Likely secondary to hemorrhoidal bleed, no recurrence during hospital stay, resolved after starting hydrocortisone cream for hemorrhoids -07/17/2016 colonoscopy--mild diverticulosis, grade 1 internal hemorrhoids -Hemoglobin has been stable  CKD stage 3 -baseline creatinine 1.1-1.4  Lower extremity DVT history -Patient endorses compliance with warfarin  Rheumatoid arthritis -Resume home medications  Seizure disorder -Continue Dilantin  GERD -Continue PPI    Discharge Condition: stable   Discharge Instructions  and  Discharge Medications     Discharge Instructions    Discharge instructions   Complete by: As directed    Follow with Primary MD [name], MD in 14 days   Get CBC, CMP,checked  by Primary MD next visit.    Activity: As tolerated with Full fall precautions use walker/cane & assistance as needed   Disposition Home   Diet: Heart Healthy  , with feeding assistance and aspiration precautions.  For Heart failure patients - Check your Weight same time everyday, if you gain over 2 pounds, or you develop in leg swelling, experience more shortness of breath or chest pain, call your Primary MD immediately. Follow Cardiac Low Salt Diet and 1.5 lit/day fluid restriction.   On your next visit with  your primary care physician please Get Medicines reviewed and adjusted.   Please request your Prim.MD to go over all Hospital Tests and Procedure/Radiological results at the follow up, please get all Hospital records sent to your Prim MD by signing hospital release before you go home.   If you experience worsening of your admission symptoms, develop shortness of breath, life threatening emergency, suicidal or homicidal thoughts you must seek medical attention immediately by calling 911 or calling your MD immediately  if symptoms less severe.  You Must read complete instructions/literature along with all the possible adverse reactions/side effects for all the Medicines you take and that have been prescribed to you. Take any new Medicines after you have completely understood and accpet all the possible adverse reactions/side effects.   Do not drive, operating heavy machinery, perform activities at heights, swimming or participation in water activities or provide baby sitting services if your were admitted for syncope or siezures until you have seen by Primary MD or a Neurologist and advised to do so again.  Do not drive when taking Pain medications.    Do not take more than prescribed Pain, Sleep and Anxiety Medications  Special Instructions: If you have smoked or chewed Tobacco  in the last 2 yrs please stop smoking, stop any regular Alcohol  and or any Recreational drug use.  Wear Seat belts while driving.   Please note  You were cared for by a hospitalist during your hospital stay. If you have any questions about your discharge medications or the care you received while you were in the hospital after you are discharged, you can call the unit and asked to speak with the hospitalist on call if the hospitalist that took care of you is not available. Once you are discharged, your primary care physician will handle any further medical issues. Please note that NO REFILLS for any discharge  medications will be authorized once you are discharged, as it is imperative that you return to your primary care physician (or establish a relationship with a primary care physician if you do not have one) for your aftercare needs so that they can reassess your need for medications and monitor your lab values.   Increase activity slowly   Complete by: As directed  Allergies as of 11/23/2019      Reactions   Orencia [abatacept] Anaphylaxis   Had prostate cancer   Carbamazepine Rash   REACTION: makes drowsy BRAND NAME IS TEGRETOL   Celecoxib Itching, Rash   BRAND NAME IS CELEBREX   Cephalexin Rash   "Severe Rash".  BRAND NAME IS KEFLEX.    Enbrel [etanercept] Rash   Humira [adalimumab] Rash   Levofloxacin Other (See Comments)   REACTION: GI Intolerance BRAND NAME IS LEVAQUIN   Sulfa Antibiotics Rash   Sulfasalazine Rash      Medication List    TAKE these medications   acetaminophen 325 MG tablet Commonly known as: TYLENOL Take 2 tablets (650 mg total) by mouth every 6 (six) hours as needed for mild pain, fever or headache. What changed:   medication strength  how much to take  reasons to take this   ACTEMRA IV Inject into the vein every 28 (twenty-eight) days. For Rheumatoid Arthritis   albuterol (2.5 MG/3ML) 0.083% nebulizer solution Commonly known as: PROVENTIL Take 3 mLs (2.5 mg total) by nebulization every 6 (six) hours as needed for wheezing or shortness of breath.   alendronate 70 MG tablet Commonly known as: FOSAMAX TAKE 1 TABLET BY MOUTH ONCE A WEEK. TAKE WITH FULL GLASS OF WATER ON EMPTY STOMACH   allopurinol 100 MG tablet Commonly known as: ZYLOPRIM TAKE 1 TABLET BY MOUTH TWICE A DAY   azithromycin 250 MG tablet Commonly known as: ZITHROMAX Take 2 tablets (500 mg total) by mouth daily. Start taking on: November 24, 2019   Breo Ellipta 100-25 MCG/INH Aepb Generic drug: fluticasone furoate-vilanterol USE 1 INHALATION ORALLY    DAILY   CITRACAL  +D3 PO Take 2 tablets by mouth 2 (two) times daily.   diclofenac sodium 1 % Gel Commonly known as: VOLTAREN Apply 3 grams to 3 large joints up to 3 times daily What changed:   how much to take  how to take this  when to take this  reasons to take this   dicyclomine 10 MG capsule Commonly known as: BENTYL TAKE 1 CAPSULE BY MOUTH 4 TIMES DAILY BEFORE MEALS AND AT BEDTIME What changed: See the new instructions.   Dilantin 100 MG ER capsule Generic drug: phenytoin Take 100-200 mg by mouth See admin instructions. Take one capsule in the morning and 2 capsules at bedtime   folic acid 1 MG tablet Commonly known as: FOLVITE TAKE 2 TABLETS BY MOUTH EVERY DAY IN THE MORNING What changed: See the new instructions.   furosemide 40 MG tablet Commonly known as: LASIX Take 1 tablet (40 mg total) by mouth 2 (two) times daily.   Incruse Ellipta 62.5 MCG/INH Aepb Generic drug: umeclidinium bromide TAKE 1 PUFF BY MOUTH EVERY DAY   leflunomide 20 MG tablet Commonly known as: ARAVA TAKE 1 TABLET BY MOUTH EVERY DAY   omeprazole 40 MG capsule Commonly known as: PRILOSEC Take 40 mg by mouth at bedtime.   Potassium Chloride ER 20 MEQ Tbcr Take 20 mEq by mouth daily.   predniSONE 10 MG (21) Tbpk tablet Commonly known as: STERAPRED UNI-PAK 21 TAB Use per package instruction   Probiotic Daily Caps Take 1 capsule by mouth daily.   warfarin 3 MG tablet Commonly known as: COUMADIN Take 3 mg by mouth at bedtime.         Diet and Activity recommendation: See Discharge Instructions above   Consults obtained -  None   Major procedures and Radiology Reports - PLEASE  review detailed and final reports for all details, in brief -      Dg Chest Port 1 View  Result Date: 11/21/2019 CLINICAL DATA:  COVID-19 pneumonia, wheezing EXAM: PORTABLE CHEST 1 VIEW COMPARISON:  11/16/2019 FINDINGS: Stable cardiomediastinal contours. Calcific aortic knob. Chronic elevation of the left  hemidiaphragm. Hazy opacity in the periphery of the right upper lobe, not significantly changed from prior. No new focal airspace consolidation. No pleural effusion or pneumothorax. IMPRESSION: No significant change in the appearance hazy right upper lobe airspace opacity. Electronically Signed   By: Davina Poke M.D.   On: 11/21/2019 15:38   Dg Chest Port 1 View  Result Date: 11/16/2019 CLINICAL DATA:  Shortness of breath, cold symptoms for a week, productive cough, fatigue, intermittent chest pain, decreased oxygen saturation, worsening shortness of breath this morning, history prostate cancer, former smoker, asthma EXAM: PORTABLE CHEST 1 VIEW COMPARISON:  Portable exam 0929 hours compared to 06/07/2019 FINDINGS: Chronic elevation LEFT diaphragm. Upper normal heart size with slight vascular congestion. Mediastinal contours normal. Atherosclerotic calcification aorta. LEFT basilar atelectasis. Mild infiltrate RIGHT upper lobe question pneumonia. No pleural effusion or pneumothorax. IMPRESSION: Suspected RIGHT upper lobe pneumonia. Chronic elevation of LEFT diaphragm with LEFT basilar atelectasis. Electronically Signed   By: Lavonia Dana M.D.   On: 11/16/2019 09:55    Micro Results     Recent Results (from the past 240 hour(s))  SARS CORONAVIRUS 2 (TAT 6-24 HRS) Nasopharyngeal Nasopharyngeal Swab     Status: Abnormal   Collection Time: 11/16/19  9:09 AM   Specimen: Nasopharyngeal Swab  Result Value Ref Range Status   SARS Coronavirus 2 POSITIVE (A) NEGATIVE Final    Comment: RESULT CALLED TO, READ BACK BY AND VERIFIED WITH: B JOHNSON,RN 2019 11/16/2019 D BRADLEY (NOTE) SARS-CoV-2 target nucleic acids are DETECTED. The SARS-CoV-2 RNA is generally detectable in upper and lower respiratory specimens during the acute phase of infection. Positive results are indicative of active infection with SARS-CoV-2. Clinical  correlation with patient history and other diagnostic information is necessary  to determine patient infection status. Positive results do  not rule out bacterial infection or co-infection with other viruses. The expected result is Negative. Fact Sheet for Patients: SugarRoll.be Fact Sheet for Healthcare Providers: https://www.woods-mathews.com/ This test is not yet approved or cleared by the [place] FDA and  has been authorized for detection and/or diagnosis of SARS-CoV-2 by FDA under an Emergency Use Authorization (EUA). This EUA will remain  in effect (meaning this test can be used) fo r the duration of the COVID-19 declaration under Section 564(b)(1) of the Act, 21 U.S.C. section 360bbb-3(b)(1), unless the authorization is terminated or revoked sooner. Performed at [facility], [place] [address] [place], [place] [address]   Blood Culture (routine x 2)     Status: None   Collection Time: 11/16/19 10:01 AM   Specimen: BLOOD RIGHT HAND  Result Value Ref Range Status   Specimen Description BLOOD RIGHT HAND  Final   Special Requests   Final    BOTTLES DRAWN AEROBIC AND ANAEROBIC Blood Culture results may not be optimal due to an inadequate volume of blood received in culture bottles   Culture   Final    NO GROWTH 6 DAYS Performed at [facility], [address] [place], [place] [address]    Report Status 11/22/2019 FINAL  Final  Blood Culture (routine x 2)     Status: None   Collection Time: 11/16/19 10:02 AM   Specimen: BLOOD  LEFT ARM  Result Value Ref Range Status   Specimen Description BLOOD LEFT ARM  Final   Special Requests   Final    BOTTLES DRAWN AEROBIC AND ANAEROBIC Blood Culture results may not be optimal due to an inadequate volume of blood received in culture bottles   Culture   Final    NO GROWTH 6 DAYS Performed at [facility], [address] [place], [place] [address]    Report Status 11/22/2019 FINAL  Final       Today   Subjective:   [name] today has no headache,no  chest or abdominal pain,no new weakness tingling or numbness, feels much better wants to go home today.  Reports dyspnea has resolved, report cough still present, but significantly improved and less productive.  Objective:   Blood pressure 132/90, pulse 83, temperature 98 F (36.7 C), temperature source Oral, resp. rate 19, height 5\' 8"  (1.727 m), weight 97.7 kg, SpO2 93 %.   Intake/Output Summary (Last 24 hours) at 11/23/2019 1124 Last data filed at 11/23/2019 0954 Gross per 24 hour  Intake 1890 ml  Output 2050 ml  Net -160 ml    Exam Awake Alert, Oriented x 3, No new F.N deficits, Normal affect Symmetrical Chest wall movement, Good air movement bilaterally, no wheezing today RRR,No Gallops,Rubs or new Murmurs, No Parasternal Heave +ve B.Sounds, Abd Soft, Non tender, No organomegaly appriciated, No rebound -guarding or rigidity. No Cyanosis, Clubbing or edema, No new Rash or bruise  Data Review   CBC w Diff:  Lab Results  Component Value Date   WBC 11.1 (H) 11/22/2019   HGB 14.2 11/22/2019   HGB 14.7 11/25/2016   HCT 42.6 11/22/2019   HCT 43.4 11/25/2016   PLT 181 11/22/2019   PLT 213 11/25/2016   LYMPHOPCT 20 11/16/2019   MONOPCT 16 11/16/2019   EOSPCT 1 11/16/2019   BASOPCT 1 11/16/2019    CMP:  Lab Results  Component Value Date   NA 138 11/22/2019   NA 144 11/25/2016   K 3.7 11/22/2019   CL 102 11/22/2019   CO2 26 11/22/2019   BUN 32 (H) 11/22/2019   BUN 13 11/25/2016   CREATININE 1.10 11/22/2019   CREATININE 1.24 03/18/2018   PROT 5.7 (L) 11/22/2019   PROT 6.6 11/25/2016   ALBUMIN 3.3 (L) 11/22/2019   ALBUMIN 3.8 11/25/2016   BILITOT 0.9 11/22/2019   BILITOT 0.4 11/25/2016   ALKPHOS 53 11/22/2019   AST 34 11/22/2019   ALT 49 (H) 11/22/2019  .   Total Time in preparing paper work, data evaluation and todays exam - 1 minutes  Phillips Climes M.D on 11/23/2019 at 11:24 AM  [facility]   Office  [phone]

## 2019-11-23 NOTE — Plan of Care (Signed)
  Problem: Clinical Measurements: Goal: Ability to maintain clinical measurements within normal limits will improve Outcome: Progressing   Problem: Clinical Measurements: Goal: Cardiovascular complication will be avoided Outcome: Progressing   

## 2019-11-23 NOTE — Care Management Important Message (Signed)
Important Message  Patient Details  Name: [name] MRN: [id number] Date of Birth: Jul 26, 1951   Medicare Important Message Given:  Yes - Important Message mailed due to current National Emergency  Verbal consent obtained due to current National Emergency  Relationship to patient: Spouse/Significant Other Contact Name: Paydon Kolling Call Date: 11/23/19  Time: 85 Phone: [phone] Outcome: No Answer/Busy Important Message mailed to: Patient address on file    Delorse Lek 11/23/2019, 12:50 PM

## 2019-11-24 DIAGNOSIS — U071 COVID-19: Secondary | ICD-10-CM | POA: Diagnosis not present

## 2019-11-24 DIAGNOSIS — Z681 Body mass index (BMI) 19 or less, adult: Secondary | ICD-10-CM | POA: Diagnosis not present

## 2019-11-24 DIAGNOSIS — R0902 Hypoxemia: Secondary | ICD-10-CM | POA: Diagnosis not present

## 2019-11-30 ENCOUNTER — Telehealth: Payer: Self-pay | Admitting: Rheumatology

## 2019-11-30 NOTE — Telephone Encounter (Signed)
A representative from [employer] called to let you know they received request for benefits for date of service 01/03/2020, but unable to process benefits for the new year until 01/07/2020. Please call back then for verification.

## 2019-12-07 DIAGNOSIS — E6609 Other obesity due to excess calories: Secondary | ICD-10-CM | POA: Diagnosis not present

## 2019-12-07 DIAGNOSIS — Z6833 Body mass index (BMI) 33.0-33.9, adult: Secondary | ICD-10-CM | POA: Diagnosis not present

## 2019-12-07 DIAGNOSIS — U071 COVID-19: Secondary | ICD-10-CM | POA: Diagnosis not present

## 2019-12-07 DIAGNOSIS — J441 Chronic obstructive pulmonary disease with (acute) exacerbation: Secondary | ICD-10-CM | POA: Diagnosis not present

## 2019-12-20 ENCOUNTER — Other Ambulatory Visit: Payer: Self-pay | Admitting: Rheumatology

## 2019-12-20 NOTE — Telephone Encounter (Signed)
ALT was elevated but trending down and AST was WNL.   Ok to refill Arava.

## 2019-12-20 NOTE — Telephone Encounter (Signed)
Last Visit: 10/11/2019 Next Visit: 03/07/2020 Labs: 11/22/2019 stable, ALT 49, WBC 11.1,   Okay to refill arava?

## 2020-01-05 ENCOUNTER — Other Ambulatory Visit: Payer: Self-pay | Admitting: Rheumatology

## 2020-01-05 NOTE — Telephone Encounter (Signed)
Last Visit: 10/11/2019 Next Visit: 03/07/2020   Okay to refill per Dr. Estanislado Pandy.

## 2020-01-06 NOTE — Telephone Encounter (Signed)
Received fax from [facility], patient is still active with his Medicare and Medicare supplement. No precertification is required.  12:20 PM Beatriz Chancellor, CPhT

## 2020-01-12 ENCOUNTER — Other Ambulatory Visit: Payer: Self-pay | Admitting: *Deleted

## 2020-01-12 ENCOUNTER — Ambulatory Visit (HOSPITAL_COMMUNITY)
Admission: RE | Admit: 2020-01-12 | Discharge: 2020-01-12 | Disposition: A | Discharge status: Home / Self Care | Payer: Medicare Other | Source: Ambulatory Visit | Attending: Rheumatology | Admitting: Rheumatology

## 2020-01-12 ENCOUNTER — Other Ambulatory Visit: Payer: Self-pay

## 2020-01-12 DIAGNOSIS — M0579 Rheumatoid arthritis with rheumatoid factor of multiple sites without organ or systems involvement: Secondary | ICD-10-CM | POA: Diagnosis not present

## 2020-01-12 LAB — COMPREHENSIVE METABOLIC PANEL
ALT: 21 U/L (ref 0–44)
AST: 34 U/L (ref 15–41)
Albumin: 3.4 g/dL — ABNORMAL LOW (ref 3.5–5.0)
Alkaline Phosphatase: 96 U/L (ref 38–126)
Anion gap: 11 (ref 5–15)
BUN: 14 mg/dL (ref 8–23)
CO2: 29 mmol/L (ref 22–32)
Calcium: 9.3 mg/dL (ref 8.9–10.3)
Chloride: 100 mmol/L (ref 98–111)
Creatinine, Ser: 1.24 mg/dL (ref 0.61–1.24)
GFR calc Af Amer: >60 mL/min (ref >60)
GFR calc non Af Amer: 59 mL/min — ABNORMAL LOW (ref >60)
Glucose, Bld: 100 mg/dL — ABNORMAL HIGH (ref 70–99)
Potassium: 4.2 mmol/L (ref 3.5–5.1)
Sodium: 140 mmol/L (ref 135–145)
Total Bilirubin: 1 mg/dL (ref 0.3–1.2)
Total Protein: 6.9 g/dL (ref 6.5–8.1)

## 2020-01-12 LAB — CBC
HCT: 45.6 % (ref 39.0–52.0)
Hemoglobin: 14.9 g/dL (ref 13.0–17.0)
MCH: 30.4 pg (ref 26.0–34.0)
MCHC: 32.7 g/dL (ref 30.0–36.0)
MCV: 93.1 fL (ref 80.0–100.0)
Platelets: 233 10*3/uL (ref 150–400)
RBC: 4.9 MIL/uL (ref 4.22–5.81)
RDW: 13.5 % (ref 11.5–15.5)
WBC: 9.4 10*3/uL (ref 4.0–10.5)
nRBC: 0 % (ref 0.0–0.2)

## 2020-01-12 MED ORDER — ACETAMINOPHEN 325 MG PO TABS: 650.0000 mg | ORAL_TABLET | ORAL | Status: DC

## 2020-01-12 MED ORDER — TOCILIZUMAB 400 MG/20ML IV SOLN
4.0000 mg/kg | INTRAVENOUS | Status: DC
  Administered 2020-01-12: 400 mg via INTRAVENOUS
  Filled 2020-01-12: qty 20

## 2020-01-12 MED ORDER — DIPHENHYDRAMINE HCL 25 MG PO CAPS: 25.0000 mg | ORAL_CAPSULE | ORAL | Status: DC

## 2020-01-12 NOTE — Progress Notes (Signed)
CBC normal

## 2020-01-14 ENCOUNTER — Telehealth: Payer: Self-pay | Admitting: Rheumatology

## 2020-01-14 NOTE — Telephone Encounter (Signed)
Patient calling because he is allergic to a lot of medication, and would like to know if that makes a difference with which COVID vaccine he can get? Please call to advise.

## 2020-01-17 ENCOUNTER — Telehealth: Payer: Self-pay | Admitting: Rheumatology

## 2020-01-17 NOTE — Telephone Encounter (Signed)
Patient left a voicemail requesting a return call to let him know "if there is one Coronavirus vaccine that is better than the other."

## 2020-01-17 NOTE — Telephone Encounter (Signed)
Patient advised All the Covid vaccines are same.  It does not matter which Covid vaccine to pick. Patient advised when he goes to get vaccine to advise them of his allergies and the should advise him about precautions.

## 2020-01-17 NOTE — Telephone Encounter (Signed)
All the Covid vaccines are same.  It does not matter which Covid vaccine to pick.

## 2020-01-19 ENCOUNTER — Ambulatory Visit: Payer: Medicare Other | Admitting: Urology

## 2020-01-20 DIAGNOSIS — I82409 Acute embolism and thrombosis of unspecified deep veins of unspecified lower extremity: Secondary | ICD-10-CM | POA: Diagnosis not present

## 2020-01-21 DIAGNOSIS — Z23 Encounter for immunization: Secondary | ICD-10-CM | POA: Diagnosis not present

## 2020-02-01 ENCOUNTER — Other Ambulatory Visit: Payer: Self-pay | Admitting: *Deleted

## 2020-02-01 NOTE — Progress Notes (Signed)
Infusion orders are current for patient CBC CMP Tylenol Benadryl appointments are up to date and follow up appointment  is scheduled TB gold due and order placed.  

## 2020-02-09 ENCOUNTER — Other Ambulatory Visit: Payer: Self-pay

## 2020-02-09 ENCOUNTER — Ambulatory Visit (HOSPITAL_COMMUNITY)
Admission: RE | Admit: 2020-02-09 | Discharge: 2020-02-09 | Disposition: A | Discharge status: Home / Self Care | Payer: Medicare Other | Source: Ambulatory Visit | Attending: Rheumatology | Admitting: Rheumatology

## 2020-02-09 DIAGNOSIS — M0579 Rheumatoid arthritis with rheumatoid factor of multiple sites without organ or systems involvement: Secondary | ICD-10-CM | POA: Diagnosis not present

## 2020-02-09 MED ORDER — ACETAMINOPHEN 325 MG PO TABS: 650.0000 mg | ORAL_TABLET | ORAL | Status: DC

## 2020-02-09 MED ORDER — TOCILIZUMAB 400 MG/20ML IV SOLN
4.0000 mg/kg | INTRAVENOUS | Status: DC
  Administered 2020-02-09: 400 mg via INTRAVENOUS
  Filled 2020-02-09: qty 20

## 2020-02-09 MED ORDER — DIPHENHYDRAMINE HCL 25 MG PO CAPS: 25.0000 mg | ORAL_CAPSULE | ORAL | Status: DC

## 2020-02-11 ENCOUNTER — Other Ambulatory Visit: Payer: Self-pay

## 2020-02-11 DIAGNOSIS — Z111 Encounter for screening for respiratory tuberculosis: Secondary | ICD-10-CM

## 2020-02-11 DIAGNOSIS — Z79899 Other long term (current) drug therapy: Secondary | ICD-10-CM

## 2020-02-11 LAB — QUANTIFERON-TB GOLD PLUS

## 2020-02-16 ENCOUNTER — Other Ambulatory Visit: Payer: Self-pay

## 2020-02-16 DIAGNOSIS — Z111 Encounter for screening for respiratory tuberculosis: Secondary | ICD-10-CM | POA: Diagnosis not present

## 2020-02-16 DIAGNOSIS — Z8546 Personal history of malignant neoplasm of prostate: Secondary | ICD-10-CM | POA: Diagnosis not present

## 2020-02-16 DIAGNOSIS — Z79899 Other long term (current) drug therapy: Secondary | ICD-10-CM | POA: Diagnosis not present

## 2020-02-17 LAB — PSA: PSA: <0.1 ng/mL (ref <=4.0)

## 2020-02-18 DIAGNOSIS — Z23 Encounter for immunization: Secondary | ICD-10-CM | POA: Diagnosis not present

## 2020-02-18 LAB — QUANTIFERON-TB GOLD PLUS
Mitogen-NIL: >10 IU/mL
NIL: 0.82 IU/mL
QuantiFERON-TB Gold Plus: NEGATIVE
TB1-NIL: <0 IU/mL
TB2-NIL: 0.03 IU/mL

## 2020-02-23 ENCOUNTER — Encounter: Payer: Self-pay | Admitting: Urology

## 2020-02-23 ENCOUNTER — Other Ambulatory Visit: Payer: Self-pay

## 2020-02-23 ENCOUNTER — Ambulatory Visit (INDEPENDENT_AMBULATORY_CARE_PROVIDER_SITE_OTHER): Payer: Medicare Other | Admitting: Urology

## 2020-02-23 VITALS — BP 143/76 | HR 72 | Temp 98.1°F | Ht 69.0 in | Wt 220.0 lb

## 2020-02-23 DIAGNOSIS — C61 Malignant neoplasm of prostate: Secondary | ICD-10-CM | POA: Diagnosis not present

## 2020-02-23 DIAGNOSIS — N3281 Overactive bladder: Secondary | ICD-10-CM | POA: Diagnosis not present

## 2020-02-23 NOTE — Patient Instructions (Signed)

## 2020-02-23 NOTE — Progress Notes (Signed)
Urological Symptom Review  Patient is experiencing the following symptoms: Hard to postpone urination Burning/pain with urination Get up at night to urinate Leakage of urine   Review of Systems  Gastrointestinal (upper)  : Negative for upper GI symptoms  Gastrointestinal (lower) : Negative for lower GI symptoms  Constitutional : Negative for symptoms  Skin: Negative for skin symptoms  Eyes: Negative for eye symptoms  Ear/Nose/Throat : Negative for Ear/Nose/Throat symptoms  Hematologic/Lymphatic: Negative for Hematologic/Lymphatic symptoms  Cardiovascular : Negative for cardiovascular symptoms  Respiratory : Shortness of breath  Endocrine: Negative for endocrine symptoms  Musculoskeletal: Negative for musculoskeletal symptoms  Neurological: Negative for neurological symptoms  Psychologic: Negative for psychiatric symptoms

## 2020-02-23 NOTE — Progress Notes (Signed)
02/23/2020 11:49 AM   [name] [birth year] MF:[id number]  Referring provider: [name], MD [address] [employer] [facility] [place],  [place] [address]  Urinary urgency  HPI: Mr Kapsner is a [phone] here for followup for prostate cancer and OAB. PSA remains undetectable. No worsening LUTS. He has stable urinary urgency and OAB off OAB meds. Mirabegron and Lisbeth Ply failed to improve his urgency and urge incontinence.  His records from [facility] are as follows: Prostate Cancer 10/02/2016: Pt s/p RRP PSA was 0.03 after surgery and 1 year later is now 0.1. He does not leak urine.  02/12/2017: He is s/p salvage radiation for a PSA recurrance. PSA is now undetectable. He has urgency and daily urge incontinence. His urgency started during the radiation.  05/28/2017: PSA is 0.1 from undetectable. He has occasional urgency and took mirabegron which did not improve his LUTS.  09/03/2017: PSA is undetectable. He continue to have urgency and occasional urge incontinence.  12/03/2017: PSA undetectable. Urgency has improved with vesicare  03/11/2018: PSA remain undetectable  06/17/2018: PSA remains undetectable  12/16/2018: PSA undetectable. No new LUTS.  06/16/2019: PSA remains undetectable.  OAB: 04/19/2019: Since the surgery he has noted worsening urgency and urge incontinence. He is using 2-4 pads per day  05/25/2019: He tried vesicare 5mg  daily which failed to improve his urge incontinence. He takes lasix at 6pm.  06/16/2019: He is limiting his fluid intake which has improved his incontinence. He stopped toviaz 8mg  when he was admitted to the hospital with fluid retention       PMH: Past Medical History:  Diagnosis Date  . Anxiety    hx of   . Arthritis    RA  . Asthma   . CHF (congestive heart failure) ([place])   . Clotting disorder (H[facility])    DVT both legs   . COPD (chronic obstructive pulmonary disease) ([place])   . DDD (degenerative disc disease), cervical    with UE's paresthesias  . Diverticulosis   .  DVT, lower extremity ([place])    bilat  . 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 GERD (gastroesophageal reflux disease)   . Gout   . Heart murmur   . History of measles   . History of shingles   . Hypercholesterolemia   . Hypertension   . IBS (irritable bowel syndrome)   . IBS (irritable bowel syndrome)   . Peripheral edema   . Pneumonia    hx of   . Prostate cancer ([place])   . Seizures ([place])    last seizure 20 years ago; on dilantin. Unknown origin.  [name] Shortness of breath dyspnea    exertion   . Sleep apnea    not on cpap  . Tubular adenoma of colon 04/2008    Surgical History: Past Surgical History:  Procedure Laterality Date  . CHOLECYSTECTOMY    . COLONOSCOPY    . CYSTOSCOPY WITH LITHOLAPAXY N/A 03/17/2019   Procedure: CYSTOSCOPY WITH LITHOLAPAXY AND REMOVAL OF FOREIGN BODY;  Surgeon: [name], MD;  Location: [facility];  Service: Urology;  Laterality: N/A;  . DOPPLER ECHOCARDIOGRAPHY N/A 01-21-2012   TECHNICALLY DIFFICULT. MILD CONCENTRIC LV HYPERTROPHY. LV CAVITY IS SMALL.. EF=> 55%. TRANSMITRAL SPECTRAL FLOW PATTREN IS SUGGESTIVE OF IMPAIRED LV RELAXATION. RV SYSTOLIC PRESSURE IS [id number]. LEFT ATRIAL SIZE IS NORMAL. AV APPEARS MILDLY SCLEROTIC. NO SIGN VALVE DISEASE NOTED.  [name] LYMPHADENECTOMY Bilateral 08/30/2015   Procedure: PELVIC LYMPHADENECTOMY;  Surgeon: [name], MD;  Location: [facility];  Service: Urology;  Laterality: Bilateral;  . NUCLEAR STRESS TEST N/A 01-21-2012   NORMAL PATTERN OF PERFUSION IN ALL REGIONS. POST STRESS LV SIZE IS NORMAL. NO EVIDENCE OF INDUCIBLE ISCHEMIA. EF 54%.  [name] POLYPECTOMY    . PROSTATE BIOPSY    . ROBOT ASSISTED LAPAROSCOPIC RADICAL PROSTATECTOMY N/A 08/30/2015   Procedure: ROBOTIC ASSISTED LAPAROSCOPIC RADICAL PROSTATECTOMY;  Surgeon: [name], MD;  Location: [facility];  Service: Urology;  Laterality: N/A;  . US VENOUS LOWER EXT Right 03/07/11   PERSISTENT DVT IN  RIGHT LOWER EXT.WITH PERSISTANT VISUALIZATION OF HYPOECHOIC THROMBUS WITHIN THE FEMORAL, PROFUNDA FEMORAL AND POPLITEAL VEINS. WHEN COMPARED TO PREVIOUS, CLOT IS NO LONGER IDENTIFIED WITH IN THE RIGHT CFV.    Home Medications:  Allergies as of 02/23/2020      Reactions   Orencia [abatacept] Anaphylaxis   Had prostate cancer   Carbamazepine Rash   REACTION: makes drowsy BRAND NAME IS TEGRETOL   Celecoxib Itching, Rash   BRAND NAME IS CELEBREX   Cephalexin Rash   "Severe Rash".  BRAND NAME IS KEFLEX.    Enbrel [etanercept] Rash   Humira [adalimumab] Rash   Levofloxacin Other (See Comments)   REACTION: GI Intolerance BRAND NAME IS LEVAQUIN   Sulfa Antibiotics Rash   Sulfasalazine Rash      Medication List       Accurate as of February 23, 2020 11:49 AM. If you have any questions, ask your nurse or doctor.        STOP taking these medications   azithromycin 250 MG tablet Commonly known as: ZITHROMAX Stopped by: Nicolette Bang, MD     TAKE these medications   acetaminophen 325 MG tablet Commonly known as: TYLENOL Take 2 tablets (650 mg total) by mouth every 6 (six) hours as needed for mild pain, fever or headache.   ACTEMRA IV Inject into the vein every 28 (twenty-eight) days. For Rheumatoid Arthritis   albuterol (2.5 MG/3ML) 0.083% nebulizer solution Commonly known as: PROVENTIL Take 3 mLs (2.5 mg total) by nebulization every 6 (six) hours as needed for wheezing or shortness of breath.   alendronate 70 MG tablet Commonly known as: FOSAMAX TAKE 1 TABLET BY MOUTH ONCE A WEEK. TAKE WITH FULL GLASS OF WATER ON EMPTY STOMACH   allopurinol 100 MG tablet Commonly known as: ZYLOPRIM TAKE 1 TABLET BY MOUTH TWICE A DAY   benzonatate 200 MG capsule Commonly known as: TESSALON Take 200 mg by mouth 3 (three) times daily as needed.   Breo Ellipta 100-25 MCG/INH Aepb Generic drug: fluticasone furoate-vilanterol USE 1 INHALATION ORALLY    DAILY     chlorpheniramine-HYDROcodone 10-8 MG/5ML Suer Commonly known as: TUSSIONEX SMARTSIG:5 Milliliter(s) By Mouth Every 12 Hours PRN   CITRACAL +D3 PO Take 2 tablets by mouth 2 (two) times daily.   diclofenac sodium 1 % Gel Commonly known as: VOLTAREN Apply 3 grams to 3 large joints up to 3 times daily What changed:   how much to take  how to take this  when to take this  reasons to take this   dicyclomine 10 MG capsule Commonly known as: BENTYL TAKE 1 CAPSULE BY MOUTH 4 TIMES DAILY BEFORE MEALS AND AT BEDTIME What changed: See the new instructions.   Dilantin 100 MG ER capsule Generic drug: phenytoin Take 100-200 mg by mouth See admin instructions. Take one capsule in the morning and 2 capsules at bedtime   folic acid 1 MG  tablet Commonly known as: FOLVITE TAKE 2 TABLETS BY MOUTH EVERY MORNING   furosemide 40 MG tablet Commonly known as: LASIX Take 1 tablet (40 mg total) by mouth 2 (two) times daily.   Incruse Ellipta 62.5 MCG/INH Aepb Generic drug: umeclidinium bromide TAKE 1 PUFF BY MOUTH EVERY DAY   leflunomide 20 MG tablet Commonly known as: ARAVA TAKE 1 TABLET BY MOUTH EVERY DAY   omeprazole 40 MG capsule Commonly known as: PRILOSEC Take 40 mg by mouth at bedtime.   Potassium Chloride ER 20 MEQ Tbcr Take 20 mEq by mouth daily.   predniSONE 10 MG (21) Tbpk tablet Commonly known as: STERAPRED UNI-PAK 21 TAB Use per package instruction   Probiotic Daily Caps Take 1 capsule by mouth daily.   warfarin 3 MG tablet Commonly known as: COUMADIN Take 3 mg by mouth at bedtime.       Allergies:  Allergies  Allergen Reactions  . Orencia [Abatacept] Anaphylaxis    Had prostate cancer  . Carbamazepine Rash    REACTION: makes drowsy BRAND NAME IS TEGRETOL  . Celecoxib Itching and Rash    BRAND NAME IS CELEBREX  . Cephalexin Rash    "Severe Rash".  BRAND NAME IS KEFLEX.   . Enbrel [Etanercept] Rash  . Humira [Adalimumab] Rash  . Levofloxacin Other  (See Comments)    REACTION: GI Intolerance BRAND NAME IS LEVAQUIN  . Sulfa Antibiotics Rash  . Sulfasalazine Rash    Family History: Family History  Problem Relation Age of Onset  . Skin cancer Father   . Stomach cancer Father   . Heart attack Father   . Alzheimer's disease Mother   . Throat cancer Brother   . Stomach cancer Brother   . Lung cancer Brother   . Heart attack Brother   . Heart attack Brother   . Breast cancer Sister   . Heart attack Sister   . Stroke Sister   . Bladder Cancer Sister   . Heart murmur Sister   . Colon cancer Brother   . Colon polyps Neg Hx     Social History:  reports that he quit smoking about 23 years ago. His smoking use included cigarettes. He has a 60.00 pack-year smoking history. He has never used smokeless tobacco. He reports that he does not drink alcohol or use drugs.  ROS: All other review of systems were reviewed and are negative except what is noted above in HPI  Physical Exam: BP (!) 143/76   Pulse 72   Temp 98.1 F (36.7 C)   Ht 5\' 9"  (1.753 m)   Wt 220 lb (99.8 kg)   BMI 32.49 kg/m   Constitutional:  Alert and oriented, No acute distress. HEENT: [place] AT, moist mucus membranes.  Trachea midline, no masses. Cardiovascular: No clubbing, cyanosis, or edema. Respiratory: Normal respiratory effort, no increased work of breathing. GI: Abdomen is soft, nontender, nondistended, no abdominal masses GU: No CVA tenderness Lymph: No cervical or inguinal lymphadenopathy. Skin: No rashes, bruises or suspicious lesions. Neurologic: Grossly intact, no focal deficits, moving all 4 extremities. Psychiatric: Normal mood and affect.  Laboratory Data: Lab Results  Component Value Date   WBC 9.4 01/12/2020   HGB 14.9 01/12/2020   HCT 45.6 01/12/2020   MCV 93.1 01/12/2020   PLT 233 01/12/2020    Lab Results  Component Value Date   CREATININE 1.24 01/12/2020    Lab Results  Component Value Date   PSA <0.1 02/16/2020    No  results found for: TESTOSTERONE  No results found for: HGBA1C  Urinalysis    Component Value Date/Time   COLORURINE YELLOW 11/29/2018 1033   APPEARANCEUR HAZY (A) 11/29/2018 1033   LABSPEC 1.025 11/29/2018 1033   PHURINE 5.0 11/29/2018 1033   GLUCOSEU NEGATIVE 11/29/2018 1033   HGBUR MODERATE (A) 11/29/2018 1033   BILIRUBINUR NEGATIVE 11/29/2018 1033   KETONESUR NEGATIVE 11/29/2018 1033   PROTEINUR 30 (A) 11/29/2018 1033   UROBILINOGEN 0.2 09/24/2012 1640   NITRITE NEGATIVE 11/29/2018 1033   LEUKOCYTESUR NEGATIVE 11/29/2018 1033    Lab Results  Component Value Date   BACTERIA NONE SEEN 11/29/2018    Pertinent Imaging:  Results for orders placed during the hospital encounter of 05/27/13  DG Abd 1 View   Narrative *RADIOLOGY REPORT*  Clinical Data: The mid to lower abdominal pain for 1 month with some nausea and constipation  ABDOMEN - 1 VIEW  Comparison: 03/15/2011, abdomen CT  Findings: The bowel gas pattern is within normal limits.  There is no significant increased stool.  There are changes from a prior cholecystectomy.  The soft tissues are otherwise unremarkable.  No significant bony abnormality.  IMPRESSION: No acute findings.  No obstruction.  No significant increase in stool.   Original Report Authenticated By: Lajean Manes, M.D.    Results for orders placed during the hospital encounter of 09/03/07  US VenoUS Imaging Bilateral   Narrative Clinical Data: Bilateral lower extremity edema.  History of DVT.  Increased d-dimer. BILATERAL LOWER EXTREMITY VENOUS DOPPLER ULTRASOUND: Technique:  Gray-scale sonography with compression, as well as color and duplex Doppler ultrasound, were performed to evaluate the deep venous system from the level of the common femoral vein through the popliteal and proximal calf veins. Findings:  There is no evidence of acute DVT.  Normal compressibility phasicity and augmentation of the deep veins bilaterally.  There is some  residual thrombus in the left popliteal and left posterior tibial veins.  Patient had findings of an acute DVT in the left popliteal and posterior tibial veins as noted on the 04/29/05 exam.   IMPRESSION:  Negative for acute DVT involving the lower extremities.  Provider: Mertie Clause   No results found for this or any previous visit. No results found for this or any previous visit. No results found for this or any previous visit. No results found for this or any previous visit. No results found for this or any previous visit. No results found for this or any previous visit.  Assessment & Plan:    1. Prostate cancer ([place]) -RTC 6 months with PSA  2. OAB (overactive bladder) -We discussed medical therapy, PTNS, intravesical botox, and interstim. The patient wishes to proceed with PTNS   No follow-ups on file.  Nicolette Bang, MD  [facility] [place]

## 2020-02-28 NOTE — Progress Notes (Signed)
Office Visit Note  Patient: [name]             Date of Birth: [birth year]           MRN: [id number]             PCP: [name], MD Referring: [name], MD Visit Date: 03/02/2020 Occupation: @GUAROCC @  Subjective:  Medication monitoring   History of Present Illness: [name] is a 69 y.o. male with history of seropositive rheumatoid arthritis, osteoarthritis, gout, and osteoporosis.  Patient is on Actemra IV infusions every 28 days and Arava 20 mg 1 tablet by mouth daily.  He has not missed any infusions or doses of Arava recently.  He has an infusion scheduled next week.  He denies any recent rheumatoid arthritis flares.  He experiences occasional stiffness in both hands but denies any joint swelling at this time.  He uses Voltaren gel topically as needed which helps with his stiffness.  If he is experiencing severe pain he will take Tylenol arthritis as needed.  He denies any knee joint pain or pain in his feet at this time.  He denies any joint swelling. He denies any recent gout flares.  He is taking allopurinol 200 mg by mouth daily. He continues to take Fosamax 70 mg 1 tablet by mouth once weekly.  Activities of Daily Living:  Patient reports morning stiffness for 0 minutes.   Patient Denies nocturnal pain.  Difficulty dressing/grooming: Denies Difficulty climbing stairs: Denies Difficulty getting out of chair: Denies Difficulty using hands for taps, buttons, cutlery, and/or writing: Denies  Review of Systems  Constitutional: Negative for fatigue and night sweats.  HENT: Negative for mouth sores, mouth dryness and nose dryness.   Eyes: Negative for redness, itching and dryness.  Respiratory: Positive for shortness of breath. Negative for cough, hemoptysis and difficulty breathing.        Due to COPD   Cardiovascular: Negative for chest pain, palpitations, hypertension, irregular heartbeat and swelling in legs/feet.  Gastrointestinal: Positive for blood  in stool. Negative for constipation and diarrhea.       Follows up with GI  Endocrine: Negative for increased urination.  Genitourinary: Negative for difficulty urinating and painful urination.  Musculoskeletal: Positive for arthralgias and joint pain. Negative for joint swelling, myalgias, muscle weakness, morning stiffness, muscle tenderness and myalgias.  Skin: Negative for color change, rash, hair loss, nodules/bumps, skin tightness, ulcers and sensitivity to sunlight.  Allergic/Immunologic: Negative for susceptible to infections.  Neurological: Negative for dizziness, fainting, headaches, memory loss, night sweats and weakness.  Hematological: Positive for bruising/bleeding tendency. Negative for swollen glands.  Psychiatric/Behavioral: Negative for depressed mood, confusion and sleep disturbance. The patient is not nervous/anxious.     PMFS History:  Patient Active Problem List   Diagnosis Date Noted  . OAB (overactive bladder) 02/23/2020  . Acute hypoxemic respiratory failure due to COVID-19 ([place]) 11/17/2019  . CAP (community acquired pneumonia) 11/16/2019  . Acute respiratory failure with hypoxia ([place]) 11/16/2019  . Pneumonia due to COVID-19 virus 11/16/2019  . Class 2 severe obesity due to excess calories with serious comorbidity and body mass index (BMI) of 35.0 to 35.9 in adult [facility])   . Chronic diastolic HF (heart failure) ([place])   . Serratia sepsis ([place]) 06/08/2019  . Bacteremia due to Gram-negative bacteria 06/08/2019  . Sepsis due to cellulitis ([place]) 06/07/2019  . Voice hoarseness 04/26/2019  . OSA (obstructive sleep apnea) 03/10/2019  . Abnormal CT of the chest 02/09/2019  .  Dyspnea on exertion 10/22/2017  . History of seizure disorder 02/27/2017  . Primary osteoarthritis of both hands 02/27/2017  . Primary osteoarthritis of both feet 02/27/2017  . Idiopathic gout of multiple sites 02/27/2017  . High risk medication use 12/29/2016  . History of prostate cancer  12/29/2016  . Primary osteoarthritis of both knees 12/29/2016  . Chronic idiopathic gout involving toe without tophus 12/29/2016  . History of CHF (congestive heart failure) 12/29/2016  . History of COPD 12/29/2016  . Plantar pustular psoriasis 12/29/2016  . DVT (deep venous thrombosis) ([place]) 10/31/2016  . COPD with acute exacerbation ([place]) 10/31/2016  . CHF (congestive heart failure) ([place]) 10/31/2016  . Prostate cancer ([place]) 08/30/2015  . Malignant neoplasm of prostate ([place]) 07/04/2015  . Chest pain at rest 07/05/2014  . Weakness 07/05/2014  . Complicated postphlebitic syndrome 11/16/2013  . Personal history of DVT (deep vein thrombosis) 05/18/2013  . Chronic anticoagulation 05/18/2013  . Diverticulosis of colon without hemorrhage 05/18/2013  . Rheumatoid arthritis ([place]) 05/18/2013  . Seizure disorder ([place]) 05/18/2013  . Hx of adenomatous colonic polyps 05/18/2013  . GERD 05/08/2010  . NAUSEA 05/08/2010  . FLATULENCE-GAS-BLOATING 05/08/2010    Past Medical History:  Diagnosis Date  . Anxiety    hx of   . Arthritis    RA  . Asthma   . CHF (congestive heart failure) ([place])   . Clotting disorder ([facility])    DVT both legs   . COPD (chronic obstructive pulmonary disease) ([place])   . DDD (degenerative disc disease), cervical    with UE's paresthesias  . Diverticulosis   . DVT, lower extremity ([place])    bilat  . 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 GERD (gastroesophageal reflux disease)   . Gout   . Heart murmur   . History of measles   . History of shingles   . Hypercholesterolemia   . Hypertension   . IBS (irritable bowel syndrome)   . IBS (irritable bowel syndrome)   . Peripheral edema   . Pneumonia    hx of   . Prostate cancer ([place])   . Seizures ([place])    last seizure 20 years ago; on dilantin. Unknown origin.  [name] Shortness of breath dyspnea    exertion   . Sleep apnea    not on cpap  . Tubular adenoma of  colon 04/2008    Family History  Problem Relation Age of Onset  . Skin cancer Father   . Stomach cancer Father   . Heart attack Father   . Alzheimer's disease Mother   . Throat cancer Brother   . Stomach cancer Brother   . Lung cancer Brother   . Heart attack Brother   . Heart attack Brother   . Breast cancer Sister   . Heart attack Sister   . Stroke Sister   . Bladder Cancer Sister   . Heart murmur Sister   . Colon cancer Brother   . Colon polyps Neg Hx    Past Surgical History:  Procedure Laterality Date  . CHOLECYSTECTOMY    . COLONOSCOPY    . CYSTOSCOPY WITH LITHOLAPAXY N/A 03/17/2019   Procedure: CYSTOSCOPY WITH LITHOLAPAXY AND REMOVAL OF FOREIGN BODY;  Surgeon: [name], MD;  Location: [facility];  Service: Urology;  Laterality: N/A;  . DOPPLER ECHOCARDIOGRAPHY N/A 01-21-2012   TECHNICALLY DIFFICULT. MILD CONCENTRIC LV HYPERTROPHY. LV CAVITY IS SMALL.. EF=> 55%.  TRANSMITRAL SPECTRAL FLOW PATTREN IS SUGGESTIVE OF IMPAIRED LV RELAXATION. RV SYSTOLIC PRESSURE IS [id number]. LEFT ATRIAL SIZE IS NORMAL. AV APPEARS MILDLY SCLEROTIC. NO SIGN VALVE DISEASE NOTED.  [name] LYMPHADENECTOMY Bilateral 08/30/2015   Procedure: PELVIC LYMPHADENECTOMY;  Surgeon: [name], MD;  Location: [facility];  Service: Urology;  Laterality: Bilateral;  . NUCLEAR STRESS TEST N/A 01-21-2012   NORMAL PATTERN OF PERFUSION IN ALL REGIONS. POST STRESS LV SIZE IS NORMAL. NO EVIDENCE OF INDUCIBLE ISCHEMIA. EF 54%.  [name] POLYPECTOMY    . PROSTATE BIOPSY    . ROBOT ASSISTED LAPAROSCOPIC RADICAL PROSTATECTOMY N/A 08/30/2015   Procedure: ROBOTIC ASSISTED LAPAROSCOPIC RADICAL PROSTATECTOMY;  Surgeon: [name], MD;  Location: [facility];  Service: Urology;  Laterality: N/A;  . US VENOUS LOWER EXT Right 03/07/11   PERSISTENT DVT IN RIGHT LOWER EXT.WITH PERSISTANT VISUALIZATION OF HYPOECHOIC THROMBUS WITHIN THE FEMORAL, PROFUNDA FEMORAL AND POPLITEAL VEINS. WHEN COMPARED TO PREVIOUS, CLOT IS NO LONGER IDENTIFIED WITH  IN THE RIGHT CFV.   Social History   Social History Narrative  . Not on file   Immunization History  Administered Date(s) Administered  . Fluad Quad(high Dose 65+) 09/30/2019  . Influenza, High Dose Seasonal PF 10/05/2018  . Influenza,inj,Quad PF,6+ Mos 08/31/2015  . Influenza-Unspecified 09/30/2016, 08/22/2017  . Pneumococcal Conjugate-13 10/05/2018  . Pneumococcal-Unspecified 12/30/2012     Objective: Vital Signs: BP (!) 145/86 (BP Location: Left Arm, Patient Position: Sitting, Cuff Size: Normal)   Pulse 65   Resp 16   Ht 5\' 8"  (1.727 m)   Wt 228 lb (103.4 kg)   BMI 34.67 kg/m    Physical Exam Vitals and nursing note reviewed.  Constitutional:      Appearance: He is well-developed.  HENT:     Head: Normocephalic and atraumatic.  Eyes:     Conjunctiva/sclera: Conjunctivae normal.     Pupils: Pupils are equal, round, and reactive to light.  Pulmonary:     Effort: Pulmonary effort is normal.  Abdominal:     General: Bowel sounds are normal.     Palpations: Abdomen is soft.  Musculoskeletal:     Cervical back: Normal range of motion and neck supple.  Skin:    General: Skin is warm and dry.     Capillary Refill: Capillary refill takes less than 2 seconds.  Neurological:     Mental Status: He is alert and oriented to person, place, and time.  Psychiatric:        Behavior: Behavior normal.      Musculoskeletal Exam: C-spine limited range of motion with lateral rotation.  Thoracic kyphosis noted.  Limited range of motion lumbar spine.  No midline spinal tenderness.  Shoulder joints have good range of motion bilaterally.  Elbow joints good range of motion no tenderness or synovitis.  He has tenderness of the right wrist but no synovitis was noted.  MCPs, PIPs, DIPs good range of motion no synovitis.  Hip joints have good range of motion with no discomfort.  Knee joints have good range of motion with no warmth or effusion.  Ankle joints are nontender.  He has pedal edema  bilaterally.  CDAI Exam: CDAI Score: 0.2  Patient Global: 1 mm; Provider Global: 1 mm Swollen: 0 ; Tender: 0  Joint Exam 03/02/2020   No joint exam has been documented for this visit   There is currently no information documented on the homunculus. Go to the Rheumatology activity and complete the homunculus joint exam.  Investigation: No additional findings.  Imaging: No results found.  Recent Labs: Lab Results  Component Value Date   WBC 9.4 01/12/2020   HGB 14.9 01/12/2020   PLT 233 01/12/2020   NA 140 01/12/2020   K 4.2 01/12/2020   CL 100 01/12/2020   CO2 29 01/12/2020   GLUCOSE 100 (H) 01/12/2020   BUN 14 01/12/2020   CREATININE 1.24 01/12/2020   BILITOT 1.0 01/12/2020   ALKPHOS 96 01/12/2020   AST 34 01/12/2020   ALT 21 01/12/2020   PROT 6.9 01/12/2020   ALBUMIN 3.4 (L) 01/12/2020   CALCIUM 9.3 01/12/2020   GFRAA >60 01/12/2020   QFTBGOLDPLUS NEGATIVE 02/16/2020    Speciality Comments: ACTEMRA 4mg /kg x 4 weeks PPD negative 01/02/17  Procedures:  No procedures performed Allergies: Orencia [abatacept], Carbamazepine, Celecoxib, Cephalexin, Enbrel [etanercept], Humira [adalimumab], Levofloxacin, Sulfa antibiotics, and Sulfasalazine   Assessment / Plan:     Visit Diagnoses: Rheumatoid arthritis involving multiple sites with positive rheumatoid factor ([place]): He has no synovitis on exam today.  He has mild tenderness on the dorsal aspect of the right wrist but no inflammation was noted.  He has not had any recent rheumatoid arthritis flares.  He has not been experiencing any morning stiffness or nocturnal pain recently. He is clinically doing well on Actemra 400 mg IV infusions every 28 days and Arava 20 mg 1 tablet by mouth daily.  He has not missed any infusions or doses of Arava recently.  He has an upcoming Actemra infusion next week.  He experiences occasional stiffness in both hands and uses Voltaren gel topically which resolves his discomfort.  He has no other  joint pain or joint swelling at this time.  He will continue on the current treatment regimen.  He was advised to notify us if he develops increased joint pain or joint swelling.  He will follow-up in the office in 5 months.  High risk medication use - Actemra IV 400 mg every 28 days and Arava 20 mg 1 tablet daily.  CBC and CMP were drawn on 01/12/2020.  Labs are stable at that time.  TB gold was negative on 02/16/2020.  Chronic idiopathic gout involving toe without tophus, unspecified laterality: He has not had any recent gout flares.  He is clinically doing well on allopurinol 200 mg by mouth daily.  His uric acid was 7.3 on 03/18/2018.  He is overdue to update uric acid.  Future order was placed today.  Primary osteoarthritis of both hands: He has PIP and DIP synovial thickening consistent with osteoarthritis of both hands.  He has a mucinous cyst over the right second DIP.  He has no tenderness or synovitis on exam today.  He experiences occasional stiffness in both hands and uses Voltaren gel topically which resolves his symptoms.  Joint protection and muscle strengthening were discussed.  Primary osteoarthritis of both knees: He has good range of motion of both knee joints.  No warmth or effusion was noted.  Primary osteoarthritis of both feet: He has no discomfort in his feet at this time.  Age-related osteoporosis without current pathological fracture - He is taking Fosamax 70 mg weekly (started in October 2018).  Last DEXA on 09/28/2019 T-score -2.1 right femur neck with +12 % change in BMD.  He will be due to update his DEXA in September 2022.  He is taking calcium and vitamin D supplement daily.  Other medical conditions are listed as follows:  History of seizure disorder  History of DVT (deep vein thrombosis)  History of CHF (congestive heart failure)  History of COPD  History of prostate cancer  History of adenomatous polyp of colon  Orders: Orders Placed This Encounter   Procedures  . Uric acid   Meds ordered this encounter  Medications  . diclofenac Sodium (VOLTAREN) 1 % GEL    Sig: Apply 2-4 grams to affected joint 4 times daily as needed.    Dispense:  400 g    Refill:  2     Follow-Up Instructions: Return in about 5 months (around 08/02/2020) for Rheumatoid arthritis, Osteoarthritis, Gout, Osteoporosis.   Ofilia Neas, PA-C   I examined and evaluated the patient with Hazel Sams PA.  Patient is clinically doing well with no synovitis on my examination.  This combination is working well for him.  He has had no gout flares.  We will check uric acid with his next labs.  The plan of care was discussed as noted above.  Bo Merino, MD  Note - This record has been created using Editor, commissioning.  Chart creation errors have been sought, but may not always  have been located. Such creation errors do not reflect on  the standard of medical care.

## 2020-03-02 ENCOUNTER — Other Ambulatory Visit: Payer: Self-pay

## 2020-03-02 ENCOUNTER — Other Ambulatory Visit: Payer: Self-pay | Admitting: *Deleted

## 2020-03-02 ENCOUNTER — Ambulatory Visit (INDEPENDENT_AMBULATORY_CARE_PROVIDER_SITE_OTHER): Payer: Medicare Other | Admitting: Rheumatology

## 2020-03-02 ENCOUNTER — Encounter: Payer: Self-pay | Admitting: Rheumatology

## 2020-03-02 VITALS — BP 145/86 | HR 65 | Resp 16 | Ht 68.0 in | Wt 228.0 lb

## 2020-03-02 DIAGNOSIS — Z79899 Other long term (current) drug therapy: Secondary | ICD-10-CM

## 2020-03-02 DIAGNOSIS — M19072 Primary osteoarthritis, left ankle and foot: Secondary | ICD-10-CM

## 2020-03-02 DIAGNOSIS — Z8709 Personal history of other diseases of the respiratory system: Secondary | ICD-10-CM | POA: Diagnosis not present

## 2020-03-02 DIAGNOSIS — Z8669 Personal history of other diseases of the nervous system and sense organs: Secondary | ICD-10-CM | POA: Diagnosis not present

## 2020-03-02 DIAGNOSIS — Z86010 Personal history of colonic polyps: Secondary | ICD-10-CM

## 2020-03-02 DIAGNOSIS — M1A0790 Idiopathic chronic gout, unspecified ankle and foot, without tophus (tophi): Secondary | ICD-10-CM

## 2020-03-02 DIAGNOSIS — M0579 Rheumatoid arthritis with rheumatoid factor of multiple sites without organ or systems involvement: Secondary | ICD-10-CM

## 2020-03-02 DIAGNOSIS — M19071 Primary osteoarthritis, right ankle and foot: Secondary | ICD-10-CM

## 2020-03-02 DIAGNOSIS — M810 Age-related osteoporosis without current pathological fracture: Secondary | ICD-10-CM

## 2020-03-02 DIAGNOSIS — M170 Bilateral primary osteoarthritis of knee: Secondary | ICD-10-CM | POA: Diagnosis not present

## 2020-03-02 DIAGNOSIS — Z86718 Personal history of other venous thrombosis and embolism: Secondary | ICD-10-CM | POA: Diagnosis not present

## 2020-03-02 DIAGNOSIS — Z8679 Personal history of other diseases of the circulatory system: Secondary | ICD-10-CM

## 2020-03-02 DIAGNOSIS — M19042 Primary osteoarthritis, left hand: Secondary | ICD-10-CM

## 2020-03-02 DIAGNOSIS — M19041 Primary osteoarthritis, right hand: Secondary | ICD-10-CM

## 2020-03-02 DIAGNOSIS — Z8546 Personal history of malignant neoplasm of prostate: Secondary | ICD-10-CM | POA: Diagnosis not present

## 2020-03-02 MED ORDER — DICLOFENAC SODIUM 1 % EX GEL: CUTANEOUS | 2 refills | Status: DC

## 2020-03-02 NOTE — Progress Notes (Signed)
Infusion orders are current for patient CBC CMP Tylenol Benadryl appointments are up to date and follow up appointment  is scheduled TB gold not due yet.  

## 2020-03-02 NOTE — Addendum Note (Signed)
Addended by: Carole Binning on: 03/02/2020 11:29 AM   Modules accepted: Orders

## 2020-03-05 ENCOUNTER — Other Ambulatory Visit: Payer: Self-pay | Admitting: Physician Assistant

## 2020-03-07 ENCOUNTER — Ambulatory Visit: Payer: Medicare Other | Admitting: Rheumatology

## 2020-03-08 ENCOUNTER — Other Ambulatory Visit: Payer: Self-pay

## 2020-03-08 ENCOUNTER — Ambulatory Visit (HOSPITAL_COMMUNITY)
Admission: RE | Admit: 2020-03-08 | Discharge: 2020-03-08 | Disposition: A | Discharge status: Home / Self Care | Payer: Medicare Other | Source: Ambulatory Visit | Attending: Rheumatology | Admitting: Rheumatology

## 2020-03-08 ENCOUNTER — Telehealth: Payer: Self-pay | Admitting: *Deleted

## 2020-03-08 DIAGNOSIS — M0579 Rheumatoid arthritis with rheumatoid factor of multiple sites without organ or systems involvement: Secondary | ICD-10-CM | POA: Insufficient documentation

## 2020-03-08 LAB — COMPREHENSIVE METABOLIC PANEL
ALT: 29 U/L (ref 0–44)
AST: 28 U/L (ref 15–41)
Albumin: 4 g/dL (ref 3.5–5.0)
Alkaline Phosphatase: 57 U/L (ref 38–126)
Anion gap: 10 (ref 5–15)
BUN: 11 mg/dL (ref 8–23)
CO2: 30 mmol/L (ref 22–32)
Calcium: 9 mg/dL (ref 8.9–10.3)
Chloride: 100 mmol/L (ref 98–111)
Creatinine, Ser: 1.34 mg/dL — ABNORMAL HIGH (ref 0.61–1.24)
GFR calc Af Amer: >60 mL/min (ref >60)
GFR calc non Af Amer: 54 mL/min — ABNORMAL LOW (ref >60)
Glucose, Bld: 118 mg/dL — ABNORMAL HIGH (ref 70–99)
Potassium: 4.1 mmol/L (ref 3.5–5.1)
Sodium: 140 mmol/L (ref 135–145)
Total Bilirubin: 0.5 mg/dL (ref 0.3–1.2)
Total Protein: 6.6 g/dL (ref 6.5–8.1)

## 2020-03-08 LAB — CBC
HCT: 46.2 % (ref 39.0–52.0)
Hemoglobin: 15 g/dL (ref 13.0–17.0)
MCH: 30 pg (ref 26.0–34.0)
MCHC: 32.5 g/dL (ref 30.0–36.0)
MCV: 92.4 fL (ref 80.0–100.0)
Platelets: 183 10*3/uL (ref 150–400)
RBC: 5 MIL/uL (ref 4.22–5.81)
RDW: 14 % (ref 11.5–15.5)
WBC: 6.3 10*3/uL (ref 4.0–10.5)
nRBC: 0 % (ref 0.0–0.2)

## 2020-03-08 LAB — URIC ACID: Uric Acid, Serum: 5.9 mg/dL (ref 3.7–8.6)

## 2020-03-08 MED ORDER — ALENDRONATE SODIUM 70 MG PO TABS: ORAL_TABLET | ORAL | 0 refills | Status: DC

## 2020-03-08 MED ORDER — DIPHENHYDRAMINE HCL 25 MG PO CAPS: 25.0000 mg | ORAL_CAPSULE | ORAL | Status: DC

## 2020-03-08 MED ORDER — ACETAMINOPHEN 325 MG PO TABS: 650.0000 mg | ORAL_TABLET | ORAL | Status: DC

## 2020-03-08 MED ORDER — TOCILIZUMAB 400 MG/20ML IV SOLN
4.0000 mg/kg | INTRAVENOUS | Status: DC
  Administered 2020-03-08: 414 mg via INTRAVENOUS
  Filled 2020-03-08: qty 20.7

## 2020-03-08 NOTE — Telephone Encounter (Signed)
Patient states he needs a refill on Fosamax.  Last Visit: 03/02/20 Next Visit: 08/04/20 Labs: 03/08/20 Creatinine is elevated-1.34 and GFR is low at 54. CBC WNL.   Okay to refill per Dr. Estanislado Pandy

## 2020-03-08 NOTE — Telephone Encounter (Signed)
-----   Message from Ofilia Neas, PA-C sent at 03/08/2020  4:19 PM EST ----- Uric acid WNL.  Creatinine is elevated-1.34 and GFR is low at 54.  He is taking Lasix 40 mg BID. Please forward labs to PCP.   CBC WNL

## 2020-03-10 DIAGNOSIS — N3941 Urge incontinence: Secondary | ICD-10-CM | POA: Diagnosis not present

## 2020-03-12 ENCOUNTER — Other Ambulatory Visit: Payer: Self-pay | Admitting: Physician Assistant

## 2020-03-13 NOTE — Telephone Encounter (Signed)
Last Visit: 03/02/20 Next Visit: 08/04/20 Labs: 03/08/20 Uric acid WNL. Creatinine is elevated-1.34 and GFR is low at 54. CBC WNL  Current dose per office note on 03/02/20: Arava 20 mg 1 tablet daily  Okay to refill per Dr. Estanislado Pandy

## 2020-03-15 ENCOUNTER — Other Ambulatory Visit: Payer: Self-pay | Admitting: Urology

## 2020-03-17 DIAGNOSIS — N3941 Urge incontinence: Secondary | ICD-10-CM | POA: Diagnosis not present

## 2020-03-24 DIAGNOSIS — N3941 Urge incontinence: Secondary | ICD-10-CM | POA: Diagnosis not present

## 2020-03-30 DIAGNOSIS — N3941 Urge incontinence: Secondary | ICD-10-CM | POA: Diagnosis not present

## 2020-03-30 DIAGNOSIS — R3915 Urgency of urination: Secondary | ICD-10-CM | POA: Diagnosis not present

## 2020-04-04 ENCOUNTER — Other Ambulatory Visit: Payer: Self-pay | Admitting: Rheumatology

## 2020-04-05 ENCOUNTER — Other Ambulatory Visit: Payer: Self-pay | Admitting: *Deleted

## 2020-04-05 ENCOUNTER — Other Ambulatory Visit: Payer: Self-pay

## 2020-04-05 ENCOUNTER — Ambulatory Visit (HOSPITAL_COMMUNITY)
Admission: RE | Admit: 2020-04-05 | Discharge: 2020-04-05 | Disposition: A | Discharge status: Home / Self Care | Payer: Medicare Other | Source: Ambulatory Visit | Attending: Rheumatology | Admitting: Rheumatology

## 2020-04-05 DIAGNOSIS — M0579 Rheumatoid arthritis with rheumatoid factor of multiple sites without organ or systems involvement: Secondary | ICD-10-CM | POA: Insufficient documentation

## 2020-04-05 MED ORDER — TOCILIZUMAB 400 MG/20ML IV SOLN
4.0000 mg/kg | INTRAVENOUS | Status: DC
  Administered 2020-04-05: 400 mg via INTRAVENOUS
  Filled 2020-04-05: qty 20

## 2020-04-05 MED ORDER — ACETAMINOPHEN 325 MG PO TABS: 650.0000 mg | ORAL_TABLET | ORAL | Status: DC

## 2020-04-05 MED ORDER — DIPHENHYDRAMINE HCL 25 MG PO CAPS: 25.0000 mg | ORAL_CAPSULE | ORAL | Status: DC

## 2020-04-05 NOTE — Telephone Encounter (Signed)
Last Visit: 03/02/20 Next Visit: 08/04/20 Labs: 03/08/20 Uric acid WNL. Creatinine is elevated-1.34 and GFR is low at 54. CBC WNL  Current Dose per office note on 03/02/20: allopurinol 200 mg by mouth daily  Okay to refill per Dr. Estanislado Pandy

## 2020-04-07 DIAGNOSIS — W57XXXA Bitten or stung by nonvenomous insect and other nonvenomous arthropods, initial encounter: Secondary | ICD-10-CM | POA: Diagnosis not present

## 2020-04-07 DIAGNOSIS — L539 Erythematous condition, unspecified: Secondary | ICD-10-CM | POA: Diagnosis not present

## 2020-04-07 DIAGNOSIS — R3915 Urgency of urination: Secondary | ICD-10-CM | POA: Diagnosis not present

## 2020-04-07 DIAGNOSIS — N3941 Urge incontinence: Secondary | ICD-10-CM | POA: Diagnosis not present

## 2020-04-14 DIAGNOSIS — R3915 Urgency of urination: Secondary | ICD-10-CM | POA: Diagnosis not present

## 2020-04-14 DIAGNOSIS — N3941 Urge incontinence: Secondary | ICD-10-CM | POA: Diagnosis not present

## 2020-04-21 DIAGNOSIS — R3915 Urgency of urination: Secondary | ICD-10-CM | POA: Diagnosis not present

## 2020-04-21 DIAGNOSIS — N3941 Urge incontinence: Secondary | ICD-10-CM | POA: Diagnosis not present

## 2020-04-28 DIAGNOSIS — R3915 Urgency of urination: Secondary | ICD-10-CM | POA: Diagnosis not present

## 2020-04-28 DIAGNOSIS — N3941 Urge incontinence: Secondary | ICD-10-CM | POA: Diagnosis not present

## 2020-05-02 DIAGNOSIS — I82409 Acute embolism and thrombosis of unspecified deep veins of unspecified lower extremity: Secondary | ICD-10-CM | POA: Diagnosis not present

## 2020-05-03 ENCOUNTER — Other Ambulatory Visit: Payer: Self-pay

## 2020-05-03 ENCOUNTER — Ambulatory Visit (HOSPITAL_COMMUNITY)
Admission: RE | Admit: 2020-05-03 | Discharge: 2020-05-03 | Disposition: A | Discharge status: Home / Self Care | Payer: Medicare Other | Source: Ambulatory Visit | Attending: Rheumatology | Admitting: Rheumatology

## 2020-05-03 DIAGNOSIS — M0579 Rheumatoid arthritis with rheumatoid factor of multiple sites without organ or systems involvement: Secondary | ICD-10-CM

## 2020-05-03 LAB — COMPREHENSIVE METABOLIC PANEL
ALT: 27 U/L (ref 0–44)
AST: 33 U/L (ref 15–41)
Albumin: 3.9 g/dL (ref 3.5–5.0)
Alkaline Phosphatase: 51 U/L (ref 38–126)
Anion gap: 9 (ref 5–15)
BUN: 12 mg/dL (ref 8–23)
CO2: 31 mmol/L (ref 22–32)
Calcium: 8.9 mg/dL (ref 8.9–10.3)
Chloride: 101 mmol/L (ref 98–111)
Creatinine, Ser: 1.36 mg/dL — ABNORMAL HIGH (ref 0.61–1.24)
GFR calc Af Amer: >60 mL/min (ref >60)
GFR calc non Af Amer: 53 mL/min — ABNORMAL LOW (ref >60)
Glucose, Bld: 117 mg/dL — ABNORMAL HIGH (ref 70–99)
Potassium: 4 mmol/L (ref 3.5–5.1)
Sodium: 141 mmol/L (ref 135–145)
Total Bilirubin: 0.7 mg/dL (ref 0.3–1.2)
Total Protein: 6.3 g/dL — ABNORMAL LOW (ref 6.5–8.1)

## 2020-05-03 LAB — CBC
HCT: 44.9 % (ref 39.0–52.0)
Hemoglobin: 14.8 g/dL (ref 13.0–17.0)
MCH: 30.4 pg (ref 26.0–34.0)
MCHC: 33 g/dL (ref 30.0–36.0)
MCV: 92.2 fL (ref 80.0–100.0)
Platelets: 173 10*3/uL (ref 150–400)
RBC: 4.87 MIL/uL (ref 4.22–5.81)
RDW: 14.8 % (ref 11.5–15.5)
WBC: 5.3 10*3/uL (ref 4.0–10.5)
nRBC: 0 % (ref 0.0–0.2)

## 2020-05-03 MED ORDER — DIPHENHYDRAMINE HCL 25 MG PO CAPS: 25.0000 mg | ORAL_CAPSULE | ORAL | Status: DC

## 2020-05-03 MED ORDER — ACETAMINOPHEN 325 MG PO TABS: 650.0000 mg | ORAL_TABLET | ORAL | Status: DC

## 2020-05-03 MED ORDER — TOCILIZUMAB 400 MG/20ML IV SOLN
4.0000 mg/kg | INTRAVENOUS | Status: DC
  Administered 2020-05-03: 400 mg via INTRAVENOUS
  Filled 2020-05-03: qty 20

## 2020-05-03 NOTE — Progress Notes (Signed)
GFR is low but is stable.  Most likely due to  Lasix use.  Please forward labs to his PCP.

## 2020-05-05 ENCOUNTER — Other Ambulatory Visit: Payer: Self-pay | Admitting: *Deleted

## 2020-05-05 DIAGNOSIS — N3941 Urge incontinence: Secondary | ICD-10-CM | POA: Diagnosis not present

## 2020-05-05 DIAGNOSIS — R3915 Urgency of urination: Secondary | ICD-10-CM | POA: Diagnosis not present

## 2020-05-05 NOTE — Progress Notes (Signed)
Infusion orders are current for patient CBC CMP Tylenol Benadryl appointments are up to date and follow up appointment  is scheduled TB gold not due yet, due on 02/15/2021.

## 2020-05-12 DIAGNOSIS — R3915 Urgency of urination: Secondary | ICD-10-CM | POA: Diagnosis not present

## 2020-05-12 DIAGNOSIS — N3941 Urge incontinence: Secondary | ICD-10-CM | POA: Diagnosis not present

## 2020-05-24 ENCOUNTER — Other Ambulatory Visit: Payer: Self-pay | Admitting: Pulmonary Disease

## 2020-05-26 DIAGNOSIS — N3941 Urge incontinence: Secondary | ICD-10-CM | POA: Diagnosis not present

## 2020-05-29 DIAGNOSIS — I50812 Chronic right heart failure: Secondary | ICD-10-CM | POA: Diagnosis not present

## 2020-05-29 DIAGNOSIS — J449 Chronic obstructive pulmonary disease, unspecified: Secondary | ICD-10-CM | POA: Diagnosis not present

## 2020-05-29 DIAGNOSIS — Z87891 Personal history of nicotine dependence: Secondary | ICD-10-CM | POA: Diagnosis not present

## 2020-05-29 DIAGNOSIS — G40309 Generalized idiopathic epilepsy and epileptic syndromes, not intractable, without status epilepticus: Secondary | ICD-10-CM | POA: Diagnosis not present

## 2020-05-31 ENCOUNTER — Other Ambulatory Visit: Payer: Self-pay

## 2020-05-31 ENCOUNTER — Encounter (HOSPITAL_COMMUNITY)
Admission: RE | Admit: 2020-05-31 | Discharge: 2020-05-31 | Disposition: A | Discharge status: Home / Self Care | Payer: Medicare Other | Source: Ambulatory Visit | Attending: Rheumatology | Admitting: Rheumatology

## 2020-05-31 DIAGNOSIS — M0579 Rheumatoid arthritis with rheumatoid factor of multiple sites without organ or systems involvement: Secondary | ICD-10-CM | POA: Diagnosis present

## 2020-05-31 MED ORDER — TOCILIZUMAB 400 MG/20ML IV SOLN
4.0000 mg/kg | INTRAVENOUS | Status: AC
  Administered 2020-05-31: 400 mg via INTRAVENOUS
  Filled 2020-05-31: qty 20

## 2020-05-31 MED ORDER — ACETAMINOPHEN 325 MG PO TABS: 650.0000 mg | ORAL_TABLET | ORAL | Status: DC

## 2020-05-31 MED ORDER — DIPHENHYDRAMINE HCL 25 MG PO CAPS: 25.0000 mg | ORAL_CAPSULE | ORAL | Status: DC

## 2020-06-02 ENCOUNTER — Other Ambulatory Visit: Payer: Self-pay | Admitting: Rheumatology

## 2020-06-02 NOTE — Telephone Encounter (Signed)
Last Visit: 03/02/20 Next Visit: 08/04/20 Labs: 3/10/21Uric acid WNL. Creatinine is elevated-1.34 and GFR is low at 54. CBC WNL  Current Dose per office note on 03/02/20:Fosamax 70 mg weekly   Okay to refill per Dr. Estanislado Pandy

## 2020-06-13 DIAGNOSIS — E6609 Other obesity due to excess calories: Secondary | ICD-10-CM | POA: Diagnosis not present

## 2020-06-13 DIAGNOSIS — Z6833 Body mass index (BMI) 33.0-33.9, adult: Secondary | ICD-10-CM | POA: Diagnosis not present

## 2020-06-13 DIAGNOSIS — W57XXXA Bitten or stung by nonvenomous insect and other nonvenomous arthropods, initial encounter: Secondary | ICD-10-CM | POA: Diagnosis not present

## 2020-06-13 DIAGNOSIS — J069 Acute upper respiratory infection, unspecified: Secondary | ICD-10-CM | POA: Diagnosis not present

## 2020-06-13 DIAGNOSIS — R569 Unspecified convulsions: Secondary | ICD-10-CM | POA: Diagnosis not present

## 2020-06-13 DIAGNOSIS — Z1389 Encounter for screening for other disorder: Secondary | ICD-10-CM | POA: Diagnosis not present

## 2020-06-13 DIAGNOSIS — J449 Chronic obstructive pulmonary disease, unspecified: Secondary | ICD-10-CM | POA: Diagnosis not present

## 2020-06-13 DIAGNOSIS — E7849 Other hyperlipidemia: Secondary | ICD-10-CM | POA: Diagnosis not present

## 2020-06-22 ENCOUNTER — Other Ambulatory Visit: Payer: Self-pay | Admitting: Pharmacist

## 2020-06-22 DIAGNOSIS — M0579 Rheumatoid arthritis with rheumatoid factor of multiple sites without organ or systems involvement: Secondary | ICD-10-CM

## 2020-06-22 IMAGING — DX CHEST - 2 VIEW
2 series · 2 of 2 positions shown · non-contrast
Comparison: 07/05/2017

CLINICAL DATA: Shortness of breath and fever

EXAM:
CHEST - 2 VIEW

[chest pa]
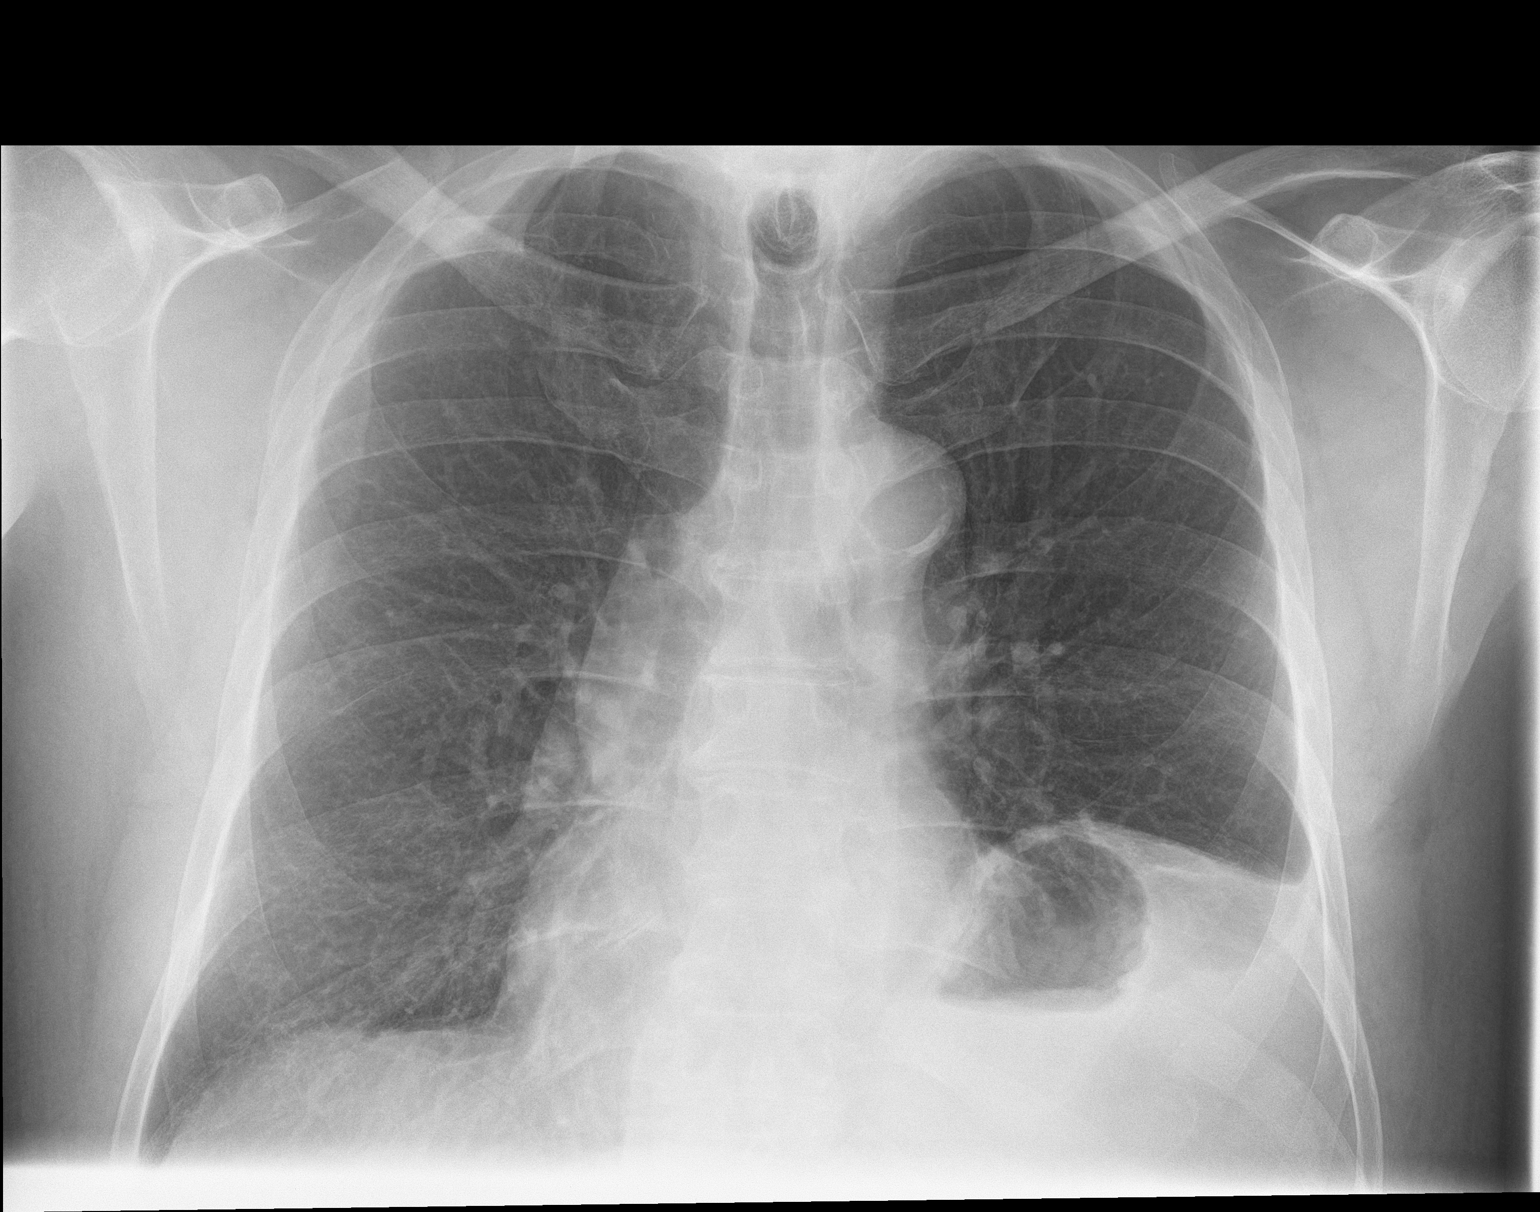

[chest lat]
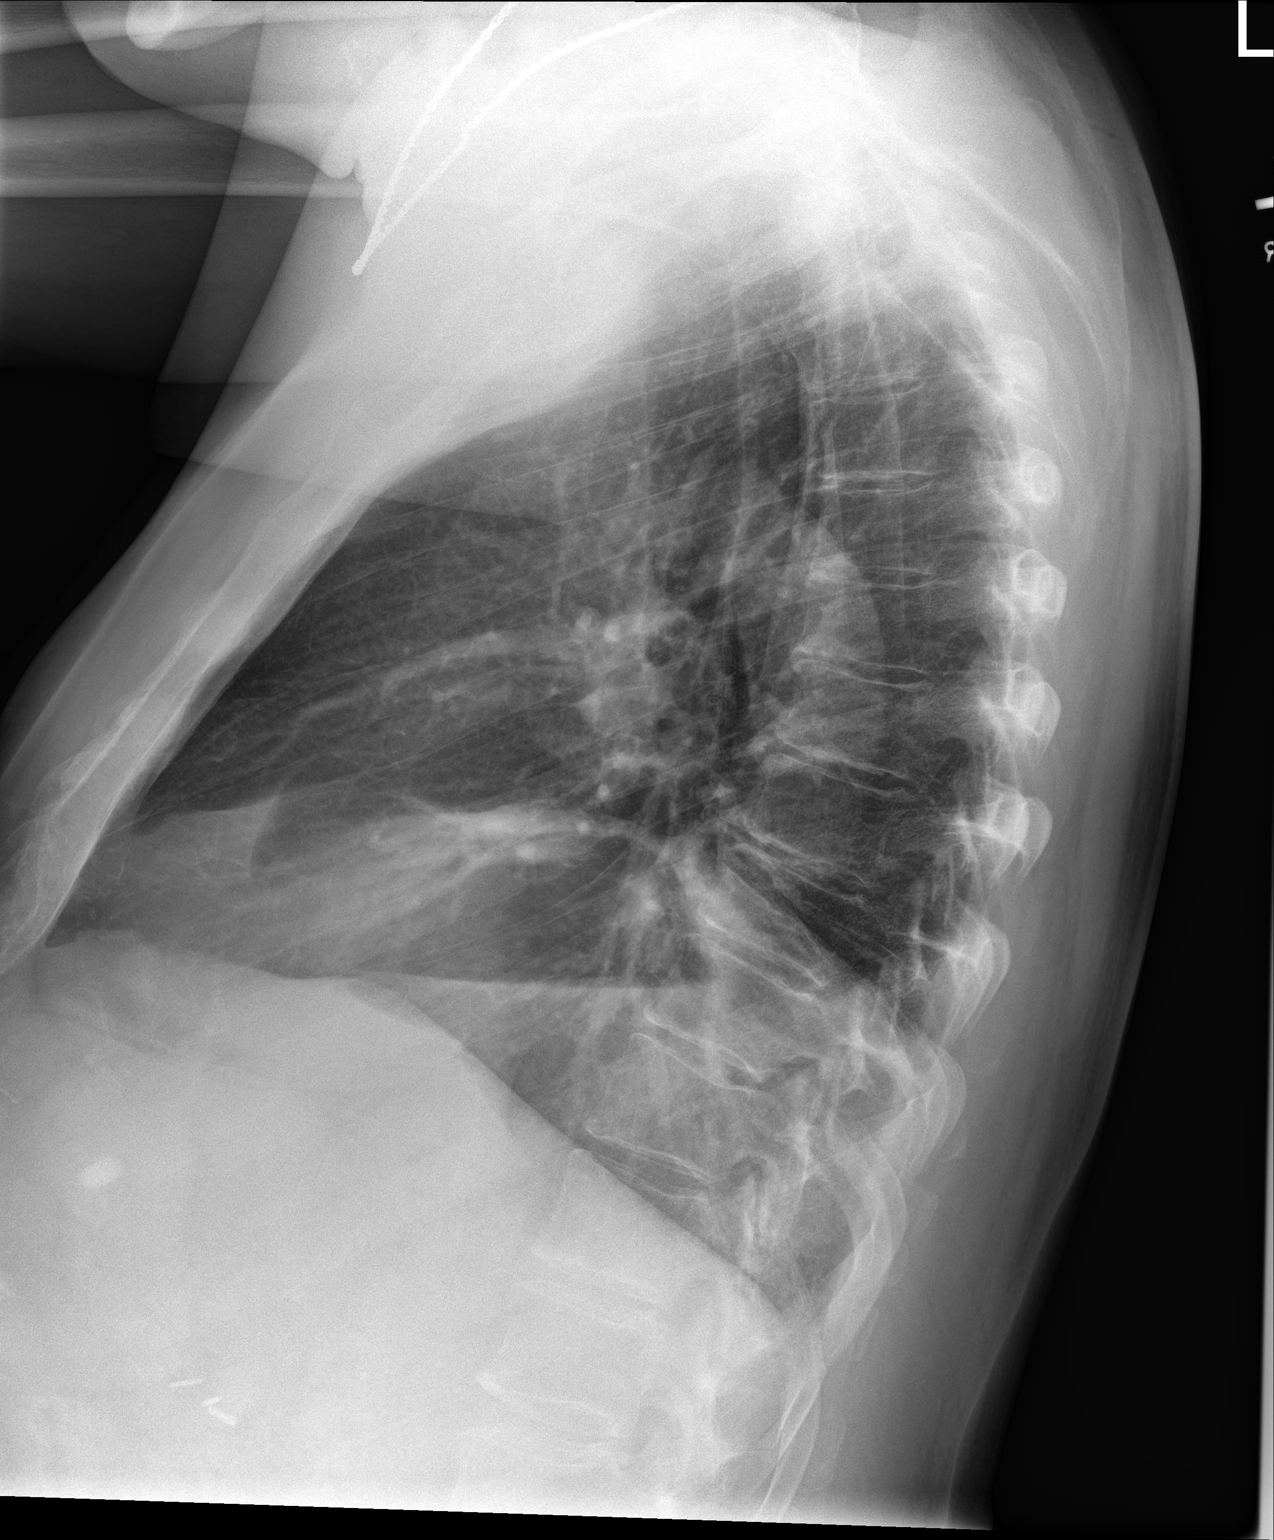

[2 of 2 positions shown; findings below may reference images not displayed]

FINDINGS: There is elevation of the left diaphragm unchanged from the prior
exams. There is no focal consolidation. There is no pleural effusion
or pneumothorax. The heart and mediastinal contours are
unremarkable. There is thoracic aortic atherosclerosis.

The osseous structures are unremarkable.
IMPRESSION: No active cardiopulmonary disease.

## 2020-06-22 NOTE — Progress Notes (Signed)
Last Visit: 03/02/2020 Next Visit: 08/04/2020 Labs: CBC/CMP normal except for low GFR on 05/03/2020.  Patient is on Lasix.  Will continue to monitor. TB Gold: negative 02/16/2020  Medication/Dose/Frequency: Ursula Beath 4mg /kg every 4 weeks  DX: Rheumatoid Arthritis  Last infusion:05/31/2020 Next infusion scheduled: 06/28/2020  Ordered Actemra 4mg /kg every 4 weeks x 2 doses. Standing CBC/CMP still active.  Mariella Saa, PharmD, [place], CPP Clinical Specialty Pharmacist (Rheumatology and Pulmonology)  06/22/2020 3:46 PM

## 2020-06-23 ENCOUNTER — Telehealth: Payer: Self-pay | Admitting: Rheumatology

## 2020-06-23 NOTE — Telephone Encounter (Signed)
Patient's wife ,Bethena Roys, advised if patient's symptoms have completely resolved and he is no longer on antibiotics he can proceed with his infusion on 06/28/20.

## 2020-06-23 NOTE — Telephone Encounter (Signed)
If his symptoms have completely resolved and he is no longer on antibiotics he can proceed with his infusion on 06/28/20.

## 2020-06-23 NOTE — Telephone Encounter (Signed)
Patient states he had a cold and saw his PCP and has finished the antibiotics. Patient states he is scheduled for an infusion on 06/28/2020 and will be off the antibiotics 10 days at that point. Patient would like to know if he may keep this appointment or should he postpone the appointment. Please advise.

## 2020-06-23 NOTE — Telephone Encounter (Signed)
Patient left a voicemail requesting a return call.   °

## 2020-06-26 ENCOUNTER — Telehealth: Payer: Self-pay | Admitting: Rheumatology

## 2020-06-26 NOTE — Telephone Encounter (Addendum)
Patient had a cold, and was on antibiotics. Patient finished antibiotics 11 days ago. Patient wants to know if it is okay for him to have his infusion on Wednesday. Patient states he left a message Friday, but no one called him back.

## 2020-06-26 NOTE — Telephone Encounter (Signed)
Patient advised a call was returned on Friday 06/23/2020 and spoke with his wife Bethena Roys. Patient advised if his symptoms have completely resolved and he is no longer on antibiotics he can proceed with his infusion on 06/28/20. Patient verbalized understanding.

## 2020-06-27 ENCOUNTER — Other Ambulatory Visit: Payer: Self-pay | Admitting: Rheumatology

## 2020-06-27 NOTE — Telephone Encounter (Signed)
Last Visit: 03/02/20 Next Visit: 08/04/20 Labs: 05/03/2020 GFR is low but is stable.  Current Dose per office note 03/02/2020:  allopurinol 200 mg by mouth daily  Okay to refill Allopurinol?

## 2020-06-28 ENCOUNTER — Other Ambulatory Visit: Payer: Self-pay

## 2020-06-28 ENCOUNTER — Ambulatory Visit (HOSPITAL_COMMUNITY)
Admission: RE | Admit: 2020-06-28 | Discharge: 2020-06-28 | Disposition: A | Discharge status: Home / Self Care | Payer: Medicare Other | Source: Ambulatory Visit | Attending: Interventional Radiology | Admitting: Interventional Radiology

## 2020-06-28 DIAGNOSIS — M0579 Rheumatoid arthritis with rheumatoid factor of multiple sites without organ or systems involvement: Secondary | ICD-10-CM | POA: Insufficient documentation

## 2020-06-28 MED ORDER — DIPHENHYDRAMINE HCL 25 MG PO CAPS: 25.0000 mg | ORAL_CAPSULE | Freq: Once | ORAL | Status: DC

## 2020-06-28 MED ORDER — TOCILIZUMAB 400 MG/20ML IV SOLN
4.0000 mg/kg | INTRAVENOUS | Status: DC
  Administered 2020-06-28: 400 mg via INTRAVENOUS
  Filled 2020-06-28: qty 20

## 2020-06-28 MED ORDER — ACETAMINOPHEN 325 MG PO TABS: 650.0000 mg | ORAL_TABLET | Freq: Once | ORAL | Status: DC

## 2020-06-29 DIAGNOSIS — R350 Frequency of micturition: Secondary | ICD-10-CM | POA: Diagnosis not present

## 2020-07-21 NOTE — Progress Notes (Signed)
Office Visit Note  Patient: [name]             Date of Birth: [birth year]           MRN: [id number]             PCP: [name], MD Referring: [name], MD Visit Date: 08/04/2020 Occupation: @GUAROCC @  Subjective:  Medication monitoring.   History of Present Illness: [name] is a 69 y.o. male with history of rheumatoid arthritis, gout and osteoarthritis.  He states he is doing quite well on combination of IV Actemra and Arava.  He denies any joint pain or joint swelling.  He has some stiffness in his cervical spine.  He has not had any gout flare.  He has been taking allopurinol 200 mg a day.  Activities of Daily Living:  Patient reports morning stiffness for 10 minutes.   Patient Denies nocturnal pain.  Difficulty dressing/grooming: Denies Difficulty climbing stairs: Denies Difficulty getting out of chair: Denies Difficulty using hands for taps, buttons, cutlery, and/or writing: Denies  Review of Systems  Constitutional: Positive for fatigue. Negative for night sweats.  HENT: Negative for mouth sores, mouth dryness and nose dryness.   Eyes: Negative for redness, itching and dryness.  Respiratory: Positive for shortness of breath and difficulty breathing.        COPD  Cardiovascular: Negative for chest pain, palpitations, hypertension, irregular heartbeat and swelling in legs/feet.  Gastrointestinal: Positive for blood in stool. Negative for constipation and diarrhea.       Followed by GI  Endocrine: Negative for increased urination.  Genitourinary: Negative for difficulty urinating.  Musculoskeletal: Positive for arthralgias, joint pain and morning stiffness. Negative for joint swelling, myalgias, muscle weakness, muscle tenderness and myalgias.  Skin: Negative for color change, rash, hair loss, nodules/bumps, redness, skin tightness, ulcers and sensitivity to sunlight.  Allergic/Immunologic: Negative for susceptible to infections.  Neurological:  Negative for dizziness, fainting, numbness, headaches, memory loss, night sweats and weakness.  Hematological: Positive for bruising/bleeding tendency. Negative for swollen glands.  Psychiatric/Behavioral: Negative for depressed mood, confusion and sleep disturbance. The patient is not nervous/anxious.     PMFS History:  Patient Active Problem List   Diagnosis Date Noted  . OAB (overactive bladder) 02/23/2020  . Acute hypoxemic respiratory failure due to COVID-19 ([place]) 11/17/2019  . CAP (community acquired pneumonia) 11/16/2019  . Acute respiratory failure with hypoxia ([place]) 11/16/2019  . Pneumonia due to COVID-19 virus 11/16/2019  . Class 2 severe obesity due to excess calories with serious comorbidity and body mass index (BMI) of 35.0 to 35.9 in adult [facility])   . Chronic diastolic HF (heart failure) ([place])   . Serratia sepsis ([place]) 06/08/2019  . Bacteremia due to Gram-negative bacteria 06/08/2019  . Sepsis due to cellulitis ([place]) 06/07/2019  . Voice hoarseness 04/26/2019  . OSA (obstructive sleep apnea) 03/10/2019  . Abnormal CT of the chest 02/09/2019  . Dyspnea on exertion 10/22/2017  . History of seizure disorder 02/27/2017  . Primary osteoarthritis of both hands 02/27/2017  . Primary osteoarthritis of both feet 02/27/2017  . Idiopathic gout of multiple sites 02/27/2017  . High risk medication use 12/29/2016  . History of prostate cancer 12/29/2016  . Primary osteoarthritis of both knees 12/29/2016  . Chronic idiopathic gout involving toe without tophus 12/29/2016  . History of CHF (congestive heart failure) 12/29/2016  . History of COPD 12/29/2016  . Plantar pustular psoriasis 12/29/2016  . DVT (deep venous thrombosis) ([place]) 10/31/2016  .  COPD with acute exacerbation ([place]) 10/31/2016  . CHF (congestive heart failure) ([place]) 10/31/2016  . Prostate cancer ([place]) 08/30/2015  . Malignant neoplasm of prostate ([place]) 07/04/2015  . Chest pain at rest 07/05/2014  . Weakness 07/05/2014    . Complicated postphlebitic syndrome 11/16/2013  . Personal history of DVT (deep vein thrombosis) 05/18/2013  . Chronic anticoagulation 05/18/2013  . Diverticulosis of colon without hemorrhage 05/18/2013  . Rheumatoid arthritis ([place]) 05/18/2013  . Seizure disorder ([place]) 05/18/2013  . Hx of adenomatous colonic polyps 05/18/2013  . GERD 05/08/2010  . NAUSEA 05/08/2010  . FLATULENCE-GAS-BLOATING 05/08/2010    Past Medical History:  Diagnosis Date  . Anxiety    hx of   . Arthritis    RA  . Asthma   . CHF (congestive heart failure) ([place])   . Clotting disorder (H[facility])    DVT both legs   . COPD (chronic obstructive pulmonary disease) ([place])   . DDD (degenerative disc disease), cervical    with UE's paresthesias  . Diverticulosis   . DVT, lower extremity ([place])    bilat  . 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 GERD (gastroesophageal reflux disease)   . Gout   . Heart murmur   . History of measles   . History of shingles   . Hypercholesterolemia   . Hypertension   . IBS (irritable bowel syndrome)   . IBS (irritable bowel syndrome)   . Peripheral edema   . Pneumonia    hx of   . Prostate cancer ([place])   . Seizures ([place])    last seizure 20 years ago; on dilantin. Unknown origin.  [name] Shortness of breath dyspnea    exertion   . Sleep apnea    not on cpap  . Tubular adenoma of colon 04/2008    Family History  Problem Relation Age of Onset  . Skin cancer Father   . Stomach cancer Father   . Heart attack Father   . Alzheimer's disease Mother   . Throat cancer Brother   . Stomach cancer Brother   . Lung cancer Brother   . Heart attack Brother   . Heart attack Brother   . Breast cancer Sister   . Heart attack Sister   . Stroke Sister   . Bladder Cancer Sister   . Heart murmur Sister   . Colon cancer Brother   . Colon polyps Neg Hx    Past Surgical History:  Procedure Laterality Date  . CHOLECYSTECTOMY    .  COLONOSCOPY    . CYSTOSCOPY WITH LITHOLAPAXY N/A 03/17/2019   Procedure: CYSTOSCOPY WITH LITHOLAPAXY AND REMOVAL OF FOREIGN BODY;  Surgeon: [name], MD;  Location: [facility];  Service: Urology;  Laterality: N/A;  . DOPPLER ECHOCARDIOGRAPHY N/A 01-21-2012   TECHNICALLY DIFFICULT. MILD CONCENTRIC LV HYPERTROPHY. LV CAVITY IS SMALL.. EF=> 55%. TRANSMITRAL SPECTRAL FLOW PATTREN IS SUGGESTIVE OF IMPAIRED LV RELAXATION. RV SYSTOLIC PRESSURE IS 56EPPI. LEFT ATRIAL SIZE IS NORMAL. AV APPEARS MILDLY SCLEROTIC. NO SIGN VALVE DISEASE NOTED.  [name] LYMPHADENECTOMY Bilateral 08/30/2015   Procedure: PELVIC LYMPHADENECTOMY;  Surgeon: [name], MD;  Location: [facility];  Service: Urology;  Laterality: Bilateral;  . NUCLEAR STRESS TEST N/A 01-21-2012   NORMAL PATTERN OF PERFUSION IN ALL REGIONS. POST STRESS LV SIZE IS NORMAL. NO EVIDENCE OF INDUCIBLE ISCHEMIA. EF 54%.  [name] POLYPECTOMY    . PROSTATE BIOPSY    .  ROBOT ASSISTED LAPAROSCOPIC RADICAL PROSTATECTOMY N/A 08/30/2015   Procedure: ROBOTIC ASSISTED LAPAROSCOPIC RADICAL PROSTATECTOMY;  Surgeon: [name], MD;  Location: [facility];  Service: Urology;  Laterality: N/A;  . US VENOUS LOWER EXT Right 03/07/11   PERSISTENT DVT IN RIGHT LOWER EXT.WITH PERSISTANT VISUALIZATION OF HYPOECHOIC THROMBUS WITHIN THE FEMORAL, PROFUNDA FEMORAL AND POPLITEAL VEINS. WHEN COMPARED TO PREVIOUS, CLOT IS NO LONGER IDENTIFIED WITH IN THE RIGHT CFV.   Social History   Social History Narrative  . Not on file   Immunization History  Administered Date(s) Administered  . Fluad Quad(high Dose 65+) 09/30/2019  . Influenza, High Dose Seasonal PF 10/05/2018  . Influenza,inj,Quad PF,6+ Mos 08/31/2015  . Influenza-Unspecified 09/30/2016, 08/22/2017  . Moderna SARS-COVID-2 Vaccination 01/21/2020, 02/18/2020  . Pneumococcal Conjugate-13 10/05/2018  . Pneumococcal-Unspecified 12/30/2012     Objective: Vital Signs: BP (!) 157/89 (BP Location: Left Arm, Patient Position:  Sitting, Cuff Size: Normal)   Pulse 60   Resp 17   Ht 5\' 8"  (1.727 m)   Wt 234 lb (106.1 kg)   BMI 35.58 kg/m    Physical Exam Vitals and nursing note reviewed.  Constitutional:      Appearance: He is well-developed.  HENT:     Head: Normocephalic and atraumatic.  Eyes:     Conjunctiva/sclera: Conjunctivae normal.     Pupils: Pupils are equal, round, and reactive to light.  Cardiovascular:     Rate and Rhythm: Normal rate and regular rhythm.     Heart sounds: Normal heart sounds.  Pulmonary:     Effort: Pulmonary effort is normal.     Breath sounds: Normal breath sounds.  Abdominal:     General: Bowel sounds are normal.     Palpations: Abdomen is soft.  Musculoskeletal:     Cervical back: Normal range of motion and neck supple.  Skin:    General: Skin is warm and dry.     Capillary Refill: Capillary refill takes less than 2 seconds.  Neurological:     Mental Status: He is alert and oriented to person, place, and time.  Psychiatric:        Behavior: Behavior normal.      Musculoskeletal Exam: He has some stiffness range of motion of his cervical spine.  Shoulder joints, elbow joints, wrist joints with good range of motion.  He had no synovitis over MCPs PIPs or DIPs.  Some PIP and DIP thickening was noted.  Hip joints, knee joints, ankles with good range of motion.  He had no tenderness across MTPs.  CDAI Exam: CDAI Score: 0.2  Patient Global: 1 mm; Provider Global: 1 mm Swollen: 0 ; Tender: 0  Joint Exam 08/04/2020   No joint exam has been documented for this visit   There is currently no information documented on the homunculus. Go to the Rheumatology activity and complete the homunculus joint exam.  Investigation: No additional findings.  Imaging: XR Hand 2 View Left  Result Date: 08/04/2020 CMC PIP and DIP narrowing was noted.  Juxta-articular osteopenia was noted.  Intercarpal or radiocarpal joint space narrowing was noted.  No erosive changes were noted.   No radiographic progression was noted when compared with the x-rays of 2019. Impression: These findings are consistent with rheumatoid arthritis and osteoarthritis overlap.  XR Hand 2 View Right  Result Date: 08/04/2020 Right first MCP narrowing was noted.  Juxta-articular osteopenia was noted.  PIP and DIP narrowing was noted.  No intercarpal or radiocarpal joint space narrowing was noted.  No radiographic progression was noted when compared to the x-rays of March 26, 2018. Impression: These findings are consistent with rheumatoid arthritis and osteoarthritis overlap.   Recent Labs: Lab Results  Component Value Date   WBC 5.2 07/26/2020   HGB 14.5 07/26/2020   PLT 154 07/26/2020   NA 142 07/26/2020   K 4.0 07/26/2020   CL 101 07/26/2020   CO2 30 07/26/2020   GLUCOSE 116 (H) 07/26/2020   BUN 16 07/26/2020   CREATININE 1.41 (H) 07/26/2020   BILITOT 0.8 07/26/2020   ALKPHOS 49 07/26/2020   AST 31 07/26/2020   ALT 29 07/26/2020   PROT 6.3 (L) 07/26/2020   ALBUMIN 3.9 07/26/2020   CALCIUM 9.4 07/26/2020   GFRAA 58 (L) 07/26/2020   QFTBGOLDPLUS NEGATIVE 02/16/2020    Speciality Comments: ACTEMRA 4mg /kg x 4 weeks PPD negative 01/02/17  Procedures:  No procedures performed Allergies: Orencia [abatacept], Carbamazepine, Celecoxib, Cephalexin, Enbrel [etanercept], Humira [adalimumab], Levofloxacin, Sulfa antibiotics, and Sulfasalazine   Assessment / Plan:     Visit Diagnoses: Rheumatoid arthritis involving multiple sites with positive rheumatoid factor ([place]) -he had no synovitis on examination.  He ha been tolerating medications well.  Plan: XR Hand 2 View Right, XR Hand 2 View Left.  I reviewed x-rays today.  He had no radiographic progression compared to the previous films of 2019.  High risk medication use - Actemra 4mg /kg every 4 weeks and Arava 20 mg 1 tablet daily.  His labs are stable.  He has had Covid vaccination.  I have advised him to continue to wear mask, practice social  distancing and hand hygiene due to immunosuppression and autoimmune disease.  He was also advised to take booster when available.  In case he develops COVID-19 infection he will be a candidate for antibody infusion.  Chronic idiopathic gout involving toe without tophus, unspecified laterality - allopurinol 200 mg by mouth daily.  He denies having any gout flares.  His uric acid in March 2021 was 5.9 which is in desirable range.  Dietary modifications were discussed.  Primary osteoarthritis of both hands-he has some DIP and PIP thickening.  Joint protection was discussed.  Primary osteoarthritis of both knees-he is currently not having much discomfort.  Primary osteoarthritis of both feet-proper fitting shoes were discussed.  Age-related osteoporosis without current pathological fracture - Fosamax 70 mg weekly (started in October 2018).  Last DEXA on 09/28/2019 T-score -2.1 right femur neck with +12 % change in BMD I advised repeating DEXA scan in September 2022.  After that we may give him a drug holiday.  History of DVT (deep vein thrombosis)  History of CHF (congestive heart failure)  History of COPD-followed by Dr. Chase Caller.  History of seizure disorder  History of adenomatous polyp of colon  History of prostate cancer  Orders: Orders Placed This Encounter  Procedures  . XR Hand 2 View Right  . XR Hand 2 View Left   No orders of the defined types were placed in this encounter.   Follow-Up Instructions: Return in about 5 months (around 01/04/2021) for Rheumatoid arthritis, Osteoarthritis, Osteoporosis.   Bo Merino, MD  Note - This record has been created using Editor, commissioning.  Chart creation errors have been sought, but may not always  have been located. Such creation errors do not reflect on  the standard of medical care.

## 2020-07-26 ENCOUNTER — Other Ambulatory Visit: Payer: Self-pay

## 2020-07-26 ENCOUNTER — Ambulatory Visit (HOSPITAL_COMMUNITY)
Admission: RE | Admit: 2020-07-26 | Discharge: 2020-07-26 | Disposition: A | Discharge status: Home / Self Care | Payer: Medicare Other | Source: Ambulatory Visit | Attending: Rheumatology | Admitting: Rheumatology

## 2020-07-26 DIAGNOSIS — M0579 Rheumatoid arthritis with rheumatoid factor of multiple sites without organ or systems involvement: Secondary | ICD-10-CM | POA: Insufficient documentation

## 2020-07-26 LAB — CBC
HCT: 44.6 % (ref 39.0–52.0)
Hemoglobin: 14.5 g/dL (ref 13.0–17.0)
MCH: 30.3 pg (ref 26.0–34.0)
MCHC: 32.5 g/dL (ref 30.0–36.0)
MCV: 93.3 fL (ref 80.0–100.0)
Platelets: 154 10*3/uL (ref 150–400)
RBC: 4.78 MIL/uL (ref 4.22–5.81)
RDW: 13.9 % (ref 11.5–15.5)
WBC: 5.2 10*3/uL (ref 4.0–10.5)
nRBC: 0 % (ref 0.0–0.2)

## 2020-07-26 LAB — COMPREHENSIVE METABOLIC PANEL
ALT: 29 U/L (ref 0–44)
AST: 31 U/L (ref 15–41)
Albumin: 3.9 g/dL (ref 3.5–5.0)
Alkaline Phosphatase: 49 U/L (ref 38–126)
Anion gap: 11 (ref 5–15)
BUN: 16 mg/dL (ref 8–23)
CO2: 30 mmol/L (ref 22–32)
Calcium: 9.4 mg/dL (ref 8.9–10.3)
Chloride: 101 mmol/L (ref 98–111)
Creatinine, Ser: 1.41 mg/dL — ABNORMAL HIGH (ref 0.61–1.24)
GFR calc Af Amer: 58 mL/min — ABNORMAL LOW (ref >60)
GFR calc non Af Amer: 50 mL/min — ABNORMAL LOW (ref >60)
Glucose, Bld: 116 mg/dL — ABNORMAL HIGH (ref 70–99)
Potassium: 4 mmol/L (ref 3.5–5.1)
Sodium: 142 mmol/L (ref 135–145)
Total Bilirubin: 0.8 mg/dL (ref 0.3–1.2)
Total Protein: 6.3 g/dL — ABNORMAL LOW (ref 6.5–8.1)

## 2020-07-26 MED ORDER — ACETAMINOPHEN 325 MG PO TABS: 650.0000 mg | ORAL_TABLET | Freq: Once | ORAL | Status: DC

## 2020-07-26 MED ORDER — DIPHENHYDRAMINE HCL 25 MG PO CAPS: 25.0000 mg | ORAL_CAPSULE | Freq: Once | ORAL | Status: DC

## 2020-07-26 MED ORDER — TOCILIZUMAB 400 MG/20ML IV SOLN
4.0000 mg/kg | INTRAVENOUS | Status: DC
  Administered 2020-07-26: 400 mg via INTRAVENOUS
  Filled 2020-07-26: qty 20

## 2020-07-26 NOTE — Progress Notes (Signed)
Creatinine is elevated most likely due to the use of Lasix.  Please forward labs to his PCP.  CBC is normal.

## 2020-07-28 DIAGNOSIS — G40309 Generalized idiopathic epilepsy and epileptic syndromes, not intractable, without status epilepticus: Secondary | ICD-10-CM | POA: Diagnosis not present

## 2020-07-28 DIAGNOSIS — J449 Chronic obstructive pulmonary disease, unspecified: Secondary | ICD-10-CM | POA: Diagnosis not present

## 2020-07-28 DIAGNOSIS — I50812 Chronic right heart failure: Secondary | ICD-10-CM | POA: Diagnosis not present

## 2020-07-28 DIAGNOSIS — Z87891 Personal history of nicotine dependence: Secondary | ICD-10-CM | POA: Diagnosis not present

## 2020-08-02 DIAGNOSIS — N3946 Mixed incontinence: Secondary | ICD-10-CM | POA: Diagnosis not present

## 2020-08-04 ENCOUNTER — Ambulatory Visit (INDEPENDENT_AMBULATORY_CARE_PROVIDER_SITE_OTHER): Payer: Medicare Other

## 2020-08-04 ENCOUNTER — Ambulatory Visit (INDEPENDENT_AMBULATORY_CARE_PROVIDER_SITE_OTHER): Payer: Medicare Other | Admitting: Rheumatology

## 2020-08-04 ENCOUNTER — Encounter: Payer: Self-pay | Admitting: Rheumatology

## 2020-08-04 ENCOUNTER — Ambulatory Visit: Payer: Self-pay

## 2020-08-04 ENCOUNTER — Other Ambulatory Visit: Payer: Self-pay

## 2020-08-04 VITALS — BP 157/89 | HR 60 | Resp 17 | Ht 68.0 in | Wt 234.0 lb

## 2020-08-04 DIAGNOSIS — M810 Age-related osteoporosis without current pathological fracture: Secondary | ICD-10-CM | POA: Diagnosis not present

## 2020-08-04 DIAGNOSIS — M19042 Primary osteoarthritis, left hand: Secondary | ICD-10-CM

## 2020-08-04 DIAGNOSIS — Z8679 Personal history of other diseases of the circulatory system: Secondary | ICD-10-CM

## 2020-08-04 DIAGNOSIS — Z86718 Personal history of other venous thrombosis and embolism: Secondary | ICD-10-CM

## 2020-08-04 DIAGNOSIS — M170 Bilateral primary osteoarthritis of knee: Secondary | ICD-10-CM

## 2020-08-04 DIAGNOSIS — M19071 Primary osteoarthritis, right ankle and foot: Secondary | ICD-10-CM | POA: Diagnosis not present

## 2020-08-04 DIAGNOSIS — M1A0790 Idiopathic chronic gout, unspecified ankle and foot, without tophus (tophi): Secondary | ICD-10-CM

## 2020-08-04 DIAGNOSIS — M19072 Primary osteoarthritis, left ankle and foot: Secondary | ICD-10-CM

## 2020-08-04 DIAGNOSIS — Z86010 Personal history of colonic polyps: Secondary | ICD-10-CM | POA: Diagnosis not present

## 2020-08-04 DIAGNOSIS — M0579 Rheumatoid arthritis with rheumatoid factor of multiple sites without organ or systems involvement: Secondary | ICD-10-CM | POA: Diagnosis not present

## 2020-08-04 DIAGNOSIS — Z79899 Other long term (current) drug therapy: Secondary | ICD-10-CM

## 2020-08-04 DIAGNOSIS — Z8709 Personal history of other diseases of the respiratory system: Secondary | ICD-10-CM | POA: Diagnosis not present

## 2020-08-04 DIAGNOSIS — Z8546 Personal history of malignant neoplasm of prostate: Secondary | ICD-10-CM

## 2020-08-04 DIAGNOSIS — Z860101 Personal history of adenomatous and serrated colon polyps: Secondary | ICD-10-CM

## 2020-08-04 DIAGNOSIS — M19041 Primary osteoarthritis, right hand: Secondary | ICD-10-CM | POA: Diagnosis not present

## 2020-08-04 DIAGNOSIS — Z8669 Personal history of other diseases of the nervous system and sense organs: Secondary | ICD-10-CM | POA: Diagnosis not present

## 2020-08-04 NOTE — Patient Instructions (Signed)
   COVID-19 vaccine recommendations:   COVID-19 vaccine is recommended for everyone (unless you are allergic to a vaccine component), even if you are on a medication that suppresses your immune system.   If you are on Methotrexate, Cellcept (mycophenolate), Rinvoq, Xeljanz, and Olumiant- hold the medication for 1 week after each vaccine. Hold Methotrexate for 2 weeks after the single dose COVID-19 vaccine.   If you are on Orencia subcutaneous injection - hold medication one week prior to and one week after the first COVID-19 vaccine dose (only).   If you are on Orencia IV infusions- time vaccination administration so that the first COVID-19 vaccination will occur four weeks after the infusion and postpone the subsequent infusion by one week.   If you are on Cyclophosphamide or Rituxan infusions please contact your doctor prior to receiving the COVID-19 vaccine.   Do not take Tylenol or ant anti-inflammatory medications (NSAIDs) 24 hours prior to the COVID-19 vaccination.   There is no direct evidence about the efficacy of the COVID-19 vaccine in individuals who are on medications that suppress the immune system.   Even if you are fully vaccinated, and you are on any medications that suppress your immune system, please continue to wear a mask, maintain at least six feet social distance and practice hand hygiene.   If you develop a COVID-19 infection, please contact your PCP or our office to determine if you need antibody infusion.  We anticipate that a booster vaccine will be available soon for immunosuppressed individuals. Please cal our office before receiving your booster dose to make adjustments to your medication regimen.  

## 2020-08-11 ENCOUNTER — Other Ambulatory Visit: Payer: Self-pay | Admitting: Pharmacist

## 2020-08-11 DIAGNOSIS — Z79899 Other long term (current) drug therapy: Secondary | ICD-10-CM

## 2020-08-11 DIAGNOSIS — M0579 Rheumatoid arthritis with rheumatoid factor of multiple sites without organ or systems involvement: Secondary | ICD-10-CM

## 2020-08-11 NOTE — Progress Notes (Signed)
Next infusion scheduled for 08/23/20 and due for updated orders.  Last Visit: 08/04/20 Next Visit: 01/12/21 Labs: 07/26/20 TB Gold: 02/16/20  Orders for Actemra 4mg /kg every 28 days x 3 doses along with premedication of Tylenol and Benadryl.  Standing order for CBC/CMP also placed.   Mariella Saa, PharmD, [place], CPP Clinical Specialty Pharmacist (Rheumatology and Pulmonology)  08/11/2020 10:08 AM

## 2020-08-18 DIAGNOSIS — N3946 Mixed incontinence: Secondary | ICD-10-CM | POA: Diagnosis not present

## 2020-08-18 DIAGNOSIS — R351 Nocturia: Secondary | ICD-10-CM | POA: Diagnosis not present

## 2020-08-22 ENCOUNTER — Other Ambulatory Visit: Payer: Self-pay | Admitting: Rheumatology

## 2020-08-22 DIAGNOSIS — Z23 Encounter for immunization: Secondary | ICD-10-CM | POA: Diagnosis not present

## 2020-08-22 NOTE — Telephone Encounter (Signed)
Last visit: 08/04/2020 Next Visit: 01/12/2021 Labs: 7/28/2021Creatinine is elevated most likely due to the use of Lasix.  CBC is normal.  Current Dose per office note on 08/04/2020: Arava 20 mg 1 tablet daily Dx: Rheumatoid arthritis involving multiple sites with positive rheumatoid factor   Okay to refill Arava?

## 2020-08-23 ENCOUNTER — Ambulatory Visit: Payer: Medicare Other | Admitting: Urology

## 2020-08-23 ENCOUNTER — Ambulatory Visit (HOSPITAL_COMMUNITY)
Admission: RE | Admit: 2020-08-23 | Discharge: 2020-08-23 | Disposition: A | Discharge status: Home / Self Care | Payer: Medicare Other | Source: Ambulatory Visit | Attending: Rheumatology | Admitting: Rheumatology

## 2020-08-23 ENCOUNTER — Other Ambulatory Visit: Payer: Self-pay

## 2020-08-23 DIAGNOSIS — M0579 Rheumatoid arthritis with rheumatoid factor of multiple sites without organ or systems involvement: Secondary | ICD-10-CM

## 2020-08-23 MED ORDER — ACETAMINOPHEN 325 MG PO TABS: 650.0000 mg | ORAL_TABLET | Freq: Once | ORAL | Status: DC

## 2020-08-23 MED ORDER — DIPHENHYDRAMINE HCL 25 MG PO CAPS: 25.0000 mg | ORAL_CAPSULE | Freq: Once | ORAL | Status: DC

## 2020-08-23 MED ORDER — TOCILIZUMAB 400 MG/20ML IV SOLN
400.0000 mg | INTRAVENOUS | Status: DC
  Administered 2020-08-23: 400 mg via INTRAVENOUS
  Filled 2020-08-23: qty 20

## 2020-08-25 ENCOUNTER — Other Ambulatory Visit: Payer: Medicare Other

## 2020-08-25 ENCOUNTER — Other Ambulatory Visit: Payer: Self-pay

## 2020-08-25 DIAGNOSIS — C61 Malignant neoplasm of prostate: Secondary | ICD-10-CM | POA: Diagnosis not present

## 2020-08-26 LAB — PSA: Prostate Specific Ag, Serum: <0.1 ng/mL (ref 0.0–4.0)

## 2020-08-28 NOTE — Progress Notes (Signed)
Letter sent.

## 2020-09-07 ENCOUNTER — Ambulatory Visit (INDEPENDENT_AMBULATORY_CARE_PROVIDER_SITE_OTHER): Payer: Medicare Other | Admitting: Cardiovascular Disease

## 2020-09-07 ENCOUNTER — Other Ambulatory Visit: Payer: Self-pay

## 2020-09-07 ENCOUNTER — Encounter: Payer: Self-pay | Admitting: Cardiovascular Disease

## 2020-09-07 VITALS — BP 157/86 | HR 70 | Ht 69.0 in | Wt 230.2 lb

## 2020-09-07 DIAGNOSIS — I83013 Varicose veins of right lower extremity with ulcer of ankle: Secondary | ICD-10-CM

## 2020-09-07 DIAGNOSIS — M069 Rheumatoid arthritis, unspecified: Secondary | ICD-10-CM

## 2020-09-07 DIAGNOSIS — G4733 Obstructive sleep apnea (adult) (pediatric): Secondary | ICD-10-CM

## 2020-09-07 DIAGNOSIS — L97311 Non-pressure chronic ulcer of right ankle limited to breakdown of skin: Secondary | ICD-10-CM

## 2020-09-07 DIAGNOSIS — I87099 Postthrombotic syndrome with other complications of unspecified lower extremity: Secondary | ICD-10-CM

## 2020-09-07 DIAGNOSIS — I2729 Other secondary pulmonary hypertension: Secondary | ICD-10-CM | POA: Diagnosis not present

## 2020-09-07 DIAGNOSIS — J449 Chronic obstructive pulmonary disease, unspecified: Secondary | ICD-10-CM

## 2020-09-07 DIAGNOSIS — Z86718 Personal history of other venous thrombosis and embolism: Secondary | ICD-10-CM | POA: Diagnosis not present

## 2020-09-07 DIAGNOSIS — I50810 Right heart failure, unspecified: Secondary | ICD-10-CM

## 2020-09-07 DIAGNOSIS — Z7901 Long term (current) use of anticoagulants: Secondary | ICD-10-CM | POA: Diagnosis not present

## 2020-09-07 NOTE — Progress Notes (Signed)
Cardiology Office Note   Date:  09/07/2020   ID:  [name], DOB 1951/09/19, MRN [id number]  Patient Location: Home Provider Location: Office  PCP:  [name], MD  Cardiologist:  [name], MD  Electrophysiologist:  None   Evaluation Performed:  Follow-Up Visit  Chief Complaint:  edema  History of Present Illness:    [name] is a 69 y.o. male with postphlebitic syndrome, complicated by cellulitis of the right leg, including episode of sepsis with Serratia a couple of years ago.  He is on immunosuppressive drugs for rheumatoid arthritis (Actemra).  He has developed another small ulceration above the medial malleolus of his right lower extremity.  It is about 4 cm long and 2 cm wide and draining small amounts of clear fluid.  He has not had any fever, chills, redness warmth surrounding the area.  He readily admits that the reason this is come back as he stopped wearing stockings/elastic wraps.  He previously had to go to the wound center to take care of such an ulceration, believes that he is able to heal with the same methods at home by himself.  He has not had any chest pain, palpitations, dizziness, syncope, cough hemoptysis.  He has chronic mild dyspnea and occasional wheezing which she attributes to COPD.  He continues to be 100% compliant with CPAP and denies daytime hypersomnolence.  He does his best to be compliant with sodium restriction.  His edema is partly related to right heart failure/cor pulmonale.    He had COVID-19 infection last November and was hospitalized at [facility] for about a week.  He does not remember a lot about the hospitalization but he seems to have recovered fully.  He has also received 2 shots of COVID-19 vaccine and is eager to receive the booster once this is offered.  He has mild hypercholesterolemia with mildly elevated LDL but also with a very high HDL cholesterol level.  He does not have known coronary or  peripheral vascular disease.  He is on chronic warfarin anticoagulation without serious bleeding complications, monitored in [facility].  He had normal perfusion on a nuclear stress test in March 2020.  LVEF was reported at 48%, suspected to be artificially low due to diaphragmatic interference.  An echocardiogram performed in June 9 during his recent hospitalization showed a left ventricular ejection fraction of 55-60% and normal diastolic function, no significant valvular abnormalities.     Past Medical History:  Diagnosis Date  . Anxiety    hx of   . Arthritis    RA  . Asthma   . CHF (congestive heart failure) ([place])   . Clotting disorder (HCC)    DVT both legs   . COPD (chronic obstructive pulmonary disease) ([place])   . DDD (degenerative disc disease), cervical    with UE's paresthesias  . Diverticulosis   . DVT, lower extremity ([place])    bilat  . 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 GERD (gastroesophageal reflux disease)   . Gout   . Heart murmur   . History of measles   . History of shingles   . Hypercholesterolemia   . Hypertension   . IBS (irritable bowel syndrome)   . IBS (irritable bowel syndrome)   . Peripheral edema   . Pneumonia    hx of   . Prostate cancer ([place])   . Seizures ([place])  last seizure 20 years ago; on dilantin. Unknown origin.  [name] Shortness of breath dyspnea    exertion   . Sleep apnea    not on cpap  . Tubular adenoma of colon 04/2008   Past Surgical History:  Procedure Laterality Date  . CHOLECYSTECTOMY    . COLONOSCOPY    . CYSTOSCOPY WITH LITHOLAPAXY N/A 03/17/2019   Procedure: CYSTOSCOPY WITH LITHOLAPAXY AND REMOVAL OF FOREIGN BODY;  Surgeon: [name], MD;  Location: [facility];  Service: Urology;  Laterality: N/A;  . DOPPLER ECHOCARDIOGRAPHY N/A 01-21-2012   TECHNICALLY DIFFICULT. MILD CONCENTRIC LV HYPERTROPHY. LV CAVITY IS SMALL.. EF=> 55%. TRANSMITRAL SPECTRAL  FLOW PATTREN IS SUGGESTIVE OF IMPAIRED LV RELAXATION. RV SYSTOLIC PRESSURE IS 75ZWCH. LEFT ATRIAL SIZE IS NORMAL. AV APPEARS MILDLY SCLEROTIC. NO SIGN VALVE DISEASE NOTED.  [name] LYMPHADENECTOMY Bilateral 08/30/2015   Procedure: PELVIC LYMPHADENECTOMY;  Surgeon: [name], MD;  Location: [facility];  Service: Urology;  Laterality: Bilateral;  . NUCLEAR STRESS TEST N/A 01-21-2012   NORMAL PATTERN OF PERFUSION IN ALL REGIONS. POST STRESS LV SIZE IS NORMAL. NO EVIDENCE OF INDUCIBLE ISCHEMIA. EF 54%.  [name] POLYPECTOMY    . PROSTATE BIOPSY    . ROBOT ASSISTED LAPAROSCOPIC RADICAL PROSTATECTOMY N/A 08/30/2015   Procedure: ROBOTIC ASSISTED LAPAROSCOPIC RADICAL PROSTATECTOMY;  Surgeon: [name], MD;  Location: [facility];  Service: Urology;  Laterality: N/A;  . US VENOUS LOWER EXT Right 03/07/11   PERSISTENT DVT IN RIGHT LOWER EXT.WITH PERSISTANT VISUALIZATION OF HYPOECHOIC THROMBUS WITHIN THE FEMORAL, PROFUNDA FEMORAL AND POPLITEAL VEINS. WHEN COMPARED TO PREVIOUS, CLOT IS NO LONGER IDENTIFIED WITH IN THE RIGHT CFV.     Current Meds  Medication Sig  . acetaminophen (TYLENOL) 325 MG tablet Take 2 tablets (650 mg total) by mouth every 6 (six) hours as needed for mild pain, fever or headache.  . albuterol (PROVENTIL) (2.5 MG/3ML) 0.083% nebulizer solution Take 3 mLs (2.5 mg total) by nebulization every 6 (six) hours as needed for wheezing or shortness of breath.  [name] alendronate (FOSAMAX) 70 MG tablet TAKE WITH A FULL GLASS OF WATER ON AN EMPTY STOMACH  . allopurinol (ZYLOPRIM) 100 MG tablet TAKE 1 TABLET BY MOUTH TWICE A DAY  . BREO ELLIPTA 100-25 MCG/INH AEPB USE 1 INHALATION ORALLY    DAILY  . Calcium-Phosphorus-Vitamin D (CITRACAL +D3 PO) Take 2 tablets by mouth 2 (two) times daily.   . diclofenac Sodium (VOLTAREN) 1 % GEL Apply 2-4 grams to affected joint 4 times daily as needed.  . dicyclomine (BENTYL) 10 MG capsule TAKE 1 CAPSULE BY MOUTH 4 TIMES DAILY BEFORE MEALS AND AT BEDTIME (Patient taking  differently: Take 20 mg by mouth 2 (two) times daily. )  . folic acid (FOLVITE) 1 MG tablet TAKE 2 TABLETS BY MOUTH EVERY MORNING  . furosemide (LASIX) 40 MG tablet TAKE 1 TABLET BY MOUTH TWICE A DAY  . INCRUSE ELLIPTA 62.5 MCG/INH AEPB TAKE 1 PUFF BY MOUTH EVERY DAY  . leflunomide (ARAVA) 20 MG tablet TAKE 1 TABLET BY MOUTH EVERY DAY  . omeprazole (PRILOSEC) 40 MG capsule Take 40 mg by mouth at bedtime.   . phenytoin (DILANTIN) 100 MG ER capsule Take 100-200 mg by mouth See admin instructions. Take one capsule in the morning and 2 capsules at bedtime  . Potassium Chloride ER 20 MEQ TBCR Take 20 mEq by mouth daily.   . rosuvastatin (CRESTOR) 10 MG tablet Take 10 mg by mouth daily.  . Tocilizumab (ACTEMRA IV) Inject 4  mg/kg into the vein every 28 (twenty-eight) days. For Rheumatoid Arthritis. Last ordered 06/22/20 x 2 doses  . warfarin (COUMADIN) 3 MG tablet Take 3 mg by mouth at bedtime.      Allergies:   Orencia [abatacept], Carbamazepine, Celecoxib, Cephalexin, Enbrel [etanercept], Humira [adalimumab], Levofloxacin, Sulfa antibiotics, and Sulfasalazine   Social History   Tobacco Use  . Smoking status: Former Smoker    Packs/day: 3.00    Years: 20.00    Pack years: 60.00    Types: Cigarettes    Quit date: 12/30/1996    Years since quitting: 23.7  . Smokeless tobacco: Never Used  Vaping Use  . Vaping Use: Never used  Substance Use Topics  . Alcohol use: No    Alcohol/week: 0.0 standard drinks  . Drug use: No     Family Hx: The patient's family history includes Alzheimer's disease in his mother; Bladder Cancer in his sister; Breast cancer in his sister; Colon cancer in his brother; Heart attack in his brother, brother, father, and sister; Heart murmur in his sister; Lung cancer in his brother; Skin cancer in his father; Stomach cancer in his brother and father; Stroke in his sister; Throat cancer in his brother. There is no history of Colon polyps.  ROS:   Please see the history of  present illness.    All other systems reviewed and are negative.   Prior CV studies:   The following studies were reviewed today:  Nuclear stress test March 2020, echocardiogram June 2020  Labs/Other Tests and Data Reviewed:    EKG: Is ordered today shows normal sinus rhythm, normal tracing.  Recent Labs: 11/16/2019: B Natriuretic Peptide 23.0 11/18/2019: Magnesium 2.0 07/26/2020: ALT 29; BUN 16; Creatinine, Ser 1.41; Hemoglobin 14.5; Platelets 154; Potassium 4.0; Sodium 142   Recent Lipid Panel Lab Results  Component Value Date/Time   TRIG 273 (H) 11/16/2019 09:15 AM    Wt Readings from Last 3 Encounters:  09/07/20 230 lb 3.2 oz (104.4 kg)  08/23/20 225 lb (102.1 kg)  08/04/20 234 lb (106.1 kg)     Objective:    Vital Signs:  BP (!) 157/86   Pulse 70   Ht 5\' 9"  (1.753 m)   Wt 230 lb 3.2 oz (104.4 kg)   SpO2 95%   BMI 33.99 kg/m     General: Alert, oriented x3, no distress, Moderately obese Head: no evidence of trauma, PERRL, EOMI, no exophtalmos or lid lag, no myxedema, no xanthelasma; normal ears, nose and oropharynx Neck: normal jugular venous pulsations and no hepatojugular reflux; brisk carotid pulses without delay and no carotid bruits Chest: clear to auscultation, no signs of consolidation by percussion or palpation, normal fremitus, symmetrical and full respiratory excursions Cardiovascular: normal position and quality of the apical impulse, regular rhythm, normal first and second heart sounds, no murmurs, rubs or gallops Abdomen: no tenderness or distention, no masses by palpation, no abnormal pulsatility or arterial bruits, normal bowel sounds, no hepatosplenomegaly Extremities: no clubbing, cyanosis, but he has bilateral lower extremity edema.  On the left he has 1+ edema to just above the ankle, on the right he has 2-3+ edema halfway up the pretibial area; unable to palpate pedal pulses due to the swelling.  There is a small, superficial elliptical wound with  very shallow bruising base just above the medial malleolus, longest diameter roughly 4 cm. Neurological: grossly nonfocal Psych: Normal mood and affect   ASSESSMENT & PLAN:    1. Venous stasis ulcer of right ankle  limited to breakdown of skin with varicose veins (HCC)   2. Personal history of DVT (deep vein thrombosis)   3. Complicated postphlebitic syndrome   4. Rheumatoid arthritis of hip, unspecified laterality, unspecified whether rheumatoid factor present ([place])   5. Chronic obstructive pulmonary disease, unspecified COPD type ([place])   6. OSA (obstructive sleep apnea)   7. Right heart failure due to pulmonary hypertension ([place])   8. Long term (current) use of anticoagulants      1. Peripheral venous insufficiency: He is once again developed a venous stasis ulcer.  Strongly encouraged him to start putting on the elastic wraps to allow this to heal.  Reminded him of the signs and symptoms of cellulitis or erysipelas.  He has postphlebitic syndrome after bilateral DVTs.  2. Postphlebitic syndrome/history of DVT: Lifelong anticoagulation.  3. RA: Good response to Actemra and Arava, unfortunately these medications increase the likelihood of infectious complications. 4. COPD: Intermittent problems with dyspnea but it responds promptly to bronchodilators. 5. OSA: Reports compliance with CPAP and denies daytime hypersomnolence. 6. RHF/chronic cor pulmonale: Sometimes he has additional swelling related to right heart failure exacerbation, but today there is no evidence of jugular venous distention, thyromegaly or other signs of overall fluid overload.  Suspect the swelling in his ankles is mostly due to the venous insufficiency. 7. Anticoagulation: Monitored by Dr. Hilma Favors in [place].  No bleeding complications.  COVID-19 Education: The signs and symptoms of COVID-19 were discussed with the patient and how to seek care for testing (follow up with PCP or arrange E-visit).  The importance of  social distancing was discussed today.  Time:   Today, I have spent 21 minutes with the patient with telehealth technology discussing the above problems.     Medication Adjustments/Labs and Tests Ordered: Current medicines are reviewed at length with the patient today.  Concerns regarding medicines are outlined above.   Tests Ordered: Orders Placed This Encounter  Procedures  . EKG 12-Lead    Medication Changes: No orders of the defined types were placed in this encounter.   Follow Up:  Virtual Visit or In Person 1 year  Signed, [name], MD  09/07/2020 2:00 PM    [place]

## 2020-09-07 NOTE — Patient Instructions (Signed)

## 2020-09-08 ENCOUNTER — Encounter: Payer: Self-pay | Admitting: Urology

## 2020-09-08 ENCOUNTER — Ambulatory Visit (INDEPENDENT_AMBULATORY_CARE_PROVIDER_SITE_OTHER): Payer: Medicare Other | Admitting: Urology

## 2020-09-08 VITALS — BP 154/75 | HR 73 | Temp 97.6°F | Ht 69.0 in | Wt 230.2 lb

## 2020-09-08 DIAGNOSIS — C61 Malignant neoplasm of prostate: Secondary | ICD-10-CM

## 2020-09-08 DIAGNOSIS — N5231 Erectile dysfunction following radical prostatectomy: Secondary | ICD-10-CM | POA: Diagnosis not present

## 2020-09-08 DIAGNOSIS — N3281 Overactive bladder: Secondary | ICD-10-CM | POA: Diagnosis not present

## 2020-09-08 LAB — POCT URINALYSIS DIPSTICK
Bilirubin, UA: NEGATIVE
Glucose, UA: NEGATIVE
Ketones, UA: NEGATIVE
Leukocytes, UA: NEGATIVE
Nitrite, UA: NEGATIVE
Protein, UA: POSITIVE — AB
Urobilinogen, UA: 0.2 E.U./dL
pH, UA: 5 (ref 5.0–8.0)

## 2020-09-08 MED ORDER — TADALAFIL 20 MG PO TABS
20.0000 mg | ORAL_TABLET | Freq: Every day | ORAL | 3 refills | Status: DC | PRN
Start: 2020-09-08 — End: 2021-01-18

## 2020-09-08 NOTE — Progress Notes (Signed)
09/08/2020 1:34 PM   [name] 26-Jul-1951 [id number]  Referring provider: [name], MD [address] [place],  [place] [address]  followup prostate cancer and OAB  HPI: [name] is a 69yo here for followup for prostate cancer and OAB. PSA from 08/25/2020 was undetectable. He continues to have urinary urgency and and urge incontinence. He failed toviaz, mirabegron and PTNS. He saw Dr. Matilde Sprang and was placed on oxybutyin 10mg  which slightly improved the urge incontinence. He continue to have issues getting and mainatining an erection   PMH: Past Medical History:  Diagnosis Date  . Anxiety    hx of   . Arthritis    RA  . Asthma   . CHF (congestive heart failure) ([place])   . Clotting disorder (HCC)    DVT both legs   . COPD (chronic obstructive pulmonary disease) ([facility])   . DDD (degenerative disc disease), cervical    with UE's paresthesias  . Diverticulosis   . DVT, lower extremity ([place])    bilat  . 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 GERD (gastroesophageal reflux disease)   . Gout   . Heart murmur   . History of measles   . History of shingles   . Hypercholesterolemia   . Hypertension   . IBS (irritable bowel syndrome)   . IBS (irritable bowel syndrome)   . Peripheral edema   . Pneumonia    hx of   . Prostate cancer ([place])   . Seizures ([place])    last seizure 20 years ago; on dilantin. Unknown origin.  [name] Shortness of breath dyspnea    exertion   . Sleep apnea    not on cpap  . Tubular adenoma of colon 04/2008    Surgical History: Past Surgical History:  Procedure Laterality Date  . CHOLECYSTECTOMY    . COLONOSCOPY    . CYSTOSCOPY WITH LITHOLAPAXY N/A 03/17/2019   Procedure: CYSTOSCOPY WITH LITHOLAPAXY AND REMOVAL OF FOREIGN BODY;  Surgeon: [name], MD;  Location: [facility];  Service: Urology;  Laterality: N/A;  . DOPPLER ECHOCARDIOGRAPHY N/A 01-21-2012   TECHNICALLY  DIFFICULT. MILD CONCENTRIC LV HYPERTROPHY. LV CAVITY IS SMALL.. EF=> 55%. TRANSMITRAL SPECTRAL FLOW PATTREN IS SUGGESTIVE OF IMPAIRED LV RELAXATION. RV SYSTOLIC PRESSURE IS 12INOM. LEFT ATRIAL SIZE IS NORMAL. AV APPEARS MILDLY SCLEROTIC. NO SIGN VALVE DISEASE NOTED.  [name] LYMPHADENECTOMY Bilateral 08/30/2015   Procedure: PELVIC LYMPHADENECTOMY;  Surgeon: [name], MD;  [facility];  Service: Urology;  Laterality: Bilateral;  . NUCLEAR STRESS TEST N/A 01-21-2012   NORMAL PATTERN OF PERFUSION IN ALL REGIONS. POST STRESS LV SIZE IS NORMAL. NO EVIDENCE OF INDUCIBLE ISCHEMIA. EF 54%.  [name] POLYPECTOMY    . PROSTATE BIOPSY    . ROBOT ASSISTED LAPAROSCOPIC RADICAL PROSTATECTOMY N/A 08/30/2015   Procedure: ROBOTIC ASSISTED LAPAROSCOPIC RADICAL PROSTATECTOMY;  Surgeon: [name], MD;  [facility];  Service: Urology;  Laterality: N/A;  . US VENOUS LOWER EXT Right 03/07/11   PERSISTENT DVT IN RIGHT LOWER EXT.WITH PERSISTANT VISUALIZATION OF HYPOECHOIC THROMBUS WITHIN THE FEMORAL, PROFUNDA FEMORAL AND POPLITEAL VEINS. WHEN COMPARED TO PREVIOUS, CLOT IS NO LONGER IDENTIFIED WITH IN THE RIGHT CFV.    Home Medications:  Allergies as of 09/08/2020      Reactions   Orencia [abatacept] Anaphylaxis   Had prostate cancer   Carbamazepine Rash   REACTION: makes drowsy BRAND NAME IS TEGRETOL  Celecoxib Itching, Rash   BRAND NAME IS CELEBREX   Cephalexin Rash   "Severe Rash".  BRAND NAME IS KEFLEX.    Enbrel [etanercept] Rash   Humira [adalimumab] Rash   Levofloxacin Other (See Comments)   REACTION: GI Intolerance BRAND NAME IS LEVAQUIN   Sulfa Antibiotics Rash   Sulfasalazine Rash      Medication List       Accurate as of September 08, 2020  1:34 PM. If you have any questions, ask your nurse or doctor.        acetaminophen 325 MG tablet Commonly known as: TYLENOL Take 2 tablets (650 mg total) by mouth every 6 (six) hours as needed for mild pain, fever or headache.   ACTEMRA  IV Inject 4 mg/kg into the vein every 28 (twenty-eight) days. For Rheumatoid Arthritis. Last ordered 06/22/20 x 2 doses   albuterol (2.5 MG/3ML) 0.083% nebulizer solution Commonly known as: PROVENTIL Take 3 mLs (2.5 mg total) by nebulization every 6 (six) hours as needed for wheezing or shortness of breath.   alendronate 70 MG tablet Commonly known as: FOSAMAX TAKE WITH A FULL GLASS OF WATER ON AN EMPTY STOMACH   allopurinol 100 MG tablet Commonly known as: ZYLOPRIM TAKE 1 TABLET BY MOUTH TWICE A DAY   Breo Ellipta 100-25 MCG/INH Aepb Generic drug: fluticasone furoate-vilanterol USE 1 INHALATION ORALLY    DAILY   CITRACAL +D3 PO Take 2 tablets by mouth 2 (two) times daily.   diclofenac Sodium 1 % Gel Commonly known as: VOLTAREN Apply 2-4 grams to affected joint 4 times daily as needed.   dicyclomine 10 MG capsule Commonly known as: BENTYL TAKE 1 CAPSULE BY MOUTH 4 TIMES DAILY BEFORE MEALS AND AT BEDTIME What changed: See the new instructions.   Dilantin 100 MG ER capsule Generic drug: phenytoin Take 100-200 mg by mouth See admin instructions. Take one capsule in the morning and 2 capsules at bedtime   folic acid 1 MG tablet Commonly known as: FOLVITE TAKE 2 TABLETS BY MOUTH EVERY MORNING   furosemide 40 MG tablet Commonly known as: LASIX TAKE 1 TABLET BY MOUTH TWICE A DAY   Incruse Ellipta 62.5 MCG/INH Aepb Generic drug: umeclidinium bromide TAKE 1 PUFF BY MOUTH EVERY DAY   leflunomide 20 MG tablet Commonly known as: ARAVA TAKE 1 TABLET BY MOUTH EVERY DAY   omeprazole 40 MG capsule Commonly known as: PRILOSEC Take 40 mg by mouth at bedtime.   oxybutynin 10 MG 24 hr tablet Commonly known as: DITROPAN-XL Take 10 mg by mouth daily.   Potassium Chloride ER 20 MEQ Tbcr Take 20 mEq by mouth daily.   rosuvastatin 10 MG tablet Commonly known as: CRESTOR Take 10 mg by mouth daily.   warfarin 3 MG tablet Commonly known as: COUMADIN Take 3 mg by mouth at  bedtime.       Allergies:  Allergies  Allergen Reactions  . Orencia [Abatacept] Anaphylaxis    Had prostate cancer  . Carbamazepine Rash    REACTION: makes drowsy BRAND NAME IS TEGRETOL  . Celecoxib Itching and Rash    BRAND NAME IS CELEBREX  . Cephalexin Rash    "Severe Rash".  BRAND NAME IS KEFLEX.   . Enbrel [Etanercept] Rash  . Humira [Adalimumab] Rash  . Levofloxacin Other (See Comments)    REACTION: GI Intolerance BRAND NAME IS LEVAQUIN  . Sulfa Antibiotics Rash  . Sulfasalazine Rash    Family History: Family History  Problem Relation Age of Onset  .  Skin cancer Father   . Stomach cancer Father   . Heart attack Father   . Alzheimer's disease Mother   . Throat cancer Brother   . Stomach cancer Brother   . Lung cancer Brother   . Heart attack Brother   . Heart attack Brother   . Breast cancer Sister   . Heart attack Sister   . Stroke Sister   . Bladder Cancer Sister   . Heart murmur Sister   . Colon cancer Brother   . Colon polyps Neg Hx     Social History:  reports that he quit smoking about 23 years ago. His smoking use included cigarettes. He has a 60.00 pack-year smoking history. He has never used smokeless tobacco. He reports that he does not drink alcohol and does not use drugs.  ROS: All other review of systems were reviewed and are negative except what is noted above in HPI  Physical Exam: BP (!) 154/75   Pulse 73   Temp 97.6 F (36.4 C)   Ht 5\' 9"  (1.753 m)   Wt 230 lb 3.2 oz (104.4 kg)   BMI 33.99 kg/m   Constitutional:  Alert and oriented, No acute distress. HEENT: [place] AT, moist mucus membranes.  Trachea midline, no masses. Cardiovascular: No clubbing, cyanosis, or edema. Respiratory: Normal respiratory effort, no increased work of breathing. GI: Abdomen is soft, nontender, nondistended, no abdominal masses GU: No CVA tenderness.  Lymph: No cervical or inguinal lymphadenopathy. Skin: No rashes, bruises or suspicious  lesions. Neurologic: Grossly intact, no focal deficits, moving all 4 extremities. Psychiatric: Normal mood and affect.  Laboratory Data: Lab Results  Component Value Date   WBC 5.2 07/26/2020   HGB 14.5 07/26/2020   HCT 44.6 07/26/2020   MCV 93.3 07/26/2020   PLT 154 07/26/2020    Lab Results  Component Value Date   CREATININE 1.41 (H) 07/26/2020    Lab Results  Component Value Date   PSA <0.1 02/16/2020    No results found for: TESTOSTERONE  No results found for: HGBA1C  Urinalysis    Component Value Date/Time   COLORURINE YELLOW 11/29/2018 1033   APPEARANCEUR HAZY (A) 11/29/2018 1033   LABSPEC 1.025 11/29/2018 1033   PHURINE 5.0 11/29/2018 1033   GLUCOSEU NEGATIVE 11/29/2018 1033   HGBUR MODERATE (A) 11/29/2018 1033   BILIRUBINUR negative 09/08/2020 1326   KETONESUR NEGATIVE 11/29/2018 1033   PROTEINUR Positive (A) 09/08/2020 1326   PROTEINUR 30 (A) 11/29/2018 1033   UROBILINOGEN 0.2 09/08/2020 1326   UROBILINOGEN 0.2 09/24/2012 1640   NITRITE negative 09/08/2020 1326   NITRITE NEGATIVE 11/29/2018 1033   LEUKOCYTESUR Negative 09/08/2020 1326    Lab Results  Component Value Date   BACTERIA NONE SEEN 11/29/2018    Pertinent Imaging:  Results for orders placed during the hospital encounter of 05/27/13  DG Abd 1 View  Narrative *RADIOLOGY REPORT*  Clinical Data: The mid to lower abdominal pain for 1 month with some nausea and constipation  ABDOMEN - 1 VIEW  Comparison: 03/15/2011, abdomen CT  Findings: The bowel gas pattern is within normal limits.  There is no significant increased stool.  There are changes from a prior cholecystectomy.  The soft tissues are otherwise unremarkable.  No significant bony abnormality.  IMPRESSION: No acute findings.  No obstruction.  No significant increase in stool.   Original Report Authenticated By: Lajean Manes, M.D.  Results for orders placed during the hospital encounter of 09/03/07  US VenoUS  Imaging Bilateral  Narrative Clinical Data: Bilateral lower extremity edema.  History of DVT.  Increased d-dimer. BILATERAL LOWER EXTREMITY VENOUS DOPPLER ULTRASOUND: Technique:  Gray-scale sonography with compression, as well as color and duplex Doppler ultrasound, were performed to evaluate the deep venous system from the level of the common femoral vein through the popliteal and proximal calf veins. Findings:  There is no evidence of acute DVT.  Normal compressibility phasicity and augmentation of the deep veins bilaterally.  There is some residual thrombus in the left popliteal and left posterior tibial veins.  Patient had findings of an acute DVT in the left popliteal and posterior tibial veins as noted on the 04/29/05 exam.  Impression Negative for acute DVT involving the lower extremities.  Provider: Mertie Clause  No results found for this or any previous visit.  No results found for this or any previous visit.  No results found for this or any previous visit.  No results found for this or any previous visit.  No results found for this or any previous visit.  No results found for this or any previous visit.   Assessment & Plan:    1. OAB (overactive bladder) -Management per Dr. Matilde Sprang - POCT urinalysis dipstick  2. Prostate cancer ([place]) -RTC 6 months with PSA  3. Erectile dysfunction -tadalafil 20mg  prn   No follow-ups on file.  Nicolette Bang, MD  [facility] [place]

## 2020-09-08 NOTE — Patient Instructions (Signed)
Prostate Cancer  The prostate is a male gland that helps make semen. Prostate cancer is when abnormal cells grow in this gland. Follow these instructions at home:  Take over-the-counter and prescription medicines only as told by your doctor.  Eat a healthy diet.  Get plenty of sleep.  Ask your doctor for help to find a support group for men with prostate cancer.  Keep all follow-up visits as told by your doctor. This is important.  If you have to go to the hospital, let your cancer doctor (oncologist) know.  Touch, hold, hug, and caress your partner to continue to show sexual feelings. Contact a doctor if:  You have trouble peeing (urinating).  You have blood in your pee (urine).  You have pain in your hips, back, or chest. Get help right away if:  You have weakness in your legs.  You lose feeling (have numbness) in your legs.  You cannot control your pee or your poop (stool).  You have trouble breathing.  You have sudden pain in your chest.  You have chills or a fever. Summary  The prostate is a male gland that helps make semen. Prostate cancer is when abnormal cells grow in this gland.  Ask your doctor for help to find a support group for men with prostate cancer.  Contact a doctor if you have problems peeing or have any new pain that you did not have before. This information is not intended to replace advice given to you by your health care provider. Make sure you discuss any questions you have with your health care provider. Document Revised: 11/28/2017 Document Reviewed: 08/26/2016 Elsevier Patient Education  2020 Elsevier Inc.  

## 2020-09-08 NOTE — Progress Notes (Signed)
Urological Symptom Review  Patient is experiencing the following symptoms: Frequent urination Hard to postpone urination Get up at night to urinate Leakage of urine Erection problems (male only)   Review of Systems  Gastrointestinal (upper)  : Negative for upper GI symptoms  Gastrointestinal (lower) : Negative for lower GI symptoms  Constitutional : Fatigue  Skin: Negative for skin symptoms  Eyes: Negative for eye symptoms  Ear/Nose/Throat : Negative for Ear/Nose/Throat symptoms  Hematologic/Lymphatic: Easy bruising  Cardiovascular : Leg swelling  Respiratory : Shortness of breath  Endocrine: Excessive thirst  Musculoskeletal: Back pain Joint pain  Neurological: Negative for neurological symptoms  Psychologic: Negative for psychiatric symptoms

## 2020-09-19 ENCOUNTER — Other Ambulatory Visit (HOSPITAL_COMMUNITY): Payer: Self-pay | Admitting: *Deleted

## 2020-09-20 ENCOUNTER — Other Ambulatory Visit: Payer: Self-pay

## 2020-09-20 ENCOUNTER — Encounter (HOSPITAL_COMMUNITY)
Admission: RE | Admit: 2020-09-20 | Discharge: 2020-09-20 | Disposition: A | Discharge status: Home / Self Care | Payer: Medicare Other | Source: Ambulatory Visit | Attending: Rheumatology | Admitting: Rheumatology

## 2020-09-20 DIAGNOSIS — Z79899 Other long term (current) drug therapy: Secondary | ICD-10-CM | POA: Insufficient documentation

## 2020-09-20 LAB — COMPREHENSIVE METABOLIC PANEL
ALT: 27 U/L (ref 0–44)
AST: 35 U/L (ref 15–41)
Albumin: 3.9 g/dL (ref 3.5–5.0)
Alkaline Phosphatase: 50 U/L (ref 38–126)
Anion gap: 10 (ref 5–15)
BUN: 14 mg/dL (ref 8–23)
CO2: 28 mmol/L (ref 22–32)
Calcium: 8.9 mg/dL (ref 8.9–10.3)
Chloride: 102 mmol/L (ref 98–111)
Creatinine, Ser: 1.31 mg/dL — ABNORMAL HIGH (ref 0.61–1.24)
GFR calc Af Amer: >60 mL/min (ref >60)
GFR calc non Af Amer: 55 mL/min — ABNORMAL LOW (ref >60)
Glucose, Bld: 119 mg/dL — ABNORMAL HIGH (ref 70–99)
Potassium: 4.5 mmol/L (ref 3.5–5.1)
Sodium: 140 mmol/L (ref 135–145)
Total Bilirubin: 0.7 mg/dL (ref 0.3–1.2)
Total Protein: 6.2 g/dL — ABNORMAL LOW (ref 6.5–8.1)

## 2020-09-20 LAB — CBC WITH DIFFERENTIAL/PLATELET
Abs Immature Granulocytes: 0.01 10*3/uL (ref 0.00–0.07)
Basophils Absolute: 0.1 10*3/uL (ref 0.0–0.1)
Basophils Relative: 1 %
Eosinophils Absolute: 0.7 10*3/uL — ABNORMAL HIGH (ref 0.0–0.5)
Eosinophils Relative: 12 %
HCT: 44.3 % (ref 39.0–52.0)
Hemoglobin: 14.7 g/dL (ref 13.0–17.0)
Immature Granulocytes: 0 %
Lymphocytes Relative: 19 %
Lymphs Abs: 1.1 10*3/uL (ref 0.7–4.0)
MCH: 30.6 pg (ref 26.0–34.0)
MCHC: 33.2 g/dL (ref 30.0–36.0)
MCV: 92.1 fL (ref 80.0–100.0)
Monocytes Absolute: 0.9 10*3/uL (ref 0.1–1.0)
Monocytes Relative: 15 %
Neutro Abs: 3.1 10*3/uL (ref 1.7–7.7)
Neutrophils Relative %: 53 %
Platelets: 156 10*3/uL (ref 150–400)
RBC: 4.81 MIL/uL (ref 4.22–5.81)
RDW: 13.7 % (ref 11.5–15.5)
WBC: 5.8 10*3/uL (ref 4.0–10.5)
nRBC: 0 % (ref 0.0–0.2)

## 2020-09-20 MED ORDER — ACETAMINOPHEN 325 MG PO TABS: 650.0000 mg | ORAL_TABLET | ORAL | Status: DC

## 2020-09-20 MED ORDER — DIPHENHYDRAMINE HCL 25 MG PO CAPS: 25.0000 mg | ORAL_CAPSULE | ORAL | Status: DC

## 2020-09-20 MED ORDER — TOCILIZUMAB 400 MG/20ML IV SOLN
400.0000 mg | INTRAVENOUS | Status: DC
  Administered 2020-09-20: 400 mg via INTRAVENOUS
  Filled 2020-09-20: qty 20

## 2020-09-20 NOTE — Progress Notes (Signed)
CBC within normal limits.  CMP stable and serum creatinine trending down.  Continue Actemra and Arava.

## 2020-09-25 ENCOUNTER — Other Ambulatory Visit: Payer: Self-pay | Admitting: Physician Assistant

## 2020-09-25 NOTE — Telephone Encounter (Signed)
Last visit: 08/04/2020 Next Visit: 01/12/2021 Labs: 09/20/2020 CBC within normal limits. CMP stable and serum creatinine trending down  Current Dose per office note on 08/04/2020: allopurinol 200 mg by mouth daily.  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

## 2020-09-26 DIAGNOSIS — N3946 Mixed incontinence: Secondary | ICD-10-CM | POA: Diagnosis not present

## 2020-09-26 DIAGNOSIS — R350 Frequency of micturition: Secondary | ICD-10-CM | POA: Diagnosis not present

## 2020-09-28 DIAGNOSIS — K219 Gastro-esophageal reflux disease without esophagitis: Secondary | ICD-10-CM | POA: Diagnosis not present

## 2020-09-28 DIAGNOSIS — M1991 Primary osteoarthritis, unspecified site: Secondary | ICD-10-CM | POA: Diagnosis not present

## 2020-09-28 DIAGNOSIS — J449 Chronic obstructive pulmonary disease, unspecified: Secondary | ICD-10-CM | POA: Diagnosis not present

## 2020-09-28 DIAGNOSIS — E6609 Other obesity due to excess calories: Secondary | ICD-10-CM | POA: Diagnosis not present

## 2020-10-04 DIAGNOSIS — Z6833 Body mass index (BMI) 33.0-33.9, adult: Secondary | ICD-10-CM | POA: Diagnosis not present

## 2020-10-04 DIAGNOSIS — H02826 Cysts of left eye, unspecified eyelid: Secondary | ICD-10-CM | POA: Diagnosis not present

## 2020-10-04 DIAGNOSIS — E6609 Other obesity due to excess calories: Secondary | ICD-10-CM | POA: Diagnosis not present

## 2020-10-04 DIAGNOSIS — L03115 Cellulitis of right lower limb: Secondary | ICD-10-CM | POA: Diagnosis not present

## 2020-10-14 ENCOUNTER — Other Ambulatory Visit: Payer: Self-pay | Admitting: Physician Assistant

## 2020-10-16 ENCOUNTER — Telehealth: Payer: Self-pay

## 2020-10-16 NOTE — Telephone Encounter (Signed)
Patient called stating his Actemra infusion was cancelled due to being prescribed antibiotics for an infection.  Patient is requesting a return call.

## 2020-10-16 NOTE — Telephone Encounter (Signed)
Patient states he was seen by PCP due to a wound that came up on his leg. Patient was placed on antibiotics. Patient advised he would need clearance from PCP to resume Orencia infusions. Patient advised he would need to complete antibiotics and the wound would need to be healed prior to resuming. Patient expressed understanding.

## 2020-10-18 ENCOUNTER — Inpatient Hospital Stay (HOSPITAL_COMMUNITY): Admission: RE | Admit: 2020-10-18 | Payer: Medicare Other | Source: Ambulatory Visit

## 2020-10-23 ENCOUNTER — Other Ambulatory Visit: Payer: Self-pay | Admitting: Rheumatology

## 2020-10-23 MED ORDER — PREDNISONE 5 MG PO TABS: ORAL_TABLET | ORAL | 0 refills | Status: DC

## 2020-10-23 NOTE — Telephone Encounter (Signed)
Patient states he was due for an infusion 10/18/2020. He states he cancelled his appointment due to a wound on his leg that is slow healing. Patient states he began hurting on 10/20/2020 and the pain has progressively gotten worse. Patient states the pain in his neck is so bad he can't turn his neck. Patient states he took Prednisone 20 mg on Saturday and then 30 mg of Prednisone on Sunday. Patient has not had relief. Patient has taken Tylenol Extra Strength with no relief. Patient is also on Arava 20 mg daily. Please advise.

## 2020-10-23 NOTE — Telephone Encounter (Signed)
Patient states that the wound is not completely healed yet.  Have advised him to get a clearance from his treating physician to get the next infusion when he is ready.  Please call in prednisone 20 mg p.o. daily and taper by 5 mg every week.  Side effects of the medication were discussed with the patient.

## 2020-10-23 NOTE — Telephone Encounter (Signed)
Patient calling because he has been having severe neck pain since Friday. Pain now going down right arm. NKI. Patient took 30 mg Prednisone yesterday with no relief. Please call patient to advise.

## 2020-10-24 ENCOUNTER — Telehealth: Payer: Self-pay | Admitting: Rheumatology

## 2020-10-24 ENCOUNTER — Emergency Department (HOSPITAL_COMMUNITY)
Admission: EM | Admit: 2020-10-24 | Discharge: 2020-10-24 | Disposition: A | Discharge status: Home / Self Care | Payer: Medicare Other | Attending: Emergency Medicine | Admitting: Emergency Medicine

## 2020-10-24 ENCOUNTER — Other Ambulatory Visit: Payer: Self-pay

## 2020-10-24 ENCOUNTER — Telehealth: Payer: Self-pay

## 2020-10-24 ENCOUNTER — Encounter (HOSPITAL_COMMUNITY): Payer: Self-pay | Admitting: Emergency Medicine

## 2020-10-24 DIAGNOSIS — Z8546 Personal history of malignant neoplasm of prostate: Secondary | ICD-10-CM | POA: Diagnosis not present

## 2020-10-24 DIAGNOSIS — J449 Chronic obstructive pulmonary disease, unspecified: Secondary | ICD-10-CM | POA: Diagnosis not present

## 2020-10-24 DIAGNOSIS — Z79899 Other long term (current) drug therapy: Secondary | ICD-10-CM | POA: Insufficient documentation

## 2020-10-24 DIAGNOSIS — I110 Hypertensive heart disease with heart failure: Secondary | ICD-10-CM | POA: Diagnosis not present

## 2020-10-24 DIAGNOSIS — Z87891 Personal history of nicotine dependence: Secondary | ICD-10-CM | POA: Insufficient documentation

## 2020-10-24 DIAGNOSIS — I5032 Chronic diastolic (congestive) heart failure: Secondary | ICD-10-CM | POA: Diagnosis not present

## 2020-10-24 DIAGNOSIS — Z7901 Long term (current) use of anticoagulants: Secondary | ICD-10-CM | POA: Diagnosis not present

## 2020-10-24 DIAGNOSIS — M542 Cervicalgia: Secondary | ICD-10-CM | POA: Diagnosis present

## 2020-10-24 DIAGNOSIS — M436 Torticollis: Secondary | ICD-10-CM

## 2020-10-24 DIAGNOSIS — J45909 Unspecified asthma, uncomplicated: Secondary | ICD-10-CM | POA: Insufficient documentation

## 2020-10-24 MED ORDER — METHOCARBAMOL 500 MG PO TABS
500.0000 mg | ORAL_TABLET | Freq: Once | ORAL | Status: AC
  Administered 2020-10-24: 500 mg via ORAL
  Filled 2020-10-24: qty 1

## 2020-10-24 MED ORDER — TRAMADOL HCL 50 MG PO TABS: 50.0000 mg | ORAL_TABLET | Freq: Two times a day (BID) | ORAL | 0 refills | Status: DC | PRN

## 2020-10-24 MED ORDER — METHOCARBAMOL 500 MG PO TABS: 500.0000 mg | ORAL_TABLET | Freq: Two times a day (BID) | ORAL | 0 refills | Status: DC

## 2020-10-24 NOTE — Telephone Encounter (Signed)
Patient called stating the Prednisone is helping a little bit, but he is still experiencing a lot of pain in his neck.  Patient states it "feels like someone is sticking a knife in his neck."  Patient is requesting pain medication.  Patient requested a return call ASAP.

## 2020-10-24 NOTE — ED Provider Notes (Signed)
[facility] EMERGENCY DEPARTMENT Provider Note   CSN: [id number] Arrival date & time: 10/24/20  2103     History Chief Complaint  Patient presents with  . neck pain    [name] is a 69 y.o. male with PMHx RA who presents to the ED today with complaint of gradual onset, constant, achy, left sided neck pain x 5 days. No injury to the neck. Pt reports he noticed it early in the morning 5 days ago and it has progressively been getting worse. He reports exacerbated pain with movement of his neck. He has been wearing a soft neck brace for comfort. He called his rheumatologist who prescribed him tramadol without relief. Pt called back and was started on a prednisone taper as he was scheduled for a Actemra infusion however he is dealing with a wound of his right leg and is currently on antibiotics. He started taking the prednisone yesterday without relief. Pt called back today and was told to go to the ED for further evaluation by after hours on call provider. He denies any weakness, numbness, tingling, fevers, chills, headache, rash, or any other associated symptoms.   The history is provided by the patient and medical records.       Past Medical History:  Diagnosis Date  . Anxiety    hx of   . Arthritis    RA  . Asthma   . CHF (congestive heart failure) ([place])   . Clotting disorder (HCC)    DVT both legs   . COPD (chronic obstructive pulmonary disease) ([place])   . DDD (degenerative disc disease), cervical    with UE's paresthesias  . Diverticulosis   . DVT, lower extremity ([place])    bilat  . 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 GERD (gastroesophageal reflux disease)   . Gout   . Heart murmur   . History of measles   . History of shingles   . Hypercholesterolemia   . Hypertension   . IBS (irritable bowel syndrome)   . IBS (irritable bowel syndrome)   . Peripheral edema   . Pneumonia    hx of   . Prostate  cancer ([place])   . Seizures ([place])    last seizure 20 years ago; on dilantin. Unknown origin.  [name] Shortness of breath dyspnea    exertion   . Sleep apnea    not on cpap  . Tubular adenoma of colon 04/2008    Patient Active Problem List   Diagnosis Date Noted  . Erectile dysfunction after radical prostatectomy 09/08/2020  . OAB (overactive bladder) 02/23/2020  . Acute hypoxemic respiratory failure due to COVID-19 ([place]) 11/17/2019  . CAP (community acquired pneumonia) 11/16/2019  . Acute respiratory failure with hypoxia ([place]) 11/16/2019  . Pneumonia due to COVID-19 virus 11/16/2019  . Class 2 severe obesity due to excess calories with serious comorbidity and body mass index (BMI) of 35.0 to 35.9 in adult [facility])   . Chronic diastolic HF (heart failure) ([place])   . Serratia sepsis ([place]) 06/08/2019  . Bacteremia due to Gram-negative bacteria 06/08/2019  . Sepsis due to cellulitis ([place]) 06/07/2019  . Voice hoarseness 04/26/2019  . OSA (obstructive sleep apnea) 03/10/2019  . Abnormal CT of the chest 02/09/2019  . Dyspnea on exertion 10/22/2017  . History of seizure disorder 02/27/2017  . Primary osteoarthritis of both hands 02/27/2017  . Primary osteoarthritis of both feet 02/27/2017  .  Idiopathic gout of multiple sites 02/27/2017  . High risk medication use 12/29/2016  . History of prostate cancer 12/29/2016  . Primary osteoarthritis of both knees 12/29/2016  . Chronic idiopathic gout involving toe without tophus 12/29/2016  . History of CHF (congestive heart failure) 12/29/2016  . History of COPD 12/29/2016  . Plantar pustular psoriasis 12/29/2016  . DVT (deep venous thrombosis) ([place]) 10/31/2016  . COPD with acute exacerbation ([place]) 10/31/2016  . CHF (congestive heart failure) ([place]) 10/31/2016  . Prostate cancer ([place]) 08/30/2015  . Malignant neoplasm of prostate ([place]) 07/04/2015  . Chest pain at rest 07/05/2014  . Weakness 07/05/2014  . Complicated postphlebitic syndrome 11/16/2013    . Personal history of DVT (deep vein thrombosis) 05/18/2013  . Chronic anticoagulation 05/18/2013  . Diverticulosis of colon without hemorrhage 05/18/2013  . Rheumatoid arthritis ([place]) 05/18/2013  . Seizure disorder ([place]) 05/18/2013  . Hx of adenomatous colonic polyps 05/18/2013  . GERD 05/08/2010  . NAUSEA 05/08/2010  . FLATULENCE-GAS-BLOATING 05/08/2010    Past Surgical History:  Procedure Laterality Date  . CHOLECYSTECTOMY    . COLONOSCOPY    . CYSTOSCOPY WITH LITHOLAPAXY N/A 03/17/2019   Procedure: CYSTOSCOPY WITH LITHOLAPAXY AND REMOVAL OF FOREIGN BODY;  Surgeon: [name], MD;  Location: [facility];  Service: Urology;  Laterality: N/A;  . DOPPLER ECHOCARDIOGRAPHY N/A 01-21-2012   TECHNICALLY DIFFICULT. MILD CONCENTRIC LV HYPERTROPHY. LV CAVITY IS SMALL.. EF=> 55%. TRANSMITRAL SPECTRAL FLOW PATTREN IS SUGGESTIVE OF IMPAIRED LV RELAXATION. RV SYSTOLIC PRESSURE IS 97QBHA. LEFT ATRIAL SIZE IS NORMAL. AV APPEARS MILDLY SCLEROTIC. NO SIGN VALVE DISEASE NOTED.  [name] LYMPHADENECTOMY Bilateral 08/30/2015   Procedure: PELVIC LYMPHADENECTOMY;  Surgeon: [name], MD;  Location: [facility];  Service: Urology;  Laterality: Bilateral;  . NUCLEAR STRESS TEST N/A 01-21-2012   NORMAL PATTERN OF PERFUSION IN ALL REGIONS. POST STRESS LV SIZE IS NORMAL. NO EVIDENCE OF INDUCIBLE ISCHEMIA. EF 54%.  [name] POLYPECTOMY    . PROSTATE BIOPSY    . ROBOT ASSISTED LAPAROSCOPIC RADICAL PROSTATECTOMY N/A 08/30/2015   Procedure: ROBOTIC ASSISTED LAPAROSCOPIC RADICAL PROSTATECTOMY;  Surgeon: [name], MD;  Location: [facility];  Service: Urology;  Laterality: N/A;  . US VENOUS LOWER EXT Right 03/07/11   PERSISTENT DVT IN RIGHT LOWER EXT.WITH PERSISTANT VISUALIZATION OF HYPOECHOIC THROMBUS WITHIN THE FEMORAL, PROFUNDA FEMORAL AND POPLITEAL VEINS. WHEN COMPARED TO PREVIOUS, CLOT IS NO LONGER IDENTIFIED WITH IN THE RIGHT CFV.       Family History  Problem Relation Age of Onset  . Skin cancer Father   .  Stomach cancer Father   . Heart attack Father   . Alzheimer's disease Mother   . Throat cancer Brother   . Stomach cancer Brother   . Lung cancer Brother   . Heart attack Brother   . Heart attack Brother   . Breast cancer Sister   . Heart attack Sister   . Stroke Sister   . Bladder Cancer Sister   . Heart murmur Sister   . Colon cancer Brother   . Colon polyps Neg Hx     Social History   Tobacco Use  . Smoking status: Former Smoker    Packs/day: 3.00    Years: 20.00    Pack years: 60.00    Types: Cigarettes    Quit date: 12/30/1996    Years since quitting: 23.8  . Smokeless tobacco: Never Used  Vaping Use  . Vaping Use: Never used  Substance Use Topics  . Alcohol use: No  Alcohol/week: 0.0 standard drinks  . Drug use: No    Home Medications Prior to Admission medications   Medication Sig Start Date End Date Taking? Authorizing Provider  acetaminophen (TYLENOL) 325 MG tablet Take 2 tablets (650 mg total) by mouth every 6 (six) hours as needed for mild pain, fever or headache. 11/23/19  Yes Elgergawy, Silver Huguenin, MD  albuterol (PROVENTIL) (2.5 MG/3ML) 0.083% nebulizer solution Take 3 mLs (2.5 mg total) by nebulization every 6 (six) hours as needed for wheezing or shortness of breath. 11/23/19  Yes Elgergawy, Silver Huguenin, MD  alendronate (FOSAMAX) 70 MG tablet TAKE WITH A FULL GLASS OF WATER ON AN EMPTY STOMACH Patient taking differently: Take 70 mg by mouth once a week. Take with a full glass of water on an empty stomach. 06/02/20  Yes Deveshwar, Abel Presto, MD  allopurinol (ZYLOPRIM) 100 MG tablet TAKE 1 TABLET BY MOUTH TWICE A DAY 09/25/20  Yes Deveshwar, Abel Presto, MD  BREO ELLIPTA 100-25 MCG/INH AEPB USE 1 INHALATION ORALLY    DAILY Patient taking differently: Inhale 1 puff into the lungs daily.  05/25/20  Yes Rigoberto Noel, MD  Calcium-Phosphorus-Vitamin D (CITRACAL +D3 PO) Take 2 tablets by mouth 2 (two) times daily.    Yes [provider]  ciprofloxacin (CIPRO) 500 MG  tablet Take 500 mg by mouth 2 (two) times daily. 10/16/20  Yes [provider]  diclofenac Sodium (VOLTAREN) 1 % GEL Apply 2-4 grams to affected joint 4 times daily as needed. 03/02/20  Yes Ofilia Neas, PA-C  dicyclomine (BENTYL) 10 MG capsule TAKE 1 CAPSULE BY MOUTH 4 TIMES DAILY BEFORE MEALS AND AT BEDTIME Patient taking differently: Take 20 mg by mouth 2 (two) times daily.  08/11/17  Yes Ladene Artist, MD  folic acid (FOLVITE) 1 MG tablet TAKE 2 TABLETS BY MOUTH EVERY MORNING 01/05/20  Yes Deveshwar, Abel Presto, MD  furosemide (LASIX) 40 MG tablet TAKE 1 TABLET BY MOUTH TWICE A DAY Patient taking differently: Take 40 mg by mouth every evening.  10/16/20  Yes Croitoru, Mihai, MD  INCRUSE ELLIPTA 62.5 MCG/INH AEPB TAKE 1 PUFF BY MOUTH EVERY DAY 10/20/19  Yes Rigoberto Noel, MD  leflunomide (ARAVA) 20 MG tablet TAKE 1 TABLET BY MOUTH EVERY DAY 08/22/20  Yes Ofilia Neas, PA-C  omeprazole (PRILOSEC) 40 MG capsule Take 40 mg by mouth at bedtime.  09/23/16  Yes [provider]  oxybutynin (DITROPAN-XL) 10 MG 24 hr tablet Take 10 mg by mouth daily. 08/18/20  Yes [provider]  phenytoin (DILANTIN) 100 MG ER capsule Take 100-200 mg by mouth See admin instructions. Take one capsule in the morning and 2 capsules at bedtime   Yes [provider]  Potassium Chloride ER 20 MEQ TBCR Take 20 mEq by mouth daily.  02/24/19  Yes [provider]  predniSONE (DELTASONE) 5 MG tablet Take 4 tabs po x 7 days, 3  tabs po x 7 days, 2  tabs po x 7 days, 1  tab po x 7 days 10/23/20  Yes Deveshwar, Abel Presto, MD  rosuvastatin (CRESTOR) 10 MG tablet Take 10 mg by mouth daily. 06/14/20  Yes [provider]  tadalafil (CIALIS) 20 MG tablet Take 1 tablet (20 mg total) by mouth daily as needed for erectile dysfunction. 09/08/20  Yes McKenzie, Candee Furbish, MD  Tocilizumab (ACTEMRA IV) Inject 4 mg/kg into the vein every 28 (twenty-eight) days. For Rheumatoid Arthritis. Last ordered  06/22/20 x 2 doses   Yes [provider]  traMADol (ULTRAM) 50 MG tablet Take 1 tablet (50 mg total) by mouth 2 (two) times daily as needed. 10/24/20  Yes Ofilia Neas, PA-C  Vibegron (GEMTESA) 75 MG TABS Take 75 mg by mouth daily.   Yes [provider]  warfarin (COUMADIN) 3 MG tablet Take 3 mg by mouth at bedtime.    Yes [provider]  methocarbamol (ROBAXIN) 500 MG tablet Take 1 tablet (500 mg total) by mouth 2 (two) times daily. 10/24/20   Eustaquio Maize, PA-C    Allergies    Orencia [abatacept], Carbamazepine, Celecoxib, Cephalexin, Enbrel [etanercept], Humira [adalimumab], Levofloxacin, Sulfa antibiotics, and Sulfasalazine  Review of Systems   Review of Systems  Constitutional: Negative for chills and fever.  Musculoskeletal: Positive for neck pain.  Neurological: Negative for weakness and numbness.    Physical Exam Updated Vital Signs BP (!) 169/98   Pulse 94   Temp 98.9 F (37.2 C) (Oral)   Resp 20   Ht 5\' 9"  (1.753 m)   Wt 99.8 kg   SpO2 99%   BMI 32.49 kg/m   Physical Exam Vitals and nursing note reviewed.  Constitutional:      Appearance: He is not ill-appearing.  HENT:     Head: Normocephalic and atraumatic.  Eyes:     Conjunctiva/sclera: Conjunctivae normal.  Neck:     Comments: No C midline spinal TTP. + left paracervical/trapezius musculature TTP with spasming.  Cardiovascular:     Rate and Rhythm: Normal rate and regular rhythm.     Pulses: Normal pulses.  Pulmonary:     Effort: Pulmonary effort is normal.     Breath sounds: Normal breath sounds. No wheezing, rhonchi or rales.  Musculoskeletal:     Cervical back: Tenderness present. No rigidity.     Comments: Strength and sensation equal to BUEs. Good distal pulses.   Skin:    General: Skin is warm and dry.     Coloration: Skin is not jaundiced.  Neurological:     Mental Status: He is alert.     ED Results / Procedures / Treatments   Labs (all labs ordered are  listed, but only abnormal results are displayed) Labs Reviewed - No data to display  EKG None  Radiology No results found.  Procedures Procedures (including critical care time)  Medications Ordered in ED Medications  methocarbamol (ROBAXIN) tablet 500 mg (has no administration in time range)    ED Course  I have reviewed the triage vital signs and the nursing notes.  Pertinent labs & imaging results that were available during my care of the patient were reviewed by me and considered in my medical decision making (see chart for details).    MDM Rules/Calculators/A&P                          69 year old male presenting to the ED today with complaint of left sided neck pain, atraumatic, x 5 days. Been taking tramadol without relief and started prednisone taper yesterday given hx of RA without relief. Pain exacerbated with movement of his neck. No other complaints including weakness, numbness, tingling. On exam pt is NVI. He has some muscle spasming and TTP to the left paracervical/trapezius muscle consistent with torticollis. Will provide muscle relaxer and have pt follow up with PCP. He is in agreement with plan and stable for discharge home.   This note was prepared using Dragon voice recognition software and may include unintentional dictation errors  due to the inherent limitations of voice recognition software.  Final Clinical Impression(s) / ED Diagnoses Final diagnoses:  Torticollis    Rx / DC Orders ED Discharge Orders         Ordered    methocarbamol (ROBAXIN) 500 MG tablet  2 times daily        10/24/20 2221           Discharge Instructions     Please pick up medication and take as prescribed. DO NOT DRIVE WHILE ON THIS MEDICATION AS IT CAN MAKE YOU DROWSY. I would recommend taking it at nighttime to help you sleep. Continue taking the prednisone that was prescribed to you by your rheumatologist.   Follow up with your PCP regarding your ED visit today  Return  to the ED for any worsening symptoms       Eustaquio Maize, PA-C 10/24/20 2222    Maudie Flakes, MD 10/24/20 2318

## 2020-10-24 NOTE — ED Triage Notes (Signed)
Pt arrives from home w/complaints of neck pain due to arthritis. Pt states currently on prednisone & tramadol w/out relief.

## 2020-10-24 NOTE — Discharge Instructions (Addendum)
Please pick up medication and take as prescribed. DO NOT DRIVE WHILE ON THIS MEDICATION AS IT CAN MAKE YOU DROWSY. I would recommend taking it at nighttime to help you sleep. Continue taking the prednisone that was prescribed to you by your rheumatologist.   Follow up with your PCP regarding your ED visit today  Return to the ED for any worsening symptoms

## 2020-10-24 NOTE — Telephone Encounter (Signed)
Reviewed with Dr. Estanislado Pandy. She would like to start the patient on tramadol 50 mg 1 tablet  by mouth twice daily as needed for 5 days only. Please pend prescription. I will review [place] controlled substance database.

## 2020-10-24 NOTE — Telephone Encounter (Signed)
Patient called the answering service. I returned his call. [name] stated that he has been experiencing severe pain in his neck. He tried Tramadol 50 mg, 2 tablets at 5 PM with no relief in his pain. I advised him to go the ED for evaluation. We are holding Actemra infusion due a wound on his leg. He has been on antibiotics by his PCP per patient.He was prescribed Prednisone taper yesterday as patient called that was having a severe RA flare.  Please, call to check on the patient tomorrow. Thank you, Bo Merino, MD

## 2020-10-25 DIAGNOSIS — Z23 Encounter for immunization: Secondary | ICD-10-CM | POA: Diagnosis not present

## 2020-10-25 NOTE — Telephone Encounter (Signed)
Patient states he is feeling some better. Patient states he was seen in the emergency room and was given a muscle relaxer. Patient states it is helping some. Patient states he also went to the pharmacy and got a neck collar. Patient states that is helping as well.

## 2020-10-28 DIAGNOSIS — K219 Gastro-esophageal reflux disease without esophagitis: Secondary | ICD-10-CM | POA: Diagnosis not present

## 2020-10-28 DIAGNOSIS — E6609 Other obesity due to excess calories: Secondary | ICD-10-CM | POA: Diagnosis not present

## 2020-10-28 DIAGNOSIS — M1991 Primary osteoarthritis, unspecified site: Secondary | ICD-10-CM | POA: Diagnosis not present

## 2020-10-28 DIAGNOSIS — J449 Chronic obstructive pulmonary disease, unspecified: Secondary | ICD-10-CM | POA: Diagnosis not present

## 2020-10-30 ENCOUNTER — Telehealth: Payer: Self-pay | Admitting: Rheumatology

## 2020-10-30 DIAGNOSIS — E6609 Other obesity due to excess calories: Secondary | ICD-10-CM | POA: Diagnosis not present

## 2020-10-30 DIAGNOSIS — L03115 Cellulitis of right lower limb: Secondary | ICD-10-CM | POA: Diagnosis not present

## 2020-10-30 DIAGNOSIS — Z6834 Body mass index (BMI) 34.0-34.9, adult: Secondary | ICD-10-CM | POA: Diagnosis not present

## 2020-10-30 NOTE — Telephone Encounter (Signed)
Patient calling in reference to infusion. Patient had to go off infusions because of leg problem. His leg is now healed, and he has been released to start infusions again. Please call advise.

## 2020-10-30 NOTE — Telephone Encounter (Signed)
Received note from [facility] stating patient is cleared for Infusion.

## 2020-10-31 ENCOUNTER — Telehealth: Payer: Self-pay

## 2020-10-31 ENCOUNTER — Other Ambulatory Visit: Payer: Self-pay | Admitting: Physician Assistant

## 2020-10-31 DIAGNOSIS — M1A0790 Idiopathic chronic gout, unspecified ankle and foot, without tophus (tophi): Secondary | ICD-10-CM

## 2020-10-31 DIAGNOSIS — M0579 Rheumatoid arthritis with rheumatoid factor of multiple sites without organ or systems involvement: Secondary | ICD-10-CM

## 2020-10-31 NOTE — Progress Notes (Signed)
Cleared to restart on Actemra IV infusions and due for updated orders prior to the patient scheduling the infusion. Reviewed order set with Dr. Estanislado Pandy prior to signing today in the office.   Actemra 4 mg/kg Last Visit: 08/04/20 Next Visit: 01/13/20 Labs: CBC and CMP were drawn on 09/20/20 TB Gold: Negative on 02/16/20 Cleared by [facility] provider. Ulcer has healed.   Orders placed for Actemra 400 mg every 28 days  x 3 doses along with premedication of Tylenol and Benadryl.  Standing CBC/CMP orders placed.  Hazel Sams, PA-C

## 2020-10-31 NOTE — Telephone Encounter (Signed)
Patient called to confirm Dr. Estanislado Pandy received a letter from his MD to clear him to schedule his infusion.  Patient states he would like to schedule his infusion ASAP.

## 2020-10-31 NOTE — Telephone Encounter (Signed)
I placed the orders and reviewed with Dr. Estanislado Pandy prior to signing.  Ok to schedule actemra infusion.

## 2020-11-01 NOTE — Telephone Encounter (Signed)
Attempted to contact the patient and left message for patient to advise we received the letter from his physician giving clearance for his to restart his infusion. Patient advised orders are in place and he may call to schedule.

## 2020-11-07 DIAGNOSIS — J449 Chronic obstructive pulmonary disease, unspecified: Secondary | ICD-10-CM | POA: Diagnosis not present

## 2020-11-07 DIAGNOSIS — R7309 Other abnormal glucose: Secondary | ICD-10-CM | POA: Diagnosis not present

## 2020-11-07 DIAGNOSIS — Z0001 Encounter for general adult medical examination with abnormal findings: Secondary | ICD-10-CM | POA: Diagnosis not present

## 2020-11-07 DIAGNOSIS — Z1389 Encounter for screening for other disorder: Secondary | ICD-10-CM | POA: Diagnosis not present

## 2020-11-07 DIAGNOSIS — E876 Hypokalemia: Secondary | ICD-10-CM | POA: Diagnosis not present

## 2020-11-07 DIAGNOSIS — Z6834 Body mass index (BMI) 34.0-34.9, adult: Secondary | ICD-10-CM | POA: Diagnosis not present

## 2020-11-07 DIAGNOSIS — E7849 Other hyperlipidemia: Secondary | ICD-10-CM | POA: Diagnosis not present

## 2020-11-08 ENCOUNTER — Other Ambulatory Visit: Payer: Self-pay

## 2020-11-08 ENCOUNTER — Ambulatory Visit (HOSPITAL_COMMUNITY)
Admission: RE | Admit: 2020-11-08 | Discharge: 2020-11-08 | Disposition: A | Discharge status: Home / Self Care | Payer: Medicare Other | Source: Ambulatory Visit | Attending: Rheumatology | Admitting: Rheumatology

## 2020-11-08 DIAGNOSIS — M0579 Rheumatoid arthritis with rheumatoid factor of multiple sites without organ or systems involvement: Secondary | ICD-10-CM | POA: Insufficient documentation

## 2020-11-08 LAB — COMPREHENSIVE METABOLIC PANEL
ALT: 29 U/L (ref 0–44)
AST: 32 U/L (ref 15–41)
Albumin: 3.3 g/dL — ABNORMAL LOW (ref 3.5–5.0)
Alkaline Phosphatase: 71 U/L (ref 38–126)
Anion gap: 10 (ref 5–15)
BUN: 17 mg/dL (ref 8–23)
CO2: 29 mmol/L (ref 22–32)
Calcium: 9.1 mg/dL (ref 8.9–10.3)
Chloride: 101 mmol/L (ref 98–111)
Creatinine, Ser: 1.14 mg/dL (ref 0.61–1.24)
GFR, Estimated: >60 mL/min (ref >60)
Glucose, Bld: 131 mg/dL — ABNORMAL HIGH (ref 70–99)
Potassium: 5.1 mmol/L (ref 3.5–5.1)
Sodium: 140 mmol/L (ref 135–145)
Total Bilirubin: 0.9 mg/dL (ref 0.3–1.2)
Total Protein: 6.4 g/dL — ABNORMAL LOW (ref 6.5–8.1)

## 2020-11-08 LAB — CBC WITH DIFFERENTIAL/PLATELET
Abs Immature Granulocytes: 0.05 10*3/uL (ref 0.00–0.07)
Basophils Absolute: 0.1 10*3/uL (ref 0.0–0.1)
Basophils Relative: 1 %
Eosinophils Absolute: 0.3 10*3/uL (ref 0.0–0.5)
Eosinophils Relative: 4 %
HCT: 43.7 % (ref 39.0–52.0)
Hemoglobin: 13.9 g/dL (ref 13.0–17.0)
Immature Granulocytes: 1 %
Lymphocytes Relative: 7 %
Lymphs Abs: 0.7 10*3/uL (ref 0.7–4.0)
MCH: 30.2 pg (ref 26.0–34.0)
MCHC: 31.8 g/dL (ref 30.0–36.0)
MCV: 94.8 fL (ref 80.0–100.0)
Monocytes Absolute: 0.8 10*3/uL (ref 0.1–1.0)
Monocytes Relative: 8 %
Neutro Abs: 7.3 10*3/uL (ref 1.7–7.7)
Neutrophils Relative %: 79 %
Platelets: 248 10*3/uL (ref 150–400)
RBC: 4.61 MIL/uL (ref 4.22–5.81)
RDW: 14.4 % (ref 11.5–15.5)
WBC: 9.2 10*3/uL (ref 4.0–10.5)
nRBC: 0 % (ref 0.0–0.2)

## 2020-11-08 MED ORDER — DIPHENHYDRAMINE HCL 25 MG PO CAPS: 25.0000 mg | ORAL_CAPSULE | ORAL | Status: DC

## 2020-11-08 MED ORDER — ACETAMINOPHEN 325 MG PO TABS: 650.0000 mg | ORAL_TABLET | ORAL | Status: DC

## 2020-11-08 MED ORDER — TOCILIZUMAB 400 MG/20ML IV SOLN
4.0000 mg/kg | INTRAVENOUS | Status: DC
  Administered 2020-11-08: 400 mg via INTRAVENOUS
  Filled 2020-11-08: qty 20

## 2020-11-08 NOTE — Progress Notes (Signed)
CBC WNL.  Glucose is 131.  Total protein borderline low. Albumin is borderline low.  Rest of CMP.

## 2020-11-14 ENCOUNTER — Telehealth: Payer: Self-pay

## 2020-11-14 NOTE — Telephone Encounter (Signed)
I called patient, patient was not fasting,CBC WNL.  Glucose is 131.  Total protein borderline low. Albumin is borderline low.  Rest of CMP.

## 2020-11-14 NOTE — Telephone Encounter (Signed)
Patient called stating he was returning Andrea's call regarding labwork results.  Patient was told he would receive a return call in 24-48 hours.

## 2020-11-16 ENCOUNTER — Other Ambulatory Visit: Payer: Self-pay | Admitting: Physician Assistant

## 2020-11-16 NOTE — Telephone Encounter (Signed)
Last visit: 08/04/2020 Next Visit: 01/12/2021 Labs: 11/08/2020 CBC WNL. Glucose is 131. Total protein borderline low. Albumin is borderline low. Rest of CMP.   Current Dose per office note on 08/04/2020: Arava 20 mg 1 tablet daily Dx: Rheumatoid arthritis involving multiple sites with positive rheumatoid factor   Okay to refill Arava?

## 2020-11-22 DIAGNOSIS — N3946 Mixed incontinence: Secondary | ICD-10-CM | POA: Diagnosis not present

## 2020-11-22 DIAGNOSIS — R351 Nocturia: Secondary | ICD-10-CM | POA: Diagnosis not present

## 2020-11-27 ENCOUNTER — Telehealth: Payer: Self-pay | Admitting: Pharmacist

## 2020-11-27 NOTE — Telephone Encounter (Signed)
Received fax from [facility] regarding 2022 Benefits Reverification for [name]. I am mailing Patient Consent documents to him. I've sent MyChart message as well.  Knox Saliva, PharmD, MPH Clinical Pharmacist (Rheumatology and Pulmonology)

## 2020-11-28 DIAGNOSIS — M1991 Primary osteoarthritis, unspecified site: Secondary | ICD-10-CM | POA: Diagnosis not present

## 2020-11-28 DIAGNOSIS — K219 Gastro-esophageal reflux disease without esophagitis: Secondary | ICD-10-CM | POA: Diagnosis not present

## 2020-11-28 DIAGNOSIS — E6609 Other obesity due to excess calories: Secondary | ICD-10-CM | POA: Diagnosis not present

## 2020-11-28 DIAGNOSIS — J449 Chronic obstructive pulmonary disease, unspecified: Secondary | ICD-10-CM | POA: Diagnosis not present

## 2020-11-29 ENCOUNTER — Other Ambulatory Visit: Payer: Self-pay | Admitting: Urology

## 2020-11-30 ENCOUNTER — Telehealth: Payer: Self-pay | Admitting: Cardiovascular Disease

## 2020-11-30 NOTE — Telephone Encounter (Signed)
   Primary Cardiologist: [name], MD  Chart reviewed as part of pre-operative protocol coverage. Patient was contacted 11/30/2020 in reference to pre-operative risk assessment for pending surgery as outlined below.  [name] was last seen on 09/07/20 by Dr. Sallyanne Kuster.  Since that day, DUGAN VANHOESEN has done well. He can complete 4.0 METS without angina. He had a nonischemic stress test March 2020.   Therefore, based on ACC/AHA guidelines, the patient would be at acceptable risk for the planned procedure without further cardiovascular testing.   The patient was advised that if he develops new symptoms prior to surgery to contact our office to arrange for a follow-up visit, and he verbalized understanding.  His coumadin is managed by Dr. Hilma Favors. Please reach out to his office for lovenox bridging questions.  I will route this recommendation to the requesting party via Epic fax function and remove from pre-op pool. Please call with questions.  Tami Lin Jerry Clyne, PA 11/30/2020, 5:18 PM

## 2020-11-30 NOTE — Telephone Encounter (Signed)
   [facility] Pre-operative Risk Assessment    HEARTCARE STAFF: - Please ensure there is not already an duplicate clearance open for this procedure. - Under Visit Info/Reason for Call, type in Other and utilize the format Clearance MM/DD/YY or Clearance TBD. Do not use dashes or single digits. - If request is for dental extraction, please clarify the # of teeth to be extracted.  Request for surgical clearance:  1. What type of surgery is being performed? Implantation of urinary artificial sphincter   2. When is this surgery scheduled? 01/02/2021  3. What type of clearance is required (medical clearance vs. Pharmacy clearance to hold med vs. Both)? Both  4. Are there any medications that need to be held prior to surgery and how long? Coumadin, requesting cardiology recommendation for how long  5. Practice name and name of physician performing surgery? [facility], Dr. Nicki Reaper MacDiarmid    6. What is the office phone number? [phone]   7.   What is the office fax number? [phone]  8.   Anesthesia type (None, local, MAC, general) ? general   Selena Zobro 11/30/2020, 10:35 AM  _________________________________________________________________   (provider comments below)

## 2020-12-01 ENCOUNTER — Other Ambulatory Visit: Payer: Self-pay | Admitting: Rheumatology

## 2020-12-01 NOTE — Telephone Encounter (Signed)
Last visit: 08/04/2020 Next Visit: 01/12/2021 Labs:11/08/2020 CBC WNL. Glucose is 131. Total protein borderline low. Albumin is borderline low. Rest of CMP.   Current Dose per office note on 08/04/2020:Fosamax 70 mg weekly  Dx: Age-related osteoporosis without current pathological fracture   Okay to refill Fosamax?

## 2020-12-04 ENCOUNTER — Telehealth: Payer: Self-pay

## 2020-12-04 NOTE — Telephone Encounter (Signed)
Patient states he is due for his Actemra infusion on 12/06/2020. Patient is scheduled for surgery on his bladder on 01/03/2020. Patient wants to know if he can have the infusion on 12/06/2020 and when is the soonest he can schedule to have his next infusion after surgery. Please advise.

## 2020-12-04 NOTE — Telephone Encounter (Signed)
Patient called stating he has questions regarding his infusion and requested a return call.

## 2020-12-05 NOTE — Telephone Encounter (Signed)
It is okay for him to have Actemra infusion on 12/06/2020.  We recommend having repeat infusion at least 2 weeks after the surgery if he gets clearance from the surgeon and there is no infection.

## 2020-12-05 NOTE — Telephone Encounter (Signed)
Patient advised It is okay for him to have Actemra infusion on 12/06/2020.  We recommend having repeat infusion at least 2 weeks after the surgery if he gets clearance from the surgeon and there is no infection. Patient expressed understanding.

## 2020-12-06 ENCOUNTER — Other Ambulatory Visit: Payer: Self-pay

## 2020-12-06 ENCOUNTER — Ambulatory Visit (HOSPITAL_COMMUNITY)
Admission: RE | Admit: 2020-12-06 | Discharge: 2020-12-06 | Disposition: A | Discharge status: Home / Self Care | Payer: Medicare Other | Source: Ambulatory Visit | Attending: Rheumatology | Admitting: Rheumatology

## 2020-12-06 DIAGNOSIS — M0579 Rheumatoid arthritis with rheumatoid factor of multiple sites without organ or systems involvement: Secondary | ICD-10-CM | POA: Insufficient documentation

## 2020-12-06 MED ORDER — ACETAMINOPHEN 325 MG PO TABS: 650.0000 mg | ORAL_TABLET | ORAL | Status: DC

## 2020-12-06 MED ORDER — DIPHENHYDRAMINE HCL 25 MG PO CAPS: 25.0000 mg | ORAL_CAPSULE | ORAL | Status: DC

## 2020-12-06 MED ORDER — TOCILIZUMAB 400 MG/20ML IV SOLN
400.0000 mg | INTRAVENOUS | Status: DC
  Administered 2020-12-06: 400 mg via INTRAVENOUS
  Filled 2020-12-06: qty 20

## 2020-12-12 ENCOUNTER — Other Ambulatory Visit: Payer: Self-pay | Admitting: Pulmonary Disease

## 2020-12-14 ENCOUNTER — Telehealth: Payer: Self-pay | Admitting: *Deleted

## 2020-12-14 DIAGNOSIS — Z01810 Encounter for preprocedural cardiovascular examination: Secondary | ICD-10-CM | POA: Diagnosis not present

## 2020-12-14 DIAGNOSIS — E876 Hypokalemia: Secondary | ICD-10-CM | POA: Diagnosis not present

## 2020-12-14 DIAGNOSIS — Z6835 Body mass index (BMI) 35.0-35.9, adult: Secondary | ICD-10-CM | POA: Diagnosis not present

## 2020-12-14 DIAGNOSIS — J449 Chronic obstructive pulmonary disease, unspecified: Secondary | ICD-10-CM | POA: Diagnosis not present

## 2020-12-14 DIAGNOSIS — I82409 Acute embolism and thrombosis of unspecified deep veins of unspecified lower extremity: Secondary | ICD-10-CM | POA: Diagnosis not present

## 2020-12-14 DIAGNOSIS — M069 Rheumatoid arthritis, unspecified: Secondary | ICD-10-CM | POA: Diagnosis not present

## 2020-12-14 DIAGNOSIS — G40309 Generalized idiopathic epilepsy and epileptic syndromes, not intractable, without status epilepticus: Secondary | ICD-10-CM | POA: Diagnosis not present

## 2020-12-14 NOTE — Telephone Encounter (Signed)
Patient contacted the office regarding a letter he received from [place]. Attempted to contact the patient and he was unable to hear me on the phone as his cell phone was acting up. Patient states he will call the office when he gets home.

## 2020-12-14 NOTE — Telephone Encounter (Signed)
Patient advised the genetech paperwork is for the Actemra patient assistance. Patient advised he will need to fill out the paperwork and return to [place]. Patient advised to let us know when he has completed. Patient expressed understanding.

## 2020-12-19 ENCOUNTER — Other Ambulatory Visit: Payer: Self-pay | Admitting: Rheumatology

## 2020-12-19 NOTE — Progress Notes (Signed)
Uric acid added to Medical Day orders to be drawn alongside CBC/CMP with next Actemra IV infusions.  Knox Saliva, PharmD, MPH Clinical Pharmacist (Rheumatology and Pulmonology)

## 2020-12-19 NOTE — Addendum Note (Signed)
Addended by: Cassandria Anger on: 12/19/2020 09:08 AM   Modules accepted: Orders, SmartSet

## 2020-12-19 NOTE — Telephone Encounter (Signed)
Last visit: 08/04/2020 Next Visit: 01/12/2021 Labs:11/10/2021CBC WNL. Glucose is 131. Total protein borderline low. Albumin is borderline low. Rest of CMP.  Current Dose per office note on 08/04/2020:allopurinol 200 mg by mouth daily Dx: Chronic idiopathic gout involving toe without tophus, unspecified laterality   Okay to refill Allopurinol?

## 2020-12-19 NOTE — Telephone Encounter (Signed)
Please order the uric acid level with his next lab.

## 2020-12-28 ENCOUNTER — Encounter (HOSPITAL_COMMUNITY): Admission: RE | Admit: 2020-12-28 | Payer: Medicare Other | Source: Ambulatory Visit

## 2020-12-31 ENCOUNTER — Other Ambulatory Visit: Payer: Self-pay | Admitting: Rheumatology

## 2020-12-31 ENCOUNTER — Other Ambulatory Visit: Payer: Self-pay | Admitting: Physician Assistant

## 2020-12-31 ENCOUNTER — Other Ambulatory Visit: Payer: Self-pay | Admitting: Pulmonary Disease

## 2021-01-03 ENCOUNTER — Ambulatory Visit (HOSPITAL_COMMUNITY): Payer: Medicare Other

## 2021-01-03 ENCOUNTER — Telehealth: Payer: Self-pay | Admitting: Internal Medicine

## 2021-01-03 NOTE — Telephone Encounter (Signed)
Spoke with the pt  He states copay for the Incruse went up to $400  He is requesting alternative and I have advised him to go ahead and call his insurance to inquire about covered meds  He will do this and call back  I scheduled ov with RA for 02/22/21

## 2021-01-04 ENCOUNTER — Ambulatory Visit (HOSPITAL_COMMUNITY)
Admission: RE | Admit: 2021-01-04 | Discharge: 2021-01-04 | Disposition: A | Discharge status: Home / Self Care | Payer: Medicare Other | Source: Ambulatory Visit | Attending: Rheumatology | Admitting: Rheumatology

## 2021-01-04 ENCOUNTER — Other Ambulatory Visit: Payer: Self-pay

## 2021-01-04 ENCOUNTER — Telehealth: Payer: Self-pay | Admitting: *Deleted

## 2021-01-04 DIAGNOSIS — M1A0790 Idiopathic chronic gout, unspecified ankle and foot, without tophus (tophi): Secondary | ICD-10-CM | POA: Diagnosis not present

## 2021-01-04 DIAGNOSIS — M0579 Rheumatoid arthritis with rheumatoid factor of multiple sites without organ or systems involvement: Secondary | ICD-10-CM | POA: Insufficient documentation

## 2021-01-04 LAB — CBC WITH DIFFERENTIAL/PLATELET
Abs Immature Granulocytes: 0.02 10*3/uL (ref 0.00–0.07)
Basophils Absolute: 0.1 10*3/uL (ref 0.0–0.1)
Basophils Relative: 1 %
Eosinophils Absolute: 0.5 10*3/uL (ref 0.0–0.5)
Eosinophils Relative: 9 %
HCT: 45 % (ref 39.0–52.0)
Hemoglobin: 14.4 g/dL (ref 13.0–17.0)
Immature Granulocytes: 0 %
Lymphocytes Relative: 17 %
Lymphs Abs: 1 10*3/uL (ref 0.7–4.0)
MCH: 29.9 pg (ref 26.0–34.0)
MCHC: 32 g/dL (ref 30.0–36.0)
MCV: 93.4 fL (ref 80.0–100.0)
Monocytes Absolute: 0.9 10*3/uL (ref 0.1–1.0)
Monocytes Relative: 16 %
Neutro Abs: 3.3 10*3/uL (ref 1.7–7.7)
Neutrophils Relative %: 57 %
Platelets: 150 10*3/uL (ref 150–400)
RBC: 4.82 MIL/uL (ref 4.22–5.81)
RDW: 13.9 % (ref 11.5–15.5)
WBC: 5.8 10*3/uL (ref 4.0–10.5)
nRBC: 0 % (ref 0.0–0.2)

## 2021-01-04 LAB — COMPREHENSIVE METABOLIC PANEL
ALT: 23 U/L (ref 0–44)
AST: 28 U/L (ref 15–41)
Albumin: 3.6 g/dL (ref 3.5–5.0)
Alkaline Phosphatase: 56 U/L (ref 38–126)
Anion gap: 8 (ref 5–15)
BUN: 14 mg/dL (ref 8–23)
CO2: 28 mmol/L (ref 22–32)
Calcium: 8.9 mg/dL (ref 8.9–10.3)
Chloride: 106 mmol/L (ref 98–111)
Creatinine, Ser: 1.29 mg/dL — ABNORMAL HIGH (ref 0.61–1.24)
GFR, Estimated: 60 mL/min — ABNORMAL LOW (ref >60)
Glucose, Bld: 111 mg/dL — ABNORMAL HIGH (ref 70–99)
Potassium: 4.5 mmol/L (ref 3.5–5.1)
Sodium: 142 mmol/L (ref 135–145)
Total Bilirubin: 0.7 mg/dL (ref 0.3–1.2)
Total Protein: 6 g/dL — ABNORMAL LOW (ref 6.5–8.1)

## 2021-01-04 LAB — URIC ACID: Uric Acid, Serum: 4.6 mg/dL (ref 3.7–8.6)

## 2021-01-04 MED ORDER — TOCILIZUMAB 400 MG/20ML IV SOLN
400.0000 mg | INTRAVENOUS | Status: DC
  Administered 2021-01-04: 400 mg via INTRAVENOUS
  Filled 2021-01-04: qty 20

## 2021-01-04 MED ORDER — ACETAMINOPHEN 325 MG PO TABS: 650.0000 mg | ORAL_TABLET | ORAL | Status: DC

## 2021-01-04 MED ORDER — DIPHENHYDRAMINE HCL 25 MG PO CAPS: 25.0000 mg | ORAL_CAPSULE | ORAL | Status: DC

## 2021-01-04 NOTE — Progress Notes (Signed)
Uric acid is within the desirable range.

## 2021-01-04 NOTE — Progress Notes (Signed)
Office Visit Note  Patient: [name]             Date of Birth: Jan 03, 1951           MRN: [id number]             PCP: [name], MD Referring: [name], MD Visit Date: 01/18/2021 Occupation: @GUAROCC @  Subjective:  Neuropathy in both feet   History of Present Illness: [name] is a 70 y.o. male with history of seropositive rheumatoid arthritis, osteoarthritis, and osteoporosis.  He is on Actemra 4 mg/kg IV infusions every 4 weeks and arava 20 mg 1 tablet by mouth daily.  His most recent infusion was on 01/04/21.  He denies any recent rheumatoid arthritis flares.  He denies any increased joint pain or joint swelling.  Occasional stiffness in both hands.  He has taken up woodworking as a hobby which she feels has helped with the strength in his hands.  He has been experiencing intermittent pain in both feet due to neuropathy.  He was evaluated by a podiatrist and was offered gabapentin but he declined the prescription due to the concern for drowsiness and dizziness as potential side effects.  Patient reports that his neck pain and stiffness has improved.  He has not needed to take any tramadol recently.    He denies any recent gout flares.  He continues to take allopurinol 100 mg twice daily for management of gout. He continues to take fosamax 70 mg 1 tablet by mouth once weekly.  He had a fall about 2 months ago but has not had any recent fractures.  He continues to take a calcium and vitamin D supplement.       Activities of Daily Living:  Patient reports morning stiffness for 0 minutes.   Patient Reports nocturnal pain.  Difficulty dressing/grooming: Denies Difficulty climbing stairs: Denies Difficulty getting out of chair: Denies Difficulty using hands for taps, buttons, cutlery, and/or writing: Denies  Review of Systems  Constitutional: Positive for fatigue.  HENT: Negative for mouth sores, mouth dryness and nose dryness.   Eyes: Negative for pain, itching  and dryness.  Respiratory: Positive for shortness of breath and difficulty breathing.        Due to COPD   Cardiovascular: Negative for chest pain and palpitations.  Gastrointestinal: Positive for blood in stool. Negative for constipation and diarrhea.  Endocrine: Negative for increased urination.  Genitourinary: Negative for difficulty urinating.  Musculoskeletal: Negative for arthralgias, joint pain, joint swelling, myalgias, morning stiffness, muscle tenderness and myalgias.  Skin: Negative for color change, rash and redness.  Allergic/Immunologic: Negative for susceptible to infections.  Neurological: Negative for dizziness, numbness, headaches, memory loss and weakness.  Hematological: Positive for bruising/bleeding tendency.  Psychiatric/Behavioral: Negative for confusion.    PMFS History:  Patient Active Problem List   Diagnosis Date Noted  . Erectile dysfunction after radical prostatectomy 09/08/2020  . OAB (overactive bladder) 02/23/2020  . Acute hypoxemic respiratory failure due to COVID-19 ([place]) 11/17/2019  . CAP (community acquired pneumonia) 11/16/2019  . Acute respiratory failure with hypoxia ([place]) 11/16/2019  . Pneumonia due to COVID-19 virus 11/16/2019  . Class 2 severe obesity due to excess calories with serious comorbidity and body mass index (BMI) of 35.0 to 35.9 in adult [facility])   . Chronic diastolic HF (heart failure) ([place])   . Serratia sepsis ([place]) 06/08/2019  . Bacteremia due to Gram-negative bacteria 06/08/2019  . Sepsis due to cellulitis ([place]) 06/07/2019  . Voice hoarseness  04/26/2019  . OSA (obstructive sleep apnea) 03/10/2019  . Abnormal CT of the chest 02/09/2019  . Dyspnea on exertion 10/22/2017  . History of seizure disorder 02/27/2017  . Primary osteoarthritis of both hands 02/27/2017  . Primary osteoarthritis of both feet 02/27/2017  . Idiopathic gout of multiple sites 02/27/2017  . High risk medication use 12/29/2016  . History of prostate cancer  12/29/2016  . Primary osteoarthritis of both knees 12/29/2016  . Chronic idiopathic gout involving toe without tophus 12/29/2016  . History of CHF (congestive heart failure) 12/29/2016  . History of COPD 12/29/2016  . Plantar pustular psoriasis 12/29/2016  . DVT (deep venous thrombosis) (HCC) 10/31/2016  . COPD with acute exacerbation (HCC) 10/31/2016  . CHF (congestive heart failure) (HCC) 10/31/2016  . Prostate cancer (HCC) 08/30/2015  . Malignant neoplasm of prostate (HCC) 07/04/2015  . Chest pain at rest 07/05/2014  . Weakness 07/05/2014  . Complicated postphlebitic syndrome 11/16/2013  . Personal history of DVT (deep vein thrombosis) 05/18/2013  . Chronic anticoagulation 05/18/2013  . Diverticulosis of colon without hemorrhage 05/18/2013  . Rheumatoid arthritis (HCC) 05/18/2013  . Seizure disorder (HCC) 05/18/2013  . Hx of adenomatous colonic polyps 05/18/2013  . GERD 05/08/2010  . NAUSEA 05/08/2010  . FLATULENCE-GAS-BLOATING 05/08/2010    Past Medical History:  Diagnosis Date  . Anxiety    hx of   . Arthritis    RA  . Asthma   . CHF (congestive heart failure) (HCC)   . Clotting disorder (HCC)    DVT both legs   . COPD (chronic obstructive pulmonary disease) (HCC)   . DDD (degenerative disc disease), cervical    with UE's paresthesias  . Diverticulosis   . DVT, lower extremity (HCC)    bilat  . 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 GERD (gastroesophageal reflux disease)   . Gout   . Heart murmur   . History of measles   . History of shingles   . Hypercholesterolemia   . Hypertension   . IBS (irritable bowel syndrome)   . IBS (irritable bowel syndrome)   . Peripheral edema   . Pneumonia    hx of   . Prostate cancer (HCC)   . Seizures (HCC)    last seizure 20 years ago; on dilantin. Unknown origin.  [name] Shortness of breath dyspnea    exertion   . Sleep apnea    not on cpap  . Tubular adenoma of  colon 04/2008    Family History  Problem Relation Age of Onset  . Skin cancer Father   . Stomach cancer Father   . Heart attack Father   . Alzheimer's disease Mother   . Throat cancer Brother   . Stomach cancer Brother   . Lung cancer Brother   . Heart attack Brother   . Heart attack Brother   . Breast cancer Sister   . Heart attack Sister   . Stroke Sister   . Bladder Cancer Sister   . Heart murmur Sister   . Colon cancer Brother   . Colon polyps Neg Hx    Past Surgical History:  Procedure Laterality Date  . CHOLECYSTECTOMY    . COLONOSCOPY    . CYSTOSCOPY WITH LITHOLAPAXY N/A 03/17/2019   Procedure: CYSTOSCOPY WITH LITHOLAPAXY AND REMOVAL OF FOREIGN BODY;  Surgeon: Malen Gauze, MD;  Location: [facility];  Service: Urology;  Laterality: N/A;  .  DOPPLER ECHOCARDIOGRAPHY N/A 01-21-2012   TECHNICALLY DIFFICULT. MILD CONCENTRIC LV HYPERTROPHY. LV CAVITY IS SMALL.. EF=> 55%. TRANSMITRAL SPECTRAL FLOW PATTREN IS SUGGESTIVE OF IMPAIRED LV RELAXATION. RV SYSTOLIC PRESSURE IS . LEFT ATRIAL SIZE IS NORMAL. AV APPEARS MILDLY SCLEROTIC. NO SIGN VALVE DISEASE NOTED.  [name] LYMPHADENECTOMY Bilateral 08/30/2015   Procedure: PELVIC LYMPHADENECTOMY;  Surgeon: Malen Gauze, MD;  Location: [facility];  Service: Urology;  Laterality: Bilateral;  . NUCLEAR STRESS TEST N/A 01-21-2012   NORMAL PATTERN OF PERFUSION IN ALL REGIONS. POST STRESS LV SIZE IS NORMAL. NO EVIDENCE OF INDUCIBLE ISCHEMIA. EF 54%.  [name] POLYPECTOMY    . PROSTATE BIOPSY    . ROBOT ASSISTED LAPAROSCOPIC RADICAL PROSTATECTOMY N/A 08/30/2015   Procedure: ROBOTIC ASSISTED LAPAROSCOPIC RADICAL PROSTATECTOMY;  Surgeon: Malen Gauze, MD;  Location: [facility];  Service: Urology;  Laterality: N/A;  . US VENOUS LOWER EXT Right 03/07/11   PERSISTENT DVT IN RIGHT LOWER EXT.WITH PERSISTANT VISUALIZATION OF HYPOECHOIC THROMBUS WITHIN THE FEMORAL, PROFUNDA FEMORAL AND POPLITEAL VEINS. WHEN COMPARED TO PREVIOUS, CLOT IS NO LONGER IDENTIFIED WITH  IN THE RIGHT CFV.   Social History   Social History Narrative  . Not on file   Immunization History  Administered Date(s) Administered  . Fluad Quad(high Dose 65+) 09/30/2019  . Influenza, High Dose Seasonal PF 10/05/2018  . Influenza,inj,Quad PF,6+ Mos 08/31/2015  . Influenza-Unspecified 09/30/2016, 08/22/2017  . Moderna Sars-Covid-2 Vaccination 01/21/2020, 02/18/2020, 10/25/2020  . Pneumococcal Conjugate-13 10/05/2018  . Pneumococcal-Unspecified 12/30/2012     Objective: Vital Signs: BP (!) 153/103 (BP Location: Left Arm, Patient Position: Sitting, Cuff Size: Normal)   Pulse 64   Resp 17   Ht 5\' 9"  (1.753 m)   Wt 233 lb 9.6 oz (106 kg)   BMI 34.50 kg/m    Physical Exam Vitals and nursing note reviewed.  Constitutional:      Appearance: He is well-developed and well-nourished.  HENT:     Head: Normocephalic and atraumatic.  Eyes:     Extraocular Movements: EOM normal.     Conjunctiva/sclera: Conjunctivae normal.     Pupils: Pupils are equal, round, and reactive to light.  Pulmonary:     Effort: Pulmonary effort is normal.  Abdominal:     Palpations: Abdomen is soft.  Musculoskeletal:     Cervical back: Normal range of motion and neck supple.  Skin:    General: Skin is warm and dry.     Capillary Refill: Capillary refill takes less than 2 seconds.  Neurological:     Mental Status: He is alert and oriented to person, place, and time.  Psychiatric:        Mood and Affect: Mood and affect normal.        Behavior: Behavior normal.      Musculoskeletal Exam: C-spine limited ROM with lateral rotation.  Postural thoracic kyphosis.  No midline spinal tenderness.  No SI joint tenderness.  Shoulder joints, elbow joints, and wrist joints have good ROM with no discomfort.  Synovitis of the right 1st MCP joint noted.  No tenderness or synovitis of other MCP joints.  Complete fist formation bilaterally.  Hip joints good ROM with no discomfort.  Tenderness over the right  trochanteric bursa.  Knee joints good ROM with no warmth or effusion.  Ankle joints good ROM with no tenderness. Pedal edema noted bilaterally.   CDAI Exam: CDAI Score: 0.2  Patient Global: 1 mm; Provider Global: 1 mm Swollen: 0 ; Tender: 0  Joint Exam 01/18/2021  No joint exam has been documented for this visit   There is currently no information documented on the homunculus. Go to the Rheumatology activity and complete the homunculus joint exam.  Investigation: No additional findings.  Imaging: No results found.  Recent Labs: Lab Results  Component Value Date   WBC 5.8 01/04/2021   HGB 14.4 01/04/2021   PLT 150 01/04/2021   NA 142 01/04/2021   K 4.5 01/04/2021   CL 106 01/04/2021   CO2 28 01/04/2021   GLUCOSE 111 (H) 01/04/2021   BUN 14 01/04/2021   CREATININE 1.29 (H) 01/04/2021   BILITOT 0.7 01/04/2021   ALKPHOS 56 01/04/2021   AST 28 01/04/2021   ALT 23 01/04/2021   PROT 6.0 (L) 01/04/2021   ALBUMIN 3.6 01/04/2021   CALCIUM 8.9 01/04/2021   GFRAA >60 09/20/2020   QFTBGOLDPLUS NEGATIVE 02/16/2020    Speciality Comments: ACTEMRA 4mg /kg x 4 weeks PPD negative 01/02/17  Procedures:  No procedures performed Allergies: Orencia [abatacept], Carbamazepine, Celecoxib, Cephalexin, Enbrel [etanercept], Humira [adalimumab], Levofloxacin, Sulfa antibiotics, and Sulfasalazine     Assessment / Plan:     Visit Diagnoses: Rheumatoid arthritis involving multiple sites with positive rheumatoid factor ([place]): He has mild tenderness and synovitis over the right 1st MCP joint. He is not having any other increased joint pain or joint swelling at this time.   He has taken up wood working as a hobby, which may have exacerbated his symptoms.  He is clinically doing well on Actemra 4 mg/kg IV infusions every 28 days and arava 20 mg 1 tablet by mouth daily.  His most recent infusion was on 01/04/21.  He will continue on the current treatment regimen.  He was advised to notify us if he  develops increased joint pain or joint swelling.  He will follow up in 5 months.   High risk medication use - Actemra 4mg /kg every 4 weeks (last infusion was on 01/04/21) and Arava 20 mg 1 tablet by mouth daily. CBC and CMP were updated on 01/04/21.  He has lab work drawn with infusions.  Labs were stable.  TB gold negative on 02/16/20.  Future order for TB gold placed today.   - Plan: QuantiFERON-TB Gold Plus He has not had any recent infections.  He was advised to hold actemra and arava if he develops signs or symptoms of an infection and to resume once the infection has completely cleared.  He has received 3 doses of moderna vaccine.   Screening for tuberculosis: Future order for TB gold placed today.   Chronic idiopathic gout involving toe without tophus, unspecified laterality - He has not had any recent gout flares. He takes allopurinol 100 mg BID for management of gout. He is tolerating allopurinol without any side effects.  His uric acid was 4.6 on 01/04/21. He will continue on the current treatment regimen.    Primary osteoarthritis of both hands: He has been DIP thickening consistent with osteoarthritis of both hands.  He is able to make a complete fist bilaterally.  He experiences occasional stiffness in both hands.  He is taking up woodworking as a hobby which is helped with some of his stiffness.  Joint protection and muscle strengthening were discussed.  Primary osteoarthritis of both knees: He has good range of motion in both knee joints on exam.  No warmth or effusion was noted.  He has bilateral knee crepitus.  Primary osteoarthritis of both feet: He is not experiencing any discomfort in his knee joints at  this time.  Age-related osteoporosis without current pathological fracture - He is taking Fosamax 70 mg 1 tablet by mouth once weekly (started in October 2018).  He continues to take a calcium and vitamin D supplement as recommended. Last DEXA on 09/28/2019 T-score -2.1 right femur neck  with +12 % change in BMD. He had a fall about 2 months ago off of the back of a truck but did not have any fractures. Due to update DEXA in September 2022.    Other medical conditions are listed as follows:   History of DVT (deep vein thrombosis)  History of COPD - followed by Dr. Chase Caller.  History of CHF (congestive heart failure)  History of seizure disorder  History of prostate cancer  History of adenomatous polyp of colon    Orders: Orders Placed This Encounter  Procedures  . QuantiFERON-TB Gold Plus   No orders of the defined types were placed in this encounter.     Follow-Up Instructions: Return in about 5 months (around 06/18/2021) for Rheumatoid arthritis, Osteoarthritis, Gout.   Ofilia Neas, PA-C  Note - This record has been created using Dragon software.  Chart creation errors have been sought, but may not always  have been located. Such creation errors do not reflect on  the standard of medical care.

## 2021-01-04 NOTE — Progress Notes (Signed)
CBC is normal, GFR is low and stable.  Please forward labs to his PCP.

## 2021-01-04 NOTE — Telephone Encounter (Signed)
Patient had his Actemra infusion today and was advised that they will need new order prior to next infusion.

## 2021-01-09 ENCOUNTER — Other Ambulatory Visit: Payer: Self-pay | Admitting: Pharmacist

## 2021-01-09 DIAGNOSIS — M1A0790 Idiopathic chronic gout, unspecified ankle and foot, without tophus (tophi): Secondary | ICD-10-CM

## 2021-01-09 DIAGNOSIS — M0579 Rheumatoid arthritis with rheumatoid factor of multiple sites without organ or systems involvement: Secondary | ICD-10-CM

## 2021-01-09 DIAGNOSIS — Z79899 Other long term (current) drug therapy: Secondary | ICD-10-CM

## 2021-01-09 NOTE — Progress Notes (Signed)
Patient due for updated Actemra IV infusion orders. Last infusion was 01/04/21.  Dose: Actemra 4 mg/kg every 28 days based on recorded weight of 104.3 kg (01/04/21)  Last Visit: 08/04/20 Next Visit: 01/18/21  Labs: CBC and CMP were drawn on 01/04/21 (CBC normal, GFR continues to be low but stable)  TB Gold: Negative on 02/16/20  Orders placed for Actemra 418 mg every 28 days x 3 doses along with premedication of Tylenol and Benadryl.  Standing CBC/CMP orders placed (every 2 months - due March 2022 infusion then with every other infusion) and TB gold due (yearly - to be drawn with February 2022 infusion).  Knox Saliva, PharmD, MPH Clinical Pharmacist (Rheumatology and Pulmonology)

## 2021-01-10 NOTE — Telephone Encounter (Signed)
Patient notified about Actemra infusions being placed. Provided [facility] phone number. Next infusion scheduled for 02/01/21. Pt verbalized understanding and nothing further needed.  Knox Saliva, PharmD, MPH Clinical Pharmacist (Rheumatology and Pulmonology)

## 2021-01-12 ENCOUNTER — Ambulatory Visit: Payer: Medicare Other | Admitting: Rheumatology

## 2021-01-16 ENCOUNTER — Ambulatory Visit: Admit: 2021-01-16 | Payer: Medicare Other | Admitting: Urology

## 2021-01-16 SURGERY — INSERTION, ARTIFICIAL URINARY SPHINCTER
Anesthesia: General

## 2021-01-18 ENCOUNTER — Ambulatory Visit (INDEPENDENT_AMBULATORY_CARE_PROVIDER_SITE_OTHER): Payer: Medicare Other | Admitting: Physician Assistant

## 2021-01-18 ENCOUNTER — Encounter: Payer: Self-pay | Admitting: Physician Assistant

## 2021-01-18 ENCOUNTER — Other Ambulatory Visit: Payer: Self-pay

## 2021-01-18 VITALS — BP 153/103 | HR 64 | Resp 17 | Ht 69.0 in | Wt 233.6 lb

## 2021-01-18 DIAGNOSIS — M19071 Primary osteoarthritis, right ankle and foot: Secondary | ICD-10-CM

## 2021-01-18 DIAGNOSIS — Z8546 Personal history of malignant neoplasm of prostate: Secondary | ICD-10-CM

## 2021-01-18 DIAGNOSIS — Z8669 Personal history of other diseases of the nervous system and sense organs: Secondary | ICD-10-CM | POA: Diagnosis not present

## 2021-01-18 DIAGNOSIS — M19042 Primary osteoarthritis, left hand: Secondary | ICD-10-CM

## 2021-01-18 DIAGNOSIS — M170 Bilateral primary osteoarthritis of knee: Secondary | ICD-10-CM

## 2021-01-18 DIAGNOSIS — Z86010 Personal history of colonic polyps: Secondary | ICD-10-CM

## 2021-01-18 DIAGNOSIS — Z86718 Personal history of other venous thrombosis and embolism: Secondary | ICD-10-CM

## 2021-01-18 DIAGNOSIS — Z8679 Personal history of other diseases of the circulatory system: Secondary | ICD-10-CM | POA: Diagnosis not present

## 2021-01-18 DIAGNOSIS — Z79899 Other long term (current) drug therapy: Secondary | ICD-10-CM

## 2021-01-18 DIAGNOSIS — M19041 Primary osteoarthritis, right hand: Secondary | ICD-10-CM | POA: Diagnosis not present

## 2021-01-18 DIAGNOSIS — M810 Age-related osteoporosis without current pathological fracture: Secondary | ICD-10-CM

## 2021-01-18 DIAGNOSIS — M19072 Primary osteoarthritis, left ankle and foot: Secondary | ICD-10-CM

## 2021-01-18 DIAGNOSIS — Z111 Encounter for screening for respiratory tuberculosis: Secondary | ICD-10-CM

## 2021-01-18 DIAGNOSIS — Z8709 Personal history of other diseases of the respiratory system: Secondary | ICD-10-CM

## 2021-01-18 DIAGNOSIS — M1A0790 Idiopathic chronic gout, unspecified ankle and foot, without tophus (tophi): Secondary | ICD-10-CM | POA: Diagnosis not present

## 2021-01-18 DIAGNOSIS — M0579 Rheumatoid arthritis with rheumatoid factor of multiple sites without organ or systems involvement: Secondary | ICD-10-CM

## 2021-01-27 DIAGNOSIS — M1991 Primary osteoarthritis, unspecified site: Secondary | ICD-10-CM | POA: Diagnosis not present

## 2021-01-27 DIAGNOSIS — E6609 Other obesity due to excess calories: Secondary | ICD-10-CM | POA: Diagnosis not present

## 2021-01-27 DIAGNOSIS — K219 Gastro-esophageal reflux disease without esophagitis: Secondary | ICD-10-CM | POA: Diagnosis not present

## 2021-01-27 DIAGNOSIS — J449 Chronic obstructive pulmonary disease, unspecified: Secondary | ICD-10-CM | POA: Diagnosis not present

## 2021-02-01 ENCOUNTER — Ambulatory Visit (HOSPITAL_COMMUNITY)
Admission: RE | Admit: 2021-02-01 | Discharge: 2021-02-01 | Disposition: A | Discharge status: Home / Self Care | Payer: Medicare Other | Source: Ambulatory Visit | Attending: Rheumatology | Admitting: Rheumatology

## 2021-02-01 ENCOUNTER — Other Ambulatory Visit: Payer: Self-pay

## 2021-02-01 DIAGNOSIS — M0579 Rheumatoid arthritis with rheumatoid factor of multiple sites without organ or systems involvement: Secondary | ICD-10-CM | POA: Insufficient documentation

## 2021-02-01 DIAGNOSIS — Z79899 Other long term (current) drug therapy: Secondary | ICD-10-CM | POA: Insufficient documentation

## 2021-02-01 MED ORDER — ACETAMINOPHEN 325 MG PO TABS: 650.0000 mg | ORAL_TABLET | ORAL | Status: DC

## 2021-02-01 MED ORDER — DIPHENHYDRAMINE HCL 25 MG PO CAPS: 25.0000 mg | ORAL_CAPSULE | ORAL | Status: DC

## 2021-02-01 MED ORDER — TOCILIZUMAB 400 MG/20ML IV SOLN
4.0000 mg/kg | INTRAVENOUS | Status: DC
  Administered 2021-02-01: 418 mg via INTRAVENOUS
  Filled 2021-02-01: qty 16.9

## 2021-02-03 LAB — QUANTIFERON-TB GOLD PLUS (RQFGPL)
QuantiFERON Mitogen Value: >10 IU/mL
QuantiFERON Nil Value: 0 IU/mL
QuantiFERON TB1 Ag Value: 0 IU/mL
QuantiFERON TB2 Ag Value: 0.01 IU/mL

## 2021-02-03 LAB — QUANTIFERON-TB GOLD PLUS: QuantiFERON-TB Gold Plus: NEGATIVE

## 2021-02-05 NOTE — Progress Notes (Signed)
TB gold negative

## 2021-02-06 ENCOUNTER — Other Ambulatory Visit: Payer: Self-pay | Admitting: Rheumatology

## 2021-02-06 ENCOUNTER — Other Ambulatory Visit: Payer: Self-pay | Admitting: Physician Assistant

## 2021-02-06 NOTE — Telephone Encounter (Signed)
Last Visit: 01/18/2021 Next Visit: 05/21/2021 Labs:  01/04/2021, CBC is normal, GFR is low and stable.  Current Dose per office note 01/18/2021, Arava 20 mg 1 tablet by mouth daily DX:  Rheumatoid arthritis involving multiple sites with positive rheumatoid factor   Last Fill: 11/16/2020  Okay to refill Arava?

## 2021-02-06 NOTE — Telephone Encounter (Signed)
Last Visit: 01/18/2021 Next Visit: 06/21/2021 Labs: 01/04/2021, CBC is normal, GFR is low and stable  Current Dose per office note 01/18/2021, Fosamax 70 mg 1 tablet by mouth once weekly, allopurinol 100 mg BID  DX: Age-related osteoporosis without current pathological fracture, Chronic idiopathic gout involving toe without tophus, unspecified laterality  Last Fill: Fosamax 12/01/2020 and 12/19/2020 Allopurinol  Okay to refill Fosamax and Allopurinol ?

## 2021-02-15 ENCOUNTER — Other Ambulatory Visit: Payer: Self-pay | Admitting: Urology

## 2021-02-22 ENCOUNTER — Ambulatory Visit: Payer: Medicare Other | Admitting: Pulmonary Disease

## 2021-02-28 ENCOUNTER — Other Ambulatory Visit: Payer: Self-pay

## 2021-02-28 ENCOUNTER — Other Ambulatory Visit: Payer: Medicare Other

## 2021-02-28 DIAGNOSIS — C61 Malignant neoplasm of prostate: Secondary | ICD-10-CM

## 2021-03-01 ENCOUNTER — Other Ambulatory Visit: Payer: Self-pay

## 2021-03-01 ENCOUNTER — Ambulatory Visit (HOSPITAL_COMMUNITY)
Admission: RE | Admit: 2021-03-01 | Discharge: 2021-03-01 | Disposition: A | Discharge status: Home / Self Care | Payer: Medicare Other | Source: Ambulatory Visit | Attending: Rheumatology | Admitting: Rheumatology

## 2021-03-01 DIAGNOSIS — Z79899 Other long term (current) drug therapy: Secondary | ICD-10-CM | POA: Diagnosis not present

## 2021-03-01 DIAGNOSIS — M0579 Rheumatoid arthritis with rheumatoid factor of multiple sites without organ or systems involvement: Secondary | ICD-10-CM | POA: Diagnosis not present

## 2021-03-01 LAB — CBC WITH DIFFERENTIAL/PLATELET
Abs Immature Granulocytes: 0 10*3/uL (ref 0.00–0.07)
Basophils Absolute: 0.1 10*3/uL (ref 0.0–0.1)
Basophils Relative: 1 %
Eosinophils Absolute: 0.4 10*3/uL (ref 0.0–0.5)
Eosinophils Relative: 9 %
HCT: 43.7 % (ref 39.0–52.0)
Hemoglobin: 14.4 g/dL (ref 13.0–17.0)
Immature Granulocytes: 0 %
Lymphocytes Relative: 20 %
Lymphs Abs: 0.9 10*3/uL (ref 0.7–4.0)
MCH: 29.9 pg (ref 26.0–34.0)
MCHC: 33 g/dL (ref 30.0–36.0)
MCV: 90.9 fL (ref 80.0–100.0)
Monocytes Absolute: 0.6 10*3/uL (ref 0.1–1.0)
Monocytes Relative: 13 %
Neutro Abs: 2.5 10*3/uL (ref 1.7–7.7)
Neutrophils Relative %: 57 %
Platelets: 150 10*3/uL (ref 150–400)
RBC: 4.81 MIL/uL (ref 4.22–5.81)
RDW: 13.8 % (ref 11.5–15.5)
WBC: 4.5 10*3/uL (ref 4.0–10.5)
nRBC: 0 % (ref 0.0–0.2)

## 2021-03-01 LAB — COMPREHENSIVE METABOLIC PANEL
ALT: 23 U/L (ref 0–44)
AST: 39 U/L (ref 15–41)
Albumin: 3.8 g/dL (ref 3.5–5.0)
Alkaline Phosphatase: 48 U/L (ref 38–126)
Anion gap: 9 (ref 5–15)
BUN: 13 mg/dL (ref 8–23)
CO2: 28 mmol/L (ref 22–32)
Calcium: 9.1 mg/dL (ref 8.9–10.3)
Chloride: 104 mmol/L (ref 98–111)
Creatinine, Ser: 1.32 mg/dL — ABNORMAL HIGH (ref 0.61–1.24)
GFR, Estimated: 58 mL/min — ABNORMAL LOW (ref >60)
Glucose, Bld: 128 mg/dL — ABNORMAL HIGH (ref 70–99)
Potassium: 5 mmol/L (ref 3.5–5.1)
Sodium: 141 mmol/L (ref 135–145)
Total Bilirubin: 0.6 mg/dL (ref 0.3–1.2)
Total Protein: 6 g/dL — ABNORMAL LOW (ref 6.5–8.1)

## 2021-03-01 LAB — PSA: Prostate Specific Ag, Serum: <0.1 ng/mL (ref 0.0–4.0)

## 2021-03-01 MED ORDER — SODIUM CHLORIDE 0.9 % IV SOLN
400.0000 mg | INTRAVENOUS | Status: DC
  Administered 2021-03-01: 400 mg via INTRAVENOUS
  Filled 2021-03-01: qty 20

## 2021-03-01 MED ORDER — ACETAMINOPHEN 325 MG PO TABS: 650.0000 mg | ORAL_TABLET | ORAL | Status: DC

## 2021-03-01 MED ORDER — DIPHENHYDRAMINE HCL 25 MG PO CAPS: 25.0000 mg | ORAL_CAPSULE | ORAL | Status: DC

## 2021-03-01 NOTE — Progress Notes (Signed)
Glucose is 128.  Creatinine is elevated but stable-1.32 and GFR is borderline low-58.  Please advise the patient to avoid NSAIDs.

## 2021-03-01 NOTE — Progress Notes (Addendum)
Pt reports bladder surgery 03/20/21, Dr. Estanislado Pandy aware per pt. As long as showing no symptoms of infection after surgery may continue with Actemra as reported by patient. Next appointment made.  Pt reports being told by surgeon (Dr. Matilde Sprang) with [facility] to take oral antibiotics 3 days prior to surgery.

## 2021-03-01 NOTE — Progress Notes (Signed)
CBC WNL

## 2021-03-05 NOTE — Progress Notes (Signed)
Sent via mychart

## 2021-03-06 NOTE — Progress Notes (Signed)
DUE TO COVID-19 ONLY ONE VISITOR IS ALLOWED TO COME WITH YOU AND STAY IN THE WAITING ROOM ONLY DURING PRE OP AND PROCEDURE DAY OF SURGERY. THE 1 VISITOR  MAY VISIT WITH YOU AFTER SURGERY IN YOUR PRIVATE ROOM DURING VISITING HOURS ONLY!  YOU NEED TO HAVE A COVID 19 TEST ON__3/18/2022 _____ @_______ , THIS TEST MUST BE DONE BEFORE SURGERY,  COVID TESTING SITE [address] [place] [address], IT IS ON THE RIGHT GOING OUT [address] APPROXIMATELY  2 MINUTES PAST [employer] ON THE RIGHT. ONCE YOUR COVID TEST IS COMPLETED,  PLEASE BEGIN THE QUARANTINE INSTRUCTIONS AS OUTLINED IN YOUR HANDOUT.                [name]  03/06/2021   Your procedure is scheduled on: 03/20/2021  Report to [facility] Main  Entrance   Report to admitting at    0530 AM     Call this number if you have problems the morning of surgery [phone]    Remember: Do not eat food , candy gum or mints :After Midnight. You may have clear liquids from midnight until 0430AM     CLEAR LIQUID DIET   Foods Allowed                                                                       Coffee and tea, regular and decaf                              Plain Jell-O any favor except red or purple                                            Fruit ices (not with fruit pulp)                                      Iced Popsicles                                     Carbonated beverages, regular and diet                                    Cranberry, grape and apple juices Sports drinks like Gatorade Lightly seasoned clear broth or consume(fat free) Sugar, honey syrup   _____________________________________________________________________    BRUSH YOUR TEETH MORNING OF SURGERY AND RINSE YOUR MOUTH OUT, NO CHEWING GUM CANDY OR MINTS.     Take these medicines the morning of surgery with A SIP OF WATER: NEBULIZER IF NEEDED, INHALERS AS USUAL AND BRING, ALLOPURINOL, DITROPAN, DILANTIN   DO NOT TAKE  ANY DIABETIC MEDICATIONS DAY OF YOUR SURGERY  You may not have any metal on your body including hair pins and              piercings  Do not wear jewelry, make-up, lotions, powders or perfumes, deodorant             Do not wear nail polish on your fingernails.  Do not shave  48 hours prior to surgery.              Men may shave face and neck.   Do not bring valuables to the hospital. [place].  Contacts, dentures or bridgework may not be worn into surgery.  Leave suitcase in the car. After surgery it may be brought to your room.     Patients discharged the day of surgery will not be allowed to drive home. IF YOU ARE HAVING SURGERY AND GOING HOME THE SAME DAY, YOU MUST HAVE AN ADULT TO DRIVE YOU HOME AND BE WITH YOU FOR 24 HOURS. YOU MAY GO HOME BY TAXI OR UBER OR ORTHERWISE, BUT AN ADULT MUST ACCOMPANY YOU HOME AND STAY WITH YOU FOR 24 HOURS.  Name and phone number of your driver:  Special Instructions: N/A              Please read over the following fact sheets you were given: _____________________________________________________________________  [facility] - Preparing for Surgery Before surgery, you can play an important role.  Because skin is not sterile, your skin needs to be as free of germs as possible.  You can reduce the number of germs on your skin by washing with CHG (chlorahexidine gluconate) soap before surgery.  CHG is an antiseptic cleaner which kills germs and bonds with the skin to continue killing germs even after washing. Please DO NOT use if you have an allergy to CHG or antibacterial soaps.  If your skin becomes reddened/irritated stop using the CHG and inform your nurse when you arrive at Short Stay. Do not shave (including legs and underarms) for at least 48 hours prior to the first CHG shower.  You may shave your face/neck. Please follow these instructions carefully:  1.  Shower with CHG  Soap the night before surgery and the  morning of Surgery.  2.  If you choose to wash your hair, wash your hair first as usual with your  normal  shampoo.  3.  After you shampoo, rinse your hair and body thoroughly to remove the  shampoo.                           4.  Use CHG as you would any other liquid soap.  You can apply chg directly  to the skin and wash                       Gently with a scrungie or clean washcloth.  5.  Apply the CHG Soap to your body ONLY FROM THE NECK DOWN.   Do not use on face/ open                           Wound or open sores. Avoid contact with eyes, ears mouth and genitals (private parts).  Wash face,  Genitals (private parts) with your normal soap.             6.  Wash thoroughly, paying special attention to the area where your surgery  will be performed.  7.  Thoroughly rinse your body with warm water from the neck down.  8.  DO NOT shower/wash with your normal soap after using and rinsing off  the CHG Soap.                9.  Pat yourself dry with a clean towel.            10.  Wear clean pajamas.            11.  Place clean sheets on your bed the night of your first shower and do not  sleep with pets. Day of Surgery : Do not apply any lotions/deodorants the morning of surgery.  Please wear clean clothes to the hospital/surgery center.  FAILURE TO FOLLOW THESE INSTRUCTIONS MAY RESULT IN THE CANCELLATION OF YOUR SURGERY PATIENT SIGNATURE_________________________________  NURSE SIGNATURE__________________________________  ________________________________________________________________________

## 2021-03-07 ENCOUNTER — Encounter: Payer: Self-pay | Admitting: Urology

## 2021-03-07 ENCOUNTER — Other Ambulatory Visit: Payer: Self-pay

## 2021-03-07 ENCOUNTER — Ambulatory Visit (INDEPENDENT_AMBULATORY_CARE_PROVIDER_SITE_OTHER): Payer: Medicare Other | Admitting: Urology

## 2021-03-07 VITALS — BP 165/76 | HR 66 | Temp 97.6°F | Ht 69.0 in | Wt 230.0 lb

## 2021-03-07 DIAGNOSIS — N5231 Erectile dysfunction following radical prostatectomy: Secondary | ICD-10-CM | POA: Diagnosis not present

## 2021-03-07 DIAGNOSIS — C61 Malignant neoplasm of prostate: Secondary | ICD-10-CM | POA: Diagnosis not present

## 2021-03-07 DIAGNOSIS — N3281 Overactive bladder: Secondary | ICD-10-CM

## 2021-03-07 LAB — URINALYSIS, ROUTINE W REFLEX MICROSCOPIC
Bilirubin, UA: NEGATIVE
Glucose, UA: NEGATIVE
Ketones, UA: NEGATIVE
Leukocytes,UA: NEGATIVE
Nitrite, UA: NEGATIVE
Protein,UA: NEGATIVE
RBC, UA: NEGATIVE
Specific Gravity, UA: 1.025 (ref 1.005–1.030)
Urobilinogen, Ur: 0.2 mg/dL (ref 0.2–1.0)
pH, UA: 6 (ref 5.0–7.5)

## 2021-03-07 LAB — BLADDER SCAN AMB NON-IMAGING: Scan Result: 1

## 2021-03-07 NOTE — Progress Notes (Signed)
Bladder Scan Patient can void: 1 ml Performed By: Durenda Guthrie, lpn   Urological Symptom Review  Patient is experiencing the following symptoms: Frequent urination Hard to postpone urination Get up at night to urinate Leakage of urine Erection problems (male only)   Review of Systems  Gastrointestinal (upper)  : Negative for upper GI symptoms  Gastrointestinal (lower) : Negative for lower GI symptoms  Constitutional : Negative for symptoms  Skin: Negative for skin symptoms  Eyes: Negative for eye symptoms  Ear/Nose/Throat : Negative for Ear/Nose/Throat symptoms  Hematologic/Lymphatic: Easy bruising  Cardiovascular : Negative for cardiovascular symptoms  Respiratory : Shortness of breath  Endocrine: Excessive thirst  Musculoskeletal: Negative for musculoskeletal symptoms  Neurological: Negative for neurological symptoms  Psychologic: Negative for psychiatric symptoms

## 2021-03-07 NOTE — Progress Notes (Signed)
03/07/2021 2:10 PM   [name] 1951-07-11 [id number]  Referring provider: [name], MD [address] [place],  [place] [address]  followup prostate cancer  HPI: [name] is a 70yo here for followup for prostate cancer, urinary incontinence, and erectile dysfunction. PSA remains undetectable. He continues to have significant SUI and UUI. He is scheduled to have an artificial urinary sphincter placed on 3/22 with Dr. Matilde Sprang. He continue to have ED and is not interested in therapy.   PMH: Past Medical History:  Diagnosis Date  . Anxiety    hx of   . Arthritis    RA  . Asthma   . CHF (congestive heart failure) ([place])   . Clotting disorder (HCC)    DVT both legs   . COPD (chronic obstructive pulmonary disease) ([place])   . DDD (degenerative disc disease), cervical    with UE's paresthesias  . Diverticulosis   . DVT, lower extremity ([place])    bilat  . 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 GERD (gastroesophageal reflux disease)   . Gout   . Heart murmur   . History of measles   . History of shingles   . Hypercholesterolemia   . Hypertension   . IBS (irritable bowel syndrome)   . IBS (irritable bowel syndrome)   . Peripheral edema   . Pneumonia    hx of   . Prostate cancer ([place])   . Seizures ([place])    last seizure 20 years ago; on dilantin. Unknown origin.  [name] Shortness of breath dyspnea    exertion   . Sleep apnea    not on cpap  . Tubular adenoma of colon 04/2008    Surgical History: Past Surgical History:  Procedure Laterality Date  . CHOLECYSTECTOMY    . COLONOSCOPY    . CYSTOSCOPY WITH LITHOLAPAXY N/A 03/17/2019   Procedure: CYSTOSCOPY WITH LITHOLAPAXY AND REMOVAL OF FOREIGN BODY;  Surgeon: [name], MD;  Location: [facility];  Service: Urology;  Laterality: N/A;  . DOPPLER ECHOCARDIOGRAPHY N/A 01-21-2012   TECHNICALLY DIFFICULT. MILD CONCENTRIC LV HYPERTROPHY. LV CAVITY IS  SMALL.. EF=> 55%. TRANSMITRAL SPECTRAL FLOW PATTREN IS SUGGESTIVE OF IMPAIRED LV RELAXATION. RV SYSTOLIC PRESSURE IS [id number]. LEFT ATRIAL SIZE IS NORMAL. AV APPEARS MILDLY SCLEROTIC. NO SIGN VALVE DISEASE NOTED.  [name] LYMPHADENECTOMY Bilateral 08/30/2015   Procedure: PELVIC LYMPHADENECTOMY;  Surgeon: [name], MD;  Location: [facility];  Service: Urology;  Laterality: Bilateral;  . NUCLEAR STRESS TEST N/A 01-21-2012   NORMAL PATTERN OF PERFUSION IN ALL REGIONS. POST STRESS LV SIZE IS NORMAL. NO EVIDENCE OF INDUCIBLE ISCHEMIA. EF 54%.  [name] POLYPECTOMY    . PROSTATE BIOPSY    . ROBOT ASSISTED LAPAROSCOPIC RADICAL PROSTATECTOMY N/A 08/30/2015   Procedure: ROBOTIC ASSISTED LAPAROSCOPIC RADICAL PROSTATECTOMY;  Surgeon: [name], MD;  Location: [facility];  Service: Urology;  Laterality: N/A;  . US VENOUS LOWER EXT Right 03/07/11   PERSISTENT DVT IN RIGHT LOWER EXT.WITH PERSISTANT VISUALIZATION OF HYPOECHOIC THROMBUS WITHIN THE FEMORAL, PROFUNDA FEMORAL AND POPLITEAL VEINS. WHEN COMPARED TO PREVIOUS, CLOT IS NO LONGER IDENTIFIED WITH IN THE RIGHT CFV.    Home Medications:  Allergies as of 03/07/2021      Reactions   Orencia [abatacept] Anaphylaxis   Had prostate cancer   Carbamazepine Rash   REACTION: makes drowsy BRAND NAME IS TEGRETOL   Celecoxib Itching, Rash   BRAND NAME IS  CELEBREX   Cephalexin Rash   "Severe Rash".  BRAND NAME IS KEFLEX.    Enbrel [etanercept] Rash   Humira [adalimumab] Rash   Levofloxacin Other (See Comments)   REACTION: GI Intolerance BRAND NAME IS LEVAQUIN   Sulfa Antibiotics Rash   Sulfasalazine Rash      Medication List       Accurate as of March 07, 2021  2:10 PM. If you have any questions, ask your nurse or doctor.        acetaminophen 650 MG CR tablet Commonly known as: TYLENOL Take 1,300 mg by mouth every 8 (eight) hours as needed for pain.   ACTEMRA IV Inject 4 mg/kg into the vein every 28 (twenty-eight) days. For Rheumatoid Arthritis. Last  ordered 06/22/20 x 2 doses   albuterol (2.5 MG/3ML) 0.083% nebulizer solution Commonly known as: PROVENTIL Take 3 mLs (2.5 mg total) by nebulization every 6 (six) hours as needed for wheezing or shortness of breath.   alendronate 70 MG tablet Commonly known as: FOSAMAX TAKE WITH A FULL GLASS OF WATER ON AN EMPTY STOMACH What changed: See the new instructions.   allopurinol 100 MG tablet Commonly known as: ZYLOPRIM TAKE 1 TABLET BY MOUTH TWICE A DAY   Breo Ellipta 100-25 MCG/INH Aepb Generic drug: fluticasone furoate-vilanterol USE 1 INHALATION ORALLY    DAILY What changed: See the new instructions.   cholecalciferol 25 MCG (1000 UNIT) tablet Commonly known as: VITAMIN D3 Take 1,000 Units by mouth daily.   CITRACAL +D3 PO Take 2 tablets by mouth 2 (two) times daily.   diclofenac Sodium 1 % Gel Commonly known as: VOLTAREN Apply 2-4 grams to affected joint 4 times daily as needed. What changed:   how much to take  how to take this  when to take this  reasons to take this   dicyclomine 10 MG capsule Commonly known as: BENTYL TAKE 1 CAPSULE BY MOUTH 4 TIMES DAILY BEFORE MEALS AND AT BEDTIME What changed: See the new instructions.   folic acid 1 MG tablet Commonly known as: FOLVITE TAKE 2 TABLETS BY MOUTH EVERY MORNING What changed: when to take this   furosemide 40 MG tablet Commonly known as: LASIX TAKE 1 TABLET BY MOUTH TWICE A DAY What changed: when to take this   Incruse Ellipta 62.5 MCG/INH Aepb Generic drug: umeclidinium bromide INHALE 1 PUFF BY MOUTH EVERY DAY What changed: See the new instructions.   leflunomide 20 MG tablet Commonly known as: ARAVA TAKE 1 TABLET BY MOUTH EVERY DAY What changed: when to take this   omeprazole 40 MG capsule Commonly known as: PRILOSEC Take 40 mg by mouth at bedtime.   oxybutynin 10 MG 24 hr tablet Commonly known as: DITROPAN-XL Take 10 mg by mouth daily.   phenytoin 100 MG ER capsule Commonly known as:  DILANTIN Take 100-200 mg by mouth See admin instructions. Take one capsule in the morning and 2 capsules at bedtime   Potassium Chloride ER 20 MEQ Tbcr Take 20 mEq by mouth at bedtime.   PROBIOTIC DAILY PO Take 1 capsule by mouth every evening.   rosuvastatin 10 MG tablet Commonly known as: CRESTOR Take 10 mg by mouth daily.   warfarin 3 MG tablet Commonly known as: COUMADIN Take 3 mg by mouth at bedtime.       Allergies:  Allergies  Allergen Reactions  . Orencia [Abatacept] Anaphylaxis    Had prostate cancer  . Carbamazepine Rash    REACTION: makes drowsy BRAND NAME IS  TEGRETOL  . Celecoxib Itching and Rash    BRAND NAME IS CELEBREX  . Cephalexin Rash    "Severe Rash".  BRAND NAME IS KEFLEX.   . Enbrel [Etanercept] Rash  . Humira [Adalimumab] Rash  . Levofloxacin Other (See Comments)    REACTION: GI Intolerance BRAND NAME IS LEVAQUIN  . Sulfa Antibiotics Rash  . Sulfasalazine Rash    Family History: Family History  Problem Relation Age of Onset  . Skin cancer Father   . Stomach cancer Father   . Heart attack Father   . Alzheimer's disease Mother   . Throat cancer Brother   . Stomach cancer Brother   . Lung cancer Brother   . Heart attack Brother   . Heart attack Brother   . Breast cancer Sister   . Heart attack Sister   . Stroke Sister   . Bladder Cancer Sister   . Heart murmur Sister   . Colon cancer Brother   . Colon polyps Neg Hx     Social History:  reports that he quit smoking about 24 years ago. His smoking use included cigarettes. He has a 60.00 pack-year smoking history. He has never used smokeless tobacco. He reports that he does not drink alcohol and does not use drugs.  ROS: All other review of systems were reviewed and are negative except what is noted above in HPI  Physical Exam: BP (!) 165/76   Pulse 66   Temp 97.6 F (36.4 C)   Ht 5\' 9"  (1.753 m)   Wt 230 lb (104.3 kg)   BMI 33.97 kg/m   Constitutional:  Alert and oriented,  No acute distress. HEENT: [place] AT, moist mucus membranes.  Trachea midline, no masses. Cardiovascular: No clubbing, cyanosis, or edema. Respiratory: Normal respiratory effort, no increased work of breathing. GI: Abdomen is soft, nontender, nondistended, no abdominal masses GU: No CVA tenderness.  Lymph: No cervical or inguinal lymphadenopathy. Skin: No rashes, bruises or suspicious lesions. Neurologic: Grossly intact, no focal deficits, moving all 4 extremities. Psychiatric: Normal mood and affect.  Laboratory Data: Lab Results  Component Value Date   WBC 4.5 03/01/2021   HGB 14.4 03/01/2021   HCT 43.7 03/01/2021   MCV 90.9 03/01/2021   PLT 150 03/01/2021    Lab Results  Component Value Date   CREATININE 1.32 (H) 03/01/2021    Lab Results  Component Value Date   PSA <0.1 02/16/2020    No results found for: TESTOSTERONE  No results found for: HGBA1C  Urinalysis    Component Value Date/Time   COLORURINE YELLOW 11/29/2018 1033   APPEARANCEUR HAZY (A) 11/29/2018 1033   LABSPEC 1.025 11/29/2018 1033   PHURINE 5.0 11/29/2018 1033   GLUCOSEU NEGATIVE 11/29/2018 1033   HGBUR MODERATE (A) 11/29/2018 1033   BILIRUBINUR negative 09/08/2020 1326   KETONESUR NEGATIVE 11/29/2018 1033   PROTEINUR Positive (A) 09/08/2020 1326   PROTEINUR 30 (A) 11/29/2018 1033   UROBILINOGEN 0.2 09/08/2020 1326   UROBILINOGEN 0.2 09/24/2012 1640   NITRITE negative 09/08/2020 1326   NITRITE NEGATIVE 11/29/2018 1033   LEUKOCYTESUR Negative 09/08/2020 1326    Lab Results  Component Value Date   BACTERIA NONE SEEN 11/29/2018    Pertinent Imaging:  Results for orders placed during the hospital encounter of 05/27/13  DG Abd 1 View  Narrative *RADIOLOGY REPORT*  Clinical Data: The mid to lower abdominal pain for 1 month with some nausea and constipation  ABDOMEN - 1 VIEW  Comparison: 03/15/2011, abdomen CT  Findings: The bowel gas pattern is within normal limits.  There is no  significant increased stool.  There are changes from a prior cholecystectomy.  The soft tissues are otherwise unremarkable.  No significant bony abnormality.  IMPRESSION: No acute findings.  No obstruction.  No significant increase in stool.   Original Report Authenticated By: Lajean Manes, M.D.  Results for orders placed during the hospital encounter of 09/03/07  US VenoUS Imaging Bilateral  Narrative Clinical Data: Bilateral lower extremity edema.  History of DVT.  Increased d-dimer. BILATERAL LOWER EXTREMITY VENOUS DOPPLER ULTRASOUND: Technique:  Gray-scale sonography with compression, as well as color and duplex Doppler ultrasound, were performed to evaluate the deep venous system from the level of the common femoral vein through the popliteal and proximal calf veins. Findings:  There is no evidence of acute DVT.  Normal compressibility phasicity and augmentation of the deep veins bilaterally.  There is some residual thrombus in the left popliteal and left posterior tibial veins.  Patient had findings of an acute DVT in the left popliteal and posterior tibial veins as noted on the 04/29/05 exam.  Impression Negative for acute DVT involving the lower extremities.  Provider: Mertie Clause  No results found for this or any previous visit.  No results found for this or any previous visit.  No results found for this or any previous visit.  No results found for this or any previous visit.  No results found for this or any previous visit.  No results found for this or any previous visit.   Assessment & Plan:    1. OAB (overactive bladder) -continue oxybutynin - Urinalysis, Routine w reflex microscopic - Bladder Scan (Post Void Residual) in office  2. Prostate cancer ([place]) -RTC 1 year with PSA  3. Erectile dysfunction after radical prostatectomy -patient defers therapy at this time   No follow-ups on file.  Nicolette Bang, MD  [facility]  [place]

## 2021-03-07 NOTE — Patient Instructions (Signed)
Prostate Cancer  The prostate is a male gland that helps make semen. It is located below a man's bladder, in front of the rectum. Prostate cancer is when abnormal cells grow in this gland. What are the causes? The cause of this condition is not known. What increases the risk? You are more likely to develop this condition if:  You are 71 years of age or older.  You are African American.  You have a family history of prostate cancer.  You have a family history of breast cancer. What are the signs or symptoms? Symptoms of this condition include:  A need to pee often.  Peeing that is weak, or pee that stops and starts.  Trouble starting or stopping your pee.  Inability to pee.  Blood in your pee or semen.  Pain in the lower back, lower belly (abdomen), hips, or upper thighs.  Trouble getting an erection.  Trouble emptying all of your pee. How is this treated? Treatment for this condition depends on your age, your health, the kind of treatment you like, and how far the cancer has spread. Treatments include:  Being watched. This is called observation. You will be tested from time to time, but you will not get treated. Tests are to make sure that the cancer is not growing.  Surgery. This may be done to remove the prostate, to remove the testicles, or to freeze or kill cancer cells.  Radiation. This uses a strong beam to kill cancer cells.  Ultrasound energy. This uses strong sound waves to kill cancer cells.  Chemotherapy. This uses medicines that stop cancer cells from increasing. This kills cancer cells and healthy cells.  Targeted therapy. This kills cancer cells only. Healthy cells are not affected.  Hormone treatment. This stops the body from making hormones that help the cancer cells to grow. Follow these instructions at home:  Take over-the-counter and prescription medicines only as told by your doctor.  Eat a healthy diet.  Get plenty of sleep.  Ask your  doctor for help to find a support group for men with prostate cancer.  If you have to go to the hospital, let your cancer doctor (oncologist) know.  Treatment may affect your ability to have sex. Touch, hold, hug, and caress your partner to have intimate moments.  Keep all follow-up visits as told by your doctor. This is important. Contact a doctor if:  You have new or more trouble peeing.  You have new or more blood in your pee.  You have new or more pain in your hips, back, or chest. Get help right away if:  You have weakness in your legs.  You lose feeling in your legs.  You cannot control your pee or your poop (stool).  You have chills or a fever. Summary  The prostate is a male gland that helps make semen.  Prostate cancer is when abnormal cells grow in this gland.  Treatment includes doing surgery, using medicines, using very strong beams, or watching without treatment.  Ask your doctor for help to find a support group for men with prostate cancer.  Contact a doctor if you have problems peeing or have any new pain that you did not have before. This information is not intended to replace advice given to you by your health care provider. Make sure you discuss any questions you have with your health care provider. Document Revised: 11/30/2019 Document Reviewed: 11/30/2019 Elsevier Patient Education  2021 [employer]vier Inc.  

## 2021-03-08 ENCOUNTER — Encounter (HOSPITAL_COMMUNITY)
Admission: RE | Admit: 2021-03-08 | Discharge: 2021-03-08 | Disposition: A | Discharge status: Home / Self Care | Payer: Medicare Other | Source: Ambulatory Visit | Attending: Urology | Admitting: Urology

## 2021-03-08 ENCOUNTER — Encounter (HOSPITAL_COMMUNITY): Payer: Self-pay

## 2021-03-08 ENCOUNTER — Other Ambulatory Visit: Payer: Self-pay

## 2021-03-08 DIAGNOSIS — Z01812 Encounter for preprocedural laboratory examination: Secondary | ICD-10-CM | POA: Insufficient documentation

## 2021-03-08 LAB — CBC
HCT: 46 % (ref 39.0–52.0)
Hemoglobin: 15.1 g/dL (ref 13.0–17.0)
MCH: 30 pg (ref 26.0–34.0)
MCHC: 32.8 g/dL (ref 30.0–36.0)
MCV: 91.3 fL (ref 80.0–100.0)
Platelets: 166 10*3/uL (ref 150–400)
RBC: 5.04 MIL/uL (ref 4.22–5.81)
RDW: 13.7 % (ref 11.5–15.5)
WBC: 4.6 10*3/uL (ref 4.0–10.5)
nRBC: 0 % (ref 0.0–0.2)

## 2021-03-08 LAB — BASIC METABOLIC PANEL
Anion gap: 6 (ref 5–15)
BUN: 17 mg/dL (ref 8–23)
CO2: 32 mmol/L (ref 22–32)
Calcium: 9.2 mg/dL (ref 8.9–10.3)
Chloride: 104 mmol/L (ref 98–111)
Creatinine, Ser: 1.19 mg/dL (ref 0.61–1.24)
GFR, Estimated: >60 mL/min (ref >60)
Glucose, Bld: 109 mg/dL — ABNORMAL HIGH (ref 70–99)
Potassium: 4.3 mmol/L (ref 3.5–5.1)
Sodium: 142 mmol/L (ref 135–145)

## 2021-03-08 NOTE — Progress Notes (Addendum)
Anesthesia Review:  PCP: [place] 12/14/2020 on chart with clearance in note  Cardiologist : DR Croituri Clearance 11/30/20  LOV 09/07/20 clearance on chart  Pulmonary- Dr Chase Caller  LOV 07/05/2019  Hx of seizures- last seizure 15-20 yrs ago per pt  Chest x-ray : EKG :09/07/20 Echo : Stress test:- 2020  ECHO: 2020  Cardiac Cath :  Activity level: can do a flight of stairs without difficulty  Sleep Study/ CPAP : has cpap  Fasting Blood Sugar :      / Checks Blood Sugar -- times a day:   Blood Thinner/ Instructions /Last Dose: ASA / Instructions/ Last Dose :  Coumadin for hx of DVT- PT states he is to stop 5 days prior to surgery  Order for PT/INR for day of surgery .

## 2021-03-13 DIAGNOSIS — N3946 Mixed incontinence: Secondary | ICD-10-CM | POA: Diagnosis not present

## 2021-03-13 NOTE — Progress Notes (Signed)
Anesthesia Chart Review   Case: [id number] Date/Time: 03/20/21 0715   Procedure: ARTIFICIAL URINARY SPHINCTER CYSTOSCOPY (N/A ) - REQUESTING 46 MINS   Anesthesia type: General   Pre-op diagnosis: STRESS, INCONTINENCE   Location: [facility] ROOM 05 / [facility]   Surgeons: Bjorn Loser, MD      DISCUSSION:69 y.o. former smoker with h/o GERD, COPD, sleep apnea w/cpap, CHF, DVT (on Coumadin), stress incontinence scheduled for above procedure 03/20/2021 with Dr. Bjorn Loser.   Per cardiology preoperative evaluation 11/30/2020, "Chart reviewed as part of pre-operative protocol coverage. Patient was contacted 11/30/2020 in reference to pre-operative risk assessment for pending surgery as outlined below.  [name] was last seen on 09/07/20 by Dr. Sallyanne Kuster.  Since that day, [name] has done well. He can complete 4.0 METS without angina. He had a nonischemic stress test March 2020.  Therefore, based on ACC/AHA guidelines, the patient would be at acceptable risk for the planned procedure without further cardiovascular testing.  The patient was advised that if he develops new symptoms prior to surgery to contact our office to arrange for a follow-up visit, and he verbalized understanding. His coumadin is managed by Dr. Hilma Favors. Please reach out to his office for lovenox bridging questions."  Pt cleared by PCP, advised to hold Coumadin for 5 days prior to surgery.   Anticipate pt can proceed with planned procedure barring acute status change.   VS: BP (!) 155/74   Pulse 66   Temp 36.7 C (Oral)   Resp 16   SpO2 97%   PROVIDERS: [name], MD is PCP   Croitoru, Dani Gobble, MD is Cardiologist  LABS: Labs reviewed: Acceptable for surgery. (all labs ordered are listed, but only abnormal results are displayed)  Labs Reviewed  BASIC METABOLIC PANEL - Abnormal; Notable for the following components:      Result Value   Glucose, Bld 109 (*)    All other components within normal limits  CBC      IMAGES:   EKG: 09/07/2020 Rate 70 bpm  NSR  CV: Echo 06/08/2019 IMPRESSIONS    1. Limited images.  2. The left ventricle has normal systolic function, with an ejection  fraction of 55-60%. The cavity size was normal. There is mildly increased  left ventricular wall thickness. Left ventricular diastolic parameters  were normal.  3. The right ventricle has normal systolic function. The cavity was  normal. There is no increase in right ventricular wall thickness. Right  ventricular systolic pressure could not be assessed.  4. The aortic valve is tricuspid. Mild calcification of the aortic valve.  Mild to moderate aortic annular calcification noted.  5. The mitral valve is grossly normal. There is mild mitral annular  calcification present.  6. The aortic root is normal in size and structure.  7. The inferior vena cava was dilated in size with <50% respiratory  variability.   Stress Test 03/05/2019  Nuclear stress EF: 48%.  The left ventricular ejection fraction is mildly decreased (45-54%).  No T wave inversion was noted during stress.  There was no ST segment deviation noted during stress.  Defect 1: There is a small defect of mild severity.  This is a low risk study.   No significant reversible ischemia. LVEF 48% with normal wall motion. Inferior attenuation artifact is noted and tracing selected the bowel, lowering the LVEF, however, I suspect it is normal.  Overall, a low risk study.  Past Medical History:  Diagnosis Date  . Arthritis  RA  . Asthma   . CHF (congestive heart failure) ([place])    pt denies   . Clotting disorder (HCC)    DVT both legs   . COPD (chronic obstructive pulmonary disease) ([place])   . DDD (degenerative disc disease), cervical    with UE's paresthesias  . Diverticulosis   . DVT, lower extremity ([place])    bilat  . 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 GERD  (gastroesophageal reflux disease)   . Gout   . History of measles   . History of shingles   . Hypercholesterolemia   . IBS (irritable bowel syndrome)   . IBS (irritable bowel syndrome)   . Peripheral edema   . Pneumonia    hx of   . Prostate cancer ([place])   . Seizures ([place])    last seizure 20 years ago; on dilantin. Unknown origin.  [name] Shortness of breath dyspnea    exertion   . Sleep apnea    cpap   . Tubular adenoma of colon 04/2008    Past Surgical History:  Procedure Laterality Date  . CHOLECYSTECTOMY    . CIRCUMCISION    . COLONOSCOPY    . CYSTOSCOPY WITH LITHOLAPAXY N/A 03/17/2019   Procedure: CYSTOSCOPY WITH LITHOLAPAXY AND REMOVAL OF FOREIGN BODY;  Surgeon: [name], MD;  Location: [facility];  Service: Urology;  Laterality: N/A;  . DOPPLER ECHOCARDIOGRAPHY N/A 01-21-2012   TECHNICALLY DIFFICULT. MILD CONCENTRIC LV HYPERTROPHY. LV CAVITY IS SMALL.. EF=> 55%. TRANSMITRAL SPECTRAL FLOW PATTREN IS SUGGESTIVE OF IMPAIRED LV RELAXATION. RV SYSTOLIC PRESSURE IS 21JHER. LEFT ATRIAL SIZE IS NORMAL. AV APPEARS MILDLY SCLEROTIC. NO SIGN VALVE DISEASE NOTED.  [name] LYMPHADENECTOMY Bilateral 08/30/2015   Procedure: PELVIC LYMPHADENECTOMY;  Surgeon: [name], MD;  Location[facility];  Service: Urology;  Laterality: Bilateral;  . NUCLEAR STRESS TEST N/A 01-21-2012   NORMAL PATTERN OF PERFUSION IN ALL REGIONS. POST STRESS LV SIZE IS NORMAL. NO EVIDENCE OF INDUCIBLE ISCHEMIA. EF 54%.  [name] POLYPECTOMY    . PROSTATE BIOPSY    . ROBOT ASSISTED LAPAROSCOPIC RADICAL PROSTATECTOMY N/A 08/30/2015   Procedure: ROBOTIC ASSISTED LAPAROSCOPIC RADICAL PROSTATECTOMY;  Surgeon: [name], MD;  [facility];  Service: Urology;  Laterality: N/A;  . US VENOUS LOWER EXT Right 03/07/11   PERSISTENT DVT IN RIGHT LOWER EXT.WITH PERSISTANT VISUALIZATION OF HYPOECHOIC THROMBUS WITHIN THE FEMORAL, PROFUNDA FEMORAL AND POPLITEAL VEINS. WHEN COMPARED TO PREVIOUS, CLOT IS NO LONGER IDENTIFIED WITH IN  THE RIGHT CFV.    MEDICATIONS: . acetaminophen (TYLENOL) 650 MG CR tablet  . albuterol (PROVENTIL) (2.5 MG/3ML) 0.083% nebulizer solution  . alendronate (FOSAMAX) 70 MG tablet  . allopurinol (ZYLOPRIM) 100 MG tablet  . BREO ELLIPTA 100-25 MCG/INH AEPB  . Calcium-Phosphorus-Vitamin D (CITRACAL +D3 PO)  . cholecalciferol (VITAMIN D3) 25 MCG (1000 UNIT) tablet  . diclofenac Sodium (VOLTAREN) 1 % GEL  . dicyclomine (BENTYL) 10 MG capsule  . folic acid (FOLVITE) 1 MG tablet  . furosemide (LASIX) 40 MG tablet  . INCRUSE ELLIPTA 62.5 MCG/INH AEPB  . leflunomide (ARAVA) 20 MG tablet  . omeprazole (PRILOSEC) 40 MG capsule  . oxybutynin (DITROPAN-XL) 10 MG 24 hr tablet  . phenytoin (DILANTIN) 100 MG ER capsule  . Potassium Chloride ER 20 MEQ TBCR  . Probiotic Product (PROBIOTIC DAILY PO)  . rosuvastatin (CRESTOR) 10 MG tablet  . Tocilizumab (ACTEMRA IV)  . warfarin (COUMADIN)  3 MG tablet   No current facility-administered medications for this encounter.    Konrad Felix, PA-C [facility] [phone] @TODAY @ 2:59 PM

## 2021-03-13 NOTE — Anesthesia Preprocedure Evaluation (Addendum)
Anesthesia Evaluation  Patient identified by MRN, date of birth, ID band Patient awake    Reviewed: Allergy & Precautions, NPO status , Patient's Chart, lab work & pertinent test results  Airway Mallampati: II  TM Distance: >3 FB Neck ROM: Full    Dental  (+) Edentulous Upper, Missing   Pulmonary asthma , sleep apnea and Continuous Positive Airway Pressure Ventilation , COPD,  COPD inhaler, former smoker,    Pulmonary exam normal breath sounds clear to auscultation       Cardiovascular +CHF and + DVT  Normal cardiovascular exam Rhythm:Regular Rate:Normal  ECG: NSR, rate 70  ECHO: 1. Limited images.  2. The left ventricle has normal systolic function, with an ejection  fraction of 55-60%. The cavity size was normal. There is mildly increased  left ventricular wall thickness. Left ventricular diastolic parameters  were normal.  3. The right ventricle has normal systolic function. The cavity was  normal. There is no increase in right ventricular wall thickness. Right  ventricular systolic pressure could not be assessed.  4. The aortic valve is tricuspid. Mild calcification of the aortic valve.  Mild to moderate aortic annular calcification noted.  5. The mitral valve is grossly normal. There is mild mitral annular  calcification present.  6. The aortic root is normal in size and structure.  7. The inferior vena cava was dilated in size with <50% respiratory  variability.    Neuro/Psych Seizures -, Well Controlled,  negative psych ROS   GI/Hepatic Neg liver ROS, GERD  Medicated and Controlled,IBS (irritable bowel syndrome)   Endo/Other  negative endocrine ROS  Renal/GU negative Renal ROS     Musculoskeletal  (+) Arthritis , Gout   Abdominal (+) + obese,   Peds  Hematology HLD   Anesthesia Other Findings STRESS, INCONTINENCE  Reproductive/Obstetrics                            Anesthesia Physical Anesthesia Plan  ASA: III  Anesthesia Plan: General   Post-op Pain Management:    Induction: Intravenous  PONV Risk Score and Plan: 2 and Ondansetron, Dexamethasone and Treatment may vary due to age or medical condition  Airway Management Planned: LMA  Additional Equipment:   Intra-op Plan:   Post-operative Plan: Extubation in OR  Informed Consent: I have reviewed the patients History and Physical, chart, labs and discussed the procedure including the risks, benefits and alternatives for the proposed anesthesia with the patient or authorized representative who has indicated his/her understanding and acceptance.     Dental advisory given  Plan Discussed with: CRNA  Anesthesia Plan Comments: (Reviewed PAT note 03/08/2021, Konrad Felix, PA-C)      Anesthesia Quick Evaluation

## 2021-03-15 NOTE — H&P (Signed)
History of Present Illness:<HTML><META HTTP-EQUIV="content-type" CONTENT="text/html;charset=utf-8"><B>I was consulted to assess the patient's urinary incontinence. A robotic prostatectomy 3 years ago. He has had salvage radiation. He can't recall if he was leaking after surgery orifice worsened in the last year. He leaks with coughing sneezing bending lifting. He is not certain he leaks more when he is active. He raises is leg he leaks. He has urge incontinence. He has high-volume bedwetting. He wears 4-6 pads a day sometimes damp sometimes soak <BR><BR>He voids every 4 hours gets up 3 times a night to void. His flow is good <BR><BR>It appears that he had a urethral stone with stable at 9:00 a.m. likely at the bladder neck removed by Dr. Alyson Ingles. By history she failed Myrbetriq and VESIcare. <BR><BR>He is right handed. No hernia repair. He takes Coumadin but not aspirin. He has some blood per rectum from hemorrhoids <BR><BR>In the standing position he leaks a drop with a hard cough. It was delayed. Protuberant abdomen. <BR><BR>He did not void prior to the study was catheterized for 10 mL. Maximum bladder capacity was 220 mL. Bladder was unstable reaching pressure 31 cm water. It spiked to 54 cm of water. He had void involuntarily off the contraction. His Valsalva leak my pressure 200 mL was 88 cm of water with moderate to severe leakage. His cough leak point pressure was 81 cm water with mild leakage. At full capacity is Valsalva leak point pressure of 22 cm water. During voluntary voiding he voided 167 mL with maximum flow let mL per 2nd. Maximum voiding pressure 26 cm water. Empty efficiently. He had a high pressure void but it was felt it was a false positive. The voluntary void is noted and dictated. EMG activity was quiet during voiding. Bladder mildly trabeculated. Bladder was hypersensitive. He had a lean forward in the urodynamics chair to void. <BR><BR>I am concerned about the patient's overactive  bladder. I think this high-volume bedwetting and high volume leakage is due to overactivity. He would need to know that this could persist or worsen after an artificial sphincter. I will try him on oxybutynin. <BR><BR><BR>Incontinence 40% bette on new B3r. He can hold urges better but he still leaks at night. Clinically not infected. Frequency stable <BR>On cystoscopy the penile bulbar urethra normal. At the level of the bladder neck there was about 1 cm of rigidity and narrowing but the scope easily passed. He was erythematous and almost hemorrhagic submucosal. There was little stone at 12 and it to probably about a mm or 2 in size or smaller they did not brushed off with the scope. He had diffuse changes in his bladder with prominent submucosal vessels in keeping with radiation cystitis. There is no other mucosal abnormalities the stone it to brush off <BR><BR>Picture was drawn and went over an artificial sphincter in detail. At the time of the procedure I would also use a laser but probably just pushed small stone off. I did not see any suture or surgical clips. The bladder neck in my opinion is stable in size and does not need to be dilated or treated. He understands increased risk of infection erosion status post radiation. He does have a large abdomen but I think an artificial sphincter would still be placed readily. He may have harder time seeing the pump because of his belly. <BR><BR>He understands that any of his symptoms attributable to an overactive bladder specially the bedwetting could certainly persist post sphincter especially if it is from the radiation. He understands get also form  of larger stone at the bladder neck or have radiation issues in the bladder that is sphincter would complicate further its management. <BR><BR>Adding the new beta 3 agonist to his partial responding oxybutynin was discussed <BR><BR>reassess in 5 weeks on oxybutynin ER 10 mg in combination with new beta 3 agonist. We will  give him some handout. He will think about it. I am not convinced that he wants to have surgery and he does have medical comorbidities on blood thinners percutaneous tibial nerve stimulation may also help him. He really does not leak a lot when he coughs sneezes but does some <BR><BR>Today <BR>Frequency stable <BR>He is on oxybutynin with the new beta3 agonist and overall improved. He said the beta 3 agonist ran out a few days ago and he started having flooding bedwetting and foot on the floor syndrome. He was still wearing 3 or 4 pads a day with varying amounts of leakage. Clinically not infected. <BR>He would like to proceed with the artificial sphincter recognizing is high risk for incontinence post sphincter due to bladder overactivity and radiation change the bladder. He is at increased risk of in erosion and infection. He takes medication for rheumatoid arthritis that needs to be stopped any time he gets a potential infection. He understands if he is immunocompromised his sphincter infection rates or even higher in this was discussed. He still like to proceed. He will need this blood thinner stopped. More samples given and stay on oxybutynin <BR><BR>He has a number of allergies. I called in ciprofloxacin 250 mg twice a day for 10 days and he will start 3 days prior</B>  Past Medical History:  Diagnosis Date  . Arthritis    RA  . Asthma   . CHF (congestive heart failure) ([place])    pt denies   . Clotting disorder (HCC)    DVT both legs   . COPD (chronic obstructive pulmonary disease) ([place])   . DDD (degenerative disc disease), cervical    with UE's paresthesias  . Diverticulosis   . DVT, lower extremity ([place])    bilat  . 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 GERD (gastroesophageal reflux disease)   . Gout   . History of measles   . History of shingles   . Hypercholesterolemia   . IBS (irritable bowel syndrome)   . IBS (irritable  bowel syndrome)   . Peripheral edema   . Pneumonia    hx of   . Prostate cancer ([place])   . Seizures ([place])    last seizure 20 years ago; on dilantin. Unknown origin.  [name] Shortness of breath dyspnea    exertion   . Sleep apnea    cpap   . Tubular adenoma of colon 04/2008   Past Surgical History:  Procedure Laterality Date  . CHOLECYSTECTOMY    . CIRCUMCISION    . COLONOSCOPY    . CYSTOSCOPY WITH LITHOLAPAXY N/A 03/17/2019   Procedure: CYSTOSCOPY WITH LITHOLAPAXY AND REMOVAL OF FOREIGN BODY;  Surgeon: [name], MD;  Location: [facility];  Service: Urology;  Laterality: N/A;  . DOPPLER ECHOCARDIOGRAPHY N/A 01-21-2012   TECHNICALLY DIFFICULT. MILD CONCENTRIC LV HYPERTROPHY. LV CAVITY IS SMALL.. EF=> 55%. TRANSMITRAL SPECTRAL FLOW PATTREN IS SUGGESTIVE OF IMPAIRED LV RELAXATION. RV SYSTOLIC PRESSURE IS 00FVCB. LEFT ATRIAL SIZE IS NORMAL. AV APPEARS MILDLY SCLEROTIC. NO SIGN VALVE DISEASE NOTED.  [name] LYMPHADENECTOMY Bilateral 08/30/2015   Procedure: PELVIC LYMPHADENECTOMY;  Surgeon:  [name], MD;  Location[facility];  Service: Urology;  Laterality: Bilateral;  . NUCLEAR STRESS TEST N/A 01-21-2012   NORMAL PATTERN OF PERFUSION IN ALL REGIONS. POST STRESS LV SIZE IS NORMAL. NO EVIDENCE OF INDUCIBLE ISCHEMIA. EF 54%.  [name] POLYPECTOMY    . PROSTATE BIOPSY    . ROBOT ASSISTED LAPAROSCOPIC RADICAL PROSTATECTOMY N/A 08/30/2015   Procedure: ROBOTIC ASSISTED LAPAROSCOPIC RADICAL PROSTATECTOMY;  Surgeon: [name], MD;  [facility];  Service: Urology;  Laterality: N/A;  . US VENOUS LOWER EXT Right 03/07/11   PERSISTENT DVT IN RIGHT LOWER EXT.WITH PERSISTANT VISUALIZATION OF HYPOECHOIC THROMBUS WITHIN THE FEMORAL, PROFUNDA FEMORAL AND POPLITEAL VEINS. WHEN COMPARED TO PREVIOUS, CLOT IS NO LONGER IDENTIFIED WITH IN THE RIGHT CFV.    Medications: I have reviewed the patient's current medications. Allergies:  Allergies  Allergen Reactions  . Orencia [Abatacept] Anaphylaxis    Had  prostate cancer  . Carbamazepine Rash    REACTION: makes drowsy BRAND NAME IS TEGRETOL  . Celecoxib Itching and Rash    BRAND NAME IS CELEBREX  . Cephalexin Rash    "Severe Rash".  BRAND NAME IS KEFLEX.   . Enbrel [Etanercept] Rash  . Humira [Adalimumab] Rash  . Levofloxacin Other (See Comments)    REACTION: GI Intolerance BRAND NAME IS LEVAQUIN  . Sulfa Antibiotics Rash  . Sulfasalazine Rash    Family History  Problem Relation Age of Onset  . Skin cancer Father   . Stomach cancer Father   . Heart attack Father   . Alzheimer's disease Mother   . Throat cancer Brother   . Stomach cancer Brother   . Lung cancer Brother   . Heart attack Brother   . Heart attack Brother   . Breast cancer Sister   . Heart attack Sister   . Stroke Sister   . Bladder Cancer Sister   . Heart murmur Sister   . Colon cancer Brother   . Colon polyps Neg Hx    Social History:  reports that he quit smoking about 24 years ago. His smoking use included cigarettes. He has a 60.00 pack-year smoking history. He has never used smokeless tobacco. He reports that he does not drink alcohol and does not use drugs.  ROS: All systems are reviewed and negative except as noted. Rest negative  Physical Exam:  Vital signs in last 24 hours:    Cardiovascular: Skin warm; not flushed Respiratory: Breaths quiet; no shortness of breath Abdomen: No masses Neurological: Normal sensation to touch Musculoskeletal: Normal motor function arms and legs Lymphatics: No inguinal adenopathy Skin: No rashes Genitourinary:as noted  Laboratory Data:  No results found for this or any previous visit (from the past 72 hour(s)). No results found for this or any previous visit (from the past 240 hour(s)). Creatinine: No results for input(s): CREATININE in the last 168 hours.  Xrays: See report/chart Noted   Impression/Assessment:  noted  Plan:  noted  Brittanie Dosanjh A Tamila Gaulin 03/15/2021, 3:17 PM

## 2021-03-15 NOTE — H&P (Signed)
I was consulted to assess the patient's urinary incontinence. A robotic prostatectomy 3 years ago. He has had salvage radiation. He can't recall if he was leaking after surgery orifice worsened in the last year. He leaks with coughing sneezing bending lifting. He is not certain he leaks more when he is active. He raises is leg he leaks. He has urge incontinence. He has high-volume bedwetting. He wears 4-6 pads a day sometimes damp sometimes soak   He voids every 4 hours gets up 3 times a night to void. His flow is good   It appears that he had a urethral stone with stable at 9:00 a.m. likely at the bladder neck removed by Dr. Alyson Ingles. By history she failed Myrbetriq and VESIcare.   He is right handed. No hernia repair. He takes Coumadin but not aspirin. He has some blood per rectum from hemorrhoids   In the standing position he leaks a drop with a hard cough. It was delayed. Protuberant abdomen.   Bladder scan 18 mL. Chart reviewed. Urine reviewed and sent for culture   Urodynamics ordered. He would need a repeat cystoscopy. Will proceed accordingly. Foreign body at the bladder neck and radiation changes need to be inspected for. he does have impressive bedwetting supporting an overactive bladder.     ALLERGIES: Amoxicillin TABS CeleBREX CAPS Codeine Derivatives Doxycycline Hyclate CAPS Humira KIT Keflex TABS Levaquin TABS orencia Sulfa Drugs TEGretol TABS    MEDICATIONS: Cipro 500 mg tablet 1 tablet PO BID  Prilosec  Albuterol Sulfate 4 mg tablet  Allopurinol 100 mg tablet Oral  Breo Ellipta 1 PO Daily  Calcium + D  Colcrys 0.6 mg tablet Oral  Coumadin 3 mg tablet Oral  Dicyclomine Hcl 10 mg capsule Oral  Dilantin 100 mg capsule Oral  Incruse Ellipta 1 PO Daily  Ondansetron Hcl 4 mg tablet 0 Oral  Proair Hfa 90 mcg hfa aerosol with adapter Inhalation     GU PSH: Complex Uroflow - 03/19/2019 Cystoscopy - 02/10/2019 Laparoscopy; Lymphadenectomy - 2016 Robotic Radical  Prostatectomy - 2016       PSH Notes: Laparoscopy With Bilateral Total Pelvic Lymphadenectomy, Prostatect Retropubic Radical W/ Nerve Sparing Laparoscopic, Cholecystectomy   NON-GU PSH: Cholecystectomy (open) - 2016 Neuroeltrd Stim Post Tibial - 06/16/2020, 05/26/2020, 05/12/2020, 05/05/2020, 04/28/2020, 04/21/2020, 04/14/2020, 04/07/2020, 03/30/2020, 03/24/2020, 03/17/2020, 03/10/2020     GU PMH: Prostate Cancer - 06/16/2019, - 12/16/2018, - 2019, - 2019, - 2018, - 2018, - 2018, - 2018, - 2017, Prostate cancer, - 2017 Urge incontinence - 06/16/2019, - 05/25/2019, - 04/19/2019 Gross hematuria - 02/10/2019, - 12/16/2018 Urinary Urgency - 12/16/2018, - 2019, - 2019, - 2018, - 2018 ED following radical prostatectomy - 2018, - 2018, - 2018, - 2018 Rising PSA after prostate cancer treatment - 2017 ED due to arterial insufficiency, Erectile dysfunction due to arterial insufficiency - 2017 Stress Incontinence, Male stress incontinence - 2017 Nocturia, Nocturia - 2017 Elevated PSA, Elevated prostate specific antigen (PSA) - 2016      PMH Notes:  2015-05-18 09:18:46 - Note: Seizure   NON-GU PMH: Muscle weakness (generalized), Muscle weakness - 2017 Cellulitis of abdominal wall, Cellulitis of right abdominal wall - 2016 Encounter for general adult medical examination without abnormal findings, Encounter for preventive health examination - 2016 Anxiety, Anxiety - 2016 Personal history of other diseases of the digestive system, History of irritable bowel syndrome - 2016, History of esophageal reflux, - 2016 Personal history of other diseases of the musculoskeletal system and connective tissue, History of gout -  2016, History of arthritis, - 2016 Personal history of other diseases of the nervous system and sense organs, History of sleep apnea - 2016 Personal history of other diseases of the respiratory system, History of asthma - 2016 Personal history of other endocrine, nutritional and metabolic disease, History  of hypercholesterolemia - 2016 Personal history of thrombophlebitis, History of deep vein thrombophlebitis of lower extremity - 2016    FAMILY HISTORY: Alzheimer's Disease - Runs In Family cardiac disorder - Runs In Family Death of family member - Runs In Family Diabetes - Runs In Family malignant neoplasm of stomach - Runs In Family skin cancer - Runs In Family   SOCIAL HISTORY: Marital Status: Married Ethnicity: Not Hispanic Or Latino; Race: White     Notes: Activities of daily living (ADL's), independent, Exercise habits, Married, Caffeine use, Alcohol use, Occupation, Number of children, Former smoker   REVIEW OF SYSTEMS:    GU Review Male:   Patient denies frequent urination, hard to postpone urination, burning/ pain with urination, get up at night to urinate, leakage of urine, stream starts and stops, trouble starting your stream, have to strain to urinate , erection problems, and penile pain.  Gastrointestinal (Upper):   Patient denies nausea, vomiting, and indigestion/ heartburn.  Gastrointestinal (Lower):   Patient denies diarrhea and constipation.  Constitutional:   Patient denies fever, night sweats, weight loss, and fatigue.  Skin:   Patient denies skin rash/ lesion and itching.  Eyes:   Patient denies blurred vision and double vision.  Ears/ Nose/ Throat:   Patient denies sore throat and sinus problems.  Hematologic/Lymphatic:   Patient denies swollen glands and easy bruising.  Cardiovascular:   Patient denies leg swelling and chest pains.  Respiratory:   Patient denies cough and shortness of breath.  Endocrine:   Patient denies excessive thirst.  Musculoskeletal:   Patient denies back pain and joint pain.  Neurological:   Patient denies headaches and dizziness.  Psychologic:   Patient denies depression and anxiety.   VITAL SIGNS:      06/29/2020 10:46 AM  Weight 220 lb / 99.79 kg  Height 69 in / 175.26 cm  BP 169/91 mmHg  Pulse 63 /min  Temperature 97.5 F / 36.3 C   BMI 32.5 kg/m   Complexity of Data:   06/10/19 12/09/18 06/10/18 03/04/18 11/27/17 08/27/17 05/21/17 03/26/17  PSA  Total PSA < 0.1 ng/dl < 0.1 ng/dl < 0.1 ng/dl < 0.1 ng/dl < 0.1 ng/dl < 0.1 ng/dl 0.1 ng/dl 0.1 ng/dl    PROCEDURES:         PVR Ultrasound - 51798  Scanned Volume: 18 cc         Urinalysis Dipstick Dipstick Cont'd  Color: Yellow Bilirubin: Neg mg/dL  Appearance: Clear Ketones: Neg mg/dL  Specific Gravity: 1.020 Blood: Neg ery/uL  pH: 6.0 Protein: Neg mg/dL  Glucose: Neg mg/dL Urobilinogen: 0.2 mg/dL    Nitrites: Neg    Leukocyte Esterase: Neg leu/uL    ASSESSMENT:      ICD-10 Details  1 GU:   Mixed incontinence - N39.46   2   Urinary Frequency - R35.0   3   Nocturia - R35.1      PLAN:           Orders Labs CULTURE, URINE          Schedule Return Visit/Planned Activity: Next Available Appointment - Urodynamics          Document Letter(s):  Created for Patient: Clinical Summary

## 2021-03-16 ENCOUNTER — Other Ambulatory Visit (HOSPITAL_COMMUNITY)
Admission: RE | Admit: 2021-03-16 | Discharge: 2021-03-16 | Disposition: A | Discharge status: Home / Self Care | Payer: Medicare Other | Source: Ambulatory Visit | Attending: Urology | Admitting: Urology

## 2021-03-16 DIAGNOSIS — Z20822 Contact with and (suspected) exposure to covid-19: Secondary | ICD-10-CM | POA: Diagnosis not present

## 2021-03-16 DIAGNOSIS — Z01812 Encounter for preprocedural laboratory examination: Secondary | ICD-10-CM | POA: Insufficient documentation

## 2021-03-16 LAB — SARS CORONAVIRUS 2 (TAT 6-24 HRS): SARS Coronavirus 2: NEGATIVE

## 2021-03-19 ENCOUNTER — Encounter (HOSPITAL_COMMUNITY): Payer: Self-pay | Admitting: Urology

## 2021-03-19 MED ORDER — VANCOMYCIN HCL 1500 MG/300ML IV SOLN
1500.0000 mg | INTRAVENOUS | Status: AC
  Administered 2021-03-20: 1500 mg via INTRAVENOUS
  Filled 2021-03-19: qty 300

## 2021-03-19 MED ORDER — GENTAMICIN SULFATE 40 MG/ML IJ SOLN
5.0000 mg/kg | INTRAVENOUS | Status: AC
  Administered 2021-03-20: 420 mg via INTRAVENOUS
  Filled 2021-03-19: qty 10.5

## 2021-03-20 ENCOUNTER — Encounter (HOSPITAL_COMMUNITY): Payer: Self-pay | Admitting: Urology

## 2021-03-20 ENCOUNTER — Ambulatory Visit (HOSPITAL_COMMUNITY): Payer: Medicare Other | Admitting: Anesthesiology

## 2021-03-20 ENCOUNTER — Ambulatory Visit (HOSPITAL_COMMUNITY): Payer: Medicare Other | Admitting: Physician Assistant

## 2021-03-20 ENCOUNTER — Observation Stay (HOSPITAL_COMMUNITY)
Admission: RE | Admit: 2021-03-20 | Discharge: 2021-03-21 | Disposition: A | Discharge status: Home / Self Care | Payer: Medicare Other | Source: Ambulatory Visit | Attending: Urology | Admitting: Urology

## 2021-03-20 ENCOUNTER — Encounter (HOSPITAL_COMMUNITY)
Admission: RE | Disposition: A | Discharge status: Home / Self Care | Payer: Self-pay | Source: Ambulatory Visit | Attending: Urology

## 2021-03-20 ENCOUNTER — Other Ambulatory Visit: Payer: Self-pay

## 2021-03-20 DIAGNOSIS — J441 Chronic obstructive pulmonary disease with (acute) exacerbation: Secondary | ICD-10-CM | POA: Diagnosis not present

## 2021-03-20 DIAGNOSIS — Z8546 Personal history of malignant neoplasm of prostate: Secondary | ICD-10-CM | POA: Insufficient documentation

## 2021-03-20 DIAGNOSIS — I509 Heart failure, unspecified: Secondary | ICD-10-CM | POA: Diagnosis not present

## 2021-03-20 DIAGNOSIS — N393 Stress incontinence (female) (male): Secondary | ICD-10-CM | POA: Diagnosis not present

## 2021-03-20 DIAGNOSIS — Z79899 Other long term (current) drug therapy: Secondary | ICD-10-CM | POA: Diagnosis not present

## 2021-03-20 DIAGNOSIS — Z87891 Personal history of nicotine dependence: Secondary | ICD-10-CM | POA: Diagnosis not present

## 2021-03-20 DIAGNOSIS — J449 Chronic obstructive pulmonary disease, unspecified: Secondary | ICD-10-CM | POA: Diagnosis not present

## 2021-03-20 DIAGNOSIS — I5032 Chronic diastolic (congestive) heart failure: Secondary | ICD-10-CM | POA: Diagnosis not present

## 2021-03-20 DIAGNOSIS — N3946 Mixed incontinence: Secondary | ICD-10-CM | POA: Diagnosis not present

## 2021-03-20 DIAGNOSIS — K219 Gastro-esophageal reflux disease without esophagitis: Secondary | ICD-10-CM | POA: Diagnosis not present

## 2021-03-20 DIAGNOSIS — G4733 Obstructive sleep apnea (adult) (pediatric): Secondary | ICD-10-CM | POA: Diagnosis not present

## 2021-03-20 DIAGNOSIS — Z7901 Long term (current) use of anticoagulants: Secondary | ICD-10-CM | POA: Diagnosis not present

## 2021-03-20 DIAGNOSIS — J45909 Unspecified asthma, uncomplicated: Secondary | ICD-10-CM | POA: Diagnosis not present

## 2021-03-20 HISTORY — PX: URINARY SPHINCTER IMPLANT: SHX2624

## 2021-03-20 LAB — CBC
HCT: 44.1 % (ref 39.0–52.0)
Hemoglobin: 14.3 g/dL (ref 13.0–17.0)
MCH: 29.9 pg (ref 26.0–34.0)
MCHC: 32.4 g/dL (ref 30.0–36.0)
MCV: 92.3 fL (ref 80.0–100.0)
Platelets: 148 10*3/uL — ABNORMAL LOW (ref 150–400)
RBC: 4.78 MIL/uL (ref 4.22–5.81)
RDW: 13.8 % (ref 11.5–15.5)
WBC: 12.7 10*3/uL — ABNORMAL HIGH (ref 4.0–10.5)
nRBC: 0 % (ref 0.0–0.2)

## 2021-03-20 LAB — CREATININE, SERUM
Creatinine, Ser: 1.32 mg/dL — ABNORMAL HIGH (ref 0.61–1.24)
GFR, Estimated: 58 mL/min — ABNORMAL LOW (ref >60)

## 2021-03-20 LAB — PROTIME-INR
INR: 1.1 (ref 0.8–1.2)
Prothrombin Time: 13.6 seconds (ref 11.4–15.2)

## 2021-03-20 SURGERY — INSERTION, ARTIFICIAL URINARY SPHINCTER
Anesthesia: General

## 2021-03-20 MED ORDER — DEXTROSE-NACL 5-0.45 % IV SOLN: INTRAVENOUS | Status: DC

## 2021-03-20 MED ORDER — HYDROMORPHONE HCL 1 MG/ML IJ SOLN: 0.5000 mg | INTRAMUSCULAR | Status: DC | PRN

## 2021-03-20 MED ORDER — ACETAMINOPHEN 500 MG PO TABS
1000.0000 mg | ORAL_TABLET | Freq: Once | ORAL | Status: AC
  Administered 2021-03-20: 1000 mg via ORAL
  Filled 2021-03-20: qty 2

## 2021-03-20 MED ORDER — ROSUVASTATIN CALCIUM 10 MG PO TABS
10.0000 mg | ORAL_TABLET | Freq: Every evening | ORAL | Status: DC
  Administered 2021-03-20: 10 mg via ORAL
  Filled 2021-03-20: qty 1

## 2021-03-20 MED ORDER — EPHEDRINE 5 MG/ML INJ
INTRAVENOUS | Status: AC
  Filled 2021-03-20: qty 10

## 2021-03-20 MED ORDER — MIDAZOLAM HCL 2 MG/2ML IJ SOLN
INTRAMUSCULAR | Status: AC
  Filled 2021-03-20: qty 2

## 2021-03-20 MED ORDER — SODIUM CHLORIDE 0.9 % IR SOLN
Status: DC | PRN
  Administered 2021-03-20: 3000 mL via INTRAVESICAL

## 2021-03-20 MED ORDER — PANTOPRAZOLE SODIUM 40 MG PO TBEC
40.0000 mg | DELAYED_RELEASE_TABLET | Freq: Every day | ORAL | Status: DC
  Administered 2021-03-20: 40 mg via ORAL
  Filled 2021-03-20: qty 1

## 2021-03-20 MED ORDER — PHENYTOIN SODIUM EXTENDED 100 MG PO CAPS
100.0000 mg | ORAL_CAPSULE | Freq: Every day | ORAL | Status: DC
  Administered 2021-03-20: 100 mg via ORAL
  Filled 2021-03-20: qty 1

## 2021-03-20 MED ORDER — SUGAMMADEX SODIUM 500 MG/5ML IV SOLN
INTRAVENOUS | Status: DC | PRN
  Administered 2021-03-20: 440 mg via INTRAVENOUS

## 2021-03-20 MED ORDER — GLYCOPYRROLATE PF 0.2 MG/ML IJ SOSY
PREFILLED_SYRINGE | INTRAMUSCULAR | Status: AC
  Filled 2021-03-20: qty 1

## 2021-03-20 MED ORDER — FENTANYL CITRATE (PF) 100 MCG/2ML IJ SOLN
INTRAMUSCULAR | Status: AC
  Filled 2021-03-20: qty 2

## 2021-03-20 MED ORDER — STERILE WATER FOR IRRIGATION IR SOLN
Status: DC | PRN
  Administered 2021-03-20: 1000 mL

## 2021-03-20 MED ORDER — PROPOFOL 10 MG/ML IV BOLUS
INTRAVENOUS | Status: DC | PRN
  Administered 2021-03-20: 200 mg via INTRAVENOUS

## 2021-03-20 MED ORDER — ORAL CARE MOUTH RINSE
15.0000 mL | Freq: Once | OROMUCOSAL | Status: AC
  Administered 2021-03-20: 15 mL via OROMUCOSAL

## 2021-03-20 MED ORDER — FLUTICASONE FUROATE-VILANTEROL 100-25 MCG/INH IN AEPB
1.0000 | INHALATION_SPRAY | Freq: Every day | RESPIRATORY_TRACT | Status: DC
  Administered 2021-03-20: 1 via RESPIRATORY_TRACT
  Filled 2021-03-20: qty 28

## 2021-03-20 MED ORDER — POTASSIUM CHLORIDE CRYS ER 20 MEQ PO TBCR
20.0000 meq | EXTENDED_RELEASE_TABLET | Freq: Every day | ORAL | Status: DC
  Administered 2021-03-20: 20 meq via ORAL
  Filled 2021-03-20: qty 1

## 2021-03-20 MED ORDER — ACETAMINOPHEN 325 MG PO TABS
650.0000 mg | ORAL_TABLET | ORAL | Status: DC | PRN
  Administered 2021-03-20: 650 mg via ORAL
  Filled 2021-03-20: qty 2

## 2021-03-20 MED ORDER — FENTANYL CITRATE (PF) 100 MCG/2ML IJ SOLN
25.0000 ug | INTRAMUSCULAR | Status: DC | PRN
  Administered 2021-03-20: 50 ug via INTRAVENOUS
  Administered 2021-03-20 (×3): 25 ug via INTRAVENOUS

## 2021-03-20 MED ORDER — DIPHENHYDRAMINE HCL 50 MG/ML IJ SOLN: 12.5000 mg | Freq: Four times a day (QID) | INTRAMUSCULAR | Status: DC | PRN

## 2021-03-20 MED ORDER — LIDOCAINE HCL (PF) 2 % IJ SOLN
INTRAMUSCULAR | Status: DC | PRN
  Administered 2021-03-20: 1 mg/kg/h via INTRADERMAL

## 2021-03-20 MED ORDER — UMECLIDINIUM BROMIDE 62.5 MCG/INH IN AEPB
1.0000 | INHALATION_SPRAY | Freq: Every day | RESPIRATORY_TRACT | Status: DC
  Administered 2021-03-21: 1 via RESPIRATORY_TRACT
  Filled 2021-03-20: qty 7

## 2021-03-20 MED ORDER — EPHEDRINE SULFATE-NACL 50-0.9 MG/10ML-% IV SOSY
PREFILLED_SYRINGE | INTRAVENOUS | Status: DC | PRN
  Administered 2021-03-20 (×2): 5 mg via INTRAVENOUS

## 2021-03-20 MED ORDER — ROCURONIUM BROMIDE 10 MG/ML (PF) SYRINGE
PREFILLED_SYRINGE | INTRAVENOUS | Status: AC
  Filled 2021-03-20: qty 20

## 2021-03-20 MED ORDER — SODIUM CHLORIDE 0.9 % IR SOLN
Status: DC | PRN
  Administered 2021-03-20: 1000 mL

## 2021-03-20 MED ORDER — FENTANYL CITRATE (PF) 100 MCG/2ML IJ SOLN
INTRAMUSCULAR | Status: DC | PRN
  Administered 2021-03-20: 50 ug via INTRAVENOUS
  Administered 2021-03-20: 100 ug via INTRAVENOUS
  Administered 2021-03-20 (×2): 50 ug via INTRAVENOUS

## 2021-03-20 MED ORDER — ALLOPURINOL 100 MG PO TABS
100.0000 mg | ORAL_TABLET | Freq: Two times a day (BID) | ORAL | Status: DC
  Administered 2021-03-20: 100 mg via ORAL
  Filled 2021-03-20: qty 1

## 2021-03-20 MED ORDER — ONDANSETRON HCL 4 MG/2ML IJ SOLN
INTRAMUSCULAR | Status: AC
  Filled 2021-03-20: qty 4

## 2021-03-20 MED ORDER — LEFLUNOMIDE 20 MG PO TABS
20.0000 mg | ORAL_TABLET | Freq: Every evening | ORAL | Status: DC
  Administered 2021-03-20: 20 mg via ORAL
  Filled 2021-03-20 (×2): qty 1

## 2021-03-20 MED ORDER — OXYBUTYNIN CHLORIDE 5 MG PO TABS: 5.0000 mg | ORAL_TABLET | Freq: Three times a day (TID) | ORAL | Status: DC | PRN

## 2021-03-20 MED ORDER — ONDANSETRON HCL 4 MG/2ML IJ SOLN: 4.0000 mg | Freq: Once | INTRAMUSCULAR | Status: DC | PRN

## 2021-03-20 MED ORDER — LIDOCAINE 2% (20 MG/ML) 5 ML SYRINGE
INTRAMUSCULAR | Status: AC
  Filled 2021-03-20: qty 15

## 2021-03-20 MED ORDER — FUROSEMIDE 40 MG PO TABS
40.0000 mg | ORAL_TABLET | Freq: Every evening | ORAL | Status: DC
  Administered 2021-03-20: 40 mg via ORAL
  Filled 2021-03-20: qty 1

## 2021-03-20 MED ORDER — LIDOCAINE 2% (20 MG/ML) 5 ML SYRINGE
INTRAMUSCULAR | Status: AC
  Filled 2021-03-20: qty 10

## 2021-03-20 MED ORDER — LIDOCAINE 2% (20 MG/ML) 5 ML SYRINGE
INTRAMUSCULAR | Status: DC | PRN
  Administered 2021-03-20: 80 mg via INTRAVENOUS
  Administered 2021-03-20: 20 mg via INTRAVENOUS

## 2021-03-20 MED ORDER — OXYCODONE HCL 5 MG PO TABS: 5.0000 mg | ORAL_TABLET | ORAL | Status: DC | PRN

## 2021-03-20 MED ORDER — DEXAMETHASONE SODIUM PHOSPHATE 10 MG/ML IJ SOLN
INTRAMUSCULAR | Status: DC | PRN
  Administered 2021-03-20: 5 mg via INTRAVENOUS

## 2021-03-20 MED ORDER — DOCUSATE SODIUM 100 MG PO CAPS
100.0000 mg | ORAL_CAPSULE | Freq: Two times a day (BID) | ORAL | Status: DC
Start: 2021-03-20 — End: 2021-03-21
  Administered 2021-03-20: 100 mg via ORAL
  Filled 2021-03-20: qty 1

## 2021-03-20 MED ORDER — SUGAMMADEX SODIUM 500 MG/5ML IV SOLN
INTRAVENOUS | Status: AC
  Filled 2021-03-20: qty 5

## 2021-03-20 MED ORDER — PROPOFOL 10 MG/ML IV BOLUS
INTRAVENOUS | Status: AC
  Filled 2021-03-20: qty 60

## 2021-03-20 MED ORDER — DEXAMETHASONE SODIUM PHOSPHATE 10 MG/ML IJ SOLN
INTRAMUSCULAR | Status: AC
  Filled 2021-03-20: qty 2

## 2021-03-20 MED ORDER — PHENYTOIN SODIUM EXTENDED 100 MG PO CAPS
200.0000 mg | ORAL_CAPSULE | Freq: Every day | ORAL | Status: DC
  Administered 2021-03-20: 200 mg via ORAL
  Filled 2021-03-20: qty 2

## 2021-03-20 MED ORDER — ENOXAPARIN SODIUM 40 MG/0.4ML ~~LOC~~ SOLN: 40.0000 mg | SUBCUTANEOUS | Status: DC

## 2021-03-20 MED ORDER — HYDROCODONE-ACETAMINOPHEN 5-325 MG PO TABS: 1.0000 | ORAL_TABLET | Freq: Four times a day (QID) | ORAL | 0 refills | Status: DC | PRN

## 2021-03-20 MED ORDER — ONDANSETRON HCL 4 MG/2ML IJ SOLN: 4.0000 mg | INTRAMUSCULAR | Status: DC | PRN

## 2021-03-20 MED ORDER — PHENYTOIN SODIUM EXTENDED 100 MG PO CAPS: 100.0000 mg | ORAL_CAPSULE | ORAL | Status: DC

## 2021-03-20 MED ORDER — POTASSIUM CHLORIDE ER 20 MEQ PO TBCR: 20.0000 meq | EXTENDED_RELEASE_TABLET | Freq: Every day | ORAL | Status: DC

## 2021-03-20 MED ORDER — BACITRACIN-NEOMYCIN-POLYMYXIN 400-5-5000 EX OINT: 1.0000 "application " | TOPICAL_OINTMENT | Freq: Three times a day (TID) | CUTANEOUS | Status: DC | PRN

## 2021-03-20 MED ORDER — CHLORHEXIDINE GLUCONATE 0.12 % MT SOLN: 15.0000 mL | Freq: Once | OROMUCOSAL | Status: AC

## 2021-03-20 MED ORDER — LACTATED RINGERS IV SOLN
INTRAVENOUS | Status: DC
  Administered 2021-03-20: 1000 mL via INTRAVENOUS

## 2021-03-20 MED ORDER — DICYCLOMINE HCL 10 MG PO CAPS
20.0000 mg | ORAL_CAPSULE | Freq: Two times a day (BID) | ORAL | Status: DC
  Administered 2021-03-20: 20 mg via ORAL
  Filled 2021-03-20: qty 2

## 2021-03-20 MED ORDER — ALBUTEROL SULFATE (2.5 MG/3ML) 0.083% IN NEBU: 2.5000 mg | INHALATION_SOLUTION | Freq: Four times a day (QID) | RESPIRATORY_TRACT | Status: DC | PRN

## 2021-03-20 MED ORDER — PHENYLEPHRINE HCL-NACL 10-0.9 MG/250ML-% IV SOLN
INTRAVENOUS | Status: DC | PRN
  Administered 2021-03-20: 15 ug/min via INTRAVENOUS

## 2021-03-20 MED ORDER — IPRATROPIUM-ALBUTEROL 0.5-2.5 (3) MG/3ML IN SOLN
3.0000 mL | Freq: Once | RESPIRATORY_TRACT | Status: AC
  Administered 2021-03-20: 3 mL via RESPIRATORY_TRACT
  Filled 2021-03-20: qty 3

## 2021-03-20 MED ORDER — DIPHENHYDRAMINE HCL 12.5 MG/5ML PO ELIX: 12.5000 mg | ORAL_SOLUTION | Freq: Four times a day (QID) | ORAL | Status: DC | PRN

## 2021-03-20 MED ORDER — ONDANSETRON HCL 4 MG/2ML IJ SOLN
INTRAMUSCULAR | Status: DC | PRN
  Administered 2021-03-20: 4 mg via INTRAVENOUS

## 2021-03-20 MED ORDER — ROCURONIUM BROMIDE 10 MG/ML (PF) SYRINGE
PREFILLED_SYRINGE | INTRAVENOUS | Status: DC | PRN
  Administered 2021-03-20 (×3): 10 mg via INTRAVENOUS
  Administered 2021-03-20: 20 mg via INTRAVENOUS
  Administered 2021-03-20: 10 mg via INTRAVENOUS
  Administered 2021-03-20 (×2): 20 mg via INTRAVENOUS
  Administered 2021-03-20: 30 mg via INTRAVENOUS
  Administered 2021-03-20: 20 mg via INTRAVENOUS

## 2021-03-20 SURGICAL SUPPLY — 60 items
ADH SKN CLS APL DERMABOND .7 (GAUZE/BANDAGES/DRESSINGS) ×2
BAG DECANTER FOR FLEXI CONT (MISCELLANEOUS) ×2 IMPLANT
BAG DRN RND TRDRP ANRFLXCHMBR (UROLOGICAL SUPPLIES) ×1
BAG URINE DRAIN 2000ML AR STRL (UROLOGICAL SUPPLIES) ×2 IMPLANT
BALLOON PRESSURE REGUL 61 70CM (Miscellaneous) IMPLANT
BLADE SURG 15 STRL LF DISP TIS (BLADE) ×2 IMPLANT
BLADE SURG 15 STRL SS (BLADE) ×4
BNDG CONFORM 4 STRL LF (GAUZE/BANDAGES/DRESSINGS) ×1 IMPLANT
BNDG GAUZE ELAST 4 BULKY (GAUZE/BANDAGES/DRESSINGS) ×2 IMPLANT
BRIEF STRETCH FOR OB PAD LRG (UNDERPADS AND DIAPERS) ×2 IMPLANT
CATH FOLEY 2WAY SLVR  5CC 14FR (CATHETERS)
CATH FOLEY 2WAY SLVR  5CC 22FR (CATHETERS) ×2
CATH FOLEY 2WAY SLVR 5CC 14FR (CATHETERS) ×1 IMPLANT
CATH FOLEY 2WAY SLVR 5CC 22FR (CATHETERS) IMPLANT
CONTROL PUMP (Urological Implant) ×1 IMPLANT
COVER MAYO STAND STRL (DRAPES) ×3 IMPLANT
COVER SURGICAL LIGHT HANDLE (MISCELLANEOUS) ×1 IMPLANT
COVER WAND RF STERILE (DRAPES) IMPLANT
CUFF URINARY OCCL 4.5 IZ (Urological Implant) ×1 IMPLANT
DECANTER SPIKE VIAL GLASS SM (MISCELLANEOUS) ×6 IMPLANT
DERMABOND ADVANCED (GAUZE/BANDAGES/DRESSINGS) ×2
DERMABOND ADVANCED .7 DNX12 (GAUZE/BANDAGES/DRESSINGS) ×2 IMPLANT
DISSECTOR ROUND CHERRY 3/8 STR (MISCELLANEOUS) ×2 IMPLANT
DRAPE SHEET LG 3/4 BI-LAMINATE (DRAPES) IMPLANT
DRAPE UNDERBUTTOCKS STRL (DISPOSABLE) ×2 IMPLANT
DRSG TEGADERM 4X4.75 (GAUZE/BANDAGES/DRESSINGS) ×4 IMPLANT
DRSG TEGADERM 6X8 (GAUZE/BANDAGES/DRESSINGS) ×1 IMPLANT
DRSG TELFA 3X8 NADH (GAUZE/BANDAGES/DRESSINGS) ×2 IMPLANT
DRSG TELFA PLUS 4X6 ADH ISLAND (GAUZE/BANDAGES/DRESSINGS) ×1 IMPLANT
ELECT PENCIL ROCKER SW 15FT (MISCELLANEOUS) ×1 IMPLANT
ELECT REM PT RETURN 15FT ADLT (MISCELLANEOUS) ×2 IMPLANT
GAUZE 4X4 16PLY RFD (DISPOSABLE) ×4 IMPLANT
GLOVE SURG ENC MOIS LTX SZ6.5 (GLOVE) ×2 IMPLANT
GLOVE SURG ENC TEXT LTX SZ7.5 (GLOVE) ×2 IMPLANT
GOWN STRL REUS W/TWL LRG LVL3 (GOWN DISPOSABLE) ×2 IMPLANT
GOWN STRL REUS W/TWL XL LVL3 (GOWN DISPOSABLE) ×2 IMPLANT
KIT ACCESSORY AMS 800 ×1 IMPLANT
KIT BASIN OR (CUSTOM PROCEDURE TRAY) ×2 IMPLANT
KIT TURNOVER KIT A (KITS) ×2 IMPLANT
LOOP VESSEL MAXI BLUE (MISCELLANEOUS) ×2 IMPLANT
PACK CYSTO (CUSTOM PROCEDURE TRAY) ×2 IMPLANT
PAD DRESSING TELFA 3X8 NADH (GAUZE/BANDAGES/DRESSINGS) ×1 IMPLANT
PENCIL SMOKE EVACUATOR (MISCELLANEOUS) IMPLANT
PLUG CATH AND CAP STER (CATHETERS) ×2 IMPLANT
PRESS REG BALL 61 70CM (Miscellaneous) ×2 IMPLANT
PROTECTOR NERVE ULNAR (MISCELLANEOUS) ×2 IMPLANT
SHEET LAVH (DRAPES) ×2 IMPLANT
SUT MNCRL AB 4-0 PS2 18 (SUTURE) ×4 IMPLANT
SUT SILK 0 FSL (SUTURE) ×2 IMPLANT
SUT VIC AB 0 CT1 27 (SUTURE) ×10
SUT VIC AB 0 CT1 27XBRD ANTBC (SUTURE) ×1 IMPLANT
SUT VIC AB 3-0 SH 27 (SUTURE) ×8
SUT VIC AB 3-0 SH 27X BRD (SUTURE) ×4 IMPLANT
SYR 10ML LL (SYRINGE) ×4 IMPLANT
SYR 20ML LL LF (SYRINGE) ×2 IMPLANT
SYR 30ML LL (SYRINGE) ×2 IMPLANT
TOWEL OR 17X26 10 PK STRL BLUE (TOWEL DISPOSABLE) ×2 IMPLANT
TOWEL OR NON WOVEN STRL DISP B (DISPOSABLE) ×2 IMPLANT
TUBING CONNECTING 10 (TUBING) ×2 IMPLANT
YANKAUER SUCT BULB TIP 10FT TU (MISCELLANEOUS) ×1 IMPLANT

## 2021-03-20 NOTE — Interval H&P Note (Signed)
History and Physical Interval Note:  03/20/2021 7:11 AM  Corlis Hove  has presented today for surgery, with the diagnosis of STRESS, INCONTINENCE.  The various methods of treatment have been discussed with the patient and family. After consideration of risks, benefits and other options for treatment, the patient has consented to  Procedure(s) with comments: ARTIFICIAL URINARY SPHINCTER CYSTOSCOPY (N/A) - REQUESTING 90 MINS as a surgical intervention.  The patient's history has been reviewed, patient examined, no change in status, stable for surgery.  I have reviewed the patient's chart and labs.  Questions were answered to the patient's satisfaction.     Shanard Treto A Nuri Branca

## 2021-03-20 NOTE — Anesthesia Procedure Notes (Signed)
Procedure Name: Intubation Date/Time: 03/20/2021 7:45 AM Performed by: Lavina Hamman, CRNA Pre-anesthesia Checklist: Patient identified, Emergency Drugs available, Suction available, Patient being monitored and Timeout performed Patient Re-evaluated:Patient Re-evaluated prior to induction Oxygen Delivery Method: Circle system utilized Preoxygenation: Pre-oxygenation with 100% oxygen Induction Type: IV induction Ventilation: Mask ventilation without difficulty Laryngoscope Size: Mac and 4 Grade View: Grade II Tube type: Oral Tube size: 7.5 mm Number of attempts: 1 Airway Equipment and Method: Stylet Placement Confirmation: ETT inserted through vocal cords under direct vision,  positive ETCO2,  CO2 detector and breath sounds checked- equal and bilateral Secured at: 22 cm Tube secured with: Tape Dental Injury: Teeth and Oropharynx as per pre-operative assessment  Comments: ATOI, teeth unchanged.

## 2021-03-20 NOTE — Transfer of Care (Signed)
Immediate Anesthesia Transfer of Care Note  Patient: [name]  Procedure(s) Performed: Procedure(s) with comments: ARTIFICIAL URINARY SPHINCTER CYSTOSCOPY (N/A) - REQUESTING 15 MINS  Patient Location: PACU  Anesthesia Type:General  Level of Consciousness:  sedated, patient cooperative and responds to stimulation  Airway & Oxygen Therapy:Patient Spontanous Breathing and Patient connected to face mask oxgen  Post-op Assessment:  Report given to PACU RN and Post -op Vital signs reviewed and stable  Post vital signs:  Reviewed and stable  Last Vitals:  Vitals:   03/20/21 0610  BP: (!) 162/95  Pulse: 68  Resp: 16  Temp: 36.7 C  SpO2: 76%    Complications: No apparent anesthesia complications

## 2021-03-20 NOTE — Op Note (Signed)
Preoperative diagnosis: Stress urinary incontinence Postoperative diagnosis: Stress urinary incontinence Surgery: Implantation of artificial urinary sphincter and cystoscopy Surgeon: Dr. Nicki Reaper Macdiarmid Assistant: Debbrah Alar  The patient has the above diagnosis and consented the above procedure.  Complicating factors were noted preoperatively.  He had a sterile urine culture.  Preoperative antibiotics given.  He had a breathing treatment.  Coumadin had been stopped.  Extra care was taken with leg positioning to minimize the risk of compartment syndrome and neuropathy and deep vein thrombosis.  I took time to mark where I would make the right lower quadrant incision because it is significant obesity of the abdomen.  His abdomen was at the level of the inguinal canal.  I initially cystoscoped the patient.  There was no stone in the bladder neck.  Bladder neck was approximately 2 [place].  He had radiation changes of his mucosa previously described.  I made a 5 to 6 cm perineal incision.  His perineum was short.  Dissected down through subcutaneous tissue mobilizing subcutaneous tissue from the underlying bulbospongiosus muscle.  Muscle split in the midline.  His urethra with a 23 French Foley catheter was quite closely adhered.  I did my box technique.  There is a lot of soft tissue in that area.  I incised Buck's fascia.  I could mobilize the bulbar urethra very nicely following that maneuver.  I placed a right angle at the level of tunica albuginea and brought it through the sale of tissue dorsal to the urethra and opened the tip with cutting current.  I then did my usual dissection opening up a nice opening for the cuff.  There was no urethral injury.  I was very pleased with this dissection.  He had a very large urethra and we measured 4.5 cm.  The dissection was at the level of the bifurcation of the corporal bodies.  Sterile wet guaze applied to perineum.  He was very deep but the dissection went  very well  Again using bony and soft tissue landmarks I marked out approximately a 7 cm right lower quadrant incision.  I dissected down through significant subcutaneous tissue and fat exposing the external oblique aponeurosis.  We mobilized very well to aid in the dissection.  We had to use mid size right angle retractors and a curved cerebellar.  I incised the external oblique aponeurosis along its fibers several centimeters for exposure.  I used a large Kelly to separate the muscle.  The anatomy at this point looked excellent.  I thought immediately I could see the preperitoneal fascia.  I did gentle finger dissection.  In my opinion I went through the peritoneum  the size of a fingertip.  There was no injury to bowel. In retrospect there really was not a preperitoneal space possibly from obesity.  I did not try to close the peritoneum.  I placed the well-prepared reservoir through the small opening and filled it with 25 cc of normal saline.  It plugged the opening very nicely.  Recognizing its limitations I safely placed 2 interrupted 2-0 Vicryl suture through the muscle near the reservoir to help minimize any risk of hernia.  His fascia was closed with 0 Vicryl running suture using 2 of them 1 above and 1 below the tubing.  He had nice thick fascia.  Carefully I then placed 5 interrupted 2-0 Vicryl sutures spacing them out nicely to give more security to the fascial closure recognizing the possible opening in the peritoneum and his obesity and body  weight in that location.  At this stage I dissected down from the abdominal incision to the right hemiscrotum.  It took probably 15 minutes using a long Bovie tip to dissect down to the right scrotum with a wide enough path.  His fat was very adherent possibly from radiation.  I actually had to extend his abdominal incision 2 cm medially so I can get a good exposure and mobilization of the scrotum.  I used my usual technique where I delivered the scrotum upward  and used a peanut to mobilize on the tip of my finger.  I did this maneuver 3 times.  In spite of this I had difficulty passing the pump to the right hemiscrotum with my sponge forcep.  It would not reach the dependent part of the scrotum.  I finally did some finger dissection.  I was using very large retractors to expose the area.  I was able to finger dissect the right hemiscrotum and then deliver the pump.  It was 1 cm higher than my planned position but is very pleased with it.  The tubing was actually curling a little bit in the scrotum.  With the described technique a 4.5 cm cuff was then placed around the urethra.  It locked in nicely.  I cut off the tab as described.  Tubing was brought from perineum to abdominal incision using tubing passer.  The cuff was positioned very nicely.  With the described technique we connected it tubings appropriately with the quick connect.  All tubings were irrigated.  We almost did not cut much tubing because of his significant length and obesity.  The tubing laid in nicely near his fascia.  I closed the right lower quadrant with running 3-0 Vicryl followed by 4-0 subcuticular Vicryl  In the perineum I cycled the device 3 times and deactivated it with half a dimple.  I closed my 3 layers with running 3-0 Vicryl the first layer being the bulbospongiosus muscle and some upper scrotal soft tissue.  4-0 subcuticular was used for the skin.  Sterile dressing was applied.  Pump was in good position.  It was dimpled.  Leg position was good.  Urine output was good.

## 2021-03-20 NOTE — Plan of Care (Signed)
Care plan initiated.

## 2021-03-21 ENCOUNTER — Encounter (HOSPITAL_COMMUNITY): Payer: Self-pay | Admitting: Urology

## 2021-03-21 DIAGNOSIS — N393 Stress incontinence (female) (male): Secondary | ICD-10-CM | POA: Diagnosis not present

## 2021-03-21 DIAGNOSIS — I509 Heart failure, unspecified: Secondary | ICD-10-CM | POA: Diagnosis not present

## 2021-03-21 DIAGNOSIS — J45909 Unspecified asthma, uncomplicated: Secondary | ICD-10-CM | POA: Diagnosis not present

## 2021-03-21 DIAGNOSIS — Z87891 Personal history of nicotine dependence: Secondary | ICD-10-CM | POA: Diagnosis not present

## 2021-03-21 DIAGNOSIS — Z8546 Personal history of malignant neoplasm of prostate: Secondary | ICD-10-CM | POA: Diagnosis not present

## 2021-03-21 DIAGNOSIS — J449 Chronic obstructive pulmonary disease, unspecified: Secondary | ICD-10-CM | POA: Diagnosis not present

## 2021-03-21 LAB — BASIC METABOLIC PANEL
Anion gap: 8 (ref 5–15)
BUN: 16 mg/dL (ref 8–23)
CO2: 25 mmol/L (ref 22–32)
Calcium: 8.2 mg/dL — ABNORMAL LOW (ref 8.9–10.3)
Chloride: 107 mmol/L (ref 98–111)
Creatinine, Ser: 1.21 mg/dL (ref 0.61–1.24)
GFR, Estimated: >60 mL/min (ref >60)
Glucose, Bld: 129 mg/dL — ABNORMAL HIGH (ref 70–99)
Potassium: 3.7 mmol/L (ref 3.5–5.1)
Sodium: 140 mmol/L (ref 135–145)

## 2021-03-21 LAB — HEMOGLOBIN AND HEMATOCRIT, BLOOD
HCT: 40.2 % (ref 39.0–52.0)
Hemoglobin: 12.8 g/dL — ABNORMAL LOW (ref 13.0–17.0)

## 2021-03-21 MED ORDER — CHLORHEXIDINE GLUCONATE CLOTH 2 % EX PADS: 6.0000 | MEDICATED_PAD | Freq: Every day | CUTANEOUS | Status: DC

## 2021-03-21 MED ORDER — WARFARIN SODIUM 3 MG PO TABS: 3.0000 mg | ORAL_TABLET | Freq: Every day | ORAL | 0 refills | Status: DC

## 2021-03-21 NOTE — Plan of Care (Signed)
  Problem: Health Behavior/Discharge Planning: Goal: Ability to manage health-related needs will improve Outcome: Progressing   Problem: Elimination: Goal: Will not experience complications related to urinary retention Outcome: Progressing   Problem: Pain Managment: Goal: General experience of comfort will improve Outcome: Progressing   Problem: Activity: Goal: Risk for activity intolerance will decrease Outcome: Progressing

## 2021-03-21 NOTE — Progress Notes (Signed)
Agree with resident note Post op instructions given Call tomorrow and send home today

## 2021-03-21 NOTE — Progress Notes (Signed)
1 Day Post-Op Subjective: Overall doing well. Patient reports mild irritation around foley catheter, but otherwise, pain controlled. Tolerating regular diet without nausea/emesis. Passing flatus. Ambulated several times.   Objective: Vital signs in last 24 hours: Temp:  [97.5 F (36.4 C)-98.3 F (36.8 C)] 97.8 F (36.6 C) (03/23 0530) Pulse Rate:  [68-89] 75 (03/23 0530) Resp:  [12-21] 20 (03/23 0530) BP: (122-176)/(62-93) 122/65 (03/23 0530) SpO2:  [92 %-100 %] 95 % (03/23 0530) Weight:  [105.1 kg] 105.1 kg (03/22 2218)  Intake/Output from previous day: 03/22 0701 - 03/23 0700 In: 869 [I.V.:869] Out: 1550 [Urine:1525; Blood:25] Intake/Output this shift: No intake/output data recorded.  Physical Exam:  General: Alert and oriented, laying in bed in NAD CV: Regular rate Lungs: NWOB on RA Abdomen: Soft, nondistended, appropriately tender surrounding RLQ incision. Incision intact with mild surrounding ecchymosis; no signs of infection; covered with dermabond GU: Foley catheter draining yellow urine. AUS pump with slight dimple. Mild scrotal and penile ecchymosis. Perineal incision intact and covered with dermabond; no signs of infection  Extremities: Warm and well-perfused  Lab Results: Recent Labs    03/20/21 1847 03/21/21 0409  HGB 14.3 12.8*  HCT 44.1 40.2   BMET Recent Labs    03/20/21 1847 03/21/21 0409  NA  --  140  K  --  3.7  CL  --  107  CO2  --  25  GLUCOSE  --  129*  BUN  --  16  CREATININE 1.32* 1.21  CALCIUM  --  8.2*     Studies/Results: No results found.  Assessment/Plan: 70yo male with urinary incontinence after robotic radical prostatectomy and salvage radiation ~2019 who is now POD1 s/p AUS placement with Dr. Matilde Sprang on 03/20/21. Doing well.  - Regular diet. Stop IVF. Bowel regimen - Remove foley catheter and trial of void/ensure patient leaking at his baseline - Lovenox for prophylaxis given history of DVT 2016. Resume home Coumadin on  POD2 - IS, encourage ambulation - PRN analgesics and anti-emetics - Anticipate discharge later today     LOS: 0 days   Celene Squibb 03/21/2021, 7:12 AM

## 2021-03-21 NOTE — Discharge Summary (Signed)
Date of admission: 03/20/2021  Date of discharge: 03/21/2021  Admission diagnosis: stress incontinence  Discharge diagnosis: stress incontinence  Secondary diagnoses: prostate cancer  History and Physical: For full details, please see admission history and physical. Briefly, [name] is a 70 y.o. year old patient with the above diagnosis.   Hospital Course: Implantation of artificial sphincter. Surgery went well. Good post op course  Laboratory values: Recent Labs    03/20/21 1847 03/21/21 0409  HGB 14.3 12.8*  HCT 44.1 40.2   Recent Labs    03/20/21 1847 03/21/21 0409  CREATININE 1.32* 1.21    Disposition: Home  Discharge instruction: The patient was instructed to be ambulatory but told to refrain from heavy lifting, strenuous activity, or driving. Detailed  Discharge medications:  Allergies as of 03/21/2021      Reactions   Orencia [abatacept] Anaphylaxis   Had prostate cancer   Carbamazepine Rash   REACTION: makes drowsy BRAND NAME IS TEGRETOL   Celecoxib Itching, Rash   BRAND NAME IS CELEBREX   Cephalexin Rash   "Severe Rash".  BRAND NAME IS KEFLEX.    Enbrel [etanercept] Rash   Humira [adalimumab] Rash   Levofloxacin Other (See Comments)   REACTION: GI Intolerance BRAND NAME IS LEVAQUIN   Sulfa Antibiotics Rash   Sulfasalazine Rash      Medication List    STOP taking these medications   cholecalciferol 25 MCG (1000 UNIT) tablet Commonly known as: VITAMIN D3   CITRACAL +D3 PO   folic acid 1 MG tablet Commonly known as: FOLVITE   PROBIOTIC DAILY PO   warfarin 3 MG tablet Commonly known as: COUMADIN     TAKE these medications   acetaminophen 650 MG CR tablet Commonly known as: TYLENOL Take 1,300 mg by mouth every 8 (eight) hours as needed for pain.   ACTEMRA IV Inject 4 mg/kg into the vein every 28 (twenty-eight) days. For Rheumatoid Arthritis. Last ordered 06/22/20 x 2 doses   albuterol (2.5 MG/3ML) 0.083% nebulizer  solution Commonly known as: PROVENTIL Take 3 mLs (2.5 mg total) by nebulization every 6 (six) hours as needed for wheezing or shortness of breath.   alendronate 70 MG tablet Commonly known as: FOSAMAX TAKE WITH A FULL GLASS OF WATER ON AN EMPTY STOMACH What changed: See the new instructions.   allopurinol 100 MG tablet Commonly known as: ZYLOPRIM TAKE 1 TABLET BY MOUTH TWICE A DAY   Breo Ellipta 100-25 MCG/INH Aepb Generic drug: fluticasone furoate-vilanterol USE 1 INHALATION ORALLY    DAILY What changed: See the new instructions.   diclofenac Sodium 1 % Gel Commonly known as: VOLTAREN Apply 2-4 grams to affected joint 4 times daily as needed. What changed:   how much to take  how to take this  when to take this  reasons to take this   dicyclomine 10 MG capsule Commonly known as: BENTYL TAKE 1 CAPSULE BY MOUTH 4 TIMES DAILY BEFORE MEALS AND AT BEDTIME What changed: See the new instructions.   furosemide 40 MG tablet Commonly known as: LASIX TAKE 1 TABLET BY MOUTH TWICE A DAY What changed: when to take this   HYDROcodone-acetaminophen 5-325 MG tablet Commonly known as: Norco Take 1-2 tablets by mouth every 6 (six) hours as needed for moderate pain.   Incruse Ellipta 62.5 MCG/INH Aepb Generic drug: umeclidinium bromide INHALE 1 PUFF BY MOUTH EVERY DAY What changed: See the new instructions.   leflunomide 20 MG tablet Commonly known as: ARAVA TAKE 1 TABLET BY  MOUTH EVERY DAY What changed: when to take this   omeprazole 40 MG capsule Commonly known as: PRILOSEC Take 40 mg by mouth at bedtime.   oxybutynin 10 MG 24 hr tablet Commonly known as: DITROPAN-XL Take 10 mg by mouth daily.   phenytoin 100 MG ER capsule Commonly known as: DILANTIN Take 100-200 mg by mouth See admin instructions. Take one capsule in the morning and 2 capsules at bedtime   Potassium Chloride ER 20 MEQ Tbcr Take 20 mEq by mouth daily.   rosuvastatin 10 MG tablet Commonly known  as: CRESTOR Take 10 mg by mouth daily.       Followup:   Follow-up Information    Gricel Copen, Nicki Reaper, MD.   Specialty: Urology Why: office will call you with date and time of appt.  Contact information: [place]  [id number] [phone]

## 2021-03-21 NOTE — Discharge Instructions (Signed)
As discussed with Dr. Matilde Sprang.  You may resume aspirin, advil, aleve, vitamins, and supplements 7 days after surgery.  YOU MAY RESUME COUMADIN 48 HOURS AFTER SURGERY.  Remove abdominal dressing on Sunday  Place telfa pad between perineal incision and pad  Change pads frequently  Try to pull pump downward in scrotum daily

## 2021-03-22 NOTE — Anesthesia Postprocedure Evaluation (Signed)
Anesthesia Post Note  Patient: [name]  Procedure(s) Performed: ARTIFICIAL URINARY SPHINCTER CYSTOSCOPY (N/A )     Patient location during evaluation: PACU Anesthesia Type: General Level of consciousness: sedated and patient cooperative Pain management: pain level controlled Vital Signs Assessment: post-procedure vital signs reviewed and stable Respiratory status: spontaneous breathing Cardiovascular status: stable Anesthetic complications: no   No complications documented.  Last Vitals:  Vitals:   03/21/21 0209 03/21/21 0530  BP: 139/62 122/65  Pulse: 79 75  Resp: 18 20  Temp: 36.7 C 36.6 C  SpO2: 95% 95%    Last Pain:  Vitals:   03/21/21 1000  TempSrc:   PainSc: 0-No pain                 Nolon Nations

## 2021-03-26 ENCOUNTER — Other Ambulatory Visit: Payer: Self-pay

## 2021-03-26 ENCOUNTER — Telehealth: Payer: Self-pay | Admitting: Rheumatology

## 2021-03-26 ENCOUNTER — Encounter: Payer: Self-pay | Admitting: Pulmonary Disease

## 2021-03-26 ENCOUNTER — Ambulatory Visit (INDEPENDENT_AMBULATORY_CARE_PROVIDER_SITE_OTHER): Payer: Medicare Other | Admitting: Pulmonary Disease

## 2021-03-26 VITALS — BP 140/84 | HR 66 | Temp 98.2°F | Ht 69.0 in | Wt 226.0 lb

## 2021-03-26 DIAGNOSIS — J441 Chronic obstructive pulmonary disease with (acute) exacerbation: Secondary | ICD-10-CM | POA: Diagnosis not present

## 2021-03-26 DIAGNOSIS — G4733 Obstructive sleep apnea (adult) (pediatric): Secondary | ICD-10-CM | POA: Diagnosis not present

## 2021-03-26 DIAGNOSIS — J479 Bronchiectasis, uncomplicated: Secondary | ICD-10-CM | POA: Diagnosis not present

## 2021-03-26 MED ORDER — TRELEGY ELLIPTA 100-62.5-25 MCG/INH IN AEPB: 1.0000 | INHALATION_SPRAY | Freq: Every day | RESPIRATORY_TRACT | 0 refills | Status: DC

## 2021-03-26 NOTE — Progress Notes (Signed)
   Subjective:    Patient ID: [name], male    DOB: 1951/10/11, 70 y.o.   MRN: [id number]  HPI  70 yo remote smoker for FU of COPD and severe obstructive sleep apnea PMH: GERD, nausea, chronic anticoagulation for DVT, rheumatoid arthritis (on Actemra), DVT, CHF, history of prostate cancer, Serratia sepsis (June/2020) Covid pneumonia 2020  Chief Complaint  Patient presents with  . Follow-up    Occasional dry cough and wheeze. No complaints on CPAP, states it is working well.    Last OV with me 09/2019 reviewed .  He had persistent obstructive events on auto CPAP and this was changed to 12 to 15 cm.  He denies any sleep-related problems today and reports that he is resting well.  He denies daytime sleep pressure or somnolence. Breathing is maintained on Breo and Incruse, he reports that these medications are expensive. He does report pedal edema, compliant with Lasix  He underwent penile implant surgery which was uneventful from a breathing standpoint  Significant tests/ events reviewed  CT chest 11/2018   Showed elevated Lt hemi-diaphragm  emphysema, mild bronchiectasis and peripheral subpleural reticulation within right lung base which appears slightlyprogressive.   PFTs2/09/2019- FVC 2.24 (51%), FEV1 1.90 (59%), ratio 85, DLCOunc 71  PFTs 11/26/2017 - FVC 2.11 (48%), FEV1 1.77 (54%), ratio 84, DLCOcor 63  04/26/2019-Home sleep study-AHI 38.5 an hour, SaO2 low 68%, severe O2 drops, average O2 89%  06/03/2019-CPAP titration-optimal Pap pressure could not be selected, recommendations: Trial of auto BiPAP 6 to 18 cm with medium full facemask  02/08/2019-PFT-FVC 2.11 (48% predicted), postbronchodilator ratio 85, postbronchodilator FEV1 1.90 (59% predicted), no bronchodilator response, mid flow reversibility, DLCO 71  Review of Systems neg for any significant sore throat, dysphagia, itching, sneezing, nasal congestion or excess/ purulent secretions, fever, chills,  sweats, unintended wt loss, pleuritic or exertional cp, hempoptysis, orthopnea pnd or change in chronic leg swelling. Also denies presyncope, palpitations, heartburn, abdominal pain, nausea, vomiting, diarrhea or change in bowel or urinary habits, dysuria,hematuria, rash, arthralgias, visual complaints, headache, numbness weakness or ataxia.     Objective:   Physical Exam  Gen. Pleasant, well-nourished, in no distress ENT - no thrush, no pallor/icterus,no post nasal drip Neck: No JVD, no thyromegaly, no carotid bruits Lungs: no use of accessory muscles, no dullness to percussion, RT basal rales no rhonchi  Cardiovascular: Rhythm regular, heart sounds  normal, no murmurs or gallops, 2+ peripheral edema Musculoskeletal: No deformities, no cyanosis or clubbing        Assessment & Plan:

## 2021-03-26 NOTE — Telephone Encounter (Signed)
Please advise. Thank you

## 2021-03-26 NOTE — Telephone Encounter (Signed)
Patient calling because he wants to make sure he can have his infusion Thursday. Patient had a surgery 03/20/21, and there is no sign of infection. Please call to advise.

## 2021-03-26 NOTE — Patient Instructions (Signed)
  Sample of Trelegy 1 puff daily, rinse mouth after use Call us back if this works and we will send in a prescription, this will take the place of Breo and Incruse  Prescription for auto BiPAP, IPAP maximum 18 cm, EPAP minimum 10 cm, pressure support 4

## 2021-03-26 NOTE — Assessment & Plan Note (Signed)
He has mild bronchiectasis of the left lower lobe on his imaging.  He does not have significant ILD related to RA. We discussed airway clearance measures should he get an exacerbation

## 2021-03-26 NOTE — Assessment & Plan Note (Signed)
His PFTs showed restriction rather than obstruction so I am not fully convinced about COPD but he has had subjective benefit with triple therapy.  So we will continue, to decrease his co-pay, we will switch him to Trelegy instead of Breo and Incruse.  We provided him with samples today and he will call us back for prescription of this works

## 2021-03-26 NOTE — Assessment & Plan Note (Signed)
CPAP download today shows good compliance but residual AHI of 21/hour mostly obstructive events, central events seem to be 3.8/hour.  I do not think this will be fixed by CPAP alone and he will need bilevel settings. We will start him on auto BiPAP with IPAP maximum of 18 cm with pressure support of 4 cm and EPAP minimum of 10 cm we will check a download after 1 month and confirm that residual events are corrected with the settings

## 2021-03-28 DIAGNOSIS — E6609 Other obesity due to excess calories: Secondary | ICD-10-CM | POA: Diagnosis not present

## 2021-03-28 DIAGNOSIS — J449 Chronic obstructive pulmonary disease, unspecified: Secondary | ICD-10-CM | POA: Diagnosis not present

## 2021-03-28 DIAGNOSIS — I50812 Chronic right heart failure: Secondary | ICD-10-CM | POA: Diagnosis not present

## 2021-03-28 DIAGNOSIS — K219 Gastro-esophageal reflux disease without esophagitis: Secondary | ICD-10-CM | POA: Diagnosis not present

## 2021-03-28 NOTE — Telephone Encounter (Signed)
Returned patient's call - advised that we need clearance from urologist/surgeon prior to him resuming Actemra. Staff message sent to surgeon, Dr. Matilde Sprang for clearance. Will f/u

## 2021-03-29 ENCOUNTER — Other Ambulatory Visit: Payer: Self-pay | Admitting: Rheumatology

## 2021-03-29 ENCOUNTER — Inpatient Hospital Stay (HOSPITAL_COMMUNITY): Admission: RE | Admit: 2021-03-29 | Payer: Medicare Other | Source: Ambulatory Visit

## 2021-04-02 ENCOUNTER — Other Ambulatory Visit: Payer: Self-pay | Admitting: Rheumatology

## 2021-04-02 ENCOUNTER — Other Ambulatory Visit: Payer: Self-pay | Admitting: Physician Assistant

## 2021-04-02 ENCOUNTER — Telehealth: Payer: Self-pay | Admitting: Pharmacist

## 2021-04-02 NOTE — Telephone Encounter (Signed)
Received notification that patient from Dr. Matilde Sprang that [name] is cleared to receive Actemra infusion. Patient advised and he will call [facility] today to r/s  Knox Saliva, PharmD, MPH Clinical Pharmacist (Rheumatology and Pulmonology)

## 2021-04-02 NOTE — Telephone Encounter (Signed)
-----   Message from Bjorn Loser, MD sent at 03/31/2021  5:09 PM EDT ----- Regarding: RE: Post-Op Clearance Yes Patient cannot have a foley catheter  Thanks   ----- Message ----- From: Cassandria Anger, [facility] Sent: 03/28/2021   8:31 AM EDT To: Bjorn Loser, MD Subject: Post-Op Clearance                              Hi Dr. Matilde Sprang,  [name] has an Actemra infusion scheduled tomorrow. It seems like his surgery went well and he states he doesn't have signs/symptoms of an infection,  but Dr. Estanislado Pandy would like clarification if he is cleared to resume his infusion post-op from your perspective?  Thanks!  Knox Saliva, PharmD, MPH Clinical Pharmacist (Rheumatology and Pulmonology)

## 2021-04-03 DIAGNOSIS — N3946 Mixed incontinence: Secondary | ICD-10-CM | POA: Diagnosis not present

## 2021-04-03 NOTE — Telephone Encounter (Signed)
Received staff message from Dr. Matilde Sprang that [name] is cleared to resume Actemra infusions.  Patient notified and will call [facility] to schedule infusion

## 2021-04-17 DIAGNOSIS — L0291 Cutaneous abscess, unspecified: Secondary | ICD-10-CM | POA: Diagnosis not present

## 2021-04-17 DIAGNOSIS — N3946 Mixed incontinence: Secondary | ICD-10-CM | POA: Diagnosis not present

## 2021-04-20 ENCOUNTER — Inpatient Hospital Stay (HOSPITAL_COMMUNITY): Admission: RE | Admit: 2021-04-20 | Payer: Medicare Other | Source: Ambulatory Visit

## 2021-04-26 ENCOUNTER — Ambulatory Visit (HOSPITAL_COMMUNITY): Payer: Medicare Other

## 2021-04-26 DIAGNOSIS — N3946 Mixed incontinence: Secondary | ICD-10-CM | POA: Diagnosis not present

## 2021-04-26 DIAGNOSIS — R351 Nocturia: Secondary | ICD-10-CM | POA: Diagnosis not present

## 2021-04-26 DIAGNOSIS — L0291 Cutaneous abscess, unspecified: Secondary | ICD-10-CM | POA: Diagnosis not present

## 2021-04-30 ENCOUNTER — Other Ambulatory Visit: Payer: Self-pay

## 2021-04-30 ENCOUNTER — Telehealth: Payer: Self-pay | Admitting: Internal Medicine

## 2021-04-30 MED ORDER — BREO ELLIPTA 100-25 MCG/INH IN AEPB: INHALATION_SPRAY | RESPIRATORY_TRACT | 1 refills | Status: DC

## 2021-04-30 NOTE — Progress Notes (Signed)
Erroneous encounter

## 2021-04-30 NOTE — Telephone Encounter (Signed)
Pt stated that he got a cold about the same time that he started using the trelegy.   He stated that he could not really see a change with using the trelegy so he stopped when it was empty and has gone back to the incruse and the breo.  He stated that he did need a refill of the breo and this has been sent to the mail order pharmacy.   Will route to RA to make him aware.

## 2021-05-03 DIAGNOSIS — N3946 Mixed incontinence: Secondary | ICD-10-CM | POA: Diagnosis not present

## 2021-05-17 DIAGNOSIS — R351 Nocturia: Secondary | ICD-10-CM | POA: Diagnosis not present

## 2021-05-17 DIAGNOSIS — N3946 Mixed incontinence: Secondary | ICD-10-CM | POA: Diagnosis not present

## 2021-05-22 ENCOUNTER — Telehealth: Payer: Self-pay

## 2021-05-22 NOTE — Telephone Encounter (Signed)
Patient called stating Dr. Matilde Sprang told him he was cleared to restart his infusion.  Patient requested a return call.

## 2021-05-22 NOTE — Telephone Encounter (Signed)
Patient states he cancelled his Actemra infusion on 04/20/21 d/t ulcer that had formed near his cyst. He states that he has been re-cleared by Dr. Matilde Sprang to resume Actemra infusion.  I'm unsure if there are Actemra infusion orders still in place at this point but I've left VM with [facility] to reach out to me directly if orders are needed  I've advised patient to call [facility] to schedule infusion since he is medically cleared to resume infusions  Knox Saliva, PharmD, MPH Clinical Pharmacist (Rheumatology and Pulmonology)

## 2021-06-04 ENCOUNTER — Other Ambulatory Visit: Payer: Self-pay

## 2021-06-04 ENCOUNTER — Ambulatory Visit (HOSPITAL_COMMUNITY)
Admission: RE | Admit: 2021-06-04 | Discharge: 2021-06-04 | Disposition: A | Discharge status: Home / Self Care | Payer: Medicare Other | Source: Ambulatory Visit | Attending: Rheumatology | Admitting: Rheumatology

## 2021-06-04 DIAGNOSIS — M0579 Rheumatoid arthritis with rheumatoid factor of multiple sites without organ or systems involvement: Secondary | ICD-10-CM

## 2021-06-04 DIAGNOSIS — Z79899 Other long term (current) drug therapy: Secondary | ICD-10-CM

## 2021-06-04 LAB — COMPREHENSIVE METABOLIC PANEL
ALT: 23 U/L (ref 0–44)
AST: 25 U/L (ref 15–41)
Albumin: 3.4 g/dL — ABNORMAL LOW (ref 3.5–5.0)
Alkaline Phosphatase: 100 U/L (ref 38–126)
Anion gap: 9 (ref 5–15)
BUN: 14 mg/dL (ref 8–23)
CO2: 30 mmol/L (ref 22–32)
Calcium: 8.9 mg/dL (ref 8.9–10.3)
Chloride: 100 mmol/L (ref 98–111)
Creatinine, Ser: 1.18 mg/dL (ref 0.61–1.24)
GFR, Estimated: >60 mL/min (ref >60)
Glucose, Bld: 133 mg/dL — ABNORMAL HIGH (ref 70–99)
Potassium: 4 mmol/L (ref 3.5–5.1)
Sodium: 139 mmol/L (ref 135–145)
Total Bilirubin: 0.6 mg/dL (ref 0.3–1.2)
Total Protein: 6.6 g/dL (ref 6.5–8.1)

## 2021-06-04 LAB — CBC WITH DIFFERENTIAL/PLATELET
Abs Immature Granulocytes: 0.06 10*3/uL (ref 0.00–0.07)
Basophils Absolute: 0.1 10*3/uL (ref 0.0–0.1)
Basophils Relative: 1 %
Eosinophils Absolute: 0.6 10*3/uL — ABNORMAL HIGH (ref 0.0–0.5)
Eosinophils Relative: 6 %
HCT: 45.9 % (ref 39.0–52.0)
Hemoglobin: 14.5 g/dL (ref 13.0–17.0)
Immature Granulocytes: 1 %
Lymphocytes Relative: 12 %
Lymphs Abs: 1.1 10*3/uL (ref 0.7–4.0)
MCH: 29.3 pg (ref 26.0–34.0)
MCHC: 31.6 g/dL (ref 30.0–36.0)
MCV: 92.7 fL (ref 80.0–100.0)
Monocytes Absolute: 1.3 10*3/uL — ABNORMAL HIGH (ref 0.1–1.0)
Monocytes Relative: 13 %
Neutro Abs: 6.4 10*3/uL (ref 1.7–7.7)
Neutrophils Relative %: 67 %
Platelets: 230 10*3/uL (ref 150–400)
RBC: 4.95 MIL/uL (ref 4.22–5.81)
RDW: 14.4 % (ref 11.5–15.5)
WBC: 9.5 10*3/uL (ref 4.0–10.5)
nRBC: 0 % (ref 0.0–0.2)

## 2021-06-04 MED ORDER — DIPHENHYDRAMINE HCL 25 MG PO CAPS: 25.0000 mg | ORAL_CAPSULE | ORAL | Status: DC

## 2021-06-04 MED ORDER — SODIUM CHLORIDE 0.9 % IV SOLN
4.0000 mg/kg | INTRAVENOUS | Status: DC
  Administered 2021-06-04: 400 mg via INTRAVENOUS
  Filled 2021-06-04: qty 20

## 2021-06-04 MED ORDER — ACETAMINOPHEN 325 MG PO TABS
650.0000 mg | ORAL_TABLET | ORAL | Status: DC
Start: 2021-06-04 — End: 2021-06-05

## 2021-06-04 NOTE — Progress Notes (Signed)
Glucose is elevated.  CBC is normal.

## 2021-06-07 NOTE — Progress Notes (Signed)
Office Visit Note  Patient: [name]             Date of Birth: 1951/10/17           MRN: [id number]             PCP: [name], MD Referring: [name], MD Visit Date: 06/21/2021 Occupation: @GUAROCC @  Subjective:  Medication management   History of Present Illness: [name] is a 70 y.o. male with a history of rheumatoid arthritis, osteoarthritis and gout.  He states he has been doing well on current combination of medications.  He is getting Actemra infusions at the hospital and he is also taking leflunomide 20 mg p.o. daily.  He denies any joint swelling today.  He has not had a gout flare in a long time.  He is on allopurinol 200 mg a day.  Activities of Daily Living:  Patient reports morning stiffness for 0  none .   Patient Reports nocturnal pain.  Difficulty dressing/grooming: Denies Difficulty climbing stairs: Reports Difficulty getting out of chair: Denies Difficulty using hands for taps, buttons, cutlery, and/or writing: Denies  Review of Systems  Constitutional:  Positive for fatigue.  HENT:  Negative for mouth dryness.   Eyes:  Negative for dryness.  Respiratory:  Positive for shortness of breath.   Cardiovascular:  Positive for swelling in legs/feet.  Gastrointestinal:  Positive for constipation.  Endocrine: Positive for cold intolerance, excessive thirst and increased urination.  Genitourinary:  Negative for decreased urine output.  Musculoskeletal:  Negative for joint pain, joint pain, joint swelling and morning stiffness.  Skin:  Negative for rash.  Allergic/Immunologic: Negative for susceptible to infections.  Neurological:  Negative for numbness.  Hematological:  Positive for bruising/bleeding tendency.  Psychiatric/Behavioral:  Negative for sleep disturbance.    PMFS History:  Patient Active Problem List   Diagnosis Date Noted   Bronchiectasis without complication ([place]) 38/09/1750   Incontinence when straining, male 03/20/2021    Erectile dysfunction after radical prostatectomy 09/08/2020   OAB (overactive bladder) 02/23/2020   Class 2 severe obesity due to excess calories with serious comorbidity and body mass index (BMI) of 35.0 to 35.9 in adult [facility])    Chronic diastolic HF (heart failure) ([place])    Serratia sepsis ([place]) 06/08/2019   Bacteremia due to Gram-negative bacteria 06/08/2019   Voice hoarseness 04/26/2019   OSA (obstructive sleep apnea) 03/10/2019   Abnormal CT of the chest 02/09/2019   Dyspnea on exertion 10/22/2017   History of seizure disorder 02/27/2017   Primary osteoarthritis of both hands 02/27/2017   Primary osteoarthritis of both feet 02/27/2017   Idiopathic gout of multiple sites 02/27/2017   High risk medication use 12/29/2016   History of prostate cancer 12/29/2016   Primary osteoarthritis of both knees 12/29/2016   Chronic idiopathic gout involving toe without tophus 12/29/2016   History of CHF (congestive heart failure) 12/29/2016   History of COPD 12/29/2016   Plantar pustular psoriasis 12/29/2016   DVT (deep venous thrombosis) ([place]) 10/31/2016   COPD with acute exacerbation ([place]) 10/31/2016   CHF (congestive heart failure) ([place]) 10/31/2016   Prostate cancer ([place]) 08/30/2015   Malignant neoplasm of prostate ([place]) 07/04/2015   Chest pain at rest 07/05/2014   Weakness 02/58/5277   Complicated postphlebitic syndrome 11/16/2013   Personal history of DVT (deep vein thrombosis) 05/18/2013   Chronic anticoagulation 05/18/2013   Diverticulosis of colon without hemorrhage 05/18/2013   Rheumatoid arthritis ([place]) 05/18/2013   Seizure disorder ([place])  05/18/2013   Hx of adenomatous colonic polyps 05/18/2013   GERD 05/08/2010   NAUSEA 05/08/2010   FLATULENCE-GAS-BLOATING 05/08/2010    Past Medical History:  Diagnosis Date   Arthritis    RA   Asthma    CHF (congestive heart failure) (HCC)    pt denies    Clotting disorder (HCC)    DVT both legs    COPD (chronic obstructive  pulmonary disease) (HCC)    DDD (degenerative disc disease), cervical    with UE's paresthesias   Diverticulosis    DVT, lower extremity ([place])    bilat   Eye abnormality    right eye drifts has difficulty focusing with right eye has had since birth    GERD (gastroesophageal reflux disease)    Gout    History of measles    History of shingles    Hypercholesterolemia    IBS (irritable bowel syndrome)    IBS (irritable bowel syndrome)    Peripheral edema    Pneumonia    hx of    Prostate cancer ([place])    Rheumatoid arthritis ([place])    Seizures ([place])    last seizure 20 years ago; on dilantin. Unknown origin.   Shortness of breath dyspnea    exertion    Sleep apnea    cpap    Tubular adenoma of colon 04/2008    Family History  Problem Relation Age of Onset   Skin cancer Father    Stomach cancer Father    Heart attack Father    Alzheimer's disease Mother    Throat cancer Brother    Stomach cancer Brother    Lung cancer Brother    Heart attack Brother    Heart attack Brother    Breast cancer Sister    Heart attack Sister    Stroke Sister    Bladder Cancer Sister    Heart murmur Sister    Colon cancer Brother    Colon polyps Neg Hx    Past Surgical History:  Procedure Laterality Date   CHOLECYSTECTOMY     CIRCUMCISION     COLONOSCOPY     CYSTOSCOPY WITH LITHOLAPAXY N/A 03/17/2019   Procedure: CYSTOSCOPY WITH LITHOLAPAXY AND REMOVAL OF FOREIGN BODY;  Surgeon: [name], MD;  Location: [facility];  Service: Urology;  Laterality: N/A;   DOPPLER ECHOCARDIOGRAPHY N/A 01-21-2012   TECHNICALLY DIFFICULT. MILD CONCENTRIC LV HYPERTROPHY. LV CAVITY IS SMALL.. EF=> 55%. TRANSMITRAL SPECTRAL FLOW PATTREN IS SUGGESTIVE OF IMPAIRED LV RELAXATION. RV SYSTOLIC PRESSURE IS 24PYKD. LEFT ATRIAL SIZE IS NORMAL. AV APPEARS MILDLY SCLEROTIC. NO SIGN VALVE DISEASE NOTED.   LYMPHADENECTOMY Bilateral 08/30/2015   Procedure: PELVIC LYMPHADENECTOMY;  Surgeon: [name], MD;  Location:  [facility];  Service: Urology;  Laterality: Bilateral;   NUCLEAR STRESS TEST N/A 01-21-2012   NORMAL PATTERN OF PERFUSION IN ALL REGIONS. POST STRESS LV SIZE IS NORMAL. NO EVIDENCE OF INDUCIBLE ISCHEMIA. EF 54%.   POLYPECTOMY     PROSTATE BIOPSY     ROBOT ASSISTED LAPAROSCOPIC RADICAL PROSTATECTOMY N/A 08/30/2015   Procedure: ROBOTIC ASSISTED LAPAROSCOPIC RADICAL PROSTATECTOMY;  Surgeon: [name], MD;  Location: [facility];  Service: Urology;  Laterality: N/A;   URINARY SPHINCTER IMPLANT N/A 03/20/2021   Procedure: ARTIFICIAL URINARY SPHINCTER CYSTOSCOPY;  Surgeon: Bjorn Loser, MD;  Location: [facility];  Service: Urology;  Laterality: N/A;  REQUESTING 90 MINS   US VENOUS LOWER EXT Right 03/07/11   PERSISTENT DVT IN RIGHT LOWER EXT.WITH PERSISTANT VISUALIZATION OF  HYPOECHOIC THROMBUS WITHIN THE FEMORAL, PROFUNDA FEMORAL AND POPLITEAL VEINS. WHEN COMPARED TO PREVIOUS, CLOT IS NO LONGER IDENTIFIED WITH IN THE RIGHT CFV.   Social History   Social History Narrative   Not on file   Immunization History  Administered Date(s) Administered   Fluad Quad(high Dose 65+) 09/30/2019   Influenza, High Dose Seasonal PF 10/05/2018   Influenza,inj,Quad PF,6+ Mos 08/31/2015   Influenza-Unspecified 09/30/2016, 08/22/2017   Moderna Sars-Covid-2 Vaccination 01/21/2020, 02/18/2020, 10/25/2020   Pneumococcal Conjugate-13 10/05/2018   Pneumococcal-Unspecified 12/30/2012     Objective: Vital Signs: BP 125/64 (BP Location: Left Arm, Patient Position: Sitting, Cuff Size: Normal)   Pulse 68   Resp 17   Ht 5\' 9"  (1.753 m)   Wt 233 lb (105.7 kg)   BMI 34.41 kg/m    Physical Exam Vitals and nursing note reviewed.  Constitutional:      Appearance: He is well-developed.  HENT:     Head: Normocephalic and atraumatic.  Eyes:     Conjunctiva/sclera: Conjunctivae normal.     Pupils: Pupils are equal, round, and reactive to light.  Cardiovascular:     Rate and Rhythm: Normal rate and regular rhythm.      Heart sounds: Normal heart sounds.  Pulmonary:     Effort: Pulmonary effort is normal.     Breath sounds: Normal breath sounds.  Abdominal:     General: Bowel sounds are normal.     Palpations: Abdomen is soft.  Musculoskeletal:     Cervical back: Normal range of motion and neck supple.  Skin:    General: Skin is warm and dry.     Capillary Refill: Capillary refill takes less than 2 seconds.  Neurological:     Mental Status: He is alert and oriented to person, place, and time.  Psychiatric:        Behavior: Behavior normal.     Musculoskeletal Exam: C-spine was in good range of motion.  Shoulder joints, elbow joints, wrist joints with good range of motion.  He had no synovitis over MCPs or PIPs.  PIP and DIP thickening was noted.  Hip joints and knee joints with good range of motion.  No warmth swelling or effusion was noted.  There was no tenderness over ankles or MTPs.  CDAI Exam: CDAI Score: 0.2  Patient Global: 1 mm; Provider Global: 1 mm Swollen: 0 ; Tender: 0  Joint Exam 06/21/2021   No joint exam has been documented for this visit   There is currently no information documented on the homunculus. Go to the Rheumatology activity and complete the homunculus joint exam.  Investigation: No additional findings.  Imaging: No results found.  Recent Labs: Lab Results  Component Value Date   WBC 9.5 06/04/2021   HGB 14.5 06/04/2021   PLT 230 06/04/2021   NA 139 06/04/2021   K 4.0 06/04/2021   CL 100 06/04/2021   CO2 30 06/04/2021   GLUCOSE 133 (H) 06/04/2021   BUN 14 06/04/2021   CREATININE 1.18 06/04/2021   BILITOT 0.6 06/04/2021   ALKPHOS 100 06/04/2021   AST 25 06/04/2021   ALT 23 06/04/2021   PROT 6.6 06/04/2021   ALBUMIN 3.4 (L) 06/04/2021   CALCIUM 8.9 06/04/2021   GFRAA >60 09/20/2020   QFTBGOLDPLUS Negative 02/01/2021    Speciality Comments: ACTEMRA 4mg /kg x 4 weeks PPD negative 01/02/17  Procedures:  No procedures performed Allergies: Orencia  [abatacept], Carbamazepine, Celecoxib, Cephalexin, Enbrel [etanercept], Humira [adalimumab], Levofloxacin, Sulfa antibiotics, and Sulfasalazine   Assessment /  Plan:     Visit Diagnoses: Rheumatoid arthritis involving multiple sites with positive rheumatoid factor (HCC)-he is doing well on Actemra and leflunomide combination.  He had no synovitis on examination.  He denies any discomfort or swelling in his joints today.  High risk medication use - Actemra 4mg /kg every 4 weeks (last infusion was on 01/04/21) and Arava 20 mg 1 tablet by mouth daily.  Labs obtained on June 04, 2021 were within normal limits.  He will be getting labs with his infusions.  TB gold was negative on February 01, 2021.  He has been advised to get COVID-19 booster.  Recommendations regarding realization were also placed in the AVS.  He has been advised to stop Actemra and Arava if he develops an infection and resume medications once infection resolves.  Chronic idiopathic gout involving toe without tophus, unspecified laterality - His uric acid was 4.6 on 01/04/21. He takes allopurinol 100 mg BID for management of gout.  He has not had a gout flare in a long time.  I will check uric acid with the next labs.  Primary osteoarthritis of both hands-he has osteoarthritis in his hands.  Joint protection muscle strengthening was discussed.  Primary osteoarthritis of both knees-he has osteoarthritis in his knee joints.  He denies any discomfort currently.  Primary osteoarthritis of both feet-he had no tenderness or synovitis in his feet.  He does have some pedal edema.  Age-related osteoporosis without current pathological fracture - DEXA on 09/28/2019 T-score -2.1 right femur neck with +12 % change in BMD. Fosamax 70 mg 1 tablet by mouth once weekly (started in October 2018).  He has been on Fosamax since 2018.  I plan to get a DEXA scan in October.  If his DEXA scan is stable or improving then I would give him a drug holiday from Fosamax.-  Plan: DG BONE DENSITY (DXA)  History of COPD-he has some shortness of breath on exertion.  History of seizure disorder  History of CHF (congestive heart failure)-increased risk of heart disease with rheumatoid arthritis was discussed.  Dietary modifications and exercise was emphasized.  History of DVT (deep vein thrombosis)  History of prostate cancer  History of adenomatous polyp of colon  Orders: Orders Placed This Encounter  Procedures   DG BONE DENSITY (DXA)    No orders of the defined types were placed in this encounter.    Follow-Up Instructions: Return in about 5 months (around 11/21/2021) for Rheumatoid arthritis, Osteoarthritis, Gout.   Bo Merino, MD  Note - This record has been created using Editor, commissioning.  Chart creation errors have been sought, but may not always  have been located. Such creation errors do not reflect on  the standard of medical care.

## 2021-06-08 ENCOUNTER — Other Ambulatory Visit: Payer: Self-pay | Admitting: Rheumatology

## 2021-06-08 ENCOUNTER — Other Ambulatory Visit: Payer: Self-pay | Admitting: Physician Assistant

## 2021-06-08 NOTE — Telephone Encounter (Signed)
Last Visit: 01/18/2021 Next Visit: 06/21/2021 Labs: 06/04/2021  Labs: 01/04/2021, CBC is normal, GFR is low and stable   Current Dose per office note 01/18/2021 arava 20 mg 1 tablet by mouth daily Dx: Rheumatoid arthritis involving multiple sites with positive rheumatoid factor  Okay to refill Arava?

## 2021-06-08 NOTE — Telephone Encounter (Addendum)
Last Visit: 01/18/2021 Next Visit: 06/21/2021 Labs: 06/04/2021    Current Dose per office note 01/18/2021, Fosamax 70 mg 1 tablet by mouth once weekly, allopurinol 100 mg BID  DX: Age-related osteoporosis without current pathological fracture, Chronic idiopathic gout involving toe without tophus, unspecified laterality Last Fill: 02/06/2021   Okay to refill Fosamax and Allopurinol and Folic Acid ?

## 2021-06-21 ENCOUNTER — Other Ambulatory Visit: Payer: Self-pay

## 2021-06-21 ENCOUNTER — Ambulatory Visit (INDEPENDENT_AMBULATORY_CARE_PROVIDER_SITE_OTHER): Payer: Medicare Other | Admitting: Rheumatology

## 2021-06-21 ENCOUNTER — Encounter: Payer: Self-pay | Admitting: Rheumatology

## 2021-06-21 VITALS — BP 125/64 | HR 68 | Resp 17 | Ht 69.0 in | Wt 233.0 lb

## 2021-06-21 DIAGNOSIS — M19072 Primary osteoarthritis, left ankle and foot: Secondary | ICD-10-CM

## 2021-06-21 DIAGNOSIS — Z8546 Personal history of malignant neoplasm of prostate: Secondary | ICD-10-CM | POA: Diagnosis not present

## 2021-06-21 DIAGNOSIS — M170 Bilateral primary osteoarthritis of knee: Secondary | ICD-10-CM

## 2021-06-21 DIAGNOSIS — Z86718 Personal history of other venous thrombosis and embolism: Secondary | ICD-10-CM | POA: Diagnosis not present

## 2021-06-21 DIAGNOSIS — M810 Age-related osteoporosis without current pathological fracture: Secondary | ICD-10-CM | POA: Diagnosis not present

## 2021-06-21 DIAGNOSIS — Z8679 Personal history of other diseases of the circulatory system: Secondary | ICD-10-CM

## 2021-06-21 DIAGNOSIS — M19041 Primary osteoarthritis, right hand: Secondary | ICD-10-CM

## 2021-06-21 DIAGNOSIS — Z79899 Other long term (current) drug therapy: Secondary | ICD-10-CM

## 2021-06-21 DIAGNOSIS — Z8709 Personal history of other diseases of the respiratory system: Secondary | ICD-10-CM | POA: Diagnosis not present

## 2021-06-21 DIAGNOSIS — M1A0790 Idiopathic chronic gout, unspecified ankle and foot, without tophus (tophi): Secondary | ICD-10-CM

## 2021-06-21 DIAGNOSIS — M19042 Primary osteoarthritis, left hand: Secondary | ICD-10-CM

## 2021-06-21 DIAGNOSIS — M0579 Rheumatoid arthritis with rheumatoid factor of multiple sites without organ or systems involvement: Secondary | ICD-10-CM

## 2021-06-21 DIAGNOSIS — Z8669 Personal history of other diseases of the nervous system and sense organs: Secondary | ICD-10-CM | POA: Diagnosis not present

## 2021-06-21 DIAGNOSIS — M19071 Primary osteoarthritis, right ankle and foot: Secondary | ICD-10-CM | POA: Diagnosis not present

## 2021-06-21 DIAGNOSIS — Z86010 Personal history of colonic polyps: Secondary | ICD-10-CM

## 2021-06-21 NOTE — Progress Notes (Signed)
Uric acid lab added to [facility] orders per request of Dr. Estanislado Pandy.  Next Actemra infusion is scheduled for 07/03/21.  Knox Saliva, PharmD, MPH Clinical Pharmacist (Rheumatology and Pulmonology)

## 2021-06-21 NOTE — Addendum Note (Signed)
Addended by: Cassandria Anger on: 06/21/2021 11:01 AM   Modules accepted: Orders

## 2021-06-21 NOTE — Patient Instructions (Signed)
Vaccines You are taking a medication(s) that can suppress your immune system.  The following immunizations are recommended: Flu annually Covid-19  Td/Tdap (tetanus, diphtheria, pertussis) every 10 years Pneumonia (Prevnar 15 then Pneumovax 23 at least 1 year apart.  Alternatively, can take Prevnar 20 without needing additional dose) Shingrix (after age 71): 2 doses from 4 weeks to 6 months apart  Please check with your PCP to make sure you are up to date.   If you test POSITIVE for COVID19 and have MILD to MODERATE symptoms: First, call your PCP if you would like to receive COVID19 treatment AND Hold your medications during the infection and for at least 1 week after your symptoms have resolved: Injectable medication (Benlysta, Cimzia, Cosentyx, Enbrel, Humira, Orencia, Remicade, Simponi, Stelara, Taltz, Tremfya) Methotrexate Leflunomide (Arava) Mycophenolate (Cellcept) Morrie Sheldon, Olumiant, or Rinvoq If you take Actemra or Kevzara, you DO NOT need to hold these for COVID19 infection.  If you test POSITIVE for COVID19 and have NO symptoms: First, call your PCP if you would like to receive COVID19 treatment AND Hold your medications for at least 10 days after the day that you tested positive Injectable medication (Benlysta, Cimzia, Cosentyx, Enbrel, Humira, Orencia, Remicade, Simponi, Stelara, Taltz, Tremfya) Methotrexate Leflunomide (Arava) Mycophenolate (Cellcept) Morrie Sheldon, Olumiant, or Rinvoq If you take Actemra or Kevzara, you DO NOT need to hold these for COVID19 infection.  If you have signs or symptoms of an infection or start antibiotics: First, call your PCP for workup of your infection. Hold your medication through the infection, until you complete your antibiotics, and until symptoms resolve if you take the following: Injectable medication (Actemra, Benlysta, Cimzia, Cosentyx, Enbrel, Humira, Kevzara, Orencia, Remicade, Simponi, Stelara, Taltz,  Tremfya) Methotrexate Leflunomide (Arava) Mycophenolate (Cellcept) Morrie Sheldon, Olumiant, or Rinvoq  Heart Disease Prevention   Your inflammatory disease increases your risk of heart disease which includes heart attack, stroke, atrial fibrillation (irregular heartbeats), high blood pressure, heart failure and atherosclerosis (plaque in the arteries).  It is important to reduce your risk by:   Keep blood pressure, cholesterol, and blood sugar at healthy levels   Smoking Cessation   Maintain a healthy weight  BMI 20-25   Eat a healthy diet  Plenty of fresh fruit, vegetables, and whole grains  Limit saturated fats, foods high in sodium, and added sugars  DASH and Mediterranean diet   Increase physical activity  Recommend moderate physically activity for 150 minutes per week/ 30 minutes a day for five days a week These can be broken up into three separate ten-minute sessions during the day.   Reduce Stress  Meditation, slow breathing exercises, yoga, coloring books  Dental visits twice a year

## 2021-06-27 DIAGNOSIS — N3946 Mixed incontinence: Secondary | ICD-10-CM | POA: Diagnosis not present

## 2021-06-28 DIAGNOSIS — I50812 Chronic right heart failure: Secondary | ICD-10-CM | POA: Diagnosis not present

## 2021-06-28 DIAGNOSIS — K219 Gastro-esophageal reflux disease without esophagitis: Secondary | ICD-10-CM | POA: Diagnosis not present

## 2021-06-28 DIAGNOSIS — J449 Chronic obstructive pulmonary disease, unspecified: Secondary | ICD-10-CM | POA: Diagnosis not present

## 2021-06-29 ENCOUNTER — Other Ambulatory Visit: Payer: Self-pay | Admitting: Pharmacist

## 2021-06-29 DIAGNOSIS — M1A0790 Idiopathic chronic gout, unspecified ankle and foot, without tophus (tophi): Secondary | ICD-10-CM

## 2021-06-29 DIAGNOSIS — Z79899 Other long term (current) drug therapy: Secondary | ICD-10-CM

## 2021-06-29 DIAGNOSIS — M0579 Rheumatoid arthritis with rheumatoid factor of multiple sites without organ or systems involvement: Secondary | ICD-10-CM

## 2021-06-29 NOTE — Progress Notes (Signed)
Next infusion scheduled for Actemra IV on 07/03/21 and due for updated orders. Diagnosis:  Dose: Actemra 4mg /kg every 28 days based on last recorded weight of 105.7kg  Last Clinic Visit: 06/21/21 Next Clinic Visit: 11/26/21  Last infusion: 06/04/21  Labs: CBC and CMP on 06/04/21 - CBC normal, glucose is elevated but rest of CMP wnl TB Gold: negative on 02/01/21   Orders placed for Actemra 4mg /kg x 3 doses along with premedication of Tylenol and Benadryl to be administered 30 minutes before medication infusion.  Standing CBC with diff/platelet and CMP with GFR orders placed to be drawn every 2 months.  Next TB gold due Feb 2022. Will also draw uric acid once with upcoming infusion on 07/03/21  Knox Saliva, PharmD, MPH Clinical Pharmacist (Rheumatology and Pulmonology)

## 2021-07-03 ENCOUNTER — Other Ambulatory Visit: Payer: Self-pay

## 2021-07-03 ENCOUNTER — Ambulatory Visit (HOSPITAL_COMMUNITY)
Admission: RE | Admit: 2021-07-03 | Discharge: 2021-07-03 | Disposition: A | Discharge status: Home / Self Care | Payer: Medicare Other | Source: Ambulatory Visit | Attending: Rheumatology | Admitting: Rheumatology

## 2021-07-03 DIAGNOSIS — M1A0790 Idiopathic chronic gout, unspecified ankle and foot, without tophus (tophi): Secondary | ICD-10-CM | POA: Diagnosis not present

## 2021-07-03 DIAGNOSIS — Z79899 Other long term (current) drug therapy: Secondary | ICD-10-CM | POA: Diagnosis not present

## 2021-07-03 DIAGNOSIS — M0579 Rheumatoid arthritis with rheumatoid factor of multiple sites without organ or systems involvement: Secondary | ICD-10-CM | POA: Insufficient documentation

## 2021-07-03 LAB — COMPREHENSIVE METABOLIC PANEL
ALT: 29 U/L (ref 0–44)
AST: 39 U/L (ref 15–41)
Albumin: 3.9 g/dL (ref 3.5–5.0)
Alkaline Phosphatase: 65 U/L (ref 38–126)
Anion gap: 5 (ref 5–15)
BUN: 12 mg/dL (ref 8–23)
CO2: 31 mmol/L (ref 22–32)
Calcium: 9.2 mg/dL (ref 8.9–10.3)
Chloride: 100 mmol/L (ref 98–111)
Creatinine, Ser: 1.22 mg/dL (ref 0.61–1.24)
GFR, Estimated: >60 mL/min (ref >60)
Glucose, Bld: 102 mg/dL — ABNORMAL HIGH (ref 70–99)
Potassium: 5.1 mmol/L (ref 3.5–5.1)
Sodium: 136 mmol/L (ref 135–145)
Total Bilirubin: 0.8 mg/dL (ref 0.3–1.2)
Total Protein: 6 g/dL — ABNORMAL LOW (ref 6.5–8.1)

## 2021-07-03 LAB — URIC ACID: Uric Acid, Serum: 5.7 mg/dL (ref 3.7–8.6)

## 2021-07-03 MED ORDER — TOCILIZUMAB 400 MG/20ML IV SOLN
4.0000 mg/kg | INTRAVENOUS | Status: DC
  Administered 2021-07-03: 422 mg via INTRAVENOUS
  Filled 2021-07-03: qty 4

## 2021-07-03 MED ORDER — DIPHENHYDRAMINE HCL 25 MG PO CAPS
25.0000 mg | ORAL_CAPSULE | ORAL | Status: DC
Start: 2021-07-03 — End: 2021-07-04

## 2021-07-03 MED ORDER — ACETAMINOPHEN 325 MG PO TABS: 650.0000 mg | ORAL_TABLET | ORAL | Status: DC

## 2021-07-03 NOTE — Progress Notes (Signed)
Total protein is borderline low-6.0.  glucose is 102.  Rest of CMP WNL.  Uric acid is within the desirable range.

## 2021-07-04 ENCOUNTER — Other Ambulatory Visit: Payer: Self-pay | Admitting: Cardiovascular Disease

## 2021-07-09 ENCOUNTER — Other Ambulatory Visit: Payer: Self-pay | Admitting: Rheumatology

## 2021-07-09 ENCOUNTER — Other Ambulatory Visit: Payer: Self-pay | Admitting: Physician Assistant

## 2021-07-28 ENCOUNTER — Encounter: Payer: Self-pay | Admitting: Gastroenterology

## 2021-07-29 DIAGNOSIS — K219 Gastro-esophageal reflux disease without esophagitis: Secondary | ICD-10-CM | POA: Diagnosis not present

## 2021-07-29 DIAGNOSIS — J449 Chronic obstructive pulmonary disease, unspecified: Secondary | ICD-10-CM | POA: Diagnosis not present

## 2021-07-29 DIAGNOSIS — I50812 Chronic right heart failure: Secondary | ICD-10-CM | POA: Diagnosis not present

## 2021-07-31 ENCOUNTER — Encounter (HOSPITAL_COMMUNITY)
Admission: RE | Admit: 2021-07-31 | Discharge: 2021-07-31 | Disposition: A | Discharge status: Home / Self Care | Payer: Medicare Other | Source: Ambulatory Visit | Attending: Rheumatology | Admitting: Rheumatology

## 2021-07-31 ENCOUNTER — Other Ambulatory Visit: Payer: Self-pay

## 2021-07-31 DIAGNOSIS — Z79899 Other long term (current) drug therapy: Secondary | ICD-10-CM | POA: Insufficient documentation

## 2021-07-31 DIAGNOSIS — M0579 Rheumatoid arthritis with rheumatoid factor of multiple sites without organ or systems involvement: Secondary | ICD-10-CM | POA: Diagnosis not present

## 2021-07-31 LAB — CBC WITH DIFFERENTIAL/PLATELET
Abs Immature Granulocytes: 0.01 10*3/uL (ref 0.00–0.07)
Basophils Absolute: 0.1 10*3/uL (ref 0.0–0.1)
Basophils Relative: 1 %
Eosinophils Absolute: 0.6 10*3/uL — ABNORMAL HIGH (ref 0.0–0.5)
Eosinophils Relative: 12 %
HCT: 45.3 % (ref 39.0–52.0)
Hemoglobin: 14.5 g/dL (ref 13.0–17.0)
Immature Granulocytes: 0 %
Lymphocytes Relative: 22 %
Lymphs Abs: 1.1 10*3/uL (ref 0.7–4.0)
MCH: 29.2 pg (ref 26.0–34.0)
MCHC: 32 g/dL (ref 30.0–36.0)
MCV: 91.1 fL (ref 80.0–100.0)
Monocytes Absolute: 0.7 10*3/uL (ref 0.1–1.0)
Monocytes Relative: 14 %
Neutro Abs: 2.6 10*3/uL (ref 1.7–7.7)
Neutrophils Relative %: 51 %
Platelets: 160 10*3/uL (ref 150–400)
RBC: 4.97 MIL/uL (ref 4.22–5.81)
RDW: 14.6 % (ref 11.5–15.5)
WBC: 5 10*3/uL (ref 4.0–10.5)
nRBC: 0 % (ref 0.0–0.2)

## 2021-07-31 LAB — COMPREHENSIVE METABOLIC PANEL
ALT: 29 U/L (ref 0–44)
AST: 33 U/L (ref 15–41)
Albumin: 3.9 g/dL (ref 3.5–5.0)
Alkaline Phosphatase: 56 U/L (ref 38–126)
Anion gap: 9 (ref 5–15)
BUN: 15 mg/dL (ref 8–23)
CO2: 29 mmol/L (ref 22–32)
Calcium: 9.5 mg/dL (ref 8.9–10.3)
Chloride: 100 mmol/L (ref 98–111)
Creatinine, Ser: 1.46 mg/dL — ABNORMAL HIGH (ref 0.61–1.24)
GFR, Estimated: 51 mL/min — ABNORMAL LOW (ref >60)
Glucose, Bld: 120 mg/dL — ABNORMAL HIGH (ref 70–99)
Potassium: 4.1 mmol/L (ref 3.5–5.1)
Sodium: 138 mmol/L (ref 135–145)
Total Bilirubin: 0.6 mg/dL (ref 0.3–1.2)
Total Protein: 6.4 g/dL — ABNORMAL LOW (ref 6.5–8.1)

## 2021-07-31 MED ORDER — TOCILIZUMAB 400 MG/20ML IV SOLN
4.0000 mg/kg | INTRAVENOUS | Status: DC
  Administered 2021-07-31: 422 mg via INTRAVENOUS
  Filled 2021-07-31: qty 4

## 2021-07-31 MED ORDER — DIPHENHYDRAMINE HCL 25 MG PO CAPS: 25.0000 mg | ORAL_CAPSULE | ORAL | Status: DC

## 2021-07-31 MED ORDER — ACETAMINOPHEN 325 MG PO TABS: 650.0000 mg | ORAL_TABLET | ORAL | Status: DC

## 2021-07-31 NOTE — Progress Notes (Signed)
Glucose is elevated, creatinine is elevated.  Most likely due to the use of diuretics.  Please forward labs to his PCP.  CBC is normal.

## 2021-08-18 ENCOUNTER — Other Ambulatory Visit: Payer: Self-pay | Admitting: Pulmonary Disease

## 2021-08-27 ENCOUNTER — Other Ambulatory Visit (HOSPITAL_COMMUNITY): Payer: Self-pay | Admitting: *Deleted

## 2021-08-28 ENCOUNTER — Inpatient Hospital Stay (HOSPITAL_COMMUNITY): Admission: RE | Admit: 2021-08-28 | Payer: Medicare Other | Source: Ambulatory Visit

## 2021-08-28 DIAGNOSIS — Z20828 Contact with and (suspected) exposure to other viral communicable diseases: Secondary | ICD-10-CM | POA: Diagnosis not present

## 2021-08-28 DIAGNOSIS — U071 COVID-19: Secondary | ICD-10-CM | POA: Diagnosis not present

## 2021-08-29 ENCOUNTER — Telehealth: Payer: Self-pay | Admitting: Internal Medicine

## 2021-08-29 MED ORDER — FLUTICASONE FUROATE-VILANTEROL 100-25 MCG/INH IN AEPB: INHALATION_SPRAY | RESPIRATORY_TRACT | 0 refills | Status: DC

## 2021-08-29 NOTE — Telephone Encounter (Signed)
Called and spoke with patient. He state that he needed a refill on his Breo sent to [facility] in [place]. He requested a 90 day supply. I advised him that I would go ahead and send this in for him. He verbalized understanding.   Nothing further needed at time of call.

## 2021-09-18 ENCOUNTER — Other Ambulatory Visit: Payer: Self-pay

## 2021-09-18 ENCOUNTER — Ambulatory Visit (HOSPITAL_COMMUNITY)
Admission: RE | Admit: 2021-09-18 | Discharge: 2021-09-18 | Disposition: A | Discharge status: Home / Self Care | Payer: Medicare Other | Source: Ambulatory Visit | Attending: Rheumatology | Admitting: Rheumatology

## 2021-09-18 DIAGNOSIS — M0579 Rheumatoid arthritis with rheumatoid factor of multiple sites without organ or systems involvement: Secondary | ICD-10-CM | POA: Insufficient documentation

## 2021-09-18 MED ORDER — ACETAMINOPHEN 325 MG PO TABS: 650.0000 mg | ORAL_TABLET | ORAL | Status: DC

## 2021-09-18 MED ORDER — TOCILIZUMAB 400 MG/20ML IV SOLN
4.0000 mg/kg | INTRAVENOUS | Status: DC
  Administered 2021-09-18: 422 mg via INTRAVENOUS
  Filled 2021-09-18: qty 4

## 2021-09-18 MED ORDER — DIPHENHYDRAMINE HCL 25 MG PO CAPS
25.0000 mg | ORAL_CAPSULE | ORAL | Status: DC
Start: 2021-09-18 — End: 2021-09-19

## 2021-09-21 ENCOUNTER — Other Ambulatory Visit: Payer: Self-pay | Admitting: Pharmacist

## 2021-09-21 DIAGNOSIS — M0579 Rheumatoid arthritis with rheumatoid factor of multiple sites without organ or systems involvement: Secondary | ICD-10-CM

## 2021-09-21 DIAGNOSIS — Z79899 Other long term (current) drug therapy: Secondary | ICD-10-CM

## 2021-09-21 DIAGNOSIS — Z111 Encounter for screening for respiratory tuberculosis: Secondary | ICD-10-CM

## 2021-09-21 NOTE — Progress Notes (Signed)
Next infusion scheduled for Actemra IV on 10/16/21 and due for updated orders. Diagnosis: rheumatoid arthritis  Dose: Actemra 4mg /kg every 28 days (418mg  based on last recorded weight of 104.3kg)  Last Clinic Visit: 06/21/21 Next Clinic Visit: 11/26/21  Last infusion: 09/18/21  Labs: CBC and CMP on 07/31/21 - Glucose is elevated, creatinine is elevated.  Most likely due to the use of diuretics, CBC wnl TB Gold: negative on 02/01/21   Orders placed for Actemra IV x 3 doses along with premedication of acetaminophen and diphenhydramine to be administered 30 minutes before medication infusion.  Standing CBC with diff/platelet and CMP with GFR orders placed to be drawn every 2 months.  Order placed for lipid panel for yearly monitoring while taking Actemra.  Next TB gold due 02/01/22  Knox Saliva, PharmD, MPH, BCPS Clinical Pharmacist (Rheumatology and Pulmonology)

## 2021-09-28 DIAGNOSIS — K219 Gastro-esophageal reflux disease without esophagitis: Secondary | ICD-10-CM | POA: Diagnosis not present

## 2021-09-28 DIAGNOSIS — I50812 Chronic right heart failure: Secondary | ICD-10-CM | POA: Diagnosis not present

## 2021-09-28 DIAGNOSIS — J449 Chronic obstructive pulmonary disease, unspecified: Secondary | ICD-10-CM | POA: Diagnosis not present

## 2021-10-02 ENCOUNTER — Other Ambulatory Visit: Payer: Self-pay | Admitting: Pulmonary Disease

## 2021-10-02 ENCOUNTER — Other Ambulatory Visit: Payer: Self-pay | Admitting: Cardiovascular Disease

## 2021-10-02 ENCOUNTER — Other Ambulatory Visit: Payer: Self-pay | Admitting: Rheumatology

## 2021-10-02 ENCOUNTER — Other Ambulatory Visit: Payer: Self-pay | Admitting: Physician Assistant

## 2021-10-02 NOTE — Telephone Encounter (Signed)
Next Visit: 11/26/2021  Last Visit: 06/21/2021  Last Fill: 06/08/2021  DX: Rheumatoid arthritis involving multiple sites with positive rheumatoid factor, Chronic idiopathic gout involving toe without tophus, Age-related osteoporosis without current pathological fracture   Current Dose per office note 06/21/2021: Arava 20 mg 1 tablet by mouth daily, allopurinol 100 mg BID, Fosamax 70 mg 1 tablet by mouth once weekly   Labs: 07/31/2021 Glucose is elevated, creatinine is elevated.  Most likely due to the use of diuretics.  CBC is normal.  Okay to refill Arava, Allopurinol and fosamax?

## 2021-10-08 ENCOUNTER — Telehealth: Payer: Self-pay | Admitting: Rheumatology

## 2021-10-08 NOTE — Telephone Encounter (Signed)
Please schedule an appointment in our office or if he has an orthopedist for further evaluation.

## 2021-10-08 NOTE — Telephone Encounter (Signed)
Patient having right side pain that wraps around to back that started 2-3 weeks ago. Pain would come and go at first, now constant. Pain does not wake him up at night. Patient states when he pushes on side he can feel it. Patient has had no injury that he knows of. Does patient need to come in for an appointment? Please call to advise.

## 2021-10-08 NOTE — Telephone Encounter (Signed)
Patient having right side pain that wraps around to back that started 2-3 weeks ago. Pain would come and go at first, now constant. Pain does not wake him up at night. Patient states when he pushes on side he can feel it. Patient describes the pain as soreness.  Patient has had no injury that he knows of. Patient is on [place] and taking it as prescribed. Patient staets his last Actemra infusion was about 3 weeks ago. Does patient need to come in for an appointment? Do you have any recommendations.

## 2021-10-08 NOTE — Telephone Encounter (Signed)
Patient scheduled an appointment for 10/10/2021 at 10:20 am.

## 2021-10-09 NOTE — Progress Notes (Addendum)
Office Visit Note  Patient: [name]             Date of Birth: Jul 20, 1951           MRN: [id number]             PCP: [name], MD Referring: [name], MD Visit Date: 10/10/2021 Occupation: @GUAROCC @  Subjective:  Right sided low back pain   History of Present Illness: [name] is a 70 y.o. male with history of seropositive rheumatoid arthritis, osteoarthritis, osteoporosis, and gout.  He is on Actemra 4 mg/kg IV infusions every 4 weeks and Arava 20 mg 1 tablet daily.  His last infusion was on 09/18/21.  He denies any recent rheumatoid arthritis flares.  He continues to experience stiffness in both hands but denies any joint swelling currently.  He states for the past 3 to 4 weeks he has been experiencing right-sided mid to lower back pain.  He denies any injury prior to the onset of symptoms.  He does not recall lifting anything heavy.  He denies any muscle spasms currently.  He states that around the same time he found out that he had an abscessed tooth.  He was evaluated by his dentist and was prescribed a prescription for antibiotics which she has not started taking.  He is scheduled for a tooth extraction on 11/03/2021.  He denies any fevers or chills.  He states his back pain initially started midline but has started to wrap around his right side along his ribs.  He describes the pain as a burning sensation.  He denies any radiating pain.  He denies any numbness.  He has not noticed any rashes.  He has a history of shingles and is unsure if his symptoms are similar to when he had shingles.  He denies any bowel or bladder incontinence.  He has not noticed any weakness.  He has no difficulty ambulating. He denies any gout flares.  He takes allopurinol 100 mg 2 tablets daily.      Activities of Daily Living:  Patient reports morning stiffness for 0 minutes.   Patient Denies nocturnal pain.  Difficulty dressing/grooming: Denies Difficulty climbing stairs:  Denies Difficulty getting out of chair: Denies Difficulty using hands for taps, buttons, cutlery, and/or writing: Denies  Review of Systems  Constitutional:  Negative for fatigue.  HENT:  Positive for mouth dryness. Negative for mouth sores and nose dryness.   Eyes:  Negative for pain, itching and dryness.  Respiratory:  Positive for shortness of breath. Negative for difficulty breathing.   Cardiovascular:  Negative for chest pain and palpitations.  Gastrointestinal:  Positive for constipation. Negative for blood in stool and diarrhea.  Endocrine: Negative for increased urination.  Genitourinary:  Negative for difficulty urinating.  Musculoskeletal:  Positive for joint pain, joint pain, myalgias and myalgias. Negative for joint swelling, morning stiffness and muscle tenderness.  Skin:  Negative for color change, rash and redness.  Allergic/Immunologic: Negative for susceptible to infections.  Neurological:  Negative for dizziness, numbness, headaches, memory loss and weakness.  Hematological:  Positive for bruising/bleeding tendency.  Psychiatric/Behavioral:  Negative for confusion.    PMFS History:  Patient Active Problem List   Diagnosis Date Noted   Bronchiectasis without complication ([place]) 54/98/2641   Incontinence when straining, male 03/20/2021   Erectile dysfunction after radical prostatectomy 09/08/2020   OAB (overactive bladder) 02/23/2020   Class 2 severe obesity due to excess calories with serious comorbidity and body mass index (BMI)  of 35.0 to 35.9 in adult [facility])    Chronic diastolic HF (heart failure) ([place])    Serratia sepsis ([place]) 06/08/2019   Bacteremia due to Gram-negative bacteria 06/08/2019   Voice hoarseness 04/26/2019   OSA (obstructive sleep apnea) 03/10/2019   Abnormal CT of the chest 02/09/2019   Dyspnea on exertion 10/22/2017   History of seizure disorder 02/27/2017   Primary osteoarthritis of both hands 02/27/2017   Primary osteoarthritis of both feet  02/27/2017   Idiopathic gout of multiple sites 02/27/2017   High risk medication use 12/29/2016   History of prostate cancer 12/29/2016   Primary osteoarthritis of both knees 12/29/2016   Chronic idiopathic gout involving toe without tophus 12/29/2016   History of CHF (congestive heart failure) 12/29/2016   History of COPD 12/29/2016   [place] pustular psoriasis 12/29/2016   DVT (deep venous thrombosis) ([place]) 10/31/2016   COPD with acute exacerbation ([place]) 10/31/2016   CHF (congestive heart failure) ([place]) 10/31/2016   Prostate cancer ([place]) 08/30/2015   Malignant neoplasm of prostate ([place]) 07/04/2015   Chest pain at rest 07/05/2014   Weakness 58/08/9832   Complicated postphlebitic syndrome 11/16/2013   Personal history of DVT (deep vein thrombosis) 05/18/2013   Chronic anticoagulation 05/18/2013   Diverticulosis of colon without hemorrhage 05/18/2013   Rheumatoid arthritis ([place]) 05/18/2013   Seizure disorder ([place]) 05/18/2013   Hx of adenomatous colonic polyps 05/18/2013   GERD 05/08/2010   NAUSEA 05/08/2010   FLATULENCE-GAS-BLOATING 05/08/2010    Past Medical History:  Diagnosis Date   Arthritis    RA   Asthma    CHF (congestive heart failure) ([place])    pt denies    Clotting disorder ([place])    DVT both legs    COPD (chronic obstructive pulmonary disease) ([place])    DDD (degenerative disc disease), cervical    with UE's paresthesias   Diverticulosis    DVT, lower extremity ([place])    bilat   Eye abnormality    right eye drifts has difficulty focusing with right eye has had since birth    GERD (gastroesophageal reflux disease)    Gout    History of measles    History of shingles    Hypercholesterolemia    IBS (irritable bowel syndrome)    IBS (irritable bowel syndrome)    Peripheral edema    Pneumonia    hx of    Prostate cancer ([place])    Rheumatoid arthritis ([place])    Seizures ([place])    last seizure 20 years ago; on dilantin. Unknown origin.   Shortness of breath  dyspnea    exertion    Sleep apnea    cpap    Tubular adenoma of colon 04/2008    Family History  Problem Relation Age of Onset   Skin cancer Father    Stomach cancer Father    Heart attack Father    Alzheimer's disease Mother    Throat cancer Brother    Stomach cancer Brother    Lung cancer Brother    Heart attack Brother    Heart attack Brother    Breast cancer Sister    Heart attack Sister    Stroke Sister    Bladder Cancer Sister    Heart murmur Sister    Colon cancer Brother    Colon polyps Neg Hx    Past Surgical History:  Procedure Laterality Date   CHOLECYSTECTOMY     CIRCUMCISION     COLONOSCOPY     CYSTOSCOPY WITH  LITHOLAPAXY N/A 03/17/2019   Procedure: CYSTOSCOPY WITH LITHOLAPAXY AND REMOVAL OF FOREIGN BODY;  Surgeon: [name], MD;  Location: [facility];  Service: Urology;  Laterality: N/A;   DOPPLER ECHOCARDIOGRAPHY N/A 01-21-2012   TECHNICALLY DIFFICULT. MILD CONCENTRIC LV HYPERTROPHY. LV CAVITY IS SMALL.. EF=> 55%. TRANSMITRAL SPECTRAL FLOW PATTREN IS SUGGESTIVE OF IMPAIRED LV RELAXATION. RV SYSTOLIC PRESSURE IS 75TZGY. LEFT ATRIAL SIZE IS NORMAL. AV APPEARS MILDLY SCLEROTIC. NO SIGN VALVE DISEASE NOTED.   LYMPHADENECTOMY Bilateral 08/30/2015   Procedure: PELVIC LYMPHADENECTOMY;  Surgeon: [name], MD;  Location: [facility];  Service: Urology;  Laterality: Bilateral;   NUCLEAR STRESS TEST N/A 01-21-2012   NORMAL PATTERN OF PERFUSION IN ALL REGIONS. POST STRESS LV SIZE IS NORMAL. NO EVIDENCE OF INDUCIBLE ISCHEMIA. EF 54%.   POLYPECTOMY     PROSTATE BIOPSY     ROBOT ASSISTED LAPAROSCOPIC RADICAL PROSTATECTOMY N/A 08/30/2015   Procedure: ROBOTIC ASSISTED LAPAROSCOPIC RADICAL PROSTATECTOMY;  Surgeon: [name], MD;  Location: [facility];  Service: Urology;  Laterality: N/A;   URINARY SPHINCTER IMPLANT N/A 03/20/2021   Procedure: ARTIFICIAL URINARY SPHINCTER CYSTOSCOPY;  Surgeon: Bjorn Loser, MD;  Location: [facility];  Service: Urology;  Laterality:  N/A;  REQUESTING 90 MINS   US VENOUS LOWER EXT Right 03/07/11   PERSISTENT DVT IN RIGHT LOWER EXT.WITH PERSISTANT VISUALIZATION OF HYPOECHOIC THROMBUS WITHIN THE FEMORAL, PROFUNDA FEMORAL AND POPLITEAL VEINS. WHEN COMPARED TO PREVIOUS, CLOT IS NO LONGER IDENTIFIED WITH IN THE RIGHT CFV.   Social History   Social History Narrative   Not on file   Immunization History  Administered Date(s) Administered   Fluad Quad(high Dose 65+) 09/30/2019   Influenza, High Dose Seasonal PF 10/05/2018   Influenza,inj,Quad PF,6+ Mos 08/31/2015   Influenza-Unspecified 09/30/2016, 08/22/2017   Moderna Sars-Covid-2 Vaccination 01/21/2020, 02/18/2020, 10/25/2020   Pneumococcal Conjugate-13 10/05/2018   Pneumococcal-Unspecified 12/30/2012     Objective: Vital Signs: BP (!) 177/94 (BP Location: Left Arm, Patient Position: Sitting, Cuff Size: Normal)   Pulse 66   Ht 5\' 9"  (1.753 m)   Wt 231 lb (104.8 kg)   BMI 34.11 kg/m    Physical Exam Vitals and nursing note reviewed.  Constitutional:      Appearance: He is well-developed.  HENT:     Head: Normocephalic and atraumatic.  Eyes:     Conjunctiva/sclera: Conjunctivae normal.     Pupils: Pupils are equal, round, and reactive to light.  Pulmonary:     Effort: Pulmonary effort is normal.  Abdominal:     Palpations: Abdomen is soft.  Musculoskeletal:     Cervical back: Normal range of motion and neck supple.  Skin:    General: Skin is warm and dry.     Capillary Refill: Capillary refill takes less than 2 seconds.  Neurological:     Mental Status: He is alert and oriented to person, place, and time.  Psychiatric:        Behavior: Behavior normal.     Musculoskeletal Exam: C-spine has good ROM with no discomfort. Postural thoracic kyphosis noted.  No midline spinal tenderness.  Mid-back paraspinal muscle tenderness on the right side.  Shoulder joints, elbow joints, and wrist joints have good range of motion with no discomfort.  PIP and DIP thickening  consistent with osteoarthritis of both hands.  Complete fist formation bilaterally.  Hip joints have good range of motion with no discomfort.  Knee joints have good range of motion with no warmth or effusion.  Ankle joints have good range  of motion with no tenderness.  Pedal edema noted bilaterally.  CDAI Exam: CDAI Score: 0.6  Patient Global: 3 mm; Provider Global: 3 mm Swollen: 0 ; Tender: 0  Joint Exam 10/10/2021   No joint exam has been documented for this visit   There is currently no information documented on the homunculus. Go to the Rheumatology activity and complete the homunculus joint exam.  Investigation: No additional findings.  Imaging: No results found.  Recent Labs: Lab Results  Component Value Date   WBC 5.0 07/31/2021   HGB 14.5 07/31/2021   PLT 160 07/31/2021   NA 138 07/31/2021   K 4.1 07/31/2021   CL 100 07/31/2021   CO2 29 07/31/2021   GLUCOSE 120 (H) 07/31/2021   BUN 15 07/31/2021   CREATININE 1.46 (H) 07/31/2021   BILITOT 0.6 07/31/2021   ALKPHOS 56 07/31/2021   AST 33 07/31/2021   ALT 29 07/31/2021   PROT 6.4 (L) 07/31/2021   ALBUMIN 3.9 07/31/2021   CALCIUM 9.5 07/31/2021   GFRAA >60 09/20/2020   QFTBGOLDPLUS Negative 02/01/2021    Speciality Comments: ACTEMRA 4mg /kg x 4 weeks PPD negative 01/02/17  Procedures:  No procedures performed Allergies: Orencia [abatacept], Carbamazepine, Celecoxib, Cephalexin, Enbrel [etanercept], Humira [adalimumab], Levofloxacin, Sulfa antibiotics, and Sulfasalazine      Assessment / Plan:     Visit Diagnoses: Rheumatoid arthritis involving multiple sites with positive rheumatoid factor ([place]): He has no synovitis on examination today.  He has not had any recent rheumatoid arthritis flares.  He has clinically been doing well on Actemra 4 mg/kg IV infusions every 4 weeks and Arava 20 mg 1 tablet daily.  His last Actemra infusion was on 09/18/2021.  He experiences stiffness in both hands and both wrist joints but  has no inflammation on examination today.  He is scheduled for a tooth extraction on 11/03/2021 for an abscessed tooth.  He was advised to hold [place] and Actemra until after he has been cleared by his dentist following a tooth extraction.  He voiced understanding.  He was advised to notify us if he develops signs or symptoms of a flare.    High risk medication use - Actemra 4mg /kg every 4 weeks and Arava 20 mg 1 tablet by mouth daily.  CBC and CMP were drawn on 07/31/2021.  He has lab work drawn with his infusions.  TB gold was negative on 02/01/2021. He currently has an abscessed tooth and is scheduled for an extraction on 11/03/21.  He was advised to start taking the antibiotics prescribed by his dentist.  He was advised to postpone his Actemra infusion this month and to hold [place] until after he has been cleared by his dentist status post tooth extraction.   Discussed the importance of postponing his actemra infusions and holding arava anytime he develops signs or symptoms of an infection and to resume once the infection has cleared.   History of abscessed tooth: He currently has an abscessed tooth which has been bothersome for the past 3 to 4 weeks.  He has not had any fevers or chills.  His dentist sent in a prescription for an antibiotic but he has not initiated therapy yet.  He was advised to hold Actemra and [place] until he has been cleared by his dentist after the tooth extraction on 11/03/2021.  He voiced understanding.  Chronic idiopathic gout involving toe without tophus, unspecified laterality - He remains on allopurinol 100 mg 2 tablets daily for management of gout.  He has not  had any signs or symptoms of a gout flare.  His uric acid was within the desirable range-5.7 on 07/03/2021.  Primary osteoarthritis of both hands: He has PIP and DIP thickening consistent with osteoarthritis of both hands.  He experiences stiffness in both hands but has no tenderness or inflammation on examination.  Primary  osteoarthritis of both knees: He has good range of motion in both knee joints with no discomfort.  No warmth or effusion was noted.  Primary osteoarthritis of both feet: He has not experiencing any increased discomfort in his feet at this time.  Pedal edema was noted bilaterally.  Age-related osteoporosis without current pathological fracture - DEXA on 09/28/2019 T-score -2.1 right femur neck with +12 % change in BMD. He is in taking Fosamax 70 mg 1 tablet by mouth once weekly since October 2018.  He is due to update his bone density.  A future order for DEXA remains in place.  Dr. Estanislado Pandy discussed a drug holiday from Fosamax if his bone density is stable or has improved.  He was advised to call to schedule an updated DEXA.   Mid back pain on right side -He presents today with mid back pain that started 3 to 4 weeks ago.  No injury prior to the onset of symptoms.  He does not recall lifting any heavy objects prior to the onset of symptoms.  He states the pain started right along his spine but has started to spread around his right side.  He has not been experiencing any muscle spasms.  He is not experiencing any numbness, weakness, bowel or bladder incontinence, fevers, or chills.  He describes the pain as a burning sensation.  He has a history of shingles but is unsure if the pain is similar to when he had shingles in the past.  No rash or vesicular lesions were noted on examination today.  He had no midline spinal tenderness.  He has some soft tissue and paraspinal muscle tenderness as well as limited lateral flexion to the right of the spine.  Postural thoracic kyphosis was also noted.  X-rays of the thoracic and lumbar spine were obtained today.  His symptoms and x-ray results were consistent with a thoracic spine and possible early DISH.[name]  The diagnosis was discussed today in detail as well as treatment options.  A referral to physical therapy will be placed today.  He was advised to notify us if his  symptoms persist or worsen. Discussed warning signs to monitor for in detail including fever, chills, rash, paresthesias, or bowel or bladder incontinence.  If he develops any new or worsening symptoms he was advised to be evaluated urgently.  Other medical conditions are listed as follows:   History of COPD  History of seizure disorder  History of CHF (congestive heart failure)  History of DVT (deep vein thrombosis)  History of prostate cancer  History of adenomatous polyp of colon  Orders: Orders Placed This Encounter  Procedures   XR Thoracic Spine 2 View   XR Lumbar Spine 2-3 Views   No orders of the defined types were placed in this encounter.    Follow-Up Instructions: Return for Rheumatoid arthritis, Osteoarthritis, Osteoporosis, Gout.   Ofilia Neas, PA-C  Note - This record has been created using Dragon software.  Chart creation errors have been sought, but may not always  have been located. Such creation errors do not reflect on  the standard of medical care.

## 2021-10-10 ENCOUNTER — Ambulatory Visit (INDEPENDENT_AMBULATORY_CARE_PROVIDER_SITE_OTHER): Payer: Medicare Other | Admitting: Physician Assistant

## 2021-10-10 ENCOUNTER — Ambulatory Visit: Payer: Self-pay

## 2021-10-10 ENCOUNTER — Other Ambulatory Visit: Payer: Self-pay

## 2021-10-10 ENCOUNTER — Encounter: Payer: Self-pay | Admitting: Physician Assistant

## 2021-10-10 VITALS — BP 177/94 | HR 66 | Ht 69.0 in | Wt 231.0 lb

## 2021-10-10 DIAGNOSIS — Z8719 Personal history of other diseases of the digestive system: Secondary | ICD-10-CM

## 2021-10-10 DIAGNOSIS — Z8546 Personal history of malignant neoplasm of prostate: Secondary | ICD-10-CM

## 2021-10-10 DIAGNOSIS — M1A0790 Idiopathic chronic gout, unspecified ankle and foot, without tophus (tophi): Secondary | ICD-10-CM | POA: Diagnosis not present

## 2021-10-10 DIAGNOSIS — M19071 Primary osteoarthritis, right ankle and foot: Secondary | ICD-10-CM

## 2021-10-10 DIAGNOSIS — M170 Bilateral primary osteoarthritis of knee: Secondary | ICD-10-CM | POA: Diagnosis not present

## 2021-10-10 DIAGNOSIS — M19042 Primary osteoarthritis, left hand: Secondary | ICD-10-CM

## 2021-10-10 DIAGNOSIS — M19072 Primary osteoarthritis, left ankle and foot: Secondary | ICD-10-CM

## 2021-10-10 DIAGNOSIS — Z8669 Personal history of other diseases of the nervous system and sense organs: Secondary | ICD-10-CM

## 2021-10-10 DIAGNOSIS — Z86010 Personal history of colonic polyps: Secondary | ICD-10-CM

## 2021-10-10 DIAGNOSIS — Z8709 Personal history of other diseases of the respiratory system: Secondary | ICD-10-CM | POA: Diagnosis not present

## 2021-10-10 DIAGNOSIS — Z79899 Other long term (current) drug therapy: Secondary | ICD-10-CM

## 2021-10-10 DIAGNOSIS — M549 Dorsalgia, unspecified: Secondary | ICD-10-CM | POA: Diagnosis not present

## 2021-10-10 DIAGNOSIS — Z8679 Personal history of other diseases of the circulatory system: Secondary | ICD-10-CM | POA: Diagnosis not present

## 2021-10-10 DIAGNOSIS — M810 Age-related osteoporosis without current pathological fracture: Secondary | ICD-10-CM

## 2021-10-10 DIAGNOSIS — M19041 Primary osteoarthritis, right hand: Secondary | ICD-10-CM

## 2021-10-10 DIAGNOSIS — M0579 Rheumatoid arthritis with rheumatoid factor of multiple sites without organ or systems involvement: Secondary | ICD-10-CM

## 2021-10-10 DIAGNOSIS — Z860101 Personal history of adenomatous and serrated colon polyps: Secondary | ICD-10-CM

## 2021-10-10 DIAGNOSIS — Z23 Encounter for immunization: Secondary | ICD-10-CM | POA: Diagnosis not present

## 2021-10-10 DIAGNOSIS — Z86718 Personal history of other venous thrombosis and embolism: Secondary | ICD-10-CM

## 2021-10-10 NOTE — Addendum Note (Signed)
Addended by: Earnestine Mealing on: 10/10/2021 10:52 AM   Modules accepted: Orders

## 2021-10-11 DIAGNOSIS — I8311 Varicose veins of right lower extremity with inflammation: Secondary | ICD-10-CM | POA: Diagnosis not present

## 2021-10-11 DIAGNOSIS — I82409 Acute embolism and thrombosis of unspecified deep veins of unspecified lower extremity: Secondary | ICD-10-CM | POA: Diagnosis not present

## 2021-10-11 DIAGNOSIS — Z79899 Other long term (current) drug therapy: Secondary | ICD-10-CM | POA: Diagnosis not present

## 2021-10-16 ENCOUNTER — Inpatient Hospital Stay (HOSPITAL_COMMUNITY): Admission: RE | Admit: 2021-10-16 | Payer: Medicare Other | Source: Ambulatory Visit

## 2021-10-16 DIAGNOSIS — M549 Dorsalgia, unspecified: Secondary | ICD-10-CM | POA: Diagnosis not present

## 2021-10-18 DIAGNOSIS — M549 Dorsalgia, unspecified: Secondary | ICD-10-CM | POA: Diagnosis not present

## 2021-10-23 DIAGNOSIS — M549 Dorsalgia, unspecified: Secondary | ICD-10-CM | POA: Diagnosis not present

## 2021-10-25 DIAGNOSIS — Z01818 Encounter for other preprocedural examination: Secondary | ICD-10-CM | POA: Diagnosis not present

## 2021-10-25 DIAGNOSIS — M549 Dorsalgia, unspecified: Secondary | ICD-10-CM | POA: Diagnosis not present

## 2021-11-04 ENCOUNTER — Other Ambulatory Visit: Payer: Self-pay | Admitting: Cardiovascular Disease

## 2021-11-06 DIAGNOSIS — M549 Dorsalgia, unspecified: Secondary | ICD-10-CM | POA: Diagnosis not present

## 2021-11-08 DIAGNOSIS — M549 Dorsalgia, unspecified: Secondary | ICD-10-CM | POA: Diagnosis not present

## 2021-11-12 NOTE — Progress Notes (Deleted)
Office Visit Note  Patient: [name]             Date of Birth: 1951-04-04           MRN: [id number]             PCP: [name], MD Referring: [name], MD Visit Date: 11/26/2021 Occupation: @GUAROCC @  Subjective:    History of Present Illness: [name] is a 70 y.o. male with history of seropositive rheumatoid arthritis, osteoarthritis, gout, and osteoporosis.  [name] is on actemra 4 mg/kg IV infusions every month and arava 20 mg 1 tablet by mouth daily.   [name] is taking allopurinol 100 mg 2 tablets by mouth daily. Uric acid was 5.7 on 07/03/21.   TB gold negative on 02/01/21.  CBC and CMP updated on 07/31/21.  [name] is due to update lab work today.   Activities of Daily Living:  Patient reports morning stiffness for *** {minute/hour:19697}.   Patient {ACTIONS;DENIES/REPORTS:21021675::"Denies"} nocturnal pain.  Difficulty dressing/grooming: {ACTIONS;DENIES/REPORTS:21021675::"Denies"} Difficulty climbing stairs: {ACTIONS;DENIES/REPORTS:21021675::"Denies"} Difficulty getting out of chair: {ACTIONS;DENIES/REPORTS:21021675::"Denies"} Difficulty using hands for taps, buttons, cutlery, and/or writing: {ACTIONS;DENIES/REPORTS:21021675::"Denies"}  No Rheumatology ROS completed.   PMFS History:  Patient Active Problem List   Diagnosis Date Noted   Bronchiectasis without complication ([place]) 10/26/2535   Incontinence when straining, male 03/20/2021   Erectile dysfunction after radical prostatectomy 09/08/2020   OAB (overactive bladder) 02/23/2020   Class 2 severe obesity due to excess calories with serious comorbidity and body mass index (BMI) of 35.0 to 35.9 in adult [facility])    Chronic diastolic HF (heart failure) ([place])    Serratia sepsis ([place]) 06/08/2019   Bacteremia due to Gram-negative bacteria 06/08/2019   Voice hoarseness 04/26/2019   OSA (obstructive sleep apnea) 03/10/2019   Abnormal CT of the chest 02/09/2019   Dyspnea on exertion 10/22/2017   History of seizure  disorder 02/27/2017   Primary osteoarthritis of both hands 02/27/2017   Primary osteoarthritis of both feet 02/27/2017   Idiopathic gout of multiple sites 02/27/2017   High risk medication use 12/29/2016   History of prostate cancer 12/29/2016   Primary osteoarthritis of both knees 12/29/2016   Chronic idiopathic gout involving toe without tophus 12/29/2016   History of CHF (congestive heart failure) 12/29/2016   History of COPD 12/29/2016   Plantar pustular psoriasis 12/29/2016   DVT (deep venous thrombosis) ([place]) 10/31/2016   COPD with acute exacerbation ([place]) 10/31/2016   CHF (congestive heart failure) ([place]) 10/31/2016   Prostate cancer ([place]) 08/30/2015   Malignant neoplasm of prostate ([place]) 07/04/2015   Chest pain at rest 07/05/2014   Weakness 64/40/3474   Complicated postphlebitic syndrome 11/16/2013   Personal history of DVT (deep vein thrombosis) 05/18/2013   Chronic anticoagulation 05/18/2013   Diverticulosis of colon without hemorrhage 05/18/2013   Rheumatoid arthritis ([place]) 05/18/2013   Seizure disorder ([place]) 05/18/2013   Hx of adenomatous colonic polyps 05/18/2013   GERD 05/08/2010   NAUSEA 05/08/2010   FLATULENCE-GAS-BLOATING 05/08/2010    Past Medical History:  Diagnosis Date   Arthritis    RA   Asthma    CHF (congestive heart failure) ([place])    pt denies    Clotting disorder ([place])    DVT both legs    COPD (chronic obstructive pulmonary disease) ([place])    DDD (degenerative disc disease), cervical    with UE's paresthesias   Diverticulosis    DVT, lower extremity ([place])    bilat   Eye abnormality  right eye drifts has difficulty 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 Unknown origin.   Shortness of breath dyspnea    exertion    Sleep apnea    cpap    Tubular adenoma of colon 04/2008    Family History  Problem Relation Age of Onset   Skin cancer Father    Stomach cancer Father    Heart attack Father    Alzheimer's disease Mother    Throat cancer Brother    Stomach cancer Brother    Lung cancer Brother    Heart attack Brother    Heart attack Brother    Breast cancer Sister    Heart attack Sister    Stroke Sister    Bladder Cancer Sister    Heart murmur Sister    Colon cancer Brother    Colon polyps Neg Hx    Past Surgical History:  Procedure Laterality Date   CHOLECYSTECTOMY     CIRCUMCISION     COLONOSCOPY     CYSTOSCOPY WITH LITHOLAPAXY N/A 03/17/2019   Procedure: CYSTOSCOPY WITH LITHOLAPAXY AND REMOVAL OF FOREIGN BODY;  Surgeon: [name], MD;  Location: [facility];  Service: Urology;  Laterality: N/A;   DOPPLER ECHOCARDIOGRAPHY N/A 01-21-2012   TECHNICALLY DIFFICULT. MILD CONCENTRIC LV HYPERTROPHY. LV CAVITY IS SMALL.. EF=> 55%. TRANSMITRAL SPECTRAL FLOW PATTREN IS SUGGESTIVE OF IMPAIRED LV RELAXATION. RV SYSTOLIC PRESSURE IS 08XKGY. LEFT ATRIAL SIZE IS NORMAL. AV APPEARS MILDLY SCLEROTIC. NO SIGN VALVE DISEASE NOTED.   LYMPHADENECTOMY Bilateral 08/30/2015   Procedure: PELVIC LYMPHADENECTOMY;  Surgeon: [name], MD;  Location: [facility];  Service: Urology;  Laterality: Bilateral;   NUCLEAR STRESS TEST N/A 01-21-2012   NORMAL PATTERN OF PERFUSION IN ALL REGIONS. POST STRESS LV SIZE IS NORMAL. NO EVIDENCE OF INDUCIBLE ISCHEMIA. EF 54%.   POLYPECTOMY     PROSTATE BIOPSY     ROBOT ASSISTED LAPAROSCOPIC RADICAL PROSTATECTOMY N/A 08/30/2015   Procedure: ROBOTIC ASSISTED LAPAROSCOPIC RADICAL PROSTATECTOMY;  Surgeon: [name], MD;  Location: [facility];  Service: Urology;  Laterality: N/A;   URINARY SPHINCTER IMPLANT N/A 03/20/2021   Procedure: ARTIFICIAL  URINARY SPHINCTER CYSTOSCOPY;  Surgeon: Bjorn Loser, MD;  Location: [facility];  Service: Urology;  Laterality: N/A;  REQUESTING 90 MINS   US VENOUS LOWER EXT Right 03/07/11   PERSISTENT DVT IN RIGHT LOWER EXT.WITH PERSISTANT VISUALIZATION OF HYPOECHOIC THROMBUS WITHIN THE FEMORAL, PROFUNDA FEMORAL AND POPLITEAL VEINS. WHEN COMPARED TO PREVIOUS, CLOT IS NO LONGER IDENTIFIED WITH IN THE RIGHT CFV.   Social History   Social History Narrative   Not on file   Immunization History  Administered Date(s) Administered   Fluad Quad(high Dose 65+) 09/30/2019   Influenza, High Dose Seasonal PF 10/05/2018   Influenza,inj,Quad PF,6+ Mos 08/31/2015   Influenza-Unspecified 09/30/2016, 08/22/2017   Moderna Sars-Covid-2 Vaccination 10/25/2020   Pneumococcal Conjugate-13 10/05/2018   Pneumococcal-Unspecified 12/30/2012     Objective: Vital Signs: There were no vitals taken for this visit.   Physical Exam Vitals and nursing  note reviewed.  Constitutional:      Appearance: [name] is well-developed.  HENT:     Head: Normocephalic and atraumatic.  Eyes:     Conjunctiva/sclera: Conjunctivae normal.     Pupils: Pupils are equal, round, and reactive to light.  Pulmonary:     Effort: Pulmonary effort is normal.  Abdominal:     Palpations: Abdomen is soft.  Musculoskeletal:     Cervical back: Normal range of motion and neck supple.  Skin:    General: Skin is warm and dry.     Capillary Refill: Capillary refill takes less than 2 seconds.  Neurological:     Mental Status: [name] is alert and oriented to person, place, and time.  Psychiatric:        Behavior: Behavior normal.     Musculoskeletal Exam: ***  CDAI Exam: CDAI Score: -- Patient Global: --; Provider Global: -- Swollen: --; Tender: -- Joint Exam 11/26/2021   No joint exam has been documented for this visit   There is currently no information documented on the homunculus. Go to the Rheumatology activity and complete the homunculus joint  exam.  Investigation: No additional findings.  Imaging: No results found.  Recent Labs: Lab Results  Component Value Date   WBC 5.0 07/31/2021   HGB 14.5 07/31/2021   PLT 160 07/31/2021   NA 138 07/31/2021   K 4.1 07/31/2021   CL 100 07/31/2021   CO2 29 07/31/2021   GLUCOSE 120 (H) 07/31/2021   BUN 15 07/31/2021   CREATININE 1.46 (H) 07/31/2021   BILITOT 0.6 07/31/2021   ALKPHOS 56 07/31/2021   AST 33 07/31/2021   ALT 29 07/31/2021   PROT 6.4 (L) 07/31/2021   ALBUMIN 3.9 07/31/2021   CALCIUM 9.5 07/31/2021   GFRAA >60 09/20/2020   QFTBGOLDPLUS Negative 02/01/2021    Speciality Comments: ACTEMRA 4mg /kg x 4 weeks PPD negative 01/02/17  Procedures:  No procedures performed Allergies: Orencia [abatacept], Carbamazepine, Celecoxib, Cephalexin, Enbrel [etanercept], Humira [adalimumab], Levofloxacin, Sulfa antibiotics, and Sulfasalazine   Assessment / Plan:     Visit Diagnoses: No diagnosis found.  Orders: No orders of the defined types were placed in this encounter.  No orders of the defined types were placed in this encounter.   Face-to-face time spent with patient was *** minutes. Greater than 50% of time was spent in counseling and coordination of care.  Follow-Up Instructions: No follow-ups on file.   Earnestine Mealing, CMA  Note - This record has been created using Editor, commissioning.  Chart creation errors have been sought, but may not always  have been located. Such creation errors do not reflect on  the standard of medical care.

## 2021-11-13 ENCOUNTER — Telehealth: Payer: Self-pay | Admitting: *Deleted

## 2021-11-13 ENCOUNTER — Ambulatory Visit (HOSPITAL_COMMUNITY): Payer: Medicare Other

## 2021-11-13 DIAGNOSIS — M549 Dorsalgia, unspecified: Secondary | ICD-10-CM | POA: Diagnosis not present

## 2021-11-13 NOTE — Telephone Encounter (Addendum)
Attempted to contact the patient and left message for patient or his wife to return call to the office.   Patient had a no show on his infusion in October and then had a No Show/Cancel within 24 hours today. Calling to follow up with patient to see if he is doing okay.

## 2021-11-14 NOTE — Telephone Encounter (Signed)
Spoke with patient states he had a tooth abscess and then had surgery to have his wisdom teeth removed. Patient states that's why he has not had his last couple of infusions. Patient advised we will need clearance from his surgeon prior to him rescheduling his infusion.

## 2021-11-15 ENCOUNTER — Telehealth: Payer: Self-pay

## 2021-11-15 NOTE — Telephone Encounter (Signed)
Eric from [employer] left a voicemail stating they received a fax from our office about the anticipated date of treament for the patient.  The office put the anticipated date of treatment as 11/26/21, but the fax we sent was for the new year from January to February.  Please call back at [phone] if a re-verification is needed for the patient in January.

## 2021-11-15 NOTE — Telephone Encounter (Signed)
Returned call to [place] regarding verification of benefits for Actemra in 2023. Provided date of 01/08/2022. Nothing further needed.   Knox Saliva, PharmD, MPH, BCPS Clinical Pharmacist (Rheumatology and Pulmonology)

## 2021-11-16 DIAGNOSIS — M549 Dorsalgia, unspecified: Secondary | ICD-10-CM | POA: Diagnosis not present

## 2021-11-19 DIAGNOSIS — E782 Mixed hyperlipidemia: Secondary | ICD-10-CM | POA: Diagnosis not present

## 2021-11-19 DIAGNOSIS — K219 Gastro-esophageal reflux disease without esophagitis: Secondary | ICD-10-CM | POA: Diagnosis not present

## 2021-11-19 DIAGNOSIS — M1991 Primary osteoarthritis, unspecified site: Secondary | ICD-10-CM | POA: Diagnosis not present

## 2021-11-19 DIAGNOSIS — E6609 Other obesity due to excess calories: Secondary | ICD-10-CM | POA: Diagnosis not present

## 2021-11-19 DIAGNOSIS — Z01818 Encounter for other preprocedural examination: Secondary | ICD-10-CM | POA: Diagnosis not present

## 2021-11-19 DIAGNOSIS — M069 Rheumatoid arthritis, unspecified: Secondary | ICD-10-CM | POA: Diagnosis not present

## 2021-11-19 DIAGNOSIS — C61 Malignant neoplasm of prostate: Secondary | ICD-10-CM | POA: Diagnosis not present

## 2021-11-19 DIAGNOSIS — J449 Chronic obstructive pulmonary disease, unspecified: Secondary | ICD-10-CM | POA: Diagnosis not present

## 2021-11-19 DIAGNOSIS — Z6834 Body mass index (BMI) 34.0-34.9, adult: Secondary | ICD-10-CM | POA: Diagnosis not present

## 2021-11-19 DIAGNOSIS — Z1331 Encounter for screening for depression: Secondary | ICD-10-CM | POA: Diagnosis not present

## 2021-11-19 DIAGNOSIS — Z7901 Long term (current) use of anticoagulants: Secondary | ICD-10-CM | POA: Diagnosis not present

## 2021-11-19 DIAGNOSIS — I50812 Chronic right heart failure: Secondary | ICD-10-CM | POA: Diagnosis not present

## 2021-11-20 DIAGNOSIS — M549 Dorsalgia, unspecified: Secondary | ICD-10-CM | POA: Diagnosis not present

## 2021-11-26 ENCOUNTER — Ambulatory Visit: Payer: Medicare Other | Admitting: Physician Assistant

## 2021-11-26 DIAGNOSIS — M1A0790 Idiopathic chronic gout, unspecified ankle and foot, without tophus (tophi): Secondary | ICD-10-CM

## 2021-11-26 DIAGNOSIS — Z8719 Personal history of other diseases of the digestive system: Secondary | ICD-10-CM

## 2021-11-26 DIAGNOSIS — Z8679 Personal history of other diseases of the circulatory system: Secondary | ICD-10-CM

## 2021-11-26 DIAGNOSIS — Z8709 Personal history of other diseases of the respiratory system: Secondary | ICD-10-CM

## 2021-11-26 DIAGNOSIS — Z79899 Other long term (current) drug therapy: Secondary | ICD-10-CM

## 2021-11-26 DIAGNOSIS — Z86718 Personal history of other venous thrombosis and embolism: Secondary | ICD-10-CM

## 2021-11-26 DIAGNOSIS — M19041 Primary osteoarthritis, right hand: Secondary | ICD-10-CM

## 2021-11-26 DIAGNOSIS — M0579 Rheumatoid arthritis with rheumatoid factor of multiple sites without organ or systems involvement: Secondary | ICD-10-CM

## 2021-11-26 DIAGNOSIS — M810 Age-related osteoporosis without current pathological fracture: Secondary | ICD-10-CM

## 2021-11-26 DIAGNOSIS — M170 Bilateral primary osteoarthritis of knee: Secondary | ICD-10-CM

## 2021-11-26 DIAGNOSIS — M19071 Primary osteoarthritis, right ankle and foot: Secondary | ICD-10-CM

## 2021-11-26 DIAGNOSIS — M549 Dorsalgia, unspecified: Secondary | ICD-10-CM

## 2021-11-26 DIAGNOSIS — Z8669 Personal history of other diseases of the nervous system and sense organs: Secondary | ICD-10-CM

## 2021-11-26 DIAGNOSIS — Z8546 Personal history of malignant neoplasm of prostate: Secondary | ICD-10-CM

## 2021-11-26 DIAGNOSIS — Z86010 Personal history of colonic polyps: Secondary | ICD-10-CM

## 2021-11-28 ENCOUNTER — Telehealth: Payer: Self-pay

## 2021-11-28 DIAGNOSIS — I50812 Chronic right heart failure: Secondary | ICD-10-CM | POA: Diagnosis not present

## 2021-11-28 DIAGNOSIS — K219 Gastro-esophageal reflux disease without esophagitis: Secondary | ICD-10-CM | POA: Diagnosis not present

## 2021-11-28 DIAGNOSIS — J449 Chronic obstructive pulmonary disease, unspecified: Secondary | ICD-10-CM | POA: Diagnosis not present

## 2021-11-28 DIAGNOSIS — M549 Dorsalgia, unspecified: Secondary | ICD-10-CM | POA: Diagnosis not present

## 2021-11-28 NOTE — Telephone Encounter (Signed)
Spoke with patient and advised we received a letter from the dentist but it had the incorrect birthday on it. Patient advised we did fax it back to the dentist asking for it to be corrected but it has not come back yet. Patient states he will call the dentist and ask them to correct it.

## 2021-11-28 NOTE — Telephone Encounter (Signed)
Patient called to confirm the office received clearance letter from his dentist so he can schedule his Actemra infusion.

## 2021-11-29 NOTE — Progress Notes (Signed)
Office Visit Note  Patient: [name]             Date of Birth: [birth year]           MRN: [id number]             PCP: [name], MD Referring: [name], MD Visit Date: 11/30/2021 Occupation: @GUAROCC @  Subjective:  Discussed restarting Actemra and Arava  History of Present Illness: [name] is a 70 y.o. male with history of seropositive rheumatoid arthritis, osteoarthritis, and gout.  Patient is prescribed Actemra 4 mg/kg IV infusions every 4 weeks and Arava 20 mg 1 tablet daily.  His last Actemra infusion was on 09/18/2021.  He had a tooth extraction for an abscessed tooth on 11/03/2021.  We received clearance from his dentist on 11/28/2021 to restart on Actemra and [place].  Patient reports that while off of therapy has been having recurrent flares in the right hand and wrist.  The most recent flare was 2 days ago at which time he had difficulty making a complete fist.  He has been taking Tylenol arthritis for pain relief.  He denies any recent gout flares.  He remains on allopurinol as prescribed.  He states that he continues to have some neck pain and stiffness but denies any symptoms of radiculopathy.  He states that his thoracic spinal pain has improved since going to physical therapy.   Activities of Daily Living:  Patient reports morning stiffness for 0  none .   Patient Denies nocturnal pain.  Difficulty dressing/grooming: Denies Difficulty climbing stairs: Reports Difficulty getting out of chair: Denies Difficulty using hands for taps, buttons, cutlery, and/or writing: Denies  Review of Systems  Constitutional:  Negative for fatigue and night sweats.  HENT:  Positive for mouth dryness. Negative for mouth sores and nose dryness.   Eyes:  Negative for redness and dryness.  Respiratory:  Negative for difficulty breathing.   Cardiovascular:  Positive for swelling in legs/feet. Negative for chest pain, palpitations, hypertension and irregular heartbeat.   Gastrointestinal:  Positive for constipation and diarrhea.  Genitourinary:  Negative for difficulty urinating and painful urination.  Musculoskeletal:  Positive for joint pain, joint pain and joint swelling. Negative for myalgias, muscle weakness, morning stiffness, muscle tenderness and myalgias.  Skin:  Negative for color change, rash, hair loss, nodules/bumps, skin tightness, ulcers and sensitivity to sunlight.  Allergic/Immunologic: Negative for susceptible to infections.  Neurological:  Negative for dizziness, fainting, numbness, memory loss, night sweats and weakness.  Hematological:  Positive for bruising/bleeding tendency. Negative for swollen glands.  Psychiatric/Behavioral:  Negative for depressed mood and sleep disturbance. The patient is not nervous/anxious.    PMFS History:  Patient Active Problem List   Diagnosis Date Noted   Bronchiectasis without complication ([place]) 24/08/7352   Incontinence when straining, male 03/20/2021   Erectile dysfunction after radical prostatectomy 09/08/2020   OAB (overactive bladder) 02/23/2020   Class 2 severe obesity due to excess calories with serious comorbidity and body mass index (BMI) of 35.0 to 35.9 in adult [facility])    Chronic diastolic HF (heart failure) ([place])    Serratia sepsis ([place]) 06/08/2019   Bacteremia due to Gram-negative bacteria 06/08/2019   Voice hoarseness 04/26/2019   OSA (obstructive sleep apnea) 03/10/2019   Abnormal CT of the chest 02/09/2019   Dyspnea on exertion 10/22/2017   History of seizure disorder 02/27/2017   Primary osteoarthritis of both hands 02/27/2017   Primary osteoarthritis of both feet 02/27/2017   Idiopathic  gout of multiple sites 02/27/2017   High risk medication use 12/29/2016   History of prostate cancer 12/29/2016   Primary osteoarthritis of both knees 12/29/2016   Chronic idiopathic gout involving toe without tophus 12/29/2016   History of CHF (congestive heart failure) 12/29/2016   History of  COPD 12/29/2016   Plantar pustular psoriasis 12/29/2016   DVT (deep venous thrombosis) ([place]) 10/31/2016   COPD with acute exacerbation ([place]) 10/31/2016   CHF (congestive heart failure) ([place]) 10/31/2016   Prostate cancer ([place]) 08/30/2015   Malignant neoplasm of prostate ([place]) 07/04/2015   Chest pain at rest 07/05/2014   Weakness 21/19/4174   Complicated postphlebitic syndrome 11/16/2013   Personal history of DVT (deep vein thrombosis) 05/18/2013   Chronic anticoagulation 05/18/2013   Diverticulosis of colon without hemorrhage 05/18/2013   Rheumatoid arthritis ([place]) 05/18/2013   Seizure disorder ([place]) 05/18/2013   Hx of adenomatous colonic polyps 05/18/2013   GERD 05/08/2010   NAUSEA 05/08/2010   FLATULENCE-GAS-BLOATING 05/08/2010    Past Medical History:  Diagnosis Date   Arthritis    RA   Asthma    CHF (congestive heart failure) ([place])    pt denies    Clotting disorder ([place])    DVT both legs    COPD (chronic obstructive pulmonary disease) ([place])    DDD (degenerative disc disease), cervical    with UE's paresthesias   Diverticulosis    DVT, lower extremity ([place])    bilat   Eye abnormality    right eye drifts has difficulty focusing with right eye has had since birth    GERD (gastroesophageal reflux disease)    Gout    History of measles    History of shingles    Hypercholesterolemia    IBS (irritable bowel syndrome)    IBS (irritable bowel syndrome)    Peripheral edema    Pneumonia    hx of    Prostate cancer ([place])    Rheumatoid arthritis ([place])    Seizures ([place])    last seizure 20 years ago; on dilantin. Unknown origin.   Shortness of breath dyspnea    exertion    Sleep apnea    cpap    Tubular adenoma of colon 04/2008    Family History  Problem Relation Age of Onset   Skin cancer Father    Stomach cancer Father    Heart attack Father    Alzheimer's disease Mother    Throat cancer Brother    Stomach cancer Brother    Lung cancer Brother    Heart attack  Brother    Heart attack Brother    Breast cancer Sister    Heart attack Sister    Stroke Sister    Bladder Cancer Sister    Heart murmur Sister    Colon cancer Brother    Colon polyps Neg Hx    Past Surgical History:  Procedure Laterality Date   CHOLECYSTECTOMY     CIRCUMCISION     COLONOSCOPY     CYSTOSCOPY WITH LITHOLAPAXY N/A 03/17/2019   Procedure: CYSTOSCOPY WITH LITHOLAPAXY AND REMOVAL OF FOREIGN BODY;  Surgeon: [name], MD;  Location: [facility];  Service: Urology;  Laterality: N/A;   DOPPLER ECHOCARDIOGRAPHY N/A 01-21-2012   TECHNICALLY DIFFICULT. MILD CONCENTRIC LV HYPERTROPHY. LV CAVITY IS SMALL.. EF=> 55%. TRANSMITRAL SPECTRAL FLOW PATTREN IS SUGGESTIVE OF IMPAIRED LV RELAXATION. RV SYSTOLIC PRESSURE IS 08XKGY. LEFT ATRIAL SIZE IS NORMAL. AV APPEARS MILDLY SCLEROTIC. NO SIGN VALVE DISEASE NOTED.   LYMPHADENECTOMY Bilateral  08/30/2015   Procedure: PELVIC LYMPHADENECTOMY;  Surgeon: [name], MD;  Location: [facility];  Service: Urology;  Laterality: Bilateral;   NUCLEAR STRESS TEST N/A 01-21-2012   NORMAL PATTERN OF PERFUSION IN ALL REGIONS. POST STRESS LV SIZE IS NORMAL. NO EVIDENCE OF INDUCIBLE ISCHEMIA. EF 54%.   POLYPECTOMY     PROSTATE BIOPSY     ROBOT ASSISTED LAPAROSCOPIC RADICAL PROSTATECTOMY N/A 08/30/2015   Procedure: ROBOTIC ASSISTED LAPAROSCOPIC RADICAL PROSTATECTOMY;  Surgeon: [name], MD;  Location: [facility];  Service: Urology;  Laterality: N/A;   URINARY SPHINCTER IMPLANT N/A 03/20/2021   Procedure: ARTIFICIAL URINARY SPHINCTER CYSTOSCOPY;  Surgeon: Bjorn Loser, MD;  Location: [facility];  Service: Urology;  Laterality: N/A;  REQUESTING 90 MINS   US VENOUS LOWER EXT Right 03/07/11   PERSISTENT DVT IN RIGHT LOWER EXT.WITH PERSISTANT VISUALIZATION OF HYPOECHOIC THROMBUS WITHIN THE FEMORAL, PROFUNDA FEMORAL AND POPLITEAL VEINS. WHEN COMPARED TO PREVIOUS, CLOT IS NO LONGER IDENTIFIED WITH IN THE RIGHT CFV.   Social History   Social History  Narrative   Not on file   Immunization History  Administered Date(s) Administered   Fluad Quad(high Dose 65+) 09/30/2019   Influenza, High Dose Seasonal PF 10/05/2018   Influenza,inj,Quad PF,6+ Mos 08/31/2015   Influenza-Unspecified 09/30/2016, 08/22/2017   Moderna Sars-Covid-2 Vaccination 10/25/2020   Pneumococcal Conjugate-13 10/05/2018   Pneumococcal-Unspecified 12/30/2012     Objective: Vital Signs: BP (!) 165/67 (BP Location: Left Arm, Patient Position: Sitting, Cuff Size: Normal)   Pulse 73   Resp 16   Ht 5\' 8"  (1.727 m)   Wt 229 lb (103.9 kg)   BMI 34.82 kg/m    Physical Exam Vitals and nursing note reviewed.  Constitutional:      Appearance: He is well-developed.  HENT:     Head: Normocephalic and atraumatic.  Eyes:     Conjunctiva/sclera: Conjunctivae normal.     Pupils: Pupils are equal, round, and reactive to light.  Pulmonary:     Effort: Pulmonary effort is normal.  Abdominal:     Palpations: Abdomen is soft.  Musculoskeletal:     Cervical back: Normal range of motion and neck supple.  Skin:    General: Skin is warm and dry.     Capillary Refill: Capillary refill takes less than 2 seconds.  Neurological:     Mental Status: He is alert and oriented to person, place, and time.  Psychiatric:        Behavior: Behavior normal.     Musculoskeletal Exam: C-spine has limited range of motion with lateral rotation.  Some midline spinal tenderness over the C-spine and thoracic spine.  No SI joint tenderness.  Postural thoracic kyphosis noted.  Shoulder joints and elbow joints have good range of motion with no discomfort.  Painful flexion of the right wrist with tenderness on the volar aspect.  Warmth of the right wrist noted.  No tenderness or synovitis over MCP joints.  PIP and DIP thickening consistent with osteoarthritis of both hands.  Hip joints have good range of motion with no groin pain.  Knee joints have good range of motion with no warmth or effusion.  Ankle  joints have good range of motion with no joint tenderness.  Pedal edema noted bilaterally.  CDAI Exam: CDAI Score: 1  Patient Global: 5 mm; Provider Global: 5 mm Swollen: 0 ; Tender: 0  Joint Exam 11/30/2021   No joint exam has been documented for this visit   There is currently no information documented  on the homunculus. Go to the Rheumatology activity and complete the homunculus joint exam.  Investigation: No additional findings.  Imaging: No results found.  Recent Labs: Lab Results  Component Value Date   WBC 5.0 07/31/2021   HGB 14.5 07/31/2021   PLT 160 07/31/2021   NA 138 07/31/2021   K 4.1 07/31/2021   CL 100 07/31/2021   CO2 29 07/31/2021   GLUCOSE 120 (H) 07/31/2021   BUN 15 07/31/2021   CREATININE 1.46 (H) 07/31/2021   BILITOT 0.6 07/31/2021   ALKPHOS 56 07/31/2021   AST 33 07/31/2021   ALT 29 07/31/2021   PROT 6.4 (L) 07/31/2021   ALBUMIN 3.9 07/31/2021   CALCIUM 9.5 07/31/2021   GFRAA >60 09/20/2020   QFTBGOLDPLUS Negative 02/01/2021    Speciality Comments: ACTEMRA 4mg /kg x 4 weeks PPD negative 01/02/17  Procedures:  No procedures performed Allergies: Orencia [abatacept], Carbamazepine, Celecoxib, Cephalexin, Enbrel [etanercept], Humira [adalimumab], Levofloxacin, Sulfa antibiotics, and Sulfasalazine   Assessment / Plan:     Visit Diagnoses: Rheumatoid arthritis involving multiple sites with positive rheumatoid factor ([place]): He has some warmth and tenderness over the volar aspect of the right wrist on examination.  He had a flare 2 days ago consistent with flexor tenosynovitis of the right wrist.  He was having difficulty flexing his right wrist and making a complete fist at that time.  He has been experiencing recurrent flares in his hands about twice a month since being off of Actemra and [place].  His last Actemra infusion was on 09/18/2021.  He has been holding both medications due to having an abscessed tooth which was extracted on 11/03/2021.  He has  been cleared by his dentist and we received a letter of clearance on 11/28/2021 to resume his medications as prescribed.  He was advised to restart on arrival today.  He will be having his next Actemra infusion 1 week from today when he returns from vacation.  CBC and CMP were updated today prior to his next infusion. He was advised to notify us if he continues to have recurrent flares after restarting on therapy.  He will follow-up in the office in 2 months.  High risk medication use - Actemra 4mg /kg IV infusions every 4 weeks and Arava 20 mg 1 tablet by mouth daily.  His last Actemra infusion was on 09/18/2021.  CBC and CMP were drawn on 07/31/2021.  He is due to update lab work today.  Orders for CBC and CMP were released.  He will continue to have lab work with his infusions going forward.  TB Gold negative on 02/01/2021.  Future order for TB gold is in place.- Plan: CBC with Differential/Platelet, COMPLETE METABOLIC PANEL WITH GFR He has been holding Actemra and Arava due to an abscessed tooth which was extracted on 11/03/2021.  He has been cleared by his dentist to resume therapy as prescribed. Discussed the importance of holding Actemra and Arava anytime he develops signs or symptoms of an infection and to resume only once the infection has completely cleared.  He voiced understanding.  History of abscessed tooth:  11/03/21-Clearance received on 10/3021 to resume arava and actemra.     Chronic idiopathic gout involving toe without tophus, unspecified laterality - He remains on allopurinol 100 mg 2 tablets daily for management of gout.  He has not had any signs or symptoms of a gout flare.  uric acid: 07/03/2021 5.7.  His uric acid was rechecked today.  He was advised to notify us if  he develops signs or symptoms of a gout flare. - Plan: Uric acid  Primary osteoarthritis of both hands: He has PIP and DIP thickening consistent with OA of both hands.  CMC joint thickening bilaterally.  Discussed the importance  of joint protection and muscle strengthening.   Primary osteoarthritis of both knees: He has good ROM of both knee joints with no discomfort.  No warmth or effusion noted.   Primary osteoarthritis of both feet: He is not experiencing any discomfort in his feet at this time. He is wearing proper fitting shoes.  Age-related osteoporosis without current pathological fracture - DEXA on 09/28/2019 T-score -2.1 right femur neck with +12 % change in BMD. He is in taking Fosamax 70 mg 1 tablet by mouth once weekly since October 2018. No recent falls or fractures. Due to update DEXA.  Order remains in place.   Neck pain: He has been experiencing increased neck pain and stiffness.  He has some limited range of motion especially with lateral rotation.  No symptoms of radiculopathy.  He was given a handout of neck exercises to perform.  Advised the patient to notify us if his symptoms persist or worsen.   Mid back pain on right side: X-rays of the thoracic spine were updated on 10/10/2021 which were consistent with degenerative disc disease of the thoracic spine and possible early DISH disease.  He has noticed improvement since going to physical therapy.  Advised to patient to continue home exercises.  He was advised to notify us if his symptoms persist or worsen.    Other medical conditions are listed as follows:  History of seizure disorder  History of COPD  History of prostate cancer  History of adenomatous polyp of colon  History of DVT (deep vein thrombosis): He has not a good candidate for the use of Jak inhibitors.  History of CHF (congestive heart failure): He is not a good candidate for TNF inhibitors.  Orders: Orders Placed This Encounter  Procedures   CBC with Differential/Platelet   COMPLETE METABOLIC PANEL WITH GFR   Uric acid   No orders of the defined types were placed in this encounter.    Follow-Up Instructions: Return in 2 months (on 01/31/2022) for Rheumatoid arthritis, Gout,  Osteoarthritis, Osteoporosis.   Ofilia Neas, PA-C  Note - This record has been created using Dragon software.  Chart creation errors have been sought, but may not always  have been located. Such creation errors do not reflect on  the standard of medical care.

## 2021-11-30 ENCOUNTER — Other Ambulatory Visit: Payer: Self-pay

## 2021-11-30 ENCOUNTER — Encounter: Payer: Self-pay | Admitting: Physician Assistant

## 2021-11-30 ENCOUNTER — Ambulatory Visit (INDEPENDENT_AMBULATORY_CARE_PROVIDER_SITE_OTHER): Payer: Medicare Other | Admitting: Physician Assistant

## 2021-11-30 VITALS — BP 165/67 | HR 73 | Resp 16 | Ht 68.0 in | Wt 229.0 lb

## 2021-11-30 DIAGNOSIS — M549 Dorsalgia, unspecified: Secondary | ICD-10-CM | POA: Diagnosis not present

## 2021-11-30 DIAGNOSIS — Z8719 Personal history of other diseases of the digestive system: Secondary | ICD-10-CM | POA: Diagnosis not present

## 2021-11-30 DIAGNOSIS — M19041 Primary osteoarthritis, right hand: Secondary | ICD-10-CM | POA: Diagnosis not present

## 2021-11-30 DIAGNOSIS — Z86718 Personal history of other venous thrombosis and embolism: Secondary | ICD-10-CM

## 2021-11-30 DIAGNOSIS — M170 Bilateral primary osteoarthritis of knee: Secondary | ICD-10-CM | POA: Diagnosis not present

## 2021-11-30 DIAGNOSIS — Z86010 Personal history of colonic polyps: Secondary | ICD-10-CM

## 2021-11-30 DIAGNOSIS — Z79899 Other long term (current) drug therapy: Secondary | ICD-10-CM

## 2021-11-30 DIAGNOSIS — M0579 Rheumatoid arthritis with rheumatoid factor of multiple sites without organ or systems involvement: Secondary | ICD-10-CM | POA: Diagnosis not present

## 2021-11-30 DIAGNOSIS — Z8546 Personal history of malignant neoplasm of prostate: Secondary | ICD-10-CM

## 2021-11-30 DIAGNOSIS — M19071 Primary osteoarthritis, right ankle and foot: Secondary | ICD-10-CM

## 2021-11-30 DIAGNOSIS — Z8709 Personal history of other diseases of the respiratory system: Secondary | ICD-10-CM | POA: Diagnosis not present

## 2021-11-30 DIAGNOSIS — Z8669 Personal history of other diseases of the nervous system and sense organs: Secondary | ICD-10-CM

## 2021-11-30 DIAGNOSIS — M810 Age-related osteoporosis without current pathological fracture: Secondary | ICD-10-CM | POA: Diagnosis not present

## 2021-11-30 DIAGNOSIS — M1A0790 Idiopathic chronic gout, unspecified ankle and foot, without tophus (tophi): Secondary | ICD-10-CM

## 2021-11-30 DIAGNOSIS — M19072 Primary osteoarthritis, left ankle and foot: Secondary | ICD-10-CM

## 2021-11-30 DIAGNOSIS — M542 Cervicalgia: Secondary | ICD-10-CM

## 2021-11-30 DIAGNOSIS — M19042 Primary osteoarthritis, left hand: Secondary | ICD-10-CM

## 2021-11-30 DIAGNOSIS — Z8679 Personal history of other diseases of the circulatory system: Secondary | ICD-10-CM

## 2021-11-30 NOTE — Patient Instructions (Signed)
Neck Exercises Ask your health care provider which exercises are safe for you. Do exercises exactly as told by your health care provider and adjust them as directed. It is normal to feel mild stretching, pulling, tightness, or discomfort as you do these exercises. Stop right away if you feel sudden pain or your pain gets worse. Do not begin these exercises until told by your health care provider. Neck exercises can be important for many reasons. They can improve strength and maintain flexibility in your neck, which will help your upper back and prevent neck pain. Stretching exercises Rotation neck stretching  Sit in a chair or stand up. Place your feet flat on the floor, shoulder-width apart. Slowly turn your head (rotate) to the right until a slight stretch is felt. Turn it all the way to the right so you can look over your right shoulder. Do not tilt or tip your head. Hold this position for 10-30 seconds. Slowly turn your head (rotate) to the left until a slight stretch is felt. Turn it all the way to the left so you can look over your left shoulder. Do not tilt or tip your head. Hold this position for 10-30 seconds. Repeat __________ times. Complete this exercise __________ times a day. Neck retraction  Sit in a sturdy chair or stand up. Look straight ahead. Do not bend your neck. Use your fingers to push your chin backward (retraction). Do not bend your neck for this movement. Continue to face straight ahead. If you are doing the exercise properly, you will feel a slight sensation in your throat and a stretch at the back of your neck. Hold the stretch for 1-2 seconds. Repeat __________ times. Complete this exercise __________ times a day. Strengthening exercises Neck press  Lie on your back on a firm bed or on the floor with a pillow under your head. Use your neck muscles to push your head down on the pillow and straighten your spine. Hold the position as well as you can. Keep your head  facing up (in a neutral position) and your chin tucked. Slowly count to 5 while holding this position. Repeat __________ times. Complete this exercise __________ times a day. Isometrics These are exercises in which you strengthen the muscles in your neck while keeping your neck still (isometrics). Sit in a supportive chair and place your hand on your forehead. Keep your head and face facing straight ahead. Do not flex or extend your neck while doing isometrics. Push forward with your head and neck while pushing back with your hand. Hold for 10 seconds. Do the sequence again, this time putting your hand against the back of your head. Use your head and neck to push backward against the hand pressure. Finally, do the same exercise on either side of your head, pushing sideways against the pressure of your hand. Repeat __________ times. Complete this exercise __________ times a day. Prone head lifts  Lie face-down (prone position), resting on your elbows so that your chest and upper back are raised. Start with your head facing downward, near your chest. Position your chin either on or near your chest. Slowly lift your head upward. Lift until you are looking straight ahead. Then continue lifting your head as far back as you can comfortably stretch. Hold your head up for 5 seconds. Then slowly lower it to your starting position. Repeat __________ times. Complete this exercise __________ times a day. Supine head lifts  Lie on your back (supine position), bending your knees   to point to the ceiling and keeping your feet flat on the floor. Lift your head slowly off the floor, raising your chin toward your chest. Hold for 5 seconds. Repeat __________ times. Complete this exercise __________ times a day. Scapular retraction  Stand with your arms at your sides. Look straight ahead. Slowly pull both shoulders (scapulae) backward and downward (retraction) until you feel a stretch between your shoulder  blades in your upper back. Hold for 10-30 seconds. Relax and repeat. Repeat __________ times. Complete this exercise __________ times a day. Contact a health care provider if: Your neck pain or discomfort gets worse when you do an exercise. Your neck pain or discomfort does not improve within 2 hours after you exercise. If you have any of these problems, stop exercising right away. Do not do the exercises again unless your health care provider says that you can. Get help right away if: You develop sudden, severe neck pain. If this happens, stop exercising right away. Do not do the exercises again unless your health care provider says that you can. This information is not intended to replace advice given to you by your health care provider. Make sure you discuss any questions you have with your health care provider. Document Revised: 06/12/2021 Document Reviewed: 06/12/2021 Elsevier Patient Education  2022 [employer] Inc.  

## 2021-12-01 LAB — CBC WITH DIFFERENTIAL/PLATELET
Absolute Monocytes: 1066 cells/uL — ABNORMAL HIGH (ref 200–950)
Basophils Absolute: 69 cells/uL (ref 0–200)
Basophils Relative: 0.8 %
Eosinophils Absolute: 654 cells/uL — ABNORMAL HIGH (ref 15–500)
Eosinophils Relative: 7.6 %
HCT: 45.4 % (ref 38.5–50.0)
Hemoglobin: 15.3 g/dL (ref 13.2–17.1)
Lymphs Abs: 1144 cells/uL (ref 850–3900)
MCH: 30.2 pg (ref 27.0–33.0)
MCHC: 33.7 g/dL (ref 32.0–36.0)
MCV: 89.7 fL (ref 80.0–100.0)
MPV: 11.1 fL (ref 7.5–12.5)
Monocytes Relative: 12.4 %
Neutro Abs: 5667 cells/uL (ref 1500–7800)
Neutrophils Relative %: 65.9 %
Platelets: 219 10*3/uL (ref 140–400)
RBC: 5.06 10*6/uL (ref 4.20–5.80)
RDW: 13.2 % (ref 11.0–15.0)
Total Lymphocyte: 13.3 %
WBC: 8.6 10*3/uL (ref 3.8–10.8)

## 2021-12-01 LAB — COMPLETE METABOLIC PANEL WITH GFR
AG Ratio: 1.4 (calc) (ref 1.0–2.5)
ALT: 25 U/L (ref 9–46)
AST: 23 U/L (ref 10–35)
Albumin: 4 g/dL (ref 3.6–5.1)
Alkaline phosphatase (APISO): 100 U/L (ref 35–144)
BUN: 12 mg/dL (ref 7–25)
CO2: 30 mmol/L (ref 20–32)
Calcium: 9.4 mg/dL (ref 8.6–10.3)
Chloride: 102 mmol/L (ref 98–110)
Creat: 1.2 mg/dL (ref 0.70–1.28)
Globulin: 2.8 g/dL (calc) (ref 1.9–3.7)
Glucose, Bld: 111 mg/dL — ABNORMAL HIGH (ref 65–99)
Potassium: 4.5 mmol/L (ref 3.5–5.3)
Sodium: 142 mmol/L (ref 135–146)
Total Bilirubin: 0.4 mg/dL (ref 0.2–1.2)
Total Protein: 6.8 g/dL (ref 6.1–8.1)
eGFR: 65 mL/min/{1.73_m2} (ref >=60)

## 2021-12-01 LAB — URIC ACID: Uric Acid, Serum: 5.9 mg/dL (ref 4.0–8.0)

## 2021-12-03 NOTE — Progress Notes (Signed)
CBC stable. Glucose is 111. Rest of CMP WNL.  Uric acid is WNL.

## 2021-12-07 ENCOUNTER — Ambulatory Visit (HOSPITAL_COMMUNITY)
Admission: RE | Admit: 2021-12-07 | Discharge: 2021-12-07 | Disposition: A | Discharge status: Home / Self Care | Payer: Medicare Other | Source: Ambulatory Visit | Attending: Rheumatology | Admitting: Rheumatology

## 2021-12-07 ENCOUNTER — Other Ambulatory Visit: Payer: Self-pay

## 2021-12-07 DIAGNOSIS — M0579 Rheumatoid arthritis with rheumatoid factor of multiple sites without organ or systems involvement: Secondary | ICD-10-CM | POA: Diagnosis not present

## 2021-12-07 MED ORDER — ACETAMINOPHEN 325 MG PO TABS: 650.0000 mg | ORAL_TABLET | ORAL | Status: DC

## 2021-12-07 MED ORDER — TOCILIZUMAB 400 MG/20ML IV SOLN
4.0000 mg/kg | INTRAVENOUS | Status: DC
  Administered 2021-12-07: 418 mg via INTRAVENOUS
  Filled 2021-12-07: qty 20

## 2021-12-07 MED ORDER — DIPHENHYDRAMINE HCL 25 MG PO CAPS: 25.0000 mg | ORAL_CAPSULE | ORAL | Status: DC

## 2021-12-13 ENCOUNTER — Other Ambulatory Visit: Payer: Self-pay | Admitting: Cardiovascular Disease

## 2021-12-26 DIAGNOSIS — N3946 Mixed incontinence: Secondary | ICD-10-CM | POA: Diagnosis not present

## 2021-12-28 ENCOUNTER — Other Ambulatory Visit: Payer: Self-pay | Admitting: Physician Assistant

## 2021-12-28 NOTE — Telephone Encounter (Signed)
Next Visit: 02/21/2022  Last Visit: 11/30/2021  Last Fill: 10/02/2021   DX: Age-related osteoporosis without current pathological fracture   Current Dose per office note 11/30/2021: Fosamax 70 mg 1 tablet by mouth once weekly  Labs: 11/30/2021 CBC stable. Glucose is 111. Rest of CMP WNL.  Uric acid is WNL.   Okay to refill Fosamax?

## 2022-01-02 NOTE — Telephone Encounter (Signed)
Received fax from [facility] with correct birthdate stating that procedure on 10/30/21 was uneventful.  Sending to scan center  Knox Saliva, PharmD, MPH, BCPS Clinical Pharmacist (Rheumatology and Pulmonology)

## 2022-01-03 ENCOUNTER — Other Ambulatory Visit: Payer: Self-pay | Admitting: Physician Assistant

## 2022-01-03 ENCOUNTER — Other Ambulatory Visit (HOSPITAL_COMMUNITY): Payer: Self-pay | Admitting: *Deleted

## 2022-01-03 NOTE — Telephone Encounter (Signed)
Next Visit: 02/21/2022  Last Visit: 11/30/2021  Last Fill: 10/02/2021  DX: Rheumatoid arthritis involving multiple sites with positive rheumatoid factor Chronic idiopathic gout involving toe without tophus  Current Dose per office note 11/30/2021: Arava 20 mg 1 tablet by mouth daily. allopurinol 100 mg 2 tablets daily for management of gout  Labs: 11/30/2021 CBC stable. Glucose is 111. Rest of CMP WNL.  Uric acid is WNL.    Okay to refill Arava and Allopurinol?

## 2022-01-04 ENCOUNTER — Ambulatory Visit (HOSPITAL_COMMUNITY)
Admission: RE | Admit: 2022-01-04 | Discharge: 2022-01-04 | Disposition: A | Discharge status: Home / Self Care | Payer: Medicare Other | Source: Ambulatory Visit | Attending: Rheumatology | Admitting: Rheumatology

## 2022-01-04 ENCOUNTER — Other Ambulatory Visit: Payer: Self-pay

## 2022-01-04 DIAGNOSIS — M0579 Rheumatoid arthritis with rheumatoid factor of multiple sites without organ or systems involvement: Secondary | ICD-10-CM | POA: Diagnosis not present

## 2022-01-04 MED ORDER — TOCILIZUMAB 400 MG/20ML IV SOLN
4.0000 mg/kg | INTRAVENOUS | Status: DC
  Administered 2022-01-04: 418 mg via INTRAVENOUS
  Filled 2022-01-04: qty 20.9

## 2022-01-04 MED ORDER — DIPHENHYDRAMINE HCL 25 MG PO CAPS: 25.0000 mg | ORAL_CAPSULE | Freq: Once | ORAL | Status: DC

## 2022-01-04 MED ORDER — ACETAMINOPHEN 325 MG PO TABS: 650.0000 mg | ORAL_TABLET | Freq: Once | ORAL | Status: DC

## 2022-01-20 ENCOUNTER — Other Ambulatory Visit: Payer: Self-pay | Admitting: Cardiovascular Disease

## 2022-01-31 NOTE — Addendum Note (Signed)
Addended by: Cassandria Anger on: 01/31/2022 02:46 PM   Modules accepted: Orders

## 2022-02-01 ENCOUNTER — Ambulatory Visit (HOSPITAL_COMMUNITY)
Admission: RE | Admit: 2022-02-01 | Discharge: 2022-02-01 | Disposition: A | Discharge status: Home / Self Care | Payer: Medicare Other | Source: Ambulatory Visit | Attending: Rheumatology | Admitting: Rheumatology

## 2022-02-01 ENCOUNTER — Other Ambulatory Visit: Payer: Self-pay

## 2022-02-01 ENCOUNTER — Other Ambulatory Visit: Payer: Self-pay | Admitting: Pharmacist

## 2022-02-01 DIAGNOSIS — Z79899 Other long term (current) drug therapy: Secondary | ICD-10-CM | POA: Insufficient documentation

## 2022-02-01 DIAGNOSIS — Z111 Encounter for screening for respiratory tuberculosis: Secondary | ICD-10-CM | POA: Diagnosis not present

## 2022-02-01 DIAGNOSIS — M0579 Rheumatoid arthritis with rheumatoid factor of multiple sites without organ or systems involvement: Secondary | ICD-10-CM

## 2022-02-01 LAB — LIPID PANEL
Cholesterol: 202 mg/dL — ABNORMAL HIGH (ref 0–200)
HDL: 64 mg/dL (ref >40)
LDL Cholesterol: 107 mg/dL — ABNORMAL HIGH (ref 0–99)
Total CHOL/HDL Ratio: 3.2 RATIO
Triglycerides: 156 mg/dL — ABNORMAL HIGH (ref <150)
VLDL: 31 mg/dL (ref 0–40)

## 2022-02-01 LAB — COMPREHENSIVE METABOLIC PANEL
ALT: 27 U/L (ref 0–44)
AST: 37 U/L (ref 15–41)
Albumin: 4 g/dL (ref 3.5–5.0)
Alkaline Phosphatase: 64 U/L (ref 38–126)
Anion gap: 9 (ref 5–15)
BUN: 11 mg/dL (ref 8–23)
CO2: 29 mmol/L (ref 22–32)
Calcium: 9 mg/dL (ref 8.9–10.3)
Chloride: 101 mmol/L (ref 98–111)
Creatinine, Ser: 1.36 mg/dL — ABNORMAL HIGH (ref 0.61–1.24)
GFR, Estimated: 56 mL/min — ABNORMAL LOW (ref >60)
Glucose, Bld: 127 mg/dL — ABNORMAL HIGH (ref 70–99)
Potassium: 4.1 mmol/L (ref 3.5–5.1)
Sodium: 139 mmol/L (ref 135–145)
Total Bilirubin: 1.1 mg/dL (ref 0.3–1.2)
Total Protein: 6.5 g/dL (ref 6.5–8.1)

## 2022-02-01 LAB — CBC WITH DIFFERENTIAL/PLATELET
Abs Immature Granulocytes: 0.02 10*3/uL (ref 0.00–0.07)
Basophils Absolute: 0.1 10*3/uL (ref 0.0–0.1)
Basophils Relative: 1 %
Eosinophils Absolute: 0.4 10*3/uL (ref 0.0–0.5)
Eosinophils Relative: 7 %
HCT: 45.9 % (ref 39.0–52.0)
Hemoglobin: 15.2 g/dL (ref 13.0–17.0)
Immature Granulocytes: 0 %
Lymphocytes Relative: 20 %
Lymphs Abs: 1.2 10*3/uL (ref 0.7–4.0)
MCH: 29.7 pg (ref 26.0–34.0)
MCHC: 33.1 g/dL (ref 30.0–36.0)
MCV: 89.8 fL (ref 80.0–100.0)
Monocytes Absolute: 0.7 10*3/uL (ref 0.1–1.0)
Monocytes Relative: 11 %
Neutro Abs: 3.6 10*3/uL (ref 1.7–7.7)
Neutrophils Relative %: 61 %
Platelets: 163 10*3/uL (ref 150–400)
RBC: 5.11 MIL/uL (ref 4.22–5.81)
RDW: 14.5 % (ref 11.5–15.5)
WBC: 6 10*3/uL (ref 4.0–10.5)
nRBC: 0 % (ref 0.0–0.2)

## 2022-02-01 MED ORDER — ACETAMINOPHEN 325 MG PO TABS: 650.0000 mg | ORAL_TABLET | Freq: Once | ORAL | Status: DC

## 2022-02-01 MED ORDER — DIPHENHYDRAMINE HCL 25 MG PO CAPS: 25.0000 mg | ORAL_CAPSULE | Freq: Once | ORAL | Status: DC

## 2022-02-01 MED ORDER — TOCILIZUMAB 400 MG/20ML IV SOLN
4.0000 mg/kg | INTRAVENOUS | Status: DC
  Administered 2022-02-01: 418 mg via INTRAVENOUS
  Filled 2022-02-01: qty 0.9

## 2022-02-01 NOTE — Progress Notes (Signed)
Next infusion scheduled for Actemra IV on 03/01/22 and due for updated orders. Diagnosis: RA  Dose: 4mg /kg every 28 days (418 mg based on last recorded weight of 104.3kg)  Last Clinic Visit: 11/30/21 Next Clinic Visit: 02/21/22  Last infusion: 02/01/22  Labs: creatinine elevated at 1.36, glucose elevated at 127, lipid panel showed improvement in TG. LDL elevated at 107  TB Gold: negative on 02/01/22  Orders placed for Actemra IV x 3 doses along with premedication of acetaminophen and diphenhydramine to be administered 30 minutes before medication infusion.  Standing CBC with diff/platelet and CMP with GFR orders placed to be drawn every 2 months.  Next TB gold and lipid panel due 02/01/23  Knox Saliva, PharmD, MPH, BCPS Clinical Pharmacist (Rheumatology and Pulmonology)

## 2022-02-01 NOTE — Progress Notes (Signed)
CBC with diff WNL

## 2022-02-01 NOTE — Progress Notes (Signed)
Cholesterol is borderline elevated-202.  TG are borderline elevated-156 but have improved.   LDL is borderline elevated-107.  Glucose is 127.  Creatinine is slightly elevated-1.36 and GFR is slightly low-56. Please advise the patient to remain hydrated and to avoid the use of NSAIDs.

## 2022-02-06 LAB — QUANTIFERON-TB GOLD PLUS: QuantiFERON-TB Gold Plus: NEGATIVE

## 2022-02-06 LAB — QUANTIFERON-TB GOLD PLUS (RQFGPL)
QuantiFERON Mitogen Value: >10 IU/mL
QuantiFERON Nil Value: 0.05 IU/mL
QuantiFERON TB1 Ag Value: 0.03 IU/mL
QuantiFERON TB2 Ag Value: 0.03 IU/mL

## 2022-02-06 NOTE — Progress Notes (Signed)
TB gold negative

## 2022-02-07 ENCOUNTER — Other Ambulatory Visit: Payer: Self-pay | Admitting: Physician Assistant

## 2022-02-07 ENCOUNTER — Other Ambulatory Visit: Payer: Self-pay | Admitting: Pulmonary Disease

## 2022-02-07 ENCOUNTER — Other Ambulatory Visit: Payer: Self-pay | Admitting: Cardiovascular Disease

## 2022-02-08 DIAGNOSIS — J40 Bronchitis, not specified as acute or chronic: Secondary | ICD-10-CM | POA: Diagnosis not present

## 2022-02-08 NOTE — Progress Notes (Signed)
Office Visit Note  Patient: [name]             Date of Birth: 09/13/1951           MRN: [id number]             PCP: [name], MD Referring: [name], MD Visit Date: 02/21/2022 Occupation: @GUAROCC @  Subjective:  Joint stiffness and medication management  History of Present Illness: [name] is a 71 y.o. male history of seropositive rheumatoid arthritis, osteoarthritis, gout and osteoporosis.  He states his rheumatoid arthritis is well controlled with Actemra infusions and leflunomide.  He experiences some discomfort right before Actemra infusion and then the symptoms get better.  He continues to have some stiffness in his hands knees and his feet.  He has not had any gout flares.  He has been taking alendronate on a weekly basis.  He takes allopurinol 100 mg p.o. twice daily.  He recent upper respiratory tract infection which is resolved now.  He has chronic cough due to COPD.    Activities of Daily Living:  Patient reports morning stiffness for 0 minutes.   Patient Denies nocturnal pain.  Difficulty dressing/grooming: Denies Difficulty climbing stairs: Denies Difficulty getting out of chair: Denies Difficulty using hands for taps, buttons, cutlery, and/or writing: Denies  Review of Systems  Constitutional:  Negative for fatigue.  HENT:  Positive for mouth dryness. Negative for mouth sores and nose dryness.   Eyes:  Negative for pain, itching and dryness.  Respiratory:  Positive for cough and shortness of breath. Negative for difficulty breathing.        COPD  Cardiovascular:  Negative for chest pain and palpitations.  Gastrointestinal:  Negative for blood in stool, constipation and diarrhea.  Endocrine: Negative for increased urination.  Genitourinary:  Negative for difficulty urinating.  Musculoskeletal:  Negative for joint pain, joint pain, joint swelling, myalgias, morning stiffness, muscle tenderness and myalgias.  Skin:  Negative for color  change, rash and redness.  Allergic/Immunologic: Positive for susceptible to infections.  Neurological:  Negative for dizziness, numbness, headaches, memory loss and weakness.  Hematological:  Positive for bruising/bleeding tendency.  Psychiatric/Behavioral:  Negative for confusion.    PMFS History:  Patient Active Problem List   Diagnosis Date Noted   Bronchiectasis without complication ([place]) 30/86/5784   Incontinence when straining, male 03/20/2021   Erectile dysfunction after radical prostatectomy 09/08/2020   OAB (overactive bladder) 02/23/2020   Class 2 severe obesity due to excess calories with serious comorbidity and body mass index (BMI) of 35.0 to 35.9 in adult [facility])    Chronic diastolic HF (heart failure) ([place])    Serratia sepsis ([place]) 06/08/2019   Bacteremia due to Gram-negative bacteria 06/08/2019   Voice hoarseness 04/26/2019   OSA (obstructive sleep apnea) 03/10/2019   Abnormal CT of the chest 02/09/2019   Dyspnea on exertion 10/22/2017   History of seizure disorder 02/27/2017   Primary osteoarthritis of both hands 02/27/2017   Primary osteoarthritis of both feet 02/27/2017   Idiopathic gout of multiple sites 02/27/2017   High risk medication use 12/29/2016   History of prostate cancer 12/29/2016   Primary osteoarthritis of both knees 12/29/2016   Chronic idiopathic gout involving toe without tophus 12/29/2016   History of CHF (congestive heart failure) 12/29/2016   History of COPD 12/29/2016   Plantar pustular psoriasis 12/29/2016   DVT (deep venous thrombosis) ([place]) 10/31/2016   COPD with acute exacerbation ([place]) 10/31/2016   CHF (congestive heart failure) (  [place]) 10/31/2016   Prostate cancer ([place]) 08/30/2015   Malignant neoplasm of prostate ([place]) 07/04/2015   Chest pain at rest 07/05/2014   Weakness 12/87/8676   Complicated postphlebitic syndrome 11/16/2013   Personal history of DVT (deep vein thrombosis) 05/18/2013   Chronic anticoagulation 05/18/2013    Diverticulosis of colon without hemorrhage 05/18/2013   Rheumatoid arthritis ([place]) 05/18/2013   Seizure disorder ([place]) 05/18/2013   Hx of adenomatous colonic polyps 05/18/2013   GERD 05/08/2010   NAUSEA 05/08/2010   FLATULENCE-GAS-BLOATING 05/08/2010    Past Medical History:  Diagnosis Date   Arthritis    RA   Asthma    CHF (congestive heart failure) ([place])    pt denies    Clotting disorder ([place])    DVT both legs    COPD (chronic obstructive pulmonary disease) ([place])    DDD (degenerative disc disease), cervical    with UE's paresthesias   Diverticulosis    DVT, lower extremity ([place])    bilat   Eye abnormality    right eye drifts has difficulty focusing with right eye has had since birth    GERD (gastroesophageal reflux disease)    Gout    History of measles    History of shingles    Hypercholesterolemia    IBS (irritable bowel syndrome)    IBS (irritable bowel syndrome)    Peripheral edema    Pneumonia    hx of    Prostate cancer ([place])    Rheumatoid arthritis ([place])    Seizures ([place])    last seizure 20 years ago; on dilantin. Unknown origin.   Shortness of breath dyspnea    exertion    Sleep apnea    cpap    Tubular adenoma of colon 04/2008    Family History  Problem Relation Age of Onset   Skin cancer Father    Stomach cancer Father    Heart attack Father    Alzheimer's disease Mother    Throat cancer Brother    Stomach cancer Brother    Lung cancer Brother    Heart attack Brother    Heart attack Brother    Breast cancer Sister    Heart attack Sister    Stroke Sister    Bladder Cancer Sister    Heart murmur Sister    Colon cancer Brother    Colon polyps Neg Hx    Past Surgical History:  Procedure Laterality Date   CHOLECYSTECTOMY     CIRCUMCISION     COLONOSCOPY     CYSTOSCOPY WITH LITHOLAPAXY N/A 03/17/2019   Procedure: CYSTOSCOPY WITH LITHOLAPAXY AND REMOVAL OF FOREIGN BODY;  Surgeon: [name], MD;  Location: [facility];  Service: Urology;   Laterality: N/A;   DOPPLER ECHOCARDIOGRAPHY N/A 01-21-2012   TECHNICALLY DIFFICULT. MILD CONCENTRIC LV HYPERTROPHY. LV CAVITY IS SMALL.. EF=> 55%. TRANSMITRAL SPECTRAL FLOW PATTREN IS SUGGESTIVE OF IMPAIRED LV RELAXATION. RV SYSTOLIC PRESSURE IS 72CNOB. LEFT ATRIAL SIZE IS NORMAL. AV APPEARS MILDLY SCLEROTIC. NO SIGN VALVE DISEASE NOTED.   LYMPHADENECTOMY Bilateral 08/30/2015   Procedure: PELVIC LYMPHADENECTOMY;  Surgeon: [name], MD;  Location: [facility];  Service: Urology;  Laterality: Bilateral;   NUCLEAR STRESS TEST N/A 01-21-2012   NORMAL PATTERN OF PERFUSION IN ALL REGIONS. POST STRESS LV SIZE IS NORMAL. NO EVIDENCE OF INDUCIBLE ISCHEMIA. EF 54%.   POLYPECTOMY     PROSTATE BIOPSY     ROBOT ASSISTED LAPAROSCOPIC RADICAL PROSTATECTOMY N/A 08/30/2015   Procedure: ROBOTIC ASSISTED LAPAROSCOPIC RADICAL PROSTATECTOMY;  Surgeon: [name], MD;  Location: [facility];  Service: Urology;  Laterality: N/A;   URINARY SPHINCTER IMPLANT N/A 03/20/2021   Procedure: ARTIFICIAL URINARY SPHINCTER CYSTOSCOPY;  Surgeon: Bjorn Loser, MD;  Location: [facility];  Service: Urology;  Laterality: N/A;  REQUESTING 90 MINS   US VENOUS LOWER EXT Right 03/07/11   PERSISTENT DVT IN RIGHT LOWER EXT.WITH PERSISTANT VISUALIZATION OF HYPOECHOIC THROMBUS WITHIN THE FEMORAL, PROFUNDA FEMORAL AND POPLITEAL VEINS. WHEN COMPARED TO PREVIOUS, CLOT IS NO LONGER IDENTIFIED WITH IN THE RIGHT CFV.   Social History   Social History Narrative   Not on file   Immunization History  Administered Date(s) Administered   Fluad Quad(high Dose 65+) 09/30/2019   Influenza, High Dose Seasonal PF 10/05/2018   Influenza,inj,Quad PF,6+ Mos 08/31/2015   Influenza-Unspecified 09/30/2016, 08/22/2017   Moderna Sars-Covid-2 Vaccination 01/21/2020, 02/18/2020, 10/25/2020   Pneumococcal Conjugate-13 10/05/2018   Pneumococcal-Unspecified 12/30/2012     Objective: Vital Signs: BP (!) 167/81 (BP Location: Left Arm, Patient Position:  Sitting, Cuff Size: Normal)    Pulse 62    Ht 5\' 9"  (1.753 m)    Wt 230 lb 3.2 oz (104.4 kg)    BMI 33.99 kg/m    Physical Exam Vitals and nursing note reviewed.  Constitutional:      Appearance: He is well-developed.  HENT:     Head: Normocephalic and atraumatic.  Eyes:     Conjunctiva/sclera: Conjunctivae normal.     Pupils: Pupils are equal, round, and reactive to light.  Cardiovascular:     Rate and Rhythm: Normal rate and regular rhythm.     Heart sounds: Normal heart sounds.  Pulmonary:     Effort: Pulmonary effort is normal.     Breath sounds: Normal breath sounds.  Abdominal:     General: Bowel sounds are normal.     Palpations: Abdomen is soft.  Musculoskeletal:     Cervical back: Normal range of motion and neck supple.  Skin:    General: Skin is warm and dry.     Capillary Refill: Capillary refill takes less than 2 seconds.  Neurological:     Mental Status: He is alert and oriented to person, place, and time.  Psychiatric:        Behavior: Behavior normal.     Musculoskeletal Exam: He had some limitation with lateral rotation of the cervical spine.  He had no point tenderness over thoracic or lumbar spine.  Thoracic kyphosis was noted.  Shoulder joints, elbow joints, wrist joints, MCPs PIPs and DIPs and good range of motion.  No synovitis was noted.  Hip joints and knee joints in good range of motion.  He had no tenderness over ankles or MTPs.  Bilateral pedal edema was noted.  CDAI Exam: CDAI Score: 0.4  Patient Global: 2 mm; Provider Global: 2 mm Swollen: 0 ; Tender: 0  Joint Exam 02/21/2022   No joint exam has been documented for this visit   There is currently no information documented on the homunculus. Go to the Rheumatology activity and complete the homunculus joint exam.  Investigation: No additional findings.  Imaging: No results found.  Recent Labs: Lab Results  Component Value Date   WBC 6.0 02/01/2022   HGB 15.2 02/01/2022   PLT 163  02/01/2022   NA 139 02/01/2022   K 4.1 02/01/2022   CL 101 02/01/2022   CO2 29 02/01/2022   GLUCOSE 127 (H) 02/01/2022   BUN 11 02/01/2022   CREATININE 1.36 (H) 02/01/2022  BILITOT 1.1 02/01/2022   ALKPHOS 64 02/01/2022   AST 37 02/01/2022   ALT 27 02/01/2022   PROT 6.5 02/01/2022   ALBUMIN 4.0 02/01/2022   CALCIUM 9.0 02/01/2022   GFRAA >60 09/20/2020   QFTBGOLDPLUS Negative 02/01/2022    Speciality Comments: ACTEMRA 4mg /kg x 4 weeks PPD negative 01/02/17  Procedures:  No procedures performed Allergies: Orencia [abatacept], Carbamazepine, Celecoxib, Cephalexin, Enbrel [etanercept], Humira [adalimumab], Levofloxacin, Sulfa antibiotics, and Sulfasalazine   Assessment / Plan:     Visit Diagnoses: Rheumatoid arthritis involving multiple sites with positive rheumatoid factor (HCC)-he had been tolerating Actemra and Arava combination.  He had no synovitis on my examination.  He notices some discomfort in his joints just before his Actemra infusion.  Patient states that he had recent upper respiratory tract infection which resolved by itself.  He has some residual cough.  He also has COPD.  High risk medication use - Actemra 4mg /kg IV infusions every 4 weeks and Arava 20 mg 1 tablet by mouth daily.  His last Actemra infusion was on February 01, 2022 at 4 mg/kg.  Labs obtained on February 01, 2022 showed CBC normal, CMP with creatinine 1.36.  TB gold was negative.  Lab findings were reviewed with the patient.  He also had lipid panel which showed LDL 107.  He has been told to stop Actemra and leflunomide in case he develops an infection and resume after the infection resolves.  Information on immunization was also placed in the AVS.  Chronic idiopathic gout involving toe without tophus, unspecified laterality - allopurinol 100 mg 2 tablets daily for management of gout.  He has been tolerating allopurinol without any side effects.  He denies any gout flare.  Primary osteoarthritis of both  hands-joint protection muscle strengthening was discussed.  Primary osteoarthritis of both knees-he has off-and-on discomfort.  Primary osteoarthritis of both feet-proper fitting shoes were discussed.  Age-related osteoporosis without current pathological fracture - DEXA on 09/28/2019 T-score -2.1 right femur neck with +12 % change in BMD. He is in taking Fosamax 70 mg 1 tablet by mouth once weekly since October 2018.  I advised him to stop alendronate.  He was advised to schedule DEXA scan.  History of abscessed tooth - 11/03/21-Clearance received on 10/3021 to resume arava and actemra.     History of COPD-he continues to have chronic cough.  Has been followed by pulmonologist.  History of seizure disorder  History of adenomatous polyp of colon  History of prostate cancer  History of CHF (congestive heart failure) - He is not a good candidate for TNF inhibitors.  History of DVT (deep vein thrombosis) -he is on warfarin.  Not a good candidate for the use of Jak inhibitors.  Orders: No orders of the defined types were placed in this encounter.  No orders of the defined types were placed in this encounter.    Follow-Up Instructions: Return in about 5 months (around 07/21/2022) for Rheumatoid arthritis, Osteoarthritis, Osteoporosis.   Bo Merino, MD  Note - This record has been created using Editor, commissioning.  Chart creation errors have been sought, but may not always  have been located. Such creation errors do not reflect on  the standard of medical care.

## 2022-02-12 ENCOUNTER — Other Ambulatory Visit: Payer: Self-pay | Admitting: *Deleted

## 2022-02-12 MED ORDER — FLUTICASONE FUROATE-VILANTEROL 100-25 MCG/ACT IN AEPB
1.0000 | INHALATION_SPRAY | Freq: Every day | RESPIRATORY_TRACT | 3 refills | Status: DC
Start: 2022-02-12 — End: 2022-04-12

## 2022-02-13 ENCOUNTER — Other Ambulatory Visit: Payer: Self-pay | Admitting: Cardiovascular Disease

## 2022-02-13 ENCOUNTER — Other Ambulatory Visit: Payer: Self-pay | Admitting: Physician Assistant

## 2022-02-14 NOTE — Telephone Encounter (Signed)
Next Visit: 02/21/2022   Last Visit: 11/30/2021   Last Fill: 12/28/2021  DX: Age-related osteoporosis without current pathological fracture   Current Dose per office note 11/30/2021: Fosamax 70 mg 1 tablet by mouth once weekly  Labs: 11/30/2021 CBC stable. Glucose is 111. Rest of CMP WNL.  Uric acid is WNL.    Okay to refill Fosamax?

## 2022-02-21 ENCOUNTER — Other Ambulatory Visit: Payer: Self-pay

## 2022-02-21 ENCOUNTER — Ambulatory Visit (INDEPENDENT_AMBULATORY_CARE_PROVIDER_SITE_OTHER): Payer: Medicare Other | Admitting: Rheumatology

## 2022-02-21 ENCOUNTER — Encounter: Payer: Self-pay | Admitting: Rheumatology

## 2022-02-21 VITALS — BP 167/81 | HR 62 | Ht 69.0 in | Wt 230.2 lb

## 2022-02-21 DIAGNOSIS — Z8719 Personal history of other diseases of the digestive system: Secondary | ICD-10-CM

## 2022-02-21 DIAGNOSIS — M19041 Primary osteoarthritis, right hand: Secondary | ICD-10-CM

## 2022-02-21 DIAGNOSIS — Z8546 Personal history of malignant neoplasm of prostate: Secondary | ICD-10-CM | POA: Diagnosis not present

## 2022-02-21 DIAGNOSIS — M19071 Primary osteoarthritis, right ankle and foot: Secondary | ICD-10-CM | POA: Diagnosis not present

## 2022-02-21 DIAGNOSIS — M0579 Rheumatoid arthritis with rheumatoid factor of multiple sites without organ or systems involvement: Secondary | ICD-10-CM | POA: Diagnosis not present

## 2022-02-21 DIAGNOSIS — M1A0790 Idiopathic chronic gout, unspecified ankle and foot, without tophus (tophi): Secondary | ICD-10-CM | POA: Diagnosis not present

## 2022-02-21 DIAGNOSIS — Z79899 Other long term (current) drug therapy: Secondary | ICD-10-CM | POA: Diagnosis not present

## 2022-02-21 DIAGNOSIS — Z8709 Personal history of other diseases of the respiratory system: Secondary | ICD-10-CM

## 2022-02-21 DIAGNOSIS — Z86718 Personal history of other venous thrombosis and embolism: Secondary | ICD-10-CM

## 2022-02-21 DIAGNOSIS — Z8669 Personal history of other diseases of the nervous system and sense organs: Secondary | ICD-10-CM

## 2022-02-21 DIAGNOSIS — M19042 Primary osteoarthritis, left hand: Secondary | ICD-10-CM

## 2022-02-21 DIAGNOSIS — Z86010 Personal history of colonic polyps: Secondary | ICD-10-CM | POA: Diagnosis not present

## 2022-02-21 DIAGNOSIS — Z860101 Personal history of adenomatous and serrated colon polyps: Secondary | ICD-10-CM

## 2022-02-21 DIAGNOSIS — Z8679 Personal history of other diseases of the circulatory system: Secondary | ICD-10-CM

## 2022-02-21 DIAGNOSIS — M542 Cervicalgia: Secondary | ICD-10-CM

## 2022-02-21 DIAGNOSIS — M810 Age-related osteoporosis without current pathological fracture: Secondary | ICD-10-CM

## 2022-02-21 DIAGNOSIS — M170 Bilateral primary osteoarthritis of knee: Secondary | ICD-10-CM

## 2022-02-21 DIAGNOSIS — M19072 Primary osteoarthritis, left ankle and foot: Secondary | ICD-10-CM

## 2022-02-21 DIAGNOSIS — M549 Dorsalgia, unspecified: Secondary | ICD-10-CM

## 2022-02-21 NOTE — Patient Instructions (Signed)

## 2022-03-01 ENCOUNTER — Other Ambulatory Visit: Payer: Self-pay

## 2022-03-01 ENCOUNTER — Encounter (HOSPITAL_COMMUNITY)
Admission: RE | Admit: 2022-03-01 | Discharge: 2022-03-01 | Disposition: A | Discharge status: Home / Self Care | Payer: Medicare Other | Source: Ambulatory Visit | Attending: Rheumatology | Admitting: Rheumatology

## 2022-03-01 DIAGNOSIS — M0579 Rheumatoid arthritis with rheumatoid factor of multiple sites without organ or systems involvement: Secondary | ICD-10-CM | POA: Diagnosis not present

## 2022-03-01 MED ORDER — DIPHENHYDRAMINE HCL 25 MG PO CAPS: 25.0000 mg | ORAL_CAPSULE | ORAL | Status: DC

## 2022-03-01 MED ORDER — TOCILIZUMAB 400 MG/20ML IV SOLN
400.0000 mg | INTRAVENOUS | Status: DC
  Administered 2022-03-01: 400 mg via INTRAVENOUS
  Filled 2022-03-01: qty 20

## 2022-03-01 MED ORDER — ACETAMINOPHEN 325 MG PO TABS: 650.0000 mg | ORAL_TABLET | ORAL | Status: DC

## 2022-03-07 ENCOUNTER — Other Ambulatory Visit: Payer: Self-pay

## 2022-03-07 ENCOUNTER — Other Ambulatory Visit: Payer: Medicare Other

## 2022-03-07 DIAGNOSIS — C61 Malignant neoplasm of prostate: Secondary | ICD-10-CM

## 2022-03-07 DIAGNOSIS — Z7901 Long term (current) use of anticoagulants: Secondary | ICD-10-CM | POA: Diagnosis not present

## 2022-03-08 LAB — PSA: Prostate Specific Ag, Serum: <0.1 ng/mL (ref 0.0–4.0)

## 2022-03-11 ENCOUNTER — Inpatient Hospital Stay (HOSPITAL_COMMUNITY)
Admission: EM | Admit: 2022-03-11 | Discharge: 2022-03-13 | DRG: 287 | Disposition: A | Discharge status: Home / Self Care | Payer: Medicare Other | Attending: Family Medicine | Admitting: Family Medicine

## 2022-03-11 ENCOUNTER — Other Ambulatory Visit: Payer: Self-pay

## 2022-03-11 ENCOUNTER — Ambulatory Visit: Payer: Medicare Other | Admitting: Urology

## 2022-03-11 ENCOUNTER — Emergency Department (HOSPITAL_COMMUNITY): Payer: Medicare Other

## 2022-03-11 ENCOUNTER — Telehealth: Payer: Self-pay | Admitting: Cardiovascular Disease

## 2022-03-11 ENCOUNTER — Encounter (HOSPITAL_COMMUNITY): Payer: Self-pay | Admitting: *Deleted

## 2022-03-11 DIAGNOSIS — Z888 Allergy status to other drugs, medicaments and biological substances status: Secondary | ICD-10-CM

## 2022-03-11 DIAGNOSIS — Z7901 Long term (current) use of anticoagulants: Secondary | ICD-10-CM

## 2022-03-11 DIAGNOSIS — Z8249 Family history of ischemic heart disease and other diseases of the circulatory system: Secondary | ICD-10-CM

## 2022-03-11 DIAGNOSIS — R0789 Other chest pain: Secondary | ICD-10-CM | POA: Diagnosis not present

## 2022-03-11 DIAGNOSIS — Z8052 Family history of malignant neoplasm of bladder: Secondary | ICD-10-CM

## 2022-03-11 DIAGNOSIS — Z803 Family history of malignant neoplasm of breast: Secondary | ICD-10-CM

## 2022-03-11 DIAGNOSIS — R072 Precordial pain: Secondary | ICD-10-CM

## 2022-03-11 DIAGNOSIS — J9811 Atelectasis: Secondary | ICD-10-CM | POA: Diagnosis not present

## 2022-03-11 DIAGNOSIS — J441 Chronic obstructive pulmonary disease with (acute) exacerbation: Secondary | ICD-10-CM | POA: Diagnosis present

## 2022-03-11 DIAGNOSIS — Z86718 Personal history of other venous thrombosis and embolism: Secondary | ICD-10-CM | POA: Diagnosis not present

## 2022-03-11 DIAGNOSIS — Z6833 Body mass index (BMI) 33.0-33.9, adult: Secondary | ICD-10-CM | POA: Diagnosis not present

## 2022-03-11 DIAGNOSIS — Z8709 Personal history of other diseases of the respiratory system: Secondary | ICD-10-CM | POA: Diagnosis not present

## 2022-03-11 DIAGNOSIS — Z7951 Long term (current) use of inhaled steroids: Secondary | ICD-10-CM | POA: Diagnosis not present

## 2022-03-11 DIAGNOSIS — Z881 Allergy status to other antibiotic agents status: Secondary | ICD-10-CM | POA: Diagnosis not present

## 2022-03-11 DIAGNOSIS — G4733 Obstructive sleep apnea (adult) (pediatric): Secondary | ICD-10-CM | POA: Diagnosis not present

## 2022-03-11 DIAGNOSIS — Z808 Family history of malignant neoplasm of other organs or systems: Secondary | ICD-10-CM

## 2022-03-11 DIAGNOSIS — Z8546 Personal history of malignant neoplasm of prostate: Secondary | ICD-10-CM

## 2022-03-11 DIAGNOSIS — E669 Obesity, unspecified: Secondary | ICD-10-CM | POA: Diagnosis present

## 2022-03-11 DIAGNOSIS — N1831 Chronic kidney disease, stage 3a: Secondary | ICD-10-CM

## 2022-03-11 DIAGNOSIS — K219 Gastro-esophageal reflux disease without esophagitis: Secondary | ICD-10-CM | POA: Diagnosis present

## 2022-03-11 DIAGNOSIS — R0602 Shortness of breath: Secondary | ICD-10-CM

## 2022-03-11 DIAGNOSIS — R079 Chest pain, unspecified: Principal | ICD-10-CM | POA: Diagnosis present

## 2022-03-11 DIAGNOSIS — Z87891 Personal history of nicotine dependence: Secondary | ICD-10-CM | POA: Diagnosis not present

## 2022-03-11 DIAGNOSIS — Z882 Allergy status to sulfonamides status: Secondary | ICD-10-CM

## 2022-03-11 DIAGNOSIS — M069 Rheumatoid arthritis, unspecified: Secondary | ICD-10-CM | POA: Diagnosis not present

## 2022-03-11 DIAGNOSIS — E7800 Pure hypercholesterolemia, unspecified: Secondary | ICD-10-CM | POA: Diagnosis present

## 2022-03-11 DIAGNOSIS — R0601 Orthopnea: Secondary | ICD-10-CM | POA: Diagnosis not present

## 2022-03-11 DIAGNOSIS — Z823 Family history of stroke: Secondary | ICD-10-CM

## 2022-03-11 DIAGNOSIS — M1A0790 Idiopathic chronic gout, unspecified ankle and foot, without tophus (tophi): Secondary | ICD-10-CM | POA: Diagnosis present

## 2022-03-11 DIAGNOSIS — Z820 Family history of epilepsy and other diseases of the nervous system: Secondary | ICD-10-CM

## 2022-03-11 DIAGNOSIS — Z20822 Contact with and (suspected) exposure to covid-19: Secondary | ICD-10-CM | POA: Diagnosis present

## 2022-03-11 DIAGNOSIS — I130 Hypertensive heart and chronic kidney disease with heart failure and stage 1 through stage 4 chronic kidney disease, or unspecified chronic kidney disease: Secondary | ICD-10-CM | POA: Diagnosis present

## 2022-03-11 DIAGNOSIS — Z79899 Other long term (current) drug therapy: Secondary | ICD-10-CM | POA: Diagnosis not present

## 2022-03-11 DIAGNOSIS — I5032 Chronic diastolic (congestive) heart failure: Secondary | ICD-10-CM | POA: Diagnosis present

## 2022-03-11 DIAGNOSIS — Z800 Family history of malignant neoplasm of digestive organs: Secondary | ICD-10-CM

## 2022-03-11 DIAGNOSIS — I200 Unstable angina: Secondary | ICD-10-CM | POA: Diagnosis not present

## 2022-03-11 DIAGNOSIS — Z801 Family history of malignant neoplasm of trachea, bronchus and lung: Secondary | ICD-10-CM

## 2022-03-11 DIAGNOSIS — I878 Other specified disorders of veins: Secondary | ICD-10-CM | POA: Diagnosis present

## 2022-03-11 LAB — CBC
HCT: 46.3 % (ref 39.0–52.0)
Hemoglobin: 15.3 g/dL (ref 13.0–17.0)
MCH: 30.4 pg (ref 26.0–34.0)
MCHC: 33 g/dL (ref 30.0–36.0)
MCV: 91.9 fL (ref 80.0–100.0)
Platelets: 151 10*3/uL (ref 150–400)
RBC: 5.04 MIL/uL (ref 4.22–5.81)
RDW: 15 % (ref 11.5–15.5)
WBC: 5 10*3/uL (ref 4.0–10.5)
nRBC: 0 % (ref 0.0–0.2)

## 2022-03-11 LAB — BASIC METABOLIC PANEL
Anion gap: 10 (ref 5–15)
BUN: 14 mg/dL (ref 8–23)
CO2: 27 mmol/L (ref 22–32)
Calcium: 9.1 mg/dL (ref 8.9–10.3)
Chloride: 102 mmol/L (ref 98–111)
Creatinine, Ser: 1.32 mg/dL — ABNORMAL HIGH (ref 0.61–1.24)
GFR, Estimated: 58 mL/min — ABNORMAL LOW (ref >60)
Glucose, Bld: 120 mg/dL — ABNORMAL HIGH (ref 70–99)
Potassium: 3.8 mmol/L (ref 3.5–5.1)
Sodium: 139 mmol/L (ref 135–145)

## 2022-03-11 LAB — BRAIN NATRIURETIC PEPTIDE: B Natriuretic Peptide: 38 pg/mL (ref 0.0–100.0)

## 2022-03-11 LAB — TROPONIN I (HIGH SENSITIVITY)
Troponin I (High Sensitivity): 7 ng/L (ref <18)
Troponin I (High Sensitivity): 6 ng/L (ref <18)

## 2022-03-11 LAB — RESP PANEL BY RT-PCR (FLU A&B, COVID) ARPGX2
Influenza A by PCR: NEGATIVE
Influenza B by PCR: NEGATIVE
SARS Coronavirus 2 by RT PCR: NEGATIVE

## 2022-03-11 LAB — PROTIME-INR
INR: 1.4 — ABNORMAL HIGH (ref 0.8–1.2)
Prothrombin Time: 16.7 seconds — ABNORMAL HIGH (ref 11.4–15.2)

## 2022-03-11 MED ORDER — SODIUM CHLORIDE 0.9 % IV SOLN: 250.0000 mL | INTRAVENOUS | Status: DC | PRN

## 2022-03-11 MED ORDER — ONDANSETRON HCL 4 MG/2ML IJ SOLN
4.0000 mg | Freq: Four times a day (QID) | INTRAMUSCULAR | Status: DC | PRN
Start: 2022-03-11 — End: 2022-03-13

## 2022-03-11 MED ORDER — ENOXAPARIN SODIUM 60 MG/0.6ML IJ SOSY
50.0000 mg | PREFILLED_SYRINGE | INTRAMUSCULAR | Status: DC
  Administered 2022-03-11 – 2022-03-12 (×2): 50 mg via SUBCUTANEOUS
  Filled 2022-03-11 (×2): qty 0.6

## 2022-03-11 MED ORDER — ACETAMINOPHEN 325 MG PO TABS: 650.0000 mg | ORAL_TABLET | Freq: Four times a day (QID) | ORAL | Status: DC | PRN

## 2022-03-11 MED ORDER — UMECLIDINIUM BROMIDE 62.5 MCG/ACT IN AEPB
1.0000 | INHALATION_SPRAY | Freq: Every day | RESPIRATORY_TRACT | Status: DC
  Administered 2022-03-12 – 2022-03-13 (×2): 1 via RESPIRATORY_TRACT
  Filled 2022-03-11: qty 7

## 2022-03-11 MED ORDER — ONDANSETRON HCL 4 MG PO TABS: 4.0000 mg | ORAL_TABLET | Freq: Four times a day (QID) | ORAL | Status: DC | PRN

## 2022-03-11 MED ORDER — LEFLUNOMIDE 20 MG PO TABS
20.0000 mg | ORAL_TABLET | Freq: Every day | ORAL | Status: DC
  Administered 2022-03-12 – 2022-03-13 (×2): 20 mg via ORAL
  Filled 2022-03-11 (×4): qty 1

## 2022-03-11 MED ORDER — FLUTICASONE FUROATE-VILANTEROL 100-25 MCG/ACT IN AEPB
1.0000 | INHALATION_SPRAY | Freq: Every day | RESPIRATORY_TRACT | Status: DC
  Filled 2022-03-11: qty 28

## 2022-03-11 MED ORDER — OXYBUTYNIN CHLORIDE ER 10 MG PO TB24
10.0000 mg | ORAL_TABLET | Freq: Every day | ORAL | Status: DC
  Administered 2022-03-12 – 2022-03-13 (×2): 10 mg via ORAL
  Filled 2022-03-11 (×2): qty 1

## 2022-03-11 MED ORDER — FUROSEMIDE 40 MG PO TABS
40.0000 mg | ORAL_TABLET | Freq: Two times a day (BID) | ORAL | Status: DC
Start: 2022-03-11 — End: 2022-03-13
  Administered 2022-03-11 – 2022-03-13 (×3): 40 mg via ORAL
  Filled 2022-03-11 (×4): qty 1

## 2022-03-11 MED ORDER — ALLOPURINOL 100 MG PO TABS
100.0000 mg | ORAL_TABLET | Freq: Two times a day (BID) | ORAL | Status: DC
Start: 2022-03-11 — End: 2022-03-13
  Administered 2022-03-11 – 2022-03-13 (×4): 100 mg via ORAL
  Filled 2022-03-11 (×4): qty 1

## 2022-03-11 MED ORDER — PHENYTOIN SODIUM EXTENDED 100 MG PO CAPS
200.0000 mg | ORAL_CAPSULE | Freq: Every day | ORAL | Status: DC
Start: 2022-03-11 — End: 2022-03-13
  Administered 2022-03-11 – 2022-03-12 (×2): 200 mg via ORAL
  Filled 2022-03-11 (×5): qty 2

## 2022-03-11 MED ORDER — PHENYTOIN SODIUM EXTENDED 100 MG PO CAPS
100.0000 mg | ORAL_CAPSULE | Freq: Every day | ORAL | Status: DC
Start: 2022-03-12 — End: 2022-03-13
  Administered 2022-03-13: 100 mg via ORAL
  Filled 2022-03-11 (×4): qty 1

## 2022-03-11 MED ORDER — UMECLIDINIUM BROMIDE 62.5 MCG/ACT IN AEPB: 1.0000 | INHALATION_SPRAY | Freq: Every day | RESPIRATORY_TRACT | Status: DC

## 2022-03-11 MED ORDER — SODIUM CHLORIDE 0.9% FLUSH: 3.0000 mL | INTRAVENOUS | Status: DC | PRN

## 2022-03-11 MED ORDER — ROSUVASTATIN CALCIUM 20 MG PO TABS
20.0000 mg | ORAL_TABLET | Freq: Every day | ORAL | Status: DC
  Administered 2022-03-11 – 2022-03-12 (×2): 20 mg via ORAL
  Filled 2022-03-11 (×2): qty 1

## 2022-03-11 MED ORDER — ALBUTEROL SULFATE (2.5 MG/3ML) 0.083% IN NEBU
2.5000 mg | INHALATION_SOLUTION | Freq: Four times a day (QID) | RESPIRATORY_TRACT | Status: DC | PRN
  Administered 2022-03-13: 2.5 mg via RESPIRATORY_TRACT
  Filled 2022-03-11: qty 3

## 2022-03-11 MED ORDER — FLUTICASONE FUROATE-VILANTEROL 100-25 MCG/ACT IN AEPB: 1.0000 | INHALATION_SPRAY | Freq: Every day | RESPIRATORY_TRACT | Status: DC

## 2022-03-11 MED ORDER — DICYCLOMINE HCL 10 MG PO CAPS
20.0000 mg | ORAL_CAPSULE | Freq: Two times a day (BID) | ORAL | Status: DC
Start: 2022-03-11 — End: 2022-03-13
  Administered 2022-03-11 – 2022-03-13 (×4): 20 mg via ORAL
  Filled 2022-03-11 (×5): qty 2

## 2022-03-11 MED ORDER — POTASSIUM CHLORIDE CRYS ER 20 MEQ PO TBCR
20.0000 meq | EXTENDED_RELEASE_TABLET | Freq: Every day | ORAL | Status: DC
  Administered 2022-03-12 – 2022-03-13 (×2): 20 meq via ORAL
  Filled 2022-03-11 (×2): qty 1

## 2022-03-11 MED ORDER — SODIUM CHLORIDE 0.9% FLUSH
3.0000 mL | Freq: Two times a day (BID) | INTRAVENOUS | Status: DC
  Administered 2022-03-11 – 2022-03-12 (×3): 3 mL via INTRAVENOUS

## 2022-03-11 MED ORDER — PANTOPRAZOLE SODIUM 40 MG PO TBEC
40.0000 mg | DELAYED_RELEASE_TABLET | Freq: Every day | ORAL | Status: DC
  Administered 2022-03-11 – 2022-03-13 (×3): 40 mg via ORAL
  Filled 2022-03-11 (×3): qty 1

## 2022-03-11 MED ORDER — DEXAMETHASONE SODIUM PHOSPHATE 10 MG/ML IJ SOLN
10.0000 mg | Freq: Once | INTRAMUSCULAR | Status: AC
  Administered 2022-03-11: 10 mg via INTRAVENOUS
  Filled 2022-03-11: qty 1

## 2022-03-11 MED ORDER — ACETAMINOPHEN 650 MG RE SUPP
650.0000 mg | Freq: Four times a day (QID) | RECTAL | Status: DC | PRN
Start: 2022-03-11 — End: 2022-03-13

## 2022-03-11 MED ORDER — IPRATROPIUM-ALBUTEROL 0.5-2.5 (3) MG/3ML IN SOLN
3.0000 mL | Freq: Once | RESPIRATORY_TRACT | Status: AC
  Administered 2022-03-11: 3 mL via RESPIRATORY_TRACT
  Filled 2022-03-11: qty 3

## 2022-03-11 NOTE — Assessment & Plan Note (Signed)
Seen by cardiology with plans to transfer to [facility] for catheterization in a.m. ?Plan to keep n.p.o. after midnight ?Monitor on telemetry ?

## 2022-03-11 NOTE — ED Provider Notes (Cosign Needed)
[facility] Provider Note   CSN: [id number] Arrival date & time: 03/11/22  1111     History  Chief Complaint  Patient presents with   Chest Pain    [name] is a 71 y.o. male.  With past medical history of CHF, COPD who presents to the emergency department with chest pain.  Patient states that he has had intermittent chest pain for 1 month.  He states that this morning around 2 AM he woke up feeling like he "could not breathe good."  He states that he got up and did a breathing treatment thinking it may have been a COPD.  He states that his breathing seemed to improve when he began having central chest "heaviness."  Again, he states he thought this was may be related to his COPD so he went to bed.  He states that when he woke up this morning he continued to have central, nonradiating chest heaviness.  He states that he called his cardiologist who instructed him to return to the emergency department.  States he is not currently having chest pain and feels that his breathing is improved.  During episode he denied palpitations, nausea, diaphoresis, new lower extremity swelling.  Denies recent new cough or fevers.   Chest Pain Associated symptoms: shortness of breath   Associated symptoms: no abdominal pain, no diaphoresis, no dizziness, no fever, no nausea, no palpitations and no vomiting       Home Medications Prior to Admission medications   Medication Sig Start Date End Date Taking? Authorizing Provider  acetaminophen (TYLENOL) 650 MG CR tablet Take 1,300 mg by mouth every 8 (eight) hours as needed for pain.   Yes [provider]  albuterol (PROVENTIL) (2.5 MG/3ML) 0.083% nebulizer solution Take 3 mLs (2.5 mg total) by nebulization every 6 (six) hours as needed for wheezing or shortness of breath. 11/23/19  Yes Elgergawy, Silver Huguenin, MD  allopurinol (ZYLOPRIM) 100 MG tablet TAKE 1 TABLET BY MOUTH TWICE A DAY 01/03/22  Yes Ofilia Neas, PA-C   diclofenac Sodium (VOLTAREN) 1 % GEL Apply 2-4 grams to affected joint 4 times daily as needed. Patient taking differently: Apply 2-4 g topically 4 (four) times daily as needed (RA Pain). Apply 2-4 grams to affected joint 4 times daily as needed. 03/02/20  Yes Ofilia Neas, PA-C  dicyclomine (BENTYL) 10 MG capsule TAKE 1 CAPSULE BY MOUTH 4 TIMES DAILY BEFORE MEALS AND AT BEDTIME Patient taking differently: Take 20 mg by mouth 2 (two) times daily. 08/11/17  Yes Ladene Artist, MD  fluticasone furoate-vilanterol (BREO ELLIPTA) 100-25 MCG/ACT AEPB Inhale 1 puff into the lungs daily. 02/12/22  Yes Rigoberto Noel, MD  folic acid (FOLVITE) 1 MG tablet TAKE 2 TABLETS BY MOUTH EVERY DAY IN THE MORNING 06/08/21  Yes Ofilia Neas, PA-C  furosemide (LASIX) 40 MG tablet Take 1 tablet (40 mg total) by mouth 2 (two) times daily. SCHEDULE OFFICE VISIT FOR FUTURE REFILLS, 3RD ATTEMPT. 02/14/22  Yes Croitoru, Mihai, MD  INCRUSE ELLIPTA 62.5 MCG/ACT AEPB INHALE 1 PUFF BY MOUTH EVERY DAY 02/07/22  Yes Rigoberto Noel, MD  leflunomide (ARAVA) 20 MG tablet TAKE 1 TABLET BY MOUTH EVERY DAY 01/03/22  Yes Ofilia Neas, PA-C  omeprazole (PRILOSEC) 40 MG capsule Take 40 mg by mouth at bedtime. 09/23/16  Yes [provider]  oxybutynin (DITROPAN-XL) 10 MG 24 hr tablet Take 10 mg by mouth daily. 08/18/20  Yes [provider]  phenytoin (  DILANTIN) 100 MG ER capsule Take 100-200 mg by mouth See admin instructions. Take one capsule in the morning and 2 capsules at bedtime   Yes [provider]  Potassium Chloride ER 20 MEQ TBCR Take 20 mEq by mouth daily. 02/24/19  Yes [provider]  rosuvastatin (CRESTOR) 10 MG tablet Take 10 mg by mouth daily. 06/14/20  Yes [provider]  Tocilizumab (ACTEMRA IV) Inject 4 mg/kg into the vein every 28 (twenty-eight) days. For Rheumatoid Arthritis. Last ordered 06/22/20 x 2 doses   Yes [provider]  warfarin (COUMADIN) 3 MG tablet Take 1 tablet  (3 mg total) by mouth at bedtime. Restart on 3/24 (48 hours after surgery) 03/22/21  Yes MacDiarmid, Nicki Reaper, MD  chlorhexidine (PERIDEX) 0.12 % solution SMARTSIG:By Mouth Patient not taking: Reported on 02/21/2022 10/30/21   [provider]      Allergies    Orencia [abatacept], Carbamazepine, Celecoxib, Cephalexin, Enbrel [etanercept], Humira [adalimumab], Levofloxacin, Sulfa antibiotics, and Sulfasalazine    Review of Systems   Review of Systems  Constitutional:  Negative for diaphoresis and fever.  Respiratory:  Positive for chest tightness, shortness of breath and wheezing.   Cardiovascular:  Positive for chest pain. Negative for palpitations.  Gastrointestinal:  Negative for abdominal pain, nausea and vomiting.  Neurological:  Negative for dizziness, syncope and light-headedness.  All other systems reviewed and are negative.  Physical Exam Updated Vital Signs BP 130/90    Pulse 71    Temp 98.1 F (36.7 C) (Oral)    Resp 19    Ht '5\' 9"'$  (1.753 m)    Wt 104.3 kg    SpO2 95%    BMI 33.97 kg/m  Physical Exam Vitals and nursing note reviewed.  Constitutional:      General: He is not in acute distress.    Appearance: Normal appearance. He is well-developed. He is obese. He is not ill-appearing, toxic-appearing or diaphoretic.  HENT:     Head: Normocephalic and atraumatic.     Nose: Nose normal.     Mouth/Throat:     Mouth: Mucous membranes are moist.  Eyes:     General: No scleral icterus.    Extraocular Movements: Extraocular movements intact.  Neck:     Vascular: No JVD.  Cardiovascular:     Rate and Rhythm: Normal rate and regular rhythm.     Pulses: Normal pulses.          Radial pulses are 2+ on the right side and 2+ on the left side.     Heart sounds: Normal heart sounds. No murmur heard.    Comments: Chronic lower extremity pitting edema.  No unilateral swelling. Pulmonary:     Effort: Pulmonary effort is normal. No tachypnea or respiratory distress.     Breath  sounds: No stridor. Examination of the right-upper field reveals wheezing. Examination of the left-upper field reveals wheezing. Examination of the right-middle field reveals wheezing. Examination of the left-middle field reveals wheezing. Examination of the right-lower field reveals wheezing. Examination of the left-lower field reveals wheezing. Wheezing present.  Chest:     Chest wall: No tenderness.  Abdominal:     General: Bowel sounds are normal.     Palpations: Abdomen is soft.     Tenderness: There is no abdominal tenderness.  Musculoskeletal:        General: Normal range of motion.     Cervical back: Normal range of motion and neck supple.     Right lower leg: No  tenderness. 2+ Pitting Edema present.     Left lower leg: No tenderness. 2+ Pitting Edema present.  Skin:    General: Skin is warm and dry.     Capillary Refill: Capillary refill takes less than 2 seconds.  Neurological:     General: No focal deficit present.     Mental Status: He is alert and oriented to person, place, and time. Mental status is at baseline.  Psychiatric:        Mood and Affect: Mood normal.        Behavior: Behavior normal.        Thought Content: Thought content normal.        Judgment: Judgment normal.    ED Results / Procedures / Treatments   Labs (all labs ordered are listed, but only abnormal results are displayed) Labs Reviewed  BASIC METABOLIC PANEL - Abnormal; Notable for the following components:      Result Value   Glucose, Bld 120 (*)    Creatinine, Ser 1.32 (*)    GFR, Estimated 58 (*)    All other components within normal limits  PROTIME-INR - Abnormal; Notable for the following components:   Prothrombin Time 16.7 (*)    INR 1.4 (*)    All other components within normal limits  RESP PANEL BY RT-PCR (FLU A&B, COVID) ARPGX2  CBC  BRAIN NATRIURETIC PEPTIDE  TROPONIN I (HIGH SENSITIVITY)  TROPONIN I (HIGH SENSITIVITY)   EKG EKG Interpretation  Date/Time:  Monday March 11 2022 11:24:47 EDT Ventricular Rate:  69 PR Interval:  161 QRS Duration: 86 QT Interval:  384 QTC Calculation: 412 R Axis:   70 Text Interpretation: Sinus rhythm Abnormal R-wave progression, early transition Baseline wander in lead(s) V2 Confirmed by Milton Ferguson [phone]) on 03/11/2022 2:16:10 PM  Radiology DG Chest 2 View  Result Date: 03/11/2022 CLINICAL DATA:  Chest pain EXAM: CHEST - 2 VIEW COMPARISON:  Radiograph 11/21/2019 FINDINGS: Unchanged cardiomediastinal silhouette. Unchanged elevated left hemidiaphragm with left basilar atelectasis. No large pleural effusion. No visible pneumothorax. No acute osseous abnormality. Thoracic spondylosis. IMPRESSION: Unchanged elevated left hemidiaphragm with left basilar atelectasis. No new airspace disease. Electronically Signed   By: Maurine Simmering M.D.   On: 03/11/2022 12:09    Procedures Procedures   Medications Ordered in ED Medications  ipratropium-albuterol (DUONEB) 0.5-2.5 (3) MG/3ML nebulizer solution 3 mL (3 mLs Nebulization Given 03/11/22 1331)  dexamethasone (DECADRON) injection 10 mg (10 mg Intravenous Given 03/11/22 1328)    ED Course/ Medical Decision Making/ A&P                           Medical Decision Making Amount and/or Complexity of Data Reviewed Labs: ordered. Radiology: ordered.  Risk Prescription drug management. Decision regarding hospitalization.  This patient presents to the ED for concern of chest pain, this involves an extensive number of treatment options, and is a complaint that carries with it a high risk of complications and morbidity.  The differential diagnosis includes CHF, COPD, ACS, PE, thoracic dissection, pneumonia, asthma, coronary vasospasm, atypical chest pain  Co morbidities that complicate the patient evaluation Severe COPD, obstructive sleep apnea, congestive heart failure  Additional history obtained:  Additional history obtained from: Wife at bedside External records from outside source  obtained and reviewed including: Previous cardiology and pulmonology visits  EKG: EKG: normal EKG, normal sinus rhythm, unchanged from previous tracings.   Cardiac Monitoring: The patient was maintained on a cardiac monitor.  I personally viewed and interpreted the cardiac monitored which showed an underlying rhythm of: Sinus rhythm  Lab Results: I personally ordered, reviewed, and interpreted labs. Pertinent results include: BMP with creatinine 1.32, similar to previous, no electrolyte derangements CBC within normal limits Troponin 7, 6, delta -1 COVID and flu negative BNP, PT/INR pending  Imaging Studies ordered:  I ordered imaging studies which included x-ray.  I independently reviewed & interpreted imaging & am in agreement with radiology impression. Imaging shows: Unchanged elevated left hemidiaphragm with left basilar atelectasis.  No new airspace disease.  Medications  I ordered medication including DuoNeb, steroids for wheezing Reevaluation of the patient after medication shows that patient improved  Consultations: I requested consultation with the cardiologist, Dr. Johney Frame,  and discussed lab and imaging findings as well as pertinent plan - they recommend: We will come by to see patient  ED Course: 71 year old male who presents emergency department chest pain.  Unlikely to be acute PE as his presentation is not consistent.  Chest x-ray without pneumothorax or evidence of pneumonia.  He does not have any infectious symptoms.  Presentation is also not consistent with thoracic aortic dissection, pericarditis, tamponade, myocarditis.  HEAR Score: 5    Likely multifactorial chest pain.  Patient does have a history of heart failure however last EF in 2020 was 55 to 60%.  He does endorse that he has had intermittent chest pain over the past month and more difficulty laying flat.  He states that recently he had to move to his recliner, and will intermittently lay down and has to  sit right back up to help his breathing. BNP pending  Additionally has a history of COPD and severe obstructive sleep apnea.  He was wheezing on my exam.  Given DuoNeb and steroids with some improvement in symptoms.  Likely contributing to some of his chest pain and shortness of breath symptoms.  His EKG does not show any ischemia or infarction.  Troponin x2 negative.  Doubt ACS at this time.  1421: Richardean Sale, MD, cardiology who will come by to see patient.  1443: Dr. Johney Frame at bedside.  She requested medicine admission with patient over to [facility] for cardiac catheterization.  She request add on BNP, as well as PT/INR for procedure tomorrow.  Patient is agreeable to this plan.  Discussed with hospitalist and will admit to Dr. Manuella Ghazi over at [facility].  After consideration of the diagnostic results and the patients response to treatment, I feel that the patent would benefit from admission. The patient has been appropriately medically screened and/or stabilized in the ED. I have low suspicion for any other emergent medical condition which would require further screening, evaluation or treatment in the ED.  Final Clinical Impression(s) / ED Diagnoses Final diagnoses:  Chest pain, unspecified type    Rx / DC Orders ED Discharge Orders     None         Mickie Hillier, PA-C 03/11/22 1514

## 2022-03-11 NOTE — Telephone Encounter (Signed)
Thanks, seen by Dr. Johney Frame in ED and scheduled for heart cath at [facility] . ?

## 2022-03-11 NOTE — ED Notes (Signed)
[facility] at bedside 

## 2022-03-11 NOTE — ED Triage Notes (Signed)
Chest pain intermittent for over a month ?

## 2022-03-11 NOTE — H&P (Signed)
History and Physical    Patient: [name] NUU:[id number] DOB: 1951-07-12 DOA: 03/11/2022 DOS: the patient was seen and examined on 03/11/2022 PCP: [name], MD  Patient coming from: Home  Chief Complaint:  Chief Complaint  Patient presents with   Chest Pain   HPI: [name] is a 71 y.o. male with medical history significant of COPD, prior DVT on warfarin, CKD stage IIIa ,OSA, gout, GERD, and RA who presented to the ED with chest pressure and shortness of breath upon awakening last night to use the restroom.  He has had intermittent episodes of substernal chest pressure over the last 1 month with no particular aggravating or alleviating factors noted.  He states that he does get short of breath and this is typically worse when laying flat.  He called his cardiology clinic who instructed him to come to the ED for further evaluation.  He denies any cough, fevers, chills, or sick contacts.  In the ED, troponins have been 6 and then 7.  EKG with no acute changes noted.  Creatinine is 1.32 and near baseline.  He was apparently wheezing when he first arrived and was given some steroids and breathing treatments and currently states that he is asymptomatic.  He denies any further chest pain at this time.  Cardiology evaluation has been performed with recommendation to go to [facility] for catheterization in a.m.  Review of Systems: As mentioned in the history of present illness. All other systems reviewed and are negative. 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 Unknown origin.   Shortness of breath dyspnea    exertion    Sleep apnea    cpap    Tubular adenoma of colon 04/2008   Past Surgical History:  Procedure Laterality Date   CHOLECYSTECTOMY     CIRCUMCISION     COLONOSCOPY     CYSTOSCOPY WITH LITHOLAPAXY N/A 03/17/2019   Procedure: CYSTOSCOPY WITH LITHOLAPAXY AND REMOVAL OF FOREIGN BODY;  Surgeon: [name], MD;  Location: [facility];  Service: Urology;  Laterality: N/A;   DOPPLER ECHOCARDIOGRAPHY N/A 01-21-2012   TECHNICALLY DIFFICULT. MILD CONCENTRIC LV HYPERTROPHY. LV CAVITY IS SMALL.. EF=> 55%. TRANSMITRAL SPECTRAL FLOW PATTREN IS SUGGESTIVE OF IMPAIRED LV RELAXATION. RV SYSTOLIC PRESSURE IS 34VQQV. LEFT ATRIAL SIZE IS NORMAL. AV APPEARS MILDLY SCLEROTIC. NO SIGN VALVE DISEASE NOTED.   LYMPHADENECTOMY Bilateral 08/30/2015   Procedure: PELVIC LYMPHADENECTOMY;  Surgeon: [name], MD;  Location: [facility];  Service: Urology;  Laterality: Bilateral;   NUCLEAR STRESS TEST N/A 01-21-2012   NORMAL PATTERN OF PERFUSION IN ALL REGIONS. POST STRESS LV SIZE IS NORMAL. NO EVIDENCE OF INDUCIBLE ISCHEMIA. EF 54%.   POLYPECTOMY     PROSTATE BIOPSY     ROBOT ASSISTED LAPAROSCOPIC RADICAL PROSTATECTOMY N/A 08/30/2015   Procedure: ROBOTIC ASSISTED LAPAROSCOPIC RADICAL PROSTATECTOMY;  Surgeon: [name], MD;  Location: [facility];  Service: Urology;  Laterality: N/A;   URINARY SPHINCTER IMPLANT N/A 03/20/2021   Procedure: ARTIFICIAL URINARY SPHINCTER CYSTOSCOPY;  Surgeon: Bjorn Loser, MD;  Location: [facility];  Service: Urology;  Laterality: N/A;  REQUESTING 90 MINS   US VENOUS LOWER EXT Right 03/07/11    PERSISTENT DVT IN RIGHT LOWER EXT.WITH PERSISTANT VISUALIZATION OF HYPOECHOIC THROMBUS WITHIN THE FEMORAL, PROFUNDA FEMORAL AND POPLITEAL VEINS. WHEN COMPARED TO PREVIOUS, CLOT IS NO LONGER IDENTIFIED WITH IN THE RIGHT CFV.   Social History:  reports that he quit smoking about 25 years ago. His smoking use included cigarettes. He has a 60.00 pack-year smoking history. He has never used smokeless tobacco. He reports that he does not drink alcohol and does not use drugs.  Allergies  Allergen Reactions   Orencia [Abatacept] Anaphylaxis    Had prostate cancer   Carbamazepine Rash    REACTION: makes drowsy BRAND NAME IS TEGRETOL   Celecoxib Itching and Rash    BRAND NAME IS CELEBREX   Cephalexin Rash    "Severe Rash".  BRAND NAME IS KEFLEX.    Enbrel [Etanercept] Rash   Humira [Adalimumab] Rash   Levofloxacin Other (See Comments)    REACTION: GI Intolerance BRAND NAME IS LEVAQUIN   Sulfa Antibiotics Rash   Sulfasalazine Rash    Family History  Problem Relation Age of Onset   Skin cancer Father    Stomach cancer Father    Heart attack Father    Alzheimer's disease Mother    Throat cancer Brother    Stomach cancer Brother    Lung cancer Brother    Heart attack Brother    Heart attack Brother    Breast cancer Sister    Heart attack Sister    Stroke Sister    Bladder Cancer Sister    Heart murmur Sister    Colon cancer Brother    Colon polyps Neg Hx     Prior to Admission medications   Medication Sig Start Date End Date Taking? Authorizing Provider  acetaminophen (TYLENOL) 650 MG CR tablet Take 1,300 mg by mouth every 8 (eight) hours as needed for pain.   Yes [provider]  albuterol (PROVENTIL) (2.5 MG/3ML) 0.083% nebulizer solution Take 3 mLs (2.5 mg total) by nebulization every 6 (six) hours as needed for wheezing or shortness of breath. 11/23/19  Yes Elgergawy, Silver Huguenin, MD  allopurinol (ZYLOPRIM) 100 MG tablet TAKE 1 TABLET BY MOUTH TWICE A DAY 01/03/22  Yes  Ofilia Neas, PA-C  diclofenac Sodium (VOLTAREN) 1 % GEL Apply 2-4 grams to affected joint 4 times daily as needed. Patient taking differently: Apply 2-4 g topically 4 (four) times daily as needed (RA Pain). Apply 2-4 grams to affected joint 4 times daily as needed. 03/02/20  Yes Ofilia Neas, PA-C  dicyclomine (BENTYL) 10 MG capsule TAKE 1 CAPSULE BY MOUTH 4 TIMES DAILY BEFORE MEALS AND AT BEDTIME Patient taking differently: Take 20 mg by mouth 2 (two) times daily. 08/11/17  Yes Ladene Artist, MD  fluticasone furoate-vilanterol (BREO ELLIPTA) 100-25 MCG/ACT AEPB Inhale 1 puff into the  lungs daily. 02/12/22  Yes Rigoberto Noel, MD  folic acid (FOLVITE) 1 MG tablet TAKE 2 TABLETS BY MOUTH EVERY DAY IN THE MORNING 06/08/21  Yes Ofilia Neas, PA-C  furosemide (LASIX) 40 MG tablet Take 1 tablet (40 mg total) by mouth 2 (two) times daily. SCHEDULE OFFICE VISIT FOR FUTURE REFILLS, 3RD ATTEMPT. 02/14/22  Yes Croitoru, Mihai, MD  INCRUSE ELLIPTA 62.5 MCG/ACT AEPB INHALE 1 PUFF BY MOUTH EVERY DAY 02/07/22  Yes Rigoberto Noel, MD  leflunomide (ARAVA) 20 MG tablet TAKE 1 TABLET BY MOUTH EVERY DAY 01/03/22  Yes Ofilia Neas, PA-C  omeprazole (PRILOSEC) 40 MG capsule Take 40 mg by mouth at bedtime. 09/23/16  Yes [provider]  oxybutynin (DITROPAN-XL) 10 MG 24 hr tablet Take 10 mg by mouth daily. 08/18/20  Yes [provider]  phenytoin (DILANTIN) 100 MG ER capsule Take 100-200 mg by mouth See admin instructions. Take one capsule in the morning and 2 capsules at bedtime   Yes [provider]  Potassium Chloride ER 20 MEQ TBCR Take 20 mEq by mouth daily. 02/24/19  Yes [provider]  rosuvastatin (CRESTOR) 10 MG tablet Take 10 mg by mouth daily. 06/14/20  Yes [provider]  Tocilizumab (ACTEMRA IV) Inject 4 mg/kg into the vein every 28 (twenty-eight) days. For Rheumatoid Arthritis. Last ordered 06/22/20 x 2 doses   Yes [provider]  warfarin (COUMADIN) 3 MG  tablet Take 1 tablet (3 mg total) by mouth at bedtime. Restart on 3/24 (48 hours after surgery) 03/22/21  Yes MacDiarmid, Nicki Reaper, MD  chlorhexidine (PERIDEX) 0.12 % solution SMARTSIG:By Mouth Patient not taking: Reported on 02/21/2022 10/30/21   [provider]    Physical Exam: Vitals:   03/11/22 1200 03/11/22 1300 03/11/22 1330 03/11/22 1400  BP: (!) 161/88 130/90 (!) 147/101 (!) 149/82  Pulse: 72 71 68 69  Resp: '16 19 14 17  '$ Temp:      TempSrc:      SpO2: 95% 95% 95% 93%  Weight:      Height:       General exam: Appears calm and comfortable  Respiratory system: Clear to auscultation. Respiratory effort normal.  Currently on room air Cardiovascular system: S1 & S2 heard, RRR.  Gastrointestinal system: Abdomen is soft Central nervous system: Alert and awake Extremities: Scant bilateral edema Skin: No significant lesions noted Psychiatry: Flat affect. Data Reviewed:   There are no new results to review at this time.  Assessment and Plan: * Chest pain Seen by cardiology with plans to transfer to [facility] for catheterization in a.m. Plan to keep n.p.o. 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 Maintain on Lovenox for DVT prophylaxis  GERD PPI    Advance Care Planning:  Full  Consults: Cardiology  Family Communication: None at bedside  Severity of Illness: The appropriate patient status for this patient  is INPATIENT. Inpatient status is judged to be reasonable and necessary in order to provide the required intensity of service to ensure the patient's safety. The patient's presenting symptoms, physical exam findings, and initial radiographic and laboratory data in the context of their chronic comorbidities is felt to place them at high risk for further clinical deterioration. Furthermore, it is not anticipated that the patient will be medically stable for discharge from the hospital within 2 midnights of admission.   * I certify that at the point of admission it is my clinical judgment that the patient will require inpatient hospital care spanning beyond 2 midnights from the point of admission due to high intensity of service, high risk for further deterioration and high frequency of surveillance required.*  Author: Rodena Goldmann, DO 03/11/2022 3:33 PM  For on call review [email or web address].

## 2022-03-11 NOTE — Telephone Encounter (Signed)
Pt c/o of Chest Pain: STAT if CP now or developed within 24 hours ? ?1. Are you having CP right now?  ?Chest discomfort/heaviness, denies pain. Assumes this may be gas, but unsure. Would like to know if he needs to go to the ED. I have him scheduled for 4/20 with Sande Rives, PA. ? ?2. Are you experiencing any other symptoms (ex. SOB, nausea, vomiting, sweating)?  ?Elevated BP the past month - 166/93 last night, 3/12 ? ?3. How long have you been experiencing CP?  ?About 1 month on an off  ? ?4. Is your CP continuous or coming and going?  ?Coming and going ? ?5. Have you taken Nitroglycerin?  ?No  ?? ? ?

## 2022-03-11 NOTE — Assessment & Plan Note (Signed)
Patient initially noted to have some wheezing for which she received steroids and breathing treatment in ED ?No active bronchospasms currently noted ?DuoNebs as needed for shortness of breath or wheezing ?

## 2022-03-11 NOTE — Consult Note (Signed)
Cardiology Consultation:   Patient ID: [name] MRN: [id number]; DOB: Jul 08, 1951  Admit date: 03/11/2022 Date of Consult: 03/11/2022  PCP:  [name], MD   [facility] Providers Cardiologist:  [name], MD        Patient Profile:   [name] is a 71 y.o. male with a hx of RA, COPD, prior DVT on warfarin, and OSA who is being seen 03/11/2022 for the evaluation of chest pain at the request of Dr. Manuella Ghazi.  History of Present Illness:   [name] is a 71 year old with history as detailed above who is followed by Dr. Sallyanne Kuster as outpatient. He has no known history of obstructive CAD with no prior cath or PCI, but noted to have coronary calcification on CT chest in 2020. He has chronic LE edema in the setting of chronic venous stasis for which he takes lasix daily. Last TTE 05/2019 with LVEF 55-60%, normal RV size, aortic calcification without stenosis, no other significant valve disease. Was last seen in clinic in 08/2020 where he was having issues with venous ulceration but was otherwise doing well.   Today, the patient presented to the ER with chest pressure and associated shortness of breath when he woke up in the night to use the restroom. Specifically, the patient states that over the past month, he has had several episodes of chest pressure/heaviness that come on both with exertion and with rest that resolves with "sitting down and relaxing." Also has noted to have worsening SOB when laying flat and has been sleeping in his recliner. Today, he woke up at 2am with chest heaviness and SOB. He thought it may have been due to his COPD and he used a breathing treatment and went back to bed. When he woke up, his symptoms returned. He called Cards clinic who instructed him to come to the ER for further management.  Here, trop 6>7. ECG with NSR with no acute ischemic changes. Cr at baseline at 1.32. INR 1.4 and BNP pending. He was  wheezing on presentation and was given a  nebulizer and steroids.   Currently, the patient is chest pain free. We had a long discussion about the options going forward, however, given risk factors, progressive symptoms, and inability to tolerate myoview due to active wheezing, will plan to transfer to [facility] for cath.  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 Unknown origin.   Shortness of breath dyspnea    exertion    Sleep  apnea    cpap    Tubular adenoma of colon 04/2008    Past Surgical History:  Procedure Laterality Date   CHOLECYSTECTOMY     CIRCUMCISION     COLONOSCOPY     CYSTOSCOPY WITH LITHOLAPAXY N/A 03/17/2019   Procedure: CYSTOSCOPY WITH LITHOLAPAXY AND REMOVAL OF FOREIGN BODY;  Surgeon: [name], MD;  Location: [facility];  Service: Urology;  Laterality: N/A;   DOPPLER ECHOCARDIOGRAPHY N/A 01-21-2012   TECHNICALLY DIFFICULT. MILD CONCENTRIC LV HYPERTROPHY. LV CAVITY IS SMALL.. EF=> 55%. TRANSMITRAL SPECTRAL FLOW PATTREN IS SUGGESTIVE OF IMPAIRED LV RELAXATION. RV SYSTOLIC PRESSURE IS 69GEXB. LEFT ATRIAL SIZE IS NORMAL. AV APPEARS MILDLY SCLEROTIC. NO SIGN VALVE DISEASE NOTED.   LYMPHADENECTOMY Bilateral 08/30/2015   Procedure: PELVIC LYMPHADENECTOMY;  Surgeon:  [name], MD;  Location: [facility];  Service: Urology;  Laterality: Bilateral;   NUCLEAR STRESS TEST N/A 01-21-2012   NORMAL PATTERN OF PERFUSION IN ALL REGIONS. POST STRESS LV SIZE IS NORMAL. NO EVIDENCE OF INDUCIBLE ISCHEMIA. EF 54%.   POLYPECTOMY     PROSTATE BIOPSY     ROBOT ASSISTED LAPAROSCOPIC RADICAL PROSTATECTOMY N/A 08/30/2015   Procedure: ROBOTIC ASSISTED LAPAROSCOPIC RADICAL PROSTATECTOMY;  Surgeon: [name], MD;  Location: [facility];  Service: Urology;  Laterality: N/A;   URINARY SPHINCTER IMPLANT N/A 03/20/2021   Procedure: ARTIFICIAL URINARY SPHINCTER CYSTOSCOPY;  Surgeon: Bjorn Loser, MD;  Location: [facility];  Service: Urology;  Laterality: N/A;  REQUESTING 90 MINS   US VENOUS LOWER EXT Right 03/07/11   PERSISTENT DVT IN RIGHT LOWER EXT.WITH PERSISTANT VISUALIZATION OF HYPOECHOIC THROMBUS WITHIN THE FEMORAL, PROFUNDA FEMORAL AND POPLITEAL VEINS. WHEN COMPARED TO PREVIOUS, CLOT IS NO LONGER IDENTIFIED WITH IN THE RIGHT CFV.     Home Medications:  Prior to Admission medications   Medication Sig Start Date End Date Taking? Authorizing Provider  acetaminophen (TYLENOL) 650 MG CR tablet Take 1,300 mg by mouth every 8 (eight) hours as needed for pain.   Yes [provider]  albuterol (PROVENTIL) (2.5 MG/3ML) 0.083% nebulizer solution Take 3 mLs (2.5 mg total) by nebulization every 6 (six) hours as needed for wheezing or shortness of breath. 11/23/19  Yes Elgergawy, Silver Huguenin, MD  allopurinol (ZYLOPRIM) 100 MG tablet TAKE 1 TABLET BY MOUTH TWICE A DAY 01/03/22  Yes Ofilia Neas, PA-C  diclofenac Sodium (VOLTAREN) 1 % GEL Apply 2-4 grams to affected joint 4 times daily as needed. Patient taking differently: Apply 2-4 g topically 4 (four) times daily as needed (RA Pain). Apply 2-4 grams to affected joint 4 times daily as needed. 03/02/20  Yes Ofilia Neas, PA-C  dicyclomine (BENTYL) 10 MG capsule TAKE 1 CAPSULE BY MOUTH 4 TIMES DAILY BEFORE MEALS AND AT  BEDTIME Patient taking differently: Take 20 mg by mouth 2 (two) times daily. 08/11/17  Yes Ladene Artist, MD  fluticasone furoate-vilanterol (BREO ELLIPTA) 100-25 MCG/ACT AEPB Inhale 1 puff into the lungs daily. 02/12/22  Yes Rigoberto Noel, MD  folic acid (FOLVITE) 1 MG tablet TAKE 2 TABLETS BY MOUTH EVERY DAY IN THE MORNING 06/08/21  Yes Ofilia Neas, PA-C  furosemide (LASIX) 40 MG tablet Take 1 tablet (40 mg total) by mouth 2 (two) times daily. SCHEDULE OFFICE VISIT FOR FUTURE REFILLS, 3RD ATTEMPT. 02/14/22  Yes Croitoru, Mihai, MD  INCRUSE ELLIPTA 62.5 MCG/ACT AEPB INHALE 1 PUFF BY MOUTH EVERY DAY 02/07/22  Yes Rigoberto Noel, MD  leflunomide (ARAVA) 20 MG tablet TAKE 1 TABLET BY MOUTH  EVERY DAY 01/03/22  Yes Ofilia Neas, PA-C  omeprazole (PRILOSEC) 40 MG capsule Take 40 mg by mouth at bedtime. 09/23/16  Yes [provider]  oxybutynin (DITROPAN-XL) 10 MG 24 hr tablet Take 10 mg by mouth daily. 08/18/20  Yes [provider]  phenytoin (DILANTIN) 100 MG ER capsule Take 100-200 mg by mouth See admin instructions. Take one capsule in the morning and 2 capsules at bedtime   Yes [provider]  Potassium Chloride ER 20 MEQ TBCR Take 20 mEq by mouth daily. 02/24/19  Yes [provider]  rosuvastatin (CRESTOR) 10 MG tablet Take 10 mg by mouth daily. 06/14/20  Yes [provider]  Tocilizumab (ACTEMRA IV) Inject 4 mg/kg into the vein every 28 (twenty-eight) days. For Rheumatoid Arthritis. Last ordered 06/22/20 x 2 doses   Yes [provider]  warfarin (COUMADIN) 3 MG tablet Take 1 tablet (3 mg total) by mouth at bedtime. Restart on 3/24 (48 hours after surgery) 03/22/21  Yes MacDiarmid, Nicki Reaper, MD  chlorhexidine (PERIDEX) 0.12 % solution SMARTSIG:By Mouth Patient not taking: Reported on 02/21/2022 10/30/21   [provider]    Inpatient Medications: Scheduled Meds:  Continuous Infusions:  PRN Meds:   Allergies:    Allergies  Allergen  Reactions   Orencia [Abatacept] Anaphylaxis    Had prostate cancer   Carbamazepine Rash    REACTION: makes drowsy BRAND NAME IS TEGRETOL   Celecoxib Itching and Rash    BRAND NAME IS CELEBREX   Cephalexin Rash    "Severe Rash".  BRAND NAME IS KEFLEX.    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   Smoking status: Former    Packs/day: 3.00    Years: 20.00    Pack years: 60.00    Types: Cigarettes    Quit date: 12/30/1996    Years since quitting: 25.2   Smokeless tobacco: Never  Vaping Use   Vaping Use: Never used  Substance and Sexual Activity   Alcohol use: No    Alcohol/week: 0.0 standard drinks   Drug use: No   Sexual activity: Yes  Other Topics Concern   Not on file  Social History Narrative   Not on file   Social Determinants of Health   Financial Resource Strain: Not on file  Food Insecurity: Not on file  Transportation Needs: Not on file  Physical Activity: Not on file  Stress: Not on file  Social Connections: Not on file  Intimate Partner Violence: Not on file    Family History:    Family History  Problem Relation Age of Onset   Skin cancer Father    Stomach cancer Father    Heart attack Father    Alzheimer's disease Mother    Throat cancer Brother    Stomach cancer Brother    Lung cancer Brother    Heart attack Brother    Heart attack Brother    Breast cancer Sister    Heart attack Sister  Stroke Sister    Bladder Cancer Sister    Heart murmur Sister    Colon cancer Brother    Colon polyps Neg Hx       ROS:  Please see the history of present illness.  Review of Systems  Constitutional:  Positive for malaise/fatigue.  Respiratory:  Positive for shortness of breath and wheezing.   Cardiovascular:  Positive for chest pain, orthopnea, leg swelling and PND. Negative for palpitations and claudication.  Gastrointestinal:  Negative for nausea and vomiting.  Genitourinary:  Negative for hematuria.  Musculoskeletal:  Negative for falls.  Neurological:  Negative for dizziness and loss of consciousness.   All other ROS reviewed and negative.     Physical Exam/Data:   Vitals:   03/11/22 1200 03/11/22 1300 03/11/22 1330 03/11/22 1400  BP: (!) 161/88 130/90 (!) 147/101 (!) 149/82  Pulse: 72 71 68 69  Resp: '16 19 14 17  '$ Temp:      TempSrc:      SpO2: 95% 95% 95% 93%  Weight:      Height:       No intake or output data in the 24 hours ending 03/11/22 1523 Last 3 Weights 03/11/2022 03/01/2022 02/21/2022  Weight (lbs) 230 lb 230 lb 230 lb 3.2 oz  Weight (kg) 104.327 kg 104.327 kg 104.418 kg     Body mass index is 33.97 kg/m.  General:  Sitting in bed, comfortable HEENT: normal Neck: no JVD Cardiac:  RR, 2/6 systolic murmur LUSB Lungs:  scattered expiratory wheezing Abd: soft, nontender Ext: 1+ LE edema, chronic venous stasis changes Musculoskeletal:  No deformities, BUE and BLE strength normal and equal Skin: chronic venous stasis changes Neuro:  CNs 2-12 intact, no focal abnormalities noted Psych:  Normal affect   EKG:  The EKG was personally reviewed and demonstrates:  NSR, no ischemic changes Telemetry:  Telemetry was personally reviewed and demonstrates:  NSR  Relevant CV Studies: TTE 03/11/22: IMPRESSIONS     1. Limited images.   2. The left ventricle has normal systolic function, with an ejection  fraction of 55-60%. The cavity size was normal. There is mildly increased  left ventricular wall thickness. Left ventricular diastolic parameters  were normal.   3. The right  ventricle has normal systolic function. The cavity was  normal. There is no increase in right ventricular wall thickness. Right  ventricular systolic pressure could not be assessed.   4. The aortic valve is tricuspid. Mild calcification of the aortic valve.  Mild to moderate aortic annular calcification noted.   5. The mitral valve is grossly normal. There is mild mitral annular  calcification present.   6. The aortic root is normal in size and structure.   7. The inferior vena cava was dilated in size with <50% respiratory  variability.   Laboratory Data:  High Sensitivity Troponin:   Recent Labs  Lab 03/11/22 1130 03/11/22 1301  TROPONINIHS 7 6     Chemistry Recent Labs  Lab 03/11/22 1130  NA 139  K 3.8  CL 102  CO2 27  GLUCOSE 120*  BUN 14  CREATININE 1.32*  CALCIUM 9.1  GFRNONAA 58*  ANIONGAP 10    No results for input(s): PROT, ALBUMIN, AST, ALT, ALKPHOS, BILITOT in the last 168 hours. Lipids No results for input(s): CHOL, TRIG, HDL, LABVLDL, LDLCALC, CHOLHDL in the last 168 hours.  Hematology Recent Labs  Lab 03/11/22 1130  WBC 5.0  RBC 5.04  HGB 15.3  HCT 46.3  MCV  91.9  MCH 30.4  MCHC 33.0  RDW 15.0  PLT 151   Thyroid No results for input(s): TSH, FREET4 in the last 168 hours.  BNPNo results for input(s): BNP, PROBNP in the last 168 hours.  DDimer No results for input(s): DDIMER in the last 168 hours.   Radiology/Studies:  DG Chest 2 View  Result Date: 03/11/2022 CLINICAL DATA:  Chest pain EXAM: CHEST - 2 VIEW COMPARISON:  Radiograph 11/21/2019 FINDINGS: Unchanged cardiomediastinal silhouette. Unchanged elevated left hemidiaphragm with left basilar atelectasis. No large pleural effusion. No visible pneumothorax. No acute osseous abnormality. Thoracic spondylosis. IMPRESSION: Unchanged elevated left hemidiaphragm with left basilar atelectasis. No new airspace disease. Electronically Signed   By: Maurine Simmering M.D.   On: 03/11/2022 12:09      Assessment and Plan:   #Chest Pain: Patient presents with substernal chest pressure and associated shortness of breath that woke him up last night. Has had several similar episodes over the past month that occur both with exertion and at rest and resolve with "sitting and relaxing." Has no known history of obstructive CAD, but was noted to have calcification on CT chest in 2020. Also with several risk factors for CAD including RA, HTN, HLD, prior tobacco use. Unfortunately, cannot do lexiscan at this time due to active wheezing on exam and not a great CTA candidate due to age, known calcification and CKD (would want to avoid double contrast if CTA positive). Discussed at length with the patient and given progressive nature of symptoms and risk factors, they elected to proceed with cath at this time. -Transfer to [facility] for cath -Cannot do lexiscan due to active wheezing -Check TTE -INR 1.4; hold warfarin -No BB due to active wheezing on exam -Will hold ARB prior to cath, can add as able post -Increase crestor to '20mg'$  daily  #Concern for HFpEF Exacerbation Patient with orthopnea and needing to sleep in a recliner for the past month as breathing is worse when laying flat. Last TTE in 2020 with preserved EF. CXR without significant pulmonary edema. Possibly some right sided dysfunction as well given known COPD and OSA. Will repeat TTE and check BNP. -Check BNP -Check TTE -Manage chest pain as above  #COPD: Patient wheezing on presentation now improved s/p nebs and IV steroids. -Management per IM  #History of DVT: On chronic warfarin. INR 1.4. Will hold in anticipation for cath. -Holding warfarin for cath -INR 1.4  #Chronic Venous Stasis: -On lasix '80mg'$  daily at home  #CKD IIIa: -Trend  #HLD: -Increase crestor to '20mg'$  daily  Risk Assessment/Risk Scores:     HEAR Score (for undifferentiated chest pain):  HEAR Score: 5{   New York Heart Association (NYHA) Functional Class NYHA  Class III        For questions or updates, please contact [facility] Please consult [email or web address] for contact info under    Signed, Freada Bergeron, MD  03/11/2022 3:23 PM

## 2022-03-11 NOTE — Assessment & Plan Note (Signed)
Does not appear to be in overt heart failure and is euvolemic ?Continue home Lasix and monitor daily weights ?

## 2022-03-11 NOTE — Assessment & Plan Note (Signed)
Continue leflunomide 

## 2022-03-11 NOTE — Assessment & Plan Note (Signed)
PPI ?

## 2022-03-11 NOTE — Assessment & Plan Note (Addendum)
Patient on Coumadin for prior DVT ?Hold Coumadin in anticipation for catheterization in a.m. ?Maintain on Lovenox for DVT prophylaxis ?

## 2022-03-11 NOTE — Assessment & Plan Note (Signed)
Continue allopurinol 

## 2022-03-11 NOTE — Assessment & Plan Note (Signed)
Continue to monitor creatinine levels ?

## 2022-03-11 NOTE — Telephone Encounter (Signed)
Contacted patient, he states that for the past 1 month he has had coming and going chest pain. He states it does not hurt when he takes a deep breath, or move a certain way, it is the same pain. He states that he has had some issues breathing recently, but was using his inhalers and treatments and it would get some better but would come back and he would begin to have difficulty breathing again. Patient denies arm pain, but does states his chest just feels uncomfortable. He states his legs are swelling but he takes a lasix and this seems to help. He also states for the past month his BP has been going up- last night it was 166/93, and now this morning while talking to me it was 183/117. I advised this could be related to the breathing treatments, however with his symptoms and complaints I would recommend he be seen in the ED. Patient will go to ED as he is not sure if this is related to his lungs or not. I advised I would route to MD to make aware.  ? ?Thanks! ?

## 2022-03-12 ENCOUNTER — Encounter (HOSPITAL_COMMUNITY)
Admission: EM | Disposition: A | Discharge status: Home / Self Care | Payer: Self-pay | Source: Home / Self Care | Attending: Internal Medicine

## 2022-03-12 ENCOUNTER — Inpatient Hospital Stay (HOSPITAL_COMMUNITY): Payer: Medicare Other

## 2022-03-12 DIAGNOSIS — I5032 Chronic diastolic (congestive) heart failure: Secondary | ICD-10-CM | POA: Diagnosis not present

## 2022-03-12 DIAGNOSIS — M069 Rheumatoid arthritis, unspecified: Secondary | ICD-10-CM | POA: Diagnosis not present

## 2022-03-12 DIAGNOSIS — Z7901 Long term (current) use of anticoagulants: Secondary | ICD-10-CM | POA: Diagnosis not present

## 2022-03-12 DIAGNOSIS — I200 Unstable angina: Secondary | ICD-10-CM

## 2022-03-12 DIAGNOSIS — R072 Precordial pain: Secondary | ICD-10-CM | POA: Diagnosis not present

## 2022-03-12 HISTORY — PX: LEFT HEART CATH AND CORONARY ANGIOGRAPHY: CATH118249

## 2022-03-12 LAB — BASIC METABOLIC PANEL
Anion gap: 10 (ref 5–15)
Anion gap: 10 (ref 5–15)
BUN: 18 mg/dL (ref 8–23)
BUN: 19 mg/dL (ref 8–23)
CO2: 26 mmol/L (ref 22–32)
CO2: 25 mmol/L (ref 22–32)
Calcium: 9.2 mg/dL (ref 8.9–10.3)
Calcium: 9.2 mg/dL (ref 8.9–10.3)
Chloride: 104 mmol/L (ref 98–111)
Chloride: 105 mmol/L (ref 98–111)
Creatinine, Ser: 1.52 mg/dL — ABNORMAL HIGH (ref 0.61–1.24)
Creatinine, Ser: 1.26 mg/dL — ABNORMAL HIGH (ref 0.61–1.24)
GFR, Estimated: 49 mL/min — ABNORMAL LOW (ref >60)
GFR, Estimated: >60 mL/min (ref >60)
Glucose, Bld: 135 mg/dL — ABNORMAL HIGH (ref 70–99)
Glucose, Bld: 101 mg/dL — ABNORMAL HIGH (ref 70–99)
Potassium: 4.1 mmol/L (ref 3.5–5.1)
Potassium: 4.3 mmol/L (ref 3.5–5.1)
Sodium: 140 mmol/L (ref 135–145)
Sodium: 140 mmol/L (ref 135–145)

## 2022-03-12 LAB — CBC
HCT: 43.2 % (ref 39.0–52.0)
Hemoglobin: 14.7 g/dL (ref 13.0–17.0)
MCH: 30.4 pg (ref 26.0–34.0)
MCHC: 34 g/dL (ref 30.0–36.0)
MCV: 89.4 fL (ref 80.0–100.0)
Platelets: 141 10*3/uL — ABNORMAL LOW (ref 150–400)
RBC: 4.83 MIL/uL (ref 4.22–5.81)
RDW: 14.7 % (ref 11.5–15.5)
WBC: 9 10*3/uL (ref 4.0–10.5)
nRBC: 0 % (ref 0.0–0.2)

## 2022-03-12 LAB — MAGNESIUM: Magnesium: 2 mg/dL (ref 1.7–2.4)

## 2022-03-12 SURGERY — LEFT HEART CATH AND CORONARY ANGIOGRAPHY
Anesthesia: LOCAL

## 2022-03-12 MED ORDER — HYDRALAZINE HCL 20 MG/ML IJ SOLN: 10.0000 mg | INTRAMUSCULAR | Status: AC | PRN

## 2022-03-12 MED ORDER — HEPARIN SODIUM (PORCINE) 1000 UNIT/ML IJ SOLN
INTRAMUSCULAR | Status: AC
  Filled 2022-03-12: qty 10

## 2022-03-12 MED ORDER — SODIUM CHLORIDE 0.9% FLUSH
3.0000 mL | Freq: Two times a day (BID) | INTRAVENOUS | Status: DC
  Administered 2022-03-12: 3 mL via INTRAVENOUS

## 2022-03-12 MED ORDER — SODIUM CHLORIDE 0.9 % WEIGHT BASED INFUSION
1.0000 mL/kg/h | INTRAVENOUS | Status: AC
  Administered 2022-03-12: 1 mL/kg/h via INTRAVENOUS

## 2022-03-12 MED ORDER — LIDOCAINE HCL (PF) 1 % IJ SOLN
INTRAMUSCULAR | Status: DC | PRN
  Administered 2022-03-12: 3 mL

## 2022-03-12 MED ORDER — SODIUM CHLORIDE 0.9 % WEIGHT BASED INFUSION
3.0000 mL/kg/h | INTRAVENOUS | Status: DC
  Administered 2022-03-12: 3 mL/kg/h via INTRAVENOUS

## 2022-03-12 MED ORDER — MIDAZOLAM HCL 2 MG/2ML IJ SOLN
INTRAMUSCULAR | Status: AC
Start: 2022-03-12 — End: ?
  Filled 2022-03-12: qty 2

## 2022-03-12 MED ORDER — FLUTICASONE FUROATE-VILANTEROL 100-25 MCG/ACT IN AEPB
1.0000 | INHALATION_SPRAY | Freq: Every day | RESPIRATORY_TRACT | Status: DC
  Administered 2022-03-12: 1 via RESPIRATORY_TRACT
  Filled 2022-03-12: qty 28

## 2022-03-12 MED ORDER — HEPARIN (PORCINE) IN NACL 1000-0.9 UT/500ML-% IV SOLN
INTRAVENOUS | Status: AC
  Filled 2022-03-12: qty 500

## 2022-03-12 MED ORDER — LABETALOL HCL 5 MG/ML IV SOLN: 10.0000 mg | INTRAVENOUS | Status: AC | PRN

## 2022-03-12 MED ORDER — LIDOCAINE HCL (PF) 1 % IJ SOLN
INTRAMUSCULAR | Status: AC
  Filled 2022-03-12: qty 30

## 2022-03-12 MED ORDER — ASPIRIN 81 MG PO CHEW
81.0000 mg | CHEWABLE_TABLET | ORAL | Status: AC
  Administered 2022-03-12: 81 mg via ORAL
  Filled 2022-03-12: qty 1

## 2022-03-12 MED ORDER — SODIUM CHLORIDE 0.9% FLUSH
3.0000 mL | Freq: Two times a day (BID) | INTRAVENOUS | Status: DC
  Administered 2022-03-13 (×2): 3 mL via INTRAVENOUS

## 2022-03-12 MED ORDER — HEPARIN SODIUM (PORCINE) 1000 UNIT/ML IJ SOLN
INTRAMUSCULAR | Status: DC | PRN
  Administered 2022-03-12: 5000 [IU] via INTRAVENOUS

## 2022-03-12 MED ORDER — VERAPAMIL HCL 2.5 MG/ML IV SOLN
INTRAVENOUS | Status: DC | PRN
  Administered 2022-03-12 (×2): 10 mL via INTRA_ARTERIAL

## 2022-03-12 MED ORDER — SODIUM CHLORIDE 0.9% FLUSH: 3.0000 mL | INTRAVENOUS | Status: DC | PRN

## 2022-03-12 MED ORDER — SODIUM CHLORIDE 0.9 % IV SOLN: 250.0000 mL | INTRAVENOUS | Status: DC | PRN

## 2022-03-12 MED ORDER — IOHEXOL 350 MG/ML SOLN
INTRAVENOUS | Status: DC | PRN
  Administered 2022-03-12: 40 mL

## 2022-03-12 MED ORDER — MIDAZOLAM HCL 2 MG/2ML IJ SOLN
INTRAMUSCULAR | Status: DC | PRN
  Administered 2022-03-12 (×2): 1 mg via INTRAVENOUS

## 2022-03-12 MED ORDER — FENTANYL CITRATE (PF) 100 MCG/2ML IJ SOLN
INTRAMUSCULAR | Status: AC
  Filled 2022-03-12: qty 2

## 2022-03-12 MED ORDER — FENTANYL CITRATE (PF) 100 MCG/2ML IJ SOLN
INTRAMUSCULAR | Status: DC | PRN
  Administered 2022-03-12 (×2): 25 ug via INTRAVENOUS

## 2022-03-12 MED ORDER — SODIUM CHLORIDE 0.9 % WEIGHT BASED INFUSION
1.0000 mL/kg/h | INTRAVENOUS | Status: DC
  Administered 2022-03-12: 1 mL/kg/h via INTRAVENOUS

## 2022-03-12 MED ORDER — HEPARIN (PORCINE) IN NACL 1000-0.9 UT/500ML-% IV SOLN
INTRAVENOUS | Status: DC | PRN
  Administered 2022-03-12 (×2): 500 mL

## 2022-03-12 MED ORDER — VERAPAMIL HCL 2.5 MG/ML IV SOLN
INTRAVENOUS | Status: AC
  Filled 2022-03-12: qty 2

## 2022-03-12 SURGICAL SUPPLY — 12 items
CATH INFINITI 5FR JL4 (CATHETERS) ×1 IMPLANT
CATH INFINITI JR4 5F (CATHETERS) ×1 IMPLANT
DEVICE RAD COMP TR BAND LRG (VASCULAR PRODUCTS) ×1 IMPLANT
GLIDESHEATH SLEND SS 6F .021 (SHEATH) ×1 IMPLANT
GUIDEWIRE INQWIRE 1.5J.035X260 (WIRE) IMPLANT
INQWIRE 1.5J .035X260CM (WIRE) ×2
KIT HEART LEFT (KITS) ×2 IMPLANT
PACK CARDIAC CATHETERIZATION (CUSTOM PROCEDURE TRAY) ×2 IMPLANT
SHEATH 6FR 85 DEST SLENDER (SHEATH) ×1 IMPLANT
TRANSDUCER W/STOPCOCK (MISCELLANEOUS) ×2 IMPLANT
TUBING CIL FLEX 10 FLL-RA (TUBING) ×2 IMPLANT
WIRE EMERALD 3MM-J .035X150CM (WIRE) ×1 IMPLANT

## 2022-03-12 NOTE — Progress Notes (Signed)
? ? ? [facility] ?                                                                            ? ? ?[name], is a 71 y.o. male, DOB - December 14, 1951, FYB:[id number] ?Admit date - 03/11/2022    ?Outpatient Primary MD for the patient is [name], MD ? ?LOS - 1  days ? ? ? ?Brief summary  ? ? ? [name] is a 71 y.o. male with medical history significant of COPD, prior DVT on warfarin, CKD stage IIIa ,OSA, gout, GERD, and RA who presented to the ED with chest pressure and shortness of breath upon awakening last night to use the restroom.  He has had intermittent episodes of substernal chest pressure over the last 1 month with no particular aggravating or alleviating factors . He was transferred to [facility] for [place].  ? ?Assessment & Plan  ? ? ?Assessment and Plan: ?* Chest pain ?Intermittent.  ?EKG does not shw any ischemic changes.  ?Troponins are negative.  ?Echocardiogram pending.  ?Plan for cardiac cath today.  ?On aspirin and crestor.  ? ?Stage 3a chronic kidney disease (CKD) ([place]) ?Baseline creatinine is between 1.3 to 1.5.  ?Continue to monitor.  ? ?Chronic diastolic HF (heart failure) ([place]) ?Does not appear to be in overt heart failure and is euvolemic ?Continue home Lasix and monitor daily weights ? ?History of COPD ?Patient initially noted to have some wheezing for which she received steroids and breathing treatment in ED ?No wheezing heard on exam.  ?Continue with bronchodilators.  ? ? ?Chronic idiopathic gout involving toe without tophus ?Continue allopurinol.  ? ?Rheumatoid arthritis ([place]) ?Continue leflunomide ? ?Chronic anticoagulation ?Patient is on coumadin for DVT, on hold for LHC.  ? ?GERD ?PPI ? ?Hyperlipidemia:  ?Continue with crestor.  ? ? ?  ? ?  ? ?Estimated body mass index is 33.53 kg/m? as calculated from the following: ?  Height as of this encounter: '5\' 9"'$  (1.753 m). ?  Weight as of this encounter: 103 kg. ? ?Code Status: full code.  ?DVT Prophylaxis:  lovenox.  ? ? ?Level of  Care: Level of care: Telemetry Cardiac ?Family Communication: family at bedside.  ? ?Disposition Plan:     Remains inpatient appropriate:  LHC today.  ? ?Procedures:  ?Scheduled for LHC today.  ? ?Consultants:   ?Cardiology.  ? ?Antimicrobials:  ? ?Anti-infectives (From admission, onward)  ? ? None  ? ?  ? ? ? ?Medications ? ?Scheduled Meds: ? allopurinol  100 mg Oral BID  ? dicyclomine  20 mg Oral BID  ? enoxaparin (LOVENOX) injection  50 mg Subcutaneous Q24H  ? fluticasone furoate-vilanterol  1 puff Inhalation Q2000  ? furosemide  40 mg Oral BID  ? leflunomide  20 mg Oral Daily  ? oxybutynin  10 mg Oral Daily  ? pantoprazole  40 mg Oral Daily  ? phenytoin  100 mg Oral Daily  ? phenytoin  200 mg Oral QHS  ? potassium chloride SA  20 mEq Oral Daily  ? rosuvastatin  20 mg Oral Daily  ? sodium chloride flush  3 mL Intravenous Q12H  ? sodium chloride flush  3 mL  Intravenous Q12H  ? umeclidinium bromide  1 puff Inhalation Daily  ? ?Continuous Infusions: ? sodium chloride    ? sodium chloride    ? sodium chloride 1 mL/kg/hr (03/12/22 0958)  ? ?PRN Meds:.sodium chloride, sodium chloride, acetaminophen **OR** acetaminophen, albuterol, ondansetron **OR** ondansetron (ZOFRAN) IV, sodium chloride flush, sodium chloride flush ? ? ? ?Subjective:  ? ?[name] was seen and examined today.  No chest pain overnight.  ? ?Objective:  ? ?Vitals:  ? 03/11/22 2018 03/11/22 2354 03/12/22 0430 03/12/22 0904  ?BP: (!) 153/85 (!) 156/80 124/66   ?Pulse: 91 90 78 74  ?Resp:  '18 18 18  '$ ?Temp: 98.2 ?F (36.8 ?C) 98.2 ?F (36.8 ?C) 98.2 ?F (36.8 ?C)   ?TempSrc: Oral Oral Oral   ?SpO2: 95% 95% 96% 97%  ?Weight:   103 kg   ?Height:      ? ? ?Intake/Output Summary (Last 24 hours) at 03/12/2022 1057 ?Last data filed at 03/12/2022 0600 ?Gross per 24 hour  ?Intake 240 ml  ?Output 400 ml  ?Net -160 ml  ? ?Filed Weights  ? 03/11/22 1117 03/11/22 2017 03/12/22 0430  ?Weight: 104.3 kg 103.4 kg 103 kg  ? ? ? ?Exam ?General exam: Appears calm and  comfortable  ?Respiratory system: Clear to auscultation. Respiratory effort normal. ?Cardiovascular system: S1 & S2 heard, RRR. No JVD,  No pedal edema. ?Gastrointestinal system: Abdomen is nondistended, soft and nontender.  Normal bowel sounds heard. ?Central nervous system: Alert and oriented. No focal neurological deficits. ?Extremities: Symmetric 5 x 5 power. ?Skin: No rashes, lesions or ulcers ?Psychiatry: mood is appropriate.  ? ? ?Data Reviewed:  I have personally reviewed following labs and imaging studies ? ? ?CBC ?Lab Results  ?Component Value Date  ? WBC 9.0 03/12/2022  ? RBC 4.83 03/12/2022  ? HGB 14.7 03/12/2022  ? HCT 43.2 03/12/2022  ? MCV 89.4 03/12/2022  ? MCH 30.4 03/12/2022  ? PLT 141 (L) 03/12/2022  ? MCHC 34.0 03/12/2022  ? RDW 14.7 03/12/2022  ? LYMPHSABS 1.2 02/01/2022  ? MONOABS 0.7 02/01/2022  ? EOSABS 0.4 02/01/2022  ? BASOSABS 0.1 02/01/2022  ? ? ? ?Last metabolic panel ?Lab Results  ?Component Value Date  ? NA 140 03/12/2022  ? K 4.1 03/12/2022  ? CL 104 03/12/2022  ? CO2 26 03/12/2022  ? BUN 18 03/12/2022  ? CREATININE 1.52 (H) 03/12/2022  ? GLUCOSE 135 (H) 03/12/2022  ? GFRNONAA 49 (L) 03/12/2022  ? GFRAA >60 09/20/2020  ? CALCIUM 9.2 03/12/2022  ? PHOS 2.4 (L) 06/07/2019  ? PROT 6.5 02/01/2022  ? ALBUMIN 4.0 02/01/2022  ? LABGLOB 2.8 11/25/2016  ? AGRATIO 1.4 11/25/2016  ? BILITOT 1.1 02/01/2022  ? ALKPHOS 64 02/01/2022  ? AST 37 02/01/2022  ? ALT 27 02/01/2022  ? ANIONGAP 10 03/12/2022  ? ? ?CBG (last 3)  ?No results for input(s): GLUCAP in the last 72 hours.  ? ? ?Coagulation Profile: ?Recent Labs  ?Lab 03/11/22 ?1130  ?INR 1.4*  ? ? ? ?Radiology Studies: ?DG Chest 2 View ? ?Result Date: 03/11/2022 ?CLINICAL DATA:  Chest pain EXAM: CHEST - 2 VIEW COMPARISON:  Radiograph 11/21/2019 FINDINGS: Unchanged cardiomediastinal silhouette. Unchanged elevated left hemidiaphragm with left basilar atelectasis. No large pleural effusion. No visible pneumothorax. No acute osseous abnormality.  Thoracic spondylosis. IMPRESSION: Unchanged elevated left hemidiaphragm with left basilar atelectasis. No new airspace disease. Electronically Signed   By: Maurine Simmering M.D.   On: 03/11/2022 12:09   ? ? ? ? ?  Hosie Poisson M.D. ?[facility] ?03/12/2022, 10:57 AM ? ?Available via Epic secure chat 7am-7pm ?After 7 pm, please refer to night coverage provider listed on amion. ? ? ? ?

## 2022-03-12 NOTE — Plan of Care (Signed)

## 2022-03-12 NOTE — TOC Initial Note (Signed)
Transition of Care (TOC) - Initial/Assessment Note  ? ? ?Patient Details  ?Name: SEVERUS BRODZINSKI ?MRN: [id number] ?Date of Birth: 17-Jul-1951 ? ?Transition of Care [facility]) CM/SW Contact:    ?Ninfa Meeker, RN ?Phone Number: ?03/12/2022, 12:33 PM ? ?Clinical Narrative:                 ?Transition of Care Screening Note: ? ?Transition of Care [facility]) Department has reviewed patient and no TOC needs have been identified at this time. We will continue to monitor patient advancement through Interdisciplinary progressions and if new patient needs arise, please place a consult.  ? ?  ?  ? ? ?Patient Goals and CMS Choice ?  ?  ?  ? ?Expected Discharge Plan and Services ?  ?  ?  ?  ?  ?                ?  ?  ?  ?  ?  ?  ?  ?  ?  ?  ? ?Prior Living Arrangements/Services ?  ?  ?  ?       ?  ?  ?  ?  ? ?Activities of Daily Living ?  ?  ? ?Permission Sought/Granted ?  ?  ?   ?   ?   ?   ? ?Emotional Assessment ?  ?  ?  ?  ?  ?  ? ?Admission diagnosis:  Chest pain [R07.9] ?Chest pain, unspecified type [R07.9] ?Patient Active Problem List  ? Diagnosis Date Noted  ? Chest pain 03/11/2022  ? Stage 3a chronic kidney disease (CKD) ([place]) 03/11/2022  ? Bronchiectasis without complication ([place]) 24/23/5361  ? Incontinence when straining, male 03/20/2021  ? Erectile dysfunction after radical prostatectomy 09/08/2020  ? OAB (overactive bladder) 02/23/2020  ? Class 2 severe obesity due to excess calories with serious comorbidity and body mass index (BMI) of 35.0 to 35.9 in adult [facility])   ? Chronic diastolic HF (heart failure) ([place])   ? Serratia sepsis ([place]) 06/08/2019  ? Bacteremia due to Gram-negative bacteria 06/08/2019  ? Voice hoarseness 04/26/2019  ? OSA (obstructive sleep apnea) 03/10/2019  ? Abnormal CT of the chest 02/09/2019  ? Dyspnea on exertion 10/22/2017  ? History of seizure disorder 02/27/2017  ? Primary osteoarthritis of both hands 02/27/2017  ? Primary osteoarthritis of both feet 02/27/2017  ? Idiopathic gout of multiple sites  02/27/2017  ? High risk medication use 12/29/2016  ? History of prostate cancer 12/29/2016  ? Primary osteoarthritis of both knees 12/29/2016  ? Chronic idiopathic gout involving toe without tophus 12/29/2016  ? History of CHF (congestive heart failure) 12/29/2016  ? History of COPD 12/29/2016  ? Plantar pustular psoriasis 12/29/2016  ? DVT (deep venous thrombosis) ([place]) 10/31/2016  ? COPD with acute exacerbation ([place]) 10/31/2016  ? CHF (congestive heart failure) ([place]) 10/31/2016  ? Prostate cancer ([place]) 08/30/2015  ? Malignant neoplasm of prostate ([place]) 07/04/2015  ? Chest pain at rest 07/05/2014  ? Weakness 07/05/2014  ? Complicated postphlebitic syndrome 11/16/2013  ? Personal history of DVT (deep vein thrombosis) 05/18/2013  ? Chronic anticoagulation 05/18/2013  ? Diverticulosis of colon without hemorrhage 05/18/2013  ? Rheumatoid arthritis ([place]) 05/18/2013  ? Seizure disorder ([place]) 05/18/2013  ? Hx of adenomatous colonic polyps 05/18/2013  ? GERD 05/08/2010  ? NAUSEA 05/08/2010  ? FLATULENCE-GAS-BLOATING 05/08/2010  ? ?PCP:  [name], MD ?Pharmacy:   ?[id number] [place] [place] [address] ?[place] ?[place] [address] ?Phone: [phone] Fax: [phone] ? ?[facility] [place] [place] [facility] ?[address] [facility] ?[place] [address] ?Phone: [phone] Fax: [phone] ? ?[place] ?[place] [address] ?Phone: [phone] Fax: [phone] ? ? ? ? ?Social Determinants of Health (SDOH) Interventions ?  ? ?Readmission Risk Interventions ?No flowsheet data found. ? ? ?

## 2022-03-12 NOTE — Progress Notes (Signed)
TR band off.  

## 2022-03-12 NOTE — Interval H&P Note (Signed)
History and Physical Interval Note: ? ?03/12/2022 ?3:06 PM ? ?[name]  has presented today for surgery, with the diagnosis of nstemi.  The various methods of treatment have been discussed with the patient and family. After consideration of risks, benefits and other options for treatment, the patient has consented to  Procedure(s): ?LEFT HEART CATH AND CORONARY ANGIOGRAPHY (N/A) as a surgical intervention.  The patient's history has been reviewed, patient examined, no change in status, stable for surgery.  I have reviewed the patient's chart and labs.  Questions were answered to the patient's satisfaction.   ? ? ?Sherren Mocha ? ? ?

## 2022-03-12 NOTE — H&P (View-Only) (Signed)
? ?Progress Note ? ?Patient Name: [name] ?Date of Encounter: 03/12/2022 ? ?[facility] Cardiologist: [name], MD  ? ?Subjective  ? ?Feeling well. No chest pain, sob or palpitations.   ? ?Inpatient Medications  ?  ?Scheduled Meds: ? allopurinol  100 mg Oral BID  ? dicyclomine  20 mg Oral BID  ? enoxaparin (LOVENOX) injection  50 mg Subcutaneous Q24H  ? fluticasone furoate-vilanterol  1 puff Inhalation Q2000  ? furosemide  40 mg Oral BID  ? leflunomide  20 mg Oral Daily  ? oxybutynin  10 mg Oral Daily  ? pantoprazole  40 mg Oral Daily  ? phenytoin  100 mg Oral Daily  ? phenytoin  200 mg Oral QHS  ? potassium chloride SA  20 mEq Oral Daily  ? rosuvastatin  20 mg Oral Daily  ? sodium chloride flush  3 mL Intravenous Q12H  ? sodium chloride flush  3 mL Intravenous Q12H  ? umeclidinium bromide  1 puff Inhalation Daily  ? ?Continuous Infusions: ? sodium chloride    ? sodium chloride    ? sodium chloride 3 mL/kg/hr (03/12/22 0853)  ? Followed by  ? sodium chloride    ? ?PRN Meds: ?sodium chloride, sodium chloride, acetaminophen **OR** acetaminophen, albuterol, ondansetron **OR** ondansetron (ZOFRAN) IV, sodium chloride flush, sodium chloride flush  ? ?Vital Signs  ?  ?Vitals:  ? 03/11/22 2018 03/11/22 2354 03/12/22 0430 03/12/22 0904  ?BP: (!) 153/85 (!) 156/80 124/66   ?Pulse: 91 90 78 74  ?Resp:  '18 18 18  '$ ?Temp: 98.2 ?F (36.8 ?C) 98.2 ?F (36.8 ?C) 98.2 ?F (36.8 ?C)   ?TempSrc: Oral Oral Oral   ?SpO2: 95% 95% 96% 97%  ?Weight:   103 kg   ?Height:      ? ? ?Intake/Output Summary (Last 24 hours) at 03/12/2022 0931 ?Last data filed at 03/12/2022 0600 ?Gross per 24 hour  ?Intake 240 ml  ?Output 400 ml  ?Net -160 ml  ? ?Last 3 Weights 03/12/2022 03/11/2022 03/11/2022  ?Weight (lbs) 227 lb 1.2 oz 228 lb 230 lb  ?Weight (kg) 103 kg 103.42 kg 104.327 kg  ?   ? ?Telemetry  ?  ?NSR - Personally Reviewed ? ?ECG  ?  ?No new tracing  ? ?Physical Exam  ? ?GEN: No acute distress.   ?Neck: No JVD ?Cardiac: RRR, no murmurs,  rubs, or gallops.  ?Respiratory: Clear to auscultation bilaterally. ?GI: Soft, nontender, non-distended  ?MS: No edema; No deformity. ?Neuro:  Nonfocal  ?Psych: Normal affect  ? ?Labs  ?  ?High Sensitivity Troponin:   ?Recent Labs  ?Lab 03/11/22 ?1130 03/11/22 ?1301  ?TROPONINIHS 7 6  ?   ?Chemistry ?Recent Labs  ?Lab 03/11/22 ?1130 03/12/22 ?0328  ?NA 139 140  ?K 3.8 4.1  ?CL 102 104  ?CO2 27 26  ?GLUCOSE 120* 135*  ?BUN 14 18  ?CREATININE 1.32* 1.52*  ?CALCIUM 9.1 9.2  ?MG  --  2.0  ?GFRNONAA 58* 49*  ?ANIONGAP 10 10  ?  ?Hematology ?Recent Labs  ?Lab 03/11/22 ?1130 03/12/22 ?0328  ?WBC 5.0 9.0  ?RBC 5.04 4.83  ?HGB 15.3 14.7  ?HCT 46.3 43.2  ?MCV 91.9 89.4  ?MCH 30.4 30.4  ?MCHC 33.0 34.0  ?RDW 15.0 14.7  ?PLT 151 141*  ? ?BNP ?Recent Labs  ?Lab 03/11/22 ?1130  ?BNP 38.0  ?  ? ?Radiology  ?  ?DG Chest 2 View ? ?Result Date: 03/11/2022 ?CLINICAL DATA:  Chest pain EXAM: CHEST -  2 VIEW COMPARISON:  Radiograph 11/21/2019 FINDINGS: Unchanged cardiomediastinal silhouette. Unchanged elevated left hemidiaphragm with left basilar atelectasis. No large pleural effusion. No visible pneumothorax. No acute osseous abnormality. Thoracic spondylosis. IMPRESSION: Unchanged elevated left hemidiaphragm with left basilar atelectasis. No new airspace disease. Electronically Signed   By: Maurine Simmering M.D.   On: 03/11/2022 12:09   ? ?Cardiac Studies  ? ?Pending cath and echo ? ?Patient Profile  ?   ?71 y.o. male with a hx of RA, COPD, chronic LE edema in the setting of chronic venous stasis, CKD III prior DVT on warfarin, and OSA who is being seen for the evaluation of chest pain at the request of Dr. Manuella Ghazi. ? ?Assessment & Plan  ?  ?Chest pain  ?- Chest pain free currently. Troponin negative. For cath today.  ?- Continue statin (Crestor increased to '20mg'$  this admission) ? ?2. Concern for HFpEF Exacerbation ?Patient with orthopnea and needing to sleep in a recliner for the past month as breathing is worse when laying flat. Last TTE in  2020 with preserved EF. CXR without significant pulmonary edema. Possibly some right sided dysfunction as well given known COPD and OSA. BNP normal. Pending cath and echo. Check LVEDP.  ?- lasix on hold for cath  ? ?3. COPD: ?Patient wheezing on presentation now improved s/p nebs and IV steroids. ?-Management per IM ?  ?4. History of DVT: ?- On chronic warfarin. INR 1.4. Held for cath. ? ?5. Chronic Venous Stasis: ?-On lasix at home ?  ?6. CKD IIIa: ?-Trend. Seems baseline SCr 1.2-1.3 ?- SCr 1.32>>1.52 ?- Follow closely  ?  ?7. HLD: ?-Increased crestor to '20mg'$  daily ? ? ?For questions or updates, please contact [place] ?Please consult [email or web address] for contact info under  ? ?  ?   ?Signed, ?Leanor Kail, PA  ?03/12/2022, 9:31 AM    ?

## 2022-03-12 NOTE — Progress Notes (Signed)
Mobility Specialist Progress Note: ? ? 03/12/22 1400  ?Mobility  ?Activity Ambulated with assistance in hallway  ?Level of Assistance Independent  ?Assistive Device None  ?Distance Ambulated (ft) 500 ft  ?Activity Response Tolerated well  ?$Mobility charge 1 Mobility  ? ?Pt asx during ambulation. Back in chair with all needs met.  ? ?Nelta Numbers ?Acute Rehab ?Phone: [phone] ?Office Phone: [phone] ? ?

## 2022-03-12 NOTE — Consult Note (Signed)
? ?Progress Note ? ?Patient Name: [name] ?Date of Encounter: 03/12/2022 ? ?[facility] Cardiologist: [name], MD  ? ?Subjective  ? ?Feeling well. No chest pain, sob or palpitations.   ? ?Inpatient Medications  ?  ?Scheduled Meds: ? allopurinol  100 mg Oral BID  ? dicyclomine  20 mg Oral BID  ? enoxaparin (LOVENOX) injection  50 mg Subcutaneous Q24H  ? fluticasone furoate-vilanterol  1 puff Inhalation Q2000  ? furosemide  40 mg Oral BID  ? leflunomide  20 mg Oral Daily  ? oxybutynin  10 mg Oral Daily  ? pantoprazole  40 mg Oral Daily  ? phenytoin  100 mg Oral Daily  ? phenytoin  200 mg Oral QHS  ? potassium chloride SA  20 mEq Oral Daily  ? rosuvastatin  20 mg Oral Daily  ? sodium chloride flush  3 mL Intravenous Q12H  ? sodium chloride flush  3 mL Intravenous Q12H  ? umeclidinium bromide  1 puff Inhalation Daily  ? ?Continuous Infusions: ? sodium chloride    ? sodium chloride    ? sodium chloride 3 mL/kg/hr (03/12/22 0853)  ? Followed by  ? sodium chloride    ? ?PRN Meds: ?sodium chloride, sodium chloride, acetaminophen **OR** acetaminophen, albuterol, ondansetron **OR** ondansetron (ZOFRAN) IV, sodium chloride flush, sodium chloride flush  ? ?Vital Signs  ?  ?Vitals:  ? 03/11/22 2018 03/11/22 2354 03/12/22 0430 03/12/22 0904  ?BP: (!) 153/85 (!) 156/80 124/66   ?Pulse: 91 90 78 74  ?Resp:  '18 18 18  '$ ?Temp: 98.2 ?F (36.8 ?C) 98.2 ?F (36.8 ?C) 98.2 ?F (36.8 ?C)   ?TempSrc: Oral Oral Oral   ?SpO2: 95% 95% 96% 97%  ?Weight:   103 kg   ?Height:      ? ? ?Intake/Output Summary (Last 24 hours) at 03/12/2022 0931 ?Last data filed at 03/12/2022 0600 ?Gross per 24 hour  ?Intake 240 ml  ?Output 400 ml  ?Net -160 ml  ? ?Last 3 Weights 03/12/2022 03/11/2022 03/11/2022  ?Weight (lbs) 227 lb 1.2 oz 228 lb 230 lb  ?Weight (kg) 103 kg 103.42 kg 104.327 kg  ?   ? ?Telemetry  ?  ?NSR - Personally Reviewed ? ?ECG  ?  ?No new tracing  ? ?Physical Exam  ? ?GEN: No acute distress.   ?Neck: No JVD ?Cardiac: RRR, no murmurs,  rubs, or gallops.  ?Respiratory: Clear to auscultation bilaterally. ?GI: Soft, nontender, non-distended  ?MS: No edema; No deformity. ?Neuro:  Nonfocal  ?Psych: Normal affect  ? ?Labs  ?  ?High Sensitivity Troponin:   ?Recent Labs  ?Lab 03/11/22 ?1130 03/11/22 ?1301  ?TROPONINIHS 7 6  ?   ?Chemistry ?Recent Labs  ?Lab 03/11/22 ?1130 03/12/22 ?0328  ?NA 139 140  ?K 3.8 4.1  ?CL 102 104  ?CO2 27 26  ?GLUCOSE 120* 135*  ?BUN 14 18  ?CREATININE 1.32* 1.52*  ?CALCIUM 9.1 9.2  ?MG  --  2.0  ?GFRNONAA 58* 49*  ?ANIONGAP 10 10  ?  ?Hematology ?Recent Labs  ?Lab 03/11/22 ?1130 03/12/22 ?0328  ?WBC 5.0 9.0  ?RBC 5.04 4.83  ?HGB 15.3 14.7  ?HCT 46.3 43.2  ?MCV 91.9 89.4  ?MCH 30.4 30.4  ?MCHC 33.0 34.0  ?RDW 15.0 14.7  ?PLT 151 141*  ? ?BNP ?Recent Labs  ?Lab 03/11/22 ?1130  ?BNP 38.0  ?  ? ?Radiology  ?  ?DG Chest 2 View ? ?Result Date: 03/11/2022 ?CLINICAL DATA:  Chest pain EXAM: CHEST -  2 VIEW COMPARISON:  Radiograph 11/21/2019 FINDINGS: Unchanged cardiomediastinal silhouette. Unchanged elevated left hemidiaphragm with left basilar atelectasis. No large pleural effusion. No visible pneumothorax. No acute osseous abnormality. Thoracic spondylosis. IMPRESSION: Unchanged elevated left hemidiaphragm with left basilar atelectasis. No new airspace disease. Electronically Signed   By: Maurine Simmering M.D.   On: 03/11/2022 12:09   ? ?Cardiac Studies  ? ?Pending cath and echo ? ?Patient Profile  ?   ?71 y.o. male with a hx of RA, COPD, chronic LE edema in the setting of chronic venous stasis, CKD III prior DVT on warfarin, and OSA who is being seen for the evaluation of chest pain at the request of Dr. Manuella Ghazi. ? ?Assessment & Plan  ?  ?Chest pain  ?- Chest pain free currently. Troponin negative. For cath today.  ?- Continue statin (Crestor increased to '20mg'$  this admission) ? ?2. Concern for HFpEF Exacerbation ?Patient with orthopnea and needing to sleep in a recliner for the past month as breathing is worse when laying flat. Last TTE in  2020 with preserved EF. CXR without significant pulmonary edema. Possibly some right sided dysfunction as well given known COPD and OSA. BNP normal. Pending cath and echo. Check LVEDP.  ?- lasix on hold for cath  ? ?3. COPD: ?Patient wheezing on presentation now improved s/p nebs and IV steroids. ?-Management per IM ?  ?4. History of DVT: ?- On chronic warfarin. INR 1.4. Held for cath. ? ?5. Chronic Venous Stasis: ?-On lasix at home ?  ?6. CKD IIIa: ?-Trend. Seems baseline SCr 1.2-1.3 ?- SCr 1.32>>1.52 ?- Follow closely  ?  ?7. HLD: ?-Increased crestor to '20mg'$  daily ? ? ?For questions or updates, please contact [place] ?Please consult [email or web address] for contact info under  ? ?  ?   ?Signed, ?Leanor Kail, PA  ?03/12/2022, 9:31 AM    ?

## 2022-03-13 ENCOUNTER — Inpatient Hospital Stay (HOSPITAL_COMMUNITY): Payer: Medicare Other

## 2022-03-13 ENCOUNTER — Encounter (HOSPITAL_COMMUNITY): Payer: Self-pay | Admitting: Cardiovascular Disease

## 2022-03-13 DIAGNOSIS — Z7901 Long term (current) use of anticoagulants: Secondary | ICD-10-CM | POA: Diagnosis not present

## 2022-03-13 DIAGNOSIS — R079 Chest pain, unspecified: Secondary | ICD-10-CM | POA: Diagnosis not present

## 2022-03-13 DIAGNOSIS — M1A0790 Idiopathic chronic gout, unspecified ankle and foot, without tophus (tophi): Secondary | ICD-10-CM | POA: Diagnosis not present

## 2022-03-13 DIAGNOSIS — I5032 Chronic diastolic (congestive) heart failure: Secondary | ICD-10-CM | POA: Diagnosis not present

## 2022-03-13 DIAGNOSIS — R072 Precordial pain: Secondary | ICD-10-CM | POA: Diagnosis not present

## 2022-03-13 LAB — CBC
HCT: 43.4 % (ref 39.0–52.0)
Hemoglobin: 14.5 g/dL (ref 13.0–17.0)
MCH: 30.5 pg (ref 26.0–34.0)
MCHC: 33.4 g/dL (ref 30.0–36.0)
MCV: 91.4 fL (ref 80.0–100.0)
Platelets: 127 10*3/uL — ABNORMAL LOW (ref 150–400)
RBC: 4.75 MIL/uL (ref 4.22–5.81)
RDW: 15.2 % (ref 11.5–15.5)
WBC: 6.8 10*3/uL (ref 4.0–10.5)
nRBC: 0 % (ref 0.0–0.2)

## 2022-03-13 LAB — BASIC METABOLIC PANEL
Anion gap: 12 (ref 5–15)
BUN: 16 mg/dL (ref 8–23)
CO2: 25 mmol/L (ref 22–32)
Calcium: 8.6 mg/dL — ABNORMAL LOW (ref 8.9–10.3)
Chloride: 104 mmol/L (ref 98–111)
Creatinine, Ser: 1.37 mg/dL — ABNORMAL HIGH (ref 0.61–1.24)
GFR, Estimated: 55 mL/min — ABNORMAL LOW (ref >60)
Glucose, Bld: 109 mg/dL — ABNORMAL HIGH (ref 70–99)
Potassium: 3.7 mmol/L (ref 3.5–5.1)
Sodium: 141 mmol/L (ref 135–145)

## 2022-03-13 LAB — ECHOCARDIOGRAM COMPLETE
Area-P 1/2: 3.91 cm2
Calc EF: 59.9 %
Height: 69 in
S' Lateral: 3.2 cm
Single Plane A2C EF: 56.5 %
Single Plane A4C EF: 59.5 %
Weight: 3708.8 oz

## 2022-03-13 LAB — PROTIME-INR
INR: 1.3 — ABNORMAL HIGH (ref 0.8–1.2)
Prothrombin Time: 16.1 seconds — ABNORMAL HIGH (ref 11.4–15.2)

## 2022-03-13 MED ORDER — AMLODIPINE BESYLATE 5 MG PO TABS: 5.0000 mg | ORAL_TABLET | Freq: Every day | ORAL | 0 refills | Status: DC

## 2022-03-13 MED ORDER — FUROSEMIDE 40 MG PO TABS: 80.0000 mg | ORAL_TABLET | Freq: Two times a day (BID) | ORAL | Status: DC

## 2022-03-13 MED ORDER — FUROSEMIDE 80 MG PO TABS: 80.0000 mg | ORAL_TABLET | Freq: Two times a day (BID) | ORAL | 0 refills | Status: DC

## 2022-03-13 MED ORDER — PREDNISONE 20 MG PO TABS: 40.0000 mg | ORAL_TABLET | Freq: Every day | ORAL | 0 refills | Status: DC

## 2022-03-13 MED ORDER — AMLODIPINE BESYLATE 5 MG PO TABS
5.0000 mg | ORAL_TABLET | Freq: Every day | ORAL | Status: DC
  Administered 2022-03-13: 5 mg via ORAL
  Filled 2022-03-13: qty 1

## 2022-03-13 NOTE — Discharge Summary (Signed)
Physician Discharge Summary   Patient: [name] MRN: [id number] DOB: Feb 16, 1951  Admit date:     03/11/2022  Discharge date: 03/13/22  Discharge Physician: Tyrone Nine   PCP: Assunta Found, MD   Recommendations at discharge:  Between being chronically on phenytoin, allopurinol and coumadin as well as immunosuppressants for RA, this patient is at exceedingly high risk for medication interactions. No change to his coumadin dosing was recommended.  Please follow up blood pressure. Norvasc 5mg  was started in the hospital and continued at discharge. Monitor LE edema (which is multifactorial) with this as well.  Repeat clinical volume assessment and BMP is recommended within the next week due to increase of lasix dosing to 80mg  po BID.  Patient has cardiology follow up already secured on 4/20. Recommend pulmonary follow up, treated for mild COPD exacerbation with decadron while admitted, prednisone burst to be completed after discharge.   Discharge Diagnoses: Principal Problem:   Chest pain Active Problems:   GERD   Chronic anticoagulation   Rheumatoid arthritis (HCC)   Chronic idiopathic gout involving toe without tophus   History of COPD   Chronic diastolic HF (heart failure) (HCC)   Stage 3a chronic kidney disease (CKD) [facility])  Hospital Course: [name] is a 71 y.o. male with a history of bilateral LE DVTs on coumadin prophylaxis long term, COPD, RA on immunosuppression, stage IIIa CKD, OSA, gout who presented to the ED 3/13 with recurrent intermittent episodes of chest pressure and shortness of breath, often times worse at night. He was admitted from [facility] to [facility] for cardiology consultation. Left cardiac catheterization was performed 3/14 showing no obstructive disease. Echocardiogram subsequently revealed normal LV systolic function and no regional wall motion abnormalities. He was given breathing treatments and steroids for wheezing with dyspnea intermittently and has  overall improved. He has been cleared for discharge by cardiology and is ambulating without dyspnea or chest discomfort on the day of discharge. See below for full details.  Assessment and Plan: * Chest pain: Nonischemic based on clean cors at cath and unremarkable echocardiogram. Suspect an element of orthopnea/PND based on history.   Stage 3a chronic kidney disease (CKD) (HCC) Continue to monitor creatinine levels at follow up  Chronic diastolic HF (heart failure) (HCC) - Treat HTN with norvasc (newly added) - Increase lasix dose. The patient's renal function has stabilized and volume status improved with higher dose IV lasix, transition to 80mg  po BID at discharge with plans to follow up within a week.  COPD with mild acute exacerbation: May be contributing to dyspnea symptoms.  - Give short burst of prednisone (not on steroids chronically), continue breathing treatments (has neb at home) around the clock for 48 hours, then prn. - Continue home bronchodilators and follow up with pulmonary after discharge.    Chronic idiopathic gout involving toe without tophus Continue allopurinol  Rheumatoid arthritis (HCC) Continue leflunomide, gets actemra as outpatient as well  Chronic anticoagulation, history of bilateral extensive LE DVTs:  Continue coumadin at previous dose per cardiology post-cath recommendations. No bridging (was discussed with patient) due to no active clot and risk of bleeding.  GERD PPI  Consultants: Cardiology Procedures performed: [facility] 03/12/2022  Disposition: Home Diet recommendation:  Discharge Diet Orders (From admission, onward)     Start     Ordered   03/13/22 0000  Diet - low sodium heart healthy        03/13/22 1633           Cardiac  diet DISCHARGE MEDICATION: Allergies as of 03/13/2022       Reactions   Orencia [abatacept] Anaphylaxis   Had prostate cancer   Carbamazepine Rash   REACTION: makes drowsy BRAND NAME IS TEGRETOL   Celecoxib  Itching, Rash   BRAND NAME IS CELEBREX   Cephalexin Rash   "Severe Rash".  BRAND NAME IS KEFLEX.    Enbrel [etanercept] Rash   Humira [adalimumab] Rash   Levofloxacin Other (See Comments)   REACTION: GI Intolerance BRAND NAME IS LEVAQUIN   Sulfa Antibiotics Rash   Sulfasalazine Rash        Medication List     TAKE these medications    acetaminophen 650 MG CR tablet Commonly known as: TYLENOL Take 1,300 mg by mouth every 8 (eight) hours as needed for pain.   ACTEMRA IV Inject 4 mg/kg into the vein every 28 (twenty-eight) days. For Rheumatoid Arthritis. Last ordered 06/22/20 x 2 doses   albuterol (2.5 MG/3ML) 0.083% nebulizer solution Commonly known as: PROVENTIL Take 3 mLs (2.5 mg total) by nebulization every 6 (six) hours as needed for wheezing or shortness of breath.   allopurinol 100 MG tablet Commonly known as: ZYLOPRIM TAKE 1 TABLET BY MOUTH TWICE A DAY   amLODipine 5 MG tablet Commonly known as: NORVASC Take 1 tablet (5 mg total) by mouth daily. Start taking on: March 14, 2022   chlorhexidine 0.12 % solution Commonly known as: PERIDEX SMARTSIG:By Mouth   diclofenac Sodium 1 % Gel Commonly known as: VOLTAREN Apply 2-4 grams to affected joint 4 times daily as needed. What changed:  how much to take how to take this when to take this reasons to take this   dicyclomine 10 MG capsule Commonly known as: BENTYL TAKE 1 CAPSULE BY MOUTH 4 TIMES DAILY BEFORE MEALS AND AT BEDTIME What changed: See the new instructions.   fluticasone furoate-vilanterol 100-25 MCG/ACT Aepb Commonly known as: Breo Ellipta Inhale 1 puff into the lungs daily.   folic acid 1 MG tablet Commonly known as: FOLVITE TAKE 2 TABLETS BY MOUTH EVERY DAY IN THE MORNING   furosemide 80 MG tablet Commonly known as: LASIX Take 1 tablet (80 mg total) by mouth 2 (two) times daily. What changed:  medication strength how much to take additional instructions   Incruse Ellipta 62.5 MCG/ACT  Aepb Generic drug: umeclidinium bromide INHALE 1 PUFF BY MOUTH EVERY DAY   leflunomide 20 MG tablet Commonly known as: ARAVA TAKE 1 TABLET BY MOUTH EVERY DAY   omeprazole 40 MG capsule Commonly known as: PRILOSEC Take 40 mg by mouth at bedtime.   oxybutynin 10 MG 24 hr tablet Commonly known as: DITROPAN-XL Take 10 mg by mouth daily.   phenytoin 100 MG ER capsule Commonly known as: DILANTIN Take 100-200 mg by mouth See admin instructions. Take one capsule in the morning and 2 capsules at bedtime   Potassium Chloride ER 20 MEQ Tbcr Take 20 mEq by mouth daily.   predniSONE 20 MG tablet Commonly known as: DELTASONE Take 2 tablets (40 mg total) by mouth daily.   rosuvastatin 10 MG tablet Commonly known as: CRESTOR Take 10 mg by mouth daily.   warfarin 3 MG tablet Commonly known as: COUMADIN Take 1 tablet (3 mg total) by mouth at bedtime. Restart on 3/24 (48 hours after surgery)        Follow-up Information     Assunta Found, MD Follow up.   Specialty: Family Medicine Contact information: [address] [place] [address] [phone]  Marjie Skiff E, PA-C. Go on 04/18/2022.   Specialties: Physician Assistant, Cardiology Why: 1:30pm Contact information: [address] [place] [address] [phone]               Walking the halls with steady gait, making jokes with staff, denies dyspnea or chest pain. Leg swelling is improved and improves at night. Had episode about 90 minutes last night improved with breathing treatment where he laid down, felt wheezing and dyspnea and pressure on chest. This is typical of other episodes. He no longer lays down at home, sleeps in recliner.   Discharge Exam: Filed Weights   03/11/22 2017 03/12/22 0430 03/13/22 0001  Weight: 103.4 kg 103 kg 105.1 kg  1+ LE edema, RRR without murmur.  Venous stasis changes BL LE Alert, oriented.  Lungs clear, nonlabored  Condition at discharge:  stable  The results of significant diagnostics from this hospitalization (including imaging, microbiology, ancillary and laboratory) are listed below for reference.   Imaging Studies: DG Chest 1 View  Result Date: 03/13/2022 CLINICAL DATA:  Shortness of breath EXAM: CHEST  1 VIEW COMPARISON:  03/11/2022 FINDINGS: Diffuse cardiac enlargement. No vascular congestion, edema, or consolidation. Elevation of the left hemidiaphragm possibly representing sub pulmonic effusion. No pneumothorax. Calcification of the aorta. IMPRESSION: Cardiac enlargement. Possible left sub pulmonic effusion versus elevation of the left hemidiaphragm. Electronically Signed   By: Burman Nieves M.D.   On: 03/13/2022 02:15   DG Chest 2 View  Result Date: 03/11/2022 CLINICAL DATA:  Chest pain EXAM: CHEST - 2 VIEW COMPARISON:  Radiograph 11/21/2019 FINDINGS: Unchanged cardiomediastinal silhouette. Unchanged elevated left hemidiaphragm with left basilar atelectasis. No large pleural effusion. No visible pneumothorax. No acute osseous abnormality. Thoracic spondylosis. IMPRESSION: Unchanged elevated left hemidiaphragm with left basilar atelectasis. No new airspace disease. Electronically Signed   By: Caprice Renshaw M.D.   On: 03/11/2022 12:09   CARDIAC CATHETERIZATION  Result Date: 03/12/2022 Patent coronary arteries with no significant coronary obstructive disease. Suspect noncardiac chest pain.  Medical therapy recommended.   ECHOCARDIOGRAM COMPLETE  Result Date: 03/13/2022    ECHOCARDIOGRAM REPORT   Patient Name:   [name] Date of Exam: 03/13/2022 Medical Rec #:  [id number]        Height:       69.0 in Accession #:    [id number]       Weight:       231.8 lb Date of Birth:  [birth year]       BSA:          2.200 m Patient Age:    70 years         BP:           138/87 mmHg Patient Gender: M                HR:           73 bpm. Exam Location:  Inpatient Procedure: 2D Echo, Cardiac Doppler and Color Doppler Indications:     R07.9* Chest pain, unspecified  History:        Patient has prior history of Echocardiogram examinations, most                 recent 06/08/2019. CHF, COPD, Signs/Symptoms:Shortness of Breath;                 Risk Factors:Sleep Apnea.  Sonographer:    Eulah Pont RDCS Referring Phys: [phone] MICHAEL COOPER IMPRESSIONS  1. Left ventricular ejection fraction,  by estimation, is 55 to 60%. The left ventricle has normal function. The left ventricle has no regional wall motion abnormalities. Left ventricular diastolic parameters were normal.  2. Right ventricular systolic function is normal. The right ventricular size is normal. Tricuspid regurgitation signal is inadequate for assessing PA pressure.  3. The mitral valve is normal in structure. No evidence of mitral valve regurgitation. No evidence of mitral stenosis.  4. The aortic valve was not well visualized. Aortic valve regurgitation is not visualized. No aortic stenosis is present. FINDINGS  Left Ventricle: Left ventricular ejection fraction, by estimation, is 55 to 60%. The left ventricle has normal function. The left ventricle has no regional wall motion abnormalities. The left ventricular internal cavity size was normal in size. There is  no left ventricular hypertrophy. Left ventricular diastolic parameters were normal. Right Ventricle: The right ventricular size is normal. Right vetricular wall thickness was not well visualized. Right ventricular systolic function is normal. Tricuspid regurgitation signal is inadequate for assessing PA pressure. Left Atrium: Left atrial size was normal in size. Right Atrium: Right atrial size was normal in size. Pericardium: There is no evidence of pericardial effusion. Mitral Valve: The mitral valve is normal in structure. No evidence of mitral valve regurgitation. No evidence of mitral valve stenosis. Tricuspid Valve: The tricuspid valve is normal in structure. Tricuspid valve regurgitation is trivial. Aortic Valve: The aortic  valve was not well visualized. Aortic valve regurgitation is not visualized. No aortic stenosis is present. Pulmonic Valve: The pulmonic valve was not well visualized. Pulmonic valve regurgitation is not visualized. Aorta: The aortic root is normal in size and structure. IAS/Shunts: The interatrial septum was not well visualized.  LEFT VENTRICLE PLAX 2D LVIDd:         4.30 cm      Diastology LVIDs:         3.20 cm      LV e' medial:    9.02 cm/s LV PW:         1.00 cm      LV E/e' medial:  8.6 LV IVS:        0.90 cm      LV e' lateral:   9.20 cm/s LVOT diam:     2.00 cm      LV E/e' lateral: 8.4 LV SV:         74 LV SV Index:   34 LVOT Area:     3.14 cm  LV Volumes (MOD) LV vol d, MOD A2C: 100.0 ml LV vol d, MOD A4C: 69.7 ml LV vol s, MOD A2C: 43.5 ml LV vol s, MOD A4C: 28.2 ml LV SV MOD A2C:     56.5 ml LV SV MOD A4C:     69.7 ml LV SV MOD BP:      53.0 ml RIGHT VENTRICLE RV S prime:     11.60 cm/s TAPSE (M-mode): 2.3 cm LEFT ATRIUM             Index        RIGHT ATRIUM           Index LA diam:        3.20 cm 1.45 cm/m   RA Area:     16.90 cm LA Vol (A2C):   61.3 ml 27.87 ml/m  RA Volume:   45.40 ml  20.64 ml/m LA Vol (A4C):   39.5 ml 17.96 ml/m LA Biplane Vol: 51.9 ml 23.59 ml/m  AORTIC VALVE LVOT Vmax:   129.00  cm/s LVOT Vmean:  86.200 cm/s LVOT VTI:    0.236 m  AORTA Ao Root diam: 2.60 cm Ao Asc diam:  2.60 cm MITRAL VALVE MV Area (PHT): 3.91 cm    SHUNTS MV Decel Time: 194 msec    Systemic VTI:  0.24 m MV E velocity: 77.60 cm/s  Systemic Diam: 2.00 cm MV A velocity: 70.30 cm/s MV E/A ratio:  1.10 Epifanio Lesches MD Electronically signed by Epifanio Lesches MD Signature Date/Time: 03/13/2022/4:19:02 PM    Final     Microbiology: Results for orders placed or performed during the hospital encounter of 03/11/22  Resp Panel by RT-PCR (Flu A&B, Covid) Nasopharyngeal Swab     Status: None   Collection Time: 03/11/22  1:20 PM   Specimen: Nasopharyngeal Swab; Nasopharyngeal(NP) swabs in vial  transport medium  Result Value Ref Range Status   SARS Coronavirus 2 by RT PCR NEGATIVE NEGATIVE Final    Comment: (NOTE) SARS-CoV-2 target nucleic acids are NOT DETECTED.  The SARS-CoV-2 RNA is generally detectable in upper respiratory specimens during the acute phase of infection. The lowest concentration of SARS-CoV-2 viral copies this assay can detect is 138 copies/mL. A negative result does not preclude SARS-Cov-2 infection and should not be used as the sole basis for treatment or other patient management decisions. A negative result may occur with  improper specimen collection/handling, submission of specimen other than nasopharyngeal swab, presence of viral mutation(s) within the areas targeted by this assay, and inadequate number of viral copies(<138 copies/mL). A negative result must be combined with clinical observations, patient history, and epidemiological information. The expected result is Negative.  Fact Sheet for Patients:  BloggerCourse.com  Fact Sheet for Healthcare Providers:  SeriousBroker.it  This test is no t yet approved or cleared by the [place] FDA and  has been authorized for detection and/or diagnosis of SARS-CoV-2 by FDA under an Emergency Use Authorization (EUA). This EUA will remain  in effect (meaning this test can be used) for the duration of the COVID-19 declaration under Section 564(b)(1) of the Act, 21 U.S.C.section 360bbb-3(b)(1), unless the authorization is terminated  or revoked sooner.       Influenza A by PCR NEGATIVE NEGATIVE Final   Influenza B by PCR NEGATIVE NEGATIVE Final    Comment: (NOTE) The Xpert Xpress SARS-CoV-2/FLU/RSV plus assay is intended as an aid in the diagnosis of influenza from Nasopharyngeal swab specimens and should not be used as a sole basis for treatment. Nasal washings and aspirates are unacceptable for Xpert Xpress SARS-CoV-2/FLU/RSV testing.  Fact  Sheet for Patients: BloggerCourse.com  Fact Sheet for Healthcare Providers: SeriousBroker.it  This test is not yet approved or cleared by the [place] FDA and has been authorized for detection and/or diagnosis of SARS-CoV-2 by FDA under an Emergency Use Authorization (EUA). This EUA will remain in effect (meaning this test can be used) for the duration of the COVID-19 declaration under Section 564(b)(1) of the Act, 21 U.S.C. section 360bbb-3(b)(1), unless the authorization is terminated or revoked.  Performed at [facility], [address], [place], [place] [address]     Labs: CBC: Recent Labs  Lab 03/11/22 1130 03/12/22 0328 03/13/22 0737  WBC 5.0 9.0 6.8  HGB 15.3 14.7 14.5  HCT 46.3 43.2 43.4  MCV 91.9 89.4 91.4  PLT 151 141* 127*   Basic Metabolic Panel: Recent Labs  Lab 03/11/22 1130 03/12/22 0328 03/12/22 1322 03/13/22 0737  NA 139 140 140 141  K 3.8 4.1 4.3 3.7  CL  102 104 105 104  CO2 27 26 25 25   GLUCOSE 120* 135* 101* 109*  BUN 14 18 19 16   CREATININE 1.32* 1.52* 1.26* 1.37*  CALCIUM 9.1 9.2 9.2 8.6*  MG  --  2.0  --   --    Liver Function Tests: No results for input(s): AST, ALT, ALKPHOS, BILITOT, PROT, ALBUMIN in the last 168 hours. CBG: No results for input(s): GLUCAP in the last 168 hours.  Discharge time spent: greater than 30 minutes.  Signed: Tyrone Nine, MD [facility] 03/13/2022

## 2022-03-13 NOTE — TOC Initial Note (Deleted)
Transition of Care (TOC) - Initial/Assessment Note  ? ? ?Patient Details  ?Name: [name] ?MRN: [id number] ?Date of Birth: [birth year] ? ?Transition of Care [facility]) CM/SW Contact:    ?Ninfa Meeker, RN ?Phone Number: ?03/13/2022, 11:14 AM ? ?Clinical Narrative:     ?Transition of Screening Initial Note:             ? ?Transition of Care Department [facility]) has reviewed patient and no TOC needs have been identified at this time. We will continue to monitor patient advancement through Interdisciplinary progressions. If new patient transition needs arise, please place a consult.  ? ?  ?  ? ? ?Patient Goals and CMS Choice ?  ?  ?  ? ?Expected Discharge Plan and Services ?  ?  ?  ?  ?  ?                ?  ?  ?  ?  ?  ?  ?  ?  ?  ?  ? ?Prior Living Arrangements/Services ?  ?  ?  ?       ?  ?  ?  ?  ? ?Activities of Daily Living ?  ?  ? ?Permission Sought/Granted ?  ?  ?   ?   ?   ?   ? ?Emotional Assessment ?  ?  ?  ?  ?  ?  ? ?Admission diagnosis:  Chest pain [R07.9] ?Chest pain, unspecified type [R07.9] ?Patient Active Problem List  ? Diagnosis Date Noted  ? Chest pain 03/11/2022  ? Stage 3a chronic kidney disease (CKD) ([place]) 03/11/2022  ? Bronchiectasis without complication ([place]) 50/53/9767  ? Incontinence when straining, male 03/20/2021  ? Erectile dysfunction after radical prostatectomy 09/08/2020  ? OAB (overactive bladder) 02/23/2020  ? Class 2 severe obesity due to excess calories with serious comorbidity and body mass index (BMI) of 35.0 to 35.9 in adult [facility])   ? Chronic diastolic HF (heart failure) ([place])   ? Serratia sepsis ([place]) 06/08/2019  ? Bacteremia due to Gram-negative bacteria 06/08/2019  ? Voice hoarseness 04/26/2019  ? OSA (obstructive sleep apnea) 03/10/2019  ? Abnormal CT of the chest 02/09/2019  ? Dyspnea on exertion 10/22/2017  ? History of seizure disorder 02/27/2017  ? Primary osteoarthritis of both hands 02/27/2017  ? Primary osteoarthritis of both feet 02/27/2017  ? Idiopathic gout of  multiple sites 02/27/2017  ? High risk medication use 12/29/2016  ? History of prostate cancer 12/29/2016  ? Primary osteoarthritis of both knees 12/29/2016  ? Chronic idiopathic gout involving toe without tophus 12/29/2016  ? History of CHF (congestive heart failure) 12/29/2016  ? History of COPD 12/29/2016  ? Plantar pustular psoriasis 12/29/2016  ? DVT (deep venous thrombosis) ([place]) 10/31/2016  ? COPD with acute exacerbation ([place]) 10/31/2016  ? CHF (congestive heart failure) ([place]) 10/31/2016  ? Prostate cancer ([place]) 08/30/2015  ? Malignant neoplasm of prostate ([place]) 07/04/2015  ? Chest pain at rest 07/05/2014  ? Weakness 07/05/2014  ? Complicated postphlebitic syndrome 11/16/2013  ? Personal history of DVT (deep vein thrombosis) 05/18/2013  ? Chronic anticoagulation 05/18/2013  ? Diverticulosis of colon without hemorrhage 05/18/2013  ? Rheumatoid arthritis ([place]) 05/18/2013  ? Seizure disorder ([place]) 05/18/2013  ? Hx of adenomatous colonic polyps 05/18/2013  ? GERD 05/08/2010  ? NAUSEA 05/08/2010  ? FLATULENCE-GAS-BLOATING 05/08/2010  ? ?PCP:  [name], MD ?Pharmacy:   ?[id number] [place] [place] ?[place] ?[place] [address] ?Phone: [phone] Fax: [phone] ? ?[facility] [place] [place] [facility] ?[facility] ?[place] [address] ?Phone: [phone] Fax: [phone] ? ?[place] [address] [address] ?Phone: [phone] Fax: [phone] ? ? ? ? ?Social Determinants of Health (SDOH) Interventions ?  ? ?Readmission Risk Interventions ?No flowsheet data found. ? ? ?

## 2022-03-13 NOTE — Progress Notes (Addendum)
HOSPITAL MEDICINE OVERNIGHT EVENT NOTE   ? ?Notified by nursing that patient has been complaining of increasing shortness of breath throughout the evening shift.  Patient is also complaining of intermittent chest tightness.   ? ?On exam, nursing reports patient has diffuse expiratory wheezing.  Albuterol nebulized was administered with minimal improvement in symptoms.   ? ?Will obtaining ECG and Chest Xray.  ? ?Vernelle Emerald  MD ?[facility]  ? ?ADDENDUM 2:18am ? ?EKG unremarkable with no dynamic ST segment change.  Chest x-ray reveals no evidence of pulmonary edema. ? ?Patient placed on home regimen of CPAP nightly for sleep and states he feels much better.  We will continue to monitor. ? ?Sherryll Burger [place] Comes ? ? ? ? ? ? ? ? ? ? ? ? ?

## 2022-03-13 NOTE — Progress Notes (Addendum)
Patients home CPAP set-up and plugged into red outlet within patients reach. Patient not ready for CPAP at this time.  ?

## 2022-03-13 NOTE — Progress Notes (Signed)
Patient became [facility] and states his breathing has been fine all day. His weight yesterday was 103kg and tonight he weighs 105.1kg. Provider notified. ?

## 2022-03-13 NOTE — Progress Notes (Signed)
Patient placed self on home CPAP.  ?

## 2022-03-13 NOTE — Progress Notes (Signed)
Mobility Specialist Progress Note: ? ? 03/13/22 1310  ?Mobility  ?Activity Ambulated independently in hallway  ?Level of Assistance Independent  ?Assistive Device None  ?Distance Ambulated (ft) 300 ft  ?Activity Response Tolerated well  ?$Mobility charge 1 Mobility  ? ?Pt agreeable to mobility session today. No physical assist required. Pt back in chair with all needs met, MD in room.  ? ?Nelta Numbers ?Acute Rehab ?Phone: [phone] ?Office Phone: [phone] ? ?

## 2022-03-13 NOTE — Progress Notes (Signed)
?  Echocardiogram ?2D Echocardiogram has been performed. ? ?[name] ?03/13/2022, 11:52 AM ?

## 2022-03-13 NOTE — Progress Notes (Signed)
? ?Progress Note ? ?Patient Name: [name] ?Date of Encounter: 03/13/2022 ? ?[facility] Cardiologist: [name], MD  ? ?Subjective  ? ?No chest pain or dyspnea. Increase SOB overnight which improved after CPAP.  ? ?Inpatient Medications  ?  ?Scheduled Meds: ? allopurinol  100 mg Oral BID  ? dicyclomine  20 mg Oral BID  ? enoxaparin (LOVENOX) injection  50 mg Subcutaneous Q24H  ? fluticasone furoate-vilanterol  1 puff Inhalation Q2000  ? furosemide  40 mg Oral BID  ? leflunomide  20 mg Oral Daily  ? oxybutynin  10 mg Oral Daily  ? pantoprazole  40 mg Oral Daily  ? phenytoin  100 mg Oral Daily  ? phenytoin  200 mg Oral QHS  ? potassium chloride SA  20 mEq Oral Daily  ? rosuvastatin  20 mg Oral Daily  ? sodium chloride flush  3 mL Intravenous Q12H  ? sodium chloride flush  3 mL Intravenous Q12H  ? umeclidinium bromide  1 puff Inhalation Daily  ? ?Continuous Infusions: ? sodium chloride    ? ?PRN Meds: ?sodium chloride, acetaminophen **OR** acetaminophen, albuterol, iohexol, ondansetron **OR** ondansetron (ZOFRAN) IV, sodium chloride flush, sodium chloride flush  ? ?Vital Signs  ?  ?Vitals:  ? 03/12/22 2100 03/12/22 2200 03/13/22 0001 03/13/22 0400  ?BP: 133/68 126/67  (!) 155/92  ?Pulse:    67  ?Resp:    18  ?Temp:    (!) 97.4 ?F (36.3 ?C)  ?TempSrc:    Oral  ?SpO2:    97%  ?Weight:   105.1 kg   ?Height:      ? ? ?Intake/Output Summary (Last 24 hours) at 03/13/2022 0808 ?Last data filed at 03/13/2022 0547 ?Gross per 24 hour  ?Intake 1494.55 ml  ?Output 2200 ml  ?Net -705.45 ml  ? ?Last 3 Weights 03/13/2022 03/12/2022 03/11/2022  ?Weight (lbs) 231 lb 12.8 oz 227 lb 1.2 oz 228 lb  ?Weight (kg) 105.144 kg 103 kg 103.42 kg  ?   ? ?Telemetry  ?  ?NSR - Personally Reviewed ? ?ECG  ?  ?N/A ? ?Physical Exam  ? ?GEN: No acute distress.   ?Neck: No JVD ?Cardiac: RRR, no murmurs, rubs, or gallops. Right radial cath site without hematoma  ?Respiratory: Clear to auscultation bilaterally. ?GI: Soft, nontender,  non-distended  ?MS: No edema; No deformity. ?Neuro:  Nonfocal  ?Psych: Normal affect  ? ?Labs  ?  ?High Sensitivity Troponin:   ?Recent Labs  ?Lab 03/11/22 ?1130 03/11/22 ?1301  ?TROPONINIHS 7 6  ?   ?Chemistry ?Recent Labs  ?Lab 03/11/22 ?1130 03/12/22 ?0328 03/12/22 ?1322  ?NA 139 140 140  ?K 3.8 4.1 4.3  ?CL 102 104 105  ?CO2 '27 26 25  '$ ?GLUCOSE 120* 135* 101*  ?BUN '14 18 19  '$ ?CREATININE 1.32* 1.52* 1.26*  ?CALCIUM 9.1 9.2 9.2  ?MG  --  2.0  --   ?GFRNONAA 58* 49* >60  ?ANIONGAP '10 10 10  '$ ?  ?Lipids No results for input(s): CHOL, TRIG, HDL, LABVLDL, LDLCALC, CHOLHDL in the last 168 hours.  ?Hematology ?Recent Labs  ?Lab 03/11/22 ?1130 03/12/22 ?0328  ?WBC 5.0 9.0  ?RBC 5.04 4.83  ?HGB 15.3 14.7  ?HCT 46.3 43.2  ?MCV 91.9 89.4  ?MCH 30.4 30.4  ?MCHC 33.0 34.0  ?RDW 15.0 14.7  ?PLT 151 141*  ? ?Thyroid No results for input(s): TSH, FREET4 in the last 168 hours.  ?BNP ?Recent Labs  ?Lab 03/11/22 ?1130  ?BNP 38.0  ?  ?  DDimer No results for input(s): DDIMER in the last 168 hours.  ? ?Radiology  ?  ?DG Chest 1 View ? ?Result Date: 03/13/2022 ?CLINICAL DATA:  Shortness of breath EXAM: CHEST  1 VIEW COMPARISON:  03/11/2022 FINDINGS: Diffuse cardiac enlargement. No vascular congestion, edema, or consolidation. Elevation of the left hemidiaphragm possibly representing sub pulmonic effusion. No pneumothorax. Calcification of the aorta. IMPRESSION: Cardiac enlargement. Possible left sub pulmonic effusion versus elevation of the left hemidiaphragm. Electronically Signed   By: Lucienne Capers M.D.   On: 03/13/2022 02:15  ? ?DG Chest 2 View ? ?Result Date: 03/11/2022 ?CLINICAL DATA:  Chest pain EXAM: CHEST - 2 VIEW COMPARISON:  Radiograph 11/21/2019 FINDINGS: Unchanged cardiomediastinal silhouette. Unchanged elevated left hemidiaphragm with left basilar atelectasis. No large pleural effusion. No visible pneumothorax. No acute osseous abnormality. Thoracic spondylosis. IMPRESSION: Unchanged elevated left hemidiaphragm with left  basilar atelectasis. No new airspace disease. Electronically Signed   By: Maurine Simmering M.D.   On: 03/11/2022 12:09  ? ?CARDIAC CATHETERIZATION ? ?Result Date: 03/12/2022 ?Patent coronary arteries with no significant coronary obstructive disease. Suspect noncardiac chest pain.  Medical therapy recommended.   ? ?Cardiac Studies  ? ?LEFT HEART CATH AND CORONARY ANGIOGRAPHY  ? ?Conclusion ? ?Patent coronary arteries with no significant coronary obstructive disease. ? ?Suspect noncardiac chest pain.  Medical therapy recommended. ? ?Flowsheet Row Most Recent Value  ?AO Systolic Pressure 010 mmHg  ?AO Diastolic Pressure 69 mmHg  ?AO Mean 97 mmHg  ?LV Systolic Pressure 932 mmHg  ?LV Diastolic Pressure 13 mmHg  ?LV EDP 17 mmHg  ?AOp Systolic Pressure 355 mmHg  ?AOp Diastolic Pressure 81 mmHg  ?AOp Mean Pressure 107 mmHg  ?LVp Systolic Pressure 732 mmHg  ?LVp Diastolic Pressure 51 mmHg  ?LVp EDP Pressure 23 mmHg  ? ?Patient Profile  ?   ?71 y.o. male with a hx of RA, COPD, chronic LE edema in the setting of chronic venous stasis, CKD III prior DVT on warfarin, and OSA who is being seen for the evaluation of chest pain at the request of Dr. Manuella Ghazi. ? ?Assessment & Plan  ?  ?Chest pain  ?- Chest pain free currently. Troponin negative. Cath showed normal coronaries.  ?- Continue statin (Crestor increased to '20mg'$  this admission, given normal coronary, resume home dose at discharge) ?  ?2. Concern for HFpEF Exacerbation ?Patient with orthopnea and needing to sleep in a recliner for the past month as breathing is worse when laying flat. CXR without significant pulmonary edema. Possibly some right sided dysfunction as well given known COPD and OSA. BNP normal. LVEDP 63m HG by cath. Continue home lasix '40mg'$  BID. Pending echo.  ?  ?3. COPD: ?- Patient wheezing on presentation now improved s/p nebs and IV steroids. Overnight again had wheezing.  ?- Management per IM ?  ?4. History of DVT: ?- On chronic warfarin. INR 1.4. Held for cath. ?   ?5. Chronic Venous Stasis: ?-On lasix at home ?  ?6. CKD IIIa: ?-Trend. Seems baseline SCr 1.2-1.3 ?- SCr 1.32>>1.52>> pending lab this morning  ? ? ? ?For questions or updates, please contact [facility]?Please consult [email or web address] for contact info under  ? ?  ?   ?Signed, ?BLeanor Kail PA  ?03/13/2022, 8:08 AM    ?

## 2022-03-15 ENCOUNTER — Ambulatory Visit (INDEPENDENT_AMBULATORY_CARE_PROVIDER_SITE_OTHER): Payer: Medicare Other | Admitting: Urology

## 2022-03-15 ENCOUNTER — Encounter: Payer: Self-pay | Admitting: Urology

## 2022-03-15 ENCOUNTER — Other Ambulatory Visit: Payer: Self-pay

## 2022-03-15 VITALS — BP 162/88 | HR 81

## 2022-03-15 DIAGNOSIS — N5231 Erectile dysfunction following radical prostatectomy: Secondary | ICD-10-CM | POA: Diagnosis not present

## 2022-03-15 DIAGNOSIS — N3281 Overactive bladder: Secondary | ICD-10-CM | POA: Diagnosis not present

## 2022-03-15 DIAGNOSIS — C61 Malignant neoplasm of prostate: Secondary | ICD-10-CM | POA: Diagnosis not present

## 2022-03-15 LAB — URINALYSIS, ROUTINE W REFLEX MICROSCOPIC
Bilirubin, UA: NEGATIVE
Glucose, UA: NEGATIVE
Ketones, UA: NEGATIVE
Leukocytes,UA: NEGATIVE
Nitrite, UA: NEGATIVE
Protein,UA: NEGATIVE
RBC, UA: NEGATIVE
Specific Gravity, UA: 1.015 (ref 1.005–1.030)
Urobilinogen, Ur: 0.2 mg/dL (ref 0.2–1.0)
pH, UA: 7 (ref 5.0–7.5)

## 2022-03-15 MED ORDER — OXYBUTYNIN CHLORIDE ER 10 MG PO TB24: 10.0000 mg | ORAL_TABLET | Freq: Every day | ORAL | 3 refills | Status: DC

## 2022-03-15 NOTE — Addendum Note (Signed)
Addended byIris Pert on: 03/15/2022 01:52 PM ? ? Modules accepted: Orders ? ?

## 2022-03-15 NOTE — Progress Notes (Signed)
03/15/2022 9:21 AM   [name] Nov 06, 1951 [id number]  Referring provider: Assunta Found, [name] [address] [place],  [place] [address]  Followup prostate cancer, OAB and erectile dysfunction   HPI: [name] is a 70yo here for followup for prostate cancer, OAb and erectile dysfunction. PSA undetectable. He takes ditropan xl daily which works well for his urgency. He had an AUS placed 1 year ago which is working well for him. He has stable erectile dysfunction and is not interested in therapy.    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 Unknown origin.   Shortness of breath dyspnea    exertion    Sleep apnea    cpap    Tubular adenoma of colon 04/2008    Surgical History: Past Surgical History:  Procedure Laterality Date   CHOLECYSTECTOMY     CIRCUMCISION     COLONOSCOPY     CYSTOSCOPY WITH LITHOLAPAXY N/A 03/17/2019   Procedure: CYSTOSCOPY WITH LITHOLAPAXY AND REMOVAL OF FOREIGN BODY;  Surgeon: Malen Gauze, [name];  Location: [facility];  Service: Urology;  Laterality: N/A;   DOPPLER ECHOCARDIOGRAPHY N/A 01-21-2012   TECHNICALLY DIFFICULT. MILD CONCENTRIC LV HYPERTROPHY. LV CAVITY IS SMALL.. EF=> 55%.  TRANSMITRAL SPECTRAL FLOW PATTREN IS SUGGESTIVE OF IMPAIRED LV RELAXATION. RV SYSTOLIC PRESSURE IS . LEFT ATRIAL SIZE IS NORMAL. AV APPEARS MILDLY SCLEROTIC. NO SIGN VALVE DISEASE NOTED.   LEFT HEART CATH AND CORONARY ANGIOGRAPHY N/A 03/12/2022   Procedure: LEFT HEART CATH AND CORONARY ANGIOGRAPHY;  Surgeon: Tonny Bollman, [name];  Location: [facility] INVASIVE CV LAB;  Service: Cardiovascular;  Laterality: N/A;   LYMPHADENECTOMY Bilateral 08/30/2015   Procedure: PELVIC LYMPHADENECTOMY;  Surgeon: Malen Gauze, [name];  Location: [facility];  Service: Urology;  Laterality: Bilateral;   NUCLEAR STRESS TEST N/A 01-21-2012   NORMAL PATTERN OF PERFUSION IN ALL REGIONS. POST STRESS LV SIZE IS NORMAL. NO EVIDENCE OF INDUCIBLE ISCHEMIA. EF 54%.   POLYPECTOMY     PROSTATE BIOPSY     ROBOT ASSISTED LAPAROSCOPIC RADICAL PROSTATECTOMY N/A 08/30/2015   Procedure: ROBOTIC ASSISTED LAPAROSCOPIC RADICAL PROSTATECTOMY;  Surgeon: Malen Gauze, [name];  Location: [facility];  Service: Urology;  Laterality: N/A;   URINARY SPHINCTER IMPLANT N/A 03/20/2021   Procedure: ARTIFICIAL URINARY SPHINCTER CYSTOSCOPY;  Surgeon: Alfredo Martinez, [name];  Location: [facility];  Service: Urology;  Laterality: N/A;  REQUESTING 90 MINS   US VENOUS LOWER EXT Right 03/07/11   PERSISTENT DVT IN RIGHT LOWER EXT.WITH PERSISTANT VISUALIZATION OF HYPOECHOIC  THROMBUS WITHIN THE FEMORAL, PROFUNDA FEMORAL AND POPLITEAL VEINS. WHEN COMPARED TO PREVIOUS, CLOT IS NO LONGER IDENTIFIED WITH IN THE RIGHT CFV.    Home Medications:  Allergies as of 03/15/2022       Reactions   Orencia [abatacept] Anaphylaxis   Had prostate cancer   Carbamazepine Rash   REACTION: makes drowsy BRAND NAME IS TEGRETOL   Celecoxib Itching, Rash   BRAND NAME IS CELEBREX   Cephalexin Rash   "Severe Rash".  BRAND NAME IS KEFLEX.    Enbrel [etanercept] Rash   Humira [adalimumab] Rash   Levofloxacin Other (See Comments)   REACTION: GI Intolerance BRAND NAME IS LEVAQUIN   Sulfa  Antibiotics Rash   Sulfasalazine Rash        Medication List        Accurate as of March 15, 2022  9:21 AM. If you have any questions, ask your nurse or doctor.          acetaminophen 650 MG CR tablet Commonly known as: TYLENOL Take 1,300 mg by mouth every 8 (eight) hours as needed for pain.   ACTEMRA IV Inject 4 mg/kg into the vein every 28 (twenty-eight) days. For Rheumatoid Arthritis. Last ordered 06/22/20 x 2 doses   albuterol (2.5 MG/3ML) 0.083% nebulizer solution Commonly known as: PROVENTIL Take 3 mLs (2.5 mg total) by nebulization every 6 (six) hours as needed for wheezing or shortness of breath.   alendronate 70 MG tablet Commonly known as: FOSAMAX Take 70 mg by mouth once a week.   allopurinol 100 MG tablet Commonly known as: ZYLOPRIM TAKE 1 TABLET BY MOUTH TWICE A DAY   amLODipine 5 MG tablet Commonly known as: NORVASC Take 1 tablet (5 mg total) by mouth daily.   chlorhexidine 0.12 % solution Commonly known as: PERIDEX SMARTSIG:By Mouth   diclofenac Sodium 1 % Gel Commonly known as: VOLTAREN Apply 2-4 grams to affected joint 4 times daily as needed. What changed:  how much to take how to take this when to take this reasons to take this   dicyclomine 10 MG capsule Commonly known as: BENTYL TAKE 1 CAPSULE BY MOUTH 4 TIMES DAILY BEFORE MEALS AND AT BEDTIME What changed: See the new instructions.   fluticasone furoate-vilanterol 100-25 MCG/ACT Aepb Commonly known as: Breo Ellipta Inhale 1 puff into the lungs daily.   folic acid 1 MG tablet Commonly known as: FOLVITE TAKE 2 TABLETS BY MOUTH EVERY DAY IN THE MORNING   furosemide 80 MG tablet Commonly known as: LASIX Take 1 tablet (80 mg total) by mouth 2 (two) times daily.   Incruse Ellipta 62.5 MCG/ACT Aepb Generic drug: umeclidinium bromide INHALE 1 PUFF BY MOUTH EVERY DAY   leflunomide 20 MG tablet Commonly known as: ARAVA TAKE 1 TABLET BY MOUTH EVERY DAY   omeprazole 40 MG  capsule Commonly known as: PRILOSEC Take 40 mg by mouth at bedtime.   oxybutynin 10 MG 24 hr tablet Commonly known as: DITROPAN-XL Take 10 mg by mouth daily.   phenytoin 100 MG ER capsule Commonly known as: DILANTIN Take 100-200 mg by mouth See admin instructions. Take one capsule in the morning and 2 capsules at bedtime   Potassium Chloride ER 20 MEQ Tbcr Take 20 mEq by mouth daily.   predniSONE 20 MG tablet Commonly known as: DELTASONE Take 2 tablets (40 mg total) by mouth daily.   rosuvastatin 10 MG tablet Commonly known as: CRESTOR Take 10 mg by mouth daily.   warfarin 3 MG tablet Commonly  known as: COUMADIN Take 1 tablet (3 mg total) by mouth at bedtime. Restart on 3/24 (48 hours after surgery)        Allergies:  Allergies  Allergen Reactions   Orencia [Abatacept] Anaphylaxis    Had prostate cancer   Carbamazepine Rash    REACTION: makes drowsy BRAND NAME IS TEGRETOL   Celecoxib Itching and Rash    BRAND NAME IS CELEBREX   Cephalexin Rash    "Severe Rash".  BRAND NAME IS KEFLEX.    Enbrel [Etanercept] Rash   Humira [Adalimumab] Rash   Levofloxacin Other (See Comments)    REACTION: GI Intolerance BRAND NAME IS LEVAQUIN   Sulfa Antibiotics Rash   Sulfasalazine Rash    Family History: Family History  Problem Relation Age of Onset   Skin cancer Father    Stomach cancer Father    Heart attack Father    Alzheimer's disease Mother    Throat cancer Brother    Stomach cancer Brother    Lung cancer Brother    Heart attack Brother    Heart attack Brother    Breast cancer Sister    Heart attack Sister    Stroke Sister    Bladder Cancer Sister    Heart murmur Sister    Colon cancer Brother    Colon polyps Neg Hx     Social History:  reports that he quit smoking about 25 years ago. His smoking use included cigarettes. He has a 60.00 pack-year smoking history. He has never used smokeless tobacco. He reports that he does not drink alcohol and does not use  drugs.  ROS: All other review of systems were reviewed and are negative except what is noted above in HPI  Physical Exam: BP (!) 162/88   Pulse 81   Constitutional:  Alert and oriented, No acute distress. HEENT: [place] AT, moist mucus membranes.  Trachea midline, no masses. Cardiovascular: No clubbing, cyanosis, or edema. Respiratory: Normal respiratory effort, no increased work of breathing. GI: Abdomen is soft, nontender, nondistended, no abdominal masses GU: No CVA tenderness.  Lymph: No cervical or inguinal lymphadenopathy. Skin: No rashes, bruises or suspicious lesions. Neurologic: Grossly intact, no focal deficits, moving all 4 extremities. Psychiatric: Normal mood and affect.  Laboratory Data: Lab Results  Component Value Date   WBC 6.8 03/13/2022   HGB 14.5 03/13/2022   HCT 43.4 03/13/2022   MCV 91.4 03/13/2022   PLT 127 (L) 03/13/2022    Lab Results  Component Value Date   CREATININE 1.37 (H) 03/13/2022    Lab Results  Component Value Date   PSA <0.1 02/16/2020    No results found for: TESTOSTERONE  No results found for: HGBA1C  Urinalysis    Component Value Date/Time   COLORURINE YELLOW 11/29/2018 1033   APPEARANCEUR Clear 03/07/2021 1621   LABSPEC 1.025 11/29/2018 1033   PHURINE 5.0 11/29/2018 1033   GLUCOSEU Negative 03/07/2021 1621   HGBUR MODERATE (A) 11/29/2018 1033   BILIRUBINUR Negative 03/07/2021 1621   KETONESUR NEGATIVE 11/29/2018 1033   PROTEINUR Negative 03/07/2021 1621   PROTEINUR 30 (A) 11/29/2018 1033   UROBILINOGEN 0.2 09/08/2020 1326   UROBILINOGEN 0.2 09/24/2012 1640   NITRITE Negative 03/07/2021 1621   NITRITE NEGATIVE 11/29/2018 1033   LEUKOCYTESUR Negative 03/07/2021 1621    Lab Results  Component Value Date   LABMICR Comment 03/07/2021   BACTERIA NONE SEEN 11/29/2018    Pertinent Imaging:  Results for orders placed during the hospital encounter of 05/27/13  DG  Abd 1 View  Narrative *RADIOLOGY REPORT*  Clinical  Data: The mid to lower abdominal pain for 1 month with some nausea and constipation  ABDOMEN - 1 VIEW  Comparison: 03/15/2011, abdomen CT  Findings: The bowel gas pattern is within normal limits.  There is no significant increased stool.  There are changes from a prior cholecystectomy.  The soft tissues are otherwise unremarkable.  No significant bony abnormality.  IMPRESSION: No acute findings.  No obstruction.  No significant increase in stool.   Original Report Authenticated By: Amie Portland, M.D.  Results for orders placed during the hospital encounter of 09/03/07  US VenoUS Imaging Bilateral  Narrative Clinical Data: Bilateral lower extremity edema.  History of DVT.  Increased d-dimer. BILATERAL LOWER EXTREMITY VENOUS DOPPLER ULTRASOUND: Technique:  Gray-scale sonography with compression, as well as color and duplex Doppler ultrasound, were performed to evaluate the deep venous system from the level of the common femoral vein through the popliteal and proximal calf veins. Findings:  There is no evidence of acute DVT.  Normal compressibility phasicity and augmentation of the deep veins bilaterally.  There is some residual thrombus in the left popliteal and left posterior tibial veins.  Patient had findings of an acute DVT in the left popliteal and posterior tibial veins as noted on the 04/29/05 exam.  Impression Negative for acute DVT involving the lower extremities.  Provider: Vertell Novak  No results found for this or any previous visit.  No results found for this or any previous visit.  No results found for this or any previous visit.  No results found for this or any previous visit.  No results found for this or any previous visit.  No results found for this or any previous visit.   Assessment & Plan:    1. Prostate cancer (HCC) -RTC 1 year with PSA  2. OAB (overactive bladder) -continue ditropan 15mg  daily  3. Erectile dysfunction after radical  prostatectomy -patient defers therapy at this time   No follow-ups on file.  Mark Aye, [name]  [facility]

## 2022-03-15 NOTE — Patient Instructions (Signed)
Prostate Cancer °The prostate is a small gland that helps make semen. It is located below a man's bladder, in front of the rectum. Prostate cancer is when abnormal cells grow in this gland. °What are the causes? °The cause of this condition is not known. °What increases the risk? °Being age 72 or older. °Having a family history of prostate cancer. °Having a family history of cancer of the breasts or ovaries. °Having genes that are passed from parent to child (inherited). °Having Lynch syndrome. °African American men and men of African descent are diagnosed with prostate cancer at higher rates than other men. °What are the signs or symptoms? °Problems peeing (urinating). This may include: °A stream that is weak, or pee that stops and starts. °Trouble starting or stopping your pee. °Trouble emptying all of your pee. °Needing to pee more often, especially at night. °Blood in your pee or semen. °Pain in the: °Lower back. °Lower belly (abdomen). °Hips. °Trouble getting an erection. °Weakness or numbness in the legs or feet. °How is this treated? °Treatment for this condition depends on: °How much the cancer has spread. °Your age. °The kind of treatment you want. °Your health. °Treatments include: °Being watched. This is called observation. You will be tested from time to time, but you will not get treated. Tests are to make sure that the cancer is not growing. °Surgery. This may be done to: °Take out (remove) the prostate. °Freeze and kill cancer cells. °Radiation. This uses a strong beam of energy to kill cancer cells. °Chemotherapy. This uses medicines that stop cancer cells from increasing. This kills cancer cells and healthy cells. °Targeted therapy. This kills cancer cells only. Healthy cells are not affected. °Hormone treatment. This stops the body from making hormones that help the cancer cells grow. °Follow these instructions at home: °Lifestyle °Do not smoke or use any products that contain nicotine or tobacco.  If you need help quitting, ask your doctor. °Eat a healthy diet. °Treatment may affect your ability to have sex. If you have a partner, touch, hold, hug, and caress your partner to have intimate moments. °Get plenty of sleep. °Ask your doctor for help to find a support group for men with prostate cancer. °General instructions °Take over-the-counter and prescription medicines only as told by your doctor. °If you have to go to the hospital, let your cancer doctor (oncologist) know. °Keep all follow-up visits. °Where to find more information °American Cancer Society: www.cancer.org °American Society of Clinical Oncology: www.cancer.net °National Cancer Institute: www.cancer.gov °Contact a doctor if: °You have new or more trouble peeing. °You have new or more blood in your pee. °You have new or more pain in your hips, back, or chest. °Get help right away if: °You have weakness in your legs. °You lose feeling in your legs. °You cannot control your pee or your poop (stool). °You have chills or a fever. °Summary °The prostate is a male gland that helps make semen. °Prostate cancer is when abnormal cells grow in this gland. °Treatment includes doing surgery, using medicines, using strong beams of energy, or watching without treatment. °Ask your doctor for help to find a support group for men with prostate cancer. °Contact a doctor if you have problems peeing or have any new pain that you did not have before. °This information is not intended to replace advice given to you by your health care provider. Make sure you discuss any questions you have with your health care provider. °Document Revised: 03/14/2021 Document Reviewed: 03/14/2021 °Elsevier   Patient Education © 2022 [employer] Inc. ° °

## 2022-03-20 DIAGNOSIS — T801XXD Vascular complications following infusion, transfusion and therapeutic injection, subsequent encounter: Secondary | ICD-10-CM | POA: Diagnosis not present

## 2022-03-20 DIAGNOSIS — M069 Rheumatoid arthritis, unspecified: Secondary | ICD-10-CM | POA: Diagnosis not present

## 2022-03-20 DIAGNOSIS — G40309 Generalized idiopathic epilepsy and epileptic syndromes, not intractable, without status epilepticus: Secondary | ICD-10-CM | POA: Diagnosis not present

## 2022-03-20 DIAGNOSIS — I50812 Chronic right heart failure: Secondary | ICD-10-CM | POA: Diagnosis not present

## 2022-03-20 DIAGNOSIS — Z6834 Body mass index (BMI) 34.0-34.9, adult: Secondary | ICD-10-CM | POA: Diagnosis not present

## 2022-03-20 DIAGNOSIS — E6609 Other obesity due to excess calories: Secondary | ICD-10-CM | POA: Diagnosis not present

## 2022-03-20 DIAGNOSIS — J449 Chronic obstructive pulmonary disease, unspecified: Secondary | ICD-10-CM | POA: Diagnosis not present

## 2022-03-20 DIAGNOSIS — I82409 Acute embolism and thrombosis of unspecified deep veins of unspecified lower extremity: Secondary | ICD-10-CM | POA: Diagnosis not present

## 2022-03-26 DIAGNOSIS — U071 COVID-19: Secondary | ICD-10-CM | POA: Diagnosis not present

## 2022-03-28 DIAGNOSIS — Z7901 Long term (current) use of anticoagulants: Secondary | ICD-10-CM | POA: Diagnosis not present

## 2022-03-29 ENCOUNTER — Ambulatory Visit (HOSPITAL_COMMUNITY)
Admission: RE | Admit: 2022-03-29 | Discharge: 2022-03-29 | Disposition: A | Discharge status: Home / Self Care | Payer: Medicare Other | Source: Ambulatory Visit | Attending: Rheumatology | Admitting: Rheumatology

## 2022-03-29 DIAGNOSIS — M0579 Rheumatoid arthritis with rheumatoid factor of multiple sites without organ or systems involvement: Secondary | ICD-10-CM | POA: Diagnosis not present

## 2022-03-29 MED ORDER — DIPHENHYDRAMINE HCL 25 MG PO CAPS: 25.0000 mg | ORAL_CAPSULE | ORAL | Status: DC

## 2022-03-29 MED ORDER — ACETAMINOPHEN 325 MG PO TABS: 650.0000 mg | ORAL_TABLET | ORAL | Status: DC

## 2022-03-29 MED ORDER — TOCILIZUMAB 400 MG/20ML IV SOLN
400.0000 mg | INTRAVENOUS | Status: DC
  Administered 2022-03-29: 400 mg via INTRAVENOUS
  Filled 2022-03-29: qty 20

## 2022-04-11 ENCOUNTER — Other Ambulatory Visit: Payer: Self-pay | Admitting: Pulmonary Disease

## 2022-04-11 ENCOUNTER — Other Ambulatory Visit: Payer: Self-pay | Admitting: Cardiovascular Disease

## 2022-04-11 ENCOUNTER — Encounter: Payer: Self-pay | Admitting: Student

## 2022-04-11 ENCOUNTER — Ambulatory Visit (INDEPENDENT_AMBULATORY_CARE_PROVIDER_SITE_OTHER): Payer: Medicare Other | Admitting: Student

## 2022-04-11 ENCOUNTER — Other Ambulatory Visit: Payer: Self-pay | Admitting: Physician Assistant

## 2022-04-11 VITALS — BP 144/78 | HR 59 | Ht 68.0 in | Wt 228.8 lb

## 2022-04-11 DIAGNOSIS — E785 Hyperlipidemia, unspecified: Secondary | ICD-10-CM

## 2022-04-11 DIAGNOSIS — I2781 Cor pulmonale (chronic): Secondary | ICD-10-CM

## 2022-04-11 DIAGNOSIS — I10 Essential (primary) hypertension: Secondary | ICD-10-CM

## 2022-04-11 DIAGNOSIS — R079 Chest pain, unspecified: Secondary | ICD-10-CM | POA: Diagnosis not present

## 2022-04-11 DIAGNOSIS — R600 Localized edema: Secondary | ICD-10-CM

## 2022-04-11 DIAGNOSIS — Z86718 Personal history of other venous thrombosis and embolism: Secondary | ICD-10-CM

## 2022-04-11 DIAGNOSIS — I50812 Chronic right heart failure: Secondary | ICD-10-CM | POA: Diagnosis not present

## 2022-04-11 DIAGNOSIS — I87009 Postthrombotic syndrome without complications of unspecified extremity: Secondary | ICD-10-CM

## 2022-04-11 DIAGNOSIS — I5032 Chronic diastolic (congestive) heart failure: Secondary | ICD-10-CM

## 2022-04-11 MED ORDER — LEFLUNOMIDE 20 MG PO TABS: 20.0000 mg | ORAL_TABLET | Freq: Every day | ORAL | 0 refills | Status: DC

## 2022-04-11 MED ORDER — SPIRONOLACTONE 25 MG PO TABS: 12.5000 mg | ORAL_TABLET | Freq: Every day | ORAL | 3 refills | Status: DC

## 2022-04-11 NOTE — Patient Instructions (Signed)
Medication Instructions:  ?Start Spirolactone 25 mg ( Take 0.5 Tablet 12.5 mg Daily). ?*If you need a refill on your cardiac medications before your next appointment, please call your pharmacy* ? ? ?Lab Work: ?BMET  1 week ?If you have labs (blood work) drawn today and your tests are completely normal, you will receive your results only by: ?MyChart Message (if you have MyChart) OR ?A paper copy in the mail ?If you have any lab test that is abnormal or we need to change your treatment, we will call you to review the results. ? ? ?Testing/Procedures: ?None ? ? ?Follow-Up: ?At [facility], you and your health needs are our priority.  As part of our continuing mission to provide you with exceptional heart care, we have created designated Provider Care Teams.  These Care Teams include your primary Cardiologist (physician) and Advanced Practice Providers (APPs -  Physician Assistants and Nurse Practitioners) who all work together to provide you with the care you need, when you need it. ? ?We recommend signing up for the patient portal called "MyChart".  Sign up information is provided on this After Visit Summary.  MyChart is used to connect with patients for Virtual Visits (Telemedicine).  Patients are able to view lab/test results, encounter notes, upcoming appointments, etc.  Non-urgent messages can be sent to your provider as well.   ?To learn more about what you can do with MyChart, go to [email or web address].   ? ?Your next appointment:   ?3-4 month(s) ? ?The format for your next appointment:   ?In Person ? ?Provider:   ?[name], MD   ? ? ? ? ?Important Information About Sugar ? ? ? ? ?  ?

## 2022-04-11 NOTE — Telephone Encounter (Signed)
Next Visit: 07/23/2022 ? ?Last Visit: 02/21/2022 ? ?Last Fill: 01/03/2022 ? ?DX: Rheumatoid arthritis involving multiple sites with positive rheumatoid factor  ? ?Current Dose per office note 02/21/2022: Arava 20 mg 1 tablet by mouth daily ? ?Labs: Glucose 109, Creat. 1.37, calcium 8.6, GFR 55, Platelet 127 ? ?Okay to refill Arava?  ?

## 2022-04-11 NOTE — Telephone Encounter (Signed)
He was discharged on Lasix '80mg'$  twice daily but told me at his visit today that he is only taking it once daily. He is doing well with this though and I added Spironolactone to help with his BP so I recommended just continuing Lasix '80mg'$  daily. I will send in an updated prescription.  ? ?Thanks so much! ?Lattie Riege ?

## 2022-04-11 NOTE — Telephone Encounter (Signed)
He saw [name] today, so no reason not to refill But it appears he has an active order for furosemide 80 mg twice daily.  ?

## 2022-04-11 NOTE — Progress Notes (Signed)
?Cardiology Office Note:   ? ?Date:  04/11/2022  ? ?ID:  [name], DOB 18-Jun-1951, MRN [id number] ? ?PCP:  [name], MD  ?Cardiologist:  [name], MD  ?Electrophysiologist:  None  ? ?Referring MD: [name], MD  ? ?Chief Complaint: hospital follow-up of chest pain ? ?History of Present Illness:   ? ?[name] is a 71 y.o. male with a history of essentially normal coronaries on recent cardiac catheterization in 0/1779, chronic diastolic CHF with right heart failure/ chronic cor pulmonale, prior DVT with postphlebitic syndrome on Coumadin, chronic lower extremity edema secondary to venous stasis, hypertension, hyperlipidemia COPD, obstructive sleep apnea on CPAP, GERD, IBS, rheumatoid arthritis, and seizure disorder who followed by Dr. Percival Spanish and presents today for hospital follow-up of chest pain. ? ?Patient was recently admitted from 03/11/2022 to 03/13/2022 after presenting with recurrent intermittent episodes of chest pain and shortness of breath often worse at night. High-sensitivity troponin negative x2.  BNP negative.  Chest x-ray showed chronic elevated left hemidiaphragm with left basilar atelectasis but no new acute findings.  Cardiology was consulted and patient underwent LHC on 03/12/2022 which showed patent coronary arteries with no significant CAD.  Chest pain was felt to be noncardiac in nature.  Echo showed LVEF of 55-60% with normal wall motion and normal diastolic parameters as well as normal RV.  His home Lasix was increased to 80 mg twice daily and he was started on Amlodipine for additional BP control. He was also treated with breathing treatments and IV steroids for a COPD exacerbation.  ? ?Patient presents today for follow-up. Here with wife. Patient doing well since discharge. He notes occasional chest heaviness that he states feels like "gas." This is the same sensation he had prior to recent admission but less severe. He states Dicylcomine helps this. No other  chest pain. He has chronic dyspnea with exertion due to his COPD especially if he is rushing. However, no shortness of breath at rest. No orthopnea or PND. His chronic lower extremity edema is stable. No palpitations. He notes occasional mild brief lightheadedness/dizziness with quick position changes but no falls or syncope. ? ?Of note, patient states he has only been taking Lasix '80mg'$  daily not twice daily. However, he states he is doing well with this and thinks his edema and breathing actually improved a little. ? ? ?Past Medical History:  ?Diagnosis Date  ? Arthritis   ? RA  ? Asthma   ? CHF (congestive heart failure) ([place])   ? pt denies   ? Clotting disorder ([place])   ? DVT both legs   ? COPD (chronic obstructive pulmonary disease) ([place])   ? DDD (degenerative disc disease), cervical   ? with UE's paresthesias  ? Diverticulosis   ? DVT, lower extremity ([place])   ? bilat  ? Eye abnormality   ? 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 GERD (gastroesophageal reflux disease)   ? Gout   ? History of measles   ? History of shingles   ? Hypercholesterolemia   ? IBS (irritable bowel syndrome)   ? IBS (irritable bowel syndrome)   ? Peripheral edema   ? Pneumonia   ? hx of   ? Prostate cancer ([place])   ? Rheumatoid arthritis ([place])   ? Seizures ([place])   ? last seizure 20 years ago; on dilantin. Unknown origin.  ? Shortness of breath dyspnea   ? exertion   ?  Sleep apnea   ? cpap   ? Tubular adenoma of colon 04/2008  ? ? ?Past Surgical History:  ?Procedure Laterality Date  ? CHOLECYSTECTOMY    ? CIRCUMCISION    ? COLONOSCOPY    ? CYSTOSCOPY WITH LITHOLAPAXY N/A 03/17/2019  ? Procedure: CYSTOSCOPY WITH LITHOLAPAXY AND REMOVAL OF FOREIGN BODY;  Surgeon: [name], MD;  Location: [facility];  Service: Urology;  Laterality: N/A;  ? DOPPLER ECHOCARDIOGRAPHY N/A 01-21-2012  ? TECHNICALLY DIFFICULT. MILD CONCENTRIC LV HYPERTROPHY. LV CAVITY IS SMALL.. EF=> 55%. TRANSMITRAL SPECTRAL FLOW PATTREN  IS SUGGESTIVE OF IMPAIRED LV RELAXATION. RV SYSTOLIC PRESSURE IS 43PIRJ. LEFT ATRIAL SIZE IS NORMAL. AV APPEARS MILDLY SCLEROTIC. NO SIGN VALVE DISEASE NOTED.  ? LEFT HEART CATH AND CORONARY ANGIOGRAPHY N/A 03/12/2022  ? Procedure: LEFT HEART CATH AND CORONARY ANGIOGRAPHY;  Surgeon: Sherren Mocha, MD;  Location: [facility];  Service: Cardiovascular;  Laterality: N/A;  ? LYMPHADENECTOMY Bilateral 08/30/2015  ? Procedure: PELVIC LYMPHADENECTOMY;  Surgeon: [name], MD;  Location: [facility];  Service: Urology;  Laterality: Bilateral;  ? NUCLEAR STRESS TEST N/A 01-21-2012  ? NORMAL PATTERN OF PERFUSION IN ALL REGIONS. POST STRESS LV SIZE IS NORMAL. NO EVIDENCE OF INDUCIBLE ISCHEMIA. EF 54%.  ? POLYPECTOMY    ? PROSTATE BIOPSY    ? ROBOT ASSISTED LAPAROSCOPIC RADICAL PROSTATECTOMY N/A 08/30/2015  ? Procedure: ROBOTIC ASSISTED LAPAROSCOPIC RADICAL PROSTATECTOMY;  Surgeon: [name], MD;  [facility];  Service: Urology;  Laterality: N/A;  ? URINARY SPHINCTER IMPLANT N/A 03/20/2021  ? Procedure: ARTIFICIAL URINARY SPHINCTER CYSTOSCOPY;  Surgeon: Bjorn Loser, MD;  Location: [facility];  Service: Urology;  Laterality: N/A;  REQUESTING 90 MINS  ? US VENOUS LOWER EXT Right 03/07/11  ? PERSISTENT DVT IN RIGHT LOWER EXT.WITH PERSISTANT VISUALIZATION OF HYPOECHOIC THROMBUS WITHIN THE FEMORAL, PROFUNDA FEMORAL AND POPLITEAL VEINS. WHEN COMPARED TO PREVIOUS, CLOT IS NO LONGER IDENTIFIED WITH IN THE RIGHT CFV.  ? ? ?Current Medications: ?Current Meds  ?Medication Sig  ? acetaminophen (TYLENOL) 650 MG CR tablet Take 1,300 mg by mouth every 8 (eight) hours as needed for pain.  ? alendronate (FOSAMAX) 70 MG tablet Take 70 mg by mouth once a week.  ? allopurinol (ZYLOPRIM) 100 MG tablet TAKE 1 TABLET BY MOUTH TWICE A DAY  ? amLODipine (NORVASC) 5 MG tablet Take 1 tablet (5 mg total) by mouth daily.  ? diclofenac Sodium (VOLTAREN) 1 % GEL Apply 2-4 grams to affected joint 4 times daily as needed. (Patient taking  differently: Apply 2-4 g topically 4 (four) times daily as needed (RA Pain). Apply 2-4 grams to affected joint 4 times daily as needed.)  ? dicyclomine (BENTYL) 10 MG capsule TAKE 1 CAPSULE BY MOUTH 4 TIMES DAILY BEFORE MEALS AND AT BEDTIME (Patient taking differently: Take 20 mg by mouth 2 (two) times daily.)  ? fluticasone furoate-vilanterol (BREO ELLIPTA) 100-25 MCG/ACT AEPB Inhale 1 puff into the lungs daily.  ? folic acid (FOLVITE) 1 MG tablet TAKE 2 TABLETS BY MOUTH EVERY DAY IN THE MORNING  ? INCRUSE ELLIPTA 62.5 MCG/ACT AEPB INHALE 1 PUFF BY MOUTH EVERY DAY  ? leflunomide (ARAVA) 20 MG tablet Take 1 tablet (20 mg total) by mouth daily.  ? omeprazole (PRILOSEC) 40 MG capsule Take 40 mg by mouth at bedtime.  ? oxybutynin (DITROPAN-XL) 10 MG 24 hr tablet Take 1 tablet (10 mg total) by mouth daily.  ? phenytoin (DILANTIN) 100 MG ER capsule Take 100-200 mg by mouth See admin  instructions. Take one capsule in the morning and 2 capsules at bedtime  ? Potassium Chloride ER 20 MEQ TBCR Take 20 mEq by mouth daily.  ? rosuvastatin (CRESTOR) 10 MG tablet Take 10 mg by mouth daily.  ? spironolactone (ALDACTONE) 25 MG tablet Take 0.5 tablets (12.5 mg total) by mouth daily.  ? Tocilizumab (ACTEMRA IV) Inject 4 mg/kg into the vein every 28 (twenty-eight) days. For Rheumatoid Arthritis. Last ordered 06/22/20 x 2 doses  ? warfarin (COUMADIN) 4 MG tablet Take 4 mg by mouth daily.  ? [DISCONTINUED] furosemide (LASIX) 80 MG tablet Take 1 tablet (80 mg total) by mouth 2 (two) times daily.  ?  ? ?Allergies:   Orencia [abatacept], Carbamazepine, Celecoxib, Cephalexin, Enbrel [etanercept], Humira [adalimumab], Levofloxacin, Sulfa antibiotics, and Sulfasalazine  ? ?Social History  ? ?Socioeconomic History  ? Marital status: Married  ?  Spouse name: Not on file  ? Number of children: 3  ? Years of education: Not on file  ? Highest education level: Not on file  ?Occupational History  ? Occupation: retired  ?  Employer: [employer]   ?Tobacco Use  ? Smoking status: Former  ?  Packs/day: 3.00  ?  Years: 20.00  ?  Pack years: 60.00  ?  Types: Cigarettes  ?  Quit date: 12/30/1996  ?  Years since quitting: 25.2  ? Smokeless tobacco: Never  ?Vaping

## 2022-04-18 ENCOUNTER — Ambulatory Visit: Payer: Medicare Other | Admitting: Student

## 2022-04-25 DIAGNOSIS — Z7901 Long term (current) use of anticoagulants: Secondary | ICD-10-CM | POA: Diagnosis not present

## 2022-04-25 DIAGNOSIS — I5032 Chronic diastolic (congestive) heart failure: Secondary | ICD-10-CM | POA: Diagnosis not present

## 2022-04-25 DIAGNOSIS — R600 Localized edema: Secondary | ICD-10-CM | POA: Diagnosis not present

## 2022-04-25 DIAGNOSIS — I10 Essential (primary) hypertension: Secondary | ICD-10-CM | POA: Diagnosis not present

## 2022-04-25 DIAGNOSIS — I87009 Postthrombotic syndrome without complications of unspecified extremity: Secondary | ICD-10-CM | POA: Diagnosis not present

## 2022-04-25 DIAGNOSIS — E785 Hyperlipidemia, unspecified: Secondary | ICD-10-CM | POA: Diagnosis not present

## 2022-04-25 DIAGNOSIS — Z86718 Personal history of other venous thrombosis and embolism: Secondary | ICD-10-CM | POA: Diagnosis not present

## 2022-04-25 DIAGNOSIS — I2781 Cor pulmonale (chronic): Secondary | ICD-10-CM | POA: Diagnosis not present

## 2022-04-25 DIAGNOSIS — G4733 Obstructive sleep apnea (adult) (pediatric): Secondary | ICD-10-CM | POA: Diagnosis not present

## 2022-04-25 DIAGNOSIS — I50812 Chronic right heart failure: Secondary | ICD-10-CM | POA: Diagnosis not present

## 2022-04-26 ENCOUNTER — Other Ambulatory Visit: Payer: Self-pay | Admitting: Pharmacist

## 2022-04-26 ENCOUNTER — Encounter: Payer: Self-pay | Admitting: Cardiovascular Disease

## 2022-04-26 ENCOUNTER — Ambulatory Visit (HOSPITAL_COMMUNITY)
Admission: RE | Admit: 2022-04-26 | Discharge: 2022-04-26 | Disposition: A | Discharge status: Home / Self Care | Payer: Medicare Other | Source: Ambulatory Visit | Attending: Rheumatology | Admitting: Rheumatology

## 2022-04-26 DIAGNOSIS — M0579 Rheumatoid arthritis with rheumatoid factor of multiple sites without organ or systems involvement: Secondary | ICD-10-CM | POA: Insufficient documentation

## 2022-04-26 DIAGNOSIS — Z79899 Other long term (current) drug therapy: Secondary | ICD-10-CM

## 2022-04-26 LAB — BASIC METABOLIC PANEL
BUN/Creatinine Ratio: 12 (ref 10–24)
BUN: 16 mg/dL (ref 8–27)
CO2: 30 mmol/L — ABNORMAL HIGH (ref 20–29)
Calcium: 9.8 mg/dL (ref 8.6–10.2)
Chloride: 97 mmol/L (ref 96–106)
Creatinine, Ser: 1.38 mg/dL — ABNORMAL HIGH (ref 0.76–1.27)
Glucose: 110 mg/dL — ABNORMAL HIGH (ref 70–99)
Potassium: 4.3 mmol/L (ref 3.5–5.2)
Sodium: 145 mmol/L — ABNORMAL HIGH (ref 134–144)
eGFR: 55 mL/min/{1.73_m2} — ABNORMAL LOW (ref >59)

## 2022-04-26 MED ORDER — DIPHENHYDRAMINE HCL 25 MG PO CAPS: 25.0000 mg | ORAL_CAPSULE | ORAL | Status: DC

## 2022-04-26 MED ORDER — TOCILIZUMAB 400 MG/20ML IV SOLN
400.0000 mg | INTRAVENOUS | Status: DC
  Administered 2022-04-26: 400 mg via INTRAVENOUS
  Filled 2022-04-26: qty 20

## 2022-04-26 MED ORDER — ACETAMINOPHEN 325 MG PO TABS: 650.0000 mg | ORAL_TABLET | ORAL | Status: DC

## 2022-04-26 NOTE — Progress Notes (Signed)
Next infusion scheduled for Actemra IV on 05/24/22 and due for updated orders. ?Diagnosis: RA ? ?Dose: '4mg'$ /kg every 28 days ('402mg'$  based on last recorded weight of 100.7 kg) ? ?Last Clinic Visit: 02/21/22 ?Next Clinic Visit: 07/23/22 ? ?Last infusion: 04/26/22 ? ?Labs: CBC on 03/12/22 stable, BMP stable ?TB Gold: negative 02/01/22  ? ?Orders placed for Actemra IV x 3 doses along with premedication of acetaminophen and diphenhydramine to be administered 30 minutes before medication infusion. ? ?Standing CBC with diff/platelet and CMP with GFR orders placed to be drawn every 2 months.  Next TB gold due 02/01/23 ? ?Knox Saliva, PharmD, MPH, BCPS, CPP ?Clinical Pharmacist (Rheumatology and Pulmonology) ? ?

## 2022-04-26 NOTE — Progress Notes (Unsigned)
Called [name] to go over the log of his blood pressure that he submitted for [place].  His blood pressure has been borderline high at about 140-147/82-86, but he has been completely off amlodipine since April 18 after "running out".  He took a whole spironolactone for just 3 consecutive days and then cut back down to half a tablet which is what the bottle says.  His swelling is substantially better after stopping the amlodipine. ? ?Recommend increasing spironolactone to 25 mg daily permanently.  We will check a basic metabolic panel in roughly 2 more weeks (he can check at the [facility] in [place]).  Stay off amlodipine permanently. ?

## 2022-04-27 ENCOUNTER — Other Ambulatory Visit: Payer: Self-pay | Admitting: *Deleted

## 2022-04-27 DIAGNOSIS — I5032 Chronic diastolic (congestive) heart failure: Secondary | ICD-10-CM

## 2022-04-27 MED ORDER — SPIRONOLACTONE 25 MG PO TABS
25.0000 mg | ORAL_TABLET | Freq: Every day | ORAL | 3 refills | Status: DC
Start: 2022-04-27 — End: 2022-07-25

## 2022-05-10 ENCOUNTER — Ambulatory Visit (INDEPENDENT_AMBULATORY_CARE_PROVIDER_SITE_OTHER): Payer: Medicare Other | Admitting: Internal Medicine

## 2022-05-10 ENCOUNTER — Encounter: Payer: Self-pay | Admitting: Internal Medicine

## 2022-05-10 VITALS — BP 130/70 | HR 65 | Temp 97.9°F | Ht 69.0 in | Wt 228.6 lb

## 2022-05-10 DIAGNOSIS — D849 Immunodeficiency, unspecified: Secondary | ICD-10-CM | POA: Diagnosis not present

## 2022-05-10 DIAGNOSIS — R9389 Abnormal findings on diagnostic imaging of other specified body structures: Secondary | ICD-10-CM

## 2022-05-10 DIAGNOSIS — Z8739 Personal history of other diseases of the musculoskeletal system and connective tissue: Secondary | ICD-10-CM

## 2022-05-10 DIAGNOSIS — J441 Chronic obstructive pulmonary disease with (acute) exacerbation: Secondary | ICD-10-CM | POA: Diagnosis not present

## 2022-05-10 DIAGNOSIS — Z8669 Personal history of other diseases of the nervous system and sense organs: Secondary | ICD-10-CM

## 2022-05-10 NOTE — Progress Notes (Signed)
? ?Subjective:  ? ? Patient ID: [name], male    DOB: 1951/02/03, 71 y.o.   MRN: [id number] ? ?HPI ? ?71 yo remote smoker for FU of COPD and severe obstructive sleep apnea ?PMH: GERD, nausea, chronic anticoagulation for DVT, rheumatoid arthritis (on Actemra), DVT, CHF, history of prostate cancer, Serratia sepsis (June/2020) ?Covid pneumonia 2020 ? ?Chief Complaint  ?Patient presents with  ? Follow-up  ?  Occasional dry cough and wheeze. No complaints on CPAP, states it is working well.  ? ? ?Last OV with me 09/2019 reviewed .  He had persistent obstructive events on auto CPAP and this was changed to 12 to 15 cm.  He denies any sleep-related problems today and reports that he is resting well.  He denies daytime sleep pressure or somnolence. ?Breathing is maintained on Breo and Incruse, he reports that these medications are expensive. ?He does report pedal edema, compliant with Lasix ? ?He underwent penile implant surgery which was uneventful from a breathing standpoint ? ?Significant tests/ events reviewed ? ?CT chest 11/2018   Showed elevated Lt hemi-diaphragm  emphysema, mild bronchiectasis and peripheral subpleural reticulation within right lung base which appears slightlyprogressive.  ?  ?PFTs 02/08/2019 - FVC 2.24 (51%), FEV1 1.90 (59%), ratio 85, DLCOunc 71 ?  ?PFTs 11/26/2017 - FVC 2.11 (48%), FEV1 1.77 (54%), ratio 84, DLCOcor 63 ?  ?04/26/2019-Home sleep study- AHI 38.5 an hour, SaO2 low 68%, severe O2 drops, average O2 89% ?  ?06/03/2019-CPAP titration- optimal Pap pressure could not be selected, recommendations: Trial of auto BiPAP 6 to 18 cm with medium full facemask ?  ?02/08/2019-PFT- FVC 2.11 (48% predicted), postbronchodilator ratio 85, postbronchodilator FEV1 1.90 (59% predicted), no bronchodilator response, mid flow reversibility, DLCO 71 ? ? ?ADMISSION MARCH ? ?[name] is a 71 y.o. male with a history of bilateral LE DVTs on coumadin prophylaxis long term, COPD, RA on  immunosuppression, stage IIIa CKD, OSA, gout who presented to the ED 3/13 with recurrent intermittent episodes of chest pressure and shortness of breath, often times worse at night. He was [facility] from [facility] to [facility] for cardiology consultation. Left cardiac catheterization was performed 3/14 showing no obstructive disease. Echocardiogram subsequently revealed normal LV systolic function and no regional wall motion abnormalities. He was given breathing treatments and steroids for wheezing with dyspnea intermittently and has overall improved. He has been cleared for discharge by cardiology and is ambulating without dyspnea or chest discomfort on the day of discharge. See below for full details. ?  ? ?OV 05/10/2022 ? ?Subjective:  ?Patient ID: [name], male , DOB: [birth year] , age 44 y.o. , MRN: [id number] , ADDRESS: [address] ?[place] [address] ?PCP Mark Sites, MD ?Patient Care Team: ?Mark Sites, MD as PCP - General (Family Medicine) ?[name], Mark Gobble, MD as PCP - Cardiology (Cardiology) ?Mark Males, MD as Consulting Physician (Pulmonary Disease) ? ?This Provider for this visit: Treatment Team:  ?Attending Provider: Brand Males, MD ? ? ? ?05/10/2022 -   ?Chief Complaint  ?Patient presents with  ? Hospitalization Follow-up  ?  Pt states he has been doing better after recent hospitalization. States he still has SOB with exertion.  ? ?Chronic multiple medical problems with sleep apnea rheumatoid arthritis on Arava. ? ?HPI ?[name] 71 y.o. -presents with his wife for posthospital follow-up. ?Mid March 2023 he got hospitalized for chest pain and other symptoms.  Apparently cardiac cath was clean.  I reviewed the records.  He ended up  getting treated for a COPD exacerbation according to him his wife and also review the records.  At this point in time he is not on chronic prednisone he is on Breo.  He uses CPAP for his sleep apnea.  Apparently there are plans to go to BiPAP.  He is also  on Incruse.  Last lab work was April 25, 2022 with a creatinine of 1.38.  Hemoglobin was normal mid March 2023 ? ? ?Chest x-ray from mid March visualized.  There is question of some pulmonary left effusion.  Last CT chest was a few years ago and there is question of right lower lobe ILD. ? ? ? ?Currently feeling well and not on oxygen. ? ? ? ?CT Chest data ? ?No results found. ? ? ? ?PFT ? ? ?  Latest Ref Rng & Units 02/08/2019  ? 12:41 PM 11/26/2017  ?  9:33 AM  ?PFT Results  ?FVC-Pre L 2.11   2.16    ?FVC-Predicted Pre % 48   49    ?FVC-Post L 2.24   2.11    ?FVC-Predicted Post % 51   48    ?Pre FEV1/FVC % % 78   82    ?Post FEV1/FCV % % 85   84    ?FEV1-Pre L 1.65   1.77    ?FEV1-Predicted Pre % 51   54    ?FEV1-Post L 1.90   1.77    ?DLCO uncorrected ml/min/mmHg 18.10   19.58    ?DLCO UNC% % 71   63    ?DLCO corrected ml/min/mmHg  19.64    ?DLCO COR %Predicted %  63    ?DLVA Predicted % 115   119    ? ? ? ? ? has a past medical history of Arthritis, Asthma, CHF (congestive heart failure) ([place]), Clotting disorder ([place]), COPD (chronic obstructive pulmonary disease) ([place]), DDD (degenerative disc disease), cervical, Diverticulosis, DVT, lower extremity ([place]), Eye abnormality, GERD (gastroesophageal reflux disease), Gout, History of measles, History of shingles, Hypercholesterolemia, IBS (irritable bowel syndrome), IBS (irritable bowel syndrome), Peripheral edema, Pneumonia, Prostate cancer ([place]), Rheumatoid arthritis ([place]), Seizures ([place]), Shortness of breath dyspnea, Sleep apnea, and Tubular adenoma of colon (04/2008). ? ? reports that he quit smoking about 25 years ago. His smoking use included cigarettes. He has a 60.00 pack-year smoking history. He has never used smokeless tobacco. ? ?Past Surgical History:  ?Procedure Laterality Date  ? CHOLECYSTECTOMY    ? CIRCUMCISION    ? COLONOSCOPY    ? CYSTOSCOPY WITH LITHOLAPAXY N/A 03/17/2019  ? Procedure: CYSTOSCOPY WITH LITHOLAPAXY AND REMOVAL OF FOREIGN BODY;  Surgeon:  [name], MD;  Location: [facility];  Service: Urology;  Laterality: N/A;  ? DOPPLER ECHOCARDIOGRAPHY N/A 01-21-2012  ? TECHNICALLY DIFFICULT. MILD CONCENTRIC LV HYPERTROPHY. LV CAVITY IS SMALL.. EF=> 55%. TRANSMITRAL SPECTRAL FLOW PATTREN IS SUGGESTIVE OF IMPAIRED LV RELAXATION. RV SYSTOLIC PRESSURE IS 36IWOE. LEFT ATRIAL SIZE IS NORMAL. AV APPEARS MILDLY SCLEROTIC. NO SIGN VALVE DISEASE NOTED.  ? LEFT HEART CATH AND CORONARY ANGIOGRAPHY N/A 03/12/2022  ? Procedure: LEFT HEART CATH AND CORONARY ANGIOGRAPHY;  Surgeon: Sherren Mocha, MD;  Location[facility];  Service: Cardiovascular;  Laterality: N/A;  ? LYMPHADENECTOMY Bilateral 08/30/2015  ? Procedure: PELVIC LYMPHADENECTOMY;  Surgeon: [name], MD;  [facility];  Service: Urology;  Laterality: Bilateral;  ? NUCLEAR STRESS TEST N/A 01-21-2012  ? NORMAL PATTERN OF PERFUSION IN ALL REGIONS. POST STRESS LV SIZE IS NORMAL. NO EVIDENCE OF INDUCIBLE  ISCHEMIA. EF 54%.  ? POLYPECTOMY    ? PROSTATE BIOPSY    ? ROBOT ASSISTED LAPAROSCOPIC RADICAL PROSTATECTOMY N/A 08/30/2015  ? Procedure: ROBOTIC ASSISTED LAPAROSCOPIC RADICAL PROSTATECTOMY;  Surgeon: [name], MD;  [facility];  Service: Urology;  Laterality: N/A;  ? URINARY SPHINCTER IMPLANT N/A 03/20/2021  ? Procedure: ARTIFICIAL URINARY SPHINCTER CYSTOSCOPY;  Surgeon: Bjorn Loser, MD;  [facility];  Service: Urology;  Laterality: N/A;  REQUESTING 90 MINS  ? US VENOUS LOWER EXT Right 03/07/11  ? PERSISTENT DVT IN RIGHT LOWER EXT.WITH PERSISTANT VISUALIZATION OF HYPOECHOIC THROMBUS WITHIN THE FEMORAL, PROFUNDA FEMORAL AND POPLITEAL VEINS. WHEN COMPARED TO PREVIOUS, CLOT IS NO LONGER IDENTIFIED WITH IN THE RIGHT CFV.  ? ? ?Allergies  ?Allergen Reactions  ? Orencia [Abatacept] Anaphylaxis  ?  Had prostate cancer  ? Carbamazepine Rash  ?  REACTION: makes drowsy ?Mark NAME IS TEGRETOL  ? Celecoxib Itching and Rash  ?  Mark NAME IS CELEBREX  ? Cephalexin Rash  ?  "Severe  Rash".  Mark NAME IS KEFLEX.   ? Enbrel [Etanercept] Rash  ? Humira [Adalimumab] Rash  ? Levofloxacin Other (See Comments)  ?  REACTION: GI Intolerance ?Mark NAME IS LEVAQUIN  ? Sulfa Antibiotics Rash  ? Sulfasalazine

## 2022-05-10 NOTE — Addendum Note (Signed)
Addended by: Lorretta Harp on: 05/10/2022 10:20 AM ? ? Modules accepted: Orders ? ?

## 2022-05-10 NOTE — Patient Instructions (Signed)
ICD-10-CM   ?1. COPD with acute exacerbation ([place])  J44.1   ?  ?2. Abnormal CT of the chest  R93.89   ?  ?3. History of obstructive sleep apnea  Z86.69   ?  ?4. History of rheumatoid arthritis  Z87.39   ?  ?5. Immunosuppressed status ([place])  D84.9   ?  ? ? ?Resolved copd exacerbation ?But CXR March 2023 was abnormal ?Otehrwise stable ? ?Plan ? - do HRCT supine and prone ? - do full PFT ? - continuie regular medicines ? - sleep apnea care per Dr Elsworth Soho ? ?Followup ? - finish tests and see Dr Elsworth Soho ?

## 2022-05-16 DIAGNOSIS — Z7901 Long term (current) use of anticoagulants: Secondary | ICD-10-CM | POA: Diagnosis not present

## 2022-05-24 ENCOUNTER — Ambulatory Visit (HOSPITAL_COMMUNITY)
Admission: RE | Admit: 2022-05-24 | Discharge: 2022-05-24 | Disposition: A | Discharge status: Home / Self Care | Payer: Medicare Other | Source: Ambulatory Visit | Attending: Rheumatology | Admitting: Rheumatology

## 2022-05-24 DIAGNOSIS — Z79899 Other long term (current) drug therapy: Secondary | ICD-10-CM | POA: Insufficient documentation

## 2022-05-24 DIAGNOSIS — M0579 Rheumatoid arthritis with rheumatoid factor of multiple sites without organ or systems involvement: Secondary | ICD-10-CM | POA: Insufficient documentation

## 2022-05-24 LAB — CBC WITH DIFFERENTIAL/PLATELET
Abs Immature Granulocytes: 0.02 10*3/uL (ref 0.00–0.07)
Basophils Absolute: 0.1 10*3/uL (ref 0.0–0.1)
Basophils Relative: 1 %
Eosinophils Absolute: 0.5 10*3/uL (ref 0.0–0.5)
Eosinophils Relative: 8 %
HCT: 46.5 % (ref 39.0–52.0)
Hemoglobin: 15.1 g/dL (ref 13.0–17.0)
Immature Granulocytes: 0 %
Lymphocytes Relative: 18 %
Lymphs Abs: 1.2 10*3/uL (ref 0.7–4.0)
MCH: 29.9 pg (ref 26.0–34.0)
MCHC: 32.5 g/dL (ref 30.0–36.0)
MCV: 92.1 fL (ref 80.0–100.0)
Monocytes Absolute: 1 10*3/uL (ref 0.1–1.0)
Monocytes Relative: 15 %
Neutro Abs: 3.8 10*3/uL (ref 1.7–7.7)
Neutrophils Relative %: 58 %
Platelets: 168 10*3/uL (ref 150–400)
RBC: 5.05 MIL/uL (ref 4.22–5.81)
RDW: 14.4 % (ref 11.5–15.5)
WBC: 6.6 10*3/uL (ref 4.0–10.5)
nRBC: 0 % (ref 0.0–0.2)

## 2022-05-24 LAB — COMPREHENSIVE METABOLIC PANEL
ALT: 31 U/L (ref 0–44)
AST: 39 U/L (ref 15–41)
Albumin: 4 g/dL (ref 3.5–5.0)
Alkaline Phosphatase: 62 U/L (ref 38–126)
Anion gap: 10 (ref 5–15)
BUN: 13 mg/dL (ref 8–23)
CO2: 33 mmol/L — ABNORMAL HIGH (ref 22–32)
Calcium: 9.4 mg/dL (ref 8.9–10.3)
Chloride: 99 mmol/L (ref 98–111)
Creatinine, Ser: 1.25 mg/dL — ABNORMAL HIGH (ref 0.61–1.24)
GFR, Estimated: >60 mL/min (ref >60)
Glucose, Bld: 113 mg/dL — ABNORMAL HIGH (ref 70–99)
Potassium: 3.9 mmol/L (ref 3.5–5.1)
Sodium: 142 mmol/L (ref 135–145)
Total Bilirubin: 0.4 mg/dL (ref 0.3–1.2)
Total Protein: 6.4 g/dL — ABNORMAL LOW (ref 6.5–8.1)

## 2022-05-24 MED ORDER — ACETAMINOPHEN 325 MG PO TABS: 650.0000 mg | ORAL_TABLET | ORAL | Status: DC

## 2022-05-24 MED ORDER — TOCILIZUMAB 400 MG/20ML IV SOLN
400.0000 mg | INTRAVENOUS | Status: DC
  Administered 2022-05-24: 400 mg via INTRAVENOUS
  Filled 2022-05-24: qty 20

## 2022-05-24 MED ORDER — DIPHENHYDRAMINE HCL 25 MG PO CAPS: 25.0000 mg | ORAL_CAPSULE | ORAL | Status: DC

## 2022-05-24 NOTE — Progress Notes (Signed)
CBC WNL.  Glucose is 113.   Creatinine remains elevated but has improved.  GFR is WNL.   Total protein borderline low.  Rest of CMP WNL.

## 2022-05-28 DIAGNOSIS — Z7901 Long term (current) use of anticoagulants: Secondary | ICD-10-CM | POA: Diagnosis not present

## 2022-06-11 ENCOUNTER — Other Ambulatory Visit: Payer: Self-pay | Admitting: Physician Assistant

## 2022-06-11 DIAGNOSIS — H109 Unspecified conjunctivitis: Secondary | ICD-10-CM | POA: Diagnosis not present

## 2022-06-11 DIAGNOSIS — E6609 Other obesity due to excess calories: Secondary | ICD-10-CM | POA: Diagnosis not present

## 2022-06-11 DIAGNOSIS — Z6835 Body mass index (BMI) 35.0-35.9, adult: Secondary | ICD-10-CM | POA: Diagnosis not present

## 2022-06-11 DIAGNOSIS — L309 Dermatitis, unspecified: Secondary | ICD-10-CM | POA: Diagnosis not present

## 2022-06-11 NOTE — Telephone Encounter (Signed)
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  Glucose is 113.   Creatinine remains elevated but has improved.  GFR is WNL.   Total protein borderline low.  Rest of CMP WNL.   Okay to refill Folic Acid, Fosamax and Allopurinol?

## 2022-06-18 ENCOUNTER — Ambulatory Visit (HOSPITAL_COMMUNITY)
Admission: RE | Admit: 2022-06-18 | Discharge: 2022-06-18 | Disposition: A | Discharge status: Home / Self Care | Payer: Medicare Other | Source: Ambulatory Visit | Attending: Internal Medicine | Admitting: Internal Medicine

## 2022-06-18 DIAGNOSIS — R911 Solitary pulmonary nodule: Secondary | ICD-10-CM | POA: Diagnosis not present

## 2022-06-18 DIAGNOSIS — I700 Atherosclerosis of aorta: Secondary | ICD-10-CM | POA: Diagnosis not present

## 2022-06-18 DIAGNOSIS — R0602 Shortness of breath: Secondary | ICD-10-CM | POA: Diagnosis not present

## 2022-06-18 DIAGNOSIS — I2510 Atherosclerotic heart disease of native coronary artery without angina pectoris: Secondary | ICD-10-CM | POA: Diagnosis not present

## 2022-06-18 DIAGNOSIS — R9389 Abnormal findings on diagnostic imaging of other specified body structures: Secondary | ICD-10-CM | POA: Insufficient documentation

## 2022-06-21 ENCOUNTER — Ambulatory Visit (HOSPITAL_COMMUNITY)
Admission: RE | Admit: 2022-06-21 | Discharge: 2022-06-21 | Disposition: A | Discharge status: Home / Self Care | Payer: Medicare Other | Source: Ambulatory Visit | Attending: Rheumatology | Admitting: Rheumatology

## 2022-06-21 DIAGNOSIS — M0579 Rheumatoid arthritis with rheumatoid factor of multiple sites without organ or systems involvement: Secondary | ICD-10-CM | POA: Diagnosis not present

## 2022-06-21 MED ORDER — ACETAMINOPHEN 325 MG PO TABS: 650.0000 mg | ORAL_TABLET | ORAL | Status: DC

## 2022-06-21 MED ORDER — DIPHENHYDRAMINE HCL 25 MG PO CAPS: 25.0000 mg | ORAL_CAPSULE | ORAL | Status: DC

## 2022-06-21 MED ORDER — TOCILIZUMAB 400 MG/20ML IV SOLN
400.0000 mg | INTRAVENOUS | Status: DC
  Administered 2022-06-21: 400 mg via INTRAVENOUS
  Filled 2022-06-21: qty 20

## 2022-06-21 NOTE — Progress Notes (Signed)
Pt arrived with BP 161/122 after checking twice.Pt has taken his morning BP meds at home. Messaged Chesley Mires at Dr Fatima Sanger office.  We were informed that it was ok to proceed with Actemra and to recheck VS during and after infusion. BP at 1031 155/82

## 2022-06-24 ENCOUNTER — Other Ambulatory Visit: Payer: Self-pay | Admitting: Rheumatology

## 2022-06-24 ENCOUNTER — Other Ambulatory Visit: Payer: Self-pay | Admitting: Pulmonary Disease

## 2022-06-24 MED ORDER — LEFLUNOMIDE 20 MG PO TABS: 20.0000 mg | ORAL_TABLET | Freq: Every day | ORAL | 0 refills | Status: DC

## 2022-07-09 NOTE — Progress Notes (Unsigned)
Office Visit Note  Patient: [name]             Date of Birth: 01-26-1951           MRN: [id number]             PCP: [name], MD Referring: [name], MD Visit Date: 07/23/2022 Occupation: '@GUAROCC'$ @  Subjective:  Neck pain   History of Present Illness: [name] is a 71 y.o. male with history of seropositive rheumatoid arthritis, gout, and osteoarthritis. He remains on actemra 4 mg/kg IV infusions every 4 weeks and arava 20 mg 1 tablet by mouth daily.  He is tolerating combination therapy without any side effects.  He has not had any recurrent infections.  He denies any signs or symptoms of a rheumatoid arthritis flare.  He states that he has been experiencing increased neck pain and stiffness over the past several weeks.  He has not had any recent injury.  He denies any symptoms of radiculopathy currently.  He has had some increased difficulty sleeping at night due to the discomfort.  He denies any other joint pain or joint swelling at this time.  He denies any recent gout flares and remains on allopurinol 200 mg daily. He denies any recent falls or fractures.  He is taking Fosamax 70 mg 1 tablet once weekly for osteoporosis.  He has not had an updated bone density yet.    Activities of Daily Living:  Patient reports morning stiffness for all day. Patient Denies nocturnal pain.  Difficulty dressing/grooming: Denies Difficulty climbing stairs: Denies Difficulty getting out of chair: Denies Difficulty using hands for taps, buttons, cutlery, and/or writing: Denies  Review of Systems  Constitutional:  Positive for fatigue. Negative for night sweats.  HENT:  Negative for mouth sores, mouth dryness and nose dryness.   Eyes:  Negative for redness and dryness.  Respiratory:  Negative for difficulty breathing.   Cardiovascular:  Negative for chest pain, palpitations, hypertension, irregular heartbeat and swelling in legs/feet.  Gastrointestinal:  Negative for  blood in stool, constipation and diarrhea.  Endocrine: Negative for increased urination.  Genitourinary:  Negative for painful urination and involuntary urination.  Musculoskeletal:  Positive for morning stiffness. Negative for joint pain, gait problem, joint pain, joint swelling, myalgias, muscle weakness, muscle tenderness and myalgias.  Skin:  Negative for color change, rash, hair loss, nodules/bumps, skin tightness, ulcers and sensitivity to sunlight.  Allergic/Immunologic: Negative for susceptible to infections.  Neurological:  Negative for dizziness, fainting, headaches, memory loss, night sweats and weakness.  Hematological:  Positive for bruising/bleeding tendency. Negative for swollen glands.  Psychiatric/Behavioral:  Negative for depressed mood and sleep disturbance. The patient is not nervous/anxious.     PMFS History:  Patient Active Problem List   Diagnosis Date Noted   Chest pain 03/11/2022   Stage 3a chronic kidney disease (CKD) ([place]) 03/11/2022   Bronchiectasis without complication ([place]) 96/29/5284   Incontinence when straining, male 03/20/2021   Erectile dysfunction after radical prostatectomy 09/08/2020   OAB (overactive bladder) 02/23/2020   Class 2 severe obesity due to excess calories with serious comorbidity and body mass index (BMI) of 35.0 to 35.9 in adult [facility])    Chronic diastolic HF (heart failure) ([place])    Serratia sepsis ([place]) 06/08/2019   Bacteremia due to Gram-negative bacteria 06/08/2019   Voice hoarseness 04/26/2019   OSA (obstructive sleep apnea) 03/10/2019   Abnormal CT of the chest 02/09/2019   Dyspnea on exertion 10/22/2017   History  of seizure disorder 02/27/2017   Primary osteoarthritis of both hands 02/27/2017   Primary osteoarthritis of both feet 02/27/2017   Idiopathic gout of multiple sites 02/27/2017   High risk medication use 12/29/2016   History of prostate cancer 12/29/2016   Primary osteoarthritis of both knees 12/29/2016   Chronic  idiopathic gout involving toe without tophus 12/29/2016   History of CHF (congestive heart failure) 12/29/2016   History of COPD 12/29/2016   Plantar pustular psoriasis 12/29/2016   DVT (deep venous thrombosis) ([place]) 10/31/2016   COPD with acute exacerbation ([place]) 10/31/2016   CHF (congestive heart failure) ([place]) 10/31/2016   Prostate cancer ([place]) 08/30/2015   Malignant neoplasm of prostate ([place]) 07/04/2015   Chest pain at rest 07/05/2014   Weakness 33/82/5053   Complicated postphlebitic syndrome 11/16/2013   Personal history of DVT (deep vein thrombosis) 05/18/2013   Chronic anticoagulation 05/18/2013   Diverticulosis of colon without hemorrhage 05/18/2013   Rheumatoid arthritis ([place]) 05/18/2013   Seizure disorder ([place]) 05/18/2013   Hx of adenomatous colonic polyps 05/18/2013   GERD 05/08/2010   NAUSEA 05/08/2010   FLATULENCE-GAS-BLOATING 05/08/2010    Past Medical History:  Diagnosis Date   Arthritis    RA   Asthma    CHF (congestive heart failure) ([place])    pt denies    Clotting disorder ([place])    DVT both legs    COPD (chronic obstructive pulmonary disease) ([place])    DDD (degenerative disc disease), cervical    with UE's paresthesias   Diverticulosis    DVT, lower extremity ([place])    bilat   Eye abnormality    right eye drifts has difficulty focusing with right eye has had since birth    GERD (gastroesophageal reflux disease)    Gout    History of measles    History of shingles    Hypercholesterolemia    IBS (irritable bowel syndrome)    IBS (irritable bowel syndrome)    Peripheral edema    Pneumonia    hx of    Prostate cancer ([place])    Rheumatoid arthritis ([place])    Seizures ([place])    last seizure 20 years ago; on dilantin. Unknown origin.   Shortness of breath dyspnea    exertion    Sleep apnea    cpap    Tubular adenoma of colon 04/2008    Family History  Problem Relation Age of Onset   Skin cancer Father    Stomach cancer Father    Heart attack Father     Alzheimer's disease Mother    Throat cancer Brother    Stomach cancer Brother    Lung cancer Brother    Heart attack Brother    Heart attack Brother    Breast cancer Sister    Heart attack Sister    Stroke Sister    Bladder Cancer Sister    Heart murmur Sister    Colon cancer Brother    Colon polyps Neg Hx    Past Surgical History:  Procedure Laterality Date   CHOLECYSTECTOMY     CIRCUMCISION     COLONOSCOPY     CYSTOSCOPY WITH LITHOLAPAXY N/A 03/17/2019   Procedure: CYSTOSCOPY WITH LITHOLAPAXY AND REMOVAL OF FOREIGN BODY;  Surgeon: [name], MD;  Location: [facility];  Service: Urology;  Laterality: N/A;   DOPPLER ECHOCARDIOGRAPHY N/A 01-21-2012   TECHNICALLY DIFFICULT. MILD CONCENTRIC LV HYPERTROPHY. LV CAVITY IS SMALL.. EF=> 55%. TRANSMITRAL SPECTRAL FLOW PATTREN IS SUGGESTIVE OF IMPAIRED LV RELAXATION.  RV SYSTOLIC PRESSURE IS 38BOFB. LEFT ATRIAL SIZE IS NORMAL. AV APPEARS MILDLY SCLEROTIC. NO SIGN VALVE DISEASE NOTED.   LEFT HEART CATH AND CORONARY ANGIOGRAPHY N/A 03/12/2022   Procedure: LEFT HEART CATH AND CORONARY ANGIOGRAPHY;  Surgeon: Sherren Mocha, MD;  Location: [facility];  Service: Cardiovascular;  Laterality: N/A;   LYMPHADENECTOMY Bilateral 08/30/2015   Procedure: PELVIC LYMPHADENECTOMY;  Surgeon: [name], MD;  Location: [facility];  Service: Urology;  Laterality: Bilateral;   NUCLEAR STRESS TEST N/A 01-21-2012   NORMAL PATTERN OF PERFUSION IN ALL REGIONS. POST STRESS LV SIZE IS NORMAL. NO EVIDENCE OF INDUCIBLE ISCHEMIA. EF 54%.   POLYPECTOMY     PROSTATE BIOPSY     ROBOT ASSISTED LAPAROSCOPIC RADICAL PROSTATECTOMY N/A 08/30/2015   Procedure: ROBOTIC ASSISTED LAPAROSCOPIC RADICAL PROSTATECTOMY;  Surgeon: [name], MD;  Location: [facility];  Service: Urology;  Laterality: N/A;   URINARY SPHINCTER IMPLANT N/A 03/20/2021   Procedure: ARTIFICIAL URINARY SPHINCTER CYSTOSCOPY;  Surgeon: Bjorn Loser, MD;  Location: [facility];  Service: Urology;   Laterality: N/A;  REQUESTING 90 MINS   US VENOUS LOWER EXT Right 03/07/11   PERSISTENT DVT IN RIGHT LOWER EXT.WITH PERSISTANT VISUALIZATION OF HYPOECHOIC THROMBUS WITHIN THE FEMORAL, PROFUNDA FEMORAL AND POPLITEAL VEINS. WHEN COMPARED TO PREVIOUS, CLOT IS NO LONGER IDENTIFIED WITH IN THE RIGHT CFV.   Social History   Social History Narrative   Not on file   Immunization History  Administered Date(s) Administered   Fluad Quad(high Dose 65+) 09/30/2019   Influenza, High Dose Seasonal PF 10/05/2018, 10/10/2021   Influenza,inj,Quad PF,6+ Mos 08/31/2015   Influenza-Unspecified 09/30/2016, 08/22/2017   Moderna Sars-Covid-2 Vaccination 01/21/2020, 02/18/2020, 10/25/2020, 10/10/2021   Pneumococcal Conjugate-13 10/05/2018   Pneumococcal-Unspecified 12/30/2012   Td 07/05/1996     Objective: Vital Signs: BP (!) 167/91 (BP Location: Left Arm, Patient Position: Sitting, Cuff Size: Normal)   Pulse 61   Ht '5\' 9"'$  (1.753 m)   Wt 231 lb 3.2 oz (104.9 kg)   BMI 34.14 kg/m    Physical Exam Vitals and nursing note reviewed.  Constitutional:      Appearance: He is well-developed.  HENT:     Head: Normocephalic and atraumatic.  Eyes:     Conjunctiva/sclera: Conjunctivae normal.     Pupils: Pupils are equal, round, and reactive to light.  Cardiovascular:     Rate and Rhythm: Normal rate and regular rhythm.     Heart sounds: Normal heart sounds.  Pulmonary:     Effort: Pulmonary effort is normal.     Breath sounds: Normal breath sounds.  Abdominal:     General: Bowel sounds are normal.     Palpations: Abdomen is soft.  Musculoskeletal:     Cervical back: Normal range of motion and neck supple.  Skin:    General: Skin is warm and dry.     Capillary Refill: Capillary refill takes less than 2 seconds.  Neurological:     Mental Status: He is alert and oriented to person, place, and time.  Psychiatric:        Behavior: Behavior normal.      Musculoskeletal Exam: C-spine has limited range  of motion especially with lateral rotation.  Postural thoracic kyphosis noted.  Midline spinal tenderness over the C-spine and thoracic spine.  Shoulder joints have good range of motion with some discomfort and tenderness over the right subacromial bursa.  Elbow joints have good range of motion with no tenderness or inflammation.  Wrist joints have good  range of motion with no tenderness or synovitis.  No tenderness or synovitis over MCP or PIP joints.  PIP and DIP thickening consistent with osteoarthritis of both hands.  Hip joints have slightly limited range of motion but no groin pain.  Knee joints have good range of motion with no warmth or effusion.  Ankle joints have good range of motion with no joint tenderness.  Pedal edema noted bilaterally.  CDAI Exam: CDAI Score: 0.2  Patient Global: 1 mm; Provider Global: 1 mm Swollen: 0 ; Tender: 0  Joint Exam 07/23/2022   No joint exam has been documented for this visit   There is currently no information documented on the homunculus. Go to the Rheumatology activity and complete the homunculus joint exam.  Investigation: No additional findings.  Imaging: No results found.  Recent Labs: Lab Results  Component Value Date   WBC 5.6 07/19/2022   HGB 15.2 07/19/2022   PLT 158 07/19/2022   NA 139 07/19/2022   K 4.3 07/19/2022   CL 101 07/19/2022   CO2 30 07/19/2022   GLUCOSE 133 (H) 07/19/2022   BUN 12 07/19/2022   CREATININE 1.36 (H) 07/19/2022   BILITOT 0.9 07/19/2022   ALKPHOS 56 07/19/2022   AST 53 (H) 07/19/2022   ALT 35 07/19/2022   PROT 6.8 07/19/2022   ALBUMIN 3.8 07/19/2022   CALCIUM 9.0 07/19/2022   GFRAA >60 09/20/2020   QFTBGOLDPLUS Negative 02/01/2022    Speciality Comments: ACTEMRA '4mg'$ /kg x 4 weeks PPD negative 01/02/17  Procedures:  No procedures performed Allergies: Orencia [abatacept], Carbamazepine, Celecoxib, Cephalexin, Enbrel [etanercept], Humira [adalimumab], Levofloxacin, Sulfa antibiotics, and Sulfasalazine      Assessment / Plan:     Visit Diagnoses: Rheumatoid arthritis involving multiple sites with positive rheumatoid factor ([place]): He has no synovitis on examination today.  He has not had any recent rheumatoid arthritis flares.  He has clinically been doing well on Actemra 4 mg/kg IV infusions every 4 weeks and Arava 20 mg 1 tablet by mouth daily.  He is tolerating combination therapy without any side effects.  His last infusion was scheduled on 07/19/2022.  Overall he continues to find his rheumatoid arthritis to be adequately controlled on combination therapy.  No medication changes will be made at this time.  He was advised to notify us if he develops signs or symptoms of a flare.  He will follow-up in the office in 5 months or sooner if needed.  High risk medication use - Actemra '4mg'$ /kg IV infusions every 4 weeks and Arava 20 mg 1 tablet by mouth daily. CBC and CMP updated with last infusion on 07/19/22.  Results were discussed with the patient today in the office.  Discussed the importance of avoiding NSAID use as well as the use of Tylenol and alcohol use.  We will continue to monitor lab work closely.  If his LFTs remain elevated we will discuss reducing the dose of Arava in the future. TB gold negative on 02/01/22 and will continue to be monitored yearly. He has not had any recent or recurrent infections.  Discussed the importance of holding Actemra and Arava if he develops signs or symptoms of an infection and to resume once the infection has completely cleared.  Chronic idiopathic gout involving toe without tophus, unspecified laterality - He has not had any signs or symptoms of a gout flare.  He is clinically doing well taking allopurinol 100 mg 2 tablets daily for management of gout. uric acid: 11/30/2021 5.9.  He  was advised to notify us if he develops signs or symptoms of a gout flare.  Primary osteoarthritis of both hands: He has PIP and DIP thickening consistent with osteoarthritis of both  hands.  [place] joint prominence bilaterally.  Discussed the importance of joint protection and muscle strengthening.  Primary osteoarthritis of both knees: He has good range of motion of both knee joints on examination today.  No warmth or effusion was noted.  Primary osteoarthritis of both feet: He is not experiencing any discomfort in his feet at this time.  Pedal edema was noted bilaterally.  Age-related osteoporosis without current pathological fracture - DEXA on 09/28/2019 T-score -2.1 right femur neck with +12 % change in BMD.  DEXA ordered.  DEXA was due to be updated in 2022. He is taking fosamax 70 mg 1 tablet by mouth once weekly for management of osteoporosis.   Neck pain - He presents today with increased neck pain and stiffness.  He has not had any recent injury prior to the onset of symptoms.  He has been experiencing some increased discomfort at night while trying to get comfortable.  He is not experiencing any symptoms of radiculopathy at this time.  He has been avoiding the use of Tylenol and NSAIDs as advised.  X-rays of the C-spine were obtained today for further evaluation.  He has multilevel spondylosis and facet joint arthropathy.  Results were discussed with the patient today in detail and recommendations were provided.  He would like a referral to physical therapy which alleviated his thoracic and lumbar spine discomfort in the past.  Referral will be placed today.  He was advised to notify us if he develops any new or worsening symptoms.  Plan: XR Cervical Spine 2 or 3 views  Other medical conditions are listed as follows:   History of abscessed tooth - 11/03/21-Clearance received on 10/3021 to resume arava and actemra.     History of COPD - Has been followed by pulmonologist.  History of adenomatous polyp of colon  History of seizure disorder  History of CHF (congestive heart failure) - He is not a good candidate for TNF inhibitors.  History of prostate cancer  History of  DVT (deep vein thrombosis) - he is on warfarin.  Not a good candidate for the use of Jak inhibitors.   Orders: Orders Placed This Encounter  Procedures   XR Cervical Spine 2 or 3 views   No orders of the defined types were placed in this encounter.   Follow-Up Instructions: Return in about 5 months (around 12/23/2022) for Rheumatoid arthritis, Gout, Osteoarthritis.   Ofilia Neas, PA-C  Note - This record has been created using Dragon software.  Chart creation errors have been sought, but may not always  have been located. Such creation errors do not reflect on  the standard of medical care.

## 2022-07-19 ENCOUNTER — Encounter (HOSPITAL_COMMUNITY)
Admission: RE | Admit: 2022-07-19 | Discharge: 2022-07-19 | Disposition: A | Discharge status: Home / Self Care | Payer: Medicare Other | Source: Ambulatory Visit | Attending: Rheumatology | Admitting: Rheumatology

## 2022-07-19 ENCOUNTER — Other Ambulatory Visit: Payer: Self-pay | Admitting: Pharmacist

## 2022-07-19 VITALS — BP 169/88 | HR 62 | Temp 98.1°F | Resp 18 | Wt 230.0 lb

## 2022-07-19 DIAGNOSIS — Z79899 Other long term (current) drug therapy: Secondary | ICD-10-CM | POA: Insufficient documentation

## 2022-07-19 DIAGNOSIS — M0579 Rheumatoid arthritis with rheumatoid factor of multiple sites without organ or systems involvement: Secondary | ICD-10-CM

## 2022-07-19 DIAGNOSIS — M19041 Primary osteoarthritis, right hand: Secondary | ICD-10-CM

## 2022-07-19 LAB — COMPREHENSIVE METABOLIC PANEL
ALT: 35 U/L (ref 0–44)
AST: 53 U/L — ABNORMAL HIGH (ref 15–41)
Albumin: 3.8 g/dL (ref 3.5–5.0)
Alkaline Phosphatase: 56 U/L (ref 38–126)
Anion gap: 8 (ref 5–15)
BUN: 12 mg/dL (ref 8–23)
CO2: 30 mmol/L (ref 22–32)
Calcium: 9 mg/dL (ref 8.9–10.3)
Chloride: 101 mmol/L (ref 98–111)
Creatinine, Ser: 1.36 mg/dL — ABNORMAL HIGH (ref 0.61–1.24)
GFR, Estimated: 56 mL/min — ABNORMAL LOW (ref >60)
Glucose, Bld: 133 mg/dL — ABNORMAL HIGH (ref 70–99)
Potassium: 4.3 mmol/L (ref 3.5–5.1)
Sodium: 139 mmol/L (ref 135–145)
Total Bilirubin: 0.9 mg/dL (ref 0.3–1.2)
Total Protein: 6.8 g/dL (ref 6.5–8.1)

## 2022-07-19 LAB — CBC WITH DIFFERENTIAL/PLATELET
Abs Immature Granulocytes: 0.01 10*3/uL (ref 0.00–0.07)
Basophils Absolute: 0.1 10*3/uL (ref 0.0–0.1)
Basophils Relative: 1 %
Eosinophils Absolute: 0.6 10*3/uL — ABNORMAL HIGH (ref 0.0–0.5)
Eosinophils Relative: 11 %
HCT: 45 % (ref 39.0–52.0)
Hemoglobin: 15.2 g/dL (ref 13.0–17.0)
Immature Granulocytes: 0 %
Lymphocytes Relative: 21 %
Lymphs Abs: 1.2 10*3/uL (ref 0.7–4.0)
MCH: 30.3 pg (ref 26.0–34.0)
MCHC: 33.8 g/dL (ref 30.0–36.0)
MCV: 89.8 fL (ref 80.0–100.0)
Monocytes Absolute: 0.9 10*3/uL (ref 0.1–1.0)
Monocytes Relative: 16 %
Neutro Abs: 2.9 10*3/uL (ref 1.7–7.7)
Neutrophils Relative %: 51 %
Platelets: 158 10*3/uL (ref 150–400)
RBC: 5.01 MIL/uL (ref 4.22–5.81)
RDW: 14.6 % (ref 11.5–15.5)
WBC: 5.6 10*3/uL (ref 4.0–10.5)
nRBC: 0 % (ref 0.0–0.2)

## 2022-07-19 MED ORDER — TOCILIZUMAB 400 MG/20ML IV SOLN
400.0000 mg | INTRAVENOUS | Status: DC
  Administered 2022-07-19: 400 mg via INTRAVENOUS
  Filled 2022-07-19: qty 20

## 2022-07-19 MED ORDER — ACETAMINOPHEN 325 MG PO TABS: 650.0000 mg | ORAL_TABLET | ORAL | Status: DC

## 2022-07-19 MED ORDER — DIPHENHYDRAMINE HCL 25 MG PO CAPS: 25.0000 mg | ORAL_CAPSULE | ORAL | Status: DC

## 2022-07-19 NOTE — Progress Notes (Signed)
Next infusion scheduled for Actemra IV on 08/16/22 and due for updated orders. Diagnosis: RA  Dose: '4mg'$ /kg every 28 days  Last Clinic Visit: 02/21/22 Next Clinic Visit: 07/23/22  Last infusion: 07/19/22  Labs: CBC and CMP on 07/19/22 - CBC wnl, CMP stable TB Gold: negative on 02/01/22  Orders placed for Actemra IV x 3 doses along with premedication of acetaminophen and diphenhydramine to be administered 30 minutes before medication infusion.  Standing CBC with diff/platelet and CMP with GFR orders placed to be drawn every 2 months.  Next TB gold due 02/01/23  Knox Saliva, PharmD, MPH, BCPS, CPP Clinical Pharmacist (Rheumatology and Pulmonology)

## 2022-07-19 NOTE — Progress Notes (Signed)
CBC is normal, GFR is low and stable.  Please forward results to his PCP.

## 2022-07-23 ENCOUNTER — Encounter: Payer: Self-pay | Admitting: Physician Assistant

## 2022-07-23 ENCOUNTER — Ambulatory Visit (INDEPENDENT_AMBULATORY_CARE_PROVIDER_SITE_OTHER): Payer: Medicare Other

## 2022-07-23 ENCOUNTER — Ambulatory Visit (INDEPENDENT_AMBULATORY_CARE_PROVIDER_SITE_OTHER): Payer: Medicare Other | Admitting: Physician Assistant

## 2022-07-23 VITALS — BP 167/91 | HR 61 | Ht 69.0 in | Wt 231.2 lb

## 2022-07-23 DIAGNOSIS — M0579 Rheumatoid arthritis with rheumatoid factor of multiple sites without organ or systems involvement: Secondary | ICD-10-CM

## 2022-07-23 DIAGNOSIS — M810 Age-related osteoporosis without current pathological fracture: Secondary | ICD-10-CM | POA: Diagnosis not present

## 2022-07-23 DIAGNOSIS — M542 Cervicalgia: Secondary | ICD-10-CM

## 2022-07-23 DIAGNOSIS — M19041 Primary osteoarthritis, right hand: Secondary | ICD-10-CM | POA: Diagnosis not present

## 2022-07-23 DIAGNOSIS — Z8679 Personal history of other diseases of the circulatory system: Secondary | ICD-10-CM

## 2022-07-23 DIAGNOSIS — Z8669 Personal history of other diseases of the nervous system and sense organs: Secondary | ICD-10-CM | POA: Diagnosis not present

## 2022-07-23 DIAGNOSIS — Z86010 Personal history of colonic polyps: Secondary | ICD-10-CM | POA: Diagnosis not present

## 2022-07-23 DIAGNOSIS — Z8546 Personal history of malignant neoplasm of prostate: Secondary | ICD-10-CM

## 2022-07-23 DIAGNOSIS — M170 Bilateral primary osteoarthritis of knee: Secondary | ICD-10-CM | POA: Diagnosis not present

## 2022-07-23 DIAGNOSIS — Z8709 Personal history of other diseases of the respiratory system: Secondary | ICD-10-CM

## 2022-07-23 DIAGNOSIS — M19071 Primary osteoarthritis, right ankle and foot: Secondary | ICD-10-CM

## 2022-07-23 DIAGNOSIS — Z8719 Personal history of other diseases of the digestive system: Secondary | ICD-10-CM

## 2022-07-23 DIAGNOSIS — M19072 Primary osteoarthritis, left ankle and foot: Secondary | ICD-10-CM

## 2022-07-23 DIAGNOSIS — M1A0790 Idiopathic chronic gout, unspecified ankle and foot, without tophus (tophi): Secondary | ICD-10-CM

## 2022-07-23 DIAGNOSIS — Z79899 Other long term (current) drug therapy: Secondary | ICD-10-CM

## 2022-07-23 DIAGNOSIS — M19042 Primary osteoarthritis, left hand: Secondary | ICD-10-CM

## 2022-07-23 DIAGNOSIS — Z86718 Personal history of other venous thrombosis and embolism: Secondary | ICD-10-CM

## 2022-07-23 NOTE — Patient Instructions (Signed)
Neck Exercises Ask your health care provider which exercises are safe for you. Do exercises exactly as told by your health care provider and adjust them as directed. It is normal to feel mild stretching, pulling, tightness, or discomfort as you do these exercises. Stop right away if you feel sudden pain or your pain gets worse. Do not begin these exercises until told by your health care provider. Neck exercises can be important for many reasons. They can improve strength and maintain flexibility in your neck, which will help your upper back and prevent neck pain. Stretching exercises Rotation neck stretching  Sit in a chair or stand up. Place your feet flat on the floor, shoulder-width apart. Slowly turn your head (rotate) to the right until a slight stretch is felt. Turn it all the way to the right so you can look over your right shoulder. Do not tilt or tip your head. Hold this position for 10-30 seconds. Slowly turn your head (rotate) to the left until a slight stretch is felt. Turn it all the way to the left so you can look over your left shoulder. Do not tilt or tip your head. Hold this position for 10-30 seconds. Repeat __________ times. Complete this exercise __________ times a day. Neck retraction  Sit in a sturdy chair or stand up. Look straight ahead. Do not bend your neck. Use your fingers to push your chin backward (retraction). Do not bend your neck for this movement. Continue to face straight ahead. If you are doing the exercise properly, you will feel a slight sensation in your throat and a stretch at the back of your neck. Hold the stretch for 1-2 seconds. Repeat __________ times. Complete this exercise __________ times a day. Strengthening exercises Neck press  Lie on your back on a firm bed or on the floor with a pillow under your head. Use your neck muscles to push your head down on the pillow and straighten your spine. Hold the position as well as you can. Keep your head  facing up (in a neutral position) and your chin tucked. Slowly count to 5 while holding this position. Repeat __________ times. Complete this exercise __________ times a day. Isometrics These are exercises in which you strengthen the muscles in your neck while keeping your neck still (isometrics). Sit in a supportive chair and place your hand on your forehead. Keep your head and face facing straight ahead. Do not flex or extend your neck while doing isometrics. Push forward with your head and neck while pushing back with your hand. Hold for 10 seconds. Do the sequence again, this time putting your hand against the back of your head. Use your head and neck to push backward against the hand pressure. Finally, do the same exercise on either side of your head, pushing sideways against the pressure of your hand. Repeat __________ times. Complete this exercise __________ times a day. Prone head lifts  Lie face-down (prone position), resting on your elbows so that your chest and upper back are raised. Start with your head facing downward, near your chest. Position your chin either on or near your chest. Slowly lift your head upward. Lift until you are looking straight ahead. Then continue lifting your head as far back as you can comfortably stretch. Hold your head up for 5 seconds. Then slowly lower it to your starting position. Repeat __________ times. Complete this exercise __________ times a day. Supine head lifts  Lie on your back (supine position), bending your knees   to point to the ceiling and keeping your feet flat on the floor. Lift your head slowly off the floor, raising your chin toward your chest. Hold for 5 seconds. Repeat __________ times. Complete this exercise __________ times a day. Scapular retraction  Stand with your arms at your sides. Look straight ahead. Slowly pull both shoulders (scapulae) backward and downward (retraction) until you feel a stretch between your shoulder  blades in your upper back. Hold for 10-30 seconds. Relax and repeat. Repeat __________ times. Complete this exercise __________ times a day. Contact a health care provider if: Your neck pain or discomfort gets worse when you do an exercise. Your neck pain or discomfort does not improve within 2 hours after you exercise. If you have any of these problems, stop exercising right away. Do not do the exercises again unless your health care provider says that you can. Get help right away if: You develop sudden, severe neck pain. If this happens, stop exercising right away. Do not do the exercises again unless your health care provider says that you can. This information is not intended to replace advice given to you by your health care provider. Make sure you discuss any questions you have with your health care provider. Document Revised: 06/12/2021 Document Reviewed: 06/12/2021 Elsevier Patient Education  2023 [employer]  

## 2022-07-23 NOTE — Addendum Note (Signed)
Addended by: Francis Gaines C on: 07/23/2022 11:54 AM   Modules accepted: Orders

## 2022-07-25 ENCOUNTER — Ambulatory Visit (INDEPENDENT_AMBULATORY_CARE_PROVIDER_SITE_OTHER): Payer: Medicare Other | Admitting: Cardiovascular Disease

## 2022-07-25 ENCOUNTER — Encounter: Payer: Self-pay | Admitting: Cardiovascular Disease

## 2022-07-25 VITALS — BP 145/70 | HR 66 | Ht 69.0 in | Wt 231.2 lb

## 2022-07-25 DIAGNOSIS — G4733 Obstructive sleep apnea (adult) (pediatric): Secondary | ICD-10-CM | POA: Diagnosis not present

## 2022-07-25 DIAGNOSIS — I87099 Postthrombotic syndrome with other complications of unspecified lower extremity: Secondary | ICD-10-CM | POA: Diagnosis not present

## 2022-07-25 DIAGNOSIS — D6869 Other thrombophilia: Secondary | ICD-10-CM

## 2022-07-25 DIAGNOSIS — I2781 Cor pulmonale (chronic): Secondary | ICD-10-CM

## 2022-07-25 DIAGNOSIS — I50812 Chronic right heart failure: Secondary | ICD-10-CM | POA: Diagnosis not present

## 2022-07-25 DIAGNOSIS — I10 Essential (primary) hypertension: Secondary | ICD-10-CM

## 2022-07-25 DIAGNOSIS — J449 Chronic obstructive pulmonary disease, unspecified: Secondary | ICD-10-CM

## 2022-07-25 DIAGNOSIS — M069 Rheumatoid arthritis, unspecified: Secondary | ICD-10-CM | POA: Diagnosis not present

## 2022-07-25 DIAGNOSIS — I5032 Chronic diastolic (congestive) heart failure: Secondary | ICD-10-CM

## 2022-07-25 MED ORDER — SPIRONOLACTONE 50 MG PO TABS
50.0000 mg | ORAL_TABLET | Freq: Every day | ORAL | 3 refills | Status: DC
Start: 2022-07-25 — End: 2023-07-14

## 2022-07-25 NOTE — Patient Instructions (Addendum)
Medication Instructions:  INCREASE the Spironolactone to 50 mg once daily  *If you need a refill on your cardiac medications before your next appointment, please call your pharmacy*   Lab Work: Your provider would like for you to return in one week to have the following labs drawn: BMET. You do not need an appointment for the lab. Once in our office lobby there is a podium where you can sign in and ring the doorbell to alert [place] that you are here. The lab is open from 8:00 am to 4 pm; closed for lunch from 12:45pm-1:45pm.  You may also go to any of these [facility] locations:   [place] ([place]) - [place] [address] [place] [address] [address] - [address] [place] - [address] ([facility]  If you have labs (blood work) drawn today and your tests are completely normal, you will receive your results only by: [employer] (if you have MyChart) OR A paper copy in the mail If you have any lab test that is abnormal or we need to change your treatment, we will call you to review the results.   Testing/Procedures: None ordered   Follow-Up: At [facility], you and your health needs are our priority.  As part of our continuing mission to provide you with exceptional heart care, we have created designated Provider Care Teams.  These Care Teams include your primary Cardiologist (physician) and Advanced Practice Providers (APPs -  Physician Assistants and Nurse Practitioners) who all work together to provide you with the care you need, when you need it.  We recommend signing up for the patient portal called "MyChart".  Sign up information is provided on this After Visit Summary.  MyChart is used to connect with patients for Virtual Visits  (Telemedicine).  Patients are able to view lab/test results, encounter notes, upcoming appointments, etc.  Non-urgent messages can be sent to your provider as well.   To learn more about what you can do with MyChart, go to [email or web address].    Your next appointment:   12 month(s)  The format for your next appointment:   In Person  Provider:   [name], MD {  Dr. Sallyanne Kuster would like you to check your blood pressure daily for the next week.  Keep a journal of these daily blood pressure and heart rate readings and call our office or send a message through [place] with the results. Thank you!  It is best to check your BP 1-2 hours after taking your medications to see the medications effectiveness on your BP.    Here are some tips that our clinical pharmacists share for home BP monitoring:          Rest 10 minutes before taking your blood pressure.          Don't smoke or drink caffeinated beverages for at least 30 minutes before.          Take your blood pressure before (not after) you eat.          Sit comfortably with your back supported and both feet on the floor (don't cross your legs).          Elevate  your arm to heart level on a table or a desk.          Use the proper sized cuff. It should fit smoothly and snugly around your bare upper arm. There should be enough room to slip a fingertip under the cuff. The bottom edge of the cuff should be 1 inch above the crease of the elbow.   Important Information About Sugar

## 2022-07-25 NOTE — Progress Notes (Signed)
Cardiology Office Note   Date:  07/27/2022   ID:  [name], DOB May 08, 1951, MRN [id number]  Patient Location: Home Provider Location: Office  PCP:  [name], MD  Cardiologist:  [name], MD  Electrophysiologist:  None   Evaluation Performed:  Follow-Up Visit  Chief Complaint:  edema  History of Present Illness:    [name] is a 71 y.o. male with postphlebitic syndrome, complicated by cellulitis of the right leg, including episode of sepsis with Serratia a couple of years ago.  He is on immunosuppressive drugs for rheumatoid arthritis (Actemra).  He continues to have bilateral lower extremity edema, almost always worse on the right side.  At this point he has a very tiny sore on the anterior right shin that he has just covered with a small Band-Aid.  He has not had any problems with fever or chills, drainage, blisters or cellulitis.  He wears elastic wraps on a daily basis.  He has mild chronic dyspnea attributable to COPD, sometimes associated with wheezing.  He reports 100% compliance with CPAP and he denies daytime hypersomnolence.  He does not have chest pain, orthopnea, PND, palpitations, dizziness, syncope, but does have cough that does not produce hemoptysis or discolored sputum.  He does his best to be compliant with sodium restriction.  His edema is partly related to right heart failure/cor pulmonale.    He was hospitalized in March 2023 with complaints of chest pain and shortness of breath, worse at night.  He underwent heart catheterization that showed no evidence of obstructive CAD.  His echo showed normal LV function and regional wall motion.  He improved with bronchodilators and steroids.   He has mild hypercholesterolemia with mildly elevated LDL but also with a very high HDL cholesterol level.  He does not have known coronary or peripheral vascular disease.  He is on chronic warfarin anticoagulation without serious bleeding complications,  monitored in [facility].  He had normal perfusion on a nuclear stress test in March 2020.  LVEF was reported at 48%, suspected to be artificially low due to diaphragmatic interference.  An echocardiogram performed in June 9 during his recent hospitalization showed a left ventricular ejection fraction of 55-60% and normal diastolic function, no significant valvular abnormalities.     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 Unknown origin.   Shortness of breath dyspnea    exertion    Sleep apnea    cpap    Tubular adenoma of  colon 04/2008   Past Surgical History:  Procedure Laterality Date   CHOLECYSTECTOMY     CIRCUMCISION     COLONOSCOPY     CYSTOSCOPY WITH LITHOLAPAXY N/A 03/17/2019   Procedure: CYSTOSCOPY WITH LITHOLAPAXY AND REMOVAL OF FOREIGN BODY;  Surgeon: [name], MD;  Location: [facility];  Service: Urology;  Laterality: N/A;   DOPPLER ECHOCARDIOGRAPHY N/A 01-21-2012   TECHNICALLY DIFFICULT. MILD CONCENTRIC LV HYPERTROPHY. LV CAVITY IS SMALL.. EF=> 55%. TRANSMITRAL SPECTRAL FLOW PATTREN IS SUGGESTIVE OF IMPAIRED LV RELAXATION. RV SYSTOLIC PRESSURE IS 62GBTD. LEFT ATRIAL SIZE IS NORMAL. AV APPEARS MILDLY SCLEROTIC.  NO SIGN VALVE DISEASE NOTED.   LEFT HEART CATH AND CORONARY ANGIOGRAPHY N/A 03/12/2022   Procedure: LEFT HEART CATH AND CORONARY ANGIOGRAPHY;  Surgeon: Sherren Mocha, MD;  Location: [facility];  Service: Cardiovascular;  Laterality: N/A;   LYMPHADENECTOMY Bilateral 08/30/2015   Procedure: PELVIC LYMPHADENECTOMY;  Surgeon: [name], MD;  Location: [facility];  Service: Urology;  Laterality: Bilateral;   NUCLEAR STRESS TEST N/A 01-21-2012   NORMAL PATTERN OF PERFUSION IN ALL REGIONS. POST STRESS LV SIZE IS NORMAL. NO EVIDENCE OF INDUCIBLE ISCHEMIA. EF 54%.   POLYPECTOMY     PROSTATE BIOPSY     ROBOT ASSISTED LAPAROSCOPIC RADICAL PROSTATECTOMY N/A 08/30/2015   Procedure: ROBOTIC ASSISTED LAPAROSCOPIC RADICAL PROSTATECTOMY;  Surgeon: [name], MD;  Location: [facility];  Service: Urology;  Laterality: N/A;   URINARY SPHINCTER IMPLANT N/A 03/20/2021   Procedure: ARTIFICIAL URINARY SPHINCTER CYSTOSCOPY;  Surgeon: Bjorn Loser, MD;  Location: [facility];  Service: Urology;  Laterality: N/A;  REQUESTING 90 MINS   US VENOUS LOWER EXT Right 03/07/11   PERSISTENT DVT IN RIGHT LOWER EXT.WITH PERSISTANT VISUALIZATION OF HYPOECHOIC THROMBUS WITHIN THE FEMORAL, PROFUNDA FEMORAL AND POPLITEAL VEINS. WHEN COMPARED TO PREVIOUS, CLOT IS NO LONGER IDENTIFIED WITH IN THE RIGHT CFV.     Current Meds  Medication Sig   acetaminophen (TYLENOL) 650 MG CR tablet Take 1,300 mg by mouth every 8 (eight) hours as needed for pain.   albuterol (PROVENTIL) (2.5 MG/3ML) 0.083% nebulizer solution Take 3 mLs (2.5 mg total) by nebulization every 6 (six) hours as needed for wheezing or shortness of breath.   alendronate (FOSAMAX) 70 MG tablet TAKE 1 TABLET BY MOUTH EVERY WEDNESDAY. TAKE WITH A FULL GLASS OF WATER ON AN EMPTY STOMACH.   allopurinol (ZYLOPRIM) 100 MG tablet TAKE 1 TABLET BY MOUTH TWICE A DAY   BREO ELLIPTA 100-25 MCG/ACT AEPB USE 1 INHALATION ORALLY EVERY DAY   dicyclomine (BENTYL) 10 MG capsule TAKE  1 CAPSULE BY MOUTH 4 TIMES DAILY BEFORE MEALS AND AT BEDTIME (Patient taking differently: Take 20 mg by mouth 2 (two) times daily.)   folic acid (FOLVITE) 1 MG tablet TAKE 2 TABLETS BY MOUTH EVERY MORNING   furosemide (LASIX) 80 MG tablet Take 1 tablet (80 mg total) by mouth daily.   INCRUSE ELLIPTA 62.5 MCG/ACT AEPB INHALE 1 PUFF BY MOUTH EVERY DAY   leflunomide (ARAVA) 20 MG tablet Take 1 tablet (20 mg total) by mouth daily.   omeprazole (PRILOSEC) 40 MG capsule Take 40 mg by mouth at bedtime.   oxybutynin (DITROPAN-XL) 10 MG 24 hr tablet Take 1 tablet (10 mg total) by mouth daily.   phenytoin (DILANTIN) 100 MG ER capsule Take 100-200 mg by mouth See admin instructions. Take one capsule in the morning and 2 capsules at bedtime   Potassium Chloride ER 20 MEQ TBCR Take 20 mEq by mouth daily.  rosuvastatin (CRESTOR) 10 MG tablet Take 10 mg by mouth daily.   Tocilizumab (ACTEMRA IV) Inject 4 mg/kg into the vein every 28 (twenty-eight) days. For Rheumatoid Arthritis. Last ordered 06/22/20 x 2 doses   warfarin (COUMADIN) 4 MG tablet Take 4 mg by mouth daily.   [DISCONTINUED] spironolactone (ALDACTONE) 25 MG tablet Take 1 tablet (25 mg total) by mouth daily.     Allergies:   Orencia [abatacept], Carbamazepine, Celecoxib, Cephalexin, Enbrel [etanercept], Humira [adalimumab], Levofloxacin, Sulfa antibiotics, and Sulfasalazine   Social History   Tobacco Use   Smoking status: Former    Packs/day: 3.00    Years: 20.00    Total pack years: 60.00    Types: Cigarettes    Quit date: 12/30/1996    Years since quitting: 25.5    Passive exposure: Never   Smokeless tobacco: Never  Vaping Use   Vaping Use: Never used  Substance Use Topics   Alcohol use: No    Alcohol/week: 0.0 standard drinks of alcohol   Drug use: No     Family Hx: The patient's family history includes Alzheimer's disease in his mother; Bladder Cancer in his sister; Breast cancer in his sister; Colon cancer in his brother; Heart  attack in his brother, brother, father, and sister; Heart murmur in his sister; Lung cancer in his brother; Skin cancer in his father; Stomach cancer in his brother and father; Stroke in his sister; Throat cancer in his brother. There is no history of Colon polyps.  ROS:   Please see the history of present illness.    All other systems reviewed and are negative.   Prior CV studies:   The following studies were reviewed today:  Cardiac catheterization 03/12/2022 Patent coronary arteries with no significant coronary obstructive disease.  Suspect noncardiac chest pain.  Medical therapy recommended.  Echocardiogram 03/13/2022  1. Left ventricular ejection fraction, by estimation, is 55 to 60%. The  left ventricle has normal function. The left ventricle has no regional  wall motion abnormalities. Left ventricular diastolic parameters were  normal.   2. Right ventricular systolic function is normal. The right ventricular  size is normal. Tricuspid regurgitation signal is inadequate for assessing  PA pressure.   3. The mitral valve is normal in structure. No evidence of mitral valve  regurgitation. No evidence of mitral stenosis.   4. The aortic valve was not well visualized. Aortic valve regurgitation  is not visualized. No aortic stenosis is present.   Labs/Other Tests and Data Reviewed:    EKG: Not ordered today.  Reviewed the tracing from 03/13/2022 which shows normal sinus rhythm and is a completely normal tracing.  Recent Labs: 03/11/2022: B Natriuretic Peptide 38.0 03/12/2022: Magnesium 2.0 07/19/2022: ALT 35; BUN 12; Creatinine, Ser 1.36; Hemoglobin 15.2; Platelets 158; Potassium 4.3; Sodium 139   Recent Lipid Panel Lab Results  Component Value Date/Time   CHOL 202 (H) 02/01/2022 11:18 AM   TRIG 156 (H) 02/01/2022 11:18 AM   HDL 64 02/01/2022 11:18 AM   CHOLHDL 3.2 02/01/2022 11:18 AM   LDLCALC 107 (H) 02/01/2022 11:18 AM    Wt Readings from Last 3 Encounters:  07/25/22  231 lb 3.2 oz (104.9 kg)  07/23/22 231 lb 3.2 oz (104.9 kg)  07/19/22 230 lb (104.3 kg)     Objective:    Vital Signs:  BP (!) 145/70   Pulse 66   Ht '5\' 9"'$  (1.753 m)   Wt 231 lb 3.2 oz (104.9 kg)   SpO2 90%  BMI 34.14 kg/m      General: Alert, oriented x3, no distress, moderately obese Head: no evidence of trauma, PERRL, EOMI, no exophtalmos or lid lag, no myxedema, no xanthelasma; normal ears, nose and oropharynx Neck: normal jugular venous pulsations and no hepatojugular reflux; brisk carotid pulses without delay and no carotid bruits Chest: clear to auscultation, no signs of consolidation by percussion or palpation, normal fremitus, symmetrical and full respiratory excursions Cardiovascular: normal position and quality of the apical impulse, regular rhythm, normal first and second heart sounds, no murmurs, rubs or gallops Abdomen: no tenderness or distention, no masses by palpation, no abnormal pulsatility or arterial bruits, normal bowel sounds, no hepatosplenomegaly Extremities: Bilateral lower extremity edema, hard pitting almost to the knees, 2-3+ Neurological: grossly nonfocal Psych: Normal mood and affect    ASSESSMENT & PLAN:    1. Chronic right-sided heart failure ([place])   2. Complicated postphlebitic syndrome   3. Rheumatoid arthritis of hip, unspecified laterality, unspecified whether rheumatoid factor present ([place])   4. Chronic obstructive pulmonary disease, unspecified COPD type ([place])   5. OSA (obstructive sleep apnea)   6. Cor pulmonale, chronic ([place])   7. Essential hypertension   8. Acquired thrombophilia ([place])       Peripheral venous insufficiency/postphlebitic syndrome: Over the years she has developed venous stasis ulcers repeatedly.  Strongly encouraged to wear elastic wrap/compression stockings all the time and to promptly report even the smallest skin breakdown.  He has postphlebitic syndrome after bilateral DVTs. Lifelong anticoagulation.  On  warfarin due to the use of antiseizure medications. RA: Good response to Actemra and Arava, unfortunately these medications increase the likelihood of infectious complications. COPD: Acute dyspnea and chest discomfort in March led to hospitalization, but work-up showed normal coronary arteries and normal left ventricular function.  Improved with bronchodilators and steroids. OSA: Reports compliance with CPAP and denies daytime hypersomnolence. RHF/chronic cor pulmonale: Evidence of elevated right heart pressures again today.  Will increase spironolactone to 50 mg daily and repeat labs in a few weeks.  Edema from right heart failure compounds his problems with peripheral venous insufficiency and worsens the edema.  Strongly recommended sodium restriction and daily weight monitoring.  He is on chronic diuretic therapy.  Should report weight gains of more than 3 pounds in 24 hours or 5 pounds in a week. HTN: Mildly elevated.  Increasing the dose of spironolactone. Anticoagulation: Monitored by Dr. Hilma Favors in [place].  No bleeding complications.   Blood pressure 145/70 on recheck.  Spironolactone dose increased.  Medication Adjustments/Labs and Tests Ordered: Current medicines are reviewed at length with the patient today.  Concerns regarding medicines are outlined above.   Tests Ordered: Orders Placed This Encounter  Procedures   Basic metabolic panel    Medication Changes: Meds ordered this encounter  Medications   spironolactone (ALDACTONE) 50 MG tablet    Sig: Take 1 tablet (50 mg total) by mouth daily.    Dispense:  90 tablet    Refill:  3   Patient Instructions  Medication Instructions:  INCREASE the Spironolactone to 50 mg once daily  *If you need a refill on your cardiac medications before your next appointment, please call your pharmacy*   Lab Work: Your provider would like for you to return in one week to have the following labs drawn: BMET. You do not need an appointment  for the lab. Once in our office lobby there is a podium where you can sign in and ring the doorbell to alert [place]  that you are here. The lab is open from 8:00 am to 4 pm; closed for lunch from 12:45pm-1:45pm.  You may also go to any of these [facility] locations:   [place] ([place]) - [place] [address] [place] [address] - [address] [place] - [address] ([facility]  If you have labs (blood work) drawn today and your tests are completely normal, you will receive your results only by: [employer] (if you have MyChart) OR A paper copy in the mail If you have any lab test that is abnormal or we need to change your treatment, we will call you to review the results.   Testing/Procedures: None ordered   Follow-Up: At [facility], you and your health needs are our priority.  As part of our continuing mission to provide you with exceptional heart care, we have created designated Provider Care Teams.  These Care Teams include your primary Cardiologist (physician) and Advanced Practice Providers (APPs -  Physician Assistants and Nurse Practitioners) who all work together to provide you with the care you need, when you need it.  We recommend signing up for the patient portal called "MyChart".  Sign up information is provided on this After Visit Summary.  MyChart is used to connect with patients for Virtual Visits (Telemedicine).  Patients are able to view lab/test results, encounter notes, upcoming appointments, etc.  Non-urgent messages can be sent to your provider as well.   To learn more about what you can do with MyChart, go to [email or web address].    Your next appointment:   12 month(s)  The format for your next appointment:    In Person  Provider:   [name], MD {  Dr. Sallyanne Kuster would like you to check your blood pressure daily for the next week.  Keep a journal of these daily blood pressure and heart rate readings and call our office or send a message through [place] with the results. Thank you!  It is best to check your BP 1-2 hours after taking your medications to see the medications effectiveness on your BP.    Here are some tips that our clinical pharmacists share for home BP monitoring:          Rest 10 minutes before taking your blood pressure.          Don't smoke or drink caffeinated beverages for at least 30 minutes before.          Take your blood pressure before (not after) you eat.          Sit comfortably with your back supported and both feet on the floor (don't cross your legs).          Elevate your arm to heart level on a table or a desk.          Use the proper sized cuff. It should fit smoothly and snugly around your bare upper arm. There should be enough room to slip a fingertip under the cuff. The bottom edge of the cuff should be 1 inch above the crease of the elbow.   Important Information About Sugar        Signed, [name], MD  07/27/2022 2:14 PM    [facility]

## 2022-07-27 ENCOUNTER — Encounter: Payer: Self-pay | Admitting: Cardiovascular Disease

## 2022-08-05 DIAGNOSIS — M542 Cervicalgia: Secondary | ICD-10-CM | POA: Diagnosis not present

## 2022-08-13 DIAGNOSIS — M542 Cervicalgia: Secondary | ICD-10-CM | POA: Diagnosis not present

## 2022-08-16 ENCOUNTER — Ambulatory Visit (HOSPITAL_COMMUNITY)
Admission: RE | Admit: 2022-08-16 | Discharge: 2022-08-16 | Disposition: A | Discharge status: Home / Self Care | Payer: Medicare Other | Source: Ambulatory Visit | Attending: Rheumatology | Admitting: Rheumatology

## 2022-08-16 DIAGNOSIS — M0579 Rheumatoid arthritis with rheumatoid factor of multiple sites without organ or systems involvement: Secondary | ICD-10-CM | POA: Diagnosis not present

## 2022-08-16 MED ORDER — TOCILIZUMAB 400 MG/20ML IV SOLN
400.0000 mg | INTRAVENOUS | Status: DC
  Administered 2022-08-16: 400 mg via INTRAVENOUS
  Filled 2022-08-16: qty 20

## 2022-08-16 MED ORDER — ACETAMINOPHEN 325 MG PO TABS: 650.0000 mg | ORAL_TABLET | ORAL | Status: DC

## 2022-08-16 MED ORDER — DIPHENHYDRAMINE HCL 25 MG PO CAPS: 25.0000 mg | ORAL_CAPSULE | ORAL | Status: DC

## 2022-08-17 ENCOUNTER — Other Ambulatory Visit: Payer: Self-pay | Admitting: Student

## 2022-08-20 DIAGNOSIS — M542 Cervicalgia: Secondary | ICD-10-CM | POA: Diagnosis not present

## 2022-08-22 DIAGNOSIS — Z7901 Long term (current) use of anticoagulants: Secondary | ICD-10-CM | POA: Diagnosis not present

## 2022-08-22 DIAGNOSIS — M542 Cervicalgia: Secondary | ICD-10-CM | POA: Diagnosis not present

## 2022-08-27 DIAGNOSIS — M542 Cervicalgia: Secondary | ICD-10-CM | POA: Diagnosis not present

## 2022-08-28 ENCOUNTER — Ambulatory Visit (INDEPENDENT_AMBULATORY_CARE_PROVIDER_SITE_OTHER): Payer: Medicare Other | Admitting: Pulmonary Disease

## 2022-08-28 ENCOUNTER — Encounter: Payer: Self-pay | Admitting: Pulmonary Disease

## 2022-08-28 ENCOUNTER — Ambulatory Visit (INDEPENDENT_AMBULATORY_CARE_PROVIDER_SITE_OTHER): Payer: Medicare Other | Admitting: Internal Medicine

## 2022-08-28 DIAGNOSIS — G4733 Obstructive sleep apnea (adult) (pediatric): Secondary | ICD-10-CM

## 2022-08-28 DIAGNOSIS — R9389 Abnormal findings on diagnostic imaging of other specified body structures: Secondary | ICD-10-CM

## 2022-08-28 DIAGNOSIS — J441 Chronic obstructive pulmonary disease with (acute) exacerbation: Secondary | ICD-10-CM | POA: Diagnosis not present

## 2022-08-28 DIAGNOSIS — Z8709 Personal history of other diseases of the respiratory system: Secondary | ICD-10-CM | POA: Diagnosis not present

## 2022-08-28 DIAGNOSIS — J986 Disorders of diaphragm: Secondary | ICD-10-CM | POA: Insufficient documentation

## 2022-08-28 LAB — PULMONARY FUNCTION TEST
DL/VA % pred: 100 %
DL/VA: 4.09 ml/min/mmHg/L
DLCO cor % pred: 62 %
DLCO cor: 15.31 ml/min/mmHg
DLCO unc % pred: 63 %
DLCO unc: 15.56 ml/min/mmHg
FEF 25-75 Post: 2.57 L/sec
FEF 25-75 Pre: 1.58 L/sec
FEF2575-%Change-Post: 63 %
FEF2575-%Pred-Post: 112 %
FEF2575-%Pred-Pre: 68 %
FEV1-%Change-Post: 8 %
FEV1-%Pred-Post: 57 %
FEV1-%Pred-Pre: 52 %
FEV1-Post: 1.73 L
FEV1-Pre: 1.59 L
FEV1FVC-%Change-Post: 2 %
FEV1FVC-%Pred-Pre: 111 %
FEV6-%Change-Post: 5 %
FEV6-%Pred-Post: 52 %
FEV6-%Pred-Pre: 50 %
FEV6-Post: 2.06 L
FEV6-Pre: 1.95 L
FEV6FVC-%Pred-Post: 106 %
FEV6FVC-%Pred-Pre: 106 %
FVC-%Change-Post: 5 %
FVC-%Pred-Post: 49 %
FVC-%Pred-Pre: 47 %
FVC-Post: 2.06 L
FVC-Pre: 1.95 L
Post FEV1/FVC ratio: 84 %
Post FEV6/FVC ratio: 100 %
Pre FEV1/FVC ratio: 82 %
Pre FEV6/FVC Ratio: 100 %
RV % pred: 84 %
RV: 2.01 L
TLC % pred: 62 %
TLC: 4.21 L

## 2022-08-28 MED ORDER — TRELEGY ELLIPTA 100-62.5-25 MCG/ACT IN AEPB: 1.0000 | INHALATION_SPRAY | Freq: Every day | RESPIRATORY_TRACT | 0 refills | Status: DC

## 2022-08-28 NOTE — Progress Notes (Signed)
Full PFT Performed Today  

## 2022-08-28 NOTE — Assessment & Plan Note (Signed)
Not convinced about COPD but he has had significant benefit from triple therapy so we will continue. We will give him a sample of Trelegy to use instead of Breo and Incruse to help him with this lower co-pay

## 2022-08-28 NOTE — Assessment & Plan Note (Signed)
Bland scarring bibasal, no definite ILD or fibrosis

## 2022-08-28 NOTE — Assessment & Plan Note (Addendum)
He has left hemidiaphragm weakness which explains the symptoms of fullness/dyspnea after eating and worsening dyspnea when supine BiPAP should help with this

## 2022-08-28 NOTE — Assessment & Plan Note (Signed)
BiPAP download was reviewed which shows better control of events with residual AHI of 9/hour on auto settings with a EPAP of 10 and an average pressure of 17/13 cm. I will increase his EPAP minimum to 12 cm and hopefully should lower his AHI even more  Weight loss encouraged, compliance with goal of at least 4-6 hrs every night is the expectation. Advised against medications with sedative side effects Cautioned against driving when sleepy - understanding that sleepiness will vary on a day to day basis

## 2022-08-28 NOTE — Progress Notes (Signed)
   Subjective:    Patient ID: [name], male    DOB: [birth year], 71 y.o.   MRN: [id number]  HPI  71 yo remote smoker for FU of COPD and severe obstructive sleep apnea -On BiPAP  PMH: GERD,  chronic anticoagulation for DVT,  rheumatoid arthritis (on Actemra),CHF,  prostate cancer,  Serratia sepsis (June/2020) Covid pneumonia 2020 penile implant   Last seen by me 02/2021 Reviewed office visit with Dr. Chase [name] 04/2022 Reviewed HRCT which only showed bland scarring without definite fibrosis. He had persistent events on CPAP titration and so was prescribed auto BiPAP He reports sleeping much better since we got him a BiPAP machine.  He sometimes sleep in a recliner.  He reports increased dyspnea after eating on a full stomach  He is on Breo and Incruse and co-pays are very high he is being up to $600 for a 90-day supply.  Also reports hoarseness of voice and is unable to sing  Significant tests/ events reviewed  HRCT 05/2022 >> [name] appearing dependent bibasilar scarring or atelectasis, more in the left lung, with elevation of the left hemidiaphragm, unchanged   CT chest 11/2018   Showed elevated Lt hemi-diaphragm  emphysema, mild bronchiectasis and peripheral subpleural reticulation within right lung base which appears slightlyprogressive.   PFTs 07/2022 -no obstruction, FVC 49%, TLC 62%, DLCO 15.5/63%   PFTs 02/08/2019 - FVC 2.24 (51%), FEV1 1.90 (59%), ratio 85, DLCOunc 71   PFTs 11/26/2017 - FVC 2.11 (48%), FEV1 1.77 (54%), ratio 84, DLCOcor 63   04/26/2019-Home sleep study- AHI 38.5 an hour, SaO2 low 68%, severe O2 drops, average O2 89%   06/03/2019-CPAP titration- optimal Pap pressure could not be selected, recommendations: Trial of auto BiPAP 6 to 18 cm with medium full facemask     Review of Systems neg for any significant sore throat, dysphagia, itching, sneezing, nasal congestion or excess/ purulent secretions, fever, chills, sweats, unintended wt loss, pleuritic  or exertional cp, hempoptysis, orthopnea pnd or change in chronic leg swelling. Also denies presyncope, palpitations, heartburn, abdominal pain, nausea, vomiting, diarrhea or change in bowel or urinary habits, dysuria,hematuria, rash, arthralgias, visual complaints, headache, numbness weakness or ataxia.     Objective:   Physical Exam   Gen. Pleasant, obese, in no distress ENT - no lesions, no post nasal drip Neck: No JVD, no thyromegaly, no carotid bruits Lungs: no use of accessory muscles, no dullness to percussion, decreased on LT without rales or rhonchi  Cardiovascular: Rhythm regular, heart sounds  normal, no murmurs or gallops, 2+ peripheral edema Musculoskeletal: No deformities, no cyanosis or clubbing , no tremors        Assessment & Plan:

## 2022-08-28 NOTE — Patient Instructions (Signed)
x increase EPAP to 12 cm  BiPAP is working better  X sample of Trelegy 100 -once daily. This will take the place of Breo and Incruse, call us for prescription of this works.

## 2022-08-28 NOTE — Patient Instructions (Signed)
Full PFT Performed Today  

## 2022-08-28 NOTE — Addendum Note (Signed)
Addended by: Gavin Potters R on: 08/28/2022 03:21 PM   Modules accepted: Orders

## 2022-08-29 DIAGNOSIS — M542 Cervicalgia: Secondary | ICD-10-CM | POA: Diagnosis not present

## 2022-09-03 DIAGNOSIS — M542 Cervicalgia: Secondary | ICD-10-CM | POA: Diagnosis not present

## 2022-09-04 ENCOUNTER — Telehealth: Payer: Self-pay | Admitting: *Deleted

## 2022-09-04 ENCOUNTER — Encounter: Payer: Self-pay | Admitting: *Deleted

## 2022-09-04 NOTE — Patient Outreach (Signed)
  Care Coordination   Initial Visit Note   09/04/2022 Name: [name] MRN: [id number] DOB: February 01, 1951  [name] is a 71 y.o. year old male who sees [name], MD for primary care. I spoke with  [name] by phone today.  What matters to the patients health and wellness today?  COPD management and cost of inhalers    Goals Addressed             This Visit's Progress    Care Coordination Services       Care Coordination Interventions: Reviewed medications with patient and discussed affordability. Patient was recently switched to Trelegy by pulmonologist and given a sample. May need Rx assistance if he has to purchase it. If stops Trelegy and resumes Breo and Incruse he would like assistance as well.  Assessed social determinant of health barriers Assessed mobility and ability to perform ADLs Assessed family/Social support Provided patient/caregiver with verbal information on [place] [phone]) Follow-up telephone appt scheduled with Valente David, RN Care Coordinator [phone]) for 08/03/22 at 10:00 Discussed switch from Incruse Ellipta and Breo to Trelegy. He's been using it for a week and so far feels that the Trelegy doesn't work as well. Encouraged patient to follow-up with pulmonologist regarding medication effectiveness, especially if he has any new or worsening symptoms        SDOH assessments and interventions completed:  Yes  SDOH Interventions Today    Flowsheet Row Most Recent Value  SDOH Interventions   Food Insecurity Interventions Intervention Not Indicated  Housing Interventions Intervention Not Indicated  Transportation Interventions Intervention Not Indicated  Utilities Interventions Intervention Not Indicated        Care Coordination Interventions Activated:  Yes  Care Coordination Interventions:  Yes, provided   Follow up plan: Follow up call scheduled for 10/03/22 at 10:00 with Valente David, RN Care  Coordinator [phone])   Encounter Outcome:  Pt. Visit Completed   Chong Sicilian, BSN, RN-BC [place] / [place] Direct Dial: [phone]

## 2022-09-05 DIAGNOSIS — M542 Cervicalgia: Secondary | ICD-10-CM | POA: Diagnosis not present

## 2022-09-10 DIAGNOSIS — Z6834 Body mass index (BMI) 34.0-34.9, adult: Secondary | ICD-10-CM | POA: Diagnosis not present

## 2022-09-10 DIAGNOSIS — M542 Cervicalgia: Secondary | ICD-10-CM | POA: Diagnosis not present

## 2022-09-10 DIAGNOSIS — M5030 Other cervical disc degeneration, unspecified cervical region: Secondary | ICD-10-CM | POA: Diagnosis not present

## 2022-09-10 DIAGNOSIS — E6609 Other obesity due to excess calories: Secondary | ICD-10-CM | POA: Diagnosis not present

## 2022-09-13 ENCOUNTER — Ambulatory Visit (HOSPITAL_COMMUNITY)
Admission: RE | Admit: 2022-09-13 | Discharge: 2022-09-13 | Disposition: A | Discharge status: Home / Self Care | Payer: Medicare Other | Source: Ambulatory Visit | Attending: Rheumatology | Admitting: Rheumatology

## 2022-09-13 DIAGNOSIS — M0579 Rheumatoid arthritis with rheumatoid factor of multiple sites without organ or systems involvement: Secondary | ICD-10-CM | POA: Diagnosis not present

## 2022-09-13 DIAGNOSIS — Z79899 Other long term (current) drug therapy: Secondary | ICD-10-CM | POA: Diagnosis not present

## 2022-09-13 LAB — CBC WITH DIFFERENTIAL/PLATELET
Abs Immature Granulocytes: 0.04 10*3/uL (ref 0.00–0.07)
Basophils Absolute: 0 10*3/uL (ref 0.0–0.1)
Basophils Relative: 0 %
Eosinophils Absolute: 0 10*3/uL (ref 0.0–0.5)
Eosinophils Relative: 0 %
HCT: 42.5 % (ref 39.0–52.0)
Hemoglobin: 14.2 g/dL (ref 13.0–17.0)
Immature Granulocytes: 1 %
Lymphocytes Relative: 7 %
Lymphs Abs: 0.6 10*3/uL — ABNORMAL LOW (ref 0.7–4.0)
MCH: 30.7 pg (ref 26.0–34.0)
MCHC: 33.4 g/dL (ref 30.0–36.0)
MCV: 92 fL (ref 80.0–100.0)
Monocytes Absolute: 0.4 10*3/uL (ref 0.1–1.0)
Monocytes Relative: 5 %
Neutro Abs: 7.3 10*3/uL (ref 1.7–7.7)
Neutrophils Relative %: 87 %
Platelets: 170 10*3/uL (ref 150–400)
RBC: 4.62 MIL/uL (ref 4.22–5.81)
RDW: 14 % (ref 11.5–15.5)
WBC: 8.4 10*3/uL (ref 4.0–10.5)
nRBC: 0 % (ref 0.0–0.2)

## 2022-09-13 LAB — COMPREHENSIVE METABOLIC PANEL
ALT: 42 U/L (ref 0–44)
AST: 45 U/L — ABNORMAL HIGH (ref 15–41)
Albumin: 3.9 g/dL (ref 3.5–5.0)
Alkaline Phosphatase: 58 U/L (ref 38–126)
Anion gap: 8 (ref 5–15)
BUN: 17 mg/dL (ref 8–23)
CO2: 30 mmol/L (ref 22–32)
Calcium: 8.9 mg/dL (ref 8.9–10.3)
Chloride: 101 mmol/L (ref 98–111)
Creatinine, Ser: 1.43 mg/dL — ABNORMAL HIGH (ref 0.61–1.24)
GFR, Estimated: 52 mL/min — ABNORMAL LOW (ref >60)
Glucose, Bld: 157 mg/dL — ABNORMAL HIGH (ref 70–99)
Potassium: 4.8 mmol/L (ref 3.5–5.1)
Sodium: 139 mmol/L (ref 135–145)
Total Bilirubin: 0.6 mg/dL (ref 0.3–1.2)
Total Protein: 6.2 g/dL — ABNORMAL LOW (ref 6.5–8.1)

## 2022-09-13 MED ORDER — TOCILIZUMAB 400 MG/20ML IV SOLN
400.0000 mg | INTRAVENOUS | Status: DC
  Administered 2022-09-13: 400 mg via INTRAVENOUS
  Filled 2022-09-13: qty 20

## 2022-09-13 MED ORDER — DIPHENHYDRAMINE HCL 25 MG PO CAPS: 25.0000 mg | ORAL_CAPSULE | ORAL | Status: DC

## 2022-09-13 MED ORDER — ACETAMINOPHEN 325 MG PO TABS: 650.0000 mg | ORAL_TABLET | ORAL | Status: DC

## 2022-09-13 NOTE — Progress Notes (Signed)
Glucose is elevated-157.  Creatinine is elevated-1.43-continues to trend up.   GFR is low at 52.  Please advise the patient to avoid the use of NSAIDs.   He is taking lasix 80 mg daily.  Please forward results to nephrologist.  AST borderline elevated but improving.  ALT WNL.   Absolute lymphocyte count is borderline low.  Rest of CBC WNL.  We will continue to monitor.

## 2022-09-16 NOTE — Progress Notes (Signed)
Please forward results to PCP and him and his PCP can determine if he should see a nephrologist in the future.  His creatinine continues to trend up but it could be due to diuretic use.

## 2022-09-30 DIAGNOSIS — I872 Venous insufficiency (chronic) (peripheral): Secondary | ICD-10-CM | POA: Diagnosis not present

## 2022-09-30 DIAGNOSIS — X32XXXA Exposure to sunlight, initial encounter: Secondary | ICD-10-CM | POA: Diagnosis not present

## 2022-09-30 DIAGNOSIS — L570 Actinic keratosis: Secondary | ICD-10-CM | POA: Diagnosis not present

## 2022-10-03 ENCOUNTER — Ambulatory Visit: Payer: Self-pay | Admitting: *Deleted

## 2022-10-03 NOTE — Patient Outreach (Signed)
  Care Coordination   10/03/2022 Name: [name] MRN: [id number] DOB: [birth year]   Care Coordination Outreach Attempts:  An unsuccessful telephone outreach was attempted for a scheduled appointment today.  Follow Up Plan:  Additional outreach attempts will be made to offer the patient care coordination information and services.   Encounter Outcome:  No Answer. Left HIPAA compliant voicemail with rescheduled appt information and my telephone number to return call if appointment date and time does not work for him.  Care Coordination Interventions Activated:  No   Care Coordination Interventions:  No, not indicated    Chong Sicilian, BSN, RN-BC RN Care Coordinator [place]: [phone] Main #: [phone]

## 2022-10-09 ENCOUNTER — Ambulatory Visit: Payer: Self-pay | Admitting: *Deleted

## 2022-10-09 NOTE — Patient Outreach (Signed)
  Care Coordination   Follow Up Visit Note   10/09/2022 Name: [name] MRN: [id number] DOB: 1951/04/19  [name] is a 71 y.o. year old male who sees [name], MD for primary care. I spoke with  [name] by phone today.  What matters to the patients health and wellness today?  Report Trellegy did not work for him, switched back to [employer] but continues to have issues with cost.  Denies any current shortness of breath.  Denies any urgent concerns, encouraged to contact this care manager with questions.      Goals Addressed             This Visit's Progress    Care Coordination Services   On track    Care Coordination Interventions: Provided patient with basic written and verbal COPD education on self care/management/and exacerbation prevention Advised patient to track and manage COPD triggers Provided instruction about proper use of medications used for management of COPD including inhalers Advised patient to self assesses COPD action plan zone and make appointment with provider if in the yellow zone for 48 hours without improvement Referral placed to pharmacy team for assistance with cost of Incruse and Breo Discussed getting covid and flu vaccinations Discussed continued use of Bipap Discussed using proper hygiene precautions and infection prevention to decrease risk of covid/flu         SDOH assessments and interventions completed:  No     Care Coordination Interventions Activated:  Yes  Care Coordination Interventions:  Yes, provided   Follow up plan: Follow up call scheduled for 11/9    Encounter Outcome:  Pt. Visit Completed   Valente David, RN,MSN, [place] Management Care Management Coordinator [phone]

## 2022-10-09 NOTE — Patient Instructions (Signed)
Visit Information  Thank you for taking time to visit with me today. Please don't hesitate to contact me if I can be of assistance to you before our next scheduled telephone appointment.  Following are the goals we discussed today:  (Copy and paste patient goals from clinical care plan here)  Our next appointment is by telephone on 11/9  Please call the care guide team at [phone] if you need to cancel or reschedule your appointment.   Please call the Suicide and Crisis Lifeline: 988 call the [place] National Suicide Prevention Lifeline: 8120671736 or TTY: 309 735 7254 TTY 239-712-2581) to talk to a trained counselor call 1-800-273-TALK (toll free, 24 hour hotline) call the [facility]: [phone] call 911 if you are experiencing a Mental Health or [place] or need someone to talk to.  Patient verbalizes understanding of instructions and care plan provided today and agrees to view in [place]. Active MyChart status and patient understanding of how to access instructions and care plan via MyChart confirmed with patient.     The patient has been provided with contact information for the care management team and has been advised to call with any health related questions or concerns.   Valente David, RN, MSN, [place] Care Management Care Management Coordinator [phone]

## 2022-10-11 ENCOUNTER — Ambulatory Visit (HOSPITAL_COMMUNITY)
Admission: RE | Admit: 2022-10-11 | Discharge: 2022-10-11 | Disposition: A | Discharge status: Home / Self Care | Payer: Medicare Other | Source: Ambulatory Visit | Attending: Rheumatology | Admitting: Rheumatology

## 2022-10-11 DIAGNOSIS — M0579 Rheumatoid arthritis with rheumatoid factor of multiple sites without organ or systems involvement: Secondary | ICD-10-CM | POA: Diagnosis not present

## 2022-10-11 MED ORDER — TOCILIZUMAB 400 MG/20ML IV SOLN
400.0000 mg | INTRAVENOUS | Status: DC
  Administered 2022-10-11: 400 mg via INTRAVENOUS
  Filled 2022-10-11: qty 20

## 2022-10-11 MED ORDER — ACETAMINOPHEN 325 MG PO TABS: 650.0000 mg | ORAL_TABLET | ORAL | Status: DC

## 2022-10-11 MED ORDER — DIPHENHYDRAMINE HCL 25 MG PO CAPS: 25.0000 mg | ORAL_CAPSULE | ORAL | Status: DC

## 2022-10-15 DIAGNOSIS — Z23 Encounter for immunization: Secondary | ICD-10-CM | POA: Diagnosis not present

## 2022-10-19 ENCOUNTER — Other Ambulatory Visit: Payer: Self-pay | Admitting: Physician Assistant

## 2022-10-19 ENCOUNTER — Other Ambulatory Visit: Payer: Self-pay | Admitting: Rheumatology

## 2022-10-21 NOTE — Telephone Encounter (Signed)
Next Visit: 01/09/2023  Last Visit: 07/23/2022  Last Fill: 06/24/2022  DX: Rheumatoid arthritis involving multiple sites with positive rheumatoid factor   Current Dose per office note 07/23/2022: Arava 20 mg 1 tablet by mouth daily  Labs: 09/13/2022 Glucose is elevated-157.  Creatinine is elevated-1.43-continues to trend up.    GFR is low at 52. AST borderline elevated but improving.  ALT WNL.    Absolute lymphocyte count is borderline low.  Rest of CBC WNL.  Okay to refill Arava?

## 2022-10-21 NOTE — Telephone Encounter (Signed)
Next Visit: 01/09/2023   Last Visit: 07/23/2022   Last Fill: 06/11/2022  DX: Chronic idiopathic gout involving toe without tophus Age-related osteoporosis without current pathological fracture    Current Dose per office note 07/23/2022: allopurinol 100 mg 2 tablets daily for management of gout. fosamax 70 mg 1 tablet by mouth once weekly for management of osteoporosis.   Labs: 09/13/2022 Glucose is elevated-157.  Creatinine is elevated-1.43-continues to trend up.    GFR is low at 52. AST borderline elevated but improving.  ALT WNL.    Absolute lymphocyte count is borderline low.  Rest of CBC WNL.  Okay to refill Allopurinol and Fosamax?

## 2022-11-05 ENCOUNTER — Telehealth: Payer: Self-pay | Admitting: *Deleted

## 2022-11-05 DIAGNOSIS — Z7901 Long term (current) use of anticoagulants: Secondary | ICD-10-CM | POA: Diagnosis not present

## 2022-11-05 NOTE — Progress Notes (Signed)
  Care Coordination Note  11/05/2022 Name: [name] MRN: [id number] DOB: 1951-03-18  [name] is a 71 y.o. year old male who is a primary care patient of [name], MD and is actively engaged with the care management team. I reached out to Corlis Hove by phone today to assist with re-scheduling a follow up visit with the RN Case Manager  Follow up plan: Unsuccessful telephone outreach attempt made. A HIPAA compliant phone message was left for the patient providing contact information and requesting a return call.   [place]  Direct Dial: [phone]

## 2022-11-07 ENCOUNTER — Encounter: Payer: Self-pay | Admitting: *Deleted

## 2022-11-07 ENCOUNTER — Other Ambulatory Visit: Payer: Self-pay | Admitting: Pharmacist

## 2022-11-07 DIAGNOSIS — M0579 Rheumatoid arthritis with rheumatoid factor of multiple sites without organ or systems involvement: Secondary | ICD-10-CM

## 2022-11-07 DIAGNOSIS — Z79899 Other long term (current) drug therapy: Secondary | ICD-10-CM

## 2022-11-07 NOTE — Progress Notes (Signed)
Next infusion scheduled for Actemra IV on 11/08/22 and due for updated orders. He also takes leflunomide '20mg'$  once daily  Diagnosis: RA  Dose: '4mg'$ /kg every 4 weeks ('416mg'$  based on last recorded weight of 104kg)  Last Clinic Visit: 07/23/22 Next Clinic Visit: 01/09/2023  Last infusion: 10/11/2022  Labs: 09/13/2022 CBC and CMP  TB Gold: negative on 02/01/2022   Orders placed for Actemra IV x 3 doses along with premedication of acetaminophen and diphenhydramine to be administered 30 minutes before medication infusion.  Standing CBC with diff/platelet and CMP with GFR orders placed to be drawn every 2 months.  Next TB gold due 02/01/2023. Lipid panel ldue in Feb 2024.  Knox Saliva, PharmD, MPH, BCPS, CPP Clinical Pharmacist (Rheumatology and Pulmonology)

## 2022-11-08 ENCOUNTER — Ambulatory Visit (HOSPITAL_COMMUNITY)
Admission: RE | Admit: 2022-11-08 | Discharge: 2022-11-08 | Disposition: A | Discharge status: Home / Self Care | Payer: Medicare Other | Source: Ambulatory Visit | Attending: Rheumatology | Admitting: Rheumatology

## 2022-11-08 VITALS — BP 150/74 | HR 74 | Temp 97.7°F | Resp 16 | Wt 230.0 lb

## 2022-11-08 DIAGNOSIS — M0579 Rheumatoid arthritis with rheumatoid factor of multiple sites without organ or systems involvement: Secondary | ICD-10-CM | POA: Diagnosis not present

## 2022-11-08 DIAGNOSIS — M170 Bilateral primary osteoarthritis of knee: Secondary | ICD-10-CM | POA: Insufficient documentation

## 2022-11-08 DIAGNOSIS — Z79899 Other long term (current) drug therapy: Secondary | ICD-10-CM | POA: Diagnosis not present

## 2022-11-08 LAB — CBC WITH DIFFERENTIAL/PLATELET
Abs Immature Granulocytes: 0.11 10*3/uL — ABNORMAL HIGH (ref 0.00–0.07)
Basophils Absolute: 0.1 10*3/uL (ref 0.0–0.1)
Basophils Relative: 1 %
Eosinophils Absolute: 0.1 10*3/uL (ref 0.0–0.5)
Eosinophils Relative: 1 %
HCT: 44 % (ref 39.0–52.0)
Hemoglobin: 14.9 g/dL (ref 13.0–17.0)
Immature Granulocytes: 1 %
Lymphocytes Relative: 9 %
Lymphs Abs: 1.3 10*3/uL (ref 0.7–4.0)
MCH: 31.4 pg (ref 26.0–34.0)
MCHC: 33.9 g/dL (ref 30.0–36.0)
MCV: 92.6 fL (ref 80.0–100.0)
Monocytes Absolute: 1.4 10*3/uL — ABNORMAL HIGH (ref 0.1–1.0)
Monocytes Relative: 9 %
Neutro Abs: 11.9 10*3/uL — ABNORMAL HIGH (ref 1.7–7.7)
Neutrophils Relative %: 79 %
Platelets: 198 10*3/uL (ref 150–400)
RBC: 4.75 MIL/uL (ref 4.22–5.81)
RDW: 15 % (ref 11.5–15.5)
WBC: 15 10*3/uL — ABNORMAL HIGH (ref 4.0–10.5)
nRBC: 0 % (ref 0.0–0.2)

## 2022-11-08 MED ORDER — TOCILIZUMAB 400 MG/20ML IV SOLN
4.0000 mg/kg | INTRAVENOUS | Status: DC
  Filled 2022-11-08: qty 20.9

## 2022-11-08 MED ORDER — TOCILIZUMAB 400 MG/20ML IV SOLN: 4.0000 mg/kg | INTRAVENOUS | Status: DC

## 2022-11-08 MED ORDER — DIPHENHYDRAMINE HCL 25 MG PO CAPS: 25.0000 mg | ORAL_CAPSULE | ORAL | Status: DC

## 2022-11-08 MED ORDER — ACETAMINOPHEN 325 MG PO TABS: 650.0000 mg | ORAL_TABLET | ORAL | Status: DC

## 2022-11-08 MED ORDER — TOCILIZUMAB 400 MG/20ML IV SOLN
400.0000 mg | INTRAVENOUS | Status: DC
  Administered 2022-11-08: 400 mg via INTRAVENOUS
  Filled 2022-11-08: qty 20

## 2022-11-08 NOTE — Progress Notes (Signed)
White cell count is elevated.  Please asked patient if he had a recent infection, prednisone use or cortisone injection.

## 2022-11-11 NOTE — Progress Notes (Signed)
  Care Coordination Note  11/11/2022 Name: [name] MRN: [id number] DOB: Mar 03, 1951  [name] is a 71 y.o. year old male who is a primary care patient of [name], MD and is actively engaged with the care management team. I reached out to Corlis Hove by phone today to assist with re-scheduling a follow up visit with the RN Case Manager  Follow up plan: Telephone appointment with care management team member scheduled for:11/15/22  [place]  Direct Dial: [phone]

## 2022-11-15 ENCOUNTER — Encounter: Payer: Self-pay | Admitting: *Deleted

## 2022-11-15 ENCOUNTER — Ambulatory Visit: Payer: Self-pay | Admitting: *Deleted

## 2022-11-15 NOTE — Patient Outreach (Signed)
  Care Coordination   Follow Up Visit Note   11/15/2022 Name: [name] MRN: [id number] DOB: 04-Apr-1951  [name] is a 71 y.o. year old male who sees [name], MD for primary care. I  spoke with patient's wife by telephone today.  What matters to the patients health and wellness today?  Managing COPD    Goals Addressed             This Visit's Progress    Care Coordination Services       Care Coordination Interventions: Spoke with patient's wife, as patient was unavailable Discussed inhalers and management of COPD. Doesn't feel inhalers are helping Chart reviewed including relevant office notes and reports Scheduled call with patient for 11/18/22 at 2:00. Wife wrote it down on their calendar and will let him know Provided with [facility] telephone number and encouraged to reach out as needed        SDOH assessments and interventions completed:  No     Care Coordination Interventions Activated:  Yes  Care Coordination Interventions:  Yes, provided   Follow up plan: Follow up call scheduled for 11/18/22    Encounter Outcome:  Pt. Visit Completed   Chong Sicilian, BSN, RN-BC RN Care Coordinator [place]: [phone] Main #: [phone]

## 2022-11-18 ENCOUNTER — Ambulatory Visit: Payer: Self-pay | Admitting: *Deleted

## 2022-11-18 NOTE — Patient Outreach (Signed)
  Care Coordination   11/18/2022 Name: [name] MRN: [id number] DOB: 15-Oct-1951   Care Coordination Outreach Attempts:  An unsuccessful telephone outreach was attempted for a scheduled appointment today. Unsuccessful f/u #2  Follow Up Plan:  Additional outreach attempts will be made to offer the patient care coordination information and services.   Encounter Outcome:  No Answer  Care Coordination Interventions Activated:  No   Care Coordination Interventions:  No, not indicated    Chong Sicilian, BSN, RN-BC RN Care Coordinator [place]: [phone] Main #: [phone]

## 2022-11-26 DIAGNOSIS — Z7901 Long term (current) use of anticoagulants: Secondary | ICD-10-CM | POA: Diagnosis not present

## 2022-11-28 DIAGNOSIS — I50812 Chronic right heart failure: Secondary | ICD-10-CM | POA: Diagnosis not present

## 2022-11-28 DIAGNOSIS — K219 Gastro-esophageal reflux disease without esophagitis: Secondary | ICD-10-CM | POA: Diagnosis not present

## 2022-11-28 DIAGNOSIS — J449 Chronic obstructive pulmonary disease, unspecified: Secondary | ICD-10-CM | POA: Diagnosis not present

## 2022-12-04 ENCOUNTER — Telehealth: Payer: Self-pay | Admitting: *Deleted

## 2022-12-04 NOTE — Progress Notes (Signed)
  Care Coordination Note  12/04/2022 Name: [name] MRN: [id number] DOB: 07-Mar-1951  [name] is a 71 y.o. year old male who is a primary care patient of [name], MD and is actively engaged with the care management team. I reached out to Corlis Hove by phone today to assist with re-scheduling a follow up visit with the RN Case Manager  Follow up plan: Unsuccessful telephone outreach attempt made. A HIPAA compliant phone message was left for the patient providing contact information and requesting a return call.   [place]  Direct Dial: [phone]

## 2022-12-06 ENCOUNTER — Ambulatory Visit (HOSPITAL_COMMUNITY)
Admission: RE | Admit: 2022-12-06 | Discharge: 2022-12-06 | Disposition: A | Discharge status: Home / Self Care | Payer: Medicare Other | Source: Ambulatory Visit | Attending: Rheumatology | Admitting: Rheumatology

## 2022-12-06 DIAGNOSIS — M0579 Rheumatoid arthritis with rheumatoid factor of multiple sites without organ or systems involvement: Secondary | ICD-10-CM | POA: Diagnosis not present

## 2022-12-06 DIAGNOSIS — Z79899 Other long term (current) drug therapy: Secondary | ICD-10-CM | POA: Diagnosis not present

## 2022-12-06 MED ORDER — DIPHENHYDRAMINE HCL 25 MG PO CAPS: 25.0000 mg | ORAL_CAPSULE | ORAL | Status: DC

## 2022-12-06 MED ORDER — TOCILIZUMAB 400 MG/20ML IV SOLN
400.0000 mg | INTRAVENOUS | Status: DC
  Administered 2022-12-06: 400 mg via INTRAVENOUS
  Filled 2022-12-06: qty 20

## 2022-12-06 MED ORDER — ACETAMINOPHEN 325 MG PO TABS: 650.0000 mg | ORAL_TABLET | ORAL | Status: DC

## 2022-12-11 NOTE — Progress Notes (Signed)
  Care Coordination Note  12/11/2022 Name: [name] MRN: [id number] DOB: October 18, 1951  [name] is a 71 y.o. year old male who is a primary care patient of [name], MD and is actively engaged with the care management team. I reached out to Corlis Hove by phone today to assist with re-scheduling a follow up visit with the RN Case Manager  Follow up plan: Telephone appointment with care management team member scheduled for:12/16/22  [place]  Direct Dial: [phone]

## 2022-12-13 DIAGNOSIS — E6609 Other obesity due to excess calories: Secondary | ICD-10-CM | POA: Diagnosis not present

## 2022-12-13 DIAGNOSIS — I50812 Chronic right heart failure: Secondary | ICD-10-CM | POA: Diagnosis not present

## 2022-12-13 DIAGNOSIS — K219 Gastro-esophageal reflux disease without esophagitis: Secondary | ICD-10-CM | POA: Diagnosis not present

## 2022-12-13 DIAGNOSIS — Z0001 Encounter for general adult medical examination with abnormal findings: Secondary | ICD-10-CM | POA: Diagnosis not present

## 2022-12-13 DIAGNOSIS — Z1331 Encounter for screening for depression: Secondary | ICD-10-CM | POA: Diagnosis not present

## 2022-12-13 DIAGNOSIS — E782 Mixed hyperlipidemia: Secondary | ICD-10-CM | POA: Diagnosis not present

## 2022-12-13 DIAGNOSIS — G40309 Generalized idiopathic epilepsy and epileptic syndromes, not intractable, without status epilepticus: Secondary | ICD-10-CM | POA: Diagnosis not present

## 2022-12-13 DIAGNOSIS — J449 Chronic obstructive pulmonary disease, unspecified: Secondary | ICD-10-CM | POA: Diagnosis not present

## 2022-12-13 DIAGNOSIS — C61 Malignant neoplasm of prostate: Secondary | ICD-10-CM | POA: Diagnosis not present

## 2022-12-13 DIAGNOSIS — M1991 Primary osteoarthritis, unspecified site: Secondary | ICD-10-CM | POA: Diagnosis not present

## 2022-12-13 DIAGNOSIS — Z6834 Body mass index (BMI) 34.0-34.9, adult: Secondary | ICD-10-CM | POA: Diagnosis not present

## 2022-12-13 DIAGNOSIS — M069 Rheumatoid arthritis, unspecified: Secondary | ICD-10-CM | POA: Diagnosis not present

## 2022-12-13 DIAGNOSIS — Z125 Encounter for screening for malignant neoplasm of prostate: Secondary | ICD-10-CM | POA: Diagnosis not present

## 2022-12-16 ENCOUNTER — Ambulatory Visit: Payer: Self-pay | Admitting: *Deleted

## 2022-12-16 ENCOUNTER — Encounter: Payer: Self-pay | Admitting: *Deleted

## 2022-12-16 NOTE — Patient Outreach (Signed)
  Care Coordination   Follow Up Visit Note   12/16/2022 Name: [name] MRN: [id number] DOB: [birth year]  [name] is a 71 y.o. year old male who sees [name], MD for primary care. I spoke with  Corlis Hove by phone today.  What matters to the patients health and wellness today?  Getting assistance for inhalers    Goals Addressed             This Visit's Progress    COMPLETED: Care Coordination Services       Care Coordination Interventions: Reviewed and discussed medications. Switched from Trelegy to [place]. They are helping but are expensive Chart reviewed including relevant office notes and reports Discussed potential prescription assistance for 2024. Patient has talked with [facility] Pharmacist at [facility], Nicholas Lose, and he is working on this. Patient last talked to him last week and is expecting some paperwork in the mail. He will f/u with Nicki Reaper if he hasn't received anything by the end of the week.  Assessed for additional resource or Care Coordination needs. None at this time.  Encouraged to reach out to [facility] if needed         SDOH assessments and interventions completed:  Yes SDOH Interventions Today    Flowsheet Row Most Recent Value  SDOH Interventions   Housing Interventions Intervention Not Indicated  Transportation Interventions Intervention Not Indicated  Financial Strain Interventions Intervention Not Indicated       Care Coordination Interventions:  Yes, provided   Follow up plan: No further intervention required.   Encounter Outcome:  Pt. Visit Completed   Chong Sicilian, BSN, RN-BC RN Care Coordinator [place] Direct Dial: [phone] Main #: [phone]

## 2022-12-25 ENCOUNTER — Other Ambulatory Visit: Payer: Self-pay | Admitting: Internal Medicine

## 2023-01-01 NOTE — Progress Notes (Deleted)
Office Visit Note  Patient: [name]             Date of Birth: March 20, 1951           MRN: [id number]             PCP: [name], MD Referring: [name], MD Visit Date: 01/09/2023 Occupation: @GUAROCC$ @  Subjective:  No chief complaint on file.   History of Present Illness: [name] is a 72 y.o. male ***     Activities of Daily Living:  Patient reports morning stiffness for *** {minute/hour:19697}.   Patient {ACTIONS;DENIES/REPORTS:21021675::"Denies"} nocturnal pain.  Difficulty dressing/grooming: {ACTIONS;DENIES/REPORTS:21021675::"Denies"} Difficulty climbing stairs: {ACTIONS;DENIES/REPORTS:21021675::"Denies"} Difficulty getting out of chair: {ACTIONS;DENIES/REPORTS:21021675::"Denies"} Difficulty using hands for taps, buttons, cutlery, and/or writing: {ACTIONS;DENIES/REPORTS:21021675::"Denies"}  No Rheumatology ROS completed.   PMFS History:  Patient Active Problem List   Diagnosis Date Noted   Diaphragm paralysis 08/28/2022   Chest pain 03/11/2022   Stage 3a chronic kidney disease (CKD) ([place]) 03/11/2022   Bronchiectasis without complication ([place]) 123XX123   Incontinence when straining, male 03/20/2021   Erectile dysfunction after radical prostatectomy 09/08/2020   OAB (overactive bladder) 02/23/2020   Class 2 severe obesity due to excess calories with serious comorbidity and body mass index (BMI) of 35.0 to 35.9 in adult [facility])    Chronic diastolic HF (heart failure) ([place])    Serratia sepsis ([place]) 06/08/2019   Voice hoarseness 04/26/2019   OSA (obstructive sleep apnea) 03/10/2019   Abnormal CT of the chest 02/09/2019   Dyspnea on exertion 10/22/2017   History of seizure disorder 02/27/2017   Primary osteoarthritis of both hands 02/27/2017   Primary osteoarthritis of both feet 02/27/2017   Idiopathic gout of multiple sites 02/27/2017   High risk medication use 12/29/2016   History of prostate cancer 12/29/2016   Primary osteoarthritis of  both knees 12/29/2016   Chronic idiopathic gout involving toe without tophus 12/29/2016   History of CHF (congestive heart failure) 12/29/2016   History of COPD 12/29/2016   Plantar pustular psoriasis 12/29/2016   DVT (deep venous thrombosis) ([place]) 10/31/2016   COPD with acute exacerbation ([place]) 10/31/2016   CHF (congestive heart failure) ([place]) 10/31/2016   Prostate cancer ([place]) 08/30/2015   Malignant neoplasm of prostate ([place]) 07/04/2015   Chest pain at rest 07/05/2014   Weakness [id number]   Complicated postphlebitic syndrome 11/16/2013   Personal history of DVT (deep vein thrombosis) 05/18/2013   Chronic anticoagulation 05/18/2013   Diverticulosis of colon without hemorrhage 05/18/2013   Rheumatoid arthritis ([place]) 05/18/2013   Seizure disorder ([place]) 05/18/2013   Hx of adenomatous colonic polyps 05/18/2013   GERD 05/08/2010   NAUSEA 05/08/2010   FLATULENCE-GAS-BLOATING 05/08/2010    Past Medical History:  Diagnosis Date   Arthritis    RA   Asthma    CHF (congestive heart failure) ([place])    pt denies    Clotting disorder ([place])    DVT both legs    COPD (chronic obstructive pulmonary disease) ([place])    DDD (degenerative disc disease), cervical    with UE's paresthesias   Diverticulosis    DVT, lower extremity ([place])    bilat   Eye abnormality    right eye drifts has difficulty focusing with right eye has had since birth    GERD (gastroesophageal reflux disease)    Gout    History of measles    History of shingles    Hypercholesterolemia    IBS (irritable bowel syndrome)    IBS (irritable  bowel syndrome)    Peripheral edema    Pneumonia    hx of    Prostate cancer ([place])    Rheumatoid arthritis ([place])    Seizures ([place])    last seizure 20 years ago; on dilantin. Unknown origin.   Shortness of breath dyspnea    exertion    Sleep apnea    cpap    Tubular adenoma of colon 04/2008    Family History  Problem Relation Age of Onset   Skin cancer Father    Stomach  cancer Father    Heart attack Father    Alzheimer's disease Mother    Throat cancer Brother    Stomach cancer Brother    Lung cancer Brother    Heart attack Brother    Heart attack Brother    Breast cancer Sister    Heart attack Sister    Stroke Sister    Bladder Cancer Sister    Heart murmur Sister    Colon cancer Brother    Colon polyps Neg Hx    Past Surgical History:  Procedure Laterality Date   CHOLECYSTECTOMY     CIRCUMCISION     COLONOSCOPY     CYSTOSCOPY WITH LITHOLAPAXY N/A 03/17/2019   Procedure: CYSTOSCOPY WITH LITHOLAPAXY AND REMOVAL OF FOREIGN BODY;  Surgeon: [name], MD;  Location: [facility];  Service: Urology;  Laterality: N/A;   DOPPLER ECHOCARDIOGRAPHY N/A 01-21-2012   TECHNICALLY DIFFICULT. MILD CONCENTRIC LV HYPERTROPHY. LV CAVITY IS SMALL.. EF=> 55%. TRANSMITRAL SPECTRAL FLOW PATTREN IS SUGGESTIVE OF IMPAIRED LV RELAXATION. RV SYSTOLIC PRESSURE IS [id number]. LEFT ATRIAL SIZE IS NORMAL. AV APPEARS MILDLY SCLEROTIC. NO SIGN VALVE DISEASE NOTED.   LEFT HEART CATH AND CORONARY ANGIOGRAPHY N/A 03/12/2022   Procedure: LEFT HEART CATH AND CORONARY ANGIOGRAPHY;  Surgeon: Sherren Mocha, MD;  Location: [facility];  Service: Cardiovascular;  Laterality: N/A;   LYMPHADENECTOMY Bilateral 08/30/2015   Procedure: PELVIC LYMPHADENECTOMY;  Surgeon: [name], MD;  Location: [facility];  Service: Urology;  Laterality: Bilateral;   NUCLEAR STRESS TEST N/A 01-21-2012   NORMAL PATTERN OF PERFUSION IN ALL REGIONS. POST STRESS LV SIZE IS NORMAL. NO EVIDENCE OF INDUCIBLE ISCHEMIA. EF 54%.   POLYPECTOMY     PROSTATE BIOPSY     ROBOT ASSISTED LAPAROSCOPIC RADICAL PROSTATECTOMY N/A 08/30/2015   Procedure: ROBOTIC ASSISTED LAPAROSCOPIC RADICAL PROSTATECTOMY;  Surgeon: [name], MD;  Location: [facility];  Service: Urology;  Laterality: N/A;   URINARY SPHINCTER IMPLANT N/A 03/20/2021   Procedure: ARTIFICIAL URINARY SPHINCTER CYSTOSCOPY;  Surgeon: Bjorn Loser, MD;   Location: [facility];  Service: Urology;  Laterality: N/A;  REQUESTING 90 MINS   US VENOUS LOWER EXT Right 03/07/11   PERSISTENT DVT IN RIGHT LOWER EXT.WITH PERSISTANT VISUALIZATION OF HYPOECHOIC THROMBUS WITHIN THE FEMORAL, PROFUNDA FEMORAL AND POPLITEAL VEINS. WHEN COMPARED TO PREVIOUS, CLOT IS NO LONGER IDENTIFIED WITH IN THE RIGHT CFV.   Social History   Social History Narrative   Not on file   Immunization History  Administered Date(s) Administered   Fluad Quad(high Dose 65+) 09/30/2019   Influenza, High Dose Seasonal PF 10/05/2018, 10/10/2021   Influenza,inj,Quad PF,6+ Mos 08/31/2015   Influenza-Unspecified 09/30/2016, 08/22/2017   Moderna Sars-Covid-2 Vaccination 01/21/2020, 02/18/2020, 10/25/2020, 10/10/2021   Pneumococcal Conjugate-13 10/05/2018   Pneumococcal-Unspecified 12/30/2012   Td 07/05/1996     Objective: Vital Signs: There were no vitals taken for this visit.   Physical Exam   Musculoskeletal Exam: ***  CDAI Exam: CDAI Score: --  Patient Global: --; Provider Global: -- Swollen: --; Tender: -- Joint Exam 01/09/2023   No joint exam has been documented for this visit   There is currently no information documented on the homunculus. Go to the Rheumatology activity and complete the homunculus joint exam.  Investigation: No additional findings.  Imaging: No results found.  Recent Labs: Lab Results  Component Value Date   WBC 15.0 (H) 11/08/2022   HGB 14.9 11/08/2022   PLT 198 11/08/2022   NA 139 09/13/2022   K 4.8 09/13/2022   CL 101 09/13/2022   CO2 30 09/13/2022   GLUCOSE 157 (H) 09/13/2022   BUN 17 09/13/2022   CREATININE 1.43 (H) 09/13/2022   BILITOT 0.6 09/13/2022   ALKPHOS 58 09/13/2022   AST 45 (H) 09/13/2022   ALT 42 09/13/2022   PROT 6.2 (L) 09/13/2022   ALBUMIN 3.9 09/13/2022   CALCIUM 8.9 09/13/2022   GFRAA >60 09/20/2020   QFTBGOLDPLUS Negative 02/01/2022    Speciality Comments: ACTEMRA 53m/kg x 4 weeks PPD negative  01/02/17  Procedures:  No procedures performed Allergies: Orencia [abatacept], Carbamazepine, Celecoxib, Cephalexin, Enbrel [etanercept], Humira [adalimumab], Levofloxacin, Sulfa antibiotics, and Sulfasalazine   Assessment / Plan:     Visit Diagnoses: No diagnosis found.  Orders: No orders of the defined types were placed in this encounter.  No orders of the defined types were placed in this encounter.   Face-to-face time spent with patient was *** minutes. Greater than 50% of time was spent in counseling and coordination of care.  Follow-Up Instructions: No follow-ups on file.   MEarnestine Mealing CMA  Note - This record has been created using DEditor, commissioning  Chart creation errors have been sought, but may not always  have been located. Such creation errors do not reflect on  the standard of medical care.

## 2023-01-02 DIAGNOSIS — R350 Frequency of micturition: Secondary | ICD-10-CM | POA: Diagnosis not present

## 2023-01-02 DIAGNOSIS — N3946 Mixed incontinence: Secondary | ICD-10-CM | POA: Diagnosis not present

## 2023-01-03 ENCOUNTER — Other Ambulatory Visit: Payer: Self-pay | Admitting: Pharmacist

## 2023-01-03 ENCOUNTER — Ambulatory Visit (HOSPITAL_COMMUNITY)
Admission: RE | Admit: 2023-01-03 | Discharge: 2023-01-03 | Disposition: A | Discharge status: Home / Self Care | Payer: Medicare Other | Source: Ambulatory Visit | Attending: Rheumatology | Admitting: Rheumatology

## 2023-01-03 VITALS — BP 157/86 | HR 65 | Temp 97.7°F | Resp 18 | Wt 230.0 lb

## 2023-01-03 DIAGNOSIS — Z79899 Other long term (current) drug therapy: Secondary | ICD-10-CM | POA: Diagnosis not present

## 2023-01-03 DIAGNOSIS — M0579 Rheumatoid arthritis with rheumatoid factor of multiple sites without organ or systems involvement: Secondary | ICD-10-CM | POA: Diagnosis not present

## 2023-01-03 DIAGNOSIS — M069 Rheumatoid arthritis, unspecified: Secondary | ICD-10-CM | POA: Insufficient documentation

## 2023-01-03 DIAGNOSIS — Z111 Encounter for screening for respiratory tuberculosis: Secondary | ICD-10-CM

## 2023-01-03 LAB — CBC WITH DIFFERENTIAL/PLATELET
Abs Immature Granulocytes: 0.02 10*3/uL (ref 0.00–0.07)
Basophils Absolute: 0.1 10*3/uL (ref 0.0–0.1)
Basophils Relative: 1 %
Eosinophils Absolute: 0.5 10*3/uL (ref 0.0–0.5)
Eosinophils Relative: 9 %
HCT: 42.2 % (ref 39.0–52.0)
Hemoglobin: 13.9 g/dL (ref 13.0–17.0)
Immature Granulocytes: 0 %
Lymphocytes Relative: 19 %
Lymphs Abs: 1.1 10*3/uL (ref 0.7–4.0)
MCH: 30.2 pg (ref 26.0–34.0)
MCHC: 32.9 g/dL (ref 30.0–36.0)
MCV: 91.5 fL (ref 80.0–100.0)
Monocytes Absolute: 0.8 10*3/uL (ref 0.1–1.0)
Monocytes Relative: 13 %
Neutro Abs: 3.5 10*3/uL (ref 1.7–7.7)
Neutrophils Relative %: 58 %
Platelets: 173 10*3/uL (ref 150–400)
RBC: 4.61 MIL/uL (ref 4.22–5.81)
RDW: 13.7 % (ref 11.5–15.5)
WBC: 6 10*3/uL (ref 4.0–10.5)
nRBC: 0 % (ref 0.0–0.2)

## 2023-01-03 LAB — COMPREHENSIVE METABOLIC PANEL
ALT: 27 U/L (ref 0–44)
AST: 35 U/L (ref 15–41)
Albumin: 4 g/dL (ref 3.5–5.0)
Alkaline Phosphatase: 50 U/L (ref 38–126)
Anion gap: 9 (ref 5–15)
BUN: 14 mg/dL (ref 8–23)
CO2: 30 mmol/L (ref 22–32)
Calcium: 9.1 mg/dL (ref 8.9–10.3)
Chloride: 101 mmol/L (ref 98–111)
Creatinine, Ser: 1.47 mg/dL — ABNORMAL HIGH (ref 0.61–1.24)
GFR, Estimated: 50 mL/min — ABNORMAL LOW (ref >60)
Glucose, Bld: 138 mg/dL — ABNORMAL HIGH (ref 70–99)
Potassium: 3.9 mmol/L (ref 3.5–5.1)
Sodium: 140 mmol/L (ref 135–145)
Total Bilirubin: 0.4 mg/dL (ref 0.3–1.2)
Total Protein: 6.5 g/dL (ref 6.5–8.1)

## 2023-01-03 MED ORDER — DIPHENHYDRAMINE HCL 25 MG PO CAPS: 25.0000 mg | ORAL_CAPSULE | ORAL | Status: DC

## 2023-01-03 MED ORDER — ACETAMINOPHEN 325 MG PO TABS: 650.0000 mg | ORAL_TABLET | ORAL | Status: DC

## 2023-01-03 MED ORDER — TOCILIZUMAB 400 MG/20ML IV SOLN
400.0000 mg | INTRAVENOUS | Status: DC
  Administered 2023-01-03: 400 mg via INTRAVENOUS
  Filled 2023-01-03: qty 20

## 2023-01-03 NOTE — Progress Notes (Signed)
Next infusion scheduled for Actemra IV on 02/07/2023 and due for updated orders. Patient recently hospitalized on 01/23/2023 through 01/25/2023 for COPD exacerbation. Appears he was not discharged with antibiotics and is not actively being treated so can proceed with infusion Diagnosis: RA  Dose: '4mg'$ /kg every 4 weeks  Last Clinic Visit: 07/24/2023 Next Clinic Visit: not yet scheduled but have sent message to scheduling team to r/s the appt that was cancelled from 01/30/2023  Last infusion: 01/03/2023  Labs: CBC on 1/27//2024 and CMP on 01/23/2023 - platelet, LFTs, and ANC stable to proceed with infusion TB Gold: negative on 02/01/2022   Orders placed for Actemra IV x 3 doses along with premedication of acetaminophen and diphenhydramine to be administered 30 minutes before medication infusion.  Standing CBC with diff/platelet and CMP with GFR orders placed to be drawn every 2 months.  Next TB gold due 02/01/2023 (order placed today)  Knox Saliva, PharmD, MPH, BCPS, CPP Clinical Pharmacist (Rheumatology and Pulmonology)

## 2023-01-06 NOTE — Progress Notes (Signed)
Creatinine remains elevated at 1.47, most likely due to diuretic use.  CBC is normal.  Please forward results to his PCP.

## 2023-01-09 ENCOUNTER — Ambulatory Visit: Payer: Medicare Other | Attending: Rheumatology | Admitting: Rheumatology

## 2023-01-09 DIAGNOSIS — Z8679 Personal history of other diseases of the circulatory system: Secondary | ICD-10-CM

## 2023-01-09 DIAGNOSIS — M0579 Rheumatoid arthritis with rheumatoid factor of multiple sites without organ or systems involvement: Secondary | ICD-10-CM

## 2023-01-09 DIAGNOSIS — Z8669 Personal history of other diseases of the nervous system and sense organs: Secondary | ICD-10-CM

## 2023-01-09 DIAGNOSIS — M19041 Primary osteoarthritis, right hand: Secondary | ICD-10-CM

## 2023-01-09 DIAGNOSIS — M1A0790 Idiopathic chronic gout, unspecified ankle and foot, without tophus (tophi): Secondary | ICD-10-CM

## 2023-01-09 DIAGNOSIS — Z8546 Personal history of malignant neoplasm of prostate: Secondary | ICD-10-CM

## 2023-01-09 DIAGNOSIS — M170 Bilateral primary osteoarthritis of knee: Secondary | ICD-10-CM

## 2023-01-09 DIAGNOSIS — Z86718 Personal history of other venous thrombosis and embolism: Secondary | ICD-10-CM

## 2023-01-09 DIAGNOSIS — M542 Cervicalgia: Secondary | ICD-10-CM

## 2023-01-09 DIAGNOSIS — Z79899 Other long term (current) drug therapy: Secondary | ICD-10-CM

## 2023-01-09 DIAGNOSIS — Z86010 Personal history of colonic polyps: Secondary | ICD-10-CM

## 2023-01-09 DIAGNOSIS — Z8709 Personal history of other diseases of the respiratory system: Secondary | ICD-10-CM

## 2023-01-09 DIAGNOSIS — M19071 Primary osteoarthritis, right ankle and foot: Secondary | ICD-10-CM

## 2023-01-09 DIAGNOSIS — M810 Age-related osteoporosis without current pathological fracture: Secondary | ICD-10-CM

## 2023-01-09 DIAGNOSIS — Z8719 Personal history of other diseases of the digestive system: Secondary | ICD-10-CM

## 2023-01-10 ENCOUNTER — Other Ambulatory Visit: Payer: Self-pay | Admitting: Physician Assistant

## 2023-01-10 NOTE — Telephone Encounter (Signed)
Please schedule patient a follow up visit. Patient due January 2024. Thanks!

## 2023-01-10 NOTE — Telephone Encounter (Signed)
Next Visit: Due January 2024. Message sent to the front to schedule.   Last Visit: 07/23/2022  Last Fill: 10/21/2022  DX: Age-related osteoporosis without current pathological fracture   Current Dose per office note 10/19/2022:  fosamax 70 mg 1 tablet by mouth once weekly for management of osteoporosis.    Labs: 01/03/2023 Creatinine remains elevated at 1.47, most likely due to diuretic use.  CBC is normal.   Okay to refill Fosamax?

## 2023-01-15 DIAGNOSIS — J069 Acute upper respiratory infection, unspecified: Secondary | ICD-10-CM | POA: Diagnosis not present

## 2023-01-15 DIAGNOSIS — J441 Chronic obstructive pulmonary disease with (acute) exacerbation: Secondary | ICD-10-CM | POA: Diagnosis not present

## 2023-01-20 NOTE — Progress Notes (Deleted)
Office Visit Note  Patient: [name]             Date of Birth: May 19, 1951           MRN: [id number]             PCP: [name], MD Referring: [name], MD Visit Date: 01/30/2023 Occupation: '@GUAROCC'$ @  Subjective:  No chief complaint on file.   History of Present Illness: [name] is a 72 y.o. male ***     Activities of Daily Living:  Patient reports morning stiffness for *** {minute/hour:19697}.   Patient {ACTIONS;DENIES/REPORTS:21021675::"Denies"} nocturnal pain.  Difficulty dressing/grooming: {ACTIONS;DENIES/REPORTS:21021675::"Denies"} Difficulty climbing stairs: {ACTIONS;DENIES/REPORTS:21021675::"Denies"} Difficulty getting out of chair: {ACTIONS;DENIES/REPORTS:21021675::"Denies"} Difficulty using hands for taps, buttons, cutlery, and/or writing: {ACTIONS;DENIES/REPORTS:21021675::"Denies"}  No Rheumatology ROS completed.   PMFS History:  Patient Active Problem List   Diagnosis Date Noted   Diaphragm paralysis 08/28/2022   Chest pain 03/11/2022   Stage 3a chronic kidney disease (CKD) ([place]) 03/11/2022   Bronchiectasis without complication ([place]) [id number]   Incontinence when straining, male 03/20/2021   Erectile dysfunction after radical prostatectomy 09/08/2020   OAB (overactive bladder) 02/23/2020   Class 2 severe obesity due to excess calories with serious comorbidity and body mass index (BMI) of 35.0 to 35.9 in adult [facility])    Chronic diastolic HF (heart failure) ([place])    Serratia sepsis ([place]) 06/08/2019   Voice hoarseness 04/26/2019   OSA (obstructive sleep apnea) 03/10/2019   Abnormal CT of the chest 02/09/2019   Dyspnea on exertion 10/22/2017   History of seizure disorder 02/27/2017   Primary osteoarthritis of both hands 02/27/2017   Primary osteoarthritis of both feet 02/27/2017   Idiopathic gout of multiple sites 02/27/2017   High risk medication use 12/29/2016   History of prostate cancer 12/29/2016   Primary osteoarthritis of  both knees 12/29/2016   Chronic idiopathic gout involving toe without tophus 12/29/2016   History of CHF (congestive heart failure) 12/29/2016   History of COPD 12/29/2016   Plantar pustular psoriasis 12/29/2016   DVT (deep venous thrombosis) ([place]) 10/31/2016   COPD with acute exacerbation ([place]) 10/31/2016   CHF (congestive heart failure) ([place]) 10/31/2016   Prostate cancer ([place]) 08/30/2015   Malignant neoplasm of prostate ([place]) 07/04/2015   Chest pain at rest 07/05/2014   Weakness [id number]   Complicated postphlebitic syndrome 11/16/2013   Personal history of DVT (deep vein thrombosis) 05/18/2013   Chronic anticoagulation 05/18/2013   Diverticulosis of colon without hemorrhage 05/18/2013   Rheumatoid arthritis ([place]) 05/18/2013   Seizure disorder ([place]) 05/18/2013   Hx of adenomatous colonic polyps 05/18/2013   GERD 05/08/2010   NAUSEA 05/08/2010   FLATULENCE-GAS-BLOATING 05/08/2010    Past Medical History:  Diagnosis Date   Arthritis    RA   Asthma    CHF (congestive heart failure) ([place])    pt denies    Clotting disorder ([place])    DVT both legs    COPD (chronic obstructive pulmonary disease) ([place])    DDD (degenerative disc disease), cervical    with UE's paresthesias   Diverticulosis    DVT, lower extremity ([place])    bilat   Eye abnormality    right eye drifts has difficulty focusing with right eye has had since birth    GERD (gastroesophageal reflux disease)    Gout    History of measles    History of shingles    Hypercholesterolemia    IBS (irritable bowel syndrome)    IBS (irritable  bowel syndrome)    Peripheral edema    Pneumonia    hx of    Prostate cancer ([place])    Rheumatoid arthritis ([place])    Seizures ([place])    last seizure 20 years ago; on dilantin. Unknown origin.   Shortness of breath dyspnea    exertion    Sleep apnea    cpap    Tubular adenoma of colon 04/2008    Family History  Problem Relation Age of Onset   Skin cancer Father    Stomach  cancer Father    Heart attack Father    Alzheimer's disease Mother    Throat cancer Brother    Stomach cancer Brother    Lung cancer Brother    Heart attack Brother    Heart attack Brother    Breast cancer Sister    Heart attack Sister    Stroke Sister    Bladder Cancer Sister    Heart murmur Sister    Colon cancer Brother    Colon polyps Neg Hx    Past Surgical History:  Procedure Laterality Date   CHOLECYSTECTOMY     CIRCUMCISION     COLONOSCOPY     CYSTOSCOPY WITH LITHOLAPAXY N/A 03/17/2019   Procedure: CYSTOSCOPY WITH LITHOLAPAXY AND REMOVAL OF FOREIGN BODY;  Surgeon: [name], MD;  Location: [facility];  Service: Urology;  Laterality: N/A;   DOPPLER ECHOCARDIOGRAPHY N/A 01-21-2012   TECHNICALLY DIFFICULT. MILD CONCENTRIC LV HYPERTROPHY. LV CAVITY IS SMALL.. EF=> 55%. TRANSMITRAL SPECTRAL FLOW PATTREN IS SUGGESTIVE OF IMPAIRED LV RELAXATION. RV SYSTOLIC PRESSURE IS [id number]. LEFT ATRIAL SIZE IS NORMAL. AV APPEARS MILDLY SCLEROTIC. NO SIGN VALVE DISEASE NOTED.   LEFT HEART CATH AND CORONARY ANGIOGRAPHY N/A 03/12/2022   Procedure: LEFT HEART CATH AND CORONARY ANGIOGRAPHY;  Surgeon: Sherren Mocha, MD;  Location: [facility];  Service: Cardiovascular;  Laterality: N/A;   LYMPHADENECTOMY Bilateral 08/30/2015   Procedure: PELVIC LYMPHADENECTOMY;  Surgeon: [name], MD;  Location: [facility];  Service: Urology;  Laterality: Bilateral;   NUCLEAR STRESS TEST N/A 01-21-2012   NORMAL PATTERN OF PERFUSION IN ALL REGIONS. POST STRESS LV SIZE IS NORMAL. NO EVIDENCE OF INDUCIBLE ISCHEMIA. EF 54%.   POLYPECTOMY     PROSTATE BIOPSY     ROBOT ASSISTED LAPAROSCOPIC RADICAL PROSTATECTOMY N/A 08/30/2015   Procedure: ROBOTIC ASSISTED LAPAROSCOPIC RADICAL PROSTATECTOMY;  Surgeon: [name], MD;  Location: [facility];  Service: Urology;  Laterality: N/A;   URINARY SPHINCTER IMPLANT N/A 03/20/2021   Procedure: ARTIFICIAL URINARY SPHINCTER CYSTOSCOPY;  Surgeon: Bjorn Loser, MD;   Location: [facility];  Service: Urology;  Laterality: N/A;  REQUESTING 90 MINS   US VENOUS LOWER EXT Right 03/07/11   PERSISTENT DVT IN RIGHT LOWER EXT.WITH PERSISTANT VISUALIZATION OF HYPOECHOIC THROMBUS WITHIN THE FEMORAL, PROFUNDA FEMORAL AND POPLITEAL VEINS. WHEN COMPARED TO PREVIOUS, CLOT IS NO LONGER IDENTIFIED WITH IN THE RIGHT CFV.   Social History   Social History Narrative   Not on file   Immunization History  Administered Date(s) Administered   Fluad Quad(high Dose 65+) 09/30/2019   Influenza, High Dose Seasonal PF 10/05/2018, 10/10/2021   Influenza,inj,Quad PF,6+ Mos 08/31/2015   Influenza-Unspecified 09/30/2016, 08/22/2017   Moderna Sars-Covid-2 Vaccination 01/21/2020, 02/18/2020, 10/25/2020, 10/10/2021   Pneumococcal Conjugate-13 10/05/2018   Pneumococcal-Unspecified 12/30/2012   Td 07/05/1996     Objective: Vital Signs: There were no vitals taken for this visit.   Physical Exam   Musculoskeletal Exam: ***  CDAI Exam: CDAI Score: --  Patient Global: --; Provider Global: -- Swollen: --; Tender: -- Joint Exam 01/30/2023   No joint exam has been documented for this visit   There is currently no information documented on the homunculus. Go to the Rheumatology activity and complete the homunculus joint exam.  Investigation: No additional findings.  Imaging: No results found.  Recent Labs: Lab Results  Component Value Date   WBC 6.0 01/03/2023   HGB 13.9 01/03/2023   PLT 173 01/03/2023   NA 140 01/03/2023   K 3.9 01/03/2023   CL 101 01/03/2023   CO2 30 01/03/2023   GLUCOSE 138 (H) 01/03/2023   BUN 14 01/03/2023   CREATININE 1.47 (H) 01/03/2023   BILITOT 0.4 01/03/2023   ALKPHOS 50 01/03/2023   AST 35 01/03/2023   ALT 27 01/03/2023   PROT 6.5 01/03/2023   ALBUMIN 4.0 01/03/2023   CALCIUM 9.1 01/03/2023   GFRAA >60 09/20/2020   QFTBGOLDPLUS Negative 02/01/2022    Speciality Comments: ACTEMRA '4mg'$ /kg x 4 weeks PPD negative 01/02/17  Procedures:  No  procedures performed Allergies: Orencia [abatacept], Carbamazepine, Celecoxib, Cephalexin, Enbrel [etanercept], Humira [adalimumab], Levofloxacin, Sulfa antibiotics, and Sulfasalazine   Assessment / Plan:     Visit Diagnoses: Rheumatoid arthritis involving multiple sites with positive rheumatoid factor (HCC)  High risk medication use  Chronic idiopathic gout involving toe without tophus, unspecified laterality  Primary osteoarthritis of both hands  Primary osteoarthritis of both knees  Primary osteoarthritis of both feet  Age-related osteoporosis without current pathological fracture  History of abscessed tooth  History of COPD  History of adenomatous polyp of colon  History of seizure disorder  History of CHF (congestive heart failure)  History of prostate cancer  History of DVT (deep vein thrombosis)  Orders: No orders of the defined types were placed in this encounter.  No orders of the defined types were placed in this encounter.   Face-to-face time spent with patient was *** minutes. Greater than 50% of time was spent in counseling and coordination of care.  Follow-Up Instructions: No follow-ups on file.   Ofilia Neas, PA-C  Note - This record has been created using Dragon software.  Chart creation errors have been sought, but may not always  have been located. Such creation errors do not reflect on  the standard of medical care.

## 2023-01-23 ENCOUNTER — Other Ambulatory Visit: Payer: Self-pay

## 2023-01-23 ENCOUNTER — Emergency Department (HOSPITAL_COMMUNITY): Payer: Medicare Other

## 2023-01-23 ENCOUNTER — Encounter (HOSPITAL_COMMUNITY): Payer: Self-pay | Admitting: Emergency Medicine

## 2023-01-23 ENCOUNTER — Inpatient Hospital Stay (HOSPITAL_COMMUNITY)
Admission: EM | Admit: 2023-01-23 | Discharge: 2023-01-25 | DRG: 190 | Disposition: A | Discharge status: Home / Self Care | Payer: Medicare Other | Attending: Family Medicine | Admitting: Family Medicine

## 2023-01-23 DIAGNOSIS — R110 Nausea: Secondary | ICD-10-CM | POA: Diagnosis not present

## 2023-01-23 DIAGNOSIS — Z6834 Body mass index (BMI) 34.0-34.9, adult: Secondary | ICD-10-CM

## 2023-01-23 DIAGNOSIS — Z1152 Encounter for screening for COVID-19: Secondary | ICD-10-CM

## 2023-01-23 DIAGNOSIS — R062 Wheezing: Secondary | ICD-10-CM | POA: Diagnosis not present

## 2023-01-23 DIAGNOSIS — Z7951 Long term (current) use of inhaled steroids: Secondary | ICD-10-CM | POA: Diagnosis not present

## 2023-01-23 DIAGNOSIS — Z8249 Family history of ischemic heart disease and other diseases of the circulatory system: Secondary | ICD-10-CM

## 2023-01-23 DIAGNOSIS — Z7901 Long term (current) use of anticoagulants: Secondary | ICD-10-CM

## 2023-01-23 DIAGNOSIS — N1831 Chronic kidney disease, stage 3a: Secondary | ICD-10-CM | POA: Diagnosis present

## 2023-01-23 DIAGNOSIS — E876 Hypokalemia: Secondary | ICD-10-CM | POA: Diagnosis present

## 2023-01-23 DIAGNOSIS — J479 Bronchiectasis, uncomplicated: Secondary | ICD-10-CM

## 2023-01-23 DIAGNOSIS — E669 Obesity, unspecified: Secondary | ICD-10-CM | POA: Diagnosis present

## 2023-01-23 DIAGNOSIS — E872 Acidosis, unspecified: Secondary | ICD-10-CM | POA: Diagnosis present

## 2023-01-23 DIAGNOSIS — Z8052 Family history of malignant neoplasm of bladder: Secondary | ICD-10-CM

## 2023-01-23 DIAGNOSIS — G4733 Obstructive sleep apnea (adult) (pediatric): Secondary | ICD-10-CM | POA: Diagnosis present

## 2023-01-23 DIAGNOSIS — G40909 Epilepsy, unspecified, not intractable, without status epilepticus: Secondary | ICD-10-CM | POA: Diagnosis present

## 2023-01-23 DIAGNOSIS — Z801 Family history of malignant neoplasm of trachea, bronchus and lung: Secondary | ICD-10-CM

## 2023-01-23 DIAGNOSIS — M069 Rheumatoid arthritis, unspecified: Secondary | ICD-10-CM | POA: Diagnosis present

## 2023-01-23 DIAGNOSIS — K219 Gastro-esophageal reflux disease without esophagitis: Secondary | ICD-10-CM | POA: Diagnosis present

## 2023-01-23 DIAGNOSIS — Z86718 Personal history of other venous thrombosis and embolism: Secondary | ICD-10-CM

## 2023-01-23 DIAGNOSIS — Z8546 Personal history of malignant neoplasm of prostate: Secondary | ICD-10-CM

## 2023-01-23 DIAGNOSIS — Z87891 Personal history of nicotine dependence: Secondary | ICD-10-CM

## 2023-01-23 DIAGNOSIS — J9601 Acute respiratory failure with hypoxia: Principal | ICD-10-CM | POA: Diagnosis present

## 2023-01-23 DIAGNOSIS — Z882 Allergy status to sulfonamides status: Secondary | ICD-10-CM

## 2023-01-23 DIAGNOSIS — Z808 Family history of malignant neoplasm of other organs or systems: Secondary | ICD-10-CM

## 2023-01-23 DIAGNOSIS — Z881 Allergy status to other antibiotic agents status: Secondary | ICD-10-CM

## 2023-01-23 DIAGNOSIS — J9811 Atelectasis: Secondary | ICD-10-CM | POA: Diagnosis not present

## 2023-01-23 DIAGNOSIS — Z7983 Long term (current) use of bisphosphonates: Secondary | ICD-10-CM | POA: Diagnosis not present

## 2023-01-23 DIAGNOSIS — Z8619 Personal history of other infectious and parasitic diseases: Secondary | ICD-10-CM

## 2023-01-23 DIAGNOSIS — Z888 Allergy status to other drugs, medicaments and biological substances status: Secondary | ICD-10-CM

## 2023-01-23 DIAGNOSIS — R059 Cough, unspecified: Secondary | ICD-10-CM | POA: Diagnosis not present

## 2023-01-23 DIAGNOSIS — E7800 Pure hypercholesterolemia, unspecified: Secondary | ICD-10-CM | POA: Diagnosis present

## 2023-01-23 DIAGNOSIS — J441 Chronic obstructive pulmonary disease with (acute) exacerbation: Secondary | ICD-10-CM | POA: Diagnosis not present

## 2023-01-23 DIAGNOSIS — R0602 Shortness of breath: Secondary | ICD-10-CM | POA: Diagnosis not present

## 2023-01-23 DIAGNOSIS — Z823 Family history of stroke: Secondary | ICD-10-CM

## 2023-01-23 DIAGNOSIS — I5032 Chronic diastolic (congestive) heart failure: Secondary | ICD-10-CM | POA: Diagnosis present

## 2023-01-23 DIAGNOSIS — Z803 Family history of malignant neoplasm of breast: Secondary | ICD-10-CM

## 2023-01-23 DIAGNOSIS — Z800 Family history of malignant neoplasm of digestive organs: Secondary | ICD-10-CM | POA: Diagnosis not present

## 2023-01-23 DIAGNOSIS — Z8709 Personal history of other diseases of the respiratory system: Secondary | ICD-10-CM

## 2023-01-23 LAB — URINALYSIS, ROUTINE W REFLEX MICROSCOPIC
Bilirubin Urine: NEGATIVE
Glucose, UA: NEGATIVE mg/dL
Hgb urine dipstick: NEGATIVE
Ketones, ur: NEGATIVE mg/dL
Leukocytes,Ua: NEGATIVE
Nitrite: NEGATIVE
Protein, ur: NEGATIVE mg/dL
Specific Gravity, Urine: 1.014 (ref 1.005–1.030)
pH: 5 (ref 5.0–8.0)

## 2023-01-23 LAB — BLOOD GAS, ARTERIAL
Acid-Base Excess: 3.2 mmol/L — ABNORMAL HIGH (ref 0.0–2.0)
Bicarbonate: 27.9 mmol/L (ref 20.0–28.0)
Drawn by: 27404
FIO2: 21 %
O2 Saturation: 93.1 %
Patient temperature: 36.6
pCO2 arterial: 41 mmHg (ref 32–48)
pH, Arterial: 7.44 (ref 7.35–7.45)
pO2, Arterial: 61 mmHg — ABNORMAL LOW (ref 83–108)

## 2023-01-23 LAB — CBC WITH DIFFERENTIAL/PLATELET
Abs Immature Granulocytes: 0.04 10*3/uL (ref 0.00–0.07)
Basophils Absolute: 0.1 10*3/uL (ref 0.0–0.1)
Basophils Relative: 1 %
Eosinophils Absolute: 0.2 10*3/uL (ref 0.0–0.5)
Eosinophils Relative: 3 %
HCT: 44.7 % (ref 39.0–52.0)
Hemoglobin: 15.2 g/dL (ref 13.0–17.0)
Immature Granulocytes: 1 %
Lymphocytes Relative: 15 %
Lymphs Abs: 1.1 10*3/uL (ref 0.7–4.0)
MCH: 29.9 pg (ref 26.0–34.0)
MCHC: 34 g/dL (ref 30.0–36.0)
MCV: 87.8 fL (ref 80.0–100.0)
Monocytes Absolute: 1.1 10*3/uL — ABNORMAL HIGH (ref 0.1–1.0)
Monocytes Relative: 15 %
Neutro Abs: 4.8 10*3/uL (ref 1.7–7.7)
Neutrophils Relative %: 65 %
Platelets: 151 10*3/uL (ref 150–400)
RBC: 5.09 MIL/uL (ref 4.22–5.81)
RDW: 13.5 % (ref 11.5–15.5)
WBC: 7.3 10*3/uL (ref 4.0–10.5)
nRBC: 0 % (ref 0.0–0.2)

## 2023-01-23 LAB — COMPREHENSIVE METABOLIC PANEL
ALT: 29 U/L (ref 0–44)
AST: 46 U/L — ABNORMAL HIGH (ref 15–41)
Albumin: 4 g/dL (ref 3.5–5.0)
Alkaline Phosphatase: 62 U/L (ref 38–126)
Anion gap: 14 (ref 5–15)
BUN: 21 mg/dL (ref 8–23)
CO2: 27 mmol/L (ref 22–32)
Calcium: 9.5 mg/dL (ref 8.9–10.3)
Chloride: 98 mmol/L (ref 98–111)
Creatinine, Ser: 1.4 mg/dL — ABNORMAL HIGH (ref 0.61–1.24)
GFR, Estimated: 53 mL/min — ABNORMAL LOW (ref >60)
Glucose, Bld: 125 mg/dL — ABNORMAL HIGH (ref 70–99)
Potassium: 2.8 mmol/L — ABNORMAL LOW (ref 3.5–5.1)
Sodium: 139 mmol/L (ref 135–145)
Total Bilirubin: 1.1 mg/dL (ref 0.3–1.2)
Total Protein: 6.8 g/dL (ref 6.5–8.1)

## 2023-01-23 LAB — LACTIC ACID, PLASMA
Lactic Acid, Venous: 2.2 mmol/L (ref 0.5–1.9)
Lactic Acid, Venous: 6.5 mmol/L (ref 0.5–1.9)
Lactic Acid, Venous: 4.5 mmol/L (ref 0.5–1.9)

## 2023-01-23 LAB — PROTIME-INR
INR: 3 — ABNORMAL HIGH (ref 0.8–1.2)
Prothrombin Time: 30.9 seconds — ABNORMAL HIGH (ref 11.4–15.2)

## 2023-01-23 LAB — RESP PANEL BY RT-PCR (RSV, FLU A&B, COVID)  RVPGX2
Influenza A by PCR: NEGATIVE
Influenza B by PCR: NEGATIVE
Resp Syncytial Virus by PCR: NEGATIVE
SARS Coronavirus 2 by RT PCR: NEGATIVE

## 2023-01-23 LAB — MAGNESIUM: Magnesium: 1.5 mg/dL — ABNORMAL LOW (ref 1.7–2.4)

## 2023-01-23 LAB — BRAIN NATRIURETIC PEPTIDE: B Natriuretic Peptide: 21 pg/mL (ref 0.0–100.0)

## 2023-01-23 MED ORDER — LEVOFLOXACIN IN D5W 750 MG/150ML IV SOLN: 750.0000 mg | Freq: Once | INTRAVENOUS | Status: DC

## 2023-01-23 MED ORDER — WARFARIN SODIUM 2 MG PO TABS
2.0000 mg | ORAL_TABLET | Freq: Once | ORAL | Status: AC
  Administered 2023-01-23: 2 mg via ORAL
  Filled 2023-01-23 (×2): qty 1

## 2023-01-23 MED ORDER — TRAZODONE HCL 50 MG PO TABS: 50.0000 mg | ORAL_TABLET | Freq: Every evening | ORAL | Status: DC | PRN

## 2023-01-23 MED ORDER — ROSUVASTATIN CALCIUM 10 MG PO TABS
10.0000 mg | ORAL_TABLET | Freq: Every day | ORAL | Status: DC
  Administered 2023-01-24 – 2023-01-25 (×2): 10 mg via ORAL
  Filled 2023-01-23 (×2): qty 1

## 2023-01-23 MED ORDER — SODIUM CHLORIDE 0.9 % IV SOLN: INTRAVENOUS | Status: DC | PRN

## 2023-01-23 MED ORDER — ACETAMINOPHEN 650 MG RE SUPP: 650.0000 mg | Freq: Four times a day (QID) | RECTAL | Status: DC | PRN

## 2023-01-23 MED ORDER — SODIUM CHLORIDE 0.9% FLUSH
3.0000 mL | Freq: Two times a day (BID) | INTRAVENOUS | Status: DC
  Administered 2023-01-23 – 2023-01-25 (×4): 3 mL via INTRAVENOUS

## 2023-01-23 MED ORDER — PANTOPRAZOLE SODIUM 40 MG PO TBEC
40.0000 mg | DELAYED_RELEASE_TABLET | Freq: Every day | ORAL | Status: DC
  Administered 2023-01-24 – 2023-01-25 (×2): 40 mg via ORAL
  Filled 2023-01-23 (×2): qty 1

## 2023-01-23 MED ORDER — HYDROCOD POLI-CHLORPHE POLI ER 10-8 MG/5ML PO SUER
10.0000 mL | Freq: Two times a day (BID) | ORAL | Status: AC
  Administered 2023-01-23 – 2023-01-24 (×3): 10 mL via ORAL
  Filled 2023-01-23 (×3): qty 10

## 2023-01-23 MED ORDER — ACETAMINOPHEN 325 MG PO TABS
650.0000 mg | ORAL_TABLET | Freq: Four times a day (QID) | ORAL | Status: DC | PRN
  Administered 2023-01-24: 650 mg via ORAL
  Filled 2023-01-23: qty 2

## 2023-01-23 MED ORDER — POTASSIUM CHLORIDE 10 MEQ/100ML IV SOLN
10.0000 meq | Freq: Once | INTRAVENOUS | Status: AC
  Administered 2023-01-23: 10 meq via INTRAVENOUS
  Filled 2023-01-23: qty 100

## 2023-01-23 MED ORDER — AZITHROMYCIN 250 MG PO TABS
500.0000 mg | ORAL_TABLET | Freq: Every day | ORAL | Status: DC
  Administered 2023-01-23 – 2023-01-25 (×3): 500 mg via ORAL
  Filled 2023-01-23 (×3): qty 2

## 2023-01-23 MED ORDER — POLYETHYLENE GLYCOL 3350 17 G PO PACK: 17.0000 g | PACK | Freq: Every day | ORAL | Status: DC | PRN

## 2023-01-23 MED ORDER — BISACODYL 10 MG RE SUPP: 10.0000 mg | Freq: Every day | RECTAL | Status: DC | PRN

## 2023-01-23 MED ORDER — METHYLPREDNISOLONE SODIUM SUCC 40 MG IJ SOLR
40.0000 mg | Freq: Two times a day (BID) | INTRAMUSCULAR | Status: DC
  Administered 2023-01-23 – 2023-01-25 (×4): 40 mg via INTRAVENOUS
  Filled 2023-01-23 (×4): qty 1

## 2023-01-23 MED ORDER — PREDNISONE 20 MG PO TABS: 40.0000 mg | ORAL_TABLET | Freq: Once | ORAL | Status: DC

## 2023-01-23 MED ORDER — IPRATROPIUM-ALBUTEROL 0.5-2.5 (3) MG/3ML IN SOLN
3.0000 mL | Freq: Four times a day (QID) | RESPIRATORY_TRACT | Status: DC
  Administered 2023-01-23 – 2023-01-24 (×5): 3 mL via RESPIRATORY_TRACT
  Filled 2023-01-23 (×5): qty 3

## 2023-01-23 MED ORDER — ALLOPURINOL 100 MG PO TABS
100.0000 mg | ORAL_TABLET | Freq: Two times a day (BID) | ORAL | Status: DC
  Administered 2023-01-23 – 2023-01-25 (×4): 100 mg via ORAL
  Filled 2023-01-23 (×4): qty 1

## 2023-01-23 MED ORDER — LACTATED RINGERS IV BOLUS (SEPSIS)
1000.0000 mL | Freq: Once | INTRAVENOUS | Status: AC
  Administered 2023-01-23: 1000 mL via INTRAVENOUS

## 2023-01-23 MED ORDER — METHYLPREDNISOLONE SODIUM SUCC 125 MG IJ SOLR
125.0000 mg | Freq: Once | INTRAMUSCULAR | Status: AC
  Administered 2023-01-23: 125 mg via INTRAVENOUS
  Filled 2023-01-23: qty 2

## 2023-01-23 MED ORDER — SPIRONOLACTONE 25 MG PO TABS
50.0000 mg | ORAL_TABLET | Freq: Every day | ORAL | Status: DC
  Administered 2023-01-24 – 2023-01-25 (×2): 50 mg via ORAL
  Filled 2023-01-23 (×2): qty 2

## 2023-01-23 MED ORDER — UMECLIDINIUM BROMIDE 62.5 MCG/ACT IN AEPB
1.0000 | INHALATION_SPRAY | Freq: Every day | RESPIRATORY_TRACT | Status: DC
  Administered 2023-01-25: 1 via RESPIRATORY_TRACT
  Filled 2023-01-23: qty 7

## 2023-01-23 MED ORDER — ONDANSETRON HCL 4 MG PO TABS
4.0000 mg | ORAL_TABLET | Freq: Four times a day (QID) | ORAL | Status: DC | PRN
  Administered 2023-01-25: 4 mg via ORAL
  Filled 2023-01-23: qty 1

## 2023-01-23 MED ORDER — PHENYTOIN SODIUM EXTENDED 100 MG PO CAPS
200.0000 mg | ORAL_CAPSULE | Freq: Every day | ORAL | Status: DC
  Administered 2023-01-23: 200 mg via ORAL
  Filled 2023-01-23 (×2): qty 2

## 2023-01-23 MED ORDER — FUROSEMIDE 40 MG PO TABS
80.0000 mg | ORAL_TABLET | Freq: Every day | ORAL | Status: DC
  Administered 2023-01-24 – 2023-01-25 (×2): 80 mg via ORAL
  Filled 2023-01-23 (×2): qty 2

## 2023-01-23 MED ORDER — ONDANSETRON HCL 4 MG/2ML IJ SOLN
4.0000 mg | Freq: Once | INTRAMUSCULAR | Status: AC
  Administered 2023-01-23: 4 mg via INTRAVENOUS
  Filled 2023-01-23: qty 2

## 2023-01-23 MED ORDER — SODIUM CHLORIDE 0.9% FLUSH: 3.0000 mL | INTRAVENOUS | Status: DC | PRN

## 2023-01-23 MED ORDER — ALBUTEROL (5 MG/ML) CONTINUOUS INHALATION SOLN
10.0000 mg | INHALATION_SOLUTION | RESPIRATORY_TRACT | Status: AC
  Administered 2023-01-23: 10 mg via RESPIRATORY_TRACT
  Filled 2023-01-23 (×2): qty 20

## 2023-01-23 MED ORDER — FOLIC ACID 1 MG PO TABS
2.0000 mg | ORAL_TABLET | Freq: Every morning | ORAL | Status: DC
  Administered 2023-01-24 – 2023-01-25 (×2): 2 mg via ORAL
  Filled 2023-01-23 (×2): qty 2

## 2023-01-23 MED ORDER — POTASSIUM CHLORIDE CRYS ER 20 MEQ PO TBCR
40.0000 meq | EXTENDED_RELEASE_TABLET | ORAL | Status: AC
  Administered 2023-01-23 (×2): 40 meq via ORAL
  Filled 2023-01-23 (×2): qty 2

## 2023-01-23 MED ORDER — ALBUTEROL SULFATE (2.5 MG/3ML) 0.083% IN NEBU: 2.5000 mg | INHALATION_SOLUTION | RESPIRATORY_TRACT | Status: DC | PRN

## 2023-01-23 MED ORDER — ALBUTEROL (5 MG/ML) CONTINUOUS INHALATION SOLN
10.0000 mg | INHALATION_SOLUTION | RESPIRATORY_TRACT | Status: AC
  Administered 2023-01-23: 10 mg via RESPIRATORY_TRACT

## 2023-01-23 MED ORDER — FLUTICASONE FUROATE-VILANTEROL 100-25 MCG/ACT IN AEPB
1.0000 | INHALATION_SPRAY | Freq: Every day | RESPIRATORY_TRACT | Status: DC
  Administered 2023-01-24 – 2023-01-25 (×2): 1 via RESPIRATORY_TRACT
  Filled 2023-01-23: qty 28

## 2023-01-23 MED ORDER — WARFARIN - PHARMACIST DOSING INPATIENT: Freq: Every day | Status: DC

## 2023-01-23 MED ORDER — DM-GUAIFENESIN ER 30-600 MG PO TB12
1.0000 | ORAL_TABLET | Freq: Two times a day (BID) | ORAL | Status: DC
  Administered 2023-01-23 – 2023-01-25 (×5): 1 via ORAL
  Filled 2023-01-23 (×5): qty 1

## 2023-01-23 MED ORDER — DOXYCYCLINE HYCLATE 100 MG PO TABS
100.0000 mg | ORAL_TABLET | Freq: Two times a day (BID) | ORAL | Status: DC
  Administered 2023-01-23: 100 mg via ORAL
  Filled 2023-01-23: qty 1

## 2023-01-23 MED ORDER — ONDANSETRON HCL 4 MG/2ML IJ SOLN
4.0000 mg | Freq: Four times a day (QID) | INTRAMUSCULAR | Status: DC | PRN
  Administered 2023-01-23: 4 mg via INTRAVENOUS
  Filled 2023-01-23: qty 2

## 2023-01-23 MED ORDER — PHENYTOIN SODIUM EXTENDED 100 MG PO CAPS
100.0000 mg | ORAL_CAPSULE | Freq: Every day | ORAL | Status: DC
  Administered 2023-01-24 – 2023-01-25 (×2): 100 mg via ORAL
  Filled 2023-01-23: qty 1

## 2023-01-23 NOTE — Progress Notes (Signed)
ANTICOAGULATION CONSULT NOTE - Initial Consult  Pharmacy Consult for warfarin Indication:  hx DVT  Allergies  Allergen Reactions   Orencia [Abatacept] Anaphylaxis    Had prostate cancer   Carbamazepine Rash    REACTION: makes drowsy BRAND NAME IS TEGRETOL   Celecoxib Itching and Rash    BRAND NAME IS CELEBREX   Cephalexin Rash    "Severe Rash".  BRAND NAME IS KEFLEX.    Enbrel [Etanercept] Rash   Humira [Adalimumab] Rash   Levofloxacin Other (See Comments)    REACTION: GI Intolerance BRAND NAME IS LEVAQUIN   Sulfa Antibiotics Rash   Sulfasalazine Rash    Patient Measurements: Height: 5' 8.5" (174 cm) Weight: 104.3 kg (230 lb) IBW/kg (Calculated) : 69.55 Heparin Dosing Weight:   Vital Signs: Temp: 98.3 F (36.8 C) (01/25 1054) Temp Source: Axillary (01/25 1054) BP: 157/70 (01/25 1105) Pulse Rate: 108 (01/25 1105)  Labs: Recent Labs    01/23/23 0717  HGB 15.2  HCT 44.7  PLT 151  LABPROT 30.9*  INR 3.0*  CREATININE 1.40*    Estimated Creatinine Clearance: 56.3 mL/min (A) (by C-G formula based on SCr of 1.4 mg/dL (H)).   Medical History: Past Medical History:  Diagnosis Date   Arthritis    RA   Asthma    CHF (congestive heart failure) ([place])    pt denies    Clotting disorder (HCC)    DVT both legs    COPD (chronic obstructive pulmonary disease) (HCC)    DDD (degenerative disc disease), cervical    with UE's paresthesias   Diverticulosis    DVT, lower extremity ([place])    bilat   Eye abnormality    right eye drifts has difficulty focusing with right eye has had since birth    GERD (gastroesophageal reflux disease)    Gout    History of measles    History of shingles    Hypercholesterolemia    IBS (irritable bowel syndrome)    IBS (irritable bowel syndrome)    Peripheral edema    Pneumonia    hx of    Prostate cancer ([place])    Rheumatoid arthritis ([place])    Seizures ([place])    last seizure 20 years ago; on dilantin. Unknown origin.   Shortness  of breath dyspnea    exertion    Sleep apnea    cpap    Tubular adenoma of colon 04/2008    Medications:  (Not in a hospital admission)   Assessment: Pharmacy consulted to dose warfarin in patient with history of DVT.  Patient's home dose listed as 3 mg daily with last dose 1/24.  INR on admission is 3.0.   Goal of Therapy:  INR 2-3 Monitor platelets by anticoagulation protocol: Yes   Plan:  Warfarin 2 mg x 1 dose. Monitor daily INR and s/s of bleeding.  Margot Ables, PharmD Clinical Pharmacist 01/23/2023 11:23 AM

## 2023-01-23 NOTE — ED Triage Notes (Signed)
Pt c/o cough, wheezing, shortness of breath and nausea. Pt seen at [facility] last Wednesday and prescribed abx, for concern for pneumonia. Pt concerned the nausea is coming from abx.

## 2023-01-23 NOTE — Progress Notes (Signed)
Notified provider of need to order fluid bolus, pt needs 3129 cc fluid, 3rd lactic acid 4.5.

## 2023-01-23 NOTE — Progress Notes (Signed)
Elink following code sepsis. Notified provider and RN of fluid needs, 3129 cc

## 2023-01-23 NOTE — Progress Notes (Incomplete)
I am Not sure he is septic----lactic acid is high bcos of hypoxia and dehydration...not sepsis----at baseline has HF and is on high dose lasix and aldactone--will avoid excessive fluids at this time----ty

## 2023-01-23 NOTE — ED Provider Notes (Signed)
[facility] Provider Note   CSN: [id number] Arrival date & time: 01/23/23  9379     History  Chief Complaint  Patient presents with   Cough    [name] is a 72 y.o. male.   Cough  This patient is a 72 year old male, history of DVT on warfarin, history of longtime smoking but quit about 20 years ago.  The patient presents with a complaint of coughing shortness of breath and wheezing.  This is been going on for well over 1 week.  He had presented to urgent care last week where he was given doxycycline but he reports that no workup was initiated at that time.  Unfortunately there are no notes from that urgent care visit but the patient states that he did not receive an x-ray and he did not receive any viral testing, he states that after starting the doxycycline he became nauseated and has had difficulty eating and drinking since that time.  He is dry heaving but not vomiting.  He did initially have some diarrhea but that has resolved.  He denies any significant abdominal pain, denies chest pain, noted to be hypoxic on arrival at 90 to 92% on room air.  He does not use breathing treatments at home regularly though review of the medical record shows that he has been prescribed bronchodilators in the past    Home Medications Prior to Admission medications   Medication Sig Start Date End Date Taking? Authorizing Provider  acetaminophen (TYLENOL) 650 MG CR tablet Take 1,300 mg by mouth every 8 (eight) hours as needed for pain.   Yes [provider]  alendronate (FOSAMAX) 70 MG tablet TAKE 1 TABLET BY MOUTH EVERY WEDNESDAY. TAKE WITH A FULL GLASS OF WATER ON AN EMPTY STOMACH. 01/10/23  Yes Ofilia Neas, PA-C  allopurinol (ZYLOPRIM) 100 MG tablet TAKE 1 TABLET BY MOUTH TWICE A DAY 10/21/22  Yes Ofilia Neas, PA-C  ascorbic acid (VITAMIN C) 500 MG tablet Take 500 mg by mouth daily.   Yes [provider]  BREO ELLIPTA 100-25  MCG/ACT AEPB USE 1 INHALATION ORALLY EVERY DAY 12/25/22  Yes Brand Males, MD  cholecalciferol (VITAMIN D3) 25 MCG (1000 UNIT) tablet Take 1,000 Units by mouth daily.   Yes [provider]  diclofenac Sodium (VOLTAREN) 1 % GEL Apply 2-4 grams to affected joint 4 times daily as needed. Patient taking differently: Apply 2-4 grams to affected joint 4 times daily as needed for pain 03/02/20  Yes Ofilia Neas, PA-C  dicyclomine (BENTYL) 10 MG capsule TAKE 1 CAPSULE BY MOUTH 4 TIMES DAILY BEFORE MEALS AND AT BEDTIME Patient taking differently: Take 20 mg by mouth 2 (two) times daily. 08/11/17  Yes Ladene Artist, MD  doxycycline (VIBRAMYCIN) 100 MG capsule Take 100 mg by mouth 2 (two) times daily. 01/15/23  Yes [provider]  folic acid (FOLVITE) 1 MG tablet TAKE 2 TABLETS BY MOUTH EVERY MORNING 06/11/22  Yes Deveshwar, Abel Presto, MD  furosemide (LASIX) 80 MG tablet TAKE 1 TABLET BY MOUTH EVERY DAY 08/19/22  Yes Croitoru, Mihai, MD  INCRUSE ELLIPTA 62.5 MCG/ACT AEPB INHALE 1 PUFF BY MOUTH EVERY DAY 06/25/22  Yes Rigoberto Noel, MD  omeprazole (PRILOSEC) 40 MG capsule Take 40 mg by mouth at bedtime. 09/23/16  Yes [provider]  oxybutynin (DITROPAN-XL) 10 MG 24 hr tablet Take 1 tablet (10 mg total) by mouth daily. 03/15/22  Yes McKenzie, Candee Furbish, MD  phenytoin (DILANTIN) 100 MG ER capsule Take 100-200 mg by mouth See admin instructions. Take one capsule in the morning and 2 capsules at bedtime   Yes [provider]  Potassium Chloride ER 20 MEQ TBCR Take 20 mEq by mouth daily. 02/24/19  Yes [provider]  rosuvastatin (CRESTOR) 10 MG tablet Take 10 mg by mouth daily. 06/14/20  Yes [provider]  spironolactone (ALDACTONE) 50 MG tablet Take 1 tablet (50 mg total) by mouth daily. 07/25/22 07/20/23 Yes Croitoru, Mihai, MD  warfarin (COUMADIN) 3 MG tablet Take 1 tablet (3 mg total) by mouth at bedtime. Restart on 3/24 (48 hours after surgery) 03/22/21  Yes  MacDiarmid, Nicki Reaper, MD  albuterol (PROVENTIL) (2.5 MG/3ML) 0.083% nebulizer solution Take 3 mLs (2.5 mg total) by nebulization every 6 (six) hours as needed for wheezing or shortness of breath. Patient not taking: Reported on 01/23/2023 11/23/19   Elgergawy, Silver Huguenin, MD  Fluticasone-Umeclidin-Vilant (TRELEGY ELLIPTA) 100-62.5-25 MCG/ACT AEPB Inhale 1 puff into the lungs daily. Patient not taking: Reported on 01/23/2023 08/28/22   Rigoberto Noel, MD  leflunomide (ARAVA) 20 MG tablet TAKE 1 TABLET BY MOUTH EVERY DAY Patient not taking: Reported on 01/23/2023 10/21/22   Ofilia Neas, PA-C      Allergies    Orencia [abatacept], Carbamazepine, Celecoxib, Cephalexin, Enbrel [etanercept], Humira [adalimumab], Levofloxacin, Sulfa antibiotics, and Sulfasalazine    Review of Systems   Review of Systems  Respiratory:  Positive for cough.   All other systems reviewed and are negative.   Physical Exam Updated Vital Signs BP (!) 143/76   Pulse (!) 102   Temp 97.9 F (36.6 C) (Oral)   Resp 18   Ht 1.74 m (5' 8.5")   Wt 104.3 kg   SpO2 98%   BMI 34.46 kg/m  Physical Exam Vitals and nursing note reviewed.  Constitutional:      General: He is not in acute distress.    Appearance: He is well-developed.  HENT:     Head: Normocephalic and atraumatic.     Mouth/Throat:     Mouth: Mucous membranes are dry.     Pharynx: No oropharyngeal exudate or posterior oropharyngeal erythema.  Eyes:     General: No scleral icterus.       Right eye: No discharge.        Left eye: No discharge.     Conjunctiva/sclera: Conjunctivae normal.     Pupils: Pupils are equal, round, and reactive to light.  Neck:     Thyroid: No thyromegaly.     Vascular: No JVD.  Cardiovascular:     Rate and Rhythm: Normal rate and regular rhythm.     Heart sounds: Normal heart sounds. No murmur heard.    No friction rub. No gallop.  Pulmonary:     Effort: No respiratory distress.     Breath sounds: Wheezing present. No  rales.     Comments: Mild tachypnea, prolonged expiratory phase, able to speak in full sentences, no accessory muscle use, diffuse expiratory wheezing Abdominal:     General: Bowel sounds are normal. There is no distension.     Palpations: Abdomen is soft. There is no mass.     Tenderness: There is no abdominal tenderness.  Musculoskeletal:        General: No tenderness. Normal range of motion.     Cervical back: Normal range of motion and neck supple.     Right lower leg: No edema.     Left lower  leg: No edema.  Lymphadenopathy:     Cervical: No cervical adenopathy.  Skin:    General: Skin is warm and dry.     Findings: No erythema or rash.  Neurological:     Mental Status: He is alert.     Coordination: Coordination normal.  Psychiatric:        Behavior: Behavior normal.     ED Results / Procedures / Treatments   Labs (all labs ordered are listed, but only abnormal results are displayed) Labs Reviewed  CBC WITH DIFFERENTIAL/PLATELET - Abnormal; Notable for the following components:      Result Value   Monocytes Absolute 1.1 (*)    All other components within normal limits  PROTIME-INR - Abnormal; Notable for the following components:   Prothrombin Time 30.9 (*)    INR 3.0 (*)    All other components within normal limits  COMPREHENSIVE METABOLIC PANEL - Abnormal; Notable for the following components:   Potassium 2.8 (*)    Glucose, Bld 125 (*)    Creatinine, Ser 1.40 (*)    AST 46 (*)    GFR, Estimated 53 (*)    All other components within normal limits  LACTIC ACID, PLASMA - Abnormal; Notable for the following components:   Lactic Acid, Venous 2.2 (*)    All other components within normal limits  LACTIC ACID, PLASMA - Abnormal; Notable for the following components:   Lactic Acid, Venous 6.5 (*)    All other components within normal limits  BLOOD GAS, ARTERIAL - Abnormal; Notable for the following components:   pO2, Arterial 61 (*)    Acid-Base Excess 3.2 (*)     All other components within normal limits  RESP PANEL BY RT-PCR (RSV, FLU A&B, COVID)  RVPGX2  CULTURE, BLOOD (ROUTINE X 2)  CULTURE, BLOOD (ROUTINE X 2)  URINE CULTURE  URINALYSIS, ROUTINE W REFLEX MICROSCOPIC  LACTIC ACID, PLASMA  LACTIC ACID, PLASMA  BRAIN NATRIURETIC PEPTIDE  BRAIN NATRIURETIC PEPTIDE    EKG None  Radiology DG Chest 2 View  Result Date: 01/23/2023 CLINICAL DATA:  72 year old male with cough. Wheezing, shortness of breath and nausea. EXAM: CHEST - 2 VIEW COMPARISON:  Chest CT 06/18/2022 and earlier. FINDINGS: Chronic elevation of the left hemidiaphragm with left lower lobe atelectasis. Lung volumes and mediastinal contours are stable from last year. No superimposed pneumothorax, pulmonary edema, evidence of pleural effusion or new pulmonary opacity. Visualized tracheal air column is within normal limits. No acute osseous abnormality identified. Stable cholecystectomy clips. Negative visible bowel gas. 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 No acute cardiopulmonary abnormality. Electronically Signed   By: Genevie Ann M.D.   On: 01/23/2023 07:17    Procedures .Critical Care  Performed by: Noemi Chapel, MD Authorized by: Noemi Chapel, MD   Critical care provider statement:    Critical care time (minutes):  45   Critical care time was exclusive of:  Separately billable procedures and treating other patients   Critical care was necessary to treat or prevent imminent or life-threatening deterioration of the following conditions:  Respiratory failure   Critical care was time spent personally by me on the following activities:  Development of treatment plan with patient or surrogate, discussions with consultants, evaluation of patient's response to treatment, examination of patient, obtaining history from patient or surrogate, review of old charts, re-evaluation of patient's condition, pulse oximetry, ordering and  review of radiographic studies, ordering and review of laboratory  studies and ordering and performing treatments and interventions   I assumed direction of critical care for this patient from another provider in my specialty: no     Care discussed with: admitting provider   Comments:           Medications Ordered in ED Medications  albuterol (PROVENTIL,VENTOLIN) solution continuous neb (0 mg Nebulization Stopped 01/23/23 0918)  albuterol (PROVENTIL,VENTOLIN) solution continuous neb (10 mg Nebulization New Bag/Given 01/23/23 1017)  lactated ringers bolus 1,000 mL (has no administration in time range)  levofloxacin (LEVAQUIN) IVPB 750 mg (has no administration in time range)  methylPREDNISolone sodium succinate (SOLU-MEDROL) 40 mg/mL injection 40 mg (has no administration in time range)  dextromethorphan-guaiFENesin (MUCINEX DM) 30-600 MG per 12 hr tablet 1 tablet (has no administration in time range)  ipratropium-albuterol (DUONEB) 0.5-2.5 (3) MG/3ML nebulizer solution 3 mL (has no administration in time range)  albuterol (PROVENTIL) (2.5 MG/3ML) 0.083% nebulizer solution 2.5 mg (has no administration in time range)  methylPREDNISolone sodium succinate (SOLU-MEDROL) 125 mg/2 mL injection 125 mg (125 mg Intravenous Given 01/23/23 0741)  ondansetron (ZOFRAN) injection 4 mg (4 mg Intravenous Given 01/23/23 0919)  potassium chloride 10 mEq in 100 mL IVPB (0 mEq Intravenous Stopped 01/23/23 1022)    ED Course/ Medical Decision Making/ A&P                             Medical Decision Making Amount and/or Complexity of Data Reviewed Labs: ordered. Radiology: ordered.  Risk Prescription drug management. Decision regarding hospitalization.   This patient presents to the ED for concern of increasing cough and shortness of breath with wheezing, this involves an extensive number of treatment options, and is a complaint that carries with it a high risk of complications and morbidity.  The  differential diagnosis includes COPD exacerbation, underlying viral illness such as RSV flu or COVID, pneumonia, less likely to be congestive heart failure, there is no JVD or significant peripheral edema   Co morbidities that complicate the patient evaluation  Longtime smoker   Additional history obtained:  Additional history obtained from electronic medical record External records from outside source obtained and reviewed including prior medical record showing that the patient is treated for rheumatoid arthritis with intravenous medications regularly, last receiving this on January 5 just prior to getting ill.  Also with history of COPD followed by pulmonology, Dr. Chase Caller and Dr. Elsworth Soho have seen this patient as recently as August of this last year.  Triple therapy was continued at that visit, known left hemidiaphragm weakness which explains much of his respiratory past, there is no signs of pulmonary fibrosis or interstitial lung disease according to pulmonology from August visit   Lab Tests:  I Ordered, and personally interpreted labs.  The pertinent results include: No leukocytosis, viral panels are negative, the patient is hypokalemic and his initial lactate was 2.2.  A repeat lactate was over 6, this does not seem to match with the presentation so a repeat lactate was ordered and 1 L of fluids.  Because of his ongoing respiratory distress he required workup to evaluate and there was no leukocytosis or significant metabolic abnormalities other than the hypokalemia, lactic acid and blood cultures as well   Imaging Studies ordered:  I ordered imaging studies including chest x-ray I independently visualized and interpreted imaging which showed chronic Bashan of the hemidiaphragm, no other acute findings I agree with the radiologist interpretation   Cardiac Monitoring: /  EKG:  The patient was maintained on a cardiac monitor.  I personally viewed and interpreted the cardiac monitored  which showed an underlying rhythm of: Borderline sinus tachycardia   Consultations Obtained:  I requested consultation with the hospitalist Dr. Roxan Hockey,  and discussed lab and imaging findings as well as pertinent plan - they recommend: Admission to the hospital, higher level of care   Problem List / ED Course / Critical interventions / Medication management  Patient critically ill, multiple nebs, not getting better, added BNP, the patient is having desaturation episodes, will need to have CHF evaluation as well I ordered medication including albuterol steroids antibiotics and IV fluids for severe clinical presentation of respiratory distress, rule out sepsis Reevaluation of the patient after these medicines showed that the patient no significant improvement I have reviewed the patients home medicines and have made adjustments as needed   Social Determinants of Health:  Prior smoker   Test / Admission - Considered:  Admit to higher level of care.  It is not clear whether the patient is septic or not but cultures and a repeat lactate were added.  I do not see a source of infection other than what appears to be a pulmonary symptomatology that could be viral, no obvious bacterial, no leukocytosis.  Dr. Denton Brick will admit to continue to workup and stabilizing care, critical care provided         Final Clinical Impression(s) / ED Diagnoses Final diagnoses:  None    Rx / DC Orders ED Discharge Orders     None         Noemi Chapel, MD 01/23/23 1053

## 2023-01-23 NOTE — H&P (Addendum)
Patient Demographics:    [name], is a 72 y.o. male  MRN: [id number]   DOB - [birth year]  Admit Date - 01/23/2023  Outpatient Primary MD for the patient is [name], MD   Assessment & Plan:   Assessment and Plan:  1) acute COPD exacerbation in the setting of bronchiectasis--- give IV Solu-Medrol, azithromycin mucolytics and bronchodilators -No pneumonia clinically and radiologically -Patient failed outpatient treatment with steroid shot and oral doxycycline given by PCP last week  2) acute hypoxic respiratory failure--- due to #1 above -O2 sats was initially 90% on room air on arrival to the ED however when patient moved around and O2 sats dropped down to 82% on room air -he was given breathing treatments and attempts were made to wean off oxygen however O2 sats dropped back down to 85% on room air he was placed back on oxygen and titrated up to 3 L per nasal cannula -Currently requiring 3 L of oxygen via nasal cannula  3) obesity/OSA--- continue CPAP nightly  4) history of DVT--- continue Coumadin therapy INR currently 3.0  5) seizure disorder--- continue Dilantin, no recent seizures  6) diastolic dysfunction CHF--- hold off on diuretics including Lasix and Aldactone today and may resume in a.m.  7) lactic acidosis--- likely due to hypoxia in the setting of poor oral intake rather than frank sepsis -at baseline has chronic diastolic HF and is on high dose lasix and aldactone--will avoid excessive fluids at this time----    Dispo: The patient is from: Home              Anticipated d/c is to: Home              Anticipated d/c date is: 2 days              Patient currently is not medically stable to d/c. 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 Unknown origin.   Shortness of breath dyspnea    exertion    Sleep apnea    cpap    Tubular adenoma of colon 04/2008      Past Surgical History:  Procedure Laterality Date   CHOLECYSTECTOMY     CIRCUMCISION     COLONOSCOPY     CYSTOSCOPY WITH LITHOLAPAXY N/A 03/17/2019   Procedure: CYSTOSCOPY WITH LITHOLAPAXY AND REMOVAL OF FOREIGN BODY;  Surgeon: [name], MD;  Location: [facility];  Service: Urology;  Laterality: N/A;   DOPPLER ECHOCARDIOGRAPHY N/A 01-21-2012   TECHNICALLY DIFFICULT. MILD CONCENTRIC LV HYPERTROPHY. LV CAVITY IS SMALL.. EF=> 55%. TRANSMITRAL SPECTRAL FLOW PATTREN IS SUGGESTIVE OF IMPAIRED LV RELAXATION. RV SYSTOLIC PRESSURE IS 89QJJH. LEFT ATRIAL SIZE IS NORMAL. AV APPEARS MILDLY SCLEROTIC. NO SIGN VALVE DISEASE NOTED.   LEFT HEART CATH AND CORONARY ANGIOGRAPHY N/A 03/12/2022   Procedure: LEFT HEART CATH AND CORONARY ANGIOGRAPHY;  Surgeon: Sherren Mocha, MD;  Location: [facility];  Service: Cardiovascular;  Laterality: N/A;   LYMPHADENECTOMY Bilateral 08/30/2015   Procedure: PELVIC LYMPHADENECTOMY;  Surgeon: [name], MD;  Location: [facility];  Service: Urology;   Laterality: Bilateral;   NUCLEAR STRESS TEST N/A 01-21-2012   NORMAL PATTERN OF PERFUSION IN ALL REGIONS. POST STRESS LV SIZE IS NORMAL. NO EVIDENCE OF INDUCIBLE ISCHEMIA. EF 54%.   POLYPECTOMY     PROSTATE BIOPSY     ROBOT ASSISTED LAPAROSCOPIC RADICAL PROSTATECTOMY N/A 08/30/2015   Procedure: ROBOTIC ASSISTED LAPAROSCOPIC RADICAL PROSTATECTOMY;  Surgeon: [name], MD;  Location: [facility];  Service: Urology;  Laterality: N/A;   URINARY SPHINCTER IMPLANT N/A 03/20/2021   Procedure: ARTIFICIAL URINARY SPHINCTER CYSTOSCOPY;  Surgeon: Bjorn Loser, MD;  Location: [facility];  Service: Urology;  Laterality: N/A;  REQUESTING 90 MINS   US VENOUS LOWER EXT Right 03/07/11   PERSISTENT DVT IN RIGHT LOWER EXT.WITH PERSISTANT VISUALIZATION OF HYPOECHOIC THROMBUS WITHIN THE FEMORAL, PROFUNDA FEMORAL AND POPLITEAL VEINS. WHEN COMPARED TO PREVIOUS, CLOT IS NO LONGER IDENTIFIED WITH IN THE RIGHT CFV.    Chief Complaint  Patient presents with   Cough      HPI:    [name]  is a 72 y.o. male with past medical history relevant for chronic diastolic dysfunction CHF, obesity/OSA on CPAP, COPD and bronchiectasis, history of DVT chronically on Coumadin therapy, who presented to the ED with cough congestion and shortness of breath with wheezing for the last 10 days or so -He saw his PCP last week when symptoms started he was given a shot of a steroid medication and given doxycycline symptoms have persisted and worsened -Additional history obtained from patient's daughter and wife at bedside -Chills occasional fevers at the beginning of his illness -Cough with nausea----sputum is mostly clear -No frank chest pains no palpitations no dizziness no pleuritic type symptoms no leg pains -Chest x-ray without acute cardiopulmonary findings patient has chronically elevated left hemidiaphragm- -WBC normal -Creatinine WNL -INR 3.0 -Elevated lactic acid noted the setting of hypoxia and decreased oral intake  with persistent nausea -Patient required up to 3 L of oxygen via nasal cannula in the ED -He was tachypneic tachycardic he required multiple breathing treatments -COVID, flu and RSV negative -Patient failed outpatient tr -O2 sats was initially 90% on room air on arrival to the ED however when patient moved around and O2 sats dropped down to 82% on room air -he was given breathing treatments and attempts were made  to wean off oxygen however O2 sats dropped back down to 85% on room air he was placed back on oxygen and titrated up to 3 L per nasal cannulaeatment with steroid shot and oral doxycycline given by PCP last week   Review of systems:    In addition to the HPI above,   A full Review of  Systems was done, all other systems reviewed are negative except as noted above in HPI , .    Social History:  Reviewed by me    Social History   Tobacco Use   Smoking status: Former    Packs/day: 3.00    Years: 20.00    Total pack years: 60.00    Types: Cigarettes    Quit date: 12/30/1996    Years since quitting: 26.0    Passive exposure: Never   Smokeless tobacco: Never  Substance Use Topics   Alcohol use: No    Alcohol/week: 0.0 standard drinks of alcohol       Family History :  Reviewed by me    Family History  Problem Relation Age of Onset   Skin cancer Father    Stomach cancer Father    Heart attack Father    Alzheimer's disease Mother    Throat cancer Brother    Stomach cancer Brother    Lung cancer Brother    Heart attack Brother    Heart attack Brother    Breast cancer Sister    Heart attack Sister    Stroke Sister    Bladder Cancer Sister    Heart murmur Sister    Colon cancer Brother    Colon polyps Neg Hx       Home Medications:   Prior to Admission medications   Medication Sig Start Date End Date Taking? Authorizing Provider  acetaminophen (TYLENOL) 650 MG CR tablet Take 1,300 mg by mouth every 8 (eight) hours as needed for pain.   Yes [provider]  alendronate (FOSAMAX) 70 MG tablet TAKE 1 TABLET BY MOUTH EVERY WEDNESDAY. TAKE WITH A FULL GLASS OF WATER ON AN EMPTY STOMACH. 01/10/23  Yes Ofilia Neas, PA-C  allopurinol (ZYLOPRIM) 100 MG tablet TAKE 1 TABLET BY MOUTH TWICE A DAY 10/21/22  Yes Ofilia Neas, PA-C  ascorbic acid (VITAMIN C) 500 MG tablet Take 500 mg by mouth daily.   Yes [provider]  BREO ELLIPTA 100-25 MCG/ACT AEPB USE 1 INHALATION ORALLY EVERY DAY 12/25/22  Yes Brand Males, MD  cholecalciferol (VITAMIN D3) 25 MCG (1000 UNIT) tablet Take 1,000 Units by mouth daily.   Yes [provider]  diclofenac Sodium (VOLTAREN) 1 % GEL Apply 2-4 grams to affected joint 4 times daily as needed. Patient taking differently: Apply 2-4 grams to affected joint 4 times daily as needed for pain 03/02/20  Yes Ofilia Neas, PA-C  dicyclomine (BENTYL) 10 MG capsule TAKE 1 CAPSULE BY MOUTH 4 TIMES DAILY BEFORE MEALS AND AT BEDTIME Patient taking differently: Take 20 mg by mouth 2 (two) times daily. 08/11/17  Yes Ladene Artist, MD  doxycycline (VIBRAMYCIN) 100 MG capsule Take 100 mg by mouth 2 (two) times daily. 01/15/23  Yes [provider]  folic acid (FOLVITE) 1 MG tablet TAKE 2 TABLETS BY MOUTH EVERY MORNING 06/11/22  Yes Deveshwar, Abel Presto, MD  furosemide (LASIX) 80 MG tablet TAKE 1 TABLET BY MOUTH EVERY DAY 08/19/22  Yes Croitoru, Mihai, MD  INCRUSE ELLIPTA 62.5 MCG/ACT AEPB INHALE 1 PUFF BY MOUTH EVERY DAY  06/25/22  Yes Rigoberto Noel, MD  omeprazole (PRILOSEC) 40 MG capsule Take 40 mg by mouth at bedtime. 09/23/16  Yes [provider]  oxybutynin (DITROPAN-XL) 10 MG 24 hr tablet Take 1 tablet (10 mg total) by mouth daily. 03/15/22  Yes McKenzie, Candee Furbish, MD  phenytoin (DILANTIN) 100 MG ER capsule Take 100-200 mg by mouth See admin instructions. Take one capsule in the morning and 2 capsules at bedtime   Yes [provider]  Potassium Chloride ER 20 MEQ TBCR Take 20 mEq by  mouth daily. 02/24/19  Yes [provider]  rosuvastatin (CRESTOR) 10 MG tablet Take 10 mg by mouth daily. 06/14/20  Yes [provider]  spironolactone (ALDACTONE) 50 MG tablet Take 1 tablet (50 mg total) by mouth daily. 07/25/22 07/20/23 Yes Croitoru, Mihai, MD  warfarin (COUMADIN) 3 MG tablet Take 1 tablet (3 mg total) by mouth at bedtime. Restart on 3/24 (48 hours after surgery) 03/22/21  Yes MacDiarmid, Nicki Reaper, MD  albuterol (PROVENTIL) (2.5 MG/3ML) 0.083% nebulizer solution Take 3 mLs (2.5 mg total) by nebulization every 6 (six) hours as needed for wheezing or shortness of breath. Patient not taking: Reported on 01/23/2023 11/23/19   Elgergawy, Silver Huguenin, MD  Fluticasone-Umeclidin-Vilant (TRELEGY ELLIPTA) 100-62.5-25 MCG/ACT AEPB Inhale 1 puff into the lungs daily. Patient not taking: Reported on 01/23/2023 08/28/22   Rigoberto Noel, MD  leflunomide (ARAVA) 20 MG tablet TAKE 1 TABLET BY MOUTH EVERY DAY Patient not taking: Reported on 01/23/2023 10/21/22   Ofilia Neas, PA-C     Allergies:     Allergies  Allergen Reactions   Orencia [Abatacept] Anaphylaxis    Had prostate cancer   Carbamazepine Rash    REACTION: makes drowsy BRAND NAME IS TEGRETOL   Celecoxib Itching and Rash    BRAND NAME IS CELEBREX   Cephalexin Rash    "Severe Rash".  BRAND NAME IS KEFLEX.    Enbrel [Etanercept] Rash   Humira [Adalimumab] Rash   Levofloxacin Other (See Comments)    REACTION: GI Intolerance BRAND NAME IS LEVAQUIN   Sulfa Antibiotics Rash   Sulfasalazine Rash     Physical Exam:   Vitals  Blood pressure (!) 140/60, pulse 97, temperature 98.6 F (37 C), temperature source Oral, resp. rate 20, height 5' 8.5" (1.74 m), weight 100.3 kg, SpO2 94 %.  Physical Examination: General appearance - alert, conversational dyspnea  Mental status - alert, oriented to person, place, and time,  Eyes - sclera anicteric Nose- [place] 3L/min Neck - supple, no JVD elevation , Chest -diminished  breath sounds with scattered wheezes bilaterally  heart - S1 and S2 normal, regular  Abdomen - soft, nontender, nondistended, +BS Neurological - screening mental status exam normal, neck supple without rigidity, cranial nerves II through XII intact, DTR's normal and symmetric Extremities -chronic venous stasis, intact peripheral pulses  Skin - warm, dry     Data Review:    CBC Recent Labs  Lab 01/23/23 0717  WBC 7.3  HGB 15.2  HCT 44.7  PLT 151  MCV 87.8  MCH 29.9  MCHC 34.0  RDW 13.5  LYMPHSABS 1.1  MONOABS 1.1*  EOSABS 0.2  BASOSABS 0.1   ------------------------------------------------------------------------------------------------------------------  Chemistries  Recent Labs  Lab 01/23/23 0717 01/23/23 0745  NA 139  --   K 2.8*  --   CL 98  --   CO2 27  --   GLUCOSE 125*  --   BUN 21  --   CREATININE 1.40*  --  CALCIUM 9.5  --   MG  --  1.5*  AST 46*  --   ALT 29  --   ALKPHOS 62  --   BILITOT 1.1  --    ------------------------------------------------------------------------------------------------------------------ estimated creatinine clearance is 55.3 mL/min (A) (by C-G formula based on SCr of 1.4 mg/dL (H)). ------------------------------------------------------------------------------------------------------------------ No results for input(s): "TSH", "T4TOTAL", "T3FREE", "THYROIDAB" in the last 72 hours.  Invalid input(s): "FREET3"   Coagulation profile Recent Labs  Lab 01/23/23 0717  INR 3.0*   ------------------------------------------------------------------------------------------------------------------- No results for input(s): "DDIMER" in the last 72 hours. -------------------------------------------------------------------------------------------------------------------  Cardiac Enzymes No results for input(s): "CKMB", "TROPONINI", "MYOGLOBIN" in the last 168 hours.  Invalid input(s):  "CK" ------------------------------------------------------------------------------------------------------------------    Component Value Date/Time   BNP 21.0 01/23/2023 0745     ---------------------------------------------------------------------------------------------------------------  Urinalysis    Component Value Date/Time   COLORURINE YELLOW 01/23/2023 1131   APPEARANCEUR CLEAR 01/23/2023 1131   APPEARANCEUR Clear 03/15/2022 1509   LABSPEC 1.014 01/23/2023 1131   PHURINE 5.0 01/23/2023 1131   GLUCOSEU NEGATIVE 01/23/2023 1131   HGBUR NEGATIVE 01/23/2023 1131   BILIRUBINUR NEGATIVE 01/23/2023 1131   BILIRUBINUR Negative 03/15/2022 1509   KETONESUR NEGATIVE 01/23/2023 1131   PROTEINUR NEGATIVE 01/23/2023 1131   UROBILINOGEN 0.2 09/08/2020 1326   UROBILINOGEN 0.2 09/24/2012 1640   NITRITE NEGATIVE 01/23/2023 1131   LEUKOCYTESUR NEGATIVE 01/23/2023 1131    ----------------------------------------------------------------------------------------------------------------   Imaging Results:    DG Chest 2 View  Result Date: 01/23/2023 CLINICAL DATA:  72 year old male with cough. Wheezing, shortness of breath and nausea. EXAM: CHEST - 2 VIEW COMPARISON:  Chest CT 06/18/2022 and earlier. FINDINGS: Chronic elevation of the left hemidiaphragm with left lower lobe atelectasis. Lung volumes and mediastinal contours are stable from last year. No superimposed pneumothorax, pulmonary edema, evidence of pleural effusion or new pulmonary opacity. Visualized tracheal air column is within normal limits. No acute osseous abnormality identified. Stable cholecystectomy clips. Negative visible bowel gas. 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 No acute cardiopulmonary abnormality. Electronically Signed   By: Genevie Ann M.D.   On: 01/23/2023 07:17    Radiological Exams on Admission: DG Chest 2 View  Result Date: 01/23/2023 CLINICAL  DATA:  72 year old male with cough. Wheezing, shortness of breath and nausea. EXAM: CHEST - 2 VIEW COMPARISON:  Chest CT 06/18/2022 and earlier. FINDINGS: Chronic elevation of the left hemidiaphragm with left lower lobe atelectasis. Lung volumes and mediastinal contours are stable from last year. No superimposed pneumothorax, pulmonary edema, evidence of pleural effusion or new pulmonary opacity. Visualized tracheal air column is within normal limits. No acute osseous abnormality identified. Stable cholecystectomy clips. Negative visible bowel gas. 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 No acute cardiopulmonary abnormality. Electronically Signed   By: Genevie Ann M.D.   On: 01/23/2023 07:17    DVT Prophylaxis -SCD/coumadin AM Labs Ordered, also please review Full Orders  Family Communication: Admission, patients condition and plan of care including tests being ordered have been discussed with the patient and wife, daughter at bedside who indicate understanding and agree with the plan   Condition  -stable  Roxan Hockey M.D on 01/23/2023 at 7:09 PM Go to [email or web address] -  for contact info  [facility] - Office  [phone]

## 2023-01-23 NOTE — Progress Notes (Signed)
Per MD at baseline pt has heart failure so fluid volume for sepsis not given

## 2023-01-23 NOTE — Progress Notes (Signed)
   01/23/23 1147  Assess: MEWS Score  Temp 98.2 F (36.8 C)  BP (!) 152/72  Pulse Rate (!) 111  Resp (!) 22  SpO2 97 %  O2 Device Nasal Cannula  Assess: MEWS Score  MEWS Temp 0  MEWS Systolic 0  MEWS Pulse 2  MEWS RR 1  MEWS LOC 0  MEWS Score 3  MEWS Score Color Yellow  Assess: if the MEWS score is Yellow or Red  Were vital signs taken at a resting state? No  Focused Assessment No change from prior assessment  Does the patient meet 2 or more of the SIRS criteria? Yes  Does the patient have a confirmed or suspected source of infection? Yes  Provider and Rapid Response Notified? Yes  MEWS guidelines implemented *See Row Information* No, previously yellow, continue vital signs every 4 hours  Treat  MEWS Interventions Other (Comment) (Charge aware, patient just transferred up from ED)  Pain Scale 0-10  Pain Score 0  Take Vital Signs  Increase Vital Sign Frequency  Yellow: Q 2hr X 2 then Q 4hr X 2, if remains yellow, continue Q 4hrs  Escalate  MEWS: Escalate Yellow: discuss with charge nurse/RN and consider discussing with provider and RRT  Notify: Charge Nurse/RN  Name of Charge Nurse/RN Notified Audrea Muscat RN  Date Charge Nurse/RN Notified 01/23/23  Time Charge Nurse/RN Notified 1150  Provider Notification  Provider Name/Title [facility]  Date Provider Notified 01/23/23  Time Provider Notified 1200  Method of Notification Face-to-face  Notification Reason Other (Comment) (Yellow [place])  Provider response No new orders  Date of Provider Response 01/23/23  Time of Provider Response 1200  Document  Progress note created (see row info) Yes  Assess: SIRS CRITERIA  SIRS Temperature  0  SIRS Pulse 1  SIRS Respirations  1  SIRS WBC 0  SIRS Score Sum  2

## 2023-01-24 DIAGNOSIS — J441 Chronic obstructive pulmonary disease with (acute) exacerbation: Secondary | ICD-10-CM | POA: Diagnosis not present

## 2023-01-24 LAB — BASIC METABOLIC PANEL
Anion gap: 10 (ref 5–15)
BUN: 30 mg/dL — ABNORMAL HIGH (ref 8–23)
CO2: 27 mmol/L (ref 22–32)
Calcium: 9.1 mg/dL (ref 8.9–10.3)
Chloride: 100 mmol/L (ref 98–111)
Creatinine, Ser: 1.49 mg/dL — ABNORMAL HIGH (ref 0.61–1.24)
GFR, Estimated: 50 mL/min — ABNORMAL LOW (ref >60)
Glucose, Bld: 134 mg/dL — ABNORMAL HIGH (ref 70–99)
Potassium: 4.1 mmol/L (ref 3.5–5.1)
Sodium: 137 mmol/L (ref 135–145)

## 2023-01-24 LAB — URINE CULTURE: Culture: NO GROWTH

## 2023-01-24 LAB — PROTIME-INR
INR: 3.6 — ABNORMAL HIGH (ref 0.8–1.2)
Prothrombin Time: 35.4 seconds — ABNORMAL HIGH (ref 11.4–15.2)

## 2023-01-24 NOTE — Progress Notes (Signed)
PROGRESS NOTE     [name], is a 72 y.o. male, DOB - [birth year], VZD:[id number]  Admit date - 01/23/2023   Admitting Physician Javarie Crisp Denton Brick, MD  Outpatient Primary MD for the patient is [name], MD  LOS - 1  Chief Complaint  Patient presents with   Cough        Brief Narrative:  72 y.o. male with past medical history relevant for chronic diastolic dysfunction CHF, obesity/OSA on CPAP, COPD and bronchiectasis, history of DVT chronically on Coumadin therapy admitted on 01/23/2023 with acute hypoxic respiratory failure secondary to acute COPD exacerbation after failing outpatient management by PCP    -Assessment and Plan: 1) acute COPD exacerbation in the setting of bronchiectasis--- give IV Solu-Medrol, azithromycin mucolytics and bronchodilators -No pneumonia clinically and radiologically -Cough dyspnea and respiratory symptoms improving slowly with above measures -Unable to wean off oxygen at this time   2) acute hypoxic respiratory failure--- due to #1 above -Hypoxia improving but unable to wean off oxygen at this time- -O2 sats dropped down to 87% with minimal activity--walking to the bathroom Placed back on 2 L of oxygen via nasal cannula   3) obesity/OSA--- continue CPAP nightly   4) history of DVT--- continue Coumadin therapy -Pharmacy monitoring INR   5) seizure disorder--- continue Dilantin, no recent seizures   6) diastolic dysfunction CHF--- hold off on diuretics including Lasix and Aldactone today and may resume in a.m.   7) lactic acidosis--- likely due to hypoxia in the setting of poor oral intake rather than frank sepsis -at baseline has chronic diastolic HF and is on high dose lasix and aldactone--will avoid excessive fluids at this time----      Dispo: The patient is from: Home              Anticipated d/c is to: Home              Anticipated d/c date is: 2 days              Patient currently is not medically stable to d/c. Barriers: Not  Clinically Stable-  Code Status :  -  Code Status: Full Code   Family Communication:    NA (patient is alert, awake and coherent)   DVT Prophylaxis  :   Coumadin,- SCDs  SCDs Start: 01/23/23 1825 Place TED hose Start: 01/23/23 1825   Lab Results  Component Value Date   PLT 151 01/23/2023    Inpatient Medications  Scheduled Meds:  allopurinol  100 mg Oral BID   azithromycin  500 mg Oral Daily   chlorpheniramine-HYDROcodone  10 mL Oral Q12H   dextromethorphan-guaiFENesin  1 tablet Oral BID   fluticasone furoate-vilanterol  1 puff Inhalation Daily   folic acid  2 mg Oral q morning   furosemide  80 mg Oral Daily   ipratropium-albuterol  3 mL Nebulization Q6H   methylPREDNISolone (SOLU-MEDROL) injection  40 mg Intravenous Q12H   pantoprazole  40 mg Oral Daily   phenytoin  100 mg Oral Daily   phenytoin  200 mg Oral QHS   rosuvastatin  10 mg Oral Daily   sodium chloride flush  3 mL Intravenous Q12H   sodium chloride flush  3 mL Intravenous Q12H   spironolactone  50 mg Oral Daily   umeclidinium bromide  1 puff Inhalation Daily   Warfarin - Pharmacist Dosing Inpatient   Does not apply q1600   Continuous Infusions:  sodium chloride     PRN Meds:.sodium chloride,  acetaminophen **OR** acetaminophen, albuterol, bisacodyl, ondansetron **OR** ondansetron (ZOFRAN) IV, polyethylene glycol, sodium chloride flush, traZODone   Anti-infectives (From admission, onward)    Start     Dose/Rate Route Frequency Ordered Stop   01/23/23 1945  azithromycin (ZITHROMAX) tablet 500 mg        500 mg Oral Daily 01/23/23 1854     01/23/23 1100  doxycycline (VIBRA-TABS) tablet 100 mg  Status:  Discontinued        100 mg Oral Every 12 hours 01/23/23 1053 01/23/23 1854   01/23/23 1045  levofloxacin (LEVAQUIN) IVPB 750 mg  Status:  Discontinued        750 mg 100 mL/hr over 90 Minutes Intravenous  Once 01/23/23 1038 01/23/23 1053         Subjective: [name] today has no fevers, no emesis,   No chest pain,   - Cough, wheezing, dyspnea improving slowly -Attempted to wean patient off oxygen -O2 sats dropped back to 87% while ambulating to bathroom patient was placed back on 2 L of oxygen via nasal cannula  No Nausea, Vomiting or Diarrhea  Objective: Vitals:   01/24/23 0730 01/24/23 0800 01/24/23 1223 01/24/23 1320  BP:   (!) 150/77   Pulse:   84   Resp:   20   Temp:   98.1 F (36.7 C)   TempSrc:   Oral   SpO2: 96% 98% 95% 96%  Weight:      Height:        Intake/Output Summary (Last 24 hours) at 01/24/2023 1936 Last data filed at 01/24/2023 1500 Gross per 24 hour  Intake 720 ml  Output 400 ml  Net 320 ml   Filed Weights   01/23/23 0637 01/23/23 1147  Weight: 104.3 kg 100.3 kg    Physical Exam  Gen:- Awake Alert,  in no apparent distress  HEENT:- [place].AT, No sclera icterus Nose- [place] 2L/min Neck-Supple Neck,No JVD,.  Lungs-air movement improving, few scattered wheezes CV- S1, S2 normal, regular  Abd-  +ve B.Sounds, Abd Soft, No tenderness,    Extremity/Skin:-Chronic venous stasis,, pedal pulses present  Psych-affect is appropriate, oriented x3 Neuro-no new focal deficits, no tremors  Data Reviewed: I have personally reviewed following labs and imaging studies  CBC: Recent Labs  Lab 01/23/23 0717  WBC 7.3  NEUTROABS 4.8  HGB 15.2  HCT 44.7  MCV 87.8  PLT 569   Basic Metabolic Panel: Recent Labs  Lab 01/23/23 0717 01/23/23 0745 01/24/23 0421  NA 139  --  137  K 2.8*  --  4.1  CL 98  --  100  CO2 27  --  27  GLUCOSE 125*  --  134*  BUN 21  --  30*  CREATININE 1.40*  --  1.49*  CALCIUM 9.5  --  9.1  MG  --  1.5*  --    GFR: Estimated Creatinine Clearance: 51.9 mL/min (A) (by C-G formula based on SCr of 1.49 mg/dL (H)). Liver Function Tests: Recent Labs  Lab 01/23/23 0717  AST 46*  ALT 29  ALKPHOS 62  BILITOT 1.1  PROT 6.8  ALBUMIN 4.0    Recent Results (from the past 240 hour(s))  Resp panel by RT-PCR (RSV, Flu A&B, Covid)  Anterior Nasal Swab     Status: None   Collection Time: 01/23/23  6:40 AM   Specimen: Anterior Nasal Swab  Result Value Ref Range Status   SARS Coronavirus 2 by RT PCR NEGATIVE NEGATIVE Final    Comment: (  NOTE) SARS-CoV-2 target nucleic acids are NOT DETECTED.  The SARS-CoV-2 RNA is generally detectable in upper respiratory specimens during the acute phase of infection. The lowest concentration of SARS-CoV-2 viral copies this assay can detect is 138 copies/mL. A negative result does not preclude SARS-Cov-2 infection and should not be used as the sole basis for treatment or other patient management decisions. A negative result may occur with  improper specimen collection/handling, submission of specimen other than nasopharyngeal swab, presence of viral mutation(s) within the areas targeted by this assay, and inadequate number of viral copies(<138 copies/mL). A negative result must be combined with clinical observations, patient history, and epidemiological information. The expected result is Negative.  Fact Sheet for Patients:  EntrepreneurPulse.com.au  Fact Sheet for Healthcare Providers:  IncredibleEmployment.be  This test is no t yet approved or cleared by the [place] FDA and  has been authorized for detection and/or diagnosis of SARS-CoV-2 by FDA under an Emergency Use Authorization (EUA). This EUA will remain  in effect (meaning this test can be used) for the duration of the COVID-19 declaration under Section 564(b)(1) of the Act, 21 U.S.C.section 360bbb-3(b)(1), unless the authorization is terminated  or revoked sooner.       Influenza A by PCR NEGATIVE NEGATIVE Final   Influenza B by PCR NEGATIVE NEGATIVE Final    Comment: (NOTE) The Xpert Xpress SARS-CoV-2/FLU/RSV plus assay is intended as an aid in the diagnosis of influenza from Nasopharyngeal swab specimens and should not be used as a sole basis for treatment. Nasal washings  and aspirates are unacceptable for Xpert Xpress SARS-CoV-2/FLU/RSV testing.  Fact Sheet for Patients: EntrepreneurPulse.com.au  Fact Sheet for Healthcare Providers: IncredibleEmployment.be  This test is not yet approved or cleared by the [place] FDA and has been authorized for detection and/or diagnosis of SARS-CoV-2 by FDA under an Emergency Use Authorization (EUA). This EUA will remain in effect (meaning this test can be used) for the duration of the COVID-19 declaration under Section 564(b)(1) of the Act, 21 U.S.C. section 360bbb-3(b)(1), unless the authorization is terminated or revoked.     Resp Syncytial Virus by PCR NEGATIVE NEGATIVE Final    Comment: (NOTE) Fact Sheet for Patients: EntrepreneurPulse.com.au  Fact Sheet for Healthcare Providers: IncredibleEmployment.be  This test is not yet approved or cleared by the [place] FDA and has been authorized for detection and/or diagnosis of SARS-CoV-2 by FDA under an Emergency Use Authorization (EUA). This EUA will remain in effect (meaning this test can be used) for the duration of the COVID-19 declaration under Section 564(b)(1) of the Act, 21 U.S.C. section 360bbb-3(b)(1), unless the authorization is terminated or revoked.  Performed at [facility], [address], [place], [place] [address]   Blood Culture (routine x 2)     Status: None (Preliminary result)   Collection Time: 01/23/23 10:38 AM   Specimen: BLOOD RIGHT FOREARM  Result Value Ref Range Status   Specimen Description BLOOD RIGHT FOREARM  Final   Special Requests   Final    BOTTLES DRAWN AEROBIC AND ANAEROBIC Blood Culture adequate volume   Culture   Final    NO GROWTH < 24 HOURS Performed at [facility], [address] [place], [place] [address]    Report Status PENDING  Incomplete  Blood Culture (routine x 2)     Status: None (Preliminary result)   Collection Time:  01/23/23 10:43 AM   Specimen: BLOOD LEFT HAND  Result Value Ref Range Status   Specimen Description BLOOD LEFT HAND  Final   Special Requests   Final    BOTTLES DRAWN AEROBIC AND ANAEROBIC Blood Culture results may not be optimal due to an excessive volume of blood received in culture bottles   Culture   Final    NO GROWTH < 24 HOURS Performed at [facility], [address] [place], [place] [address]    Report Status PENDING  Incomplete  Urine Culture (for pregnant, neutropenic or urologic patients or patients with an indwelling urinary catheter)     Status: None   Collection Time: 01/23/23 11:31 AM   Specimen: Urine, Clean Catch  Result Value Ref Range Status   Specimen Description   Final    URINE, CLEAN CATCH Performed at [facility], [address], [place], [place] [address]    Special Requests   Final    NONE Performed at [facility], [address], [place],  [address]    Culture   Final    NO GROWTH Performed at [facility], [place] [address], [place],  [address]    Report Status 01/24/2023 FINAL  Final      Radiology Studies: DG Chest 2 View  Result Date: 01/23/2023 CLINICAL DATA:  72 year old male with cough. Wheezing, shortness of breath and nausea. EXAM: CHEST - 2 VIEW COMPARISON:  Chest CT 06/18/2022 and earlier. FINDINGS: Chronic elevation of the left hemidiaphragm with left lower lobe atelectasis. Lung volumes and mediastinal contours are stable from last year. No superimposed pneumothorax, pulmonary edema, evidence of pleural effusion or new pulmonary opacity. Visualized tracheal air column is within normal limits. No acute osseous abnormality identified. Stable cholecystectomy clips. Negative visible bowel gas. 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 No acute cardiopulmonary abnormality. Electronically Signed   By: Genevie Ann M.D.   On: 01/23/2023 07:17     Scheduled Meds:   allopurinol  100 mg Oral BID   azithromycin  500 mg Oral Daily   chlorpheniramine-HYDROcodone  10 mL Oral Q12H   dextromethorphan-guaiFENesin  1 tablet Oral BID   fluticasone furoate-vilanterol  1 puff Inhalation Daily   folic acid  2 mg Oral q morning   furosemide  80 mg Oral Daily   ipratropium-albuterol  3 mL Nebulization Q6H   methylPREDNISolone (SOLU-MEDROL) injection  40 mg Intravenous Q12H   pantoprazole  40 mg Oral Daily   phenytoin  100 mg Oral Daily   phenytoin  200 mg Oral QHS   rosuvastatin  10 mg Oral Daily   sodium chloride flush  3 mL Intravenous Q12H   sodium chloride flush  3 mL Intravenous Q12H   spironolactone  50 mg Oral Daily   umeclidinium bromide  1 puff Inhalation Daily   Warfarin - Pharmacist Dosing Inpatient   Does not apply q1600   Continuous Infusions:  sodium chloride       LOS: 1 day    Roxan Hockey M.D on 01/24/2023 at 7:36 PM  Go to [email or web address] - for contact info  [facility] - Office  [phone]  If 7PM-7AM, please contact night-coverage [email or web address] 01/24/2023, 7:36 PM

## 2023-01-24 NOTE — Progress Notes (Signed)
[facility] for warfarin Indication:  hx DVT  Allergies  Allergen Reactions   Orencia [Abatacept] Anaphylaxis    Had prostate cancer   Carbamazepine Rash    REACTION: makes drowsy BRAND NAME IS TEGRETOL   Celecoxib Itching and Rash    BRAND NAME IS CELEBREX   Cephalexin Rash    "Severe Rash".  BRAND NAME IS KEFLEX.    Enbrel [Etanercept] Rash   Humira [Adalimumab] Rash   Levofloxacin Other (See Comments)    REACTION: GI Intolerance BRAND NAME IS LEVAQUIN   Sulfa Antibiotics Rash   Sulfasalazine Rash    Patient Measurements: Height: 5' 8.5" (174 cm) Weight: 100.3 kg (221 lb 1.9 oz) IBW/kg (Calculated) : 69.55 Heparin Dosing Weight:   Vital Signs: Temp: 98.2 F (36.8 C) (01/26 0425) BP: 148/71 (01/26 0425) Pulse Rate: 85 (01/26 0425)  Labs: Recent Labs    01/23/23 0717 01/24/23 0421  HGB 15.2  --   HCT 44.7  --   PLT 151  --   LABPROT 30.9* 35.4*  INR 3.0* 3.6*  CREATININE 1.40* 1.49*     Estimated Creatinine Clearance: 51.9 mL/min (A) (by C-G formula based on SCr of 1.49 mg/dL (H)).   Medical History: Past Medical History:  Diagnosis Date   Arthritis    RA   Asthma    CHF (congestive heart failure) ([place])    pt denies    Clotting disorder (HCC)    DVT both legs    COPD (chronic obstructive pulmonary disease) (HCC)    DDD (degenerative disc disease), cervical    with UE's paresthesias   Diverticulosis    DVT, lower extremity ([place])    bilat   Eye abnormality    right eye drifts has difficulty focusing with right eye has had since birth    GERD (gastroesophageal reflux disease)    Gout    History of measles    History of shingles    Hypercholesterolemia    IBS (irritable bowel syndrome)    IBS (irritable bowel syndrome)    Peripheral edema    Pneumonia    hx of    Prostate cancer ([place])    Rheumatoid arthritis ([place])    Seizures ([place])    last seizure 20 years ago; on dilantin. Unknown origin.    Shortness of breath dyspnea    exertion    Sleep apnea    cpap    Tubular adenoma of colon 04/2008   Assessment: Pharmacy consulted to dose warfarin in patient with history of DVT.  Patient's home dose listed as 3 mg daily with last dose 1/24.  INR on admission is 3.0.  INR trended up from 3>3.6 this am after reduced dose given last night. Patient also receiving antibiotics which can increase sensitivity. No cbc but was within normal limits on 1/25. No bleeding noted.   Goal of Therapy:  INR 2-3 Monitor platelets by anticoagulation protocol: Yes   Plan:  Will hold dose today and allow INR to trend down within normal limits prior to redosing Monitor daily INR and s/s of bleeding.  Erin Hearing PharmD., BCPS Clinical Pharmacist 01/24/2023 8:31 AM

## 2023-01-24 NOTE — Plan of Care (Signed)
  Problem: Health Behavior/Discharge Planning: Goal: Ability to manage health-related needs will improve Outcome: Progressing

## 2023-01-24 NOTE — Progress Notes (Addendum)
Patient placed on home unit CPAP and tolerating well at this time. 3L oxygen added to CPAP due to decreased o2 sats 85%.  O2 sats now 93%

## 2023-01-24 NOTE — Care Management Important Message (Signed)
Important Message  Patient Details  Name: [name] MRN: [id number] Date of Birth: 06-26-1951   Medicare Important Message Given:  Yes     Tommy Medal 01/24/2023, 11:28 AM

## 2023-01-24 NOTE — Plan of Care (Signed)
  Problem: Education: Goal: Knowledge of General Education information will improve Description: Including pain rating scale, medication(s)/side effects and non-pharmacologic comfort measures Outcome: Progressing   Problem: Health Behavior/Discharge Planning: Goal: Ability to manage health-related needs will improve Outcome: Progressing

## 2023-01-24 NOTE — TOC Initial Note (Signed)
Transition of Care [facility]) - Initial/Assessment Note    Patient Details  Name: [name] MRN: [id number] Date of Birth: [birth year]  Transition of Care [facility]) CM/SW Contact:    Salome Arnt, [place] Phone Number: 01/24/2023, 8:17 AM  Clinical Narrative: Pt admitted with acute COPD exacerbation. Assessment completed with pt due to high risk readmission score. Pt reports he lives with his wife and is independent with ADLs. He has transportation to appointments. Pt plans to return home when medically stable. He is currently requiring O2. TOC will monitor to set up home O2 if needed at d/c.                   Expected Discharge Plan: Home/Self Care Barriers to Discharge: Continued Medical Work up   Patient Goals and CMS Choice Patient states their goals for this hospitalization and ongoing recovery are:: return home   Choice offered to / list presented to : Patient [place] ownership interest in [facility].provided to::  (n/a)    Expected Discharge Plan and Services In-house Referral: Clinical Social Work     Living arrangements for the past 2 months: [place]                                      Prior Living Arrangements/Services Living arrangements for the past 2 months: [place] Lives with:: Spouse Patient language and need for interpreter reviewed:: Yes Do you feel safe going back to the place where you live?: Yes      Need for Family Participation in Patient Care: No (Comment)     Criminal Activity/Legal Involvement Pertinent to Current Situation/Hospitalization: No - Comment as needed  Activities of Daily Living      Permission Sought/Granted                  Emotional Assessment     Affect (typically observed): Appropriate Orientation: : Oriented to Self, Oriented to Place, Oriented to  Time, Oriented to Situation Alcohol / Substance Use: Not Applicable Psych Involvement: No (comment)  Admission  diagnosis:  Hypokalemia [E87.6] Acute respiratory failure with hypoxia ([place]) [J96.01] COPD with acute exacerbation ([place]) [J44.1] Patient Active Problem List   Diagnosis Date Noted   Acute respiratory failure with hypoxia ([place]) 01/23/2023   Diaphragm paralysis 08/28/2022   Chest pain 03/11/2022   Stage 3a chronic kidney disease (CKD) ([place]) 03/11/2022   Bronchiectasis without complication ([place]) 67/61/9509   Incontinence when straining, male 03/20/2021   Erectile dysfunction after radical prostatectomy 09/08/2020   OAB (overactive bladder) 02/23/2020   Class 2 severe obesity due to excess calories with serious comorbidity and body mass index (BMI) of 35.0 to 35.9 in adult [facility])    Chronic diastolic HF (heart failure) ([place])    Serratia sepsis ([place]) 06/08/2019   Voice hoarseness 04/26/2019   OSA (obstructive sleep apnea) 03/10/2019   Abnormal CT of the chest 02/09/2019   Dyspnea on exertion 10/22/2017   History of seizure disorder 02/27/2017   Primary osteoarthritis of both hands 02/27/2017   Primary osteoarthritis of both feet 02/27/2017   Idiopathic gout of multiple sites 02/27/2017   High risk medication use 12/29/2016   History of prostate cancer 12/29/2016   Primary osteoarthritis of both knees 12/29/2016   Chronic idiopathic gout involving toe without tophus 12/29/2016   History of CHF (congestive heart failure) 12/29/2016   History of COPD  12/29/2016   Plantar pustular psoriasis 12/29/2016   DVT (deep venous thrombosis) ([place]) 10/31/2016   COPD with acute exacerbation ([place]) 10/31/2016   CHF (congestive heart failure) ([place]) 10/31/2016   Prostate cancer ([place]) 08/30/2015   Malignant neoplasm of prostate ([place]) 07/04/2015   Chest pain at rest 07/05/2014   Weakness 62/26/3335   Complicated postphlebitic syndrome 11/16/2013   Personal history of DVT (deep vein thrombosis) 05/18/2013   Chronic anticoagulation 05/18/2013   Diverticulosis of colon without hemorrhage 05/18/2013    Rheumatoid arthritis ([place]) 05/18/2013   Seizure disorder ([place]) 05/18/2013   Hx of adenomatous colonic polyps 05/18/2013   GERD 05/08/2010   NAUSEA 05/08/2010   FLATULENCE-GAS-BLOATING 05/08/2010   PCP:  [name], MD Pharmacy:   [id number] [place], [address] [place]Phone: [phone]: [phone] [facility] [place] [address] [facility] [facility] [address]: [phone]: [address] [address]Phone: [phone]: [phone]    Social Determinants of Health (SDOH) Social History: [place] No Food Insecurity (09/04/2022)  Housing: Low Risk  (12/16/2022)  Transportation Needs: No Transportation Needs (12/16/2022)  Utilities: Not At Risk (09/04/2022)  Depression (PHQ2-9): Low Risk  (06/14/2019)  Financial Resource Strain: Low Risk  (12/16/2022)  Physical Activity: Inactive (11/16/2019)  Social Connections: Moderately Integrated (11/16/2019)  Stress: No Stress Concern Present (11/16/2019)  Tobacco Use: Medium Risk (01/23/2023)   SDOH Interventions:     Readmission Risk Interventions    01/24/2023    8:16 AM  Readmission Risk Prevention Plan  Transportation Screening Complete  HRI or Home Care Consult Complete  Social Work Consult for [place]/Counseling Complete  Palliative Care Screening Not Applicable  Medication Review (Press photographer Complete

## 2023-01-25 DIAGNOSIS — J441 Chronic obstructive pulmonary disease with (acute) exacerbation: Secondary | ICD-10-CM | POA: Diagnosis not present

## 2023-01-25 LAB — CBC
HCT: 38.7 % — ABNORMAL LOW (ref 39.0–52.0)
Hemoglobin: 12.8 g/dL — ABNORMAL LOW (ref 13.0–17.0)
MCH: 29.5 pg (ref 26.0–34.0)
MCHC: 33.1 g/dL (ref 30.0–36.0)
MCV: 89.2 fL (ref 80.0–100.0)
Platelets: 137 10*3/uL — ABNORMAL LOW (ref 150–400)
RBC: 4.34 MIL/uL (ref 4.22–5.81)
RDW: 14 % (ref 11.5–15.5)
WBC: 11 10*3/uL — ABNORMAL HIGH (ref 4.0–10.5)
nRBC: 0 % (ref 0.0–0.2)

## 2023-01-25 LAB — PROTIME-INR
INR: 3.5 — ABNORMAL HIGH (ref 0.8–1.2)
Prothrombin Time: 35.2 s — ABNORMAL HIGH (ref 11.4–15.2)

## 2023-01-25 MED ORDER — BREO ELLIPTA 50-25 MCG/INH IN AEPB: 1.0000 | INHALATION_SPRAY | Freq: Two times a day (BID) | RESPIRATORY_TRACT | 4 refills | Status: DC

## 2023-01-25 MED ORDER — DM-GUAIFENESIN ER 30-600 MG PO TB12: 1.0000 | ORAL_TABLET | Freq: Two times a day (BID) | ORAL | 0 refills | Status: DC

## 2023-01-25 MED ORDER — PREDNISONE 20 MG PO TABS: 40.0000 mg | ORAL_TABLET | Freq: Every day | ORAL | 0 refills | Status: AC

## 2023-01-25 MED ORDER — ALBUTEROL SULFATE (2.5 MG/3ML) 0.083% IN NEBU: 2.5000 mg | INHALATION_SOLUTION | RESPIRATORY_TRACT | 12 refills | Status: DC | PRN

## 2023-01-25 MED ORDER — OMEPRAZOLE 40 MG PO CPDR: 40.0000 mg | DELAYED_RELEASE_CAPSULE | Freq: Every day | ORAL | 2 refills | Status: AC

## 2023-01-25 MED ORDER — TRAZODONE HCL 50 MG PO TABS: 50.0000 mg | ORAL_TABLET | Freq: Every evening | ORAL | 0 refills | Status: DC | PRN

## 2023-01-25 MED ORDER — IPRATROPIUM-ALBUTEROL 0.5-2.5 (3) MG/3ML IN SOLN
3.0000 mL | Freq: Three times a day (TID) | RESPIRATORY_TRACT | Status: DC
  Administered 2023-01-25 (×2): 3 mL via RESPIRATORY_TRACT
  Filled 2023-01-25 (×2): qty 3

## 2023-01-25 MED ORDER — AZITHROMYCIN 500 MG PO TABS: 500.0000 mg | ORAL_TABLET | Freq: Every day | ORAL | 0 refills | Status: AC

## 2023-01-25 NOTE — Discharge Summary (Signed)
[name], is a 72 y.o. male  DOB 18-Apr-1951  MRN [id number].  Admission date:  01/23/2023  Admitting Physician  Roxan Hockey, MD  Discharge Date:  01/25/2023   Primary MD  [name], MD  Recommendations for primary care physician for things to follow:   1)Avoid ibuprofen/Advil/Aleve/Motrin/Goody Powders/Naproxen/BC powders/Meloxicam/Diclofenac/Indomethacin and other Nonsteroidal anti-inflammatory medications as these will make you more likely to bleed and can cause stomach ulcers, can also cause Kidney problems.   2)Repeat PT/INR in 3 to days with [name], MD   Admission Diagnosis  Hypokalemia [E87.6] Acute respiratory failure with hypoxia ([place]) [J96.01] COPD with acute exacerbation (HCC) [J44.1]   Discharge Diagnosis  Hypokalemia [E87.6] Acute respiratory failure with hypoxia ([place]) [J96.01] COPD with acute exacerbation (HCC) [J44.1]    Principal Problem:   COPD with acute exacerbation ([place]) Active Problems:   Chronic anticoagulation   History of COPD   OSA (obstructive sleep apnea)   Chronic diastolic HF (heart failure) (HCC)   Bronchiectasis without complication (HCC)   Stage 3a chronic kidney disease (CKD) (HCC)   Acute respiratory failure with hypoxia (HCC)      Past Medical History:  Diagnosis Date   Arthritis    RA   Asthma    CHF (congestive heart failure) (HCC)    pt denies    Clotting disorder (HCC)    DVT both legs    COPD (chronic obstructive pulmonary disease) (HCC)    DDD (degenerative disc disease), cervical    with UE's paresthesias   Diverticulosis    DVT, lower extremity ([place])    bilat   Eye abnormality    right eye drifts has difficulty focusing 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 Unknown origin.   Shortness of breath dyspnea    exertion    Sleep apnea    cpap    Tubular adenoma of colon 04/2008    Past Surgical History:  Procedure Laterality Date   CHOLECYSTECTOMY     CIRCUMCISION     COLONOSCOPY     CYSTOSCOPY WITH LITHOLAPAXY N/A 03/17/2019   Procedure: CYSTOSCOPY WITH LITHOLAPAXY AND REMOVAL OF FOREIGN BODY;  Surgeon: [name], MD;  Location: [facility];  Service: Urology;  Laterality: N/A;   DOPPLER ECHOCARDIOGRAPHY N/A 01-21-2012   TECHNICALLY DIFFICULT. MILD CONCENTRIC LV HYPERTROPHY. LV CAVITY IS SMALL.. EF=> 55%. TRANSMITRAL SPECTRAL FLOW PATTREN IS SUGGESTIVE OF IMPAIRED LV RELAXATION. RV SYSTOLIC PRESSURE IS 85YIFO. LEFT ATRIAL SIZE IS NORMAL. AV APPEARS MILDLY SCLEROTIC. NO SIGN VALVE DISEASE NOTED.   LEFT HEART  CATH AND CORONARY ANGIOGRAPHY N/A 03/12/2022   Procedure: LEFT HEART CATH AND CORONARY ANGIOGRAPHY;  Surgeon: Sherren Mocha, MD;  Location: [facility];  Service: Cardiovascular;  Laterality: N/A;   LYMPHADENECTOMY Bilateral 08/30/2015   Procedure: PELVIC LYMPHADENECTOMY;  Surgeon: [name], MD;  Location: [facility];  Service: Urology;  Laterality: Bilateral;   NUCLEAR STRESS TEST N/A 01-21-2012   NORMAL PATTERN OF PERFUSION IN ALL REGIONS. POST STRESS LV SIZE IS NORMAL. NO EVIDENCE OF INDUCIBLE ISCHEMIA. EF 54%.   POLYPECTOMY     PROSTATE BIOPSY     ROBOT ASSISTED LAPAROSCOPIC RADICAL PROSTATECTOMY N/A 08/30/2015   Procedure: ROBOTIC ASSISTED LAPAROSCOPIC RADICAL PROSTATECTOMY;  Surgeon: [name], MD;  Location: [facility];  Service: Urology;  Laterality: N/A;   URINARY SPHINCTER IMPLANT N/A 03/20/2021   Procedure: ARTIFICIAL URINARY SPHINCTER CYSTOSCOPY;  Surgeon: Bjorn Loser, MD;  Location: [facility];  Service: Urology;   Laterality: N/A;  REQUESTING 90 MINS   US VENOUS LOWER EXT Right 03/07/11   PERSISTENT DVT IN RIGHT LOWER EXT.WITH PERSISTANT VISUALIZATION OF HYPOECHOIC THROMBUS WITHIN THE FEMORAL, PROFUNDA FEMORAL AND POPLITEAL VEINS. WHEN COMPARED TO PREVIOUS, CLOT IS NO LONGER IDENTIFIED WITH IN THE RIGHT CFV.       HPI  from the history and physical done on the day of admission:   [name]  is a 72 y.o. male with past medical history relevant for chronic diastolic dysfunction CHF, obesity/OSA on CPAP, COPD and bronchiectasis, history of DVT chronically on Coumadin therapy, who presented to the ED with cough congestion and shortness of breath with wheezing for the last 10 days or so -He saw his PCP last week when symptoms started he was given a shot of a steroid medication and given doxycycline symptoms have persisted and worsened -Additional history obtained from patient's daughter and wife at bedside -Chills occasional fevers at the beginning of his illness -Cough with nausea----sputum is mostly clear -No frank chest pains no palpitations no dizziness no pleuritic type symptoms no leg pains -Chest x-ray without acute cardiopulmonary findings patient has chronically elevated left hemidiaphragm- -WBC normal -Creatinine WNL -INR 3.0 -Elevated lactic acid noted the setting of hypoxia and decreased oral intake with persistent nausea -Patient required up to 3 L of oxygen via nasal cannula in the ED -He was tachypneic tachycardic he required multiple breathing treatments -COVID, flu and RSV negative -Patient failed outpatient tr -O2 sats was initially 90% on room air on arrival to the ED however when patient moved around and O2 sats dropped down to 82% on room air -he was given breathing treatments and attempts were made to wean off oxygen however O2 sats dropped back down to 85% on room air he was placed back on oxygen and titrated up to 3 L per nasal cannulaeatment with steroid shot and oral  doxycycline given by PCP last week     Hospital Course:   Brief Narrative:  72 y.o. male with past medical history relevant for chronic diastolic dysfunction CHF, obesity/OSA on CPAP, COPD and bronchiectasis, history of DVT chronically on Coumadin therapy admitted on 01/23/2023 with acute hypoxic respiratory failure secondary to acute COPD exacerbation after failing outpatient management by PCP     -Assessment and Plan: 1) acute COPD exacerbation in the setting of bronchiectasis--- -much improved after treatment with IV Solu-Medrol, azithromycin mucolytics and bronchodilators -No pneumonia clinically and radiologically -Cough dyspnea and respiratory symptoms improved -Successfully weaned off oxygen at this time   2) acute hypoxic respiratory failure--- due  to #1 above -Resolved as above #1   3) obesity/OSA--- continue CPAP nightly   4) history of DVT--- continue Coumadin therapy -Repeat PT/INR with PCP in about 3 days  5) seizure disorder--- continue Dilantin, no recent seizures   6) diastolic dysfunction CHF--- okay to resume Lasix and Aldactone .   Dispo: The patient is from: Home              Anticipated d/c is to: Home   Discharge Condition: Stable without hypoxia  Follow UP   Follow-up Information     [name], MD Follow up.   Specialty: Family Medicine Why: Repeat PT/INR test Contact information: [place] [address] [phone]                 Diet and Activity recommendation:  As advised  Discharge Instructions    Discharge Instructions     Call MD for:  difficulty breathing, headache or visual disturbances   Complete by: As directed    Call MD for:  persistant dizziness or light-headedness   Complete by: As directed    Call MD for:  persistant nausea and vomiting   Complete by: As directed    Call MD for:  temperature >100.4   Complete by: As directed    Diet - low sodium heart healthy   Complete by: As  directed    Discharge instructions   Complete by: As directed    1)Avoid ibuprofen/Advil/Aleve/Motrin/Goody Powders/Naproxen/BC powders/Meloxicam/Diclofenac/Indomethacin and other Nonsteroidal anti-inflammatory medications as these will make you more likely to bleed and can cause stomach ulcers, can also cause Kidney problems.   2)Repeat PT/INR in 3 to days with [name], MD   Increase activity slowly   Complete by: As directed        Discharge Medications     Allergies as of 01/25/2023       Reactions   Orencia [abatacept] Anaphylaxis   Had prostate cancer   Carbamazepine Rash   REACTION: makes drowsy BRAND NAME IS TEGRETOL   Celecoxib Itching, Rash   BRAND NAME IS CELEBREX   Cephalexin Rash   "Severe Rash".  BRAND NAME IS KEFLEX.    Enbrel [etanercept] Rash   Humira [adalimumab] Rash   Levofloxacin Other (See Comments)   REACTION: GI Intolerance BRAND NAME IS LEVAQUIN   Sulfa Antibiotics Rash   Sulfasalazine Rash        Medication List     STOP taking these medications    Breo Ellipta 100-25 MCG/ACT Aepb Generic drug: fluticasone furoate-vilanterol Replaced by: [place] Patter 50-25 MCG/ACT Aepb   doxycycline 100 MG capsule Commonly known as: VIBRAMYCIN       TAKE these medications    acetaminophen 650 MG CR tablet Commonly known as: TYLENOL Take 1,300 mg by mouth every 8 (eight) hours as needed for pain.   albuterol (2.5 MG/3ML) 0.083% nebulizer solution Commonly known as: PROVENTIL Take 3 mLs (2.5 mg total) by nebulization every 2 (two) hours as needed for wheezing or shortness of breath. What changed: when to take this   alendronate 70 MG tablet Commonly known as: FOSAMAX TAKE 1 TABLET BY MOUTH EVERY WEDNESDAY. TAKE WITH A FULL GLASS OF WATER ON AN EMPTY STOMACH.   allopurinol 100 MG tablet Commonly known as: ZYLOPRIM TAKE 1 TABLET BY MOUTH TWICE A DAY   ascorbic acid 500 MG tablet Commonly known as: VITAMIN C Take 500 mg by mouth  daily.   azithromycin 500 MG tablet Commonly known as: ACZYSAYTK  Take 1 tablet (500 mg total) by mouth daily for 3 days.   Breo Ellipta 50-25 MCG/ACT Aepb Generic drug: Fluticasone Furoate-Vilanterol Inhale 1 Inhalation into the lungs 2 (two) times daily. Replaces: Breo Ellipta 100-25 MCG/ACT Aepb   cholecalciferol 25 MCG (1000 UNIT) tablet Commonly known as: VITAMIN D3 Take 1,000 Units by mouth daily.   dextromethorphan-guaiFENesin 30-600 MG 12hr tablet Commonly known as: MUCINEX DM Take 1 tablet by mouth 2 (two) times daily.   diclofenac Sodium 1 % Gel Commonly known as: VOLTAREN Apply 2-4 grams to affected joint 4 times daily as needed. What changed: additional instructions   dicyclomine 10 MG capsule Commonly known as: BENTYL TAKE 1 CAPSULE BY MOUTH 4 TIMES DAILY BEFORE MEALS AND AT BEDTIME What changed: See the new instructions.   folic acid 1 MG tablet Commonly known as: FOLVITE TAKE 2 TABLETS BY MOUTH EVERY MORNING   furosemide 80 MG tablet Commonly known as: LASIX TAKE 1 TABLET BY MOUTH EVERY DAY   Incruse Ellipta 62.5 MCG/ACT Aepb Generic drug: umeclidinium bromide INHALE 1 PUFF BY MOUTH EVERY DAY   leflunomide 20 MG tablet Commonly known as: ARAVA TAKE 1 TABLET BY MOUTH EVERY DAY   omeprazole 40 MG capsule Commonly known as: PRILOSEC Take 1 capsule (40 mg total) by mouth daily. What changed: when to take this   oxybutynin 10 MG 24 hr tablet Commonly known as: DITROPAN-XL Take 1 tablet (10 mg total) by mouth daily.   phenytoin 100 MG ER capsule Commonly known as: DILANTIN Take 100-200 mg by mouth See admin instructions. Take one capsule in the morning and 2 capsules at bedtime   Potassium Chloride ER 20 MEQ Tbcr Take 20 mEq by mouth daily.   predniSONE 20 MG tablet Commonly known as: DELTASONE Take 2 tablets (40 mg total) by mouth daily with breakfast for 5 days.   rosuvastatin 10 MG tablet Commonly known as: CRESTOR Take 10 mg by mouth  daily.   spironolactone 50 MG tablet Commonly known as: ALDACTONE Take 1 tablet (50 mg total) by mouth daily.   traZODone 50 MG tablet Commonly known as: DESYREL Take 1 tablet (50 mg total) by mouth at bedtime as needed for up to 5 days for sleep.   Trelegy Ellipta 100-62.5-25 MCG/ACT Aepb Generic drug: Fluticasone-Umeclidin-Vilant Inhale 1 puff into the lungs daily.   warfarin 3 MG tablet Commonly known as: COUMADIN Take 1 tablet (3 mg total) by mouth at bedtime. Restart on 3/24 (48 hours after surgery)       Major procedures and Radiology Reports - PLEASE review detailed and final reports for all details, in brief -   DG Chest 2 View  Result Date: 01/23/2023 CLINICAL DATA:  72 year old male with cough. Wheezing, shortness of breath and nausea. EXAM: CHEST - 2 VIEW COMPARISON:  Chest CT 06/18/2022 and earlier. FINDINGS: Chronic elevation of the left hemidiaphragm with left lower lobe atelectasis. Lung volumes and mediastinal contours are stable from last year. No superimposed pneumothorax, pulmonary edema, evidence of pleural effusion or new pulmonary opacity. Visualized tracheal air column is within normal limits. No acute osseous abnormality identified. Stable cholecystectomy clips. Negative visible bowel gas. 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 No acute cardiopulmonary abnormality. Electronically Signed   By: Genevie Ann M.D.   On: 01/23/2023 07:17    Micro Results   Recent Results (from the past 240 hour(s))  Resp panel by RT-PCR (RSV, Flu A&B, Covid) Anterior Nasal Swab  Status: None   Collection Time: 01/23/23  6:40 AM   Specimen: Anterior Nasal Swab  Result Value Ref Range Status   SARS Coronavirus 2 by RT PCR NEGATIVE NEGATIVE Final    Comment: (NOTE) SARS-CoV-2 target nucleic acids are NOT DETECTED.  The SARS-CoV-2 RNA is generally detectable in upper respiratory specimens during the acute phase of  infection. The lowest concentration of SARS-CoV-2 viral copies this assay can detect is 138 copies/mL. A negative result does not preclude SARS-Cov-2 infection and should not be used as the sole basis for treatment or other patient management decisions. A negative result may occur with  improper specimen collection/handling, submission of specimen other than nasopharyngeal swab, presence of viral mutation(s) within the areas targeted by this assay, and inadequate number of viral copies(<138 copies/mL). A negative result must be combined with clinical observations, patient history, and epidemiological information. The expected result is Negative.  Fact Sheet for Patients:  EntrepreneurPulse.com.au  Fact Sheet for Healthcare Providers:  IncredibleEmployment.be  This test is no t yet approved or cleared by the [place] FDA and  has been authorized for detection and/or diagnosis of SARS-CoV-2 by FDA under an Emergency Use Authorization (EUA). This EUA will remain  in effect (meaning this test can be used) for the duration of the COVID-19 declaration under Section 564(b)(1) of the Act, 21 U.S.C.section 360bbb-3(b)(1), unless the authorization is terminated  or revoked sooner.       Influenza A by PCR NEGATIVE NEGATIVE Final   Influenza B by PCR NEGATIVE NEGATIVE Final    Comment: (NOTE) The Xpert Xpress SARS-CoV-2/FLU/RSV plus assay is intended as an aid in the diagnosis of influenza from Nasopharyngeal swab specimens and should not be used as a sole basis for treatment. Nasal washings and aspirates are unacceptable for Xpert Xpress SARS-CoV-2/FLU/RSV testing.  Fact Sheet for Patients: EntrepreneurPulse.com.au  Fact Sheet for Healthcare Providers: IncredibleEmployment.be  This test is not yet approved or cleared by the [place] FDA and has been authorized for detection and/or diagnosis of SARS-CoV-2  by FDA under an Emergency Use Authorization (EUA). This EUA will remain in effect (meaning this test can be used) for the duration of the COVID-19 declaration under Section 564(b)(1) of the Act, 21 U.S.C. section 360bbb-3(b)(1), unless the authorization is terminated or revoked.     Resp Syncytial Virus by PCR NEGATIVE NEGATIVE Final    Comment: (NOTE) Fact Sheet for Patients: EntrepreneurPulse.com.au  Fact Sheet for Healthcare Providers: IncredibleEmployment.be  This test is not yet approved or cleared by the [place] FDA and has been authorized for detection and/or diagnosis of SARS-CoV-2 by FDA under an Emergency Use Authorization (EUA). This EUA will remain in effect (meaning this test can be used) for the duration of the COVID-19 declaration under Section 564(b)(1) of the Act, 21 U.S.C. section 360bbb-3(b)(1), unless the authorization is terminated or revoked.  Performed at [facility], [address] [place], [place] [address]   Blood Culture (routine x 2)     Status: None (Preliminary result)   Collection Time: 01/23/23 10:38 AM   Specimen: BLOOD RIGHT FOREARM  Result Value Ref Range Status   Specimen Description BLOOD RIGHT FOREARM  Final   Special Requests   Final    BOTTLES DRAWN AEROBIC AND ANAEROBIC Blood Culture adequate volume   Culture   Final    NO GROWTH 2 DAYS Performed at [facility], [address] [place], [place] [address]    Report Status PENDING  Incomplete  Blood Culture (routine  x 2)     Status: None (Preliminary result)   Collection Time: 01/23/23 10:43 AM   Specimen: BLOOD LEFT HAND  Result Value Ref Range Status   Specimen Description BLOOD LEFT HAND  Final   Special Requests   Final    BOTTLES DRAWN AEROBIC AND ANAEROBIC Blood Culture results may not be optimal due to an excessive volume of blood received in culture bottles   Culture   Final    NO GROWTH 2 DAYS Performed at [facility], [address] [place], [place] [address]    Report Status PENDING  Incomplete  Urine Culture (for pregnant, neutropenic or urologic patients or patients with an indwelling urinary catheter)     Status: None   Collection Time: 01/23/23 11:31 AM   Specimen: Urine, Clean Catch  Result Value Ref Range Status   Specimen Description   Final    URINE, CLEAN CATCH Performed at [facility], [address], [place], [place] [address]    Special Requests   Final    NONE Performed at [facility], [address], [place], [place] [address]    Culture   Final    NO GROWTH Performed at [facility], [place] [address] [place], [place] [address]    Report Status 01/24/2023 FINAL  Final    Today   Subjective    [name] today has no new complaints No fever  Or chills   No Nausea, Vomiting or Diarrhea  -Cough wheezing and dyspnea improved significantly -Successfully weaned off oxygen       Patient has been seen and examined prior to discharge   Objective   Blood pressure (!) 145/76, pulse 75, temperature 98.6 F (37 C), resp. rate 16, height 5' 8.5" (1.74 m), weight 100.3 kg, SpO2 (!) 86 %.   Intake/Output Summary (Last 24 hours) at 01/25/2023 1626 Last data filed at 01/25/2023 0900 Gross per 24 hour  Intake 1200 ml  Output 1200 ml  Net 0 ml    Exam Gen:- Awake Alert, no acute distress  HEENT:- .AT, No sclera icterus Neck-Supple Neck,No JVD,.  Lungs-  -much improved air movement, no wheezing CV- S1, S2 normal, regular Abd-  +ve B.Sounds, Abd Soft, No tenderness,    Extremity/Skin:- No  edema,   good pulses Psych-affect is appropriate, oriented x3 Neuro-no new focal deficits, no tremors    Data Review   CBC w Diff:  Lab Results  Component Value Date   WBC 11.0 (H) 01/25/2023   HGB 12.8 (L) 01/25/2023   HGB 14.7 11/25/2016   HCT 38.7 (L) 01/25/2023   HCT 43.4 11/25/2016   PLT 137 (L) 01/25/2023   PLT 213 11/25/2016   LYMPHOPCT 15 01/23/2023   MONOPCT 15  01/23/2023   EOSPCT 3 01/23/2023   BASOPCT 1 01/23/2023    CMP:  Lab Results  Component Value Date   NA 137 01/24/2023   NA 145 (H) 04/25/2022   K 4.1 01/24/2023   CL 100 01/24/2023   CO2 27 01/24/2023   BUN 30 (H) 01/24/2023   BUN 16 04/25/2022   CREATININE 1.49 (H) 01/24/2023   CREATININE 1.20 11/30/2021   PROT 6.8 01/23/2023   PROT 6.6 11/25/2016   ALBUMIN 4.0 01/23/2023   ALBUMIN 3.8 11/25/2016   BILITOT 1.1 01/23/2023   BILITOT 0.4 11/25/2016   ALKPHOS 62 01/23/2023   AST 46 (H) 01/23/2023   ALT 29 01/23/2023  .  Total Discharge time is about 33 minutes  Roxan Hockey M.D on 01/25/2023 at 4:26 PM  Go to [email or web address] -  for contact info  [facility] - Office  [phone]

## 2023-01-25 NOTE — Progress Notes (Signed)
Gave patient back his home meds from pharmacy

## 2023-01-25 NOTE — Plan of Care (Signed)
  Problem: Education: Goal: Knowledge of General Education information will improve Description: Including pain rating scale, medication(s)/side effects and non-pharmacologic comfort measures Outcome: Progressing   Problem: Health Behavior/Discharge Planning: Goal: Ability to manage health-related needs will improve Outcome: Progressing

## 2023-01-25 NOTE — Progress Notes (Signed)
Patient rested through the night, with CPAP on. No distress noted.

## 2023-01-25 NOTE — Progress Notes (Signed)
Removed Ivs-CDI. Reviewed d/c paperwork work with patient and wife. Answered all questions. Wheeled stable patient and belongings to main entrance where he was picked up by his wife.

## 2023-01-25 NOTE — Progress Notes (Signed)
[facility] for warfarin Indication:  hx DVT  Allergies  Allergen Reactions   Orencia [Abatacept] Anaphylaxis    Had prostate cancer   Carbamazepine Rash    REACTION: makes drowsy BRAND NAME IS TEGRETOL   Celecoxib Itching and Rash    BRAND NAME IS CELEBREX   Cephalexin Rash    "Severe Rash".  BRAND NAME IS KEFLEX.    Enbrel [Etanercept] Rash   Humira [Adalimumab] Rash   Levofloxacin Other (See Comments)    REACTION: GI Intolerance BRAND NAME IS LEVAQUIN   Sulfa Antibiotics Rash   Sulfasalazine Rash    Patient Measurements: Height: 5' 8.5" (174 cm) Weight: 100.3 kg (221 lb 1.9 oz) IBW/kg (Calculated) : 69.55 Heparin Dosing Weight:   Vital Signs: Temp: 98.6 F (37 C) (01/27 0418) BP: 145/76 (01/27 0418) Pulse Rate: 75 (01/27 0418)  Labs: Recent Labs    01/23/23 0717 01/24/23 0421 01/25/23 0343  HGB 15.2  --  12.8*  HCT 44.7  --  38.7*  PLT 151  --  137*  LABPROT 30.9* 35.4* 35.2*  INR 3.0* 3.6* 3.5*  CREATININE 1.40* 1.49*  --     Estimated Creatinine Clearance: 51.9 mL/min (A) (by C-G formula based on SCr of 1.49 mg/dL (H)).  Assessment: Pharmacy consulted to dose warfarin in patient with history of DVT.  Patient's home dose listed as 3 mg daily with last dose 1/24.  INR on admission is 3.0.  Today: INR remains elevated with minimal change from yesterday. Likely COPD exac, antibiotics, and recent decrease in po intake contributing to elevated labs. However no adverse events noted. Will monitor downward trend in Hgb  Goal of Therapy:  INR 2-3 Monitor platelets by anticoagulation protocol: Yes   Plan:  Hold warfarin today. INR daily  Lorenso Courier, PharmD Clinical Pharmacist 01/25/2023 9:38 AM

## 2023-01-25 NOTE — Plan of Care (Signed)
  Problem: Education: Goal: Knowledge of General Education information will improve Description: Including pain rating scale, medication(s)/side effects and non-pharmacologic comfort measures 01/25/2023 1422 by Santa Lighter, RN Outcome: Adequate for Discharge 01/25/2023 1248 by Santa Lighter, RN Outcome: Progressing   Problem: Health Behavior/Discharge Planning: Goal: Ability to manage health-related needs will improve 01/25/2023 1422 by Santa Lighter, RN Outcome: Adequate for Discharge 01/25/2023 1248 by Santa Lighter, RN Outcome: Progressing   Problem: Clinical Measurements: Goal: Ability to maintain clinical measurements within normal limits will improve Outcome: Adequate for Discharge Goal: Will remain free from infection Outcome: Adequate for Discharge Goal: Diagnostic test results will improve Outcome: Adequate for Discharge Goal: Respiratory complications will improve Outcome: Adequate for Discharge Goal: Cardiovascular complication will be avoided Outcome: Adequate for Discharge   Problem: Activity: Goal: Risk for activity intolerance will decrease Outcome: Adequate for Discharge   Problem: Nutrition: Goal: Adequate nutrition will be maintained Outcome: Adequate for Discharge   Problem: Coping: Goal: Level of anxiety will decrease Outcome: Adequate for Discharge   Problem: Elimination: Goal: Will not experience complications related to bowel motility Outcome: Adequate for Discharge Goal: Will not experience complications related to urinary retention Outcome: Adequate for Discharge   Problem: Pain Managment: Goal: General experience of comfort will improve Outcome: Adequate for Discharge   Problem: Safety: Goal: Ability to remain free from injury will improve Outcome: Adequate for Discharge   Problem: Skin Integrity: Goal: Risk for impaired skin integrity will decrease Outcome: Adequate for Discharge

## 2023-01-25 NOTE — Progress Notes (Signed)
SATURATION QUALIFICATIONS: (This note is used to comply with regulatory documentation for home oxygen)  Patient Saturations on Room Air at Rest = 94%  Patient Saturations on Room Air while Ambulating = 95%  Patient Saturations on  Liters of oxygen while Ambulating = n/a%  Please briefly explain why patient needs home oxygen: Did not walk patient on oxygen because his oxygen saturation did not drop enough to apply oxygen

## 2023-01-25 NOTE — Discharge Instructions (Signed)
1)Avoid ibuprofen/Advil/Aleve/Motrin/Goody Powders/Naproxen/BC powders/Meloxicam/Diclofenac/Indomethacin and other Nonsteroidal anti-inflammatory medications as these will make you more likely to bleed and can cause stomach ulcers, can also cause Kidney problems.   2)Repeat PT/INR in 3 to days with [name], MD

## 2023-01-28 ENCOUNTER — Other Ambulatory Visit (HOSPITAL_COMMUNITY): Payer: Self-pay

## 2023-01-28 LAB — CULTURE, BLOOD (ROUTINE X 2)
Culture: NO GROWTH
Culture: NO GROWTH
Special Requests: ADEQUATE

## 2023-01-29 NOTE — Progress Notes (Unsigned)
Office Visit Note  Patient: [name]             Date of Birth: June 29, 1951           MRN: [id number]             PCP: [name], MD Referring: [name], MD Visit Date: 02/12/2023 Occupation: @GUAROCC$ @  Subjective:  Medication monitoring   History of Present Illness: [name] is a 72 y.o. male with history of seropositive rheumatoid arthritis, gout, and osteoporosis.  Patient remains on Actemra 5m/kg IV infusions every 4 weeks and Arava 20 mg 1 tablet by mouth daily.  Patient reports that his last infusion was administered on 02/07/2023.  He states he had to postpone his infusion by 1 week due to recently being hospitalized for acute respiratory failure with hypoxia due to a COPD exacerbation.  Patient reports that his symptoms have completely resolved.  He denies any other recurrent or recent infections.  His next Actemra infusion is scheduled on 03/07/2023.  Patient denies any signs or symptoms of a rheumatoid arthritis flare despite the gap in therapy.  He denies any joint swelling at this time.  He has not needed to take any over-the-counter products for pain relief.  He denies any signs or symptoms of a gout flare.  He continues to take allopurinol 200 mg daily.  He denies any new medical conditions.   Activities of Daily Living:  Patient reports morning stiffness for 0 minutes.   Patient Denies nocturnal pain.  Difficulty dressing/grooming: Denies Difficulty climbing stairs: Denies Difficulty getting out of chair: Denies Difficulty using hands for taps, buttons, cutlery, and/or writing: Denies  Review of Systems  Constitutional: Negative.  Negative for fatigue.  HENT:  Positive for mouth dryness. Negative for mouth sores.   Eyes: Negative.  Negative for dryness.  Respiratory:  Positive for shortness of breath.   Cardiovascular: Negative.  Negative for chest pain and palpitations.  Gastrointestinal:  Positive for blood in stool. Negative for constipation and  diarrhea.  Endocrine: Negative.  Negative for increased urination.  Genitourinary: Negative.  Negative for involuntary urination.  Musculoskeletal:  Negative for joint pain, gait problem, joint pain, joint swelling, myalgias, muscle weakness, morning stiffness, muscle tenderness and myalgias.  Skin: Negative.  Negative for pallor, rash, hair loss and sensitivity to sunlight.  Allergic/Immunologic: Negative.  Negative for susceptible to infections.  Neurological: Negative.  Negative for dizziness and headaches.  Hematological: Negative.  Negative for swollen glands.  Psychiatric/Behavioral: Negative.  Negative for depressed mood and sleep disturbance. The patient is not nervous/anxious.     PMFS History:  Patient Active Problem List   Diagnosis Date Noted   Acute respiratory failure with hypoxia ([place] 01/23/2023   Diaphragm paralysis 08/28/2022   Chest pain 03/11/2022   Stage 3a chronic kidney disease (CKD) ([facility] 03/11/2022   Bronchiectasis without complication ([facility] [id number]  Incontinence when straining, male 03/20/2021   Erectile dysfunction after radical prostatectomy 09/08/2020   OAB (overactive bladder) 02/23/2020   Class 2 severe obesity due to excess calories with serious comorbidity and body mass index (BMI) of 35.0 to 35.9 in adult ([facility]    Chronic diastolic HF (heart failure) ([facility]    [place]atia sepsis ([facility] 06/08/2019   Voice hoarseness 04/26/2019   OSA (obstructive sleep apnea) 03/10/2019   Abnormal CT of the chest 02/09/2019   Dyspnea on exertion 10/22/2017   History of seizure disorder 02/27/2017   Primary osteoarthritis of both hands 02/27/2017  Primary osteoarthritis of both feet 02/27/2017   Idiopathic gout of multiple sites 02/27/2017   High risk medication use 12/29/2016   History of prostate cancer 12/29/2016   Primary osteoarthritis of both knees 12/29/2016   Chronic idiopathic gout involving toe without tophus 12/29/2016   History of CHF (congestive heart  failure) 12/29/2016   History of COPD 12/29/2016   Plantar pustular psoriasis 12/29/2016   DVT (deep venous thrombosis) ([place]) 10/31/2016   COPD with acute exacerbation ([place]) 10/31/2016   CHF (congestive heart failure) ([place]) 10/31/2016   Prostate cancer ([place]) 08/30/2015   Malignant neoplasm of prostate ([place]) 07/04/2015   Chest pain at rest 07/05/2014   Weakness [id number]   Complicated postphlebitic syndrome 11/16/2013   Personal history of DVT (deep vein thrombosis) 05/18/2013   Chronic anticoagulation 05/18/2013   Diverticulosis of colon without hemorrhage 05/18/2013   Rheumatoid arthritis ([place]) 05/18/2013   Seizure disorder ([place]) 05/18/2013   Hx of adenomatous colonic polyps 05/18/2013   GERD 05/08/2010   NAUSEA 05/08/2010   FLATULENCE-GAS-BLOATING 05/08/2010    Past Medical History:  Diagnosis Date   Arthritis    RA   Asthma    CHF (congestive heart failure) ([place])    pt denies    Clotting disorder ([place])    DVT both legs    COPD (chronic obstructive pulmonary disease) ([place])    DDD (degenerative disc disease), cervical    with UE's paresthesias   Diverticulosis    DVT, lower extremity ([place])    bilat   Eye abnormality    right eye drifts has difficulty focusing with right eye has had since birth    GERD (gastroesophageal reflux disease)    Gout    History of measles    History of shingles    Hypercholesterolemia    IBS (irritable bowel syndrome)    IBS (irritable bowel syndrome)    Peripheral edema    Pneumonia    hx of    Prostate cancer ([place])    Rheumatoid arthritis ([place])    Seizures ([place])    last seizure 20 years ago; on dilantin. Unknown origin.   Shortness of breath dyspnea    exertion    Sleep apnea    cpap    Tubular adenoma of colon 04/2008    Family History  Problem Relation Age of Onset   Skin cancer Father    Stomach cancer Father    Heart attack Father    Alzheimer's disease Mother    Throat cancer Brother    Stomach cancer Brother    Lung  cancer Brother    Heart attack Brother    Heart attack Brother    Breast cancer Sister    Heart attack Sister    Stroke Sister    Bladder Cancer Sister    Heart murmur Sister    Colon cancer Brother    Colon polyps Neg Hx    Past Surgical History:  Procedure Laterality Date   CHOLECYSTECTOMY     CIRCUMCISION     COLONOSCOPY     CYSTOSCOPY WITH LITHOLAPAXY N/A 03/17/2019   Procedure: CYSTOSCOPY WITH LITHOLAPAXY AND REMOVAL OF FOREIGN BODY;  Surgeon: [name], MD;  Location: [facility];  Service: Urology;  Laterality: N/A;   DOPPLER ECHOCARDIOGRAPHY N/A 01-21-2012   TECHNICALLY DIFFICULT. MILD CONCENTRIC LV HYPERTROPHY. LV CAVITY IS SMALL.. EF=> 55%. TRANSMITRAL SPECTRAL FLOW PATTREN IS SUGGESTIVE OF IMPAIRED LV RELAXATION. RV SYSTOLIC PRESSURE IS [id number]. LEFT ATRIAL SIZE IS NORMAL. AV APPEARS MILDLY SCLEROTIC.  NO SIGN VALVE DISEASE NOTED.   LEFT HEART CATH AND CORONARY ANGIOGRAPHY N/A 03/12/2022   Procedure: LEFT HEART CATH AND CORONARY ANGIOGRAPHY;  Surgeon: Sherren Mocha, MD;  Location: [facility];  Service: Cardiovascular;  Laterality: N/A;   LYMPHADENECTOMY Bilateral 08/30/2015   Procedure: PELVIC LYMPHADENECTOMY;  Surgeon: [name], MD;  Location: [facility];  Service: Urology;  Laterality: Bilateral;   NUCLEAR STRESS TEST N/A 01-21-2012   NORMAL PATTERN OF PERFUSION IN ALL REGIONS. POST STRESS LV SIZE IS NORMAL. NO EVIDENCE OF INDUCIBLE ISCHEMIA. EF 54%.   POLYPECTOMY     PROSTATE BIOPSY     ROBOT ASSISTED LAPAROSCOPIC RADICAL PROSTATECTOMY N/A 08/30/2015   Procedure: ROBOTIC ASSISTED LAPAROSCOPIC RADICAL PROSTATECTOMY;  Surgeon: [name], MD;  Location: [facility];  Service: Urology;  Laterality: N/A;   URINARY SPHINCTER IMPLANT N/A 03/20/2021   Procedure: ARTIFICIAL URINARY SPHINCTER CYSTOSCOPY;  Surgeon: Bjorn Loser, MD;  Location: [facility];  Service: Urology;  Laterality: N/A;  REQUESTING 90 MINS   US VENOUS LOWER EXT Right 03/07/11   PERSISTENT DVT  IN RIGHT LOWER EXT.WITH PERSISTANT VISUALIZATION OF HYPOECHOIC THROMBUS WITHIN THE FEMORAL, PROFUNDA FEMORAL AND POPLITEAL VEINS. WHEN COMPARED TO PREVIOUS, CLOT IS NO LONGER IDENTIFIED WITH IN THE RIGHT CFV.   Social History   Social History Narrative   Not on file   Immunization History  Administered Date(s) Administered   Fluad Quad(high Dose 65+) 09/30/2019   Influenza, High Dose Seasonal PF 10/05/2018, 10/10/2021   Influenza,inj,Quad PF,6+ Mos 08/31/2015   Influenza-Unspecified 09/30/2016, 08/22/2017   Moderna Sars-Covid-2 Vaccination 01/21/2020, 02/18/2020, 10/25/2020, 10/10/2021   Pneumococcal Conjugate-13 10/05/2018   Pneumococcal-Unspecified 12/30/2012   Td 07/05/1996     Objective: Vital Signs: BP (!) 143/83 (BP Location: Left Arm, Patient Position: Sitting, Cuff Size: Normal)   Pulse 71   Resp 14   Ht 5' 8.5" (1.74 m)   Wt 228 lb (103.4 kg)   BMI 34.16 kg/m    Physical Exam Vitals and nursing note reviewed.  Constitutional:      Appearance: He is well-developed.  HENT:     Head: Normocephalic and atraumatic.  Eyes:     Conjunctiva/sclera: Conjunctivae normal.     Pupils: Pupils are equal, round, and reactive to light.  Cardiovascular:     Rate and Rhythm: Normal rate and regular rhythm.     Heart sounds: Normal heart sounds.  Pulmonary:     Effort: Pulmonary effort is normal.     Breath sounds: Normal breath sounds.  Abdominal:     General: Bowel sounds are normal.     Palpations: Abdomen is soft.  Musculoskeletal:     Cervical back: Normal range of motion and neck supple.  Skin:    General: Skin is warm and dry.     Capillary Refill: Capillary refill takes less than 2 seconds.  Neurological:     Mental Status: He is alert and oriented to person, place, and time.  Psychiatric:        Behavior: Behavior normal.      Musculoskeletal Exam: C-spine has limited range of motion with lateral rotation.  Postural thoracic kyphosis.  Some midline spinal  tenderness in the C-spine and thoracic spine.  Shoulder joints, elbow joints, wrist joints, MCPs, PIPs, DIPs have good range of motion with no synovitis.  PIP and DIP thickening noted.  Hip joints have slightly limited range of motion but no groin pain.  Knee joints have good range of motion with no warmth or  effusion.  Ankle joints have good range of motion.  Pedal edema noted bilaterally.  CDAI Exam: CDAI Score: -- Patient Global: 1 mm; Provider Global: 1 mm Swollen: --; Tender: -- Joint Exam 02/12/2023   No joint exam has been documented for this visit   There is currently no information documented on the homunculus. Go to the Rheumatology activity and complete the homunculus joint exam.  Investigation: No additional findings.  Imaging: DG Chest 2 View  Result Date: 01/23/2023 CLINICAL DATA:  72 year old male with cough. Wheezing, shortness of breath and nausea. EXAM: CHEST - 2 VIEW COMPARISON:  Chest CT 06/18/2022 and earlier. FINDINGS: Chronic elevation of the left hemidiaphragm with left lower lobe atelectasis. Lung volumes and mediastinal contours are stable from last year. No superimposed pneumothorax, pulmonary edema, evidence of pleural effusion or new pulmonary opacity. Visualized tracheal air column is within normal limits. No acute osseous abnormality identified. Stable cholecystectomy clips. Negative visible bowel gas. 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 No acute cardiopulmonary abnormality. Electronically Signed   By: Genevie Ann M.D.   On: 01/23/2023 07:17    Recent Labs: Lab Results  Component Value Date   WBC 11.0 (H) 01/25/2023   HGB 12.8 (L) 01/25/2023   PLT 137 (L) 01/25/2023   NA 137 01/24/2023   K 4.1 01/24/2023   CL 100 01/24/2023   CO2 27 01/24/2023   GLUCOSE 134 (H) 01/24/2023   BUN 30 (H) 01/24/2023   CREATININE 1.49 (H) 01/24/2023   BILITOT 1.1 01/23/2023   ALKPHOS 62 01/23/2023   AST 46 (H)  01/23/2023   ALT 29 01/23/2023   PROT 6.8 01/23/2023   ALBUMIN 4.0 01/23/2023   CALCIUM 9.1 01/24/2023   GFRAA >60 09/20/2020   QFTBGOLDPLUS Negative 02/01/2022    Speciality Comments: ACTEMRA 8m/kg x 4 weeks PPD negative 01/02/17  Procedures:  No procedures performed Allergies: Orencia [abatacept], Carbamazepine, Celecoxib, Cephalexin, Enbrel [etanercept], Humira [adalimumab], Levofloxacin, Sulfa antibiotics, and Sulfasalazine   Need updated DEXA     Assessment / Plan:     Visit Diagnoses:  Rheumatoid arthritis involving multiple sites with positive rheumatoid factor (HGreen: He has no synovitis on examination today.  He has not had any signs or symptoms of a rheumatoid arthritis flare.  He has not needed to take any over-the-counter products for pain relief.  His rheumatoid arthritis remains clinically stable on Actemra 4 mg/kg IV infusions every 4 weeks and Arava 20 mg 1 tablet by mouth daily.  Patient recently had to postpone his Actemra infusion by 1 week due to a recent hospitalization for acute respiratory failure secondary to a COPD exacerbation.  He was discharged on 01/25/2023 and his symptoms have completely resolved.  His last dose of Actemra was administered on 02/07/2023 at which time his tuberculosis test was also updated.  His next Actemra dose is due on 03/07/2023.  He plans on having routine lab work drawn with his infusions going forward.  He will remain on Arava as combination therapy.  A refill was sent to the pharmacy today.  He was advised to notify [place] he develops signs or symptoms of a flare.  He will follow-up in the office in 3 months or sooner if needed.  High risk medication use - Actemra 440mkg IV infusions every 4 weeks and Arava 20 mg 1 tablet by mouth daily.  A refill of arava was sent to the pharmacy today. The patients most recent infusion was on  02/07/2023.  His next Actemra infusion is scheduled on 03/07/2023. TB gold pending.  CBC updated on 01/25/23 and BMP drawn  on 1/26//24.  He will continue to have frequent lab monitoring with his infusions. Patient was recently hospitalized for a COPD exacerbation with acute respiratory failure.  He was discharged on 01/25/2023.  His symptoms have completely resolved.  He postpone his Actemra infusion by 1 week and held [place] until his symptoms had completely resolved.  Discussed the importance of holding Actemra and Arava if he develops signs or symptoms of an infection and to resume once the infection has completely cleared.  Idiopathic chronic gout of multiple sites without tophus - He has not had any signs or symptoms of a gout flare.  Uric acid was 5.9 on 11/30/21. Future order for uric acid will be placed today. He continues to take allopurinol 100 mg 2 tablets daily.  A refill of allopurinol was sent to the pharmacy today.   Primary osteoarthritis of both hands: He has PIP and DIP thickening consistent with osteoarthritis of both hands.  No inflammation noted.  Complete fist formation bilaterally.  Discussed the importance of joint protection and muscle strengthening.   Primary osteoarthritis of both knees: Good ROM of both knee joints with no discomfort.  No warmth or effusion noted.   Primary osteoarthritis of both feet: He is not experiencing any discomfort in his feet at this time.  He is wearing proper fitting shoes.   Age-related osteoporosis without current pathological fracture - DEXA on 09/28/2019 T-score -2.1 right femur neck with +12 % change in BMD.  DEXA was due to be updated in 2022.  Several orders for an updated bone density have been placed.  Patient has not yet scheduled a bone density. We will call to see if the order for the DEXA remains in place or if a new order needs to be placed.  Discussed the importance of updating his bone density in order for [place] to determine the best management for osteoporosis.  He continues to take Fosamax 70 mg 1 tablet once weekly.    Other medical conditions are listed  as follows:  History of abscessed tooth - 11/03/21-Clearance received on 10/3021 to resume arava and actemra.  History of COPD  History of adenomatous polyp of colon  History of seizure disorder  History of CHF (congestive heart failure)  History of prostate cancer  History of DVT (deep vein thrombosis)  Orders: Orders Placed This Encounter  Procedures   Uric acid   Meds ordered this encounter  Medications   leflunomide (ARAVA) 20 MG tablet    Sig: Take 1 tablet (20 mg total) by mouth daily.    Dispense:  90 tablet    Refill:  0   allopurinol (ZYLOPRIM) 100 MG tablet    Sig: Take 1 tablet (100 mg total) by mouth 2 (two) times daily.    Dispense:  180 tablet    Refill:  0     Follow-Up Instructions: Return in about 3 months (around 05/13/2023) for Rheumatoid arthritis, Gout.   Ofilia Neas, PA-C  Note - This record has been created using Dragon software.  Chart creation errors have been sought, but may not always  have been located. Such creation errors do not reflect on  the standard of medical care.

## 2023-01-30 ENCOUNTER — Ambulatory Visit: Payer: Medicare Other | Admitting: Physician Assistant

## 2023-01-30 DIAGNOSIS — M810 Age-related osteoporosis without current pathological fracture: Secondary | ICD-10-CM

## 2023-01-30 DIAGNOSIS — Z86010 Personal history of colonic polyps: Secondary | ICD-10-CM

## 2023-01-30 DIAGNOSIS — Z8719 Personal history of other diseases of the digestive system: Secondary | ICD-10-CM

## 2023-01-30 DIAGNOSIS — Z8669 Personal history of other diseases of the nervous system and sense organs: Secondary | ICD-10-CM

## 2023-01-30 DIAGNOSIS — Z8679 Personal history of other diseases of the circulatory system: Secondary | ICD-10-CM

## 2023-01-30 DIAGNOSIS — Z8546 Personal history of malignant neoplasm of prostate: Secondary | ICD-10-CM

## 2023-01-30 DIAGNOSIS — Z8739 Personal history of other diseases of the musculoskeletal system and connective tissue: Secondary | ICD-10-CM

## 2023-01-30 DIAGNOSIS — M0579 Rheumatoid arthritis with rheumatoid factor of multiple sites without organ or systems involvement: Secondary | ICD-10-CM

## 2023-01-30 DIAGNOSIS — Z86718 Personal history of other venous thrombosis and embolism: Secondary | ICD-10-CM

## 2023-01-30 DIAGNOSIS — Z79899 Other long term (current) drug therapy: Secondary | ICD-10-CM

## 2023-01-30 DIAGNOSIS — M19071 Primary osteoarthritis, right ankle and foot: Secondary | ICD-10-CM

## 2023-01-30 DIAGNOSIS — Z8709 Personal history of other diseases of the respiratory system: Secondary | ICD-10-CM

## 2023-01-30 DIAGNOSIS — M170 Bilateral primary osteoarthritis of knee: Secondary | ICD-10-CM

## 2023-01-30 DIAGNOSIS — M19041 Primary osteoarthritis, right hand: Secondary | ICD-10-CM

## 2023-01-31 ENCOUNTER — Inpatient Hospital Stay (HOSPITAL_COMMUNITY): Admission: RE | Admit: 2023-01-31 | Payer: Medicare Other | Source: Ambulatory Visit

## 2023-01-31 DIAGNOSIS — I50812 Chronic right heart failure: Secondary | ICD-10-CM | POA: Diagnosis not present

## 2023-01-31 DIAGNOSIS — M069 Rheumatoid arthritis, unspecified: Secondary | ICD-10-CM | POA: Diagnosis not present

## 2023-01-31 DIAGNOSIS — Z6833 Body mass index (BMI) 33.0-33.9, adult: Secondary | ICD-10-CM | POA: Diagnosis not present

## 2023-01-31 DIAGNOSIS — E6609 Other obesity due to excess calories: Secondary | ICD-10-CM | POA: Diagnosis not present

## 2023-01-31 DIAGNOSIS — Z7901 Long term (current) use of anticoagulants: Secondary | ICD-10-CM | POA: Diagnosis not present

## 2023-01-31 DIAGNOSIS — J449 Chronic obstructive pulmonary disease, unspecified: Secondary | ICD-10-CM | POA: Diagnosis not present

## 2023-02-07 ENCOUNTER — Ambulatory Visit (HOSPITAL_COMMUNITY)
Admission: RE | Admit: 2023-02-07 | Discharge: 2023-02-07 | Disposition: A | Discharge status: Home / Self Care | Payer: Medicare Other | Source: Ambulatory Visit | Attending: Rheumatology | Admitting: Rheumatology

## 2023-02-07 DIAGNOSIS — Z111 Encounter for screening for respiratory tuberculosis: Secondary | ICD-10-CM | POA: Insufficient documentation

## 2023-02-07 DIAGNOSIS — Z79899 Other long term (current) drug therapy: Secondary | ICD-10-CM | POA: Diagnosis not present

## 2023-02-07 DIAGNOSIS — M0579 Rheumatoid arthritis with rheumatoid factor of multiple sites without organ or systems involvement: Secondary | ICD-10-CM | POA: Diagnosis not present

## 2023-02-07 MED ORDER — TOCILIZUMAB 400 MG/20ML IV SOLN
4.0000 mg/kg | INTRAVENOUS | Status: DC
  Administered 2023-02-07: 402 mg via INTRAVENOUS
  Filled 2023-02-07: qty 20.1

## 2023-02-12 ENCOUNTER — Other Ambulatory Visit: Payer: Self-pay | Admitting: *Deleted

## 2023-02-12 ENCOUNTER — Ambulatory Visit: Payer: Medicare Other | Attending: Rheumatology | Admitting: Physician Assistant

## 2023-02-12 ENCOUNTER — Encounter: Payer: Self-pay | Admitting: Physician Assistant

## 2023-02-12 VITALS — BP 143/83 | HR 71 | Resp 14 | Ht 68.5 in | Wt 228.0 lb

## 2023-02-12 DIAGNOSIS — M810 Age-related osteoporosis without current pathological fracture: Secondary | ICD-10-CM | POA: Insufficient documentation

## 2023-02-12 DIAGNOSIS — M0579 Rheumatoid arthritis with rheumatoid factor of multiple sites without organ or systems involvement: Secondary | ICD-10-CM

## 2023-02-12 DIAGNOSIS — Z8546 Personal history of malignant neoplasm of prostate: Secondary | ICD-10-CM | POA: Insufficient documentation

## 2023-02-12 DIAGNOSIS — M1A09X0 Idiopathic chronic gout, multiple sites, without tophus (tophi): Secondary | ICD-10-CM | POA: Insufficient documentation

## 2023-02-12 DIAGNOSIS — M170 Bilateral primary osteoarthritis of knee: Secondary | ICD-10-CM

## 2023-02-12 DIAGNOSIS — M19072 Primary osteoarthritis, left ankle and foot: Secondary | ICD-10-CM | POA: Insufficient documentation

## 2023-02-12 DIAGNOSIS — M19041 Primary osteoarthritis, right hand: Secondary | ICD-10-CM | POA: Diagnosis not present

## 2023-02-12 DIAGNOSIS — Z8719 Personal history of other diseases of the digestive system: Secondary | ICD-10-CM

## 2023-02-12 DIAGNOSIS — Z8709 Personal history of other diseases of the respiratory system: Secondary | ICD-10-CM | POA: Insufficient documentation

## 2023-02-12 DIAGNOSIS — Z86010 Personal history of colonic polyps: Secondary | ICD-10-CM | POA: Diagnosis not present

## 2023-02-12 DIAGNOSIS — M19042 Primary osteoarthritis, left hand: Secondary | ICD-10-CM | POA: Insufficient documentation

## 2023-02-12 DIAGNOSIS — Z79899 Other long term (current) drug therapy: Secondary | ICD-10-CM | POA: Insufficient documentation

## 2023-02-12 DIAGNOSIS — Z86718 Personal history of other venous thrombosis and embolism: Secondary | ICD-10-CM | POA: Insufficient documentation

## 2023-02-12 DIAGNOSIS — Z8679 Personal history of other diseases of the circulatory system: Secondary | ICD-10-CM | POA: Diagnosis not present

## 2023-02-12 DIAGNOSIS — Z8669 Personal history of other diseases of the nervous system and sense organs: Secondary | ICD-10-CM | POA: Diagnosis not present

## 2023-02-12 DIAGNOSIS — M19071 Primary osteoarthritis, right ankle and foot: Secondary | ICD-10-CM | POA: Diagnosis not present

## 2023-02-12 LAB — QUANTIFERON-TB GOLD PLUS (RQFGPL)
QuantiFERON Mitogen Value: >10 IU/mL
QuantiFERON Nil Value: 0.01 IU/mL
QuantiFERON TB1 Ag Value: 0.02 IU/mL
QuantiFERON TB2 Ag Value: 0.03 IU/mL

## 2023-02-12 LAB — QUANTIFERON-TB GOLD PLUS: QuantiFERON-TB Gold Plus: NEGATIVE

## 2023-02-12 MED ORDER — ALLOPURINOL 100 MG PO TABS: 100.0000 mg | ORAL_TABLET | Freq: Two times a day (BID) | ORAL | 0 refills | Status: AC

## 2023-02-12 MED ORDER — LEFLUNOMIDE 20 MG PO TABS: 20.0000 mg | ORAL_TABLET | Freq: Every day | ORAL | 0 refills | Status: DC

## 2023-02-13 NOTE — Progress Notes (Signed)
TB gold negative

## 2023-02-19 DIAGNOSIS — M8589 Other specified disorders of bone density and structure, multiple sites: Secondary | ICD-10-CM | POA: Diagnosis not present

## 2023-02-19 LAB — HM DEXA SCAN

## 2023-02-26 ENCOUNTER — Telehealth: Payer: Self-pay | Admitting: *Deleted

## 2023-02-26 NOTE — Telephone Encounter (Signed)
Received DEXA results from [facility].  Date of Scan: 02/19/2023  Lowest T-score:-2.2  [id number]  Lowest site measured:Left Femoral Neck  DX: Osteopenia  Significant changes in BMD and site measured (5% and above):12 % Left Total Femur   Current Regimen:Fosamax (started 2018), Vitamin D   Recommendation:Discontinue Fosamax. Drug holiday for 2 years. Continue Calcium and Vitamin D. Repeat DEXA in 2 years.   Reviewed by: Dr. Bo Merino and Hazel Sams, PA-C  Next Appointment:  05/14/2023

## 2023-02-26 NOTE — Telephone Encounter (Signed)
Patient advised of results and recommendations. Patient expressed understanding.

## 2023-03-07 ENCOUNTER — Ambulatory Visit (HOSPITAL_COMMUNITY)
Admission: RE | Admit: 2023-03-07 | Discharge: 2023-03-07 | Disposition: A | Discharge status: Home / Self Care | Payer: Medicare Other | Source: Ambulatory Visit | Attending: Rheumatology | Admitting: Rheumatology

## 2023-03-07 DIAGNOSIS — Z79899 Other long term (current) drug therapy: Secondary | ICD-10-CM | POA: Diagnosis not present

## 2023-03-07 DIAGNOSIS — M0579 Rheumatoid arthritis with rheumatoid factor of multiple sites without organ or systems involvement: Secondary | ICD-10-CM | POA: Insufficient documentation

## 2023-03-07 LAB — CBC WITH DIFFERENTIAL/PLATELET
Abs Immature Granulocytes: 0.02 10*3/uL (ref 0.00–0.07)
Basophils Absolute: 0.1 10*3/uL (ref 0.0–0.1)
Basophils Relative: 1 %
Eosinophils Absolute: 0.4 10*3/uL (ref 0.0–0.5)
Eosinophils Relative: 7 %
HCT: 41.4 % (ref 39.0–52.0)
Hemoglobin: 13.6 g/dL (ref 13.0–17.0)
Immature Granulocytes: 0 %
Lymphocytes Relative: 17 %
Lymphs Abs: 1 10*3/uL (ref 0.7–4.0)
MCH: 29.4 pg (ref 26.0–34.0)
MCHC: 32.9 g/dL (ref 30.0–36.0)
MCV: 89.4 fL (ref 80.0–100.0)
Monocytes Absolute: 0.8 10*3/uL (ref 0.1–1.0)
Monocytes Relative: 14 %
Neutro Abs: 3.6 10*3/uL (ref 1.7–7.7)
Neutrophils Relative %: 61 %
Platelets: 154 10*3/uL (ref 150–400)
RBC: 4.63 MIL/uL (ref 4.22–5.81)
RDW: 14.4 % (ref 11.5–15.5)
WBC: 5.9 10*3/uL (ref 4.0–10.5)
nRBC: 0 % (ref 0.0–0.2)

## 2023-03-07 LAB — COMPREHENSIVE METABOLIC PANEL
ALT: 28 U/L (ref 0–44)
AST: 41 U/L (ref 15–41)
Albumin: 3.9 g/dL (ref 3.5–5.0)
Alkaline Phosphatase: 61 U/L (ref 38–126)
Anion gap: 8 (ref 5–15)
BUN: 15 mg/dL (ref 8–23)
CO2: 29 mmol/L (ref 22–32)
Calcium: 8.9 mg/dL (ref 8.9–10.3)
Chloride: 101 mmol/L (ref 98–111)
Creatinine, Ser: 1.47 mg/dL — ABNORMAL HIGH (ref 0.61–1.24)
GFR, Estimated: 50 mL/min — ABNORMAL LOW (ref >60)
Glucose, Bld: 105 mg/dL — ABNORMAL HIGH (ref 70–99)
Potassium: 3.8 mmol/L (ref 3.5–5.1)
Sodium: 138 mmol/L (ref 135–145)
Total Bilirubin: 0.5 mg/dL (ref 0.3–1.2)
Total Protein: 6.1 g/dL — ABNORMAL LOW (ref 6.5–8.1)

## 2023-03-07 MED ORDER — DIPHENHYDRAMINE HCL 25 MG PO CAPS: 25.0000 mg | ORAL_CAPSULE | ORAL | Status: DC

## 2023-03-07 MED ORDER — TOCILIZUMAB 400 MG/20ML IV SOLN
4.0000 mg/kg | INTRAVENOUS | Status: DC
  Administered 2023-03-07: 402 mg via INTRAVENOUS
  Filled 2023-03-07: qty 20.1

## 2023-03-07 MED ORDER — ACETAMINOPHEN 325 MG PO TABS: 650.0000 mg | ORAL_TABLET | ORAL | Status: DC

## 2023-03-07 NOTE — Progress Notes (Signed)
Creatinine is elevated and stable.

## 2023-03-07 NOTE — Progress Notes (Signed)
CBC WNL

## 2023-03-14 ENCOUNTER — Other Ambulatory Visit: Payer: Self-pay

## 2023-03-14 ENCOUNTER — Other Ambulatory Visit: Payer: Medicare Other

## 2023-03-14 DIAGNOSIS — C61 Malignant neoplasm of prostate: Secondary | ICD-10-CM

## 2023-03-14 DIAGNOSIS — Z7901 Long term (current) use of anticoagulants: Secondary | ICD-10-CM | POA: Diagnosis not present

## 2023-03-15 LAB — PSA: Prostate Specific Ag, Serum: <0.1 ng/mL (ref 0.0–4.0)

## 2023-03-18 ENCOUNTER — Encounter: Payer: Self-pay | Admitting: Physician Assistant

## 2023-03-21 ENCOUNTER — Ambulatory Visit (INDEPENDENT_AMBULATORY_CARE_PROVIDER_SITE_OTHER): Payer: Medicare Other | Admitting: Urology

## 2023-03-21 ENCOUNTER — Encounter: Payer: Self-pay | Admitting: Urology

## 2023-03-21 VITALS — BP 151/77 | HR 73

## 2023-03-21 DIAGNOSIS — N5231 Erectile dysfunction following radical prostatectomy: Secondary | ICD-10-CM | POA: Diagnosis not present

## 2023-03-21 DIAGNOSIS — C61 Malignant neoplasm of prostate: Secondary | ICD-10-CM | POA: Diagnosis not present

## 2023-03-21 DIAGNOSIS — N3281 Overactive bladder: Secondary | ICD-10-CM

## 2023-03-21 LAB — URINALYSIS, ROUTINE W REFLEX MICROSCOPIC
Bilirubin, UA: NEGATIVE
Glucose, UA: NEGATIVE
Ketones, UA: NEGATIVE
Leukocytes,UA: NEGATIVE
Nitrite, UA: NEGATIVE
RBC, UA: NEGATIVE
Specific Gravity, UA: 1.02 (ref 1.005–1.030)
Urobilinogen, Ur: 0.2 mg/dL (ref 0.2–1.0)
pH, UA: 5.5 (ref 5.0–7.5)

## 2023-03-21 MED ORDER — GEMTESA 75 MG PO TABS: 1.0000 | ORAL_TABLET | Freq: Every day | ORAL | 0 refills | Status: DC

## 2023-03-21 NOTE — Patient Instructions (Signed)

## 2023-03-21 NOTE — Progress Notes (Signed)
03/21/2023 10:21 AM   [name] Sep 25, 1951 [id number]  Referring provider: [name], MD [address] [place],  [place] [address]  Followup prostate cancer and erectile dysfunction   HPI: [name] is a 72yo here for followup for prostate cancer and OAB. PSA undetectable. He has an AUS and has occasional urge incontinence. He is on oxybutynin. He uses 3-4 pads per day. No straining to urinate. Urine stream strong. No hematuria or dysuria. 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 Unknown origin.   Shortness of breath dyspnea    exertion    Sleep apnea    cpap    Tubular adenoma of colon 04/2008    Surgical History: Past Surgical History:  Procedure Laterality Date   CHOLECYSTECTOMY     CIRCUMCISION     COLONOSCOPY     CYSTOSCOPY WITH LITHOLAPAXY N/A 03/17/2019   Procedure: CYSTOSCOPY WITH LITHOLAPAXY AND REMOVAL OF FOREIGN BODY;  Surgeon: [name], MD;  Location: [facility];  Service: Urology;  Laterality: N/A;   DOPPLER ECHOCARDIOGRAPHY N/A 01-21-2012   TECHNICALLY DIFFICULT. MILD CONCENTRIC LV HYPERTROPHY. LV CAVITY IS SMALL.. EF=> 55%. TRANSMITRAL SPECTRAL FLOW  PATTREN IS SUGGESTIVE OF IMPAIRED LV RELAXATION. RV SYSTOLIC PRESSURE IS [id number]. LEFT ATRIAL SIZE IS NORMAL. AV APPEARS MILDLY SCLEROTIC. NO SIGN VALVE DISEASE NOTED.   LEFT HEART CATH AND CORONARY ANGIOGRAPHY N/A 03/12/2022   Procedure: LEFT HEART CATH AND CORONARY ANGIOGRAPHY;  Surgeon: Sherren Mocha, MD;  Location: [facility];  Service: Cardiovascular;  Laterality: N/A;   LYMPHADENECTOMY Bilateral 08/30/2015   Procedure: PELVIC LYMPHADENECTOMY;  Surgeon: [name], MD;  Location: [facility];  Service: Urology;  Laterality: Bilateral;   NUCLEAR STRESS TEST N/A 01-21-2012   NORMAL PATTERN OF PERFUSION IN ALL REGIONS. POST STRESS LV SIZE IS NORMAL. NO EVIDENCE OF INDUCIBLE ISCHEMIA. EF 54%.   POLYPECTOMY     PROSTATE BIOPSY     ROBOT ASSISTED LAPAROSCOPIC RADICAL PROSTATECTOMY N/A 08/30/2015   Procedure: ROBOTIC ASSISTED LAPAROSCOPIC RADICAL PROSTATECTOMY;  Surgeon: [name], MD;  Location: [facility];  Service: Urology;  Laterality: N/A;   URINARY SPHINCTER IMPLANT N/A 03/20/2021   Procedure: ARTIFICIAL URINARY SPHINCTER CYSTOSCOPY;  Surgeon: Bjorn Loser, MD;  Location: [facility];  Service: Urology;  Laterality: N/A;  REQUESTING 90 MINS   US VENOUS LOWER EXT Right 03/07/11   PERSISTENT DVT IN RIGHT LOWER EXT.WITH PERSISTANT VISUALIZATION OF HYPOECHOIC THROMBUS WITHIN THE FEMORAL, PROFUNDA FEMORAL  AND POPLITEAL VEINS. WHEN COMPARED TO PREVIOUS, CLOT IS NO LONGER IDENTIFIED WITH IN THE RIGHT CFV.    Home Medications:  Allergies as of 03/21/2023       Reactions   Orencia [abatacept] Anaphylaxis   Had prostate cancer   Carbamazepine Rash   REACTION: makes drowsy BRAND NAME IS TEGRETOL   Celecoxib Itching, Rash   BRAND NAME IS CELEBREX   Cephalexin Rash   "Severe Rash".  BRAND NAME IS KEFLEX.    Enbrel [etanercept] Rash   Humira [adalimumab] Rash   Levofloxacin Other (See Comments)   REACTION: GI Intolerance BRAND NAME IS LEVAQUIN   Sulfa Antibiotics Rash    Sulfasalazine Rash        Medication List        Accurate as of March 21, 2023 10:21 AM. If you have any questions, ask your nurse or doctor.          acetaminophen 650 MG CR tablet Commonly known as: TYLENOL Take 1,300 mg by mouth every 8 (eight) hours as needed for pain.   albuterol (2.5 MG/3ML) 0.083% nebulizer solution Commonly known as: PROVENTIL Take 3 mLs (2.5 mg total) by nebulization every 2 (two) hours as needed for wheezing or shortness of breath.   allopurinol 100 MG tablet Commonly known as: ZYLOPRIM Take 1 tablet (100 mg total) by mouth 2 (two) times daily.   ascorbic acid 500 MG tablet Commonly known as: VITAMIN C Take 500 mg by mouth daily.   Breo Ellipta 50-25 MCG/ACT Aepb Generic drug: Fluticasone Furoate-Vilanterol Inhale 1 Inhalation into the lungs 2 (two) times daily.   cholecalciferol 25 MCG (1000 UNIT) tablet Commonly known as: VITAMIN D3 Take 1,000 Units by mouth daily.   dextromethorphan-guaiFENesin 30-600 MG 12hr tablet Commonly known as: MUCINEX DM Take 1 tablet by mouth 2 (two) times daily.   diclofenac Sodium 1 % Gel Commonly known as: VOLTAREN Apply 2-4 grams to affected joint 4 times daily as needed. What changed: additional instructions   dicyclomine 10 MG capsule Commonly known as: BENTYL TAKE 1 CAPSULE BY MOUTH 4 TIMES DAILY BEFORE MEALS AND AT BEDTIME What changed: See the new instructions.   folic acid 1 MG tablet Commonly known as: FOLVITE TAKE 2 TABLETS BY MOUTH EVERY MORNING   furosemide 80 MG tablet Commonly known as: LASIX TAKE 1 TABLET BY MOUTH EVERY DAY   Incruse Ellipta 62.5 MCG/ACT Aepb Generic drug: umeclidinium bromide INHALE 1 PUFF BY MOUTH EVERY DAY   leflunomide 20 MG tablet Commonly known as: ARAVA Take 1 tablet (20 mg total) by mouth daily.   omeprazole 40 MG capsule Commonly known as: PRILOSEC Take 1 capsule (40 mg total) by mouth daily.   oxybutynin 10 MG 24 hr tablet Commonly known as:  DITROPAN-XL Take 1 tablet (10 mg total) by mouth daily.   phenytoin 100 MG ER capsule Commonly known as: DILANTIN Take 100-200 mg by mouth See admin instructions. Take one capsule in the morning and 2 capsules at bedtime   Potassium Chloride ER 20 MEQ Tbcr Take 20 mEq by mouth daily.   rosuvastatin 10 MG tablet Commonly known as: CRESTOR Take 10 mg by mouth daily.   spironolactone 50 MG tablet Commonly known as: ALDACTONE Take 1 tablet (50 mg total) by mouth daily.   traZODone 50 MG tablet Commonly known as: DESYREL Take 1 tablet (50 mg total) by mouth at bedtime as needed for up to 5 days for sleep.   Trelegy Ellipta 100-62.5-25 MCG/ACT Aepb Generic drug:  Fluticasone-Umeclidin-Vilant Inhale 1 puff into the lungs daily.   warfarin 3 MG tablet Commonly known as: COUMADIN Take 1 tablet (3 mg total) by mouth at bedtime. Restart on 3/24 (48 hours after surgery)        Allergies:  Allergies  Allergen Reactions   Orencia [Abatacept] Anaphylaxis    Had prostate cancer   Carbamazepine Rash    REACTION: makes drowsy BRAND NAME IS TEGRETOL   Celecoxib Itching and Rash    BRAND NAME IS CELEBREX   Cephalexin Rash    "Severe Rash".  BRAND NAME IS KEFLEX.    Enbrel [Etanercept] Rash   Humira [Adalimumab] Rash   Levofloxacin Other (See Comments)    REACTION: GI Intolerance BRAND NAME IS LEVAQUIN   Sulfa Antibiotics Rash   Sulfasalazine Rash    Family History: Family History  Problem Relation Age of Onset   Skin cancer Father    Stomach cancer Father    Heart attack Father    Alzheimer's disease Mother    Throat cancer Brother    Stomach cancer Brother    Lung cancer Brother    Heart attack Brother    Heart attack Brother    Breast cancer Sister    Heart attack Sister    Stroke Sister    Bladder Cancer Sister    Heart murmur Sister    Colon cancer Brother    Colon polyps Neg Hx     Social History:  reports that he quit smoking about 26 years ago. His  smoking use included cigarettes. He has a 60.00 pack-year smoking history. He has never been exposed to tobacco smoke. He has never used smokeless tobacco. He reports that he does not drink alcohol and does not use drugs.  ROS: All other review of systems were reviewed and are negative except what is noted above in HPI  Physical Exam: BP (!) 151/77   Pulse 73   Constitutional:  Alert and oriented, No acute distress. HEENT: [place] AT, moist mucus membranes.  Trachea midline, no masses. Cardiovascular: No clubbing, cyanosis, or edema. Respiratory: Normal respiratory effort, no increased work of breathing. GI: Abdomen is soft, nontender, nondistended, no abdominal masses GU: No CVA tenderness.  Lymph: No cervical or inguinal lymphadenopathy. Skin: No rashes, bruises or suspicious lesions. Neurologic: Grossly intact, no focal deficits, moving all 4 extremities. Psychiatric: Normal mood and affect.  Laboratory Data: Lab Results  Component Value Date   WBC 5.9 03/07/2023   HGB 13.6 03/07/2023   HCT 41.4 03/07/2023   MCV 89.4 03/07/2023   PLT 154 03/07/2023    Lab Results  Component Value Date   CREATININE 1.47 (H) 03/07/2023    Lab Results  Component Value Date   PSA <0.1 02/16/2020    No results found for: "TESTOSTERONE"  No results found for: "HGBA1C"  Urinalysis    Component Value Date/Time   COLORURINE YELLOW 01/23/2023 [place] 01/23/2023 1131   APPEARANCEUR Clear 03/15/2022 1509   LABSPEC 1.014 01/23/2023 1131   PHURINE 5.0 01/23/2023 [place] 01/23/2023 [place] 01/23/2023 1131   [place] 01/23/2023 1131   BILIRUBINUR Negative 03/15/2022 1509   KETONESUR NEGATIVE 01/23/2023 1131   PROTEINUR NEGATIVE 01/23/2023 1131   UROBILINOGEN 0.2 09/08/2020 1326   UROBILINOGEN 0.2 09/24/2012 1640   NITRITE NEGATIVE 01/23/2023 1131   LEUKOCYTESUR NEGATIVE 01/23/2023 1131    Lab Results  Component Value Date    LABMICR Comment 03/15/2022   BACTERIA  NONE SEEN 11/29/2018    Pertinent Imaging:  Results for orders placed during the hospital encounter of 05/27/13  DG Abd 1 View  Narrative *RADIOLOGY REPORT*  Clinical Data: The mid to lower abdominal pain for 1 month with some nausea and constipation  ABDOMEN - 1 VIEW  Comparison: 03/15/2011, abdomen CT  Findings: The bowel gas pattern is within normal limits.  There is no significant increased stool.  There are changes from a prior cholecystectomy.  The soft tissues are otherwise unremarkable.  No significant bony abnormality.  IMPRESSION: No acute findings.  No obstruction.  No significant increase in stool.   Original Report Authenticated By: Lajean Manes, M.D.  Results for orders placed during the hospital encounter of 09/03/07  US VenoUS Imaging Bilateral  Narrative Clinical Data: Bilateral lower extremity edema.  History of DVT.  Increased d-dimer. BILATERAL LOWER EXTREMITY VENOUS DOPPLER ULTRASOUND: Technique:  Gray-scale sonography with compression, as well as color and duplex Doppler ultrasound, were performed to evaluate the deep venous system from the level of the common femoral vein through the popliteal and proximal calf veins. Findings:  There is no evidence of acute DVT.  Normal compressibility phasicity and augmentation of the deep veins bilaterally.  There is some residual thrombus in the left popliteal and left posterior tibial veins.  Patient had findings of an acute DVT in the left popliteal and posterior tibial veins as noted on the 04/29/05 exam.  Impression Negative for acute DVT involving the lower extremities.  Provider: Mertie Clause  No results found for this or any previous visit.  No results found for this or any previous visit.  No results found for this or any previous visit.  No valid procedures specified. No results found for this or any previous visit.  No results found for this or any  previous visit.   Assessment & Plan:    1. Prostate cancer ([place]) -followup 1 year with PSA - Urinalysis, Routine w reflex microscopic  2. Erectile dysfunction after radical prostatectomy Patient defers therapy at this time  3. OAB -we will trial gemtesa 75mg  daily   No follow-ups on file.  Nicolette Bang, MD  [facility] [place]

## 2023-04-04 ENCOUNTER — Other Ambulatory Visit: Payer: Self-pay | Admitting: Pharmacist

## 2023-04-04 ENCOUNTER — Encounter (HOSPITAL_COMMUNITY)
Admission: RE | Admit: 2023-04-04 | Discharge: 2023-04-04 | Disposition: A | Discharge status: Home / Self Care | Payer: Medicare Other | Source: Ambulatory Visit | Attending: Rheumatology | Admitting: Rheumatology

## 2023-04-04 ENCOUNTER — Other Ambulatory Visit: Payer: Self-pay | Admitting: Physician Assistant

## 2023-04-04 DIAGNOSIS — M0579 Rheumatoid arthritis with rheumatoid factor of multiple sites without organ or systems involvement: Secondary | ICD-10-CM | POA: Insufficient documentation

## 2023-04-04 DIAGNOSIS — Z79899 Other long term (current) drug therapy: Secondary | ICD-10-CM | POA: Insufficient documentation

## 2023-04-04 MED ORDER — DIPHENHYDRAMINE HCL 25 MG PO CAPS: 25.0000 mg | ORAL_CAPSULE | ORAL | Status: DC

## 2023-04-04 MED ORDER — ACETAMINOPHEN 325 MG PO TABS: 650.0000 mg | ORAL_TABLET | ORAL | Status: DC

## 2023-04-04 MED ORDER — TOCILIZUMAB 400 MG/20ML IV SOLN
4.0000 mg/kg | INTRAVENOUS | Status: DC
  Administered 2023-04-04: 402 mg via INTRAVENOUS
  Filled 2023-04-04: qty 20.1

## 2023-04-04 NOTE — Progress Notes (Signed)
Next infusion scheduled for Actemra IV on 05/02/23 and due for updated orders. Diagnosis: RA - also on leflunomide  daily  Dose: 4 mg/kg every 4 weeks  Last Clinic Visit: 02/12/23 Next Clinic Visit: 05/14/23  Last infusion: 04/04/2023  Labs: CBC and CMP on 03/07/23 - creatinine elevated, CBC wnl TB Gold: negative on 02/07/23   Orders placed for Actemra IV x 3 doses along with premedication of acetaminophen and diphenhydramine to be administered 30 minutes before medication infusion.  Standing CBC with diff/platelet and CMP with GFR orders placed to be drawn every 2 months.  Next TB gold due 02/08/2024  Chesley Mires, PharmD, MPH, BCPS, CPP Clinical Pharmacist (Rheumatology and Pulmonology)

## 2023-04-25 DIAGNOSIS — J441 Chronic obstructive pulmonary disease with (acute) exacerbation: Secondary | ICD-10-CM | POA: Diagnosis not present

## 2023-04-30 NOTE — Progress Notes (Unsigned)
Office Visit Note  Patient: [name]             Date of Birth: 1951/05/24           MRN: [id number]             PCP: Assunta Found, MD Referring: Assunta Found, MD Visit Date: 05/14/2023 Occupation: @GUAROCC @  Subjective:  Medication monitoring   History of Present Illness: [name] is a 72 y.o. male with history of seropositive rheumatoid arthritis, gout, osteoarthritis, and osteoporosis.   He remains on Actemra 4mg /kg IV infusions every 4 weeks and Arava 20 mg 1 tablet by mouth daily.  His last Actemra infusion was administered on 05/02/2023.  He continues to tolerate Actemra and Arava as combination therapy.  He denies any recent rheumatoid arthritis flares.  He states he is having some increased pain and stiffness in his C-spine but denies any symptoms of radiculopathy.  He has a heating pad but has not tried any other topical agents for relief.  He states his lower back pain has been manageable. Patient had an updated bone density on 02/19/2023.  He was advised to discontinue the use of Fosamax since he was taking Fosamax since 2018.  He discontinued Fosamax about 1 month ago.  He continues to take vitamin D 1000 units daily. He denies any signs or symptoms of a gout flare.  He is taking allopurinol 200 mg daily. He denies any new medical conditions.  He denies any recent or recurrent infections.     Activities of Daily Living:  Patient reports morning stiffness for all day. Patient Reports nocturnal pain.  Difficulty dressing/grooming: Denies Difficulty climbing stairs: Denies Difficulty getting out of chair: Denies Difficulty using hands for taps, buttons, cutlery, and/or writing: Denies  Review of Systems  Constitutional:  Negative for fatigue.  HENT:  Positive for mouth dryness. Negative for mouth sores.   Eyes:  Positive for dryness.  Respiratory:  Negative for shortness of breath.   Cardiovascular:  Negative for chest pain and palpitations.   Gastrointestinal:  Positive for constipation. Negative for blood in stool and diarrhea.  Endocrine: Negative for increased urination.  Genitourinary:  Negative for involuntary urination.  Musculoskeletal:  Positive for joint pain, joint pain and morning stiffness. Negative for gait problem, joint swelling, myalgias, muscle weakness, muscle tenderness and myalgias.  Skin:  Negative for color change, rash and sensitivity to sunlight.  Allergic/Immunologic: Negative for susceptible to infections.  Neurological:  Negative for dizziness and headaches.  Hematological:  Negative for swollen glands.  Psychiatric/Behavioral:  Negative for depressed mood and sleep disturbance. The patient is not nervous/anxious.     PMFS History:  Patient Active Problem List   Diagnosis Date Noted   Acute respiratory failure with hypoxia (HCC) 01/23/2023   Diaphragm paralysis 08/28/2022   Chest pain 03/11/2022   Stage 3a chronic kidney disease (CKD) (HCC) 03/11/2022   Bronchiectasis without complication (HCC) 03/26/2021   Incontinence when straining, male 03/20/2021   Erectile dysfunction after radical prostatectomy 09/08/2020   OAB (overactive bladder) 02/23/2020   Class 2 severe obesity due to excess calories with serious comorbidity and body mass index (BMI) of 35.0 to 35.9 in adult [facility])    Chronic diastolic HF (heart failure) (HCC)    Serratia sepsis (HCC) 06/08/2019   Voice hoarseness 04/26/2019   OSA (obstructive sleep apnea) 03/10/2019   Abnormal CT of the chest 02/09/2019   Dyspnea on exertion 10/22/2017   History of seizure disorder 02/27/2017  Primary osteoarthritis of both hands 02/27/2017   Primary osteoarthritis of both feet 02/27/2017   Idiopathic gout of multiple sites 02/27/2017   High risk medication use 12/29/2016   History of prostate cancer 12/29/2016   Primary osteoarthritis of both knees 12/29/2016   Chronic idiopathic gout involving toe without tophus 12/29/2016   History of CHF  (congestive heart failure) 12/29/2016   History of COPD 12/29/2016   Plantar pustular psoriasis 12/29/2016   DVT (deep venous thrombosis) (HCC) 10/31/2016   COPD with acute exacerbation (HCC) 10/31/2016   CHF (congestive heart failure) (HCC) 10/31/2016   Prostate cancer (HCC) 08/30/2015   Malignant neoplasm of prostate (HCC) 07/04/2015   Chest pain at rest 07/05/2014   Weakness 07/05/2014   Complicated postphlebitic syndrome 11/16/2013   Personal history of DVT (deep vein thrombosis) 05/18/2013   Chronic anticoagulation 05/18/2013   Diverticulosis of colon without hemorrhage 05/18/2013   Rheumatoid arthritis (HCC) 05/18/2013   Seizure disorder (HCC) 05/18/2013   Hx of adenomatous colonic polyps 05/18/2013   GERD 05/08/2010   NAUSEA 05/08/2010   FLATULENCE-GAS-BLOATING 05/08/2010    Past Medical History:  Diagnosis Date   Arthritis    RA   Asthma    CHF (congestive heart failure) (HCC)    pt denies    Clotting disorder (HCC)    DVT both legs    COPD (chronic obstructive pulmonary disease) (HCC)    DDD (degenerative disc disease), cervical    with UE's paresthesias   Diverticulosis    DVT, lower extremity (HCC)    bilat   Eye abnormality    right eye drifts has difficulty focusing 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 Unknown origin.   Shortness of breath dyspnea    exertion    Sleep apnea    cpap    Tubular adenoma of colon 04/2008    Family History  Problem Relation Age of Onset   Skin cancer Father    Stomach cancer Father    Heart attack Father    Alzheimer's disease Mother    Throat cancer Brother    Stomach  cancer Brother    Lung cancer Brother    Heart attack Brother    Heart attack Brother    Breast cancer Sister    Heart attack Sister    Stroke Sister    Bladder Cancer Sister    Heart murmur Sister    Colon cancer Brother    Colon polyps Neg Hx    Past Surgical History:  Procedure Laterality Date   CHOLECYSTECTOMY     CIRCUMCISION     COLONOSCOPY     CYSTOSCOPY WITH LITHOLAPAXY N/A 03/17/2019   Procedure: CYSTOSCOPY WITH LITHOLAPAXY AND REMOVAL OF FOREIGN BODY;  Surgeon: Malen Gauze, MD;  Location: [facility];  Service: Urology;  Laterality: N/A;   DOPPLER ECHOCARDIOGRAPHY N/A 01-21-2012   TECHNICALLY DIFFICULT. MILD CONCENTRIC LV HYPERTROPHY. LV CAVITY IS SMALL.. EF=> 55%. TRANSMITRAL SPECTRAL FLOW PATTREN IS SUGGESTIVE OF IMPAIRED LV RELAXATION. RV SYSTOLIC PRESSURE IS . LEFT  ATRIAL SIZE IS NORMAL. AV APPEARS MILDLY SCLEROTIC. NO SIGN VALVE DISEASE NOTED.   LEFT HEART CATH AND CORONARY ANGIOGRAPHY N/A 03/12/2022   Procedure: LEFT HEART CATH AND CORONARY ANGIOGRAPHY;  Surgeon: Tonny Bollman, MD;  Location: [facility] INVASIVE CV LAB;  Service: Cardiovascular;  Laterality: N/A;   LYMPHADENECTOMY Bilateral 08/30/2015   Procedure: PELVIC LYMPHADENECTOMY;  Surgeon: Malen Gauze, MD;  Location: [facility];  Service: Urology;  Laterality: Bilateral;   NUCLEAR STRESS TEST N/A 01-21-2012   NORMAL PATTERN OF PERFUSION IN ALL REGIONS. POST STRESS LV SIZE IS NORMAL. NO EVIDENCE OF INDUCIBLE ISCHEMIA. EF 54%.   POLYPECTOMY     PROSTATE BIOPSY     ROBOT ASSISTED LAPAROSCOPIC RADICAL PROSTATECTOMY N/A 08/30/2015   Procedure: ROBOTIC ASSISTED LAPAROSCOPIC RADICAL PROSTATECTOMY;  Surgeon: Malen Gauze, MD;  Location: [facility];  Service: Urology;  Laterality: N/A;   URINARY SPHINCTER IMPLANT N/A 03/20/2021   Procedure: ARTIFICIAL URINARY SPHINCTER CYSTOSCOPY;  Surgeon: Alfredo Martinez, MD;  Location: [facility];  Service: Urology;  Laterality: N/A;  REQUESTING 90 MINS   US VENOUS LOWER EXT Right  03/07/11   PERSISTENT DVT IN RIGHT LOWER EXT.WITH PERSISTANT VISUALIZATION OF HYPOECHOIC THROMBUS WITHIN THE FEMORAL, PROFUNDA FEMORAL AND POPLITEAL VEINS. WHEN COMPARED TO PREVIOUS, CLOT IS NO LONGER IDENTIFIED WITH IN THE RIGHT CFV.   Social History   Social History Narrative   Not on file   Immunization History  Administered Date(s) Administered   Fluad Quad(high Dose 65+) 09/30/2019   Influenza, High Dose Seasonal PF 10/05/2018, 10/10/2021   Influenza,inj,Quad PF,6+ Mos 08/31/2015   Influenza-Unspecified 09/30/2016, 08/22/2017   Moderna Sars-Covid-2 Vaccination 01/21/2020, 02/18/2020, 10/25/2020, 10/10/2021   Pneumococcal Conjugate-13 10/05/2018   Pneumococcal-Unspecified 12/30/2012   Td 07/05/1996     Objective: Vital Signs: BP (!) 177/75 (BP Location: Left Arm, Patient Position: Sitting, Cuff Size: Normal)   Pulse 63   Resp 15   Ht 5\' 9"  (1.753 m)   Wt 221 lb 12.8 oz (100.6 kg)   BMI 32.75 kg/m    Physical Exam Vitals and nursing note reviewed.  Constitutional:      Appearance: He is well-developed.  HENT:     Head: Normocephalic and atraumatic.  Eyes:     Conjunctiva/sclera: Conjunctivae normal.     Pupils: Pupils are equal, round, and reactive to light.  Cardiovascular:     Rate and Rhythm: Normal rate and regular rhythm.     Heart sounds: Normal heart sounds.  Pulmonary:     Effort: Pulmonary effort is normal.     Breath sounds: Normal breath sounds.  Abdominal:     General: Bowel sounds are normal.     Palpations: Abdomen is soft.  Musculoskeletal:     Cervical back: Normal range of motion and neck supple.  Skin:    General: Skin is warm and dry.     Capillary Refill: Capillary refill takes less than 2 seconds.  Neurological:     Mental Status: He is alert and oriented to person, place, and time.  Psychiatric:        Behavior: Behavior normal.      Musculoskeletal Exam: C-spine limited ROM with lateral rotation with discomfort.  Postural thoracic  kyphosis.  Shoulder joints, elbow joints, wrist joints, MCPs, PIPs, and DIPs good ROM with no synovitis.  Complete fist formation bilaterally.  PIP ad DIP thickening.  Hip joints have slightly limited ROM but no groin pain.  Knee joints have good ROM with no warmth or effusion.  Ankle joints  have good ROM with no tenderness or swelling.  Pitting edema noted bilaterally.    CDAI Exam: CDAI Score: -- Patient Global: 1 mm; Provider Global: 1 mm Swollen: --; Tender: -- Joint Exam 05/14/2023   No joint exam has been documented for this visit   There is currently no information documented on the homunculus. Go to the Rheumatology activity and complete the homunculus joint exam.  Investigation: No additional findings.  Imaging: No results found.  Recent Labs: Lab Results  Component Value Date   WBC 6.9 05/02/2023   HGB 13.8 05/02/2023   PLT 152 05/02/2023   NA 139 05/02/2023   K 4.1 05/02/2023   CL 99 05/02/2023   CO2 30 05/02/2023   GLUCOSE 120 (H) 05/02/2023   BUN 18 05/02/2023   CREATININE 1.76 (H) 05/02/2023   BILITOT 0.3 05/02/2023   ALKPHOS 54 05/02/2023   AST 36 05/02/2023   ALT 26 05/02/2023   PROT 6.4 (L) 05/02/2023   ALBUMIN 4.0 05/02/2023   CALCIUM 8.7 (L) 05/02/2023   GFRAA >60 09/20/2020   QFTBGOLDPLUS Negative 02/07/2023    Speciality Comments: ACTEMRA 4mg /kg x 4 weeks PPD negative 01/02/17  Procedures:  No procedures performed Allergies: Orencia [abatacept], Carbamazepine, Celecoxib, Cephalexin, Enbrel [etanercept], Humira [adalimumab], Levofloxacin, Sulfa antibiotics, and Sulfasalazine     Assessment / Plan:     Visit Diagnoses: Rheumatoid arthritis involving multiple sites with positive rheumatoid factor (HCC): He has no synovitis on examination today.  He has not had any signs or symptoms of a rheumatoid arthritis flare.  He has clinically been doing well on Actemra 4 mg/kg IV infusions every 4 weeks and Arava 20 mg 1 tablet by mouth daily.  He is  tolerating combination therapy without any side effects.  He has not had any recent or recurrent infections.  He is having some increased discomfort and stiffness in the C-spine but has no other joint pain or inflammation at this time.  He will remain on combination therapy as prescribed.  He was advised to notify us if he develops signs or symptoms of a rheumatoid arthritis flare.  Will follow-up in the office in 3 months or sooner if needed.  High risk medication use - Actemra 4mg /kg IV infusions every 4 weeks and Arava 20 mg 1 tablet by mouth daily. CBC and CMP updated on 05/02/23.  He will continue to have lab work drawn with infusions. TB gold negative 02/07/23  No recent or recurrent infections.  Discussed the importance of holding actemra and arava if he develops signs or symptoms of an infection and to resume once the infection has completely cleared  Age-related osteoporosis without current pathological fracture - DEXA updated on 02/19/2023: Left femoral neck BMD 0.632 with T-score -2.2.  Statistically significant increase in the left hip.  Lumbar spine and right hip are stable.  Results were reviewed with the patient today in the office.  Patient was previously taking Fosamax from 2018 until March 2024.  He has discontinued the use of Fosamax for a drug holiday.  He will continue to take vitamin D 1000 units daily.  Recheck DEXA in February 2026. Previous DEXA on 09/28/2019 T-score -2.1 right femur neck with +12 % change in BMD  Idiopathic chronic gout of multiple sites without tophus -He has not had any signs or symptoms of a gout flare.  He has clinically been doing well taking allopurinol 100 mg 2 tablets daily.  Primary osteoarthritis of both hands: He has PIP and DIP thickening consistent  with osteoarthritis of both hands.  No tenderness or inflammation noted on examination today.  Complete fist formation bilaterally.  Primary osteoarthritis of both knees: He has good range of motion of both  knee joints on examination today.  No warmth or effusion noted.  Primary osteoarthritis of both feet: He is not experiencing any increased discomfort in his feet at this time.  Pitting edema noted in bilateral lower extremities.  Other medical conditions are listed as follows:   History of abscessed tooth  History of COPD  History of adenomatous polyp of colon  History of seizure disorder  History of CHF (congestive heart failure)  History of prostate cancer  History of DVT (deep vein thrombosis)  Orders: No orders of the defined types were placed in this encounter.  No orders of the defined types were placed in this encounter.   Follow-Up Instructions: Return in about 3 months (around 08/14/2023) for Rheumatoid arthritis, Osteoarthritis, Gout, Osteoporosis.   Gearldine Bienenstock, PA-C  Note - This record has been created using Dragon software.  Chart creation errors have been sought, but may not always  have been located. Such creation errors do not reflect on  the standard of medical care.

## 2023-05-02 ENCOUNTER — Ambulatory Visit (HOSPITAL_COMMUNITY)
Admission: RE | Admit: 2023-05-02 | Discharge: 2023-05-02 | Disposition: A | Discharge status: Home / Self Care | Payer: Medicare Other | Source: Ambulatory Visit | Attending: Rheumatology | Admitting: Rheumatology

## 2023-05-02 DIAGNOSIS — Z79899 Other long term (current) drug therapy: Secondary | ICD-10-CM | POA: Diagnosis not present

## 2023-05-02 DIAGNOSIS — M0579 Rheumatoid arthritis with rheumatoid factor of multiple sites without organ or systems involvement: Secondary | ICD-10-CM

## 2023-05-02 LAB — CBC WITH DIFFERENTIAL/PLATELET
Abs Immature Granulocytes: 0.02 10*3/uL (ref 0.00–0.07)
Basophils Absolute: 0.1 10*3/uL (ref 0.0–0.1)
Basophils Relative: 2 %
Eosinophils Absolute: 0.6 10*3/uL — ABNORMAL HIGH (ref 0.0–0.5)
Eosinophils Relative: 8 %
HCT: 41.6 % (ref 39.0–52.0)
Hemoglobin: 13.8 g/dL (ref 13.0–17.0)
Immature Granulocytes: 0 %
Lymphocytes Relative: 15 %
Lymphs Abs: 1 10*3/uL (ref 0.7–4.0)
MCH: 29.1 pg (ref 26.0–34.0)
MCHC: 33.2 g/dL (ref 30.0–36.0)
MCV: 87.8 fL (ref 80.0–100.0)
Monocytes Absolute: 1 10*3/uL (ref 0.1–1.0)
Monocytes Relative: 14 %
Neutro Abs: 4.2 10*3/uL (ref 1.7–7.7)
Neutrophils Relative %: 61 %
Platelets: 152 10*3/uL (ref 150–400)
RBC: 4.74 MIL/uL (ref 4.22–5.81)
RDW: 14.5 % (ref 11.5–15.5)
WBC: 6.9 10*3/uL (ref 4.0–10.5)
nRBC: 0 % (ref 0.0–0.2)

## 2023-05-02 LAB — COMPREHENSIVE METABOLIC PANEL
ALT: 26 U/L (ref 0–44)
AST: 36 U/L (ref 15–41)
Albumin: 4 g/dL (ref 3.5–5.0)
Alkaline Phosphatase: 54 U/L (ref 38–126)
Anion gap: 10 (ref 5–15)
BUN: 18 mg/dL (ref 8–23)
CO2: 30 mmol/L (ref 22–32)
Calcium: 8.7 mg/dL — ABNORMAL LOW (ref 8.9–10.3)
Chloride: 99 mmol/L (ref 98–111)
Creatinine, Ser: 1.76 mg/dL — ABNORMAL HIGH (ref 0.61–1.24)
GFR, Estimated: 41 mL/min — ABNORMAL LOW (ref >60)
Glucose, Bld: 120 mg/dL — ABNORMAL HIGH (ref 70–99)
Potassium: 4.1 mmol/L (ref 3.5–5.1)
Sodium: 139 mmol/L (ref 135–145)
Total Bilirubin: 0.3 mg/dL (ref 0.3–1.2)
Total Protein: 6.4 g/dL — ABNORMAL LOW (ref 6.5–8.1)

## 2023-05-02 MED ORDER — DIPHENHYDRAMINE HCL 25 MG PO CAPS: 25.0000 mg | ORAL_CAPSULE | ORAL | Status: DC

## 2023-05-02 MED ORDER — ACETAMINOPHEN 325 MG PO TABS: 650.0000 mg | ORAL_TABLET | ORAL | Status: DC

## 2023-05-02 MED ORDER — TOCILIZUMAB 400 MG/20ML IV SOLN
4.0000 mg/kg | INTRAVENOUS | Status: DC
  Administered 2023-05-02: 418 mg via INTRAVENOUS
  Filled 2023-05-02: qty 4

## 2023-05-02 NOTE — Progress Notes (Signed)
CBC WNL

## 2023-05-02 NOTE — Progress Notes (Signed)
Glucose is 120.  Creatinine is elevated-1.76 and GFR is low-41. Avoid all NSAID use.   Calcium is low-increase dietary calcium intake.

## 2023-05-14 ENCOUNTER — Encounter: Payer: Self-pay | Admitting: Physician Assistant

## 2023-05-14 ENCOUNTER — Encounter: Payer: Medicare Other | Attending: Rheumatology | Admitting: Physician Assistant

## 2023-05-14 VITALS — BP 177/75 | HR 63 | Resp 15 | Ht 69.0 in | Wt 221.8 lb

## 2023-05-14 DIAGNOSIS — Z8669 Personal history of other diseases of the nervous system and sense organs: Secondary | ICD-10-CM | POA: Diagnosis not present

## 2023-05-14 DIAGNOSIS — Z8679 Personal history of other diseases of the circulatory system: Secondary | ICD-10-CM | POA: Insufficient documentation

## 2023-05-14 DIAGNOSIS — M19071 Primary osteoarthritis, right ankle and foot: Secondary | ICD-10-CM | POA: Insufficient documentation

## 2023-05-14 DIAGNOSIS — M0579 Rheumatoid arthritis with rheumatoid factor of multiple sites without organ or systems involvement: Secondary | ICD-10-CM | POA: Diagnosis not present

## 2023-05-14 DIAGNOSIS — M19041 Primary osteoarthritis, right hand: Secondary | ICD-10-CM | POA: Diagnosis not present

## 2023-05-14 DIAGNOSIS — Z86718 Personal history of other venous thrombosis and embolism: Secondary | ICD-10-CM | POA: Diagnosis not present

## 2023-05-14 DIAGNOSIS — Z8709 Personal history of other diseases of the respiratory system: Secondary | ICD-10-CM | POA: Insufficient documentation

## 2023-05-14 DIAGNOSIS — Z8546 Personal history of malignant neoplasm of prostate: Secondary | ICD-10-CM | POA: Diagnosis not present

## 2023-05-14 DIAGNOSIS — M1A09X0 Idiopathic chronic gout, multiple sites, without tophus (tophi): Secondary | ICD-10-CM | POA: Diagnosis not present

## 2023-05-14 DIAGNOSIS — M19072 Primary osteoarthritis, left ankle and foot: Secondary | ICD-10-CM | POA: Diagnosis not present

## 2023-05-14 DIAGNOSIS — M810 Age-related osteoporosis without current pathological fracture: Secondary | ICD-10-CM

## 2023-05-14 DIAGNOSIS — Z86010 Personal history of colonic polyps: Secondary | ICD-10-CM | POA: Insufficient documentation

## 2023-05-14 DIAGNOSIS — M170 Bilateral primary osteoarthritis of knee: Secondary | ICD-10-CM | POA: Diagnosis not present

## 2023-05-14 DIAGNOSIS — M19042 Primary osteoarthritis, left hand: Secondary | ICD-10-CM | POA: Insufficient documentation

## 2023-05-14 DIAGNOSIS — Z79899 Other long term (current) drug therapy: Secondary | ICD-10-CM

## 2023-05-14 DIAGNOSIS — Z8719 Personal history of other diseases of the digestive system: Secondary | ICD-10-CM | POA: Diagnosis not present

## 2023-05-28 ENCOUNTER — Encounter: Payer: Self-pay | Admitting: Gastroenterology

## 2023-05-30 ENCOUNTER — Ambulatory Visit (HOSPITAL_COMMUNITY)
Admission: RE | Admit: 2023-05-30 | Discharge: 2023-05-30 | Disposition: A | Discharge status: Home / Self Care | Payer: Medicare Other | Source: Ambulatory Visit | Attending: Rheumatology | Admitting: Rheumatology

## 2023-05-30 DIAGNOSIS — M0579 Rheumatoid arthritis with rheumatoid factor of multiple sites without organ or systems involvement: Secondary | ICD-10-CM | POA: Insufficient documentation

## 2023-05-30 DIAGNOSIS — Z79899 Other long term (current) drug therapy: Secondary | ICD-10-CM | POA: Diagnosis not present

## 2023-05-30 MED ORDER — ACETAMINOPHEN 325 MG PO TABS: 650.0000 mg | ORAL_TABLET | ORAL | Status: DC

## 2023-05-30 MED ORDER — DIPHENHYDRAMINE HCL 25 MG PO CAPS: 25.0000 mg | ORAL_CAPSULE | ORAL | Status: DC

## 2023-05-30 MED ORDER — TOCILIZUMAB 400 MG/20ML IV SOLN
400.0000 mg | INTRAVENOUS | Status: DC
  Administered 2023-05-30: 400 mg via INTRAVENOUS
  Filled 2023-05-30: qty 20

## 2023-05-31 ENCOUNTER — Other Ambulatory Visit: Payer: Self-pay | Admitting: Physician Assistant

## 2023-06-02 DIAGNOSIS — E6609 Other obesity due to excess calories: Secondary | ICD-10-CM | POA: Diagnosis not present

## 2023-06-02 DIAGNOSIS — W57XXXA Bitten or stung by nonvenomous insect and other nonvenomous arthropods, initial encounter: Secondary | ICD-10-CM | POA: Diagnosis not present

## 2023-06-02 DIAGNOSIS — Z7901 Long term (current) use of anticoagulants: Secondary | ICD-10-CM | POA: Diagnosis not present

## 2023-06-02 DIAGNOSIS — J449 Chronic obstructive pulmonary disease, unspecified: Secondary | ICD-10-CM | POA: Diagnosis not present

## 2023-06-02 DIAGNOSIS — Z6833 Body mass index (BMI) 33.0-33.9, adult: Secondary | ICD-10-CM | POA: Diagnosis not present

## 2023-06-02 DIAGNOSIS — S1086XA Insect bite of other specified part of neck, initial encounter: Secondary | ICD-10-CM | POA: Diagnosis not present

## 2023-06-02 NOTE — Telephone Encounter (Signed)
Last Fill: 02/12/2023  Labs: 05/02/2023  Glucose is 120.  Creatinine is elevated-1.76 and GFR is low-41. Avoid all NSAID use.   Calcium is low-increase dietary calcium intake.  Next Visit: 08/14/2023  Last Visit: 05/14/2023  DX:  Rheumatoid arthritis involving multiple sites with positive rheumatoid factor   Current Dose per office note 05/14/2023: Arava 20 mg 1 tablet by mouth daily   Okay to refill Arava ?

## 2023-06-06 ENCOUNTER — Encounter: Payer: Self-pay | Admitting: Gastroenterology

## 2023-06-08 ENCOUNTER — Other Ambulatory Visit: Payer: Self-pay | Admitting: Cardiovascular Disease

## 2023-06-27 ENCOUNTER — Encounter (HOSPITAL_COMMUNITY): Payer: Medicare Other

## 2023-06-30 ENCOUNTER — Ambulatory Visit (HOSPITAL_COMMUNITY)
Admission: RE | Admit: 2023-06-30 | Discharge: 2023-06-30 | Disposition: A | Discharge status: Home / Self Care | Payer: Medicare Other | Source: Ambulatory Visit | Attending: Rheumatology | Admitting: Rheumatology

## 2023-06-30 VITALS — BP 154/84 | HR 68 | Temp 97.7°F | Resp 17 | Wt 225.0 lb

## 2023-06-30 DIAGNOSIS — M069 Rheumatoid arthritis, unspecified: Secondary | ICD-10-CM | POA: Diagnosis not present

## 2023-06-30 DIAGNOSIS — M19042 Primary osteoarthritis, left hand: Secondary | ICD-10-CM | POA: Insufficient documentation

## 2023-06-30 DIAGNOSIS — M19041 Primary osteoarthritis, right hand: Secondary | ICD-10-CM | POA: Insufficient documentation

## 2023-06-30 DIAGNOSIS — Z79899 Other long term (current) drug therapy: Secondary | ICD-10-CM | POA: Insufficient documentation

## 2023-06-30 DIAGNOSIS — M19071 Primary osteoarthritis, right ankle and foot: Secondary | ICD-10-CM | POA: Diagnosis not present

## 2023-06-30 DIAGNOSIS — M0579 Rheumatoid arthritis with rheumatoid factor of multiple sites without organ or systems involvement: Secondary | ICD-10-CM | POA: Insufficient documentation

## 2023-06-30 DIAGNOSIS — M19072 Primary osteoarthritis, left ankle and foot: Secondary | ICD-10-CM | POA: Diagnosis not present

## 2023-06-30 LAB — COMPREHENSIVE METABOLIC PANEL
ALT: 28 U/L (ref 0–44)
AST: 30 U/L (ref 15–41)
Albumin: 4 g/dL (ref 3.5–5.0)
Alkaline Phosphatase: 55 U/L (ref 38–126)
Anion gap: 9 (ref 5–15)
BUN: 18 mg/dL (ref 8–23)
CO2: 29 mmol/L (ref 22–32)
Calcium: 9 mg/dL (ref 8.9–10.3)
Chloride: 100 mmol/L (ref 98–111)
Creatinine, Ser: 1.51 mg/dL — ABNORMAL HIGH (ref 0.61–1.24)
GFR, Estimated: 49 mL/min — ABNORMAL LOW (ref >60)
Glucose, Bld: 127 mg/dL — ABNORMAL HIGH (ref 70–99)
Potassium: 3.7 mmol/L (ref 3.5–5.1)
Sodium: 138 mmol/L (ref 135–145)
Total Bilirubin: 0.7 mg/dL (ref 0.3–1.2)
Total Protein: 6.6 g/dL (ref 6.5–8.1)

## 2023-06-30 LAB — CBC WITH DIFFERENTIAL/PLATELET
Abs Immature Granulocytes: 0.02 10*3/uL (ref 0.00–0.07)
Basophils Absolute: 0.1 10*3/uL (ref 0.0–0.1)
Basophils Relative: 1 %
Eosinophils Absolute: 0.6 10*3/uL — ABNORMAL HIGH (ref 0.0–0.5)
Eosinophils Relative: 9 %
HCT: 43.4 % (ref 39.0–52.0)
Hemoglobin: 14.2 g/dL (ref 13.0–17.0)
Immature Granulocytes: 0 %
Lymphocytes Relative: 14 %
Lymphs Abs: 0.9 10*3/uL (ref 0.7–4.0)
MCH: 29.6 pg (ref 26.0–34.0)
MCHC: 32.7 g/dL (ref 30.0–36.0)
MCV: 90.4 fL (ref 80.0–100.0)
Monocytes Absolute: 0.9 10*3/uL (ref 0.1–1.0)
Monocytes Relative: 14 %
Neutro Abs: 3.9 10*3/uL (ref 1.7–7.7)
Neutrophils Relative %: 62 %
Platelets: 169 10*3/uL (ref 150–400)
RBC: 4.8 MIL/uL (ref 4.22–5.81)
RDW: 15.5 % (ref 11.5–15.5)
WBC: 6.3 10*3/uL (ref 4.0–10.5)
nRBC: 0 % (ref 0.0–0.2)

## 2023-06-30 MED ORDER — DIPHENHYDRAMINE HCL 25 MG PO CAPS
ORAL_CAPSULE | ORAL | Status: AC
  Filled 2023-06-30: qty 1

## 2023-06-30 MED ORDER — DIPHENHYDRAMINE HCL 25 MG PO CAPS: 25.0000 mg | ORAL_CAPSULE | ORAL | Status: DC

## 2023-06-30 MED ORDER — ACETAMINOPHEN 325 MG PO TABS
ORAL_TABLET | ORAL | Status: AC
  Filled 2023-06-30: qty 2

## 2023-06-30 MED ORDER — ACETAMINOPHEN 325 MG PO TABS: 650.0000 mg | ORAL_TABLET | ORAL | Status: DC

## 2023-06-30 MED ORDER — TOCILIZUMAB 400 MG/20ML IV SOLN
400.0000 mg | INTRAVENOUS | Status: DC
  Administered 2023-06-30: 400 mg via INTRAVENOUS
  Filled 2023-06-30: qty 20

## 2023-07-01 NOTE — Progress Notes (Signed)
Creatinine is elevated and stable.  CBC is normal.

## 2023-07-12 ENCOUNTER — Other Ambulatory Visit: Payer: Self-pay | Admitting: Cardiovascular Disease

## 2023-07-25 ENCOUNTER — Other Ambulatory Visit: Payer: Self-pay | Admitting: Pharmacist

## 2023-07-25 DIAGNOSIS — Z79899 Other long term (current) drug therapy: Secondary | ICD-10-CM

## 2023-07-25 DIAGNOSIS — M0579 Rheumatoid arthritis with rheumatoid factor of multiple sites without organ or systems involvement: Secondary | ICD-10-CM

## 2023-07-25 NOTE — Progress Notes (Signed)
Next infusion scheduled for Actemra IV on 07/28/23 and due for updated orders. Diagnosis: RA, also on leflunomide 20mg  daily  Dose: 4mg /kg every 4 weeks  Last Clinic Visit: 05/14/23 Next Clinic Visit: 08/14/23  Last infusion: 07/28/23  Labs: CBC and CMP on 06/30/2023 - stable TB Gold: negative 02/07/23   Orders placed for Actemra IV x 3 doses along with premedication of acetaminophen and diphenhydramine to be administered 30 minutes before medication infusion.  Standing CBC with diff/platelet and CMP with GFR orders placed to be drawn every 2 months.  Next TB gold due 02/08/2024  Chesley Mires, PharmD, MPH, BCPS, CPP Clinical Pharmacist (Rheumatology and Pulmonology)

## 2023-07-28 ENCOUNTER — Ambulatory Visit (HOSPITAL_COMMUNITY)
Admission: RE | Admit: 2023-07-28 | Discharge: 2023-07-28 | Disposition: A | Discharge status: Home / Self Care | Payer: Medicare Other | Source: Ambulatory Visit | Attending: Rheumatology | Admitting: Rheumatology

## 2023-07-28 DIAGNOSIS — M0579 Rheumatoid arthritis with rheumatoid factor of multiple sites without organ or systems involvement: Secondary | ICD-10-CM | POA: Diagnosis not present

## 2023-07-28 DIAGNOSIS — Z79899 Other long term (current) drug therapy: Secondary | ICD-10-CM | POA: Insufficient documentation

## 2023-07-28 MED ORDER — ACETAMINOPHEN 325 MG PO TABS: 650.0000 mg | ORAL_TABLET | ORAL | Status: DC

## 2023-07-28 MED ORDER — DIPHENHYDRAMINE HCL 25 MG PO CAPS: 25.0000 mg | ORAL_CAPSULE | ORAL | Status: DC

## 2023-07-28 MED ORDER — TOCILIZUMAB 400 MG/20ML IV SOLN
400.0000 mg | INTRAVENOUS | Status: DC
  Administered 2023-07-28: 400 mg via INTRAVENOUS
  Filled 2023-07-28: qty 20

## 2023-07-30 DIAGNOSIS — H903 Sensorineural hearing loss, bilateral: Secondary | ICD-10-CM | POA: Diagnosis not present

## 2023-08-02 NOTE — Progress Notes (Signed)
Office Visit Note  Patient: [name]             Date of Birth: 07/03/1951           MRN: [id number]             PCP: Assunta Found, MD Referring: Assunta Found, MD Visit Date: 08/14/2023 Occupation: @GUAROCC @  Subjective:  Medication monitoring  History of Present Illness: [name] is a 72 y.o. male with history of seropositive rheumatoid arthritis, osteoarthritis, osteoporosis, and gout.  Patient remains on Actemra 4mg /kg IV infusions every 4 weeks and Arava 20 mg 1 tablet by mouth daily.  He is tolerating combination therapy without any side effects. He has not missed any doses recently.  He denies any recent or recurrent infections.  He denies any new medical conditions.  Patient states that he has noticed a nodule on his left elbow which initially was tender but is no longer causing discomfort.  Patient states that he fell about a week ago at home since he tripped over a paint can while he has been remodeling.  Patient denies any injury during the fall but has some residual bruising on his arms as well as his abdomen. He remains on allopurinol 100 mg 1 tablet by mouth daily.  He denies any signs or symptoms of a gout flare.    Activities of Daily Living:  Patient reports morning stiffness for 0  none .   Patient Denies nocturnal pain.  Difficulty dressing/grooming: Denies Difficulty climbing stairs: Denies Difficulty getting out of chair: Denies Difficulty using hands for taps, buttons, cutlery, and/or writing: Denies  Review of Systems  Constitutional:  Positive for fatigue.  HENT:  Positive for mouth dryness. Negative for mouth sores.   Eyes:  Negative for dryness.  Respiratory:  Positive for shortness of breath.   Cardiovascular:  Negative for chest pain and palpitations.  Gastrointestinal:  Positive for constipation. Negative for blood in stool and diarrhea.  Endocrine: Negative for increased urination.  Genitourinary:  Positive for involuntary urination.   Musculoskeletal:  Positive for joint pain, gait problem and joint pain. Negative for joint swelling, myalgias, muscle weakness, morning stiffness, muscle tenderness and myalgias.  Skin:  Negative for color change, rash, hair loss and sensitivity to sunlight.  Allergic/Immunologic: Negative for susceptible to infections.  Neurological:  Positive for dizziness. Negative for headaches.  Hematological:  Negative for swollen glands.  Psychiatric/Behavioral:  Negative for depressed mood and sleep disturbance. The patient is not nervous/anxious.     PMFS History:  Patient Active Problem List   Diagnosis Date Noted   Acute respiratory failure with hypoxia (HCC) 01/23/2023   Diaphragm paralysis 08/28/2022   Chest pain 03/11/2022   Stage 3a chronic kidney disease (CKD) (HCC) 03/11/2022   Bronchiectasis without complication (HCC) 03/26/2021   Incontinence when straining, male 03/20/2021   Erectile dysfunction after radical prostatectomy 09/08/2020   OAB (overactive bladder) 02/23/2020   Class 2 severe obesity due to excess calories with serious comorbidity and body mass index (BMI) of 35.0 to 35.9 in adult [facility])    Chronic diastolic HF (heart failure) (HCC)    Serratia sepsis (HCC) 06/08/2019   Voice hoarseness 04/26/2019   OSA (obstructive sleep apnea) 03/10/2019   Abnormal CT of the chest 02/09/2019   Dyspnea on exertion 10/22/2017   History of seizure disorder 02/27/2017   Primary osteoarthritis of both hands 02/27/2017   Primary osteoarthritis of both feet 02/27/2017   Idiopathic gout of multiple sites 02/27/2017  High risk medication use 12/29/2016   History of prostate cancer 12/29/2016   Primary osteoarthritis of both knees 12/29/2016   Chronic idiopathic gout involving toe without tophus 12/29/2016   History of CHF (congestive heart failure) 12/29/2016   History of COPD 12/29/2016   Plantar pustular psoriasis 12/29/2016   DVT (deep venous thrombosis) (HCC) 10/31/2016   COPD with  acute exacerbation (HCC) 10/31/2016   CHF (congestive heart failure) (HCC) 10/31/2016   Prostate cancer (HCC) 08/30/2015   Malignant neoplasm of prostate (HCC) 07/04/2015   Chest pain at rest 07/05/2014   Weakness 07/05/2014   Complicated postphlebitic syndrome 11/16/2013   Personal history of DVT (deep vein thrombosis) 05/18/2013   Chronic anticoagulation 05/18/2013   Diverticulosis of colon without hemorrhage 05/18/2013   Rheumatoid arthritis (HCC) 05/18/2013   Seizure disorder (HCC) 05/18/2013   Hx of adenomatous colonic polyps 05/18/2013   GERD 05/08/2010   NAUSEA 05/08/2010   FLATULENCE-GAS-BLOATING 05/08/2010    Past Medical History:  Diagnosis Date   Arthritis    RA   Asthma    CHF (congestive heart failure) (HCC)    pt denies    Clotting disorder (HCC)    DVT both legs    COPD (chronic obstructive pulmonary disease) (HCC)    DDD (degenerative disc disease), cervical    with UE's paresthesias   Diverticulosis    DVT, lower extremity (HCC)    bilat   Eye abnormality    right eye drifts has difficulty focusing 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 Unknown origin.   Shortness of breath dyspnea    exertion    Sleep apnea    cpap    Tubular adenoma of colon 04/2008    Family History  Problem Relation Age of Onset   Skin cancer Father    Stomach cancer Father    Heart attack Father    Alzheimer's disease Mother    Throat cancer Brother    Stomach cancer Brother    Lung cancer Brother    Heart attack Brother    Heart attack Brother    Breast cancer Sister    Heart attack Sister    Stroke Sister    Bladder Cancer  Sister    Heart murmur Sister    Colon cancer Brother    Colon polyps Neg Hx    Past Surgical History:  Procedure Laterality Date   CHOLECYSTECTOMY     CIRCUMCISION     COLONOSCOPY     CYSTOSCOPY WITH LITHOLAPAXY N/A 03/17/2019   Procedure: CYSTOSCOPY WITH LITHOLAPAXY AND REMOVAL OF FOREIGN BODY;  Surgeon: Malen Gauze, MD;  Location: [facility];  Service: Urology;  Laterality: N/A;   DOPPLER ECHOCARDIOGRAPHY N/A 01-21-2012   TECHNICALLY DIFFICULT. MILD CONCENTRIC LV HYPERTROPHY. LV CAVITY IS SMALL.. EF=> 55%. TRANSMITRAL SPECTRAL FLOW PATTREN IS SUGGESTIVE OF IMPAIRED LV RELAXATION. RV SYSTOLIC PRESSURE IS . LEFT ATRIAL SIZE IS NORMAL. AV APPEARS MILDLY SCLEROTIC. NO SIGN VALVE DISEASE NOTED.   LEFT HEART CATH AND CORONARY ANGIOGRAPHY N/A 03/12/2022  Procedure: LEFT HEART CATH AND CORONARY ANGIOGRAPHY;  Surgeon: Tonny Bollman, MD;  Location: [facility] INVASIVE CV LAB;  Service: Cardiovascular;  Laterality: N/A;   LYMPHADENECTOMY Bilateral 08/30/2015   Procedure: PELVIC LYMPHADENECTOMY;  Surgeon: Malen Gauze, MD;  Location: [facility];  Service: Urology;  Laterality: Bilateral;   NUCLEAR STRESS TEST N/A 01-21-2012   NORMAL PATTERN OF PERFUSION IN ALL REGIONS. POST STRESS LV SIZE IS NORMAL. NO EVIDENCE OF INDUCIBLE ISCHEMIA. EF 54%.   POLYPECTOMY     PROSTATE BIOPSY     ROBOT ASSISTED LAPAROSCOPIC RADICAL PROSTATECTOMY N/A 08/30/2015   Procedure: ROBOTIC ASSISTED LAPAROSCOPIC RADICAL PROSTATECTOMY;  Surgeon: Malen Gauze, MD;  Location: [facility];  Service: Urology;  Laterality: N/A;   URINARY SPHINCTER IMPLANT N/A 03/20/2021   Procedure: ARTIFICIAL URINARY SPHINCTER CYSTOSCOPY;  Surgeon: Alfredo Martinez, MD;  Location: [facility];  Service: Urology;  Laterality: N/A;  REQUESTING 90 MINS   US VENOUS LOWER EXT Right 03/07/11   PERSISTENT DVT IN RIGHT LOWER EXT.WITH PERSISTANT VISUALIZATION OF HYPOECHOIC THROMBUS WITHIN THE FEMORAL, PROFUNDA FEMORAL AND POPLITEAL VEINS. WHEN COMPARED TO  PREVIOUS, CLOT IS NO LONGER IDENTIFIED WITH IN THE RIGHT CFV.   Social History   Social History Narrative   Not on file   Immunization History  Administered Date(s) Administered   Fluad Quad(high Dose 65+) 09/30/2019   Influenza, High Dose Seasonal PF 10/05/2018, 10/10/2021   Influenza,inj,Quad PF,6+ Mos 08/31/2015   Influenza-Unspecified 09/30/2016, 08/22/2017   Moderna Sars-Covid-2 Vaccination 01/21/2020, 02/18/2020, 10/25/2020, 10/10/2021   Pneumococcal Conjugate-13 10/05/2018   Pneumococcal-Unspecified 12/30/2012   Td 07/05/1996     Objective: Vital Signs: BP 125/72 (BP Location: Left Arm, Patient Position: Sitting, Cuff Size: Normal)   Pulse 64   Resp 18   Ht 5\' 9"  (1.753 m)   Wt 226 lb (102.5 kg)   BMI 33.37 kg/m    Physical Exam Vitals and nursing note reviewed.  Constitutional:      Appearance: He is well-developed.  HENT:     Head: Normocephalic and atraumatic.  Eyes:     Conjunctiva/sclera: Conjunctivae normal.     Pupils: Pupils are equal, round, and reactive to light.  Cardiovascular:     Rate and Rhythm: Normal rate and regular rhythm.     Heart sounds: Normal heart sounds.  Pulmonary:     Effort: Pulmonary effort is normal.     Breath sounds: Normal breath sounds.  Abdominal:     General: Bowel sounds are normal.     Palpations: Abdomen is soft.  Musculoskeletal:     Cervical back: Normal range of motion and neck supple.  Skin:    General: Skin is warm and dry.     Capillary Refill: Capillary refill takes less than 2 seconds.     Comments: Small mobile, non-tender nodule noted on extensor surface of left elbow.   Neurological:     Mental Status: He is alert and oriented to person, place, and time.  Psychiatric:        Behavior: Behavior normal.      Musculoskeletal Exam: C-spine has limited range of motion with lateral rotation.  Thoracic kyphosis noted.  Shoulder joints have good range of motion with some discomfort in the left shoulder.   Elbow joints have good range of motion with no tenderness or inflammation.  Small palpable nodule noted on the extensor surface of the left elbow nontender to touch.  Wrist joints have good range of motion.  Synovial thickening over the right first MCP.  PIP and DIP thickening consistent with osteoarthritis of both hands.  Complete fist formation noted bilaterally.  Knee joints have good range of motion with no warmth or effusion.  Severe pedal edema noted in bilateral lower legs.  CDAI Exam: CDAI Score: -- Patient Global: 10 / 100; Provider Global: 10 / 100 Swollen: --; Tender: -- Joint Exam 08/14/2023   No joint exam has been documented for this visit   There is currently no information documented on the homunculus. Go to the Rheumatology activity and complete the homunculus joint exam.  Investigation: No additional findings.  Imaging: No results found.  Recent Labs: Lab Results  Component Value Date   WBC 6.3 06/30/2023   HGB 14.2 06/30/2023   PLT 169 06/30/2023   NA 138 06/30/2023   K 3.7 06/30/2023   CL 100 06/30/2023   CO2 29 06/30/2023   GLUCOSE 127 (H) 06/30/2023   BUN 18 06/30/2023   CREATININE 1.51 (H) 06/30/2023   BILITOT 0.7 06/30/2023   ALKPHOS 55 06/30/2023   AST 30 06/30/2023   ALT 28 06/30/2023   PROT 6.6 06/30/2023   ALBUMIN 4.0 06/30/2023   CALCIUM 9.0 06/30/2023   GFRAA >60 09/20/2020   QFTBGOLDPLUS Negative 02/07/2023    Speciality Comments: ACTEMRA 4mg /kg x 4 weeks PPD negative 01/02/17  Procedures:  No procedures performed Allergies: Orencia [abatacept], Carbamazepine, Celecoxib, Cephalexin, Enbrel [etanercept], Humira [adalimumab], Levofloxacin, Sulfa antibiotics, and Sulfasalazine    Assessment / Plan:     Visit Diagnoses: Rheumatoid arthritis involving multiple sites with positive rheumatoid factor (HCC): He has no synovitis on examination today.  He has not had any signs or symptoms of a rheumatoid arthritis flare.  He has clinically been  doing well on Actemra 4 mg/kg IV infusions every 4 weeks and Arava 20 mg 1 tablet by mouth daily.  He has been tolerating combination therapy without any side effects and has not missed any doses recently.  No recent infections.  Patient will remain on combination therapy as prescribed.  He was advised to notify us if he develops signs or symptoms of a flare. He will follow-up in the office in 3 to 4 months or sooner if needed.  High risk medication use - Actemra 4mg /kg IV infusions every 4 weeks and Arava 20 mg 1 tablet by mouth daily. CBC and CMP updated on 06/30/23.  He will continue to have routine lab work with his infusions. TB gold negative on 02/07/23.   No recent infections.  Discussed the importance of holding actemra and arava if he develops signs or symptoms of an infection and to resume once the infection has completely cleared.   Age-related osteoporosis without current pathological fracture: DEXA updated on 02/19/2023: Left femoral neck BMD 0.632 with T-score -2.2.  Statistically significant increase in the left hip.  Lumbar spine and right hip are stable. Patient was previously taking Fosamax from 2018 until March 2024.  He has discontinued the use of Fosamax for a drug holiday.  He will continue to take vitamin D 1000 units daily.  Recheck DEXA in February 2026. Previous DEXA on 09/28/2019 T-score -2.1 right femur neck with +12 % change in BMD.   Idiopathic chronic gout of multiple sites without tophus - He has not had any recent gout flares.  He is been taking allopurinol 100 mg 1 tablet by mouth daily.   Primary osteoarthritis of both hands: PIP and DIP thickening consistent with osteoarthritis of both hands.  No tenderness or synovitis noted today.  Complete  fist formation noted bilaterally.  Primary osteoarthritis of both knees: Patient has good range of motion of both knee joints on examination today.  No warmth or effusion noted.  Primary osteoarthritis of both feet: He is not  experiencing any increased joint pain in his feet currently.  He wears proper fitting shoes.  Significant pedal edema noted in bilateral lower legs.  He has been wearing compression wraps.  Other medical conditions are listed as follows:  History of abscessed tooth  History of COPD  History of adenomatous polyp of colon  History of seizure disorder  History of CHF (congestive heart failure)  History of prostate cancer  History of DVT (deep vein thrombosis)  Orders: No orders of the defined types were placed in this encounter.  No orders of the defined types were placed in this encounter.    Follow-Up Instructions: Return in about 4 months (around 12/14/2023) for Rheumatoid arthritis.   Gearldine Bienenstock, PA-C  Note - This record has been created using Dragon software.  Chart creation errors have been sought, but may not always  have been located. Such creation errors do not reflect on  the standard of medical care.

## 2023-08-11 DIAGNOSIS — H903 Sensorineural hearing loss, bilateral: Secondary | ICD-10-CM | POA: Diagnosis not present

## 2023-08-11 DIAGNOSIS — H6123 Impacted cerumen, bilateral: Secondary | ICD-10-CM | POA: Diagnosis not present

## 2023-08-11 DIAGNOSIS — H61309 Acquired stenosis of external ear canal, unspecified, unspecified ear: Secondary | ICD-10-CM | POA: Diagnosis not present

## 2023-08-14 ENCOUNTER — Ambulatory Visit: Payer: Medicare Other | Attending: Physician Assistant | Admitting: Physician Assistant

## 2023-08-14 ENCOUNTER — Encounter: Payer: Self-pay | Admitting: Physician Assistant

## 2023-08-14 VITALS — BP 125/72 | HR 64 | Resp 18 | Ht 69.0 in | Wt 226.0 lb

## 2023-08-14 DIAGNOSIS — Z8679 Personal history of other diseases of the circulatory system: Secondary | ICD-10-CM

## 2023-08-14 DIAGNOSIS — M1A09X0 Idiopathic chronic gout, multiple sites, without tophus (tophi): Secondary | ICD-10-CM | POA: Diagnosis not present

## 2023-08-14 DIAGNOSIS — M19042 Primary osteoarthritis, left hand: Secondary | ICD-10-CM | POA: Insufficient documentation

## 2023-08-14 DIAGNOSIS — Z8546 Personal history of malignant neoplasm of prostate: Secondary | ICD-10-CM

## 2023-08-14 DIAGNOSIS — Z8669 Personal history of other diseases of the nervous system and sense organs: Secondary | ICD-10-CM

## 2023-08-14 DIAGNOSIS — M170 Bilateral primary osteoarthritis of knee: Secondary | ICD-10-CM | POA: Diagnosis not present

## 2023-08-14 DIAGNOSIS — Z86010 Personal history of colonic polyps: Secondary | ICD-10-CM | POA: Diagnosis not present

## 2023-08-14 DIAGNOSIS — M0579 Rheumatoid arthritis with rheumatoid factor of multiple sites without organ or systems involvement: Secondary | ICD-10-CM | POA: Diagnosis not present

## 2023-08-14 DIAGNOSIS — Z860101 Personal history of adenomatous and serrated colon polyps: Secondary | ICD-10-CM

## 2023-08-14 DIAGNOSIS — M19041 Primary osteoarthritis, right hand: Secondary | ICD-10-CM

## 2023-08-14 DIAGNOSIS — M810 Age-related osteoporosis without current pathological fracture: Secondary | ICD-10-CM

## 2023-08-14 DIAGNOSIS — M19071 Primary osteoarthritis, right ankle and foot: Secondary | ICD-10-CM

## 2023-08-14 DIAGNOSIS — Z8719 Personal history of other diseases of the digestive system: Secondary | ICD-10-CM

## 2023-08-14 DIAGNOSIS — Z86718 Personal history of other venous thrombosis and embolism: Secondary | ICD-10-CM | POA: Diagnosis not present

## 2023-08-14 DIAGNOSIS — Z8709 Personal history of other diseases of the respiratory system: Secondary | ICD-10-CM

## 2023-08-14 DIAGNOSIS — M19072 Primary osteoarthritis, left ankle and foot: Secondary | ICD-10-CM | POA: Diagnosis not present

## 2023-08-14 DIAGNOSIS — Z79899 Other long term (current) drug therapy: Secondary | ICD-10-CM

## 2023-08-25 ENCOUNTER — Ambulatory Visit (HOSPITAL_COMMUNITY)
Admission: RE | Admit: 2023-08-25 | Discharge: 2023-08-25 | Disposition: A | Discharge status: Home / Self Care | Payer: Medicare Other | Source: Ambulatory Visit | Attending: Rheumatology | Admitting: Rheumatology

## 2023-08-25 DIAGNOSIS — M0579 Rheumatoid arthritis with rheumatoid factor of multiple sites without organ or systems involvement: Secondary | ICD-10-CM | POA: Diagnosis not present

## 2023-08-25 DIAGNOSIS — Z79899 Other long term (current) drug therapy: Secondary | ICD-10-CM | POA: Diagnosis not present

## 2023-08-25 MED ORDER — ACETAMINOPHEN 325 MG PO TABS: 650.0000 mg | ORAL_TABLET | ORAL | Status: DC

## 2023-08-25 MED ORDER — DIPHENHYDRAMINE HCL 25 MG PO CAPS: 25.0000 mg | ORAL_CAPSULE | ORAL | Status: DC

## 2023-08-25 MED ORDER — TOCILIZUMAB 400 MG/20ML IV SOLN
4.0000 mg/kg | INTRAVENOUS | Status: DC
  Administered 2023-08-25: 408 mg via INTRAVENOUS
  Filled 2023-08-25: qty 20

## 2023-08-27 ENCOUNTER — Other Ambulatory Visit: Payer: Self-pay | Admitting: Rheumatology

## 2023-08-27 ENCOUNTER — Encounter: Payer: Self-pay | Admitting: Gastroenterology

## 2023-08-27 ENCOUNTER — Ambulatory Visit (INDEPENDENT_AMBULATORY_CARE_PROVIDER_SITE_OTHER): Payer: Medicare Other | Admitting: Gastroenterology

## 2023-08-27 ENCOUNTER — Telehealth: Payer: Self-pay | Admitting: *Deleted

## 2023-08-27 VITALS — BP 120/80 | HR 67 | Ht 69.0 in | Wt 224.0 lb

## 2023-08-27 DIAGNOSIS — K625 Hemorrhage of anus and rectum: Secondary | ICD-10-CM

## 2023-08-27 DIAGNOSIS — Z860100 Personal history of colon polyps, unspecified: Secondary | ICD-10-CM

## 2023-08-27 DIAGNOSIS — Z7901 Long term (current) use of anticoagulants: Secondary | ICD-10-CM

## 2023-08-27 MED ORDER — NA SULFATE-K SULFATE-MG SULF 17.5-3.13-1.6 GM/177ML PO SOLN: 1.0000 | Freq: Once | ORAL | 0 refills | Status: AC

## 2023-08-27 NOTE — Progress Notes (Signed)
08/27/2023 [name] [id number] 09-May-1951   HISTORY OF PRESENT ILLNESS: This is a 72 year old male who is a patient Dr. Ardell Isaacs.  He is here today to discuss and schedule colonoscopy.  He is on Coumadin for history of DVTs.  He has stopped/held this in the past for previous colonoscopies, etc. without any issues.  Last colonoscopy 2017 as follows:  Colonoscopy 06/2016: - One 7 mm polyp in the sigmoid colon, removed with a cold snare. Resected and retrieved. - One 4 mm polyp in the transverse colon, removed with a cold biopsy forceps. Resected and retrieved. - Mild diverticulosis in the sigmoid colon and in the descending colon.  - Small, grade I internal hemorrhoids  He does mention that he has intermittent rectal bleeding.  This is not a new issue as he was seen by Dr. Russella Dar for this back in 2019.  It is bright red blood on the toilet paper and in the toilet bowl at times.  He says that he moves his bowels about every 3 to 4 days.  Past Medical History:  Diagnosis Date   Arthritis    RA   Asthma    CHF (congestive heart failure) (HCC)    pt denies    Clotting disorder (HCC)    DVT both legs    COPD (chronic obstructive pulmonary disease) (HCC)    DDD (degenerative disc disease), cervical    with UE's paresthesias   Diverticulosis    DVT, lower extremity (HCC)    bilat   Eye abnormality    right eye drifts has difficulty focusing 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 Unknown origin.   Shortness of breath dyspnea    exertion    Sleep apnea    cpap    Tubular adenoma of colon 04/2008   Past Surgical History:   Procedure Laterality Date   CHOLECYSTECTOMY     CIRCUMCISION     COLONOSCOPY     CYSTOSCOPY WITH LITHOLAPAXY N/A 03/17/2019   Procedure: CYSTOSCOPY WITH LITHOLAPAXY AND REMOVAL OF FOREIGN BODY;  Surgeon: Malen Gauze, MD;  Location: [facility];  Service: Urology;  Laterality: N/A;   DOPPLER ECHOCARDIOGRAPHY N/A 01-21-2012   TECHNICALLY DIFFICULT. MILD CONCENTRIC LV HYPERTROPHY. LV CAVITY IS SMALL.. EF=> 55%. TRANSMITRAL SPECTRAL FLOW PATTREN IS SUGGESTIVE OF IMPAIRED LV RELAXATION. RV SYSTOLIC PRESSURE IS . LEFT ATRIAL SIZE IS NORMAL. AV APPEARS MILDLY SCLEROTIC. NO SIGN VALVE DISEASE NOTED.   LEFT HEART CATH AND CORONARY ANGIOGRAPHY N/A 03/12/2022   Procedure: LEFT HEART CATH AND CORONARY ANGIOGRAPHY;  Surgeon: Tonny Bollman, MD;  Location: [facility] INVASIVE CV LAB;  Service: Cardiovascular;  Laterality: N/A;   LYMPHADENECTOMY Bilateral 08/30/2015   Procedure: PELVIC LYMPHADENECTOMY;  Surgeon: Malen Gauze, MD;  Location: [facility];  Service: Urology;  Laterality: Bilateral;   NUCLEAR STRESS TEST N/A 01-21-2012   NORMAL PATTERN OF PERFUSION IN ALL REGIONS. POST STRESS LV SIZE IS NORMAL. NO EVIDENCE OF INDUCIBLE ISCHEMIA. EF 54%.   POLYPECTOMY     PROSTATE  BIOPSY     ROBOT ASSISTED LAPAROSCOPIC RADICAL PROSTATECTOMY N/A 08/30/2015   Procedure: ROBOTIC ASSISTED LAPAROSCOPIC RADICAL PROSTATECTOMY;  Surgeon: Malen Gauze, MD;  Location: [facility];  Service: Urology;  Laterality: N/A;   URINARY SPHINCTER IMPLANT N/A 03/20/2021   Procedure: ARTIFICIAL URINARY SPHINCTER CYSTOSCOPY;  Surgeon: Alfredo Martinez, MD;  Location: [facility];  Service: Urology;  Laterality: N/A;  REQUESTING 90 MINS   US VENOUS LOWER EXT Right 03/07/11   PERSISTENT DVT IN RIGHT LOWER EXT.WITH PERSISTANT VISUALIZATION OF HYPOECHOIC THROMBUS WITHIN THE FEMORAL, PROFUNDA FEMORAL AND POPLITEAL VEINS. WHEN COMPARED TO PREVIOUS, CLOT IS NO LONGER IDENTIFIED WITH IN THE RIGHT CFV.    reports that he quit smoking about 26 years  ago. His smoking use included cigarettes. He started smoking about 46 years ago. He has a 60 pack-year smoking history. He has never been exposed to tobacco smoke. He has never used smokeless tobacco. He reports that he does not drink alcohol and does not use drugs. family history includes Alzheimer's disease in his mother; Bladder Cancer in his sister; Breast cancer in his sister; Colon cancer in his brother; Heart attack in his brother, brother, father, and sister; Heart murmur in his sister; Lung cancer in his brother; Skin cancer in his father; Stomach cancer in his brother and father; Stroke in his sister; Throat cancer in his brother. Allergies  Allergen Reactions   Orencia [Abatacept] Anaphylaxis    Had prostate cancer   Carbamazepine Rash    REACTION: makes drowsy BRAND NAME IS TEGRETOL   Celecoxib Itching and Rash    BRAND NAME IS CELEBREX   Cephalexin Rash    "Severe Rash".  BRAND NAME IS KEFLEX.    Enbrel [Etanercept] Rash   Humira [Adalimumab] Rash   Levofloxacin Other (See Comments)    REACTION: GI Intolerance BRAND NAME IS LEVAQUIN   Sulfa Antibiotics Rash   Sulfasalazine Rash      Outpatient Encounter Medications as of 08/27/2023  Medication Sig   acetaminophen (TYLENOL) 650 MG CR tablet Take 1,300 mg by mouth every 8 (eight) hours as needed for pain.   albuterol (PROVENTIL) (2.5 MG/3ML) 0.083% nebulizer solution Take 3 mLs (2.5 mg total) by nebulization every 2 (two) hours as needed for wheezing or shortness of breath.   allopurinol (ZYLOPRIM) 100 MG tablet Take 1 tablet (100 mg total) by mouth 2 (two) times daily. (Patient taking differently: Take 100 mg by mouth 2 (two) times daily. One daily)   ascorbic acid (VITAMIN C) 500 MG tablet Take 500 mg by mouth daily.   cholecalciferol (VITAMIN D3) 25 MCG (1000 UNIT) tablet Take 1,000 Units by mouth daily.   diclofenac Sodium (VOLTAREN) 1 % GEL Apply 2-4 grams to affected joint 4 times daily as needed. (Patient taking  differently: Apply 2-4 grams to affected joint 4 times daily as needed for pain)   dicyclomine (BENTYL) 10 MG capsule TAKE 1 CAPSULE BY MOUTH 4 TIMES DAILY BEFORE MEALS AND AT BEDTIME (Patient taking differently: Take 20 mg by mouth 2 (two) times daily.)   Fluticasone Furoate-Vilanterol (BREO ELLIPTA) 50-25 MCG/ACT AEPB Inhale 1 Inhalation into the lungs 2 (two) times daily.   furosemide (LASIX) 80 MG tablet TAKE 1 TABLET BY MOUTH EVERY DAY   leflunomide (ARAVA) 20 MG tablet TAKE 1 TABLET BY MOUTH EVERY DAY   mupirocin ointment (BACTROBAN) 2 % SMARTSIG:Sparingly Topical 3 Times Daily   omeprazole (PRILOSEC) 40 MG capsule Take 1 capsule (40 mg total) by mouth daily.   oxybutynin (  DITROPAN-XL) 10 MG 24 hr tablet Take 1 tablet (10 mg total) by mouth daily.   phenytoin (DILANTIN) 100 MG ER capsule Take 100-200 mg by mouth See admin instructions. Take one capsule in the morning and 2 capsules at bedtime   Potassium Chloride ER 20 MEQ TBCR Take 20 mEq by mouth daily.   rosuvastatin (CRESTOR) 10 MG tablet Take 10 mg by mouth daily.   spironolactone (ALDACTONE) 50 MG tablet TAKE 1 TABLET BY MOUTH EVERY DAY   Tocilizumab (ACTEMRA IV) Inject into the vein every 28 (twenty-eight) days.   Vibegron (GEMTESA) 75 MG TABS Take 1 tablet (75 mg total) by mouth daily. (Patient taking differently: Take 1 tablet by mouth as needed.)   warfarin (COUMADIN) 3 MG tablet Take 1 tablet (3 mg total) by mouth at bedtime. Restart on 3/24 (48 hours after surgery)   No facility-administered encounter medications on file as of 08/27/2023.     REVIEW OF SYSTEMS  : All other systems reviewed and negative except where noted in the History of Present Illness.   PHYSICAL EXAM: BP 120/80   Pulse 67   Ht 5\' 9"  (1.753 m)   Wt 224 lb (101.6 kg)   BMI 33.08 kg/m  General: Well developed white male in no acute distress Head: Normocephalic and atraumatic Eyes:  Sclerae anicteric, conjunctiva pink. Ears: Normal auditory  acuity Lungs: Clear throughout to auscultation; no W/R/R. Heart: Regular rate and rhythm; no M/R/G. Abdomen: Soft, nontender, non distended. No masses or hepatomegaly noted. Normal bowel sounds Rectal:  Will be done at the time of colonoscopy. Musculoskeletal: Symmetrical with no gross deformities  Skin: No lesions on visible extremities Extremities: No edema  Neurological: Alert oriented x 4, grossly non-focal Psychological:  Alert and cooperative. Normal mood and affect  ASSESSMENT AND PLAN: *Personal history of colon polyps: Last colonoscopy 2017.  Will schedule with Dr. Russella Dar. *Chronic anticoagulation with Coumadin due to history of DVT:  Will hold coumadin for 5 days prior to endoscopic procedures - will instruct when and how to resume after procedure. Benefits and risks of procedure explained including risks of bleeding, perforation, infection, missed lesions, reactions to medications and possible need for hospitalization and surgery for complications. Additional rare but real risk of stroke or other vascular clotting events off of coumadin also explained and need to seek urgent help if any signs of these problems occur. Will communicate by phone or EMR with patient's prescribing provider to confirm that holding coumadin is reasonable in this case.   *Rectal bleeding: Likely hemorrhoidal.  Will prescribe hydrocortisone cream that he can use on a Preparation H suppository at bedtime as needed.  Sent to pharmacy.  Hemorrhoids and bleeding obviously worse in the setting of anticoagulation and symptoms worse with mild constipation or any straining.  He will begin trying some daily fiber supplement such as Benefiber or Metamucil or MiraLAX to try to help him move his bowels a little bit right more regularly.  Does have a history of prostate cancer status post prostatectomy and radiation therapy so he could have some radiation proctitis as well.  CC:  Assunta Found, MD

## 2023-08-27 NOTE — Patient Instructions (Addendum)
We have sent the following medications to your pharmacy for you to pick up at your convenience: Hydrocortisone cream 2.5% - apply small amount on Prep H suppository nightly for 7 days.   Start daily fiber supplement (Benefiber) or can try daily Miralax.   You will be contaced by our office prior to your procedure for directions on holding your Coumadin/Warfarin.  If you do not hear from our office 1 week prior to your scheduled procedure, please call [phone] to discuss.  You have been scheduled for a colonoscopy. Please follow written instructions given to you at your visit today.   Please pick up your prep supplies at the pharmacy within the next 1-3 days.  If you use inhalers (even only as needed), please bring them with you on the day of your procedure.  DO NOT TAKE 7 DAYS PRIOR TO TEST- Trulicity (dulaglutide) Ozempic, Wegovy (semaglutide) Mounjaro (tirzepatide) Bydureon Bcise (exanatide extended release)  DO NOT TAKE 1 DAY PRIOR TO YOUR TEST Rybelsus (semaglutide) Adlyxin (lixisenatide) Victoza (liraglutide) Byetta (exanatide) _______________________________________________________  If your blood pressure at your visit was 140/90 or greater, please contact your primary care physician to follow up on this.  _______________________________________________________  If you are age 13 or older, your body mass index should be between 23-30. Your Body mass index is 33.08 kg/m. If this is out of the aforementioned range listed, please consider follow up with your Primary Care Provider.  If you are age 76 or younger, your body mass index should be between 19-25. Your Body mass index is 33.08 kg/m. If this is out of the aformentioned range listed, please consider follow up with your Primary Care Provider.   ________________________________________________________  The [facility] providers would like to encourage you to use [facility] to communicate with providers for non-urgent  requests or questions.  Due to long hold times on the telephone, sending your provider a message by [facility] may be a faster and more efficient way to get a response.  Please allow 48 business hours for a response.  Please remember that this is for non-urgent requests.  _______________________________________________________

## 2023-08-28 ENCOUNTER — Other Ambulatory Visit: Payer: Self-pay | Admitting: Physician Assistant

## 2023-08-28 DIAGNOSIS — Z6834 Body mass index (BMI) 34.0-34.9, adult: Secondary | ICD-10-CM | POA: Diagnosis not present

## 2023-08-28 DIAGNOSIS — Z7901 Long term (current) use of anticoagulants: Secondary | ICD-10-CM | POA: Diagnosis not present

## 2023-08-28 DIAGNOSIS — I10 Essential (primary) hypertension: Secondary | ICD-10-CM | POA: Diagnosis not present

## 2023-08-28 DIAGNOSIS — E6609 Other obesity due to excess calories: Secondary | ICD-10-CM | POA: Diagnosis not present

## 2023-08-28 NOTE — Telephone Encounter (Signed)
Last Fill: 06/02/2023  Labs: 06/30/2023 Creatinine is elevated and stable.  CBC is normal.   Next Visit: 01/01/2024  Last Visit: 08/14/2023  DX: Rheumatoid arthritis involving multiple sites with positive rheumatoid factor   Current Dose per office note 08/14/2023: Arava 20 mg 1 tablet by mouth daily   Okay to refill Arava ?

## 2023-09-02 NOTE — Telephone Encounter (Signed)
Faxed clearance request to Dr. Phillips Odor office.

## 2023-09-05 NOTE — Telephone Encounter (Signed)
Spoke with Dia Sitter at Dr. Phillips Odor office. Dr. Phillips Odor is out of the office this will. Refaxed request to Lucendia Herrlich (Dr. Phillips Odor nurse) to make sure he responds on Monday.

## 2023-09-08 NOTE — Telephone Encounter (Signed)
Still waiting on response. Dr. Phillips Odor did not address today.

## 2023-09-09 NOTE — Telephone Encounter (Signed)
Per Dr. Phillips Odor patient may hold Coumadin 5 days prior to procedure. Left message  for patient to call office.

## 2023-09-10 NOTE — Telephone Encounter (Signed)
Patient informed. 

## 2023-09-15 ENCOUNTER — Encounter: Payer: Self-pay | Admitting: Gastroenterology

## 2023-09-15 ENCOUNTER — Ambulatory Visit (AMBULATORY_SURGERY_CENTER): Payer: Medicare Other | Admitting: Gastroenterology

## 2023-09-15 VITALS — BP 144/77 | HR 74 | Temp 99.5°F | Resp 11 | Ht 69.0 in | Wt 224.0 lb

## 2023-09-15 DIAGNOSIS — K921 Melena: Secondary | ICD-10-CM | POA: Diagnosis not present

## 2023-09-15 DIAGNOSIS — Z09 Encounter for follow-up examination after completed treatment for conditions other than malignant neoplasm: Secondary | ICD-10-CM

## 2023-09-15 DIAGNOSIS — Z86010 Personal history of colonic polyps: Secondary | ICD-10-CM

## 2023-09-15 DIAGNOSIS — D122 Benign neoplasm of ascending colon: Secondary | ICD-10-CM | POA: Diagnosis not present

## 2023-09-15 MED ORDER — SODIUM CHLORIDE 0.9 % IV SOLN: 500.0000 mL | INTRAVENOUS | Status: DC

## 2023-09-15 NOTE — Patient Instructions (Addendum)
Handouts provided on polyps, diverticulosis and hemorrhoids.  Resume previous diet.  Continue present medications.  Resume Coumadin (warfarin) tomorrow at prior dose. Refer to managing physician for further adjustment of therapy.  Await pathology results.   YOU HAD AN ENDOSCOPIC PROCEDURE TODAY AT THE [facility]:   Refer to the procedure report that was given to you for any specific questions about what was found during the examination.  If the procedure report does not answer your questions, please call your gastroenterologist to clarify.  If you requested that your care partner not be given the details of your procedure findings, then the procedure report has been included in a sealed envelope for you to review at your convenience later.  YOU SHOULD EXPECT: Some feelings of bloating in the abdomen. Passage of more gas than usual.  Walking can help get rid of the air that was put into your GI tract during the procedure and reduce the bloating. If you had a lower endoscopy (such as a colonoscopy or flexible sigmoidoscopy) you may notice spotting of blood in your stool or on the toilet paper. If you underwent a bowel prep for your procedure, you may not have a normal bowel movement for a few days.  Please Note:  You might notice some irritation and congestion in your nose or some drainage.  This is from the oxygen used during your procedure.  There is no need for concern and it should clear up in a day or so.  SYMPTOMS TO REPORT IMMEDIATELY:  Following lower endoscopy (colonoscopy or flexible sigmoidoscopy):  Excessive amounts of blood in the stool  Significant tenderness or worsening of abdominal pains  Swelling of the abdomen that is new, acute  Fever of 100F or higher  For urgent or emergent issues, a gastroenterologist can be reached at any hour by calling [phone]. Do not use MyChart messaging for urgent concerns.    DIET:  We do recommend a small meal at first, but  then you may proceed to your regular diet.  Drink plenty of fluids but you should avoid alcoholic beverages for 24 hours.  ACTIVITY:  You should plan to take it easy for the rest of today and you should NOT DRIVE or use heavy machinery until tomorrow (because of the sedation medicines used during the test).    FOLLOW UP: Our staff will call the number listed on your records the next business day following your procedure.  We will call around 7:15- 8:00 am to check on you and address any questions or concerns that you may have regarding the information given to you following your procedure. If we do not reach you, we will leave a message.     If any biopsies were taken you will be contacted by phone or by letter within the next 1-3 weeks.  Please call us at [phone] if you have not heard about the biopsies in 3 weeks.    SIGNATURES/CONFIDENTIALITY: You and/or your care partner have signed paperwork which will be entered into your electronic medical record.  These signatures attest to the fact that that the information above on your After Visit Summary has been reviewed and is understood.  Full responsibility of the confidentiality of this discharge information lies with you and/or your care-partner.

## 2023-09-15 NOTE — Progress Notes (Signed)
To pacu, VSS. Report to [email or web address] 

## 2023-09-15 NOTE — Op Note (Signed)
[facility] Patient Name: [name] Procedure Date: 09/15/2023 9:43 AM MRN: [id number] Endoscopist: Meryl Dare , MD, [phone] Age: 72 Referring MD:  Date of Birth: Oct 16, 1951 Gender: Male Account #: [id number] Procedure:                Colonoscopy Indications:              Hematochezia, Personal history of adenomatous colon                            polyps Medicines:                Monitored Anesthesia Care Procedure:                Pre-Anesthesia Assessment:                           - Prior to the procedure, a History and Physical                            was performed, and patient medications and                            allergies were reviewed. The patient's tolerance of                            previous anesthesia was also reviewed. The risks                            and benefits of the procedure and the sedation                            options and risks were discussed with the patient.                            All questions were answered, and informed consent                            was obtained. Prior Anticoagulants: The patient has                            taken Coumadin (warfarin), last dose was 5 days                            prior to procedure. ASA Grade Assessment: III - A                            patient with severe systemic disease. After                            reviewing the risks and benefits, the patient was                            deemed in satisfactory condition to undergo the  procedure.                           After obtaining informed consent, the colonoscope                            was passed under direct vision. Throughout the                            procedure, the patient's blood pressure, pulse, and                            oxygen saturations were monitored continuously. The                            Olympus Scope SN: [id number] was introduced through                             the anus and advanced to the the cecum, identified                            by appendiceal orifice and ileocecal valve. The                            ileocecal valve, appendiceal orifice, and rectum                            were photographed. The quality of the bowel                            preparation was adequate. The colonoscopy was                            performed without difficulty. The patient tolerated                            the procedure well. Scope In: 9:56:14 AM Scope Out: 10:10:22 AM Scope Withdrawal Time: 0 hours 11 minutes 4 seconds  Total Procedure Duration: 0 hours 14 minutes 8 seconds  Findings:                 The perianal and digital rectal examinations were                            normal.                           A 7 mm polyp was found in the ascending colon. The                            polyp was sessile. The polyp was removed with a                            cold snare. Resection and retrieval were complete.  Multiple medium-mouthed and small-mouthed                            diverticula were found in the left colon. There was                            no evidence of diverticular bleeding.                           Multiple medium-sized localized angioectasias                            without bleeding were found in the distal rectum                            c/w radiation proctopathy.                           Internal hemorrhoids were found during                            retroflexion. The hemorrhoids were small and Grade                            I (internal hemorrhoids that do not prolapse).                           The exam was otherwise without abnormality on                            direct and retroflexion views. Complications:            No immediate complications. Estimated blood loss:                            None. Estimated Blood Loss:     Estimated blood loss: none. Impression:                - One 7 mm polyp in the ascending colon, removed                            with a cold snare. Resected and retrieved.                           - Mild diverticulosis in the left colon.                           - Multiple non-bleeding rectal angioectasias c/w                            radiation proctopathy.                           - Internal hemorrhoids.                           - The examination was  otherwise normal on direct                            and retroflexion views. Recommendation:           - Repeat colonoscopy after studies are complete for                            surveillance based on pathology results.                           - Resume Coumadin (warfarin) tomorrow at prior                            dose. Refer to managing physician for further                            adjustment of therapy.                           - Patient has a contact number available for                            emergencies. The signs and symptoms of potential                            delayed complications were discussed with the                            patient. Return to normal activities tomorrow.                            Written discharge instructions were provided to the                            patient.                           - Resume previous diet.                           - Continue present medications.                           - Await pathology results. Meryl Dare, MD 09/15/2023 10:18:51 AM This report has been signed electronically.

## 2023-09-15 NOTE — Progress Notes (Signed)
Called to room to assist during endoscopic procedure.  Patient ID and intended procedure confirmed with present staff. Received instructions for my participation in the procedure from the performing physician.

## 2023-09-16 ENCOUNTER — Telehealth: Payer: Self-pay

## 2023-09-16 NOTE — Telephone Encounter (Signed)
Follow up call to pt, no answer. 

## 2023-09-22 ENCOUNTER — Ambulatory Visit (HOSPITAL_COMMUNITY)
Admission: RE | Admit: 2023-09-22 | Discharge: 2023-09-22 | Disposition: A | Discharge status: Home / Self Care | Payer: Medicare Other | Source: Ambulatory Visit | Attending: Rheumatology | Admitting: Rheumatology

## 2023-09-22 DIAGNOSIS — Z79899 Other long term (current) drug therapy: Secondary | ICD-10-CM | POA: Insufficient documentation

## 2023-09-22 DIAGNOSIS — M0579 Rheumatoid arthritis with rheumatoid factor of multiple sites without organ or systems involvement: Secondary | ICD-10-CM | POA: Diagnosis not present

## 2023-09-22 LAB — CBC WITH DIFFERENTIAL/PLATELET
Abs Immature Granulocytes: 0.02 10*3/uL (ref 0.00–0.07)
Basophils Absolute: 0.1 10*3/uL (ref 0.0–0.1)
Basophils Relative: 1 %
Eosinophils Absolute: 0.7 10*3/uL — ABNORMAL HIGH (ref 0.0–0.5)
Eosinophils Relative: 13 %
HCT: 43.5 % (ref 39.0–52.0)
Hemoglobin: 14.1 g/dL (ref 13.0–17.0)
Immature Granulocytes: 0 %
Lymphocytes Relative: 16 %
Lymphs Abs: 0.9 10*3/uL (ref 0.7–4.0)
MCH: 29.7 pg (ref 26.0–34.0)
MCHC: 32.4 g/dL (ref 30.0–36.0)
MCV: 91.8 fL (ref 80.0–100.0)
Monocytes Absolute: 0.8 10*3/uL (ref 0.1–1.0)
Monocytes Relative: 15 %
Neutro Abs: 2.9 10*3/uL (ref 1.7–7.7)
Neutrophils Relative %: 55 %
Platelets: 149 10*3/uL — ABNORMAL LOW (ref 150–400)
RBC: 4.74 MIL/uL (ref 4.22–5.81)
RDW: 15.1 % (ref 11.5–15.5)
WBC: 5.3 10*3/uL (ref 4.0–10.5)
nRBC: 0 % (ref 0.0–0.2)

## 2023-09-22 LAB — COMPREHENSIVE METABOLIC PANEL
ALT: 24 U/L (ref 0–44)
AST: 31 U/L (ref 15–41)
Albumin: 4 g/dL (ref 3.5–5.0)
Alkaline Phosphatase: 52 U/L (ref 38–126)
Anion gap: 10 (ref 5–15)
BUN: 14 mg/dL (ref 8–23)
CO2: 28 mmol/L (ref 22–32)
Calcium: 9 mg/dL (ref 8.9–10.3)
Chloride: 101 mmol/L (ref 98–111)
Creatinine, Ser: 1.54 mg/dL — ABNORMAL HIGH (ref 0.61–1.24)
GFR, Estimated: 48 mL/min — ABNORMAL LOW (ref >60)
Glucose, Bld: 108 mg/dL — ABNORMAL HIGH (ref 70–99)
Potassium: 3.8 mmol/L (ref 3.5–5.1)
Sodium: 139 mmol/L (ref 135–145)
Total Bilirubin: 0.8 mg/dL (ref 0.3–1.2)
Total Protein: 6.7 g/dL (ref 6.5–8.1)

## 2023-09-22 MED ORDER — ACETAMINOPHEN 325 MG PO TABS: 650.0000 mg | ORAL_TABLET | ORAL | Status: DC

## 2023-09-22 MED ORDER — TOCILIZUMAB 400 MG/20ML IV SOLN
4.0000 mg/kg | INTRAVENOUS | Status: DC
  Administered 2023-09-22: 408 mg via INTRAVENOUS
  Filled 2023-09-22: qty 20.4

## 2023-09-22 MED ORDER — DIPHENHYDRAMINE HCL 25 MG PO CAPS: 25.0000 mg | ORAL_CAPSULE | ORAL | Status: DC

## 2023-09-22 NOTE — Progress Notes (Signed)
Creatinine remains elevated-stable. GFR is low-stable.  Rest of CMP WNL. Platelet count is borderline low-149K.  Absolute eosinophils remain elevated.  Rest of CBC Wnl.

## 2023-09-24 DIAGNOSIS — Z23 Encounter for immunization: Secondary | ICD-10-CM | POA: Diagnosis not present

## 2023-09-30 ENCOUNTER — Encounter: Payer: Self-pay | Admitting: Gastroenterology

## 2023-10-20 ENCOUNTER — Other Ambulatory Visit: Payer: Self-pay | Admitting: Pharmacist

## 2023-10-20 ENCOUNTER — Ambulatory Visit (HOSPITAL_COMMUNITY)
Admission: RE | Admit: 2023-10-20 | Discharge: 2023-10-20 | Disposition: A | Discharge status: Home / Self Care | Payer: Medicare Other | Source: Ambulatory Visit | Attending: Rheumatology | Admitting: Rheumatology

## 2023-10-20 DIAGNOSIS — M0579 Rheumatoid arthritis with rheumatoid factor of multiple sites without organ or systems involvement: Secondary | ICD-10-CM | POA: Insufficient documentation

## 2023-10-20 DIAGNOSIS — Z79899 Other long term (current) drug therapy: Secondary | ICD-10-CM | POA: Diagnosis not present

## 2023-10-20 MED ORDER — ACETAMINOPHEN 325 MG PO TABS: 650.0000 mg | ORAL_TABLET | ORAL | Status: DC

## 2023-10-20 MED ORDER — DIPHENHYDRAMINE HCL 25 MG PO CAPS: 25.0000 mg | ORAL_CAPSULE | ORAL | Status: DC

## 2023-10-20 MED ORDER — TOCILIZUMAB 400 MG/20ML IV SOLN
4.0000 mg/kg | INTRAVENOUS | Status: DC
  Administered 2023-10-20: 408 mg via INTRAVENOUS
  Filled 2023-10-20: qty 20

## 2023-10-20 NOTE — Progress Notes (Signed)
Next infusion scheduled for Actemra IV (G2952) on 11/17/23 and due for updated orders. Diagnosis: RA; leflunomide 20mg  daily  Dose: 4mg /kg every 4 weeks  Last Clinic Visit: 08/14/2023 Next Clinic Visit: 01/01/2024  Last infusion: 10/20/2023  Labs: CBC and CMP on 09/22/23 TB Gold: 02/07/2023 negative   Orders placed for Actemra IV (J3262) x 3 doses along with premedication of acetaminophen and diphenhydramine to be administered 30 minutes before medication infusion.  Standing CBC with diff/platelet and CMP with GFR orders placed to be drawn every 2 months.  Next TB gold due 02/08/2024  Chesley Mires, PharmD, MPH, BCPS, CPP Clinical Pharmacist (Rheumatology and Pulmonology)

## 2023-10-21 ENCOUNTER — Other Ambulatory Visit: Payer: Self-pay | Admitting: Cardiovascular Disease

## 2023-10-21 ENCOUNTER — Other Ambulatory Visit: Payer: Self-pay | Admitting: Rheumatology

## 2023-10-21 ENCOUNTER — Other Ambulatory Visit: Payer: Self-pay | Admitting: Physician Assistant

## 2023-10-22 DIAGNOSIS — E6609 Other obesity due to excess calories: Secondary | ICD-10-CM | POA: Diagnosis not present

## 2023-10-22 DIAGNOSIS — A46 Erysipelas: Secondary | ICD-10-CM | POA: Diagnosis not present

## 2023-10-22 DIAGNOSIS — Z6834 Body mass index (BMI) 34.0-34.9, adult: Secondary | ICD-10-CM | POA: Diagnosis not present

## 2023-10-30 ENCOUNTER — Telehealth: Payer: Self-pay | Admitting: *Deleted

## 2023-10-30 DIAGNOSIS — J449 Chronic obstructive pulmonary disease, unspecified: Secondary | ICD-10-CM | POA: Diagnosis not present

## 2023-10-30 DIAGNOSIS — D6869 Other thrombophilia: Secondary | ICD-10-CM | POA: Diagnosis not present

## 2023-10-30 DIAGNOSIS — I110 Hypertensive heart disease with heart failure: Secondary | ICD-10-CM | POA: Diagnosis not present

## 2023-10-30 NOTE — Telephone Encounter (Signed)
Please advise patient to see his PCP.

## 2023-10-30 NOTE — Telephone Encounter (Signed)
Patient advised to see his PCP. Patient expressed understanding.

## 2023-10-30 NOTE — Telephone Encounter (Signed)
Patient contacted the office and states he has been having pain in his neck for about a day and a half. Patient states he is unsure if it is his RA or possible muscle spasms. Patient describes the pain as an ache and a sharp pain if turned certain ways. Patient states he does not recall any injury. Patient states he has tried heat, ice, a neck brace, and one of his wife's muscle relaxers with no relief. Patient states his hands have a little pain in them as well. Patient states he has been holding his Arava due to being on antibiotics for the last 4 days. Patient states his last Actemra infusion was 10/20/2023. Patient would like to know if he should be seen here with PCP or what you recommend for follow up.

## 2023-11-03 ENCOUNTER — Encounter (HOSPITAL_COMMUNITY): Payer: Medicare Other

## 2023-11-07 DIAGNOSIS — Z7901 Long term (current) use of anticoagulants: Secondary | ICD-10-CM | POA: Diagnosis not present

## 2023-11-17 ENCOUNTER — Ambulatory Visit (HOSPITAL_COMMUNITY)
Admission: RE | Admit: 2023-11-17 | Discharge: 2023-11-17 | Disposition: A | Discharge status: Home / Self Care | Payer: Medicare Other | Source: Ambulatory Visit | Attending: Rheumatology | Admitting: Rheumatology

## 2023-11-17 DIAGNOSIS — M0579 Rheumatoid arthritis with rheumatoid factor of multiple sites without organ or systems involvement: Secondary | ICD-10-CM | POA: Diagnosis not present

## 2023-11-17 DIAGNOSIS — Z79899 Other long term (current) drug therapy: Secondary | ICD-10-CM | POA: Diagnosis not present

## 2023-11-17 LAB — CBC WITH DIFFERENTIAL/PLATELET
Abs Immature Granulocytes: 0 10*3/uL (ref 0.00–0.07)
Band Neutrophils: 5 %
Basophils Absolute: 0 10*3/uL (ref 0.0–0.1)
Basophils Relative: 0 %
Eosinophils Absolute: 2 10*3/uL — ABNORMAL HIGH (ref 0.0–0.5)
Eosinophils Relative: 22 %
HCT: 40.3 % (ref 39.0–52.0)
Hemoglobin: 12.9 g/dL — ABNORMAL LOW (ref 13.0–17.0)
Lymphocytes Relative: 11 %
Lymphs Abs: 1 10*3/uL (ref 0.7–4.0)
MCH: 29.1 pg (ref 26.0–34.0)
MCHC: 32 g/dL (ref 30.0–36.0)
MCV: 90.8 fL (ref 80.0–100.0)
Monocytes Absolute: 0.8 10*3/uL (ref 0.1–1.0)
Monocytes Relative: 9 %
Neutro Abs: 5.2 10*3/uL (ref 1.7–7.7)
Neutrophils Relative %: 53 %
Platelets: 161 10*3/uL (ref 150–400)
RBC: 4.44 MIL/uL (ref 4.22–5.81)
RDW: 15.2 % (ref 11.5–15.5)
WBC: 8.9 10*3/uL (ref 4.0–10.5)
nRBC: 0 % (ref 0.0–0.2)

## 2023-11-17 LAB — COMPREHENSIVE METABOLIC PANEL
ALT: 20 U/L (ref 0–44)
AST: 24 U/L (ref 15–41)
Albumin: 3.6 g/dL (ref 3.5–5.0)
Alkaline Phosphatase: 53 U/L (ref 38–126)
Anion gap: 9 (ref 5–15)
BUN: 18 mg/dL (ref 8–23)
CO2: 28 mmol/L (ref 22–32)
Calcium: 9.1 mg/dL (ref 8.9–10.3)
Chloride: 103 mmol/L (ref 98–111)
Creatinine, Ser: 1.62 mg/dL — ABNORMAL HIGH (ref 0.61–1.24)
GFR, Estimated: 45 mL/min — ABNORMAL LOW (ref >60)
Glucose, Bld: 114 mg/dL — ABNORMAL HIGH (ref 70–99)
Potassium: 3.9 mmol/L (ref 3.5–5.1)
Sodium: 140 mmol/L (ref 135–145)
Total Bilirubin: 0.6 mg/dL (ref <1.2)
Total Protein: 6 g/dL — ABNORMAL LOW (ref 6.5–8.1)

## 2023-11-17 MED ORDER — TOCILIZUMAB 400 MG/20ML IV SOLN
4.0000 mg/kg | INTRAVENOUS | Status: DC
  Administered 2023-11-17: 408 mg via INTRAVENOUS
  Filled 2023-11-17: qty 20

## 2023-11-17 MED ORDER — ACETAMINOPHEN 325 MG PO TABS: 650.0000 mg | ORAL_TABLET | ORAL | Status: DC

## 2023-11-17 MED ORDER — DIPHENHYDRAMINE HCL 25 MG PO CAPS: 25.0000 mg | ORAL_CAPSULE | ORAL | Status: DC

## 2023-11-17 NOTE — Progress Notes (Signed)
Hemoglobin is borderline low. Absolute eosinophils remain elevated. Rest of CBC WNL.   Creatinine is elevated-1.62 and GFR is low-45. Avoid the use of NSAIDs and forward results to nephrologist

## 2023-11-19 DIAGNOSIS — Z23 Encounter for immunization: Secondary | ICD-10-CM | POA: Diagnosis not present

## 2023-11-24 ENCOUNTER — Ambulatory Visit: Payer: Medicare Other | Attending: Cardiovascular Disease | Admitting: Cardiovascular Disease

## 2023-11-24 ENCOUNTER — Encounter: Payer: Self-pay | Admitting: Cardiovascular Disease

## 2023-11-24 VITALS — BP 149/78 | HR 67 | Ht 69.0 in | Wt 221.8 lb

## 2023-11-24 DIAGNOSIS — I87009 Postthrombotic syndrome without complications of unspecified extremity: Secondary | ICD-10-CM | POA: Diagnosis not present

## 2023-11-24 DIAGNOSIS — I50812 Chronic right heart failure: Secondary | ICD-10-CM

## 2023-11-24 DIAGNOSIS — I2781 Cor pulmonale (chronic): Secondary | ICD-10-CM | POA: Diagnosis not present

## 2023-11-24 DIAGNOSIS — M069 Rheumatoid arthritis, unspecified: Secondary | ICD-10-CM

## 2023-11-24 DIAGNOSIS — G4733 Obstructive sleep apnea (adult) (pediatric): Secondary | ICD-10-CM | POA: Diagnosis not present

## 2023-11-24 DIAGNOSIS — J449 Chronic obstructive pulmonary disease, unspecified: Secondary | ICD-10-CM | POA: Diagnosis not present

## 2023-11-24 DIAGNOSIS — D6869 Other thrombophilia: Secondary | ICD-10-CM

## 2023-11-24 DIAGNOSIS — I10 Essential (primary) hypertension: Secondary | ICD-10-CM | POA: Diagnosis not present

## 2023-11-24 NOTE — Patient Instructions (Signed)
Medication Instructions:  No changes *If you need a refill on your cardiac medications before your next appointment, please call your pharmacy*  Keep legs elevated as much as possible   Follow-Up: At [facility], you and your health needs are our priority.  As part of our continuing mission to provide you with exceptional heart care, we have created designated Provider Care Teams.  These Care Teams include your primary Cardiologist (physician) and Advanced Practice Providers (APPs -  Physician Assistants and Nurse Practitioners) who all work together to provide you with the care you need, when you need it.  We recommend signing up for the patient portal called "MyChart".  Sign up information is provided on this After Visit Summary.  MyChart is used to connect with patients for Virtual Visits (Telemedicine).  Patients are able to view lab/test results, encounter notes, upcoming appointments, etc.  Non-urgent messages can be sent to your provider as well.   To learn more about what you can do with MyChart, go to [email or web address].    Your next appointment:   1 year(s)  Provider:   Thurmon Fair, MD

## 2023-11-24 NOTE — Progress Notes (Signed)
Cardiology Office Note   Date:  12/01/2023   ID:  [name], DOB Dec 27, 1951, MRN [id number]  Patient Location: Home Provider Location: Office  PCP:  Assunta Found, MD  Cardiologist:  Thurmon Fair, MD  Electrophysiologist:  None   Evaluation Performed:  Follow-Up Visit  Chief Complaint:  edema  History of Present Illness:    [name] is a 72 y.o. male with postphlebitic syndrome, complicated by cellulitis of the right leg, including episode of sepsis with Serratia several years ago.  He is on immunosuppressive drugs for rheumatoid arthritis (Actemra).  In addition to his problems with venous drainage, he has right heart failure/cor pulmonale due to longstanding smoking with subsequent COPD as well as OSA on CPAP.  He continues to have lower extremity edema despite the fact that he is wearing moderate intensity, thigh-high compression hose.  As always the swelling is worse on the right than on the left.  He currently does not have any open wounds.  He denies fever/chills, weeping from the skin or blisters.  Continues to have a chronic cough productive of thick mucus suggesting of chronic bronchitis.  He is compliant with CPAP and he denies daytime hypersomnolence.  His blood pressure was a little high on arrival today and remained a little high when we checked it later, but this is atypical for him.  Last month his blood pressure was 138/72 in the doctor's office.    In March 2023 he underwent heart catheterization that showed no evidence of obstructive CAD.  His echo showed normal LV function and regional wall motion.  He improved with bronchodilators and steroids.   He has mild hypercholesterolemia with mildly elevated LDL but also with a very high HDL cholesterol level.  He does not have known coronary or peripheral vascular disease.  He is on chronic warfarin anticoagulation without serious bleeding complications, monitored in [facility].  He had normal  perfusion on a nuclear stress test in March 2020.  LVEF was reported at 48%, suspected to be artificially low due to diaphragmatic interference.  An echocardiogram performed in June 9 during his recent hospitalization showed a left ventricular ejection fraction of 55-60% and normal diastolic function, no significant valvular abnormalities.  He did not have any significant coronary stenoses at cardiac catheterization March 2023.    Past Medical History:  Diagnosis Date   Arthritis    RA   Asthma    CHF (congestive heart failure) (HCC)    pt denies    Clotting disorder (HCC)    DVT both legs    COPD (chronic obstructive pulmonary disease) (HCC)    DDD (degenerative disc disease), cervical    with UE's paresthesias   Diverticulosis    DVT, lower extremity (HCC)    bilat   Eye abnormality    right eye drifts has difficulty focusing 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 Unknown origin.   Shortness of breath dyspnea    exertion    Sleep apnea    cpap  Tubular adenoma of colon 04/2008   Past Surgical History:  Procedure Laterality Date   CHOLECYSTECTOMY     CIRCUMCISION     COLONOSCOPY     CYSTOSCOPY WITH LITHOLAPAXY N/A 03/17/2019   Procedure: CYSTOSCOPY WITH LITHOLAPAXY AND REMOVAL OF FOREIGN BODY;  Surgeon: Malen Gauze, MD;  Location: [facility];  Service: Urology;  Laterality: N/A;   DOPPLER ECHOCARDIOGRAPHY N/A 01-21-2012   TECHNICALLY DIFFICULT. MILD CONCENTRIC LV HYPERTROPHY. LV CAVITY IS SMALL.. EF=> 55%. TRANSMITRAL SPECTRAL FLOW PATTREN IS SUGGESTIVE OF IMPAIRED LV RELAXATION. RV SYSTOLIC PRESSURE IS . LEFT ATRIAL SIZE  IS NORMAL. AV APPEARS MILDLY SCLEROTIC. NO SIGN VALVE DISEASE NOTED.   LEFT HEART CATH AND CORONARY ANGIOGRAPHY N/A 03/12/2022   Procedure: LEFT HEART CATH AND CORONARY ANGIOGRAPHY;  Surgeon: Tonny Bollman, MD;  Location: [facility] INVASIVE CV LAB;  Service: Cardiovascular;  Laterality: N/A;   LYMPHADENECTOMY Bilateral 08/30/2015   Procedure: PELVIC LYMPHADENECTOMY;  Surgeon: Malen Gauze, MD;  Location: [facility];  Service: Urology;  Laterality: Bilateral;   NUCLEAR STRESS TEST N/A 01-21-2012   NORMAL PATTERN OF PERFUSION IN ALL REGIONS. POST STRESS LV SIZE IS NORMAL. NO EVIDENCE OF INDUCIBLE ISCHEMIA. EF 54%.   POLYPECTOMY     PROSTATE BIOPSY     ROBOT ASSISTED LAPAROSCOPIC RADICAL PROSTATECTOMY N/A 08/30/2015   Procedure: ROBOTIC ASSISTED LAPAROSCOPIC RADICAL PROSTATECTOMY;  Surgeon: Malen Gauze, MD;  Location: [facility];  Service: Urology;  Laterality: N/A;   URINARY SPHINCTER IMPLANT N/A 03/20/2021   Procedure: ARTIFICIAL URINARY SPHINCTER CYSTOSCOPY;  Surgeon: Alfredo Martinez, MD;  Location: [facility];  Service: Urology;  Laterality: N/A;  REQUESTING 90 MINS   US VENOUS LOWER EXT Right 03/07/11   PERSISTENT DVT IN RIGHT LOWER EXT.WITH PERSISTANT VISUALIZATION OF HYPOECHOIC THROMBUS WITHIN THE FEMORAL, PROFUNDA FEMORAL AND POPLITEAL VEINS. WHEN COMPARED TO PREVIOUS, CLOT IS NO LONGER IDENTIFIED WITH IN THE RIGHT CFV.     Current Meds  Medication Sig   allopurinol (ZYLOPRIM) 100 MG tablet Take 1 tablet (100 mg total) by mouth 2 (two) times daily. (Patient taking differently: Take 100 mg by mouth 2 (two) times daily. One daily)   ascorbic acid (VITAMIN C) 500 MG tablet Take 500 mg by mouth daily.   BREO ELLIPTA 100-25 MCG/ACT AEPB Inhale 1 puff into the lungs daily.   Budeson-Glycopyrrol-Formoterol (BREZTRI AEROSPHERE IN) Inhale 2 puffs into the lungs in the morning.   cholecalciferol (VITAMIN D3) 25 MCG (1000 UNIT) tablet Take 1,000 Units by mouth daily.   diclofenac Sodium (VOLTAREN) 1 % GEL  Apply 2-4 grams to affected joint 4 times daily as needed. (Patient taking differently: Apply 2-4 grams to affected joint 4 times daily as needed for pain)   dicyclomine (BENTYL) 10 MG capsule TAKE 1 CAPSULE BY MOUTH 4 TIMES DAILY BEFORE MEALS AND AT BEDTIME (Patient taking differently: Take 20 mg by mouth 2 (two) times daily.)   folic acid (FOLVITE) 1 MG tablet Take 1 mg by mouth daily.   furosemide (LASIX) 80 MG tablet TAKE 1 TABLET BY MOUTH EVERY DAY   leflunomide (ARAVA) 20 MG tablet TAKE 1 TABLET BY MOUTH EVERY DAY   mupirocin ointment (BACTROBAN) 2 %    omeprazole (PRILOSEC) 40 MG capsule Take 1 capsule (40 mg total) by mouth daily.   oxybutynin (DITROPAN-XL) 10 MG 24 hr tablet Take 1 tablet (10 mg total) by mouth daily.   phenytoin (DILANTIN) 100 MG ER capsule Take 100-200 mg by mouth See admin instructions. Take one capsule in the morning  and 2 capsules at bedtime   Potassium Chloride ER 20 MEQ TBCR Take 20 mEq by mouth daily.   rosuvastatin (CRESTOR) 10 MG tablet Take 10 mg by mouth daily.   spironolactone (ALDACTONE) 50 MG tablet TAKE 1 TABLET BY MOUTH EVERY DAY   Tocilizumab (ACTEMRA IV) Inject into the vein every 28 (twenty-eight) days.   Vibegron (GEMTESA) 75 MG TABS Take 1 tablet (75 mg total) by mouth daily.   warfarin (COUMADIN) 3 MG tablet Take 1 tablet (3 mg total) by mouth at bedtime. Restart on 3/24 (48 hours after surgery)     Allergies:   Orencia [abatacept], Carbamazepine, Celecoxib, Cephalexin, Enbrel [etanercept], Humira [adalimumab], Levofloxacin, Sulfa antibiotics, and Sulfasalazine   Social History   Tobacco Use   Smoking status: Former    Current packs/day: 0.00    Average packs/day: 3.0 packs/day for 20.0 years (60.0 ttl pk-yrs)    Types: Cigarettes    Start date: 12/30/1976    Quit date: 12/30/1996    Years since quitting: 26.9    Passive exposure: Never   Smokeless tobacco: Never  Vaping Use   Vaping status: Never Used  Substance Use Topics   Alcohol use:  No    Alcohol/week: 0.0 standard drinks of alcohol   Drug use: No     Family Hx: The patient's family history includes Alzheimer's disease in his mother; Bladder Cancer in his sister; Breast cancer in his sister; Colon cancer in his brother and brother; Esophageal cancer in his brother; Heart attack in his brother, brother, father, and sister; Heart murmur in his sister; Lung cancer in his brother; Skin cancer in his father; Stomach cancer in his brother and father; Stroke in his sister; Throat cancer in his brother. There is no history of Colon polyps or Rectal cancer.  ROS:   Please see the history of present illness.    All other systems reviewed and are negative.   Prior CV studies:   The following studies were reviewed today:  Cardiac catheterization 03/12/2022 Patent coronary arteries with no significant coronary obstructive disease.  Suspect noncardiac chest pain.  Medical therapy recommended.  Echocardiogram 03/13/2022  1. Left ventricular ejection fraction, by estimation, is 55 to 60%. The  left ventricle has normal function. The left ventricle has no regional  wall motion abnormalities. Left ventricular diastolic parameters were  normal.   2. Right ventricular systolic function is normal. The right ventricular  size is normal. Tricuspid regurgitation signal is inadequate for assessing  PA pressure.   3. The mitral valve is normal in structure. No evidence of mitral valve  regurgitation. No evidence of mitral stenosis.   4. The aortic valve was not well visualized. Aortic valve regurgitation  is not visualized. No aortic stenosis is present.   Labs/Other Tests and Data Reviewed:    EKG:   EKG Interpretation Date/Time:  Monday November 24 2023 14:08:49 EST Ventricular Rate:  67 PR Interval:  128 QRS Duration:  88 QT Interval:  404 QTC Calculation: 426 R Axis:   49  Text Interpretation: Normal sinus rhythm with sinus arrhythmia Nonspecific ST abnormality When  compared with ECG of 13-Mar-2022 01:54, No significant change was found Confirmed by Arath Kaigler [phone]) on 11/24/2023 2:20:50 PM         Recent Labs: 01/23/2023: B Natriuretic Peptide 21.0; Magnesium 1.5 11/17/2023: ALT 20; BUN 18; Creatinine, Ser 1.62; Hemoglobin 12.9; Platelets 161; Potassium 3.9; Sodium 140   Recent Lipid Panel Lab Results  Component Value Date/Time  CHOL 202 (H) 02/01/2022 11:18 AM   TRIG 156 (H) 02/01/2022 11:18 AM   HDL 64 02/01/2022 11:18 AM   CHOLHDL 3.2 02/01/2022 11:18 AM   LDLCALC 107 (H) 02/01/2022 11:18 AM    Wt Readings from Last 3 Encounters:  11/24/23 221 lb 12.8 oz (100.6 kg)  11/17/23 226 lb (102.5 kg)  10/20/23 225 lb (102.1 kg)     Objective:    Vital Signs:  BP (!) 149/78   Pulse 67   Ht 5\' 9"  (1.753 m)   Wt 221 lb 12.8 oz (100.6 kg)   SpO2 95%   BMI 32.75 kg/m      General: Alert, oriented x3, no distress, moderately obese Head: no evidence of trauma, PERRL, EOMI, no exophtalmos or lid lag, no myxedema, no xanthelasma; normal ears, nose and oropharynx Neck: normal jugular venous pulsations and no hepatojugular reflux; brisk carotid pulses without delay and no carotid bruits Chest: clear to auscultation, no signs of consolidation by percussion or palpation, normal fremitus, symmetrical and full respiratory excursions Cardiovascular: normal position and quality of the apical impulse, regular rhythm, normal first and second heart sounds, no murmurs, rubs or gallops Abdomen: no tenderness or distention, no masses by palpation, no abnormal pulsatility or arterial bruits, normal bowel sounds, no hepatosplenomegaly Extremities: Chronic discoloration and trophic changes of the skin in both calves, 3+ pitting edema on the right, 2+ pitting edema on the left, several superficial sores on the right side, covered with Band-Aids. Neurological: grossly nonfocal Psych: Normal mood and affect    ASSESSMENT & PLAN:    1. Postphlebitic  syndrome   2. Rheumatoid arthritis of hip, unspecified laterality, unspecified whether rheumatoid factor present (HCC)   3. Chronic obstructive pulmonary disease, unspecified COPD type (HCC)   4. OSA (obstructive sleep apnea)   5. Cor pulmonale, chronic (HCC)   6. Chronic right-sided heart failure (HCC)   7. Essential hypertension   8. Acquired thrombophilia (HCC)        Peripheral venous insufficiency/postphlebitic syndrome: I encouraged him to continue wearing compression stockings and keeping his legs elevated as much as possible.  He has postphlebitic syndrome after bilateral DVTs.  He is on chronic warfarin since his antiseizure medications interact with direct oral anticoagulants. RA: Symptoms are pretty well-controlled on Actemra.  He has improved mobility. COPD: Has typical symptoms of chronic bronchitis.  Has not had acute exacerbation in about a year's time. OSA: Reports compliance with CPAP and denies daytime hypersomnolence. RHF/chronic cor pulmonale: As far as I can tell he is clinically euvolemic.  We discussed the fact that the increased right heart filling pressures worsen his tendency to develop peripheral edema from postphlebitic syndrome.  Reviewed the importance of sodium restricted diet, daily weight monitoring, the possibility that we can adjust diuretic doses if necessary.   HTN: Mildly elevated but this seems to be a unusual event.  No changes made to his medications today.  Will monitor for now: Asked him to keep a log of blood pressure at home. Anticoagulation: Monitored by Dr. Phillips Odor in [place].  No bleeding complications.     Medication Adjustments/Labs and Tests Ordered: Current medicines are reviewed at length with the patient today.  Concerns regarding medicines are outlined above.   Tests Ordered: Orders Placed This Encounter  Procedures   EKG 12-Lead    Medication Changes: No orders of the defined types were placed in this encounter.  Patient  Instructions  Medication Instructions:  No changes *If you need a refill  on your cardiac medications before your next appointment, please call your pharmacy*  Keep legs elevated as much as possible   Follow-Up: At [facility], you and your health needs are our priority.  As part of our continuing mission to provide you with exceptional heart care, we have created designated Provider Care Teams.  These Care Teams include your primary Cardiologist (physician) and Advanced Practice Providers (APPs -  Physician Assistants and Nurse Practitioners) who all work together to provide you with the care you need, when you need it.  We recommend signing up for the patient portal called "MyChart".  Sign up information is provided on this After Visit Summary.  MyChart is used to connect with patients for Virtual Visits (Telemedicine).  Patients are able to view lab/test results, encounter notes, upcoming appointments, etc.  Non-urgent messages can be sent to your provider as well.   To learn more about what you can do with MyChart, go to [email or web address].    Your next appointment:   1 year(s)  Provider:   Thurmon Fair, MD      Signed, Thurmon Fair, MD  12/01/2023 5:12 PM    [facility]

## 2023-11-25 ENCOUNTER — Telehealth: Payer: Self-pay | Admitting: *Deleted

## 2023-11-25 NOTE — Telephone Encounter (Signed)
Attempted to contact the patient. Unable to leave a message, mailbox is full.  

## 2023-11-25 NOTE — Telephone Encounter (Signed)
Arava should not be discontinued during flares.  We have discussed holding arava if he has signs or symptoms of an infection in the past. Does he have a current infection?   Please clarify if he would like to restart PT which has been helpful in the past?

## 2023-11-25 NOTE — Telephone Encounter (Signed)
Patient contacted the office stating he was advised after his last labs not to take NSAIDS any longer.  Patient states he had been taking those to help with the pain in his neck. Patient states now that he has stopped taking the NSAIDS the pain has returned in his neck. Patient states he took his last Actemra infusion about 2 weeks ago. Patient states he has not taken Arava for about 1 month now. Patient states he was instructed "not to take it when I am flaring". Patient would like to know what he can do for the pain in his neck. Please advise.

## 2023-11-26 NOTE — Telephone Encounter (Signed)
Attempted to contact the patient.Unable to leave a message, mailbox is full.

## 2023-11-26 NOTE — Telephone Encounter (Signed)
Attempted to contact the patient, recording comes on stating that calls from office phone number are being blocked.

## 2023-11-29 DIAGNOSIS — J449 Chronic obstructive pulmonary disease, unspecified: Secondary | ICD-10-CM | POA: Diagnosis not present

## 2023-11-29 DIAGNOSIS — I119 Hypertensive heart disease without heart failure: Secondary | ICD-10-CM | POA: Diagnosis not present

## 2023-11-29 DIAGNOSIS — D6869 Other thrombophilia: Secondary | ICD-10-CM | POA: Diagnosis not present

## 2023-12-01 ENCOUNTER — Encounter: Payer: Self-pay | Admitting: Cardiovascular Disease

## 2023-12-10 ENCOUNTER — Other Ambulatory Visit: Payer: Self-pay | Admitting: Physician Assistant

## 2023-12-10 NOTE — Telephone Encounter (Signed)
Last Fill: 08/28/2023  Labs: 11/17/2023 Hemoglobin is borderline low. Absolute eosinophils remain elevated. Rest of CBC WNL.   Creatinine is elevated-1.62 and GFR is low-45. Avoid the use of NSAIDs and forward results to nephrologist  Next Visit: 01/01/2024  Last Visit: 08/14/2023  DX: Rheumatoid arthritis involving multiple sites with positive rheumatoid factor   Current Dose per office note 08/14/2023: Arava 20 mg 1 tablet by mouth daily.   Okay to refill Arava ?

## 2023-12-15 ENCOUNTER — Ambulatory Visit (HOSPITAL_COMMUNITY)
Admission: RE | Admit: 2023-12-15 | Discharge: 2023-12-15 | Disposition: A | Discharge status: Home / Self Care | Payer: Medicare Other | Source: Ambulatory Visit | Attending: Rheumatology | Admitting: Rheumatology

## 2023-12-15 DIAGNOSIS — M0579 Rheumatoid arthritis with rheumatoid factor of multiple sites without organ or systems involvement: Secondary | ICD-10-CM | POA: Insufficient documentation

## 2023-12-15 DIAGNOSIS — Z79899 Other long term (current) drug therapy: Secondary | ICD-10-CM | POA: Diagnosis not present

## 2023-12-15 MED ORDER — TOCILIZUMAB 400 MG/20ML IV SOLN
4.0000 mg/kg | INTRAVENOUS | Status: DC
  Administered 2023-12-15: 400 mg via INTRAVENOUS
  Filled 2023-12-15: qty 20

## 2023-12-15 MED ORDER — DIPHENHYDRAMINE HCL 25 MG PO CAPS: 25.0000 mg | ORAL_CAPSULE | ORAL | Status: DC

## 2023-12-15 MED ORDER — ACETAMINOPHEN 325 MG PO TABS: 650.0000 mg | ORAL_TABLET | ORAL | Status: DC

## 2023-12-17 ENCOUNTER — Other Ambulatory Visit: Payer: Self-pay | Admitting: Cardiovascular Disease

## 2023-12-18 NOTE — Progress Notes (Deleted)
Office Visit Note  Patient: [name]             Date of Birth: 09-06-1951           MRN: [id number]             PCP: Assunta Found, MD Referring: Assunta Found, MD Visit Date: 01/01/2024 Occupation: @GUAROCC @  Subjective:  No chief complaint on file.   History of Present Illness: [name] is a 72 y.o. male ***     Activities of Daily Living:  Patient reports morning stiffness for *** {minute/hour:19697}.   Patient {ACTIONS;DENIES/REPORTS:21021675::"Denies"} nocturnal pain.  Difficulty dressing/grooming: {ACTIONS;DENIES/REPORTS:21021675::"Denies"} Difficulty climbing stairs: {ACTIONS;DENIES/REPORTS:21021675::"Denies"} Difficulty getting out of chair: {ACTIONS;DENIES/REPORTS:21021675::"Denies"} Difficulty using hands for taps, buttons, cutlery, and/or writing: {ACTIONS;DENIES/REPORTS:21021675::"Denies"}  No Rheumatology ROS completed.   PMFS History:  Patient Active Problem List   Diagnosis Date Noted   Rectal bleeding 08/27/2023   Acute respiratory failure with hypoxia (HCC) 01/23/2023   Diaphragm paralysis 08/28/2022   Chest pain 03/11/2022   Stage 3a chronic kidney disease (CKD) (HCC) 03/11/2022   Bronchiectasis without complication (HCC) 03/26/2021   Incontinence when straining, male 03/20/2021   Erectile dysfunction after radical prostatectomy 09/08/2020   OAB (overactive bladder) 02/23/2020   Class 2 severe obesity due to excess calories with serious comorbidity and body mass index (BMI) of 35.0 to 35.9 in adult [facility])    Chronic diastolic HF (heart failure) (HCC)    Serratia sepsis (HCC) 06/08/2019   Voice hoarseness 04/26/2019   OSA (obstructive sleep apnea) 03/10/2019   Abnormal CT of the chest 02/09/2019   Dyspnea on exertion 10/22/2017   History of seizure disorder 02/27/2017   Primary osteoarthritis of both hands 02/27/2017   Primary osteoarthritis of both feet 02/27/2017   Idiopathic gout of multiple sites 02/27/2017   High risk  medication use 12/29/2016   History of prostate cancer 12/29/2016   Primary osteoarthritis of both knees 12/29/2016   Chronic idiopathic gout involving toe without tophus 12/29/2016   History of CHF (congestive heart failure) 12/29/2016   History of COPD 12/29/2016   Plantar pustular psoriasis 12/29/2016   DVT (deep venous thrombosis) (HCC) 10/31/2016   COPD with acute exacerbation (HCC) 10/31/2016   CHF (congestive heart failure) (HCC) 10/31/2016   Prostate cancer (HCC) 08/30/2015   Malignant neoplasm of prostate (HCC) 07/04/2015   Chest pain at rest 07/05/2014   Weakness 07/05/2014   Complicated postphlebitic syndrome 11/16/2013   Personal history of DVT (deep vein thrombosis) 05/18/2013   Chronic anticoagulation 05/18/2013   Diverticulosis of colon without hemorrhage 05/18/2013   Rheumatoid arthritis (HCC) 05/18/2013   Seizure disorder (HCC) 05/18/2013   History of colonic polyps 05/18/2013   GERD 05/08/2010   NAUSEA 05/08/2010   FLATULENCE-GAS-BLOATING 05/08/2010    Past Medical History:  Diagnosis Date   Arthritis    RA   Asthma    CHF (congestive heart failure) (HCC)    pt denies    Clotting disorder (HCC)    DVT both legs    COPD (chronic obstructive pulmonary disease) (HCC)    DDD (degenerative disc disease), cervical    with UE's paresthesias   Diverticulosis    DVT, lower extremity (HCC)    bilat   Eye abnormality    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 Unknown origin.   Shortness of breath dyspnea    exertion    Sleep apnea    cpap    Tubular adenoma of colon 04/2008     Family History  Problem Relation Age of Onset   Alzheimer's disease Mother    Skin cancer Father    Stomach cancer Father    Heart attack Father    Breast cancer Sister    Heart attack Sister    Stroke Sister    Bladder Cancer Sister    Heart murmur Sister    Esophageal cancer Brother    Throat cancer Brother    Stomach cancer Brother    Lung cancer Brother    Colon cancer Brother    Heart attack Brother    Heart attack Brother    Colon cancer Brother    Colon polyps Neg Hx    Rectal cancer Neg Hx    Past Surgical History:  Procedure Laterality Date   CHOLECYSTECTOMY     CIRCUMCISION     COLONOSCOPY     CYSTOSCOPY WITH LITHOLAPAXY N/A 03/17/2019   Procedure: CYSTOSCOPY WITH LITHOLAPAXY AND REMOVAL OF FOREIGN BODY;  Surgeon: Malen Gauze, MD;  Location: [facility];  Service: Urology;  Laterality: N/A;   DOPPLER ECHOCARDIOGRAPHY N/A 01-21-2012   TECHNICALLY DIFFICULT. MILD CONCENTRIC LV HYPERTROPHY. LV CAVITY IS SMALL.. EF=> 55%. TRANSMITRAL SPECTRAL FLOW PATTREN IS SUGGESTIVE OF IMPAIRED LV RELAXATION. RV SYSTOLIC PRESSURE IS . LEFT ATRIAL SIZE IS NORMAL. AV APPEARS MILDLY SCLEROTIC. NO SIGN VALVE DISEASE NOTED.   LEFT HEART CATH AND CORONARY ANGIOGRAPHY N/A 03/12/2022   Procedure: LEFT HEART CATH AND CORONARY ANGIOGRAPHY;  Surgeon: Tonny Bollman, MD;  Location: [facility] INVASIVE CV LAB;  Service: Cardiovascular;  Laterality: N/A;   LYMPHADENECTOMY Bilateral 08/30/2015   Procedure: PELVIC LYMPHADENECTOMY;  Surgeon: Malen Gauze, MD;  Location: [facility];  Service: Urology;  Laterality: Bilateral;   NUCLEAR STRESS TEST N/A 01-21-2012   NORMAL PATTERN OF PERFUSION IN ALL REGIONS. POST STRESS LV SIZE IS NORMAL. NO EVIDENCE OF INDUCIBLE ISCHEMIA. EF 54%.   POLYPECTOMY     PROSTATE BIOPSY     ROBOT ASSISTED LAPAROSCOPIC RADICAL PROSTATECTOMY N/A 08/30/2015   Procedure: ROBOTIC ASSISTED LAPAROSCOPIC RADICAL PROSTATECTOMY;  Surgeon: Malen Gauze, MD;  Location: [facility];   Service: Urology;  Laterality: N/A;   URINARY SPHINCTER IMPLANT N/A 03/20/2021   Procedure: ARTIFICIAL URINARY SPHINCTER CYSTOSCOPY;  Surgeon: Alfredo Martinez, MD;  Location: [facility];  Service: Urology;  Laterality: N/A;  REQUESTING 90 MINS   US VENOUS LOWER EXT Right 03/07/11   PERSISTENT DVT IN RIGHT LOWER EXT.WITH PERSISTANT VISUALIZATION OF HYPOECHOIC THROMBUS WITHIN THE FEMORAL, PROFUNDA FEMORAL AND POPLITEAL VEINS. WHEN COMPARED TO PREVIOUS, CLOT IS NO LONGER IDENTIFIED WITH IN THE RIGHT CFV.   Social History   Social History Narrative   Not on file   Immunization History  Administered Date(s) Administered   Fluad Quad(high Dose 65+) 09/30/2019   Influenza, High Dose Seasonal PF 10/05/2018, 10/10/2021   Influenza,inj,Quad PF,6+ Mos 08/31/2015   Influenza-Unspecified 09/30/2016, 08/22/2017   Moderna Sars-Covid-2 Vaccination 01/21/2020, 02/18/2020, 10/25/2020, 10/10/2021   Pneumococcal Conjugate-13 10/05/2018   Pneumococcal-Unspecified 12/30/2012  Td 07/05/1996     Objective: Vital Signs: There were no vitals taken for this visit.   Physical Exam   Musculoskeletal Exam: ***  CDAI Exam: CDAI Score: -- Patient Global: --; Provider Global: -- Swollen: --; Tender: -- Joint Exam 01/01/2024   No joint exam has been documented for this visit   There is currently no information documented on the homunculus. Go to the Rheumatology activity and complete the homunculus joint exam.  Investigation: No additional findings.  Imaging: No results found.  Recent Labs: Lab Results  Component Value Date   WBC 8.9 11/17/2023   HGB 12.9 (L) 11/17/2023   PLT 161 11/17/2023   NA 140 11/17/2023   K 3.9 11/17/2023   CL 103 11/17/2023   CO2 28 11/17/2023   GLUCOSE 114 (H) 11/17/2023   BUN 18 11/17/2023   CREATININE 1.62 (H) 11/17/2023   BILITOT 0.6 11/17/2023   ALKPHOS 53 11/17/2023   AST 24 11/17/2023   ALT 20 11/17/2023   PROT 6.0 (L) 11/17/2023   ALBUMIN 3.6 11/17/2023    CALCIUM 9.1 11/17/2023   GFRAA >60 09/20/2020   QFTBGOLDPLUS Negative 02/07/2023    Speciality Comments: ACTEMRA 4mg /kg x 4 weeks PPD negative 01/02/17  Procedures:  No procedures performed Allergies: Orencia [abatacept], Carbamazepine, Celecoxib, Cephalexin, Enbrel [etanercept], Humira [adalimumab], Levofloxacin, Sulfa antibiotics, and Sulfasalazine   Assessment / Plan:     Visit Diagnoses: No diagnosis found.  Orders: No orders of the defined types were placed in this encounter.  No orders of the defined types were placed in this encounter.   Face-to-face time spent with patient was *** minutes. Greater than 50% of time was spent in counseling and coordination of care.  Follow-Up Instructions: No follow-ups on file.   Ellen Henri, CMA  Note - This record has been created using Animal nutritionist.  Chart creation errors have been sought, but may not always  have been located. Such creation errors do not reflect on  the standard of medical care.

## 2023-12-20 DIAGNOSIS — Z6832 Body mass index (BMI) 32.0-32.9, adult: Secondary | ICD-10-CM | POA: Diagnosis not present

## 2023-12-20 DIAGNOSIS — J019 Acute sinusitis, unspecified: Secondary | ICD-10-CM | POA: Diagnosis not present

## 2023-12-20 DIAGNOSIS — I10 Essential (primary) hypertension: Secondary | ICD-10-CM | POA: Diagnosis not present

## 2023-12-20 DIAGNOSIS — E669 Obesity, unspecified: Secondary | ICD-10-CM | POA: Diagnosis not present

## 2023-12-20 DIAGNOSIS — U071 COVID-19: Secondary | ICD-10-CM | POA: Diagnosis not present

## 2023-12-20 DIAGNOSIS — J101 Influenza due to other identified influenza virus with other respiratory manifestations: Secondary | ICD-10-CM | POA: Diagnosis not present

## 2023-12-26 ENCOUNTER — Telehealth: Payer: Self-pay | Admitting: Pharmacist

## 2023-12-26 ENCOUNTER — Emergency Department (HOSPITAL_COMMUNITY)
Admission: EM | Admit: 2023-12-26 | Discharge: 2023-12-27 | Disposition: A | Discharge status: Home / Self Care | Payer: Medicare Other | Attending: Emergency Medicine | Admitting: Emergency Medicine

## 2023-12-26 ENCOUNTER — Other Ambulatory Visit: Payer: Self-pay

## 2023-12-26 ENCOUNTER — Encounter (HOSPITAL_COMMUNITY): Payer: Self-pay | Admitting: *Deleted

## 2023-12-26 ENCOUNTER — Emergency Department (HOSPITAL_COMMUNITY): Payer: Medicare Other

## 2023-12-26 DIAGNOSIS — J189 Pneumonia, unspecified organism: Secondary | ICD-10-CM | POA: Diagnosis not present

## 2023-12-26 DIAGNOSIS — Z8546 Personal history of malignant neoplasm of prostate: Secondary | ICD-10-CM | POA: Insufficient documentation

## 2023-12-26 DIAGNOSIS — Z20822 Contact with and (suspected) exposure to covid-19: Secondary | ICD-10-CM | POA: Insufficient documentation

## 2023-12-26 DIAGNOSIS — R9389 Abnormal findings on diagnostic imaging of other specified body structures: Secondary | ICD-10-CM | POA: Diagnosis not present

## 2023-12-26 DIAGNOSIS — J209 Acute bronchitis, unspecified: Secondary | ICD-10-CM

## 2023-12-26 DIAGNOSIS — J069 Acute upper respiratory infection, unspecified: Secondary | ICD-10-CM | POA: Diagnosis not present

## 2023-12-26 DIAGNOSIS — R058 Other specified cough: Secondary | ICD-10-CM | POA: Diagnosis not present

## 2023-12-26 DIAGNOSIS — Z7901 Long term (current) use of anticoagulants: Secondary | ICD-10-CM | POA: Insufficient documentation

## 2023-12-26 DIAGNOSIS — R059 Cough, unspecified: Secondary | ICD-10-CM | POA: Diagnosis present

## 2023-12-26 DIAGNOSIS — Z7951 Long term (current) use of inhaled steroids: Secondary | ICD-10-CM | POA: Insufficient documentation

## 2023-12-26 DIAGNOSIS — I509 Heart failure, unspecified: Secondary | ICD-10-CM | POA: Diagnosis not present

## 2023-12-26 DIAGNOSIS — J449 Chronic obstructive pulmonary disease, unspecified: Secondary | ICD-10-CM | POA: Insufficient documentation

## 2023-12-26 LAB — CBC WITH DIFFERENTIAL/PLATELET
Abs Immature Granulocytes: 0.25 10*3/uL — ABNORMAL HIGH (ref 0.00–0.07)
Basophils Absolute: 0.1 10*3/uL (ref 0.0–0.1)
Basophils Relative: 1 %
Eosinophils Absolute: 0.2 10*3/uL (ref 0.0–0.5)
Eosinophils Relative: 1 %
HCT: 44.7 % (ref 39.0–52.0)
Hemoglobin: 14.4 g/dL (ref 13.0–17.0)
Immature Granulocytes: 2 %
Lymphocytes Relative: 20 %
Lymphs Abs: 2.6 10*3/uL (ref 0.7–4.0)
MCH: 29 pg (ref 26.0–34.0)
MCHC: 32.2 g/dL (ref 30.0–36.0)
MCV: 89.9 fL (ref 80.0–100.0)
Monocytes Absolute: 1.4 10*3/uL — ABNORMAL HIGH (ref 0.1–1.0)
Monocytes Relative: 11 %
Neutro Abs: 8.6 10*3/uL — ABNORMAL HIGH (ref 1.7–7.7)
Neutrophils Relative %: 65 %
Platelets: 206 10*3/uL (ref 150–400)
RBC: 4.97 MIL/uL (ref 4.22–5.81)
RDW: 15.1 % (ref 11.5–15.5)
WBC: 13.1 10*3/uL — ABNORMAL HIGH (ref 4.0–10.5)
nRBC: 0 % (ref 0.0–0.2)

## 2023-12-26 LAB — BASIC METABOLIC PANEL
Anion gap: 12 (ref 5–15)
BUN: 20 mg/dL (ref 8–23)
CO2: 30 mmol/L (ref 22–32)
Calcium: 9.8 mg/dL (ref 8.9–10.3)
Chloride: 99 mmol/L (ref 98–111)
Creatinine, Ser: 1.45 mg/dL — ABNORMAL HIGH (ref 0.61–1.24)
GFR, Estimated: 51 mL/min — ABNORMAL LOW (ref >60)
Glucose, Bld: 148 mg/dL — ABNORMAL HIGH (ref 70–99)
Potassium: 3.5 mmol/L (ref 3.5–5.1)
Sodium: 141 mmol/L (ref 135–145)

## 2023-12-26 MED ORDER — SODIUM CHLORIDE 0.9 % IV SOLN
3.0000 g | Freq: Once | INTRAVENOUS | Status: AC
  Administered 2023-12-26: 3 g via INTRAVENOUS
  Filled 2023-12-26: qty 8

## 2023-12-26 NOTE — Telephone Encounter (Signed)
Called patient to confirm if any anticipated insurance changes in 2025. Patient confirms no changes to insurance in 2025. He will continue to have Medicare + [employer] Plan F supplement   Medication: IV Orencia   Medicare covers 80% of the infusion and no authorization is required, and the supplement would cover the 20% of the cost that was not paid for by Medicare as long as Medicare covered the medication?after patient pays Medicare B deductible.?    Chesley Mires, PharmD, MPH, BCPS, CPP Clinical Pharmacist (Rheumatology and Pulmonology)

## 2023-12-26 NOTE — ED Provider Notes (Signed)
[place] EMERGENCY DEPARTMENT AT [facility] Provider Note   CSN: [id number] Arrival date & time: 12/26/23  1452     History  Chief Complaint  Patient presents with   Pneumonia    [name] is a 72 y.o. male.  Patient is a 72 year old male with past medical history of COPD, prostate cancer, DVT, CHF.  Patient presenting today with complaints of cough, congestion, and feeling unwell for the past 7 days.  Patient just completed a course of Zithromax prescribed for the same illness, but symptoms are not improving.  He was seen at urgent care, then sent here for consideration of admission.  Patient denies to me he is having any chest pain or difficulty breathing.  He has had negative COVID, flu, strep swabs at urgent care.  His cough is productive of a yellow to brown sputum.  The history is provided by the patient.       Home Medications Prior to Admission medications   Medication Sig Start Date End Date Taking? Authorizing Provider  acetaminophen (TYLENOL) 650 MG CR tablet Take 1,300 mg by mouth every 8 (eight) hours as needed for pain. Patient not taking: Reported on 11/24/2023    [provider]  albuterol (PROVENTIL) (2.5 MG/3ML) 0.083% nebulizer solution Take 3 mLs (2.5 mg total) by nebulization every 2 (two) hours as needed for wheezing or shortness of breath. Patient not taking: Reported on 11/24/2023 01/25/23   Shon Hale, MD  allopurinol (ZYLOPRIM) 100 MG tablet Take 1 tablet (100 mg total) by mouth 2 (two) times daily. Patient taking differently: Take 100 mg by mouth 2 (two) times daily. One daily 02/12/23   Gearldine Bienenstock, PA-C  ascorbic acid (VITAMIN C) 500 MG tablet Take 500 mg by mouth daily.    [provider]  BREO ELLIPTA 100-25 MCG/ACT AEPB Inhale 1 puff into the lungs daily. 10/21/23   [provider]  Budeson-Glycopyrrol-Formoterol (BREZTRI AEROSPHERE IN) Inhale 2 puffs into the lungs in the morning.    [provider]  cholecalciferol (VITAMIN D3) 25 MCG (1000 UNIT) tablet Take 1,000 Units by mouth daily.    [provider]  diclofenac Sodium (VOLTAREN) 1 % GEL Apply 2-4 grams to affected joint 4 times daily as needed. Patient taking differently: Apply 2-4 grams to affected joint 4 times daily as needed for pain 03/02/20   Gearldine Bienenstock, PA-C  dicyclomine (BENTYL) 10 MG capsule TAKE 1 CAPSULE BY MOUTH 4 TIMES DAILY BEFORE MEALS AND AT BEDTIME Patient taking differently: Take 20 mg by mouth 2 (two) times daily. 08/11/17   Meryl Dare, MD  folic acid (FOLVITE) 1 MG tablet Take 1 mg by mouth daily.    [provider]  furosemide (LASIX) 80 MG tablet TAKE 1 TABLET BY MOUTH EVERY DAY 08/19/22   Croitoru, Mihai, MD  leflunomide (ARAVA) 20 MG tablet TAKE 1 TABLET BY MOUTH EVERY DAY 12/10/23   Pollyann Savoy, MD  mupirocin ointment (BACTROBAN) 2 %  06/02/23   [provider]  omeprazole (PRILOSEC) 40 MG capsule Take 1 capsule (40 mg total) by mouth daily. 01/25/23   Shon Hale, MD  oxybutynin (DITROPAN-XL) 10 MG 24 hr tablet Take 1 tablet (10 mg total) by mouth daily. 03/15/22   McKenzie, Mardene Celeste, MD  phenytoin (DILANTIN) 100 MG ER capsule Take 100-200 mg by mouth See admin instructions. Take one capsule in the morning and 2 capsules at bedtime    [provider]  Potassium  Chloride ER 20 MEQ TBCR Take 20 mEq by mouth daily. 02/24/19   [provider]  rosuvastatin (CRESTOR) 10 MG tablet Take 10 mg by mouth daily. 06/14/20   [provider]  spironolactone (ALDACTONE) 50 MG tablet TAKE 1 TABLET BY MOUTH EVERY DAY 12/17/23   Croitoru, Mihai, MD  Tocilizumab (ACTEMRA IV) Inject into the vein every 28 (twenty-eight) days.    [provider]  Vibegron (GEMTESA) 75 MG TABS Take 1 tablet (75 mg total) by mouth daily. 03/21/23   McKenzie, Mardene Celeste, MD  warfarin (COUMADIN) 3 MG tablet Take 1 tablet (3 mg total) by mouth at bedtime. Restart on  3/24 (48 hours after surgery) 03/22/21   Alfredo Martinez, MD      Allergies    Orencia [abatacept], Carbamazepine, Celecoxib, Cephalexin, Enbrel [etanercept], Humira [adalimumab], Levofloxacin, Sulfa antibiotics, and Sulfasalazine    Review of Systems   Review of Systems  All other systems reviewed and are negative.   Physical Exam Updated Vital Signs BP (!) 169/84   Pulse 78   Temp 98 F (36.7 C) (Oral)   Resp 16   Ht 5\' 9"  (1.753 m)   Wt 99.8 kg   SpO2 92%   BMI 32.49 kg/m  Physical Exam Vitals and nursing note reviewed.  Constitutional:      General: He is not in acute distress.    Appearance: He is well-developed. He is not diaphoretic.  HENT:     Head: Normocephalic and atraumatic.  Cardiovascular:     Rate and Rhythm: Normal rate and regular rhythm.     Heart sounds: No murmur heard.    No friction rub.  Pulmonary:     Effort: Pulmonary effort is normal. No respiratory distress.     Breath sounds: Normal breath sounds. No wheezing or rales.  Abdominal:     General: Bowel sounds are normal. There is no distension.     Palpations: Abdomen is soft.     Tenderness: There is no abdominal tenderness.  Musculoskeletal:        General: Normal range of motion.     Cervical back: Normal range of motion and neck supple.  Skin:    General: Skin is warm and dry.  Neurological:     Mental Status: He is alert and oriented to person, place, and time.     Coordination: Coordination normal.     ED Results / Procedures / Treatments   Labs (all labs ordered are listed, but only abnormal results are displayed) Labs Reviewed  CBC WITH DIFFERENTIAL/PLATELET - Abnormal; Notable for the following components:      Result Value   WBC 13.1 (*)    Neutro Abs 8.6 (*)    Monocytes Absolute 1.4 (*)    Abs Immature Granulocytes 0.25 (*)    All other components within normal limits  BASIC METABOLIC PANEL - Abnormal; Notable for the following components:   Glucose, Bld 148 (*)     Creatinine, Ser 1.45 (*)    GFR, Estimated 51 (*)    All other components within normal limits    EKG None  Radiology DG Chest 2 View Result Date: 12/26/2023 CLINICAL DATA:  Productive cough, pneumonia EXAM: CHEST - 2 VIEW COMPARISON:  12/26/2023 FINDINGS: Frontal and lateral views of the chest demonstrate chronic elevation of the left hemidiaphragm. Cardiac silhouette is unremarkable. Since the exam performed earlier today, there is improved aeration of the lungs with resolution of the streaky consolidation seen previously. No acute airspace  disease, effusion, or pneumothorax. No acute bony abnormalities. IMPRESSION: 1. Improved aeration of the lungs, with resolution of the streaky atelectasis seen previously. 2. No acute intrathoracic process. 3. Stable elevation of the left hemidiaphragm. Electronically Signed   By: Sharlet Salina M.D.   On: 12/26/2023 19:55    Procedures Procedures  {Document cardiac monitor, telemetry assessment procedure when appropriate:1}  Medications Ordered in ED Medications  Ampicillin-Sulbactam (UNASYN) 3 g in sodium chloride 0.9 % 100 mL IVPB (has no administration in time range)    ED Course/ Medical Decision Making/ A&P   {   Click here for ABCD2, HEART and other calculatorsREFRESH Note before signing :1}                              Medical Decision Making  ***  {Document critical care time when appropriate:1} {Document review of labs and clinical decision tools ie heart score, Chads2Vasc2 etc:1}  {Document your independent review of radiology images, and any outside records:1} {Document your discussion with family members, caretakers, and with consultants:1} {Document social determinants of health affecting pt's care:1} {Document your decision making why or why not admission, treatments were needed:1} Final Clinical Impression(s) / ED Diagnoses Final diagnoses:  None    Rx / DC Orders ED Discharge Orders     None

## 2023-12-26 NOTE — ED Triage Notes (Addendum)
Pt seen at [facility] and dx'd with PNA in both lungs and sent here for possible admission. Pt is on antibiotics after seen by UC in [place] last week. Getting worse, + productive cough- yellow and brown in color.  Also c/o fluid out of left ear.

## 2023-12-27 MED ORDER — GUAIFENESIN-CODEINE 100-10 MG/5ML PO SOLN: 10.0000 mL | Freq: Four times a day (QID) | ORAL | 0 refills | Status: DC | PRN

## 2023-12-27 MED ORDER — AMOXICILLIN-POT CLAVULANATE 500-125 MG PO TABS: 1.0000 | ORAL_TABLET | Freq: Three times a day (TID) | ORAL | 0 refills | Status: DC

## 2023-12-27 NOTE — Discharge Instructions (Signed)
Begin taking Augmentin as prescribed.  Begin taking Robitussin with codeine as prescribed as needed for cough.  Primarily take this medication at night as it may make you drowsy.  Follow-up with primary doctor and return to the ER if your symptoms significantly worsen or change.

## 2024-01-01 ENCOUNTER — Ambulatory Visit: Payer: Medicare Other | Admitting: Rheumatology

## 2024-01-01 DIAGNOSIS — M19041 Primary osteoarthritis, right hand: Secondary | ICD-10-CM

## 2024-01-01 DIAGNOSIS — Z5181 Encounter for therapeutic drug level monitoring: Secondary | ICD-10-CM

## 2024-01-01 DIAGNOSIS — Z8709 Personal history of other diseases of the respiratory system: Secondary | ICD-10-CM

## 2024-01-01 DIAGNOSIS — M170 Bilateral primary osteoarthritis of knee: Secondary | ICD-10-CM

## 2024-01-01 DIAGNOSIS — Z8679 Personal history of other diseases of the circulatory system: Secondary | ICD-10-CM

## 2024-01-01 DIAGNOSIS — Z8719 Personal history of other diseases of the digestive system: Secondary | ICD-10-CM

## 2024-01-01 DIAGNOSIS — Z79899 Other long term (current) drug therapy: Secondary | ICD-10-CM

## 2024-01-01 DIAGNOSIS — M1A09X0 Idiopathic chronic gout, multiple sites, without tophus (tophi): Secondary | ICD-10-CM

## 2024-01-01 DIAGNOSIS — Z860101 Personal history of adenomatous and serrated colon polyps: Secondary | ICD-10-CM

## 2024-01-01 DIAGNOSIS — Z86718 Personal history of other venous thrombosis and embolism: Secondary | ICD-10-CM

## 2024-01-01 DIAGNOSIS — M0579 Rheumatoid arthritis with rheumatoid factor of multiple sites without organ or systems involvement: Secondary | ICD-10-CM

## 2024-01-01 DIAGNOSIS — M810 Age-related osteoporosis without current pathological fracture: Secondary | ICD-10-CM

## 2024-01-01 DIAGNOSIS — Z8546 Personal history of malignant neoplasm of prostate: Secondary | ICD-10-CM

## 2024-01-01 DIAGNOSIS — M19071 Primary osteoarthritis, right ankle and foot: Secondary | ICD-10-CM

## 2024-01-01 DIAGNOSIS — Z8669 Personal history of other diseases of the nervous system and sense organs: Secondary | ICD-10-CM

## 2024-01-07 DIAGNOSIS — H6500 Acute serous otitis media, unspecified ear: Secondary | ICD-10-CM | POA: Diagnosis not present

## 2024-01-07 DIAGNOSIS — H608X3 Other otitis externa, bilateral: Secondary | ICD-10-CM | POA: Diagnosis not present

## 2024-01-07 DIAGNOSIS — E6609 Other obesity due to excess calories: Secondary | ICD-10-CM | POA: Diagnosis not present

## 2024-01-07 DIAGNOSIS — Z6833 Body mass index (BMI) 33.0-33.9, adult: Secondary | ICD-10-CM | POA: Diagnosis not present

## 2024-01-07 DIAGNOSIS — J069 Acute upper respiratory infection, unspecified: Secondary | ICD-10-CM | POA: Diagnosis not present

## 2024-01-12 ENCOUNTER — Encounter (HOSPITAL_COMMUNITY): Payer: Medicare Other

## 2024-01-12 NOTE — Progress Notes (Unsigned)
Office Visit Note  Patient: [name]             Date of Birth: 08/02/1951           MRN: [id number]             PCP: Assunta Found, MD Referring: Assunta Found, MD Visit Date: 01/26/2024 Occupation: @GUAROCC @  Subjective:  Neck pain and stiffness   History of Present Illness: [name] is a 73 y.o. male with history of seropositive rheumatoid arthritis, osteoporosis, and gout.  Patient is prescribed  Actemra 4mg /kg IV infusions every 4 weeks and Arava 20 mg 1 tablet by mouth daily.  Patient reports that he has been holding both medications while he has been recovering from acute bronchitis.  Patient states that he has rescheduled his Actemra infusion to be administered next Monday.  Patient denies any signs or symptoms of a flare during his gap in therapy.  He continues to have pain and stiffness in his cervical spine.  He has not been performing home exercises recently and thinks he may have misplaced the exercises given to him at his last visit.  He denies any recent gout flares and remains on allopurinol as prescribed.  He denies any new medical conditions.   Activities of Daily Living:  Patient reports morning stiffness for 0 minutes.   Patient Reports nocturnal pain.  Difficulty dressing/grooming: Denies Difficulty climbing stairs: Denies Difficulty getting out of chair: Denies Difficulty using hands for taps, buttons, cutlery, and/or writing: Denies  Review of Systems  Constitutional:  Negative for fatigue.  HENT:  Positive for sore tongue. Negative for mouth sores, mouth dryness and nose dryness.   Eyes:  Negative for pain, visual disturbance and dryness.  Respiratory:  Positive for shortness of breath. Negative for difficulty breathing.        On exertion  Cardiovascular:  Negative for chest pain and palpitations.  Gastrointestinal:  Positive for blood in stool. Negative for constipation and diarrhea.       History of hemorrhoids   Endocrine: Negative for  increased urination.  Genitourinary:  Negative for involuntary urination.  Musculoskeletal:  Positive for joint pain and joint pain. Negative for gait problem, joint swelling, myalgias, muscle weakness, morning stiffness, muscle tenderness and myalgias.  Skin:  Negative for color change, rash and sensitivity to sunlight.  Allergic/Immunologic: Positive for susceptible to infections.  Neurological:  Negative for dizziness and headaches.  Hematological:  Negative for swollen glands.  Psychiatric/Behavioral:  Negative for depressed mood and sleep disturbance. The patient is not nervous/anxious.     PMFS History:  Patient Active Problem List   Diagnosis Date Noted   Rectal bleeding 08/27/2023   Acute respiratory failure with hypoxia (HCC) 01/23/2023   Diaphragm paralysis 08/28/2022   Chest pain 03/11/2022   Stage 3a chronic kidney disease (CKD) (HCC) 03/11/2022   Bronchiectasis without complication (HCC) 03/26/2021   Incontinence when straining, male 03/20/2021   Erectile dysfunction after radical prostatectomy 09/08/2020   OAB (overactive bladder) 02/23/2020   Class 2 severe obesity due to excess calories with serious comorbidity and body mass index (BMI) of 35.0 to 35.9 in adult [facility])    Chronic diastolic HF (heart failure) (HCC)    Serratia sepsis (HCC) 06/08/2019   Voice hoarseness 04/26/2019   OSA (obstructive sleep apnea) 03/10/2019   Abnormal CT of the chest 02/09/2019   Dyspnea on exertion 10/22/2017   History of seizure disorder 02/27/2017   Primary osteoarthritis of both hands 02/27/2017  Primary osteoarthritis of both feet 02/27/2017   Idiopathic gout of multiple sites 02/27/2017   High risk medication use 12/29/2016   History of prostate cancer 12/29/2016   Primary osteoarthritis of both knees 12/29/2016   Chronic idiopathic gout involving toe without tophus 12/29/2016   History of CHF (congestive heart failure) 12/29/2016   History of COPD 12/29/2016   Plantar  pustular psoriasis 12/29/2016   DVT (deep venous thrombosis) (HCC) 10/31/2016   COPD with acute exacerbation (HCC) 10/31/2016   CHF (congestive heart failure) (HCC) 10/31/2016   Prostate cancer (HCC) 08/30/2015   Malignant neoplasm of prostate (HCC) 07/04/2015   Chest pain at rest 07/05/2014   Weakness 07/05/2014   Complicated postphlebitic syndrome 11/16/2013   Personal history of DVT (deep vein thrombosis) 05/18/2013   Chronic anticoagulation 05/18/2013   Diverticulosis of colon without hemorrhage 05/18/2013   Rheumatoid arthritis (HCC) 05/18/2013   Seizure disorder (HCC) 05/18/2013   History of colonic polyps 05/18/2013   GERD 05/08/2010   NAUSEA 05/08/2010   FLATULENCE-GAS-BLOATING 05/08/2010    Past Medical History:  Diagnosis Date   Arthritis    RA   Asthma    CHF (congestive heart failure) (HCC)    pt denies    Clotting disorder (HCC)    DVT both legs    COPD (chronic obstructive pulmonary disease) (HCC)    DDD (degenerative disc disease), cervical    with UE's paresthesias   Diverticulosis    DVT, lower extremity (HCC)    bilat   Eye abnormality    right eye drifts has difficulty focusing 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 Unknown origin.   Shortness of breath dyspnea    exertion    Sleep apnea    cpap    Tubular adenoma of colon 04/2008    Family History  Problem Relation Age of Onset   Alzheimer's disease Mother    Skin cancer Father    Stomach cancer Father    Heart attack Father    Breast cancer Sister    Heart attack Sister    Stroke Sister    Bladder Cancer Sister    Heart murmur Sister     Esophageal cancer Brother    Throat cancer Brother    Stomach cancer Brother    Lung cancer Brother    Colon cancer Brother    Heart attack Brother    Heart attack Brother    Colon cancer Brother    Colon polyps Neg Hx    Rectal cancer Neg Hx    Past Surgical History:  Procedure Laterality Date   CHOLECYSTECTOMY     CIRCUMCISION     COLONOSCOPY     CYSTOSCOPY WITH LITHOLAPAXY N/A 03/17/2019   Procedure: CYSTOSCOPY WITH LITHOLAPAXY AND REMOVAL OF FOREIGN BODY;  Surgeon: Malen Gauze, MD;  Location: [facility];  Service: Urology;  Laterality: N/A;   DOPPLER ECHOCARDIOGRAPHY N/A 01-21-2012   TECHNICALLY DIFFICULT. MILD CONCENTRIC LV HYPERTROPHY. LV CAVITY IS SMALL.. EF=> 55%. TRANSMITRAL SPECTRAL FLOW PATTREN IS SUGGESTIVE  OF IMPAIRED LV RELAXATION. RV SYSTOLIC PRESSURE IS . LEFT ATRIAL SIZE IS NORMAL. AV APPEARS MILDLY SCLEROTIC. NO SIGN VALVE DISEASE NOTED.   LEFT HEART CATH AND CORONARY ANGIOGRAPHY N/A 03/12/2022   Procedure: LEFT HEART CATH AND CORONARY ANGIOGRAPHY;  Surgeon: Tonny Bollman, MD;  Location: [facility] INVASIVE CV LAB;  Service: Cardiovascular;  Laterality: N/A;   LYMPHADENECTOMY Bilateral 08/30/2015   Procedure: PELVIC LYMPHADENECTOMY;  Surgeon: Malen Gauze, MD;  Location: [facility];  Service: Urology;  Laterality: Bilateral;   NUCLEAR STRESS TEST N/A 01-21-2012   NORMAL PATTERN OF PERFUSION IN ALL REGIONS. POST STRESS LV SIZE IS NORMAL. NO EVIDENCE OF INDUCIBLE ISCHEMIA. EF 54%.   POLYPECTOMY     PROSTATE BIOPSY     ROBOT ASSISTED LAPAROSCOPIC RADICAL PROSTATECTOMY N/A 08/30/2015   Procedure: ROBOTIC ASSISTED LAPAROSCOPIC RADICAL PROSTATECTOMY;  Surgeon: Malen Gauze, MD;  Location: [facility];  Service: Urology;  Laterality: N/A;   URINARY SPHINCTER IMPLANT N/A 03/20/2021   Procedure: ARTIFICIAL URINARY SPHINCTER CYSTOSCOPY;  Surgeon: Alfredo Martinez, MD;  Location: [facility];  Service: Urology;  Laterality: N/A;  REQUESTING 90 MINS   US VENOUS LOWER EXT Right  03/07/11   PERSISTENT DVT IN RIGHT LOWER EXT.WITH PERSISTANT VISUALIZATION OF HYPOECHOIC THROMBUS WITHIN THE FEMORAL, PROFUNDA FEMORAL AND POPLITEAL VEINS. WHEN COMPARED TO PREVIOUS, CLOT IS NO LONGER IDENTIFIED WITH IN THE RIGHT CFV.   Social History   Social History Narrative   Not on file   Immunization History  Administered Date(s) Administered   Fluad Quad(high Dose 65+) 09/30/2019   Influenza, High Dose Seasonal PF 10/05/2018, 10/10/2021   Influenza,inj,Quad PF,6+ Mos 08/31/2015   Influenza-Unspecified 09/30/2016, 08/22/2017   Moderna Sars-Covid-2 Vaccination 01/21/2020, 02/18/2020, 10/25/2020, 10/10/2021   Pneumococcal Conjugate-13 10/05/2018   Pneumococcal-Unspecified 12/30/2012   Td 07/05/1996     Objective: Vital Signs: BP (!) 164/84 (BP Location: Left Arm, Patient Position: Sitting, Cuff Size: Large)   Pulse 70   Resp 15   Ht 5\' 9"  (1.753 m)   Wt 222 lb 9.6 oz (101 kg)   BMI 32.87 kg/m    Physical Exam Vitals and nursing note reviewed.  Constitutional:      Appearance: He is well-developed.  HENT:     Head: Normocephalic and atraumatic.  Eyes:     Conjunctiva/sclera: Conjunctivae normal.     Pupils: Pupils are equal, round, and reactive to light.  Cardiovascular:     Rate and Rhythm: Normal rate and regular rhythm.     Heart sounds: Normal heart sounds.  Pulmonary:     Effort: Pulmonary effort is normal.     Breath sounds: Normal breath sounds.  Abdominal:     General: Bowel sounds are normal.     Palpations: Abdomen is soft.  Musculoskeletal:     Cervical back: Normal range of motion and neck supple.  Skin:    General: Skin is warm and dry.     Capillary Refill: Capillary refill takes less than 2 seconds.  Neurological:     Mental Status: He is alert and oriented to person, place, and time.  Psychiatric:        Behavior: Behavior normal.      Musculoskeletal Exam: C-spine has limited range of motion especially with lateral rotation to the left and  with extension.  Thoracic kyphosis noted. Shoulder joints have good ROM with some discomfort and stiffness in the right shoulder.  Elbow joints have good ROM.  Wrist joints have good ROM.  Synovial thickening of the right first  MCP joint.  PIP and DIP thickening noted.  Complete fist formation noted bilaterally.  Knee joints have good range of motion with no warmth or effusion.  Severe pedal edema noted in bilateral lower extremities, right greater than left.  CDAI Exam: CDAI Score: -- Patient Global: --; Provider Global: -- Swollen: --; Tender: -- Joint Exam 01/26/2024   No joint exam has been documented for this visit   There is currently no information documented on the homunculus. Go to the Rheumatology activity and complete the homunculus joint exam.  Investigation: No additional findings.  Imaging: No results found.   Recent Labs: Lab Results  Component Value Date   WBC 13.1 (H) 12/26/2023   HGB 14.4 12/26/2023   PLT 206 12/26/2023   NA 141 12/26/2023   K 3.5 12/26/2023   CL 99 12/26/2023   CO2 30 12/26/2023   GLUCOSE 148 (H) 12/26/2023   BUN 20 12/26/2023   CREATININE 1.45 (H) 12/26/2023   BILITOT 0.6 11/17/2023   ALKPHOS 53 11/17/2023   AST 24 11/17/2023   ALT 20 11/17/2023   PROT 6.0 (L) 11/17/2023   ALBUMIN 3.6 11/17/2023   CALCIUM 9.8 12/26/2023   GFRAA >60 09/20/2020   QFTBGOLDPLUS Negative 02/07/2023    Speciality Comments: ACTEMRA 4mg /kg x 4 weeks PPD negative 01/02/17  Procedures:  No procedures performed Allergies: Orencia [abatacept], Carbamazepine, Celecoxib, Cephalexin, Enbrel [etanercept], Humira [adalimumab], Levofloxacin, Sulfa antibiotics, and Sulfasalazine    Assessment / Plan:     Visit Diagnoses: Rheumatoid arthritis involving multiple sites with positive rheumatoid factor (HCC) -He has no synovitis on examination today.  He has not had any signs or symptoms of a rheumatoid arthritis flare.  He is prescribed Actemra 4 mg/kg IV infusions  every 4 weeks and Arava 20 mg 1 tablet by mouth daily.  He has been holding both medications while recovering from acute bronchitis.  He has postponed his Actemra infusion until next Monday.  He is no longer on antibiotics and his cough has been significantly improving.  He was advised to notify us if he starts to have recurrent infections.  He was advised to notify us if he develops any signs or symptoms of a flare.  He will follow-up in the office in 3 months or sooner if needed.  Plan: Lipid panel  High risk medication use - Actemra 4mg /kg IV infusions every 4 weeks and Arava 20 mg 1 tablet by mouth daily.  Discussed the importance of holding actemra and arava if he develops signs or symptoms of an infection and to resume once the infection has completely cleared.  CBC and BMP updated on 12/26/23.  TB gold negative on 02/07/23. Order for TB gold released.  Lipid panel updated today. Patient is taking crestor as prescribed.  - Plan: Lipid panel, QuantiFERON-TB Gold Plus  Screening for tuberculosis -Future order for TB gold placed today.   Plan: QuantiFERON-TB Gold Plus  History of hyperlipidemia - Future order for lipid panel placed today.  Plan: Lipid panel  Age-related osteoporosis without current pathological fracture - DEXA updated on 02/19/2023: Left femoral neck BMD 0.632 with T-score -2.2.  Statistically significant increase in the left hip.  Lumbar and hip stable.  Due to update DEXA in February 2026.  Idiopathic chronic gout of multiple sites without tophus -He has not had any signs or symptoms of a gout flare.  He has clinically been doing well taking allopurinol 100 mg 1 tablet by mouth daily.  Future order for uric acid level placed  today to be drawn with upcoming lab work.- Plan: Uric acid  Primary osteoarthritis of both hands: Patient has PIP and DIP thickening consistent with osteoarthritis of both hands.  No tenderness or synovitis noted.  Primary osteoarthritis of both knees: Good  range of motion of both knee joints on examination today.  No warmth or effusion noted.  Primary osteoarthritis of both feet: He is not experiencing any increased discomfort in his feet at this time.  Other medical conditions are listed as follows:   History of abscessed tooth  History of COPD  History of adenomatous polyp of colon  History of seizure disorder  History of CHF (congestive heart failure)  History of prostate cancer  History of DVT (deep vein thrombosis)    Orders: Orders Placed This Encounter  Procedures   Lipid panel   QuantiFERON-TB Gold Plus   Uric acid   No orders of the defined types were placed in this encounter.     Follow-Up Instructions: Return in about 3 months (around 04/25/2024) for Rheumatoid arthritis.   Gearldine Bienenstock, PA-C  Note - This record has been created using Dragon software.  Chart creation errors have been sought, but may not always  have been located. Such creation errors do not reflect on  the standard of medical care.

## 2024-01-17 ENCOUNTER — Other Ambulatory Visit: Payer: Self-pay | Admitting: Internal Medicine

## 2024-01-20 ENCOUNTER — Other Ambulatory Visit: Payer: Self-pay | Admitting: Rheumatology

## 2024-01-22 DIAGNOSIS — H6121 Impacted cerumen, right ear: Secondary | ICD-10-CM | POA: Diagnosis not present

## 2024-01-22 DIAGNOSIS — H903 Sensorineural hearing loss, bilateral: Secondary | ICD-10-CM | POA: Diagnosis not present

## 2024-01-22 DIAGNOSIS — H6993 Unspecified Eustachian tube disorder, bilateral: Secondary | ICD-10-CM | POA: Diagnosis not present

## 2024-01-26 ENCOUNTER — Encounter: Payer: Self-pay | Admitting: Physician Assistant

## 2024-01-26 ENCOUNTER — Ambulatory Visit: Payer: Medicare Other | Attending: Physician Assistant | Admitting: Physician Assistant

## 2024-01-26 VITALS — BP 164/84 | HR 70 | Resp 15 | Ht 69.0 in | Wt 222.6 lb

## 2024-01-26 DIAGNOSIS — Z8719 Personal history of other diseases of the digestive system: Secondary | ICD-10-CM | POA: Diagnosis not present

## 2024-01-26 DIAGNOSIS — M170 Bilateral primary osteoarthritis of knee: Secondary | ICD-10-CM | POA: Insufficient documentation

## 2024-01-26 DIAGNOSIS — M1A09X0 Idiopathic chronic gout, multiple sites, without tophus (tophi): Secondary | ICD-10-CM | POA: Diagnosis not present

## 2024-01-26 DIAGNOSIS — M19041 Primary osteoarthritis, right hand: Secondary | ICD-10-CM | POA: Diagnosis not present

## 2024-01-26 DIAGNOSIS — M19042 Primary osteoarthritis, left hand: Secondary | ICD-10-CM | POA: Diagnosis not present

## 2024-01-26 DIAGNOSIS — M810 Age-related osteoporosis without current pathological fracture: Secondary | ICD-10-CM | POA: Insufficient documentation

## 2024-01-26 DIAGNOSIS — Z111 Encounter for screening for respiratory tuberculosis: Secondary | ICD-10-CM | POA: Insufficient documentation

## 2024-01-26 DIAGNOSIS — Z8669 Personal history of other diseases of the nervous system and sense organs: Secondary | ICD-10-CM | POA: Diagnosis not present

## 2024-01-26 DIAGNOSIS — M19072 Primary osteoarthritis, left ankle and foot: Secondary | ICD-10-CM | POA: Insufficient documentation

## 2024-01-26 DIAGNOSIS — Z8546 Personal history of malignant neoplasm of prostate: Secondary | ICD-10-CM | POA: Insufficient documentation

## 2024-01-26 DIAGNOSIS — Z8639 Personal history of other endocrine, nutritional and metabolic disease: Secondary | ICD-10-CM | POA: Insufficient documentation

## 2024-01-26 DIAGNOSIS — M0579 Rheumatoid arthritis with rheumatoid factor of multiple sites without organ or systems involvement: Secondary | ICD-10-CM | POA: Insufficient documentation

## 2024-01-26 DIAGNOSIS — Z8709 Personal history of other diseases of the respiratory system: Secondary | ICD-10-CM | POA: Insufficient documentation

## 2024-01-26 DIAGNOSIS — Z79899 Other long term (current) drug therapy: Secondary | ICD-10-CM | POA: Diagnosis not present

## 2024-01-26 DIAGNOSIS — Z86718 Personal history of other venous thrombosis and embolism: Secondary | ICD-10-CM | POA: Insufficient documentation

## 2024-01-26 DIAGNOSIS — Z8679 Personal history of other diseases of the circulatory system: Secondary | ICD-10-CM | POA: Diagnosis not present

## 2024-01-26 DIAGNOSIS — Z860101 Personal history of adenomatous and serrated colon polyps: Secondary | ICD-10-CM | POA: Insufficient documentation

## 2024-01-26 DIAGNOSIS — M19071 Primary osteoarthritis, right ankle and foot: Secondary | ICD-10-CM | POA: Diagnosis not present

## 2024-01-26 NOTE — Patient Instructions (Signed)
Neck Exercises Ask your health care provider which exercises are safe for you. Do exercises exactly as told by your health care provider and adjust them as directed. It is normal to feel mild stretching, pulling, tightness, or discomfort as you do these exercises. Stop right away if you feel sudden pain or your pain gets worse. Do not begin these exercises until told by your health care provider. Neck exercises can be important for many reasons. They can improve strength and maintain flexibility in your neck, which will help your upper back and prevent neck pain. Stretching exercises Rotation neck stretching  Sit in a chair or stand up. Place your feet flat on the floor, shoulder-width apart. Slowly turn your head (rotate) to the right until a slight stretch is felt. Turn it all the way to the right so you can look over your right shoulder. Do not tilt or tip your head. Hold this position for 10-30 seconds. Slowly turn your head (rotate) to the left until a slight stretch is felt. Turn it all the way to the left so you can look over your left shoulder. Do not tilt or tip your head. Hold this position for 10-30 seconds. Repeat __________ times. Complete this exercise __________ times a day. Neck retraction  Sit in a sturdy chair or stand up. Look straight ahead. Do not bend your neck. Use your fingers to push your chin backward (retraction). Do not bend your neck for this movement. Continue to face straight ahead. If you are doing the exercise properly, you will feel a slight sensation in your throat and a stretch at the back of your neck. Hold the stretch for 1-2 seconds. Repeat __________ times. Complete this exercise __________ times a day. Strengthening exercises Neck press  Lie on your back on a firm bed or on the floor with a pillow under your head. Use your neck muscles to push your head down on the pillow and straighten your spine. Hold the position as well as you can. Keep your head  facing up (in a neutral position) and your chin tucked. Slowly count to 5 while holding this position. Repeat __________ times. Complete this exercise __________ times a day. Isometrics These are exercises in which you strengthen the muscles in your neck while keeping your neck still (isometrics). Sit in a supportive chair and place your hand on your forehead. Keep your head and face facing straight ahead. Do not flex or extend your neck while doing isometrics. Push forward with your head and neck while pushing back with your hand. Hold for 10 seconds. Do the sequence again, this time putting your hand against the back of your head. Use your head and neck to push backward against the hand pressure. Finally, do the same exercise on either side of your head, pushing sideways against the pressure of your hand. Repeat __________ times. Complete this exercise __________ times a day. Prone head lifts  Lie face-down (prone position), resting on your elbows so that your chest and upper back are raised. Start with your head facing downward, near your chest. Position your chin either on or near your chest. Slowly lift your head upward. Lift until you are looking straight ahead. Then continue lifting your head as far back as you can comfortably stretch. Hold your head up for 5 seconds. Then slowly lower it to your starting position. Repeat __________ times. Complete this exercise __________ times a day. Supine head lifts  Lie on your back (supine position), bending your knees  to point to the ceiling and keeping your feet flat on the floor. Lift your head slowly off the floor, raising your chin toward your chest. Hold for 5 seconds. Repeat __________ times. Complete this exercise __________ times a day. Scapular retraction  Stand with your arms at your sides. Look straight ahead. Slowly pull both shoulders (scapulae) backward and downward (retraction) until you feel a stretch between your shoulder  blades in your upper back. Hold for 10-30 seconds. Relax and repeat. Repeat __________ times. Complete this exercise __________ times a day. Contact a health care provider if: Your neck pain or discomfort gets worse when you do an exercise. Your neck pain or discomfort does not improve within 2 hours after you exercise. If you have any of these problems, stop exercising right away. Do not do the exercises again unless your health care provider says that you can. Get help right away if: You develop sudden, severe neck pain. If this happens, stop exercising right away. Do not do the exercises again unless your health care provider says that you can. This information is not intended to replace advice given to you by your health care provider. Make sure you discuss any questions you have with your health care provider. Document Revised: 06/12/2021 Document Reviewed: 06/12/2021 Elsevier Patient Education  2024 [employer].

## 2024-01-28 ENCOUNTER — Other Ambulatory Visit: Payer: Self-pay | Admitting: Internal Medicine

## 2024-01-28 ENCOUNTER — Other Ambulatory Visit (HOSPITAL_BASED_OUTPATIENT_CLINIC_OR_DEPARTMENT_OTHER): Payer: Self-pay | Admitting: Internal Medicine

## 2024-01-28 NOTE — Telephone Encounter (Signed)
PT is out of Breo. He knows he is OD for an appt. We made one today.  [facility] [place]   Can he have some samples? Please call when ready for pick up.

## 2024-01-28 NOTE — Telephone Encounter (Signed)
 Copied from CRM [id number]. Topic: Clinical - Medication Refill >> Jan 28, 2024 10:03 AM [name] wrote: Most Recent Primary Care Visit:   Medication: BREO ELLIPTA 100-25 MCG/ACT AEPB  Has the patient contacted their pharmacy? Yes (Agent: If no, request that the patient contact the pharmacy for the refill. If patient does not wish to contact the pharmacy document the reason why and proceed with request.) (Agent: If yes, when and what did the pharmacy advise?)  Is this the correct pharmacy for this prescription? Yes If no, delete pharmacy and type the correct one.  This is the patient's preferred pharmacy:  [id number] [place], [place] - [address] [place] [address] Phone: [phone] Fax: [phone]    Has the prescription been filled recently? No  Is the patient out of the medication? Yes  Has the patient been seen for an appointment in the last year OR does the patient have an upcoming appointment? No  Can we respond through MyChart? Yes  Agent: Please be advised that Rx refills may take up to 3 business days. We ask that you follow-up with your pharmacy.

## 2024-01-28 NOTE — Telephone Encounter (Signed)
This RN contacted patient regarding refill request. Advised patient he would have to contact prescribing provider for refill. Patient verbalized understanding, denies further questions.  Copied from CRM [id number]. Topic: Clinical - Medication Refill >> Jan 28, 2024 10:03 AM [name] wrote: Most Recent Primary Care Visit:   Medication: BREO ELLIPTA 100-25 MCG/ACT AEPB  Has the patient contacted their pharmacy? Yes (Agent: If no, request that the patient contact the pharmacy for the refill. If patient does not wish to contact the pharmacy document the reason why and proceed with request.) (Agent: If yes, when and what did the pharmacy advise?)  Is this the correct pharmacy for this prescription? Yes If no, delete pharmacy and type the correct one.  This is the patient's preferred pharmacy:  [id number] [place], [place] - [address] [place] [address] Phone: [phone] Fax: [phone]    Has the prescription been filled recently? No  Is the patient out of the medication? Yes  Has the patient been seen for an appointment in the last year OR does the patient have an upcoming appointment? No  Can we respond through MyChart? Yes  Agent: Please be advised that Rx refills may take up to 3 business days. We ask that you follow-up with your pharmacy.

## 2024-01-29 NOTE — Telephone Encounter (Signed)
Last seen by Dr Vassie Loll 08/28/22. FU appt with Dr Marchelle Gearing 4/3.

## 2024-01-29 NOTE — Telephone Encounter (Signed)
Refill request sent to provider. Last seen by Dr Vassie Loll 08/28/22 and has appt with Dr Marchelle Gearing 04/01/24.

## 2024-02-01 ENCOUNTER — Other Ambulatory Visit: Payer: Self-pay | Admitting: Internal Medicine

## 2024-02-02 ENCOUNTER — Ambulatory Visit (HOSPITAL_COMMUNITY)
Admission: RE | Admit: 2024-02-02 | Discharge: 2024-02-02 | Disposition: A | Discharge status: Home / Self Care | Payer: Medicare Other | Source: Ambulatory Visit | Attending: Rheumatology | Admitting: Rheumatology

## 2024-02-02 ENCOUNTER — Other Ambulatory Visit: Payer: Self-pay | Admitting: Pharmacist

## 2024-02-02 ENCOUNTER — Telehealth: Payer: Self-pay | Admitting: Internal Medicine

## 2024-02-02 VITALS — BP 140/76 | HR 69 | Temp 97.8°F | Resp 18 | Wt 220.0 lb

## 2024-02-02 DIAGNOSIS — Z5181 Encounter for therapeutic drug level monitoring: Secondary | ICD-10-CM

## 2024-02-02 DIAGNOSIS — N1831 Chronic kidney disease, stage 3a: Secondary | ICD-10-CM | POA: Insufficient documentation

## 2024-02-02 DIAGNOSIS — Z79899 Other long term (current) drug therapy: Secondary | ICD-10-CM | POA: Insufficient documentation

## 2024-02-02 DIAGNOSIS — Z13220 Encounter for screening for lipoid disorders: Secondary | ICD-10-CM

## 2024-02-02 DIAGNOSIS — M0579 Rheumatoid arthritis with rheumatoid factor of multiple sites without organ or systems involvement: Secondary | ICD-10-CM | POA: Insufficient documentation

## 2024-02-02 DIAGNOSIS — Z8639 Personal history of other endocrine, nutritional and metabolic disease: Secondary | ICD-10-CM

## 2024-02-02 LAB — CBC WITH DIFFERENTIAL/PLATELET
Abs Immature Granulocytes: 0.04 10*3/uL (ref 0.00–0.07)
Basophils Absolute: 0.1 10*3/uL (ref 0.0–0.1)
Basophils Relative: 1 %
Eosinophils Absolute: 0.3 10*3/uL (ref 0.0–0.5)
Eosinophils Relative: 4 %
HCT: 42.2 % (ref 39.0–52.0)
Hemoglobin: 13.7 g/dL (ref 13.0–17.0)
Immature Granulocytes: 1 %
Lymphocytes Relative: 12 %
Lymphs Abs: 1 10*3/uL (ref 0.7–4.0)
MCH: 29.4 pg (ref 26.0–34.0)
MCHC: 32.5 g/dL (ref 30.0–36.0)
MCV: 90.6 fL (ref 80.0–100.0)
Monocytes Absolute: 0.9 10*3/uL (ref 0.1–1.0)
Monocytes Relative: 11 %
Neutro Abs: 5.9 10*3/uL (ref 1.7–7.7)
Neutrophils Relative %: 71 %
Platelets: 223 10*3/uL (ref 150–400)
RBC: 4.66 MIL/uL (ref 4.22–5.81)
RDW: 14.2 % (ref 11.5–15.5)
WBC: 8.2 10*3/uL (ref 4.0–10.5)
nRBC: 0 % (ref 0.0–0.2)

## 2024-02-02 LAB — COMPREHENSIVE METABOLIC PANEL
ALT: 40 U/L (ref 0–44)
AST: 44 U/L — ABNORMAL HIGH (ref 15–41)
Albumin: 3.4 g/dL — ABNORMAL LOW (ref 3.5–5.0)
Alkaline Phosphatase: 79 U/L (ref 38–126)
Anion gap: 12 (ref 5–15)
BUN: 10 mg/dL (ref 8–23)
CO2: 28 mmol/L (ref 22–32)
Calcium: 9.2 mg/dL (ref 8.9–10.3)
Chloride: 102 mmol/L (ref 98–111)
Creatinine, Ser: 1.35 mg/dL — ABNORMAL HIGH (ref 0.61–1.24)
GFR, Estimated: 55 mL/min — ABNORMAL LOW (ref >60)
Glucose, Bld: 117 mg/dL — ABNORMAL HIGH (ref 70–99)
Potassium: 4.1 mmol/L (ref 3.5–5.1)
Sodium: 142 mmol/L (ref 135–145)
Total Bilirubin: 0.6 mg/dL (ref 0.0–1.2)
Total Protein: 6.6 g/dL (ref 6.5–8.1)

## 2024-02-02 MED ORDER — ACETAMINOPHEN 325 MG PO TABS: 650.0000 mg | ORAL_TABLET | ORAL | Status: DC

## 2024-02-02 MED ORDER — DIPHENHYDRAMINE HCL 25 MG PO CAPS: 25.0000 mg | ORAL_CAPSULE | ORAL | Status: DC

## 2024-02-02 MED ORDER — TOCILIZUMAB 400 MG/20ML IV SOLN
400.0000 mg | INTRAVENOUS | Status: DC
  Administered 2024-02-02: 400 mg via INTRAVENOUS
  Filled 2024-02-02: qty 20

## 2024-02-02 NOTE — Telephone Encounter (Signed)
PT came in because we have not responded to the Pharm req for Breo refill. Please refill ASAP. Adv Pt we did not have Breo Samples.    [facility] [place]

## 2024-02-02 NOTE — Progress Notes (Signed)
Next infusion scheduled for Actemra IV (Z6109) on 03/01/2024 and due for updated orders. Diagnosis: RA  Dose: 4mg /kg every 4 weeks  Last Clinic Visit: 01/26/2024 Next Clinic Visit: 04/26/2024  Last infusion: 02/02/2024  Labs: CBC and CMP on 02/02/2024 TB Gold: negative on 02/02/2024   Orders placed for Actemra IV (J3262) x 3 doses along with premedication of acetaminophen and diphenhydramine to be administered 30 minutes before medication infusion.  Standing CBC with diff/platelet and CMP with GFR orders placed to be drawn every 2 months. Lipid panel ordered for upcoming infusion.  Next TB gold due 02/01/2025  [name], PharmD, MPH, BCPS, CPP Clinical Pharmacist (Rheumatology and Pulmonology)

## 2024-02-02 NOTE — Progress Notes (Signed)
CBC is normal.  Creatinine is elevated and stable.  LFTs are mildly elevated.  Patient should avoid NSAIDs and Tylenol use.  Will continue to monitor labs every 3 months.  Please forward results to his PCP.

## 2024-02-02 NOTE — Telephone Encounter (Signed)
Lm x1 for the patient.   He was last see on 08/28/2022 by Dr.  Vassie Loll and was given Trelegy to take the place of Breo.

## 2024-02-03 NOTE — Telephone Encounter (Signed)
 Called and spoke with the pt  He states trelegy did not work for him  He has been using breo and breztri   He states his pharmacist was able to get him breztri  for free  He is not scheduled for appt until April 2025  MR- please advise what inhaler you want him on  Thanks!

## 2024-02-05 LAB — QUANTIFERON-TB GOLD PLUS: QuantiFERON-TB Gold Plus: NEGATIVE

## 2024-02-05 LAB — QUANTIFERON-TB GOLD PLUS (RQFGPL)
QuantiFERON Mitogen Value: 7.61 [IU]/mL
QuantiFERON Nil Value: 0.01 [IU]/mL
QuantiFERON TB1 Ag Value: 0.01 [IU]/mL
QuantiFERON TB2 Ag Value: 0.01 [IU]/mL

## 2024-02-05 NOTE — Telephone Encounter (Signed)
 NFN

## 2024-02-05 NOTE — Telephone Encounter (Signed)
 Patient is completely out of inhaler and would like a call back. He has been 3-4 days without his inhaler.

## 2024-02-05 NOTE — Telephone Encounter (Signed)
 Sign once in 2019 and then saw him again May 2023 [almost 2 years ago].  In 2019 he was on Breo and Incruse.  In 2023 he was on just Breo.  Right now he should take either Breo or Breztri .  Not both.  If he feels Breztri  is working well for him he you can send him a refill on that.  He does need to come in and see me it has been a while.   First available 15 minutes is fine

## 2024-02-05 NOTE — Telephone Encounter (Signed)
 Pt last seen 07/2022 with pending appt 4/3.  Lm for patient.   MR, please see below message and advise. Thanks

## 2024-02-06 ENCOUNTER — Encounter: Payer: Self-pay | Admitting: *Deleted

## 2024-02-06 MED ORDER — BREZTRI AEROSPHERE 160-9-4.8 MCG/ACT IN AERO: 2.0000 | INHALATION_SPRAY | Freq: Every morning | RESPIRATORY_TRACT | 1 refills | Status: AC

## 2024-02-06 NOTE — Telephone Encounter (Signed)
 I called and spoke with pt. Pt is aware of upcoming appointment and MR note. I informed pt I would refill Breztri  enough to last him until his upcoming appointment, but he must keep the appointment for anything further. Pt verbalized understanding. Refill sent. NFN

## 2024-02-06 NOTE — Telephone Encounter (Signed)
 Triage- Please see MR's message for follow up.

## 2024-02-09 ENCOUNTER — Encounter (HOSPITAL_COMMUNITY): Payer: Medicare Other

## 2024-03-01 ENCOUNTER — Ambulatory Visit (HOSPITAL_COMMUNITY)
Admission: RE | Admit: 2024-03-01 | Discharge: 2024-03-01 | Disposition: A | Discharge status: Home / Self Care | Payer: Medicare Other | Source: Ambulatory Visit | Attending: Rheumatology | Admitting: Rheumatology

## 2024-03-01 DIAGNOSIS — M0579 Rheumatoid arthritis with rheumatoid factor of multiple sites without organ or systems involvement: Secondary | ICD-10-CM | POA: Diagnosis not present

## 2024-03-01 DIAGNOSIS — Z5181 Encounter for therapeutic drug level monitoring: Secondary | ICD-10-CM | POA: Diagnosis not present

## 2024-03-01 DIAGNOSIS — Z8639 Personal history of other endocrine, nutritional and metabolic disease: Secondary | ICD-10-CM | POA: Diagnosis not present

## 2024-03-01 DIAGNOSIS — Z13220 Encounter for screening for lipoid disorders: Secondary | ICD-10-CM | POA: Diagnosis not present

## 2024-03-01 DIAGNOSIS — Z79899 Other long term (current) drug therapy: Secondary | ICD-10-CM | POA: Diagnosis not present

## 2024-03-01 LAB — LIPID PANEL
Cholesterol: 155 mg/dL (ref 0–200)
HDL: 78 mg/dL (ref >40)
LDL Cholesterol: 56 mg/dL (ref 0–99)
Total CHOL/HDL Ratio: 2 ratio
Triglycerides: 103 mg/dL (ref <150)
VLDL: 21 mg/dL (ref 0–40)

## 2024-03-01 MED ORDER — ACETAMINOPHEN 325 MG PO TABS: 650.0000 mg | ORAL_TABLET | ORAL | Status: DC

## 2024-03-01 MED ORDER — DIPHENHYDRAMINE HCL 25 MG PO CAPS: 25.0000 mg | ORAL_CAPSULE | ORAL | Status: DC

## 2024-03-01 MED ORDER — TOCILIZUMAB 400 MG/20ML IV SOLN
4.0000 mg/kg | INTRAVENOUS | Status: DC
  Administered 2024-03-01: 400 mg via INTRAVENOUS
  Filled 2024-03-01: qty 20

## 2024-03-01 NOTE — Progress Notes (Signed)
Lipid panel WNL

## 2024-03-08 ENCOUNTER — Other Ambulatory Visit: Payer: Medicare Other

## 2024-03-08 DIAGNOSIS — C61 Malignant neoplasm of prostate: Secondary | ICD-10-CM

## 2024-03-09 LAB — PSA: Prostate Specific Ag, Serum: <0.1 ng/mL (ref 0.0–4.0)

## 2024-03-14 ENCOUNTER — Other Ambulatory Visit: Payer: Self-pay | Admitting: Rheumatology

## 2024-03-15 ENCOUNTER — Ambulatory Visit: Payer: Medicare Other | Admitting: Urology

## 2024-03-15 NOTE — Telephone Encounter (Signed)
 Last Fill: 12/10/2023  Labs: 02/02/2024 CBC is normal.  Creatinine is elevated and stable.  LFTs are mildly elevated.  Patient should avoid NSAIDs and Tylenol use.  Will continue to monitor labs every 3 months.  Please forward results to his PCP.   Next Visit: 04/26/2024  Last Visit: 01/26/2024   DX: Rheumatoid arthritis involving multiple sites with positive rheumatoid factor   Current Dose per office note 01/26/2024 : Arava 20 mg 1 tablet by mouth daily.   Okay to refill Arava ?

## 2024-03-17 ENCOUNTER — Ambulatory Visit: Payer: Medicare Other | Admitting: Urology

## 2024-03-29 ENCOUNTER — Encounter (HOSPITAL_COMMUNITY)
Admission: RE | Admit: 2024-03-29 | Discharge: 2024-03-29 | Disposition: A | Discharge status: Home / Self Care | Payer: Medicare Other | Source: Ambulatory Visit | Attending: Rheumatology | Admitting: Rheumatology

## 2024-03-29 DIAGNOSIS — Z5181 Encounter for therapeutic drug level monitoring: Secondary | ICD-10-CM | POA: Insufficient documentation

## 2024-03-29 DIAGNOSIS — Z79899 Other long term (current) drug therapy: Secondary | ICD-10-CM | POA: Diagnosis not present

## 2024-03-29 DIAGNOSIS — M0579 Rheumatoid arthritis with rheumatoid factor of multiple sites without organ or systems involvement: Secondary | ICD-10-CM | POA: Insufficient documentation

## 2024-03-29 MED ORDER — ACETAMINOPHEN 325 MG PO TABS: 650.0000 mg | ORAL_TABLET | ORAL | Status: DC

## 2024-03-29 MED ORDER — DIPHENHYDRAMINE HCL 25 MG PO CAPS: 25.0000 mg | ORAL_CAPSULE | ORAL | Status: DC

## 2024-03-29 MED ORDER — SODIUM CHLORIDE 0.9 % IV SOLN
4.0000 mg/kg | INTRAVENOUS | Status: DC
  Administered 2024-03-29: 400 mg via INTRAVENOUS
  Filled 2024-03-29: qty 20

## 2024-03-31 DIAGNOSIS — H1010 Acute atopic conjunctivitis, unspecified eye: Secondary | ICD-10-CM | POA: Diagnosis not present

## 2024-03-31 DIAGNOSIS — Z6834 Body mass index (BMI) 34.0-34.9, adult: Secondary | ICD-10-CM | POA: Diagnosis not present

## 2024-03-31 DIAGNOSIS — E6609 Other obesity due to excess calories: Secondary | ICD-10-CM | POA: Diagnosis not present

## 2024-03-31 DIAGNOSIS — Z7901 Long term (current) use of anticoagulants: Secondary | ICD-10-CM | POA: Diagnosis not present

## 2024-03-31 DIAGNOSIS — J302 Other seasonal allergic rhinitis: Secondary | ICD-10-CM | POA: Diagnosis not present

## 2024-04-01 ENCOUNTER — Ambulatory Visit (HOSPITAL_BASED_OUTPATIENT_CLINIC_OR_DEPARTMENT_OTHER): Payer: Medicare Other | Admitting: Pulmonary Disease

## 2024-04-01 ENCOUNTER — Encounter (HOSPITAL_BASED_OUTPATIENT_CLINIC_OR_DEPARTMENT_OTHER): Payer: Self-pay | Admitting: Pulmonary Disease

## 2024-04-01 ENCOUNTER — Ambulatory Visit: Payer: Medicare Other | Admitting: Internal Medicine

## 2024-04-01 VITALS — BP 122/80 | HR 79 | Ht 69.0 in | Wt 230.2 lb

## 2024-04-01 DIAGNOSIS — G4733 Obstructive sleep apnea (adult) (pediatric): Secondary | ICD-10-CM

## 2024-04-01 DIAGNOSIS — J986 Disorders of diaphragm: Secondary | ICD-10-CM

## 2024-04-01 DIAGNOSIS — Z8709 Personal history of other diseases of the respiratory system: Secondary | ICD-10-CM | POA: Diagnosis not present

## 2024-04-01 NOTE — Patient Instructions (Signed)
 x BiPAP supplies will be renewed for a year   Breztri twice daily.  If nipple soreness increases, discussed with cardiologist about stopping spironolactone

## 2024-04-01 NOTE — Progress Notes (Signed)
   Subjective:    Patient ID: [name], male    DOB: 04/19/1951, 73 y.o.   MRN: [id number]  HPI 73 yo remote smoker for FU of COPD and severe obstructive sleep apnea & LT diaphragm weakness -On BiPAP due to persistent events on CPAP titration    PMH: GERD,  chronic anticoagulation for DVT,  rheumatoid arthritis (on Actemra),CHF,  prostate cancer,  Serratia sepsis (June/2020) Covid pneumonia 2020 penile implant  In 2019 he was on Breo and Incruse. In 2023 he was on just [place].   2 year FU visit  The patient, with a history of COPD and sleep apnea managed with BiPAP, presents with soreness in the nipples. He is currently on aldactone, which may be contributing to this symptom. Despite the diagnosis of COPD, the patient is not convinced he has this condition. He also has left hemidiaphragm weakness.  BiPAP download was reviewed which shows reasonable control of events and average AHI residual of 6/hour, symptomatically after 10/hour, average BiPAP 10/13 cm, mild leak.  He has excellent compliance more than 6.5 hours per night  He inquires about inspire device  Significant tests/ events reviewed  HRCT 05/2022 >> Parke Simmers appearing dependent bibasilar scarring or atelectasis, more in the left lung, with elevation of the left hemidiaphragm, unchanged    CT chest 11/2018   Showed elevated Lt hemi-diaphragm  emphysema, mild bronchiectasis and peripheral subpleural reticulation within right lung base which appears slightlyprogressive.    PFTs 07/2022 -no obstruction, FVC 49%, TLC 62%, DLCO 15.5/63%   PFTs 02/08/2019 - FVC 2.24 (51%), FEV1 1.90 (59%), ratio 85, DLCOunc 71   PFTs 11/26/2017 - FVC 2.11 (48%), FEV1 1.77 (54%), ratio 84, DLCOcor 63   04/26/2019-Home sleep study- AHI 38.5 an hour, SaO2 low 68%, severe O2 drops, average O2 89%   06/03/2019-CPAP titration- optimal Pap pressure could not be selected, recommendations: Trial of auto BiPAP 6 to 18 cm with medium full  facemask  Review of Systems neg for any significant sore throat, dysphagia, itching, sneezing, nasal congestion or excess/ purulent secretions, fever, chills, sweats, unintended wt loss, pleuritic or exertional cp, hempoptysis, orthopnea pnd or change in chronic leg swelling. Also denies presyncope, palpitations, heartburn, abdominal pain, nausea, vomiting, diarrhea or change in bowel or urinary habits, dysuria,hematuria, rash, arthralgias, visual complaints, headache, numbness weakness or ataxia.     Objective:   Physical Exam  Gen. Pleasant, obese, in no distress ENT - no lesions, no post nasal drip Neck: No JVD, no thyromegaly, no carotid bruits Lungs: no use of accessory muscles, no dullness to percussion, decreased without rales or rhonchi  Cardiovascular: Rhythm regular, heart sounds  normal, no murmurs or gallops, no peripheral edema Musculoskeletal: No deformities, no cyanosis or clubbing , no tremors       Assessment & Plan:   Diaphragm weakness and OSA He has diaphragm weakness and sleep apnea, both managed effectively with BiPAP therapy. Although he inquired about the [facility] device, it was explained that BiPAP is beneficial for both conditions, and continuation is recommended.  Chronic Obstructive Pulmonary Disease (COPD) COPD diagnosis is uncertain. A chest x-ray from December 2021 shows improved lung aeration and resolution of streaky atelectasis, suggesting less severity than previously thought. Continue Breztri  Gynecomastia He reports nipple soreness, potentially related to spironolactone, which is known to cause gynecomastia as a side effect.

## 2024-04-12 NOTE — Progress Notes (Deleted)
 Office Visit Note  Patient: [name]             Date of Birth: 1951-07-10           MRN: [id number]             PCP: Minus Amel, MD Referring: Minus Amel, MD Visit Date: 04/26/2024 Occupation: @GUAROCC @  Subjective:    History of Present Illness: [name] is a 73 y.o. male with history of seropositive rheumatoid arthritis and osteoarthritis.  Patient remains on  Actemra  4mg /kg IV infusions every 4 weeks and Arava  20 mg 1 tablet by mouth daily.    CBC and CMP updated on 02/02/24. TB gold negative on 02/02/24.  Lipid panel updated on 03/01/24.    Activities of Daily Living:  Patient reports morning stiffness for *** {minute/hour:19697}.   Patient {ACTIONS;DENIES/REPORTS:21021675::"Denies"} nocturnal pain.  Difficulty dressing/grooming: {ACTIONS;DENIES/REPORTS:21021675::"Denies"} Difficulty climbing stairs: {ACTIONS;DENIES/REPORTS:21021675::"Denies"} Difficulty getting out of chair: {ACTIONS;DENIES/REPORTS:21021675::"Denies"} Difficulty using hands for taps, buttons, cutlery, and/or writing: {ACTIONS;DENIES/REPORTS:21021675::"Denies"}  No Rheumatology ROS completed.   PMFS History:  Patient Active Problem List   Diagnosis Date Noted   Rectal bleeding 08/27/2023   Acute respiratory failure with hypoxia (HCC) 01/23/2023   Diaphragm paralysis 08/28/2022   Chest pain 03/11/2022   Stage 3a chronic kidney disease (CKD) (HCC) 03/11/2022   Bronchiectasis without complication (HCC) 03/26/2021   Incontinence when straining, male 03/20/2021   Erectile dysfunction after radical prostatectomy 09/08/2020   OAB (overactive bladder) 02/23/2020   Class 2 severe obesity due to excess calories with serious comorbidity and body mass index (BMI) of 35.0 to 35.9 in adult [facility])    Chronic diastolic HF (heart failure) (HCC)    Serratia sepsis (HCC) 06/08/2019   Voice hoarseness 04/26/2019   OSA (obstructive sleep apnea) 03/10/2019   Abnormal CT of the chest 02/09/2019    Dyspnea on exertion 10/22/2017   History of seizure disorder 02/27/2017   Primary osteoarthritis of both hands 02/27/2017   Primary osteoarthritis of both feet 02/27/2017   Idiopathic gout of multiple sites 02/27/2017   High risk medication use 12/29/2016   History of prostate cancer 12/29/2016   Primary osteoarthritis of both knees 12/29/2016   Chronic idiopathic gout involving toe without tophus 12/29/2016   History of CHF (congestive heart failure) 12/29/2016   History of COPD 12/29/2016   Plantar pustular psoriasis 12/29/2016   DVT (deep venous thrombosis) (HCC) 10/31/2016   COPD with acute exacerbation (HCC) 10/31/2016   CHF (congestive heart failure) (HCC) 10/31/2016   Prostate cancer (HCC) 08/30/2015   Malignant neoplasm of prostate (HCC) 07/04/2015   Chest pain at rest 07/05/2014   Weakness 07/05/2014   Complicated postphlebitic syndrome 11/16/2013   Personal history of DVT (deep vein thrombosis) 05/18/2013   Chronic anticoagulation 05/18/2013   Diverticulosis of colon without hemorrhage 05/18/2013   Rheumatoid arthritis (HCC) 05/18/2013   Seizure disorder (HCC) 05/18/2013   History of colonic polyps 05/18/2013   GERD 05/08/2010   NAUSEA 05/08/2010   FLATULENCE-GAS-BLOATING 05/08/2010    Past Medical History:  Diagnosis Date   Arthritis    RA   Asthma    CHF (congestive heart failure) (HCC)    pt denies    Clotting disorder (HCC)    DVT both legs    COPD (chronic obstructive pulmonary disease) (HCC)    DDD (degenerative disc disease), cervical    with UE's paresthesias   Diverticulosis    DVT, lower extremity (HCC)    bilat   Eye abnormality  right eye drifts has difficulty focusing with 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 Unknown origin.   Shortness of breath dyspnea    exertion    Sleep apnea    cpap    Tubular adenoma of colon 04/2008    Family History  Problem Relation Age of Onset   Alzheimer's disease Mother    Skin cancer Father    Stomach cancer Father    Heart attack Father    Breast cancer Sister    Heart attack Sister    Stroke Sister    Bladder Cancer Sister    Heart murmur Sister    Esophageal cancer Brother    Throat cancer Brother    Stomach cancer Brother    Lung cancer Brother    Colon cancer Brother    Heart attack Brother    Heart attack Brother    Colon cancer Brother    Colon polyps Neg Hx    Rectal cancer Neg Hx    Past Surgical History:  Procedure Laterality Date   CHOLECYSTECTOMY     CIRCUMCISION     COLONOSCOPY     CYSTOSCOPY WITH LITHOLAPAXY N/A 03/17/2019   Procedure: CYSTOSCOPY WITH LITHOLAPAXY AND REMOVAL OF FOREIGN BODY;  Surgeon: Marco Severs, MD;  Location: [facility];  Service: Urology;  Laterality: N/A;   DOPPLER ECHOCARDIOGRAPHY N/A 01-21-2012   TECHNICALLY DIFFICULT. MILD CONCENTRIC LV HYPERTROPHY. LV CAVITY IS SMALL.. EF=> 55%. TRANSMITRAL SPECTRAL FLOW PATTREN IS SUGGESTIVE OF IMPAIRED LV RELAXATION. RV SYSTOLIC PRESSURE IS . LEFT ATRIAL SIZE IS NORMAL. AV APPEARS MILDLY SCLEROTIC. NO SIGN VALVE DISEASE NOTED.   LEFT HEART CATH AND CORONARY ANGIOGRAPHY N/A 03/12/2022   Procedure: LEFT HEART CATH AND CORONARY ANGIOGRAPHY;  Surgeon: Arnoldo Lapping, MD;  Location: [facility] INVASIVE CV LAB;  Service: Cardiovascular;  Laterality: N/A;   LYMPHADENECTOMY Bilateral 08/30/2015   Procedure: PELVIC LYMPHADENECTOMY;  Surgeon: Marco Severs, MD;  Location: [facility];  Service: Urology;  Laterality: Bilateral;   NUCLEAR STRESS TEST N/A 01-21-2012   NORMAL PATTERN OF PERFUSION IN ALL REGIONS. POST STRESS LV SIZE IS NORMAL. NO EVIDENCE OF INDUCIBLE  ISCHEMIA. EF 54%.   POLYPECTOMY     PROSTATE BIOPSY     ROBOT ASSISTED LAPAROSCOPIC RADICAL PROSTATECTOMY N/A 08/30/2015   Procedure: ROBOTIC ASSISTED LAPAROSCOPIC RADICAL PROSTATECTOMY;  Surgeon: Marco Severs, MD;  Location: [facility];  Service: Urology;  Laterality: N/A;   URINARY SPHINCTER IMPLANT N/A 03/20/2021   Procedure: ARTIFICIAL URINARY SPHINCTER CYSTOSCOPY;  Surgeon: Erman Hayward, MD;  Location: [facility];  Service: Urology;  Laterality: N/A;  REQUESTING 90 MINS   US  VENOUS LOWER EXT Right 03/07/11   PERSISTENT DVT IN RIGHT LOWER EXT.WITH PERSISTANT VISUALIZATION OF HYPOECHOIC THROMBUS WITHIN THE FEMORAL, PROFUNDA FEMORAL AND POPLITEAL VEINS. WHEN COMPARED TO PREVIOUS, CLOT IS NO LONGER IDENTIFIED WITH IN THE RIGHT CFV.   Social History   Social History Narrative   Not on file   Immunization History  Administered Date(s) Administered   Fluad Quad(high Dose 65+) 09/30/2019  Influenza, High Dose Seasonal PF 10/05/2018, 10/10/2021   Influenza,inj,Quad PF,6+ Mos 08/31/2015   Influenza-Unspecified 09/30/2016, 08/22/2017   Moderna Sars-Covid-2 Vaccination 01/21/2020, 02/18/2020, 10/25/2020, 10/10/2021   Pneumococcal Conjugate-13 10/05/2018   Pneumococcal-Unspecified 12/30/2012   Td 07/05/1996     Objective: Vital Signs: There were no vitals taken for this visit.   Physical Exam Vitals and nursing note reviewed.  Constitutional:      Appearance: He is well-developed.  HENT:     Head: Normocephalic and atraumatic.  Eyes:     Conjunctiva/sclera: Conjunctivae normal.     Pupils: Pupils are equal, round, and reactive to light.  Cardiovascular:     Rate and Rhythm: Normal rate and regular rhythm.     Heart sounds: Normal heart sounds.  Pulmonary:     Effort: Pulmonary effort is normal.     Breath sounds: Normal breath sounds.  Abdominal:     General: Bowel sounds are normal.     Palpations: Abdomen is soft.  Musculoskeletal:     Cervical back: Normal range of motion  and neck supple.  Skin:    General: Skin is warm and dry.     Capillary Refill: Capillary refill takes less than 2 seconds.  Neurological:     Mental Status: He is alert and oriented to person, place, and time.  Psychiatric:        Behavior: Behavior normal.      Musculoskeletal Exam: ***  CDAI Exam: CDAI Score: -- Patient Global: --; Provider Global: -- Swollen: --; Tender: -- Joint Exam 04/26/2024   No joint exam has been documented for this visit   There is currently no information documented on the homunculus. Go to the Rheumatology activity and complete the homunculus joint exam.  Investigation: No additional findings.  Imaging: No results found.  Recent Labs: Lab Results  Component Value Date   WBC 8.2 02/02/2024   HGB 13.7 02/02/2024   PLT 223 02/02/2024   NA 142 02/02/2024   K 4.1 02/02/2024   CL 102 02/02/2024   CO2 28 02/02/2024   GLUCOSE 117 (H) 02/02/2024   BUN 10 02/02/2024   CREATININE 1.35 (H) 02/02/2024   BILITOT 0.6 02/02/2024   ALKPHOS 79 02/02/2024   AST 44 (H) 02/02/2024   ALT 40 02/02/2024   PROT 6.6 02/02/2024   ALBUMIN 3.4 (L) 02/02/2024   CALCIUM  9.2 02/02/2024   GFRAA >60 09/20/2020   QFTBGOLDPLUS Negative 02/02/2024    Speciality Comments: ACTEMRA  4mg /kg x 4 weeks PPD negative 01/02/17  Procedures:  No procedures performed Allergies: Orencia  [abatacept ], Carbamazepine, Celecoxib, Cephalexin, Enbrel [etanercept], Humira [adalimumab], Levofloxacin , Sulfa antibiotics, and Sulfasalazine   Assessment / Plan:     Visit Diagnoses: Rheumatoid arthritis involving multiple sites with positive rheumatoid factor (HCC)  High risk medication use  History of hyperlipidemia  Medication monitoring encounter  Age-related osteoporosis without current pathological fracture  Idiopathic chronic gout of multiple sites without tophus  Primary osteoarthritis of both hands  Primary osteoarthritis of both knees  Primary osteoarthritis of both  feet  History of abscessed tooth  History of COPD  History of adenomatous polyp of colon  History of seizure disorder  History of CHF (congestive heart failure)  History of prostate cancer  History of DVT (deep vein thrombosis)  Orders: No orders of the defined types were placed in this encounter.  No orders of the defined types were placed in this encounter.   Face-to-face time spent with patient was *** minutes. Greater than 50% of time  was spent in counseling and coordination of care.  Follow-Up Instructions: No follow-ups on file.   Romayne Clubs, PA-C  Note - This record has been created using Dragon software.  Chart creation errors have been sought, but may not always  have been located. Such creation errors do not reflect on  the standard of medical care.

## 2024-04-26 ENCOUNTER — Inpatient Hospital Stay (HOSPITAL_COMMUNITY): Admission: RE | Admit: 2024-04-26 | Source: Ambulatory Visit

## 2024-04-26 ENCOUNTER — Ambulatory Visit: Payer: Medicare Other | Admitting: Physician Assistant

## 2024-04-26 DIAGNOSIS — Z5181 Encounter for therapeutic drug level monitoring: Secondary | ICD-10-CM

## 2024-04-26 DIAGNOSIS — M170 Bilateral primary osteoarthritis of knee: Secondary | ICD-10-CM

## 2024-04-26 DIAGNOSIS — M0579 Rheumatoid arthritis with rheumatoid factor of multiple sites without organ or systems involvement: Secondary | ICD-10-CM

## 2024-04-26 DIAGNOSIS — Z8679 Personal history of other diseases of the circulatory system: Secondary | ICD-10-CM

## 2024-04-26 DIAGNOSIS — Z8669 Personal history of other diseases of the nervous system and sense organs: Secondary | ICD-10-CM

## 2024-04-26 DIAGNOSIS — M810 Age-related osteoporosis without current pathological fracture: Secondary | ICD-10-CM

## 2024-04-26 DIAGNOSIS — Z8546 Personal history of malignant neoplasm of prostate: Secondary | ICD-10-CM

## 2024-04-26 DIAGNOSIS — Z86718 Personal history of other venous thrombosis and embolism: Secondary | ICD-10-CM

## 2024-04-26 DIAGNOSIS — Z8639 Personal history of other endocrine, nutritional and metabolic disease: Secondary | ICD-10-CM

## 2024-04-26 DIAGNOSIS — Z8719 Personal history of other diseases of the digestive system: Secondary | ICD-10-CM

## 2024-04-26 DIAGNOSIS — Z79899 Other long term (current) drug therapy: Secondary | ICD-10-CM

## 2024-04-26 DIAGNOSIS — M19041 Primary osteoarthritis, right hand: Secondary | ICD-10-CM

## 2024-04-26 DIAGNOSIS — Z860101 Personal history of adenomatous and serrated colon polyps: Secondary | ICD-10-CM

## 2024-04-26 DIAGNOSIS — M1A09X0 Idiopathic chronic gout, multiple sites, without tophus (tophi): Secondary | ICD-10-CM

## 2024-04-26 DIAGNOSIS — Z8709 Personal history of other diseases of the respiratory system: Secondary | ICD-10-CM

## 2024-04-26 DIAGNOSIS — M19071 Primary osteoarthritis, right ankle and foot: Secondary | ICD-10-CM

## 2024-04-27 ENCOUNTER — Other Ambulatory Visit: Payer: Self-pay | Admitting: Pharmacist

## 2024-04-27 ENCOUNTER — Ambulatory Visit (HOSPITAL_COMMUNITY)
Admission: RE | Admit: 2024-04-27 | Discharge: 2024-04-27 | Disposition: A | Discharge status: Home / Self Care | Source: Ambulatory Visit | Attending: Rheumatology | Admitting: Rheumatology

## 2024-04-27 DIAGNOSIS — Z5181 Encounter for therapeutic drug level monitoring: Secondary | ICD-10-CM | POA: Insufficient documentation

## 2024-04-27 DIAGNOSIS — Z79899 Other long term (current) drug therapy: Secondary | ICD-10-CM | POA: Diagnosis not present

## 2024-04-27 DIAGNOSIS — M0579 Rheumatoid arthritis with rheumatoid factor of multiple sites without organ or systems involvement: Secondary | ICD-10-CM | POA: Insufficient documentation

## 2024-04-27 LAB — COMPREHENSIVE METABOLIC PANEL WITH GFR
ALT: 24 U/L (ref 0–44)
AST: 29 U/L (ref 15–41)
Albumin: 3.8 g/dL (ref 3.5–5.0)
Alkaline Phosphatase: 52 U/L (ref 38–126)
Anion gap: 9 (ref 5–15)
BUN: 18 mg/dL (ref 8–23)
CO2: 30 mmol/L (ref 22–32)
Calcium: 9.1 mg/dL (ref 8.9–10.3)
Chloride: 102 mmol/L (ref 98–111)
Creatinine, Ser: 1.52 mg/dL — ABNORMAL HIGH (ref 0.61–1.24)
GFR, Estimated: 48 mL/min — ABNORMAL LOW (ref >60)
Glucose, Bld: 129 mg/dL — ABNORMAL HIGH (ref 70–99)
Potassium: 4.2 mmol/L (ref 3.5–5.1)
Sodium: 141 mmol/L (ref 135–145)
Total Bilirubin: 0.6 mg/dL (ref 0.0–1.2)
Total Protein: 6.5 g/dL (ref 6.5–8.1)

## 2024-04-27 LAB — CBC WITH DIFFERENTIAL/PLATELET
Abs Immature Granulocytes: 0.02 10*3/uL (ref 0.00–0.07)
Basophils Absolute: 0.1 10*3/uL (ref 0.0–0.1)
Basophils Relative: 1 %
Eosinophils Absolute: 0.6 10*3/uL — ABNORMAL HIGH (ref 0.0–0.5)
Eosinophils Relative: 9 %
HCT: 42.3 % (ref 39.0–52.0)
Hemoglobin: 13.8 g/dL (ref 13.0–17.0)
Immature Granulocytes: 0 %
Lymphocytes Relative: 19 %
Lymphs Abs: 1.2 10*3/uL (ref 0.7–4.0)
MCH: 28.7 pg (ref 26.0–34.0)
MCHC: 32.6 g/dL (ref 30.0–36.0)
MCV: 87.9 fL (ref 80.0–100.0)
Monocytes Absolute: 0.9 10*3/uL (ref 0.1–1.0)
Monocytes Relative: 15 %
Neutro Abs: 3.4 10*3/uL (ref 1.7–7.7)
Neutrophils Relative %: 56 %
Platelets: 150 10*3/uL (ref 150–400)
RBC: 4.81 MIL/uL (ref 4.22–5.81)
RDW: 15.4 % (ref 11.5–15.5)
WBC: 6.1 10*3/uL (ref 4.0–10.5)
nRBC: 0 % (ref 0.0–0.2)

## 2024-04-27 MED ORDER — ACETAMINOPHEN 325 MG PO TABS: 650.0000 mg | ORAL_TABLET | ORAL | Status: DC

## 2024-04-27 MED ORDER — SODIUM CHLORIDE 0.9 % IV SOLN
4.0000 mg/kg | INTRAVENOUS | Status: DC
  Administered 2024-04-27: 400 mg via INTRAVENOUS
  Filled 2024-04-27: qty 20

## 2024-04-27 MED ORDER — DIPHENHYDRAMINE HCL 25 MG PO CAPS: 25.0000 mg | ORAL_CAPSULE | ORAL | Status: DC

## 2024-04-27 NOTE — Progress Notes (Signed)
 Creatinine remains elevated-1.52 and GFR is low-48. Please advise the patient to avoid the use of NSAIDs and forward results to nephrology.   CBC stable-absolute eosinophils are elevated likely related to allergies.

## 2024-04-27 NOTE — Progress Notes (Signed)
 Next infusion scheduled for Actemra  IV (E9528) on 05/25/2024 and due for updated orders. Diagnosis: RA  Dose: 4mg /kg every 4 weeks  Last Clinic Visit: 01/26/2024 Next Clinic Visit: not scheduled and due (staff message sent to scheduling team)  Last infusion: 04/27/2024  Labs: CBC and CMP on 04/27/2024 TB Gold: negative on 02/02/2024   Orders placed for Actemra  IV (J3262) x 3 doses along with premedication of acetaminophen  and diphenhydramine  to be administered 30 minutes before medication infusion.  Standing CBC with diff/platelet and CMP with GFR orders placed to be drawn every 2 months.  Next TB gold due February 2026  Geraldene Kleine, PharmD, MPH, BCPS, CPP Clinical Pharmacist (Rheumatology and Pulmonology)

## 2024-04-29 DIAGNOSIS — Z7901 Long term (current) use of anticoagulants: Secondary | ICD-10-CM | POA: Diagnosis not present

## 2024-05-03 ENCOUNTER — Other Ambulatory Visit: Payer: Self-pay | Admitting: Cardiovascular Disease

## 2024-05-03 ENCOUNTER — Other Ambulatory Visit: Payer: Self-pay | Admitting: Physician Assistant

## 2024-05-10 ENCOUNTER — Other Ambulatory Visit

## 2024-05-10 ENCOUNTER — Other Ambulatory Visit: Payer: Self-pay

## 2024-05-10 DIAGNOSIS — C61 Malignant neoplasm of prostate: Secondary | ICD-10-CM

## 2024-05-11 LAB — PSA: Prostate Specific Ag, Serum: <0.1 ng/mL (ref 0.0–4.0)

## 2024-05-14 ENCOUNTER — Ambulatory Visit: Admitting: Urology

## 2024-05-17 DIAGNOSIS — B353 Tinea pedis: Secondary | ICD-10-CM | POA: Diagnosis not present

## 2024-05-17 DIAGNOSIS — R7309 Other abnormal glucose: Secondary | ICD-10-CM | POA: Diagnosis not present

## 2024-05-17 DIAGNOSIS — E6609 Other obesity due to excess calories: Secondary | ICD-10-CM | POA: Diagnosis not present

## 2024-05-17 DIAGNOSIS — E559 Vitamin D deficiency, unspecified: Secondary | ICD-10-CM | POA: Diagnosis not present

## 2024-05-17 DIAGNOSIS — Z7901 Long term (current) use of anticoagulants: Secondary | ICD-10-CM | POA: Diagnosis not present

## 2024-05-17 DIAGNOSIS — K5900 Constipation, unspecified: Secondary | ICD-10-CM | POA: Diagnosis not present

## 2024-05-17 DIAGNOSIS — D6869 Other thrombophilia: Secondary | ICD-10-CM | POA: Diagnosis not present

## 2024-05-17 DIAGNOSIS — R5383 Other fatigue: Secondary | ICD-10-CM | POA: Diagnosis not present

## 2024-05-17 DIAGNOSIS — M069 Rheumatoid arthritis, unspecified: Secondary | ICD-10-CM | POA: Diagnosis not present

## 2024-05-17 DIAGNOSIS — Z6834 Body mass index (BMI) 34.0-34.9, adult: Secondary | ICD-10-CM | POA: Diagnosis not present

## 2024-05-25 ENCOUNTER — Ambulatory Visit (HOSPITAL_COMMUNITY)
Admission: RE | Admit: 2024-05-25 | Discharge: 2024-05-25 | Disposition: A | Discharge status: Home / Self Care | Source: Ambulatory Visit | Attending: Rheumatology | Admitting: Rheumatology

## 2024-05-25 DIAGNOSIS — M0579 Rheumatoid arthritis with rheumatoid factor of multiple sites without organ or systems involvement: Secondary | ICD-10-CM | POA: Insufficient documentation

## 2024-05-25 DIAGNOSIS — Z79899 Other long term (current) drug therapy: Secondary | ICD-10-CM | POA: Insufficient documentation

## 2024-05-25 MED ORDER — ACETAMINOPHEN 325 MG PO TABS: 650.0000 mg | ORAL_TABLET | ORAL | Status: DC

## 2024-05-25 MED ORDER — DIPHENHYDRAMINE HCL 25 MG PO CAPS: 25.0000 mg | ORAL_CAPSULE | ORAL | Status: DC

## 2024-05-25 MED ORDER — SODIUM CHLORIDE 0.9 % IV SOLN
4.0000 mg/kg | INTRAVENOUS | Status: DC
  Administered 2024-05-25: 400 mg via INTRAVENOUS
  Filled 2024-05-25: qty 20

## 2024-05-29 DIAGNOSIS — I110 Hypertensive heart disease with heart failure: Secondary | ICD-10-CM | POA: Diagnosis not present

## 2024-05-29 DIAGNOSIS — J449 Chronic obstructive pulmonary disease, unspecified: Secondary | ICD-10-CM | POA: Diagnosis not present

## 2024-06-02 DIAGNOSIS — J069 Acute upper respiratory infection, unspecified: Secondary | ICD-10-CM | POA: Diagnosis not present

## 2024-06-02 DIAGNOSIS — Z20828 Contact with and (suspected) exposure to other viral communicable diseases: Secondary | ICD-10-CM | POA: Diagnosis not present

## 2024-06-02 DIAGNOSIS — E6609 Other obesity due to excess calories: Secondary | ICD-10-CM | POA: Diagnosis not present

## 2024-06-02 DIAGNOSIS — Z6833 Body mass index (BMI) 33.0-33.9, adult: Secondary | ICD-10-CM | POA: Diagnosis not present

## 2024-06-02 DIAGNOSIS — R6889 Other general symptoms and signs: Secondary | ICD-10-CM | POA: Diagnosis not present

## 2024-06-22 ENCOUNTER — Ambulatory Visit: Payer: Self-pay | Admitting: Physician Assistant

## 2024-06-22 ENCOUNTER — Ambulatory Visit (HOSPITAL_COMMUNITY)
Admission: RE | Admit: 2024-06-22 | Discharge: 2024-06-22 | Disposition: A | Discharge status: Home / Self Care | Source: Ambulatory Visit | Attending: Rheumatology | Admitting: Rheumatology

## 2024-06-22 DIAGNOSIS — M0579 Rheumatoid arthritis with rheumatoid factor of multiple sites without organ or systems involvement: Secondary | ICD-10-CM | POA: Diagnosis not present

## 2024-06-22 DIAGNOSIS — Z79899 Other long term (current) drug therapy: Secondary | ICD-10-CM | POA: Insufficient documentation

## 2024-06-22 LAB — CBC WITH DIFFERENTIAL/PLATELET
Abs Immature Granulocytes: 0.02 10*3/uL (ref 0.00–0.07)
Basophils Absolute: 0.1 10*3/uL (ref 0.0–0.1)
Basophils Relative: 1 %
Eosinophils Absolute: 0.5 10*3/uL (ref 0.0–0.5)
Eosinophils Relative: 8 %
HCT: 44.3 % (ref 39.0–52.0)
Hemoglobin: 14.1 g/dL (ref 13.0–17.0)
Immature Granulocytes: 0 %
Lymphocytes Relative: 17 %
Lymphs Abs: 1.1 10*3/uL (ref 0.7–4.0)
MCH: 28.2 pg (ref 26.0–34.0)
MCHC: 31.8 g/dL (ref 30.0–36.0)
MCV: 88.6 fL (ref 80.0–100.0)
Monocytes Absolute: 1 10*3/uL (ref 0.1–1.0)
Monocytes Relative: 16 %
Neutro Abs: 3.5 10*3/uL (ref 1.7–7.7)
Neutrophils Relative %: 58 %
Platelets: 152 10*3/uL (ref 150–400)
RBC: 5 MIL/uL (ref 4.22–5.81)
RDW: 15.8 % — ABNORMAL HIGH (ref 11.5–15.5)
WBC: 6.1 10*3/uL (ref 4.0–10.5)
nRBC: 0 % (ref 0.0–0.2)

## 2024-06-22 LAB — COMPREHENSIVE METABOLIC PANEL WITH GFR
ALT: 28 U/L (ref 0–44)
AST: 36 U/L (ref 15–41)
Albumin: 3.9 g/dL (ref 3.5–5.0)
Alkaline Phosphatase: 48 U/L (ref 38–126)
Anion gap: 14 (ref 5–15)
BUN: 15 mg/dL (ref 8–23)
CO2: 28 mmol/L (ref 22–32)
Calcium: 9.2 mg/dL (ref 8.9–10.3)
Chloride: 102 mmol/L (ref 98–111)
Creatinine, Ser: 1.41 mg/dL — ABNORMAL HIGH (ref 0.61–1.24)
GFR, Estimated: 53 mL/min — ABNORMAL LOW (ref >60)
Glucose, Bld: 129 mg/dL — ABNORMAL HIGH (ref 70–99)
Potassium: 3.7 mmol/L (ref 3.5–5.1)
Sodium: 144 mmol/L (ref 135–145)
Total Bilirubin: 0.7 mg/dL (ref 0.0–1.2)
Total Protein: 6.4 g/dL — ABNORMAL LOW (ref 6.5–8.1)

## 2024-06-22 MED ORDER — ACETAMINOPHEN 325 MG PO TABS: 650.0000 mg | ORAL_TABLET | ORAL | Status: DC

## 2024-06-22 MED ORDER — TOCILIZUMAB 400 MG/20ML IV SOLN
4.0000 mg/kg | INTRAVENOUS | Status: DC
  Administered 2024-06-22: 400 mg via INTRAVENOUS
  Filled 2024-06-22: qty 20

## 2024-06-22 MED ORDER — DIPHENHYDRAMINE HCL 25 MG PO CAPS: 25.0000 mg | ORAL_CAPSULE | ORAL | Status: DC

## 2024-06-22 NOTE — Progress Notes (Signed)
 Glucose is 129.  Creatinine remains elevated-1.41 and GFR low-53. Avoid the use of NSAIDs.  Forward labs to nephrology.   Total protein is borderline low.  Rest of CMP WNL.

## 2024-06-22 NOTE — Progress Notes (Signed)
Please forward results to PCP

## 2024-06-22 NOTE — Progress Notes (Signed)
 CBC WNL

## 2024-07-19 ENCOUNTER — Other Ambulatory Visit (HOSPITAL_COMMUNITY): Payer: Self-pay | Admitting: *Deleted

## 2024-07-20 ENCOUNTER — Other Ambulatory Visit: Payer: Self-pay | Admitting: Pharmacist

## 2024-07-20 ENCOUNTER — Encounter (HOSPITAL_COMMUNITY)
Admission: RE | Admit: 2024-07-20 | Discharge: 2024-07-20 | Disposition: A | Discharge status: Home / Self Care | Source: Ambulatory Visit | Attending: Rheumatology | Admitting: Rheumatology

## 2024-07-20 DIAGNOSIS — Z79899 Other long term (current) drug therapy: Secondary | ICD-10-CM

## 2024-07-20 DIAGNOSIS — M0579 Rheumatoid arthritis with rheumatoid factor of multiple sites without organ or systems involvement: Secondary | ICD-10-CM

## 2024-07-20 MED ORDER — ACETAMINOPHEN 325 MG PO TABS: 650.0000 mg | ORAL_TABLET | Freq: Once | ORAL | Status: DC

## 2024-07-20 MED ORDER — TOCILIZUMAB 400 MG/20ML IV SOLN
4.0000 mg/kg | Freq: Once | INTRAVENOUS | Status: AC
  Administered 2024-07-20: 400 mg via INTRAVENOUS
  Filled 2024-07-20: qty 20

## 2024-07-20 MED ORDER — DIPHENHYDRAMINE HCL 25 MG PO CAPS: 25.0000 mg | ORAL_CAPSULE | Freq: Once | ORAL | Status: DC

## 2024-07-20 NOTE — Progress Notes (Signed)
 Next infusion for Actemra  IV ([id number]) scheduled on 08/17/2024 and due for updated orders. Diagnosis:  Dose: 4mg /kg every 4 weeks  Last Clinic Visit: 01/26/2024 Next Clinic Visit: not scheduled (message sent to scheduling team)  Last infusion: 07/20/2024  Labs: CBC and CMP on 06/22/2024 TB Gold: negative on 02/02/2024   Orders placed for Actemra  IV (J3262) x 2 doses along with premedication of acetaminophen  650mg  p.o. and diphenhydramine  25mg  p.o. to be administered 30 minutes before medication infusion.  Standing CBC with diff/platelet and CMP with GFR orders placed to be drawn every 2 months.  Next TB gold due February 2026  Sherry Pennant, PharmD, MPH, BCPS, CPP Clinical Pharmacist (Rheumatology and Pulmonology)

## 2024-07-21 DIAGNOSIS — L03116 Cellulitis of left lower limb: Secondary | ICD-10-CM | POA: Diagnosis not present

## 2024-07-21 DIAGNOSIS — Z7901 Long term (current) use of anticoagulants: Secondary | ICD-10-CM | POA: Diagnosis not present

## 2024-07-21 DIAGNOSIS — E6609 Other obesity due to excess calories: Secondary | ICD-10-CM | POA: Diagnosis not present

## 2024-07-21 DIAGNOSIS — Z6834 Body mass index (BMI) 34.0-34.9, adult: Secondary | ICD-10-CM | POA: Diagnosis not present

## 2024-07-26 ENCOUNTER — Ambulatory Visit (HOSPITAL_COMMUNITY)
Admission: RE | Admit: 2024-07-26 | Discharge: 2024-07-26 | Disposition: A | Discharge status: Home / Self Care | Source: Ambulatory Visit | Attending: Family Medicine | Admitting: Family Medicine

## 2024-07-26 ENCOUNTER — Other Ambulatory Visit (HOSPITAL_COMMUNITY): Payer: Self-pay | Admitting: Family Medicine

## 2024-07-26 DIAGNOSIS — M7989 Other specified soft tissue disorders: Secondary | ICD-10-CM | POA: Diagnosis not present

## 2024-07-26 DIAGNOSIS — R600 Localized edema: Secondary | ICD-10-CM | POA: Diagnosis not present

## 2024-07-26 DIAGNOSIS — Z6834 Body mass index (BMI) 34.0-34.9, adult: Secondary | ICD-10-CM | POA: Diagnosis not present

## 2024-07-26 DIAGNOSIS — Z7901 Long term (current) use of anticoagulants: Secondary | ICD-10-CM | POA: Diagnosis not present

## 2024-07-26 DIAGNOSIS — E6609 Other obesity due to excess calories: Secondary | ICD-10-CM | POA: Diagnosis not present

## 2024-08-02 DIAGNOSIS — Z7901 Long term (current) use of anticoagulants: Secondary | ICD-10-CM | POA: Diagnosis not present

## 2024-08-09 ENCOUNTER — Other Ambulatory Visit: Payer: Self-pay

## 2024-08-09 DIAGNOSIS — C61 Malignant neoplasm of prostate: Secondary | ICD-10-CM

## 2024-08-13 ENCOUNTER — Ambulatory Visit: Admitting: Urology

## 2024-08-17 ENCOUNTER — Ambulatory Visit: Payer: Self-pay | Admitting: Physician Assistant

## 2024-08-17 ENCOUNTER — Ambulatory Visit (HOSPITAL_COMMUNITY)
Admission: RE | Admit: 2024-08-17 | Discharge: 2024-08-17 | Disposition: A | Discharge status: Home / Self Care | Source: Ambulatory Visit | Attending: Rheumatology | Admitting: Rheumatology

## 2024-08-17 DIAGNOSIS — N179 Acute kidney failure, unspecified: Secondary | ICD-10-CM | POA: Diagnosis not present

## 2024-08-17 DIAGNOSIS — I130 Hypertensive heart and chronic kidney disease with heart failure and stage 1 through stage 4 chronic kidney disease, or unspecified chronic kidney disease: Secondary | ICD-10-CM | POA: Diagnosis not present

## 2024-08-17 DIAGNOSIS — Z87891 Personal history of nicotine dependence: Secondary | ICD-10-CM | POA: Diagnosis not present

## 2024-08-17 DIAGNOSIS — M0579 Rheumatoid arthritis with rheumatoid factor of multiple sites without organ or systems involvement: Secondary | ICD-10-CM | POA: Insufficient documentation

## 2024-08-17 DIAGNOSIS — G40909 Epilepsy, unspecified, not intractable, without status epilepticus: Secondary | ICD-10-CM | POA: Diagnosis not present

## 2024-08-17 DIAGNOSIS — M7989 Other specified soft tissue disorders: Secondary | ICD-10-CM | POA: Diagnosis not present

## 2024-08-17 DIAGNOSIS — E7800 Pure hypercholesterolemia, unspecified: Secondary | ICD-10-CM | POA: Diagnosis not present

## 2024-08-17 DIAGNOSIS — N1831 Chronic kidney disease, stage 3a: Secondary | ICD-10-CM | POA: Diagnosis not present

## 2024-08-17 DIAGNOSIS — L03119 Cellulitis of unspecified part of limb: Secondary | ICD-10-CM | POA: Diagnosis not present

## 2024-08-17 DIAGNOSIS — A403 Sepsis due to Streptococcus pneumoniae: Secondary | ICD-10-CM | POA: Diagnosis not present

## 2024-08-17 DIAGNOSIS — D696 Thrombocytopenia, unspecified: Secondary | ICD-10-CM | POA: Diagnosis not present

## 2024-08-17 DIAGNOSIS — G4733 Obstructive sleep apnea (adult) (pediatric): Secondary | ICD-10-CM | POA: Diagnosis not present

## 2024-08-17 DIAGNOSIS — Z881 Allergy status to other antibiotic agents status: Secondary | ICD-10-CM | POA: Diagnosis not present

## 2024-08-17 DIAGNOSIS — R600 Localized edema: Secondary | ICD-10-CM | POA: Diagnosis not present

## 2024-08-17 DIAGNOSIS — I10 Essential (primary) hypertension: Secondary | ICD-10-CM | POA: Diagnosis not present

## 2024-08-17 DIAGNOSIS — M069 Rheumatoid arthritis, unspecified: Secondary | ICD-10-CM | POA: Diagnosis not present

## 2024-08-17 DIAGNOSIS — R6520 Severe sepsis without septic shock: Secondary | ICD-10-CM | POA: Diagnosis not present

## 2024-08-17 DIAGNOSIS — J441 Chronic obstructive pulmonary disease with (acute) exacerbation: Secondary | ICD-10-CM | POA: Diagnosis not present

## 2024-08-17 DIAGNOSIS — E876 Hypokalemia: Secondary | ICD-10-CM | POA: Diagnosis not present

## 2024-08-17 DIAGNOSIS — G7281 Critical illness myopathy: Secondary | ICD-10-CM | POA: Diagnosis not present

## 2024-08-17 DIAGNOSIS — Z86718 Personal history of other venous thrombosis and embolism: Secondary | ICD-10-CM | POA: Diagnosis not present

## 2024-08-17 DIAGNOSIS — I5032 Chronic diastolic (congestive) heart failure: Secondary | ICD-10-CM | POA: Diagnosis not present

## 2024-08-17 DIAGNOSIS — R7881 Bacteremia: Secondary | ICD-10-CM | POA: Diagnosis not present

## 2024-08-17 DIAGNOSIS — B9562 Methicillin resistant Staphylococcus aureus infection as the cause of diseases classified elsewhere: Secondary | ICD-10-CM | POA: Diagnosis not present

## 2024-08-17 DIAGNOSIS — A419 Sepsis, unspecified organism: Secondary | ICD-10-CM | POA: Diagnosis not present

## 2024-08-17 DIAGNOSIS — N189 Chronic kidney disease, unspecified: Secondary | ICD-10-CM | POA: Diagnosis not present

## 2024-08-17 DIAGNOSIS — M059 Rheumatoid arthritis with rheumatoid factor, unspecified: Secondary | ICD-10-CM | POA: Diagnosis not present

## 2024-08-17 DIAGNOSIS — M79604 Pain in right leg: Secondary | ICD-10-CM | POA: Diagnosis not present

## 2024-08-17 DIAGNOSIS — Z6835 Body mass index (BMI) 35.0-35.9, adult: Secondary | ICD-10-CM | POA: Diagnosis not present

## 2024-08-17 DIAGNOSIS — D849 Immunodeficiency, unspecified: Secondary | ICD-10-CM | POA: Diagnosis not present

## 2024-08-17 DIAGNOSIS — Z8249 Family history of ischemic heart disease and other diseases of the circulatory system: Secondary | ICD-10-CM | POA: Diagnosis not present

## 2024-08-17 DIAGNOSIS — I82432 Acute embolism and thrombosis of left popliteal vein: Secondary | ICD-10-CM | POA: Diagnosis not present

## 2024-08-17 DIAGNOSIS — Z882 Allergy status to sulfonamides status: Secondary | ICD-10-CM | POA: Diagnosis not present

## 2024-08-17 DIAGNOSIS — L0390 Cellulitis, unspecified: Secondary | ICD-10-CM | POA: Diagnosis not present

## 2024-08-17 DIAGNOSIS — Z79899 Other long term (current) drug therapy: Secondary | ICD-10-CM | POA: Insufficient documentation

## 2024-08-17 DIAGNOSIS — A4102 Sepsis due to Methicillin resistant Staphylococcus aureus: Secondary | ICD-10-CM | POA: Diagnosis not present

## 2024-08-17 DIAGNOSIS — R9389 Abnormal findings on diagnostic imaging of other specified body structures: Secondary | ICD-10-CM | POA: Diagnosis not present

## 2024-08-17 DIAGNOSIS — Z7901 Long term (current) use of anticoagulants: Secondary | ICD-10-CM | POA: Diagnosis not present

## 2024-08-17 DIAGNOSIS — L03115 Cellulitis of right lower limb: Secondary | ICD-10-CM | POA: Diagnosis not present

## 2024-08-17 DIAGNOSIS — R509 Fever, unspecified: Secondary | ICD-10-CM | POA: Diagnosis not present

## 2024-08-17 DIAGNOSIS — J449 Chronic obstructive pulmonary disease, unspecified: Secondary | ICD-10-CM | POA: Diagnosis not present

## 2024-08-17 DIAGNOSIS — K219 Gastro-esophageal reflux disease without esophagitis: Secondary | ICD-10-CM | POA: Diagnosis not present

## 2024-08-17 DIAGNOSIS — E66812 Obesity, class 2: Secondary | ICD-10-CM | POA: Diagnosis not present

## 2024-08-17 DIAGNOSIS — Z8546 Personal history of malignant neoplasm of prostate: Secondary | ICD-10-CM | POA: Diagnosis not present

## 2024-08-17 DIAGNOSIS — E11628 Type 2 diabetes mellitus with other skin complications: Secondary | ICD-10-CM | POA: Diagnosis not present

## 2024-08-17 LAB — CBC WITH DIFFERENTIAL/PLATELET
Abs Immature Granulocytes: 0.03 K/uL (ref 0.00–0.07)
Basophils Absolute: 0.1 K/uL (ref 0.0–0.1)
Basophils Relative: 1 %
Eosinophils Absolute: 0.8 K/uL — ABNORMAL HIGH (ref 0.0–0.5)
Eosinophils Relative: 9 %
HCT: 45.1 % (ref 39.0–52.0)
Hemoglobin: 14.9 g/dL (ref 13.0–17.0)
Immature Granulocytes: 0 %
Lymphocytes Relative: 16 %
Lymphs Abs: 1.3 K/uL (ref 0.7–4.0)
MCH: 29.3 pg (ref 26.0–34.0)
MCHC: 33 g/dL (ref 30.0–36.0)
MCV: 88.6 fL (ref 80.0–100.0)
Monocytes Absolute: 1 K/uL (ref 0.1–1.0)
Monocytes Relative: 13 %
Neutro Abs: 4.9 K/uL (ref 1.7–7.7)
Neutrophils Relative %: 61 %
Platelets: 152 K/uL (ref 150–400)
RBC: 5.09 MIL/uL (ref 4.22–5.81)
RDW: 14.9 % (ref 11.5–15.5)
WBC: 8 K/uL (ref 4.0–10.5)
nRBC: 0 % (ref 0.0–0.2)

## 2024-08-17 LAB — COMPREHENSIVE METABOLIC PANEL WITH GFR
ALT: 23 U/L (ref 0–44)
AST: 30 U/L (ref 15–41)
Albumin: 4 g/dL (ref 3.5–5.0)
Alkaline Phosphatase: 52 U/L (ref 38–126)
Anion gap: 10 (ref 5–15)
BUN: 18 mg/dL (ref 8–23)
CO2: 28 mmol/L (ref 22–32)
Calcium: 9.3 mg/dL (ref 8.9–10.3)
Chloride: 102 mmol/L (ref 98–111)
Creatinine, Ser: 1.52 mg/dL — ABNORMAL HIGH (ref 0.61–1.24)
GFR, Estimated: 48 mL/min — ABNORMAL LOW (ref >60)
Glucose, Bld: 110 mg/dL — ABNORMAL HIGH (ref 70–99)
Potassium: 4.1 mmol/L (ref 3.5–5.1)
Sodium: 140 mmol/L (ref 135–145)
Total Bilirubin: 0.9 mg/dL (ref 0.0–1.2)
Total Protein: 6.8 g/dL (ref 6.5–8.1)

## 2024-08-17 MED ORDER — TOCILIZUMAB 400 MG/20ML IV SOLN
4.0000 mg/kg | INTRAVENOUS | Status: DC
  Administered 2024-08-17: 400 mg via INTRAVENOUS
  Filled 2024-08-17: qty 20

## 2024-08-17 MED ORDER — ACETAMINOPHEN 325 MG PO TABS: 650.0000 mg | ORAL_TABLET | Freq: Once | ORAL | Status: DC

## 2024-08-17 MED ORDER — DIPHENHYDRAMINE HCL 25 MG PO CAPS: 25.0000 mg | ORAL_CAPSULE | Freq: Once | ORAL | Status: DC

## 2024-08-17 NOTE — Progress Notes (Signed)
 Absolute eosinophils are slightly elevated-may be related to allergies. Rest of CBC WNL.   Glucose is 110. Creatinine is elevated-1.152 and GFR is low-48.  Rest of CMP WNL.  Avoid the use of NSAIDs and forward results to nephrology.

## 2024-08-17 NOTE — Progress Notes (Signed)
 Renal function remains stable overall--ok to send to PCP

## 2024-08-18 ENCOUNTER — Emergency Department (HOSPITAL_COMMUNITY)

## 2024-08-18 ENCOUNTER — Other Ambulatory Visit: Payer: Self-pay

## 2024-08-18 ENCOUNTER — Inpatient Hospital Stay (HOSPITAL_COMMUNITY)
Admission: EM | Admit: 2024-08-18 | Discharge: 2024-08-23 | DRG: 872 | Disposition: A | Discharge status: Home Health | Attending: Family Medicine | Admitting: Family Medicine

## 2024-08-18 DIAGNOSIS — I82409 Acute embolism and thrombosis of unspecified deep veins of unspecified lower extremity: Secondary | ICD-10-CM | POA: Diagnosis present

## 2024-08-18 DIAGNOSIS — K219 Gastro-esophageal reflux disease without esophagitis: Secondary | ICD-10-CM | POA: Diagnosis present

## 2024-08-18 DIAGNOSIS — Z882 Allergy status to sulfonamides status: Secondary | ICD-10-CM | POA: Diagnosis not present

## 2024-08-18 DIAGNOSIS — G7281 Critical illness myopathy: Secondary | ICD-10-CM | POA: Diagnosis not present

## 2024-08-18 DIAGNOSIS — Z808 Family history of malignant neoplasm of other organs or systems: Secondary | ICD-10-CM

## 2024-08-18 DIAGNOSIS — Z6835 Body mass index (BMI) 35.0-35.9, adult: Secondary | ICD-10-CM | POA: Diagnosis not present

## 2024-08-18 DIAGNOSIS — I130 Hypertensive heart and chronic kidney disease with heart failure and stage 1 through stage 4 chronic kidney disease, or unspecified chronic kidney disease: Secondary | ICD-10-CM | POA: Diagnosis present

## 2024-08-18 DIAGNOSIS — L0390 Cellulitis, unspecified: Secondary | ICD-10-CM | POA: Diagnosis not present

## 2024-08-18 DIAGNOSIS — N189 Chronic kidney disease, unspecified: Secondary | ICD-10-CM | POA: Diagnosis not present

## 2024-08-18 DIAGNOSIS — I5032 Chronic diastolic (congestive) heart failure: Secondary | ICD-10-CM | POA: Diagnosis present

## 2024-08-18 DIAGNOSIS — Z801 Family history of malignant neoplasm of trachea, bronchus and lung: Secondary | ICD-10-CM

## 2024-08-18 DIAGNOSIS — Z86718 Personal history of other venous thrombosis and embolism: Secondary | ICD-10-CM

## 2024-08-18 DIAGNOSIS — Z7901 Long term (current) use of anticoagulants: Secondary | ICD-10-CM

## 2024-08-18 DIAGNOSIS — M19072 Primary osteoarthritis, left ankle and foot: Secondary | ICD-10-CM | POA: Diagnosis present

## 2024-08-18 DIAGNOSIS — E876 Hypokalemia: Secondary | ICD-10-CM | POA: Diagnosis present

## 2024-08-18 DIAGNOSIS — L03119 Cellulitis of unspecified part of limb: Secondary | ICD-10-CM | POA: Diagnosis not present

## 2024-08-18 DIAGNOSIS — I82432 Acute embolism and thrombosis of left popliteal vein: Secondary | ICD-10-CM | POA: Diagnosis present

## 2024-08-18 DIAGNOSIS — Z820 Family history of epilepsy and other diseases of the nervous system: Secondary | ICD-10-CM

## 2024-08-18 DIAGNOSIS — Z803 Family history of malignant neoplasm of breast: Secondary | ICD-10-CM

## 2024-08-18 DIAGNOSIS — Z8052 Family history of malignant neoplasm of bladder: Secondary | ICD-10-CM

## 2024-08-18 DIAGNOSIS — Z8249 Family history of ischemic heart disease and other diseases of the circulatory system: Secondary | ICD-10-CM | POA: Diagnosis not present

## 2024-08-18 DIAGNOSIS — G4733 Obstructive sleep apnea (adult) (pediatric): Secondary | ICD-10-CM | POA: Diagnosis present

## 2024-08-18 DIAGNOSIS — M19071 Primary osteoarthritis, right ankle and foot: Secondary | ICD-10-CM | POA: Diagnosis present

## 2024-08-18 DIAGNOSIS — Z888 Allergy status to other drugs, medicaments and biological substances status: Secondary | ICD-10-CM

## 2024-08-18 DIAGNOSIS — J441 Chronic obstructive pulmonary disease with (acute) exacerbation: Secondary | ICD-10-CM | POA: Diagnosis present

## 2024-08-18 DIAGNOSIS — M069 Rheumatoid arthritis, unspecified: Secondary | ICD-10-CM | POA: Diagnosis present

## 2024-08-18 DIAGNOSIS — Z87891 Personal history of nicotine dependence: Secondary | ICD-10-CM | POA: Diagnosis not present

## 2024-08-18 DIAGNOSIS — B9562 Methicillin resistant Staphylococcus aureus infection as the cause of diseases classified elsewhere: Secondary | ICD-10-CM | POA: Diagnosis not present

## 2024-08-18 DIAGNOSIS — A4102 Sepsis due to Methicillin resistant Staphylococcus aureus: Secondary | ICD-10-CM | POA: Diagnosis present

## 2024-08-18 DIAGNOSIS — D696 Thrombocytopenia, unspecified: Secondary | ICD-10-CM | POA: Diagnosis not present

## 2024-08-18 DIAGNOSIS — R6520 Severe sepsis without septic shock: Secondary | ICD-10-CM | POA: Diagnosis not present

## 2024-08-18 DIAGNOSIS — Z8546 Personal history of malignant neoplasm of prostate: Secondary | ICD-10-CM

## 2024-08-18 DIAGNOSIS — Z800 Family history of malignant neoplasm of digestive organs: Secondary | ICD-10-CM

## 2024-08-18 DIAGNOSIS — E7800 Pure hypercholesterolemia, unspecified: Secondary | ICD-10-CM | POA: Diagnosis present

## 2024-08-18 DIAGNOSIS — A4902 Methicillin resistant Staphylococcus aureus infection, unspecified site: Secondary | ICD-10-CM

## 2024-08-18 DIAGNOSIS — L03115 Cellulitis of right lower limb: Secondary | ICD-10-CM | POA: Diagnosis present

## 2024-08-18 DIAGNOSIS — D849 Immunodeficiency, unspecified: Secondary | ICD-10-CM | POA: Diagnosis present

## 2024-08-18 DIAGNOSIS — N179 Acute kidney failure, unspecified: Secondary | ICD-10-CM | POA: Diagnosis present

## 2024-08-18 DIAGNOSIS — A403 Sepsis due to Streptococcus pneumoniae: Secondary | ICD-10-CM | POA: Diagnosis not present

## 2024-08-18 DIAGNOSIS — E66812 Obesity, class 2: Secondary | ICD-10-CM | POA: Diagnosis present

## 2024-08-18 DIAGNOSIS — R0609 Other forms of dyspnea: Secondary | ICD-10-CM | POA: Diagnosis present

## 2024-08-18 DIAGNOSIS — Z8709 Personal history of other diseases of the respiratory system: Secondary | ICD-10-CM

## 2024-08-18 DIAGNOSIS — A419 Sepsis, unspecified organism: Principal | ICD-10-CM | POA: Diagnosis present

## 2024-08-18 DIAGNOSIS — M109 Gout, unspecified: Secondary | ICD-10-CM | POA: Diagnosis present

## 2024-08-18 DIAGNOSIS — Z886 Allergy status to analgesic agent status: Secondary | ICD-10-CM

## 2024-08-18 DIAGNOSIS — Z79899 Other long term (current) drug therapy: Secondary | ICD-10-CM

## 2024-08-18 DIAGNOSIS — M059 Rheumatoid arthritis with rheumatoid factor, unspecified: Secondary | ICD-10-CM | POA: Diagnosis not present

## 2024-08-18 DIAGNOSIS — C61 Malignant neoplasm of prostate: Secondary | ICD-10-CM | POA: Diagnosis present

## 2024-08-18 DIAGNOSIS — Z881 Allergy status to other antibiotic agents status: Secondary | ICD-10-CM | POA: Diagnosis not present

## 2024-08-18 DIAGNOSIS — R531 Weakness: Secondary | ICD-10-CM

## 2024-08-18 DIAGNOSIS — Z7951 Long term (current) use of inhaled steroids: Secondary | ICD-10-CM

## 2024-08-18 DIAGNOSIS — Z823 Family history of stroke: Secondary | ICD-10-CM

## 2024-08-18 DIAGNOSIS — R7881 Bacteremia: Secondary | ICD-10-CM | POA: Diagnosis not present

## 2024-08-18 DIAGNOSIS — N1831 Chronic kidney disease, stage 3a: Secondary | ICD-10-CM | POA: Diagnosis present

## 2024-08-18 DIAGNOSIS — E11628 Type 2 diabetes mellitus with other skin complications: Secondary | ICD-10-CM | POA: Diagnosis not present

## 2024-08-18 DIAGNOSIS — G40909 Epilepsy, unspecified, not intractable, without status epilepticus: Secondary | ICD-10-CM | POA: Diagnosis present

## 2024-08-18 DIAGNOSIS — Z8669 Personal history of other diseases of the nervous system and sense organs: Secondary | ICD-10-CM

## 2024-08-18 LAB — URINALYSIS, W/ REFLEX TO CULTURE (INFECTION SUSPECTED)
Bacteria, UA: NONE SEEN
Bilirubin Urine: NEGATIVE
Glucose, UA: NEGATIVE mg/dL
Hgb urine dipstick: NEGATIVE
Ketones, ur: NEGATIVE mg/dL
Leukocytes,Ua: NEGATIVE
Nitrite: NEGATIVE
Protein, ur: NEGATIVE mg/dL
Specific Gravity, Urine: 1.014 (ref 1.005–1.030)
pH: 5 (ref 5.0–8.0)

## 2024-08-18 LAB — LACTIC ACID, PLASMA
Lactic Acid, Venous: 2.3 mmol/L (ref 0.5–1.9)
Lactic Acid, Venous: 1.6 mmol/L (ref 0.5–1.9)
Lactic Acid, Venous: 1.1 mmol/L (ref 0.5–1.9)
Lactic Acid, Venous: 1.7 mmol/L (ref 0.5–1.9)

## 2024-08-18 LAB — CBC WITH DIFFERENTIAL/PLATELET
Abs Immature Granulocytes: 0.08 K/uL — ABNORMAL HIGH (ref 0.00–0.07)
Basophils Absolute: 0.1 K/uL (ref 0.0–0.1)
Basophils Relative: 0 %
Eosinophils Absolute: 0.1 K/uL (ref 0.0–0.5)
Eosinophils Relative: 1 %
HCT: 43.4 % (ref 39.0–52.0)
Hemoglobin: 14.3 g/dL (ref 13.0–17.0)
Immature Granulocytes: 0 %
Lymphocytes Relative: 4 %
Lymphs Abs: 0.7 K/uL (ref 0.7–4.0)
MCH: 30 pg (ref 26.0–34.0)
MCHC: 32.9 g/dL (ref 30.0–36.0)
MCV: 91 fL (ref 80.0–100.0)
Monocytes Absolute: 1 K/uL (ref 0.1–1.0)
Monocytes Relative: 6 %
Neutro Abs: 16 K/uL — ABNORMAL HIGH (ref 1.7–7.7)
Neutrophils Relative %: 89 %
Platelets: 142 K/uL — ABNORMAL LOW (ref 150–400)
RBC: 4.77 MIL/uL (ref 4.22–5.81)
RDW: 15.3 % (ref 11.5–15.5)
WBC: 18 K/uL — ABNORMAL HIGH (ref 4.0–10.5)
nRBC: 0 % (ref 0.0–0.2)

## 2024-08-18 LAB — COMPREHENSIVE METABOLIC PANEL WITH GFR
ALT: 22 U/L (ref 0–44)
AST: 29 U/L (ref 15–41)
Albumin: 3.9 g/dL (ref 3.5–5.0)
Alkaline Phosphatase: 58 U/L (ref 38–126)
Anion gap: 14 (ref 5–15)
BUN: 18 mg/dL (ref 8–23)
CO2: 26 mmol/L (ref 22–32)
Calcium: 9.2 mg/dL (ref 8.9–10.3)
Chloride: 100 mmol/L (ref 98–111)
Creatinine, Ser: 1.59 mg/dL — ABNORMAL HIGH (ref 0.61–1.24)
GFR, Estimated: 46 mL/min — ABNORMAL LOW (ref >60)
Glucose, Bld: 121 mg/dL — ABNORMAL HIGH (ref 70–99)
Potassium: 3.5 mmol/L (ref 3.5–5.1)
Sodium: 140 mmol/L (ref 135–145)
Total Bilirubin: 1 mg/dL (ref 0.0–1.2)
Total Protein: 6.9 g/dL (ref 6.5–8.1)

## 2024-08-18 LAB — PROTIME-INR
INR: 3.2 — ABNORMAL HIGH (ref 0.8–1.2)
Prothrombin Time: 34.6 s — ABNORMAL HIGH (ref 11.4–15.2)

## 2024-08-18 LAB — PHOSPHORUS: Phosphorus: 3.3 mg/dL (ref 2.5–4.6)

## 2024-08-18 LAB — MAGNESIUM: Magnesium: 1.8 mg/dL (ref 1.7–2.4)

## 2024-08-18 MED ORDER — BUDESON-GLYCOPYRROL-FORMOTEROL 160-9-4.8 MCG/ACT IN AERO
2.0000 | INHALATION_SPRAY | Freq: Two times a day (BID) | RESPIRATORY_TRACT | Status: DC
  Administered 2024-08-19 – 2024-08-23 (×9): 2 via RESPIRATORY_TRACT

## 2024-08-18 MED ORDER — SODIUM CHLORIDE 0.9 % IV SOLN: INTRAVENOUS | Status: DC

## 2024-08-18 MED ORDER — WARFARIN SODIUM 3 MG PO TABS: 3.0000 mg | ORAL_TABLET | Freq: Every day | ORAL | Status: DC

## 2024-08-18 MED ORDER — ONDANSETRON HCL 4 MG/2ML IJ SOLN: 4.0000 mg | Freq: Four times a day (QID) | INTRAMUSCULAR | Status: DC | PRN

## 2024-08-18 MED ORDER — BUDESON-GLYCOPYRROL-FORMOTEROL 160-9-4.8 MCG/ACT IN AERO
2.0000 | INHALATION_SPRAY | Freq: Every morning | RESPIRATORY_TRACT | Status: DC
  Filled 2024-08-18: qty 5.9

## 2024-08-18 MED ORDER — SODIUM CHLORIDE 0.9 % IV BOLUS
1000.0000 mL | Freq: Once | INTRAVENOUS | Status: AC
  Administered 2024-08-18: 1000 mL via INTRAVENOUS

## 2024-08-18 MED ORDER — SPIRONOLACTONE 25 MG PO TABS
50.0000 mg | ORAL_TABLET | Freq: Every day | ORAL | Status: DC
  Administered 2024-08-18 – 2024-08-23 (×6): 50 mg via ORAL
  Filled 2024-08-18 (×6): qty 2

## 2024-08-18 MED ORDER — FLEET ENEMA RE ENEM: 1.0000 | ENEMA | Freq: Once | RECTAL | Status: DC | PRN

## 2024-08-18 MED ORDER — PHENYTOIN SODIUM EXTENDED 100 MG PO CAPS
200.0000 mg | ORAL_CAPSULE | Freq: Every day | ORAL | Status: DC
  Administered 2024-08-18 – 2024-08-22 (×5): 200 mg via ORAL
  Filled 2024-08-18 (×5): qty 2

## 2024-08-18 MED ORDER — OXYCODONE HCL 5 MG PO TABS: 5.0000 mg | ORAL_TABLET | ORAL | Status: DC | PRN

## 2024-08-18 MED ORDER — MUPIROCIN 2 % EX OINT
TOPICAL_OINTMENT | Freq: Three times a day (TID) | CUTANEOUS | Status: AC
  Administered 2024-08-20: 1 via TOPICAL
  Filled 2024-08-18 (×2): qty 22

## 2024-08-18 MED ORDER — PANTOPRAZOLE SODIUM 40 MG PO TBEC
40.0000 mg | DELAYED_RELEASE_TABLET | Freq: Every day | ORAL | Status: DC
  Administered 2024-08-19 – 2024-08-23 (×5): 40 mg via ORAL
  Filled 2024-08-18 (×5): qty 1

## 2024-08-18 MED ORDER — ROSUVASTATIN CALCIUM 10 MG PO TABS
10.0000 mg | ORAL_TABLET | Freq: Every day | ORAL | Status: DC
  Administered 2024-08-18 – 2024-08-21 (×4): 10 mg via ORAL
  Filled 2024-08-18 (×4): qty 1

## 2024-08-18 MED ORDER — SODIUM CHLORIDE 0.9% FLUSH
3.0000 mL | Freq: Two times a day (BID) | INTRAVENOUS | Status: DC
  Administered 2024-08-18 – 2024-08-22 (×8): 3 mL via INTRAVENOUS

## 2024-08-18 MED ORDER — LACTATED RINGERS IV BOLUS (SEPSIS): 1000.0000 mL | Freq: Once | INTRAVENOUS | Status: DC

## 2024-08-18 MED ORDER — LINEZOLID 600 MG/300ML IV SOLN
600.0000 mg | Freq: Two times a day (BID) | INTRAVENOUS | Status: DC
  Administered 2024-08-18 – 2024-08-19 (×3): 600 mg via INTRAVENOUS
  Filled 2024-08-18 (×5): qty 300

## 2024-08-18 MED ORDER — LACTATED RINGERS IV SOLN
150.0000 mL/h | INTRAVENOUS | Status: DC
  Administered 2024-08-18 – 2024-08-19 (×2): 150 mL/h via INTRAVENOUS

## 2024-08-18 MED ORDER — SODIUM CHLORIDE 0.9% FLUSH
3.0000 mL | Freq: Two times a day (BID) | INTRAVENOUS | Status: DC
  Administered 2024-08-18 – 2024-08-23 (×11): 3 mL via INTRAVENOUS

## 2024-08-18 MED ORDER — PHENYTOIN SODIUM EXTENDED 100 MG PO CAPS: 100.0000 mg | ORAL_CAPSULE | ORAL | Status: DC

## 2024-08-18 MED ORDER — SENNOSIDES-DOCUSATE SODIUM 8.6-50 MG PO TABS: 1.0000 | ORAL_TABLET | Freq: Every evening | ORAL | Status: DC | PRN

## 2024-08-18 MED ORDER — SODIUM CHLORIDE 0.9 % IV SOLN
2.0000 g | Freq: Once | INTRAVENOUS | Status: AC
  Administered 2024-08-18: 2 g via INTRAVENOUS
  Filled 2024-08-18: qty 20

## 2024-08-18 MED ORDER — BISACODYL 5 MG PO TBEC: 5.0000 mg | DELAYED_RELEASE_TABLET | Freq: Every day | ORAL | Status: DC | PRN

## 2024-08-18 MED ORDER — HYDROMORPHONE HCL 1 MG/ML IJ SOLN: 0.5000 mg | INTRAMUSCULAR | Status: DC | PRN

## 2024-08-18 MED ORDER — ONDANSETRON HCL 4 MG PO TABS: 4.0000 mg | ORAL_TABLET | Freq: Four times a day (QID) | ORAL | Status: DC | PRN

## 2024-08-18 MED ORDER — ACETAMINOPHEN 325 MG PO TABS: 650.0000 mg | ORAL_TABLET | Freq: Four times a day (QID) | ORAL | Status: DC | PRN

## 2024-08-18 MED ORDER — HYDRALAZINE HCL 20 MG/ML IJ SOLN
10.0000 mg | INTRAMUSCULAR | Status: DC | PRN
  Administered 2024-08-20: 10 mg via INTRAVENOUS
  Filled 2024-08-18: qty 1

## 2024-08-18 MED ORDER — LACTATED RINGERS IV BOLUS (SEPSIS)
1000.0000 mL | Freq: Once | INTRAVENOUS | Status: AC
  Administered 2024-08-18: 1000 mL via INTRAVENOUS

## 2024-08-18 MED ORDER — FUROSEMIDE 40 MG PO TABS
80.0000 mg | ORAL_TABLET | Freq: Every day | ORAL | Status: DC
  Administered 2024-08-19 – 2024-08-21 (×3): 80 mg via ORAL
  Filled 2024-08-18 (×3): qty 2

## 2024-08-18 MED ORDER — ACETAMINOPHEN 650 MG RE SUPP
650.0000 mg | Freq: Four times a day (QID) | RECTAL | Status: DC | PRN
Start: 2024-08-18 — End: 2024-08-23

## 2024-08-18 MED ORDER — GUAIFENESIN-CODEINE 100-10 MG/5ML PO SOLN: 10.0000 mL | Freq: Four times a day (QID) | ORAL | Status: DC | PRN

## 2024-08-18 MED ORDER — LEVALBUTEROL HCL 0.63 MG/3ML IN NEBU: 0.6300 mg | INHALATION_SOLUTION | Freq: Four times a day (QID) | RESPIRATORY_TRACT | Status: DC | PRN

## 2024-08-18 MED ORDER — FLUTICASONE FUROATE-VILANTEROL 100-25 MCG/ACT IN AEPB: 1.0000 | INHALATION_SPRAY | Freq: Every day | RESPIRATORY_TRACT | Status: DC

## 2024-08-18 MED ORDER — DICLOFENAC SODIUM 1 % EX GEL
2.0000 g | Freq: Four times a day (QID) | CUTANEOUS | Status: DC
  Administered 2024-08-18 – 2024-08-23 (×11): 2 g via TOPICAL
  Filled 2024-08-18 (×2): qty 100

## 2024-08-18 MED ORDER — OXYBUTYNIN CHLORIDE ER 5 MG PO TB24
10.0000 mg | ORAL_TABLET | Freq: Every day | ORAL | Status: DC
  Administered 2024-08-18 – 2024-08-23 (×6): 10 mg via ORAL
  Filled 2024-08-18 (×6): qty 2

## 2024-08-18 MED ORDER — ALLOPURINOL 100 MG PO TABS
100.0000 mg | ORAL_TABLET | Freq: Two times a day (BID) | ORAL | Status: DC
  Administered 2024-08-18 – 2024-08-23 (×10): 100 mg via ORAL
  Filled 2024-08-18 (×10): qty 1

## 2024-08-18 MED ORDER — PHENYTOIN SODIUM EXTENDED 100 MG PO CAPS
100.0000 mg | ORAL_CAPSULE | Freq: Every day | ORAL | Status: DC
  Administered 2024-08-18 – 2024-08-23 (×6): 100 mg via ORAL
  Filled 2024-08-18 (×6): qty 1

## 2024-08-18 MED ORDER — LEFLUNOMIDE 20 MG PO TABS
20.0000 mg | ORAL_TABLET | Freq: Every day | ORAL | Status: DC
Start: 2024-08-19 — End: 2024-08-23
  Administered 2024-08-19 – 2024-08-22 (×3): 20 mg via ORAL
  Filled 2024-08-18 (×6): qty 1

## 2024-08-18 MED ORDER — TRAZODONE HCL 50 MG PO TABS: 25.0000 mg | ORAL_TABLET | Freq: Every evening | ORAL | Status: DC | PRN

## 2024-08-18 MED ORDER — IPRATROPIUM BROMIDE 0.02 % IN SOLN: 0.5000 mg | Freq: Four times a day (QID) | RESPIRATORY_TRACT | Status: DC | PRN

## 2024-08-18 NOTE — Assessment & Plan Note (Signed)
-   Will monitor I's and O's, daily weights -Unfortunately patient needs fluid resuscitation due to meeting sepsis criteria -Will monitor closely for volume overload, resuming his home medication of 80 mg Lasix  in a.m. -Last echo March 2023 EGF 55-60% normal LV function normal RV function valves within normal limits, no LVH

## 2024-08-18 NOTE — Assessment & Plan Note (Signed)
 Follow-up with urologist as outpatient.

## 2024-08-18 NOTE — ED Notes (Signed)
 [place] in progress

## 2024-08-18 NOTE — Progress Notes (Signed)
   08/18/24 2235  BiPAP/CPAP/SIPAP  BiPAP/CPAP/SIPAP Pt Type Adult  BiPAP/CPAP/SIPAP Resmed  Dentures removed? Not applicable  Respiratory Rate 18 breaths/min  FiO2 (%) 21 %  Patient Home Machine Yes  Safety Check Completed by RT for Home Unit Yes, no issues noted  Patient Home Mask Yes  Patient Home Tubing Yes  Auto Titrate  (patient is on home settings)  Device Plugged into RED Power Outlet Yes

## 2024-08-18 NOTE — Assessment & Plan Note (Signed)
-   Currently stable, continue inhalers as needed, supplemental oxygen  as needed to maintain O2 sat greater than 90%

## 2024-08-18 NOTE — H&P (Signed)
 History and Physical   Patient: [name]                            PCP: Marvine Rush, MD                    DOB: January 10, 1951            DOA: 08/18/2024 FMW:[id number]             DOS: 08/18/2024, 12:37 PM  Marvine Rush, MD  Patient coming from:   HOME  I have personally reviewed patient's medical records, in electronic medical records, including:  [place] link, and care everywhere.    Chief Complaint:   Chief Complaint  Patient presents with   Leg Pain    History of present illness:    [name] is a 73 year old male with extensive history of rheumatoid arthritis osteoarthritis, chronic DVT on Coumadin , diastolic CHF, COPD, gout, GERD..  Presented to ED with chief complaint of right leg pain and fever.  Reported has been going on for past 2 weeks.  Chronic right lower extremity edema.  Ultrasound 2 weeks ago was negative for DVT.  Otherwise is progressively becoming more erythematous with edema. Tmax at home 100.6.  No other complaints.   ED evaluation: Blood pressure (!) 153/83, pulse 78, temperature 97.9 F (36.6 C), temperature source Oral, resp. rate 17, SpO2 96%.  Abnormal labs: WBC 18, platelets 142, creatinine 1.59, INR 3.2, Cr 2.3>> 1.6  Lower extremity Doppler study: Right soft tissue swelling, cellulitis, left: Partially occlusive left popliteal vein thrombus with lack of complete compressibilit   Patient was given broad-spectrum antibiotics Rocephin , IV fluids requested to be admitted for cellulitis    Patient Denies having: Fever, Chills, Cough, SOB, Chest Pain, Abd pain, N/V/D, headache, dizziness, lightheadedness,  Dysuria, Joint pain, rash, open wounds  ED Course:   Blood pressure (!) 153/83, pulse 78, temperature 97.9 F (36.6 C), temperature source Oral, resp. rate 17, SpO2 96%. Abnormal labs;   Review of Systems: As per HPI, otherwise 10 point review of systems were negative.    ----------------------------------------------------------------------------------------------------------------------  Allergies  Allergen Reactions   Orencia  [Abatacept ] Anaphylaxis    Had prostate cancer   Carbamazepine Rash    REACTION: makes drowsy BRAND NAME IS TEGRETOL   Celecoxib Itching and Rash    BRAND NAME IS CELEBREX   Cephalexin Rash    Severe Rash.  BRAND NAME IS KEFLEX.  Patient has tolerated ceftriaxone  05/2019   Enbrel [Etanercept] Rash   Humira [Adalimumab] Rash   Levofloxacin  Other (See Comments)    REACTION: GI Intolerance BRAND NAME IS LEVAQUIN    Sulfa Antibiotics Rash   Sulfasalazine Rash    Home MEDs:  Prior to Admission medications   Medication Sig Start Date End Date Taking? Authorizing Provider  allopurinol  (ZYLOPRIM ) 100 MG tablet Take 1 tablet (100 mg total) by mouth 2 (two) times daily. 02/12/23   Cheryl Waddell HERO, PA-C  ascorbic acid (VITAMIN C) 500 MG tablet Take 500 mg by mouth daily.    [provider]  Budeson-Glycopyrrol-Formoterol  (BREZTRI  AEROSPHERE) 160-9-4.8 MCG/ACT AERO Inhale 2 puffs into the lungs in the morning. 02/06/24   Geronimo Amel, MD  cholecalciferol (VITAMIN D3) 25 MCG (1000 UNIT) tablet Take 1,000 Units by mouth daily.    [provider]  diclofenac  Sodium (VOLTAREN ) 1 % GEL Apply 2-4 grams to affected joint 4 times daily as needed. Patient taking  differently: Apply 2-4 grams to affected joint 4 times daily as needed for pain 03/02/20   Cheryl Waddell HERO, PA-C  dicyclomine  (BENTYL ) 10 MG capsule TAKE 1 CAPSULE BY MOUTH 4 TIMES DAILY BEFORE MEALS AND AT BEDTIME Patient taking differently: Take 20 mg by mouth 2 (two) times daily. 08/11/17   Aneita Gwendlyn DASEN, MD  fluticasone  furoate-vilanterol (BREO ELLIPTA ) 100-25 MCG/ACT AEPB USE 1 INHALATION ORALLY EVERY DAY Patient not taking: Reported on 04/01/2024 02/06/24   Geronimo Amel, MD  furosemide  (LASIX ) 80 MG tablet TAKE 1 TABLET BY MOUTH EVERY DAY 08/19/22   Croitoru,  Mihai, MD  guaiFENesin -codeine  100-10 MG/5ML syrup Take 10 mLs by mouth every 6 (six) hours as needed for cough. Patient not taking: Reported on 04/01/2024 12/27/23   Geroldine Berg, MD  leflunomide  (ARAVA ) 20 MG tablet TAKE 1 TABLET BY MOUTH EVERY DAY 03/15/24   Cheryl Waddell HERO, PA-C  levocetirizine (XYZAL) 5 MG tablet Take 5 mg by mouth every evening.    [provider]  mupirocin  ointment (BACTROBAN ) 2 %  06/02/23   [provider]  omeprazole  (PRILOSEC) 40 MG capsule Take 1 capsule (40 mg total) by mouth daily. 01/25/23   Pearlean Manus, MD  oxybutynin  (DITROPAN -XL) 10 MG 24 hr tablet Take 1 tablet (10 mg total) by mouth daily. 03/15/22   McKenzie, Belvie CROME, MD  phenytoin  (DILANTIN ) 100 MG ER capsule Take 100-200 mg by mouth See admin instructions. Take one capsule in the morning and 2 capsules at bedtime    [provider]  Potassium Chloride  ER 20 MEQ TBCR Take 20 mEq by mouth daily. 02/24/19   [provider]  rosuvastatin  (CRESTOR ) 10 MG tablet Take 10 mg by mouth daily. 06/14/20   [provider]  spironolactone  (ALDACTONE ) 50 MG tablet TAKE 1 TABLET BY MOUTH EVERY DAY 05/03/24   Croitoru, Mihai, MD  Tocilizumab  (ACTEMRA  IV) Inject into the vein every 28 (twenty-eight) days.    [provider]  warfarin (COUMADIN ) 3 MG tablet Take 1 tablet (3 mg total) by mouth at bedtime. Restart on 3/24 (48 hours after surgery) 03/22/21   Gaston Hamilton, MD    PRN MEDs: acetaminophen  **OR** acetaminophen , bisacodyl , guaiFENesin -codeine , hydrALAZINE , HYDROmorphone  (DILAUDID ) injection, ipratropium, levalbuterol , ondansetron  **OR** ondansetron  (ZOFRAN ) IV, oxyCODONE , senna-docusate, sodium phosphate , traZODone   Past Medical History:  Diagnosis Date   Arthritis    RA   Asthma    CHF (congestive heart failure) (HCC)    pt denies    Clotting disorder (HCC)    DVT both legs    COPD (chronic obstructive pulmonary disease) (HCC)    DDD (degenerative disc  disease), cervical    with UE's paresthesias   Diverticulosis    DVT, lower extremity (HCC)    bilat   Eye abnormality    right eye drifts has difficulty focusing 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 Unknown origin.   Shortness of breath dyspnea    exertion    Sleep apnea    cpap    Tubular  adenoma of colon 04/2008    Past Surgical History:  Procedure Laterality Date   CHOLECYSTECTOMY     CIRCUMCISION     COLONOSCOPY     CYSTOSCOPY WITH LITHOLAPAXY N/A 03/17/2019   Procedure: CYSTOSCOPY WITH LITHOLAPAXY AND REMOVAL OF FOREIGN BODY;  Surgeon: Sherrilee Belvie CROME, MD;  Location: [facility];  Service: Urology;  Laterality: N/A;   DOPPLER ECHOCARDIOGRAPHY N/A 01-21-2012   TECHNICALLY DIFFICULT. MILD CONCENTRIC LV HYPERTROPHY. LV CAVITY IS SMALL.. EF=> 55%. TRANSMITRAL SPECTRAL FLOW PATTREN IS SUGGESTIVE OF IMPAIRED LV RELAXATION. RV SYSTOLIC PRESSURE IS . LEFT ATRIAL SIZE IS NORMAL. AV APPEARS MILDLY SCLEROTIC. NO SIGN VALVE DISEASE NOTED.   LEFT HEART CATH AND CORONARY ANGIOGRAPHY N/A 03/12/2022   Procedure: LEFT HEART CATH AND CORONARY ANGIOGRAPHY;  Surgeon: Wonda Sharper, MD;  Location: [facility] INVASIVE CV LAB;  Service: Cardiovascular;  Laterality: N/A;   LYMPHADENECTOMY Bilateral 08/30/2015   Procedure: PELVIC LYMPHADENECTOMY;  Surgeon: Belvie CROME Sherrilee, MD;  Location: [facility];  Service: Urology;  Laterality: Bilateral;   NUCLEAR STRESS TEST N/A 01-21-2012   NORMAL PATTERN OF PERFUSION IN ALL REGIONS. POST STRESS LV SIZE IS NORMAL. NO EVIDENCE OF INDUCIBLE ISCHEMIA. EF 54%.   POLYPECTOMY     PROSTATE BIOPSY     ROBOT ASSISTED LAPAROSCOPIC  RADICAL PROSTATECTOMY N/A 08/30/2015   Procedure: ROBOTIC ASSISTED LAPAROSCOPIC RADICAL PROSTATECTOMY;  Surgeon: Belvie CROME Sherrilee, MD;  Location: [facility];  Service: Urology;  Laterality: N/A;   URINARY SPHINCTER IMPLANT N/A 03/20/2021   Procedure: ARTIFICIAL URINARY SPHINCTER CYSTOSCOPY;  Surgeon: Gaston Hamilton, MD;  Location: [facility];  Service: Urology;  Laterality: N/A;  REQUESTING 90 MINS   US  VENOUS LOWER EXT Right 03/07/11   PERSISTENT DVT IN RIGHT LOWER EXT.WITH PERSISTANT VISUALIZATION OF HYPOECHOIC THROMBUS WITHIN THE FEMORAL, PROFUNDA FEMORAL AND POPLITEAL VEINS. WHEN COMPARED TO PREVIOUS, CLOT IS NO LONGER IDENTIFIED WITH IN THE RIGHT CFV.     reports that he quit smoking about 27 years ago. His smoking use included cigarettes. He started smoking about 47 years ago. He has a 60 pack-year smoking history. He has never been exposed to tobacco smoke. He has never used smokeless tobacco. He reports that he does not drink alcohol and does not use drugs.   Family History  Problem Relation Age of Onset   Alzheimer's disease Mother    Skin cancer Father    Stomach cancer Father    Heart attack Father    Breast cancer Sister    Heart attack Sister    Stroke Sister    Bladder Cancer Sister    Heart murmur Sister    Esophageal cancer Brother    Throat cancer Brother    Stomach cancer Brother    Lung cancer Brother    Colon cancer Brother    Heart attack Brother    Heart attack Brother    Colon cancer Brother    Colon polyps Neg Hx    Rectal cancer Neg Hx     Physical Exam:   Vitals:   08/18/24 1100 08/18/24 1130 08/18/24 1200 08/18/24 1220  BP: 138/72 (!) 148/75 (!) 153/83   Pulse: 77 75 78   Resp: 19 (!) 21 17   Temp:    97.9 F (36.6 C)  TempSrc:    Oral  SpO2: 96% 95% 96%    Constitutional: NAD, calm, comfortable Eyes: PERRL, lids and conjunctivae normal ENMT: Mucous membranes are moist. Posterior pharynx clear of any exudate or lesions.Normal dentition.  Neck:  normal, supple, no  masses, no thyromegaly Respiratory: clear to auscultation bilaterally, no wheezing, no crackles. Normal respiratory effort. No accessory muscle use.  Cardiovascular: Regular rate and rhythm, no murmurs / rubs / gallops. No extremity edema. 2+ pedal pulses. No carotid bruits.  Abdomen: no tenderness, no masses palpated. No hepatosplenomegaly. Bowel sounds positive.  Musculoskeletal: no clubbing / cyanosis. No joint deformity upper and lower extremities. Good ROM, no contractures. Normal muscle tone.  Neurologic: CN II-XII grossly intact. Sensation intact, DTR normal. Strength 5/5 in all 4.  Psychiatric: Normal judgment and insight. Alert and oriented x 3. Normal mood.  Skin: no rashes, lesions, ulcers. No induration Decubitus/ulcers:  Wounds: per nursing documentation         Labs on admission:    I have personally reviewed following labs and imaging studies  CBC: Recent Labs  Lab 08/17/24 1058 08/18/24 0930  WBC 8.0 18.0*  NEUTROABS 4.9 16.0*  HGB 14.9 14.3  HCT 45.1 43.4  MCV 88.6 91.0  PLT 152 142*   Basic Metabolic Panel: Recent Labs  Lab 08/17/24 1058 08/18/24 0930  NA 140 140  K 4.1 3.5  CL 102 100  CO2 28 26  GLUCOSE 110* 121*  BUN 18 18  CREATININE 1.52* 1.59*  CALCIUM  9.3 9.2   GFR: Estimated Creatinine Clearance: 48.2 mL/min (A) (by C-G formula based on SCr of 1.59 mg/dL (H)). Liver Function Tests: Recent Labs  Lab 08/17/24 1058 08/18/24 0930  AST 30 29  ALT 23 22  ALKPHOS 52 58  BILITOT 0.9 1.0  PROT 6.8 6.9  ALBUMIN 4.0 3.9   No results for input(s): LIPASE, AMYLASE in the last 168 hours. No results for input(s): AMMONIA in the last 168 hours. Coagulation Profile: Recent Labs  Lab 08/18/24 0930  INR 3.2*   Cardiac Enzymes: No results for input(s): CKTOTAL, CKMB, CKMBINDEX, TROPONINI in the last 168 hours. BNP (last 3 results) No results for input(s): PROBNP in the last 8760 hours. HbA1C: No results  for input(s): HGBA1C in the last 72 hours. CBG: No results for input(s): GLUCAP in the last 168 hours. Lipid Profile: No results for input(s): CHOL, HDL, LDLCALC, TRIG, CHOLHDL, LDLDIRECT in the last 72 hours. Thyroid  Function Tests: No results for input(s): TSH, T4TOTAL, FREET4, T3FREE, THYROIDAB in the last 72 hours. Anemia Panel: No results for input(s): VITAMINB12, FOLATE, FERRITIN, TIBC, IRON, RETICCTPCT in the last 72 hours. Urine analysis:    Component Value Date/Time   COLORURINE YELLOW 08/18/2024 1140   APPEARANCEUR CLEAR 08/18/2024 1140   APPEARANCEUR Clear 03/21/2023 0944   LABSPEC 1.014 08/18/2024 1140   PHURINE 5.0 08/18/2024 1140   GLUCOSEU NEGATIVE 08/18/2024 1140   HGBUR NEGATIVE 08/18/2024 1140   BILIRUBINUR NEGATIVE 08/18/2024 1140   BILIRUBINUR Negative 03/21/2023 0944   KETONESUR NEGATIVE 08/18/2024 1140   PROTEINUR NEGATIVE 08/18/2024 1140   UROBILINOGEN 0.2 09/08/2020 1326   UROBILINOGEN 0.2 09/24/2012 1640   NITRITE NEGATIVE 08/18/2024 1140   LEUKOCYTESUR NEGATIVE 08/18/2024 1140    Last A1C:  No results found for: HGBA1C   Radiologic Exams on Admission:   US  Venous Img Lower Bilateral Addendum Date: 08/18/2024 ADDENDUM REPORT: 08/18/2024 11:38 ADDENDUM: Critical Value/emergent results were called by telephone at the time of interpretation by Dr Duwaine Severs on August 18, 2024 to provider Dr. Freddi who verbally acknowledged these results. Electronically Signed   By: Megan  Zare M.D.   On: 08/18/2024 11:38   Result Date: 08/18/2024 CLINICAL DATA:  Chronic lower extremity edema EXAM: Bilateral LOWER EXTREMITY VENOUS DOPPLER  ULTRASOUND TECHNIQUE: Gray-scale sonography with compression, as well as color and duplex ultrasound, were performed to evaluate the deep venous system(s) from the level of the common femoral vein through the popliteal and proximal calf veins. COMPARISON:  July 26, 2024 FINDINGS: VENOUS There is a  partially occlusive thrombus within the normal-sized left popliteal vein with lack of complete compressibility. Otherwise there is normal compressibility of the common femoral, superficial femoral, and popliteal veins, as well as the remainder of the visualized calf veins. Visualized portions of profunda femoral vein and great saphenous vein unremarkable. No filling defects to suggest DVT on grayscale or color Doppler imaging within the remainder of the vessels. Doppler waveforms show normal direction of venous flow, normal respiratory plasticity and response to augmentation. OTHER Bilateral lower extremity soft tissue edema distal to the knee. Limitations: none IMPRESSION: Partially occlusive left popliteal vein thrombus with lack of complete compressibility. No DVT on the right. Soft tissue edema in bilateral lower extremity distal to the knee. Electronically Signed: By: Megan  Zare M.D. On: 08/18/2024 11:32   DG Chest Port 1 View Result Date: 08/18/2024 CLINICAL DATA:  Fever. EXAM: PORTABLE CHEST 1 VIEW COMPARISON:  December 26, 2023. FINDINGS: Stable cardiomediastinal silhouette. Stable elevated left hemidiaphragm. No definite acute pulmonary disease is noted. Bony thorax is unremarkable. IMPRESSION: No active disease. Electronically Signed   By: Lynwood Landy Raddle M.D.   On: 08/18/2024 09:36   DG Tibia/Fibula Right Result Date: 08/18/2024 CLINICAL DATA:  Right lower extremity pain and swelling for several weeks. EXAM: RIGHT TIBIA AND FIBULA - 2 VIEW COMPARISON:  None Available. FINDINGS: There is no evidence of fracture or other focal bone lesions. Soft tissues are unremarkable. IMPRESSION: Negative. Electronically Signed   By: Lynwood Landy Raddle M.D.   On: 08/18/2024 09:34    EKG:   Independently reviewed.  Orders placed or performed during the hospital encounter of 08/18/24   ED EKG   ED EKG   EKG 12-Lead   EKG 12-Lead   EKG 12-Lead    ---------------------------------------------------------------------------------------------------------------------------------------    Assessment / Plan:   Principal Problem:   Sepsis (HCC) Active Problems:   Chronic anticoagulation   Personal history of DVT (deep vein thrombosis)   Weakness   GERD   Rheumatoid arthritis (HCC)   Malignant neoplasm of prostate (HCC)   DVT (deep venous thrombosis) (HCC)   COPD with acute exacerbation (HCC)   History of COPD   History of seizure disorder   Primary osteoarthritis of both feet   Dyspnea on exertion   OSA (obstructive sleep apnea)   Class 2 severe obesity due to excess calories with serious comorbidity and body mass index (BMI) of 35.0 to 35.9 in adult (HCC)   Chronic diastolic HF (heart failure) (HCC)   Stage 3a chronic kidney disease (CKD) (HCC)   Assessment and Plan: * Sepsis (HCC) - Right lower extremity cellulitis -with open wound, negative for DVT POA: Patient meets sepsis due to cellulitis-  w leukocytosis WBC 18, lactic acid 2.3, creatinine 1.59, RR 21 -Immunocompromised -patient received Rocephin  in ED, broaden spectrum antibiotic to Zyvox  -Continue IV fluid resuscitation based on sepsis protocol -Will follow the blood cultures  Chronic anticoagulation - Chronically anticoagulated for chronic DVT on Coumadin  INR therapeutic at 3.2 -Will dose per pharmacy  Weakness - Consulting PT OT for evaluation recommendations  Personal history of DVT (deep vein thrombosis) Continue Coumadin  Left lower extremity Doppler positive for persistent chronic DVT  Stage 3a chronic kidney disease (CKD) (HCC) -  Monitoring closely, avoiding nephrotoxins, avoiding hypotension -Mildly elevated BUN/creatinine from baseline  Lab Results  Component Value Date   CREATININE 1.59 (H) 08/18/2024   CREATININE 1.52 (H) 08/17/2024   CREATININE 1.41 (H) 06/22/2024   estimated creatinine clearance is 48.2 mL/min (A) (by C-G formula based  on SCr of 1.59 mg/dL (H)).   Chronic diastolic HF (heart failure) (HCC) - Will monitor I's and O's, daily weights -Unfortunately patient needs fluid resuscitation due to meeting sepsis criteria -Will monitor closely for volume overload, resuming his home medication of 80 mg Lasix  in a.m. -Last echo March 2023 EGF 55-60% normal LV function normal RV function valves within normal limits, no LVH  Class 2 severe obesity due to excess calories with serious comorbidity and body mass index (BMI) of 35.0 to 35.9 in adult (HCC) Height 5'9, weight 99.8 kg, BMI 32.49 kg/m -Discussed with patient, advised follow-up with PCP regarding outpatient weight loss programs  OSA (obstructive sleep apnea) Monitor closely, nightly CPAP  Dyspnea on exertion - As needed DuoNeb bronchodilators -Continue home inhalers  Primary osteoarthritis of both feet - Continue current meds, With holding immunosuppressants  History of seizure disorder - Stable, continue home medications  History of COPD No signs of exacerbation, as needed DuoNeb bronchodilators, continue home dose inhalers  COPD with acute exacerbation (HCC) - Currently stable, continue inhalers as needed, supplemental oxygen  as needed to maintain O2 sat greater than 90%  DVT (deep venous thrombosis) (HCC) - Chronically on Coumadin , bilateral lower extremity Doppler studies still revealing left lower extremity DVT, right cellulitic changes -INR therapeutic at 3.2  Malignant neoplasm of prostate [facility]) - Follow-up with urologist as outpatient  Rheumatoid arthritis (HCC) Stable continue meds  GERD Continue PPI              Consults called:  None -------------------------------------------------------------------------------------------------------------------------------------------- DVT prophylaxis:  SCDs Start: 08/18/24 1210 warfarin (COUMADIN ) tablet 3 mg   Code Status:   Code Status: Full Code   Admission status: Patient  will be admitted as Inpatient, with a greater than 2 midnight length of stay. Level of care: Telemetry   Family Communication:  none at bedside  (The above findings and plan of care has been discussed with patient in detail, the patient expressed understanding and agreement of above plan)  --------------------------------------------------------------------------------------------------------------------------------------------------  Disposition Plan:  Anticipated 1-2 days Status is: Inpatient Remains inpatient appropriate because: Needing IV antibiotics, IV fluid, treatment for sepsis  ----------------------------------------------------------------------------------------------------------------------------------------  Time spent:  23  Min.  Was spent seeing and evaluating the patient, reviewing all medical records, drawn plan of care.  SIGNED: [name], MD, FHM. FAAFP. [place] [facility], Pager  (Please use [email or web address] to page/ or secure chat through epic) If 7PM-7AM, please contact night-coverage [email or web address],  08/18/2024, 12:37 PM

## 2024-08-18 NOTE — Assessment & Plan Note (Signed)
-   Chronically on Coumadin , bilateral lower extremity Doppler studies still revealing left lower extremity DVT, right cellulitic changes -INR therapeutic at 3.2

## 2024-08-18 NOTE — Assessment & Plan Note (Signed)
-   Right lower extremity cellulitis -with open wound, negative for DVT POA: Patient meets sepsis due to cellulitis-  w leukocytosis WBC 18, lactic acid 2.3, creatinine 1.59, RR 21 -Immunocompromised -patient received Rocephin  in ED, broaden spectrum antibiotic to Zyvox  -Continue IV fluid resuscitation based on sepsis protocol -Will follow the blood cultures

## 2024-08-18 NOTE — Hospital Course (Addendum)
 [name] is a 73 year old male with extensive history of rheumatoid arthritis osteoarthritis, chronic DVT on Coumadin , diastolic CHF, COPD, gout, GERD..  Presented to ED with chief complaint of right leg pain and fever.  Reported has been going on for past 2 weeks.  Chronic right lower extremity edema.  Ultrasound 2 weeks ago was negative for DVT.  Otherwise is progressively becoming more erythematous with edema. Tmax at home 100.6.  No other complaints.   ED evaluation: Blood pressure (!) 153/83, pulse 78, temperature 97.9 F (36.6 C), temperature source Oral, resp. rate 17, SpO2 96%.  Abnormal labs: WBC 18, platelets 142, creatinine 1.59, INR 3.2, Cr 2.3>> 1.6  Lower extremity Doppler study: Right soft tissue swelling, cellulitis, left: Partially occlusive left popliteal vein thrombus with lack of complete compressibilit   Patient was given broad-spectrum antibiotics Rocephin , IV fluids requested to be admitted for cellulitis

## 2024-08-18 NOTE — ED Provider Notes (Signed)
 [name] Provider Note   CSN: [id number] Arrival date & time: 08/18/24  9352     Patient presents with: Leg Pain   [name] is a 73 y.o. male.   HPI 73 year old male with a history of rheumatoid arthritis, COPD, DVT, CHF, and other comorbidities presents with right leg pain and fever.  He has chronic leg issues from blood clots, his right leg is always bigger than the left.  2 weeks ago he had a DVT ultrasound that was negative.  About a week ago he finished antibiotics for cellulitis of his right leg, he thinks he took amoxicillin .  However since yesterday he has developed acute pain in his right leg and has a new area of redness.  The leg is not more swollen than typical.  He had a temperature of 100.6 yesterday evening.  No chest pain or shortness of breath currently though he did have a bout of shortness of breath last night that responded to his neb treatment.  No new cough.  Patient also feels some knots in his left leg and is concerned about a blood clot in that leg.  Prior to Admission medications   Medication Sig Start Date End Date Taking? Authorizing Provider  acetaminophen  (TYLENOL ) 650 MG CR tablet Take 1,300 mg by mouth every 8 (eight) hours as needed for pain.    [provider]  albuterol  (PROVENTIL ) (2.5 MG/3ML) 0.083% nebulizer solution Take 3 mLs (2.5 mg total) by nebulization every 2 (two) hours as needed for wheezing or shortness of breath. 01/25/23   Pearlean Manus, MD  allopurinol  (ZYLOPRIM ) 100 MG tablet Take 1 tablet (100 mg total) by mouth 2 (two) times daily. 02/12/23   Cheryl Waddell HERO, PA-C  ascorbic acid (VITAMIN C) 500 MG tablet Take 500 mg by mouth daily.    [provider]  Budeson-Glycopyrrol-Formoterol  (BREZTRI  AEROSPHERE) 160-9-4.8 MCG/ACT AERO Inhale 2 puffs into the lungs in the morning. 02/06/24   Geronimo Amel, MD  cholecalciferol (VITAMIN D3) 25 MCG (1000 UNIT) tablet Take  1,000 Units by mouth daily.    [provider]  diclofenac  Sodium (VOLTAREN ) 1 % GEL Apply 2-4 grams to affected joint 4 times daily as needed. Patient taking differently: Apply 2-4 grams to affected joint 4 times daily as needed for pain 03/02/20   Cheryl Waddell HERO, PA-C  dicyclomine  (BENTYL ) 10 MG capsule TAKE 1 CAPSULE BY MOUTH 4 TIMES DAILY BEFORE MEALS AND AT BEDTIME Patient taking differently: Take 20 mg by mouth 2 (two) times daily. 08/11/17   Aneita Gwendlyn DASEN, MD  fluticasone  furoate-vilanterol (BREO ELLIPTA ) 100-25 MCG/ACT AEPB USE 1 INHALATION ORALLY EVERY DAY Patient not taking: Reported on 04/01/2024 02/06/24   Geronimo Amel, MD  furosemide  (LASIX ) 80 MG tablet TAKE 1 TABLET BY MOUTH EVERY DAY 08/19/22   Croitoru, Mihai, MD  guaiFENesin -codeine  100-10 MG/5ML syrup Take 10 mLs by mouth every 6 (six) hours as needed for cough. Patient not taking: Reported on 04/01/2024 12/27/23   Geroldine Berg, MD  leflunomide  (ARAVA ) 20 MG tablet TAKE 1 TABLET BY MOUTH EVERY DAY 03/15/24   Cheryl Waddell HERO, PA-C  levocetirizine (XYZAL) 5 MG tablet Take 5 mg by mouth every evening.    [provider]  mupirocin  ointment (BACTROBAN ) 2 %  06/02/23   [provider]  omeprazole  (PRILOSEC) 40 MG capsule Take 1 capsule (40 mg total) by mouth daily. 01/25/23   Pearlean Manus, MD  oxybutynin  (DITROPAN -XL) 10 MG 24  hr tablet Take 1 tablet (10 mg total) by mouth daily. 03/15/22   McKenzie, Belvie CROME, MD  phenytoin  (DILANTIN ) 100 MG ER capsule Take 100-200 mg by mouth See admin instructions. Take one capsule in the morning and 2 capsules at bedtime    [provider]  Potassium Chloride  ER 20 MEQ TBCR Take 20 mEq by mouth daily. 02/24/19   [provider]  rosuvastatin  (CRESTOR ) 10 MG tablet Take 10 mg by mouth daily. 06/14/20   [provider]  spironolactone  (ALDACTONE ) 50 MG tablet TAKE 1 TABLET BY MOUTH EVERY DAY 05/03/24   Croitoru, Mihai, MD  Tocilizumab  (ACTEMRA  IV)  Inject into the vein every 28 (twenty-eight) days.    [provider]  Vibegron  (GEMTESA ) 75 MG TABS Take 1 tablet (75 mg total) by mouth daily. Patient not taking: Reported on 04/01/2024 03/21/23   Sherrilee Belvie CROME, MD  warfarin (COUMADIN ) 3 MG tablet Take 1 tablet (3 mg total) by mouth at bedtime. Restart on 3/24 (48 hours after surgery) 03/22/21   Gaston Hamilton, MD    Allergies: Orencia  [abatacept ], Carbamazepine, Celecoxib, Cephalexin, Enbrel [etanercept], Humira [adalimumab], Levofloxacin , Sulfa antibiotics, and Sulfasalazine    Review of Systems  Constitutional:  Positive for fever.  Respiratory:  Negative for cough.   Cardiovascular:  Negative for chest pain.  Genitourinary:  Negative for dysuria.  Skin:  Positive for color change.    Updated Vital Signs BP (!) 166/81   Pulse 81   Temp 98.4 F (36.9 C) (Oral)   Resp 19   SpO2 96%   Physical Exam Vitals and nursing note reviewed.  Constitutional:      General: He is not in acute distress.    Appearance: He is well-developed. He is not ill-appearing or diaphoretic.  HENT:     Head: Normocephalic and atraumatic.  Cardiovascular:     Rate and Rhythm: Normal rate and regular rhythm.     Pulses:          Dorsalis pedis pulses are 2+ on the right side.  Pulmonary:     Effort: Pulmonary effort is normal.  Abdominal:     General: There is no distension.  Musculoskeletal:     Comments: Patient has 2 small wounds to the distal lateral aspect of his right lower leg.  The superior 1 is draining some serous type fluid.  To his right lateral proximal lower leg he has a patch of erythema that is warm and tender to the touch. On his left lower leg, I do feel at least one of the knots that he is describing that feels like a subcutaneous nodule.  Skin:    General: Skin is warm and dry.  Neurological:     Mental Status: He is alert.     (all labs ordered are listed, but only abnormal results are displayed) Labs  Reviewed  LACTIC ACID, PLASMA - Abnormal; Notable for the following components:      Result Value   Lactic Acid, Venous 2.3 (*)    All other components within normal limits  COMPREHENSIVE METABOLIC PANEL WITH GFR - Abnormal; Notable for the following components:   Glucose, Bld 121 (*)    Creatinine, Ser 1.59 (*)    GFR, Estimated 46 (*)    All other components within normal limits  CBC WITH DIFFERENTIAL/PLATELET - Abnormal; Notable for the following components:   WBC 18.0 (*)    Platelets 142 (*)    Neutro Abs 16.0 (*)    Abs  Immature Granulocytes 0.08 (*)    All other components within normal limits  PROTIME-INR - Abnormal; Notable for the following components:   Prothrombin Time 34.6 (*)    INR 3.2 (*)    All other components within normal limits  CULTURE, BLOOD (ROUTINE X 2)  CULTURE, BLOOD (ROUTINE X 2)  LACTIC ACID, PLASMA  URINALYSIS, W/ REFLEX TO CULTURE (INFECTION SUSPECTED)    EKG: EKG Interpretation Date/Time:  Wednesday August 18 2024 10:25:49 EDT Ventricular Rate:  75 PR Interval:  166 QRS Duration:  97 QT Interval:  378 QTC Calculation: 423 R Axis:   62  Text Interpretation: Sinus rhythm Atrial premature complexes Abnormal R-wave progression, early transition Minimal ST depression, anterior leads similar to nov 2024 Confirmed by Freddi Hamilton [phone]) on 08/18/2024 10:54:16 AM  Radiology: [facility] Chest Port 1 View Result Date: 08/18/2024 CLINICAL DATA:  Fever. EXAM: PORTABLE CHEST 1 VIEW COMPARISON:  December 26, 2023. FINDINGS: Stable cardiomediastinal silhouette. Stable elevated left hemidiaphragm. No definite acute pulmonary disease is noted. Bony thorax is unremarkable. IMPRESSION: No active disease. Electronically Signed   By: Lynwood Landy Raddle M.D.   On: 08/18/2024 09:36   DG Tibia/Fibula Right Result Date: 08/18/2024 CLINICAL DATA:  Right lower extremity pain and swelling for several weeks. EXAM: RIGHT TIBIA AND FIBULA - 2 VIEW COMPARISON:  None Available.  FINDINGS: There is no evidence of fracture or other focal bone lesions. Soft tissues are unremarkable. IMPRESSION: Negative. Electronically Signed   By: Lynwood Landy Raddle M.D.   On: 08/18/2024 09:34     .Critical Care  Performed by: Freddi Hamilton, MD Authorized by: Freddi Hamilton, MD   Critical care provider statement:    Critical care time (minutes):  30   Critical care time was exclusive of:  Separately billable procedures and treating other patients   Critical care was necessary to treat or prevent imminent or life-threatening deterioration of the following conditions:  Sepsis   Critical care was time spent personally by me on the following activities:  Development of treatment plan with patient or surrogate, discussions with consultants, evaluation of patient's response to treatment, examination of patient, ordering and review of laboratory studies, ordering and review of radiographic studies, ordering and performing treatments and interventions, pulse oximetry, re-evaluation of patient's condition and review of old charts    Medications Ordered in the ED  cefTRIAXone  (ROCEPHIN ) 2 g in sodium chloride  0.9 % 100 mL IVPB (2 g Intravenous New Bag/Given 08/18/24 1029)  sodium chloride  0.9 % bolus 1,000 mL (1,000 mLs Intravenous New Bag/Given 08/18/24 1026)                                    Medical Decision Making Amount and/or Complexity of Data Reviewed Independent Historian: spouse External Data Reviewed: notes. Labs: ordered.    Details: Leukocytosis and lactate of 2.3 Radiology: ordered and independent interpretation performed.    Details: No free air in the right lower extremity.  No CHF ECG/medicine tests: ordered and independent interpretation performed.    Details: No change from baseline  Risk Decision regarding hospitalization.   Patient is immunocompromise.  While he is afebrile here, septic workup obtained given history.  White blood cell count is elevated at 18 and  he has a lactic acidosis.  Given fluids and started on IV Rocephin .  Low clinical suspicion for deep space infection or necrotizing fasciitis.  Interestingly, the left leg has a  popliteal DVT.  Discussed this with the hospitalist, Dr. Willette.  He did have transient dyspnea last night attributed to his COPD, but given no dyspnea now I think PE is unlikely.  Will admit to the hospitalist service.     Final diagnoses:  Sepsis due to cellulitis [facility])    ED Discharge Orders     None          Freddi Hamilton, MD 08/18/24 1300

## 2024-08-18 NOTE — Assessment & Plan Note (Signed)
 Stable, continue home medications

## 2024-08-18 NOTE — Assessment & Plan Note (Signed)
Stable continue meds

## 2024-08-18 NOTE — Assessment & Plan Note (Signed)
-   Consulting PT/OT for evaluation recommendations

## 2024-08-18 NOTE — Sepsis Progress Note (Signed)
 eLink is following this Code Sepsis.

## 2024-08-18 NOTE — ED Notes (Signed)
 US  at bedside.

## 2024-08-18 NOTE — Assessment & Plan Note (Signed)
 Continue PPI.

## 2024-08-18 NOTE — Assessment & Plan Note (Signed)
 Height 5'9, weight 99.8 kg, BMI 32.49 kg/m -Discussed with patient, advised follow-up with PCP regarding outpatient weight loss programs

## 2024-08-18 NOTE — Plan of Care (Signed)
  Problem: Clinical Measurements: Goal: Diagnostic test results will improve Outcome: Progressing   Problem: Education: Goal: Knowledge of General Education information will improve Description: Including pain rating scale, medication(s)/side effects and non-pharmacologic comfort measures Outcome: Progressing   

## 2024-08-18 NOTE — Assessment & Plan Note (Signed)
 Continue Coumadin  Left lower extremity Doppler positive for persistent chronic DVT

## 2024-08-18 NOTE — TOC CM/SW Note (Signed)
 Transition of Care [facility]) - Inpatient Brief Assessment   Patient Details  Name: [name] MRN: [id number] Date of Birth: [birth year]  Transition of Care [facility]) CM/SW Contact:    Lucie Lunger, LCSWA Phone Number: 08/18/2024, 3:09 PM   Clinical Narrative: Transition of Care Department [facility]) has reviewed patient and no TOC needs have been identified at this time. We will continue to monitor patient advancement through interdiciplinary progression rounds. If new patient transition needs arise, please place a TOC consult.  Transition of Care Asessment: Insurance and Status: Insurance coverage has been reviewed Patient has primary care physician: Yes Home environment has been reviewed: From home Prior level of function:: Independent Prior/Current Home Services: No current home services Social Drivers of Health Review: SDOH reviewed no interventions necessary Readmission risk has been reviewed: Yes Transition of care needs: no transition of care needs at this time

## 2024-08-18 NOTE — ED Triage Notes (Signed)
 Pt c/o low grade fever x last night, states it the highest was 100.6 F. Also states R leg pain and swelling in which he a couple weeks ago he had checked for blood clots and he did not have any but states PCP went up on his blood thinner. L LE does appear discolored, red, and swollen, PCP placed him on ABX for this and he completed full course about a week ago. Rates pain 6/10. Pt ambulatory to triage room. Pt also endorses feeling overall unwell and body aches all over. Endorses shob, states he took a breathing Tx lastnight and did feel better afterwards--Hx of COPD.

## 2024-08-18 NOTE — Assessment & Plan Note (Signed)
-   Continue current meds, With holding immunosuppressants

## 2024-08-18 NOTE — Progress Notes (Signed)
 PHARMACY - ANTICOAGULATION CONSULT NOTE  Pharmacy Consult for Warfarin Indication: History of DVT  Allergies  Allergen Reactions   Orencia  [Abatacept ] Anaphylaxis    Had prostate cancer   Carbamazepine Rash    Makes drowsy Brand name is Tegretol    Celecoxib Itching and Rash    Brand name is Celebrex   Cephalexin Rash    Severe Rash. Brand name is Keflex Patient has tolerated ceftriaxone  05/2019   Enbrel [Etanercept] Rash   Humira [Adalimumab] Rash   Levofloxacin  Other (See Comments)    GI Intolerance Brand name is Levaquin    Sulfa Antibiotics Rash   Sulfasalazine Rash    Patient Measurements:  Wt: 99.8 kg (220 lb) Ht: 5' 9 (175.3 cm)  Vital Signs: Temp: 98.6 F (37 C) (08/20 1312) Temp Source: Oral (08/20 1312) BP: 147/77 (08/20 1312) Pulse Rate: 80 (08/20 1312)  Labs: Recent Labs    08/17/24 1058 08/18/24 0930  HGB 14.9 14.3  HCT 45.1 43.4  PLT 152 142*  LABPROT  --  34.6*  INR  --  3.2*  CREATININE 1.52* 1.59*    Estimated Creatinine Clearance: 48.2 mL/min (A) (by C-G formula based on SCr of 1.59 mg/dL (H)).   Medical History: Past Medical History:  Diagnosis Date   Arthritis    RA   Asthma    CHF (congestive heart failure) (HCC)    pt denies    Clotting disorder (HCC)    DVT both legs    COPD (chronic obstructive pulmonary disease) (HCC)    DDD (degenerative disc disease), cervical    with UE's paresthesias   Diverticulosis    DVT, lower extremity (HCC)    bilat   Eye abnormality    right eye drifts has difficulty focusing 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 Unknown origin.   Shortness  of breath dyspnea    exertion    Sleep apnea    cpap    Tubular adenoma of colon 04/2008    Medications:   PTA pt taking warfarin (3 mg) at bedtime.   Assessment:  Pt is a 28 YOM  with PMH of HF, COPD, DVT. He presented to ED with c/o fever and right leg pain and swelling. Pt has chronic leg issues due to history of DVT and had a DVT ultrasound 2 weeks ago that was negative. Last week he completed course of abx for cellulitis of the right leg. Yesterday pt developed new acute pain in his right leg along with swelling and redness. Pt is currently taking warfarin. Pharmacy was consulted for warfarin dosing due to history of DVT.   Goal of Therapy:  INR 2-3 Monitor platelets by anticoagulation protocol: Yes   Plan:  Hold warfarin dose 8/20 due to INR of 3.2 (goal INR 2.0-3.0) H&H WNL, platelets 142.  Continue to monitor INR daily   Veleria Ahle- PharmD Student 08/18/2024,2:41 PM

## 2024-08-18 NOTE — Assessment & Plan Note (Signed)
-   Chronically anticoagulated for chronic DVT on Coumadin  INR therapeutic at 3.2 -Will dose per pharmacy

## 2024-08-18 NOTE — ED Notes (Signed)
 See triage notes. Ble discoloration and swelling with right worse than left. But states left has less swelling than usual. Had shob last night per pt but better after taking neb. Pt still has some auditory wheezing and labored breathing with activity. A/o. Mild warmth noted to right lateral leg with two open wounds noted with one with clear drainage.

## 2024-08-18 NOTE — Assessment & Plan Note (Signed)
 No signs of exacerbation, as needed DuoNeb bronchodilators, continue home dose inhalers

## 2024-08-18 NOTE — Assessment & Plan Note (Signed)
 Monitor closely, nightly CPAP

## 2024-08-18 NOTE — ED Notes (Signed)
 Pt states he took tylenol  around 9 pm last night but has not taken anything today, temperature in triage 98.4 F

## 2024-08-18 NOTE — Progress Notes (Signed)
 Put water  in patient's home CPAP unit, which is at bedside.  Patient self manages and made sure that cord was in good working condition and plugged into red outlet.

## 2024-08-18 NOTE — Assessment & Plan Note (Signed)
-   As needed DuoNeb bronchodilators -Continue home inhalers

## 2024-08-18 NOTE — Assessment & Plan Note (Signed)
-   Monitoring closely, avoiding nephrotoxins, avoiding hypotension -Mildly elevated BUN/creatinine from baseline  Lab Results  Component Value Date   CREATININE 1.59 (H) 08/18/2024   CREATININE 1.52 (H) 08/17/2024   CREATININE 1.41 (H) 06/22/2024   estimated creatinine clearance is 48.2 mL/min (A) (by C-G formula based on SCr of 1.59 mg/dL (H)).

## 2024-08-19 ENCOUNTER — Inpatient Hospital Stay (HOSPITAL_COMMUNITY)

## 2024-08-19 DIAGNOSIS — L03119 Cellulitis of unspecified part of limb: Secondary | ICD-10-CM

## 2024-08-19 DIAGNOSIS — A419 Sepsis, unspecified organism: Secondary | ICD-10-CM | POA: Diagnosis not present

## 2024-08-19 DIAGNOSIS — E11628 Type 2 diabetes mellitus with other skin complications: Secondary | ICD-10-CM | POA: Diagnosis not present

## 2024-08-19 DIAGNOSIS — R7881 Bacteremia: Secondary | ICD-10-CM

## 2024-08-19 LAB — BLOOD CULTURE ID PANEL (REFLEXED) - BCID2

## 2024-08-19 LAB — ECHOCARDIOGRAM COMPLETE
Area-P 1/2: 4.83 cm2
Calc EF: 84.1 %
Single Plane A2C EF: 83.3 %
Single Plane A4C EF: 83.3 %
Weight: 3668.45 [oz_av]

## 2024-08-19 LAB — BASIC METABOLIC PANEL WITH GFR
Anion gap: 9 (ref 5–15)
BUN: 14 mg/dL (ref 8–23)
CO2: 26 mmol/L (ref 22–32)
Calcium: 8.6 mg/dL — ABNORMAL LOW (ref 8.9–10.3)
Chloride: 105 mmol/L (ref 98–111)
Creatinine, Ser: 1.39 mg/dL — ABNORMAL HIGH (ref 0.61–1.24)
GFR, Estimated: 54 mL/min — ABNORMAL LOW (ref >60)
Glucose, Bld: 100 mg/dL — ABNORMAL HIGH (ref 70–99)
Potassium: 3.6 mmol/L (ref 3.5–5.1)
Sodium: 140 mmol/L (ref 135–145)

## 2024-08-19 LAB — APTT: aPTT: 44 s — ABNORMAL HIGH (ref 24–36)

## 2024-08-19 LAB — CBC
HCT: 43.1 % (ref 39.0–52.0)
Hemoglobin: 13.9 g/dL (ref 13.0–17.0)
MCH: 29.4 pg (ref 26.0–34.0)
MCHC: 32.3 g/dL (ref 30.0–36.0)
MCV: 91.3 fL (ref 80.0–100.0)
Platelets: 129 K/uL — ABNORMAL LOW (ref 150–400)
RBC: 4.72 MIL/uL (ref 4.22–5.81)
RDW: 15.6 % — ABNORMAL HIGH (ref 11.5–15.5)
WBC: 11.5 K/uL — ABNORMAL HIGH (ref 4.0–10.5)
nRBC: 0 % (ref 0.0–0.2)

## 2024-08-19 LAB — PROTIME-INR
INR: 3.5 — ABNORMAL HIGH (ref 0.8–1.2)
Prothrombin Time: 36.4 s — ABNORMAL HIGH (ref 11.4–15.2)

## 2024-08-19 LAB — GLUCOSE, CAPILLARY: Glucose-Capillary: 104 mg/dL — ABNORMAL HIGH (ref 70–99)

## 2024-08-19 MED ORDER — DAPTOMYCIN-SODIUM CHLORIDE 700-0.9 MG/100ML-% IV SOLN
8.0000 mg/kg | Freq: Every day | INTRAVENOUS | Status: DC
  Administered 2024-08-19 – 2024-08-23 (×5): 700 mg via INTRAVENOUS
  Filled 2024-08-19 (×7): qty 100

## 2024-08-19 MED ORDER — VANCOMYCIN HCL 1250 MG/250ML IV SOLN: 1250.0000 mg | INTRAVENOUS | Status: DC

## 2024-08-19 MED ORDER — CLINDAMYCIN HCL 300 MG PO CAPS: 300.0000 mg | ORAL_CAPSULE | Freq: Three times a day (TID) | ORAL | 0 refills | Status: DC

## 2024-08-19 MED ORDER — VANCOMYCIN HCL 2000 MG/400ML IV SOLN
2000.0000 mg | Freq: Once | INTRAVENOUS | Status: DC
  Filled 2024-08-19: qty 400

## 2024-08-19 MED ORDER — LACTINEX PO CHEW: 1.0000 | CHEWABLE_TABLET | Freq: Three times a day (TID) | ORAL | 0 refills | Status: DC

## 2024-08-19 MED ORDER — PERFLUTREN LIPID MICROSPHERE
1.0000 mL | INTRAVENOUS | Status: AC | PRN
  Administered 2024-08-19: 1.5 mL via INTRAVENOUS

## 2024-08-19 NOTE — TOC Initial Note (Signed)
 Transition of Care [facility]) - Initial/Assessment Note    Patient Details  Name: [name] MRN: [id number] Date of Birth: [birth year]  [facility]) CM/SW Contact:    Nena LITTIE Coffee, RN Phone Number: 08/19/2024, 12:09 PM  Clinical Narrative:                 Pt assessed for high readmission score. Admitted c/sepsis, from home c/wife, previously independent, pt and wife both drive. Has a CPAP from unknown provider, no other DME. No TOC needs at this time.   Expected Discharge Plan: Home/Self Care Barriers to Discharge: Continued Medical Work up   Patient Goals and CMS Choice Patient states their goals for this hospitalization and ongoing recovery are:: Return home          Expected Discharge Plan and Services In-house Referral: Clinical Social Work Discharge Planning Services: CM Consult   Living arrangements for the past 2 months: Single Family Home Expected Discharge Date: 08/20/24                                    Prior Living Arrangements/Services Living arrangements for the past 2 months: Single Family Home Lives with:: Spouse Patient language and need for interpreter reviewed:: Yes        Need for Family Participation in Patient Care: Yes (Comment) Care giver support system in place?: Yes (comment)   Criminal Activity/Legal Involvement Pertinent to Current Situation/Hospitalization: No - Comment as needed  Activities of Daily Living   ADL Screening (condition at time of admission) Independently performs ADLs?: Yes (appropriate for developmental age) Is the patient deaf or have difficulty hearing?: No Does the patient have difficulty seeing, even when wearing glasses/contacts?: No Does the patient have difficulty concentrating, remembering, or making decisions?: No  Permission Sought/Granted                  Emotional Assessment Appearance:: Appears stated age Attitude/Demeanor/Rapport: Engaged Affect (typically observed): Calm,  Appropriate Orientation: : Oriented to Self, Oriented to Place, Oriented to  Time, Oriented to Situation, Fluctuating Orientation (Suspected and/or reported Sundowners) Alcohol / Substance Use: Not Applicable Psych Involvement: No (comment)  Admission diagnosis:  Sepsis (HCC) [A41.9] Sepsis due to cellulitis (HCC) [L03.90, A41.9] Patient Active Problem List   Diagnosis Date Noted   Sepsis (HCC) 08/18/2024   Acute respiratory failure with hypoxia (HCC) 01/23/2023   Stage 3a chronic kidney disease (CKD) (HCC) 03/11/2022   Bronchiectasis without complication (HCC) 03/26/2021   Incontinence when straining, male 03/20/2021   Erectile dysfunction after radical prostatectomy 09/08/2020   OAB (overactive bladder) 02/23/2020   Class 2 severe obesity due to excess calories with serious comorbidity and body mass index (BMI) of 35.0 to 35.9 in adult [facility])    Chronic diastolic HF (heart failure) (HCC)    OSA (obstructive sleep apnea) 03/10/2019   Dyspnea on exertion 10/22/2017   History of seizure disorder 02/27/2017   Primary osteoarthritis of both hands 02/27/2017   Primary osteoarthritis of both feet 02/27/2017   Idiopathic gout of multiple sites 02/27/2017   History of prostate cancer 12/29/2016   Primary osteoarthritis of both knees 12/29/2016   Chronic idiopathic gout involving toe without tophus 12/29/2016   History of COPD 12/29/2016   Plantar pustular psoriasis 12/29/2016   DVT (deep venous thrombosis) (HCC) 10/31/2016   COPD with acute exacerbation (HCC) 10/31/2016   CHF (congestive heart failure) (HCC) 10/31/2016  Prostate cancer (HCC) 08/30/2015   Malignant neoplasm of prostate (HCC) 07/04/2015   Chest pain at rest 07/05/2014   Weakness 07/05/2014   Complicated postphlebitic syndrome 11/16/2013   Personal history of DVT (deep vein thrombosis) 05/18/2013   Chronic anticoagulation 05/18/2013   Diverticulosis of colon without hemorrhage 05/18/2013   Rheumatoid arthritis (HCC)  05/18/2013   Seizure disorder (HCC) 05/18/2013   History of colonic polyps 05/18/2013   GERD 05/08/2010   PCP:  Marvine Rush, MD Pharmacy:   [phone] [place], [place] - [address] AT [facility] [address] [place] [address] Phone: [phone] Fax: [phone]  [facility] [place], [place] - [address] AT [facility] to [facility] [address] [place] [address] Phone: [phone] Fax: [phone]  [facility] [address] [place] - [address] [place] [address] Phone: [phone] Fax: [phone]     Social Drivers of Health (SDOH) Social History: SDOH Screenings   Food Insecurity: No Food Insecurity (08/19/2024)  Housing: Unknown (08/19/2024)  Transportation Needs: No Transportation Needs (08/19/2024)  Utilities: Not At Risk (08/19/2024)  Depression (PHQ2-9): Low Risk  (06/14/2019)  Financial Resource Strain: Low Risk  (12/16/2022)  Physical Activity: Inactive (11/16/2019)  Social Connections: Moderately Integrated (08/19/2024)  Stress: No Stress Concern Present (11/16/2019)  Tobacco Use: Medium Risk (04/01/2024)   SDOH Interventions:     Readmission Risk Interventions    08/19/2024   12:00 PM 01/24/2023    8:16 AM  Readmission Risk Prevention Plan  Transportation Screening Complete Complete  PCP or Specialist Appt within 3-5 Days Complete   HRI or Home Care Consult Complete Complete  Social Work Consult for Recovery Care Planning/Counseling Complete Complete  Palliative Care Screening Not Applicable Not Applicable  Medication Review Oceanographer) Complete Complete

## 2024-08-19 NOTE — Progress Notes (Signed)
 Pharmacy Antibiotic Note  [name] is a 73 y.o. male admitted on 08/18/2024 with bacteremia.  Pharmacy has been consulted for Vancomycin  dosing.  Plan: Vancomycin  2000mg  IV x 1 dose  Vancomycin  1250 mg IV Q 24 hrs. Goal AUC 400-550. Expected AUC: 550 SCr used: 1.39 mg/dL    Weight: 895 kg (770 lb 4.5 oz)  Temp (24hrs), Avg:98.1 F (36.7 C), Min:98 F (36.7 C), Max:98.2 F (36.8 C)  Recent Labs  Lab 08/17/24 1058 08/18/24 0930 08/18/24 1130 08/18/24 1237 08/18/24 1417 08/19/24 0415  WBC 8.0 18.0*  --   --   --  11.5*  CREATININE 1.52* 1.59*  --   --   --  1.39*  LATICACIDVEN  --  2.3* 1.6 1.1 1.7  --     Estimated Creatinine Clearance: 56.2 mL/min (A) (by C-G formula based on SCr of 1.39 mg/dL (H)).    Allergies  Allergen Reactions   Orencia  [Abatacept ] Anaphylaxis    Had prostate cancer   Carbamazepine Rash    Makes drowsy Brand name is Tegretol    Celecoxib Itching and Rash    Brand name is Celebrex   Cephalexin Rash    Severe Rash. Brand name is Keflex Patient has tolerated ceftriaxone  05/2019   Enbrel [Etanercept] Rash   Humira [Adalimumab] Rash   Levofloxacin  Other (See Comments)    GI Intolerance Brand name is Levaquin    Sulfa Antibiotics Rash   Sulfasalazine Rash    Antimicrobials this admission: Ceftriaxone  >> 8/20 Linezolid  8/20>>8/21 Vancomycin  8/21>>   Microbiology results: 8/20 BCx: Gram positive cocci BCID: MRSA 8/21 Bcx: pending   Thank you for allowing pharmacy to be a part of this patient's care.  Veleria Ahle- PharmD Student 08/19/2024 2:44 PM

## 2024-08-19 NOTE — Progress Notes (Signed)
 PHARMACY - ANTICOAGULATION CONSULT NOTE  Pharmacy Consult for Warfarin Indication: History of DVT  Allergies  Allergen Reactions   Orencia  [Abatacept ] Anaphylaxis    Had prostate cancer   Carbamazepine Rash    Makes drowsy Brand name is Tegretol    Celecoxib Itching and Rash    Brand name is Celebrex   Cephalexin Rash    Severe Rash. Brand name is Keflex Patient has tolerated ceftriaxone  05/2019   Enbrel [Etanercept] Rash   Humira [Adalimumab] Rash   Levofloxacin  Other (See Comments)    GI Intolerance Brand name is Levaquin    Sulfa Antibiotics Rash   Sulfasalazine Rash    Patient Measurements:  Wt: 99.8 kg (220 lb) Ht: 5' 9 (175.3 cm)  Vital Signs: Temp: 98 F (36.7 C) (08/21 0429) Temp Source: Axillary (08/21 0429) BP: 161/82 (08/21 0429) Pulse Rate: 68 (08/21 0429)  Labs: Recent Labs    08/17/24 1058 08/18/24 0930 08/19/24 0415  HGB 14.9 14.3 13.9  HCT 45.1 43.4 43.1  PLT 152 142* 129*  APTT  --   --  44*  LABPROT  --  34.6* 36.4*  INR  --  3.2* 3.5*  CREATININE 1.52* 1.59* 1.39*    Estimated Creatinine Clearance: 56.2 mL/min (A) (by C-G formula based on SCr of 1.39 mg/dL (H)).   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 Unknown origin.   Shortness of breath dyspnea    exertion    Sleep apnea    cpap    Tubular adenoma of colon 04/2008    Medications:   PTA pt taking warfarin (3 mg) at bedtime.   Assessment:  Pt is a 72 YOM  with PMH of HF, COPD, DVT. He presented to ED with c/o fever and right leg pain and swelling. Pt has chronic leg issues due to history of DVT and had a DVT ultrasound 2 weeks ago that was negative. Last week he completed course of abx for cellulitis of the right leg. Yesterday pt developed new acute pain in his right leg along with swelling and redness. Pt is currently taking warfarin. Pharmacy was consulted for warfarin dosing due to history of DVT.   INR 3.2 > 3.5  Goal of Therapy:  INR 2-3 Monitor platelets by anticoagulation protocol: Yes   Plan:  Hold warfarin dose x 1 dose Continue to monitor INR daily   Elspeth Sour, PharmD Clinical Pharmacist 08/19/2024 8:49 AM

## 2024-08-19 NOTE — Consult Note (Incomplete)
 [facility]                                                                                        Patient Identification: Patient Name: [name] MRN: [id number] Admit Date: 08/18/2024  8:18 AM Today's Date: 08/20/2024 Reason for consult: staph bacteremia  Requesting provider: Dr Willette  Principal Problem:   Sepsis [facility]) Active Problems:   GERD   Personal history of DVT (deep vein thrombosis)   Chronic anticoagulation   Rheumatoid arthritis (HCC)   Weakness   Malignant neoplasm of prostate (HCC)   DVT (deep venous thrombosis) (HCC)   COPD with acute exacerbation (HCC)   History of COPD   History of seizure disorder   Primary osteoarthritis of both feet   Dyspnea on exertion   OSA (obstructive sleep apnea)   Class 2 severe obesity due to excess calories with serious comorbidity and body mass index (BMI) of 35.0 to 35.9 in adult (HCC)   Chronic diastolic HF (heart failure) (HCC)   Stage 3a chronic kidney disease (CKD) (HCC)   Location: Patient: [name]  Provider: [facility]  Antibiotics:  Linezolid  8/20- Ceftriaxone  8/20-  Lines/Hardware:  Assessment # MRSA bacteremia 2/2 below  - 8/21 TTE ( poor study) no evidence of vegetation or endocarditis - Denies any joint pain or back pain -Denies any known hardware  # RT leg cellulitis  - does not appear to have abscess, nec fasc  # AKI on CKD - Cr improving   # Hypokalemia - to be repleted # Thrombocytopenia-in setting of infection  # RA - on leflunomide , to be hold due to infection  Recommendations  - continue daptomycin  - monitor CBC, CMP and CPK - fu repeat blood cultures for clearance.  Hold off on central line as long as hemodynamics allow - needs TEE to exclude endocarditis   - monitor for metastatic sites of infection, improvement in exam of right leg - Keep legs elevated - needs contact precautions D/w  primary team  ID will not see this weekend, Please reach out if any active questions or concerns.   Rest of the management as per the primary team. Please call with questions or concerns.  Thank you for the consult  __________________________________________________________________________________________________________ HPI and Hospital Course: 73 year old male with prior history of RA on leflunomide , CHF, asthma/COPD, DVT of lower extremities with chronic RLE edema, GERD, Gout, HLD, IBS, prostate cancer s/p radical prostatectomy with pelvic lymphadenectomy, Seizures, OSA, Serratia bacteremia in June 2020 who presented to the ED on 8/20 with fevers, body aches as well as right leg pain/redness/swelling for 1  day including SOB.  He had negative ultrasound for DVT of rt leg on 7/28. Seen by PCP and completed a course of Augmentin ? for possible RT leg cellulitis before admission.    AT ED afebrile Labs remarkable for cr elevated at 1.59, LA 2.3, WBC elevated at 18, plts 142, INR 3.2  UA normal  8/20 blood cx 1/4 bottles with GPC, BCID as MRSA  Chest xray and  Xray Rt leg negative   Venous US : Partially occlusive left popliteal vein thrombus with lack  of complete compressibility. No DVT on the right.  Soft tissue edema in bilateral lower extremity distal to the knee.  ROS: General- Denies fever, chills, loss of appetite and loss of weight HEENT - Denies headache, blurry vision, neck pain, sinus pain Chest - Denies any chest pain, cough, chronic SOB due to COPD CVS- Denies any dizziness/lightheadedness, syncopal attacks, palpitations Abdomen- Denies any nausea, vomiting, abdominal pain, hematochezia and diarrhea Neuro - Denies any weakness, numbness, tingling sensation Psych - Denies any changes in mood irritability or depressive symptoms GU- Denies any burning, dysuria, hematuria or increased frequency of urination Skin - denies any rashes/lesions MSK - denies any joint pain/swelling or  restricted ROM. 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 Unknown origin.   Shortness of breath dyspnea    exertion    Sleep apnea    cpap    Tubular adenoma of colon 04/2008   Past Surgical History:  Procedure Laterality Date   CHOLECYSTECTOMY     CIRCUMCISION     COLONOSCOPY     CYSTOSCOPY WITH LITHOLAPAXY N/A 03/17/2019   Procedure: CYSTOSCOPY WITH LITHOLAPAXY AND REMOVAL OF FOREIGN BODY;  Surgeon: Sherrilee Belvie CROME, MD;  Location: [facility];  Service: Urology;  Laterality: N/A;   DOPPLER ECHOCARDIOGRAPHY N/A 01-21-2012   TECHNICALLY DIFFICULT. MILD CONCENTRIC LV HYPERTROPHY. LV CAVITY IS SMALL.. EF=> 55%. TRANSMITRAL SPECTRAL FLOW PATTREN IS SUGGESTIVE OF IMPAIRED LV RELAXATION. RV SYSTOLIC PRESSURE IS . LEFT ATRIAL SIZE IS NORMAL. AV APPEARS MILDLY SCLEROTIC. NO SIGN VALVE DISEASE NOTED.   LEFT HEART CATH AND CORONARY ANGIOGRAPHY N/A 03/12/2022   Procedure: LEFT HEART CATH AND CORONARY ANGIOGRAPHY;  Surgeon: Wonda Sharper, MD;  Location: [facility] INVASIVE CV LAB;  Service: Cardiovascular;  Laterality: N/A;   LYMPHADENECTOMY Bilateral 08/30/2015   Procedure: PELVIC LYMPHADENECTOMY;  Surgeon: Belvie CROME Sherrilee, MD;   Location: [facility];  Service: Urology;  Laterality: Bilateral;   NUCLEAR STRESS TEST N/A 01-21-2012   NORMAL PATTERN OF PERFUSION IN ALL REGIONS. POST STRESS LV SIZE IS NORMAL. NO EVIDENCE OF INDUCIBLE ISCHEMIA. EF 54%.   POLYPECTOMY     PROSTATE BIOPSY     ROBOT ASSISTED LAPAROSCOPIC RADICAL PROSTATECTOMY N/A 08/30/2015   Procedure: ROBOTIC ASSISTED LAPAROSCOPIC RADICAL PROSTATECTOMY;  Surgeon: Belvie CROME Sherrilee, MD;  Location: [facility];  Service: Urology;  Laterality: N/A;   URINARY SPHINCTER IMPLANT N/A 03/20/2021   Procedure: ARTIFICIAL URINARY SPHINCTER CYSTOSCOPY;  Surgeon: Gaston Hamilton, MD;  Location: [facility];  Service: Urology;  Laterality: N/A;  REQUESTING 90 MINS   US  VENOUS LOWER EXT Right 03/07/11   PERSISTENT DVT IN RIGHT LOWER EXT.WITH PERSISTANT VISUALIZATION OF HYPOECHOIC THROMBUS WITHIN THE FEMORAL, PROFUNDA FEMORAL AND POPLITEAL VEINS. WHEN COMPARED TO PREVIOUS, CLOT IS NO LONGER IDENTIFIED WITH IN THE RIGHT CFV.   Scheduled Meds:  allopurinol   100 mg Oral BID   budesonide -glycopyrrolate -formoterol   2 puff Inhalation BID   diclofenac  Sodium  2 g Topical QID   furosemide   80 mg Oral Daily   leflunomide   20 mg Oral Daily   mupirocin  ointment   Topical TID   oxybutynin   10 mg Oral Daily   pantoprazole   40 mg Oral Daily   phenytoin   100 mg Oral Daily   And   phenytoin   200 mg Oral QHS   rosuvastatin   10 mg Oral Daily   sodium chloride  flush  3 mL Intravenous Q12H   sodium chloride  flush  3 mL Intravenous Q12H   spironolactone   50 mg Oral Daily   Continuous Infusions:  [START ON 08/20/2024] vancomycin      vancomycin      PRN Meds:.acetaminophen  **OR** acetaminophen , bisacodyl , hydrALAZINE , HYDROmorphone  (DILAUDID ) injection, ipratropium, levalbuterol , ondansetron  **OR** ondansetron  (ZOFRAN ) IV, oxyCODONE , senna-docusate, sodium phosphate , traZODone   Allergies  Allergen Reactions   Orencia  [Abatacept ] Anaphylaxis    Had prostate cancer   Carbamazepine Rash    Makes  drowsy Brand name is Tegretol    Celecoxib Itching and Rash    Brand name is Celebrex   Cephalexin Rash    Severe Rash. Brand name is Keflex Patient has tolerated ceftriaxone  05/2019   Enbrel [Etanercept] Rash   Humira [Adalimumab] Rash   Levofloxacin  Other (See Comments)    GI Intolerance Brand name is Levaquin    Sulfa Antibiotics Rash   Sulfasalazine Rash   Social History   Socioeconomic History   Marital status: Married    Spouse name: Not on file   Number of children: 3   Years of education: Not on file   Highest education level: Not on file  Occupational History   Occupation: retired    Associate Professor: [employer]  Tobacco Use   Smoking status: Former    Current packs/day: 0.00    Average packs/day: 3.0 packs/day for 20.0 years (60.0 ttl pk-yrs)    Types: Cigarettes    Start date: 12/30/1976    Quit date: 12/30/1996    Years since quitting: 27.6    Passive exposure: Never   Smokeless tobacco: Never  Vaping Use   Vaping status: Never Used  Substance and Sexual Activity   Alcohol use: No    Alcohol/week: 0.0 standard drinks of alcohol   Drug use: No   Sexual activity: Yes  Other Topics Concern   Not on file  Social History Narrative   Not on file   Social Drivers of Health   Financial Resource Strain: Low Risk  (12/16/2022)   Overall Financial Resource Strain (CARDIA)    Difficulty of Paying Living Expenses: Not very hard  Food Insecurity: No Food Insecurity (08/19/2024)   Hunger Vital Sign    Worried About Running Out of Food in the Last Year: Never true    Ran Out of Food in the Last Year: Never true  Transportation Needs: No Transportation Needs (08/19/2024)   PRAPARE - Administrator, Civil Service (Medical): No    Lack of Transportation (Non-Medical): No  Physical Activity: Inactive (11/16/2019)   Exercise Vital Sign    Days of Exercise per Week: 0  days    Minutes of Exercise per Session: 0 min  Stress: No Stress Concern Present (11/16/2019)    [employer] of Occupational Health - Occupational Stress Questionnaire    Feeling of Stress : Not at all  Social Connections: Moderately Integrated (08/19/2024)   Social Connection and Isolation Panel    Frequency of Communication with Friends and Family: More than three times a week    Frequency of Social Gatherings with Friends and Family: Once a week    Attends Religious Services: More than 4 times per year    Active Member of [employer] or Organizations: No    Attends Banker Meetings: Never    Marital Status: Married  Catering manager Violence: Not At Risk (08/19/2024)   Humiliation, Afraid, Rape, and Kick questionnaire    Fear of Current or Ex-Partner: No    Emotionally Abused: No    Physically Abused: No    Sexually Abused: No   Family History  Problem Relation Age of Onset   Alzheimer's disease Mother    Skin cancer Father    Stomach cancer Father    Heart attack Father    Breast cancer Sister    Heart attack Sister    Stroke Sister    Bladder Cancer Sister    Heart murmur Sister    Esophageal cancer Brother    Throat cancer Brother    Stomach cancer Brother    Lung cancer Brother    Colon cancer Brother    Heart attack Brother    Heart attack Brother    Colon cancer Brother    Colon polyps Neg Hx    Rectal cancer Neg Hx    Vitals BP (!) 141/71 (BP Location: Left Arm)   Pulse 73   Temp 98.1 F (36.7 C) (Oral)   Resp 16   Wt 104 kg   SpO2 96%   BMI 33.86 kg/m    Physical Exam Adult male sitting at the edge of bed, having breakfast, not in acute distress, able to speak in full sentences  Picture from 8/20 reviewed    Pertinent Microbiology Results for orders placed or performed during the hospital encounter of 08/18/24  Blood Culture (routine x 2)     Status: None (Preliminary result)   Collection Time: 08/18/24  9:30 AM   Specimen: BLOOD  Result Value Ref Range Status   Specimen Description BLOOD BLOOD RIGHT ARM  Final   Special  Requests   Final    Blood Culture adequate volume BOTTLES DRAWN AEROBIC AND ANAEROBIC   Culture   Final    NO GROWTH 2 DAYS Performed at [facility], [address] [place], [place] [address]    Report Status PENDING  Incomplete  Blood Culture (routine x 2)     Status: None (Preliminary result)   Collection Time: 08/18/24  9:35 AM   Specimen: BLOOD  Result Value Ref Range Status   Specimen Description   Final    BLOOD RIGHT ANTECUBITAL Performed at [facility], [address] [place], [place] [address]    Special Requests   Final    BOTTLES DRAWN AEROBIC AND ANAEROBIC Blood Culture results may not be optimal due to an inadequate volume of blood received in culture bottles Performed at [facility], [address], [place], [place] [address]    Culture  Setup Time   Final    ANAEROBIC BOTTLE ONLY GRAM POSITIVE COCCI Gram Stain Report Called to,Read Back By and Verified With: D.  MISS RN 08/19/24 @0726  BY J. WHITE GRAM STAIN REVIEWED-AGREE WITH RESULT DRT CRITICAL RESULT CALLED TO, READ BACK BY AND VERIFIED WITH: PHARMD STEVEN HURTH ON 08/19/24 @ 1413 BY DRT Performed at [facility], [address] [place], [place] [address]    Culture GRAM POSITIVE COCCI  Final   Report Status PENDING  Incomplete  Blood Culture ID Panel (Reflexed)     Status: Abnormal   Collection Time: 08/18/24  9:35 AM  Result Value Ref Range Status   Enterococcus faecalis NOT DETECTED NOT DETECTED Final   Enterococcus Faecium NOT DETECTED NOT DETECTED Final   Listeria monocytogenes NOT DETECTED NOT DETECTED Final   Staphylococcus species DETECTED (A) NOT DETECTED Final    Comment: CRITICAL RESULT CALLED TO, READ BACK BY AND VERIFIED WITH: PHARMD STEVEN HURTH ON 08/19/24 @ 1413 BY DRT    Staphylococcus aureus (BCID) DETECTED (A) NOT DETECTED Final    Comment: Methicillin (oxacillin)-resistant Staphylococcus aureus (MRSA). MRSA is predictably resistant to beta-lactam antibiotics (except ceftaroline).  Preferred therapy is vancomycin  unless clinically contraindicated. Patient requires contact precautions if  hospitalized. CRITICAL RESULT CALLED TO, READ BACK BY AND VERIFIED WITH: PHARMD STEVEN HURTH ON 08/19/24 @ 1413 BY DRT    Staphylococcus epidermidis NOT DETECTED NOT DETECTED Final   Staphylococcus lugdunensis NOT DETECTED NOT DETECTED Final   Streptococcus species NOT DETECTED NOT DETECTED Final   Streptococcus agalactiae NOT DETECTED NOT DETECTED Final   Streptococcus pneumoniae NOT DETECTED NOT DETECTED Final   Streptococcus pyogenes NOT DETECTED NOT DETECTED Final   A.calcoaceticus-baumannii NOT DETECTED NOT DETECTED Final   Bacteroides fragilis NOT DETECTED NOT DETECTED Final   Enterobacterales NOT DETECTED NOT DETECTED Final   Enterobacter cloacae complex NOT DETECTED NOT DETECTED Final   Escherichia coli NOT DETECTED NOT DETECTED Final   Klebsiella aerogenes NOT DETECTED NOT DETECTED Final   Klebsiella oxytoca NOT DETECTED NOT DETECTED Final   Klebsiella pneumoniae NOT DETECTED NOT DETECTED Final   Proteus species NOT DETECTED NOT DETECTED Final   Salmonella species NOT DETECTED NOT DETECTED Final   Serratia marcescens NOT DETECTED NOT DETECTED Final   Haemophilus influenzae NOT DETECTED NOT DETECTED Final   Neisseria meningitidis NOT DETECTED NOT DETECTED Final   Pseudomonas aeruginosa NOT DETECTED NOT DETECTED Final   Stenotrophomonas maltophilia NOT DETECTED NOT DETECTED Final   Candida albicans NOT DETECTED NOT DETECTED Final   Candida auris NOT DETECTED NOT DETECTED Final   Candida glabrata NOT DETECTED NOT DETECTED Final   Candida krusei NOT DETECTED NOT DETECTED Final   Candida parapsilosis NOT DETECTED NOT DETECTED Final   Candida tropicalis NOT DETECTED NOT DETECTED Final   Cryptococcus neoformans/gattii NOT DETECTED NOT DETECTED Final   Meth resistant mecA/C and MREJ DETECTED (A) NOT DETECTED Final    Comment: CRITICAL RESULT CALLED TO, READ BACK BY AND  VERIFIED WITH: PHARMD STEVEN HURTH ON 08/19/24 @ 1413 BY DRT Performed at [facility], [address] [place], [place] [address]   Culture, blood (Routine X 2) w Reflex to ID Panel     Status: None (Preliminary result)   Collection Time: 08/19/24 11:43 AM   Specimen: BLOOD  Result Value Ref Range Status   Specimen Description BLOOD BLOOD RIGHT ARM  Final   Special Requests   Final    BOTTLES DRAWN AEROBIC AND ANAEROBIC Blood Culture results may not be optimal due to an inadequate volume of blood received in culture bottles   Culture   Final    NO  GROWTH < 24 HOURS Performed at [facility], [address], [place], [place] [address]    Report Status PENDING  Incomplete  Culture, blood (Routine X 2) w Reflex to ID Panel     Status: None (Preliminary result)   Collection Time: 08/19/24 11:43 AM   Specimen: BLOOD  Result Value Ref Range Status   Specimen Description BLOOD BLOOD RIGHT HAND  Final   Special Requests   Final    Blood Culture results may not be optimal due to an inadequate volume of blood received in culture bottles BOTTLES DRAWN AEROBIC AND ANAEROBIC   Culture   Final    NO GROWTH < 24 HOURS Performed at [facility], [address] [place], [place] [address]    Report Status PENDING  Incomplete    Pertinent Lab seen by me:    Latest Ref Rng & Units 08/20/2024    5:05 AM 08/19/2024    4:15 AM 08/18/2024    9:30 AM  CBC  WBC 4.0 - 10.5 K/uL 7.2  11.5  18.0   Hemoglobin 13.0 - 17.0 g/dL 86.6  86.0  85.6   Hematocrit 39.0 - 52.0 % 39.6  43.1  43.4   Platelets 150 - 400 K/uL 125  129  142       Latest Ref Rng & Units 08/20/2024    5:05 AM 08/19/2024    4:15 AM 08/18/2024    9:30 AM  CMP  Glucose 70 - 99 mg/dL 892  899  878   BUN 8 - 23 mg/dL 14  14  18    Creatinine 0.61 - 1.24 mg/dL 8.64  8.60  8.40   Sodium 135 - 145 mmol/L 141  140  140   Potassium 3.5 - 5.1 mmol/L 3.3  3.6  3.5   Chloride 98 - 111 mmol/L 108  105  100   CO2 22 - 32 mmol/L 25  26  26     Calcium  8.9 - 10.3 mg/dL 8.5  8.6  9.2   Total Protein 6.5 - 8.1 g/dL   6.9   Total Bilirubin 0.0 - 1.2 mg/dL   1.0   Alkaline Phos 38 - 126 U/L   58   AST 15 - 41 U/L   29   ALT 0 - 44 U/L   22      Pertinent Imagings/Other Imagings Plain films and CT images have been personally visualized and interpreted; radiology reports have been reviewed. Decision making incorporated into the Impression / Recommendations.  ECHOCARDIOGRAM COMPLETE Result Date: 08/19/2024    ECHOCARDIOGRAM REPORT   Patient Name:   [name] Date of Exam: 08/19/2024 Medical Rec #:  [id number]        Height:       69.0 in Accession #:    [id number]       Weight:       229.3 lb Date of Birth:  October 19, 1951       BSA:          2.190 m Patient Age:    72 years         BP:           141/71 mmHg Patient Gender: M                HR:           73 bpm. Exam Location:  [name] Procedure: 2D Echo, Cardiac Doppler, Color Doppler and Intracardiac  Opacification Agent (Both Spectral and Color Flow Doppler were            utilized during procedure). Indications:    Bacteremia  History:        Patient has prior history of Echocardiogram examinations.                 Signs/Symptoms:Bacteremia.  Sonographer:    Vella Key Referring Phys: [phone] SEYED A SHAHMEHDI  Sonographer Comments: Technically difficult study due to poor echo windows. IMPRESSIONS  1. Poor Echo windows for evaluation of infective endocarditis. Valves not well visualized. Consider TEE if clinically indicated.  2. No LV thrombus by Definity . Left ventricular ejection fraction, by estimation, is 70 to 75%. The left ventricle has hyperdynamic function. The left ventricle has no regional wall motion abnormalities. Left ventricular diastolic parameters are consistent with Grade I diastolic dysfunction (impaired relaxation).  3. Right ventricular systolic function was not well visualized. The right ventricular size is normal. Tricuspid regurgitation signal is inadequate  for assessing PA pressure.  4. The mitral valve was not well visualized. No evidence of mitral valve regurgitation. No evidence of mitral stenosis.  5. The aortic valve was not well visualized. Aortic valve regurgitation is not visualized. No aortic stenosis is present. FINDINGS  Left Ventricle: No LV thrombus by Definity . Left ventricular ejection fraction, by estimation, is 70 to 75%. The left ventricle has hyperdynamic function. The left ventricle has no regional wall motion abnormalities. Definity  contrast agent was given IV  to delineate the left ventricular endocardial borders. Strain was performed and the global longitudinal strain is indeterminate. The left ventricular internal cavity size was normal in size. There is no left ventricular hypertrophy. Left ventricular diastolic parameters are consistent with Grade I diastolic dysfunction (impaired relaxation). Normal left ventricular filling pressure. Right Ventricle: The right ventricular size is normal. No increase in right ventricular wall thickness. Right ventricular systolic function was not well visualized. Tricuspid regurgitation signal is inadequate for assessing PA pressure. Left Atrium: Left atrial size was normal in size. Right Atrium: Right atrial size was normal in size. Pericardium: There is no evidence of pericardial effusion. Mitral Valve: The mitral valve was not well visualized. No evidence of mitral valve regurgitation. No evidence of mitral valve stenosis. Tricuspid Valve: The tricuspid valve is not well visualized. Tricuspid valve regurgitation is not demonstrated. No evidence of tricuspid stenosis. Aortic Valve: The aortic valve was not well visualized. Aortic valve regurgitation is not visualized. No aortic stenosis is present. Pulmonic Valve: The pulmonic valve was not well visualized. Pulmonic valve regurgitation is not visualized. No evidence of pulmonic stenosis. Aorta: The aortic root is normal in size and structure. Venous: The  inferior vena cava was not well visualized. IAS/Shunts: No atrial level shunt detected by color flow Doppler. Additional Comments: 3D was performed not requiring image post processing on an independent workstation and was indeterminate.   LV Volumes (MOD) LV vol d, MOD A2C: 48.6 ml Diastology LV vol d, MOD A4C: 36.0 ml LV e' medial:    8.27 cm/s LV vol s, MOD A2C: 8.1 ml  LV E/e' medial:  8.1 LV vol s, MOD A4C: 6.0 ml  LV e' lateral:   6.09 cm/s LV SV MOD A2C:     40.5 ml LV E/e' lateral: 11.0 LV SV MOD A4C:     36.0 ml LV SV MOD BP:      37.0 ml RIGHT VENTRICLE RV Basal diam:  3.20 cm RV S prime:  11.60 cm/s TAPSE (M-mode): 3.6 cm LEFT ATRIUM           Index        RIGHT ATRIUM           Index LA Vol (A2C): 53.0 ml 24.21 ml/m  RA Area:     18.30 cm LA Vol (A4C): 62.5 ml 28.55 ml/m  RA Volume:   53.80 ml  24.57 ml/m  AORTIC VALVE LVOT Vmax:   75.00 cm/s LVOT Vmean:  55.100 cm/s LVOT VTI:    0.176 m MITRAL VALVE MV Area (PHT): 4.83 cm    SHUNTS MV Decel Time: 157 msec    Systemic VTI: 0.18 m MV E velocity: 66.90 cm/s MV A velocity: 76.80 cm/s MV E/A ratio:  0.87 Vishnu Priya Mallipeddi Electronically signed by Diannah Late Mallipeddi Signature Date/Time: 08/19/2024/4:45:53 PM    Final    US  ARTERIAL ABI (SCREENING LOWER EXTREMITY) Result Date: 08/19/2024 CLINICAL DATA:  Edema. EXAM: NONINVASIVE PHYSIOLOGIC VASCULAR STUDY OF BILATERAL LOWER EXTREMITIES TECHNIQUE: Evaluation of both lower extremities were performed at rest, including calculation of ankle-brachial indices with single level pressure measurements and doppler recording. COMPARISON:  None available. FINDINGS: Right ABI:  1.03 Left ABI:  1.14 Right Lower Extremity:  Normal arterial waveforms at the ankle. Left Lower Extremity:  Normal arterial waveforms at the ankle. 1.0-1.4 Normal IMPRESSION: No evidence of significant lower extremity peripheral arterial disease Electronically Signed   By: Aliene Lloyd M.D.   On: 08/19/2024 12:58   US  Venous Img  Lower Bilateral Addendum Date: 08/18/2024 ADDENDUM REPORT: 08/18/2024 11:38 ADDENDUM: Critical Value/emergent results were called by telephone at the time of interpretation by Dr Duwaine Severs on August 18, 2024 to provider Dr. Freddi who verbally acknowledged these results. Electronically Signed   By: Megan  Zare M.D.   On: 08/18/2024 11:38   Result Date: 08/18/2024 CLINICAL DATA:  Chronic lower extremity edema EXAM: Bilateral LOWER EXTREMITY VENOUS DOPPLER ULTRASOUND TECHNIQUE: Gray-scale sonography with compression, as well as color and duplex ultrasound, were performed to evaluate the deep venous system(s) from the level of the common femoral vein through the popliteal and proximal calf veins. COMPARISON:  July 26, 2024 FINDINGS: VENOUS There is a partially occlusive thrombus within the normal-sized left popliteal vein with lack of complete compressibility. Otherwise there is normal compressibility of the common femoral, superficial femoral, and popliteal veins, as well as the remainder of the visualized calf veins. Visualized portions of profunda femoral vein and great saphenous vein unremarkable. No filling defects to suggest DVT on grayscale or color Doppler imaging within the remainder of the vessels. Doppler waveforms show normal direction of venous flow, normal respiratory plasticity and response to augmentation. OTHER Bilateral lower extremity soft tissue edema distal to the knee. Limitations: none IMPRESSION: Partially occlusive left popliteal vein thrombus with lack of complete compressibility. No DVT on the right. Soft tissue edema in bilateral lower extremity distal to the knee. Electronically Signed: By: Megan  Zare M.D. On: 08/18/2024 11:32   DG Chest Port 1 View Result Date: 08/18/2024 CLINICAL DATA:  Fever. EXAM: PORTABLE CHEST 1 VIEW COMPARISON:  December 26, 2023. FINDINGS: Stable cardiomediastinal silhouette. Stable elevated left hemidiaphragm. No definite acute pulmonary disease is noted.  Bony thorax is unremarkable. IMPRESSION: No active disease. Electronically Signed   By: Lynwood Landy Raddle M.D.   On: 08/18/2024 09:36   DG Tibia/Fibula Right Result Date: 08/18/2024 CLINICAL DATA:  Right lower extremity pain and swelling for several weeks. EXAM: RIGHT TIBIA AND FIBULA -  2 VIEW COMPARISON:  None Available. FINDINGS: There is no evidence of fracture or other focal bone lesions. Soft tissues are unremarkable. IMPRESSION: Negative. Electronically Signed   By: Lynwood Landy Raddle M.D.   On: 08/18/2024 09:34   US  Venous Img Lower Unilateral Right (DVT) Result Date: 07/26/2024 CLINICAL DATA:  Chronic leg edema for 10 years EXAM: Right LOWER EXTREMITY VENOUS DOPPLER ULTRASOUND TECHNIQUE: Gray-scale sonography with compression, as well as color and duplex ultrasound, were performed to evaluate the deep venous system(s) from the level of the common femoral vein through the popliteal and proximal calf veins. COMPARISON:  None Available. FINDINGS: VENOUS Normal compressibility of the common femoral, superficial femoral, and popliteal veins, as well as the visualized calf veins. Visualized portions of profunda femoral vein and great saphenous vein unremarkable. No filling defects to suggest DVT on grayscale or color Doppler imaging. Doppler waveforms show normal direction of venous flow, normal respiratory plasticity and response to augmentation. Limited views of the contralateral common femoral vein are unremarkable. OTHER None. Limitations: none IMPRESSION: No evidence of right lower extremity DVT. Electronically Signed   By: Ranell Bring M.D.   On: 07/26/2024 10:37     I discussed the assessment and treatment plan with the patient. The patient was provided an opportunity to ask questions and all were answered. The patient agreed with the plan and demonstrated an understanding of the instructions.   The patient was advised to call back or seek an in-person evaluation if the symptoms worsen or if the  condition fails to improve as anticipated.  I spent 80 minutes involved in face-to-face and non-face-to-face activities for this patient on the day of the visit. Professional time spent includes the following activities: Preparing to see the patient (review of tests), Obtaining and reviewing separately obtained history (ED note, H&P, hospitalist progress note), Performing a medically appropriate examination and evaluation , Ordering medications/labs, referring and communicating with other health care professionals, Documenting clinical information in the EMR, Independently interpreting results (not separately reported), Communicating results to the patient, Counseling and educating the patient and Care coordination (not separately reported).  Electronically signed by:   Plan d/w requesting provider as well as ID pharm D  Of note, portions of this note may have been created with voice recognition software. While this note has been edited for accuracy, occasional wrong-word or 'sound-a-like' substitutions may have occurred due to the inherent limitations of voice recognition software.   Mark Orem, MD Infectious Disease Physician [facility] for Infectious Disease Pager: [phone]

## 2024-08-19 NOTE — Discharge Summary (Incomplete)
 Physician Discharge Summary   Patient: [name] MRN: [id number] DOB: 1951-11-05  Admit date:     08/18/2024  Discharge date: 08/19/24  Discharge Physician: [name]   PCP: Marvine Rush, MD   Recommendations at discharge:   Follow-up with PCP in 1 week Follow-up with nephrologist in 1-2 weeks Follow-up with the vascular surgeon team in 2-4 weeks (for chronic DVT, chronic right lower extremity edema-venous stasis) CBC CMP in 1 week results to PCP Continue left lower extremity wound dressing changes per instructions Follow the final blood cultures with PCP  Discharge Diagnoses: Principal Problem:   Sepsis (HCC) Active Problems:   Chronic anticoagulation   Personal history of DVT (deep vein thrombosis)   Weakness   GERD   Rheumatoid arthritis (HCC)   Malignant neoplasm of prostate (HCC)   DVT (deep venous thrombosis) (HCC)   COPD with acute exacerbation (HCC)   History of COPD   History of seizure disorder   Primary osteoarthritis of both feet   Dyspnea on exertion   OSA (obstructive sleep apnea)   Class 2 severe obesity due to excess calories with serious comorbidity and body mass index (BMI) of 35.0 to 35.9 in adult (HCC)   Chronic diastolic HF (heart failure) (HCC)   Stage 3a chronic kidney disease (CKD) (HCC)  Resolved Problems:   * No resolved hospital problems. *  Hospital Course: [name] is a 73 year old male with extensive history of rheumatoid arthritis osteoarthritis, chronic DVT on Coumadin , diastolic CHF, COPD, gout, GERD..  Presented to ED with chief complaint of right leg pain and fever.  Reported has been going on for past 2 weeks.  Chronic right lower extremity edema.  Ultrasound 2 weeks ago was negative for DVT.  Otherwise is progressively becoming more erythematous with edema. Tmax at home 100.6.  No other complaints.   ED evaluation: Blood pressure (!) 153/83, pulse 78, temperature 97.9 F (36.6 C), temperature source Oral,  resp. rate 17, SpO2 96%.  Abnormal labs: WBC 18, platelets 142, creatinine 1.59, INR 3.2, Cr 2.3>> 1.6  Lower extremity Doppler study: Right soft tissue swelling, cellulitis, left: Partially occlusive left popliteal vein thrombus with lack of complete compressibilit   Patient was given broad-spectrum antibiotics Rocephin , IV fluids requested to be admitted for cellulitis  Assessment and Plan: * Sepsis (HCC) Sepsis was ruled in--responded to IV fluids, IV antibiotics Afebrile, normotensive, improved lactic acidosis, improved kidney function, and leukocytosis - Right lower extremity cellulitis -with open wound, negative for DVT POA: Patient meets sepsis due to cellulitis-  w leukocytosis WBC 18, lactic acid 2.3, creatinine 1.59, RR 21 -Immunocompromised -patient received Rocephin  in ED, broaden spectrum antibiotic to Zyvox  -Continue IV fluid resuscitation based on sepsis protocol - Will follow the blood cultures  Chronic anticoagulation - Chronically anticoagulated for chronic DVT on Coumadin  INR therapeutic at 3.2 -Will dose per pharmacy  Weakness - Consulting PT OT for evaluation recommendations  Personal history of DVT (deep vein thrombosis) Continue Coumadin  Left lower extremity Doppler positive for persistent chronic DVT  Stage 3a chronic kidney disease (CKD) (HCC) - Monitoring closely, avoiding nephrotoxins, avoiding hypotension -Mildly elevated BUN/creatinine from baseline  Lab Results  Component Value Date   CREATININE 1.59 (H) 08/18/2024   CREATININE 1.52 (H) 08/17/2024   CREATININE 1.41 (H) 06/22/2024   estimated creatinine clearance is 48.2 mL/min (A) (by C-G formula based on SCr of 1.59 mg/dL (H)).   Chronic diastolic HF (heart failure) (HCC) - Will monitor I's and O's, daily  weights -Unfortunately patient needs fluid resuscitation due to meeting sepsis criteria -Will monitor closely for volume overload, resuming his home medication of 80 mg Lasix  in  a.m. -Last echo March 2023 EGF 55-60% normal LV function normal RV function valves within normal limits, no LVH  Class 2 severe obesity due to excess calories with serious comorbidity and body mass index (BMI) of 35.0 to 35.9 in adult (HCC) Height 5'9, weight 99.8 kg, BMI 32.49 kg/m -Discussed with patient, advised follow-up with PCP regarding outpatient weight loss programs  OSA (obstructive sleep apnea) Monitor closely, nightly CPAP  Dyspnea on exertion - As needed DuoNeb bronchodilators -Continue home inhalers  Primary osteoarthritis of both feet - Continue current meds, With holding immunosuppressants  History of seizure disorder - Stable, continue home medications  History of COPD No signs of exacerbation, as needed DuoNeb bronchodilators, continue home dose inhalers  COPD with acute exacerbation (HCC) - Currently stable, continue inhalers as needed, supplemental oxygen  as needed to maintain O2 sat greater than 90%  DVT (deep venous thrombosis) (HCC) - Chronically on Coumadin , bilateral lower extremity Doppler studies still revealing left lower extremity DVT, right cellulitic changes -INR therapeutic at 3.2  Malignant neoplasm of prostate (HCC) - Follow-up with urologist as outpatient  Rheumatoid arthritis (HCC) Stable continue meds  GERD Continue PPI      {Tip this will not be part of the note when signed Body mass index is 33.86 kg/m. , ,  (Optional):26781}  {(NOTE) Pain control PDMP Statment (Optional):26782} Consultants: *** Procedures performed: ***  Disposition: {Plan; Disposition:26390} Diet recommendation:  Discharge Diet Orders (From admission, onward)     Start     Ordered   08/19/24 0000  Diet - low sodium heart healthy        08/19/24 1048           {Diet_Plan:26776} DISCHARGE MEDICATION: Allergies as of 08/19/2024       Reactions   Orencia  [abatacept ] Anaphylaxis   Had prostate cancer   Carbamazepine Rash   Makes drowsy Brand  name is Tegretol    Celecoxib Itching, Rash   Brand name is Celebrex   Cephalexin Rash   Severe Rash. Brand name is Keflex Patient has tolerated ceftriaxone  05/2019   Enbrel [etanercept] Rash   Humira [adalimumab] Rash   Levofloxacin  Other (See Comments)   GI Intolerance Brand name is Levaquin    Sulfa Antibiotics Rash   Sulfasalazine Rash        Medication List     TAKE these medications    acetaminophen  500 MG tablet Commonly known as: TYLENOL  Take 1,000 mg by mouth every 6 (six) hours as needed for fever.   ACTEMRA  IV Inject into the vein every 28 (twenty-eight) days.   allopurinol  100 MG tablet Commonly known as: ZYLOPRIM  Take 1 tablet (100 mg total) by mouth 2 (two) times daily.   ascorbic acid 500 MG tablet Commonly known as: VITAMIN C Take 500 mg by mouth at bedtime.   Breztri  Aerosphere 160-9-4.8 MCG/ACT Aero inhaler Generic drug: budesonide -glycopyrrolate -formoterol  Inhale 2 puffs into the lungs in the morning.   cholecalciferol 25 MCG (1000 UNIT) tablet Commonly known as: VITAMIN D3 Take 1,000 Units by mouth at bedtime.   clindamycin  300 MG capsule Commonly known as: CLEOCIN  Take 1 capsule (300 mg total) by mouth 3 (three) times daily for 6 days.   dicyclomine  10 MG capsule Commonly known as: BENTYL  TAKE 1 CAPSULE BY MOUTH 4 TIMES DAILY BEFORE MEALS AND AT BEDTIME What changed: See the  new instructions.   furosemide  80 MG tablet Commonly known as: LASIX  TAKE 1 TABLET BY MOUTH EVERY DAY What changed: when to take this   lactobacillus acidophilus & bulgar chewable tablet Chew 1 tablet by mouth 3 (three) times daily with meals for 10 days.   leflunomide  20 MG tablet Commonly known as: ARAVA  TAKE 1 TABLET BY MOUTH EVERY DAY   mupirocin  ointment 2 % Commonly known as: BACTROBAN  Apply 1 Application topically daily.   omeprazole  40 MG capsule Commonly known as: PRILOSEC Take 1 capsule (40 mg total) by mouth daily. What changed: when to take  this   oxybutynin  10 MG 24 hr tablet Commonly known as: DITROPAN -XL Take 1 tablet (10 mg total) by mouth daily. What changed: when to take this   phenytoin  100 MG ER capsule Commonly known as: DILANTIN  Take 100-200 mg by mouth 2 (two) times daily. Take 1 capsule in the morning and 2 capsules at bedtime   Potassium Chloride  ER 20 MEQ Tbcr Take 20 mEq by mouth daily.   rosuvastatin  10 MG tablet Commonly known as: CRESTOR  Take 10 mg by mouth at bedtime.   warfarin 4 MG tablet Commonly known as: COUMADIN  Take 4 mg by mouth at bedtime.               Discharge Care Instructions  (From admission, onward)           Start     Ordered   08/19/24 0000  Discharge wound care:       Comments: Per instructions from wound care nurse   08/19/24 1048            Discharge Exam: Filed Weights   08/19/24 0429  Weight: 104 kg   ***  Condition at discharge: {DC Condition:26389}  The results of significant diagnostics from this hospitalization (including imaging, microbiology, ancillary and laboratory) are listed below for reference.   Imaging Studies: US  Venous Img Lower Bilateral Addendum Date: 08/18/2024 ADDENDUM REPORT: 08/18/2024 11:38 ADDENDUM: Critical Value/emergent results were called by telephone at the time of interpretation by Dr Duwaine Severs on August 18, 2024 to provider Dr. Freddi who verbally acknowledged these results. Electronically Signed   By: Megan  Zare M.D.   On: 08/18/2024 11:38   Result Date: 08/18/2024 CLINICAL DATA:  Chronic lower extremity edema EXAM: Bilateral LOWER EXTREMITY VENOUS DOPPLER ULTRASOUND TECHNIQUE: Gray-scale sonography with compression, as well as color and duplex ultrasound, were performed to evaluate the deep venous system(s) from the level of the common femoral vein through the popliteal and proximal calf veins. COMPARISON:  July 26, 2024 FINDINGS: VENOUS There is a partially occlusive thrombus within the normal-sized left  popliteal vein with lack of complete compressibility. Otherwise there is normal compressibility of the common femoral, superficial femoral, and popliteal veins, as well as the remainder of the visualized calf veins. Visualized portions of profunda femoral vein and great saphenous vein unremarkable. No filling defects to suggest DVT on grayscale or color Doppler imaging within the remainder of the vessels. Doppler waveforms show normal direction of venous flow, normal respiratory plasticity and response to augmentation. OTHER Bilateral lower extremity soft tissue edema distal to the knee. Limitations: none IMPRESSION: Partially occlusive left popliteal vein thrombus with lack of complete compressibility. No DVT on the right. Soft tissue edema in bilateral lower extremity distal to the knee. Electronically Signed: By: Megan  Zare M.D. On: 08/18/2024 11:32   DG Chest Port 1 View Result Date: 08/18/2024 CLINICAL DATA:  Fever. EXAM: PORTABLE CHEST  1 VIEW COMPARISON:  December 26, 2023. FINDINGS: Stable cardiomediastinal silhouette. Stable elevated left hemidiaphragm. No definite acute pulmonary disease is noted. Bony thorax is unremarkable. IMPRESSION: No active disease. Electronically Signed   By: Lynwood Landy Raddle M.D.   On: 08/18/2024 09:36   DG Tibia/Fibula Right Result Date: 08/18/2024 CLINICAL DATA:  Right lower extremity pain and swelling for several weeks. EXAM: RIGHT TIBIA AND FIBULA - 2 VIEW COMPARISON:  None Available. FINDINGS: There is no evidence of fracture or other focal bone lesions. Soft tissues are unremarkable. IMPRESSION: Negative. Electronically Signed   By: Lynwood Landy Raddle M.D.   On: 08/18/2024 09:34   US  Venous Img Lower Unilateral Right (DVT) Result Date: 07/26/2024 CLINICAL DATA:  Chronic leg edema for 10 years EXAM: Right LOWER EXTREMITY VENOUS DOPPLER ULTRASOUND TECHNIQUE: Gray-scale sonography with compression, as well as color and duplex ultrasound, were performed to evaluate the deep  venous system(s) from the level of the common femoral vein through the popliteal and proximal calf veins. COMPARISON:  None Available. FINDINGS: VENOUS Normal compressibility of the common femoral, superficial femoral, and popliteal veins, as well as the visualized calf veins. Visualized portions of profunda femoral vein and great saphenous vein unremarkable. No filling defects to suggest DVT on grayscale or color Doppler imaging. Doppler waveforms show normal direction of venous flow, normal respiratory plasticity and response to augmentation. Limited views of the contralateral common femoral vein are unremarkable. OTHER None. Limitations: none IMPRESSION: No evidence of right lower extremity DVT. Electronically Signed   By: Ranell Bring M.D.   On: 07/26/2024 10:37    Microbiology: Results for orders placed or performed during the hospital encounter of 08/18/24  Blood Culture (routine x 2)     Status: None (Preliminary result)   Collection Time: 08/18/24  9:30 AM   Specimen: BLOOD  Result Value Ref Range Status   Specimen Description BLOOD BLOOD RIGHT ARM  Final   Special Requests   Final    Blood Culture adequate volume BOTTLES DRAWN AEROBIC AND ANAEROBIC   Culture   Final    NO GROWTH < 24 HOURS Performed at [facility], [address] [place], [place] [address]    Report Status PENDING  Incomplete  Blood Culture (routine x 2)     Status: None (Preliminary result)   Collection Time: 08/18/24  9:35 AM   Specimen: BLOOD  Result Value Ref Range Status   Specimen Description BLOOD RIGHT ANTECUBITAL  Final   Special Requests   Final    BOTTLES DRAWN AEROBIC AND ANAEROBIC Blood Culture results may not be optimal due to an inadequate volume of blood received in culture bottles   Culture  Setup Time   Final    ANAEROBIC BOTTLE ONLY GRAM POSITIVE COCCI Gram Stain Report Called to,Read Back By and Verified With: D. MISS RN 08/19/24 @0726  BY J. WHITE Performed at [facility], [address] [place], [place] [address]    Culture [facility] POSITIVE COCCI  Final   Report Status PENDING  Incomplete    Labs: CBC: Recent Labs  Lab 08/17/24 1058 08/18/24 0930 08/19/24 0415  WBC 8.0 18.0* 11.5*  NEUTROABS 4.9 16.0*  --   HGB 14.9 14.3 13.9  HCT 45.1 43.4 43.1  MCV 88.6 91.0 91.3  PLT 152 142* 129*   Basic Metabolic Panel: Recent Labs  Lab 08/17/24 1058 08/18/24 0930 08/19/24 0415  NA 140 140 140  K 4.1 3.5 3.6  CL 102 100 105  CO2 28  26 26  GLUCOSE 110* 121* 100*  BUN 18 18 14   CREATININE 1.52* 1.59* 1.39*  CALCIUM  9.3 9.2 8.6*  MG  --  1.8  --   PHOS  --  3.3  --    Liver Function Tests: Recent Labs  Lab 08/17/24 1058 08/18/24 0930  AST 30 29  ALT 23 22  ALKPHOS 52 58  BILITOT 0.9 1.0  PROT 6.8 6.9  ALBUMIN 4.0 3.9   CBG: Recent Labs  Lab 08/19/24 0812  GLUCAP 104*    Discharge time spent: {LESS THAN/GREATER THAN:26388} 30 minutes.  Signed: [name], MD [facility] 08/19/2024

## 2024-08-19 NOTE — Evaluation (Signed)
 Occupational Therapy Evaluation Patient Details Name: [name] MRN: [id number] DOB: 1951-03-18 Today's Date: 08/19/2024   History of Present Illness   [name] is a 73 year old male with extensive history of rheumatoid arthritis osteoarthritis, chronic DVT on Coumadin , diastolic CHF,  COPD, gout, GERD..  Presented to ED with chief complaint of right leg pain and fever.  Reported has been going on for past 2 weeks.  Chronic right lower extremity edema.  Ultrasound 2 weeks ago was negative for DVT.  Otherwise is progressively becoming more erythematous with edema.  Tmax at home 100.6.  No other complaints. (per MD)     Clinical Impressions Pt agreeable to OT and PT co-evaluation. Pt appears to be near baseline function for ADL's and functional mobility. Mild balance deficits observed. Deferring to PT for balance issues. WFL B UE strength and functional use. No issues with lower or upper body ADL's per observation and clinical judgement. Pt is not recommended for further acute OT services and will be discharged to care of nursing staff for remaining length of stay.               Functional Status Assessment   Patient has not had a recent decline in their functional status     Equipment Recommendations   None recommended by OT             Precautions/Restrictions   Precautions Precautions: Fall Recall of Precautions/Restrictions: Intact Restrictions Weight Bearing Restrictions Per Provider Order: No     Mobility Bed Mobility Overal bed mobility: Independent                  Transfers Overall transfer level: Independent                        Balance Overall balance assessment: Mild deficits observed, not formally tested                                         ADL either performed or assessed with clinical judgement   ADL Overall ADL's : Independent                                        General ADL Comments: Able to don socks without assist at EOB. Good functional ambulation and B UE A/ROM.     Vision Baseline Vision/History: 1 Wears glasses Ability to See in Adequate Light: 0 Adequate Patient Visual Report: No change from baseline Vision Assessment?: No apparent visual deficits;Wears glasses for reading     Perception Perception: Not tested       Praxis Praxis: Not tested       Pertinent Vitals/Pain Pain Assessment Pain Assessment: 0-10 Pain Score: 2  Pain Location: R leg Pain Descriptors / Indicators: Other (Comment) (touchy) Pain Intervention(s): Monitored during session     Extremity/Trunk Assessment Upper Extremity Assessment Upper Extremity Assessment: Overall WFL for tasks assessed   Lower Extremity Assessment Lower Extremity Assessment: Defer to PT evaluation   Cervical / Trunk Assessment Cervical / Trunk Assessment: Normal   Communication Communication Communication: No apparent difficulties   Cognition Arousal: Alert Behavior During Therapy: WFL for tasks assessed/performed Cognition: No apparent impairments  Following commands: Intact       Cueing  General Comments   Cueing Techniques: Verbal cues                 Home Living Family/patient expects to be discharged to:: Private residence Living Arrangements: Spouse/significant other Available Help at Discharge: Family;Available 24 hours/day Type of Home: House Home Access: Stairs to enter [employer] of Steps: 2 Entrance Stairs-Rails: Can reach both Home Layout: One level     Bathroom Shower/Tub: Producer, television/film/video: Handicapped height (and standard) Bathroom Accessibility: Yes How Accessible: Accessible via wheelchair;Accessible via walker Home Equipment: Grab bars - tub/shower          Prior Functioning/Environment Prior Level of Function : Independent/Modified Independent              Mobility Comments: Community ambulator without AD; drives ADLs Comments: Independent                            Co-evaluation PT/OT/SLP Co-Evaluation/Treatment: Yes Reason for Co-Treatment: To address functional/ADL transfers   OT goals addressed during session: ADL's and self-care                       End of Session    Activity Tolerance: Patient tolerated treatment well Patient left: in bed;with call bell/phone within reach  OT Visit Diagnosis: Unsteadiness on feet (R26.81)                Time: 9181-9173 OT Time Calculation (min): 8 min Charges:  OT General Charges $OT Visit: 1 Visit OT Evaluation $OT Eval Low Complexity: 1 Low  [name] OT, MOT  [name] 08/19/2024, 10:20 AM

## 2024-08-19 NOTE — Evaluation (Signed)
 Physical Therapy Evaluation Patient Details Name: [name] MRN: [id number] DOB: [birth year] Today's Date: 08/19/2024  History of Present Illness  [name] is a 73 year old male with extensive history of rheumatoid arthritis osteoarthritis, chronic DVT on Coumadin , diastolic CHF,  COPD, gout, GERD..  Presented to ED with chief complaint of right leg pain and fever.  Reported has been going on for past 2 weeks.  Chronic right lower extremity edema.  Ultrasound 2 weeks ago was negative for DVT.  Otherwise is progressively becoming more erythematous with edema.  Tmax at home 100.6.  No other complaints.   Clinical Impression  Patient agreeable to OT/PT co-evaluation. On this date, patient is independent with all bed mobility, functional transfers (STS, and toilet), and ambulation without an AD. Patient demonstrates mild unsteadiness during ambulation which he reports as baseline due to neuropathy and RA. Patient RLE dressing dry and intact during session. Patient demonstrates good strength in BLE, grossly 4 to 4+/5. Patient returned to sitting at bedside to finish breakfast with call button within reach. Patient does not present with urgent need for skilled physical therapy acutely at this time but may benefit from outpatient PT services once discharged. Patient discharged to care of nursing for ambulation daily as tolerated for length of stay.      If plan is discharge home, recommend the following:     Can travel by private vehicle        Equipment Recommendations None recommended by PT  Recommendations for Other Services       Functional Status Assessment Patient has had a recent decline in their functional status and demonstrates the ability to make significant improvements in function in a reasonable and predictable amount of time.     Precautions / Restrictions Precautions Precautions: Fall Recall of Precautions/Restrictions: Intact Restrictions Weight Bearing  Restrictions Per Provider Order: No      Mobility  Bed Mobility Overal bed mobility: Independent       General bed mobility comments: HOB flat, no use of railings    Transfers Overall transfer level: Independent     General transfer comment: STS and toilet transfer    Ambulation/Gait Ambulation/Gait assistance: Supervision, Independent Gait Distance (Feet): 250 Feet Assistive device: None Gait Pattern/deviations: Drifts right/left Gait velocity: WNL     General Gait Details: supervision/CGA initially for safety but pt independent. Reports history of neuropathy and dec sensation to feet that causes his intermittent imbalance. no overt LOB this date but mildly drifts at times.  Stairs            Wheelchair Mobility     Tilt Bed    Modified Rankin (Stroke Patients Only)       Balance Overall balance assessment: Mild deficits observed, not formally tested           Pertinent Vitals/Pain Pain Assessment Pain Assessment: 0-10 Pain Score: 2  Pain Location: R leg Pain Intervention(s): Limited activity within patient's tolerance, Monitored during session    Home Living Family/patient expects to be discharged to:: Private residence Living Arrangements: Spouse/significant other Available Help at Discharge: Family;Available 24 hours/day Type of Home: House Home Access: Stairs to enter Entrance Stairs-Rails: Can reach both Entrance Stairs-Number of Steps: 2   Home Layout: One level Home Equipment: Grab bars - tub/shower      Prior Function Prior Level of Function : Independent/Modified Independent     Mobility Comments: Community ambulator without AD; drives ADLs Comments: Independent with ADLs and iADLs     Extremity/Trunk  Assessment   Upper Extremity Assessment Upper Extremity Assessment: Defer to OT evaluation    Lower Extremity Assessment Lower Extremity Assessment: Overall WFL for tasks assessed    Cervical / Trunk  Assessment Cervical / Trunk Assessment: Normal  Communication   Communication Communication: No apparent difficulties    Cognition Arousal: Alert Behavior During Therapy: WFL for tasks assessed/performed   PT - Cognitive impairments: No apparent impairments       Following commands: Intact       Cueing Cueing Techniques: Verbal cues     General Comments      Exercises     Assessment/Plan    PT Assessment All further PT needs can be met in the next venue of care  PT Problem List Decreased balance;Decreased activity tolerance       PT Treatment Interventions      PT Goals (Current goals can be found in the Care Plan section)  Acute Rehab PT Goals Patient Stated Goal: Return home PT Goal Formulation: With patient Time For Goal Achievement: 08/20/24 Potential to Achieve Goals: Good    Frequency       Co-evaluation PT/OT/SLP Co-Evaluation/Treatment: Yes Reason for Co-Treatment: To address functional/ADL transfers PT goals addressed during session: Mobility/safety with mobility OT goals addressed during session: ADL's and self-care       AM-PAC PT 6 Clicks Mobility  Outcome Measure Help needed turning from your back to your side while in a flat bed without using bedrails?: None Help needed moving from lying on your back to sitting on the side of a flat bed without using bedrails?: None Help needed moving to and from a bed to a chair (including a wheelchair)?: None Help needed standing up from a chair using your arms (e.g., wheelchair or bedside chair)?: None Help needed to walk in hospital room?: None Help needed climbing 3-5 steps with a railing? : A Little 6 Click Score: 23    End of Session   Activity Tolerance: Patient tolerated treatment well Patient left: in bed;with call bell/phone within reach   PT Visit Diagnosis: Unsteadiness on feet (R26.81)    Time: 9181-9173 PT Time Calculation (min) (ACUTE ONLY): 8 min   Charges:   PT  Evaluation $PT Eval Low Complexity: 1 Low   PT General Charges $$ ACUTE PT VISIT: 1 Visit        12:31 PM, 08/19/24 Rosaria Settler, PT, DPT [place] with [facility]

## 2024-08-19 NOTE — Progress Notes (Signed)
 PROGRESS NOTE    Patient: [name]                            PCP: Marvine Rush, MD                    DOB: 1951/11/27            DOA: 08/18/2024 FMW:[id number]             DOS: 08/19/2024, 10:51 AM   LOS: 1 day   Date of Service: The patient was seen and examined on 08/19/2024  Subjective:   The patient was seen and examined this morning. Hypertensive otherwise hemodynamically stable. No issues overnight .  Brief Narrative:   WANE MOLLETT is a 73 year old male with extensive history of rheumatoid arthritis osteoarthritis, chronic DVT on Coumadin , diastolic CHF, COPD, gout, GERD..  Presented to ED with chief complaint of right leg pain and fever.  Reported has been going on for past 2 weeks.  Chronic right lower extremity edema.  Ultrasound 2 weeks ago was negative for DVT.  Otherwise is progressively becoming more erythematous with edema. Tmax at home 100.6.  No other complaints.   ED evaluation: Blood pressure (!) 153/83, pulse 78, temperature 97.9 F (36.6 C), temperature source Oral, resp. rate 17, SpO2 96%.  Abnormal labs: WBC 18, platelets 142, creatinine 1.59, INR 3.2, Cr 2.3>> 1.6  Lower extremity Doppler study: Right soft tissue swelling, cellulitis, left: Partially occlusive left popliteal vein thrombus with lack of complete compressibilit   Patient was given broad-spectrum antibiotics Rocephin , IV fluids requested to be admitted for cellulitis    Assessment & Plan:   Principal Problem:   Sepsis (HCC) Active Problems:   Chronic anticoagulation   Personal history of DVT (deep vein thrombosis)   Weakness   GERD   Rheumatoid arthritis (HCC)   Malignant neoplasm of prostate (HCC)   DVT (deep venous thrombosis) (HCC)   COPD with acute exacerbation (HCC)   History of COPD   History of seizure disorder   Primary osteoarthritis of both feet   Dyspnea on exertion   OSA (obstructive sleep apnea)   Class 2 severe obesity due to excess calories with  serious comorbidity and body mass index (BMI) of 35.0 to 35.9 in adult (HCC)   Chronic diastolic HF (heart failure) (HCC)   Stage 3a chronic kidney disease (CKD) (HCC)     Assessment and Plan: * Sepsis (HCC) -Much improved sepsis physiology  - Right lower extremity cellulitis -with open wound, negative for DVT POA: Patient meets sepsis due to cellulitis-  w leukocytosis WBC 18, lactic acid 2.3, creatinine 1.59, RR 21 -Immunocompromised -patient received Rocephin  in ED, bon Iv Zyvox  -Continue IV fluid resuscitation based on sepsis protocol - Blood cultures are growing gram-positive cocci Repeating blood cultures today 08/19/2024 - Continue IV antibiotics of Zyvox   Chronic anticoagulation - Chronically anticoagulated for chronic DVT on Coumadin   - INR therapeutic at 3.2 >> 3.5 -Will dose per pharmacy  Weakness - Consulting PT OT for evaluation recommendations  Personal history of DVT (deep vein thrombosis) Continue Coumadin  Left lower extremity Doppler positive for persistent chronic DVT  Stage 3a chronic kidney disease (CKD) (HCC) -Improving - Monitoring closely, avoiding nephrotoxins, avoiding hypotension -Mildly elevated BUN/creatinine from baseline  Lab Results  Component Value Date   CREATININE 1.39 (H) 08/19/2024   CREATININE 1.59 (H) 08/18/2024   CREATININE 1.52 (H) 08/17/2024  estimated creatinine clearance is 48.2 mL/min (A) (by C-G formula based on SCr of 1.59 mg/dL (H)).   Chronic diastolic HF (heart failure) (HCC) - Will monitor I's and O's, daily weights -Unfortunately patient needs fluid resuscitation due to meeting sepsis criteria -Will monitor closely for volume overload, resuming his home medication of 80 mg Lasix  in a.m. -Last echo March 2023 EGF 55-60% normal LV function normal RV function valves within normal limits, no LVH  Class 2 severe obesity due to excess calories with serious comorbidity and body mass index (BMI) of 35.0 to 35.9 in adult  (HCC) Height 5'9, weight 99.8 kg, BMI 32.49 kg/m -Discussed with patient, advised follow-up with PCP regarding outpatient weight loss programs  OSA (obstructive sleep apnea) Monitor closely, nightly CPAP  Dyspnea on exertion - As needed DuoNeb bronchodilators -Continue home inhalers  Primary osteoarthritis of both feet - Continue current meds, With holding immunosuppressants  History of seizure disorder - Stable, continue home medications  History of COPD No signs of exacerbation, as needed DuoNeb bronchodilators, continue home dose inhalers  COPD with acute exacerbation (HCC) - Currently stable, continue inhalers as needed, supplemental oxygen  as needed to maintain O2 sat greater than 90%  DVT (deep venous thrombosis) (HCC) - Chronically on Coumadin , bilateral lower extremity Doppler studies still revealing left lower extremity DVT, right cellulitic changes -INR therapeutic at 3.2  Malignant neoplasm of prostate [facility]) - Follow-up with urologist as outpatient  Rheumatoid arthritis (HCC) Stable continue meds  GERD Continue PPI     ---------------------------------------------------------------------------------------------------------------------------------------------- Nutritional status:  The patient's BMI is: Body mass index is 33.86 kg/m. I agree with the assessment and plan as outlined  Skin Assessment: I have examined the patient's skin and I agree with the wound assessment as performed by wound care team As outlined belowe:     ---------------------------------------------------------------------------------------------------------------------------------------------------- Cultures; Blood Cultures x 2 >> growing gram-positive cocci 08/18/2024-ABX Zyvox   ---------------------------------------------------------------------------------------------------------------------------------------------  DVT prophylaxis:  SCDs Start: 08/18/24 1210   Code  Status:   Code Status: Full Code  Family Communication: No family member present at bedside-  -Advance care planning has been discussed.   Admission status:   Status is: Inpatient Remains inpatient appropriate because: Needing IV antibiotics,   Disposition: From  - home             Planning for discharge in 1-2 days   Procedures:   No admission procedures for hospital encounter.   Antimicrobials:  Anti-infectives (From admission, onward)    Start     Dose/Rate Route Frequency Ordered Stop   08/19/24 0000  clindamycin  (CLEOCIN ) 300 MG capsule        300 mg Oral 3 times daily 08/19/24 1048 08/25/24 2359   08/18/24 1400  linezolid  (ZYVOX ) IVPB 600 mg        600 mg 300 mL/hr over 60 Minutes Intravenous Every 12 hours 08/18/24 1215 08/25/24 0959   08/18/24 1015  cefTRIAXone  (ROCEPHIN ) 2 g in sodium chloride  0.9 % 100 mL IVPB        2 g 200 mL/hr over 30 Minutes Intravenous Once 08/18/24 1012 08/18/24 1110        Medication:   allopurinol   100 mg Oral BID   budesonide -glycopyrrolate -formoterol   2 puff Inhalation BID   diclofenac  Sodium  2 g Topical QID   furosemide   80 mg Oral Daily   leflunomide   20 mg Oral Daily   mupirocin  ointment   Topical TID   oxybutynin   10 mg Oral Daily   pantoprazole   40 mg Oral Daily   phenytoin   100 mg Oral Daily   And   phenytoin   200 mg Oral QHS   rosuvastatin   10 mg Oral Daily   sodium chloride  flush  3 mL Intravenous Q12H   sodium chloride  flush  3 mL Intravenous Q12H   spironolactone   50 mg Oral Daily    acetaminophen  **OR** acetaminophen , bisacodyl , hydrALAZINE , HYDROmorphone  (DILAUDID ) injection, ipratropium, levalbuterol , ondansetron  **OR** ondansetron  (ZOFRAN ) IV, oxyCODONE , senna-docusate, sodium phosphate , traZODone    Objective:   Vitals:   08/18/24 1700 08/18/24 2155 08/19/24 0429 08/19/24 0722  BP: 121/64 (!) 160/78 (!) 161/82   Pulse: 80 78 68   Resp: 20 18 18    Temp: 98.1 F (36.7 C) 98.2 F (36.8 C) 98 F (36.7  C)   TempSrc: Oral Oral Axillary   SpO2: 97% 92% 100% 94%  Weight:   104 kg     Intake/Output Summary (Last 24 hours) at 08/19/2024 1051 Last data filed at 08/18/2024 2010 Gross per 24 hour  Intake 250 ml  Output --  Net 250 ml   Filed Weights   08/19/24 0429  Weight: 104 kg     Physical examination:   General:  AAO x 3,  cooperative, no distress;   HEENT:  Normocephalic, PERRL, otherwise with in Normal limits   Neuro:  CNII-XII intact. , normal motor and sensation, reflexes intact   Lungs:   Clear to auscultation BL, Respirations unlabored,  No wheezes / crackles  Cardio:    S1/S2, RRR, No murmure, No Rubs or Gallops   Abdomen:  Soft, non-tender, bowel sounds active all four quadrants, no guarding or peritoneal signs.  Muscular  skeletal:  Right lower extremity +2 edema, with lateral wound, dressing in place Limited exam -global generalized weaknesses - in bed, able to move all 4 extremities,   2+ pulses,  symmetric, No pitting edema  Skin:  Dry, warm to touch, right lateral lower extremity wound dressing in place  Wounds: Right lateral lower extremity wound -please see nurses documentation       ------------------------------------------------------------------------------------------------------------------------------------------    LABs:     Latest Ref Rng & Units 08/19/2024    4:15 AM 08/18/2024    9:30 AM 08/17/2024   10:58 AM  CBC  WBC 4.0 - 10.5 K/uL 11.5  18.0  8.0   Hemoglobin 13.0 - 17.0 g/dL 86.0  85.6  85.0   Hematocrit 39.0 - 52.0 % 43.1  43.4  45.1   Platelets 150 - 400 K/uL 129  142  152       Latest Ref Rng & Units 08/19/2024    4:15 AM 08/18/2024    9:30 AM 08/17/2024   10:58 AM  CMP  Glucose 70 - 99 mg/dL 899  878  889   BUN 8 - 23 mg/dL 14  18  18    Creatinine 0.61 - 1.24 mg/dL 8.60  8.40  8.47   Sodium 135 - 145 mmol/L 140  140  140   Potassium 3.5 - 5.1 mmol/L 3.6  3.5  4.1   Chloride 98 - 111 mmol/L 105  100  102   CO2 22 - 32 mmol/L  26  26  28    Calcium  8.9 - 10.3 mg/dL 8.6  9.2  9.3   Total Protein 6.5 - 8.1 g/dL  6.9  6.8   Total Bilirubin 0.0 - 1.2 mg/dL  1.0  0.9   Alkaline Phos 38 - 126 U/L  58  52   AST  15 - 41 U/L  29  30   ALT 0 - 44 U/L  22  23        Micro Results Recent Results (from the past 240 hours)  Blood Culture (routine x 2)     Status: None (Preliminary result)   Collection Time: 08/18/24  9:30 AM   Specimen: BLOOD  Result Value Ref Range Status   Specimen Description BLOOD BLOOD RIGHT ARM  Final   Special Requests   Final    Blood Culture adequate volume BOTTLES DRAWN AEROBIC AND ANAEROBIC   Culture   Final    NO GROWTH < 24 HOURS Performed at [facility], [address] [place], [place] [address]    Report Status PENDING  Incomplete  Blood Culture (routine x 2)     Status: None (Preliminary result)   Collection Time: 08/18/24  9:35 AM   Specimen: BLOOD  Result Value Ref Range Status   Specimen Description BLOOD RIGHT ANTECUBITAL  Final   Special Requests   Final    BOTTLES DRAWN AEROBIC AND ANAEROBIC Blood Culture results may not be optimal due to an inadequate volume of blood received in culture bottles   Culture  Setup Time   Final    ANAEROBIC BOTTLE ONLY GRAM POSITIVE COCCI Gram Stain Report Called to,Read Back By and Verified With: D. MISS RN 08/19/24 @0726  BY J. WHITE Performed at [facility], [address], [place], [place] [address]    Culture [facility] POSITIVE COCCI  Final   Report Status PENDING  Incomplete    Radiology Reports No results found.  SIGNED: [name], MD, FHM. FAAFP. Jolynn Pack - [facility] Time spent - 55 min.  In seeing, evaluating and examining the patient. Reviewing medical records, labs, drawn plan of care. [facility],  Pager (please use [email or web address] to page/ text) Please use Epic Secure Chat for non-urgent communication (7AM-7PM)  If 7PM-7AM, please contact night-coverage [email or web address], 08/19/2024, 10:51 AM

## 2024-08-19 NOTE — Plan of Care (Signed)
  Problem: Fluid Volume: Goal: Hemodynamic stability will improve Outcome: Progressing   Problem: Clinical Measurements: Goal: Diagnostic test results will improve Outcome: Progressing Goal: Signs and symptoms of infection will decrease Outcome: Progressing   Problem: Respiratory: Goal: Ability to maintain adequate ventilation will improve Outcome: Progressing   Problem: Education: Goal: Knowledge of General Education information will improve Description: Including pain rating scale, medication(s)/side effects and non-pharmacologic comfort measures Outcome: Progressing   Problem: Clinical Measurements: Goal: Ability to maintain clinical measurements within normal limits will improve Outcome: Progressing

## 2024-08-19 NOTE — Progress Notes (Signed)
 Office Visit Note  Patient: [name]             Date of Birth: May 17, 1951           MRN: [id number]             PCP: Marvine Rush, MD Referring: Marvine Rush, MD Visit Date: 09/02/2024 Occupation: @GUAROCC @  Subjective:  Discuss medication options   History of Present Illness: [name] is a 73 y.o. male with history of rheumatoid arthritis and gout.  Patient is currently prescribed Actemra  4mg /kg IV infusions every 4 weeks and Arava  20 mg 1 tablet by mouth daily.  His last Actemra  infusion was administered on 08/17/2024.  He presented to the emergency department on 08/18/2024 at which time he was diagnosed with sepsis due to cellulitis--MRSA bacteremia--discharged on 08/23/2024.  He continues to have a PICC line and is receiving IV antibiotics by a home health nurse.  He is due for his last dose of antibiotics tomorrow and will be seeing ID, Dr. Dea, on 09/09/24.  Patient states he has been holding arava  for the past 2 months.  He states the wound is still not healed but there are no signs of infection.  He denies any signs or symptoms of a gout flare.  He has remained on allopurinol  200 mg daily.     Activities of Daily Living:  Patient reports morning stiffness for 0 minutes  Patient Denies nocturnal pain.  Difficulty dressing/grooming: Denies Difficulty climbing stairs: Denies Difficulty getting out of chair: Denies Difficulty using hands for taps, buttons, cutlery, and/or writing: Denies  Review of Systems  Constitutional:  Positive for fatigue. Negative for night sweats.  HENT:  Negative for mouth sores, mouth dryness and nose dryness.   Eyes:  Negative for redness and dryness.  Respiratory:  Positive for cough (COPD). Negative for hemoptysis, shortness of breath and difficulty breathing.   Cardiovascular:  Negative for chest pain, palpitations, hypertension, irregular heartbeat and swelling in legs/feet.  Gastrointestinal:  Negative for constipation and  diarrhea.  Endocrine: Negative for increased urination.  Genitourinary:  Negative for painful urination.  Musculoskeletal:  Negative for joint pain, joint pain, joint swelling, myalgias, muscle weakness, morning stiffness, muscle tenderness and myalgias.  Skin:  Negative for color change, rash, hair loss, nodules/bumps, skin tightness, ulcers and sensitivity to sunlight.  Allergic/Immunologic: Negative for susceptible to infections.  Neurological:  Negative for dizziness, fainting, memory loss, night sweats and weakness.  Hematological:  Negative for swollen glands.  Psychiatric/Behavioral:  Negative for depressed mood and sleep disturbance. The patient is not nervous/anxious.     PMFS History:  Patient Active Problem List   Diagnosis Date Noted   MRSA bacteremia 08/20/2024   MRSA infection 08/20/2024   Sepsis (HCC) 08/18/2024   Acute respiratory failure with hypoxia (HCC) 01/23/2023   Stage 3a chronic kidney disease (CKD) (HCC) 03/11/2022   Bronchiectasis without complication (HCC) 03/26/2021   Incontinence when straining, male 03/20/2021   Erectile dysfunction after radical prostatectomy 09/08/2020   OAB (overactive bladder) 02/23/2020   Class 2 severe obesity due to excess calories with serious comorbidity and body mass index (BMI) of 35.0 to 35.9 in adult [facility])    Chronic diastolic HF (heart failure) (HCC)    OSA (obstructive sleep apnea) 03/10/2019   Dyspnea on exertion 10/22/2017   History of seizure disorder 02/27/2017   Primary osteoarthritis of both hands 02/27/2017   Primary osteoarthritis of both feet 02/27/2017   Idiopathic gout of multiple sites 02/27/2017  History of prostate cancer 12/29/2016   Primary osteoarthritis of both knees 12/29/2016   Chronic idiopathic gout involving toe without tophus 12/29/2016   History of COPD 12/29/2016   Plantar pustular psoriasis 12/29/2016   DVT (deep venous thrombosis) (HCC) 10/31/2016   COPD with acute exacerbation (HCC)  10/31/2016   CHF (congestive heart failure) (HCC) 10/31/2016   Prostate cancer (HCC) 08/30/2015   Malignant neoplasm of prostate (HCC) 07/04/2015   Chest pain at rest 07/05/2014   Weakness 07/05/2014   Complicated postphlebitic syndrome 11/16/2013   Personal history of DVT (deep vein thrombosis) 05/18/2013   Chronic anticoagulation 05/18/2013   Diverticulosis of colon without hemorrhage 05/18/2013   Rheumatoid arthritis (HCC) 05/18/2013   Seizure disorder (HCC) 05/18/2013   History of colonic polyps 05/18/2013   GERD 05/08/2010    Past Medical History:  Diagnosis Date   Arthritis    RA   Asthma    CHF (congestive heart failure) (HCC)    pt denies    Clotting disorder (HCC)    DVT both legs    COPD (chronic obstructive pulmonary disease) (HCC)    DDD (degenerative disc disease), cervical    with UE's paresthesias   Diverticulosis    DVT, lower extremity (HCC)    bilat   Eye abnormality    right eye drifts has difficulty focusing 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 Unknown origin.   Shortness of breath dyspnea    exertion    Sleep apnea    cpap    Tubular adenoma of colon 04/2008    Family History  Problem Relation Age of Onset   Alzheimer's disease Mother    Skin cancer Father    Stomach cancer Father    Heart attack Father    Breast cancer Sister    Heart attack Sister    Stroke Sister    Bladder Cancer Sister    Heart murmur Sister    Esophageal cancer Brother    Throat cancer Brother    Stomach cancer Brother    Lung cancer Brother    Colon cancer Brother    Heart attack Brother    Heart attack Brother     Colon cancer Brother    Colon polyps Neg Hx    Rectal cancer Neg Hx    Past Surgical History:  Procedure Laterality Date   CHOLECYSTECTOMY     CIRCUMCISION     COLONOSCOPY     CYSTOSCOPY WITH LITHOLAPAXY N/A 03/17/2019   Procedure: CYSTOSCOPY WITH LITHOLAPAXY AND REMOVAL OF FOREIGN BODY;  Surgeon: Sherrilee Belvie CROME, MD;  Location: [facility];  Service: Urology;  Laterality: N/A;   DOPPLER ECHOCARDIOGRAPHY N/A 01-21-2012   TECHNICALLY DIFFICULT. MILD CONCENTRIC LV HYPERTROPHY. LV CAVITY IS SMALL.. EF=> 55%. TRANSMITRAL SPECTRAL FLOW PATTREN IS SUGGESTIVE OF IMPAIRED LV RELAXATION. RV SYSTOLIC PRESSURE IS . LEFT ATRIAL SIZE IS NORMAL. AV APPEARS MILDLY SCLEROTIC. NO SIGN VALVE DISEASE NOTED.   LEFT HEART CATH AND CORONARY ANGIOGRAPHY N/A 03/12/2022   Procedure: LEFT HEART CATH AND  CORONARY ANGIOGRAPHY;  Surgeon: Wonda Sharper, MD;  Location: [facility] INVASIVE CV LAB;  Service: Cardiovascular;  Laterality: N/A;   LYMPHADENECTOMY Bilateral 08/30/2015   Procedure: PELVIC LYMPHADENECTOMY;  Surgeon: Belvie LITTIE Clara, MD;  Location: [facility];  Service: Urology;  Laterality: Bilateral;   NUCLEAR STRESS TEST N/A 01-21-2012   NORMAL PATTERN OF PERFUSION IN ALL REGIONS. POST STRESS LV SIZE IS NORMAL. NO EVIDENCE OF INDUCIBLE ISCHEMIA. EF 54%.   POLYPECTOMY     PROSTATE BIOPSY     ROBOT ASSISTED LAPAROSCOPIC RADICAL PROSTATECTOMY N/A 08/30/2015   Procedure: ROBOTIC ASSISTED LAPAROSCOPIC RADICAL PROSTATECTOMY;  Surgeon: Belvie LITTIE Clara, MD;  Location: [facility];  Service: Urology;  Laterality: N/A;   TEE WITHOUT CARDIOVERSION N/A 08/23/2024   Procedure: ECHOCARDIOGRAM, TRANSESOPHAGEAL;  Surgeon: Alvan Dorn FALCON, MD;  Location: [facility];  Service: Endoscopy;  Laterality: N/A;   URINARY SPHINCTER IMPLANT N/A 03/20/2021   Procedure: ARTIFICIAL URINARY SPHINCTER CYSTOSCOPY;  Surgeon: Gaston Hamilton, MD;  Location: [facility];  Service: Urology;  Laterality: N/A;  REQUESTING 90 MINS   US  VENOUS LOWER EXT Right  03/07/11   PERSISTENT DVT IN RIGHT LOWER EXT.WITH PERSISTANT VISUALIZATION OF HYPOECHOIC THROMBUS WITHIN THE FEMORAL, PROFUNDA FEMORAL AND POPLITEAL VEINS. WHEN COMPARED TO PREVIOUS, CLOT IS NO LONGER IDENTIFIED WITH IN THE RIGHT CFV.   Social History   Social History Narrative   Not on file   Immunization History  Administered Date(s) Administered   Fluad Quad(high Dose 65+) 09/30/2019   INFLUENZA, HIGH DOSE SEASONAL PF 10/05/2018, 10/10/2021   Influenza,inj,Quad PF,6+ Mos 08/31/2015   Influenza-Unspecified 09/30/2016, 08/22/2017   Moderna Sars-Covid-2 Vaccination 01/21/2020, 02/18/2020, 10/25/2020, 10/10/2021   Pneumococcal Conjugate-13 10/05/2018   Pneumococcal-Unspecified 12/30/2012   Td 07/05/1996     Objective: Vital Signs: BP 139/80 (BP Location: Left Arm, Patient Position: Sitting, Cuff Size: Normal)   Pulse 67   Resp 14   Ht 5' 9 (1.753 m)   Wt 220 lb 6.4 oz (100 kg)   BMI 32.55 kg/m    Physical Exam Vitals and nursing note reviewed.  Constitutional:      Appearance: He is well-developed.  HENT:     Head: Normocephalic and atraumatic.  Eyes:     Conjunctiva/sclera: Conjunctivae normal.     Pupils: Pupils are equal, round, and reactive to light.  Cardiovascular:     Rate and Rhythm: Normal rate and regular rhythm.     Heart sounds: Normal heart sounds.  Pulmonary:     Effort: Pulmonary effort is normal.     Breath sounds: Normal breath sounds.  Abdominal:     General: Bowel sounds are normal.     Palpations: Abdomen is soft.  Musculoskeletal:     Cervical back: Normal range of motion and neck supple.  Skin:    General: Skin is warm and dry.     Capillary Refill: Capillary refill takes less than 2 seconds.  Neurological:     Mental Status: He is alert and oriented to person, place, and time.  Psychiatric:        Behavior: Behavior normal.      Musculoskeletal Exam: C-spine has limited ROM with lateral rotation. Thoracic kyphosis noted.  Shoulder joints  good range of motion.  Elbow joints have good range of motion no tenderness along the joint line.  Wrist joints have good range of motion with no tenderness or synovitis.  Synovial thickening of the right first MCP joint but no synovitis noted.  PIP and DIP thickening noted.  Complete fist  formation bilaterally.  Knee joints have good range of motion no warmth or effusion.  Significant pedal edema noted bilateral lower extremities, right greater than left.  Ace wrap wrapped around the right lower leg.  CDAI Exam: CDAI Score: -- Patient Global: --; Provider Global: -- Swollen: --; Tender: -- Joint Exam 09/02/2024   No joint exam has been documented for this visit   There is currently no information documented on the homunculus. Go to the Rheumatology activity and complete the homunculus joint exam.  Investigation: No additional findings.  Imaging: US  EKG SITE RITE Result Date: 08/23/2024 If Site Rite image not attached, placement could not be confirmed due to current cardiac rhythm.  ECHO TEE Result Date: 08/23/2024    TRANSESOPHOGEAL ECHO REPORT   Patient Name:   [name] Date of Exam: 08/23/2024 Medical Rec #:  [id number]        Height:       69.0 in Accession #:    [id number]       Weight:       222.7 lb Date of Birth:  1951-02-05       BSA:          2.162 m Patient Age:    72 years         BP:           144/74 mmHg Patient Gender: M                HR:           79 bpm. Exam [facility] Procedure: Transesophageal Echo, Cardiac Doppler and Color Doppler (Both            Spectral and Color Flow Doppler were utilized during procedure). Indications:    Bacteremia R78.81  History:        Patient has prior history of Echocardiogram examinations, most                 recent 08/19/2024. CHF, COPD; Risk Factors:Sleep Apnea and                 Dyslipidemia. Prostate cancer (HCC).  Sonographer:    Aida Pizza RCS Referring Phys: [id number] LORETTE CINDERELLA KAPUR PROCEDURE: The transesophogeal  probe was passed without difficulty through the esophogus of the patient. Sedation performed by performing physician. The patient developed no complications during the procedure.  IMPRESSIONS  1. Left ventricular ejection fraction, by estimation, is 60 to 65%. The left ventricle has normal function. The left ventricle has no regional wall motion abnormalities.  2. Right ventricular systolic function is normal. The right ventricular size is normal.  3. No left atrial/left atrial appendage thrombus was detected. The LAA emptying velocity was 60 cm/s.  4. The mitral valve is normal in structure. No evidence of mitral valve regurgitation. No evidence of mitral stenosis.  5. The aortic valve is tricuspid. There is mild calcification of the aortic valve. There is mild thickening of the aortic valve. Aortic valve regurgitation is not visualized. No aortic stenosis is present.  6. Some resistance advancing the probe in the trangastric views, limited views from transgastric position. Conclusion(s)/Recommendation(s): No evidence of vegetation/infective endocarditis on this transesophageael echocardiogram. FINDINGS  Left Ventricle: Left ventricular ejection fraction, by estimation, is 60 to 65%. The left ventricle has normal function. The left ventricle has no regional wall motion abnormalities. The left ventricular internal cavity size was normal in size. Right Ventricle: The right ventricular size is normal. Right vetricular wall thickness  was not well visualized. Right ventricular systolic function is normal. Left Atrium: Left atrial size was normal in size. No left atrial/left atrial appendage thrombus was detected. The LAA emptying velocity was 60 cm/s. Right Atrium: Right atrial size was normal in size. Pericardium: There is no evidence of pericardial effusion. Mitral Valve: The mitral valve is normal in structure. No evidence of mitral valve regurgitation. No evidence of mitral valve stenosis. Tricuspid Valve: The  tricuspid valve is normal in structure. Tricuspid valve regurgitation is not demonstrated. No evidence of tricuspid stenosis. Aortic Valve: The aortic valve is tricuspid. There is mild calcification of the aortic valve. There is mild thickening of the aortic valve. Aortic valve regurgitation is not visualized. No aortic stenosis is present. Pulmonic Valve: The pulmonic valve was normal in structure. Pulmonic valve regurgitation is not visualized. No evidence of pulmonic stenosis. Aorta: The aortic root is normal in size and structure. IAS/Shunts: No atrial level shunt detected by color flow Doppler.   AORTA Ao Root diam: 3.50 cm Dorn Ross MD Electronically signed by Dorn Ross MD Signature Date/Time: 08/23/2024/2:34:09 PM    Final    ECHOCARDIOGRAM COMPLETE Result Date: 08/19/2024    ECHOCARDIOGRAM REPORT   Patient Name:   [name] Date of Exam: 08/19/2024 Medical Rec #:  [id number]        Height:       69.0 in Accession #:    [id number]       Weight:       229.3 lb Date of Birth:  [birth year]       BSA:          2.190 m Patient Age:    72 years         BP:           141/71 mmHg Patient Gender: M                HR:           73 bpm. [facility] Procedure: 2D Echo, Cardiac Doppler, Color Doppler and Intracardiac            Opacification Agent (Both Spectral and Color Flow Doppler were            utilized during procedure). Indications:    Bacteremia  History:        Patient has prior history of Echocardiogram examinations.                 Signs/Symptoms:Bacteremia.  Sonographer:    Vella Key Referring Phys: [phone] SEYED A SHAHMEHDI  Sonographer Comments: Technically difficult study due to poor echo windows. IMPRESSIONS  1. Poor Echo windows for evaluation of infective endocarditis. Valves not well visualized. Consider TEE if clinically indicated.  2. No LV thrombus by Definity . Left ventricular ejection fraction, by estimation, is 70 to 75%. The left ventricle has hyperdynamic  function. The left ventricle has no regional wall motion abnormalities. Left ventricular diastolic parameters are consistent with Grade I diastolic dysfunction (impaired relaxation).  3. Right ventricular systolic function was not well visualized. The right ventricular size is normal. Tricuspid regurgitation signal is inadequate for assessing PA pressure.  4. The mitral valve was not well visualized. No evidence of mitral valve regurgitation. No evidence of mitral stenosis.  5. The aortic valve was not well visualized. Aortic valve regurgitation is not visualized. No aortic stenosis is present. FINDINGS  Left Ventricle: No LV thrombus by Definity . Left ventricular ejection fraction, by estimation, is 70  to 75%. The left ventricle has hyperdynamic function. The left ventricle has no regional wall motion abnormalities. Definity  contrast agent was given IV  to delineate the left ventricular endocardial borders. Strain was performed and the global longitudinal strain is indeterminate. The left ventricular internal cavity size was normal in size. There is no left ventricular hypertrophy. Left ventricular diastolic parameters are consistent with Grade I diastolic dysfunction (impaired relaxation). Normal left ventricular filling pressure. Right Ventricle: The right ventricular size is normal. No increase in right ventricular wall thickness. Right ventricular systolic function was not well visualized. Tricuspid regurgitation signal is inadequate for assessing PA pressure. Left Atrium: Left atrial size was normal in size. Right Atrium: Right atrial size was normal in size. Pericardium: There is no evidence of pericardial effusion. Mitral Valve: The mitral valve was not well visualized. No evidence of mitral valve regurgitation. No evidence of mitral valve stenosis. Tricuspid Valve: The tricuspid valve is not well visualized. Tricuspid valve regurgitation is not demonstrated. No evidence of tricuspid stenosis. Aortic Valve:  The aortic valve was not well visualized. Aortic valve regurgitation is not visualized. No aortic stenosis is present. Pulmonic Valve: The pulmonic valve was not well visualized. Pulmonic valve regurgitation is not visualized. No evidence of pulmonic stenosis. Aorta: The aortic root is normal in size and structure. Venous: The inferior vena cava was not well visualized. IAS/Shunts: No atrial level shunt detected by color flow Doppler. Additional Comments: 3D was performed not requiring image post processing on an independent workstation and was indeterminate.   LV Volumes (MOD) LV vol d, MOD A2C: 48.6 ml Diastology LV vol d, MOD A4C: 36.0 ml LV e' medial:    8.27 cm/s LV vol s, MOD A2C: 8.1 ml  LV E/e' medial:  8.1 LV vol s, MOD A4C: 6.0 ml  LV e' lateral:   6.09 cm/s LV SV MOD A2C:     40.5 ml LV E/e' lateral: 11.0 LV SV MOD A4C:     36.0 ml LV SV MOD BP:      37.0 ml RIGHT VENTRICLE RV Basal diam:  3.20 cm RV S prime:     11.60 cm/s TAPSE (M-mode): 3.6 cm LEFT ATRIUM           Index        RIGHT ATRIUM           Index LA Vol (A2C): 53.0 ml 24.21 ml/m  RA Area:     18.30 cm LA Vol (A4C): 62.5 ml 28.55 ml/m  RA Volume:   53.80 ml  24.57 ml/m  AORTIC VALVE LVOT Vmax:   75.00 cm/s LVOT Vmean:  55.100 cm/s LVOT VTI:    0.176 m MITRAL VALVE MV Area (PHT): 4.83 cm    SHUNTS MV Decel Time: 157 msec    Systemic VTI: 0.18 m MV E velocity: 66.90 cm/s MV A velocity: 76.80 cm/s MV E/A ratio:  0.87 Vishnu Priya Mallipeddi Electronically signed by Diannah Late Mallipeddi Signature Date/Time: 08/19/2024/4:45:53 PM    Final    US  ARTERIAL ABI (SCREENING LOWER EXTREMITY) Result Date: 08/19/2024 CLINICAL DATA:  Edema. EXAM: NONINVASIVE PHYSIOLOGIC VASCULAR STUDY OF BILATERAL LOWER EXTREMITIES TECHNIQUE: Evaluation of both lower extremities were performed at rest, including calculation of ankle-brachial indices with single level pressure measurements and doppler recording. COMPARISON:  None available. FINDINGS: Right ABI:   1.03 Left ABI:  1.14 Right Lower Extremity:  Normal arterial waveforms at the ankle. Left Lower Extremity:  Normal arterial waveforms at the ankle. 1.0-1.4 Normal IMPRESSION:  No evidence of significant lower extremity peripheral arterial disease Electronically Signed   By: Aliene Lloyd M.D.   On: 08/19/2024 12:58   US  Venous Img Lower Bilateral Addendum Date: 08/18/2024 ADDENDUM REPORT: 08/18/2024 11:38 ADDENDUM: Critical Value/emergent results were called by telephone at the time of interpretation by Dr Duwaine Severs on August 18, 2024 to provider Dr. Freddi who verbally acknowledged these results. Electronically Signed   By: Megan  Zare M.D.   On: 08/18/2024 11:38   Result Date: 08/18/2024 CLINICAL DATA:  Chronic lower extremity edema EXAM: Bilateral LOWER EXTREMITY VENOUS DOPPLER ULTRASOUND TECHNIQUE: Gray-scale sonography with compression, as well as color and duplex ultrasound, were performed to evaluate the deep venous system(s) from the level of the common femoral vein through the popliteal and proximal calf veins. COMPARISON:  July 26, 2024 FINDINGS: VENOUS There is a partially occlusive thrombus within the normal-sized left popliteal vein with lack of complete compressibility. Otherwise there is normal compressibility of the common femoral, superficial femoral, and popliteal veins, as well as the remainder of the visualized calf veins. Visualized portions of profunda femoral vein and great saphenous vein unremarkable. No filling defects to suggest DVT on grayscale or color Doppler imaging within the remainder of the vessels. Doppler waveforms show normal direction of venous flow, normal respiratory plasticity and response to augmentation. OTHER Bilateral lower extremity soft tissue edema distal to the knee. Limitations: none IMPRESSION: Partially occlusive left popliteal vein thrombus with lack of complete compressibility. No DVT on the right. Soft tissue edema in bilateral lower extremity distal to the  knee. Electronically Signed: By: Megan  Zare M.D. On: 08/18/2024 11:32   DG Chest Port 1 View Result Date: 08/18/2024 CLINICAL DATA:  Fever. EXAM: PORTABLE CHEST 1 VIEW COMPARISON:  December 26, 2023. FINDINGS: Stable cardiomediastinal silhouette. Stable elevated left hemidiaphragm. No definite acute pulmonary disease is noted. Bony thorax is unremarkable. IMPRESSION: No active disease. Electronically Signed   By: Lynwood Landy Raddle M.D.   On: 08/18/2024 09:36   DG Tibia/Fibula Right Result Date: 08/18/2024 CLINICAL DATA:  Right lower extremity pain and swelling for several weeks. EXAM: RIGHT TIBIA AND FIBULA - 2 VIEW COMPARISON:  None Available. FINDINGS: There is no evidence of fracture or other focal bone lesions. Soft tissues are unremarkable. IMPRESSION: Negative. Electronically Signed   By: Lynwood Landy Raddle M.D.   On: 08/18/2024 09:34    Recent Labs: Lab Results  Component Value Date   WBC 7.0 08/23/2024   HGB 13.7 08/23/2024   PLT 161 08/23/2024   NA 141 08/23/2024   K 4.2 08/23/2024   CL 108 08/23/2024   CO2 27 08/23/2024   GLUCOSE 108 (H) 08/23/2024   BUN 16 08/23/2024   CREATININE 1.23 08/23/2024   BILITOT 1.0 08/18/2024   ALKPHOS 58 08/18/2024   AST 29 08/18/2024   ALT 22 08/18/2024   PROT 6.9 08/18/2024   ALBUMIN 3.9 08/18/2024   CALCIUM  9.0 08/23/2024   GFRAA >60 09/20/2020   QFTBGOLDPLUS Negative 02/02/2024    Speciality Comments: ACTEMRA  4mg /kg x 4 weeks PPD negative 01/02/17  Procedures:  No procedures performed Allergies: Orencia  [abatacept ], Carbamazepine, Celecoxib, Cephalexin, Enbrel [etanercept], Humira [adalimumab], Levofloxacin , Sulfa antibiotics, and Sulfasalazine    Assessment / Plan:     Visit Diagnoses: Rheumatoid arthritis involving multiple sites with positive rheumatoid factor (HCC): He was last seen in the office on 01/26/2024.  He has no synovitis on examination today.  He has not had any signs or symptoms of a rheumatoid arthritis flare.  His  symptoms of rheumatoid arthritis have been well-controlled on Arava  20 mg 1 tablet daily and Actemra  4 mg/kg IV infusions every 4 weeks.  His most recent Actemra  infusion was administered on 08/17/2024.  He has been off of Abreva for the past 2 months due to experiencing cellulitis affecting the right lower leg.  He was initially treated outpatient with amoxicillin  but unfortunately he ended up with MRSA bacteremia requiring hospitalization for IV antibiotics to manage sepsis.  He was discharged on 08/23/2024 but continues to have a PICC line in place and has been receiving IV antibiotics outpatient.  According to the patient and his wife he is due for his last dose of antibiotics tomorrow and will be following up with ID on 09/09/2024.  Discussed that he is not a good candidate to resume IV Actemra  or any other Biologics given recurrent cellulitis and recent sepsis.  Discussed that he will require clearance by ID prior to resuming Arava  once there is no signs of infection and the wound has healed. He will notify us  if he develops any new or worsening symptoms while on Arava  as monotherapy.  He will require close monitoring. He will follow-up in the office in 6 weeks or sooner if needed.  High risk medication use - Holding Arava  20 mg 1 tablet by mouth daily--He will require clearance by ID prior to resuming Arava .  Plan to discontinue Actemra  due to recurrent infections-cellulitis and recent sepsis. Discussed with Dr. Dolphus. He will require close monitoring CBC and CMP were drawn on 08/31/2024. TB Gold negative on 02/02/2024. Referral to nephrology will be placed today.  Sepsis due to cellulitis [facility]): Patient presented to the ED on 08/10/2024 at which time he was diagnosed with cellulitis leading to sepsis.  He was found to have MRSA bacteremia which was treated with antibiotics.  He continues to have a PICC line and has been getting IV antibiotics outpatient since being discharged on 08/23/2024.  According to  the patient he is due for his last dose of IV antibiotics tomorrow.  He will be following up with ID on 09/09/2024.  Discussed that I would not recommend resuming IV Actemra  given recurrent infections as well as recent sepsis. Plan to resume Arava  pending ID clearance.   History of hyperlipidemia: Lipid panel within normal limits on 03/01/2024.  Age-related osteoporosis without current pathological fracture - DEXA updated on 02/19/2023: LFN BMD 0.632 with T-score -2.2.  Statistically significant increase left hip.Lumbar and hip stable. Due update DEXA in February 2026.  Idiopathic chronic gout of multiple sites without tophus - He has not had any signs or symptoms of a gout flare.  He has clinically been doing well taking allopurinol  100 mg 1 tablet by mouth twice daily  Primary osteoarthritis of both hands: PIP and DIP thickening consistent with osteoarthritis of both hands.  No synovitis noted on examination today.  Primary osteoarthritis of both knees: No warmth or effusion noted.  Primary osteoarthritis of both feet: Significant pedal edema noted in bilateral lower extremities.  Other medical conditions are listed as follows:  History of abscessed tooth  History of COPD  History of adenomatous polyp of colon  History of seizure disorder  History of CHF (congestive heart failure)  History of prostate cancer  History of DVT (deep vein thrombosis)  Orders: No orders of the defined types were placed in this encounter.  No orders of the defined types were placed in this encounter.   Follow-Up Instructions: Return in about 6 weeks (  around 10/14/2024) for Rheumatoid arthritis, Gout.   Waddell CHRISTELLA Craze, PA-C  Note - This record has been created using Dragon software.  Chart creation errors have been sought, but may not always  have been located. Such creation errors do not reflect on  the standard of medical care.

## 2024-08-19 NOTE — Plan of Care (Signed)
  Problem: Clinical Measurements: Goal: Diagnostic test results will improve Outcome: Progressing   Problem: Respiratory: Goal: Ability to maintain adequate ventilation will improve Outcome: Progressing   

## 2024-08-20 DIAGNOSIS — N179 Acute kidney failure, unspecified: Secondary | ICD-10-CM

## 2024-08-20 DIAGNOSIS — D696 Thrombocytopenia, unspecified: Secondary | ICD-10-CM

## 2024-08-20 DIAGNOSIS — B9562 Methicillin resistant Staphylococcus aureus infection as the cause of diseases classified elsewhere: Secondary | ICD-10-CM | POA: Diagnosis not present

## 2024-08-20 DIAGNOSIS — E876 Hypokalemia: Secondary | ICD-10-CM

## 2024-08-20 DIAGNOSIS — L03115 Cellulitis of right lower limb: Secondary | ICD-10-CM

## 2024-08-20 DIAGNOSIS — M059 Rheumatoid arthritis with rheumatoid factor, unspecified: Secondary | ICD-10-CM

## 2024-08-20 DIAGNOSIS — N189 Chronic kidney disease, unspecified: Secondary | ICD-10-CM

## 2024-08-20 DIAGNOSIS — A4902 Methicillin resistant Staphylococcus aureus infection, unspecified site: Secondary | ICD-10-CM

## 2024-08-20 LAB — BASIC METABOLIC PANEL WITH GFR
Anion gap: 8 (ref 5–15)
BUN: 14 mg/dL (ref 8–23)
CO2: 25 mmol/L (ref 22–32)
Calcium: 8.5 mg/dL — ABNORMAL LOW (ref 8.9–10.3)
Chloride: 108 mmol/L (ref 98–111)
Creatinine, Ser: 1.35 mg/dL — ABNORMAL HIGH (ref 0.61–1.24)
GFR, Estimated: 55 mL/min — ABNORMAL LOW (ref >60)
Glucose, Bld: 107 mg/dL — ABNORMAL HIGH (ref 70–99)
Potassium: 3.3 mmol/L — ABNORMAL LOW (ref 3.5–5.1)
Sodium: 141 mmol/L (ref 135–145)

## 2024-08-20 LAB — CK: Total CK: 227 U/L (ref 49–397)

## 2024-08-20 LAB — CBC
HCT: 39.6 % (ref 39.0–52.0)
Hemoglobin: 13.3 g/dL (ref 13.0–17.0)
MCH: 30.2 pg (ref 26.0–34.0)
MCHC: 33.6 g/dL (ref 30.0–36.0)
MCV: 89.8 fL (ref 80.0–100.0)
Platelets: 125 K/uL — ABNORMAL LOW (ref 150–400)
RBC: 4.41 MIL/uL (ref 4.22–5.81)
RDW: 15.5 % (ref 11.5–15.5)
WBC: 7.2 K/uL (ref 4.0–10.5)
nRBC: 0 % (ref 0.0–0.2)

## 2024-08-20 LAB — GLUCOSE, CAPILLARY: Glucose-Capillary: 100 mg/dL — ABNORMAL HIGH (ref 70–99)

## 2024-08-20 LAB — PROTIME-INR
INR: 2.5 — ABNORMAL HIGH (ref 0.8–1.2)
Prothrombin Time: 28.2 s — ABNORMAL HIGH (ref 11.4–15.2)

## 2024-08-20 MED ORDER — AMLODIPINE BESYLATE 5 MG PO TABS
10.0000 mg | ORAL_TABLET | Freq: Every day | ORAL | Status: DC
  Administered 2024-08-20 – 2024-08-23 (×4): 10 mg via ORAL
  Filled 2024-08-20 (×4): qty 2

## 2024-08-20 MED ORDER — WARFARIN SODIUM 2 MG PO TABS
3.0000 mg | ORAL_TABLET | Freq: Once | ORAL | Status: AC
  Administered 2024-08-20: 3 mg via ORAL
  Filled 2024-08-20: qty 1

## 2024-08-20 MED ORDER — AMLODIPINE BESYLATE 10 MG PO TABS: 10.0000 mg | ORAL_TABLET | Freq: Every day | ORAL | 1 refills | Status: AC

## 2024-08-20 MED ORDER — WARFARIN - PHARMACIST DOSING INPATIENT: Freq: Every day | Status: DC

## 2024-08-20 MED ORDER — WARFARIN SODIUM 2 MG PO TABS: 4.0000 mg | ORAL_TABLET | Freq: Once | ORAL | Status: DC

## 2024-08-20 NOTE — Progress Notes (Signed)
 PROGRESS NOTE    Patient: [name]                            PCP: Marvine Rush, MD                    DOB: [birth year]            DOA: 08/18/2024 FMW:[id number]             DOS: 08/20/2024, 11:46 AM   LOS: 2 days   Date of Service: The patient was seen and examined on 08/20/2024  Subjective:   The patient was seen and examined this morning, stable acute distress. Hypertensive otherwise hemodynamically stable.  Afebrile, no leukocytosis No issues overnight  Patient and his wife was made aware of new finding of bacteremia. Agree with consultation with infectious disease team and cardiology Plan of care has been discussed in detail-and agreeable   Brief Narrative:   [name] is a 73 year old male with extensive history of rheumatoid arthritis osteoarthritis, chronic DVT on Coumadin , diastolic CHF, COPD, gout, GERD..  Presented to ED with chief complaint of right leg pain and fever.  Reported has been going on for past 2 weeks.  Chronic right lower extremity edema.  Ultrasound 2 weeks ago was negative for DVT.  Otherwise is progressively becoming more erythematous with edema. Tmax at home 100.6.  No other complaints.   ED evaluation: Blood pressure (!) 153/83, pulse 78, temperature 97.9 F (36.6 C), temperature source Oral, resp. rate 17, SpO2 96%.  Abnormal labs: WBC 18, platelets 142, creatinine 1.59, INR 3.2, Cr 2.3>> 1.6  Lower extremity Doppler study: Right soft tissue swelling, cellulitis, left: Partially occlusive left popliteal vein thrombus with lack of complete compressibilit   Patient was given broad-spectrum antibiotics Rocephin , IV fluids requested to be admitted for cellulitis    Assessment & Plan:   Principal Problem:   Sepsis (HCC) Active Problems:   Chronic anticoagulation   Personal history of DVT (deep vein thrombosis)   Weakness   GERD   Rheumatoid arthritis (HCC)   Malignant neoplasm of prostate (HCC)   DVT (deep venous  thrombosis) (HCC)   COPD with acute exacerbation (HCC)   History of COPD   History of seizure disorder   Primary osteoarthritis of both feet   Dyspnea on exertion   OSA (obstructive sleep apnea)   Class 2 severe obesity due to excess calories with serious comorbidity and body mass index (BMI) of 35.0 to 35.9 in adult (HCC)   Chronic diastolic HF (heart failure) (HCC)   Stage 3a chronic kidney disease (CKD) (HCC)   MRSA bacteremia   MRSA infection     Assessment and Plan: * Sepsis (HCC)/bacteremia MRSA bacteremia Source of infection right lower extremity wound/cellulitis  -Improved sepsis physiology, hemodynamically stable - Blood culture from 8/20  positive for Staph aureus - 8/21 Echocardiogram-poor visualization otherwise negative no signs of vegetation or endocarditis - Consult infectious disease team-IV ABX Zyvox  switched to daptomycin  - Repeating blood cultures - Consulting cardiology for transesophageal echocardiogram Likely will be completed Monday  POA: Right lower extremity cellulitis -with open wound, negative for DVT POA: Patient meets sepsis due to cellulitis-  w leukocytosis WBC 18, lactic acid 2.3, creatinine 1.59, RR 21 -Immunocompromised due to rheumatoid arthritis-medication -S/p IV fluid resuscitation, IV antibiotics   Hypokalemia  Monitoring and replating orally according   chronic anticoagulation - Chronically anticoagulated for chronic DVT on  Coumadin   - INR therapeutic at 3.2 >> 3.5 -Will dose per pharmacy  Weakness - Consulting PT OT for evaluation recommendations  Personal history of DVT (deep vein thrombosis) Continue Coumadin  Left lower extremity Doppler positive for persistent chronic DVT  Acute on chronic stage 3a chronic kidney disease (CKD) (HCC) - Continue to improve - Monitoring closely, avoiding nephrotoxins, avoiding hypotension -Mildly elevated BUN/creatinine from baseline  Lab Results  Component Value Date   CREATININE 1.35  (H) 08/20/2024   CREATININE 1.39 (H) 08/19/2024   CREATININE 1.59 (H) 08/18/2024    estimated creatinine clearance is 48.2 mL/min (A) (by C-G formula based on SCr of 1.59 mg/dL (H)).   Chronic diastolic HF (heart failure) (HCC) -Hemodynamically stable - Will monitor I's and O's, daily weights -Unfortunately patient needs fluid resuscitation due to meeting sepsis criteria -Will monitor closely for volume overload, resuming his home medication of 80 mg Lasix  in a.m. -Last echo March 2023 EGF 55-60% normal LV function normal RV function valves within normal limits, no LVH - Resuming oral Lasix   Class 2 severe obesity due to excess calories with serious comorbidity and body mass index (BMI) of 35.0 to 35.9 in adult (HCC) Height 5'9, weight 99.8 kg, BMI 32.49 kg/m -Discussed with patient, advised follow-up with PCP regarding outpatient weight loss programs  OSA (obstructive sleep apnea) Monitor closely, nightly CPAP  Dyspnea on exertion - As needed DuoNeb bronchodilators -Continue home inhalers  Primary osteoarthritis of both feet - Continue current meds, With holding immunosuppressants: Including Tocilizumab    History of seizure disorder -stable continue phenytoin    History of COPD No signs of exacerbation, as needed DuoNeb bronchodilators, continue home dose inhalers  COPD with acute exacerbation (HCC) - Currently stable, continue inhalers as needed, supplemental oxygen  as needed to maintain O2 sat greater than 90%  DVT (deep venous thrombosis) (HCC) - Chronically on Coumadin , bilateral lower extremity Doppler studies still revealing left lower extremity DVT, right cellulitic changes -INR therapeutic at 3.2  Malignant neoplasm of prostate [facility]) - Follow-up with urologist as outpatient  Rheumatoid arthritis (HCC) Holding Tocilizumab    GERD Continue  PPI     ---------------------------------------------------------------------------------------------------------------------------------------------- Nutritional status:  The patient's BMI is: Body mass index is 33.04 kg/m. I agree with the assessment and plan as outlined   ------------------------------------------------------------------------------------------------------------------- Cultures; 08/18/24  Blood Cultures x 2 >> MRSA 08/19/24 repeat blood cultures   8/20 - 8/21 - Zyvox  8/21 - IV Cubicin  ---------------------------------------------------------------------------------------------------------------------------------------------  DVT prophylaxis:  SCDs Start: 08/18/24 1210 warfarin (COUMADIN ) tablet 3 mg   Code Status:   Code Status: Full Code  Family Communication: No family member present at bedside-  -Advance care planning has been discussed.   Admission status:   Status is: Inpatient Remains inpatient appropriate because: Needing IV antibiotics,   Disposition: From  - home             Planning for discharge in 1-2 days   Procedures:   No admission procedures for hospital encounter.   Antimicrobials:  Anti-infectives (From admission, onward)    Start     Dose/Rate Route Frequency Ordered Stop   08/20/24 1600  vancomycin  (VANCOREADY) IVPB 1250 mg/250 mL  Status:  Discontinued        1,250 mg 166.7 mL/hr over 90 Minutes Intravenous Every 24 hours 08/19/24 1442 08/19/24 1554   08/19/24 1645  DAPTOmycin  (CUBICIN ) IVPB 700 mg/160mL premix        8 mg/kg  84 kg (Adjusted) 200 mL/hr over 30 Minutes Intravenous Daily 08/19/24 1554  08/19/24 1530  vancomycin  (VANCOREADY) IVPB 2000 mg/400 mL  Status:  Discontinued        2,000 mg 200 mL/hr over 120 Minutes Intravenous  Once 08/19/24 1441 08/19/24 1554   08/19/24 0000  clindamycin  (CLEOCIN ) 300 MG capsule        300 mg Oral 3 times daily 08/19/24 1048 08/25/24 2359   08/18/24 1400  linezolid  (ZYVOX )  IVPB 600 mg  Status:  Discontinued        600 mg 300 mL/hr over 60 Minutes Intravenous Every 12 hours 08/18/24 1215 08/19/24 1441   08/18/24 1015  cefTRIAXone  (ROCEPHIN ) 2 g in sodium chloride  0.9 % 100 mL IVPB        2 g 200 mL/hr over 30 Minutes Intravenous Once 08/18/24 1012 08/18/24 1110        Medication:   allopurinol   100 mg Oral BID   amLODipine   10 mg Oral Daily   budesonide -glycopyrrolate -formoterol   2 puff Inhalation BID   diclofenac  Sodium  2 g Topical QID   furosemide   80 mg Oral Daily   leflunomide   20 mg Oral Daily   mupirocin  ointment   Topical TID   oxybutynin   10 mg Oral Daily   pantoprazole   40 mg Oral Daily   phenytoin   100 mg Oral Daily   And   phenytoin   200 mg Oral QHS   rosuvastatin   10 mg Oral Daily   sodium chloride  flush  3 mL Intravenous Q12H   sodium chloride  flush  3 mL Intravenous Q12H   spironolactone   50 mg Oral Daily   warfarin  3 mg Oral ONCE-1600   Warfarin - Pharmacist Dosing Inpatient   Does not apply q1600    acetaminophen  **OR** acetaminophen , bisacodyl , hydrALAZINE , HYDROmorphone  (DILAUDID ) injection, ipratropium, levalbuterol , ondansetron  **OR** ondansetron  (ZOFRAN ) IV, oxyCODONE , senna-docusate, sodium phosphate , traZODone    Objective:   Vitals:   08/19/24 2016 08/19/24 2020 08/20/24 0438 08/20/24 0710  BP: (!) 170/83  (!) 171/80   Pulse: 74 (!) 52 72   Resp: 18 18 18    Temp: 98.3 F (36.8 C)  98.7 F (37.1 C)   TempSrc: Oral  Oral   SpO2: 94% 95% 94% 95%  Weight:   101.5 kg     Intake/Output Summary (Last 24 hours) at 08/20/2024 1146 Last data filed at 08/20/2024 0930 Gross per 24 hour  Intake 1143.46 ml  Output --  Net 1143.46 ml   Filed Weights   08/19/24 0429 08/20/24 0438  Weight: 104 kg 101.5 kg     Physical examination:       General:  AAO x 3,  cooperative, no distress;   HEENT:  Normocephalic, PERRL, otherwise with in Normal limits   Neuro:  CNII-XII intact. , normal motor and sensation, reflexes  intact   Lungs:   Clear to auscultation BL, Respirations unlabored,  No wheezes / crackles  Cardio:    S1/S2, RRR, No murmure, No Rubs or Gallops   Abdomen:  Soft, non-tender, bowel sounds active all four quadrants, no guarding or peritoneal signs.  Muscular  skeletal:  Limited exam -global generalized weaknesses - in bed, able to move all 4 extremities,   2+ pulses,  symmetric, RLE +3  pitting edema  Skin:  Dry, warm to touch,    Wounds: Right lateral lower extremity wound -please see nurses documentation       ------------------------------------------------------------------------------------------------------------------------------------------    LABs:     Latest Ref Rng & Units 08/20/2024    5:05 AM 08/19/2024  4:15 AM 08/18/2024    9:30 AM  CBC  WBC 4.0 - 10.5 K/uL 7.2  11.5  18.0   Hemoglobin 13.0 - 17.0 g/dL 86.6  86.0  85.6   Hematocrit 39.0 - 52.0 % 39.6  43.1  43.4   Platelets 150 - 400 K/uL 125  129  142       Latest Ref Rng & Units 08/20/2024    5:05 AM 08/19/2024    4:15 AM 08/18/2024    9:30 AM  CMP  Glucose 70 - 99 mg/dL 892  899  878   BUN 8 - 23 mg/dL 14  14  18    Creatinine 0.61 - 1.24 mg/dL 8.64  8.60  8.40   Sodium 135 - 145 mmol/L 141  140  140   Potassium 3.5 - 5.1 mmol/L 3.3  3.6  3.5   Chloride 98 - 111 mmol/L 108  105  100   CO2 22 - 32 mmol/L 25  26  26    Calcium  8.9 - 10.3 mg/dL 8.5  8.6  9.2   Total Protein 6.5 - 8.1 g/dL   6.9   Total Bilirubin 0.0 - 1.2 mg/dL   1.0   Alkaline Phos 38 - 126 U/L   58   AST 15 - 41 U/L   29   ALT 0 - 44 U/L   22        Micro Results Recent Results (from the past 240 hours)  Blood Culture (routine x 2)     Status: None (Preliminary result)   Collection Time: 08/18/24  9:30 AM   Specimen: BLOOD  Result Value Ref Range Status   Specimen Description BLOOD BLOOD RIGHT ARM  Final   Special Requests   Final    Blood Culture adequate volume BOTTLES DRAWN AEROBIC AND ANAEROBIC   Culture   Final    NO  GROWTH 2 DAYS Performed at [facility], [address], [place], [place] [address]    Report Status PENDING  Incomplete  Blood Culture (routine x 2)     Status: Abnormal (Preliminary result)   Collection Time: 08/18/24  9:35 AM   Specimen: BLOOD  Result Value Ref Range Status   Specimen Description   Final    BLOOD RIGHT ANTECUBITAL Performed at [facility], [address] [place], [place] [address]    Special Requests   Final    BOTTLES DRAWN AEROBIC AND ANAEROBIC Blood Culture results may not be optimal due to an inadequate volume of blood received in culture bottles Performed at [facility], [address], [place], [place] [address]    Culture  Setup Time   Final    ANAEROBIC BOTTLE ONLY GRAM POSITIVE COCCI Gram Stain Report Called to,Read Back By and Verified With: D. MISS RN 08/19/24 @0726  BY J. WHITE GRAM STAIN REVIEWED-AGREE WITH RESULT DRT CRITICAL RESULT CALLED TO, READ BACK BY AND VERIFIED WITH: PHARMD STEVEN HURTH ON 08/19/24 @ 1413 BY DRT    Culture (A)  Final    STAPHYLOCOCCUS AUREUS SUSCEPTIBILITIES TO FOLLOW Performed at [facility], [address] [place], [place] [address]    Report Status PENDING  Incomplete  Blood Culture ID Panel (Reflexed)     Status: Abnormal   Collection Time: 08/18/24  9:35 AM  Result Value Ref Range Status   Enterococcus faecalis NOT DETECTED NOT DETECTED Final   Enterococcus Faecium NOT DETECTED NOT DETECTED Final   Listeria monocytogenes NOT DETECTED NOT DETECTED Final  Staphylococcus species DETECTED (A) NOT DETECTED Final    Comment: CRITICAL RESULT CALLED TO, READ BACK BY AND VERIFIED WITH: PHARMD STEVEN HURTH ON 08/19/24 @ 1413 BY DRT    Staphylococcus aureus (BCID) DETECTED (A) NOT DETECTED Final    Comment: Methicillin (oxacillin)-resistant Staphylococcus aureus (MRSA). MRSA is predictably resistant to beta-lactam antibiotics (except ceftaroline). Preferred therapy is vancomycin  unless clinically contraindicated.  Patient requires contact precautions if  hospitalized. CRITICAL RESULT CALLED TO, READ BACK BY AND VERIFIED WITH: PHARMD STEVEN HURTH ON 08/19/24 @ 1413 BY DRT    Staphylococcus epidermidis NOT DETECTED NOT DETECTED Final   Staphylococcus lugdunensis NOT DETECTED NOT DETECTED Final   Streptococcus species NOT DETECTED NOT DETECTED Final   Streptococcus agalactiae NOT DETECTED NOT DETECTED Final   Streptococcus pneumoniae NOT DETECTED NOT DETECTED Final   Streptococcus pyogenes NOT DETECTED NOT DETECTED Final   A.calcoaceticus-baumannii NOT DETECTED NOT DETECTED Final   Bacteroides fragilis NOT DETECTED NOT DETECTED Final   Enterobacterales NOT DETECTED NOT DETECTED Final   Enterobacter cloacae complex NOT DETECTED NOT DETECTED Final   Escherichia coli NOT DETECTED NOT DETECTED Final   Klebsiella aerogenes NOT DETECTED NOT DETECTED Final   Klebsiella oxytoca NOT DETECTED NOT DETECTED Final   Klebsiella pneumoniae NOT DETECTED NOT DETECTED Final   Proteus species NOT DETECTED NOT DETECTED Final   Salmonella species NOT DETECTED NOT DETECTED Final   Serratia marcescens NOT DETECTED NOT DETECTED Final   Haemophilus influenzae NOT DETECTED NOT DETECTED Final   Neisseria meningitidis NOT DETECTED NOT DETECTED Final   Pseudomonas aeruginosa NOT DETECTED NOT DETECTED Final   Stenotrophomonas maltophilia NOT DETECTED NOT DETECTED Final   Candida albicans NOT DETECTED NOT DETECTED Final   Candida auris NOT DETECTED NOT DETECTED Final   Candida glabrata NOT DETECTED NOT DETECTED Final   Candida krusei NOT DETECTED NOT DETECTED Final   Candida parapsilosis NOT DETECTED NOT DETECTED Final   Candida tropicalis NOT DETECTED NOT DETECTED Final   Cryptococcus neoformans/gattii NOT DETECTED NOT DETECTED Final   Meth resistant mecA/C and MREJ DETECTED (A) NOT DETECTED Final    Comment: CRITICAL RESULT CALLED TO, READ BACK BY AND VERIFIED WITH: PHARMD STEVEN HURTH ON 08/19/24 @ 1413 BY DRT Performed at  [facility], [address], [place], [place] [address]   Culture, blood (Routine X 2) w Reflex to ID Panel     Status: None (Preliminary result)   Collection Time: 08/19/24 11:43 AM   Specimen: BLOOD  Result Value Ref Range Status   Specimen Description BLOOD BLOOD RIGHT ARM  Final   Special Requests   Final    BOTTLES DRAWN AEROBIC AND ANAEROBIC Blood Culture results may not be optimal due to an inadequate volume of blood received in culture bottles   Culture   Final    NO GROWTH < 24 HOURS Performed at [facility], [address], [place], [place] [address]    Report Status PENDING  Incomplete  Culture, blood (Routine X 2) w Reflex to ID Panel     Status: None (Preliminary result)   Collection Time: 08/19/24 11:43 AM   Specimen: BLOOD  Result Value Ref Range Status   Specimen Description BLOOD BLOOD RIGHT HAND  Final   Special Requests   Final    Blood Culture results may not be optimal due to an inadequate volume of blood received in culture bottles BOTTLES DRAWN AEROBIC AND ANAEROBIC   Culture   Final    NO GROWTH < 24 HOURS  Performed at [facility], [address], [place], [place] [address]    Report Status PENDING  Incomplete    Radiology Reports ECHOCARDIOGRAM COMPLETE Result Date: 08/19/2024    ECHOCARDIOGRAM REPORT   Patient Name:   ESIQUIO BOESEN Date of Exam: 08/19/2024 Medical Rec #:  [id number]        Height:       69.0 in Accession #:    [id number]       Weight:       229.3 lb Date of Birth:  [birth year]       BSA:          2.190 m Patient Age:    72 years         BP:           141/71 mmHg Patient Gender: M                HR:           73 bpm. Exam Location:  [facility] Procedure: 2D Echo, Cardiac Doppler, Color Doppler and Intracardiac            Opacification Agent (Both Spectral and Color Flow Doppler were            utilized during procedure). Indications:    Bacteremia  History:        Patient has prior history of Echocardiogram examinations.                  Signs/Symptoms:Bacteremia.  Sonographer:    Vella Key Referring Phys: [phone] Sherma Vanmetre A Eryca Bolte  Sonographer Comments: Technically difficult study due to poor echo windows. IMPRESSIONS  1. Poor Echo windows for evaluation of infective endocarditis. Valves not well visualized. Consider TEE if clinically indicated.  2. No LV thrombus by Definity . Left ventricular ejection fraction, by estimation, is 70 to 75%. The left ventricle has hyperdynamic function. The left ventricle has no regional wall motion abnormalities. Left ventricular diastolic parameters are consistent with Grade I diastolic dysfunction (impaired relaxation).  3. Right ventricular systolic function was not well visualized. The right ventricular size is normal. Tricuspid regurgitation signal is inadequate for assessing PA pressure.  4. The mitral valve was not well visualized. No evidence of mitral valve regurgitation. No evidence of mitral stenosis.  5. The aortic valve was not well visualized. Aortic valve regurgitation is not visualized. No aortic stenosis is present. FINDINGS  Left Ventricle: No LV thrombus by Definity . Left ventricular ejection fraction, by estimation, is 70 to 75%. The left ventricle has hyperdynamic function. The left ventricle has no regional wall motion abnormalities. Definity  contrast agent was given IV  to delineate the left ventricular endocardial borders. Strain was performed and the global longitudinal strain is indeterminate. The left ventricular internal cavity size was normal in size. There is no left ventricular hypertrophy. Left ventricular diastolic parameters are consistent with Grade I diastolic dysfunction (impaired relaxation). Normal left ventricular filling pressure. Right Ventricle: The right ventricular size is normal. No increase in right ventricular wall thickness. Right ventricular systolic function was not well visualized. Tricuspid regurgitation signal is inadequate for assessing PA pressure. Left Atrium:  Left atrial size was normal in size. Right Atrium: Right atrial size was normal in size. Pericardium: There is no evidence of pericardial effusion. Mitral Valve: The mitral valve was not well visualized. No evidence of mitral valve regurgitation. No evidence of mitral valve stenosis. Tricuspid Valve: The tricuspid valve is not well visualized. Tricuspid valve regurgitation is not demonstrated. No  evidence of tricuspid stenosis. Aortic Valve: The aortic valve was not well visualized. Aortic valve regurgitation is not visualized. No aortic stenosis is present. Pulmonic Valve: The pulmonic valve was not well visualized. Pulmonic valve regurgitation is not visualized. No evidence of pulmonic stenosis. Aorta: The aortic root is normal in size and structure. Venous: The inferior vena cava was not well visualized. IAS/Shunts: No atrial level shunt detected by color flow Doppler. Additional Comments: 3D was performed not requiring image post processing on an independent workstation and was indeterminate.   LV Volumes (MOD) LV vol d, MOD A2C: 48.6 ml Diastology LV vol d, MOD A4C: 36.0 ml LV e' medial:    8.27 cm/s LV vol s, MOD A2C: 8.1 ml  LV E/e' medial:  8.1 LV vol s, MOD A4C: 6.0 ml  LV e' lateral:   6.09 cm/s LV SV MOD A2C:     40.5 ml LV E/e' lateral: 11.0 LV SV MOD A4C:     36.0 ml LV SV MOD BP:      37.0 ml RIGHT VENTRICLE RV Basal diam:  3.20 cm RV S prime:     11.60 cm/s TAPSE (M-mode): 3.6 cm LEFT ATRIUM           Index        RIGHT ATRIUM           Index LA Vol (A2C): 53.0 ml 24.21 ml/m  RA Area:     18.30 cm LA Vol (A4C): 62.5 ml 28.55 ml/m  RA Volume:   53.80 ml  24.57 ml/m  AORTIC VALVE LVOT Vmax:   75.00 cm/s LVOT Vmean:  55.100 cm/s LVOT VTI:    0.176 m MITRAL VALVE MV Area (PHT): 4.83 cm    SHUNTS MV Decel Time: 157 msec    Systemic VTI: 0.18 m MV E velocity: 66.90 cm/s MV A velocity: 76.80 cm/s MV E/A ratio:  0.87 Vishnu Priya Mallipeddi Electronically signed by Diannah Late Mallipeddi Signature  Date/Time: 08/19/2024/4:45:53 PM    Final    US  ARTERIAL ABI (SCREENING LOWER EXTREMITY) Result Date: 08/19/2024 CLINICAL DATA:  Edema. EXAM: NONINVASIVE PHYSIOLOGIC VASCULAR STUDY OF BILATERAL LOWER EXTREMITIES TECHNIQUE: Evaluation of both lower extremities were performed at rest, including calculation of ankle-brachial indices with single level pressure measurements and doppler recording. COMPARISON:  None available. FINDINGS: Right ABI:  1.03 Left ABI:  1.14 Right Lower Extremity:  Normal arterial waveforms at the ankle. Left Lower Extremity:  Normal arterial waveforms at the ankle. 1.0-1.4 Normal IMPRESSION: No evidence of significant lower extremity peripheral arterial disease Electronically Signed   By: Aliene Lloyd M.D.   On: 08/19/2024 12:58    SIGNED: [name], MD, FHM. FAAFP. Jolynn Pack - [facility] Time spent - 55 min.  In seeing, evaluating and examining the patient. Reviewing medical records, labs, drawn plan of care. [facility],  Pager (please use [email or web address] to page/ text) Please use Epic Secure Chat for non-urgent communication (7AM-7PM)  If 7PM-7AM, please contact night-coverage [email or web address], 08/20/2024, 11:46 AM

## 2024-08-20 NOTE — Plan of Care (Signed)
  Problem: Fluid Volume: Goal: Hemodynamic stability will improve Outcome: Progressing   Problem: Clinical Measurements: Goal: Diagnostic test results will improve Outcome: Progressing Goal: Signs and symptoms of infection will decrease Outcome: Progressing   Problem: Respiratory: Goal: Ability to maintain adequate ventilation will improve Outcome: Progressing   Problem: Education: Goal: Knowledge of General Education information will improve Description: Including pain rating scale, medication(s)/side effects and non-pharmacologic comfort measures Outcome: Progressing   Problem: Health Behavior/Discharge Planning: Goal: Ability to manage health-related needs will improve Outcome: Progressing   Problem: Clinical Measurements: Goal: Ability to maintain clinical measurements within normal limits will improve Outcome: Progressing Goal: Will remain free from infection Outcome: Progressing

## 2024-08-20 NOTE — Care Management Important Message (Signed)
 Important Message  Patient Details  Name: [name] MRN: [id number] Date of Birth: 1951-09-30   Important Message Given:  Yes - Medicare IM     [name] 08/20/2024, 11:59 AM

## 2024-08-20 NOTE — Progress Notes (Signed)
 Cardiology asked to assist with scheduling TEE - Dr. Mallipeddi discussed with Dr. Willette. Patient ate this morning 0845, NPO thereafter. Dr. Mallipeddi indicated could do around 2:45-3 to allow for 6 hours NPO, however, anesthesia unable to accommodate on schedule at that time today per my discussion with Elveria in short stay. Tentatively put on for Monday at 1:55pm with Dr. Alvan - asked IM team to keep patient NPO after MN for Monday and sent msg to APP/MD covering hospital on Monday to review for final OK from MD that day as well as orders/consent.

## 2024-08-20 NOTE — Progress Notes (Signed)
 PHARMACY - ANTICOAGULATION CONSULT NOTE  Pharmacy Consult for Warfarin Indication: History of DVT  Allergies  Allergen Reactions   Orencia  [Abatacept ] Anaphylaxis    Had prostate cancer   Carbamazepine Rash    Makes drowsy Brand name is Tegretol    Celecoxib Itching and Rash    Brand name is Celebrex   Cephalexin Rash    Severe Rash. Brand name is Keflex Patient has tolerated ceftriaxone  05/2019   Enbrel [Etanercept] Rash   Humira [Adalimumab] Rash   Levofloxacin  Other (See Comments)    GI Intolerance Brand name is Levaquin    Sulfa Antibiotics Rash   Sulfasalazine Rash    Patient Measurements:  Wt: 99.8 kg (220 lb) Ht: 5' 9 (175.3 cm)  Vital Signs: Temp: 98.7 F (37.1 C) (08/22 0438) Temp Source: Oral (08/22 0438) BP: 171/80 (08/22 0438) Pulse Rate: 72 (08/22 0438)  Labs: Recent Labs    08/18/24 0930 08/19/24 0415 08/20/24 0505  HGB 14.3 13.9 13.3  HCT 43.4 43.1 39.6  PLT 142* 129* 125*  APTT  --  44*  --   LABPROT 34.6* 36.4* 28.2*  INR 3.2* 3.5* 2.5*  CREATININE 1.59* 1.39* 1.35*  CKTOTAL  --   --  227    Estimated Creatinine Clearance: 57.2 mL/min (A) (by C-G formula based on SCr of 1.35 mg/dL (H)).   Medical History: Past Medical History:  Diagnosis Date   Arthritis    RA   Asthma    CHF (congestive heart failure) (HCC)    pt denies    Clotting disorder (HCC)    DVT both legs    COPD (chronic obstructive pulmonary disease) (HCC)    DDD (degenerative disc disease), cervical    with UE's paresthesias   Diverticulosis    DVT, lower extremity (HCC)    bilat   Eye abnormality    right eye drifts has difficulty focusing with right eye has had since birth    GERD (gastroesophageal reflux disease)    Gout    History of measles    History of shingles    Hypercholesterolemia    IBS (irritable bowel syndrome)    IBS (irritable bowel syndrome)    Peripheral edema    Pneumonia    hx of    Prostate cancer (HCC)    Rheumatoid arthritis  (HCC)    Seizures (HCC)    last seizure 20 years ago; on dilantin . Unknown origin.   Shortness of breath dyspnea    exertion    Sleep apnea    cpap    Tubular adenoma of colon 04/2008    Medications:   PTA pt taking warfarin (3 mg) at bedtime.   Assessment:  Pt is a 79 YOM  with PMH of HF, COPD, DVT. He presented to ED with c/o fever and right leg pain and swelling. Pt has chronic leg issues due to history of DVT and had a DVT ultrasound 2 weeks ago that was negative. Last week he completed course of abx for cellulitis of the right leg. Yesterday pt developed new acute pain in his right leg along with swelling and redness. Pt is currently taking warfarin. Pharmacy was consulted for warfarin dosing due to history of DVT.   INR 3.2 > 3.5>2.5  Goal of Therapy:  INR 2-3 Monitor platelets by anticoagulation protocol: Yes   Plan:  Warfarin 3mg  tonight Continue to monitor INR daily   Dempsey Blush PharmD., BCPS Clinical Pharmacist 08/20/2024 11:19 AM

## 2024-08-21 DIAGNOSIS — B9562 Methicillin resistant Staphylococcus aureus infection as the cause of diseases classified elsewhere: Secondary | ICD-10-CM | POA: Diagnosis not present

## 2024-08-21 LAB — CBC
HCT: 41.7 % (ref 39.0–52.0)
Hemoglobin: 13.8 g/dL (ref 13.0–17.0)
MCH: 29.6 pg (ref 26.0–34.0)
MCHC: 33.1 g/dL (ref 30.0–36.0)
MCV: 89.3 fL (ref 80.0–100.0)
Platelets: 153 K/uL (ref 150–400)
RBC: 4.67 MIL/uL (ref 4.22–5.81)
RDW: 15.4 % (ref 11.5–15.5)
WBC: 6.6 K/uL (ref 4.0–10.5)
nRBC: 0 % (ref 0.0–0.2)

## 2024-08-21 LAB — CULTURE, BLOOD (ROUTINE X 2)

## 2024-08-21 LAB — BASIC METABOLIC PANEL WITH GFR
Anion gap: 8 (ref 5–15)
BUN: 15 mg/dL (ref 8–23)
CO2: 26 mmol/L (ref 22–32)
Calcium: 8.5 mg/dL — ABNORMAL LOW (ref 8.9–10.3)
Chloride: 108 mmol/L (ref 98–111)
Creatinine, Ser: 1.26 mg/dL — ABNORMAL HIGH (ref 0.61–1.24)
GFR, Estimated: >60 mL/min (ref >60)
Glucose, Bld: 105 mg/dL — ABNORMAL HIGH (ref 70–99)
Potassium: 3.2 mmol/L — ABNORMAL LOW (ref 3.5–5.1)
Sodium: 142 mmol/L (ref 135–145)

## 2024-08-21 LAB — GLUCOSE, CAPILLARY: Glucose-Capillary: 110 mg/dL — ABNORMAL HIGH (ref 70–99)

## 2024-08-21 LAB — PROTIME-INR
INR: 1.8 — ABNORMAL HIGH (ref 0.8–1.2)
Prothrombin Time: 21.5 s — ABNORMAL HIGH (ref 11.4–15.2)

## 2024-08-21 MED ORDER — POTASSIUM CHLORIDE CRYS ER 20 MEQ PO TBCR
40.0000 meq | EXTENDED_RELEASE_TABLET | Freq: Once | ORAL | Status: AC
  Administered 2024-08-21: 40 meq via ORAL
  Filled 2024-08-21: qty 2

## 2024-08-21 MED ORDER — WARFARIN SODIUM 5 MG PO TABS
5.0000 mg | ORAL_TABLET | Freq: Once | ORAL | Status: AC
  Administered 2024-08-21: 5 mg via ORAL
  Filled 2024-08-21: qty 1

## 2024-08-21 NOTE — Plan of Care (Signed)
  Problem: Fluid Volume: Goal: Hemodynamic stability will improve Outcome: Progressing   Problem: Respiratory: Goal: Ability to maintain adequate ventilation will improve Outcome: Progressing   Problem: Clinical Measurements: Goal: Signs and symptoms of infection will decrease Outcome: Progressing   Problem: Clinical Measurements: Goal: Diagnostic test results will improve Outcome: Progressing   Problem: Education: Goal: Knowledge of General Education information will improve Description: Including pain rating scale, medication(s)/side effects and non-pharmacologic comfort measures Outcome: Progressing

## 2024-08-21 NOTE — Progress Notes (Signed)
 PHARMACY - ANTICOAGULATION CONSULT NOTE  Pharmacy Consult for Warfarin Indication: History of DVT  Allergies  Allergen Reactions   Orencia  [Abatacept ] Anaphylaxis    Had prostate cancer   Carbamazepine Rash    Makes drowsy Brand name is Tegretol    Celecoxib Itching and Rash    Brand name is Celebrex   Cephalexin Rash    Severe Rash. Brand name is Keflex Patient has tolerated ceftriaxone  05/2019   Enbrel [Etanercept] Rash   Humira [Adalimumab] Rash   Levofloxacin  Other (See Comments)    GI Intolerance Brand name is Levaquin    Sulfa Antibiotics Rash   Sulfasalazine Rash    Patient Measurements:  Wt: 99.8 kg (220 lb) Ht: 5' 9 (175.3 cm)  Vital Signs: Temp: 97.8 F (36.6 C) (08/23 0601) BP: 157/80 (08/23 0601) Pulse Rate: 71 (08/23 0601)  Labs: Recent Labs    08/19/24 0415 08/20/24 0505 08/21/24 0435  HGB 13.9 13.3 13.8  HCT 43.1 39.6 41.7  PLT 129* 125* 153  APTT 44*  --   --   LABPROT 36.4* 28.2* 21.5*  INR 3.5* 2.5* 1.8*  CREATININE 1.39* 1.35* 1.26*  CKTOTAL  --  227  --     Estimated Creatinine Clearance: 61 mL/min (A) (by C-G formula based on SCr of 1.26 mg/dL (H)).   Medical History: Past Medical History:  Diagnosis Date   Arthritis    RA   Asthma    CHF (congestive heart failure) (HCC)    pt denies    Clotting disorder (HCC)    DVT both legs    COPD (chronic obstructive pulmonary disease) (HCC)    DDD (degenerative disc disease), cervical    with UE's paresthesias   Diverticulosis    DVT, lower extremity (HCC)    bilat   Eye abnormality    right eye drifts has difficulty focusing with right eye has had since birth    GERD (gastroesophageal reflux disease)    Gout    History of measles    History of shingles    Hypercholesterolemia    IBS (irritable bowel syndrome)    IBS (irritable bowel syndrome)    Peripheral edema    Pneumonia    hx of    Prostate cancer (HCC)    Rheumatoid arthritis (HCC)    Seizures (HCC)    last  seizure 20 years ago; on dilantin . Unknown origin.   Shortness of breath dyspnea    exertion    Sleep apnea    cpap    Tubular adenoma of colon 04/2008    Medications:   PTA pt taking warfarin (3 mg) at bedtime.   Assessment:  Pt is a 89 YOM  with PMH of HF, COPD, DVT. He presented to ED with c/o fever and right leg pain and swelling. Pt has chronic leg issues due to history of DVT and had a DVT ultrasound 2 weeks ago that was negative. Last week he completed course of abx for cellulitis of the right leg. Yesterday pt developed new acute pain in his right leg along with swelling and redness. Pt is currently taking warfarin. Pharmacy was consulted for warfarin dosing due to history of DVT.   INR 3.2 > 3.5>2.5>1.8 today. No bleeding noted. CBC stable. Will give extra warfarin tonight.  Goal of Therapy:  INR 2-3 Monitor platelets by anticoagulation protocol: Yes   Plan:  Warfarin 5mg  tonight Continue to monitor INR daily   Dempsey Blush PharmD., BCPS Clinical Pharmacist 08/21/2024 8:08 AM

## 2024-08-21 NOTE — Progress Notes (Signed)
 PROGRESS NOTE    Patient: [name]                            PCP: Marvine Rush, MD                    DOB: [birth year]            DOA: 08/18/2024 FMW:[id number]             DOS: 08/21/2024, 11:12 AM   LOS: 3 days   Date of Service: The patient was seen and examined on 08/21/2024  Subjective:   The patient was seen and examined this morning, stable no acute distress. Hemodynamically stable  Discussed his plan regarding IV antibiotics and TEE this Monday-expressed understanding    Brief Narrative:   CAROLL CUNNINGTON is a 73 year old male with extensive history of rheumatoid arthritis osteoarthritis, chronic DVT on Coumadin , diastolic CHF, COPD, gout, GERD..  Presented to ED with chief complaint of right leg pain and fever.  Reported has been going on for past 2 weeks.  Chronic right lower extremity edema.  Ultrasound 2 weeks ago was negative for DVT.  Otherwise is progressively becoming more erythematous with edema. Tmax at home 100.6.  No other complaints.   ED evaluation: Blood pressure (!) 153/83, pulse 78, temperature 97.9 F (36.6 C), temperature source Oral, resp. rate 17, SpO2 96%.  Abnormal labs: WBC 18, platelets 142, creatinine 1.59, INR 3.2, Cr 2.3>> 1.6  Lower extremity Doppler study: Right soft tissue swelling, cellulitis, left: Partially occlusive left popliteal vein thrombus with lack of complete compressibilit   Patient was given broad-spectrum antibiotics Rocephin , IV fluids requested to be admitted for cellulitis    Assessment & Plan:   Principal Problem:   Sepsis (HCC) Active Problems:   Chronic anticoagulation   Personal history of DVT (deep vein thrombosis)   Weakness   GERD   Rheumatoid arthritis (HCC)   Malignant neoplasm of prostate (HCC)   DVT (deep venous thrombosis) (HCC)   COPD with acute exacerbation (HCC)   History of COPD   History of seizure disorder   Primary osteoarthritis of both feet   Dyspnea on exertion   OSA  (obstructive sleep apnea)   Class 2 severe obesity due to excess calories with serious comorbidity and body mass index (BMI) of 35.0 to 35.9 in adult (HCC)   Chronic diastolic HF (heart failure) (HCC)   Stage 3a chronic kidney disease (CKD) (HCC)   MRSA bacteremia   MRSA infection     Assessment and Plan:  * Sepsis (HCC)/bacteremia MRSA bacteremia Source of infection right lower extremity wound/cellulitis  -Improved sepsis physiology, hemodynamically stable  - Blood culture from 8/20  positive for Staph aureus - 8/21 Echocardiogram-poor visualization otherwise negative no signs of vegetation or endocarditis - 08/19/2024 consult infectious disease team-IV ABX Zyvox  switched to daptomycin  - Repeating blood cultures--  - Consulting cardiology for transesophageal echocardiogram Likely will be completed Monday 08/23/2024  POA: Right lower extremity cellulitis -with open wound, negative for DVT POA: Patient meets sepsis due to cellulitis-  w leukocytosis WBC 18, lactic acid 2.3, creatinine 1.59, RR 21 -Immunocompromised due to rheumatoid arthritis-medication -S/p IV fluid resuscitation, IV antibiotics   Hypokalemia -monitoring repleted   chronic anticoagulation - Chronically anticoagulated for chronic DVT on Coumadin   - INR therapeutic at 3.2 >> 3.5, 1.8 -Will dose per pharmacy  Weakness - Consulting PT OT for evaluation recommendations  Personal  history of DVT (deep vein thrombosis) Continue Coumadin  Left lower extremity Doppler positive for persistent chronic DVT  Acute on chronic stage 3a chronic kidney disease (CKD) (HCC) - Continue to improve - Monitoring closely, avoiding nephrotoxins, avoiding hypotension -Mildly elevated BUN/creatinine from baseline  Lab Results  Component Value Date   CREATININE 1.26 (H) 08/21/2024   CREATININE 1.35 (H) 08/20/2024   CREATININE 1.39 (H) 08/19/2024    estimated creatinine clearance is 48.2 mL/min (A) (by C-G formula based on  SCr of 1.59 mg/dL (H)).   Chronic diastolic HF (heart failure) (HCC) -Hemodynamically stable - Will monitor I's and O's, daily weights -Unfortunately patient needs fluid resuscitation due to meeting sepsis criteria -Will monitor closely for volume overload, resuming his home medication of 80 mg Lasix  in a.m. -Last echo March 2023 EGF 55-60% normal LV function normal RV function valves within normal limits, no LVH - Resuming oral Lasix   Class 2 severe obesity due to excess calories with serious comorbidity and body mass index (BMI) of 35.0 to 35.9 in adult (HCC) Height 5'9, weight 99.8 kg, BMI 32.49 kg/m -Discussed with patient, advised follow-up with PCP regarding outpatient weight loss programs  OSA (obstructive sleep apnea) Monitor closely, nightly CPAP  Dyspnea on exertion -continue inhalers as needed nebs   Primary osteoarthritis of both feet - Continue current meds, With holding immunosuppressants: Including Tocilizumab    History of seizure disorder -stable continue phenytoin    History of COPD-stable no signs of exacerbation -as needed DuoNeb    DVT (deep venous thrombosis) (HCC) - Chronically on Coumadin , bilateral lower extremity Doppler studies still revealing left lower extremity DVT, right cellulitic changes -INR therapeutic at 3.2  Malignant neoplasm of prostate [facility]) - Follow-up with urologist as outpatient  Rheumatoid arthritis (HCC) Holding Tocilizumab    GERD Continue PPI     ---------------------------------------------------------------------------------------------------------------------------------------------- Nutritional status:  The patient's BMI is: Body mass index is 32.72 kg/m. I agree with the assessment and plan as outlined   ------------------------------------------------------------------------------------------------------------------- Cultures; 08/18/24  Blood Cultures x 2 >> MRSA 08/19/24 repeat blood cultures   8/20 - 8/21 -  Zyvox  8/21 - IV Cubicin  ---------------------------------------------------------------------------------------------------------------------------------------------  DVT prophylaxis:  SCDs Start: 08/18/24 1210 warfarin (COUMADIN ) tablet 5 mg   Code Status:   Code Status: Full Code  Family Communication: No family member present at bedside-  -Advance care planning has been discussed.   Admission status:   Status is: Inpatient Remains inpatient appropriate because: Needing IV antibiotics,   Disposition: From  - home             Planning for discharge in 1-2 days   Procedures:   No admission procedures for hospital encounter.   Antimicrobials:  Anti-infectives (From admission, onward)    Start     Dose/Rate Route Frequency Ordered Stop   08/20/24 1600  vancomycin  (VANCOREADY) IVPB 1250 mg/250 mL  Status:  Discontinued        1,250 mg 166.7 mL/hr over 90 Minutes Intravenous Every 24 hours 08/19/24 1442 08/19/24 1554   08/19/24 1645  DAPTOmycin  (CUBICIN ) IVPB 700 mg/100mL premix        8 mg/kg  84 kg (Adjusted) 200 mL/hr over 30 Minutes Intravenous Daily 08/19/24 1554     08/19/24 1530  vancomycin  (VANCOREADY) IVPB 2000 mg/400 mL  Status:  Discontinued        2,000 mg 200 mL/hr over 120 Minutes Intravenous  Once 08/19/24 1441 08/19/24 1554   08/19/24 0000  clindamycin  (CLEOCIN ) 300 MG capsule  300 mg Oral 3 times daily 08/19/24 1048 08/25/24 2359   08/18/24 1400  linezolid  (ZYVOX ) IVPB 600 mg  Status:  Discontinued        600 mg 300 mL/hr over 60 Minutes Intravenous Every 12 hours 08/18/24 1215 08/19/24 1441   08/18/24 1015  cefTRIAXone  (ROCEPHIN ) 2 g in sodium chloride  0.9 % 100 mL IVPB        2 g 200 mL/hr over 30 Minutes Intravenous Once 08/18/24 1012 08/18/24 1110        Medication:   allopurinol   100 mg Oral BID   amLODipine   10 mg Oral Daily   budesonide -glycopyrrolate -formoterol   2 puff Inhalation BID   diclofenac  Sodium  2 g Topical QID   furosemide    80 mg Oral Daily   leflunomide   20 mg Oral Daily   mupirocin  ointment   Topical TID   oxybutynin   10 mg Oral Daily   pantoprazole   40 mg Oral Daily   phenytoin   100 mg Oral Daily   And   phenytoin   200 mg Oral QHS   rosuvastatin   10 mg Oral Daily   sodium chloride  flush  3 mL Intravenous Q12H   sodium chloride  flush  3 mL Intravenous Q12H   spironolactone   50 mg Oral Daily   warfarin  5 mg Oral ONCE-1600   Warfarin - Pharmacist Dosing Inpatient   Does not apply q1600    acetaminophen  **OR** acetaminophen , bisacodyl , hydrALAZINE , HYDROmorphone  (DILAUDID ) injection, ipratropium, levalbuterol , ondansetron  **OR** ondansetron  (ZOFRAN ) IV, oxyCODONE , senna-docusate, sodium phosphate , traZODone    Objective:   Vitals:   08/20/24 2155 08/21/24 0601 08/21/24 0727 08/21/24 1028  BP: (!) 152/78 (!) 157/80  (!) 156/78  Pulse:  71  73  Resp:  20    Temp:  97.8 F (36.6 C)    TempSrc:      SpO2:  93% 95%   Weight:  100.5 kg      Intake/Output Summary (Last 24 hours) at 08/21/2024 1112 Last data filed at 08/21/2024 0948 Gross per 24 hour  Intake 1090 ml  Output --  Net 1090 ml   Filed Weights   08/19/24 0429 08/20/24 0438 08/21/24 0601  Weight: 104 kg 101.5 kg 100.5 kg     Physical examination:     General:  AAO x 3,  cooperative, no distress;   HEENT:  Normocephalic, PERRL, otherwise with in Normal limits   Neuro:  CNII-XII intact. , normal motor and sensation, reflexes intact   Lungs:   Clear to auscultation BL, Respirations unlabored,  No wheezes / crackles  Cardio:    S1/S2, RRR, No murmure, No Rubs or Gallops   Abdomen:  Soft, non-tender, bowel sounds active all four quadrants, no guarding or peritoneal signs.  Muscular  skeletal:  Limited exam -global generalized weaknesses - in bed, able to move all 4 extremities,   2+ pulses,  symmetric, RLE +2  pitting edema  Skin:  Dry, warm to touch, negative for any Rashes,   Wounds: Right lateral lower extremity wound  -please see nurses documentation       ------------------------------------------------------------------------------------------------------------------------------------------    LABs:     Latest Ref Rng & Units 08/21/2024    4:35 AM 08/20/2024    5:05 AM 08/19/2024    4:15 AM  CBC  WBC 4.0 - 10.5 K/uL 6.6  7.2  11.5   Hemoglobin 13.0 - 17.0 g/dL 86.1  86.6  86.0   Hematocrit 39.0 - 52.0 % 41.7  39.6  43.1  Platelets 150 - 400 K/uL 153  125  129       Latest Ref Rng & Units 08/21/2024    4:35 AM 08/20/2024    5:05 AM 08/19/2024    4:15 AM  CMP  Glucose 70 - 99 mg/dL 894  892  899   BUN 8 - 23 mg/dL 15  14  14    Creatinine 0.61 - 1.24 mg/dL 8.73  8.64  8.60   Sodium 135 - 145 mmol/L 142  141  140   Potassium 3.5 - 5.1 mmol/L 3.2  3.3  3.6   Chloride 98 - 111 mmol/L 108  108  105   CO2 22 - 32 mmol/L 26  25  26    Calcium  8.9 - 10.3 mg/dL 8.5  8.5  8.6        Micro Results Recent Results (from the past 240 hours)  Blood Culture (routine x 2)     Status: None (Preliminary result)   Collection Time: 08/18/24  9:30 AM   Specimen: BLOOD  Result Value Ref Range Status   Specimen Description BLOOD BLOOD RIGHT ARM  Final   Special Requests   Final    Blood Culture adequate volume BOTTLES DRAWN AEROBIC AND ANAEROBIC   Culture   Final    NO GROWTH 2 DAYS Performed at [facility], [address], [place], [place] [address]    Report Status PENDING  Incomplete  Blood Culture (routine x 2)     Status: Abnormal   Collection Time: 08/18/24  9:35 AM   Specimen: BLOOD  Result Value Ref Range Status   Specimen Description   Final    BLOOD RIGHT ANTECUBITAL Performed at [facility], [address] [place], [place] [address]    Special Requests   Final    BOTTLES DRAWN AEROBIC AND ANAEROBIC Blood Culture results may not be optimal due to an inadequate volume of blood received in culture bottles Performed at [facility], [address] [place], [place] [address]    Culture   Setup Time   Final    ANAEROBIC BOTTLE ONLY GRAM POSITIVE COCCI Gram Stain Report Called to,Read Back By and Verified With: D. MISS RN 08/19/24 @0726  BY J. WHITE GRAM STAIN REVIEWED-AGREE WITH RESULT DRT CRITICAL RESULT CALLED TO, READ BACK BY AND VERIFIED WITH: PHARMD STEVEN HURTH ON 08/19/24 @ 1413 BY DRT Performed at [facility], [address], [place], [place] [address]    Culture METHICILLIN RESISTANT STAPHYLOCOCCUS AUREUS (A)  Final   Report Status 08/21/2024 FINAL  Final   Organism ID, Bacteria METHICILLIN RESISTANT STAPHYLOCOCCUS AUREUS  Final      Susceptibility   Methicillin resistant staphylococcus aureus - MIC*    CIPROFLOXACIN  >=8 RESISTANT Resistant     ERYTHROMYCIN >=8 RESISTANT Resistant     GENTAMICIN  <=0.5 SENSITIVE Sensitive     OXACILLIN >=4 RESISTANT Resistant     TETRACYCLINE <=1 SENSITIVE Sensitive     VANCOMYCIN  <=0.5 SENSITIVE Sensitive     TRIMETH/SULFA <=10 SENSITIVE Sensitive     CLINDAMYCIN  >=8 RESISTANT Resistant     RIFAMPIN <=0.5 SENSITIVE Sensitive     Inducible Clindamycin  NEGATIVE Sensitive     LINEZOLID  2 SENSITIVE Sensitive     * METHICILLIN RESISTANT STAPHYLOCOCCUS AUREUS  Blood Culture ID Panel (Reflexed)     Status: Abnormal   Collection Time: 08/18/24  9:35 AM  Result Value Ref Range Status   Enterococcus faecalis NOT DETECTED NOT DETECTED Final   Enterococcus Faecium NOT  DETECTED NOT DETECTED Final   Listeria monocytogenes NOT DETECTED NOT DETECTED Final   Staphylococcus species DETECTED (A) NOT DETECTED Final    Comment: CRITICAL RESULT CALLED TO, READ BACK BY AND VERIFIED WITH: PHARMD STEVEN HURTH ON 08/19/24 @ 1413 BY DRT    Staphylococcus aureus (BCID) DETECTED (A) NOT DETECTED Final    Comment: Methicillin (oxacillin)-resistant Staphylococcus aureus (MRSA). MRSA is predictably resistant to beta-lactam antibiotics (except ceftaroline). Preferred therapy is vancomycin  unless clinically contraindicated. Patient requires contact  precautions if  hospitalized. CRITICAL RESULT CALLED TO, READ BACK BY AND VERIFIED WITH: PHARMD STEVEN HURTH ON 08/19/24 @ 1413 BY DRT    Staphylococcus epidermidis NOT DETECTED NOT DETECTED Final   Staphylococcus lugdunensis NOT DETECTED NOT DETECTED Final   Streptococcus species NOT DETECTED NOT DETECTED Final   Streptococcus agalactiae NOT DETECTED NOT DETECTED Final   Streptococcus pneumoniae NOT DETECTED NOT DETECTED Final   Streptococcus pyogenes NOT DETECTED NOT DETECTED Final   A.calcoaceticus-baumannii NOT DETECTED NOT DETECTED Final   Bacteroides fragilis NOT DETECTED NOT DETECTED Final   Enterobacterales NOT DETECTED NOT DETECTED Final   Enterobacter cloacae complex NOT DETECTED NOT DETECTED Final   Escherichia coli NOT DETECTED NOT DETECTED Final   Klebsiella aerogenes NOT DETECTED NOT DETECTED Final   Klebsiella oxytoca NOT DETECTED NOT DETECTED Final   Klebsiella pneumoniae NOT DETECTED NOT DETECTED Final   Proteus species NOT DETECTED NOT DETECTED Final   Salmonella species NOT DETECTED NOT DETECTED Final   Serratia marcescens NOT DETECTED NOT DETECTED Final   Haemophilus influenzae NOT DETECTED NOT DETECTED Final   Neisseria meningitidis NOT DETECTED NOT DETECTED Final   Pseudomonas aeruginosa NOT DETECTED NOT DETECTED Final   Stenotrophomonas maltophilia NOT DETECTED NOT DETECTED Final   Candida albicans NOT DETECTED NOT DETECTED Final   Candida auris NOT DETECTED NOT DETECTED Final   Candida glabrata NOT DETECTED NOT DETECTED Final   Candida krusei NOT DETECTED NOT DETECTED Final   Candida parapsilosis NOT DETECTED NOT DETECTED Final   Candida tropicalis NOT DETECTED NOT DETECTED Final   Cryptococcus neoformans/gattii NOT DETECTED NOT DETECTED Final   Meth resistant mecA/C and MREJ DETECTED (A) NOT DETECTED Final    Comment: CRITICAL RESULT CALLED TO, READ BACK BY AND VERIFIED WITH: PHARMD STEVEN HURTH ON 08/19/24 @ 1413 BY DRT Performed at [facility],  [address] [place], [place] [address]   Culture, blood (Routine X 2) w Reflex to ID Panel     Status: None (Preliminary result)   Collection Time: 08/19/24 11:43 AM   Specimen: BLOOD  Result Value Ref Range Status   Specimen Description BLOOD BLOOD RIGHT ARM  Final   Special Requests   Final    BOTTLES DRAWN AEROBIC AND ANAEROBIC Blood Culture results may not be optimal due to an inadequate volume of blood received in culture bottles   Culture   Final    NO GROWTH < 24 HOURS Performed at [facility], [address] [place], [place] [address]    Report Status PENDING  Incomplete  Culture, blood (Routine X 2) w Reflex to ID Panel     Status: None (Preliminary result)   Collection Time: 08/19/24 11:43 AM   Specimen: BLOOD  Result Value Ref Range Status   Specimen Description BLOOD BLOOD RIGHT HAND  Final   Special Requests   Final    Blood Culture results may not be optimal due to an inadequate volume of blood received in culture bottles BOTTLES DRAWN AEROBIC AND  ANAEROBIC   Culture   Final    NO GROWTH < 24 HOURS Performed at [facility], [address], [place], [place] [address]    Report Status PENDING  Incomplete    Radiology Reports No results found.   SIGNED: [name], MD, FHM. FAAFP. Jolynn Pack - [facility] Time spent - 55 min.  In seeing, evaluating and examining the patient. Reviewing medical records, labs, drawn plan of care. [facility],  Pager (please use [email or web address] to page/ text) Please use Epic Secure Chat for non-urgent communication (7AM-7PM)  If 7PM-7AM, please contact night-coverage [email or web address], 08/21/2024, 11:12 AM

## 2024-08-21 NOTE — Plan of Care (Signed)
  Problem: Fluid Volume: Goal: Hemodynamic stability will improve Outcome: Progressing   Problem: Clinical Measurements: Goal: Diagnostic test results will improve Outcome: Progressing Goal: Signs and symptoms of infection will decrease Outcome: Progressing   Problem: Respiratory: Goal: Ability to maintain adequate ventilation will improve Outcome: Progressing   Problem: Education: Goal: Knowledge of General Education information will improve Description: Including pain rating scale, medication(s)/side effects and non-pharmacologic comfort measures Outcome: Progressing   Problem: Health Behavior/Discharge Planning: Goal: Ability to manage health-related needs will improve Outcome: Progressing   Problem: Clinical Measurements: Goal: Ability to maintain clinical measurements within normal limits will improve Outcome: Progressing Goal: Will remain free from infection Outcome: Progressing Goal: Diagnostic test results will improve Outcome: Progressing Goal: Respiratory complications will improve Outcome: Progressing Goal: Cardiovascular complication will be avoided Outcome: Progressing   Problem: Activity: Goal: Risk for activity intolerance will decrease Outcome: Progressing   Problem: Nutrition: Goal: Adequate nutrition will be maintained Outcome: Progressing   Problem: Coping: Goal: Level of anxiety will decrease Outcome: Progressing   Problem: Elimination: Goal: Will not experience complications related to bowel motility Outcome: Progressing Goal: Will not experience complications related to urinary retention Outcome: Progressing   Problem: Pain Managment: Goal: General experience of comfort will improve and/or be controlled Outcome: Progressing   Problem: Safety: Goal: Ability to remain free from injury will improve Outcome: Progressing   Problem: Skin Integrity: Goal: Risk for impaired skin integrity will decrease Outcome: Progressing   Problem:  Clinical Measurements: Goal: Ability to avoid or minimize complications of infection will improve Outcome: Progressing   Problem: Skin Integrity: Goal: Skin integrity will improve Outcome: Progressing

## 2024-08-22 DIAGNOSIS — R6520 Severe sepsis without septic shock: Secondary | ICD-10-CM | POA: Diagnosis not present

## 2024-08-22 DIAGNOSIS — B9562 Methicillin resistant Staphylococcus aureus infection as the cause of diseases classified elsewhere: Secondary | ICD-10-CM | POA: Diagnosis not present

## 2024-08-22 DIAGNOSIS — G7281 Critical illness myopathy: Secondary | ICD-10-CM

## 2024-08-22 DIAGNOSIS — A403 Sepsis due to Streptococcus pneumoniae: Secondary | ICD-10-CM

## 2024-08-22 LAB — CBC
HCT: 42.7 % (ref 39.0–52.0)
Hemoglobin: 13.9 g/dL (ref 13.0–17.0)
MCH: 29.2 pg (ref 26.0–34.0)
MCHC: 32.6 g/dL (ref 30.0–36.0)
MCV: 89.7 fL (ref 80.0–100.0)
Platelets: 157 K/uL (ref 150–400)
RBC: 4.76 MIL/uL (ref 4.22–5.81)
RDW: 15.3 % (ref 11.5–15.5)
WBC: 7.5 K/uL (ref 4.0–10.5)
nRBC: 0 % (ref 0.0–0.2)

## 2024-08-22 LAB — PROTIME-INR
INR: 1.7 — ABNORMAL HIGH (ref 0.8–1.2)
Prothrombin Time: 20.9 s — ABNORMAL HIGH (ref 11.4–15.2)

## 2024-08-22 LAB — BASIC METABOLIC PANEL WITH GFR
Anion gap: 8 (ref 5–15)
BUN: 17 mg/dL (ref 8–23)
CO2: 25 mmol/L (ref 22–32)
Calcium: 8.5 mg/dL — ABNORMAL LOW (ref 8.9–10.3)
Chloride: 107 mmol/L (ref 98–111)
Creatinine, Ser: 1.31 mg/dL — ABNORMAL HIGH (ref 0.61–1.24)
GFR, Estimated: 57 mL/min — ABNORMAL LOW (ref >60)
Glucose, Bld: 102 mg/dL — ABNORMAL HIGH (ref 70–99)
Potassium: 3.4 mmol/L — ABNORMAL LOW (ref 3.5–5.1)
Sodium: 140 mmol/L (ref 135–145)

## 2024-08-22 MED ORDER — POTASSIUM CHLORIDE CRYS ER 20 MEQ PO TBCR
40.0000 meq | EXTENDED_RELEASE_TABLET | Freq: Once | ORAL | Status: AC
  Administered 2024-08-22: 40 meq via ORAL
  Filled 2024-08-22: qty 2

## 2024-08-22 MED ORDER — FLORANEX PO PACK
1.0000 g | PACK | Freq: Three times a day (TID) | ORAL | Status: DC
  Administered 2024-08-22 (×2): 1 g via ORAL
  Filled 2024-08-22 (×3): qty 1

## 2024-08-22 MED ORDER — WARFARIN SODIUM 5 MG PO TABS
6.0000 mg | ORAL_TABLET | Freq: Once | ORAL | Status: AC
  Administered 2024-08-22: 6 mg via ORAL
  Filled 2024-08-22: qty 1

## 2024-08-22 MED ORDER — FUROSEMIDE 40 MG PO TABS
40.0000 mg | ORAL_TABLET | Freq: Every day | ORAL | Status: DC
  Administered 2024-08-22 – 2024-08-23 (×2): 40 mg via ORAL
  Filled 2024-08-22 (×2): qty 1

## 2024-08-22 MED ORDER — ROSUVASTATIN CALCIUM 10 MG PO TABS: 10.0000 mg | ORAL_TABLET | Freq: Every day | ORAL | Status: DC

## 2024-08-22 NOTE — Progress Notes (Signed)
 PROGRESS NOTE    Patient: [name]                            PCP: Marvine Rush, MD                    DOB: 17-Nov-1951            DOA: 08/18/2024 FMW:[id number]             DOS: 08/22/2024, 11:03 AM   LOS: 4 days   Date of Service: The patient was seen and examined on 08/22/2024  Subjective:  The patient was seen and examined this morning, awake alert oriented, no acute distress No issues overnight Hemodynamically stable   Current plan of TEE for this Monday was explained-expressed understanding    Brief Narrative:   [name] is a 73 year old male with extensive history of rheumatoid arthritis osteoarthritis, chronic DVT on Coumadin , diastolic CHF, COPD, gout, GERD..  Presented to ED with chief complaint of right leg pain and fever.  Reported has been going on for past 2 weeks.  Chronic right lower extremity edema.  Ultrasound 2 weeks ago was negative for DVT.  Otherwise is progressively becoming more erythematous with edema. Tmax at home 100.6.  No other complaints.   ED evaluation: Blood pressure (!) 153/83, pulse 78, temperature 97.9 F (36.6 C), temperature source Oral, resp. rate 17, SpO2 96%.  Abnormal labs: WBC 18, platelets 142, creatinine 1.59, INR 3.2, Cr 2.3>> 1.6  Lower extremity Doppler study: Right soft tissue swelling, cellulitis, left: Partially occlusive left popliteal vein thrombus with lack of complete compressibilit   Patient was given broad-spectrum antibiotics Rocephin , IV fluids requested to be admitted for cellulitis    Assessment & Plan:   Principal Problem:   Sepsis (HCC) Active Problems:   Chronic anticoagulation   Personal history of DVT (deep vein thrombosis)   Weakness   GERD   Rheumatoid arthritis (HCC)   Malignant neoplasm of prostate (HCC)   DVT (deep venous thrombosis) (HCC)   COPD with acute exacerbation (HCC)   History of COPD   History of seizure disorder   Primary osteoarthritis of both feet   Dyspnea on  exertion   OSA (obstructive sleep apnea)   Class 2 severe obesity due to excess calories with serious comorbidity and body mass index (BMI) of 35.0 to 35.9 in adult (HCC)   Chronic diastolic HF (heart failure) (HCC)   Stage 3a chronic kidney disease (CKD) (HCC)   MRSA bacteremia   MRSA infection     Assessment and Plan:  * Sepsis (HCC)/bacteremia MRSA bacteremia Source of infection right lower extremity wound/cellulitis  -Improved sepsis physiology, hemodynamically stable  - Blood culture from 8/20  positive for Staph aureus - 8/21 Echocardiogram-poor visualization otherwise negative no signs of vegetation or endocarditis - 08/19/2024 consult infectious disease team-IV ABX Zyvox  switched to daptomycin  - Repeating blood cultures--no growth to date  -  discussed with ID, and cardiology planning for TEE this Monday 08/23/2024  POA: Right lower extremity cellulitis -with open wound, negative for DVT POA: Patient meets sepsis due to cellulitis-  w leukocytosis WBC 18, lactic acid 2.3, creatinine 1.59, RR 21 -Immunocompromised due to rheumatoid arthritis-medication -S/p IV fluid resuscitation, IV antibiotics   Hypokalemia -monitoring repleted   chronic anticoagulation - Chronically anticoagulated for chronic DVT on Coumadin   - INR therapeutic at 3.2 >> 3.5, 1.8 - Discussed with pharmacy and patient regarding  switching from Coumadin  to possibly Eliquis given affordability  Weakness - Consulting PT OT for evaluation recommendations  Acute on chronic stage 3a chronic kidney disease (CKD) (HCC) - Continue to improve - Monitoring closely, avoiding nephrotoxins, avoiding hypotension -Mildly elevated BUN/creatinine from baseline  Lab Results  Component Value Date   CREATININE 1.31 (H) 08/22/2024   CREATININE 1.26 (H) 08/21/2024   CREATININE 1.35 (H) 08/20/2024    estimated creatinine clearance is 48.2 mL/min (A) (by C-G formula based on SCr of 1.59 mg/dL (H)).   Chronic  diastolic HF (heart failure) (HCC) -Hemodynamically stable - Will monitor I's and O's, daily weights -Unfortunately patient needs fluid resuscitation due to meeting sepsis criteria -Will monitor closely for volume overload, resuming his home medication of 80 mg Lasix  in a.m. -Last echo March 2023 EGF 55-60% normal LV function normal RV function valves within normal limits, no LVH - Resuming oral Lasix  -reducing and holding today's dose -euvolemic, elevated creatinine  Class 2 severe obesity due to excess calories with serious comorbidity and body mass index (BMI) of 35.0 to 35.9 in adult (HCC) Height 5'9, weight 99.8 kg, BMI 32.49 kg/m -Discussed with patient, advised follow-up with PCP regarding outpatient weight loss programs  OSA (obstructive sleep apnea) Monitor closely, nightly CPAP  Dyspnea on exertion -continue inhalers as needed nebs   Primary osteoarthritis of both feet - Continue current meds, With holding immunosuppressants: Including Tocilizumab    History of seizure disorder -stable continue phenytoin    History of COPD-stable no signs of exacerbation -as needed DuoNeb    DVT (deep venous thrombosis) (HCC) - Chronically on Coumadin , bilateral lower extremity Doppler studies still revealing left lower extremity DVT, right cellulitic changes -INR therapeutic at 3.2  Malignant neoplasm of prostate [facility]) - Follow-up with urologist as outpatient  Rheumatoid arthritis (HCC) Holding Tocilizumab    GERD Continue PPI     ---------------------------------------------------------------------------------------------------------------------------------------------- Nutritional status:  The patient's BMI is: Body mass index is 32.65 kg/m. I agree with the assessment and plan as outlined   ------------------------------------------------------------------------------------------------------------------- Cultures; 08/18/24  Blood Cultures x 2 >> MRSA 08/19/24 repeat  blood cultures   8/20 - 8/21 - Zyvox  8/21 - IV Cubicin  ---------------------------------------------------------------------------------------------------------------------------------------------  DVT prophylaxis:  SCDs Start: 08/18/24 1210 warfarin (COUMADIN ) tablet 6 mg   Code Status:   Code Status: Full Code  Family Communication: Wife updated at bedside -Advance care planning has been discussed.   Admission status:   Status is: Inpatient Remains inpatient appropriate because: Needing IV antibiotics,   Disposition: From  - home             Planning for discharge in 1-2 days   Procedures:   No admission procedures for hospital encounter.   Antimicrobials:  Anti-infectives (From admission, onward)    Start     Dose/Rate Route Frequency Ordered Stop   08/20/24 1600  vancomycin  (VANCOREADY) IVPB 1250 mg/250 mL  Status:  Discontinued        1,250 mg 166.7 mL/hr over 90 Minutes Intravenous Every 24 hours 08/19/24 1442 08/19/24 1554   08/19/24 1645  DAPTOmycin  (CUBICIN ) IVPB 700 mg/115mL premix        8 mg/kg  84 kg (Adjusted) 200 mL/hr over 30 Minutes Intravenous Daily 08/19/24 1554     08/19/24 1530  vancomycin  (VANCOREADY) IVPB 2000 mg/400 mL  Status:  Discontinued        2,000 mg 200 mL/hr over 120 Minutes Intravenous  Once 08/19/24 1441 08/19/24 1554   08/19/24 0000  clindamycin  (CLEOCIN ) 300 MG capsule  300 mg Oral 3 times daily 08/19/24 1048 08/25/24 2359   08/18/24 1400  linezolid  (ZYVOX ) IVPB 600 mg  Status:  Discontinued        600 mg 300 mL/hr over 60 Minutes Intravenous Every 12 hours 08/18/24 1215 08/19/24 1441   08/18/24 1015  cefTRIAXone  (ROCEPHIN ) 2 g in sodium chloride  0.9 % 100 mL IVPB        2 g 200 mL/hr over 30 Minutes Intravenous Once 08/18/24 1012 08/18/24 1110        Medication:   allopurinol   100 mg Oral BID   amLODipine   10 mg Oral Daily   budesonide -glycopyrrolate -formoterol   2 puff Inhalation BID   diclofenac  Sodium  2 g  Topical QID   furosemide   40 mg Oral Daily   lactobacillus  1 g Oral TID WC   leflunomide   20 mg Oral Daily   mupirocin  ointment   Topical TID   oxybutynin   10 mg Oral Daily   pantoprazole   40 mg Oral Daily   phenytoin   100 mg Oral Daily   And   phenytoin   200 mg Oral QHS   [START ON 08/24/2024] rosuvastatin   10 mg Oral Daily   sodium chloride  flush  3 mL Intravenous Q12H   sodium chloride  flush  3 mL Intravenous Q12H   spironolactone   50 mg Oral Daily   warfarin  6 mg Oral ONCE-1600   Warfarin - Pharmacist Dosing Inpatient   Does not apply q1600    acetaminophen  **OR** acetaminophen , bisacodyl , hydrALAZINE , HYDROmorphone  (DILAUDID ) injection, ipratropium, levalbuterol , ondansetron  **OR** ondansetron  (ZOFRAN ) IV, oxyCODONE , senna-docusate, sodium phosphate , traZODone    Objective:   Vitals:   08/21/24 2006 08/21/24 2017 08/22/24 0545 08/22/24 0730  BP:  (!) 162/74 136/83   Pulse:  74 61   Resp:  20 16   Temp:  98.5 F (36.9 C) 97.8 F (36.6 C)   TempSrc:      SpO2: 95% 94% 90% 96%  Weight:   100.3 kg     Intake/Output Summary (Last 24 hours) at 08/22/2024 1103 Last data filed at 08/22/2024 0605 Gross per 24 hour  Intake 245 ml  Output --  Net 245 ml   Filed Weights   08/20/24 0438 08/21/24 0601 08/22/24 0545  Weight: 101.5 kg 100.5 kg 100.3 kg     Physical examination:         General:  AAO x 3,  cooperative, no distress;   HEENT:  Normocephalic, PERRL, otherwise with in Normal limits   Neuro:  CNII-XII intact. , normal motor and sensation, reflexes intact   Lungs:   Clear to auscultation BL, Respirations unlabored,  No wheezes / crackles  Cardio:    S1/S2, RRR, No murmure, No Rubs or Gallops   Abdomen:  Soft, non-tender, bowel sounds active all four quadrants, no guarding or peritoneal signs.  Muscular  skeletal:  Limited exam -global generalized weaknesses - in bed, able to move all 4 extremities,   2+ pulses,  symmetric, RLE + 3   Skin:  Dry, warm to  touch, negative for any Rashes,  Wounds: Please see nursing documentation          Wounds: Right lateral lower extremity wound -please see nurses documentation       ------------------------------------------------------------------------------------------------------------------------------------------    LABs:     Latest Ref Rng & Units 08/22/2024    4:23 AM 08/21/2024    4:35 AM 08/20/2024    5:05 AM  CBC  WBC 4.0 - 10.5 K/uL 7.5  6.6  7.2   Hemoglobin 13.0 - 17.0 g/dL 86.0  86.1  86.6   Hematocrit 39.0 - 52.0 % 42.7  41.7  39.6   Platelets 150 - 400 K/uL 157  153  125       Latest Ref Rng & Units 08/22/2024    4:23 AM 08/21/2024    4:35 AM 08/20/2024    5:05 AM  CMP  Glucose 70 - 99 mg/dL 897  894  892   BUN 8 - 23 mg/dL 17  15  14    Creatinine 0.61 - 1.24 mg/dL 8.68  8.73  8.64   Sodium 135 - 145 mmol/L 140  142  141   Potassium 3.5 - 5.1 mmol/L 3.4  3.2  3.3   Chloride 98 - 111 mmol/L 107  108  108   CO2 22 - 32 mmol/L 25  26  25    Calcium  8.9 - 10.3 mg/dL 8.5  8.5  8.5        Micro Results Recent Results (from the past 240 hours)  Blood Culture (routine x 2)     Status: None (Preliminary result)   Collection Time: 08/18/24  9:30 AM   Specimen: BLOOD  Result Value Ref Range Status   Specimen Description BLOOD BLOOD RIGHT ARM  Final   Special Requests   Final    Blood Culture adequate volume BOTTLES DRAWN AEROBIC AND ANAEROBIC   Culture   Final    NO GROWTH 4 DAYS Performed at [facility], [address], [place], [place] [address]    Report Status PENDING  Incomplete  Blood Culture (routine x 2)     Status: Abnormal   Collection Time: 08/18/24  9:35 AM   Specimen: BLOOD  Result Value Ref Range Status   Specimen Description   Final    BLOOD RIGHT ANTECUBITAL Performed at [facility], [address] [place], [place] [address]    Special Requests   Final    BOTTLES DRAWN AEROBIC AND ANAEROBIC Blood Culture results may not be optimal due to an  inadequate volume of blood received in culture bottles Performed at [facility], [address] [place], [place] [address]    Culture  Setup Time   Final    ANAEROBIC BOTTLE ONLY GRAM POSITIVE COCCI Gram Stain Report Called to,Read Back By and Verified With: D. MISS RN 08/19/24 @0726  BY J. WHITE GRAM STAIN REVIEWED-AGREE WITH RESULT DRT CRITICAL RESULT CALLED TO, READ BACK BY AND VERIFIED WITH: PHARMD STEVEN HURTH ON 08/19/24 @ 1413 BY DRT Performed at [facility], [address] [place], [place] [address]    Culture METHICILLIN RESISTANT STAPHYLOCOCCUS AUREUS (A)  Final   Report Status 08/21/2024 FINAL  Final   Organism ID, Bacteria METHICILLIN RESISTANT STAPHYLOCOCCUS AUREUS  Final      Susceptibility   Methicillin resistant staphylococcus aureus - MIC*    CIPROFLOXACIN  >=8 RESISTANT Resistant     ERYTHROMYCIN >=8 RESISTANT Resistant     GENTAMICIN  <=0.5 SENSITIVE Sensitive     OXACILLIN >=4 RESISTANT Resistant     TETRACYCLINE <=1 SENSITIVE Sensitive     VANCOMYCIN  <=0.5 SENSITIVE Sensitive     TRIMETH/SULFA <=10 SENSITIVE Sensitive     CLINDAMYCIN  >=8 RESISTANT Resistant     RIFAMPIN <=0.5 SENSITIVE Sensitive     Inducible Clindamycin  NEGATIVE Sensitive     LINEZOLID  2 SENSITIVE Sensitive     * METHICILLIN RESISTANT STAPHYLOCOCCUS AUREUS  Blood Culture ID Panel (Reflexed)     Status:  Abnormal   Collection Time: 08/18/24  9:35 AM  Result Value Ref Range Status   Enterococcus faecalis NOT DETECTED NOT DETECTED Final   Enterococcus Faecium NOT DETECTED NOT DETECTED Final   Listeria monocytogenes NOT DETECTED NOT DETECTED Final   Staphylococcus species DETECTED (A) NOT DETECTED Final    Comment: CRITICAL RESULT CALLED TO, READ BACK BY AND VERIFIED WITH: PHARMD STEVEN HURTH ON 08/19/24 @ 1413 BY DRT    Staphylococcus aureus (BCID) DETECTED (A) NOT DETECTED Final    Comment: Methicillin (oxacillin)-resistant Staphylococcus aureus (MRSA). MRSA is predictably resistant to  beta-lactam antibiotics (except ceftaroline). Preferred therapy is vancomycin  unless clinically contraindicated. Patient requires contact precautions if  hospitalized. CRITICAL RESULT CALLED TO, READ BACK BY AND VERIFIED WITH: PHARMD STEVEN HURTH ON 08/19/24 @ 1413 BY DRT    Staphylococcus epidermidis NOT DETECTED NOT DETECTED Final   Staphylococcus lugdunensis NOT DETECTED NOT DETECTED Final   Streptococcus species NOT DETECTED NOT DETECTED Final   Streptococcus agalactiae NOT DETECTED NOT DETECTED Final   Streptococcus pneumoniae NOT DETECTED NOT DETECTED Final   Streptococcus pyogenes NOT DETECTED NOT DETECTED Final   A.calcoaceticus-baumannii NOT DETECTED NOT DETECTED Final   Bacteroides fragilis NOT DETECTED NOT DETECTED Final   Enterobacterales NOT DETECTED NOT DETECTED Final   Enterobacter cloacae complex NOT DETECTED NOT DETECTED Final   Escherichia coli NOT DETECTED NOT DETECTED Final   Klebsiella aerogenes NOT DETECTED NOT DETECTED Final   Klebsiella oxytoca NOT DETECTED NOT DETECTED Final   Klebsiella pneumoniae NOT DETECTED NOT DETECTED Final   Proteus species NOT DETECTED NOT DETECTED Final   Salmonella species NOT DETECTED NOT DETECTED Final   Serratia marcescens NOT DETECTED NOT DETECTED Final   Haemophilus influenzae NOT DETECTED NOT DETECTED Final   Neisseria meningitidis NOT DETECTED NOT DETECTED Final   Pseudomonas aeruginosa NOT DETECTED NOT DETECTED Final   Stenotrophomonas maltophilia NOT DETECTED NOT DETECTED Final   Candida albicans NOT DETECTED NOT DETECTED Final   Candida auris NOT DETECTED NOT DETECTED Final   Candida glabrata NOT DETECTED NOT DETECTED Final   Candida krusei NOT DETECTED NOT DETECTED Final   Candida parapsilosis NOT DETECTED NOT DETECTED Final   Candida tropicalis NOT DETECTED NOT DETECTED Final   Cryptococcus neoformans/gattii NOT DETECTED NOT DETECTED Final   Meth resistant mecA/C and MREJ DETECTED (A) NOT DETECTED Final    Comment:  CRITICAL RESULT CALLED TO, READ BACK BY AND VERIFIED WITH: PHARMD STEVEN HURTH ON 08/19/24 @ 1413 BY DRT Performed at [facility], [address], [place], [place] [address]   Culture, blood (Routine X 2) w Reflex to ID Panel     Status: None (Preliminary result)   Collection Time: 08/19/24 11:43 AM   Specimen: BLOOD  Result Value Ref Range Status   Specimen Description BLOOD BLOOD RIGHT ARM  Final   Special Requests   Final    BOTTLES DRAWN AEROBIC AND ANAEROBIC Blood Culture results may not be optimal due to an inadequate volume of blood received in culture bottles   Culture   Final    NO GROWTH 3 DAYS Performed at [facility], [address], [place], [place] [address]    Report Status PENDING  Incomplete  Culture, blood (Routine X 2) w Reflex to ID Panel     Status: None (Preliminary result)   Collection Time: 08/19/24 11:43 AM   Specimen: BLOOD  Result Value Ref Range Status   Specimen Description BLOOD BLOOD RIGHT HAND  Final   Special Requests  Final    Blood Culture results may not be optimal due to an inadequate volume of blood received in culture bottles BOTTLES DRAWN AEROBIC AND ANAEROBIC   Culture   Final    NO GROWTH 3 DAYS Performed at [facility], [address] [place], [place] [address]    Report Status PENDING  Incomplete    Radiology Reports No results found.   SIGNED: [name], MD, FHM. FAAFP. Jolynn Pack - [facility] Time spent - 55 min.  In seeing, evaluating and examining the patient. Reviewing medical records, labs, drawn plan of care. [facility],  Pager (please use [email or web address] to page/ text) Please use Epic Secure Chat for non-urgent communication (7AM-7PM)  If 7PM-7AM, please contact night-coverage [email or web address], 08/22/2024, 11:03 AM

## 2024-08-22 NOTE — TOC CM/SW Note (Signed)
 [facility] referral for wound care, new med teaching and possible home IV antibiotics sent to [facility]. Referral accepted.

## 2024-08-22 NOTE — Plan of Care (Signed)
   Problem: Fluid Volume: Goal: Hemodynamic stability will improve Outcome: Progressing

## 2024-08-22 NOTE — Plan of Care (Signed)
  Problem: Fluid Volume: Goal: Hemodynamic stability will improve Outcome: Progressing   Problem: Clinical Measurements: Goal: Diagnostic test results will improve Outcome: Progressing Goal: Signs and symptoms of infection will decrease Outcome: Progressing   Problem: Respiratory: Goal: Ability to maintain adequate ventilation will improve Outcome: Progressing   Problem: Education: Goal: Knowledge of General Education information will improve Description: Including pain rating scale, medication(s)/side effects and non-pharmacologic comfort measures Outcome: Progressing   Problem: Clinical Measurements: Goal: Ability to maintain clinical measurements within normal limits will improve Outcome: Progressing Goal: Will remain free from infection Outcome: Progressing Goal: Diagnostic test results will improve Outcome: Progressing Goal: Respiratory complications will improve Outcome: Progressing Goal: Cardiovascular complication will be avoided Outcome: Progressing   Problem: Activity: Goal: Risk for activity intolerance will decrease Outcome: Progressing   Problem: Nutrition: Goal: Adequate nutrition will be maintained Outcome: Progressing   Problem: Coping: Goal: Level of anxiety will decrease Outcome: Progressing   Problem: Safety: Goal: Ability to remain free from injury will improve Outcome: Progressing   Problem: Skin Integrity: Goal: Risk for impaired skin integrity will decrease Outcome: Progressing

## 2024-08-22 NOTE — Progress Notes (Signed)
 PHARMACY - ANTICOAGULATION CONSULT NOTE  Pharmacy Consult for Warfarin Indication: History of DVT  Allergies  Allergen Reactions   Orencia  [Abatacept ] Anaphylaxis    Had prostate cancer   Carbamazepine Rash    Makes drowsy Brand name is Tegretol    Celecoxib Itching and Rash    Brand name is Celebrex   Cephalexin Rash    Severe Rash. Brand name is Keflex Patient has tolerated ceftriaxone  05/2019   Enbrel [Etanercept] Rash   Humira [Adalimumab] Rash   Levofloxacin  Other (See Comments)    GI Intolerance Brand name is Levaquin    Sulfa Antibiotics Rash   Sulfasalazine Rash    Patient Measurements:  Wt: 99.8 kg (220 lb) Ht: 5' 9 (175.3 cm)  Vital Signs: Temp: 97.8 F (36.6 C) (08/24 0545) BP: 136/83 (08/24 0545) Pulse Rate: 61 (08/24 0545)  Labs: Recent Labs    08/20/24 0505 08/21/24 0435 08/22/24 0423  HGB 13.3 13.8 13.9  HCT 39.6 41.7 42.7  PLT 125* 153 157  LABPROT 28.2* 21.5* 20.9*  INR 2.5* 1.8* 1.7*  CREATININE 1.35* 1.26* 1.31*  CKTOTAL 227  --   --     Estimated Creatinine Clearance: 58.6 mL/min (A) (by C-G formula based on SCr of 1.31 mg/dL (H)).   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 Unknown origin.   Shortness of breath dyspnea    exertion    Sleep apnea    cpap    Tubular adenoma of colon 04/2008    Medications:   PTA pt taking warfarin (3 mg) at bedtime.   Assessment:  Pt is a 47 YOM  with PMH of HF, COPD, DVT. He presented to ED with c/o fever and right leg pain and swelling. Pt has chronic leg issues due to history of DVT and had a DVT ultrasound 2 weeks ago that was negative. Last week he completed course of abx for cellulitis of the right leg. Yesterday pt developed new acute pain in his right leg along with swelling and redness. Pt is currently taking warfarin. Pharmacy was consulted for warfarin dosing due to history of DVT.   INR 3.2 > 3.5>2.5>1.8>1.7 today. No bleeding noted. CBC stable. Will give extra warfarin again tonight. INR fall appears to have stabilized, would expect to see uptrend in am with extra warfarin being given.   Goal of Therapy:  INR 2-3 Monitor platelets by anticoagulation protocol: Yes   Plan:  Warfarin 6mg  tonight Continue to monitor INR daily  Dempsey Blush PharmD., BCPS Clinical Pharmacist 08/22/2024 8:04 AM

## 2024-08-23 ENCOUNTER — Encounter (HOSPITAL_COMMUNITY): Payer: Self-pay | Admitting: Family Medicine

## 2024-08-23 ENCOUNTER — Inpatient Hospital Stay (HOSPITAL_COMMUNITY): Payer: Self-pay | Admitting: Certified Registered Nurse Anesthetist

## 2024-08-23 ENCOUNTER — Inpatient Hospital Stay (HOSPITAL_COMMUNITY)

## 2024-08-23 ENCOUNTER — Encounter (HOSPITAL_COMMUNITY)
Admission: EM | Disposition: A | Discharge status: Home / Self Care | Payer: Self-pay | Source: Home / Self Care | Attending: Family Medicine

## 2024-08-23 ENCOUNTER — Other Ambulatory Visit: Payer: Self-pay

## 2024-08-23 ENCOUNTER — Other Ambulatory Visit (HOSPITAL_COMMUNITY): Payer: Self-pay

## 2024-08-23 ENCOUNTER — Telehealth (HOSPITAL_COMMUNITY): Payer: Self-pay

## 2024-08-23 DIAGNOSIS — N1831 Chronic kidney disease, stage 3a: Secondary | ICD-10-CM

## 2024-08-23 DIAGNOSIS — R7881 Bacteremia: Secondary | ICD-10-CM | POA: Diagnosis not present

## 2024-08-23 DIAGNOSIS — L0390 Cellulitis, unspecified: Secondary | ICD-10-CM

## 2024-08-23 DIAGNOSIS — I5032 Chronic diastolic (congestive) heart failure: Secondary | ICD-10-CM | POA: Diagnosis not present

## 2024-08-23 DIAGNOSIS — I130 Hypertensive heart and chronic kidney disease with heart failure and stage 1 through stage 4 chronic kidney disease, or unspecified chronic kidney disease: Secondary | ICD-10-CM | POA: Diagnosis not present

## 2024-08-23 DIAGNOSIS — B9562 Methicillin resistant Staphylococcus aureus infection as the cause of diseases classified elsewhere: Secondary | ICD-10-CM | POA: Diagnosis not present

## 2024-08-23 HISTORY — PX: TEE WITHOUT CARDIOVERSION: SHX5443

## 2024-08-23 LAB — BASIC METABOLIC PANEL WITH GFR
Anion gap: 6 (ref 5–15)
BUN: 16 mg/dL (ref 8–23)
CO2: 27 mmol/L (ref 22–32)
Calcium: 9 mg/dL (ref 8.9–10.3)
Chloride: 108 mmol/L (ref 98–111)
Creatinine, Ser: 1.23 mg/dL (ref 0.61–1.24)
GFR, Estimated: >60 mL/min (ref >60)
Glucose, Bld: 108 mg/dL — ABNORMAL HIGH (ref 70–99)
Potassium: 4.2 mmol/L (ref 3.5–5.1)
Sodium: 141 mmol/L (ref 135–145)

## 2024-08-23 LAB — GLUCOSE, CAPILLARY: Glucose-Capillary: 88 mg/dL (ref 70–99)

## 2024-08-23 LAB — CBC
HCT: 42.3 % (ref 39.0–52.0)
Hemoglobin: 13.7 g/dL (ref 13.0–17.0)
MCH: 29.1 pg (ref 26.0–34.0)
MCHC: 32.4 g/dL (ref 30.0–36.0)
MCV: 89.8 fL (ref 80.0–100.0)
Platelets: 161 K/uL (ref 150–400)
RBC: 4.71 MIL/uL (ref 4.22–5.81)
RDW: 15.2 % (ref 11.5–15.5)
WBC: 7 K/uL (ref 4.0–10.5)
nRBC: 0 % (ref 0.0–0.2)

## 2024-08-23 LAB — ECHO TEE

## 2024-08-23 LAB — PROTIME-INR
INR: 1.7 — ABNORMAL HIGH (ref 0.8–1.2)
Prothrombin Time: 21 s — ABNORMAL HIGH (ref 11.4–15.2)

## 2024-08-23 LAB — CULTURE, BLOOD (ROUTINE X 2)
Culture: NO GROWTH
Special Requests: ADEQUATE

## 2024-08-23 SURGERY — ECHOCARDIOGRAM, TRANSESOPHAGEAL
Anesthesia: General

## 2024-08-23 MED ORDER — MUPIROCIN 2 % EX OINT: TOPICAL_OINTMENT | Freq: Three times a day (TID) | CUTANEOUS | 0 refills | Status: AC

## 2024-08-23 MED ORDER — SODIUM CHLORIDE 0.9% FLUSH: 10.0000 mL | INTRAVENOUS | Status: DC | PRN

## 2024-08-23 MED ORDER — PHENYLEPHRINE 80 MCG/ML (10ML) SYRINGE FOR IV PUSH (FOR BLOOD PRESSURE SUPPORT)
PREFILLED_SYRINGE | INTRAVENOUS | Status: DC | PRN
  Administered 2024-08-23: 80 ug via INTRAVENOUS
  Administered 2024-08-23 (×2): 160 ug via INTRAVENOUS
  Administered 2024-08-23: 80 ug via INTRAVENOUS

## 2024-08-23 MED ORDER — PROPOFOL 10 MG/ML IV BOLUS
INTRAVENOUS | Status: AC
  Filled 2024-08-23: qty 20

## 2024-08-23 MED ORDER — SODIUM CHLORIDE 0.9% FLUSH: 10.0000 mL | Freq: Two times a day (BID) | INTRAVENOUS | Status: DC

## 2024-08-23 MED ORDER — LACTATED RINGERS IV SOLN: INTRAVENOUS | Status: DC | PRN

## 2024-08-23 MED ORDER — DAPTOMYCIN IV (FOR PTA / DISCHARGE USE ONLY): 700.0000 mg | INTRAVENOUS | 0 refills | Status: AC

## 2024-08-23 MED ORDER — MUPIROCIN 2 % EX OINT: TOPICAL_OINTMENT | Freq: Three times a day (TID) | CUTANEOUS | Status: DC

## 2024-08-23 MED ORDER — PROPOFOL 500 MG/50ML IV EMUL
INTRAVENOUS | Status: AC
  Filled 2024-08-23: qty 50

## 2024-08-23 MED ORDER — LIDOCAINE 2% (20 MG/ML) 5 ML SYRINGE
INTRAMUSCULAR | Status: AC
  Filled 2024-08-23: qty 5

## 2024-08-23 MED ORDER — PHENYLEPHRINE 80 MCG/ML (10ML) SYRINGE FOR IV PUSH (FOR BLOOD PRESSURE SUPPORT)
PREFILLED_SYRINGE | INTRAVENOUS | Status: AC
Start: 2024-08-23 — End: 2024-08-23
  Filled 2024-08-23: qty 10

## 2024-08-23 MED ORDER — WARFARIN SODIUM 2 MG PO TABS
3.0000 mg | ORAL_TABLET | Freq: Once | ORAL | Status: AC
  Administered 2024-08-23: 3 mg via ORAL
  Filled 2024-08-23: qty 1

## 2024-08-23 MED ORDER — PROPOFOL 10 MG/ML IV BOLUS
INTRAVENOUS | Status: DC | PRN
Start: 2024-08-23 — End: 2024-08-23
  Administered 2024-08-23: 70 mg via INTRAVENOUS
  Administered 2024-08-23: 125 ug/kg/min via INTRAVENOUS

## 2024-08-23 MED ORDER — DAPTOMYCIN-SODIUM CHLORIDE 700-0.9 MG/100ML-% IV SOLN: 700.0000 mg | Freq: Every day | INTRAVENOUS | 0 refills | Status: DC

## 2024-08-23 MED ORDER — LABETALOL HCL 100 MG PO TABS
100.0000 mg | ORAL_TABLET | Freq: Two times a day (BID) | ORAL | Status: DC
  Administered 2024-08-23: 100 mg via ORAL
  Filled 2024-08-23: qty 1

## 2024-08-23 MED ORDER — BUTAMBEN-TETRACAINE-BENZOCAINE 2-2-14 % EX AERO
INHALATION_SPRAY | CUTANEOUS | Status: AC
  Filled 2024-08-23: qty 5

## 2024-08-23 MED ORDER — LIDOCAINE 2% (20 MG/ML) 5 ML SYRINGE
INTRAMUSCULAR | Status: DC | PRN
Start: 2024-08-23 — End: 2024-08-23
  Administered 2024-08-23: 60 mg via INTRAVENOUS

## 2024-08-23 MED ORDER — LACTINEX PO CHEW: 1.0000 | CHEWABLE_TABLET | Freq: Three times a day (TID) | ORAL | 0 refills | Status: AC

## 2024-08-23 MED ORDER — CHLORHEXIDINE GLUCONATE CLOTH 2 % EX PADS: 6.0000 | MEDICATED_PAD | Freq: Every day | CUTANEOUS | Status: DC

## 2024-08-23 MED ORDER — SPIRONOLACTONE 50 MG PO TABS: 50.0000 mg | ORAL_TABLET | Freq: Every day | ORAL | 0 refills | Status: AC

## 2024-08-23 NOTE — Progress Notes (Signed)
*  PRELIMINARY RESULTS* Echocardiogram Echocardiogram Transesophageal has been performed.  Teresa Aida PARAS 08/23/2024, 2:16 PM

## 2024-08-23 NOTE — Progress Notes (Signed)
 RN transported patient to room 330.  Patient tolerated procedure well.

## 2024-08-23 NOTE — Transfer of Care (Signed)
 Immediate Anesthesia Transfer of Care Note  Patient: [name]  Procedure(s) Performed: ECHOCARDIOGRAM, TRANSESOPHAGEAL  Patient Location: PACU  Anesthesia Type:General  Level of Consciousness: drowsy and patient cooperative  Airway & Oxygen  Therapy: Patient Spontanous Breathing and Patient connected to nasal cannula oxygen   Post-op Assessment: Report given to RN and Post -op Vital signs reviewed and stable  Post vital signs: Reviewed and stable  Last Vitals:  Vitals Value Taken Time  BP 109/43 08/23/24   1401  Temp 97.8 08/23/24   1401  Pulse 64 08/23/24   1401  Resp 16 08/23/24   1401  SpO2 97% 08/23/24   1401    Last Pain:  Vitals:   08/23/24 1250  TempSrc: Oral  PainSc: 0-No pain      Patients Stated Pain Goal: 2 (08/18/24 0843)  Complications: No notable events documented.

## 2024-08-23 NOTE — Progress Notes (Signed)
   [facility] has been requested to perform a transesophageal echocardiogram on [name] for bactermia.     The patient does NOT have any absolute or relative contraindications to a Transesophageal Echocardiogram (TEE).  The patient has: No other conditions that may impact this procedure.    After careful review of history and examination, the risks and benefits of transesophageal echocardiogram have been explained including risks of esophageal damage, perforation (1:10,000 risk), bleeding, pharyngeal hematoma as well as other potential complications associated with conscious sedation including aspiration, arrhythmia, respiratory failure and death. Alternatives to treatment were discussed, questions were answered. Patient is willing to proceed.   Signed, Lorette CINDERELLA Kapur, PA-C  08/23/2024 8:17 AM

## 2024-08-23 NOTE — TOC Transition Note (Signed)
 Transition of Care [facility]) - Discharge Note   Patient Details  Name: [name] MRN: [id number] Date of Birth: [birth year]  [facility] CM/SW Contact:  Hoy DELENA Bigness, LCSW Phone Number: 08/23/2024, 3:14 PM   Clinical Narrative:    Pt recommended for 2 week course of IV abx. Referral made to [facility] w/ [facility] for home IV abx. Changed HH agency to [facility] as [facility] will not cover IV abx supplies for pt due to insurance. [facility] to have RN meet with pt in home tomorrow for first home dose and to provide education. HH and OPAT orders are in place. Pt's spouse to provide transportation home. No further TOC needs identified.   Final next level of care: Home w Home Health Services Barriers to Discharge: Barriers Resolved   Patient Goals and CMS Choice Patient states their goals for this hospitalization and ongoing recovery are:: Return home CMS Medicare.gov Compare Post Acute Care list provided to:: Patient Choice offered to / list presented to : Patient [place] ownership interest in [facility].provided to::  (N/A)    Discharge Placement                       Discharge Plan and Services Additional resources added to the After Visit Summary for   In-house Referral: Clinical Social Work Discharge Planning Services: CM Consult            DME Arranged: IV pump/equipment DME Agency: Other - Comment [facility]) Date DME Agency Contacted: 08/23/24 Time DME Agency Contacted: [phone] Representative spoke with at DME Agency: [facility] HH Arranged: RN HH Agency: [facility] Date [facility] Agency Contacted: 08/22/24   Representative spoke with at [facility] Agency: [facility]  Social Drivers of Health (SDOH) Interventions SDOH Screenings   Food Insecurity: No Food Insecurity (08/19/2024)  Housing: Low Risk  (08/21/2024)  Transportation Needs: No Transportation Needs (08/19/2024)  Utilities: Not At Risk (08/19/2024)  Depression (PHQ2-9): Low Risk  (06/14/2019)  Financial  Resource Strain: Low Risk  (12/16/2022)  Physical Activity: Inactive (11/16/2019)  Social Connections: Moderately Integrated (08/19/2024)  Stress: No Stress Concern Present (11/16/2019)  Tobacco Use: Medium Risk (08/23/2024)     Readmission Risk Interventions    08/19/2024   12:00 PM 01/24/2023    8:16 AM  Readmission Risk Prevention Plan  Transportation Screening Complete Complete  PCP or Specialist Appt within 3-5 Days Complete   HRI or Home Care Consult Complete Complete  Social Work Consult for Recovery Care Planning/Counseling Complete Complete  Palliative Care Screening Not Applicable Not Applicable  Medication Review Oceanographer) Complete Complete

## 2024-08-23 NOTE — Anesthesia Procedure Notes (Signed)
 Date/Time: 08/23/2024 1:39 PM  Performed by: Para Jerelene CROME, CRNAOxygen Delivery Method: Nasal cannula Comments: OptiFlow Nasal Cannula.

## 2024-08-23 NOTE — Plan of Care (Signed)
  Problem: Fluid Volume: Goal: Hemodynamic stability will improve Outcome: Progressing   Problem: Clinical Measurements: Goal: Diagnostic test results will improve Outcome: Progressing Goal: Signs and symptoms of infection will decrease Outcome: Progressing   Problem: Respiratory: Goal: Ability to maintain adequate ventilation will improve Outcome: Progressing   Problem: Education: Goal: Knowledge of General Education information will improve Description: Including pain rating scale, medication(s)/side effects and non-pharmacologic comfort measures Outcome: Progressing   Problem: Health Behavior/Discharge Planning: Goal: Ability to manage health-related needs will improve Outcome: Progressing   Problem: Clinical Measurements: Goal: Ability to maintain clinical measurements within normal limits will improve Outcome: Progressing Goal: Will remain free from infection Outcome: Progressing Goal: Diagnostic test results will improve Outcome: Progressing Goal: Respiratory complications will improve Outcome: Progressing Goal: Cardiovascular complication will be avoided Outcome: Progressing   Problem: Activity: Goal: Risk for activity intolerance will decrease Outcome: Progressing   Problem: Nutrition: Goal: Adequate nutrition will be maintained Outcome: Progressing   Problem: Coping: Goal: Level of anxiety will decrease Outcome: Progressing   Problem: Elimination: Goal: Will not experience complications related to bowel motility Outcome: Progressing Goal: Will not experience complications related to urinary retention Outcome: Progressing   Problem: Pain Managment: Goal: General experience of comfort will improve and/or be controlled Outcome: Progressing   Problem: Safety: Goal: Ability to remain free from injury will improve Outcome: Progressing   Problem: Skin Integrity: Goal: Risk for impaired skin integrity will decrease Outcome: Progressing   Problem:  Clinical Measurements: Goal: Ability to avoid or minimize complications of infection will improve Outcome: Progressing   Problem: Skin Integrity: Goal: Skin integrity will improve Outcome: Progressing

## 2024-08-23 NOTE — Care Management Important Message (Signed)
 Important Message  Patient Details  Name: [name] MRN: [id number] Date of Birth: May 16, 1951   Important Message Given:  Yes - Medicare IM     [name] 08/23/2024, 4:59 PM

## 2024-08-23 NOTE — Discharge Summary (Signed)
 Physician Discharge Summary   Patient: [name] MRN: [id number] DOB: [birth year]  Admit date:     08/18/2024  Discharge date: 08/23/24  Discharge Physician: [name]   PCP: Marvine Rush, MD   Recommendations at discharge:   Follow with the PCP in 1-2 weeks Follow-up with infectious disease team at [facility] CBC BMP q. weekly results to PCP and infectious disease team Recommending close follow-up with PCP regarding current medication and possible modification near future Continue reaching out to pharmacy for possible substituting warfarin for Eliquis if financially feasible  Continue home health, continue wound care per instructions  Planning to place PICC line on 08/23/2024 PICC line to be discontinued 09/02/2024 after last dose of antibiotics on 09/02/2024  Discharge Diagnoses: Principal Problem:   Sepsis (HCC) Active Problems:   Chronic anticoagulation   Personal history of DVT (deep vein thrombosis)   Weakness   GERD   Rheumatoid arthritis (HCC)   Malignant neoplasm of prostate (HCC)   DVT (deep venous thrombosis) (HCC)   COPD with acute exacerbation (HCC)   History of COPD   History of seizure disorder   Primary osteoarthritis of both feet   Dyspnea on exertion   OSA (obstructive sleep apnea)   Class 2 severe obesity due to excess calories with serious comorbidity and body mass index (BMI) of 35.0 to 35.9 in adult (HCC)   Chronic diastolic HF (heart failure) (HCC)   Stage 3a chronic kidney disease (CKD) (HCC)   MRSA bacteremia   MRSA infection  Resolved Problems:   * No resolved hospital problems. *  Hospital Course: [name] is a 73 year old male with extensive history of rheumatoid arthritis osteoarthritis, chronic DVT on Coumadin , diastolic CHF, COPD, gout, GERD..  Presented to ED with chief complaint of right leg pain and fever.  Reported has been going on for past 2 weeks.  Chronic right lower extremity edema.  Ultrasound 2 weeks ago was  negative for DVT.  Otherwise is progressively becoming more erythematous with edema. Tmax at home 100.6.  No other complaints.   ED evaluation: Blood pressure (!) 153/83, pulse 78, temperature 97.9 F (36.6 C), temperature source Oral, resp. rate 17, SpO2 96%.  Abnormal labs: WBC 18, platelets 142, creatinine 1.59, INR 3.2, Cr 2.3>> 1.6  Lower extremity Doppler study: Right soft tissue swelling, cellulitis, left: Partially occlusive left popliteal vein thrombus with lack of complete compressibilit   Patient was given broad-spectrum antibiotics Rocephin , IV fluids requested to be admitted for cellulitis ----------------------------------------------------------------------------------------------------------- Sepsis (HCC)/bacteremia MRSA bacteremia Source of infection right lower extremity wound/cellulitis   - Resolved sepsis physiology, hemodynamically stable   - Blood culture from 8/20  positive for Staph aureus (MRSA) - 8/21 Echocardiogram-poor visualization otherwise negative no signs of vegetation or endocarditis - 08/19/2024 consult infectious disease team-IV ABX Zyvox  switched to daptomycin  - Repeating blood cultures--no growth to date   - Per ID recommendation, discussed with cardiology anticipating TEE  today 08/23/2024 -normal limits-negative for any vegetation or other abnormalities   Per ID recommended continuing Daptomycin  for total of 21 days  starting date 08/19/2024-ending date 09/02/2024  Planning to place PICC line on 08/23/2024 PICC line to be discontinued 09/02/2024 after last dose of antibiotics on 09/02/2024    POA: Right lower extremity cellulitis -with open wound, negative for DVT POA: Patient meets sepsis due to cellulitis-  w leukocytosis WBC 18, lactic acid 2.3, creatinine 1.59, RR 21 -Immunocompromised due to rheumatoid arthritis-medication -S/p IV fluid resuscitation, IV antibiotics  Active medical problems:  Hypokalemia -monitoring repleted     Chronic anticoagulation - Chronically anticoagulated for chronic DVT on Coumadin   - INR at 1.7  (INR goal 2 - 3) - Please follow-up with pharmacy, if financially feasible okay to transition from Coumadin  to Eliquis   Weakness - Consulting PT OT    Acute on chronic stage 3a chronic kidney disease (CKD) (HCC) - Continue to improve - Monitoring closely, avoiding nephrotoxins, avoiding hypotension -Mildly elevated BUN/creatinine from baseline   Recent Labs       Lab Results  Component Value Date    CREATININE 1.23 08/23/2024    CREATININE 1.31 (H) 08/22/2024    CREATININE 1.26 (H) 08/21/2024        estimated creatinine clearance is 48.2 mL/min (A) (by C-G formula based on SCr of 1.59 mg/dL (H)).     Chronic diastolic HF (heart failure):   -Hemodynamically stable - Continue monitoring daily weight -Last echo March 2023 EGF 55-60% normal LV function normal RV function valves within normal limits, no LVH - Resume Lasix  at 80 mg daily-recommending close follow-up with PCP, to reduce dose if possible   Class 2 severe obesity due to excess calories with serious comorbidity and body mass index (BMI) of 35.0 to 35.9 in adult: - Height 5'9, weight 99.8 kg, BMI 32.49 kg/m -Discussed with patient, advised follow-up with PCP regarding outpatient weight loss programs   OSA (obstructive sleep apnea) -continue as needed CPAP   Dyspnea on exertion -continue inhalers as needed nebs    Primary osteoarthritis of both feet - Continue current meds,   History of seizure disorder -stable continue phenytoin      History of COPD-stable no signs of exacerbation -as needed DuoNeb   DVT (deep venous thrombosis) (HCC) - Chronically on Coumadin , bilateral lower extremity Doppler studies still revealing left lower extremity DVT, right cellulitic changes -INR 1.7    Malignant neoplasm of prostate (HCC) - Follow-up with urologist as outpatient   Rheumatoid arthritis (HCC) - On Tocilizumab     GERD  - Continue PPI     ----------------------------------------------------------------------------------------------------------- Nutritional status:  The patient's BMI is: Body mass index is 32.88 kg/m. I agree with the assessment and plan as outlined    ----------------------------------------------------------------------------------------------------------- Cultures; 08/18/24  Blood Cultures x 2 >> MRSA 08/19/24 repeat blood cultures--no growth to date     8/20 - 8/21 - Zyvox  8/21 - IV Cubicin  for total of 14 days, ending date 09/03/2023   Consultants: Infectious disease team/cardiology Procedures performed: TTE, TEE, blood cultures Disposition: Home with home health Diet recommendation:  Discharge Diet Orders (From admission, onward)     Start     Ordered   08/19/24 0000  Diet - low sodium heart healthy        08/19/24 1048           Cardiac and Carb modified diet DISCHARGE MEDICATION: Allergies as of 08/23/2024       Reactions   Orencia  [abatacept ] Anaphylaxis   Had prostate cancer   Carbamazepine Rash   Makes drowsy Brand name is Tegretol    Celecoxib Itching, Rash   Brand name is Celebrex   Cephalexin Rash   Severe Rash. Brand name is Keflex Patient has tolerated ceftriaxone  05/2019   Enbrel [etanercept] Rash   Humira [adalimumab] Rash   Levofloxacin  Other (See Comments)   GI Intolerance Brand name is Levaquin    Sulfa Antibiotics Rash   Sulfasalazine Rash        Medication List  TAKE these medications    ACTEMRA  IV Inject into the vein every 28 (twenty-eight) days.   allopurinol  100 MG tablet Commonly known as: ZYLOPRIM  Take 1 tablet (100 mg total) by mouth 2 (two) times daily.   amLODipine  10 MG tablet Commonly known as: NORVASC  Take 1 tablet (10 mg total) by mouth daily.   ascorbic acid 500 MG tablet Commonly known as: VITAMIN C Take 500 mg by mouth at bedtime.   Breztri  Aerosphere 160-9-4.8 MCG/ACT Aero inhaler Generic drug:  budesonide -glycopyrrolate -formoterol  Inhale 2 puffs into the lungs in the morning.   cholecalciferol 25 MCG (1000 UNIT) tablet Commonly known as: VITAMIN D3 Take 1,000 Units by mouth at bedtime.   daptomycin  700-0.9 MG/100ML-% Soln Commonly known as: CUBICIN  Inject 100 mLs (700 mg total) into the vein daily at 2 PM for 14 days.   dicyclomine  10 MG capsule Commonly known as: BENTYL  TAKE 1 CAPSULE BY MOUTH 4 TIMES DAILY BEFORE MEALS AND AT BEDTIME What changed: See the new instructions.   furosemide  80 MG tablet Commonly known as: LASIX  TAKE 1 TABLET BY MOUTH EVERY DAY What changed: when to take this   lactobacillus acidophilus & bulgar chewable tablet Chew 1 tablet by mouth 3 (three) times daily with meals for 14 days.   leflunomide  20 MG tablet Commonly known as: ARAVA  TAKE 1 TABLET BY MOUTH EVERY DAY   mupirocin  ointment 2 % Commonly known as: BACTROBAN  Apply topically 3 (three) times daily. What changed:  how much to take when to take this   omeprazole  40 MG capsule Commonly known as: PRILOSEC Take 1 capsule (40 mg total) by mouth daily. What changed: when to take this   oxybutynin  10 MG 24 hr tablet Commonly known as: DITROPAN -XL Take 1 tablet (10 mg total) by mouth daily. What changed: when to take this   phenytoin  100 MG ER capsule Commonly known as: DILANTIN  Take 100-200 mg by mouth 2 (two) times daily. Take 1 capsule in the morning and 2 capsules at bedtime   Potassium Chloride  ER 20 MEQ Tbcr Take 20 mEq by mouth daily.   rosuvastatin  10 MG tablet Commonly known as: CRESTOR  Take 10 mg by mouth at bedtime.   spironolactone  50 MG tablet Commonly known as: ALDACTONE  Take 1 tablet (50 mg total) by mouth daily. Start taking on: August 24, 2024   warfarin 4 MG tablet Commonly known as: COUMADIN  Take 4 mg by mouth at bedtime.               Discharge Care Instructions  (From admission, onward)           Start     Ordered   08/23/24 0000   Change dressing on IV access line weekly and PRN  (Home infusion instructions - [facility] )        08/23/24 1458   08/19/24 0000  Discharge wound care:       Comments: Per instructions from wound care nurse   08/19/24 1048            Discharge Exam: Filed Weights   08/22/24 0545 08/23/24 0547 08/23/24 1250  Weight: 100.3 kg 101 kg 101 kg        General:  AAO x 3,  cooperative, no distress;   HEENT:  Normocephalic, PERRL, otherwise with in Normal limits   Neuro:  CNII-XII intact. , normal motor and sensation, reflexes intact   Lungs:   Clear to auscultation BL, Respirations unlabored,  No wheezes / crackles  Cardio:    S1/S2, RRR, No murmure, No Rubs or Gallops   Abdomen:  Soft, non-tender, bowel sounds active all four quadrants, no guarding or peritoneal signs.  Muscular  skeletal:  Limited exam -global generalized weaknesses - in bed, able to move all 4 extremities,   2+ pulses,  symmetric, right lower extremity edema erythema with wound  Skin:  Dry, warm to touch, negative for any Rashes,  Wounds: Right lower extremity lateral open superficial wound, draining, dressing in place positive edema erythema-improved from admission          Condition at discharge: fair  The results of significant diagnostics from this hospitalization (including imaging, microbiology, ancillary and laboratory) are listed below for reference.   Imaging Studies: US  EKG SITE RITE Result Date: 08/23/2024 If Site Rite image not attached, placement could not be confirmed due to current cardiac rhythm.  ECHO TEE Result Date: 08/23/2024    TRANSESOPHOGEAL ECHO REPORT   Patient Name:   [name] Date of Exam: 08/23/2024 Medical Rec #:  [id number]        Height:       69.0 in Accession #:    [id number]       Weight:       222.7 lb Date of Birth:  1951/04/13       BSA:          2.162 m Patient Age:    72 years         BP:           144/74 mmHg Patient Gender: M                HR:            79 bpm. Exam [facility] Procedure: Transesophageal Echo, Cardiac Doppler and Color Doppler (Both            Spectral and Color Flow Doppler were utilized during procedure). Indications:    Bacteremia R78.81  History:        Patient has prior history of Echocardiogram examinations, most                 recent 08/19/2024. CHF, COPD; Risk Factors:Sleep Apnea and                 Dyslipidemia. Prostate cancer (HCC).  Sonographer:    Aida Pizza RCS Referring Phys: [id number] LORETTE CINDERELLA KAPUR PROCEDURE: The transesophogeal probe was passed without difficulty through the esophogus of the patient. Sedation performed by performing physician. The patient developed no complications during the procedure.  IMPRESSIONS  1. Left ventricular ejection fraction, by estimation, is 60 to 65%. The left ventricle has normal function. The left ventricle has no regional wall motion abnormalities.  2. Right ventricular systolic function is normal. The right ventricular size is normal.  3. No left atrial/left atrial appendage thrombus was detected. The LAA emptying velocity was 60 cm/s.  4. The mitral valve is normal in structure. No evidence of mitral valve regurgitation. No evidence of mitral stenosis.  5. The aortic valve is tricuspid. There is mild calcification of the aortic valve. There is mild thickening of the aortic valve. Aortic valve regurgitation is not visualized. No aortic stenosis is present.  6. Some resistance advancing the probe in the trangastric views, limited views from transgastric position. Conclusion(s)/Recommendation(s): No evidence of vegetation/infective endocarditis on this transesophageael echocardiogram. FINDINGS  Left Ventricle: Left ventricular ejection fraction, by estimation, is 60 to 65%. The left ventricle has  normal function. The left ventricle has no regional wall motion abnormalities. The left ventricular internal cavity size was normal in size. Right Ventricle: The right ventricular  size is normal. Right vetricular wall thickness was not well visualized. Right ventricular systolic function is normal. Left Atrium: Left atrial size was normal in size. No left atrial/left atrial appendage thrombus was detected. The LAA emptying velocity was 60 cm/s. Right Atrium: Right atrial size was normal in size. Pericardium: There is no evidence of pericardial effusion. Mitral Valve: The mitral valve is normal in structure. No evidence of mitral valve regurgitation. No evidence of mitral valve stenosis. Tricuspid Valve: The tricuspid valve is normal in structure. Tricuspid valve regurgitation is not demonstrated. No evidence of tricuspid stenosis. Aortic Valve: The aortic valve is tricuspid. There is mild calcification of the aortic valve. There is mild thickening of the aortic valve. Aortic valve regurgitation is not visualized. No aortic stenosis is present. Pulmonic Valve: The pulmonic valve was normal in structure. Pulmonic valve regurgitation is not visualized. No evidence of pulmonic stenosis. Aorta: The aortic root is normal in size and structure. IAS/Shunts: No atrial level shunt detected by color flow Doppler.   AORTA Ao Root diam: 3.50 cm Dorn Ross MD Electronically signed by Dorn Ross MD Signature Date/Time: 08/23/2024/2:34:09 PM    Final    ECHOCARDIOGRAM COMPLETE Result Date: 08/19/2024    ECHOCARDIOGRAM REPORT   Patient Name:   [name] Date of Exam: 08/19/2024 Medical Rec #:  [id number]        Height:       69.0 in Accession #:    [id number]       Weight:       229.3 lb Date of Birth:  1951-07-21       BSA:          2.190 m Patient Age:    72 years         BP:           141/71 mmHg Patient Gender: M                HR:           73 bpm. [facility] Procedure: 2D Echo, Cardiac Doppler, Color Doppler and Intracardiac            Opacification Agent (Both Spectral and Color Flow Doppler were            utilized during procedure). Indications:    Bacteremia   History:        Patient has prior history of Echocardiogram examinations.                 Signs/Symptoms:Bacteremia.  Sonographer:    Vella Key Referring Phys: [phone] Eulah Walkup A Celena Lanius  Sonographer Comments: Technically difficult study due to poor echo windows. IMPRESSIONS  1. Poor Echo windows for evaluation of infective endocarditis. Valves not well visualized. Consider TEE if clinically indicated.  2. No LV thrombus by Definity . Left ventricular ejection fraction, by estimation, is 70 to 75%. The left ventricle has hyperdynamic function. The left ventricle has no regional wall motion abnormalities. Left ventricular diastolic parameters are consistent with Grade I diastolic dysfunction (impaired relaxation).  3. Right ventricular systolic function was not well visualized. The right ventricular size is normal. Tricuspid regurgitation signal is inadequate for assessing PA pressure.  4. The mitral valve was not well visualized. No evidence of mitral valve regurgitation. No evidence of mitral stenosis.  5. The aortic  valve was not well visualized. Aortic valve regurgitation is not visualized. No aortic stenosis is present. FINDINGS  Left Ventricle: No LV thrombus by Definity . Left ventricular ejection fraction, by estimation, is 70 to 75%. The left ventricle has hyperdynamic function. The left ventricle has no regional wall motion abnormalities. Definity  contrast agent was given IV  to delineate the left ventricular endocardial borders. Strain was performed and the global longitudinal strain is indeterminate. The left ventricular internal cavity size was normal in size. There is no left ventricular hypertrophy. Left ventricular diastolic parameters are consistent with Grade I diastolic dysfunction (impaired relaxation). Normal left ventricular filling pressure. Right Ventricle: The right ventricular size is normal. No increase in right ventricular wall thickness. Right ventricular systolic function was not well  visualized. Tricuspid regurgitation signal is inadequate for assessing PA pressure. Left Atrium: Left atrial size was normal in size. Right Atrium: Right atrial size was normal in size. Pericardium: There is no evidence of pericardial effusion. Mitral Valve: The mitral valve was not well visualized. No evidence of mitral valve regurgitation. No evidence of mitral valve stenosis. Tricuspid Valve: The tricuspid valve is not well visualized. Tricuspid valve regurgitation is not demonstrated. No evidence of tricuspid stenosis. Aortic Valve: The aortic valve was not well visualized. Aortic valve regurgitation is not visualized. No aortic stenosis is present. Pulmonic Valve: The pulmonic valve was not well visualized. Pulmonic valve regurgitation is not visualized. No evidence of pulmonic stenosis. Aorta: The aortic root is normal in size and structure. Venous: The inferior vena cava was not well visualized. IAS/Shunts: No atrial level shunt detected by color flow Doppler. Additional Comments: 3D was performed not requiring image post processing on an independent workstation and was indeterminate.   LV Volumes (MOD) LV vol d, MOD A2C: 48.6 ml Diastology LV vol d, MOD A4C: 36.0 ml LV e' medial:    8.27 cm/s LV vol s, MOD A2C: 8.1 ml  LV E/e' medial:  8.1 LV vol s, MOD A4C: 6.0 ml  LV e' lateral:   6.09 cm/s LV SV MOD A2C:     40.5 ml LV E/e' lateral: 11.0 LV SV MOD A4C:     36.0 ml LV SV MOD BP:      37.0 ml RIGHT VENTRICLE RV Basal diam:  3.20 cm RV S prime:     11.60 cm/s TAPSE (M-mode): 3.6 cm LEFT ATRIUM           Index        RIGHT ATRIUM           Index LA Vol (A2C): 53.0 ml 24.21 ml/m  RA Area:     18.30 cm LA Vol (A4C): 62.5 ml 28.55 ml/m  RA Volume:   53.80 ml  24.57 ml/m  AORTIC VALVE LVOT Vmax:   75.00 cm/s LVOT Vmean:  55.100 cm/s LVOT VTI:    0.176 m MITRAL VALVE MV Area (PHT): 4.83 cm    SHUNTS MV Decel Time: 157 msec    Systemic VTI: 0.18 m MV E velocity: 66.90 cm/s MV A velocity: 76.80 cm/s MV E/A  ratio:  0.87 Vishnu Priya Mallipeddi Electronically signed by Diannah Late Mallipeddi Signature Date/Time: 08/19/2024/4:45:53 PM    Final    US  ARTERIAL ABI (SCREENING LOWER EXTREMITY) Result Date: 08/19/2024 CLINICAL DATA:  Edema. EXAM: NONINVASIVE PHYSIOLOGIC VASCULAR STUDY OF BILATERAL LOWER EXTREMITIES TECHNIQUE: Evaluation of both lower extremities were performed at rest, including calculation of ankle-brachial indices with single level pressure measurements and doppler recording. COMPARISON:  None  available. FINDINGS: Right ABI:  1.03 Left ABI:  1.14 Right Lower Extremity:  Normal arterial waveforms at the ankle. Left Lower Extremity:  Normal arterial waveforms at the ankle. 1.0-1.4 Normal IMPRESSION: No evidence of significant lower extremity peripheral arterial disease Electronically Signed   By: Aliene Lloyd M.D.   On: 08/19/2024 12:58   US  Venous Img Lower Bilateral Addendum Date: 08/18/2024 ADDENDUM REPORT: 08/18/2024 11:38 ADDENDUM: Critical Value/emergent results were called by telephone at the time of interpretation by Dr Duwaine Severs on August 18, 2024 to provider Dr. Freddi who verbally acknowledged these results. Electronically Signed   By: Megan  Zare M.D.   On: 08/18/2024 11:38   Result Date: 08/18/2024 CLINICAL DATA:  Chronic lower extremity edema EXAM: Bilateral LOWER EXTREMITY VENOUS DOPPLER ULTRASOUND TECHNIQUE: Gray-scale sonography with compression, as well as color and duplex ultrasound, were performed to evaluate the deep venous system(s) from the level of the common femoral vein through the popliteal and proximal calf veins. COMPARISON:  July 26, 2024 FINDINGS: VENOUS There is a partially occlusive thrombus within the normal-sized left popliteal vein with lack of complete compressibility. Otherwise there is normal compressibility of the common femoral, superficial femoral, and popliteal veins, as well as the remainder of the visualized calf veins. Visualized portions of profunda  femoral vein and great saphenous vein unremarkable. No filling defects to suggest DVT on grayscale or color Doppler imaging within the remainder of the vessels. Doppler waveforms show normal direction of venous flow, normal respiratory plasticity and response to augmentation. OTHER Bilateral lower extremity soft tissue edema distal to the knee. Limitations: none IMPRESSION: Partially occlusive left popliteal vein thrombus with lack of complete compressibility. No DVT on the right. Soft tissue edema in bilateral lower extremity distal to the knee. Electronically Signed: By: Megan  Zare M.D. On: 08/18/2024 11:32   DG Chest Port 1 View Result Date: 08/18/2024 CLINICAL DATA:  Fever. EXAM: PORTABLE CHEST 1 VIEW COMPARISON:  December 26, 2023. FINDINGS: Stable cardiomediastinal silhouette. Stable elevated left hemidiaphragm. No definite acute pulmonary disease is noted. Bony thorax is unremarkable. IMPRESSION: No active disease. Electronically Signed   By: Lynwood Landy Raddle M.D.   On: 08/18/2024 09:36   DG Tibia/Fibula Right Result Date: 08/18/2024 CLINICAL DATA:  Right lower extremity pain and swelling for several weeks. EXAM: RIGHT TIBIA AND FIBULA - 2 VIEW COMPARISON:  None Available. FINDINGS: There is no evidence of fracture or other focal bone lesions. Soft tissues are unremarkable. IMPRESSION: Negative. Electronically Signed   By: Lynwood Landy Raddle M.D.   On: 08/18/2024 09:34   US  Venous Img Lower Unilateral Right (DVT) Result Date: 07/26/2024 CLINICAL DATA:  Chronic leg edema for 10 years EXAM: Right LOWER EXTREMITY VENOUS DOPPLER ULTRASOUND TECHNIQUE: Gray-scale sonography with compression, as well as color and duplex ultrasound, were performed to evaluate the deep venous system(s) from the level of the common femoral vein through the popliteal and proximal calf veins. COMPARISON:  None Available. FINDINGS: VENOUS Normal compressibility of the common femoral, superficial femoral, and popliteal veins, as well  as the visualized calf veins. Visualized portions of profunda femoral vein and great saphenous vein unremarkable. No filling defects to suggest DVT on grayscale or color Doppler imaging. Doppler waveforms show normal direction of venous flow, normal respiratory plasticity and response to augmentation. Limited views of the contralateral common femoral vein are unremarkable. OTHER None. Limitations: none IMPRESSION: No evidence of right lower extremity DVT. Electronically Signed   By: Ranell Bring M.D.   On: 07/26/2024  10:37    Microbiology: Results for orders placed or performed during the hospital encounter of 08/18/24  Blood Culture (routine x 2)     Status: None   Collection Time: 08/18/24  9:30 AM   Specimen: BLOOD  Result Value Ref Range Status   Specimen Description BLOOD BLOOD RIGHT ARM  Final   Special Requests   Final    Blood Culture adequate volume BOTTLES DRAWN AEROBIC AND ANAEROBIC   Culture   Final    NO GROWTH 5 DAYS Performed at [facility], [address], [place], [place] [address]    Report Status 08/23/2024 FINAL  Final  Blood Culture (routine x 2)     Status: Abnormal   Collection Time: 08/18/24  9:35 AM   Specimen: BLOOD  Result Value Ref Range Status   Specimen Description   Final    BLOOD RIGHT ANTECUBITAL Performed at [facility], [address], [place], [place] [address]    Special Requests   Final    BOTTLES DRAWN AEROBIC AND ANAEROBIC Blood Culture results may not be optimal due to an inadequate volume of blood received in culture bottles Performed at [facility], [address], [place], [place] [address]    Culture  Setup Time   Final    ANAEROBIC BOTTLE ONLY GRAM POSITIVE COCCI Gram Stain Report Called to,Read Back By and Verified With: D. MISS RN 08/19/24 @0726  BY J. WHITE GRAM STAIN REVIEWED-AGREE WITH RESULT DRT CRITICAL RESULT CALLED TO, READ BACK BY AND VERIFIED WITH: PHARMD STEVEN HURTH ON 08/19/24 @ 1413 BY DRT Performed at [facility], [address], [place], [place] [address]    Culture METHICILLIN RESISTANT STAPHYLOCOCCUS AUREUS (A)  Final   Report Status 08/21/2024 FINAL  Final   Organism ID, Bacteria METHICILLIN RESISTANT STAPHYLOCOCCUS AUREUS  Final      Susceptibility   Methicillin resistant staphylococcus aureus - MIC*    CIPROFLOXACIN  >=8 RESISTANT Resistant     ERYTHROMYCIN >=8 RESISTANT Resistant     GENTAMICIN  <=0.5 SENSITIVE Sensitive     OXACILLIN >=4 RESISTANT Resistant     TETRACYCLINE <=1 SENSITIVE Sensitive     VANCOMYCIN  <=0.5 SENSITIVE Sensitive     TRIMETH/SULFA <=10 SENSITIVE Sensitive     CLINDAMYCIN  >=8 RESISTANT Resistant     RIFAMPIN <=0.5 SENSITIVE Sensitive     Inducible Clindamycin  NEGATIVE Sensitive     LINEZOLID  2 SENSITIVE Sensitive     * METHICILLIN RESISTANT STAPHYLOCOCCUS AUREUS  Blood Culture ID Panel (Reflexed)     Status: Abnormal   Collection Time: 08/18/24  9:35 AM  Result Value Ref Range Status   Enterococcus faecalis NOT DETECTED NOT DETECTED Final   Enterococcus Faecium NOT DETECTED NOT DETECTED Final   Listeria monocytogenes NOT DETECTED NOT DETECTED Final   Staphylococcus species DETECTED (A) NOT DETECTED Final    Comment: CRITICAL RESULT CALLED TO, READ BACK BY AND VERIFIED WITH: PHARMD STEVEN HURTH ON 08/19/24 @ 1413 BY DRT    Staphylococcus aureus (BCID) DETECTED (A) NOT DETECTED Final    Comment: Methicillin (oxacillin)-resistant Staphylococcus aureus (MRSA). MRSA is predictably resistant to beta-lactam antibiotics (except ceftaroline). Preferred therapy is vancomycin  unless clinically contraindicated. Patient requires contact precautions if  hospitalized. CRITICAL RESULT CALLED TO, READ BACK BY AND VERIFIED WITH: PHARMD STEVEN HURTH ON 08/19/24 @ 1413 BY DRT    Staphylococcus epidermidis NOT DETECTED NOT DETECTED Final   Staphylococcus lugdunensis NOT DETECTED NOT DETECTED Final   Streptococcus species NOT DETECTED NOT DETECTED Final  Streptococcus agalactiae  NOT DETECTED NOT DETECTED Final   Streptococcus pneumoniae NOT DETECTED NOT DETECTED Final   Streptococcus pyogenes NOT DETECTED NOT DETECTED Final   A.calcoaceticus-baumannii NOT DETECTED NOT DETECTED Final   Bacteroides fragilis NOT DETECTED NOT DETECTED Final   Enterobacterales NOT DETECTED NOT DETECTED Final   Enterobacter cloacae complex NOT DETECTED NOT DETECTED Final   Escherichia coli NOT DETECTED NOT DETECTED Final   Klebsiella aerogenes NOT DETECTED NOT DETECTED Final   Klebsiella oxytoca NOT DETECTED NOT DETECTED Final   Klebsiella pneumoniae NOT DETECTED NOT DETECTED Final   Proteus species NOT DETECTED NOT DETECTED Final   Salmonella species NOT DETECTED NOT DETECTED Final   Serratia marcescens NOT DETECTED NOT DETECTED Final   Haemophilus influenzae NOT DETECTED NOT DETECTED Final   Neisseria meningitidis NOT DETECTED NOT DETECTED Final   Pseudomonas aeruginosa NOT DETECTED NOT DETECTED Final   Stenotrophomonas maltophilia NOT DETECTED NOT DETECTED Final   Candida albicans NOT DETECTED NOT DETECTED Final   Candida auris NOT DETECTED NOT DETECTED Final   Candida glabrata NOT DETECTED NOT DETECTED Final   Candida krusei NOT DETECTED NOT DETECTED Final   Candida parapsilosis NOT DETECTED NOT DETECTED Final   Candida tropicalis NOT DETECTED NOT DETECTED Final   Cryptococcus neoformans/gattii NOT DETECTED NOT DETECTED Final   Meth resistant mecA/C and MREJ DETECTED (A) NOT DETECTED Final    Comment: CRITICAL RESULT CALLED TO, READ BACK BY AND VERIFIED WITH: PHARMD STEVEN HURTH ON 08/19/24 @ 1413 BY DRT Performed at [facility], [address], [place], [place] [address]   Culture, blood (Routine X 2) w Reflex to ID Panel     Status: None (Preliminary result)   Collection Time: 08/19/24 11:43 AM   Specimen: BLOOD  Result Value Ref Range Status   Specimen Description BLOOD BLOOD RIGHT ARM  Final   Special Requests   Final    BOTTLES DRAWN AEROBIC AND ANAEROBIC Blood  Culture results may not be optimal due to an inadequate volume of blood received in culture bottles   Culture   Final    NO GROWTH 4 DAYS Performed at [facility], [address] [place], [place] [address]    Report Status PENDING  Incomplete  Culture, blood (Routine X 2) w Reflex to ID Panel     Status: None (Preliminary result)   Collection Time: 08/19/24 11:43 AM   Specimen: BLOOD  Result Value Ref Range Status   Specimen Description BLOOD BLOOD RIGHT HAND  Final   Special Requests   Final    Blood Culture results may not be optimal due to an inadequate volume of blood received in culture bottles BOTTLES DRAWN AEROBIC AND ANAEROBIC   Culture   Final    NO GROWTH 4 DAYS Performed at [facility], [address], [place], [place] [address]    Report Status PENDING  Incomplete    Labs: CBC: Recent Labs  Lab 08/17/24 1058 08/18/24 0930 08/19/24 0415 08/20/24 0505 08/21/24 0435 08/22/24 0423 08/23/24 0506  WBC 8.0 18.0* 11.5* 7.2 6.6 7.5 7.0  NEUTROABS 4.9 16.0*  --   --   --   --   --   HGB 14.9 14.3 13.9 13.3 13.8 13.9 13.7  HCT 45.1 43.4 43.1 39.6 41.7 42.7 42.3  MCV 88.6 91.0 91.3 89.8 89.3 89.7 89.8  PLT 152 142* 129* 125* 153 157 161   Basic Metabolic Panel: Recent Labs  Lab 08/18/24 0930 08/19/24 0415 08/20/24 0505 08/21/24 0435 08/22/24  0423 08/23/24 0506  NA 140 140 141 142 140 141  K 3.5 3.6 3.3* 3.2* 3.4* 4.2  CL 100 105 108 108 107 108  CO2 26 26 25 26 25 27   GLUCOSE 121* 100* 107* 105* 102* 108*  BUN 18 14 14 15 17 16   CREATININE 1.59* 1.39* 1.35* 1.26* 1.31* 1.23  CALCIUM  9.2 8.6* 8.5* 8.5* 8.5* 9.0  MG 1.8  --   --   --   --   --   PHOS 3.3  --   --   --   --   --    Liver Function Tests: Recent Labs  Lab 08/17/24 1058 08/18/24 0930  AST 30 29  ALT 23 22  ALKPHOS 52 58  BILITOT 0.9 1.0  PROT 6.8 6.9  ALBUMIN 4.0 3.9   CBG: Recent Labs  Lab 08/19/24 0812 08/20/24 0739 08/21/24 0800 08/23/24 0730  GLUCAP 104* 100* 110* 88     Discharge time spent: greater than 30 minutes.  Signed: [name], MD [facility] 08/23/2024

## 2024-08-23 NOTE — Telephone Encounter (Signed)
 Pharmacy Patient Advocate Encounter  Insurance verification completed.    The patient is insured through [employer]. Patient has Medicare and is not eligible for a copay card, but may be able to apply for patient assistance or Medicare RX Payment Plan (Patient Must reach out to their plan, if eligible for payment plan), if available.    Ran test claim for Eliquis and the current 30 day co-pay is $125.09.   This test claim was processed through [facility]- copay amounts may vary at other pharmacies due to pharmacy/plan contracts, or as the patient moves through the different stages of their insurance plan.

## 2024-08-23 NOTE — Progress Notes (Signed)
 PROGRESS NOTE    Patient: [name]                            PCP: Marvine Rush, MD                    DOB: 1951-04-25            DOA: 08/18/2024 FMW:[id number]             DOS: 08/23/2024, 10:34 AM   LOS: 5 days   Date of Service: The patient was seen and examined on 08/23/2024  Subjective:   The patient was seen and examined this morning, stable no acute distress No issues overnight N.p.o. In anticipation of TEE this morning  Current plan of TEE for this Monday was explained-expressed understanding    Brief Narrative:   [name] is a 73 year old male with extensive history of rheumatoid arthritis osteoarthritis, chronic DVT on Coumadin , diastolic CHF, COPD, gout, GERD..  Presented to ED with chief complaint of right leg pain and fever.  Reported has been going on for past 2 weeks.  Chronic right lower extremity edema.  Ultrasound 2 weeks ago was negative for DVT.  Otherwise is progressively becoming more erythematous with edema. Tmax at home 100.6.  No other complaints.   ED evaluation: Blood pressure (!) 153/83, pulse 78, temperature 97.9 F (36.6 C), temperature source Oral, resp. rate 17, SpO2 96%.  Abnormal labs: WBC 18, platelets 142, creatinine 1.59, INR 3.2, Cr 2.3>> 1.6  Lower extremity Doppler study: Right soft tissue swelling, cellulitis, left: Partially occlusive left popliteal vein thrombus with lack of complete compressibilit   Patient was given broad-spectrum antibiotics Rocephin , IV fluids requested to be admitted for cellulitis    Assessment & Plan:   Principal Problem:   Sepsis (HCC) Active Problems:   Chronic anticoagulation   Personal history of DVT (deep vein thrombosis)   Weakness   GERD   Rheumatoid arthritis (HCC)   Malignant neoplasm of prostate (HCC)   DVT (deep venous thrombosis) (HCC)   COPD with acute exacerbation (HCC)   History of COPD   History of seizure disorder   Primary osteoarthritis of both feet   Dyspnea  on exertion   OSA (obstructive sleep apnea)   Class 2 severe obesity due to excess calories with serious comorbidity and body mass index (BMI) of 35.0 to 35.9 in adult (HCC)   Chronic diastolic HF (heart failure) (HCC)   Stage 3a chronic kidney disease (CKD) (HCC)   MRSA bacteremia   MRSA infection     Assessment and Plan:  * Sepsis (HCC)/bacteremia MRSA bacteremia Source of infection right lower extremity wound/cellulitis  - Resolved sepsis physiology, hemodynamically stable  - Blood culture from 8/20  positive for Staph aureus (MRSA) - 8/21 Echocardiogram-poor visualization otherwise negative no signs of vegetation or endocarditis - 08/19/2024 consult infectious disease team-IV ABX Zyvox  switched to daptomycin  - Repeating blood cultures--no growth to date  - Per ID recommendation, discussed with cardiology anticipating TEE  today 08/23/2024  POA: Right lower extremity cellulitis -with open wound, negative for DVT POA: Patient meets sepsis due to cellulitis-  w leukocytosis WBC 18, lactic acid 2.3, creatinine 1.59, RR 21 -Immunocompromised due to rheumatoid arthritis-medication -S/p IV fluid resuscitation, IV antibiotics   Hypokalemia -monitoring repleted   chronic anticoagulation - Chronically anticoagulated for chronic DVT on Coumadin   - INR therapeutic at 3.2 >> 3.5, 1.8 - Discussed with  pharmacy and patient regarding switching from Coumadin  to possibly Eliquis given affordability  Weakness - Consulting PT OT for evaluation recommendations  Acute on chronic stage 3a chronic kidney disease (CKD) (HCC) - Continue to improve - Monitoring closely, avoiding nephrotoxins, avoiding hypotension -Mildly elevated BUN/creatinine from baseline  Lab Results  Component Value Date   CREATININE 1.23 08/23/2024   CREATININE 1.31 (H) 08/22/2024   CREATININE 1.26 (H) 08/21/2024    estimated creatinine clearance is 48.2 mL/min (A) (by C-G formula based on SCr of 1.59 mg/dL  (H)).   Chronic diastolic HF (heart failure) (HCC) -Hemodynamically stable - Will monitor I's and O's, daily weights -Unfortunately patient needs fluid resuscitation due to meeting sepsis criteria -Will monitor closely for volume overload, resuming his home medication of 80 mg Lasix  in a.m. -Last echo March 2023 EGF 55-60% normal LV function normal RV function valves within normal limits, no LVH - Resuming oral Lasix  -reducing and holding today's dose -euvolemic, elevated creatinine  Class 2 severe obesity due to excess calories with serious comorbidity and body mass index (BMI) of 35.0 to 35.9 in adult (HCC) Height 5'9, weight 99.8 kg, BMI 32.49 kg/m -Discussed with patient, advised follow-up with PCP regarding outpatient weight loss programs  OSA (obstructive sleep apnea) Monitor closely, nightly CPAP  Dyspnea on exertion -continue inhalers as needed nebs   Primary osteoarthritis of both feet - Continue current meds, With holding immunosuppressants: Including Tocilizumab    History of seizure disorder -stable continue phenytoin    History of COPD-stable no signs of exacerbation -as needed DuoNeb  DVT (deep venous thrombosis) (HCC) - Chronically on Coumadin , bilateral lower extremity Doppler studies still revealing left lower extremity DVT, right cellulitic changes -INR therapeutic at 3.2  Malignant neoplasm of prostate [facility]) - Follow-up with urologist as outpatient  Rheumatoid arthritis (HCC) Holding Tocilizumab    GERD Continue PPI     ---------------------------------------------------------------------------------------------------------------------------------------------- Nutritional status:  The patient's BMI is: Body mass index is 32.88 kg/m. I agree with the assessment and plan as outlined   ------------------------------------------------------------------------------------------------------------------- Cultures; 08/18/24  Blood Cultures x 2 >>  MRSA 08/19/24 repeat blood cultures--no growth to date   8/20 - 8/21 - Zyvox  8/21 - IV Cubicin  -----------------------------------------------------------------------------------------------------------  DVT prophylaxis:  SCDs Start: 08/18/24 1210 warfarin (COUMADIN ) tablet 3 mg   Code Status:   Code Status: Full Code  Family Communication: Wife updated at bedside -Advance care planning has been discussed.   Admission status:   Status is: Inpatient Remains inpatient appropriate because: Needing IV antibiotics,   Disposition: From  - home             Planning for discharge in 1-2 days   Procedures:   No admission procedures for hospital encounter.   Antimicrobials:  Anti-infectives (From admission, onward)    Start     Dose/Rate Route Frequency Ordered Stop   08/20/24 1600  vancomycin  (VANCOREADY) IVPB 1250 mg/250 mL  Status:  Discontinued        1,250 mg 166.7 mL/hr over 90 Minutes Intravenous Every 24 hours 08/19/24 1442 08/19/24 1554   08/19/24 1645  DAPTOmycin  (CUBICIN ) IVPB 700 mg/100mL premix        8 mg/kg  84 kg (Adjusted) 200 mL/hr over 30 Minutes Intravenous Daily 08/19/24 1554     08/19/24 1530  vancomycin  (VANCOREADY) IVPB 2000 mg/400 mL  Status:  Discontinued        2,000 mg 200 mL/hr over 120 Minutes Intravenous  Once 08/19/24 1441 08/19/24 1554   08/19/24 0000  clindamycin  (CLEOCIN )  300 MG capsule        300 mg Oral 3 times daily 08/19/24 1048 08/25/24 2359   08/18/24 1400  linezolid  (ZYVOX ) IVPB 600 mg  Status:  Discontinued        600 mg 300 mL/hr over 60 Minutes Intravenous Every 12 hours 08/18/24 1215 08/19/24 1441   08/18/24 1015  cefTRIAXone  (ROCEPHIN ) 2 g in sodium chloride  0.9 % 100 mL IVPB        2 g 200 mL/hr over 30 Minutes Intravenous Once 08/18/24 1012 08/18/24 1110        Medication:   allopurinol   100 mg Oral BID   amLODipine   10 mg Oral Daily   budesonide -glycopyrrolate -formoterol   2 puff Inhalation BID   diclofenac  Sodium  2 g  Topical QID   furosemide   40 mg Oral Daily   labetalol   100 mg Oral BID   lactobacillus  1 g Oral TID WC   leflunomide   20 mg Oral Daily   mupirocin  ointment   Topical TID   oxybutynin   10 mg Oral Daily   pantoprazole   40 mg Oral Daily   phenytoin   100 mg Oral Daily   And   phenytoin   200 mg Oral QHS   [START ON 08/24/2024] rosuvastatin   10 mg Oral Daily   sodium chloride  flush  3 mL Intravenous Q12H   sodium chloride  flush  3 mL Intravenous Q12H   spironolactone   50 mg Oral Daily   warfarin  3 mg Oral ONCE-1600   Warfarin - Pharmacist Dosing Inpatient   Does not apply q1600    acetaminophen  **OR** acetaminophen , bisacodyl , hydrALAZINE , HYDROmorphone  (DILAUDID ) injection, ipratropium, levalbuterol , ondansetron  **OR** ondansetron  (ZOFRAN ) IV, oxyCODONE , senna-docusate, sodium phosphate , traZODone    Objective:   Vitals:   08/22/24 2135 08/23/24 0547 08/23/24 0810 08/23/24 0815  BP: (!) 160/79 (!) 183/85  (!) 146/84  Pulse: 73 75  67  Resp: 20 20  19   Temp: 98.1 F (36.7 C) 97.9 F (36.6 C)  98.1 F (36.7 C)  TempSrc: Oral Oral  Oral  SpO2: 95% 97% 97% 95%  Weight:  101 kg      Intake/Output Summary (Last 24 hours) at 08/23/2024 1034 Last data filed at 08/22/2024 1800 Gross per 24 hour  Intake 580 ml  Output --  Net 580 ml   Filed Weights   08/21/24 0601 08/22/24 0545 08/23/24 0547  Weight: 100.5 kg 100.3 kg 101 kg     Physical examination:  Blood pressure (!) 146/84, pulse 67, temperature 98.1 F (36.7 C), temperature source Oral, resp. rate 19, weight 101 kg, SpO2 95%.    General:  AAO x 3,  cooperative, no distress;   HEENT:  Normocephalic, PERRL, otherwise with in Normal limits   Neuro:  CNII-XII intact. , normal motor and sensation, reflexes intact   Lungs:   Clear to auscultation BL, Respirations unlabored,  No wheezes / crackles  Cardio:    S1/S2, RRR, No murmure, No Rubs or Gallops   Abdomen:  Soft, non-tender, bowel sounds active all four  quadrants, no guarding or peritoneal signs.  Muscular  skeletal:  Limited exam -global generalized weaknesses - in bed, able to move all 4 extremities,   2+ pulses,  symmetric, right lower extremity edema +2, improving  Skin:  Dry, warm to touch, right lower extremity wound, dressing in place  Wounds: Right lateral lower extremity wound -please see nurses documentation       ------------------------------------------------------------------------------------------------------------------------------------------    LABs:  Latest Ref Rng & Units 08/23/2024    5:06 AM 08/22/2024    4:23 AM 08/21/2024    4:35 AM  CBC  WBC 4.0 - 10.5 K/uL 7.0  7.5  6.6   Hemoglobin 13.0 - 17.0 g/dL 86.2  86.0  86.1   Hematocrit 39.0 - 52.0 % 42.3  42.7  41.7   Platelets 150 - 400 K/uL 161  157  153       Latest Ref Rng & Units 08/23/2024    5:06 AM 08/22/2024    4:23 AM 08/21/2024    4:35 AM  CMP  Glucose 70 - 99 mg/dL 891  897  894   BUN 8 - 23 mg/dL 16  17  15    Creatinine 0.61 - 1.24 mg/dL 8.76  8.68  8.73   Sodium 135 - 145 mmol/L 141  140  142   Potassium 3.5 - 5.1 mmol/L 4.2  3.4  3.2   Chloride 98 - 111 mmol/L 108  107  108   CO2 22 - 32 mmol/L 27  25  26    Calcium  8.9 - 10.3 mg/dL 9.0  8.5  8.5        Micro Results Recent Results (from the past 240 hours)  Blood Culture (routine x 2)     Status: None   Collection Time: 08/18/24  9:30 AM   Specimen: BLOOD  Result Value Ref Range Status   Specimen Description BLOOD BLOOD RIGHT ARM  Final   Special Requests   Final    Blood Culture adequate volume BOTTLES DRAWN AEROBIC AND ANAEROBIC   Culture   Final    NO GROWTH 5 DAYS Performed at [facility], [address], [place], [place] [address]    Report Status 08/23/2024 FINAL  Final  Blood Culture (routine x 2)     Status: Abnormal   Collection Time: 08/18/24  9:35 AM   Specimen: BLOOD  Result Value Ref Range Status   Specimen Description   Final    BLOOD RIGHT  ANTECUBITAL Performed at [facility], [address] [place], [place] [address]    Special Requests   Final    BOTTLES DRAWN AEROBIC AND ANAEROBIC Blood Culture results may not be optimal due to an inadequate volume of blood received in culture bottles Performed at [facility], [address] [place], [place] [address]    Culture  Setup Time   Final    ANAEROBIC BOTTLE ONLY GRAM POSITIVE COCCI Gram Stain Report Called to,Read Back By and Verified With: D. MISS RN 08/19/24 @0726  BY J. WHITE GRAM STAIN REVIEWED-AGREE WITH RESULT DRT CRITICAL RESULT CALLED TO, READ BACK BY AND VERIFIED WITH: PHARMD STEVEN HURTH ON 08/19/24 @ 1413 BY DRT Performed at [facility], [address], [place], [place] [address]    Culture METHICILLIN RESISTANT STAPHYLOCOCCUS AUREUS (A)  Final   Report Status 08/21/2024 FINAL  Final   Organism ID, Bacteria METHICILLIN RESISTANT STAPHYLOCOCCUS AUREUS  Final      Susceptibility   Methicillin resistant staphylococcus aureus - MIC*    CIPROFLOXACIN  >=8 RESISTANT Resistant     ERYTHROMYCIN >=8 RESISTANT Resistant     GENTAMICIN  <=0.5 SENSITIVE Sensitive     OXACILLIN >=4 RESISTANT Resistant     TETRACYCLINE <=1 SENSITIVE Sensitive     VANCOMYCIN  <=0.5 SENSITIVE Sensitive     TRIMETH/SULFA <=10 SENSITIVE Sensitive     CLINDAMYCIN  >=8 RESISTANT Resistant     RIFAMPIN <=0.5 SENSITIVE Sensitive  Inducible Clindamycin  NEGATIVE Sensitive     LINEZOLID  2 SENSITIVE Sensitive     * METHICILLIN RESISTANT STAPHYLOCOCCUS AUREUS  Blood Culture ID Panel (Reflexed)     Status: Abnormal   Collection Time: 08/18/24  9:35 AM  Result Value Ref Range Status   Enterococcus faecalis NOT DETECTED NOT DETECTED Final   Enterococcus Faecium NOT DETECTED NOT DETECTED Final   Listeria monocytogenes NOT DETECTED NOT DETECTED Final   Staphylococcus species DETECTED (A) NOT DETECTED Final    Comment: CRITICAL RESULT CALLED TO, READ BACK BY AND VERIFIED WITH: PHARMD STEVEN HURTH  ON 08/19/24 @ 1413 BY DRT    Staphylococcus aureus (BCID) DETECTED (A) NOT DETECTED Final    Comment: Methicillin (oxacillin)-resistant Staphylococcus aureus (MRSA). MRSA is predictably resistant to beta-lactam antibiotics (except ceftaroline). Preferred therapy is vancomycin  unless clinically contraindicated. Patient requires contact precautions if  hospitalized. CRITICAL RESULT CALLED TO, READ BACK BY AND VERIFIED WITH: PHARMD STEVEN HURTH ON 08/19/24 @ 1413 BY DRT    Staphylococcus epidermidis NOT DETECTED NOT DETECTED Final   Staphylococcus lugdunensis NOT DETECTED NOT DETECTED Final   Streptococcus species NOT DETECTED NOT DETECTED Final   Streptococcus agalactiae NOT DETECTED NOT DETECTED Final   Streptococcus pneumoniae NOT DETECTED NOT DETECTED Final   Streptococcus pyogenes NOT DETECTED NOT DETECTED Final   A.calcoaceticus-baumannii NOT DETECTED NOT DETECTED Final   Bacteroides fragilis NOT DETECTED NOT DETECTED Final   Enterobacterales NOT DETECTED NOT DETECTED Final   Enterobacter cloacae complex NOT DETECTED NOT DETECTED Final   Escherichia coli NOT DETECTED NOT DETECTED Final   Klebsiella aerogenes NOT DETECTED NOT DETECTED Final   Klebsiella oxytoca NOT DETECTED NOT DETECTED Final   Klebsiella pneumoniae NOT DETECTED NOT DETECTED Final   Proteus species NOT DETECTED NOT DETECTED Final   Salmonella species NOT DETECTED NOT DETECTED Final   Serratia marcescens NOT DETECTED NOT DETECTED Final   Haemophilus influenzae NOT DETECTED NOT DETECTED Final   Neisseria meningitidis NOT DETECTED NOT DETECTED Final   Pseudomonas aeruginosa NOT DETECTED NOT DETECTED Final   Stenotrophomonas maltophilia NOT DETECTED NOT DETECTED Final   Candida albicans NOT DETECTED NOT DETECTED Final   Candida auris NOT DETECTED NOT DETECTED Final   Candida glabrata NOT DETECTED NOT DETECTED Final   Candida krusei NOT DETECTED NOT DETECTED Final   Candida parapsilosis NOT DETECTED NOT DETECTED Final    Candida tropicalis NOT DETECTED NOT DETECTED Final   Cryptococcus neoformans/gattii NOT DETECTED NOT DETECTED Final   Meth resistant mecA/C and MREJ DETECTED (A) NOT DETECTED Final    Comment: CRITICAL RESULT CALLED TO, READ BACK BY AND VERIFIED WITH: PHARMD STEVEN HURTH ON 08/19/24 @ 1413 BY DRT Performed at [facility], [address], [place], [place] [address]   Culture, blood (Routine X 2) w Reflex to ID Panel     Status: None (Preliminary result)   Collection Time: 08/19/24 11:43 AM   Specimen: BLOOD  Result Value Ref Range Status   Specimen Description BLOOD BLOOD RIGHT ARM  Final   Special Requests   Final    BOTTLES DRAWN AEROBIC AND ANAEROBIC Blood Culture results may not be optimal due to an inadequate volume of blood received in culture bottles   Culture   Final    NO GROWTH 4 DAYS Performed at [facility], [address], [place], [place] [address]    Report Status PENDING  Incomplete  Culture, blood (Routine X 2) w Reflex to ID Panel     Status: None (Preliminary  result)   Collection Time: 08/19/24 11:43 AM   Specimen: BLOOD  Result Value Ref Range Status   Specimen Description BLOOD BLOOD RIGHT HAND  Final   Special Requests   Final    Blood Culture results may not be optimal due to an inadequate volume of blood received in culture bottles BOTTLES DRAWN AEROBIC AND ANAEROBIC   Culture   Final    NO GROWTH 4 DAYS Performed at [facility], [address], [place], [place] [address]    Report Status PENDING  Incomplete    Radiology Reports No results found.   SIGNED: [name], MD, FHM. FAAFP. Jolynn Pack - [facility] Time spent - 55 min.  In seeing, evaluating and examining the patient. Reviewing medical records, labs, drawn plan of care. [facility],  Pager (please use [email or web address] to page/ text) Please use Epic Secure Chat for non-urgent communication (7AM-7PM)  If 7PM-7AM, please contact night-coverage [email or web address], 08/23/2024,  10:34 AM

## 2024-08-23 NOTE — CV Procedure (Signed)
 CV Procedure Note  Procedure: Transesophageal echocardiogram Indication: Bacteremia Physician: Dr Dorn Ross MD   Patient brought to the procedure suite after appropriate consent was obtained. The posterior oropharynx was anesthesized with cetacaine  spray and bite block placed. Patient placed in the left lateral decubitus position. Sedation achieved with the assistance of anesthesiology, for details please refer to their documentation. TEE probe intubated into esophagus without difficultly and several views obtained. Please see complete TEE report for full findings. In summary no evidence of endocarditis. TEE probe was removed. Cardiopulmonary monitoring was performed throughout the procedure, he tolerated well without complications   Dorn Ross MD

## 2024-08-23 NOTE — H&P (Signed)
 Procedure H&P   Plans for transesophageal echocardiogram in the setting of MRSA bacteremia. Procedure will be performed with the assistance of anesthesiology. For full history please refer to referenced hospital note below  Dorn Ross MD    History and Physical    Patient: [name]                                                               PCP: Marvine Rush, MD                    DOB: 07-13-1951                                                                    DOA: 08/18/2024 FMW:[id number]                                                                    DOS: 08/18/2024, 12:37 PM   Marvine Rush, MD   Patient coming from:   HOME  I have personally reviewed patient's medical records, in electronic medical records, including:  [place] link, and care everywhere.      Chief Complaint:       Chief Complaint  Patient presents with   Leg Pain      History of present illness:      [name] is a 73 year old male with extensive history of rheumatoid arthritis osteoarthritis, chronic DVT on Coumadin , diastolic CHF, COPD, gout, GERD..  Presented to ED with chief complaint of right leg pain and fever.  Reported has been going on for past 2 weeks.  Chronic right lower extremity edema.  Ultrasound 2 weeks ago was negative for DVT.  Otherwise is progressively becoming more erythematous with edema. Tmax at home 100.6.  No other complaints.     ED evaluation: Blood pressure (!) 153/83, pulse 78, temperature 97.9 F (36.6 C), temperature source Oral, resp. rate 17, SpO2 96%.   Abnormal labs: WBC 18, platelets 142, creatinine 1.59, INR 3.2, Cr 2.3>> 1.6   Lower extremity Doppler study: Right soft tissue swelling, cellulitis, left: Partially occlusive left popliteal vein thrombus with lack of complete compressibilit     Patient was given broad-spectrum antibiotics Rocephin , IV fluids requested to be admitted for cellulitis      Patient Denies having: Fever,  Chills, Cough, SOB, Chest Pain, Abd pain, N/V/D, headache, dizziness, lightheadedness,  Dysuria, Joint pain, rash, open wounds   ED Course:   Blood pressure (!) 153/83, pulse 78, temperature 97.9 F (36.6 C), temperature source Oral, resp. rate 17, SpO2 96%. Abnormal labs;     Review of Systems: As per HPI, otherwise 10 point review of systems were negative.    ----------------------------------------------------------------------------------------------------------------------   Allergies       Allergies  Allergen Reactions   Orencia  [  Abatacept ] Anaphylaxis      Had prostate cancer   Carbamazepine Rash      REACTION: makes drowsy BRAND NAME IS TEGRETOL   Celecoxib Itching and Rash      BRAND NAME IS CELEBREX   Cephalexin Rash      Severe Rash.  BRAND NAME IS KEFLEX.  Patient has tolerated ceftriaxone  05/2019   Enbrel [Etanercept] Rash   Humira [Adalimumab] Rash   Levofloxacin  Other (See Comments)      REACTION: GI Intolerance BRAND NAME IS LEVAQUIN    Sulfa Antibiotics Rash   Sulfasalazine Rash        Home MEDs:         Prior to Admission medications   Medication Sig Start Date End Date Taking? Authorizing Provider  allopurinol  (ZYLOPRIM ) 100 MG tablet Take 1 tablet (100 mg total) by mouth 2 (two) times daily. 02/12/23     Cheryl Waddell HERO, PA-C  ascorbic acid (VITAMIN C) 500 MG tablet Take 500 mg by mouth daily.       [provider]  Budeson-Glycopyrrol-Formoterol  (BREZTRI  AEROSPHERE) 160-9-4.8 MCG/ACT AERO Inhale 2 puffs into the lungs in the morning. 02/06/24     Geronimo Amel, MD  cholecalciferol (VITAMIN D3) 25 MCG (1000 UNIT) tablet Take 1,000 Units by mouth daily.       [provider]  diclofenac  Sodium (VOLTAREN ) 1 % GEL Apply 2-4 grams to affected joint 4 times daily as needed. Patient taking differently: Apply 2-4 grams to affected joint 4 times daily as needed for pain 03/02/20     Cheryl Waddell HERO, PA-C  dicyclomine  (BENTYL ) 10 MG capsule TAKE  1 CAPSULE BY MOUTH 4 TIMES DAILY BEFORE MEALS AND AT BEDTIME Patient taking differently: Take 20 mg by mouth 2 (two) times daily. 08/11/17     Aneita Gwendlyn DASEN, MD  fluticasone  furoate-vilanterol (BREO ELLIPTA ) 100-25 MCG/ACT AEPB USE 1 INHALATION ORALLY EVERY DAY Patient not taking: Reported on 04/01/2024 02/06/24     Geronimo Amel, MD  furosemide  (LASIX ) 80 MG tablet TAKE 1 TABLET BY MOUTH EVERY DAY 08/19/22     Croitoru, Mihai, MD  guaiFENesin -codeine  100-10 MG/5ML syrup Take 10 mLs by mouth every 6 (six) hours as needed for cough. Patient not taking: Reported on 04/01/2024 12/27/23     Geroldine Berg, MD  leflunomide  (ARAVA ) 20 MG tablet TAKE 1 TABLET BY MOUTH EVERY DAY 03/15/24     Cheryl Waddell HERO, PA-C  levocetirizine (XYZAL) 5 MG tablet Take 5 mg by mouth every evening.       [provider]  mupirocin  ointment (BACTROBAN ) 2 %   06/02/23     [provider]  omeprazole  (PRILOSEC) 40 MG capsule Take 1 capsule (40 mg total) by mouth daily. 01/25/23     Pearlean Manus, MD  oxybutynin  (DITROPAN -XL) 10 MG 24 hr tablet Take 1 tablet (10 mg total) by mouth daily. 03/15/22     McKenzie, Belvie CROME, MD  phenytoin  (DILANTIN ) 100 MG ER capsule Take 100-200 mg by mouth See admin instructions. Take one capsule in the morning and 2 capsules at bedtime       [provider]  Potassium Chloride  ER 20 MEQ TBCR Take 20 mEq by mouth daily. 02/24/19     [provider]  rosuvastatin  (CRESTOR ) 10 MG tablet Take 10 mg by mouth daily. 06/14/20     [provider]  spironolactone  (ALDACTONE ) 50 MG tablet TAKE 1 TABLET BY MOUTH EVERY DAY 05/03/24     Croitoru, Olga,  MD  Tocilizumab  (ACTEMRA  IV) Inject into the vein every 28 (twenty-eight) days.       [provider]  warfarin (COUMADIN ) 3 MG tablet Take 1 tablet (3 mg total) by mouth at bedtime. Restart on 3/24 (48 hours after surgery) 03/22/21     Gaston Hamilton, MD      PRN MEDs:  acetaminophen  **OR** acetaminophen ,  bisacodyl , guaiFENesin -codeine , hydrALAZINE , HYDROmorphone  (DILAUDID ) injection, ipratropium, levalbuterol , ondansetron  **OR** ondansetron  (ZOFRAN ) IV, oxyCODONE , senna-docusate, sodium phosphate , traZODone            Past Medical History:  Diagnosis Date   Arthritis      RA   Asthma     CHF (congestive heart failure) (HCC)      pt denies    Clotting disorder (HCC)      DVT both legs    COPD (chronic obstructive pulmonary disease) (HCC)     DDD (degenerative disc disease), cervical      with UE's paresthesias   Diverticulosis     DVT, lower extremity (HCC)      bilat   Eye abnormality      right eye drifts has difficulty focusing with 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 Unknown origin.   Shortness of breath dyspnea      exertion    Sleep apnea      cpap    Tubular adenoma of colon 04/2008               Past Surgical History:  Procedure Laterality Date   CHOLECYSTECTOMY       CIRCUMCISION       COLONOSCOPY       CYSTOSCOPY WITH LITHOLAPAXY N/A 03/17/2019    Procedure: CYSTOSCOPY WITH LITHOLAPAXY AND REMOVAL OF FOREIGN BODY;  Surgeon: Sherrilee Belvie CROME, MD;  Location: [facility];  Service: Urology;  Laterality: N/A;   DOPPLER ECHOCARDIOGRAPHY N/A 01-21-2012    TECHNICALLY DIFFICULT. MILD CONCENTRIC LV HYPERTROPHY. LV CAVITY IS SMALL.. EF=> 55%. TRANSMITRAL SPECTRAL FLOW PATTREN IS SUGGESTIVE OF IMPAIRED LV RELAXATION. RV SYSTOLIC PRESSURE IS . LEFT ATRIAL SIZE IS NORMAL. AV APPEARS MILDLY SCLEROTIC. NO SIGN VALVE DISEASE NOTED.   LEFT HEART CATH AND CORONARY ANGIOGRAPHY N/A 03/12/2022    Procedure: LEFT HEART CATH  AND CORONARY ANGIOGRAPHY;  Surgeon: Wonda Sharper, MD;  Location: [facility] INVASIVE CV LAB;  Service: Cardiovascular;  Laterality: N/A;   LYMPHADENECTOMY Bilateral 08/30/2015    Procedure: PELVIC LYMPHADENECTOMY;  Surgeon: Belvie CROME Sherrilee, MD;  Location: [facility];  Service: Urology;  Laterality: Bilateral;   NUCLEAR STRESS TEST N/A 01-21-2012    NORMAL PATTERN OF PERFUSION IN ALL REGIONS. POST STRESS LV SIZE IS NORMAL. NO EVIDENCE OF INDUCIBLE ISCHEMIA. EF 54%.   POLYPECTOMY       PROSTATE BIOPSY       ROBOT ASSISTED LAPAROSCOPIC RADICAL PROSTATECTOMY N/A 08/30/2015    Procedure: ROBOTIC ASSISTED LAPAROSCOPIC RADICAL PROSTATECTOMY;  Surgeon: Belvie CROME Sherrilee, MD;  Location: [facility];  Service: Urology;  Laterality: N/A;   URINARY SPHINCTER IMPLANT N/A 03/20/2021    Procedure: ARTIFICIAL URINARY SPHINCTER CYSTOSCOPY;  Surgeon: Gaston Hamilton, MD;  Location: [facility];  Service: Urology;  Laterality: N/A;  REQUESTING 90 MINS   US  VENOUS LOWER EXT Right 03/07/11    PERSISTENT DVT IN RIGHT LOWER EXT.WITH PERSISTANT VISUALIZATION OF HYPOECHOIC THROMBUS WITHIN THE FEMORAL, PROFUNDA FEMORAL AND POPLITEAL VEINS. WHEN COMPARED TO PREVIOUS, CLOT IS NO LONGER IDENTIFIED WITH IN THE RIGHT CFV.           reports that he quit smoking about 27 years ago. His smoking use included cigarettes. He started smoking about 47 years ago. He has a 60 pack-year smoking history. He has never been exposed to tobacco smoke. He has never used smokeless tobacco. He reports that he does not drink alcohol and does not use drugs.          Family History  Problem Relation Age of Onset   Alzheimer's disease Mother     Skin cancer Father     Stomach cancer Father     Heart attack Father     Breast cancer Sister     Heart attack Sister     Stroke Sister     Bladder Cancer Sister     Heart murmur Sister     Esophageal cancer Brother     Throat cancer Brother     Stomach cancer Brother     Lung cancer Brother     Colon cancer  Brother     Heart attack Brother     Heart attack Brother     Colon cancer Brother     Colon polyps Neg Hx     Rectal cancer Neg Hx            Physical Exam:          Vitals:    08/18/24 1100 08/18/24 1130 08/18/24 1200 08/18/24 1220  BP: 138/72 (!) 148/75 (!) 153/83    Pulse: 77 75 78    Resp: 19 (!) 21 17    Temp:       97.9 F (36.6 C)  TempSrc:       Oral  SpO2: 96% 95% 96%      Constitutional: NAD, calm, comfortable Eyes: PERRL, lids and conjunctivae normal ENMT: Mucous membranes are moist. Posterior pharynx clear of any exudate or lesions.Normal dentition.  Neck: normal, supple, no masses, no thyromegaly Respiratory: clear to auscultation bilaterally, no wheezing, no crackles. Normal respiratory effort. No accessory muscle use.  Cardiovascular: Regular rate and rhythm, no murmurs / rubs / gallops. No extremity edema. 2+ pedal pulses. No carotid bruits.  Abdomen: no tenderness, no masses palpated. No hepatosplenomegaly. Bowel sounds positive.  Musculoskeletal: no clubbing / cyanosis. No joint deformity upper and lower extremities. Good ROM, no contractures. Normal muscle tone.  Neurologic: CN II-XII grossly intact. Sensation intact, DTR normal. Strength 5/5 in all 4.  Psychiatric: Normal judgment and insight. Alert and oriented x 3. Normal mood.  Skin: no rashes, lesions, ulcers. No induration Decubitus/ulcers:  Wounds: per nursing documentation               Labs on admission:    I have personally reviewed following labs and imaging studies   CBC: Last Labs      Recent Labs  Lab 08/17/24 1058 08/18/24 0930  WBC 8.0 18.0*  NEUTROABS 4.9 16.0*  HGB 14.9 14.3  HCT 45.1 43.4  MCV 88.6 91.0  PLT 152  142*      Basic Metabolic Panel: Last Labs      Recent Labs  Lab 08/17/24 1058 08/18/24 0930  NA 140 140  K 4.1 3.5  CL 102 100  CO2 28 26  GLUCOSE 110* 121*  BUN 18 18  CREATININE 1.52* 1.59*  CALCIUM  9.3 9.2      GFR: Estimated Creatinine  Clearance: 48.2 mL/min (A) (by C-G formula based on SCr of 1.59 mg/dL (H)). Liver Function Tests: Last Labs      Recent Labs  Lab 08/17/24 1058 08/18/24 0930  AST 30 29  ALT 23 22  ALKPHOS 52 58  BILITOT 0.9 1.0  PROT 6.8 6.9  ALBUMIN 4.0 3.9      Last Labs  No results for input(s): LIPASE, AMYLASE in the last 168 hours.   Last Labs  No results for input(s): AMMONIA in the last 168 hours.   Coagulation Profile: Last Labs     Recent Labs  Lab 08/18/24 0930  INR 3.2*      Cardiac Enzymes: Last Labs  No results for input(s): CKTOTAL, CKMB, CKMBINDEX, TROPONINI in the last 168 hours.   BNP (last 3 results) Recent Labs (within last 365 days)  No results for input(s): PROBNP in the last 8760 hours.   HbA1C: Recent Labs (last 2 labs)  No results for input(s): HGBA1C in the last 72 hours.   CBG: Last Labs  No results for input(s): GLUCAP in the last 168 hours.   Lipid Profile: Recent Labs (last 2 labs)  No results for input(s): CHOL, HDL, LDLCALC, TRIG, CHOLHDL, LDLDIRECT in the last 72 hours.   Thyroid  Function Tests: Recent Labs (last 2 labs)  No results for input(s): TSH, T4TOTAL, FREET4, T3FREE, THYROIDAB in the last 72 hours.   Anemia Panel: Recent Labs (last 2 labs)  No results for input(s): VITAMINB12, FOLATE, FERRITIN, TIBC, IRON, RETICCTPCT in the last 72 hours.   Urine analysis: Labs (Brief)          Component Value Date/Time    COLORURINE YELLOW 08/18/2024 1140    APPEARANCEUR CLEAR 08/18/2024 1140    APPEARANCEUR Clear 03/21/2023 0944    LABSPEC 1.014 08/18/2024 1140    PHURINE 5.0 08/18/2024 1140    GLUCOSEU NEGATIVE 08/18/2024 1140    HGBUR NEGATIVE 08/18/2024 1140    BILIRUBINUR NEGATIVE 08/18/2024 1140    BILIRUBINUR Negative 03/21/2023 0944    KETONESUR NEGATIVE 08/18/2024 1140    PROTEINUR NEGATIVE 08/18/2024 1140    UROBILINOGEN 0.2 09/08/2020 1326    UROBILINOGEN 0.2  09/24/2012 1640    NITRITE NEGATIVE 08/18/2024 1140    LEUKOCYTESUR NEGATIVE 08/18/2024 1140        Last A1C:  Recent Labs  No results found for: HGBA1C       Radiologic Exams on Admission:     Imaging Results (Last 48 hours)  US  Venous Img Lower Bilateral Addendum Date: 08/18/2024 ADDENDUM REPORT: 08/18/2024 11:38 ADDENDUM: Critical Value/emergent results were called by telephone at the time of interpretation by Dr Duwaine Severs on August 18, 2024 to provider Dr. Freddi who verbally acknowledged these results. Electronically Signed   By: Megan  Zare M.D.   On: 08/18/2024 11:38    Result Date: 08/18/2024 CLINICAL DATA:  Chronic lower extremity edema EXAM: Bilateral LOWER EXTREMITY VENOUS DOPPLER ULTRASOUND TECHNIQUE: Gray-scale sonography with compression, as well as color and duplex ultrasound, were performed to evaluate the deep venous system(s) from the level of the common femoral vein through the popliteal and  proximal calf veins. COMPARISON:  July 26, 2024 FINDINGS: VENOUS There is a partially occlusive thrombus within the normal-sized left popliteal vein with lack of complete compressibility. Otherwise there is normal compressibility of the common femoral, superficial femoral, and popliteal veins, as well as the remainder of the visualized calf veins. Visualized portions of profunda femoral vein and great saphenous vein unremarkable. No filling defects to suggest DVT on grayscale or color Doppler imaging within the remainder of the vessels. Doppler waveforms show normal direction of venous flow, normal respiratory plasticity and response to augmentation. OTHER Bilateral lower extremity soft tissue edema distal to the knee. Limitations: none IMPRESSION: Partially occlusive left popliteal vein thrombus with lack of complete compressibility. No DVT on the right. Soft tissue edema in bilateral lower extremity distal to the knee. Electronically Signed: By: Megan  Zare M.D. On: 08/18/2024 11:32     DG Chest Port 1 View Result Date: 08/18/2024 CLINICAL DATA:  Fever. EXAM: PORTABLE CHEST 1 VIEW COMPARISON:  December 26, 2023. FINDINGS: Stable cardiomediastinal silhouette. Stable elevated left hemidiaphragm. No definite acute pulmonary disease is noted. Bony thorax is unremarkable. IMPRESSION: No active disease. Electronically Signed   By: Lynwood Landy Raddle M.D.   On: 08/18/2024 09:36    DG Tibia/Fibula Right Result Date: 08/18/2024 CLINICAL DATA:  Right lower extremity pain and swelling for several weeks. EXAM: RIGHT TIBIA AND FIBULA - 2 VIEW COMPARISON:  None Available. FINDINGS: There is no evidence of fracture or other focal bone lesions. Soft tissues are unremarkable. IMPRESSION: Negative. Electronically Signed   By: Lynwood Landy Raddle M.D.   On: 08/18/2024 09:34       EKG:    Independently reviewed.      Orders placed or performed during the hospital encounter of 08/18/24   ED EKG   ED EKG   EKG 12-Lead   EKG 12-Lead   EKG 12-Lead    ---------------------------------------------------------------------------------------------------------------------------------------       Assessment / Plan:    Principal Problem:   Sepsis (HCC) Active Problems:   Chronic anticoagulation   Personal history of DVT (deep vein thrombosis)   Weakness   GERD   Rheumatoid arthritis (HCC)   Malignant neoplasm of prostate (HCC)   DVT (deep venous thrombosis) (HCC)   COPD with acute exacerbation (HCC)   History of COPD   History of seizure disorder   Primary osteoarthritis of both feet   Dyspnea on exertion   OSA (obstructive sleep apnea)   Class 2 severe obesity due to excess calories with serious comorbidity and body mass index (BMI) of 35.0 to 35.9 in adult (HCC)   Chronic diastolic HF (heart failure) (HCC)   Stage 3a chronic kidney disease (CKD) (HCC)     Assessment and Plan: * Sepsis (HCC) - Right lower extremity cellulitis -with open wound, negative for DVT POA: Patient meets  sepsis due to cellulitis-  w leukocytosis WBC 18, lactic acid 2.3, creatinine 1.59, RR 21 -Immunocompromised -patient received Rocephin  in ED, broaden spectrum antibiotic to Zyvox  -Continue IV fluid resuscitation based on sepsis protocol -Will follow the blood cultures   Chronic anticoagulation - Chronically anticoagulated for chronic DVT on Coumadin  INR therapeutic at 3.2 -Will dose per pharmacy   Weakness - Consulting PT OT for evaluation recommendations   Personal history of DVT (deep vein thrombosis) Continue Coumadin  Left lower extremity Doppler positive for persistent chronic DVT   Stage 3a chronic kidney disease (CKD) (HCC) - Monitoring closely, avoiding nephrotoxins, avoiding hypotension -Mildly elevated BUN/creatinine from baseline  Lab Results  Component Value Date    CREATININE 1.59 (H) 08/18/2024    CREATININE 1.52 (H) 08/17/2024    CREATININE 1.41 (H) 06/22/2024    estimated creatinine clearance is 48.2 mL/min (A) (by C-G formula based on SCr of 1.59 mg/dL (H)).     Chronic diastolic HF (heart failure) (HCC) - Will monitor I's and O's, daily weights -Unfortunately patient needs fluid resuscitation due to meeting sepsis criteria -Will monitor closely for volume overload, resuming his home medication of 80 mg Lasix  in a.m. -Last echo March 2023 EGF 55-60% normal LV function normal RV function valves within normal limits, no LVH   Class 2 severe obesity due to excess calories with serious comorbidity and body mass index (BMI) of 35.0 to 35.9 in adult (HCC) Height 5'9, weight 99.8 kg, BMI 32.49 kg/m -Discussed with patient, advised follow-up with PCP regarding outpatient weight loss programs   OSA (obstructive sleep apnea) Monitor closely, nightly CPAP   Dyspnea on exertion - As needed DuoNeb bronchodilators -Continue home inhalers   Primary osteoarthritis of both feet - Continue current meds, With holding immunosuppressants   History of seizure  disorder - Stable, continue home medications   History of COPD No signs of exacerbation, as needed DuoNeb bronchodilators, continue home dose inhalers   COPD with acute exacerbation (HCC) - Currently stable, continue inhalers as needed, supplemental oxygen  as needed to maintain O2 sat greater than 90%   DVT (deep venous thrombosis) (HCC) - Chronically on Coumadin , bilateral lower extremity Doppler studies still revealing left lower extremity DVT, right cellulitic changes -INR therapeutic at 3.2   Malignant neoplasm of prostate [facility]) - Follow-up with urologist as outpatient   Rheumatoid arthritis (HCC) Stable continue meds   GERD Continue PPI        Consults called:  None -------------------------------------------------------------------------------------------------------------------------------------------- DVT prophylaxis:  SCDs Start: 08/18/24 1210 warfarin (COUMADIN ) tablet 3 mg    Code Status:   Code Status: Full Code     Admission status: Patient will be admitted as Inpatient, with a greater than 2 midnight length of stay. Level of care: Telemetry     Family Communication:  none at bedside  (The above findings and plan of care has been discussed with patient in detail, the patient expressed understanding and agreement of above plan)  --------------------------------------------------------------------------------------------------------------------------------------------------   Disposition Plan:  Anticipated 1-2 days Status is: Inpatient Remains inpatient appropriate because: Needing IV antibiotics, IV fluid, treatment for sepsis   ----------------------------------------------------------------------------------------------------------------------------------------   Time spent:  63  Min.  Was spent seeing and evaluating the patient, reviewing all medical records, drawn plan of care.   SIGNED: [name], MD, FHM. FAAFP. [place] [facility], Pager  (Please use [email or web address] to page/ or secure chat through epic) If 7PM-7AM, please contact night-coverage [email or web address],  08/18/2024, 12:37 PM

## 2024-08-23 NOTE — Progress Notes (Signed)
 PHARMACY CONSULT NOTE FOR:  OUTPATIENT  PARENTERAL ANTIBIOTIC THERAPY (OPAT)  Indication: MRSA Bacteremia Regimen: Daptomycin  700 mg every 24 hours  End date: 09/02/2024  IV antibiotic discharge orders are pended. To discharging provider:  please sign these orders via discharge navigator,  Select New Orders & click on the button choice - Manage This Unsigned Work.     Thank you for allowing pharmacy to be a part of this patient's care.  Damien Quiet, PharmD, BCPS, BCIDP Infectious Diseases Clinical Pharmacist Phone: [phone] 08/23/2024, 2:46 PM

## 2024-08-23 NOTE — Progress Notes (Signed)
 Peripherally Inserted Central Catheter Placement  The IV Nurse has discussed with the patient and/or persons authorized to consent for the patient, the purpose of this procedure and the potential benefits and risks involved with this procedure.  The benefits include less needle sticks, lab draws from the catheter, and the patient may be discharged home with the catheter. Risks include, but not limited to, infection, bleeding, blood clot (thrombus formation), and puncture of an artery; nerve damage and irregular heartbeat and possibility to perform a PICC exchange if needed/ordered by physician.  Alternatives to this procedure were also discussed.  Bard Power PICC patient education guide, fact sheet on infection prevention and patient information card has been provided to patient /or left at bedside.    PICC Placement Documentation  PICC Single Lumen 08-31-24 Right Basilic 45 cm 0 cm (Active)  Indication for Insertion or Continuance of Line Home intravenous therapies (PICC only) 08/31/2024 1707  Exposed Catheter (cm) 0 cm August 31, 2024 1707  Site Assessment Clean, Dry, Intact August 31, 2024 1707  Line Status Saline locked;Flushed;Blood return noted;Dead end cap in place 08/31/2024 1707  Dressing Type Transparent 08-31-2024 1707  Dressing Status Antimicrobial disc/dressing in place;Clean, Dry, Intact 08-31-2024 1707  Line Care Cap changed;Connections checked and tightened August 31, 2024 1707  Dressing Change Due 08/30/24 08-31-24 1707       Bari CHRISTELLA Bohr 08/31/2024, 5:32 PM

## 2024-08-23 NOTE — Progress Notes (Signed)
 Brief ID note: #MRSA bacteriemia 2/2 RT leg cellulitis # RA - on leflunomide , to be hold due to infection  #AKI on CKD improving #chronic DVT on warfarin -TEE no veg.  Plan on 2 weeks of iv abx form negative blood Cx -Place PICC -follow dapto mic -ID FU on 9/11 to do blood Cx off of antiobics  OPAT ORDERS:  Diagnosis: MRSA Bacteremia  Allergies  Allergen Reactions   Orencia  [Abatacept ] Anaphylaxis    Had prostate cancer   Carbamazepine Rash    Makes drowsy Brand name is Tegretol    Celecoxib Itching and Rash    Brand name is Celebrex   Cephalexin Rash    Severe Rash. Brand name is Keflex Patient has tolerated ceftriaxone  05/2019   Enbrel [Etanercept] Rash   Humira [Adalimumab] Rash   Levofloxacin  Other (See Comments)    GI Intolerance Brand name is Levaquin    Sulfa Antibiotics Rash   Sulfasalazine Rash     Discharge antibiotics to be given via PICC line:Daptomycin  700 mg every 24 hours   Per pharmacy protocol    Duration: 2 weeks End Date: 09/03/23  [facility] Care Per Protocol with [employer] Use: Home health RN for IV administration and teaching, line care and labs.    Labs weekly while on IV antibiotics: _x_ CBC with differential __ BMP **TWICE WEEKLY ON VANCOMYCIN   _x_ CMP _x_ CRP __ ESR __ Vancomycin  trough TWICE WEEKLY _x_ CK  __ Please pull PIC at completion of IV antibiotics __ Please leave PIC in place until doctor has seen patient or been notified  Fax weekly labs to [phone]  Clinic Follow Up Appt: Dr. Dea  @ [facility] with 9/11

## 2024-08-23 NOTE — Progress Notes (Signed)
 PHARMACY - ANTICOAGULATION CONSULT NOTE  Pharmacy Consult for Warfarin Indication: History of DVT  Allergies  Allergen Reactions   Orencia  [Abatacept ] Anaphylaxis    Had prostate cancer   Carbamazepine Rash    Makes drowsy Brand name is Tegretol    Celecoxib Itching and Rash    Brand name is Celebrex   Cephalexin Rash    Severe Rash. Brand name is Keflex Patient has tolerated ceftriaxone  05/2019   Enbrel [Etanercept] Rash   Humira [Adalimumab] Rash   Levofloxacin  Other (See Comments)    GI Intolerance Brand name is Levaquin    Sulfa Antibiotics Rash   Sulfasalazine Rash    Patient Measurements:  Wt: 99.8 kg (220 lb) Ht: 5' 9 (175.3 cm)  Vital Signs: Temp: 98.1 F (36.7 C) (08/25 0815) Temp Source: Oral (08/25 0815) BP: 146/84 (08/25 0815) Pulse Rate: 67 (08/25 0815)  Labs: Recent Labs    08/21/24 0435 08/22/24 0423 08/23/24 0506  HGB 13.8 13.9 13.7  HCT 41.7 42.7 42.3  PLT 153 157 161  LABPROT 21.5* 20.9* 21.0*  INR 1.8* 1.7* 1.7*  CREATININE 1.26* 1.31* 1.23    Estimated Creatinine Clearance: 62.6 mL/min (by C-G formula based on SCr of 1.23 mg/dL).   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 Unknown origin.   Shortness of breath dyspnea    exertion    Sleep apnea    cpap    Tubular adenoma of colon 04/2008    Medications:   PTA pt taking warfarin (3 mg) at bedtime.   Assessment:  Pt is a 16 YOM  with PMH of HF, COPD, DVT. He presented to ED with c/o fever and right leg pain and swelling. Pt has chronic leg issues due to history of DVT and had a DVT ultrasound 2 weeks ago that was negative. Last week he completed course of abx for cellulitis of the right leg. Yesterday pt developed new acute pain in his right leg along with swelling and redness. Pt is currently taking warfarin. Pharmacy was consulted for warfarin dosing due to history of DVT.   INR 3.2 > 3.5>2.5>1.8>1.7>1.7  No bleeding noted. CBC is stable. INR 1.7 today. Pt received extra warfarin the past two days and due to the time to peak concentration of warfarin there should likely be an increase in pt INR tomorrow due to the higher doses pt received the previous two nights.      Goal of Therapy:  INR 2-3 Monitor platelets by  anticoagulation protocol: Yes   Plan:  Warfarin 3 mg tonight.  Continue to monitor INR daily  Plan to discuss switching to Eliquis based on copay prior to discharge.  Veleria Ahle- PharmD Student 08/23/2024 10:08 AM

## 2024-08-23 NOTE — Anesthesia Preprocedure Evaluation (Signed)
 Anesthesia Evaluation  Patient identified by MRN, date of birth, ID band Patient awake    Reviewed: Allergy  & Precautions, H&P , NPO status , Patient's Chart, lab work & pertinent test results, reviewed documented beta blocker date and time   Airway Mallampati: II  TM Distance: >3 FB Neck ROM: full    Dental no notable dental hx.    Pulmonary shortness of breath, asthma , sleep apnea , pneumonia, COPD, former smoker   Pulmonary exam normal breath sounds clear to auscultation       Cardiovascular Exercise Tolerance: Good hypertension, +CHF   Rhythm:regular Rate:Normal     Neuro/Psych Seizures -,   negative psych ROS   GI/Hepatic Neg liver ROS,GERD  ,,  Endo/Other  negative endocrine ROS    Renal/GU Renal disease  negative genitourinary   Musculoskeletal   Abdominal   Peds  Hematology negative hematology ROS (+)   Anesthesia Other Findings 1. Poor Echo windows for evaluation of infective endocarditis. Valves not  well visualized. Consider TEE if clinically indicated.   2. No LV thrombus by Definity . Left ventricular ejection fraction, by  estimation, is 70 to 75%. The left ventricle has hyperdynamic function.  The left ventricle has no regional wall motion abnormalities. Left  ventricular diastolic parameters are  consistent with Grade I diastolic dysfunction (impaired relaxation).   3. Right ventricular systolic function was not well visualized. The right  ventricular size is normal. Tricuspid regurgitation signal is inadequate  for assessing PA pressure.   4. The mitral valve was not well visualized. No evidence of mitral valve  regurgitation. No evidence of mitral stenosis.   5. The aortic valve was not well visualized. Aortic valve regurgitation  is not visualized. No aortic stenosis is present.     Reproductive/Obstetrics negative OB ROS                              Anesthesia  Physical Anesthesia Plan  ASA: 4  Anesthesia Plan: General   Post-op Pain Management:    Induction:   PONV Risk Score and Plan: Propofol  infusion  Airway Management Planned:   Additional Equipment:   Intra-op Plan:   Post-operative Plan:   Informed Consent: I have reviewed the patients History and Physical, chart, labs and discussed the procedure including the risks, benefits and alternatives for the proposed anesthesia with the patient or authorized representative who has indicated his/her understanding and acceptance.     Dental Advisory Given  Plan Discussed with: CRNA  Anesthesia Plan Comments:          Anesthesia Quick Evaluation

## 2024-08-24 ENCOUNTER — Other Ambulatory Visit: Payer: Self-pay | Admitting: Physician Assistant

## 2024-08-24 ENCOUNTER — Encounter (HOSPITAL_COMMUNITY): Payer: Self-pay | Admitting: Cardiology

## 2024-08-24 ENCOUNTER — Telehealth: Payer: Self-pay

## 2024-08-24 DIAGNOSIS — G40909 Epilepsy, unspecified, not intractable, without status epilepticus: Secondary | ICD-10-CM | POA: Diagnosis not present

## 2024-08-24 DIAGNOSIS — G4733 Obstructive sleep apnea (adult) (pediatric): Secondary | ICD-10-CM | POA: Diagnosis not present

## 2024-08-24 DIAGNOSIS — I5032 Chronic diastolic (congestive) heart failure: Secondary | ICD-10-CM | POA: Diagnosis not present

## 2024-08-24 DIAGNOSIS — D696 Thrombocytopenia, unspecified: Secondary | ICD-10-CM | POA: Diagnosis not present

## 2024-08-24 DIAGNOSIS — J441 Chronic obstructive pulmonary disease with (acute) exacerbation: Secondary | ICD-10-CM | POA: Diagnosis not present

## 2024-08-24 DIAGNOSIS — M069 Rheumatoid arthritis, unspecified: Secondary | ICD-10-CM | POA: Diagnosis not present

## 2024-08-24 DIAGNOSIS — Z6832 Body mass index (BMI) 32.0-32.9, adult: Secondary | ICD-10-CM | POA: Diagnosis not present

## 2024-08-24 DIAGNOSIS — I82502 Chronic embolism and thrombosis of unspecified deep veins of left lower extremity: Secondary | ICD-10-CM | POA: Diagnosis not present

## 2024-08-24 DIAGNOSIS — L97811 Non-pressure chronic ulcer of other part of right lower leg limited to breakdown of skin: Secondary | ICD-10-CM | POA: Diagnosis not present

## 2024-08-24 DIAGNOSIS — M19072 Primary osteoarthritis, left ankle and foot: Secondary | ICD-10-CM | POA: Diagnosis not present

## 2024-08-24 DIAGNOSIS — M5030 Other cervical disc degeneration, unspecified cervical region: Secondary | ICD-10-CM | POA: Diagnosis not present

## 2024-08-24 DIAGNOSIS — I130 Hypertensive heart and chronic kidney disease with heart failure and stage 1 through stage 4 chronic kidney disease, or unspecified chronic kidney disease: Secondary | ICD-10-CM | POA: Diagnosis not present

## 2024-08-24 DIAGNOSIS — M109 Gout, unspecified: Secondary | ICD-10-CM | POA: Diagnosis not present

## 2024-08-24 DIAGNOSIS — I361 Nonrheumatic tricuspid (valve) insufficiency: Secondary | ICD-10-CM | POA: Diagnosis not present

## 2024-08-24 DIAGNOSIS — K5790 Diverticulosis of intestine, part unspecified, without perforation or abscess without bleeding: Secondary | ICD-10-CM | POA: Diagnosis not present

## 2024-08-24 DIAGNOSIS — E7800 Pure hypercholesterolemia, unspecified: Secondary | ICD-10-CM | POA: Diagnosis not present

## 2024-08-24 DIAGNOSIS — E66812 Obesity, class 2: Secondary | ICD-10-CM | POA: Diagnosis not present

## 2024-08-24 DIAGNOSIS — N179 Acute kidney failure, unspecified: Secondary | ICD-10-CM | POA: Diagnosis not present

## 2024-08-24 DIAGNOSIS — K589 Irritable bowel syndrome without diarrhea: Secondary | ICD-10-CM | POA: Diagnosis not present

## 2024-08-24 DIAGNOSIS — L03115 Cellulitis of right lower limb: Secondary | ICD-10-CM | POA: Diagnosis not present

## 2024-08-24 DIAGNOSIS — I872 Venous insufficiency (chronic) (peripheral): Secondary | ICD-10-CM | POA: Diagnosis not present

## 2024-08-24 DIAGNOSIS — M19071 Primary osteoarthritis, right ankle and foot: Secondary | ICD-10-CM | POA: Diagnosis not present

## 2024-08-24 DIAGNOSIS — K219 Gastro-esophageal reflux disease without esophagitis: Secondary | ICD-10-CM | POA: Diagnosis not present

## 2024-08-24 DIAGNOSIS — E876 Hypokalemia: Secondary | ICD-10-CM | POA: Diagnosis not present

## 2024-08-24 DIAGNOSIS — N1831 Chronic kidney disease, stage 3a: Secondary | ICD-10-CM | POA: Diagnosis not present

## 2024-08-24 NOTE — Transitions of Care (Post Inpatient/ED Visit) (Signed)
 08/24/2024  Name: [name] MRN: [id number] DOB: 02-Mar-1951  Today's TOC FU Call Status: Today's TOC FU Call Status:: Successful TOC FU Call Completed TOC FU Call Complete Date: 08/24/24 Patient's Name and Date of Birth confirmed.  Transition Care Management Follow-up Telephone Call Date of Discharge: 08/23/24 [facility] Type of Discharge: Inpatient Admission Primary Inpatient Discharge Diagnosis:: Sepsis How have you been since you were released from the hospital?: Better Any questions or concerns?: No  Items Reviewed: Did you receive and understand the discharge instructions provided?: Yes Medications obtained,verified, and reconciled?: Yes (Medications Reviewed) Any new allergies since your discharge?: No Dietary orders reviewed?: Yes Type of Diet Ordered:: Low Sodium Heart Healthy Do you have support at home?: Yes People in Home [RPT]: spouse Name of Support/Comfort Primary Source: Dagoberto  Medications Reviewed Today: Medications Reviewed Today     Reviewed by Brallan Denio, RN (Case Manager) on 08/24/24 at 1059  Med List Status: <None>   Medication Order Taking? Sig Documenting Provider Last Dose Status Informant  allopurinol  (ZYLOPRIM ) 100 MG tablet [id number]  Take 1 tablet (100 mg total) by mouth 2 (two) times daily. Cheryl Waddell HERO, PA-C  Active Self, Pharmacy Records  amLODipine  (NORVASC ) 10 MG tablet [id number]  Take 1 tablet (10 mg total) by mouth daily. Willette Adriana LABOR, MD  Active   ascorbic acid (VITAMIN C) 500 MG tablet [id number]  Take 500 mg by mouth at bedtime. [provider]  Active Self, Pharmacy Records  Budeson-Glycopyrrol-Formoterol  (BREZTRI  AEROSPHERE) 160-9-4.8 MCG/ACT AERO [id number]  Inhale 2 puffs into the lungs in the morning. Geronimo Amel, MD  Active Self, Pharmacy Records  cholecalciferol (VITAMIN D3) 25 MCG (1000 UNIT) tablet [id number]  Take 1,000 Units by mouth at bedtime. [provider]   Active Self, Pharmacy Records  daptomycin  (CUBICIN ) IVPB [id number]  Inject 700 mg into the vein daily for 10 days. Indication:  MRSA Bacteremia First Dose: Yes Last Day of Therapy:  09/02/24 Labs - Once weekly:  CBC/D, BMP, and CPK Labs - Once weekly: ESR and CRP Method of administration: IV Push Method of administration may be changed at the discretion of home infusion pharmacist based upon assessment of the patient and/or caregiver's ability to self-administer the medication ordered. Dennise Kingsley, MD  Active   dicyclomine  (BENTYL ) 10 MG capsule [id number]  TAKE 1 CAPSULE BY MOUTH 4 TIMES DAILY BEFORE MEALS AND AT BEDTIME  Patient taking differently: Take 20 mg by mouth 2 (two) times daily.   Aneita Gwendlyn DASEN, MD  Active Self, Pharmacy Records  furosemide  (LASIX ) 80 MG tablet [id number]  TAKE 1 TABLET BY MOUTH EVERY DAY  Patient taking differently: Take 80 mg by mouth at bedtime.   Croitoru, Mihai, MD  Active Self, Pharmacy Records  lactobacillus acidophilus & bulgar (LACTINEX) chewable tablet [id number] Yes Chew 1 tablet by mouth 3 (three) times daily with meals for 14 days. Willette Adriana LABOR, MD  Active   leflunomide  (ARAVA ) 20 MG tablet [id number]  TAKE 1 TABLET BY MOUTH EVERY DAY Cheryl Waddell HERO, PA-C  Active Self, Pharmacy Records  mupirocin  ointment (BACTROBAN ) 2 % [id number]  Apply topically 3 (three) times daily. Willette Adriana LABOR, MD  Active   omeprazole  (PRILOSEC) 40 MG capsule [id number] Yes Take 1 capsule (40 mg total) by mouth daily. Pearlean Manus, MD  Active Self, Pharmacy Records  oxybutynin  (DITROPAN -XL) 10 MG 24 hr tablet [id number] Yes Take 1 tablet (10 mg total) by mouth daily. McKenzie, Belvie CROME, MD  Active  Self, Pharmacy Records  phenytoin  (DILANTIN ) 100 MG ER capsule [id number] Yes Take 100-200 mg by mouth 2 (two) times daily. Take 1 capsule in the morning and 2 capsules at bedtime [provider]  Active Self, Pharmacy Records           Med Note [facility], COURAGE    Sat Jan 25, 2023  3:10 PM)    Potassium Chloride  ER 20 MEQ TBCR [id number] Yes Take 20 mEq by mouth daily. [provider]  Active Self, Pharmacy Records  rosuvastatin  (CRESTOR ) 10 MG tablet [id number] Yes Take 10 mg by mouth at bedtime. [provider]  Active Self, Pharmacy Records  spironolactone  (ALDACTONE ) 50 MG tablet [id number] Yes Take 1 tablet (50 mg total) by mouth daily. Willette Adriana LABOR, MD  Active   Tocilizumab  (ACTEMRA  IV) [id number] Yes Inject into the vein every 28 (twenty-eight) days. [provider]  Active Self, Pharmacy Records  warfarin (COUMADIN ) 4 MG tablet [id number] Yes Take 4 mg by mouth at bedtime. [provider]  Active Self, Pharmacy Records           Med Note (WARD, ANGELICA G   Wed Aug 18, 2024  7:48 PM) Pt did verify his Warfarin was changed from 3 mg to 4 mg, did mention someone told him (unsure of who) that it was making his blood too thin so they would be lowering it at some point or changing the directions. Pt did verify that prior to admission pt was taking 4 mg of Warfarin daily.  Med List Note Kerrin Eleanor SAUNDERS, CPhT 12/21/20 1110): Pt has CPAP Machine             Home Care and Equipment/Supplies: Were Home Health Services Ordered?: Yes Name of Home Health Agency:: [facility] Has Agency set up a time to come to your home?: Yes First Home Health Visit Date: 08/24/24 [facility] to provide IV antibiotc therapy and wound care per patient.) Any new equipment or medical supplies ordered?: Yes Name of Medical supply agency?: [facility] Were you able to get the equipment/medical supplies?: No (IV antibiotic supplies due for delivery today at 2:00 pm per patient) Do you have any questions related to the use of the equipment/supplies?: No  Functional Questionnaire: Do you need assistance with bathing/showering or dressing?: No Do you need assistance with meal preparation?: No Do you need assistance with eating?: No Do you have  difficulty maintaining continence: No Do you need assistance with getting out of bed/getting out of a chair/moving?: No Do you have difficulty managing or taking your medications?: No  Follow up appointments reviewed: PCP Follow-up appointment confirmed?: No (Patient states he will call) MD Provider Line Number:[phone] Given: No Specialist Hospital Follow-up appointment confirmed?: No Reason Specialist Follow-Up Not Confirmed: Patient has Specialist Provider Number and will Call for Appointment Do you need transportation to your follow-up appointment?: No Do you understand care options if your condition(s) worsen?: Yes-patient verbalized understanding  SDOH Interventions Today    Flowsheet Row Most Recent Value  SDOH Interventions   Food Insecurity Interventions Intervention Not Indicated  Housing Interventions Intervention Not Indicated  Transportation Interventions Intervention Not Indicated  Utilities Interventions Intervention Not Indicated    Lavaun DOROTHA Seeds RN, MSN [facility]  [facility], [facility] RN Care Manager Direct Dial: [phone]  Fax: [phone] Website: [email or web address]

## 2024-08-24 NOTE — Telephone Encounter (Signed)
 Recent hospitalization on 08/18/2024 for sepsis due to cellulitis and MRSA.

## 2024-08-24 NOTE — Patient Instructions (Signed)
 Visit Information  Thank you for taking time to visit with me today. Please don't hesitate to contact me if I can be of assistance to you.    Call MD for: redness, tenderness, or signs of infection (pain, swelling, redness, odor or green/yellow discharge around incision site) Call MD for: severe uncontrolled pain Call MD for: temperature >100.4 Change dressing on IV access line weekly and PRN   Patient verbalizes understanding of instructions and care plan provided today and agrees to view in MyChart. Active MyChart status and patient understanding of how to access instructions and care plan via MyChart confirmed with patient.     The patient has been provided with contact information for the care management team and has been advised to call with any health related questions or concerns.   Please call the care guide team at [phone] if you need to cancel or reschedule your appointment.   Please call the Suicide and Crisis Lifeline: 988 if you are experiencing a Mental Health or Behavioral Health Crisis or need someone to talk to.  Ailed Defibaugh J. Karrisa Didio RN, MSN [facility], [facility] RN Care Manager Direct Dial: [phone]  Fax: [phone] Website: [email or web address]

## 2024-08-25 DIAGNOSIS — Z6834 Body mass index (BMI) 34.0-34.9, adult: Secondary | ICD-10-CM | POA: Diagnosis not present

## 2024-08-25 DIAGNOSIS — A419 Sepsis, unspecified organism: Secondary | ICD-10-CM | POA: Diagnosis not present

## 2024-08-25 DIAGNOSIS — I872 Venous insufficiency (chronic) (peripheral): Secondary | ICD-10-CM | POA: Diagnosis not present

## 2024-08-25 DIAGNOSIS — L03115 Cellulitis of right lower limb: Secondary | ICD-10-CM | POA: Diagnosis not present

## 2024-08-25 DIAGNOSIS — I130 Hypertensive heart and chronic kidney disease with heart failure and stage 1 through stage 4 chronic kidney disease, or unspecified chronic kidney disease: Secondary | ICD-10-CM | POA: Diagnosis not present

## 2024-08-25 DIAGNOSIS — I5032 Chronic diastolic (congestive) heart failure: Secondary | ICD-10-CM | POA: Diagnosis not present

## 2024-08-25 DIAGNOSIS — Z7901 Long term (current) use of anticoagulants: Secondary | ICD-10-CM | POA: Diagnosis not present

## 2024-08-25 DIAGNOSIS — N179 Acute kidney failure, unspecified: Secondary | ICD-10-CM | POA: Diagnosis not present

## 2024-08-25 DIAGNOSIS — L97811 Non-pressure chronic ulcer of other part of right lower leg limited to breakdown of skin: Secondary | ICD-10-CM | POA: Diagnosis not present

## 2024-08-25 DIAGNOSIS — E6609 Other obesity due to excess calories: Secondary | ICD-10-CM | POA: Diagnosis not present

## 2024-08-25 DIAGNOSIS — A46 Erysipelas: Secondary | ICD-10-CM | POA: Diagnosis not present

## 2024-08-25 LAB — CULTURE, BLOOD (ROUTINE X 2)
Culture: NO GROWTH
Culture: NO GROWTH

## 2024-08-27 DIAGNOSIS — L03115 Cellulitis of right lower limb: Secondary | ICD-10-CM | POA: Diagnosis not present

## 2024-08-27 DIAGNOSIS — I872 Venous insufficiency (chronic) (peripheral): Secondary | ICD-10-CM | POA: Diagnosis not present

## 2024-08-27 DIAGNOSIS — N179 Acute kidney failure, unspecified: Secondary | ICD-10-CM | POA: Diagnosis not present

## 2024-08-27 DIAGNOSIS — L97811 Non-pressure chronic ulcer of other part of right lower leg limited to breakdown of skin: Secondary | ICD-10-CM | POA: Diagnosis not present

## 2024-08-27 DIAGNOSIS — I130 Hypertensive heart and chronic kidney disease with heart failure and stage 1 through stage 4 chronic kidney disease, or unspecified chronic kidney disease: Secondary | ICD-10-CM | POA: Diagnosis not present

## 2024-08-27 DIAGNOSIS — I5032 Chronic diastolic (congestive) heart failure: Secondary | ICD-10-CM | POA: Diagnosis not present

## 2024-08-27 LAB — MINIMUM INHIBITORY CONC. (1 DRUG)

## 2024-08-27 LAB — MIC RESULT

## 2024-08-28 NOTE — Anesthesia Postprocedure Evaluation (Signed)
 Anesthesia Post Note  Patient: [name]  Procedure(s) Performed: ECHOCARDIOGRAM, TRANSESOPHAGEAL  Patient location during evaluation: Phase II Anesthesia Type: General Level of consciousness: awake Pain management: pain level controlled Vital Signs Assessment: post-procedure vital signs reviewed and stable Respiratory status: spontaneous breathing and respiratory function stable Cardiovascular status: blood pressure returned to baseline and stable Postop Assessment: no headache and no apparent nausea or vomiting Anesthetic complications: no Comments: Late entry   No notable events documented.   Last Vitals:  Vitals:   08/23/24 1415 08/23/24 1430  BP: (!) 95/48 (!) 115/57  Pulse: 63 63  Resp: 16 17  Temp:  36.5 C  SpO2: 98% 98%    Last Pain:  Vitals:   08/23/24 1430  TempSrc:   PainSc: 0-No pain                 Yvonna JINNY Bosworth

## 2024-08-29 DIAGNOSIS — I130 Hypertensive heart and chronic kidney disease with heart failure and stage 1 through stage 4 chronic kidney disease, or unspecified chronic kidney disease: Secondary | ICD-10-CM | POA: Diagnosis not present

## 2024-08-29 DIAGNOSIS — L03115 Cellulitis of right lower limb: Secondary | ICD-10-CM | POA: Diagnosis not present

## 2024-08-29 DIAGNOSIS — I5032 Chronic diastolic (congestive) heart failure: Secondary | ICD-10-CM | POA: Diagnosis not present

## 2024-08-29 DIAGNOSIS — N179 Acute kidney failure, unspecified: Secondary | ICD-10-CM | POA: Diagnosis not present

## 2024-08-29 DIAGNOSIS — L97811 Non-pressure chronic ulcer of other part of right lower leg limited to breakdown of skin: Secondary | ICD-10-CM | POA: Diagnosis not present

## 2024-08-29 DIAGNOSIS — I872 Venous insufficiency (chronic) (peripheral): Secondary | ICD-10-CM | POA: Diagnosis not present

## 2024-08-31 DIAGNOSIS — N179 Acute kidney failure, unspecified: Secondary | ICD-10-CM | POA: Diagnosis not present

## 2024-08-31 DIAGNOSIS — L03115 Cellulitis of right lower limb: Secondary | ICD-10-CM | POA: Diagnosis not present

## 2024-08-31 DIAGNOSIS — Z792 Long term (current) use of antibiotics: Secondary | ICD-10-CM | POA: Diagnosis not present

## 2024-08-31 DIAGNOSIS — I130 Hypertensive heart and chronic kidney disease with heart failure and stage 1 through stage 4 chronic kidney disease, or unspecified chronic kidney disease: Secondary | ICD-10-CM | POA: Diagnosis not present

## 2024-08-31 DIAGNOSIS — I5032 Chronic diastolic (congestive) heart failure: Secondary | ICD-10-CM | POA: Diagnosis not present

## 2024-08-31 DIAGNOSIS — I872 Venous insufficiency (chronic) (peripheral): Secondary | ICD-10-CM | POA: Diagnosis not present

## 2024-08-31 DIAGNOSIS — L97811 Non-pressure chronic ulcer of other part of right lower leg limited to breakdown of skin: Secondary | ICD-10-CM | POA: Diagnosis not present

## 2024-09-01 ENCOUNTER — Telehealth: Payer: Self-pay

## 2024-09-01 NOTE — Telephone Encounter (Signed)
 Yes ok to pull picc after last dose on 9/4

## 2024-09-01 NOTE — Telephone Encounter (Signed)
 Received following message from [facility] with [facility] regarding pull picc orders. Will forward message to Dr. Dennise and Dr. Dea.  Hey there. Need your help with a pull picc order for [name].  Dr. Jurline OPAT at [facility] does not have either blank marked to leave or pull.  Pt will finish IVABX on 9/4 and sees Dr. Dea on 9/11.   RN has reached out to me for follow on order. Please advise.  Thanks!   Lorenda CHRISTELLA Code, RMA

## 2024-09-02 ENCOUNTER — Encounter: Payer: Self-pay | Admitting: Physician Assistant

## 2024-09-02 ENCOUNTER — Other Ambulatory Visit: Payer: Self-pay

## 2024-09-02 ENCOUNTER — Encounter: Attending: Rheumatology | Admitting: Physician Assistant

## 2024-09-02 VITALS — BP 139/80 | HR 67 | Resp 14 | Ht 69.0 in | Wt 220.4 lb

## 2024-09-02 DIAGNOSIS — M19072 Primary osteoarthritis, left ankle and foot: Secondary | ICD-10-CM | POA: Diagnosis not present

## 2024-09-02 DIAGNOSIS — Z8719 Personal history of other diseases of the digestive system: Secondary | ICD-10-CM | POA: Diagnosis not present

## 2024-09-02 DIAGNOSIS — Z86718 Personal history of other venous thrombosis and embolism: Secondary | ICD-10-CM | POA: Insufficient documentation

## 2024-09-02 DIAGNOSIS — Z8679 Personal history of other diseases of the circulatory system: Secondary | ICD-10-CM | POA: Insufficient documentation

## 2024-09-02 DIAGNOSIS — Z8546 Personal history of malignant neoplasm of prostate: Secondary | ICD-10-CM | POA: Insufficient documentation

## 2024-09-02 DIAGNOSIS — M0579 Rheumatoid arthritis with rheumatoid factor of multiple sites without organ or systems involvement: Secondary | ICD-10-CM | POA: Diagnosis not present

## 2024-09-02 DIAGNOSIS — L0390 Cellulitis, unspecified: Secondary | ICD-10-CM | POA: Diagnosis not present

## 2024-09-02 DIAGNOSIS — M19042 Primary osteoarthritis, left hand: Secondary | ICD-10-CM | POA: Diagnosis not present

## 2024-09-02 DIAGNOSIS — M19071 Primary osteoarthritis, right ankle and foot: Secondary | ICD-10-CM | POA: Diagnosis not present

## 2024-09-02 DIAGNOSIS — Z8639 Personal history of other endocrine, nutritional and metabolic disease: Secondary | ICD-10-CM | POA: Insufficient documentation

## 2024-09-02 DIAGNOSIS — A419 Sepsis, unspecified organism: Secondary | ICD-10-CM | POA: Diagnosis not present

## 2024-09-02 DIAGNOSIS — Z860101 Personal history of adenomatous and serrated colon polyps: Secondary | ICD-10-CM | POA: Diagnosis not present

## 2024-09-02 DIAGNOSIS — Z8669 Personal history of other diseases of the nervous system and sense organs: Secondary | ICD-10-CM | POA: Insufficient documentation

## 2024-09-02 DIAGNOSIS — M810 Age-related osteoporosis without current pathological fracture: Secondary | ICD-10-CM | POA: Insufficient documentation

## 2024-09-02 DIAGNOSIS — Z5181 Encounter for therapeutic drug level monitoring: Secondary | ICD-10-CM

## 2024-09-02 DIAGNOSIS — Z79899 Other long term (current) drug therapy: Secondary | ICD-10-CM | POA: Diagnosis not present

## 2024-09-02 DIAGNOSIS — M19041 Primary osteoarthritis, right hand: Secondary | ICD-10-CM | POA: Diagnosis present

## 2024-09-02 DIAGNOSIS — Z13220 Encounter for screening for lipoid disorders: Secondary | ICD-10-CM

## 2024-09-02 DIAGNOSIS — Z8709 Personal history of other diseases of the respiratory system: Secondary | ICD-10-CM | POA: Diagnosis not present

## 2024-09-02 DIAGNOSIS — R7989 Other specified abnormal findings of blood chemistry: Secondary | ICD-10-CM

## 2024-09-02 DIAGNOSIS — M1A09X0 Idiopathic chronic gout, multiple sites, without tophus (tophi): Secondary | ICD-10-CM | POA: Insufficient documentation

## 2024-09-02 DIAGNOSIS — M170 Bilateral primary osteoarthritis of knee: Secondary | ICD-10-CM | POA: Diagnosis not present

## 2024-09-02 NOTE — Progress Notes (Unsigned)
 Please review and sign, patient seen in office today.

## 2024-09-03 ENCOUNTER — Telehealth (HOSPITAL_COMMUNITY): Payer: Self-pay

## 2024-09-03 DIAGNOSIS — I5032 Chronic diastolic (congestive) heart failure: Secondary | ICD-10-CM | POA: Diagnosis not present

## 2024-09-03 DIAGNOSIS — I872 Venous insufficiency (chronic) (peripheral): Secondary | ICD-10-CM | POA: Diagnosis not present

## 2024-09-03 DIAGNOSIS — I130 Hypertensive heart and chronic kidney disease with heart failure and stage 1 through stage 4 chronic kidney disease, or unspecified chronic kidney disease: Secondary | ICD-10-CM | POA: Diagnosis not present

## 2024-09-03 DIAGNOSIS — L03115 Cellulitis of right lower limb: Secondary | ICD-10-CM | POA: Diagnosis not present

## 2024-09-03 DIAGNOSIS — N179 Acute kidney failure, unspecified: Secondary | ICD-10-CM | POA: Diagnosis not present

## 2024-09-03 DIAGNOSIS — L97811 Non-pressure chronic ulcer of other part of right lower leg limited to breakdown of skin: Secondary | ICD-10-CM | POA: Diagnosis not present

## 2024-09-03 NOTE — Telephone Encounter (Signed)
 Auth Submission: NO AUTH NEEDED Site of care: Site of care: [facility] Payer: Medicare A/B Medication & CPT/J Code(s) submitted: Acemtra Diagnosis Code: M05.79 Route of submission (phone, fax, portal):  Phone # Fax # Auth type: Buy/Bill HB Units/visits requested: 4mg /kg q28 days (400mg ) Reference number:  Approval from: 06/21/24 to 01/29/25

## 2024-09-06 DIAGNOSIS — I872 Venous insufficiency (chronic) (peripheral): Secondary | ICD-10-CM | POA: Diagnosis not present

## 2024-09-06 DIAGNOSIS — I5032 Chronic diastolic (congestive) heart failure: Secondary | ICD-10-CM | POA: Diagnosis not present

## 2024-09-06 DIAGNOSIS — L97811 Non-pressure chronic ulcer of other part of right lower leg limited to breakdown of skin: Secondary | ICD-10-CM | POA: Diagnosis not present

## 2024-09-06 DIAGNOSIS — L03115 Cellulitis of right lower limb: Secondary | ICD-10-CM | POA: Diagnosis not present

## 2024-09-06 DIAGNOSIS — I130 Hypertensive heart and chronic kidney disease with heart failure and stage 1 through stage 4 chronic kidney disease, or unspecified chronic kidney disease: Secondary | ICD-10-CM | POA: Diagnosis not present

## 2024-09-06 DIAGNOSIS — N179 Acute kidney failure, unspecified: Secondary | ICD-10-CM | POA: Diagnosis not present

## 2024-09-09 ENCOUNTER — Ambulatory Visit (INDEPENDENT_AMBULATORY_CARE_PROVIDER_SITE_OTHER): Payer: Self-pay | Admitting: Infectious Diseases

## 2024-09-09 ENCOUNTER — Other Ambulatory Visit: Payer: Self-pay

## 2024-09-09 VITALS — BP 146/74 | HR 71 | Temp 98.2°F | Wt 222.0 lb

## 2024-09-09 DIAGNOSIS — L03115 Cellulitis of right lower limb: Secondary | ICD-10-CM

## 2024-09-09 DIAGNOSIS — Z452 Encounter for adjustment and management of vascular access device: Secondary | ICD-10-CM | POA: Diagnosis not present

## 2024-09-09 DIAGNOSIS — R600 Localized edema: Secondary | ICD-10-CM | POA: Diagnosis not present

## 2024-09-09 DIAGNOSIS — Z79899 Other long term (current) drug therapy: Secondary | ICD-10-CM

## 2024-09-09 NOTE — Progress Notes (Addendum)
 Patient Active Problem List   Diagnosis Date Noted   MRSA bacteremia 08/20/2024   MRSA infection 08/20/2024   Sepsis (HCC) 08/18/2024   Acute respiratory failure with hypoxia (HCC) 01/23/2023   Stage 3a chronic kidney disease (CKD) (HCC) 03/11/2022   Bronchiectasis without complication (HCC) 03/26/2021   Incontinence when straining, male 03/20/2021   Erectile dysfunction after radical prostatectomy 09/08/2020   OAB (overactive bladder) 02/23/2020   Class 2 severe obesity due to excess calories with serious comorbidity and body mass index (BMI) of 35.0 to 35.9 in adult [facility])    Chronic diastolic HF (heart failure) (HCC)    OSA (obstructive sleep apnea) 03/10/2019   Dyspnea on exertion 10/22/2017   History of seizure disorder 02/27/2017   Primary osteoarthritis of both hands 02/27/2017   Primary osteoarthritis of both feet 02/27/2017   Idiopathic gout of multiple sites 02/27/2017   History of prostate cancer 12/29/2016   Primary osteoarthritis of both knees 12/29/2016   Chronic idiopathic gout involving toe without tophus 12/29/2016   History of COPD 12/29/2016   Plantar pustular psoriasis 12/29/2016   DVT (deep venous thrombosis) (HCC) 10/31/2016   COPD with acute exacerbation (HCC) 10/31/2016   CHF (congestive heart failure) (HCC) 10/31/2016   Prostate cancer (HCC) 08/30/2015   Malignant neoplasm of prostate (HCC) 07/04/2015   Chest pain at rest 07/05/2014   Weakness 07/05/2014   Complicated postphlebitic syndrome 11/16/2013   Personal history of DVT (deep vein thrombosis) 05/18/2013   Chronic anticoagulation 05/18/2013   Diverticulosis of colon without hemorrhage 05/18/2013   Rheumatoid arthritis (HCC) 05/18/2013   Seizure disorder (HCC) 05/18/2013   History of colonic polyps 05/18/2013   GERD 05/08/2010    Patient's Medications  New Prescriptions   No medications on file  Previous Medications   ALLOPURINOL  (ZYLOPRIM ) 100 MG TABLET    Take 1 tablet (100 mg total)  by mouth 2 (two) times daily.   AMLODIPINE  (NORVASC ) 10 MG TABLET    Take 1 tablet (10 mg total) by mouth daily.   ASCORBIC ACID (VITAMIN C) 500 MG TABLET    Take 500 mg by mouth at bedtime.   BUDESON-GLYCOPYRROL-FORMOTEROL  (BREZTRI  AEROSPHERE) 160-9-4.8 MCG/ACT AERO    Inhale 2 puffs into the lungs in the morning.   CHOLECALCIFEROL (VITAMIN D3) 25 MCG (1000 UNIT) TABLET    Take 1,000 Units by mouth at bedtime.   DICYCLOMINE  (BENTYL ) 10 MG CAPSULE    TAKE 1 CAPSULE BY MOUTH 4 TIMES DAILY BEFORE MEALS AND AT BEDTIME   FUROSEMIDE  (LASIX ) 80 MG TABLET    TAKE 1 TABLET BY MOUTH EVERY DAY   LEFLUNOMIDE  (ARAVA ) 20 MG TABLET    TAKE 1 TABLET BY MOUTH EVERY DAY   MUPIROCIN  OINTMENT (BACTROBAN ) 2 %    Apply topically 3 (three) times daily.   OMEPRAZOLE  (PRILOSEC) 40 MG CAPSULE    Take 1 capsule (40 mg total) by mouth daily.   OXYBUTYNIN  (DITROPAN -XL) 10 MG 24 HR TABLET    Take 1 tablet (10 mg total) by mouth daily.   PHENYTOIN  (DILANTIN ) 100 MG ER CAPSULE    Take 100-200 mg by mouth 2 (two) times daily. Take 1 capsule in the morning and 2 capsules at bedtime   POTASSIUM CHLORIDE  ER 20 MEQ TBCR    Take 20 mEq by mouth daily.   ROSUVASTATIN  (CRESTOR ) 10 MG TABLET    Take 10 mg by mouth at bedtime.   SPIRONOLACTONE  (ALDACTONE ) 50 MG TABLET    Take 1 tablet (50  mg total) by mouth daily.   TOCILIZUMAB  (ACTEMRA  IV)    Inject into the vein every 28 (twenty-eight) days.   WARFARIN (COUMADIN ) 4 MG TABLET    Take 4 mg by mouth at bedtime.  Modified Medications   No medications on file  Discontinued Medications   No medications on file    Subjective: Discussed the use of AI scribe software for clinical note transcription with the patient, who gave verbal consent to proceed.   73 year old male with prior history of RA on leflunomide , CHF, asthma/COPD, DVT of lower extremities with chronic RLE edema, GERD, Gout, HLD, IBS, prostate cancer s/p radical prostatectomy with pelvic lymphadenectomy, Seizures, OSA,  Serratia bacteremia in June 2020  who is here for HFU after recent admission 8/20-8/25 for MRSA bacteremia secondary to right leg cellulitis.  Discharged on 8/25 to complete 2 weeks course of daptomycin  through 9/4.   9/11 Accompanied by family member.  Completed course of IV daptomycin .  PICC has been removed.  Prior symptoms of cellulitis of right leg improved and feels much better. Reports chronic swelling of bilateral lower extremities with right swelling more than left.  No concerns today.   Review of Systems: Denies fever, chills.  Denies nausea, vomiting or diarrhea.  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 Unknown origin.   Shortness of breath dyspnea    exertion    Sleep apnea    cpap    Tubular adenoma of colon 04/2008   Past Surgical History:  Procedure Laterality Date   CHOLECYSTECTOMY     CIRCUMCISION     COLONOSCOPY     CYSTOSCOPY WITH LITHOLAPAXY N/A 03/17/2019   Procedure: CYSTOSCOPY WITH LITHOLAPAXY AND REMOVAL OF FOREIGN BODY;  Surgeon: Sherrilee Belvie CROME, MD;  Location: [facility];  Service: Urology;  Laterality: N/A;   DOPPLER ECHOCARDIOGRAPHY N/A  01-21-2012   TECHNICALLY DIFFICULT. MILD CONCENTRIC LV HYPERTROPHY. LV CAVITY IS SMALL.. EF=> 55%. TRANSMITRAL SPECTRAL FLOW PATTREN IS SUGGESTIVE OF IMPAIRED LV RELAXATION. RV SYSTOLIC PRESSURE IS . LEFT ATRIAL SIZE IS NORMAL. AV APPEARS MILDLY SCLEROTIC. NO SIGN VALVE DISEASE NOTED.   LEFT HEART CATH AND CORONARY ANGIOGRAPHY N/A 03/12/2022   Procedure: LEFT HEART CATH AND CORONARY ANGIOGRAPHY;  Surgeon: Wonda Sharper, MD;  Location: [facility] INV[facility]IVE CV LAB;  Service: Cardiovascular;  Laterality: N/A;   LYMPHADENECTOMY Bilateral 08/30/2015   Procedure: PELVIC LYMPHADENECTOMY;  Surgeon: Belvie CROME Sherrilee, MD;  Location: [facility];  Service: Urology;  Laterality: Bilateral;   NUCLEAR STRESS TEST N/A 01-21-2012   NORMAL PATTERN OF PERFUSION IN ALL REGIONS. POST STRESS LV SIZE IS NORMAL. NO EVIDENCE OF INDUCIBLE ISCHEMIA. EF 54%.   POLYPECTOMY     PROSTATE BIOPSY     ROBOT ASSISTED LAPAROSCOPIC RADICAL PROSTATECTOMY N/A 08/30/2015   Procedure: ROBOTIC ASSISTED LAPAROSCOPIC RADICAL PROSTATECTOMY;  Surgeon: Belvie LITTIE Clara, MD;  Location: [facility];  Service: Urology;  Laterality: N/A;   TEE WITHOUT CARDIOVERSION N/A 08/23/2024   Procedure: ECHOCARDIOGRAM, TRANSESOPHAGEAL;  Surgeon: Alvan Dorn FALCON, MD;  Location: [facility];  Service: Endoscopy;  Laterality: N/A;   URINARY SPHINCTER IMPLANT N/A 03/20/2021   Procedure: ARTIFICIAL URINARY SPHINCTER CYSTOSCOPY;  Surgeon: Gaston Hamilton, MD;  Location: [facility];  Service: Urology;  Laterality: N/A;  REQUESTING 90 MINS   US  VENOUS LOWER EXT Right 03/07/11   PERSISTENT DVT IN RIGHT LOWER EXT.WITH PERSISTANT VISUALIZATION OF HYPOECHOIC THROMBUS WITHIN THE FEMORAL, PROFUNDA FEMORAL AND POPLITEAL VEINS. WHEN COMPARED TO PREVIOUS, CLOT IS NO LONGER IDENTIFIED WITH IN THE RIGHT CFV.    Social History   Tobacco Use   Smoking status: Former    Current packs/day: 0.00    Average packs/day: 3.0 packs/day for 20.0 years (60.0 ttl pk-yrs)    Types: Cigarettes    Start  date: 12/30/1976    Quit date: 12/30/1996    Years since quitting: 27.7    Passive exposure: Never   Smokeless tobacco: Never  Vaping Use   Vaping status: Never Used  Substance Use Topics   Alcohol use: No    Alcohol/week: 0.0 standard drinks of alcohol   Drug use: No    Family History  Problem Relation Age of Onset   Alzheimer's disease Mother    Skin cancer Father    Stomach cancer Father    Heart attack Father    Breast cancer Sister    Heart attack Sister    Stroke Sister    Bladder Cancer Sister    Heart murmur Sister    Esophageal cancer Brother    Throat cancer Brother    Stomach cancer Brother    Lung cancer Brother    Colon cancer Brother    Heart attack Brother    Heart attack Brother    Colon cancer Brother    Colon polyps Neg Hx    Rectal cancer Neg Hx     Allergies  Allergen Reactions   Orencia  [Abatacept ] Anaphylaxis    Had prostate cancer   Carbamazepine Rash    Makes drowsy Brand name is Tegretol    Celecoxib Itching and Rash    Brand name is Celebrex   Cephalexin Rash    Severe Rash. Brand name is Keflex Patient has tolerated ceftriaxone  05/2019   Enbrel [Etanercept] Rash   Humira [Adalimumab] Rash   Levofloxacin  Other (See Comments)    GI Intolerance Brand name is Levaquin    Sulfa Antibiotics Rash   Sulfasalazine Rash    Health Maintenance  Topic Date Due   Hepatitis C Screening  Never done   Zoster Vaccines- Shingrix (1 of 2) Never done   DTaP/Tdap/Td (2 - Tdap) 07/05/2006   Pneumococcal Vaccine: 50+ Years (2 of 2 - PPSV23, PCV20, or PCV21) 11/30/2018   Medicare Annual Wellness (AWV)  12/14/2023   Influenza Vaccine  07/30/2024   COVID-19 Vaccine (5 - 2025-26 season) 08/30/2024   Colonoscopy  09/14/2028   HPV VACCINES  Aged Out   Meningococcal B Vaccine  Aged Out  Objective: BP (!) 146/74   Pulse 71   Temp 98.2 F (36.8 C) (Oral)   Wt 222 lb (100.7 kg)   SpO2 94%   BMI 32.78 kg/m   Physical Exam Constitutional:       Appearance: Normal appearance.  HENT:     Head: Normocephalic and atraumatic.      Mouth: Mucous membranes are moist.  Eyes:    Conjunctiva/sclera: Conjunctivae normal.     Pupils: Pupils are equal, round, and b/l symmetrical    Cardiovascular:     Rate and Rhythm: Normal rate and regular rhythm.     Heart sounds:   Pulmonary:     Effort: Pulmonary effort is normal.     Breath sounds: Normal breath sounds.   Abdominal:     General: Non distended     Palpations: soft.   Musculoskeletal:        General: Normal range of motion.  No signs of cellulitis in bilateral extremities including right leg.  He does have signs of venous insufficiency/venous hyperpigmentation in the bilateral lower legs     Skin:    General: Skin is warm and dry.     Comments:  Neurological:     General: grossly non focal     Mental Status: awake, alert and oriented to person, place, and time.   Psychiatric:        Mood and Affect: Mood normal.   Lab Results Lab Results  Component Value Date   WBC 7.0 08/23/2024   HGB 13.7 08/23/2024   HCT 42.3 08/23/2024   MCV 89.8 08/23/2024   PLT 161 08/23/2024    Lab Results  Component Value Date   CREATININE 1.23 08/23/2024   BUN 16 08/23/2024   NA 141 08/23/2024   K 4.2 08/23/2024   CL 108 08/23/2024   CO2 27 08/23/2024    Lab Results  Component Value Date   ALT 22 08/18/2024   AST 29 08/18/2024   ALKPHOS 58 08/18/2024   BILITOT 1.0 08/18/2024    Lab Results  Component Value Date   CHOL 155 03/01/2024   HDL 78 03/01/2024   LDLCALC 56 03/01/2024   TRIG 103 03/01/2024   CHOLHDL 2.0 03/01/2024   No results found for: LABRPR, RPRTITER No results found for: HIV1RNAQUANT, HIV1RNAVL, CD4TABS   Microbiology Results for orders placed or performed during the hospital encounter of 08/18/24  Blood Culture (routine x 2)     Status: None   Collection Time: 08/18/24  9:30 AM   Specimen: BLOOD  Result Value Ref Range Status   Specimen  Description BLOOD BLOOD RIGHT ARM  Final   Special Requests   Final    Blood Culture adequate volume BOTTLES DRAWN AEROBIC AND ANAEROBIC   Culture   Final    NO GROWTH 5 DAYS Performed at [facility], [address] [place], [place] [address]    Report Status 08/23/2024 FINAL  Final  Blood Culture (routine x 2)     Status: Abnormal   Collection Time: 08/18/24  9:35 AM   Specimen: BLOOD  Result Value Ref Range Status   Specimen Description   Final    BLOOD RIGHT ANTECUBITAL Performed at [facility], [address], [place], [place] [address]    Special Requests   Final    BOTTLES DRAWN AEROBIC AND ANAEROBIC Blood Culture results may not be optimal due to an inadequate volume of blood received in culture bottles Performed at [facility], [address]  [place], [place] [address]    Culture  Setup Time   Final    ANAEROBIC BOTTLE ONLY GRAM POSITIVE COCCI Gram Stain Report Called to,Read Back By and Verified With: D. MISS RN 08/19/24 @0726  BY J. WHITE GRAM STAIN REVIEWED-AGREE WITH RESULT DRT CRITICAL RESULT CALLED TO, READ BACK BY AND VERIFIED WITH: PHARMD STEVEN HURTH ON 08/19/24 @ 1413 BY DRT Performed at [facility], [address] [place], [place] [address]    Culture METHICILLIN RESISTANT STAPHYLOCOCCUS AUREUS (A)  Final   Report Status 08/21/2024 FINAL  Final   Organism ID, Bacteria METHICILLIN RESISTANT STAPHYLOCOCCUS AUREUS  Final      Susceptibility   Methicillin resistant staphylococcus aureus - MIC*    CIPROFLOXACIN  >=8 RESISTANT Resistant     ERYTHROMYCIN >=8 RESISTANT Resistant     GENTAMICIN  <=0.5 SENSITIVE Sensitive     OXACILLIN >=4 RESISTANT Resistant     TETRACYCLINE <=1 SENSITIVE Sensitive     VANCOMYCIN  <=0.5 SENSITIVE Sensitive     TRIMETH/SULFA <=10 SENSITIVE Sensitive     CLINDAMYCIN  >=8 RESISTANT Resistant     RIFAMPIN <=0.5 SENSITIVE Sensitive     Inducible Clindamycin  NEGATIVE Sensitive     LINEZOLID  2 SENSITIVE Sensitive     * METHICILLIN  RESISTANT STAPHYLOCOCCUS AUREUS  Blood Culture ID Panel (Reflexed)     Status: Abnormal   Collection Time: 08/18/24  9:35 AM  Result Value Ref Range Status   Enterococcus faecalis NOT DETECTED NOT DETECTED Final   Enterococcus Faecium NOT DETECTED NOT DETECTED Final   Listeria monocytogenes NOT DETECTED NOT DETECTED Final   Staphylococcus species DETECTED (A) NOT DETECTED Final    Comment: CRITICAL RESULT CALLED TO, READ BACK BY AND VERIFIED WITH: PHARMD STEVEN HURTH ON 08/19/24 @ 1413 BY DRT    Staphylococcus aureus (BCID) DETECTED (A) NOT DETECTED Final    Comment: Methicillin (oxacillin)-resistant Staphylococcus aureus (MRSA). MRSA is predictably resistant to beta-lactam antibiotics (except ceftaroline). Preferred therapy is vancomycin  unless clinically contraindicated. Patient requires contact precautions if  hospitalized. CRITICAL RESULT CALLED TO, READ BACK BY AND VERIFIED WITH: PHARMD STEVEN HURTH ON 08/19/24 @ 1413 BY DRT    Staphylococcus epidermidis NOT DETECTED NOT DETECTED Final   Staphylococcus lugdunensis NOT DETECTED NOT DETECTED Final   Streptococcus species NOT DETECTED NOT DETECTED Final   Streptococcus agalactiae NOT DETECTED NOT DETECTED Final   Streptococcus pneumoniae NOT DETECTED NOT DETECTED Final   Streptococcus pyogenes NOT DETECTED NOT DETECTED Final   A.calcoaceticus-baumannii NOT DETECTED NOT DETECTED Final   Bacteroides fragilis NOT DETECTED NOT DETECTED Final   Enterobacterales NOT DETECTED NOT DETECTED Final   Enterobacter cloacae complex NOT DETECTED NOT DETECTED Final   Escherichia coli NOT DETECTED NOT DETECTED Final   Klebsiella aerogenes NOT DETECTED NOT DETECTED Final   Klebsiella oxytoca NOT DETECTED NOT DETECTED Final   Klebsiella pneumoniae NOT DETECTED NOT DETECTED Final   Proteus species NOT DETECTED NOT DETECTED Final   Salmonella species NOT DETECTED NOT DETECTED Final   Serratia marcescens NOT DETECTED NOT DETECTED Final   Haemophilus  influenzae NOT DETECTED NOT DETECTED Final   Neisseria meningitidis NOT DETECTED NOT DETECTED Final   Pseudomonas aeruginosa NOT DETECTED NOT DETECTED Final   Stenotrophomonas maltophilia NOT DETECTED NOT DETECTED Final   Candida albicans NOT DETECTED NOT DETECTED Final   Candida auris NOT DETECTED NOT DETECTED Final   Candida glabrata NOT DETECTED NOT DETECTED Final   Candida krusei NOT DETECTED NOT DETECTED Final   Candida parapsilosis NOT DETECTED NOT DETECTED  Final   Candida tropicalis NOT DETECTED NOT DETECTED Final   Cryptococcus neoformans/gattii NOT DETECTED NOT DETECTED Final   Meth resistant mecA/C and MREJ DETECTED (A) NOT DETECTED Final    Comment: CRITICAL RESULT CALLED TO, READ BACK BY AND VERIFIED WITH: PHARMD STEVEN HURTH ON 08/19/24 @ 1413 BY DRT Performed at [facility], [address] [place], [place] [address]   MIC (1 Drug)-Blood culture; 08/18/2024; BLOOD; Staph aureus; Daptomycin      Status: None   Collection Time: 08/18/24  9:35 AM   Specimen: BLOOD  Result Value Ref Range Status   Min Inhibitory Conc (1 Drug) Final report  Corrected    Comment: (NOTE) Performed At: [facility] [address] [place], [place] [id number] Jennette Shorter MD Ey:[id number] CORRECTED ON 08/29 AT 1335: PREVIOUSLY REPORTED AS Preliminary report    Source BLOOD CULTURE  Final    Comment: Performed at [facility], [address], [place], [place] [address]  MIC Result     Status: None   Collection Time: 08/18/24  9:35 AM  Result Value Ref Range Status   Result 1 (MIC) Staphylococcus aureus  Final    Comment: (NOTE) Identification performed by account, not confirmed by this laboratory. Testing performed by broth microdilution. DAPTOMYCIN   <= 0.25 ug/mL  SUSCEPTIBLE Performed At: [facility] [address] [place], [place] [id number] Jennette Shorter MD Ey:[id number]   Culture, blood (Routine X 2) w Reflex to ID Panel     Status: None   Collection  Time: 08/19/24 11:43 AM   Specimen: BLOOD  Result Value Ref Range Status   Specimen Description BLOOD BLOOD RIGHT ARM  Final   Special Requests   Final    BOTTLES DRAWN AEROBIC AND ANAEROBIC Blood Culture results may not be optimal due to an inadequate volume of blood received in culture bottles   Culture   Final    NO GROWTH 6 DAYS Performed at [facility], [address] [place], [place] [address]    Report Status 08/25/2024 FINAL  Final  Culture, blood (Routine X 2) w Reflex to ID Panel     Status: None   Collection Time: 08/19/24 11:43 AM   Specimen: BLOOD  Result Value Ref Range Status   Specimen Description BLOOD BLOOD RIGHT HAND  Final   Special Requests   Final    Blood Culture results may not be optimal due to an inadequate volume of blood received in culture bottles BOTTLES DRAWN AEROBIC AND ANAEROBIC   Culture   Final    NO GROWTH 6 DAYS Performed at [facility], [address], [place], [place] [address]    Report Status 08/25/2024 FINAL  Final   Imaging  US  EKG SITE RITE Result Date: 08/23/2024 If Site Rite image not attached, placement could not be confirmed due to current cardiac rhythm.  ECHO TEE Result Date: 08/23/2024    TRANSESOPHOGEAL ECHO REPORT   Patient Name:   [name] Date of Exam: 08/23/2024 Medical Rec #:  [id number]        Height:       69.0 in Accession #:    [id number]       Weight:       222.7 lb Date of Birth:  [birth year]       BSA:          2.162 m Patient Age:    72 years         BP:  144/74 mmHg Patient Gender: M                HR:           79 bpm. Exam Location:  [facility] Procedure: Transesophageal Echo, Cardiac Doppler and Color Doppler (Both            Spectral and Color Flow Doppler were utilized during procedure). Indications:    Bacteremia R78.81  History:        Patient has prior history of Echocardiogram examinations, most                 recent 08/19/2024. CHF, COPD; Risk Factors:Sleep Apnea and                 Dyslipidemia.  Prostate cancer (HCC).  Sonographer:    Aida Pizza RCS Referring Phys: [id number] LORETTE CINDERELLA KAPUR PROCEDURE: The transesophogeal probe was passed without difficulty through the esophogus of the patient. Sedation performed by performing physician. The patient developed no complications during the procedure.  IMPRESSIONS  1. Left ventricular ejection fraction, by estimation, is 60 to 65%. The left ventricle has normal function. The left ventricle has no regional wall motion abnormalities.  2. Right ventricular systolic function is normal. The right ventricular size is normal.  3. No left atrial/left atrial appendage thrombus was detected. The LAA emptying velocity was 60 cm/s.  4. The mitral valve is normal in structure. No evidence of mitral valve regurgitation. No evidence of mitral stenosis.  5. The aortic valve is tricuspid. There is mild calcification of the aortic valve. There is mild thickening of the aortic valve. Aortic valve regurgitation is not visualized. No aortic stenosis is present.  6. Some resistance advancing the probe in the trangastric views, limited views from transgastric position. Conclusion(s)/Recommendation(s): No evidence of vegetation/infective endocarditis on this transesophageael echocardiogram. FINDINGS  Left Ventricle: Left ventricular ejection fraction, by estimation, is 60 to 65%. The left ventricle has normal function. The left ventricle has no regional wall motion abnormalities. The left ventricular internal cavity size was normal in size. Right Ventricle: The right ventricular size is normal. Right vetricular wall thickness was not well visualized. Right ventricular systolic function is normal. Left Atrium: Left atrial size was normal in size. No left atrial/left atrial appendage thrombus was detected. The LAA emptying velocity was 60 cm/s. Right Atrium: Right atrial size was normal in size. Pericardium: There is no evidence of pericardial effusion. Mitral Valve: The mitral valve  is normal in structure. No evidence of mitral valve regurgitation. No evidence of mitral valve stenosis. Tricuspid Valve: The tricuspid valve is normal in structure. Tricuspid valve regurgitation is not demonstrated. No evidence of tricuspid stenosis. Aortic Valve: The aortic valve is tricuspid. There is mild calcification of the aortic valve. There is mild thickening of the aortic valve. Aortic valve regurgitation is not visualized. No aortic stenosis is present. Pulmonic Valve: The pulmonic valve was normal in structure. Pulmonic valve regurgitation is not visualized. No evidence of pulmonic stenosis. Aorta: The aortic root is normal in size and structure. IAS/Shunts: No atrial level shunt detected by color flow Doppler.   AORTA Ao Root diam: 3.50 cm Dorn Ross MD Electronically signed by Dorn Ross MD Signature Date/Time: 08/23/2024/2:34:09 PM    Final    ECHOCARDIOGRAM COMPLETE Result Date: 08/19/2024    ECHOCARDIOGRAM REPORT   Patient Name:   [name] Date of Exam: 08/19/2024 Medical Rec #:  [id number]        Height:  69.0 in Accession #:    [id number]       Weight:       229.3 lb Date of Birth:  03-27-1951       BSA:          2.190 m Patient Age:    72 years         BP:           141/71 mmHg Patient Gender: M                HR:           73 bpm. [facility]am Location:  [facility] Procedure: 2D Echo, Cardiac Doppler, Color Doppler and Intracardiac            Opacification Agent (Both Spectral and Color Flow Doppler were            utilized during procedure). Indications:    Bacteremia  History:        Patient has prior history of Echocardiogram examinations.                 Signs/Symptoms:Bacteremia.  Sonographer:    Vella Key Referring Phys: [phone] SEYED A SHAHMEHDI  Sonographer Comments: Technically difficult study due to poor echo windows. IMPRESSIONS  1. Poor Echo windows for evaluation of infective endocarditis. Valves not well visualized. Consider TEE if clinically indicated.  2. No LV  thrombus by Definity . Left ventricular ejection fraction, by estimation, is 70 to 75%. The left ventricle has hyperdynamic function. The left ventricle has no regional wall motion abnormalities. Left ventricular diastolic parameters are consistent with Grade I diastolic dysfunction (impaired relaxation).  3. Right ventricular systolic function was not well visualized. The right ventricular size is normal. Tricuspid regurgitation signal is inadequate for assessing PA pressure.  4. The mitral valve was not well visualized. No evidence of mitral valve regurgitation. No evidence of mitral stenosis.  5. The aortic valve was not well visualized. Aortic valve regurgitation is not visualized. No aortic stenosis is present. FINDINGS  Left Ventricle: No LV thrombus by Definity . Left ventricular ejection fraction, by estimation, is 70 to 75%. The left ventricle has hyperdynamic function. The left ventricle has no regional wall motion abnormalities. Definity  contrast agent was given IV  to delineate the left ventricular endocardial borders. Strain was performed and the global longitudinal strain is indeterminate. The left ventricular internal cavity size was normal in size. There is no left ventricular hypertrophy. Left ventricular diastolic parameters are consistent with Grade I diastolic dysfunction (impaired relaxation). Normal left ventricular filling pressure. Right Ventricle: The right ventricular size is normal. No increase in right ventricular wall thickness. Right ventricular systolic function was not well visualized. Tricuspid regurgitation signal is inadequate for assessing PA pressure. Left Atrium: Left atrial size was normal in size. Right Atrium: Right atrial size was normal in size. Pericardium: There is no evidence of pericardial effusion. Mitral Valve: The mitral valve was not well visualized. No evidence of mitral valve regurgitation. No evidence of mitral valve stenosis. Tricuspid Valve: The tricuspid valve  is not well visualized. Tricuspid valve regurgitation is not demonstrated. No evidence of tricuspid stenosis. Aortic Valve: The aortic valve was not well visualized. Aortic valve regurgitation is not visualized. No aortic stenosis is present. Pulmonic Valve: The pulmonic valve was not well visualized. Pulmonic valve regurgitation is not visualized. No evidence of pulmonic stenosis. Aorta: The aortic root is normal in size and structure. Venous: The inferior vena cava was not well visualized. IAS/Shunts: No atrial  level shunt detected by color flow Doppler. Additional Comments: 3D was performed not requiring image post processing on an independent workstation and was indeterminate.   LV Volumes (MOD) LV vol d, MOD A2C: 48.6 ml Diastology LV vol d, MOD A4C: 36.0 ml LV e' medial:    8.27 cm/s LV vol s, MOD A2C: 8.1 ml  LV E/e' medial:  8.1 LV vol s, MOD A4C: 6.0 ml  LV e' lateral:   6.09 cm/s LV SV MOD A2C:     40.5 ml LV E/e' lateral: 11.0 LV SV MOD A4C:     36.0 ml LV SV MOD BP:      37.0 ml RIGHT VENTRICLE RV Basal diam:  3.20 cm RV S prime:     11.60 cm/s TAPSE (M-mode): 3.6 cm LEFT ATRIUM           Index        RIGHT ATRIUM           Index LA Vol (A2C): 53.0 ml 24.21 ml/m  RA Area:     18.30 cm LA Vol (A4C): 62.5 ml 28.55 ml/m  RA Volume:   53.80 ml  24.57 ml/m  AORTIC VALVE LVOT Vmax:   75.00 cm/s LVOT Vmean:  55.100 cm/s LVOT VTI:    0.176 m MITRAL VALVE MV Area (PHT): 4.83 cm    SHUNTS MV Decel Time: 157 msec    Systemic VTI: 0.18 m MV E velocity: 66.90 cm/s MV A velocity: 76.80 cm/s MV E/A ratio:  0.87 Vishnu Priya Mallipeddi Electronically signed by Diannah Late Mallipeddi Signature Date/Time: 08/19/2024/4:45:53 PM    Final    US  ARTERIAL ABI (SCREENING LOWER EXTREMITY) Result Date: 08/19/2024 CLINICAL DATA:  Edema. EXAM: NONINVASIVE PHYSIOLOGIC VASCULAR STUDY OF BILATERAL LOWER EXTREMITIES TECHNIQUE: Evaluation of both lower extremities were performed at rest, including calculation of ankle-brachial  indices with single level pressure measurements and doppler recording. COMPARISON:  None available. FINDINGS: Right ABI:  1.03 Left ABI:  1.14 Right Lower Extremity:  Normal arterial waveforms at the ankle. Left Lower Extremity:  Normal arterial waveforms at the ankle. 1.0-1.4 Normal IMPRESSION: No evidence of significant lower extremity peripheral arterial disease Electronically Signed   By: Aliene Lloyd M.D.   On: 08/19/2024 12:58   US  Venous Img Lower Bilateral Addendum Date: 08/18/2024 ADDENDUM REPORT: 08/18/2024 11:38 ADDENDUM: Critical Value/emergent results were called by telephone at the time of interpretation by Dr Duwaine Severs on August 18, 2024 to provider Dr. Freddi who verbally acknowledged these results. Electronically Signed   By: Megan  Zare M.D.   On: 08/18/2024 11:38   Result Date: 08/18/2024 CLINICAL DATA:  Chronic lower extremity edema EXAM: Bilateral LOWER EXTREMITY VENOUS DOPPLER ULTRASOUND TECHNIQUE: Gray-scale sonography with compression, as well as color and duplex ultrasound, were performed to evaluate the deep venous system(s) from the level of the common femoral vein through the popliteal and proximal calf veins. COMPARISON:  July 26, 2024 FINDINGS: VENOUS There is a partially occlusive thrombus within the normal-sized left popliteal vein with lack of complete compressibility. Otherwise there is normal compressibility of the common femoral, superficial femoral, and popliteal veins, as well as the remainder of the visualized calf veins. Visualized portions of profunda femoral vein and great saphenous vein unremarkable. No filling defects to suggest DVT on grayscale or color Doppler imaging within the remainder of the vessels. Doppler waveforms show normal direction of venous flow, normal respiratory plasticity and response to augmentation. OTHER Bilateral lower extremity soft tissue edema distal to the  knee. Limitations: none IMPRESSION: Partially occlusive left popliteal vein  thrombus with lack of complete compressibility. No DVT on the right. Soft tissue edema in bilateral lower extremity distal to the knee. Electronically Signed: By: Megan  Zare M.D. On: 08/18/2024 11:32   DG Chest Port 1 View Result Date: 08/18/2024 CLINICAL DATA:  Fever. EXAM: PORTABLE CHEST 1 VIEW COMPARISON:  December 26, 2023. FINDINGS: Stable cardiomediastinal silhouette. Stable elevated left hemidiaphragm. No definite acute pulmonary disease is noted. Bony thorax is unremarkable. IMPRESSION: No active disease. Electronically Signed   By: Lynwood Landy Raddle M.D.   On: 08/18/2024 09:36   DG Tibia/Fibula Right Result Date: 08/18/2024 CLINICAL DATA:  Right lower extremity pain and swelling for several weeks. EXAM: RIGHT TIBIA AND FIBULA - 2 VIEW COMPARISON:  None Available. FINDINGS: There is no evidence of fracture or other focal bone lesions. Soft tissues are unremarkable. IMPRESSION: Negative. Electronically Signed   By: Lynwood Landy Raddle M.D.   On: 08/18/2024 09:34   Assessment/Plan # Uncomplicated MRSA bacteremia 2/2  # RT leg cellulitis, resolved - s/p completion of daptomycin  as planned ( Daptomycin  S to MRSA) - 9/2 CBC, CMP, CRP and CPK reviewed in careeverywhere and unremarkable  - no need for further antibiotics   # Chronic lower extremity edema # DVT of Left LE # Possible venous insufficiency - on chronic AC - will get Venous reflux of lower extremities - Keep legs elevated  - fu with Cardiology for CHF management - fu in 2 weeks to review venous studies   # RA  - not on any immunosuppressives, no plans to start which I agree.   - Fu with Rheumatology, last seen 9/4.   I spent 60 minutes involved in face-to-face and non-face-to-face activities for this patient on the day of the visit. Professional time spent includes the following activities: Preparing to see the patient (review of tests), Obtaining and reviewing separately obtained history (discharge record 8/25, Rheumatology 9/4),  Performing a medically appropriate examination and evaluation, Ordering imaging, referring and communicating with other health care professionals, Documenting clinical information in the EMR, Independently interpreting results (not separately reported), Communicating results to the patient/family member, Counseling and educating the patient/family member and Care coordination (not separately reported).   Of note, portions of this note may have been created with voice recognition software. While this note has been edited for accuracy, occasional wrong-word or 'sound-a-like' substitutions may have occurred due to the inherent limitations of voice recognition software.   Annalee Joseph, MD [facility] 09/09/2024, 2:46 PM

## 2024-09-10 DIAGNOSIS — N179 Acute kidney failure, unspecified: Secondary | ICD-10-CM | POA: Diagnosis not present

## 2024-09-10 DIAGNOSIS — I872 Venous insufficiency (chronic) (peripheral): Secondary | ICD-10-CM | POA: Diagnosis not present

## 2024-09-10 DIAGNOSIS — I5032 Chronic diastolic (congestive) heart failure: Secondary | ICD-10-CM | POA: Diagnosis not present

## 2024-09-10 DIAGNOSIS — I130 Hypertensive heart and chronic kidney disease with heart failure and stage 1 through stage 4 chronic kidney disease, or unspecified chronic kidney disease: Secondary | ICD-10-CM | POA: Diagnosis not present

## 2024-09-10 DIAGNOSIS — L03115 Cellulitis of right lower limb: Secondary | ICD-10-CM | POA: Diagnosis not present

## 2024-09-10 DIAGNOSIS — L97811 Non-pressure chronic ulcer of other part of right lower leg limited to breakdown of skin: Secondary | ICD-10-CM | POA: Diagnosis not present

## 2024-09-13 ENCOUNTER — Ambulatory Visit (HOSPITAL_COMMUNITY)
Admission: RE | Admit: 2024-09-13 | Discharge: 2024-09-13 | Disposition: A | Discharge status: Home / Self Care | Source: Ambulatory Visit | Attending: Infectious Diseases | Admitting: Infectious Diseases

## 2024-09-13 DIAGNOSIS — R600 Localized edema: Secondary | ICD-10-CM | POA: Diagnosis not present

## 2024-09-13 DIAGNOSIS — Z452 Encounter for adjustment and management of vascular access device: Secondary | ICD-10-CM | POA: Insufficient documentation

## 2024-09-14 ENCOUNTER — Encounter (HOSPITAL_COMMUNITY)

## 2024-09-14 DIAGNOSIS — L97811 Non-pressure chronic ulcer of other part of right lower leg limited to breakdown of skin: Secondary | ICD-10-CM | POA: Diagnosis not present

## 2024-09-14 DIAGNOSIS — I5032 Chronic diastolic (congestive) heart failure: Secondary | ICD-10-CM | POA: Diagnosis not present

## 2024-09-14 DIAGNOSIS — I872 Venous insufficiency (chronic) (peripheral): Secondary | ICD-10-CM | POA: Diagnosis not present

## 2024-09-14 DIAGNOSIS — I130 Hypertensive heart and chronic kidney disease with heart failure and stage 1 through stage 4 chronic kidney disease, or unspecified chronic kidney disease: Secondary | ICD-10-CM | POA: Diagnosis not present

## 2024-09-14 DIAGNOSIS — N179 Acute kidney failure, unspecified: Secondary | ICD-10-CM | POA: Diagnosis not present

## 2024-09-14 DIAGNOSIS — L03115 Cellulitis of right lower limb: Secondary | ICD-10-CM | POA: Diagnosis not present

## 2024-09-16 DIAGNOSIS — I5032 Chronic diastolic (congestive) heart failure: Secondary | ICD-10-CM | POA: Diagnosis not present

## 2024-09-16 DIAGNOSIS — I872 Venous insufficiency (chronic) (peripheral): Secondary | ICD-10-CM | POA: Diagnosis not present

## 2024-09-16 DIAGNOSIS — L03115 Cellulitis of right lower limb: Secondary | ICD-10-CM | POA: Diagnosis not present

## 2024-09-16 DIAGNOSIS — I130 Hypertensive heart and chronic kidney disease with heart failure and stage 1 through stage 4 chronic kidney disease, or unspecified chronic kidney disease: Secondary | ICD-10-CM | POA: Diagnosis not present

## 2024-09-16 DIAGNOSIS — L97811 Non-pressure chronic ulcer of other part of right lower leg limited to breakdown of skin: Secondary | ICD-10-CM | POA: Diagnosis not present

## 2024-09-16 DIAGNOSIS — N179 Acute kidney failure, unspecified: Secondary | ICD-10-CM | POA: Diagnosis not present

## 2024-09-20 DIAGNOSIS — L03115 Cellulitis of right lower limb: Secondary | ICD-10-CM | POA: Diagnosis not present

## 2024-09-20 DIAGNOSIS — I130 Hypertensive heart and chronic kidney disease with heart failure and stage 1 through stage 4 chronic kidney disease, or unspecified chronic kidney disease: Secondary | ICD-10-CM | POA: Diagnosis not present

## 2024-09-20 DIAGNOSIS — N179 Acute kidney failure, unspecified: Secondary | ICD-10-CM | POA: Diagnosis not present

## 2024-09-20 DIAGNOSIS — I872 Venous insufficiency (chronic) (peripheral): Secondary | ICD-10-CM | POA: Diagnosis not present

## 2024-09-20 DIAGNOSIS — L97811 Non-pressure chronic ulcer of other part of right lower leg limited to breakdown of skin: Secondary | ICD-10-CM | POA: Diagnosis not present

## 2024-09-20 DIAGNOSIS — I5032 Chronic diastolic (congestive) heart failure: Secondary | ICD-10-CM | POA: Diagnosis not present

## 2024-09-21 ENCOUNTER — Telehealth: Payer: Self-pay

## 2024-09-21 ENCOUNTER — Other Ambulatory Visit: Payer: Self-pay | Admitting: Infectious Diseases

## 2024-09-21 DIAGNOSIS — I872 Venous insufficiency (chronic) (peripheral): Secondary | ICD-10-CM

## 2024-09-21 NOTE — Telephone Encounter (Signed)
 Received call from [facility] nurse with patient concerned regarding ABI results. RN just following up if provider can review and give patient updates. Routing to provider.  Enis Kleine, LPN

## 2024-09-21 NOTE — Telephone Encounter (Signed)
 Patient made aware of abnormal results and referral.

## 2024-09-23 DIAGNOSIS — I5032 Chronic diastolic (congestive) heart failure: Secondary | ICD-10-CM | POA: Diagnosis not present

## 2024-09-23 DIAGNOSIS — K219 Gastro-esophageal reflux disease without esophagitis: Secondary | ICD-10-CM | POA: Diagnosis not present

## 2024-09-23 DIAGNOSIS — M109 Gout, unspecified: Secondary | ICD-10-CM | POA: Diagnosis not present

## 2024-09-23 DIAGNOSIS — M5030 Other cervical disc degeneration, unspecified cervical region: Secondary | ICD-10-CM | POA: Diagnosis not present

## 2024-09-23 DIAGNOSIS — D696 Thrombocytopenia, unspecified: Secondary | ICD-10-CM | POA: Diagnosis not present

## 2024-09-23 DIAGNOSIS — L03115 Cellulitis of right lower limb: Secondary | ICD-10-CM | POA: Diagnosis not present

## 2024-09-23 DIAGNOSIS — N179 Acute kidney failure, unspecified: Secondary | ICD-10-CM | POA: Diagnosis not present

## 2024-09-23 DIAGNOSIS — G40909 Epilepsy, unspecified, not intractable, without status epilepticus: Secondary | ICD-10-CM | POA: Diagnosis not present

## 2024-09-23 DIAGNOSIS — J441 Chronic obstructive pulmonary disease with (acute) exacerbation: Secondary | ICD-10-CM | POA: Diagnosis not present

## 2024-09-23 DIAGNOSIS — M069 Rheumatoid arthritis, unspecified: Secondary | ICD-10-CM | POA: Diagnosis not present

## 2024-09-23 DIAGNOSIS — L97811 Non-pressure chronic ulcer of other part of right lower leg limited to breakdown of skin: Secondary | ICD-10-CM | POA: Diagnosis not present

## 2024-09-23 DIAGNOSIS — E876 Hypokalemia: Secondary | ICD-10-CM | POA: Diagnosis not present

## 2024-09-23 DIAGNOSIS — Z6832 Body mass index (BMI) 32.0-32.9, adult: Secondary | ICD-10-CM | POA: Diagnosis not present

## 2024-09-23 DIAGNOSIS — G4733 Obstructive sleep apnea (adult) (pediatric): Secondary | ICD-10-CM | POA: Diagnosis not present

## 2024-09-23 DIAGNOSIS — N1831 Chronic kidney disease, stage 3a: Secondary | ICD-10-CM | POA: Diagnosis not present

## 2024-09-23 DIAGNOSIS — I130 Hypertensive heart and chronic kidney disease with heart failure and stage 1 through stage 4 chronic kidney disease, or unspecified chronic kidney disease: Secondary | ICD-10-CM | POA: Diagnosis not present

## 2024-09-23 DIAGNOSIS — M19072 Primary osteoarthritis, left ankle and foot: Secondary | ICD-10-CM | POA: Diagnosis not present

## 2024-09-23 DIAGNOSIS — I82502 Chronic embolism and thrombosis of unspecified deep veins of left lower extremity: Secondary | ICD-10-CM | POA: Diagnosis not present

## 2024-09-23 DIAGNOSIS — M19071 Primary osteoarthritis, right ankle and foot: Secondary | ICD-10-CM | POA: Diagnosis not present

## 2024-09-23 DIAGNOSIS — E7800 Pure hypercholesterolemia, unspecified: Secondary | ICD-10-CM | POA: Diagnosis not present

## 2024-09-23 DIAGNOSIS — I361 Nonrheumatic tricuspid (valve) insufficiency: Secondary | ICD-10-CM | POA: Diagnosis not present

## 2024-09-23 DIAGNOSIS — K5790 Diverticulosis of intestine, part unspecified, without perforation or abscess without bleeding: Secondary | ICD-10-CM | POA: Diagnosis not present

## 2024-09-23 DIAGNOSIS — I872 Venous insufficiency (chronic) (peripheral): Secondary | ICD-10-CM | POA: Diagnosis not present

## 2024-09-23 DIAGNOSIS — E66812 Obesity, class 2: Secondary | ICD-10-CM | POA: Diagnosis not present

## 2024-09-23 DIAGNOSIS — K589 Irritable bowel syndrome without diarrhea: Secondary | ICD-10-CM | POA: Diagnosis not present

## 2024-09-24 DIAGNOSIS — L03115 Cellulitis of right lower limb: Secondary | ICD-10-CM | POA: Diagnosis not present

## 2024-09-24 DIAGNOSIS — I5032 Chronic diastolic (congestive) heart failure: Secondary | ICD-10-CM | POA: Diagnosis not present

## 2024-09-24 DIAGNOSIS — I130 Hypertensive heart and chronic kidney disease with heart failure and stage 1 through stage 4 chronic kidney disease, or unspecified chronic kidney disease: Secondary | ICD-10-CM | POA: Diagnosis not present

## 2024-09-24 DIAGNOSIS — L97811 Non-pressure chronic ulcer of other part of right lower leg limited to breakdown of skin: Secondary | ICD-10-CM | POA: Diagnosis not present

## 2024-09-24 DIAGNOSIS — I872 Venous insufficiency (chronic) (peripheral): Secondary | ICD-10-CM | POA: Diagnosis not present

## 2024-09-24 DIAGNOSIS — N179 Acute kidney failure, unspecified: Secondary | ICD-10-CM | POA: Diagnosis not present

## 2024-09-27 ENCOUNTER — Ambulatory Visit: Payer: Self-pay | Admitting: Infectious Diseases

## 2024-09-27 DIAGNOSIS — I872 Venous insufficiency (chronic) (peripheral): Secondary | ICD-10-CM | POA: Diagnosis not present

## 2024-09-27 DIAGNOSIS — L97811 Non-pressure chronic ulcer of other part of right lower leg limited to breakdown of skin: Secondary | ICD-10-CM | POA: Diagnosis not present

## 2024-09-27 DIAGNOSIS — L03115 Cellulitis of right lower limb: Secondary | ICD-10-CM | POA: Diagnosis not present

## 2024-09-27 DIAGNOSIS — R791 Abnormal coagulation profile: Secondary | ICD-10-CM | POA: Diagnosis not present

## 2024-09-27 DIAGNOSIS — I130 Hypertensive heart and chronic kidney disease with heart failure and stage 1 through stage 4 chronic kidney disease, or unspecified chronic kidney disease: Secondary | ICD-10-CM | POA: Diagnosis not present

## 2024-09-27 DIAGNOSIS — N179 Acute kidney failure, unspecified: Secondary | ICD-10-CM | POA: Diagnosis not present

## 2024-09-27 DIAGNOSIS — I5032 Chronic diastolic (congestive) heart failure: Secondary | ICD-10-CM | POA: Diagnosis not present

## 2024-09-28 ENCOUNTER — Other Ambulatory Visit

## 2024-09-28 DIAGNOSIS — C61 Malignant neoplasm of prostate: Secondary | ICD-10-CM | POA: Diagnosis not present

## 2024-09-29 LAB — PSA: Prostate Specific Ag, Serum: <0.1 ng/mL (ref 0.0–4.0)

## 2024-09-30 ENCOUNTER — Emergency Department (HOSPITAL_COMMUNITY)

## 2024-09-30 ENCOUNTER — Telehealth: Payer: Self-pay | Admitting: *Deleted

## 2024-09-30 ENCOUNTER — Other Ambulatory Visit: Payer: Self-pay

## 2024-09-30 ENCOUNTER — Encounter (HOSPITAL_COMMUNITY): Payer: Self-pay | Admitting: Emergency Medicine

## 2024-09-30 ENCOUNTER — Emergency Department (HOSPITAL_COMMUNITY)
Admission: EM | Admit: 2024-09-30 | Discharge: 2024-09-30 | Disposition: A | Discharge status: Home / Self Care | Attending: Emergency Medicine | Admitting: Emergency Medicine

## 2024-09-30 DIAGNOSIS — S199XXA Unspecified injury of neck, initial encounter: Secondary | ICD-10-CM | POA: Diagnosis not present

## 2024-09-30 DIAGNOSIS — J449 Chronic obstructive pulmonary disease, unspecified: Secondary | ICD-10-CM | POA: Diagnosis not present

## 2024-09-30 DIAGNOSIS — Z043 Encounter for examination and observation following other accident: Secondary | ICD-10-CM | POA: Diagnosis not present

## 2024-09-30 DIAGNOSIS — M542 Cervicalgia: Secondary | ICD-10-CM | POA: Diagnosis not present

## 2024-09-30 DIAGNOSIS — R109 Unspecified abdominal pain: Secondary | ICD-10-CM | POA: Diagnosis not present

## 2024-09-30 DIAGNOSIS — N1831 Chronic kidney disease, stage 3a: Secondary | ICD-10-CM | POA: Diagnosis not present

## 2024-09-30 DIAGNOSIS — M47816 Spondylosis without myelopathy or radiculopathy, lumbar region: Secondary | ICD-10-CM | POA: Diagnosis not present

## 2024-09-30 DIAGNOSIS — R030 Elevated blood-pressure reading, without diagnosis of hypertension: Secondary | ICD-10-CM | POA: Diagnosis not present

## 2024-09-30 DIAGNOSIS — M47812 Spondylosis without myelopathy or radiculopathy, cervical region: Secondary | ICD-10-CM | POA: Diagnosis not present

## 2024-09-30 DIAGNOSIS — Z8546 Personal history of malignant neoplasm of prostate: Secondary | ICD-10-CM | POA: Diagnosis not present

## 2024-09-30 DIAGNOSIS — S3992XA Unspecified injury of lower back, initial encounter: Secondary | ICD-10-CM | POA: Diagnosis not present

## 2024-09-30 DIAGNOSIS — Z79899 Other long term (current) drug therapy: Secondary | ICD-10-CM | POA: Diagnosis not present

## 2024-09-30 DIAGNOSIS — S12301A Unspecified nondisplaced fracture of fourth cervical vertebra, initial encounter for closed fracture: Secondary | ICD-10-CM | POA: Insufficient documentation

## 2024-09-30 DIAGNOSIS — Z7901 Long term (current) use of anticoagulants: Secondary | ICD-10-CM | POA: Diagnosis not present

## 2024-09-30 DIAGNOSIS — I700 Atherosclerosis of aorta: Secondary | ICD-10-CM | POA: Diagnosis not present

## 2024-09-30 DIAGNOSIS — Z87891 Personal history of nicotine dependence: Secondary | ICD-10-CM | POA: Diagnosis not present

## 2024-09-30 DIAGNOSIS — R0781 Pleurodynia: Secondary | ICD-10-CM | POA: Diagnosis not present

## 2024-09-30 DIAGNOSIS — W19XXXA Unspecified fall, initial encounter: Secondary | ICD-10-CM

## 2024-09-30 DIAGNOSIS — S0990XA Unspecified injury of head, initial encounter: Secondary | ICD-10-CM | POA: Insufficient documentation

## 2024-09-30 DIAGNOSIS — W2201XA Walked into wall, initial encounter: Secondary | ICD-10-CM | POA: Diagnosis not present

## 2024-09-30 DIAGNOSIS — I5032 Chronic diastolic (congestive) heart failure: Secondary | ICD-10-CM | POA: Insufficient documentation

## 2024-09-30 DIAGNOSIS — I6782 Cerebral ischemia: Secondary | ICD-10-CM | POA: Diagnosis not present

## 2024-09-30 DIAGNOSIS — M5134 Other intervertebral disc degeneration, thoracic region: Secondary | ICD-10-CM | POA: Diagnosis not present

## 2024-09-30 MED ORDER — OXYCODONE-ACETAMINOPHEN 5-325 MG PO TABS: 1.0000 | ORAL_TABLET | Freq: Four times a day (QID) | ORAL | 0 refills | Status: DC | PRN

## 2024-09-30 MED ORDER — HYDROCODONE-ACETAMINOPHEN 5-325 MG PO TABS
1.0000 | ORAL_TABLET | Freq: Once | ORAL | Status: AC
  Administered 2024-09-30: 1 via ORAL
  Filled 2024-09-30: qty 1

## 2024-09-30 MED ORDER — LIDOCAINE 5 % EX PTCH: 1.0000 | MEDICATED_PATCH | Freq: Every day | CUTANEOUS | 0 refills | Status: AC | PRN

## 2024-09-30 NOTE — ED Provider Notes (Signed)
 [place] EMERGENCY DEPARTMENT AT [facility] Provider Note  CSN: [id number] Arrival date & time: 09/30/24 1723  Chief Complaint(s) Fall  HPI [name] is a 73 y.o. male with past medical history as below, significant for asthma, VTE on warfarin, COPD, gout, prostate cancer who presents to the ED with complaint of fall  Reports prior to arrival he was carrying a table, he said the table down and used a table down he lost his balance and fell backwards.  He fell into a wall there was a few steps behind him.  Experienced pain to posterior aspect of his neck and his lumbar region.  He was ambulatory after the fall.  No LOC.  He is on warfarin.  He has no abdominal pain or chest pain, no nausea or vomiting.  No blood in stool or blood in urine.  He has no significant headache.  No numbness or weakness of extremities.    Past Medical History Past Medical History:  Diagnosis Date   Arthritis    RA   Asthma    CHF (congestive heart failure) (HCC)    pt denies    Clotting disorder    DVT both legs    COPD (chronic obstructive pulmonary disease) (HCC)    DDD (degenerative disc disease), cervical    with UE's paresthesias   Diverticulosis    DVT, lower extremity (HCC)    bilat   Eye abnormality    right eye drifts has difficulty focusing 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 Unknown origin.   Shortness of breath dyspnea    exertion    Sleep apnea    cpap    Tubular adenoma of colon 04/2008   Patient Active Problem List   Diagnosis Date Noted   Lower extremity edema 09/13/2024   PICC (peripherally inserted central  catheter) removal 09/13/2024   Cellulitis of right lower extremity 09/09/2024   Medication management 09/09/2024   MRSA bacteremia 08/20/2024   MRSA infection 08/20/2024   Sepsis (HCC) 08/18/2024   Acute respiratory failure with hypoxia (HCC) 01/23/2023   Stage 3a chronic kidney disease (CKD) (HCC) 03/11/2022   Bronchiectasis without complication (HCC) 03/26/2021   Incontinence when straining, male 03/20/2021   Erectile dysfunction after radical prostatectomy 09/08/2020   OAB (overactive bladder) 02/23/2020   Class 2 severe obesity due to excess calories with serious comorbidity and body mass index (BMI) of 35.0 to 35.9 in adult    Chronic diastolic HF (heart failure) (HCC)    OSA (obstructive sleep apnea) 03/10/2019   Dyspnea on exertion 10/22/2017   History of seizure disorder 02/27/2017   Primary osteoarthritis of both hands 02/27/2017   Primary osteoarthritis of both feet 02/27/2017   Idiopathic gout of multiple sites 02/27/2017   History of prostate cancer 12/29/2016   Primary osteoarthritis of both knees 12/29/2016   Chronic idiopathic gout involving toe without tophus 12/29/2016   History of COPD 12/29/2016   Plantar pustular psoriasis 12/29/2016   DVT (deep venous thrombosis) (HCC) 10/31/2016  COPD with acute exacerbation (HCC) 10/31/2016   CHF (congestive heart failure) (HCC) 10/31/2016   Prostate cancer (HCC) 08/30/2015   Malignant neoplasm of prostate (HCC) 07/04/2015   Chest pain at rest 07/05/2014   Weakness 07/05/2014   Complicated postphlebitic syndrome 11/16/2013   Personal history of DVT (deep vein thrombosis) 05/18/2013   Chronic anticoagulation 05/18/2013   Diverticulosis of colon without hemorrhage 05/18/2013   Rheumatoid arthritis (HCC) 05/18/2013   Seizure disorder (HCC) 05/18/2013   History of colonic polyps 05/18/2013   GERD 05/08/2010   Home Medication(s) Prior to Admission medications   Medication Sig Start Date End Date Taking? Authorizing  Provider  allopurinol  (ZYLOPRIM ) 100 MG tablet Take 1 tablet (100 mg total) by mouth 2 (two) times daily. 02/12/23   Cheryl Waddell HERO, PA-C  amLODipine  (NORVASC ) 10 MG tablet Take 1 tablet (10 mg total) by mouth daily. 08/20/24   Shahmehdi, Seyed A, MD  ascorbic acid (VITAMIN C) 500 MG tablet Take 500 mg by mouth at bedtime.    [provider]  Budeson-Glycopyrrol-Formoterol  (BREZTRI  AEROSPHERE) 160-9-4.8 MCG/ACT AERO Inhale 2 puffs into the lungs in the morning. 02/06/24   Geronimo Amel, MD  cholecalciferol (VITAMIN D3) 25 MCG (1000 UNIT) tablet Take 1,000 Units by mouth at bedtime.    [provider]  dicyclomine  (BENTYL ) 10 MG capsule TAKE 1 CAPSULE BY MOUTH 4 TIMES DAILY BEFORE MEALS AND AT BEDTIME 08/11/17   Aneita Gwendlyn DASEN, MD  furosemide  (LASIX ) 80 MG tablet TAKE 1 TABLET BY MOUTH EVERY DAY 08/19/22   Croitoru, Mihai, MD  leflunomide  (ARAVA ) 20 MG tablet TAKE 1 TABLET BY MOUTH EVERY DAY 03/15/24   Dale, Taylor M, PA-C  mupirocin  ointment (BACTROBAN ) 2 % Apply topically 3 (three) times daily. 08/23/24   Shahmehdi, Adriana LABOR, MD  omeprazole  (PRILOSEC) 40 MG capsule Take 1 capsule (40 mg total) by mouth daily. 01/25/23   Pearlean Manus, MD  oxybutynin  (DITROPAN -XL) 10 MG 24 hr tablet Take 1 tablet (10 mg total) by mouth daily. 03/15/22   McKenzie, Belvie CROME, MD  phenytoin  (DILANTIN ) 100 MG ER capsule Take 100-200 mg by mouth 2 (two) times daily. Take 1 capsule in the morning and 2 capsules at bedtime    [provider]  Potassium Chloride  ER 20 MEQ TBCR Take 20 mEq by mouth daily. 02/24/19   [provider]  rosuvastatin  (CRESTOR ) 10 MG tablet Take 10 mg by mouth at bedtime. 06/14/20   [provider]  spironolactone  (ALDACTONE ) 50 MG tablet Take 1 tablet (50 mg total) by mouth daily. 08/24/24 11/22/24  Willette Adriana LABOR, MD  Tocilizumab  (ACTEMRA  IV) Inject into the vein every 28 (twenty-eight) days.    [provider]  warfarin (COUMADIN ) 4 MG tablet  Take 4 mg by mouth at bedtime.    [provider]  Past Surgical History Past Surgical History:  Procedure Laterality Date   CHOLECYSTECTOMY     CIRCUMCISION     COLONOSCOPY     CYSTOSCOPY WITH LITHOLAPAXY N/A 03/17/2019   Procedure: CYSTOSCOPY WITH LITHOLAPAXY AND REMOVAL OF FOREIGN BODY;  Surgeon: Sherrilee Belvie CROME, MD;  Location: [facility];  Service: Urology;  Laterality: N/A;   DOPPLER ECHOCARDIOGRAPHY N/A 01-21-2012   TECHNICALLY DIFFICULT. MILD CONCENTRIC LV HYPERTROPHY. LV CAVITY IS SMALL.. EF=> 55%. TRANSMITRAL SPECTRAL FLOW PATTREN IS SUGGESTIVE OF IMPAIRED LV RELAXATION. RV SYSTOLIC PRESSURE IS . LEFT ATRIAL SIZE IS NORMAL. AV APPEARS MILDLY SCLEROTIC. NO SIGN VALVE DISEASE NOTED.   LEFT HEART CATH AND CORONARY ANGIOGRAPHY N/A 03/12/2022   Procedure: LEFT HEART CATH AND CORONARY ANGIOGRAPHY;  Surgeon: Wonda Sharper, MD;  Location: [facility] INVASIVE CV LAB;  Service: Cardiovascular;  Laterality: N/A;   LYMPHADENECTOMY Bilateral 08/30/2015   Procedure: PELVIC LYMPHADENECTOMY;  Surgeon: Belvie CROME Sherrilee, MD;  Location: [facility];  Service: Urology;  Laterality: Bilateral;   NUCLEAR STRESS TEST N/A 01-21-2012   NORMAL PATTERN OF PERFUSION IN ALL REGIONS. POST STRESS LV SIZE IS NORMAL. NO EVIDENCE OF INDUCIBLE ISCHEMIA. EF 54%.   POLYPECTOMY     PROSTATE BIOPSY     ROBOT ASSISTED LAPAROSCOPIC RADICAL PROSTATECTOMY N/A 08/30/2015   Procedure: ROBOTIC ASSISTED LAPAROSCOPIC RADICAL PROSTATECTOMY;  Surgeon: Belvie CROME Sherrilee, MD;  Location: [facility];  Service: Urology;  Laterality: N/A;   TEE WITHOUT CARDIOVERSION N/A 08/23/2024   Procedure: ECHOCARDIOGRAM, TRANSESOPHAGEAL;  Surgeon: Alvan Dorn FALCON, MD;  Location: [facility];  Service: Endoscopy;  Laterality: N/A;   URINARY SPHINCTER IMPLANT N/A 03/20/2021   Procedure: ARTIFICIAL URINARY SPHINCTER CYSTOSCOPY;   Surgeon: Gaston Hamilton, MD;  Location: [facility];  Service: Urology;  Laterality: N/A;  REQUESTING 90 MINS   US  VENOUS LOWER EXT Right 03/07/11   PERSISTENT DVT IN RIGHT LOWER EXT.WITH PERSISTANT VISUALIZATION OF HYPOECHOIC THROMBUS WITHIN THE FEMORAL, PROFUNDA FEMORAL AND POPLITEAL VEINS. WHEN COMPARED TO PREVIOUS, CLOT IS NO LONGER IDENTIFIED WITH IN THE RIGHT CFV.   Family History Family History  Problem Relation Age of Onset   Alzheimer's disease Mother    Skin cancer Father    Stomach cancer Father    Heart attack Father    Breast cancer Sister    Heart attack Sister    Stroke Sister    Bladder Cancer Sister    Heart murmur Sister    Esophageal cancer Brother    Throat cancer Brother    Stomach cancer Brother    Lung cancer Brother    Colon cancer Brother    Heart attack Brother    Heart attack Brother    Colon cancer Brother    Colon polyps Neg Hx    Rectal cancer Neg Hx     Social History Social History   Tobacco Use   Smoking status: Former    Current packs/day: 0.00    Average packs/day: 3.0 packs/day for 20.0 years (60.0 ttl pk-yrs)    Types: Cigarettes    Start date: 12/30/1976    Quit date: 12/30/1996    Years since quitting: 27.7    Passive exposure: Never   Smokeless tobacco: Never  Vaping Use   Vaping status: Never Used  Substance Use Topics   Alcohol use: No    Alcohol/week: 0.0 standard drinks of alcohol   Drug use: No   Allergies Orencia  [abatacept ], Carbamazepine, Celecoxib, Cephalexin, Enbrel [etanercept], Humira [adalimumab], Levofloxacin , Sulfa antibiotics, and Sulfasalazine  Review of Systems A thorough  review of systems was obtained and all systems are negative except as noted in the HPI and PMH.   Physical Exam Vital Signs  I have reviewed the triage vital signs BP (!) 156/93 (BP Location: Right Arm)   Pulse 75   Temp 99 F (37.2 C) (Oral)   Resp 18   Ht 5' 9 (1.753 m)   Wt 99.8 kg   SpO2 97%   BMI 32.49 kg/m  Physical  Exam Vitals and nursing note reviewed.  Constitutional:      General: He is not in acute distress.    Appearance: Normal appearance. He is well-developed. He is not ill-appearing.  HENT:     Head: Normocephalic and atraumatic.     Right Ear: External ear normal.     Left Ear: External ear normal.     Nose: Nose normal.     Mouth/Throat:     Mouth: Mucous membranes are moist.  Eyes:     General: No scleral icterus.       Right eye: No discharge.        Left eye: No discharge.  Neck:   Cardiovascular:     Rate and Rhythm: Normal rate.  Pulmonary:     Effort: Pulmonary effort is normal. No respiratory distress.     Breath sounds: No stridor.  Abdominal:     General: Abdomen is flat. There is no distension.     Tenderness: There is no guarding.  Musculoskeletal:        General: No deformity.     Cervical back: No rigidity.  Skin:    General: Skin is warm and dry.     Coloration: Skin is not cyanotic, jaundiced or pale.  Neurological:     Mental Status: He is alert and oriented to person, place, and time.     GCS: GCS eye subscore is 4. GCS verbal subscore is 5. GCS motor subscore is 6.     Cranial Nerves: Cranial nerves 2-12 are intact. No dysarthria.     Sensory: Sensation is intact.     Motor: Motor function is intact. No weakness.     Coordination: Coordination is intact. Coordination normal.     Comments: Strength 5/5 to BLUE/BLLE, equal and symmetric  Gait testing deferred secondary to patient safety.   Psychiatric:        Speech: Speech normal.        Behavior: Behavior normal. Behavior is cooperative.     ED Results and Treatments Labs (all labs ordered are listed, but only abnormal results are displayed) Labs Reviewed - No data to display                                                                                                                        Radiology CT Thoracic Spine Wo Contrast Result Date: 09/30/2024 CLINICAL DATA:  Fall pain EXAM: CT  THORACIC SPINE WITHOUT CONTRAST TECHNIQUE: Multidetector CT images of the thoracic were obtained using the standard protocol  without intravenous contrast. RADIATION DOSE REDUCTION: This exam was performed according to the departmental dose-optimization program which includes automated exposure control, adjustment of the mA and/or kV according to patient size and/or use of iterative reconstruction technique. COMPARISON:  None Available. FINDINGS: Alignment: Normal. Vertebrae: No acute fracture or focal pathologic process. Paraspinal and other soft tissues: No acute finding Disc levels: Multilevel degenerative osteophytes. No abnormal disc space widening or narrowing. No high-grade bony canal stenosis. IMPRESSION: No acute fracture or malalignment of the thoracic spine. Electronically Signed   By: Luke Bun M.D.   On: 09/30/2024 19:14   CT Lumbar Spine Wo Contrast Result Date: 09/30/2024 CLINICAL DATA:  Fall back trauma EXAM: CT LUMBAR SPINE WITHOUT CONTRAST TECHNIQUE: Multidetector CT imaging of the lumbar spine was performed without intravenous contrast administration. Multiplanar CT image reconstructions were also generated. RADIATION DOSE REDUCTION: This exam was performed according to the departmental dose-optimization program which includes automated exposure control, adjustment of the mA and/or kV according to patient size and/or use of iterative reconstruction technique. COMPARISON:  10/10/2021 radiograph FINDINGS: Segmentation: 5 lumbar type vertebrae. Alignment: Normal. Vertebrae: No acute fracture or focal pathologic process. Paraspinal and other soft tissues: Aortic atherosclerosis. No acute paravertebral or paraspinal soft tissue abnormality Disc levels: Patent disc spaces. No high-grade canal stenosis or foraminal narrowing. Mild multilevel facet degenerative change IMPRESSION: No acute osseous abnormality Electronically Signed   By: Luke Bun M.D.   On: 09/30/2024 19:10   CT Cervical Spine  Wo Contrast Result Date: 09/30/2024 EXAM: CT CERVICAL SPINE WITHOUT CONTRAST 09/30/2024 06:57:14 PM TECHNIQUE: CT of the cervical spine was performed without the administration of intravenous contrast. Multiplanar reformatted images are provided for review. Automated exposure control, iterative reconstruction, and/or weight based adjustment of the mA/kV was utilized to reduce the radiation dose to as low as reasonably achievable. COMPARISON: MRI cervical spine 1 [id number] CLINICAL HISTORY: Neck trauma (Age >= 64y). Fell yesterday backwards. Doesn't think he lost consciousness but pt states did not trip or get dizzy, he just fell back wards. Pt c/o pain to neck going all the way down spine and into tailbone. Pt c/o left lateral rib pain. C Collar placed upon entrance to triage. Pt tender to c spine with palpation. Pt is a/o. Color wnl. Denies cp/shob; PA in triage to assess pt at 1750. FINDINGS: CERVICAL SPINE: BONES AND ALIGNMENT: Reversal of the normal cervical lordosis. No evidence of traumatic malalignment. There is irregularity of the C4 inferior endplate with associated lucency extending through the inferior endplate and involving anterior endplate osteophytes concerning for fracture. There is approximately 25 percent height loss of the C4 vertebral body. Recommend MRI for further evaluation. No significant retropulsion. Chronic mild irregularity of the C3 inferior endplate. DEGENERATIVE CHANGES: Degenerative endplate changes and endplate osteophytes most pronounced at C5-C6 and C6-C7. Uncovertebral hypertrophy greatest on the right at C5-C6 and C6-C7. Disc osteophyte complex at C3-C4 eccentric to the right resulting in moderate spinal canal stenosis. Additional disc bulges at multiple levels. Facet arthrosis and uncovertebral hypertrophy at multiple levels. Foraminal stenosis is most pronounced on the right at C3-C4. SOFT TISSUES: No prevertebral soft tissue swelling. IMPRESSION: 1. Irregularity of the C4  inferior endplate with associated lucency and approximately 25% height loss, compatible with fracture. No significant retropulsion. Recommend MRI for further evaluation. 2. Degenerative changes as above. Foraminal stenosis at multiple levels, greatest and severe on the right at C3-C4. Electronically signed by: Donnice Mania MD 09/30/2024 07:09 PM EDT RP Workstation: [id number]   CT  Head Wo Contrast Result Date: 09/30/2024 CLINICAL DATA:  Head trauma EXAM: CT HEAD WITHOUT CONTRAST TECHNIQUE: Contiguous axial images were obtained from the base of the skull through the vertex without intravenous contrast. RADIATION DOSE REDUCTION: This exam was performed according to the departmental dose-optimization program which includes automated exposure control, adjustment of the mA and/or kV according to patient size and/or use of iterative reconstruction technique. COMPARISON:  CT brain 09/24/2012 FINDINGS: Brain: No acute territorial infarction, hemorrhage or intracranial mass. Mild atrophy. Patchy white matter hypodensity likely chronic small vessel ischemic change. Ventricles are nonenlarged. Vascular: No hyperdense vessels.  Carotid vascular calcification Skull: Normal. Negative for fracture or focal lesion. Sinuses/Orbits: Old fracture medial wall right orbit. Mild sinus mucosal disease Other: None IMPRESSION: 1. No CT evidence for acute intracranial abnormality. 2. Atrophy and chronic small vessel ischemic changes of the white matter. Electronically Signed   By: Luke Bun M.D.   On: 09/30/2024 19:07   DG Ribs Unilateral W/Chest Left Result Date: 09/30/2024 EXAM: AP VIEW(S) XRAY OF THE LEFT RIBS AND CHEST 09/30/2024 06:34:00 PM COMPARISON: None available. CLINICAL HISTORY: Fall. Per triage: Clemens yesterday backwards. Doesn't think he lost consciousness but pt states did not trip or get dizzy, he just fell backwards. Pt c/o pain to neck going all the way down spine and into tailbone. Pt c/o left lateral rib pain. C  Collar placed upon entrance to triage. Pt tender to c spine with palpation. Pt is a/o. Color wnl. Denies cp/shob; PA in triage to assess pt at 1750. Pt also complained of abdominal pain due to constipation. FINDINGS: BONES: No displaced rib fracture. LUNGS AND PLEURA: Low lung volumes. No consolidation or pulmonary edema. No pleural effusion or pneumothorax. HEART AND MEDIASTINUM: Aortic atherosclerosis. No acute abnormality of the cardiac silhouette. IMPRESSION: 1. No displaced rib fracture. Electronically signed by: Norman Gatlin MD 09/30/2024 07:02 PM EDT RP Workstation: [id number]    Pertinent labs & imaging results that were available during my care of the patient were reviewed by me and considered in my medical decision making (see MDM for details).  Medications Ordered in ED Medications  HYDROcodone -acetaminophen  (NORCO/VICODIN) 5-325 MG per tablet 1 tablet (1 tablet Oral Given 09/30/24 2112)                                                                                                                                     Procedures Procedures  (including critical care time)  Medical Decision Making / ED Course    Medical Decision Making:    AMIRI RIECHERS is a 73 y.o. male with past medical history as below, significant for asthma, VTE on warfarin, COPD, gout, prostate cancer who presents to the ED with complaint of fall. The complaint involves an extensive differential diagnosis and also carries with it a high risk of complications and morbidity.  Serious etiology was considered. Ddx includes but is not limited to: Differential diagnoses  for head trauma includes subdural hematoma, epidural hematoma, acute concussion, traumatic subarachnoid hemorrhage, cerebral contusions, spine fx, hematoma, etcetc.   Complete initial physical exam performed, notably the patient was in NAD.    Reviewed and confirmed nursing documentation for past medical history, family history, social history.   Vital signs reviewed.    Fall w/ head injury/low back injury on warfarin> - CT with possible C4 fx, he has ttp at this approx area. There are no neurologic deficits on exam - Spoke with neurosurgery, recommend follow-up in the office, hard collar - Collar applied, given analgesia for home.  Concussion precautions provided.   Clinical Course as of 09/30/24 2141  Thu Sep 30, 2024  1929 ?C4 fx [SG]  2139 Spke w/ nsgy Dr Butch, f/u office recommended, no MRI given wnl neuro exam [SG]    Clinical Course User Index [SG] Elnor Jayson LABOR, DO     9:41 PM:  I have discussed the diagnosis/risks/treatment options with the patient and family.  Evaluation and diagnostic testing in the emergency department does not suggest an emergent condition requiring admission or immediate intervention beyond what has been performed at this time.  They will follow up with neurosurgery, PCP. We also discussed returning to the ED immediately if new or worsening sx occur. We discussed the sx which are most concerning (e.g., sudden worsening pain, fever, inability to tolerate by mouth, severe headache, behavior changes, mental status change, gait disturbance) that necessitate immediate return.    The patient appears reasonably screen and/or stabilized for discharge and I doubt any other medical condition or other [facility] requiring further screening, evaluation, or treatment in the ED at this time prior to discharge.                 Additional history obtained: -Additional history obtained from family -External records from outside source obtained and reviewed including: Chart review including previous notes, labs, imaging, consultation notes including   Home medications INR 08/23/2024 1.7   Lab Tests: na  EKG   EKG Interpretation Date/Time:    Ventricular Rate:    PR Interval:    QRS Duration:    QT Interval:    QTC Calculation:   R Axis:      Text Interpretation:           Imaging  Studies ordered: I ordered imaging studies including ct head/c/t/l spine, rib x-ray left I independently visualized the following imaging with scope of interpretation limited to determining acute life threatening conditions related to emergency care; findings noted above I agree with the radiologist interpretation If any imaging was obtained with contrast I closely monitored patient for any possible adverse reaction a/w contrast administration in the emergency department   Medicines ordered and prescription drug management: Meds ordered this encounter  Medications   HYDROcodone -acetaminophen  (NORCO/VICODIN) 5-325 MG per tablet 1 tablet    Refill:  0    -I have reviewed the patients home medicines and have made adjustments as needed   Consultations Obtained: I requested consultation with the nsgy,  and discussed lab and imaging findings as well as pertinent plan - they recommend: see note   Cardiac Monitoring: Continuous pulse oximetry interpreted by myself, 98% on RA.    Social Determinants of Health:  Diagnosis or treatment significantly limited by social determinants of health: former smoker   Reevaluation: After the interventions noted above, I reevaluated the patient and found that they have improved  Co morbidities that complicate the patient evaluation  Past Medical  History:  Diagnosis Date   Arthritis    RA   Asthma    CHF (congestive heart failure) (HCC)    pt denies    Clotting disorder    DVT both legs    COPD (chronic obstructive pulmonary disease) (HCC)    DDD (degenerative disc disease), cervical    with UE's paresthesias   Diverticulosis    DVT, lower extremity (HCC)    bilat   Eye abnormality    right eye drifts has difficulty focusing with right eye has had since birth    GERD (gastroesophageal reflux disease)    Gout    History of measles    History of shingles    Hypercholesterolemia    IBS (irritable bowel syndrome)    IBS (irritable bowel  syndrome)    Peripheral edema    Pneumonia    hx of    Prostate cancer (HCC)    Rheumatoid arthritis (HCC)    Seizures (HCC)    last seizure 20 years ago; on dilantin . Unknown origin.   Shortness of breath dyspnea    exertion    Sleep apnea    cpap    Tubular adenoma of colon 04/2008      Dispostion: Disposition decision including need for hospitalization was considered, and patient discharged from emergency department.    Final Clinical Impression(s) / ED Diagnoses Final diagnoses:  Fall, initial encounter  Closed nondisplaced fracture of fourth cervical vertebra, unspecified fracture morphology, initial encounter (HCC)        Elnor Jayson LABOR, DO 09/30/24 2142

## 2024-09-30 NOTE — Telephone Encounter (Signed)
 Patient has not been evaluated at the emergency department. Per Waddell patient should really go be evaluated in the emergency department. Patient advised and expressed understanding.

## 2024-09-30 NOTE — Telephone Encounter (Signed)
 Please clarify if the patient has been evaluated at the emergency department?  He is on warfarin.  He is unable to take NSAIDs due to chronic kidney disease as well as being on anticoagulation.  He can take Tylenol  as needed for pain relief.

## 2024-09-30 NOTE — Telephone Encounter (Signed)
 Patient contacted the office stating  [place] fell at [place]. Patient states he would like to know if there is anything he can take because he is in pain. Please advise.

## 2024-09-30 NOTE — ED Triage Notes (Signed)
 Fell yesterday backwards. Doesn't think he lost consciousness but pt states did not trip or get dizzy, he just fell back wards. Pt c/o pain to neck going all the way down spine and into tailbone. Pt c/o left lateral rib pain. C Collar placed upon entrance to triage. Pt tender to c spine with palpation. Pt is a/o. Color wnl.  Denies cp/shob PA in triage to assess pt at 1750.

## 2024-09-30 NOTE — ED Notes (Signed)
 Placed in hard c-collar. Given instructions on use to patient and wife. Verbalized understanding from patient and wife. All questions answered. Given product information for care of device.

## 2024-09-30 NOTE — Discharge Instructions (Signed)
 It was a pleasure caring for you today in the emergency department.  Please call neurosurgery Dr. Darnella in the office for follow-up.  In the meantime please wear your cervical collar at all times.  Please return to the emergency department for any worsening or worrisome symptoms.

## 2024-09-30 NOTE — Progress Notes (Signed)
 CT reviewed - C4 compression fracture. Patient is reportedly neurologically intact  Recommendations: Activity as tolerated in hard cervical collar Will plan to see in clinic in the next 2 weeks with repeat x-rays Anticipate total 12 weeks in collar

## 2024-10-01 ENCOUNTER — Other Ambulatory Visit: Payer: Self-pay | Admitting: Physician Assistant

## 2024-10-01 DIAGNOSIS — L03115 Cellulitis of right lower limb: Secondary | ICD-10-CM | POA: Diagnosis not present

## 2024-10-01 DIAGNOSIS — I5032 Chronic diastolic (congestive) heart failure: Secondary | ICD-10-CM | POA: Diagnosis not present

## 2024-10-01 DIAGNOSIS — N179 Acute kidney failure, unspecified: Secondary | ICD-10-CM | POA: Diagnosis not present

## 2024-10-01 DIAGNOSIS — I130 Hypertensive heart and chronic kidney disease with heart failure and stage 1 through stage 4 chronic kidney disease, or unspecified chronic kidney disease: Secondary | ICD-10-CM | POA: Diagnosis not present

## 2024-10-01 DIAGNOSIS — I872 Venous insufficiency (chronic) (peripheral): Secondary | ICD-10-CM | POA: Diagnosis not present

## 2024-10-01 DIAGNOSIS — L97811 Non-pressure chronic ulcer of other part of right lower leg limited to breakdown of skin: Secondary | ICD-10-CM | POA: Diagnosis not present

## 2024-10-01 MED FILL — Hydrocodone-Acetaminophen Tab 5-325 MG: ORAL | Qty: 6 | Status: AC

## 2024-10-04 DIAGNOSIS — L03115 Cellulitis of right lower limb: Secondary | ICD-10-CM | POA: Diagnosis not present

## 2024-10-04 DIAGNOSIS — L97811 Non-pressure chronic ulcer of other part of right lower leg limited to breakdown of skin: Secondary | ICD-10-CM | POA: Diagnosis not present

## 2024-10-04 DIAGNOSIS — I130 Hypertensive heart and chronic kidney disease with heart failure and stage 1 through stage 4 chronic kidney disease, or unspecified chronic kidney disease: Secondary | ICD-10-CM | POA: Diagnosis not present

## 2024-10-04 DIAGNOSIS — I872 Venous insufficiency (chronic) (peripheral): Secondary | ICD-10-CM | POA: Diagnosis not present

## 2024-10-04 DIAGNOSIS — I5032 Chronic diastolic (congestive) heart failure: Secondary | ICD-10-CM | POA: Diagnosis not present

## 2024-10-04 DIAGNOSIS — N179 Acute kidney failure, unspecified: Secondary | ICD-10-CM | POA: Diagnosis not present

## 2024-10-04 NOTE — Progress Notes (Signed)
 Office Visit Note  Patient: [name]             Date of Birth: 01-Aug-1951           MRN: [id number]             PCP: Marvine Rush, MD Referring: Marvine Rush, MD Visit Date: 10/18/2024 Occupation: Data Unavailable  Subjective:  Neck and lower back pain   History of Present Illness: [name] is a 73 y.o. male with history of rheumatoid arthritis, gout, and osteoarthritis.  Patient is not currently taking immunosuppressive agents.  Patient states that the cellulitis on his right lower leg has completely resolved.  He would like to discuss resuming Arava  to manage his symptoms of rheumatoid arthritis.  He denies any flare of rheumatoid thrice but has had some increased generalized arthralgias and joint stiffness.  He denies any joint swelling at this time.  Patient had a fall in early October injuring his neck and lower back.  He is currently in a neck brace due to a cervical vertebral fracture.  Going to the patient he has to wear the brace for another 6 weeks.  He has had difficulty getting comfortable at night due to the discomfort in his neck and lower back. Patient is unsure when he discontinued Fosamax  but is open to restarting.    Activities of Daily Living:  Patient reports morning stiffness for 0 minute.   Patient Reports nocturnal pain.  Difficulty dressing/grooming: Denies Difficulty climbing stairs: Reports Difficulty getting out of chair: Denies Difficulty using hands for taps, buttons, cutlery, and/or writing: Reports  Review of Systems  Constitutional:  Negative for fatigue.  HENT:  Positive for mouth dryness. Negative for mouth sores.   Eyes:  Positive for dryness.  Respiratory:  Positive for shortness of breath.   Cardiovascular:  Negative for chest pain and palpitations.  Gastrointestinal:  Positive for constipation. Negative for blood in stool and diarrhea.  Endocrine: Negative for increased urination.  Genitourinary:  Negative for involuntary  urination.  Musculoskeletal:  Positive for joint pain, gait problem and joint pain. Negative for joint swelling, myalgias, muscle weakness, morning stiffness, muscle tenderness and myalgias.  Skin:  Positive for sensitivity to sunlight. Negative for color change, rash and hair loss.  Allergic/Immunologic: Negative for susceptible to infections.  Neurological:  Negative for dizziness and headaches.  Hematological:  Negative for swollen glands.  Psychiatric/Behavioral:  Negative for depressed mood and sleep disturbance. The patient is not nervous/anxious.     PMFS History:  Patient Active Problem List   Diagnosis Date Noted   Lower extremity edema 09/13/2024   PICC (peripherally inserted central catheter) removal 09/13/2024   Cellulitis of right lower extremity 09/09/2024   Medication management 09/09/2024   MRSA bacteremia 08/20/2024   MRSA infection 08/20/2024   Sepsis (HCC) 08/18/2024   Acute respiratory failure with hypoxia (HCC) 01/23/2023   Stage 3a chronic kidney disease (CKD) (HCC) 03/11/2022   Bronchiectasis without complication (HCC) 03/26/2021   Incontinence when straining, male 03/20/2021   Erectile dysfunction after radical prostatectomy 09/08/2020   OAB (overactive bladder) 02/23/2020   Class 2 severe obesity due to excess calories with serious comorbidity and body mass index (BMI) of 35.0 to 35.9 in adult    Chronic diastolic HF (heart failure) (HCC)    OSA (obstructive sleep apnea) 03/10/2019   Dyspnea on exertion 10/22/2017   History of seizure disorder 02/27/2017   Primary osteoarthritis of both hands 02/27/2017   Primary osteoarthritis of  both feet 02/27/2017   Idiopathic gout of multiple sites 02/27/2017   History of prostate cancer 12/29/2016   Primary osteoarthritis of both knees 12/29/2016   Chronic idiopathic gout involving toe without tophus 12/29/2016   History of COPD 12/29/2016   Plantar pustular psoriasis 12/29/2016   DVT (deep venous thrombosis) (HCC)  10/31/2016   COPD with acute exacerbation (HCC) 10/31/2016   CHF (congestive heart failure) (HCC) 10/31/2016   Prostate cancer (HCC) 08/30/2015   Malignant neoplasm of prostate (HCC) 07/04/2015   Chest pain at rest 07/05/2014   Weakness 07/05/2014   Complicated postphlebitic syndrome 11/16/2013   Personal history of DVT (deep vein thrombosis) 05/18/2013   Chronic anticoagulation 05/18/2013   Diverticulosis of colon without hemorrhage 05/18/2013   Rheumatoid arthritis (HCC) 05/18/2013   Seizure disorder (HCC) 05/18/2013   History of colonic polyps 05/18/2013   GERD 05/08/2010    Past Medical History:  Diagnosis Date   Arthritis    RA   Asthma    CHF (congestive heart failure) (HCC)    pt denies    Clotting disorder    DVT both legs    COPD (chronic obstructive pulmonary disease) (HCC)    DDD (degenerative disc disease), cervical    with UE's paresthesias   Diverticulosis    DVT, lower extremity (HCC)    bilat   Eye abnormality    right eye drifts has difficulty focusing 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 Unknown origin.   Shortness of breath dyspnea    exertion    Sleep apnea    cpap    Tubular adenoma of colon 04/2008    Family History  Problem Relation Age of Onset   Alzheimer's disease Mother    Skin cancer Father    Stomach cancer Father    Heart attack Father    Breast cancer Sister    Heart attack Sister    Stroke Sister    Bladder Cancer Sister    Heart murmur Sister    Esophageal cancer Brother    Throat cancer Brother    Stomach cancer Brother    Lung cancer Brother    Colon cancer Brother    Heart  attack Brother    Heart attack Brother    Colon cancer Brother    Colon polyps Neg Hx    Rectal cancer Neg Hx    Past Surgical History:  Procedure Laterality Date   CHOLECYSTECTOMY     CIRCUMCISION     COLONOSCOPY     CYSTOSCOPY WITH LITHOLAPAXY N/A 03/17/2019   Procedure: CYSTOSCOPY WITH LITHOLAPAXY AND REMOVAL OF FOREIGN BODY;  Surgeon: Sherrilee Belvie CROME, MD;  Location: [facility];  Service: Urology;  Laterality: N/A;   DOPPLER ECHOCARDIOGRAPHY N/A 01-21-2012   TECHNICALLY DIFFICULT. MILD CONCENTRIC LV HYPERTROPHY. LV CAVITY IS SMALL.. EF=> 55%. TRANSMITRAL SPECTRAL FLOW PATTREN IS SUGGESTIVE OF IMPAIRED LV RELAXATION. RV SYSTOLIC PRESSURE IS . LEFT ATRIAL SIZE IS NORMAL. AV APPEARS MILDLY SCLEROTIC. NO SIGN VALVE DISEASE NOTED.   LEFT HEART CATH  AND CORONARY ANGIOGRAPHY N/A 03/12/2022   Procedure: LEFT HEART CATH AND CORONARY ANGIOGRAPHY;  Surgeon: Wonda Sharper, MD;  Location: [facility] CV LAB;  Service: Cardiovascular;  Laterality: N/A;   LYMPHADENECTOMY Bilateral 08/30/2015   Procedure: PELVIC LYMPHADENECTOMY;  Surgeon: Belvie LITTIE Clara, MD;  Location: [facility];  Service: Urology;  Laterality: Bilateral;   NUCLEAR STRESS TEST N/A 01-21-2012   NORMAL PATTERN OF PERFUSION IN ALL REGIONS. POST STRESS LV SIZE IS NORMAL. NO EVIDENCE OF INDUCIBLE ISCHEMIA. EF 54%.   POLYPECTOMY     PROSTATE BIOPSY     ROBOT ASSISTED LAPAROSCOPIC RADICAL PROSTATECTOMY N/A 08/30/2015   Procedure: ROBOTIC ASSISTED LAPAROSCOPIC RADICAL PROSTATECTOMY;  Surgeon: Belvie LITTIE Clara, MD;  Location: [facility];  Service: Urology;  Laterality: N/A;   TEE WITHOUT CARDIOVERSION N/A 08/23/2024   Procedure: ECHOCARDIOGRAM, TRANSESOPHAGEAL;  Surgeon: Alvan Dorn FALCON, MD;  Location: [facility];  Service: Endoscopy;  Laterality: N/A;   URINARY SPHINCTER IMPLANT N/A 03/20/2021   Procedure: ARTIFICIAL URINARY SPHINCTER CYSTOSCOPY;  Surgeon: Gaston Hamilton, MD;  Location: [facility];  Service: Urology;  Laterality: N/A;   REQUESTING 90 MINS   US  VENOUS LOWER EXT Right 03/07/11   PERSISTENT DVT IN RIGHT LOWER EXT.WITH PERSISTANT VISUALIZATION OF HYPOECHOIC THROMBUS WITHIN THE FEMORAL, PROFUNDA FEMORAL AND POPLITEAL VEINS. WHEN COMPARED TO PREVIOUS, CLOT IS NO LONGER IDENTIFIED WITH IN THE RIGHT CFV.   Social History   Tobacco Use   Smoking status: Former    Current packs/day: 0.00    Average packs/day: 3.0 packs/day for 20.0 years (60.0 ttl pk-yrs)    Types: Cigarettes    Start date: 12/30/1976    Quit date: 12/30/1996    Years since quitting: 27.8    Passive exposure: Never   Smokeless tobacco: Never  Vaping Use   Vaping status: Never Used  Substance Use Topics   Alcohol use: No    Alcohol/week: 0.0 standard drinks of alcohol   Drug use: No   Social History   Social History Narrative   Not on file     Immunization History  Administered Date(s) Administered   Fluad Quad(high Dose 65+) 09/30/2019   INFLUENZA, HIGH DOSE SEASONAL PF 10/05/2018, 10/10/2021   Influenza,inj,Quad PF,6+ Mos 08/31/2015   Influenza-Unspecified 09/30/2016, 08/22/2017   Moderna Sars-Covid-2 Vaccination 01/21/2020, 02/18/2020, 10/25/2020, 10/10/2021   Pneumococcal Conjugate-13 10/05/2018   Pneumococcal-Unspecified 12/30/2012   Td 07/05/1996     Objective: Vital Signs: BP (!) 161/81   Pulse 64   Temp 98.1 F (36.7 C)   Resp 14   Ht 5' 9 (1.753 m)   Wt 214 lb 9.6 oz (97.3 kg)   BMI 31.69 kg/m    Physical Exam Vitals and nursing note reviewed.  Constitutional:      Appearance: He is well-developed.  HENT:     Head: Normocephalic and atraumatic.  Eyes:     Conjunctiva/sclera: Conjunctivae normal.     Pupils: Pupils are equal, round, and reactive to light.  Cardiovascular:     Rate and Rhythm: Normal rate and regular rhythm.     Heart sounds: Normal heart sounds.  Pulmonary:     Effort: Pulmonary effort is normal.     Breath sounds: Normal breath sounds.  Abdominal:     General: Bowel sounds are normal.      Palpations: Abdomen is soft.  Musculoskeletal:     Cervical back: Normal range of motion and neck supple.  Skin:    General: Skin is warm and dry.     Capillary  Refill: Capillary refill takes less than 2 seconds.  Neurological:     Mental Status: He is alert and oriented to person, place, and time.  Psychiatric:        Behavior: Behavior normal.      Musculoskeletal Exam: C-spine in a C-spine brace. Thoracic kyphosis noted.  Shoulder joints have slightly limited.  Tenderness along the medial aspect of both elbows.  Wrist joints have good ROM with no tenderness or joint swelling.  Synovial thickening of the right 1st MCP joint. PIP and DIP thickening.  CMC joint thickening. No warmth or effusion of knee joints.  Pedal edema noted bilateral LE.  CDAI Exam: CDAI Score: -- Patient Global: --; Provider Global: -- Swollen: --; Tender: -- Joint Exam 10/18/2024   No joint exam has been documented for this visit   There is currently no information documented on the homunculus. Go to the Rheumatology activity and complete the homunculus joint exam.  Investigation: No additional findings.  Imaging: CT Thoracic Spine Wo Contrast Result Date: 09/30/2024 CLINICAL DATA:  Fall pain EXAM: CT THORACIC SPINE WITHOUT CONTRAST TECHNIQUE: Multidetector CT images of the thoracic were obtained using the standard protocol without intravenous contrast. RADIATION DOSE REDUCTION: This exam was performed according to the departmental dose-optimization program which includes automated exposure control, adjustment of the mA and/or kV according to patient size and/or use of iterative reconstruction technique. COMPARISON:  None Available. FINDINGS: Alignment: Normal. Vertebrae: No acute fracture or focal pathologic process. Paraspinal and other soft tissues: No acute finding Disc levels: Multilevel degenerative osteophytes. No abnormal disc space widening or narrowing. No high-grade bony canal stenosis. IMPRESSION:  No acute fracture or malalignment of the thoracic spine. Electronically Signed   By: Luke Bun M.D.   On: 09/30/2024 19:14   CT Lumbar Spine Wo Contrast Result Date: 09/30/2024 CLINICAL DATA:  Fall back trauma EXAM: CT LUMBAR SPINE WITHOUT CONTRAST TECHNIQUE: Multidetector CT imaging of the lumbar spine was performed without intravenous contrast administration. Multiplanar CT image reconstructions were also generated. RADIATION DOSE REDUCTION: This exam was performed according to the departmental dose-optimization program which includes automated exposure control, adjustment of the mA and/or kV according to patient size and/or use of iterative reconstruction technique. COMPARISON:  10/10/2021 radiograph FINDINGS: Segmentation: 5 lumbar type vertebrae. Alignment: Normal. Vertebrae: No acute fracture or focal pathologic process. Paraspinal and other soft tissues: Aortic atherosclerosis. No acute paravertebral or paraspinal soft tissue abnormality Disc levels: Patent disc spaces. No high-grade canal stenosis or foraminal narrowing. Mild multilevel facet degenerative change IMPRESSION: No acute osseous abnormality Electronically Signed   By: Luke Bun M.D.   On: 09/30/2024 19:10   CT Cervical Spine Wo Contrast Result Date: 09/30/2024 EXAM: CT CERVICAL SPINE WITHOUT CONTRAST 09/30/2024 06:57:14 PM TECHNIQUE: CT of the cervical spine was performed without the administration of intravenous contrast. Multiplanar reformatted images are provided for review. Automated exposure control, iterative reconstruction, and/or weight based adjustment of the mA/kV was utilized to reduce the radiation dose to as low as reasonably achievable. COMPARISON: MRI cervical spine 1 [id number] CLINICAL HISTORY: Neck trauma (Age >= 65y). Fell yesterday backwards. Doesn't think he lost consciousness but pt states did not trip or get dizzy, he just fell back wards. Pt c/o pain to neck going all the way down spine and into tailbone. Pt c/o  left lateral rib pain. C Collar placed upon entrance to triage. Pt tender to c spine with palpation. Pt is a/o. Color wnl. Denies cp/shob; PA in triage to assess pt at  1750. FINDINGS: CERVICAL SPINE: BONES AND ALIGNMENT: Reversal of the normal cervical lordosis. No evidence of traumatic malalignment. There is irregularity of the C4 inferior endplate with associated lucency extending through the inferior endplate and involving anterior endplate osteophytes concerning for fracture. There is approximately 25 percent height loss of the C4 vertebral body. Recommend MRI for further evaluation. No significant retropulsion. Chronic mild irregularity of the C3 inferior endplate. DEGENERATIVE CHANGES: Degenerative endplate changes and endplate osteophytes most pronounced at C5-C6 and C6-C7. Uncovertebral hypertrophy greatest on the right at C5-C6 and C6-C7. Disc osteophyte complex at C3-C4 eccentric to the right resulting in moderate spinal canal stenosis. Additional disc bulges at multiple levels. Facet arthrosis and uncovertebral hypertrophy at multiple levels. Foraminal stenosis is most pronounced on the right at C3-C4. SOFT TISSUES: No prevertebral soft tissue swelling. IMPRESSION: 1. Irregularity of the C4 inferior endplate with associated lucency and approximately 25% height loss, compatible with fracture. No significant retropulsion. Recommend MRI for further evaluation. 2. Degenerative changes as above. Foraminal stenosis at multiple levels, greatest and severe on the right at C3-C4. Electronically signed by: Donnice Mania MD 09/30/2024 07:09 PM EDT RP Workstation: [id number]   CT Head Wo Contrast Result Date: 09/30/2024 CLINICAL DATA:  Head trauma EXAM: CT HEAD WITHOUT CONTRAST TECHNIQUE: Contiguous axial images were obtained from the base of the skull through the vertex without intravenous contrast. RADIATION DOSE REDUCTION: This exam was performed according to the departmental dose-optimization program which  includes automated exposure control, adjustment of the mA and/or kV according to patient size and/or use of iterative reconstruction technique. COMPARISON:  CT brain 09/24/2012 FINDINGS: Brain: No acute territorial infarction, hemorrhage or intracranial mass. Mild atrophy. Patchy white matter hypodensity likely chronic small vessel ischemic change. Ventricles are nonenlarged. Vascular: No hyperdense vessels.  Carotid vascular calcification Skull: Normal. Negative for fracture or focal lesion. Sinuses/Orbits: Old fracture medial wall right orbit. Mild sinus mucosal disease Other: None IMPRESSION: 1. No CT evidence for acute intracranial abnormality. 2. Atrophy and chronic small vessel ischemic changes of the white matter. Electronically Signed   By: Luke Bun M.D.   On: 09/30/2024 19:07   DG Ribs Unilateral W/Chest Left Result Date: 09/30/2024 EXAM: AP VIEW(S) XRAY OF THE LEFT RIBS AND CHEST 09/30/2024 06:34:00 PM COMPARISON: None available. CLINICAL HISTORY: Fall. Per triage: Clemens yesterday backwards. Doesn't think he lost consciousness but pt states did not trip or get dizzy, he just fell backwards. Pt c/o pain to neck going all the way down spine and into tailbone. Pt c/o left lateral rib pain. C Collar placed upon entrance to triage. Pt tender to c spine with palpation. Pt is a/o. Color wnl. Denies cp/shob; PA in triage to assess pt at 1750. Pt also complained of abdominal pain due to constipation. FINDINGS: BONES: No displaced rib fracture. LUNGS AND PLEURA: Low lung volumes. No consolidation or pulmonary edema. No pleural effusion or pneumothorax. HEART AND MEDIASTINUM: Aortic atherosclerosis. No acute abnormality of the cardiac silhouette. IMPRESSION: 1. No displaced rib fracture. Electronically signed by: Norman Gatlin MD 09/30/2024 07:02 PM EDT RP Workstation: [id number]    Recent Labs: Lab Results  Component Value Date   WBC 7.0 08/23/2024   HGB 13.7 08/23/2024   PLT 161 08/23/2024   NA 141  08/23/2024   K 4.2 08/23/2024   CL 108 08/23/2024   CO2 27 08/23/2024   GLUCOSE 108 (H) 08/23/2024   BUN 16 08/23/2024   CREATININE 1.23 08/23/2024   BILITOT 1.0 08/18/2024   ALKPHOS 58 08/18/2024  AST 29 08/18/2024   ALT 22 08/18/2024   PROT 6.9 08/18/2024   ALBUMIN 3.9 08/18/2024   CALCIUM  9.0 08/23/2024   GFRAA >60 09/20/2020   QFTBGOLDPLUS Negative 02/02/2024    Speciality Comments: ACTEMRA  4mg /kg x 4 weeks PPD negative 01/02/17  Procedures:  No procedures performed Allergies: Orencia  [abatacept ], Carbamazepine, Celecoxib, Cephalexin, Enbrel [etanercept], Humira [adalimumab], Levofloxacin , Sulfa antibiotics, and Sulfasalazine   Assessment / Plan:     Visit Diagnoses: Rheumatoid arthritis involving multiple sites with positive rheumatoid factor (HCC): He has no synovitis on examination today.  He is not currently taking immunosuppressive agents.  He is hoping to resume Arava  since the cellulitis on his right lower leg has healed and has not had recurrence.  He has noted some increased generalized arthralgias and joint stiffness.  Plan to reach out to ID for clearance prior to resuming Arava .  He will require close monitoring when and if he resumes Arava .  Do not plan to resume a biologic due to recent sepsis.   He will follow up in 2 months or sooner if needed.   High risk medication use - Currently holding arava - will require clearance by ID prior to resuming Arava -plan to reach out for clearance  d/c Actemra  due to recurrent infections-cellulitis and recent sepsis.  Do not plan to resume a biologic at this time. BMP updated on 10/14/24. CBC updated on 08/31/24.  AST 43 and ALT 63 updated 08/31/24.   He will require close monitoring if he resumes Arava . He is aware that if he resumes Arava  he is to hold Arava  if he develops signs or symptoms of an infection.  Elevated serum creatinine: Creatinine was 1.21 and GFR was 63 on 10/14/2024.  He will continue to require close lab  monitoring.  History of hyperlipidemia: Plan to obtain most recent lipid panel ordered by PCP  History of vertebral fracture: CT of the cervical spine obtained on 09/30/2024 after a fall.  Irregularity of the C4 inferior endplate with associated lucency and approximately 25% height loss, compatible with fracture.  Patient is currently in a C-spine brace which he will wear for the next 6 weeks. Deborah treatment options for management of osteoporosis were discussed.  He is unsure when he discontinued Fosamax .  Discussed that he will benefit from more aggressive treatment.  Reviewed indications and contraindications of Prolia.  Plan to apply for Prolia through insurance.  If he is in agreement to initiate he will likely require an updated vitamin D  and calcium  prior to scheduling Prolia.  Age-related osteoporosis without current pathological fracture - DEXA updated on 02/19/2023: LFN BMD 0.632 with T-score -2.2.  Statistically significant increase left hip.  Lumbar and hip stable. Currently in a C-spine brace x6 more weeks--recent C4 vertebral fracture.  He has been taking vitamin D  1000 units daily. Previously on fosamax  ~1.5 years-unsure when he discontinued.  Encouraged the patient to avoid cortisone injections and oral prednisone  if possible. Discussed that due to his recent cervical spinal fracture he would benefit from more aggressive treatment for management of osteoporosis.  Discussed the option of switching to Prolia 60 mg sq injections once every 6 months.  Idiopathic chronic gout of multiple sites without tophus: He has not had any signs or symptoms of a gout flare.  He has clinically been doing well taking allopurinol  200 mg daily.  He will notify us  if he develops any signs or symptoms of a flare.  Primary osteoarthritis of both hands: He has no synovitis on examination.  PIP and DIP thickening noted.  Discussed the importance of joint protection and muscle strengthening. He experiences  stiffness in both hands.   Primary osteoarthritis of both knees: No warmth or effusion noted.   Primary osteoarthritis of both feet: Pedal edema noted in bilateral LE.  He is not experiencing any discomfort in his feet at this time.   Other medical conditions are listed as follows:  History of abscessed tooth  History of COPD  History of adenomatous polyp of colon  History of seizure disorder  History of CHF (congestive heart failure)  History of prostate cancer  History of DVT (deep vein thrombosis):On Warfarin.   Sepsis due to cellulitis [facility]): Do not plan to resume a biologic at this time.   Orders: No orders of the defined types were placed in this encounter.  Meds ordered this encounter  Medications   leflunomide  (ARAVA ) 20 MG tablet    Sig: Take 1 tablet (20 mg total) by mouth daily.    Dispense:  90 tablet    Refill:  0    Follow-Up Instructions: Return in about 2 months (around 12/18/2024) for Rheumatoid arthritis, Gout, Osteoarthritis.   Waddell CHRISTELLA Craze, PA-C  Note - This record has been created using Dragon software.  Chart creation errors have been sought, but may not always  have been located. Such creation errors do not reflect on  the standard of medical care.

## 2024-10-06 ENCOUNTER — Encounter: Payer: Self-pay | Admitting: Urology

## 2024-10-06 ENCOUNTER — Ambulatory Visit: Admitting: Urology

## 2024-10-06 VITALS — BP 147/76 | HR 68

## 2024-10-06 DIAGNOSIS — N529 Male erectile dysfunction, unspecified: Secondary | ICD-10-CM | POA: Diagnosis not present

## 2024-10-06 DIAGNOSIS — C61 Malignant neoplasm of prostate: Secondary | ICD-10-CM | POA: Diagnosis not present

## 2024-10-06 DIAGNOSIS — N3281 Overactive bladder: Secondary | ICD-10-CM | POA: Diagnosis not present

## 2024-10-06 DIAGNOSIS — N5231 Erectile dysfunction following radical prostatectomy: Secondary | ICD-10-CM

## 2024-10-06 MED ORDER — TROSPIUM CHLORIDE ER 60 MG PO CP24: 1.0000 | ORAL_CAPSULE | Freq: Every day | ORAL | 11 refills | Status: AC

## 2024-10-06 NOTE — Progress Notes (Signed)
 10/06/2024 10:45 AM   [name] 03/19/1951 [id number]  Referring provider: Marvine Rush, MD [address] [place],  [place] [address]  Followup prostate cancer   HPI: Mr Soward is a 73yo here for followup for Prostate cancer, OAB, and erectile dysfunction. PSA remains undetectable. He remains on ditropan  10mg  XL daily for his urinary urgency and frequency. His urinary urgency and frequency has worsened since last visit. He had a fall last week and fractured a vertebrae in his neck.    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 Unknown origin.   Shortness of breath dyspnea    exertion    Sleep apnea    cpap    Tubular adenoma of colon 04/2008    Surgical History: Past Surgical History:  Procedure Laterality Date   CHOLECYSTECTOMY     CIRCUMCISION     COLONOSCOPY     CYSTOSCOPY WITH LITHOLAPAXY N/A 03/17/2019   Procedure: CYSTOSCOPY WITH LITHOLAPAXY AND REMOVAL OF FOREIGN BODY;  Surgeon: Sherrilee Belvie CROME, MD;  Location: [facility];  Service: Urology;  Laterality: N/A;   DOPPLER ECHOCARDIOGRAPHY N/A 01-21-2012   TECHNICALLY DIFFICULT. MILD CONCENTRIC LV HYPERTROPHY. LV CAVITY IS SMALL.. EF=> 55%. TRANSMITRAL SPECTRAL FLOW  PATTREN IS SUGGESTIVE OF IMPAIRED LV RELAXATION. RV SYSTOLIC PRESSURE IS . LEFT ATRIAL SIZE IS NORMAL. AV APPEARS MILDLY SCLEROTIC. NO SIGN VALVE DISEASE NOTED.   LEFT HEART CATH AND CORONARY ANGIOGRAPHY N/A 03/12/2022   Procedure: LEFT HEART CATH AND CORONARY ANGIOGRAPHY;  Surgeon: Wonda Sharper, MD;  Location: [facility] INVASIVE CV LAB;  Service: Cardiovascular;  Laterality: N/A;   LYMPHADENECTOMY Bilateral 08/30/2015   Procedure: PELVIC LYMPHADENECTOMY;  Surgeon: Belvie CROME Sherrilee, MD;  Location: [facility];  Service: Urology;  Laterality: Bilateral;   NUCLEAR STRESS TEST N/A 01-21-2012   NORMAL PATTERN OF PERFUSION IN ALL REGIONS. POST STRESS LV SIZE IS NORMAL. NO EVIDENCE OF INDUCIBLE ISCHEMIA. EF 54%.   POLYPECTOMY     PROSTATE BIOPSY     ROBOT ASSISTED LAPAROSCOPIC RADICAL PROSTATECTOMY N/A 08/30/2015   Procedure: ROBOTIC ASSISTED LAPAROSCOPIC RADICAL PROSTATECTOMY;  Surgeon: Belvie CROME Sherrilee, MD;  Location: [facility];  Service: Urology;  Laterality: N/A;   TEE WITHOUT CARDIOVERSION N/A 08/23/2024   Procedure: ECHOCARDIOGRAM, TRANSESOPHAGEAL;  Surgeon: Alvan Dorn FALCON, MD;  Location: [facility];  Service: Endoscopy;  Laterality: N/A;   URINARY SPHINCTER IMPLANT N/A 03/20/2021   Procedure: ARTIFICIAL URINARY SPHINCTER CYSTOSCOPY;  Surgeon: Gaston Hamilton, MD;  Location: [facility];  Service: Urology;  Laterality: N/A;  REQUESTING 90 MINS   US  VENOUS LOWER EXT Right 03/07/11   PERSISTENT DVT IN RIGHT LOWER EXT.WITH PERSISTANT VISUALIZATION OF HYPOECHOIC THROMBUS WITHIN THE FEMORAL, PROFUNDA FEMORAL AND POPLITEAL VEINS. WHEN COMPARED TO PREVIOUS, CLOT IS NO LONGER IDENTIFIED WITH IN THE RIGHT CFV.    Home Medications:  Allergies as of 10/06/2024       Reactions   Orencia  [abatacept ] Anaphylaxis   Had prostate cancer   Carbamazepine Rash   Makes drowsy Brand name is Tegretol    Celecoxib Itching, Rash   Brand name is Celebrex   Cephalexin Rash   Severe Rash. Brand name is Keflex Patient has  tolerated ceftriaxone  05/2019   Enbrel [etanercept] Rash   Humira [adalimumab] Rash   Levofloxacin  Other (See Comments)   GI Intolerance Brand name is Levaquin    Sulfa Antibiotics Rash   Sulfasalazine Rash        Medication List        Accurate as of October 06, 2024 10:45 AM. If you have any questions, ask your nurse or doctor.          ACTEMRA  IV Inject into the vein every 28 (twenty-eight) days.   allopurinol  100 MG tablet Commonly known as: ZYLOPRIM  Take 1 tablet (100 mg total) by mouth 2 (two) times daily.   amLODipine  10 MG tablet Commonly known as: NORVASC  Take 1 tablet (10 mg total) by mouth daily.   ascorbic acid 500 MG tablet Commonly known as: VITAMIN C Take 500 mg by mouth at bedtime.   Breztri  Aerosphere 160-9-4.8 MCG/ACT Aero inhaler Generic drug: budesonide -glycopyrrolate -formoterol  Inhale 2 puffs into the lungs in the morning.   cholecalciferol 25 MCG (1000 UNIT) tablet Commonly known as: VITAMIN D3 Take 1,000 Units by mouth at bedtime.   dicyclomine  10 MG capsule Commonly known as: BENTYL  TAKE 1 CAPSULE BY MOUTH 4 TIMES DAILY BEFORE MEALS AND AT BEDTIME   furosemide  80 MG tablet Commonly known as: LASIX  TAKE 1 TABLET BY MOUTH EVERY DAY   leflunomide  20 MG tablet Commonly known as: ARAVA  TAKE 1 TABLET BY MOUTH EVERY DAY   lidocaine  5 % Commonly known as: Lidoderm  Place 1 patch onto the skin daily as needed. Remove & Discard patch within 12 hours or as directed by MD   mupirocin  ointment 2 % Commonly known as: BACTROBAN  Apply topically 3 (three) times daily.   omeprazole  40 MG capsule Commonly known as: PRILOSEC Take 1 capsule (40 mg total) by mouth daily.   oxybutynin  10 MG 24 hr tablet Commonly known as: DITROPAN -XL Take 1 tablet (10 mg total) by mouth daily.   oxyCODONE -acetaminophen  5-325 MG tablet Commonly known as: PERCOCET/ROXICET Take 1 tablet by mouth every 6 (six) hours as needed for severe pain (pain score 7-10).    phenytoin  100 MG ER capsule Commonly known as: DILANTIN  Take 100-200 mg by mouth 2 (two) times daily. Take 1 capsule in the morning and 2 capsules at bedtime   Potassium Chloride  ER 20 MEQ Tbcr Take 20 mEq by mouth daily.   rosuvastatin  10 MG tablet Commonly known as: CRESTOR  Take 10 mg by mouth at bedtime.   spironolactone  50 MG tablet Commonly known as: ALDACTONE  Take 1 tablet (50 mg total) by mouth daily.   warfarin 4 MG tablet Commonly known as: COUMADIN  Take 4 mg by mouth at bedtime.        Allergies:  Allergies  Allergen Reactions   Orencia  [Abatacept ] Anaphylaxis    Had prostate cancer   Carbamazepine  Rash    Makes drowsy Brand name is Tegretol    Celecoxib Itching and Rash    Brand name is Celebrex   Cephalexin Rash    Severe Rash. Brand name is Keflex Patient has tolerated ceftriaxone  05/2019   Enbrel [Etanercept] Rash   Humira [Adalimumab] Rash   Levofloxacin  Other (See Comments)    GI Intolerance Brand name is Levaquin    Sulfa Antibiotics Rash   Sulfasalazine Rash    Family History: Family History  Problem Relation Age of Onset   Alzheimer's disease Mother    Skin cancer Father    Stomach cancer Father    Heart attack Father    Breast cancer Sister    Heart attack Sister    Stroke Sister    Bladder Cancer Sister    Heart murmur Sister    Esophageal cancer Brother    Throat cancer Brother    Stomach cancer Brother    Lung cancer Brother    Colon cancer Brother    Heart attack Brother    Heart attack Brother    Colon cancer Brother    Colon polyps Neg Hx    Rectal cancer Neg Hx     Social History:  reports that he quit smoking about 27 years ago. His smoking use included cigarettes. He started smoking about 47 years ago. He has a 60 pack-year smoking history. He has never been exposed to tobacco smoke. He has never used smokeless tobacco. He reports that he does not drink alcohol and does not use drugs.  ROS: All other review of  systems were reviewed and are negative except what is noted above in HPI  Physical Exam: BP (!) 147/76   Pulse 68   Constitutional:  Alert and oriented, No acute distress. HEENT: [place] AT, moist mucus membranes.  Trachea midline, no masses. Cardiovascular: No clubbing, cyanosis, or edema. Respiratory: Normal respiratory effort, no increased work of breathing. GI: Abdomen is soft, nontender, nondistended, no abdominal masses GU: No CVA tenderness.  Lymph: No cervical or inguinal lymphadenopathy. Skin: No rashes, bruises or suspicious lesions. Neurologic: Grossly intact, no focal deficits, moving all 4 extremities. Psychiatric: Normal mood and affect.  Laboratory Data: Lab Results  Component Value Date   WBC 7.0 08/23/2024   HGB 13.7 08/23/2024   HCT 42.3 08/23/2024   MCV 89.8 08/23/2024   PLT 161 08/23/2024    Lab Results  Component Value Date   CREATININE 1.23 08/23/2024    Lab Results  Component Value Date   PSA <0.1 02/16/2020    No results found for: TESTOSTERONE   No results found for: HGBA1C  Urinalysis    Component Value Date/Time   COLORURINE YELLOW 08/18/2024 1140   APPEARANCEUR CLEAR 08/18/2024 1140   APPEARANCEUR Clear 03/21/2023 0944   LABSPEC 1.014 08/18/2024 1140   PHURINE 5.0 08/18/2024 1140   GLUCOSEU NEGATIVE 08/18/2024 1140   HGBUR NEGATIVE 08/18/2024 1140   BILIRUBINUR NEGATIVE 08/18/2024 1140   BILIRUBINUR Negative 03/21/2023 0944   KETONESUR NEGATIVE 08/18/2024 1140   PROTEINUR NEGATIVE 08/18/2024 1140   UROBILINOGEN 0.2 09/08/2020 1326   UROBILINOGEN 0.2 09/24/2012 1640   NITRITE NEGATIVE 08/18/2024 1140   LEUKOCYTESUR NEGATIVE 08/18/2024 1140    Lab Results  Component Value Date   LABMICR Comment 03/21/2023   BACTERIA NONE SEEN 08/18/2024    Pertinent Imaging:  Results for orders placed during the hospital encounter of 05/27/13  DG Abd 1 View  Narrative *RADIOLOGY REPORT*  Clinical Data: The mid to lower abdominal  pain  for 1 month with some nausea and constipation  ABDOMEN - 1 VIEW  Comparison: 03/15/2011, abdomen CT  Findings: The bowel gas pattern is within normal limits.  There is no significant increased stool.  There are changes from a prior cholecystectomy.  The soft tissues are otherwise unremarkable.  No significant bony abnormality.  IMPRESSION: No acute findings.  No obstruction.  No significant increase in stool.   Original Report Authenticated By: Alm Parkins, M.D.  Results for orders placed during the hospital encounter of 08/18/24  US  Venous Img Lower Bilateral  Addendum 08/18/2024 11:41 AM ADDENDUM REPORT: 08/18/2024 11:38  ADDENDUM: Critical Value/emergent results were called by telephone at the time of interpretation by Dr Duwaine Severs on August 18, 2024 to provider Dr. Freddi who verbally acknowledged these results.   Electronically Signed By: Megan  Zare M.D. On: 08/18/2024 11:38  Narrative CLINICAL DATA:  Chronic lower extremity edema  EXAM: Bilateral LOWER EXTREMITY VENOUS DOPPLER ULTRASOUND  TECHNIQUE: Gray-scale sonography with compression, as well as color and duplex ultrasound, were performed to evaluate the deep venous system(s) from the level of the common femoral vein through the popliteal and proximal calf veins.  COMPARISON:  July 26, 2024  FINDINGS: VENOUS  There is a partially occlusive thrombus within the normal-sized left popliteal vein with lack of complete compressibility.  Otherwise there is normal compressibility of the common femoral, superficial femoral, and popliteal veins, as well as the remainder of the visualized calf veins. Visualized portions of profunda femoral vein and great saphenous vein unremarkable.  No filling defects to suggest DVT on grayscale or color Doppler imaging within the remainder of the vessels. Doppler waveforms show normal direction of venous flow, normal respiratory plasticity and response to  augmentation.  OTHER  Bilateral lower extremity soft tissue edema distal to the knee.  Limitations: none  IMPRESSION: Partially occlusive left popliteal vein thrombus with lack of complete compressibility.  No DVT on the right.  Soft tissue edema in bilateral lower extremity distal to the knee.  Electronically Signed: By: Megan  Zare M.D. On: 08/18/2024 11:32  No results found for this or any previous visit.  No results found for this or any previous visit.  No results found for this or any previous visit.  No results found for this or any previous visit.  No results found for this or any previous visit.  No results found for this or any previous visit.   Assessment & Plan:    1. Prostate cancer (HCC) (Primary) -followup 1 year with PSA - Urinalysis, Routine w reflex microscopic  2. OAB (overactive bladder) -switch to sanctura 60mg  daily     No follow-ups on file.  Belvie Clara, MD  [facility] [place]

## 2024-10-06 NOTE — Patient Instructions (Signed)

## 2024-10-08 DIAGNOSIS — I872 Venous insufficiency (chronic) (peripheral): Secondary | ICD-10-CM | POA: Diagnosis not present

## 2024-10-08 DIAGNOSIS — N179 Acute kidney failure, unspecified: Secondary | ICD-10-CM | POA: Diagnosis not present

## 2024-10-08 DIAGNOSIS — I5032 Chronic diastolic (congestive) heart failure: Secondary | ICD-10-CM | POA: Diagnosis not present

## 2024-10-08 DIAGNOSIS — L03115 Cellulitis of right lower limb: Secondary | ICD-10-CM | POA: Diagnosis not present

## 2024-10-08 DIAGNOSIS — I130 Hypertensive heart and chronic kidney disease with heart failure and stage 1 through stage 4 chronic kidney disease, or unspecified chronic kidney disease: Secondary | ICD-10-CM | POA: Diagnosis not present

## 2024-10-08 DIAGNOSIS — L97811 Non-pressure chronic ulcer of other part of right lower leg limited to breakdown of skin: Secondary | ICD-10-CM | POA: Diagnosis not present

## 2024-10-11 ENCOUNTER — Telehealth: Payer: Self-pay | Admitting: Urology

## 2024-10-11 DIAGNOSIS — L97811 Non-pressure chronic ulcer of other part of right lower leg limited to breakdown of skin: Secondary | ICD-10-CM | POA: Diagnosis not present

## 2024-10-11 DIAGNOSIS — N179 Acute kidney failure, unspecified: Secondary | ICD-10-CM | POA: Diagnosis not present

## 2024-10-11 DIAGNOSIS — I130 Hypertensive heart and chronic kidney disease with heart failure and stage 1 through stage 4 chronic kidney disease, or unspecified chronic kidney disease: Secondary | ICD-10-CM | POA: Diagnosis not present

## 2024-10-11 DIAGNOSIS — L03115 Cellulitis of right lower limb: Secondary | ICD-10-CM | POA: Diagnosis not present

## 2024-10-11 DIAGNOSIS — I5032 Chronic diastolic (congestive) heart failure: Secondary | ICD-10-CM | POA: Diagnosis not present

## 2024-10-11 DIAGNOSIS — I872 Venous insufficiency (chronic) (peripheral): Secondary | ICD-10-CM | POA: Diagnosis not present

## 2024-10-11 NOTE — Telephone Encounter (Signed)
 Elizabeth from [place] left message patient was prescribed Trospium and there in an interaction with his potassium

## 2024-10-11 NOTE — Telephone Encounter (Signed)
 Per Dr. Sherrilee spoke to [facility] to cut dose in half for Trospium and repeat BMP.

## 2024-10-12 DIAGNOSIS — I82409 Acute embolism and thrombosis of unspecified deep veins of unspecified lower extremity: Secondary | ICD-10-CM | POA: Diagnosis not present

## 2024-10-12 DIAGNOSIS — J449 Chronic obstructive pulmonary disease, unspecified: Secondary | ICD-10-CM | POA: Diagnosis not present

## 2024-10-12 DIAGNOSIS — G4733 Obstructive sleep apnea (adult) (pediatric): Secondary | ICD-10-CM | POA: Diagnosis not present

## 2024-10-12 DIAGNOSIS — N1831 Chronic kidney disease, stage 3a: Secondary | ICD-10-CM | POA: Diagnosis not present

## 2024-10-12 DIAGNOSIS — I10 Essential (primary) hypertension: Secondary | ICD-10-CM | POA: Diagnosis not present

## 2024-10-12 DIAGNOSIS — C61 Malignant neoplasm of prostate: Secondary | ICD-10-CM | POA: Diagnosis not present

## 2024-10-12 DIAGNOSIS — R600 Localized edema: Secondary | ICD-10-CM | POA: Diagnosis not present

## 2024-10-12 DIAGNOSIS — I129 Hypertensive chronic kidney disease with stage 1 through stage 4 chronic kidney disease, or unspecified chronic kidney disease: Secondary | ICD-10-CM | POA: Diagnosis not present

## 2024-10-13 DIAGNOSIS — M542 Cervicalgia: Secondary | ICD-10-CM | POA: Diagnosis not present

## 2024-10-13 DIAGNOSIS — Z6831 Body mass index (BMI) 31.0-31.9, adult: Secondary | ICD-10-CM | POA: Diagnosis not present

## 2024-10-14 DIAGNOSIS — L03115 Cellulitis of right lower limb: Secondary | ICD-10-CM | POA: Diagnosis not present

## 2024-10-14 DIAGNOSIS — E876 Hypokalemia: Secondary | ICD-10-CM | POA: Diagnosis not present

## 2024-10-14 DIAGNOSIS — L97811 Non-pressure chronic ulcer of other part of right lower leg limited to breakdown of skin: Secondary | ICD-10-CM | POA: Diagnosis not present

## 2024-10-14 DIAGNOSIS — N179 Acute kidney failure, unspecified: Secondary | ICD-10-CM | POA: Diagnosis not present

## 2024-10-14 DIAGNOSIS — I872 Venous insufficiency (chronic) (peripheral): Secondary | ICD-10-CM | POA: Diagnosis not present

## 2024-10-14 DIAGNOSIS — I82401 Acute embolism and thrombosis of unspecified deep veins of right lower extremity: Secondary | ICD-10-CM | POA: Diagnosis not present

## 2024-10-14 DIAGNOSIS — I5032 Chronic diastolic (congestive) heart failure: Secondary | ICD-10-CM | POA: Diagnosis not present

## 2024-10-14 DIAGNOSIS — I130 Hypertensive heart and chronic kidney disease with heart failure and stage 1 through stage 4 chronic kidney disease, or unspecified chronic kidney disease: Secondary | ICD-10-CM | POA: Diagnosis not present

## 2024-10-14 DIAGNOSIS — M069 Rheumatoid arthritis, unspecified: Secondary | ICD-10-CM | POA: Diagnosis not present

## 2024-10-18 ENCOUNTER — Encounter: Attending: Rheumatology | Admitting: Physician Assistant

## 2024-10-18 ENCOUNTER — Encounter: Payer: Self-pay | Admitting: Physician Assistant

## 2024-10-18 ENCOUNTER — Other Ambulatory Visit: Payer: Self-pay | Admitting: Physician Assistant

## 2024-10-18 VITALS — BP 161/81 | HR 64 | Temp 98.1°F | Resp 14 | Ht 69.0 in | Wt 214.6 lb

## 2024-10-18 DIAGNOSIS — Z860101 Personal history of adenomatous and serrated colon polyps: Secondary | ICD-10-CM | POA: Insufficient documentation

## 2024-10-18 DIAGNOSIS — A419 Sepsis, unspecified organism: Secondary | ICD-10-CM | POA: Diagnosis not present

## 2024-10-18 DIAGNOSIS — Z8781 Personal history of (healed) traumatic fracture: Secondary | ICD-10-CM | POA: Insufficient documentation

## 2024-10-18 DIAGNOSIS — M810 Age-related osteoporosis without current pathological fracture: Secondary | ICD-10-CM | POA: Diagnosis not present

## 2024-10-18 DIAGNOSIS — M19041 Primary osteoarthritis, right hand: Secondary | ICD-10-CM | POA: Insufficient documentation

## 2024-10-18 DIAGNOSIS — L0390 Cellulitis, unspecified: Secondary | ICD-10-CM | POA: Diagnosis not present

## 2024-10-18 DIAGNOSIS — Z8669 Personal history of other diseases of the nervous system and sense organs: Secondary | ICD-10-CM | POA: Diagnosis not present

## 2024-10-18 DIAGNOSIS — M19071 Primary osteoarthritis, right ankle and foot: Secondary | ICD-10-CM | POA: Diagnosis not present

## 2024-10-18 DIAGNOSIS — M1A09X0 Idiopathic chronic gout, multiple sites, without tophus (tophi): Secondary | ICD-10-CM | POA: Insufficient documentation

## 2024-10-18 DIAGNOSIS — M0579 Rheumatoid arthritis with rheumatoid factor of multiple sites without organ or systems involvement: Secondary | ICD-10-CM | POA: Insufficient documentation

## 2024-10-18 DIAGNOSIS — Z8719 Personal history of other diseases of the digestive system: Secondary | ICD-10-CM | POA: Insufficient documentation

## 2024-10-18 DIAGNOSIS — M170 Bilateral primary osteoarthritis of knee: Secondary | ICD-10-CM | POA: Diagnosis not present

## 2024-10-18 DIAGNOSIS — Z86718 Personal history of other venous thrombosis and embolism: Secondary | ICD-10-CM | POA: Diagnosis not present

## 2024-10-18 DIAGNOSIS — Z79899 Other long term (current) drug therapy: Secondary | ICD-10-CM | POA: Insufficient documentation

## 2024-10-18 DIAGNOSIS — Z8546 Personal history of malignant neoplasm of prostate: Secondary | ICD-10-CM | POA: Insufficient documentation

## 2024-10-18 DIAGNOSIS — Z8709 Personal history of other diseases of the respiratory system: Secondary | ICD-10-CM | POA: Diagnosis not present

## 2024-10-18 DIAGNOSIS — R7989 Other specified abnormal findings of blood chemistry: Secondary | ICD-10-CM | POA: Diagnosis not present

## 2024-10-18 DIAGNOSIS — M19072 Primary osteoarthritis, left ankle and foot: Secondary | ICD-10-CM | POA: Insufficient documentation

## 2024-10-18 DIAGNOSIS — Z8639 Personal history of other endocrine, nutritional and metabolic disease: Secondary | ICD-10-CM | POA: Insufficient documentation

## 2024-10-18 DIAGNOSIS — Z8679 Personal history of other diseases of the circulatory system: Secondary | ICD-10-CM | POA: Diagnosis not present

## 2024-10-18 DIAGNOSIS — M19042 Primary osteoarthritis, left hand: Secondary | ICD-10-CM | POA: Diagnosis not present

## 2024-10-18 MED ORDER — LEFLUNOMIDE 20 MG PO TABS: 20.0000 mg | ORAL_TABLET | Freq: Every day | ORAL | 0 refills | Status: DC

## 2024-10-19 ENCOUNTER — Telehealth: Payer: Self-pay

## 2024-10-19 NOTE — Telephone Encounter (Signed)
 Labs received from:[facility]  Drawn on:10/12/2024  Reviewed by:Waddell Craze, PA-C  Labs drawn:Urinalysis w/ Microscopy, CMP, Magnesium , Phosphorus, CBC, Intact PTH, PR/CR Ratio, Microalb/Creat Ratio, SPEP, Free Light Chains Serum  Results:Protein 1+ Glucose 114 Creatinine 1.38 eGFR 54 Carbon Dioxide 32 Alkaline Phosphatase 159 PTH 75 Free Kappa Lt Chains 41.6 Free Lambda Lt Chains 35

## 2024-10-20 DIAGNOSIS — L03115 Cellulitis of right lower limb: Secondary | ICD-10-CM | POA: Diagnosis not present

## 2024-10-20 DIAGNOSIS — I872 Venous insufficiency (chronic) (peripheral): Secondary | ICD-10-CM | POA: Diagnosis not present

## 2024-10-20 DIAGNOSIS — N179 Acute kidney failure, unspecified: Secondary | ICD-10-CM | POA: Diagnosis not present

## 2024-10-20 DIAGNOSIS — I5032 Chronic diastolic (congestive) heart failure: Secondary | ICD-10-CM | POA: Diagnosis not present

## 2024-10-20 DIAGNOSIS — I130 Hypertensive heart and chronic kidney disease with heart failure and stage 1 through stage 4 chronic kidney disease, or unspecified chronic kidney disease: Secondary | ICD-10-CM | POA: Diagnosis not present

## 2024-10-20 DIAGNOSIS — L97811 Non-pressure chronic ulcer of other part of right lower leg limited to breakdown of skin: Secondary | ICD-10-CM | POA: Diagnosis not present

## 2024-10-21 ENCOUNTER — Other Ambulatory Visit: Payer: Self-pay

## 2024-10-21 DIAGNOSIS — Z79899 Other long term (current) drug therapy: Secondary | ICD-10-CM

## 2024-10-21 NOTE — Telephone Encounter (Signed)
-----   Message from Waddell CHRISTELLA Craze sent at 10/21/2024  7:58 AM EDT ----- Regarding: FW: Restart Arava  Please notify the patient that it is ok to restart Arava  10 mg 1 tablet by mouth daily.  He will need to monitor symptoms closely for any recurrence of infection.   Recommend rechecking CBC and CMP in 2 weeks. ----- Message ----- From: Dea Shiner, MD Sent: 10/21/2024   5:54 AM EDT To: Waddell CHRISTELLA Craze, PA-C Subject: RE: Restart Arava                               That is okay since arava  has low risk for immunosuppression.  ----- Message ----- From: Craze Waddell CHRISTELLA, PA-C Sent: 10/18/2024   4:31 PM EDT To: Shiner Dea, MD Subject: Restart Arava                                   Hi Dr. Dea,   I am reaching out about a mutual patient, [name] hospitalization for sepsis secondary to cellulitis.  He has been under our care for management of rheumatoid arthritis and was previously on IV Actemra  and Arava .  We have discontinued Actemra  and do not plan to resume any Biologics at this time given history of sepsis.  Would like to consider resuming Arava  to manage his symptoms if possible. The cellulitis has cleared with no recurrence.  We would love your recommendations prior to advising him to resume Arava  with close monitoring.    Thank you in advance for helping to coordinate care!  Waddell Craze, PA-C

## 2024-10-21 NOTE — Telephone Encounter (Signed)
 Attempted to contact the patient and left a message to call the office back.

## 2024-10-22 NOTE — Addendum Note (Signed)
 Addended by: Carmalita Wakefield P on: 10/22/2024 11:14 AM   Modules accepted: Orders

## 2024-10-22 NOTE — Telephone Encounter (Signed)
 Patient advised Please notify the patient that it is ok to restart Arava  10 mg 1 tablet by mouth daily.  He will need to monitor symptoms closely for any recurrence of infection.   Recommend rechecking CBC and CMP in 2 weeks. Patient verbalized understanding. Please review and sign.

## 2024-10-25 MED ORDER — LEFLUNOMIDE 10 MG PO TABS: 10.0000 mg | ORAL_TABLET | Freq: Every day | ORAL | 0 refills | Status: AC

## 2024-10-28 ENCOUNTER — Other Ambulatory Visit: Payer: Self-pay | Admitting: Physician Assistant

## 2024-11-05 ENCOUNTER — Telehealth: Payer: Self-pay

## 2024-11-05 NOTE — Telephone Encounter (Signed)
 Order faxed to listed fax number

## 2024-11-15 DIAGNOSIS — Z23 Encounter for immunization: Secondary | ICD-10-CM | POA: Diagnosis not present

## 2024-11-29 DIAGNOSIS — S12301D Unspecified nondisplaced fracture of fourth cervical vertebra, subsequent encounter for fracture with routine healing: Secondary | ICD-10-CM | POA: Diagnosis not present

## 2024-11-29 DIAGNOSIS — M542 Cervicalgia: Secondary | ICD-10-CM | POA: Diagnosis not present

## 2024-11-29 DIAGNOSIS — Z6830 Body mass index (BMI) 30.0-30.9, adult: Secondary | ICD-10-CM | POA: Diagnosis not present

## 2024-12-06 NOTE — Progress Notes (Unsigned)
 Office Visit Note  Patient: [name]             Date of Birth: May 14, 1951           MRN: [id number]             PCP: Marvine Rush, MD Referring: Marvine Rush, MD Visit Date: 12/20/2024 Occupation: Data Unavailable  Subjective:  No chief complaint on file.   History of Present Illness: [name] is a 73 y.o. male ***   Discussed prolia last visit   Activities of Daily Living:  Patient reports morning stiffness for *** {minute/hour:19697}.   Patient {ACTIONS;DENIES/REPORTS:21021675::Denies} nocturnal pain.  Difficulty dressing/grooming: {ACTIONS;DENIES/REPORTS:21021675::Denies} Difficulty climbing stairs: {ACTIONS;DENIES/REPORTS:21021675::Denies} Difficulty getting out of chair: {ACTIONS;DENIES/REPORTS:21021675::Denies} Difficulty using hands for taps, buttons, cutlery, and/or writing: {ACTIONS;DENIES/REPORTS:21021675::Denies}  No Rheumatology ROS completed.   PMFS History:  Patient Active Problem List   Diagnosis Date Noted   Lower extremity edema 09/13/2024   PICC (peripherally inserted central catheter) removal 09/13/2024   Cellulitis of right lower extremity 09/09/2024   Medication management 09/09/2024   MRSA bacteremia 08/20/2024   MRSA infection 08/20/2024   Sepsis (HCC) 08/18/2024   Acute respiratory failure with hypoxia (HCC) 01/23/2023   Stage 3a chronic kidney disease (CKD) (HCC) 03/11/2022   Bronchiectasis without complication (HCC) 03/26/2021   Incontinence when straining, male 03/20/2021   Erectile dysfunction after radical prostatectomy 09/08/2020   OAB (overactive bladder) 02/23/2020   Class 2 severe obesity due to excess calories with serious comorbidity and body mass index (BMI) of 35.0 to 35.9 in adult    Chronic diastolic HF (heart failure) (HCC)    OSA (obstructive sleep apnea) 03/10/2019   Dyspnea on exertion 10/22/2017   History of seizure disorder 02/27/2017   Primary osteoarthritis of both hands 02/27/2017    Primary osteoarthritis of both feet 02/27/2017   Idiopathic gout of multiple sites 02/27/2017   History of prostate cancer 12/29/2016   Primary osteoarthritis of both knees 12/29/2016   Chronic idiopathic gout involving toe without tophus 12/29/2016   History of COPD 12/29/2016   Plantar pustular psoriasis 12/29/2016   DVT (deep venous thrombosis) (HCC) 10/31/2016   COPD with acute exacerbation (HCC) 10/31/2016   CHF (congestive heart failure) (HCC) 10/31/2016   Prostate cancer (HCC) 08/30/2015   Malignant neoplasm of prostate (HCC) 07/04/2015   Chest pain at rest 07/05/2014   Weakness 07/05/2014   Complicated postphlebitic syndrome 11/16/2013   Personal history of DVT (deep vein thrombosis) 05/18/2013   Chronic anticoagulation 05/18/2013   Diverticulosis of colon without hemorrhage 05/18/2013   Rheumatoid arthritis (HCC) 05/18/2013   Seizure disorder (HCC) 05/18/2013   History of colonic polyps 05/18/2013   GERD 05/08/2010    Past Medical History:  Diagnosis Date   Arthritis    RA   Asthma    CHF (congestive heart failure) (HCC)    pt denies    Clotting disorder    DVT both legs    COPD (chronic obstructive pulmonary disease) (HCC)    DDD (degenerative disc disease), cervical    with UE's paresthesias   Diverticulosis    DVT, lower extremity (HCC)    bilat   Eye abnormality    right eye drifts has difficulty focusing with right eye has had since birth    GERD (gastroesophageal reflux disease)    Gout    History of measles    History of shingles    Hypercholesterolemia    IBS (irritable bowel syndrome)    IBS (  irritable bowel syndrome)    Peripheral edema    Pneumonia    hx of    Prostate cancer (HCC)    Rheumatoid arthritis (HCC)    Seizures (HCC)    last seizure 20 years ago; on dilantin . Unknown origin.   Shortness of breath dyspnea    exertion    Sleep apnea    cpap    Tubular adenoma of colon 04/2008    Family History  Problem Relation Age of Onset    Alzheimer's disease Mother    Skin cancer Father    Stomach cancer Father    Heart attack Father    Breast cancer Sister    Heart attack Sister    Stroke Sister    Bladder Cancer Sister    Heart murmur Sister    Esophageal cancer Brother    Throat cancer Brother    Stomach cancer Brother    Lung cancer Brother    Colon cancer Brother    Heart attack Brother    Heart attack Brother    Colon cancer Brother    Colon polyps Neg Hx    Rectal cancer Neg Hx    Past Surgical History:  Procedure Laterality Date   CHOLECYSTECTOMY     CIRCUMCISION     COLONOSCOPY     CYSTOSCOPY WITH LITHOLAPAXY N/A 03/17/2019   Procedure: CYSTOSCOPY WITH LITHOLAPAXY AND REMOVAL OF FOREIGN BODY;  Surgeon: Sherrilee Belvie CROME, MD;  Location: [facility];  Service: Urology;  Laterality: N/A;   DOPPLER ECHOCARDIOGRAPHY N/A 01-21-2012   TECHNICALLY DIFFICULT. MILD CONCENTRIC LV HYPERTROPHY. LV CAVITY IS SMALL.. EF=> 55%. TRANSMITRAL SPECTRAL FLOW PATTREN IS SUGGESTIVE OF IMPAIRED LV RELAXATION. RV SYSTOLIC PRESSURE IS . LEFT ATRIAL SIZE IS NORMAL. AV APPEARS MILDLY SCLEROTIC. NO SIGN VALVE DISEASE NOTED.   LEFT HEART CATH AND CORONARY ANGIOGRAPHY N/A 03/12/2022   Procedure: LEFT HEART CATH AND CORONARY ANGIOGRAPHY;  Surgeon: Wonda Sharper, MD;  Location: [facility] IN[facility] CV [facility];  Service: Cardiovascular;  Laterality: N/A;   LYMPHADENECTOMY Bilateral 08/30/2015   Procedure: PELVIC LYMPHADENECTOMY;  Surgeon: Belvie CROME Sherrilee, MD;  Location: [facility];  Service: Urology;  Laterality: Bilateral;   NUCLEAR STRESS TEST N/A 01-21-2012   NORMAL PATTERN OF PERFUSION IN ALL REGIONS. POST STRESS LV SIZE IS NORMAL. NO EVIDENCE OF INDUCIBLE ISCHEMIA. EF 54%.   POLYPECTOMY     PROSTATE BIOPSY     ROBOT ASSISTED LAPAROSCOPIC RADICAL PROSTATECTOMY N/A 08/30/2015   Procedure: ROBOTIC ASSISTED LAPAROSCOPIC RADICAL PROSTATECTOMY;  Surgeon: Belvie CROME Sherrilee, MD;  Location: [facility];  Service: Urology;  Laterality: N/A;   TEE WITHOUT  CARDIOVERSION N/A 08/23/2024   Procedure: ECHOCARDIOGRAM, TRANSESOPHAGEAL;  Surgeon: Alvan Dorn FALCON, MD;  Location: [facility];  Service: Endoscopy;  Laterality: N/A;   URINARY SPHINCTER IMPLANT N/A 03/20/2021   Procedure: ARTIFICIAL URINARY SPHINCTER CYSTOSCOPY;  Surgeon: Gaston Hamilton, MD;  Location: [facility];  Service: Urology;  Laterality: N/A;  REQUESTING 90 MINS   US  VENOUS LOWER EXT Right 03/07/11   PERSISTENT DVT IN RIGHT LOWER EXT.WITH PERSISTANT VISUALIZATION OF HYPOECHOIC THROMBUS WITHIN THE FEMORAL, PROFUNDA FEMORAL AND POPLITEAL VEINS. WHEN COMPARED TO PREVIOUS, CLOT IS NO LONGER IDENTIFIED WITH IN THE RIGHT CFV.   Social History   Tobacco Use   Smoking status: Former    Current packs/day: 0.00    Average packs/day: 3.0 packs/day for 20.0 years (60.0 ttl pk-yrs)    Types: Cigarettes    Start date: 12/30/1976    Quit date: 12/30/1996    Years  since quitting: 27.9    Passive exposure: Never   Smokeless tobacco: Never  Vaping Use   Vaping status: Never Used  Substance Use Topics   Alcohol use: No    Alcohol/week: 0.0 standard drinks of alcohol   Drug use: No   Social History   Social History Narrative   Not on file     Immunization History  Administered Date(s) Administered   Fluad Quad(high Dose 65+) 09/30/2019   INFLUENZA, HIGH DOSE SEASONAL PF 10/05/2018, 10/10/2021   Influenza,inj,Quad PF,6+ Mos 08/31/2015   Influenza-Unspecified 09/30/2016, 08/22/2017   Moderna Sars-Covid-2 Vaccination 01/21/2020, 02/18/2020, 10/25/2020, 10/10/2021   Pneumococcal Conjugate-13 10/05/2018   Pneumococcal-Unspecified 12/30/2012   Td 07/05/1996     Objective: Vital Signs: There were no vitals taken for this visit.   Physical Exam   Musculoskeletal Exam: ***  CDAI Exam: CDAI Score: -- Patient Global: --; Provider Global: -- Swollen: --; Tender: -- Joint Exam 12/20/2024   No joint exam has been documented for this visit   There is currently no information documented on  the homunculus. Go to the Rheumatology activity and complete the homunculus joint exam.  Investigation: No additional findings.  Imaging: No results found.  Recent Labs: Lab Results  Component Value Date   WBC 7.0 08/23/2024   HGB 13.7 08/23/2024   PLT 161 08/23/2024   NA 141 08/23/2024   K 4.2 08/23/2024   CL 108 08/23/2024   CO2 27 08/23/2024   GLUCOSE 108 (H) 08/23/2024   BUN 16 08/23/2024   CREATININE 1.23 08/23/2024   BILITOT 1.0 08/18/2024   ALKPHOS 58 08/18/2024   AST 29 08/18/2024   ALT 22 08/18/2024   PROT 6.9 08/18/2024   ALBUMIN 3.9 08/18/2024   CALCIUM  9.0 08/23/2024   GFRAA >60 09/20/2020   QFTBGOLDPLUS Negative 02/02/2024    Speciality Comments: ACTEMRA  4mg /kg x 4 weeks PPD negative 01/02/17  Procedures:  No procedures performed Allergies: Orencia  [abatacept ], Carbamazepine, Celecoxib, Cephalexin, Enbrel [etanercept], Humira [adalimumab], Levofloxacin , Sulfa antibiotics, and Sulfasalazine   Assessment / Plan:     Visit Diagnoses: Rheumatoid arthritis involving multiple sites with positive rheumatoid factor (HCC)  High risk medication use  Elevated serum creatinine  History of hyperlipidemia  Age-related osteoporosis without current pathological fracture  Idiopathic chronic gout of multiple sites without tophus  Primary osteoarthritis of both hands  Primary osteoarthritis of both knees  Primary osteoarthritis of both feet  History of abscessed tooth  History of COPD  History of adenomatous polyp of colon  History of seizure disorder  History of CHF (congestive heart failure)  History of prostate cancer  History of DVT (deep vein thrombosis)  Sepsis due to cellulitis (HCC)  History of vertebral fracture  Orders: No orders of the defined types were placed in this encounter.  No orders of the defined types were placed in this encounter.   Face-to-face time spent with patient was *** minutes. Greater than 50% of time was spent  in counseling and coordination of care.  Follow-Up Instructions: No follow-ups on file.   Waddell CHRISTELLA Craze, PA-C  Note - This record has been created using Dragon software.  Chart creation errors have been sought, but may not always  have been located. Such creation errors do not reflect on  the standard of medical care.

## 2024-12-20 ENCOUNTER — Telehealth: Payer: Self-pay | Admitting: Pharmacist

## 2024-12-20 ENCOUNTER — Encounter: Payer: Self-pay | Admitting: Physician Assistant

## 2024-12-20 ENCOUNTER — Encounter: Attending: Rheumatology | Admitting: Physician Assistant

## 2024-12-20 VITALS — BP 145/72 | HR 77 | Temp 97.8°F | Resp 16 | Ht 69.0 in | Wt 215.4 lb

## 2024-12-20 DIAGNOSIS — Z79899 Other long term (current) drug therapy: Secondary | ICD-10-CM | POA: Insufficient documentation

## 2024-12-20 DIAGNOSIS — Z86718 Personal history of other venous thrombosis and embolism: Secondary | ICD-10-CM | POA: Diagnosis present

## 2024-12-20 DIAGNOSIS — M810 Age-related osteoporosis without current pathological fracture: Secondary | ICD-10-CM | POA: Diagnosis not present

## 2024-12-20 DIAGNOSIS — Z8709 Personal history of other diseases of the respiratory system: Secondary | ICD-10-CM | POA: Insufficient documentation

## 2024-12-20 DIAGNOSIS — Z8639 Personal history of other endocrine, nutritional and metabolic disease: Secondary | ICD-10-CM | POA: Insufficient documentation

## 2024-12-20 DIAGNOSIS — M19042 Primary osteoarthritis, left hand: Secondary | ICD-10-CM | POA: Insufficient documentation

## 2024-12-20 DIAGNOSIS — E559 Vitamin D deficiency, unspecified: Secondary | ICD-10-CM | POA: Insufficient documentation

## 2024-12-20 DIAGNOSIS — M170 Bilateral primary osteoarthritis of knee: Secondary | ICD-10-CM | POA: Insufficient documentation

## 2024-12-20 DIAGNOSIS — R7989 Other specified abnormal findings of blood chemistry: Secondary | ICD-10-CM | POA: Insufficient documentation

## 2024-12-20 DIAGNOSIS — Z8679 Personal history of other diseases of the circulatory system: Secondary | ICD-10-CM | POA: Insufficient documentation

## 2024-12-20 DIAGNOSIS — M19041 Primary osteoarthritis, right hand: Secondary | ICD-10-CM | POA: Insufficient documentation

## 2024-12-20 DIAGNOSIS — M19071 Primary osteoarthritis, right ankle and foot: Secondary | ICD-10-CM | POA: Diagnosis not present

## 2024-12-20 DIAGNOSIS — Z8669 Personal history of other diseases of the nervous system and sense organs: Secondary | ICD-10-CM | POA: Diagnosis present

## 2024-12-20 DIAGNOSIS — Z8719 Personal history of other diseases of the digestive system: Secondary | ICD-10-CM | POA: Diagnosis not present

## 2024-12-20 DIAGNOSIS — L0390 Cellulitis, unspecified: Secondary | ICD-10-CM | POA: Insufficient documentation

## 2024-12-20 DIAGNOSIS — M0579 Rheumatoid arthritis with rheumatoid factor of multiple sites without organ or systems involvement: Secondary | ICD-10-CM | POA: Diagnosis not present

## 2024-12-20 DIAGNOSIS — Z8781 Personal history of (healed) traumatic fracture: Secondary | ICD-10-CM | POA: Diagnosis not present

## 2024-12-20 DIAGNOSIS — M19072 Primary osteoarthritis, left ankle and foot: Secondary | ICD-10-CM | POA: Diagnosis present

## 2024-12-20 DIAGNOSIS — A419 Sepsis, unspecified organism: Secondary | ICD-10-CM | POA: Insufficient documentation

## 2024-12-20 DIAGNOSIS — Z860101 Personal history of adenomatous and serrated colon polyps: Secondary | ICD-10-CM | POA: Insufficient documentation

## 2024-12-20 DIAGNOSIS — M1A09X0 Idiopathic chronic gout, multiple sites, without tophus (tophi): Secondary | ICD-10-CM | POA: Diagnosis not present

## 2024-12-20 DIAGNOSIS — Z8546 Personal history of malignant neoplasm of prostate: Secondary | ICD-10-CM | POA: Diagnosis present

## 2024-12-20 NOTE — Patient Instructions (Signed)
 Denosumab Injection (Osteoporosis) What is this medication? DENOSUMAB (den oh SUE mab) prevents and treats osteoporosis. It works by Interior and spatial designer stronger and less likely to break (fracture). It is a monoclonal antibody. This medicine may be used for other purposes; ask your health care provider or pharmacist if you have questions. COMMON BRAND NAME(S): Prolia What should I tell my care team before I take this medication? They need to know if you have any of these conditions: Dental or gum disease Had thyroid or parathyroid (glands located in neck) surgery Having dental surgery or a tooth pulled Kidney disease Low levels of calcium in the blood On dialysis Poor nutrition Thyroid disease Trouble absorbing nutrients from your food An unusual or allergic reaction to denosumab, other medications, foods, dyes, or preservatives Pregnant or trying to get pregnant Breastfeeding How should I use this medication? This medication is injected under the skin. It is given by your care team in a hospital or clinic setting. A special MedGuide will be given to you before each treatment. Be sure to read this information carefully each time. Talk to your care team about the use of this medication in children. Special care may be needed. Overdosage: If you think you have taken too much of this medicine contact a poison control center or emergency room at once. NOTE: This medicine is only for you. Do not share this medicine with others. What if I miss a dose? Keep appointments for follow-up doses. It is important not to miss your dose. Call your care team if you are unable to keep an appointment. What may interact with this medication? Do not take this medication with any of the following: Other medications that contain denosumab This medication may also interact with the following: Medications that lower your chance of fighting infection Steroid medications, such as prednisone or cortisone This  list may not describe all possible interactions. Give your health care provider a list of all the medicines, herbs, non-prescription drugs, or dietary supplements you use. Also tell them if you smoke, drink alcohol, or use illegal drugs. Some items may interact with your medicine. What should I watch for while using this medication? Your condition will be monitored carefully while you are receiving this medication. You may need blood work done while taking this medication. This medication may increase your risk of getting an infection. Call your care team for advice if you get a fever, chills, sore throat, or other symptoms of a cold or flu. Do not treat yourself. Try to avoid being around people who are sick. Tell your dentist and dental surgeon that you are taking this medication. You should not have major dental surgery while on this medication. See your dentist to have a dental exam and fix any dental problems before starting this medication. Take good care of your teeth while on this medication. Make sure you see your dentist for regular follow-up appointments. This medication may cause low levels of calcium in your body. The risk of severe side effects is increased in people with kidney disease. Your care team may prescribe calcium and vitamin D to help prevent low calcium levels while you take this medication. It is important to take calcium and vitamin D as directed by your care team. Talk to your care team if you may be pregnant. Serious birth defects may occur if you take this medication during pregnancy and for 5 months after the last dose. You will need a negative pregnancy test before starting this medication. Contraception  is recommended while taking this medication and for 5 months after the last dose. Your care team can help you find the option that works for you. Talk to your care team before breastfeeding. Changes to your treatment plan may be needed. What side effects may I notice from  receiving this medication? Side effects that you should report to your care team as soon as possible: Allergic reactions--skin rash, itching, hives, swelling of the face, lips, tongue, or throat Infection--fever, chills, cough, sore throat, wounds that don't heal, pain or trouble when passing urine, general feeling of discomfort or being unwell Low calcium level--muscle pain or cramps, confusion, tingling, or numbness in the hands or feet Osteonecrosis of the jaw--pain, swelling, or redness in the mouth, numbness of the jaw, poor healing after dental work, unusual discharge from the mouth, visible bones in the mouth Severe bone, joint, or muscle pain Skin infection--skin redness, swelling, warmth, or pain Side effects that usually do not require medical attention (report these to your care team if they continue or are bothersome): Back pain Headache Joint pain Muscle pain Pain in the hands, arms, legs, or feet Runny or stuffy nose Sore throat This list may not describe all possible side effects. Call your doctor for medical advice about side effects. You may report side effects to FDA at 1-800-FDA-1088. Where should I keep my medication? This medication is given in a hospital or clinic. It will not be stored at home. NOTE: This sheet is a summary. It may not cover all possible information. If you have questions about this medicine, talk to your doctor, pharmacist, or health care provider.  2024 Elsevier/Gold Standard (2023-01-21 00:00:00)

## 2024-12-20 NOTE — Telephone Encounter (Signed)
 Referral for Prolia placed to [facility]. DEXA updated on 02/19/2023: LFN BMD 0.632 with T-score -2.2.  Statistically significant increase left hip.  Lumbar and hip stable. Recent C4 vertebral fracture-wearing cervical spinal collar.  He will have 100% coverage with Medicare + supplemental plan  Sherry Pennant, PharmD, MPH, BCPS, CPP Clinical Pharmacist

## 2024-12-21 ENCOUNTER — Ambulatory Visit: Payer: Self-pay | Admitting: Physician Assistant

## 2024-12-21 ENCOUNTER — Telehealth (HOSPITAL_COMMUNITY): Payer: Self-pay | Admitting: Pharmacy Technician

## 2024-12-21 ENCOUNTER — Encounter (HOSPITAL_COMMUNITY): Payer: Self-pay

## 2024-12-21 LAB — CBC WITH DIFFERENTIAL/PLATELET
Absolute Lymphocytes: 1225 {cells}/uL (ref 850–3900)
Absolute Monocytes: 1006 {cells}/uL — ABNORMAL HIGH (ref 200–950)
Basophils Absolute: 62 {cells}/uL (ref 0–200)
Basophils Relative: 0.8 %
Eosinophils Absolute: 289 {cells}/uL (ref 15–500)
Eosinophils Relative: 3.7 %
HCT: 41.8 % (ref 39.4–51.1)
Hemoglobin: 13.2 g/dL (ref 13.2–17.1)
MCH: 27.6 pg (ref 27.0–33.0)
MCHC: 31.6 g/dL (ref 31.6–35.4)
MCV: 87.4 fL (ref 81.4–101.7)
MPV: 10.9 fL (ref 7.5–12.5)
Monocytes Relative: 12.9 %
Neutro Abs: 5218 {cells}/uL (ref 1500–7800)
Neutrophils Relative %: 66.9 %
Platelets: 242 Thousand/uL (ref 140–400)
RBC: 4.78 Million/uL (ref 4.20–5.80)
RDW: 13.9 % (ref 11.0–15.0)
Total Lymphocyte: 15.7 %
WBC: 7.8 Thousand/uL (ref 3.8–10.8)

## 2024-12-21 LAB — COMPREHENSIVE METABOLIC PANEL WITH GFR
AG Ratio: 1.4 (calc) (ref 1.0–2.5)
ALT: 14 U/L (ref 9–46)
AST: 17 U/L (ref 10–35)
Albumin: 3.9 g/dL (ref 3.6–5.1)
Alkaline phosphatase (APISO): 100 U/L (ref 35–144)
BUN/Creatinine Ratio: 13 (calc) (ref 6–22)
BUN: 19 mg/dL (ref 7–25)
CO2: 31 mmol/L (ref 20–32)
Calcium: 9.4 mg/dL (ref 8.6–10.3)
Chloride: 104 mmol/L (ref 98–110)
Creat: 1.48 mg/dL — ABNORMAL HIGH (ref 0.70–1.28)
Globulin: 2.8 g/dL (ref 1.9–3.7)
Glucose, Bld: 97 mg/dL (ref 65–99)
Potassium: 4.5 mmol/L (ref 3.5–5.3)
Sodium: 143 mmol/L (ref 135–146)
Total Bilirubin: 0.4 mg/dL (ref 0.2–1.2)
Total Protein: 6.7 g/dL (ref 6.1–8.1)
eGFR: 49 mL/min/1.73m2 — ABNORMAL LOW

## 2024-12-21 LAB — URIC ACID: Uric Acid, Serum: 4.5 mg/dL (ref 4.0–8.0)

## 2024-12-21 LAB — VITAMIN D 25 HYDROXY (VIT D DEFICIENCY, FRACTURES): Vit D, 25-Hydroxy: 48 ng/mL (ref 30–100)

## 2024-12-21 NOTE — Progress Notes (Signed)
 Absolute monocytes are slightly elevated, rest of CBC WNL.   Creatinine remains elevated-1.48 and gFR is low-49--avoid NSAIDs.  May be related to lasix . Forward results to nephrology. Rest of CMP WNL. Vitamin D  WNL Uric acid WNL

## 2024-12-21 NOTE — Telephone Encounter (Signed)
 Auth Submission: NO AUTH NEEDED Site of care: [facility] Payer: Medicare A/B, [employer]   Medication & CPT/J Code(s) submitted: Prolia (Denosumab) [id number] Diagnosis Code: M81.0 Route of submission (phone, fax, portal):  Phone # Fax # Auth type: Buy/Bill HB Units/visits requested: 60mg  x 2 doses, q 6 months Reference number:  Approval from: 12/21/2024 to 12/29/25   Medicare A/B will cover 80%, [employer] will cover remaining 20%. Med will be covered at 100%.    Tearah Saulsbury, CPhT [facility] Phone: [phone] 12/21/2024

## 2024-12-28 ENCOUNTER — Other Ambulatory Visit: Payer: Self-pay

## 2024-12-28 DIAGNOSIS — I872 Venous insufficiency (chronic) (peripheral): Secondary | ICD-10-CM

## 2025-01-13 ENCOUNTER — Encounter (HOSPITAL_COMMUNITY)

## 2025-01-26 ENCOUNTER — Ambulatory Visit: Admitting: Physician Assistant

## 2025-01-26 ENCOUNTER — Ambulatory Visit (HOSPITAL_COMMUNITY)
Admission: RE | Admit: 2025-01-26 | Discharge: 2025-01-26 | Disposition: A | Discharge status: Home / Self Care | Source: Ambulatory Visit | Attending: Vascular Surgery | Admitting: Vascular Surgery

## 2025-01-26 VITALS — BP 150/79 | HR 70 | Temp 98.5°F | Resp 22 | Ht 69.0 in | Wt 208.6 lb

## 2025-01-26 DIAGNOSIS — I872 Venous insufficiency (chronic) (peripheral): Secondary | ICD-10-CM | POA: Diagnosis not present

## 2025-01-27 NOTE — Progress Notes (Signed)
 " Office Note     CC:  follow up Requesting Provider:  Marvine Rush, MD  HPI: [name] is a 74 y.o. (12-29-1951) male who presents for evaluation of bilateral lower extremity edema right worse than left.  He has history of DVT and both legs.  He is compliant on Coumadin .  Last year after a traumatic injury to the right shin he developed cellulitis and was hospitalized to treat sepsis.  He has since been strict with his use of daily compression socks.  He also tries to elevate his legs periodically during the day.  He has recently lost 20 pounds which has also helped with his swelling.  He is a former smoker.  He is here today with his wife.   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 Unknown origin.   Shortness of breath dyspnea    exertion    Sleep apnea    cpap    Tubular adenoma of colon 04/2008    Past Surgical History:  Procedure Laterality Date   CHOLECYSTECTOMY     CIRCUMCISION     COLONOSCOPY     CYSTOSCOPY WITH LITHOLAPAXY N/A 03/17/2019   Procedure: CYSTOSCOPY WITH LITHOLAPAXY AND REMOVAL OF FOREIGN BODY;  Surgeon: Sherrilee Belvie CROME, MD;  Location: [facility];  Service: Urology;  Laterality: N/A;   DOPPLER ECHOCARDIOGRAPHY  N/A 01-21-2012   TECHNICALLY DIFFICULT. MILD CONCENTRIC LV HYPERTROPHY. LV CAVITY IS SMALL.. EF=> 55%. TRANSMITRAL SPECTRAL FLOW PATTREN IS SUGGESTIVE OF IMPAIRED LV RELAXATION. RV SYSTOLIC PRESSURE IS . LEFT ATRIAL SIZE IS NORMAL. AV APPEARS MILDLY SCLEROTIC. NO SIGN VALVE DISEASE NOTED.   LEFT HEART CATH AND CORONARY ANGIOGRAPHY N/A 03/12/2022   Procedure: LEFT HEART CATH AND CORONARY ANGIOGRAPHY;  Surgeon: Wonda Sharper, MD;  Location: [facility] INVASIVE CV [facility];  Service: Cardiovascular;  Laterality: N/A;   LYMPHADENECTOMY Bilateral 08/30/2015   Procedure: PELVIC LYMPHADENECTOMY;  Surgeon: Belvie CROME Sherrilee, MD;  Location: [facility];  Service: Urology;  Laterality: Bilateral;   NUCLEAR STRESS TEST N/A 01-21-2012   NORMAL PATTERN OF PERFUSION IN ALL REGIONS. POST STRESS LV SIZE IS NORMAL. NO EVIDENCE OF INDUCIBLE ISCHEMIA. EF 54%.   POLYPECTOMY     PROSTATE BIOPSY     ROBOT ASSISTED LAPAROSCOPIC RADICAL PROSTATECTOMY N/A 08/30/2015   Procedure: ROBOTIC ASSISTED LAPAROSCOPIC RADICAL PROSTATECTOMY;  Surgeon: Belvie CROME Sherrilee, MD;  Location: [facility];  Service: Urology;  Laterality: N/A;   TEE WITHOUT CARDIOVERSION N/A 08/23/2024   Procedure: ECHOCARDIOGRAM, TRANSESOPHAGEAL;  Surgeon: Alvan Dorn FALCON,  MD;  Location: [facility];  Service: Endoscopy;  Laterality: N/A;   URINARY SPHINCTER IMPLANT N/A 03/20/2021   Procedure: ARTIFICIAL URINARY SPHINCTER CYSTOSCOPY;  Surgeon: Gaston Hamilton, MD;  Location: [facility];  Service: Urology;  Laterality: N/A;  REQUESTING 90 MINS   US  VENOUS LOWER EXT Right 03/07/11   PERSISTENT DVT IN RIGHT LOWER EXT.WITH PERSISTANT VISUALIZATION OF HYPOECHOIC THROMBUS WITHIN THE FEMORAL, PROFUNDA FEMORAL AND POPLITEAL VEINS. WHEN COMPARED TO PREVIOUS, CLOT IS NO LONGER IDENTIFIED WITH IN THE RIGHT CFV.    Social History   Socioeconomic History   Marital status: Married    Spouse name: Not on file   Number of children: 3   Years of education: Not on file   Highest education  level: Not on file  Occupational History   Occupation: retired    Associate Professor: [employer]  Tobacco Use   Smoking status: Former    Current packs/day: 0.00    Average packs/day: 3.0 packs/day for 20.0 years (60.0 ttl pk-yrs)    Types: Cigarettes    Start date: 12/30/1976    Quit date: 12/30/1996    Years since quitting: 28.0    Passive exposure: Never   Smokeless tobacco: Never  Vaping Use   Vaping status: Never Used  Substance and Sexual Activity   Alcohol use: No    Alcohol/week: 0.0 standard drinks of alcohol   Drug use: No   Sexual activity: Yes  Other Topics Concern   Not on file  Social History Narrative   Not on file   Social Drivers of Health   Tobacco Use: Medium Risk (01/26/2025)   Patient History    Smoking Tobacco Use: Former    Smokeless Tobacco Use: Never    Passive Exposure: Never  Physicist, Medical Strain: Low Risk (12/16/2022)   Overall Financial Resource Strain (CARDIA)    Difficulty of Paying Living Expenses: Not very hard  Food Insecurity: No Food Insecurity (08/24/2024)   Epic    Worried About Radiation Protection Practitioner of Food in the Last Year: Never true    Ran Out of Food in the Last Year: Never true  Transportation Needs: No Transportation Needs (08/24/2024)   Epic    Lack of Transportation (Medical): No    Lack of Transportation (Non-Medical): No  Physical Activity: Not on file  Stress: Not on file  Social Connections: Moderately Integrated (08/19/2024)   Social Connection and Isolation Panel    Frequency of Communication with Friends and Family: More than three times a week    Frequency of Social Gatherings with Friends and Family: Once a week    Attends Religious Services: More than 4 times per year    Active Member of [employer] or Organizations: No    Attends Banker Meetings: Never    Marital Status: Married  Catering Manager Violence: Not At Risk (08/24/2024)   Epic    Fear of Current or Ex-Partner: No    Emotionally Abused: No    Physically  Abused: No    Sexually Abused: No  Depression (PHQ2-9): Low Risk (08/24/2024)   Depression (PHQ2-9)    PHQ-2 Score: 0  Alcohol Screen: Not on file  Housing: Unknown (08/24/2024)   Epic    Unable to Pay for Housing in the Last Year: No    Number of Times Moved in the Last Year: Not on file    Homeless in the Last Year: No  Utilities: Not At Risk (08/24/2024)   Epic    Threatened with loss of utilities:  No  Health Literacy: Not on file    Family History  Problem Relation Age of Onset   Alzheimer's disease Mother    Skin cancer Father    Stomach cancer Father    Heart attack Father    Breast cancer Sister    Heart attack Sister    Stroke Sister    Bladder Cancer Sister    Heart murmur Sister    Esophageal cancer Brother    Throat cancer Brother    Stomach cancer Brother    Lung cancer Brother    Colon cancer Brother    Heart attack Brother    Heart attack Brother    Colon cancer Brother    Colon polyps Neg Hx    Rectal cancer Neg Hx     Current Outpatient Medications  Medication Sig Dispense Refill   allopurinol  (ZYLOPRIM ) 100 MG tablet Take 1 tablet (100 mg total) by mouth 2 (two) times daily. 180 tablet 0   amLODipine  (NORVASC ) 10 MG tablet Take 1 tablet (10 mg total) by mouth daily. 30 tablet 1   ascorbic acid (VITAMIN C) 500 MG tablet Take 500 mg by mouth at bedtime.     Budeson-Glycopyrrol-Formoterol  (BREZTRI  AEROSPHERE) 160-9-4.8 MCG/ACT AERO Inhale 2 puffs into the lungs in the morning. 10.7 g 1   cholecalciferol (VITAMIN D3) 25 MCG (1000 UNIT) tablet Take 1,000 Units by mouth at bedtime.     dicyclomine  (BENTYL ) 10 MG capsule TAKE 1 CAPSULE BY MOUTH 4 TIMES DAILY BEFORE MEALS AND AT BEDTIME 120 capsule 0   DM-Doxylamine-Acetaminophen  (NYQUIL COLD & FLU PO) Take by mouth as needed.     furosemide  (LASIX ) 80 MG tablet TAKE 1 TABLET BY MOUTH EVERY DAY 30 tablet 3   leflunomide  (ARAVA ) 10 MG tablet Take 1 tablet (10 mg total) by mouth daily. 30 tablet 0   lidocaine   (LIDODERM ) 5 % Place 1 patch onto the skin daily as needed. Remove & Discard patch within 12 hours or as directed by MD 15 patch 0   mupirocin  ointment (BACTROBAN ) 2 % Apply topically 3 (three) times daily. 22 g 0   omeprazole  (PRILOSEC) 40 MG capsule Take 1 capsule (40 mg total) by mouth daily. 30 capsule 2   phenytoin  (DILANTIN ) 100 MG ER capsule Take 100-200 mg by mouth 2 (two) times daily. Take 1 capsule in the morning and 2 capsules at bedtime     Potassium Chloride  ER 20 MEQ TBCR Take 20 mEq by mouth daily.     rosuvastatin  (CRESTOR ) 10 MG tablet Take 10 mg by mouth at bedtime.     spironolactone  (ALDACTONE ) 50 MG tablet Take 1 tablet (50 mg total) by mouth daily. 90 tablet 0   Trospium  Chloride 60 MG CP24 Take 1 capsule (60 mg total) by mouth daily. 30 capsule 11   warfarin (COUMADIN ) 4 MG tablet Take 4 mg by mouth at bedtime.     No current facility-administered medications for this visit.    Allergies[1]   REVIEW OF SYSTEMS:  Negative unless noted in HPI [X]  denotes positive finding, [ ]  denotes negative finding Cardiac  Comments:  Chest pain or chest pressure:    Shortness of breath upon exertion:    Short of breath when lying flat:    Irregular heart rhythm:        Vascular    Pain in calf, thigh, or hip brought on by ambulation:    Pain in feet at night that wakes you up from your sleep:  Blood clot in your veins:    Leg swelling:         Pulmonary    Oxygen  at home:    Productive cough:     Wheezing:         Neurologic    Sudden weakness in arms or legs:     Sudden numbness in arms or legs:     Sudden onset of difficulty speaking or slurred speech:    Temporary loss of vision in one eye:     Problems with dizziness:         Gastrointestinal    Blood in stool:     Vomited blood:         Genitourinary    Burning when urinating:     Blood in urine:        Psychiatric    Major depression:         Hematologic    Bleeding problems:    Problems with  blood clotting too easily:        Skin    Rashes or ulcers:        Constitutional    Fever or chills:      PHYSICAL EXAMINATION:  Vitals:   01/26/25 1437  BP: (!) 150/79  Pulse: 70  Resp: (!) 22  Temp: 98.5 F (36.9 C)  TempSrc: Temporal  SpO2: 95%  Weight: 208 lb 9.6 oz (94.6 kg)  Height: 5' 9 (1.753 m)    General:  WDWN in NAD; vital signs documented above Gait: Not observed HENT: WNL, normocephalic Pulmonary: normal non-labored breathing Cardiac: regular HR Abdomen: soft, NT, no masses Skin: without rashes Vascular Exam/Pulses: palpable L PT; R DP Extremities: No significant edema right or left leg; significant stasis pigmentation changes bilateral lower legs with dry cracking skin bilaterally; no open wounds Musculoskeletal: no muscle wasting or atrophy  Neurologic: A&O X 3 Psychiatric:  The pt has Normal affect.   Non-Invasive Vascular Imaging:   Venous Reflux Times  +--------------------+---------+------+----------+------------+------------  ----+  RIGHT              Reflux NoReflux  Reflux  Diameter cmsComments                                         Yes     Time                                  +--------------------+---------+------+----------+------------+------------  ----+  CFV                          yes  >1 second                                +--------------------+---------+------+----------+------------+------------  ----+  FV prox                       yes  >1 second             chronic  thrombus  +--------------------+---------+------+----------+------------+------------  ----+  FV mid                        yes  >1 second  chronic  thrombus  +--------------------+---------+------+----------+------------+------------  ----+  FV dist                                                  chronic  thrombus  +--------------------+---------+------+----------+------------+------------  ----+   Popliteal                    yes  >1 second             chronic  thrombus  +--------------------+---------+------+----------+------------+------------  ----+  GSV at [facility]          no                           .62                        +--------------------+---------+------+----------+------------+------------  ----+  GSV prox thigh      no                           .77                        +--------------------+---------+------+----------+------------+------------  ----+  GSV mid thigh       no                           .55                        +--------------------+---------+------+----------+------------+------------  ----+  GSV dist thigh      no                           .57                        +--------------------+---------+------+----------+------------+------------  ----+  GSV at knee         no                           .51                        +--------------------+---------+------+----------+------------+------------  ----+  GSV prox calf       no                           .45                        +--------------------+---------+------+----------+------------+------------  ----+  GSV mid calf        no                           .38                        +--------------------+---------+------+----------+------------+------------  ----+  GSV dist calf       no                           .  45                        +--------------------+---------+------+----------+------------+------------  ----+  SSV at [facility]          no                           .49                        +--------------------+---------+------+----------+------------+------------  ----+  SSV prox calf                 yes   >500 ms      .48                        +--------------------+---------+------+----------+------------+------------  ----+  SSV mid calf                  yes   >500 ms      .34                         +--------------------+---------+------+----------+------------+------------  ----+  Varicosities at kneeno                         .41/.45                        ASSESSMENT/PLAN:: 73 y.o. male here for evaluation of bilateral lower extremity edema  Mr. Seufert is a 75 year old male with history of DVT in each of his legs.  He is maintained on Coumadin  and remains compliant.  Since being hospitalized with sepsis related to cellulitis of a venous ulceration he has been compliant with compression socks.  He wears compression regularly and elevates his legs periodically during the day.  Right lower extremity venous reflux study was negative for acute DVT.  There was evidence of nonocclusive thrombus in one of his paired femoral veins.  He does have an incompetent deep system.  He also has an incompetent small saphenous vein.  He needs to focus on management of edema to prevent post embolic syndrome.  Continue regular use of compression and leg elevation during the day.  Encouraged him to walk for exercise to help promote venous return.  I also recommended he use moisturizer to help with dry cracking skin of bilateral lower extremities.  He can follow-up on an as-needed basis.   Donnice Sender, PA-C [facility] [phone]  Clinic MD:   Sheree     [1]  Allergies Allergen Reactions   Abatacept  Anaphylaxis    Had prostate cancer  abatacept    Adalimumab Rash and Dermatitis   Carbamazepine Rash and Dermatitis    Makes drowsy  Brand name is Tegretol  carbamazepine   Celecoxib Itching, Rash and Dermatitis    Brand name is Celebrex  celecoxib   Cephalexin Rash and Dermatitis    Severe Rash.  Brand name is Keflex  Patient has tolerated ceftriaxone  05/2019  Other Reaction(s): Unknown  cephalexin   Doxycycline  Other (See Comments)    GI upset  Other Reaction(s): other  doxycycline    Etanercept Rash and Dermatitis   Levofloxacin  Other  (See Comments)    GI Intolerance  Brand name is Levaquin   Other Reaction(s): other  levofloxacin    Sulfa Antibiotics Rash    Other Reaction(s): Unknown  Sulfasalazine Rash and Dermatitis   "

## 2025-03-24 ENCOUNTER — Encounter: Attending: Rheumatology | Admitting: Physician Assistant

## 2025-10-05 ENCOUNTER — Other Ambulatory Visit

## 2025-10-12 ENCOUNTER — Ambulatory Visit: Admitting: Urology
